# Patient Record
Sex: Female | Born: 1950 | Race: White | Hispanic: No | Marital: Married | Smoking: Never smoker
Health system: Southern US, Community
[De-identification: ages and names within clinical notes are randomized; demographics above are authoritative.]

## PROBLEM LIST (undated history)

## (undated) DIAGNOSIS — I10 Essential (primary) hypertension: Secondary | ICD-10-CM

## (undated) DIAGNOSIS — N189 Chronic kidney disease, unspecified: Secondary | ICD-10-CM

## (undated) DIAGNOSIS — I639 Cerebral infarction, unspecified: Secondary | ICD-10-CM

## (undated) HISTORY — PX: HAMMER TOE SURGERY: SHX385

## (undated) HISTORY — PX: KNEE ARTHROSCOPY: SUR90

## (undated) HISTORY — PX: TONSILLECTOMY: SUR1361

## (undated) HISTORY — PX: ABDOMINAL HYSTERECTOMY: SHX81

## (undated) HISTORY — PX: CHOLECYSTECTOMY: SHX55

---

## 2004-12-09 ENCOUNTER — Emergency Department: Payer: Self-pay | Admitting: Emergency Medicine

## 2005-06-28 ENCOUNTER — Ambulatory Visit: Payer: Self-pay | Admitting: Internal Medicine

## 2006-05-05 ENCOUNTER — Ambulatory Visit: Payer: Self-pay | Admitting: Internal Medicine

## 2006-07-03 ENCOUNTER — Ambulatory Visit: Payer: Self-pay | Admitting: Internal Medicine

## 2007-11-24 ENCOUNTER — Ambulatory Visit: Payer: Self-pay | Admitting: Internal Medicine

## 2009-03-04 ENCOUNTER — Inpatient Hospital Stay: Payer: Self-pay | Admitting: Internal Medicine

## 2009-03-16 ENCOUNTER — Ambulatory Visit: Payer: Self-pay | Admitting: Internal Medicine

## 2009-03-28 ENCOUNTER — Ambulatory Visit: Payer: Self-pay | Admitting: Urology

## 2009-07-05 ENCOUNTER — Ambulatory Visit: Payer: Self-pay | Admitting: Internal Medicine

## 2009-08-01 ENCOUNTER — Ambulatory Visit: Payer: Self-pay | Admitting: Internal Medicine

## 2009-11-14 ENCOUNTER — Ambulatory Visit: Payer: Self-pay | Admitting: Ophthalmology

## 2009-11-28 ENCOUNTER — Ambulatory Visit: Payer: Self-pay | Admitting: Ophthalmology

## 2010-04-11 ENCOUNTER — Ambulatory Visit: Payer: Self-pay | Admitting: Internal Medicine

## 2010-04-16 ENCOUNTER — Ambulatory Visit: Payer: Self-pay | Admitting: Internal Medicine

## 2010-04-19 ENCOUNTER — Ambulatory Visit: Payer: Self-pay | Admitting: Internal Medicine

## 2010-04-26 ENCOUNTER — Ambulatory Visit: Payer: Self-pay | Admitting: Internal Medicine

## 2010-05-03 ENCOUNTER — Ambulatory Visit: Payer: Self-pay | Admitting: Internal Medicine

## 2010-05-10 ENCOUNTER — Ambulatory Visit: Payer: Self-pay | Admitting: Internal Medicine

## 2010-05-17 ENCOUNTER — Ambulatory Visit: Payer: Self-pay | Admitting: Internal Medicine

## 2010-05-24 ENCOUNTER — Ambulatory Visit: Payer: Self-pay | Admitting: Internal Medicine

## 2010-05-31 ENCOUNTER — Ambulatory Visit: Payer: Self-pay | Admitting: Internal Medicine

## 2010-06-07 ENCOUNTER — Ambulatory Visit: Payer: Self-pay | Admitting: Internal Medicine

## 2010-06-13 ENCOUNTER — Ambulatory Visit: Payer: Self-pay | Admitting: Internal Medicine

## 2010-06-29 DIAGNOSIS — I83009 Varicose veins of unspecified lower extremity with ulcer of unspecified site: Secondary | ICD-10-CM | POA: Insufficient documentation

## 2010-09-06 ENCOUNTER — Ambulatory Visit: Payer: Self-pay | Admitting: Internal Medicine

## 2011-04-28 ENCOUNTER — Emergency Department: Payer: Self-pay | Admitting: *Deleted

## 2011-09-01 ENCOUNTER — Emergency Department: Payer: Self-pay | Admitting: Emergency Medicine

## 2011-09-07 ENCOUNTER — Emergency Department: Payer: Self-pay | Admitting: Internal Medicine

## 2011-10-06 ENCOUNTER — Ambulatory Visit: Payer: Self-pay | Admitting: Specialist

## 2011-11-07 HISTORY — PX: LUMBAR DISC SURGERY: SHX700

## 2011-11-25 ENCOUNTER — Encounter (HOSPITAL_COMMUNITY): Payer: Self-pay | Admitting: Pharmacy Technician

## 2011-11-26 ENCOUNTER — Encounter (HOSPITAL_COMMUNITY): Payer: Self-pay | Admitting: *Deleted

## 2011-11-27 ENCOUNTER — Other Ambulatory Visit: Payer: Self-pay

## 2011-11-27 ENCOUNTER — Encounter (HOSPITAL_COMMUNITY): Payer: Self-pay | Admitting: Anesthesiology

## 2011-11-27 ENCOUNTER — Encounter (HOSPITAL_COMMUNITY): Payer: Self-pay | Admitting: *Deleted

## 2011-11-27 ENCOUNTER — Encounter (HOSPITAL_COMMUNITY)
Admission: RE | Disposition: A | Discharge status: Home / Self Care | Payer: Self-pay | Source: Ambulatory Visit | Attending: Neurological Surgery

## 2011-11-27 ENCOUNTER — Ambulatory Visit (HOSPITAL_COMMUNITY): Payer: BC Managed Care – PPO | Admitting: Anesthesiology

## 2011-11-27 ENCOUNTER — Ambulatory Visit (HOSPITAL_COMMUNITY): Payer: BC Managed Care – PPO

## 2011-11-27 ENCOUNTER — Ambulatory Visit (HOSPITAL_COMMUNITY)
Admission: RE | Admit: 2011-11-27 | Discharge: 2011-11-27 | Disposition: A | Discharge status: Home / Self Care | Payer: BC Managed Care – PPO | Source: Ambulatory Visit | Attending: Neurological Surgery | Admitting: Neurological Surgery

## 2011-11-27 DIAGNOSIS — Z01818 Encounter for other preprocedural examination: Secondary | ICD-10-CM | POA: Insufficient documentation

## 2011-11-27 DIAGNOSIS — Z794 Long term (current) use of insulin: Secondary | ICD-10-CM | POA: Insufficient documentation

## 2011-11-27 DIAGNOSIS — E109 Type 1 diabetes mellitus without complications: Secondary | ICD-10-CM | POA: Insufficient documentation

## 2011-11-27 DIAGNOSIS — Z01812 Encounter for preprocedural laboratory examination: Secondary | ICD-10-CM | POA: Insufficient documentation

## 2011-11-27 DIAGNOSIS — I10 Essential (primary) hypertension: Secondary | ICD-10-CM | POA: Insufficient documentation

## 2011-11-27 DIAGNOSIS — Y838 Other surgical procedures as the cause of abnormal reaction of the patient, or of later complication, without mention of misadventure at the time of the procedure: Secondary | ICD-10-CM | POA: Insufficient documentation

## 2011-11-27 DIAGNOSIS — T81329A Deep disruption or dehiscence of operation wound, unspecified, initial encounter: Secondary | ICD-10-CM | POA: Insufficient documentation

## 2011-11-27 DIAGNOSIS — T8132XA Disruption of internal operation (surgical) wound, not elsewhere classified, initial encounter: Secondary | ICD-10-CM | POA: Insufficient documentation

## 2011-11-27 DIAGNOSIS — T8130XA Disruption of wound, unspecified, initial encounter: Secondary | ICD-10-CM

## 2011-11-27 DIAGNOSIS — Z01810 Encounter for preprocedural cardiovascular examination: Secondary | ICD-10-CM | POA: Insufficient documentation

## 2011-11-27 DIAGNOSIS — K08409 Partial loss of teeth, unspecified cause, unspecified class: Secondary | ICD-10-CM | POA: Insufficient documentation

## 2011-11-27 HISTORY — DX: Essential (primary) hypertension: I10

## 2011-11-27 HISTORY — PX: LUMBAR WOUND DEBRIDEMENT: SHX1988

## 2011-11-27 LAB — DIFFERENTIAL
Basophils Absolute: 0.1 10*3/uL (ref 0.0–0.1)
Basophils Relative: 1 % (ref 0–1)
Eosinophils Absolute: 0.1 10*3/uL (ref 0.0–0.7)
Eosinophils Relative: 1 % (ref 0–5)
Lymphocytes Relative: 27 % (ref 12–46)
Lymphs Abs: 2.4 10*3/uL (ref 0.7–4.0)
Monocytes Absolute: 0.6 10*3/uL (ref 0.1–1.0)
Monocytes Relative: 7 % (ref 3–12)
Neutro Abs: 5.8 10*3/uL (ref 1.7–7.7)
Neutrophils Relative %: 65 % (ref 43–77)

## 2011-11-27 LAB — GLUCOSE, CAPILLARY
Glucose-Capillary: 159 mg/dL — ABNORMAL HIGH (ref 70–99)
Glucose-Capillary: 185 mg/dL — ABNORMAL HIGH (ref 70–99)
Glucose-Capillary: 165 mg/dL — ABNORMAL HIGH (ref 70–99)
Glucose-Capillary: 168 mg/dL — ABNORMAL HIGH (ref 70–99)

## 2011-11-27 LAB — SURGICAL PCR SCREEN
MRSA, PCR: NEGATIVE
Staphylococcus aureus: NEGATIVE

## 2011-11-27 LAB — CBC
HCT: 40 % (ref 36.0–46.0)
Hemoglobin: 13.4 g/dL (ref 12.0–15.0)
MCH: 27.5 pg (ref 26.0–34.0)
MCHC: 33.5 g/dL (ref 30.0–36.0)
MCV: 82 fL (ref 78.0–100.0)
Platelets: 349 10*3/uL (ref 150–400)
RBC: 4.88 MIL/uL (ref 3.87–5.11)
RDW: 12.9 % (ref 11.5–15.5)
WBC: 9 10*3/uL (ref 4.0–10.5)

## 2011-11-27 LAB — BASIC METABOLIC PANEL
BUN: 25 mg/dL — ABNORMAL HIGH (ref 6–23)
CO2: 26 mEq/L (ref 19–32)
Calcium: 9.7 mg/dL (ref 8.4–10.5)
Chloride: 97 mEq/L (ref 96–112)
Creatinine, Ser: 0.81 mg/dL (ref 0.50–1.10)
GFR calc Af Amer: 88 mL/min — ABNORMAL LOW (ref >90)
GFR calc non Af Amer: 76 mL/min — ABNORMAL LOW (ref >90)
Glucose, Bld: 180 mg/dL — ABNORMAL HIGH (ref 70–99)
Potassium: 4.4 mEq/L (ref 3.5–5.1)
Sodium: 136 mEq/L (ref 135–145)

## 2011-11-27 SURGERY — LUMBAR WOUND DEBRIDEMENT
Anesthesia: General | Site: Back | Wound class: Dirty or Infected

## 2011-11-27 MED ORDER — ROCURONIUM BROMIDE 100 MG/10ML IV SOLN
INTRAVENOUS | Status: DC | PRN
  Administered 2011-11-27: 50 mg via INTRAVENOUS

## 2011-11-27 MED ORDER — HEMOSTATIC AGENTS (NO CHARGE) OPTIME
TOPICAL | Status: DC | PRN
  Administered 2011-11-27: 1 via TOPICAL

## 2011-11-27 MED ORDER — ONDANSETRON HCL 4 MG/2ML IJ SOLN
INTRAMUSCULAR | Status: DC | PRN
  Administered 2011-11-27: 4 mg via INTRAVENOUS

## 2011-11-27 MED ORDER — FENTANYL CITRATE 0.05 MG/ML IJ SOLN
INTRAMUSCULAR | Status: DC | PRN
  Administered 2011-11-27 (×2): 50 ug via INTRAVENOUS

## 2011-11-27 MED ORDER — INSULIN ASPART PROT & ASPART (70-30 MIX) 100 UNIT/ML ~~LOC~~ SUSP
50.0000 [IU] | Freq: Two times a day (BID) | SUBCUTANEOUS | Status: DC
  Filled 2011-11-27: qty 3

## 2011-11-27 MED ORDER — DEXTROSE 5 % IV SOLN
1.0000 g | INTRAVENOUS | Status: DC
  Filled 2011-11-27: qty 10

## 2011-11-27 MED ORDER — NEOSTIGMINE METHYLSULFATE 1 MG/ML IJ SOLN
INTRAMUSCULAR | Status: DC | PRN
  Administered 2011-11-27: 5 mg via INTRAVENOUS

## 2011-11-27 MED ORDER — HYDROCODONE-ACETAMINOPHEN 5-325 MG PO TABS
1.0000 | ORAL_TABLET | Freq: Four times a day (QID) | ORAL | Status: DC | PRN
  Administered 2011-11-27: 2 via ORAL
  Filled 2011-11-27: qty 2

## 2011-11-27 MED ORDER — MENTHOL 3 MG MT LOZG: 1.0000 | LOZENGE | OROMUCOSAL | Status: DC | PRN

## 2011-11-27 MED ORDER — DOXYCYCLINE HYCLATE 100 MG PO TABS
100.0000 mg | ORAL_TABLET | Freq: Two times a day (BID) | ORAL | Status: DC
  Filled 2011-11-27 (×2): qty 1

## 2011-11-27 MED ORDER — METOPROLOL SUCCINATE ER 50 MG PO TB24
50.0000 mg | ORAL_TABLET | Freq: Every day | ORAL | Status: DC
  Filled 2011-11-27: qty 1

## 2011-11-27 MED ORDER — ONDANSETRON HCL 4 MG/2ML IJ SOLN: 4.0000 mg | Freq: Once | INTRAMUSCULAR | Status: DC | PRN

## 2011-11-27 MED ORDER — GLYCOPYRROLATE 0.2 MG/ML IJ SOLN
INTRAMUSCULAR | Status: DC | PRN
  Administered 2011-11-27: .6 mg via INTRAVENOUS

## 2011-11-27 MED ORDER — VANCOMYCIN HCL 1000 MG IV SOLR
1000.0000 mg | INTRAVENOUS | Status: DC | PRN
  Administered 2011-11-27: 1 g via INTRAVENOUS

## 2011-11-27 MED ORDER — MORPHINE SULFATE 4 MG/ML IJ SOLN: 1.0000 mg | INTRAMUSCULAR | Status: DC | PRN

## 2011-11-27 MED ORDER — MORPHINE SULFATE 2 MG/ML IJ SOLN: 0.0500 mg/kg | INTRAMUSCULAR | Status: DC | PRN

## 2011-11-27 MED ORDER — LACTATED RINGERS IV SOLN
INTRAVENOUS | Status: DC | PRN
  Administered 2011-11-27: 10:00:00 via INTRAVENOUS

## 2011-11-27 MED ORDER — POTASSIUM CHLORIDE IN NACL 20-0.9 MEQ/L-% IV SOLN
INTRAVENOUS | Status: DC
  Filled 2011-11-27: qty 1000

## 2011-11-27 MED ORDER — PHENOL 1.4 % MT LIQD: 1.0000 | OROMUCOSAL | Status: DC | PRN

## 2011-11-27 MED ORDER — MELOXICAM 15 MG PO TABS
15.0000 mg | ORAL_TABLET | Freq: Every day | ORAL | Status: DC
  Filled 2011-11-27: qty 1

## 2011-11-27 MED ORDER — THROMBIN 5000 UNITS EX KIT
PACK | CUTANEOUS | Status: DC | PRN
  Administered 2011-11-27: 5000 [IU] via TOPICAL

## 2011-11-27 MED ORDER — SODIUM CHLORIDE 0.9 % IJ SOLN: 3.0000 mL | INTRAMUSCULAR | Status: DC | PRN

## 2011-11-27 MED ORDER — DEXTROSE 5 % IV SOLN
1.0000 g | INTRAVENOUS | Status: DC | PRN
  Administered 2011-11-27: 1 g via INTRAVENOUS

## 2011-11-27 MED ORDER — ACETAMINOPHEN 325 MG PO TABS: 650.0000 mg | ORAL_TABLET | ORAL | Status: DC | PRN

## 2011-11-27 MED ORDER — CEFAZOLIN SODIUM 1-5 GM-% IV SOLN
1.0000 g | Freq: Three times a day (TID) | INTRAVENOUS | Status: DC
  Administered 2011-11-27: 1 g via INTRAVENOUS
  Filled 2011-11-27 (×2): qty 50

## 2011-11-27 MED ORDER — PROPOFOL 10 MG/ML IV EMUL
INTRAVENOUS | Status: DC | PRN
  Administered 2011-11-27: 200 mg via INTRAVENOUS

## 2011-11-27 MED ORDER — ONDANSETRON HCL 4 MG/2ML IJ SOLN: 4.0000 mg | INTRAMUSCULAR | Status: DC | PRN

## 2011-11-27 MED ORDER — MEPERIDINE HCL 25 MG/ML IJ SOLN: 6.2500 mg | INTRAMUSCULAR | Status: DC | PRN

## 2011-11-27 MED ORDER — MUPIROCIN 2 % EX OINT
TOPICAL_OINTMENT | CUTANEOUS | Status: AC
  Filled 2011-11-27: qty 22

## 2011-11-27 MED ORDER — HYDROMORPHONE HCL PF 1 MG/ML IJ SOLN
0.2500 mg | INTRAMUSCULAR | Status: DC | PRN
  Administered 2011-11-27 (×4): 0.5 mg via INTRAVENOUS

## 2011-11-27 MED ORDER — DOCUSATE SODIUM 100 MG PO CAPS: 100.0000 mg | ORAL_CAPSULE | Freq: Every day | ORAL | Status: DC

## 2011-11-27 MED ORDER — DOCUSATE SODIUM 100 MG PO CAPS: 100.0000 mg | ORAL_CAPSULE | Freq: Two times a day (BID) | ORAL | Status: DC

## 2011-11-27 MED ORDER — ACETAMINOPHEN 650 MG RE SUPP: 650.0000 mg | RECTAL | Status: DC | PRN

## 2011-11-27 MED ORDER — SODIUM CHLORIDE 0.9 % IR SOLN
Status: DC | PRN
  Administered 2011-11-27: 09:00:00

## 2011-11-27 MED ORDER — SODIUM CHLORIDE 0.9 % IJ SOLN
3.0000 mL | Freq: Two times a day (BID) | INTRAMUSCULAR | Status: DC
  Administered 2011-11-27: 3 mL via INTRAVENOUS

## 2011-11-27 MED ORDER — BUPIVACAINE HCL (PF) 0.25 % IJ SOLN
INTRAMUSCULAR | Status: DC | PRN
  Administered 2011-11-27: 30 mL

## 2011-11-27 SURGICAL SUPPLY — 40 items
BAG DECANTER FOR FLEXI CONT (MISCELLANEOUS) ×2 IMPLANT
BENZOIN TINCTURE PRP APPL 2/3 (GAUZE/BANDAGES/DRESSINGS) ×2 IMPLANT
CANISTER SUCTION 2500CC (MISCELLANEOUS) ×2 IMPLANT
CLOSURE STERI STRIP 1/2 X4 (GAUZE/BANDAGES/DRESSINGS) ×2 IMPLANT
CLOTH BEACON ORANGE TIMEOUT ST (SAFETY) ×2 IMPLANT
DRAPE LAPAROTOMY 100X72X124 (DRAPES) ×2 IMPLANT
DRAPE POUCH INSTRU U-SHP 10X18 (DRAPES) ×2 IMPLANT
DRESSING TELFA 8X3 (GAUZE/BANDAGES/DRESSINGS) ×2 IMPLANT
DRSG OPSITE 4X5.5 SM (GAUZE/BANDAGES/DRESSINGS) ×2 IMPLANT
DURAPREP 26ML APPLICATOR (WOUND CARE) IMPLANT
DURAPREP 6ML APPLICATOR 50/CS (WOUND CARE) IMPLANT
ELECT REM PT RETURN 9FT ADLT (ELECTROSURGICAL) ×2
ELECTRODE REM PT RTRN 9FT ADLT (ELECTROSURGICAL) ×1 IMPLANT
GAUZE SPONGE 4X4 16PLY XRAY LF (GAUZE/BANDAGES/DRESSINGS) IMPLANT
GLOVE BIO SURGEON STRL SZ8 (GLOVE) ×2 IMPLANT
GLOVE ECLIPSE 7.5 STRL STRAW (GLOVE) ×8 IMPLANT
GLOVE INDICATOR 8.0 STRL GRN (GLOVE) ×4 IMPLANT
GOWN BRE IMP SLV AUR LG STRL (GOWN DISPOSABLE) IMPLANT
GOWN BRE IMP SLV AUR XL STRL (GOWN DISPOSABLE) ×2 IMPLANT
GOWN STRL REIN 2XL LVL4 (GOWN DISPOSABLE) ×4 IMPLANT
KIT BASIN OR (CUSTOM PROCEDURE TRAY) ×2 IMPLANT
KIT ROOM TURNOVER OR (KITS) ×2 IMPLANT
NEEDLE HYPO 18GX1.5 BLUNT FILL (NEEDLE) IMPLANT
NEEDLE HYPO 25X1 1.5 SAFETY (NEEDLE) ×2 IMPLANT
NEEDLE SPNL 20GX3.5 QUINCKE YW (NEEDLE) IMPLANT
NS IRRIG 1000ML POUR BTL (IV SOLUTION) ×2 IMPLANT
PACK LAMINECTOMY NEURO (CUSTOM PROCEDURE TRAY) ×2 IMPLANT
PAD ARMBOARD 7.5X6 YLW CONV (MISCELLANEOUS) ×6 IMPLANT
STRIP CLOSURE SKIN 1/2X4 (GAUZE/BANDAGES/DRESSINGS) ×2 IMPLANT
SUT VIC AB 0 CT1 18XCR BRD8 (SUTURE) ×1 IMPLANT
SUT VIC AB 0 CT1 8-18 (SUTURE) ×1
SUT VIC AB 2-0 CP2 18 (SUTURE) ×2 IMPLANT
SUT VIC AB 3-0 SH 8-18 (SUTURE) ×4 IMPLANT
SWAB CULTURE LIQ STUART DBL (MISCELLANEOUS) ×2 IMPLANT
SYR 20ML ECCENTRIC (SYRINGE) ×2 IMPLANT
SYR 3ML LL SCALE MARK (SYRINGE) IMPLANT
TOWEL OR 17X24 6PK STRL BLUE (TOWEL DISPOSABLE) ×2 IMPLANT
TOWEL OR 17X26 10 PK STRL BLUE (TOWEL DISPOSABLE) ×2 IMPLANT
TUBE ANAEROBIC SPECIMEN COL (MISCELLANEOUS) ×2 IMPLANT
WATER STERILE IRR 1000ML POUR (IV SOLUTION) ×2 IMPLANT

## 2011-11-27 NOTE — Progress Notes (Signed)
pts teeth placed back in mouth  

## 2011-11-27 NOTE — H&P (Signed)
Subjective: Patient is a 60 y.o. female admitted for I and D lumbar wound. Onset of symptoms was 2 weeks ago, gradually worsening since that time.  The pain is rated mild, and is located at the across the lower back. The pain is described as aching and occurs intermittently. The symptoms have been progressive. Symptoms are exacerbated by nothing in particular.   Past Medical History  Diagnosis Date  . Hypertension   . Diabetes mellitus     Past Surgical History  Procedure Date  . Lumbar disc surgery 11/07/11  . Abdominal hysterectomy   . Hammer toe surgery   . Cholecystectomy   . Tonsillectomy   . Knee arthroscopy     Prior to Admission medications   Medication Sig Start Date End Date Taking? Authorizing Provider  docusate sodium (COLACE) 100 MG capsule Take 100 mg by mouth daily.     Yes Historical Provider, MD  doxycycline (VIBRA-TABS) 100 MG tablet Take 100 mg by mouth 2 (two) times daily.     Yes Historical Provider, MD  HYDROcodone-acetaminophen (VICODIN) 5-500 MG per tablet Take 1-2 tablets by mouth every 6 (six) hours as needed. For pain    Yes Historical Provider, MD  insulin NPH-insulin regular (NOVOLIN 70/30) (70-30) 100 UNIT/ML injection Inject 50 Units into the skin 2 (two) times daily with a meal.     Yes Historical Provider, MD  meloxicam (MOBIC) 15 MG tablet Take 15 mg by mouth daily.     Yes Historical Provider, MD  metoprolol (TOPROL-XL) 50 MG 24 hr tablet Take 50 mg by mouth daily.     Yes Historical Provider, MD   No Known Allergies  History  Substance Use Topics  . Smoking status: Never Smoker   . Smokeless tobacco: Not on file  . Alcohol Use: No    History reviewed. No pertinent family history.   Review of Systems  Positive ROS: neg  All other systems have been reviewed and were otherwise negative with the exception of those mentioned in the HPI and as above.  Objective: Vital signs in last 24 hours: Temp:  [98 F (36.7 C)-98.1 F (36.7 C)] 98 F  (36.7 C) (11/28 0840) Pulse Rate:  [72-84] 72  (11/28 0840) Resp:  [20] 20  (11/28 0840) BP: (114-149)/(80-85) 114/80 mmHg (11/28 0840) SpO2:  [98 %-100 %] 98 % (11/28 0840) Weight:  [122.471 kg (270 lb)] 270 lb (122.471 kg) (11/28 0840)  General Appearance: Alert, cooperative, no distress, appears stated age Head: Normocephalic, without obvious abnormality, atraumatic Eyes: PERRL, conjunctiva/corneas clear, EOM's intact, fundi benign, both eyes      Ears: Normal TM's and external ear canals, both ears Throat: Lips, mucosa, and tongue normal; teeth and gums normal Neck: Supple, symmetrical, trachea midline, no adenopathy; thyroid: No enlargement/tenderness/nodules; no carotid bruit or JVD Back: Symmetric, no curvature, ROM normal, no CVA tenderness Lungs: Clear to auscultation bilaterally, respirations unlabored Heart: Regular rate and rhythm, S1 and S2 normal, no murmur, rub or gallop Abdomen: Soft, non-tender, bowel sounds active all four quadrants, no masses, no organomegaly Extremities: Extremities normal, atraumatic, no cyanosis or edema Pulses: 2+ and symmetric all extremities Skin: Skin color, texture, turgor normal, no rashes or lesions  NEUROLOGIC:   Mental status: Alert and oriented x4,  no aphasia, good attention span, fund of knowledge, and memory Motor Exam - grossly normal Sensory Exam - grossly normal Reflexes: normal Coordination - grossly normal Gait - grossly normal Balance - grossly normal Cranial Nerves: I: smell Not tested  II: visual acuity  OS: nl    OD: nl  II: visual fields Full to confrontation  II: pupils Equal, round, reactive to light  III,VII: ptosis None  III,IV,VI: extraocular muscles  Full ROM  V: mastication Normal  V: facial light touch sensation  Normal  V,VII: corneal reflex  Present  VII: facial muscle function - upper  Normal  VII: facial muscle function - lower Normal  VIII: hearing Not tested  IX: soft palate elevation  Normal    IX,X: gag reflex Present  XI: trapezius strength  5/5  XI: sternocleidomastoid strength 5/5  XI: neck flexion strength  5/5  XII: tongue strength  Normal    Data Review Lab Results  Component Value Date   WBC 9.0 11/27/2011   HGB 13.4 11/27/2011   HCT 40.0 11/27/2011   MCV 82.0 11/27/2011   PLT 349 11/27/2011   No results found for this basename: NA, K, CL, CO2, BUN, creatinine, glucose   No results found for this basename: INR, PROTIME    Assessment/Plan: Patient admitted for I and D lumbar wound. Patient has failed conservative therapy. Doxycycline foe 10 days.  I explained the condition and procedure to the patient and answered any questions.  Patient wishes to proceed with procedure as planned. Understands risks/ benefits and typical outcomes of procedure.   Farron Lafond S 11/27/2011 9:05 AM

## 2011-11-27 NOTE — Anesthesia Postprocedure Evaluation (Signed)
  Anesthesia Post-op Note  Patient: Brooke Hopkins  Procedure(s) Performed:  LUMBAR WOUND DEBRIDEMENT  Patient Location: PACU  Anesthesia Type: General  Level of Consciousness: awake, alert  and oriented  Airway and Oxygen Therapy: Patient Spontanous Breathing and Patient connected to nasal cannula oxygen  Post-op Pain: moderate  Post-op Assessment: Post-op Vital signs reviewed, Patient's Cardiovascular Status Stable, Respiratory Function Stable, Patent Airway, No signs of Nausea or vomiting and Pain level controlled  Post-op Vital Signs: Reviewed and stable  Complications: No apparent anesthesia complications

## 2011-11-27 NOTE — Anesthesia Preprocedure Evaluation (Addendum)
Anesthesia Evaluation  Patient identified by MRN, date of birth, ID band Patient awake    Reviewed: Allergy & Precautions, H&P , NPO status , Patient's Chart, lab work & pertinent test results, reviewed documented beta blocker date and time   Airway Mallampati: II TM Distance: >3 FB Neck ROM: Full    Dental  (+) Poor Dentition, Edentulous Upper and Dental Advisory Given   Pulmonary neg pulmonary ROS,    Pulmonary exam normal       Cardiovascular hypertension, Pt. on home beta blockers     Neuro/Psych Negative Neurological ROS  Negative Psych ROS   GI/Hepatic Neg liver ROS,   Endo/Other  Diabetes mellitus-, Well Controlled, Type 1, Insulin DependentMorbid obesity  Renal/GU negative Renal ROS     Musculoskeletal   Abdominal   Peds negative pediatric ROS (+)  Hematology   Anesthesia Other Findings   Reproductive/Obstetrics                          Anesthesia Physical Anesthesia Plan  ASA: III  Anesthesia Plan: General   Post-op Pain Management:    Induction: Intravenous  Airway Management Planned: Oral ETT  Additional Equipment:   Intra-op Plan:   Post-operative Plan: Extubation in OR  Informed Consent: I have reviewed the patients History and Physical, chart, labs and discussed the procedure including the risks, benefits and alternatives for the proposed anesthesia with the patient or authorized representative who has indicated his/her understanding and acceptance.   Dental advisory given  Plan Discussed with: CRNA, Surgeon and Anesthesiologist  Anesthesia Plan Comments:        Anesthesia Quick Evaluation

## 2011-11-27 NOTE — Discharge Summary (Signed)
Physician Discharge Summary  [name] MRN: [id number] DOB/AGE: [birth year] 60 y.o.  Admit date: 11/27/2011 Discharge date: 11/27/2011  Admission Diagnoses: Wound dehiscence    Discharge Diagnoses: Same   Discharged Condition: good  Hospital Course: The patient was admitted on 11/27/2011 and taken to the operating room where the patient underwent irrigation and debridement of lumbar wound. The patient tolerated the procedure well and was taken to the recovery room and then to the floor in stable condition. The hospital course was routine. There were no complications. The wound remained clean dry and intact. The patient remained afebrile with stable vital signs, and tolerated a regular diet. The patient continued to increase activities, and pain was well controlled with oral pain medications.   Consults: none  Significant Diagnostic Studies:  Results for orders placed during the hospital encounter of 11/27/11  GLUCOSE, CAPILLARY      Component Value Range   Glucose-Capillary 159 (*) 70 - 99 (mg/dL)  BASIC METABOLIC PANEL      Component Value Range   Sodium 136  135 - 145 (mEq/L)   Potassium 4.4  3.5 - 5.1 (mEq/L)   Chloride 97  96 - 112 (mEq/L)   CO2 26  19 - 32 (mEq/L)   Glucose, Bld 180 (*) 70 - 99 (mg/dL)   BUN 25 (*) 6 - 23 (mg/dL)   Creatinine, Ser 0.81  0.50 - 1.10 (mg/dL)   Calcium 9.7  8.4 - 10.5 (mg/dL)   GFR calc non Af Amer 76 (*) >90 (mL/min)   GFR calc Af Amer 88 (*) >90 (mL/min)  CBC      Component Value Range   WBC 9.0  4.0 - 10.5 (K/uL)   RBC 4.88  3.87 - 5.11 (MIL/uL)   Hemoglobin 13.4  12.0 - 15.0 (g/dL)   HCT 40.0  36.0 - 46.0 (%)   MCV 82.0  78.0 - 100.0 (fL)   MCH 27.5  26.0 - 34.0 (pg)   MCHC 33.5  30.0 - 36.0 (g/dL)   RDW 12.9  11.5 - 15.5 (%)   Platelets 349  150 - 400 (K/uL)  DIFFERENTIAL      Component Value Range   Neutrophils Relative 65  43 - 77 (%)   Neutro Abs 5.8  1.7 - 7.7 (K/uL)   Lymphocytes Relative 27  12 - 46  (%)   Lymphs Abs 2.4  0.7 - 4.0 (K/uL)   Monocytes Relative 7  3 - 12 (%)   Monocytes Absolute 0.6  0.1 - 1.0 (K/uL)   Eosinophils Relative 1  0 - 5 (%)   Eosinophils Absolute 0.1  0.0 - 0.7 (K/uL)   Basophils Relative 1  0 - 1 (%)   Basophils Absolute 0.1  0.0 - 0.1 (K/uL)  GLUCOSE, CAPILLARY      Component Value Range   Glucose-Capillary 185 (*) 70 - 99 (mg/dL)    Chest 2 View  11/27/2011  *RADIOLOGY REPORT*  Clinical Data: Incision and drainage lumbar wound, history hypertension, diabetes  CHEST - 2 VIEW  Comparison: None  Findings: Normal heart size, mediastinal contours, and pulmonary vascularity. Atherosclerotic calcification aortic arch. Lungs clear. No pulmonary infiltrate, pleural effusion or pneumothorax. No acute osseous findings.  IMPRESSION: No acute abnormalities.  Original Report Authenticated By: Burnetta Sabin, M.D.    Antibiotics:  Anti-infectives     Start     Dose/Rate Route Frequency Ordered Stop   11/27/11 0945   cefTRIAXone (ROCEPHIN) 1 g in dextrose 5 %  50 mL IVPB        1 g 100 mL/hr over 30 Minutes Intravenous To Neuro PACU 11/27/11 0939 11/28/11 0945   11/27/11 0836   50,000 units bacitracin in 0.9% normal saline 250 mL irrigation  Status:  Discontinued          As needed 11/27/11 0837 11/27/11 1037          Discharge Exam: Blood pressure 114/80, pulse 72, temperature 97.4 F (36.3 C), temperature source Oral, resp. rate 20, height 5\' 7"  (1.702 m), weight 122.471 kg (270 lb), SpO2 98.00%. Neurologic: Grossly normal  Discharge Medications:   Current Discharge Medication List    CONTINUE these medications which have NOT CHANGED   Details  docusate sodium (COLACE) 100 MG capsule Take 100 mg by mouth daily.      doxycycline (VIBRA-TABS) 100 MG tablet Take 100 mg by mouth 2 (two) times daily.      HYDROcodone-acetaminophen (VICODIN) 5-500 MG per tablet Take 1-2 tablets by mouth every 6 (six) hours as needed. For pain     insulin NPH-insulin  regular (NOVOLIN 70/30) (70-30) 100 UNIT/ML injection Inject 50 Units into the skin 2 (two) times daily with a meal.      meloxicam (MOBIC) 15 MG tablet Take 15 mg by mouth daily.      metoprolol (TOPROL-XL) 50 MG 24 hr tablet Take 50 mg by mouth daily.          Disposition: Home   Final Dx: Irrigation and debridement of lumbar wound  Discharge Orders    Future Orders Please Complete By Expires   Diet - low sodium heart healthy      Increase activity slowly      Remove dressing in 48 hours      Call MD for:  temperature >100.4      Call MD for:  persistant nausea and vomiting      Call MD for:  severe uncontrolled pain      Call MD for:  redness, tenderness, or signs of infection (pain, swelling, redness, odor or green/yellow discharge around incision site)         Follow-up Information    Follow up with Asiel Chrostowski S in 2 weeks.   Contact information:   [address] [place] [phone]           Signed: Eustace Moore 11/27/2011, 10:50 AM

## 2011-11-27 NOTE — Op Note (Signed)
11/27/2011  10:39 AM  PATIENT:  Brooke Hopkins  60 y.o. female  PRE-OPERATIVE DIAGNOSIS:  Lumbar wound dehiscence  POST-OPERATIVE DIAGNOSIS:  Same  PROCEDURE:  Irrigation and debridement of lumbar wound with primary closure  SURGEON:  Sherley Bounds, MD  ASSISTANTS: None  ANESTHESIA:   General  EBL: Minimal ml  Total I/O In: 850 [I.V.:850] Out: -   BLOOD ADMINISTERED:none  DRAINS: None   SPECIMEN:  No Specimen  INDICATION FOR PROCEDURE: Patient is a 60 year old female who underwent a lumbar laminectomy about 3 weeks ago and presents with a wound dehiscence. I placed her on doxycycline for over a week her wound continued to drain. I recommended irrigation debridement and primary closure of her wound. Patient understood the risks, benefits, and alternatives and potential outcomes and wished to proceed.  PROCEDURE DETAILS: The patient was taken to the operating room and after induction of adequate generalized endotracheal anesthesia, the patient was rolled into the prone position on the Wilson frame and all pressure points were padded. The lumbar region was cleaned and then prepped with Betadine paint and scrub and draped in the usual sterile fashion. 10 cc of local anesthesia was injected and then her old incision was incised and ellipsed out. I carried the dissection down to the lumbo sacral fascia. The fascia was opened. I found no grossly infected tissue. There was no obvious exudate.  I irrigated with saline solution containing bacitracin. I debrided the tissues. Achieved hemostasis with bipolar cautery and or unipolar cautery. I closed the subcutaneous tissues with 2-0 Vicryl and the subcuticular tissues with 3-0 Vicryl. The skin was then closed with benzoin and Steri-Strips. The drapes were removed, a sterile dressing was applied. The patient was awakened from general anesthesia and transferred to the recovery room in stable condition. At the end of the procedure all sponge,  needle and instrument counts were correct.  PLAN OF CARE: Discharge to home after PACU  PATIENT DISPOSITION:  PACU - hemodynamically stable.   Delay start of Pharmacological VTE agent (>24hrs) due to surgical blood loss or risk of bleeding:  yes

## 2011-11-27 NOTE — Transfer of Care (Signed)
Immediate Anesthesia Transfer of Care Note  Patient: Brooke Hopkins  Procedure(s) Performed:  LUMBAR WOUND DEBRIDEMENT  Patient Location: PACU  Anesthesia Type: General  Level of Consciousness: awake, alert  and oriented  Airway & Oxygen Therapy: Patient Spontanous Breathing and Patient connected to nasal cannula oxygen  Post-op Assessment: Report given to PACU RN, Post -op Vital signs reviewed and stable, Patient moving all extremities and Patient moving all extremities X 4  Post vital signs: Reviewed and stable  Complications: No apparent anesthesia complications

## 2011-11-28 MED FILL — Sodium Chloride Irrigation Soln 0.9%: Qty: 500 | Status: AC

## 2011-11-29 ENCOUNTER — Encounter (HOSPITAL_COMMUNITY): Payer: Self-pay | Admitting: Neurological Surgery

## 2011-11-30 LAB — WOUND CULTURE: Gram Stain: NONE SEEN

## 2011-12-02 LAB — ANAEROBIC CULTURE: Gram Stain: NONE SEEN

## 2011-12-10 ENCOUNTER — Encounter (HOSPITAL_BASED_OUTPATIENT_CLINIC_OR_DEPARTMENT_OTHER): Payer: BC Managed Care – PPO | Attending: General Surgery

## 2011-12-10 DIAGNOSIS — I10 Essential (primary) hypertension: Secondary | ICD-10-CM | POA: Insufficient documentation

## 2011-12-10 DIAGNOSIS — Z794 Long term (current) use of insulin: Secondary | ICD-10-CM | POA: Insufficient documentation

## 2011-12-10 DIAGNOSIS — E119 Type 2 diabetes mellitus without complications: Secondary | ICD-10-CM | POA: Insufficient documentation

## 2011-12-10 DIAGNOSIS — Z79899 Other long term (current) drug therapy: Secondary | ICD-10-CM | POA: Insufficient documentation

## 2011-12-10 DIAGNOSIS — I872 Venous insufficiency (chronic) (peripheral): Secondary | ICD-10-CM | POA: Insufficient documentation

## 2011-12-10 DIAGNOSIS — G589 Mononeuropathy, unspecified: Secondary | ICD-10-CM | POA: Insufficient documentation

## 2011-12-10 DIAGNOSIS — Y849 Medical procedure, unspecified as the cause of abnormal reaction of the patient, or of later complication, without mention of misadventure at the time of the procedure: Secondary | ICD-10-CM | POA: Insufficient documentation

## 2011-12-10 DIAGNOSIS — T8189XA Other complications of procedures, not elsewhere classified, initial encounter: Secondary | ICD-10-CM | POA: Insufficient documentation

## 2011-12-10 NOTE — H&P (Signed)
NAMERAVAE, SHOWERS NO.:  [id number]  MEDICAL RECORD NO.:  [id number]  LOCATION:  FOOT                         FACILITY:  [facility]  PHYSICIAN:  Elesa Hacker, M.D.        DATE OF BIRTH:  1951-06-02  DATE OF ADMISSION:  12/10/2011 DATE OF DISCHARGE:                             HISTORY & PHYSICAL   CHIEF COMPLAINT:  Wound in back.  HISTORY OF PRESENT ILLNESS:  This patient underwent back surgery on November 07, 2011, and re-exploration of the wound on November 27, 2011. The wound has failed to heal completely.  There is no pain or swelling or particular tenderness.  No foul odor.  She has been bathing and no particular treatment to the wound.  PAST MEDICAL HISTORY:  She has had type 2 diabetes for more than 10 years, hypertension, some neuropathy, and some venous insufficiency.  SOCIAL HISTORY:  Cigarettes, none for 20 plus years.  Alcohol, none.  MEDICATIONS:  Metoprolol, doxycycline, meloxicam, hydrocodone, and Humulin insulin.  ALLERGIES:  None.  PAST SURGICAL HISTORY:  Back surgery as above, cholecystectomy, hysterectomy, right knee arthroscopy, and operation on the right second toe.  REVIEW OF SYSTEMS:  Otherwise, essentially negative.  PHYSICAL EXAMINATION:  VITAL SIGNS:  Temperature 98, pulse 78, respirations 18, blood pressure 185/103, glucose is 170. GENERAL APPEARANCE:  Well developed, well nourished, in no distress. EYES, EARS, NOSE, THROAT, CHEST, AND HEART:  Essentially negative. ABDOMEN:  Not examined. EXTREMITIES:  Not examined. BACK:  Reveals a 4-cm long wound which is healed over much of its length and the bottom 2 cm of the wound.  There are 2 small openings.  There is a small cavity underneath.  The skin bridge was easily divided, making 1 small cavity, approximately 0.3-cm deep with a width of approximately half a cm.  IMPRESSION:  Postoperative wound, almost healed.  PLAN OF TREATMENT:  Pack with silver alginate 3 times a week  after showering.  I will see the patient next week.  I expect that the wound healing should proceed.     Elesa Hacker, M.D.     RA/MEDQ  D:  12/10/2011  T:  12/10/2011  Job:  [id number]  cc:   Eustace Moore, MD Andrey Farmer, MD

## 2011-12-11 ENCOUNTER — Encounter (HOSPITAL_BASED_OUTPATIENT_CLINIC_OR_DEPARTMENT_OTHER): Payer: BC Managed Care – PPO

## 2012-01-02 ENCOUNTER — Encounter (HOSPITAL_BASED_OUTPATIENT_CLINIC_OR_DEPARTMENT_OTHER): Payer: 59 | Attending: Internal Medicine

## 2012-01-02 DIAGNOSIS — Z794 Long term (current) use of insulin: Secondary | ICD-10-CM | POA: Insufficient documentation

## 2012-01-02 DIAGNOSIS — Z79899 Other long term (current) drug therapy: Secondary | ICD-10-CM | POA: Insufficient documentation

## 2012-01-02 DIAGNOSIS — E119 Type 2 diabetes mellitus without complications: Secondary | ICD-10-CM | POA: Insufficient documentation

## 2012-01-02 DIAGNOSIS — Y838 Other surgical procedures as the cause of abnormal reaction of the patient, or of later complication, without mention of misadventure at the time of the procedure: Secondary | ICD-10-CM | POA: Insufficient documentation

## 2012-01-02 DIAGNOSIS — T8189XA Other complications of procedures, not elsewhere classified, initial encounter: Secondary | ICD-10-CM | POA: Insufficient documentation

## 2012-01-02 DIAGNOSIS — G589 Mononeuropathy, unspecified: Secondary | ICD-10-CM | POA: Insufficient documentation

## 2012-01-02 DIAGNOSIS — I872 Venous insufficiency (chronic) (peripheral): Secondary | ICD-10-CM | POA: Insufficient documentation

## 2012-01-02 DIAGNOSIS — I10 Essential (primary) hypertension: Secondary | ICD-10-CM | POA: Insufficient documentation

## 2012-01-31 ENCOUNTER — Encounter (HOSPITAL_BASED_OUTPATIENT_CLINIC_OR_DEPARTMENT_OTHER): Payer: 59 | Attending: General Surgery

## 2012-01-31 DIAGNOSIS — Y838 Other surgical procedures as the cause of abnormal reaction of the patient, or of later complication, without mention of misadventure at the time of the procedure: Secondary | ICD-10-CM | POA: Insufficient documentation

## 2012-01-31 DIAGNOSIS — Z794 Long term (current) use of insulin: Secondary | ICD-10-CM | POA: Insufficient documentation

## 2012-01-31 DIAGNOSIS — I10 Essential (primary) hypertension: Secondary | ICD-10-CM | POA: Insufficient documentation

## 2012-01-31 DIAGNOSIS — T8189XA Other complications of procedures, not elsewhere classified, initial encounter: Secondary | ICD-10-CM | POA: Insufficient documentation

## 2012-01-31 DIAGNOSIS — G589 Mononeuropathy, unspecified: Secondary | ICD-10-CM | POA: Insufficient documentation

## 2012-01-31 DIAGNOSIS — Z79899 Other long term (current) drug therapy: Secondary | ICD-10-CM | POA: Insufficient documentation

## 2012-01-31 DIAGNOSIS — E119 Type 2 diabetes mellitus without complications: Secondary | ICD-10-CM | POA: Insufficient documentation

## 2012-02-06 ENCOUNTER — Encounter (HOSPITAL_BASED_OUTPATIENT_CLINIC_OR_DEPARTMENT_OTHER): Payer: BC Managed Care – PPO

## 2012-02-19 NOTE — Progress Notes (Signed)
[facility]  NAME:  Brooke Hopkins, Brooke Hopkins                  ACCOUNT NO.:  MEDICAL RECORD NO.:  [id number]      DATE OF BIRTH:  [birth year]  PHYSICIAN:  Elesa Hacker, M.D.         VISIT DATE:  02/18/2012                                  OFFICE VISIT   To Whom It May Concern:  [name] underwent back surgery early November 2012.  The wound had to be reexplored and debrided late November 2012.  She was treated by her surgeon until the middle of December when she was sent to [place].  At that point, the wound was a complete failure of healing, and we thought that the [facility] was the only modality which would allow for healing.  She had the [facility] on from December 11 through February 12 and has had quite a good response to [facility] treatment.     Elesa Hacker, M.D.     RA/MEDQ  D:  02/18/2012  T:  02/18/2012  Job:  [id number]

## 2012-03-03 ENCOUNTER — Encounter (HOSPITAL_BASED_OUTPATIENT_CLINIC_OR_DEPARTMENT_OTHER): Payer: 59 | Attending: General Surgery

## 2012-03-03 DIAGNOSIS — Y838 Other surgical procedures as the cause of abnormal reaction of the patient, or of later complication, without mention of misadventure at the time of the procedure: Secondary | ICD-10-CM | POA: Insufficient documentation

## 2012-03-03 DIAGNOSIS — T8189XA Other complications of procedures, not elsewhere classified, initial encounter: Secondary | ICD-10-CM | POA: Insufficient documentation

## 2012-03-11 ENCOUNTER — Ambulatory Visit: Payer: Self-pay | Admitting: Internal Medicine

## 2012-03-31 ENCOUNTER — Encounter (HOSPITAL_BASED_OUTPATIENT_CLINIC_OR_DEPARTMENT_OTHER): Payer: 59 | Attending: General Surgery

## 2012-03-31 DIAGNOSIS — T8189XA Other complications of procedures, not elsewhere classified, initial encounter: Secondary | ICD-10-CM | POA: Insufficient documentation

## 2012-03-31 DIAGNOSIS — Y838 Other surgical procedures as the cause of abnormal reaction of the patient, or of later complication, without mention of misadventure at the time of the procedure: Secondary | ICD-10-CM | POA: Insufficient documentation

## 2012-04-07 NOTE — Progress Notes (Signed)
[facility]  NAME:  Brooke Hopkins, Brooke Hopkins             ACCOUNT NO.:  [id number]  MEDICAL RECORD NO.:  [id number]      DATE OF BIRTH:  05-01-1951  PHYSICIAN:  Elesa Hacker, M.D.              VISIT DATE:                                  OFFICE VISIT   ADMISSION DIAGNOSIS:  This patient underwent back surgery on November 07, 2011, re-exploration of wound on November 27, 2011, although the wound has failed to heal completely over the course of that time.  COURSE IN CLINIC:  The patient was treated with some Santyl then [facility] was placed.  When the wound was almost healed, it was stalled out for awhile and we finished treatment with Oasis applications.  At the time of discharge, the patient is well healed.  RECOMMENDATIONS:  See [place] p.r.n.     Elesa Hacker, M.D.     RA/MEDQ  D:  04/07/2012  T:  04/07/2012  Job:  [id number]

## 2013-05-21 ENCOUNTER — Ambulatory Visit: Payer: Self-pay

## 2014-07-26 ENCOUNTER — Ambulatory Visit: Payer: Self-pay | Admitting: Ophthalmology

## 2014-08-02 ENCOUNTER — Ambulatory Visit: Payer: Self-pay | Admitting: Ophthalmology

## 2014-08-02 DIAGNOSIS — H2510 Age-related nuclear cataract, unspecified eye: Secondary | ICD-10-CM | POA: Diagnosis not present

## 2014-08-05 DIAGNOSIS — G629 Polyneuropathy, unspecified: Secondary | ICD-10-CM | POA: Insufficient documentation

## 2014-09-01 ENCOUNTER — Ambulatory Visit: Payer: Self-pay

## 2014-09-01 DIAGNOSIS — Z1231 Encounter for screening mammogram for malignant neoplasm of breast: Secondary | ICD-10-CM | POA: Diagnosis not present

## 2014-09-01 DIAGNOSIS — R928 Other abnormal and inconclusive findings on diagnostic imaging of breast: Secondary | ICD-10-CM | POA: Diagnosis not present

## 2014-09-06 ENCOUNTER — Ambulatory Visit: Payer: Self-pay

## 2014-09-06 DIAGNOSIS — R928 Other abnormal and inconclusive findings on diagnostic imaging of breast: Secondary | ICD-10-CM | POA: Diagnosis not present

## 2014-09-08 ENCOUNTER — Ambulatory Visit: Payer: Self-pay

## 2014-09-08 DIAGNOSIS — D249 Benign neoplasm of unspecified breast: Secondary | ICD-10-CM | POA: Diagnosis not present

## 2014-09-08 DIAGNOSIS — R928 Other abnormal and inconclusive findings on diagnostic imaging of breast: Secondary | ICD-10-CM | POA: Diagnosis not present

## 2014-09-12 DIAGNOSIS — E785 Hyperlipidemia, unspecified: Secondary | ICD-10-CM | POA: Insufficient documentation

## 2014-09-12 DIAGNOSIS — N3941 Urge incontinence: Secondary | ICD-10-CM | POA: Diagnosis not present

## 2014-09-12 DIAGNOSIS — I10 Essential (primary) hypertension: Secondary | ICD-10-CM | POA: Diagnosis not present

## 2014-09-12 DIAGNOSIS — E119 Type 2 diabetes mellitus without complications: Secondary | ICD-10-CM | POA: Diagnosis not present

## 2014-09-12 DIAGNOSIS — E669 Obesity, unspecified: Secondary | ICD-10-CM | POA: Diagnosis not present

## 2014-09-12 LAB — PATHOLOGY REPORT

## 2014-09-28 ENCOUNTER — Ambulatory Visit: Payer: Self-pay

## 2014-09-28 DIAGNOSIS — E119 Type 2 diabetes mellitus without complications: Secondary | ICD-10-CM | POA: Diagnosis not present

## 2014-09-28 DIAGNOSIS — Z7189 Other specified counseling: Secondary | ICD-10-CM | POA: Diagnosis not present

## 2014-09-29 ENCOUNTER — Ambulatory Visit: Payer: Self-pay

## 2014-09-29 DIAGNOSIS — Z7189 Other specified counseling: Secondary | ICD-10-CM | POA: Diagnosis not present

## 2014-09-29 DIAGNOSIS — E119 Type 2 diabetes mellitus without complications: Secondary | ICD-10-CM | POA: Diagnosis not present

## 2014-10-30 ENCOUNTER — Ambulatory Visit: Payer: Self-pay

## 2015-01-30 DIAGNOSIS — I639 Cerebral infarction, unspecified: Secondary | ICD-10-CM | POA: Insufficient documentation

## 2015-01-30 DIAGNOSIS — I6381 Other cerebral infarction due to occlusion or stenosis of small artery: Secondary | ICD-10-CM | POA: Insufficient documentation

## 2015-02-10 DIAGNOSIS — E1165 Type 2 diabetes mellitus with hyperglycemia: Secondary | ICD-10-CM | POA: Diagnosis not present

## 2015-02-10 DIAGNOSIS — E1129 Type 2 diabetes mellitus with other diabetic kidney complication: Secondary | ICD-10-CM | POA: Diagnosis not present

## 2015-02-10 DIAGNOSIS — Z794 Long term (current) use of insulin: Secondary | ICD-10-CM | POA: Diagnosis not present

## 2015-02-10 DIAGNOSIS — R809 Proteinuria, unspecified: Secondary | ICD-10-CM | POA: Diagnosis not present

## 2015-02-18 ENCOUNTER — Inpatient Hospital Stay: Payer: Self-pay | Admitting: Internal Medicine

## 2015-02-18 DIAGNOSIS — Z90710 Acquired absence of both cervix and uterus: Secondary | ICD-10-CM | POA: Diagnosis not present

## 2015-02-18 DIAGNOSIS — I10 Essential (primary) hypertension: Secondary | ICD-10-CM | POA: Diagnosis not present

## 2015-02-18 DIAGNOSIS — N393 Stress incontinence (female) (male): Secondary | ICD-10-CM | POA: Diagnosis present

## 2015-02-18 DIAGNOSIS — E1121 Type 2 diabetes mellitus with diabetic nephropathy: Secondary | ICD-10-CM | POA: Diagnosis present

## 2015-02-18 DIAGNOSIS — R600 Localized edema: Secondary | ICD-10-CM | POA: Diagnosis not present

## 2015-02-18 DIAGNOSIS — Z8249 Family history of ischemic heart disease and other diseases of the circulatory system: Secondary | ICD-10-CM | POA: Diagnosis not present

## 2015-02-18 DIAGNOSIS — Z794 Long term (current) use of insulin: Secondary | ICD-10-CM | POA: Diagnosis not present

## 2015-02-18 DIAGNOSIS — Z7982 Long term (current) use of aspirin: Secondary | ICD-10-CM | POA: Diagnosis not present

## 2015-02-18 DIAGNOSIS — I878 Other specified disorders of veins: Secondary | ICD-10-CM | POA: Diagnosis present

## 2015-02-18 DIAGNOSIS — M1990 Unspecified osteoarthritis, unspecified site: Secondary | ICD-10-CM | POA: Diagnosis present

## 2015-02-18 DIAGNOSIS — L03116 Cellulitis of left lower limb: Secondary | ICD-10-CM | POA: Diagnosis present

## 2015-02-18 DIAGNOSIS — Z833 Family history of diabetes mellitus: Secondary | ICD-10-CM | POA: Diagnosis not present

## 2015-02-18 DIAGNOSIS — I639 Cerebral infarction, unspecified: Secondary | ICD-10-CM | POA: Diagnosis not present

## 2015-02-18 DIAGNOSIS — Z87891 Personal history of nicotine dependence: Secondary | ICD-10-CM | POA: Diagnosis not present

## 2015-02-18 DIAGNOSIS — I638 Other cerebral infarction: Secondary | ICD-10-CM | POA: Diagnosis not present

## 2015-02-18 DIAGNOSIS — E785 Hyperlipidemia, unspecified: Secondary | ICD-10-CM | POA: Diagnosis present

## 2015-02-18 DIAGNOSIS — I6522 Occlusion and stenosis of left carotid artery: Secondary | ICD-10-CM | POA: Diagnosis not present

## 2015-02-18 DIAGNOSIS — E1140 Type 2 diabetes mellitus with diabetic neuropathy, unspecified: Secondary | ICD-10-CM | POA: Diagnosis present

## 2015-02-18 DIAGNOSIS — Z825 Family history of asthma and other chronic lower respiratory diseases: Secondary | ICD-10-CM | POA: Diagnosis not present

## 2015-02-18 DIAGNOSIS — Z9049 Acquired absence of other specified parts of digestive tract: Secondary | ICD-10-CM | POA: Diagnosis present

## 2015-02-18 DIAGNOSIS — Z841 Family history of disorders of kidney and ureter: Secondary | ICD-10-CM | POA: Diagnosis not present

## 2015-02-18 DIAGNOSIS — Z79899 Other long term (current) drug therapy: Secondary | ICD-10-CM | POA: Diagnosis not present

## 2015-02-18 DIAGNOSIS — E118 Type 2 diabetes mellitus with unspecified complications: Secondary | ICD-10-CM | POA: Diagnosis not present

## 2015-03-03 DIAGNOSIS — E780 Pure hypercholesterolemia: Secondary | ICD-10-CM | POA: Diagnosis not present

## 2015-03-03 DIAGNOSIS — I10 Essential (primary) hypertension: Secondary | ICD-10-CM | POA: Diagnosis not present

## 2015-03-03 DIAGNOSIS — I639 Cerebral infarction, unspecified: Secondary | ICD-10-CM | POA: Diagnosis not present

## 2015-03-03 DIAGNOSIS — E1159 Type 2 diabetes mellitus with other circulatory complications: Secondary | ICD-10-CM | POA: Diagnosis not present

## 2015-04-19 DIAGNOSIS — I639 Cerebral infarction, unspecified: Secondary | ICD-10-CM | POA: Diagnosis not present

## 2015-04-19 DIAGNOSIS — E119 Type 2 diabetes mellitus without complications: Secondary | ICD-10-CM | POA: Diagnosis not present

## 2015-04-19 DIAGNOSIS — E785 Hyperlipidemia, unspecified: Secondary | ICD-10-CM | POA: Diagnosis not present

## 2015-04-19 DIAGNOSIS — I10 Essential (primary) hypertension: Secondary | ICD-10-CM | POA: Diagnosis not present

## 2015-04-20 DIAGNOSIS — B351 Tinea unguium: Secondary | ICD-10-CM | POA: Diagnosis not present

## 2015-04-20 DIAGNOSIS — E1129 Type 2 diabetes mellitus with other diabetic kidney complication: Secondary | ICD-10-CM | POA: Diagnosis not present

## 2015-04-20 DIAGNOSIS — E1165 Type 2 diabetes mellitus with hyperglycemia: Secondary | ICD-10-CM | POA: Diagnosis not present

## 2015-04-20 DIAGNOSIS — E1140 Type 2 diabetes mellitus with diabetic neuropathy, unspecified: Secondary | ICD-10-CM | POA: Diagnosis not present

## 2015-04-20 DIAGNOSIS — E1142 Type 2 diabetes mellitus with diabetic polyneuropathy: Secondary | ICD-10-CM | POA: Diagnosis not present

## 2015-04-20 DIAGNOSIS — L97522 Non-pressure chronic ulcer of other part of left foot with fat layer exposed: Secondary | ICD-10-CM | POA: Diagnosis not present

## 2015-04-20 DIAGNOSIS — Z794 Long term (current) use of insulin: Secondary | ICD-10-CM | POA: Diagnosis not present

## 2015-04-22 NOTE — Op Note (Signed)
PATIENT NAME:  Brooke Hopkins, Brooke Hopkins MR#:  [id number] DATE OF BIRTH:  [birth year]  DATE OF PROCEDURE:  08/02/2014  PREOPERATIVE DIAGNOSIS: Visually significant cataract of the right eye.   POSTOPERATIVE DIAGNOSIS: Visually significant cataract of the right eye.   OPERATIVE PROCEDURE: Cataract extraction by phacoemulsification with implant of intraocular lens to right eye.   SURGEON: Birder Robson, MD.   ANESTHESIA:  1. Managed anesthesia care.  2. Topical tetracaine drops followed by 2% Xylocaine jelly applied in the preoperative holding area.   COMPLICATIONS: None.   TECHNIQUE:  Stop and chop.  DESCRIPTION OF PROCEDURE: The patient was examined and consented in the preoperative holding area where the aforementioned topical anesthesia was applied to the right eye and then brought back to the operating room where the right eye was prepped and draped in the usual sterile ophthalmic fashion and a lid speculum was placed. A paracentesis was created with the side port blade and the anterior chamber was filled with viscoelastic. A near clear corneal incision was performed with the steel keratome. A continuous curvilinear capsulorrhexis was performed with a cystotome followed by the capsulorrhexis forceps. Hydrodissection and hydrodelineation were carried out with BSS on a blunt cannula. The lens was removed in a stop and chop technique and the remaining cortical material was removed with the irrigation-aspiration handpiece. The capsular bag was inflated with viscoelastic and the Alcon SN60WF 20.5-diopter lens, serial number [id number] was placed in the capsular bag without complication. The remaining viscoelastic was removed from the eye with the irrigation-aspiration handpiece. The wounds were hydrated. The anterior chamber was flushed with Miostat and the eye was inflated to physiologic pressure. 0.1 mL of cefuroxime concentration 10 mg/mL was placed in the anterior chamber. The wounds were found to be  water tight. The eye was dressed with Vigamox. The patient was given protective glasses to wear throughout the day and a shield with which to sleep tonight. The patient was also given drops with which to begin a drop regimen today and will follow-up with me in 1 day.    ____________________________ Livingston Diones. Liller Yohn, MD wlp:jh D: 08/02/2014 22:47:45 ET T: 08/03/2014 02:55:11 ET JOB#: [id number]  cc: Carnita Golob L. Maddisyn Hegwood, MD, <Dictator> Livingston Diones Ashya Nicolaisen MD ELECTRONICALLY SIGNED 08/03/2014 16:34

## 2015-04-30 NOTE — Discharge Summary (Signed)
PATIENT NAME:  Brooke Hopkins, Brooke Hopkins MR#:  [id number] DATE OF BIRTH:  27-May-1951  DATE OF ADMISSION:  02/18/2015 DATE OF DISCHARGE:  02/20/2015  PRIMARY CARE PHYSICIAN: Cheral Marker. Ola Spurr, MD  PRESENTING COMPLAINT: Left-sided numbness and weakness.   DISCHARGE DIAGNOSES:  1.  Acute stroke syndrome with left lower extremity weakness with MRI positive for right thalamic infarct. 2.  Accelerated hypertension. 3.  Morbid obesity. 4.  Chronic venous stasis with peripheral edema and left lower extremity cellulitis. 5.  Type 2 diabetes. 6.  Diabetic nephropathy.  DISCHARGE DIAGNOSES: 1.  Acute right thalamic infarct.  2.  Left lower extremity cellulitis with chronic peripheral venous stasis changes bilaterally.  3.  Morbid obesity.  4.  Diabetic neuropathy.  5.  Type 2 diabetes.  6.  Hypertension.   CONDITION ON DISCHARGE:  Fair.  CODE STATUS:  Full code.  MEDICATIONS: 1.  Lisinopril 40 mg p.o. daily.  2.  Metoprolol ER 50 mg daily.  3.  Aspirin 81 mg daily.  4.  Plavix 75 mg daily.  5.  Humulin 70/30, 65 units b.i.d.  6.  Metformin 500 mg 1 tablet daily.  7.  Lasix 20 mg daily.  8.  Atorvastatin 20 mg daily.  9.  Amoxicillin 875 mg b.i.d.  10.  Protonix 40 mg daily.  11.  Hydralazine 25 mg 4 times a day.   Physical therapy at rehabilitation.   DIET:  A 2-gram sodium, ADA 1800 calorie diet.   FOLLOWUP:  With primary care physician once discharged from rehabilitation.  CONSULTATIONS: Physical therapy.   LABORATORY DATA: White count is 10.0, H and H is 11 and 33.3. Creatinine is 1.1, sodium is 142.   IMAGING:   1.  Ultrasound carotid Doppler indicates moderate heterogeneous and calcified plaque, with no hemodynamically significant stenosis noted by duplex criteria. 2.  Echocardiogram Doppler showed EF of 65%-70%. Mild aortic valve stenosis. Normal global left ventricular systolic function.  3.  MRI of the brain shows acute infarct right thalamus and proximal mid brain mild  chronic microvascular ischemic changes.   BRIEF SUMMARY OF HOSPITAL COURSE: Brooke Hopkins is a 64 year old obese Caucasian female with past medical history of diabetes, hypertension, comes in with left-sided weakness and numbness. She was admitted with:  1.  Acute stroke syndrome, was found to have right thalamic infarct. She was already on aspirin, Plavix was added. The patient's symptoms improved slowly.  Physical therapy saw the patient and recommended rehabilitation. She will be going to [facility] today.  2.  Accelerated hypertension. Continued home medications. Hydralazine was added.  3.  Morbid obesity. The patient was counselled on weight reduction.   4.  Diabetic nephropathy. Creatinine is stable.  5.  Type 2 diabetes, on insulin 70/30 and metformin.  6.  Venous stasis with peripheral edema with left lower extremity cellulitis, added Augmentin. She received IV Lasix b.i.d., diuresed well. She is back on her p.o. Lasix 20 mg at discharge.   Hospital stay otherwise remained stable. She remained a full code.   TIME SPENT: 40 minutes.    ____________________________ Hart Rochester Posey Pronto, MD sap:LT D: 02/20/2015 15:14:05 ET T: 02/20/2015 21:42:07 ET JOB#: [id number]  cc: Smrithi Pigford A. Posey Pronto, MD, <Dictator> Cheral Marker. Ola Spurr, MD  Ilda Basset MD ELECTRONICALLY SIGNED 02/28/2015 17:34

## 2015-04-30 NOTE — H&P (Signed)
PATIENT NAME:  Brooke Hopkins, SCRIPTURE MR#:  [id number] DATE OF BIRTH:  August 21, 1951  DATE OF ADMISSION:  02/18/2015  REFERRING PHYSICIAN: Latina Craver, MD  FAMILY PHYSICIAN: Cheral Marker. Ola Spurr, MD   REASON FOR ADMISSION: Acute stroke syndrome.   HISTORY OF PRESENT ILLNESS: The patient is a 64 year old female with a long-standing history of morbid obesity, hypertension, diabetes, and peripheral edema. Presents to the Emergency Room with acute onset left-sided numbness associated with left lower extremity weakness, unable to get out of her recliner because of her left lower extremity weakness. In the Emergency Room, the patient was noted to be malignantly hypertensive. She was noted to have some left lower extremity weakness on exam. Her initial head CT was unremarkable. She is now admitted for further evaluation. She continues to have lower extremity weakness and left-sided paresthesias.   PAST MEDICAL HISTORY: 1.  Morbid obesity.  2.  Benign hypertension.  3.  Type 2 diabetes.  4.  Diabetic nephropathy.  5.  Diabetic neuropathy.  6.  Venous stasis with peripheral edema.  7.  History of venous stasis ulceration.  8.  Stress urinary incontinence.  9.  Hyperlipidemia.  10.  Osteoarthritis.  11.  Status post hysterectomy.  12.  Status post cholecystectomy.   MEDICATIONS: 1.  Aspirin 81 mg p.o. daily.  2.  Gabapentin 300 mg p.o. t.i.d.  3.  Lasix 20 mg p.o. daily.  4.  Humulin 70/30 at 65 units subcutaneous b.i.d.  5.  Lisinopril 40 mg p.o. daily.  6.  Toprol-XL 50 mg p.o. daily.  7.  Lipitor 20 mg p.o. at bedtime.   ALLERGIES: No known drug allergies.   SOCIAL HISTORY: The patient is married. Quit smoking 30 years ago. Denies alcohol abuse.   FAMILY HISTORY: Positive for coronary artery disease, hypertension, COPD, kidney failure, and diabetes.  REVIEW OF SYSTEMS: CONSTITUTIONAL: No fever or change in weight.  EYES: No blurred or double vision. No glaucoma.  ENT: No tinnitus or  hearing loss. No nasal discharge or bleeding. No difficulty swallowing.  RESPIRATORY: No cough or wheezing. Denies hemoptysis. No painful respiration.  CARDIOVASCULAR: No chest pain or orthopnea. No palpitations.  GASTROINTESTINAL: No nausea, vomiting, or diarrhea. No abdominal pain. No change in bowel habits.  GENITOURINARY: No dysuria or hematuria. Does have incontinence.  ENDOCRINE: No polyuria or polydipsia. No heat or cold intolerance.  HEMATOLOGIC: The patient denies anemia, easy bruising, or bleeding.  LYMPHATIC: No swollen glands.  MUSCULOSKELETAL: The patient has pain in her back, but denies pain in her neck, shoulders, or hips. Does have occasional knee pain. No gout.  NEUROLOGIC: No migraines. Denies seizures. No previous stroke.  PSYCHIATRIC: The patient denies anxiety, insomnia, or depression.   PHYSICAL EXAMINATION: GENERAL: The patient is obese, in no acute distress.  VITAL SIGNS: Currently remarkable for an initial blood pressure of 150/120, now with a blood pressure of 162/62 with a heart rate of 75, respiratory rate of 16, temperature of 97.7, sat of 95% on room air.  HEENT: Normocephalic, atraumatic. Pupils equally round and reactive to light and accommodation. Extraocular movements are intact. Sclerae are nonicteric. Conjunctivae are clear.  OROPHARYNX: Clear.  NECK: Supple without JVD. No adenopathy or thyromegaly is noted.  LUNGS: Clear to auscultation and percussion without wheezes, rales, or rhonchi. No dullness. Respiratory effort is normal.  CARDIAC: Regular rate and rhythm. Normal S1, S2. No significant rubs, murmurs, or gallops. PMI is nondisplaced. Chest wall is nontender.  ABDOMEN: Soft, nontender, with normoactive bowel sounds. No  organomegaly or masses were appreciated. No hernias or bruits were noted.  EXTREMITIES: Revealed 2+ edema with stasis changes. Distal pulses were 1+ bilaterally. No clubbing or cyanosis.  NEUROLOGIC: Revealed cranial nerves II through  XII grossly intact. Deep tendon reflexes were symmetric. Sensory exam revealed a stocking glove neuropathy. Motor exam revealed left lower extremity weakness at 4/5 strength.  PSYCHIATRIC: Revealed a patient who was alert and oriented to person, place, and time. She was cooperative and used good judgment.   LABORATORY DATA: Troponin was less than 0.02. Glucose 173 with a BUN of 18, creatinine of 1.32 with a sodium of 139 and a potassium of 4.8 with a GFR of 43. Her white count was 9.3 with a hemoglobin of 13.0. Her pro time was 12.6 with an INR of 0.9. Urinalysis revealed mild proteinuria with 3+ bacteria.  Chest x-ray revealed no active disease. Her head CT revealed a chronic left basal ganglia lacunar infarct, but no acute abnormalities were noted. Her EKG revealed sinus rhythm with no acute ischemic changes.   ASSESSMENT: 1.  Acute stroke syndrome with left lower extremity weakness.  2.  Accelerated hypertension.  3.  Morbid obesity.  4.  Diabetic nephropathy.  5.  Diabetic neuropathy.  6.  Type 2 diabetes with insulin requirement.  7.  Venous stasis with peripheral edema.   PLAN: The patient will be admitted to telemetry with aspirin and Plavix. We will monitor her sugars and add sliding scale insulin as needed. Vital signs and neurochecks q.4 hours. We will obtain a brain MRI as well as carotid Dopplers and an echocardiogram. Will consult physical therapy and care management. Follow up routine labs in the morning. Further treatment and evaluation will depend upon the patient's progress.  TOTAL TIME SPENT ON THIS PATIENT: 50 minutes.    ____________________________ Leonie Douglas Doy Hutching, MD jds:sw D: 02/18/2015 11:23:53 ET T: 02/18/2015 11:36:19 ET JOB#: [id number]  cc: Leonie Douglas. Doy Hutching, MD, <Dictator> Cheral Marker. Ola Spurr, MD Arnice Vanepps Lennice Sites MD ELECTRONICALLY SIGNED 02/18/2015 18:33

## 2015-05-03 DIAGNOSIS — I10 Essential (primary) hypertension: Secondary | ICD-10-CM | POA: Diagnosis not present

## 2015-05-03 DIAGNOSIS — I639 Cerebral infarction, unspecified: Secondary | ICD-10-CM | POA: Diagnosis not present

## 2015-05-03 DIAGNOSIS — R600 Localized edema: Secondary | ICD-10-CM | POA: Diagnosis not present

## 2015-05-03 DIAGNOSIS — E785 Hyperlipidemia, unspecified: Secondary | ICD-10-CM | POA: Diagnosis not present

## 2015-05-18 DIAGNOSIS — L97522 Non-pressure chronic ulcer of other part of left foot with fat layer exposed: Secondary | ICD-10-CM | POA: Diagnosis not present

## 2015-05-18 DIAGNOSIS — E1140 Type 2 diabetes mellitus with diabetic neuropathy, unspecified: Secondary | ICD-10-CM | POA: Diagnosis not present

## 2015-05-18 DIAGNOSIS — B351 Tinea unguium: Secondary | ICD-10-CM | POA: Diagnosis not present

## 2015-05-22 DIAGNOSIS — E119 Type 2 diabetes mellitus without complications: Secondary | ICD-10-CM | POA: Diagnosis not present

## 2015-05-22 DIAGNOSIS — I10 Essential (primary) hypertension: Secondary | ICD-10-CM | POA: Diagnosis not present

## 2015-05-22 DIAGNOSIS — E785 Hyperlipidemia, unspecified: Secondary | ICD-10-CM | POA: Diagnosis not present

## 2015-05-22 DIAGNOSIS — E669 Obesity, unspecified: Secondary | ICD-10-CM | POA: Diagnosis not present

## 2015-06-12 DIAGNOSIS — I10 Essential (primary) hypertension: Secondary | ICD-10-CM | POA: Diagnosis not present

## 2015-06-12 DIAGNOSIS — R600 Localized edema: Secondary | ICD-10-CM | POA: Diagnosis not present

## 2015-07-06 DIAGNOSIS — R609 Edema, unspecified: Secondary | ICD-10-CM | POA: Diagnosis not present

## 2015-07-06 DIAGNOSIS — E1129 Type 2 diabetes mellitus with other diabetic kidney complication: Secondary | ICD-10-CM | POA: Diagnosis not present

## 2015-07-06 DIAGNOSIS — E1142 Type 2 diabetes mellitus with diabetic polyneuropathy: Secondary | ICD-10-CM | POA: Diagnosis not present

## 2015-07-06 DIAGNOSIS — Z794 Long term (current) use of insulin: Secondary | ICD-10-CM | POA: Diagnosis not present

## 2015-07-06 DIAGNOSIS — E119 Type 2 diabetes mellitus without complications: Secondary | ICD-10-CM | POA: Insufficient documentation

## 2015-09-06 DIAGNOSIS — I10 Essential (primary) hypertension: Secondary | ICD-10-CM | POA: Diagnosis not present

## 2015-09-06 DIAGNOSIS — R600 Localized edema: Secondary | ICD-10-CM | POA: Diagnosis not present

## 2015-09-13 DIAGNOSIS — R600 Localized edema: Secondary | ICD-10-CM | POA: Diagnosis not present

## 2015-09-13 DIAGNOSIS — E785 Hyperlipidemia, unspecified: Secondary | ICD-10-CM | POA: Diagnosis not present

## 2015-09-13 DIAGNOSIS — I83009 Varicose veins of unspecified lower extremity with ulcer of unspecified site: Secondary | ICD-10-CM | POA: Diagnosis not present

## 2015-09-13 DIAGNOSIS — I10 Essential (primary) hypertension: Secondary | ICD-10-CM | POA: Diagnosis not present

## 2015-09-13 DIAGNOSIS — L97909 Non-pressure chronic ulcer of unspecified part of unspecified lower leg with unspecified severity: Secondary | ICD-10-CM | POA: Diagnosis not present

## 2015-09-13 DIAGNOSIS — E1142 Type 2 diabetes mellitus with diabetic polyneuropathy: Secondary | ICD-10-CM | POA: Diagnosis not present

## 2015-09-19 DIAGNOSIS — R600 Localized edema: Secondary | ICD-10-CM | POA: Diagnosis not present

## 2015-09-25 DIAGNOSIS — I83023 Varicose veins of left lower extremity with ulcer of ankle: Secondary | ICD-10-CM | POA: Insufficient documentation

## 2015-09-25 DIAGNOSIS — I83009 Varicose veins of unspecified lower extremity with ulcer of unspecified site: Secondary | ICD-10-CM | POA: Diagnosis not present

## 2015-09-25 DIAGNOSIS — R600 Localized edema: Secondary | ICD-10-CM | POA: Diagnosis not present

## 2015-10-03 DIAGNOSIS — I83023 Varicose veins of left lower extremity with ulcer of ankle: Secondary | ICD-10-CM | POA: Diagnosis not present

## 2015-10-03 DIAGNOSIS — R600 Localized edema: Secondary | ICD-10-CM | POA: Diagnosis not present

## 2015-10-03 DIAGNOSIS — I83029 Varicose veins of left lower extremity with ulcer of unspecified site: Secondary | ICD-10-CM | POA: Diagnosis not present

## 2015-10-09 DIAGNOSIS — E1129 Type 2 diabetes mellitus with other diabetic kidney complication: Secondary | ICD-10-CM | POA: Diagnosis not present

## 2015-10-09 DIAGNOSIS — I83023 Varicose veins of left lower extremity with ulcer of ankle: Secondary | ICD-10-CM | POA: Diagnosis not present

## 2015-10-09 DIAGNOSIS — R600 Localized edema: Secondary | ICD-10-CM | POA: Diagnosis not present

## 2015-10-09 DIAGNOSIS — R809 Proteinuria, unspecified: Secondary | ICD-10-CM | POA: Diagnosis not present

## 2015-10-10 DIAGNOSIS — E1129 Type 2 diabetes mellitus with other diabetic kidney complication: Secondary | ICD-10-CM | POA: Diagnosis not present

## 2015-10-10 DIAGNOSIS — R809 Proteinuria, unspecified: Secondary | ICD-10-CM | POA: Diagnosis not present

## 2015-10-13 ENCOUNTER — Inpatient Hospital Stay
Admission: EM | Admit: 2015-10-13 | Discharge: 2015-10-17 | DRG: 300 | Disposition: A | Discharge status: Skilled Nursing Facility | Payer: 59 | Attending: Internal Medicine | Admitting: Internal Medicine

## 2015-10-13 ENCOUNTER — Emergency Department: Payer: 59

## 2015-10-13 ENCOUNTER — Encounter: Payer: Self-pay | Admitting: *Deleted

## 2015-10-13 DIAGNOSIS — Z9889 Other specified postprocedural states: Secondary | ICD-10-CM | POA: Diagnosis not present

## 2015-10-13 DIAGNOSIS — N189 Chronic kidney disease, unspecified: Secondary | ICD-10-CM | POA: Diagnosis present

## 2015-10-13 DIAGNOSIS — Z79899 Other long term (current) drug therapy: Secondary | ICD-10-CM

## 2015-10-13 DIAGNOSIS — I69354 Hemiplegia and hemiparesis following cerebral infarction affecting left non-dominant side: Secondary | ICD-10-CM

## 2015-10-13 DIAGNOSIS — Z90710 Acquired absence of both cervix and uterus: Secondary | ICD-10-CM

## 2015-10-13 DIAGNOSIS — L304 Erythema intertrigo: Secondary | ICD-10-CM | POA: Diagnosis present

## 2015-10-13 DIAGNOSIS — Z825 Family history of asthma and other chronic lower respiratory diseases: Secondary | ICD-10-CM | POA: Diagnosis not present

## 2015-10-13 DIAGNOSIS — L03116 Cellulitis of left lower limb: Secondary | ICD-10-CM | POA: Diagnosis present

## 2015-10-13 DIAGNOSIS — I878 Other specified disorders of veins: Secondary | ICD-10-CM | POA: Diagnosis present

## 2015-10-13 DIAGNOSIS — I129 Hypertensive chronic kidney disease with stage 1 through stage 4 chronic kidney disease, or unspecified chronic kidney disease: Secondary | ICD-10-CM | POA: Diagnosis present

## 2015-10-13 DIAGNOSIS — Z794 Long term (current) use of insulin: Secondary | ICD-10-CM

## 2015-10-13 DIAGNOSIS — L97909 Non-pressure chronic ulcer of unspecified part of unspecified lower leg with unspecified severity: Secondary | ICD-10-CM | POA: Diagnosis present

## 2015-10-13 DIAGNOSIS — Z8249 Family history of ischemic heart disease and other diseases of the circulatory system: Secondary | ICD-10-CM | POA: Diagnosis not present

## 2015-10-13 DIAGNOSIS — Z833 Family history of diabetes mellitus: Secondary | ICD-10-CM | POA: Diagnosis not present

## 2015-10-13 DIAGNOSIS — E1122 Type 2 diabetes mellitus with diabetic chronic kidney disease: Secondary | ICD-10-CM | POA: Diagnosis present

## 2015-10-13 DIAGNOSIS — Z9049 Acquired absence of other specified parts of digestive tract: Secondary | ICD-10-CM | POA: Diagnosis not present

## 2015-10-13 DIAGNOSIS — I10 Essential (primary) hypertension: Secondary | ICD-10-CM

## 2015-10-13 DIAGNOSIS — I83009 Varicose veins of unspecified lower extremity with ulcer of unspecified site: Secondary | ICD-10-CM | POA: Diagnosis not present

## 2015-10-13 DIAGNOSIS — T814XXA Infection following a procedure, initial encounter: Secondary | ICD-10-CM

## 2015-10-13 DIAGNOSIS — L8990 Pressure ulcer of unspecified site, unspecified stage: Secondary | ICD-10-CM | POA: Insufficient documentation

## 2015-10-13 DIAGNOSIS — Z7982 Long term (current) use of aspirin: Secondary | ICD-10-CM | POA: Diagnosis not present

## 2015-10-13 DIAGNOSIS — L0390 Cellulitis, unspecified: Secondary | ICD-10-CM | POA: Diagnosis present

## 2015-10-13 HISTORY — DX: Chronic kidney disease, unspecified: N18.9

## 2015-10-13 HISTORY — DX: Cerebral infarction, unspecified: I63.9

## 2015-10-13 LAB — CBC WITH DIFFERENTIAL/PLATELET
Basophils Absolute: 0.1 10*3/uL (ref 0–0.1)
Basophils Relative: 1 %
Eosinophils Absolute: 0.2 10*3/uL (ref 0–0.7)
Eosinophils Relative: 3 %
HCT: 32.1 % — ABNORMAL LOW (ref 35.0–47.0)
Hemoglobin: 10.6 g/dL — ABNORMAL LOW (ref 12.0–16.0)
Lymphocytes Relative: 22 %
Lymphs Abs: 1.8 10*3/uL (ref 1.0–3.6)
MCH: 26.8 pg (ref 26.0–34.0)
MCHC: 32.9 g/dL (ref 32.0–36.0)
MCV: 81.5 fL (ref 80.0–100.0)
Monocytes Absolute: 0.5 10*3/uL (ref 0.2–0.9)
Monocytes Relative: 6 %
Neutro Abs: 5.5 10*3/uL (ref 1.4–6.5)
Neutrophils Relative %: 68 %
Platelets: 326 10*3/uL (ref 150–440)
RBC: 3.94 MIL/uL (ref 3.80–5.20)
RDW: 15.5 % — ABNORMAL HIGH (ref 11.5–14.5)
WBC: 8.2 10*3/uL (ref 3.6–11.0)

## 2015-10-13 LAB — GLUCOSE, CAPILLARY: Glucose-Capillary: 142 mg/dL — ABNORMAL HIGH (ref 65–99)

## 2015-10-13 LAB — COMPREHENSIVE METABOLIC PANEL
ALT: 14 U/L (ref 14–54)
AST: 16 U/L (ref 15–41)
Albumin: 3.5 g/dL (ref 3.5–5.0)
Alkaline Phosphatase: 73 U/L (ref 38–126)
Anion gap: 8 (ref 5–15)
BUN: 21 mg/dL — ABNORMAL HIGH (ref 6–20)
CO2: 29 mmol/L (ref 22–32)
Calcium: 8.9 mg/dL (ref 8.9–10.3)
Chloride: 103 mmol/L (ref 101–111)
Creatinine, Ser: 1.03 mg/dL — ABNORMAL HIGH (ref 0.44–1.00)
GFR calc Af Amer: >60 mL/min (ref >60)
GFR calc non Af Amer: 55 mL/min — ABNORMAL LOW (ref >60)
Glucose, Bld: 196 mg/dL — ABNORMAL HIGH (ref 65–99)
Potassium: 4.7 mmol/L (ref 3.5–5.1)
Sodium: 140 mmol/L (ref 135–145)
Total Bilirubin: 0.4 mg/dL (ref 0.3–1.2)
Total Protein: 7.5 g/dL (ref 6.5–8.1)

## 2015-10-13 LAB — LACTIC ACID, PLASMA
Lactic Acid, Venous: 1.7 mmol/L (ref 0.5–2.0)
Lactic Acid, Venous: 1.4 mmol/L (ref 0.5–2.0)

## 2015-10-13 MED ORDER — ACETAMINOPHEN 325 MG PO TABS: 650.0000 mg | ORAL_TABLET | Freq: Four times a day (QID) | ORAL | Status: DC | PRN

## 2015-10-13 MED ORDER — METOPROLOL SUCCINATE ER 100 MG PO TB24
100.0000 mg | ORAL_TABLET | Freq: Every day | ORAL | Status: DC
  Administered 2015-10-13 – 2015-10-17 (×5): 100 mg via ORAL
  Filled 2015-10-13 (×5): qty 1

## 2015-10-13 MED ORDER — HEPARIN SODIUM (PORCINE) 5000 UNIT/ML IJ SOLN
5000.0000 [IU] | Freq: Three times a day (TID) | INTRAMUSCULAR | Status: DC
  Administered 2015-10-13 – 2015-10-16 (×8): 5000 [IU] via SUBCUTANEOUS
  Filled 2015-10-13 (×7): qty 1

## 2015-10-13 MED ORDER — ASPIRIN EC 81 MG PO TBEC
81.0000 mg | DELAYED_RELEASE_TABLET | Freq: Every day | ORAL | Status: DC
  Administered 2015-10-13 – 2015-10-17 (×5): 81 mg via ORAL
  Filled 2015-10-13 (×5): qty 1

## 2015-10-13 MED ORDER — HYDRALAZINE HCL 20 MG/ML IJ SOLN
10.0000 mg | Freq: Four times a day (QID) | INTRAMUSCULAR | Status: DC | PRN
  Administered 2015-10-15 – 2015-10-16 (×2): 10 mg via INTRAVENOUS
  Filled 2015-10-13 (×2): qty 1

## 2015-10-13 MED ORDER — POTASSIUM CHLORIDE CRYS ER 20 MEQ PO TBCR
20.0000 meq | EXTENDED_RELEASE_TABLET | Freq: Every day | ORAL | Status: DC
  Administered 2015-10-13 – 2015-10-17 (×5): 20 meq via ORAL
  Filled 2015-10-13 (×5): qty 1

## 2015-10-13 MED ORDER — PANTOPRAZOLE SODIUM 40 MG PO TBEC
40.0000 mg | DELAYED_RELEASE_TABLET | Freq: Every day | ORAL | Status: DC
  Administered 2015-10-13 – 2015-10-17 (×5): 40 mg via ORAL
  Filled 2015-10-13 (×5): qty 1

## 2015-10-13 MED ORDER — ONDANSETRON HCL 4 MG PO TABS
4.0000 mg | ORAL_TABLET | Freq: Four times a day (QID) | ORAL | Status: DC | PRN
  Administered 2015-10-16: 4 mg via ORAL
  Filled 2015-10-13: qty 1

## 2015-10-13 MED ORDER — VANCOMYCIN HCL 10 G IV SOLR
1250.0000 mg | Freq: Two times a day (BID) | INTRAVENOUS | Status: DC
  Administered 2015-10-14 – 2015-10-15 (×3): 1250 mg via INTRAVENOUS
  Filled 2015-10-13 (×4): qty 1250

## 2015-10-13 MED ORDER — OXYCODONE HCL 5 MG PO TABS
5.0000 mg | ORAL_TABLET | ORAL | Status: DC | PRN
  Administered 2015-10-14 – 2015-10-17 (×10): 5 mg via ORAL
  Filled 2015-10-13 (×10): qty 1

## 2015-10-13 MED ORDER — PIPERACILLIN-TAZOBACTAM 4.5 G IVPB
4.5000 g | Freq: Three times a day (TID) | INTRAVENOUS | Status: DC
  Administered 2015-10-14 – 2015-10-15 (×6): 4.5 g via INTRAVENOUS
  Filled 2015-10-13 (×8): qty 100

## 2015-10-13 MED ORDER — SODIUM CHLORIDE 0.9 % IV BOLUS (SEPSIS)
1000.0000 mL | INTRAVENOUS | Status: AC
  Administered 2015-10-13: 1000 mL via INTRAVENOUS

## 2015-10-13 MED ORDER — VANCOMYCIN HCL 10 G IV SOLR
2000.0000 mg | Freq: Once | INTRAVENOUS | Status: DC
  Filled 2015-10-13: qty 2000

## 2015-10-13 MED ORDER — CLONIDINE HCL 0.1 MG/24HR TD PTWK: 0.1000 mg | MEDICATED_PATCH | TRANSDERMAL | Status: DC

## 2015-10-13 MED ORDER — ACETAMINOPHEN 650 MG RE SUPP: 650.0000 mg | Freq: Four times a day (QID) | RECTAL | Status: DC | PRN

## 2015-10-13 MED ORDER — MORPHINE SULFATE (PF) 4 MG/ML IV SOLN
4.0000 mg | Freq: Once | INTRAVENOUS | Status: AC
  Administered 2015-10-13: 4 mg via INTRAVENOUS
  Filled 2015-10-13: qty 1

## 2015-10-13 MED ORDER — FUROSEMIDE 40 MG PO TABS
40.0000 mg | ORAL_TABLET | Freq: Every day | ORAL | Status: DC
  Administered 2015-10-13 – 2015-10-17 (×5): 40 mg via ORAL
  Filled 2015-10-13 (×5): qty 1

## 2015-10-13 MED ORDER — FUROSEMIDE 10 MG/ML IJ SOLN
40.0000 mg | Freq: Once | INTRAMUSCULAR | Status: AC
  Administered 2015-10-13: 40 mg via INTRAVENOUS
  Filled 2015-10-13: qty 4

## 2015-10-13 MED ORDER — MORPHINE SULFATE (PF) 2 MG/ML IV SOLN
1.0000 mg | INTRAVENOUS | Status: DC | PRN
  Administered 2015-10-13: 1 mg via INTRAVENOUS
  Filled 2015-10-13: qty 1

## 2015-10-13 MED ORDER — LISINOPRIL 20 MG PO TABS
40.0000 mg | ORAL_TABLET | Freq: Every day | ORAL | Status: DC
  Administered 2015-10-13 – 2015-10-17 (×5): 40 mg via ORAL
  Filled 2015-10-13 (×5): qty 2

## 2015-10-13 MED ORDER — INSULIN ASPART PROT & ASPART (70-30 MIX) 100 UNIT/ML ~~LOC~~ SUSP
50.0000 [IU] | Freq: Two times a day (BID) | SUBCUTANEOUS | Status: DC
  Administered 2015-10-14 – 2015-10-15 (×2): 50 [IU] via SUBCUTANEOUS
  Filled 2015-10-13: qty 10
  Filled 2015-10-13: qty 50
  Filled 2015-10-13: qty 10
  Filled 2015-10-13: qty 50

## 2015-10-13 MED ORDER — ONDANSETRON HCL 4 MG/2ML IJ SOLN
4.0000 mg | Freq: Four times a day (QID) | INTRAMUSCULAR | Status: DC | PRN
  Administered 2015-10-16: 4 mg via INTRAVENOUS
  Filled 2015-10-13: qty 2

## 2015-10-13 MED ORDER — NYSTATIN 100000 UNIT/GM EX CREA
TOPICAL_CREAM | Freq: Two times a day (BID) | CUTANEOUS | Status: DC
  Administered 2015-10-13 – 2015-10-17 (×8): via TOPICAL
  Filled 2015-10-13: qty 15

## 2015-10-13 MED ORDER — ONDANSETRON HCL 4 MG/2ML IJ SOLN
4.0000 mg | Freq: Once | INTRAMUSCULAR | Status: AC
  Administered 2015-10-13: 4 mg via INTRAVENOUS
  Filled 2015-10-13: qty 2

## 2015-10-13 MED ORDER — PIPERACILLIN-TAZOBACTAM 4.5 G IVPB
4.5000 g | Freq: Once | INTRAVENOUS | Status: AC
  Administered 2015-10-13: 4.5 g via INTRAVENOUS
  Filled 2015-10-13: qty 100

## 2015-10-13 MED ORDER — CLOPIDOGREL BISULFATE 75 MG PO TABS
75.0000 mg | ORAL_TABLET | Freq: Every day | ORAL | Status: DC
  Administered 2015-10-13 – 2015-10-17 (×5): 75 mg via ORAL
  Filled 2015-10-13 (×5): qty 1

## 2015-10-13 MED ORDER — ATORVASTATIN CALCIUM 20 MG PO TABS
20.0000 mg | ORAL_TABLET | Freq: Every day | ORAL | Status: DC
  Administered 2015-10-13 – 2015-10-15 (×3): 20 mg via ORAL
  Filled 2015-10-13 (×3): qty 1

## 2015-10-13 NOTE — ED Notes (Signed)
Open wound on left calf irrigated with sterile water and H2O2. Wound patted dry, assessed for presence of remaining maggots (none noted) and dressed with sterile gauze in wet-to-dry fashion per admitting provider.

## 2015-10-13 NOTE — Progress Notes (Signed)
ANTIBIOTIC CONSULT NOTE - INITIAL  Pharmacy Consult for vancomycin/Zosyn Indication: wound infection  No Known Allergies  Patient Measurements:   Adjusted Body Weight: 91.6 kg  Vital Signs: Temp: 97.9 F (36.6 C) (10/14 1920) Temp Source: Oral (10/14 1920) BP: 172/58 mmHg (10/14 2133) Pulse Rate: 75 (10/14 2145) Intake/Output from previous day:   Intake/Output from this shift:    Labs:  Recent Labs  10/13/15 2000  WBC 8.2  HGB 10.6*  PLT 326  CREATININE 1.03*   CrCl cannot be calculated (Unknown ideal weight.). No results for input(s): VANCOTROUGH, VANCOPEAK, VANCORANDOM, GENTTROUGH, GENTPEAK, GENTRANDOM, TOBRATROUGH, TOBRAPEAK, TOBRARND, AMIKACINPEAK, AMIKACINTROU, AMIKACIN in the last 72 hours.   Microbiology: Recent Results (from the past 720 hour(s))  Wound culture     Status: None (Preliminary result)   Collection Time: 10/13/15  8:19 PM  Result Value Ref Range Status   Specimen Description LEG LEFT LEG  Final   Special Requests Normal  Final   Gram Stain PENDING  Incomplete   Culture PENDING  Incomplete   Report Status PENDING  Incomplete    Medical History: Past Medical History  Diagnosis Date  . Hypertension   . Diabetes mellitus   . Stroke ([place])   . Chronic kidney disease     Medications:  Infusions:   Assessment:   Vd 64.1 L, Ke 0.069 hr-1, T1/2 10.1 hr, predicted trough 16 mcg/mL. BMI 47.   Goal of Therapy:  Vancomycin trough level 15-20 mcg/ml  Plan:  Expected duration 7 days with resolution of temperature and/or normalization of WBC. Zosyn 4.5 gm IV Q8H EI and vancomycin 1.25 gm IV Q12H, received 2 gm IV x 1 in ED, no additional stack. Will continue to follow and adjust dose as needed to maintain trough 15 to 20 mcg/mL.    Laural Benes, Pharm.D. Clinical Pharmacist 10/13/2015,10:16 PM

## 2015-10-13 NOTE — H&P (Signed)
[place] [facility] [place] NAME: Brooke Hopkins    MR#:  [id number]  DATE OF BIRTH:  1951-04-03  DATE OF ADMISSION:  10/13/2015  PRIMARY CARE PHYSICIAN: Adrian Prows, MD   REQUESTING/REFERRING PHYSICIAN: Dr Karma Greaser  CHIEF COMPLAINT:   Chief Complaint  Patient presents with  . Wound Check    HISTORY OF PRESENT ILLNESS:  Brooke Hopkins  is a 64 y.o. female with a known history of chronic venous stasis ulcers since 2011, diabetes, hypertension, stroke with residual left sided numbness and weakness, morbid obesity presents today from home because home health nurse discovered purulent drainage and maggots in the left lower extremity wound. She states that she had been doing wet-to-dry dressings on the left leg wounds until Monday when an Unna boot was placed. The Unna boot was supposed to be changed on Wednesday but the home health nurse did not have appropriate supplies so that was not changed until today. She has been on Augmentin with some improvement in wound size. On emergency room evaluation there were indeed maggots in the wound with purulent drainage and surrounding erythema. She is being admitted for treatment of cellulitis and wound infection.  PAST MEDICAL HISTORY:   Past Medical History  Diagnosis Date  . Hypertension   . Diabetes mellitus   . Stroke ([place])   . Chronic kidney disease     PAST SURGICAL HISTORY:   Past Surgical History  Procedure Laterality Date  . Lumbar disc surgery  11/07/11  . Abdominal hysterectomy    . Hammer toe surgery    . Cholecystectomy    . Tonsillectomy    . Knee arthroscopy    . Lumbar wound debridement  11/27/2011    Procedure: LUMBAR WOUND DEBRIDEMENT;  Surgeon: Eustace Moore;  Location: [place] [facility];  Service: Neurosurgery;  Laterality: N/A;    SOCIAL HISTORY:   Social History  Substance Use Topics  . Smoking status: Never Smoker   . Smokeless tobacco: Not on file  . Alcohol  Use: No    FAMILY HISTORY:   Family History  Problem Relation Age of Onset  . CAD Mother   . Hypertension Mother   . COPD Father   . Diabetes Sister   . Hypertension Sister     DRUG ALLERGIES:  No Known Allergies  REVIEW OF SYSTEMS:   Review of Systems  Constitutional: Positive for fever, chills and malaise/fatigue. Negative for weight loss.  HENT: Negative for congestion and hearing loss.   Eyes: Negative for blurred vision and pain.  Respiratory: Negative for cough, hemoptysis, sputum production, shortness of breath and stridor.   Cardiovascular: Positive for leg swelling. Negative for chest pain, palpitations and orthopnea.  Gastrointestinal: Negative for nausea, vomiting, abdominal pain, diarrhea, constipation and blood in stool.  Genitourinary: Negative for dysuria and frequency.  Musculoskeletal: Negative for myalgias, back pain, joint pain and neck pain.  Skin: Negative for rash.  Neurological: Positive for weakness. Negative for focal weakness, loss of consciousness and headaches.  Endo/Heme/Allergies: Does not bruise/bleed easily.  Psychiatric/Behavioral: Negative for depression and hallucinations. The patient is not nervous/anxious.     MEDICATIONS AT HOME:   Prior to Admission medications   Medication Sig Start Date End Date Taking? Authorizing Provider  amoxicillin-clavulanate (AUGMENTIN) 875-125 MG tablet Take 1 tablet by mouth 2 (two) times daily. 10/04/15 10/14/15 Yes Historical Provider, MD  aspirin EC 81 MG tablet Take 81 mg by mouth daily.   Yes Historical Provider, MD  atorvastatin (LIPITOR) 20 MG tablet Take 20 mg by mouth at bedtime.    Yes Historical Provider, MD  cloNIDine (CATAPRES - DOSED IN MG/24 HR) 0.1 mg/24hr patch Place 0.1 mg onto the skin once a week.   Yes Historical Provider, MD  clopidogrel (PLAVIX) 75 MG tablet Take 75 mg by mouth daily.   Yes Historical Provider, MD  furosemide (LASIX) 20 MG tablet Take 40 mg by mouth daily.    Yes  Historical Provider, MD  insulin NPH-regular Human (NOVOLIN 70/30) (70-30) 100 UNIT/ML injection Inject 65 Units into the skin 2 (two) times daily.   Yes Historical Provider, MD  lisinopril (PRINIVIL,ZESTRIL) 40 MG tablet Take 40 mg by mouth daily.   Yes Historical Provider, MD  metFORMIN (GLUCOPHAGE-XR) 500 MG 24 hr tablet Take 500 mg by mouth daily with supper.   Yes Historical Provider, MD  metoprolol succinate (TOPROL-XL) 100 MG 24 hr tablet Take 100 mg by mouth daily.   Yes Historical Provider, MD  pantoprazole (PROTONIX) 40 MG tablet Take 40 mg by mouth daily.   Yes Historical Provider, MD  potassium chloride SA (K-DUR,KLOR-CON) 20 MEQ tablet Take 20 mEq by mouth daily.   Yes Historical Provider, MD  promethazine (PHENERGAN) 25 MG tablet Take 25 mg by mouth every 8 (eight) hours as needed for nausea or vomiting.   Yes Historical Provider, MD  triamcinolone ointment (KENALOG) 0.5 % Apply 1 application topically 2 (two) times daily.   Yes Historical Provider, MD      VITAL SIGNS:  Blood pressure 172/58, pulse 74, temperature 97.9 F (36.6 C), temperature source Oral, resp. rate 16, SpO2 96 %.  PHYSICAL EXAMINATION:  GENERAL:  64 y.o.-year-old patient lying in the bed with no acute distress. Obese EYES: Pupils equal, round, reactive to light and accommodation. No scleral icterus. Extraocular muscles intact.  HEENT: Head atraumatic, normocephalic. Oropharynx and nasopharynx clear. Mucous members are moist NECK:  Supple, no jugular venous distention. No thyroid enlargement, no tenderness.  LUNGS: Normal breath sounds bilaterally, no wheezing, rales,rhonchi or crepitation. No use of accessory muscles of respiration.  CARDIOVASCULAR: S1, S2 normal. No murmurs, rubs, or gallops. Distant ABDOMEN: Soft, nontender, nondistended. Bowel sounds present. No organomegaly or mass. No guarding no rebound EXTREMITIES: 2+ pitting edema bilaterally, no cyanosis, or clubbing.  NEUROLOGIC: Cranial nerves II  through XII are intact. Muscle strength 5/5 in all extremities. Decreased sensation over left side. Gait not checked.  PSYCHIATRIC: The patient is alert and oriented x 3.  SKIN: Edema and erythema over both lower extremities, there is a blister over the right forefoot with clear drainage, there are 3 full skin thickness wound over the left lower extremity one on the anterior portion which has thick yellow purulent drainage is 1 cm x 3 cm, one on the medial portion is 3 cm x 3 cm and contains multiple active magnets and purulent drainage, one on the posterior portion contains granulation tissue no drainage. She also has intertrigo under both breasts and irritation in the skin folds of her abdomen.  LABORATORY PANEL:   CBC  Recent Labs Lab 10/13/15 2000  WBC 8.2  HGB 10.6*  HCT 32.1*  PLT 326   ------------------------------------------------------------------------------------------------------------------  Chemistries   Recent Labs Lab 10/13/15 2000  NA 140  K 4.7  CL 103  CO2 29  GLUCOSE 196*  BUN 21*  CREATININE 1.03*  CALCIUM 8.9  AST 16  ALT 14  ALKPHOS 73  BILITOT 0.4   ------------------------------------------------------------------------------------------------------------------  Cardiac Enzymes No  results for input(s): TROPONINI in the last 168 hours. ------------------------------------------------------------------------------------------------------------------  RADIOLOGY:  Dg Tibia/fibula Left  10/13/2015  CLINICAL DATA:  Wound infection.  Evaluate for osteomyelitis EXAM: LEFT TIBIA AND FIBULA - 2 VIEW COMPARISON:  None. FINDINGS: No acute fracture or dislocation. No aggressive her lytic or sclerotic osseous lesion. No significant joint effusion. No periosteal reaction or bone destruction. Soft tissue wound along the posteromedial distal left lower leg. Generalized soft tissue edema. There is peripheral vascular atherosclerotic disease. IMPRESSION: No  radiographic evidence of osteomyelitis of the left tibia or fibula. Soft tissue wound along the posterior medial left lower leg. Electronically Signed   By: Kathreen Devoid   On: 10/13/2015 20:17    EKG:   Orders placed or performed in visit on 02/18/15  . EKG 12-Lead    IMPRESSION AND PLAN:   #1 cellulitis and wound infection left lower extremity: Due to chronic venous stasis. Wound cultures have been obtained. Wound care consultation requested. Blood cultures obtained. Wound is being cleaned and magnets removed. She has been started on vancomycin and Zosyn in the emergency room which I will continue. Legs need to be elevated to decrease swelling. Low-sodium diet  #2 diabetes mellitus type 2: Check hemoglobin A1c and continue 70/30. Diabetic diet  #3 hypertension: Blood pressure is currently controlled. Continue lisinopril, furosemide, clonidine, metoprolol  #4 history of stroke: She has residual numbness on the left side. Continue aspirin Plavix statin  #5 intertrigo: Apply moisture whicking fabric, nystatin  #6: Chronic venous stasis: Elevate legs. Continue Lasix. Low sodium diet. Will give one dose IV lasix at this time to help with fluid burden. Renal function is stable.  CODE STATUS: full  TOTAL TIME TAKING CARE OF THIS PATIENT: 45 minutes.  Greater than 50% of time spent in coordination of care and counseling. Care plan discussed with the patient and her friend at the bedside as well as with emergency room physician.  Myrtis Ser M.D on 10/13/2015 at 9:42 PM  Between 7am to 6pm - Pager - [phone]  After 6pm go to [email or web address] - password [email or web address]  [facility]  Office  [phone]  CC: Primary care physician; Adrian Prows, MD

## 2015-10-13 NOTE — ED Notes (Signed)
Pt reports home health nurse came out to house to change bandage on legs and noticed maggots to left leg. Rewrapped today. Continues to have pain and swelling.

## 2015-10-13 NOTE — ED Provider Notes (Signed)
[facility] Emergency Department Provider Note  ____________________________________________  Time seen: Approximately 7:19 PM  I have reviewed the triage vital signs and the nursing notes.   HISTORY  Chief Complaint Wound Check    HPI Brooke Hopkins is a 64 y.o. female with extensive chronic medical issues including morbid obesity, chronic venous stasis, insulin-dependent diabetes, and who has been taking Augmentin and doxycycline for wounds in her lower extremities.  She was sent to the emergency department today by her home health nurse who came out to change her bandages and noted that there is purulent drainage and maggots in her left leg wound.  She was recently admitted to the hospital for a stroke and was noted to have cellulitis at that time.  She reportedly has been on Augmentin and doxycycline since then.  She states that the pain in her legs has been getting worse and that sometimes she awakens at night screaming in pain.  Her primary care doctor is Dr. Ola Spurr.  He prescribed the antibiotics and some pain medication which is helping only somewhat.  Home health reportedly was going to come out 2 days ago, but they did not have the supplies and were unable to come out until today.  Therefore the bandages on her lower extremities have been in place for longer than intended.  She reports feeling "bad" in general but denies fever/chills, chest pain, shortness of breath.  She occasionally has nausea but denies any vomiting.  Her wounds are described as severe and gradual in onset.   Past Medical History  Diagnosis Date  . Hypertension   . Diabetes mellitus   . Stroke [facility])     There are no active problems to display for this patient.   Past Surgical History  Procedure Laterality Date  . Lumbar disc surgery  11/07/11  . Abdominal hysterectomy    . Hammer toe surgery    . Cholecystectomy    . Tonsillectomy    . Knee arthroscopy    . Lumbar  wound debridement  11/27/2011    Procedure: LUMBAR WOUND DEBRIDEMENT;  Surgeon: Eustace Moore;  Location: [place] [facility];  Service: Neurosurgery;  Laterality: N/A;    Current Outpatient Rx  Name  Route  Sig  Dispense  Refill  . amoxicillin-clavulanate (AUGMENTIN) 875-125 MG tablet   Oral   Take 1 tablet by mouth 2 (two) times daily.         Marland Kitchen aspirin EC 81 MG tablet   Oral   Take 81 mg by mouth daily.         Marland Kitchen atorvastatin (LIPITOR) 20 MG tablet   Oral   Take 20 mg by mouth at bedtime.          . cloNIDine (CATAPRES - DOSED IN MG/24 HR) 0.1 mg/24hr patch   Transdermal   Place 0.1 mg onto the skin once a week.         . clopidogrel (PLAVIX) 75 MG tablet   Oral   Take 75 mg by mouth daily.         . furosemide (LASIX) 20 MG tablet   Oral   Take 40 mg by mouth daily.          . insulin NPH-regular Human (NOVOLIN 70/30) (70-30) 100 UNIT/ML injection   Subcutaneous   Inject 65 Units into the skin 2 (two) times daily.         Marland Kitchen lisinopril (PRINIVIL,ZESTRIL) 40 MG tablet   Oral  Take 40 mg by mouth daily.         . metFORMIN (GLUCOPHAGE-XR) 500 MG 24 hr tablet   Oral   Take 500 mg by mouth daily with supper.         . metoprolol succinate (TOPROL-XL) 100 MG 24 hr tablet   Oral   Take 100 mg by mouth daily.         . pantoprazole (PROTONIX) 40 MG tablet   Oral   Take 40 mg by mouth daily.         . potassium chloride SA (K-DUR,KLOR-CON) 20 MEQ tablet   Oral   Take 20 mEq by mouth daily.         . promethazine (PHENERGAN) 25 MG tablet   Oral   Take 25 mg by mouth every 8 (eight) hours as needed for nausea or vomiting.         . triamcinolone ointment (KENALOG) 0.5 %   Topical   Apply 1 application topically 2 (two) times daily.           Allergies Review of patient's allergies indicates no known allergies.  No family history on file.  Social History Social History  Substance Use Topics  . Smoking status: Never Smoker   .  Smokeless tobacco: None  . Alcohol Use: No    Review of Systems Constitutional: No fever/chills.  General malaise and generalized weakness. Eyes: No visual changes. ENT: No sore throat. Cardiovascular: Denies chest pain. Respiratory: Occasional shortness of breath. Gastrointestinal: No abdominal pain.  No nausea, no vomiting.  No diarrhea.  No constipation. Genitourinary: Negative for dysuria. Musculoskeletal: Chronic wounds in bilateral lower extremities both anterior and posterior that have purulent material.  Reportedly has maggots in the left lower extremity wound.  Increasing bilateral lower extremity pain, worse on the left. Skin: Negative for rash. Neurological: Negative for headaches, focal weakness or numbness.  10-point ROS otherwise negative.  ____________________________________________   PHYSICAL EXAM:  ED Triage Vitals  Enc Vitals Group     BP 10/13/15 1920 217/103 mmHg     Pulse Rate 10/13/15 1920 71     Resp 10/13/15 1920 16     Temp 10/13/15 1920 97.9 F (36.6 C)     Temp Source 10/13/15 1920 Oral     SpO2 10/13/15 1920 100 %     Weight --      Height --      Head Cir --      Peak Flow --      Pain Score 10/13/15 1913 6     Pain Loc --      Pain Edu? --      Excl. in [place]? --     Constitutional: Alert and oriented. Well appearing and in no acute distress. Eyes: Conjunctivae are normal. PERRL. EOMI. Head: Atraumatic. Nose: No congestion/rhinnorhea. Mouth/Throat: Mucous membranes are moist.  Oropharynx non-erythematous. Neck: No stridor.   Cardiovascular: Normal rate, regular rhythm. Grossly normal heart sounds.  Good peripheral circulation. Respiratory: Normal respiratory effort.  No retractions. Lungs CTAB. Gastrointestinal: Morbid obesity.  Soft and nontender. No distention. No abdominal bruits. No CVA tenderness. Musculoskeletal: Chronic lower extremity edema bilaterally.  Increased redness of both LEs (according to patient) with multiple open  wounds, some with purulent material.  Deepest wound is on distal LLE, posterimedial aspect, which is essentially a hole with purulent material and numerous live maggots living in the wound.  Some mild surrounding cellulitis.  Foot is warm with 1+ pitting  edema.  I took a photo of the maggot-infested wound with the [facility] app and put it directly in the patient's chart. See the Media tab under Chart Review to see the photo. Neurologic:  Normal speech and language. No gross focal neurologic deficits are appreciated.  Skin:  Skin is warm, dry and intact. No rash noted. Psychiatric: Mood and affect are normal. Speech and behavior are normal.  ____________________________________________   LABS (all labs ordered are listed, but only abnormal results are displayed)  Labs Reviewed  COMPREHENSIVE METABOLIC PANEL - Abnormal; Notable for the following:    Glucose, Bld 196 (*)    BUN 21 (*)    Creatinine, Ser 1.03 (*)    GFR calc non Af Amer 55 (*)    All other components within normal limits  CBC WITH DIFFERENTIAL/PLATELET - Abnormal; Notable for the following:    Hemoglobin 10.6 (*)    HCT 32.1 (*)    RDW 15.5 (*)    All other components within normal limits  WOUND CULTURE  ANAEROBIC CULTURE  CULTURE, BLOOD (ROUTINE X 2)  CULTURE, BLOOD (ROUTINE X 2)  LACTIC ACID, PLASMA  LACTIC ACID, PLASMA   ____________________________________________  EKG  Not indicated ____________________________________________  RADIOLOGY   Dg Tibia/fibula Left  10/13/2015  CLINICAL DATA:  Wound infection.  Evaluate for osteomyelitis EXAM: LEFT TIBIA AND FIBULA - 2 VIEW COMPARISON:  None. FINDINGS: No acute fracture or dislocation. No aggressive her lytic or sclerotic osseous lesion. No significant joint effusion. No periosteal reaction or bone destruction. Soft tissue wound along the posteromedial distal left lower leg. Generalized soft tissue edema. There is peripheral vascular atherosclerotic disease.  IMPRESSION: No radiographic evidence of osteomyelitis of the left tibia or fibula. Soft tissue wound along the posterior medial left lower leg. Electronically Signed   By: Kathreen Devoid   On: 10/13/2015 20:17    ____________________________________________   PROCEDURES  Procedure(s) performed: None  Critical Care performed: No ____________________________________________   INITIAL IMPRESSION / ASSESSMENT AND PLAN / ED COURSE  Pertinent labs & imaging results that were available during my care of the patient were reviewed by me and considered in my medical decision making (see chart for details).  Wound cultures, blood cultures, standard labs, empiric broad spectrum antibiotics (Zosyn 4.5 g IV to cover for possible pseudomonas, and Vanc 2g IV for weight-based dosing for probable MRSA).    Worsening wounds in spite of outpatient antibiotics (Augmentin and possibly doxycycline as well).  No evidence of osteo on radiographs.  Will admit for further management.  NS 1L IV for lactate elevation, but no evidence of severe sepsis.    Uncontrolled hypertension, but asymptomatic.  Holding treatment - deferring in favor of hospitalist preference.  ____________________________________________  FINAL CLINICAL IMPRESSION(S) / ED DIAGNOSES  Final diagnoses:  Wound with infection determined by examination, initial encounter  Uncontrolled hypertension      NEW MEDICATIONS STARTED DURING THIS VISIT:  New Prescriptions   No medications on file     Hinda Kehr, MD 10/13/15 [phone]

## 2015-10-14 LAB — BASIC METABOLIC PANEL
Anion gap: 6 (ref 5–15)
BUN: 21 mg/dL — ABNORMAL HIGH (ref 6–20)
CO2: 27 mmol/L (ref 22–32)
Calcium: 8.3 mg/dL — ABNORMAL LOW (ref 8.9–10.3)
Chloride: 107 mmol/L (ref 101–111)
Creatinine, Ser: 1 mg/dL (ref 0.44–1.00)
GFR calc Af Amer: >60 mL/min (ref >60)
GFR calc non Af Amer: 57 mL/min — ABNORMAL LOW (ref >60)
Glucose, Bld: 146 mg/dL — ABNORMAL HIGH (ref 65–99)
Potassium: 4.5 mmol/L (ref 3.5–5.1)
Sodium: 140 mmol/L (ref 135–145)

## 2015-10-14 LAB — GLUCOSE, CAPILLARY
Glucose-Capillary: 145 mg/dL — ABNORMAL HIGH (ref 65–99)
Glucose-Capillary: 138 mg/dL — ABNORMAL HIGH (ref 65–99)
Glucose-Capillary: 110 mg/dL — ABNORMAL HIGH (ref 65–99)

## 2015-10-14 LAB — CBC
HCT: 30.1 % — ABNORMAL LOW (ref 35.0–47.0)
Hemoglobin: 10.2 g/dL — ABNORMAL LOW (ref 12.0–16.0)
MCH: 27.5 pg (ref 26.0–34.0)
MCHC: 33.8 g/dL (ref 32.0–36.0)
MCV: 81.5 fL (ref 80.0–100.0)
Platelets: 333 10*3/uL (ref 150–440)
RBC: 3.69 MIL/uL — ABNORMAL LOW (ref 3.80–5.20)
RDW: 15.1 % — ABNORMAL HIGH (ref 11.5–14.5)
WBC: 8.8 10*3/uL (ref 3.6–11.0)

## 2015-10-14 LAB — HEMOGLOBIN A1C: Hgb A1c MFr Bld: 6.3 % — ABNORMAL HIGH (ref 4.0–6.0)

## 2015-10-14 MED ORDER — MORPHINE SULFATE (PF) 4 MG/ML IV SOLN: 4.0000 mg | INTRAVENOUS | Status: DC | PRN

## 2015-10-14 MED ORDER — MORPHINE SULFATE (PF) 2 MG/ML IV SOLN
2.0000 mg | INTRAVENOUS | Status: DC | PRN
  Administered 2015-10-16: 2 mg via INTRAVENOUS
  Filled 2015-10-14: qty 1

## 2015-10-14 MED ORDER — DOCUSATE SODIUM 100 MG PO CAPS
100.0000 mg | ORAL_CAPSULE | Freq: Two times a day (BID) | ORAL | Status: DC
  Administered 2015-10-14 – 2015-10-17 (×7): 100 mg via ORAL
  Filled 2015-10-14 (×8): qty 1

## 2015-10-14 MED ORDER — INFLUENZA VAC SPLIT QUAD 0.5 ML IM SUSY
0.5000 mL | PREFILLED_SYRINGE | INTRAMUSCULAR | Status: AC
  Administered 2015-10-17: 0.5 mL via INTRAMUSCULAR
  Filled 2015-10-14 (×2): qty 0.5

## 2015-10-14 MED ORDER — INSULIN ASPART PROT & ASPART (70-30 MIX) 100 UNIT/ML ~~LOC~~ SUSP
10.0000 [IU] | Freq: Once | SUBCUTANEOUS | Status: AC
  Administered 2015-10-14: 10 [IU] via SUBCUTANEOUS

## 2015-10-14 NOTE — Progress Notes (Signed)
Patient arrived to unit 2300. Patient alert and oriented. Breast, groin, below abdomen moisture associated rash noted.Left lower leg above calf 3 open wounds noted. Wounds dressed and foam applied. Blood pressure elevated scheduled medication giving. Pain controlled with PRN medication.

## 2015-10-14 NOTE — Evaluation (Signed)
Physical Therapy Evaluation Patient Details Name: Brooke Hopkins MRN: [id number] DOB: [birth year] Today's Date: 10/14/2015   History of Present Illness  Pt had a CVA 8 months ago and has been limited since, she had been able to do some in home walking, etc but recently has been more and more limited and now has b/l LE cellulitis   Clinical Impression  Pt is quite limited with what she is able to functionally do today, and is not only pain limited but reports that she has only really been able to do stand-pivot transfers to/from her w/c at home for months.  She shows good effort with PT exam, but with subacute CVA L sided weakness and the pain of her b/l LE cellulitis she is limited.  Husband had surgery recently and won't be able to help a lot.  Discussed how safety may be an issue at home if her LEs continue to be weak and painful.  They both understand and feel strongly about going home on d/c.      Follow Up Recommendations Home health PT;SNF (wants to go home per today's exam this may not be safe?)    Equipment Recommendations       Recommendations for Other Services       Precautions / Restrictions Precautions Precautions: Fall Restrictions Weight Bearing Restrictions: No      Mobility  Bed Mobility Overal bed mobility: Needs Assistance Bed Mobility: Supine to Sit;Sit to Supine     Supine to sit: Min assist;Mod assist Sit to supine: Mod assist;Max assist   General bed mobility comments: Pt with heavy use of the rails to get to EOB and sitting, needs heavy assist with b/l LEs to get back into bed  Transfers Overall transfer level: Needs assistance Equipment used: Rolling walker (2 wheeled) Transfers: Sit to/from Stand Sit to Stand: Mod assist;Max assist         General transfer comment: Pt initially attempts to get up with only min assist from PT, unable to get to standing.  On second attempt PT assists more and is able to get to standing and actaully maintain  balance with only CGA for some of the time.  Ambulation/Gait             General Gait Details: Pt was unable to actually walk, but was able to shuffle one small step to the side and one small step to the back with mod/max assist  Stairs            Wheelchair Mobility    Modified Rankin (Stroke Patients Only)       Balance                                             Pertinent Vitals/Pain Pain Assessment: 0-10 Pain Score: 7  Pain Location: b/l LEs but hypersensitive with any palpation    Home Living Family/patient expects to be discharged to:: Private residence Living Arrangements: Spouse/significant other   Type of Home: House Home Access: Ramped entrance              Prior Function Level of Independence: Independent with assistive device(s)         Comments: Pt spends most of her time in her w/c, recently she has been doing only sit/stand transfers from/to bed/BSC with no real ambulation     Hand Dominance  Extremity/Trunk Assessment   Upper Extremity Assessment:  (L side grossly 3+/5, R WFL)           Lower Extremity Assessment: Generalized weakness (quite limited secondary to pain, L weaker than R)         Communication   Communication: No difficulties  Cognition Arousal/Alertness: Awake/alert Behavior During Therapy: WFL for tasks assessed/performed;Anxious Overall Cognitive Status: Within Functional Limits for tasks assessed                      General Comments      Exercises        Assessment/Plan    PT Assessment Patient needs continued PT services  PT Diagnosis Difficulty walking;Generalized weakness;Acute pain   PT Problem List Decreased strength;Decreased range of motion;Decreased activity tolerance;Decreased balance;Decreased mobility;Decreased knowledge of use of DME;Decreased safety awareness;Obesity;Pain  PT Treatment Interventions DME instruction;Gait training;Functional  mobility training;Therapeutic activities;Therapeutic exercise;Balance training;Neuromuscular re-education   PT Goals (Current goals can be found in the Care Plan section) Acute Rehab PT Goals Patient Stated Goal: "I really would rather go home" PT Goal Formulation: With patient/family Time For Goal Achievement: 10/28/15 Potential to Achieve Goals: Fair    Frequency Min 2X/week   Barriers to discharge        Co-evaluation               End of Session Equipment Utilized During Treatment: Gait belt Activity Tolerance: Patient limited by pain;Patient limited by fatigue Patient left: with bed alarm set           Time: 1414-1447 PT Time Calculation (min) (ACUTE ONLY): 33 min   Charges:   PT Evaluation $Initial PT Evaluation Tier I: 1 Procedure     PT G Codes:       Wayne Both, PT, DPT [phone]  Kreg Shropshire 10/14/2015, 4:49 PM

## 2015-10-14 NOTE — Progress Notes (Signed)
Called Dr. Margaretmary Eddy regarding patient glucose level of 110.  Patient is due to get Novolog 70/30 50 units.  Advised to hold this dose and give a one time dose of 10 units Novolog 70/30.  Advised to add note to order Novolog 70/30/ (50 units) that states if glucose <150 call MD for dosing.

## 2015-10-14 NOTE — Progress Notes (Signed)
Patient total assist with movement in bed.  Patient heavy urination due to lasix administration.  Pain being managed with po pain medication.  New orders for IV pain medication added this shift but has not been needed.  Patient very emotional and appears to be slightly depressed that her husband just had surgery and she is not able to take care of him, however, patient only uses wheelchair at home and appears that husband cares for her.

## 2015-10-14 NOTE — Progress Notes (Signed)
[facility] [place] NAME: Brooke Hopkins    MR#:  [id number]  DATE OF BIRTH:  1951-07-07  SUBJECTIVE:  CHIEF COMPLAINT:  Patient is reporting chronic lower extremity swelling and weeping. Denies any chest pain or shortness of breath. Worried about wounds on the left lower extremity  REVIEW OF SYSTEMS:  CONSTITUTIONAL: No fever, fatigue or weakness.  EYES: No blurred or double vision.  EARS, NOSE, AND THROAT: No tinnitus or ear pain.  RESPIRATORY: No cough, shortness of breath, wheezing or hemoptysis.  CARDIOVASCULAR: No chest pain, orthopnea, edema.  GASTROINTESTINAL: No nausea, vomiting, diarrhea or abdominal pain.  GENITOURINARY: No dysuria, hematuria.  ENDOCRINE: No polyuria, nocturia,  HEMATOLOGY: No anemia, easy bruising or bleeding SKIN: No rash or lesion. Chronic bilateral lower extremity edema with venous stasis with a recent left lower extremity wound with purulent discharge MUSCULOSKELETAL: No joint pain or arthritis.   NEUROLOGIC: No tingling, numbness, weakness.  PSYCHIATRY: No anxiety or depression.   DRUG ALLERGIES:  No Known Allergies  VITALS:  Blood pressure 122/32, pulse 65, temperature 98.2 F (36.8 C), temperature source Oral, resp. rate 18, height 5\' 6"  (1.676 m), weight 146.512 kg (323 lb), SpO2 92 %.  PHYSICAL EXAMINATION:  GENERAL:  64 y.o.-year-old patient lying in the bed with no acute distress.  EYES: Pupils equal, round, reactive to light and accommodation. No scleral icterus. Extraocular muscles intact.  HEENT: Head atraumatic, normocephalic. Oropharynx and nasopharynx clear.  NECK:  Supple, no jugular venous distention. No thyroid enlargement, no tenderness.  LUNGS: Normal breath sounds bilaterally, no wheezing, rales,rhonchi or crepitation. No use of accessory muscles of respiration.  CARDIOVASCULAR: S1, S2 normal. No murmurs, rubs, or gallops.  ABDOMEN: Soft, nontender, nondistended. Bowel sounds  present. No organomegaly or mass.  EXTREMITIES: No pedal edema, cyanosis, or clubbing. BL lower extremity is edematous, erythematous with chronic venous stasis, on the anterior portion of LLE  has thick yellow purulent drainage is 1 cm x 3 cm, on the medial portion is 3 cm x 3 cm ,purulent drainage, one on the posterior portion contains granulation tissue no drainage.  NEUROLOGIC: Cranial nerves II through XII are intact. Muscle strength 5/5 in all extremities. Sensation intact. Gait not checked.  PSYCHIATRIC: The patient is alert and oriented x 3.  SKIN: No obvious rash, lesion, or ulcer.    LABORATORY PANEL:   CBC  Recent Labs Lab 10/13/15 2237  WBC 8.8  HGB 10.2*  HCT 30.1*  PLT 333   ------------------------------------------------------------------------------------------------------------------  Chemistries   Recent Labs Lab 10/13/15 2000 10/13/15 2237  NA 140 140  K 4.7 4.5  CL 103 107  CO2 29 27  GLUCOSE 196* 146*  BUN 21* 21*  CREATININE 1.03* 1.00  CALCIUM 8.9 8.3*  AST 16  --   ALT 14  --   ALKPHOS 73  --   BILITOT 0.4  --    ------------------------------------------------------------------------------------------------------------------  Cardiac Enzymes No results for input(s): TROPONINI in the last 168 hours. ------------------------------------------------------------------------------------------------------------------  RADIOLOGY:  Dg Tibia/fibula Left  10/13/2015  CLINICAL DATA:  Wound infection.  Evaluate for osteomyelitis EXAM: LEFT TIBIA AND FIBULA - 2 VIEW COMPARISON:  None. FINDINGS: No acute fracture or dislocation. No aggressive her lytic or sclerotic osseous lesion. No significant joint effusion. No periosteal reaction or bone destruction. Soft tissue wound along the posteromedial distal left lower leg. Generalized soft tissue edema. There is peripheral vascular atherosclerotic disease. IMPRESSION: No radiographic evidence of osteomyelitis of  the left  tibia or fibula. Soft tissue wound along the posterior medial left lower leg. Electronically Signed   By: Kathreen Devoid   On: 10/13/2015 20:17    EKG:   Orders placed or performed in visit on 02/18/15  . EKG 12-Lead    ASSESSMENT AND PLAN:   #1 cellulitis and wound infection left lower extremity 2/2  chronic venous stasis.  F/u Wound cultures and blood cultures  Wound care consultation requested.  Continue vancomycin and Zosyn  Legs need to be elevated to decrease swelling. Surgical consult for posSIBLE  debridement  #2 diabetes mellitus type 2: Check hemoglobin A1c and continue 70/30. Diabetic diet  #3 hypertension: Blood pressure is stable. Continue lisinopril, furosemide, clonidine, metoprolol  #4 history of stroke: She has residual numbness on the left side. Continue aspirin Plavix statin  #5 intertrigo: Apply moisture whicking fabric, nystatin  #6: Chronic venous stasis: Elevate legs. Continue Lasix. Low sodium diet.  CODE STATUS: full     All the records are reviewed and case discussed with Care Management/Social Workerr. Management plans discussed with the patient, family and they are in agreement.  CODE STATUS: full  TOTAL TIME TAKING CARE OF THIS PATIENT: 35  minutes.   POSSIBLE D/C IN 2-3 DAYS, DEPENDING ON CLINICAL CONDITION.   Nicholes Mango M.D on 10/14/2015 at 2:54 PM  Between 7am to 6pm - Pager - [phone] After 6pm go to [email or web address] - password [email or web address]  [facility]  Office  [phone]  CC: Primary care physician; Adrian Prows, MD

## 2015-10-15 LAB — GLUCOSE, CAPILLARY
Glucose-Capillary: 152 mg/dL — ABNORMAL HIGH (ref 65–99)
Glucose-Capillary: 147 mg/dL — ABNORMAL HIGH (ref 65–99)
Glucose-Capillary: 117 mg/dL — ABNORMAL HIGH (ref 65–99)
Glucose-Capillary: 135 mg/dL — ABNORMAL HIGH (ref 65–99)

## 2015-10-15 LAB — CBC
HCT: 28 % — ABNORMAL LOW (ref 35.0–47.0)
Hemoglobin: 9.5 g/dL — ABNORMAL LOW (ref 12.0–16.0)
MCH: 27.5 pg (ref 26.0–34.0)
MCHC: 33.8 g/dL (ref 32.0–36.0)
MCV: 81.3 fL (ref 80.0–100.0)
Platelets: 265 10*3/uL (ref 150–440)
RBC: 3.45 MIL/uL — ABNORMAL LOW (ref 3.80–5.20)
RDW: 15.2 % — ABNORMAL HIGH (ref 11.5–14.5)
WBC: 8.3 10*3/uL (ref 3.6–11.0)

## 2015-10-15 LAB — CREATININE, SERUM
Creatinine, Ser: 1.15 mg/dL — ABNORMAL HIGH (ref 0.44–1.00)
GFR calc Af Amer: 56 mL/min — ABNORMAL LOW (ref >60)
GFR calc non Af Amer: 48 mL/min — ABNORMAL LOW (ref >60)

## 2015-10-15 LAB — VANCOMYCIN, TROUGH: Vancomycin Tr: 25 ug/mL (ref 10–20)

## 2015-10-15 MED ORDER — INSULIN ASPART PROT & ASPART (70-30 MIX) 100 UNIT/ML ~~LOC~~ SUSP
10.0000 [IU] | Freq: Two times a day (BID) | SUBCUTANEOUS | Status: DC
  Administered 2015-10-16 (×2): 10 [IU] via SUBCUTANEOUS
  Filled 2015-10-15: qty 10
  Filled 2015-10-15: qty 1

## 2015-10-15 MED ORDER — DEXTROSE 5 % IV SOLN
2.0000 g | INTRAVENOUS | Status: DC
  Administered 2015-10-16 – 2015-10-17 (×2): 2 g via INTRAVENOUS
  Filled 2015-10-15 (×4): qty 2

## 2015-10-15 MED ORDER — INSULIN ASPART PROT & ASPART (70-30 MIX) 100 UNIT/ML ~~LOC~~ SUSP
10.0000 [IU] | Freq: Once | SUBCUTANEOUS | Status: AC
  Administered 2015-10-15: 10 [IU] via SUBCUTANEOUS

## 2015-10-15 MED ORDER — METOPROLOL TARTRATE 1 MG/ML IV SOLN: 5.0000 mg | INTRAVENOUS | Status: DC | PRN

## 2015-10-15 MED ORDER — CLONIDINE HCL 0.2 MG/24HR TD PTWK
0.2000 mg | MEDICATED_PATCH | TRANSDERMAL | Status: DC
  Administered 2015-10-16: 0.2 mg via TRANSDERMAL
  Filled 2015-10-15 (×2): qty 1

## 2015-10-15 MED ORDER — HYDRALAZINE HCL 25 MG PO TABS
25.0000 mg | ORAL_TABLET | Freq: Once | ORAL | Status: AC
Start: 2015-10-15 — End: 2015-10-15
  Administered 2015-10-15: 25 mg via ORAL
  Filled 2015-10-15: qty 1

## 2015-10-15 MED ORDER — DEXTROSE 5 % IV SOLN: 1.0000 g | INTRAVENOUS | Status: DC

## 2015-10-15 MED ORDER — VANCOMYCIN HCL IN DEXTROSE 1-5 GM/200ML-% IV SOLN
1000.0000 mg | Freq: Two times a day (BID) | INTRAVENOUS | Status: DC
  Filled 2015-10-15: qty 200

## 2015-10-15 NOTE — Care Management Note (Addendum)
Case Management Note  Patient Details  Name: GRIFFIN TAKATA MRN: [id number] Date of Birth: 1951/01/26  Subjective/Objective:     64yo Ms Yacine Gorzynski was admitted 10/13/15 from home per left leg cellulitis and wound infection. Dx Chronic venous stasis ulcers. She is followed by [facility]. Ms Bazer reports that an [facility] RN came to her home on Wednesday 10/11/15 but did not change the dressing on her leg and therefore did not examine her leg wounds. On Friday 10/13/15 Ms Sinon presented to the [facility] and was told that there were maggots in the wounds on her legs. Cheryl at [facility] was updated and will see Ms Dondra Prader on Monday in her hospital room. Both Mr and Mrs Wintle report that the wheelchair provided to her by [employer] "is falling apart." They reported this to [facility] and were told that they had only had the chair for a year and another one could not be ordered. Mr Starling states that a plastic part is broken on the chair preventing the wheels from rolling freely. PCP=Dr Ola Spurr. Pharmacy=[facility] in [place]. Ms Selinsky resides at home with her husband. Assistive equipment includes a rolling walker, a bedside commode, a shower chair and a broken wheelchair. No home oxygen. Husband provides transportation to appointments. Case management will follow for discharge planning and may need to contact [place] to repair Ms [facility] wheelchair.               Action/Plan:   Expected Discharge Date:                  Expected Discharge Plan:     In-House Referral:     Discharge planning Services     Post Acute Care Choice:    Choice offered to:     DME Arranged:    DME Agency:     HH Arranged:    [place] Agency:     Status of Service:     Medicare Important Message Given:    Date Medicare IM Given:    Medicare IM give by:    Date Additional Medicare IM Given:    Additional Medicare Important Message give by:     If discussed at [place] of Stay Meetings,  dates discussed:    Additional Comments:  Faithe Ariola A, RN 10/15/2015, 12:59 PM

## 2015-10-15 NOTE — Progress Notes (Addendum)
Paged Dr. Margaretmary Eddy again to request how to proceed.   Paged again.  Pending response.

## 2015-10-15 NOTE — Progress Notes (Signed)
ANTIBIOTIC CONSULT NOTE - INITIAL  Pharmacy Consult for vancomycin/Zosyn Indication: wound infection  No Known Allergies  Patient Measurements: Height: 5\' 6"  (167.6 cm) Weight: (!) 323 lb (146.512 kg) IBW/kg (Calculated) : 59.3 Adjusted Body Weight: 91.6 kg  Vital Signs: Temp: 98.5 F (36.9 C) (10/16 0805) Temp Source: Oral (10/16 0805) BP: 158/42 mmHg (10/16 1002) Pulse Rate: 70 (10/16 1002) Intake/Output from previous day: 10/15 0701 - 10/16 0700 In: 760 [P.O.:510; IV Piggyback:250] Out: 100 [Urine:100] Intake/Output from this shift: Total I/O In: 240 [P.O.:240] Out: -   Labs:  Recent Labs  10/13/15 2000 10/13/15 2237 10/15/15 0349  WBC 8.2 8.8 8.3  HGB 10.6* 10.2* 9.5*  PLT 326 333 265  CREATININE 1.03* 1.00 1.15*   Estimated Creatinine Clearance: 71.6 mL/min (by C-G formula based on Cr of 1.15).  Recent Labs  10/15/15 1125  [place] 25*     Microbiology: Recent Results (from the past 720 hour(s))  Blood culture (routine x 2)     Status: None (Preliminary result)   Collection Time: 10/13/15  8:00 PM  Result Value Ref Range Status   Specimen Description BLOOD RIGHT FATTY CASTS  Final   Special Requests BOTTLES DRAWN AEROBIC AND ANAEROBIC 4CC  Final   Culture NO GROWTH < 24 HOURS  Final   Report Status PENDING  Incomplete  Blood culture (routine x 2)     Status: None (Preliminary result)   Collection Time: 10/13/15  8:02 PM  Result Value Ref Range Status   Specimen Description BLOOD LEFT ARM  Final   Special Requests BOTTLES DRAWN AEROBIC AND ANAEROBIC [place]  Final   Culture NO GROWTH < 24 HOURS  Final   Report Status PENDING  Incomplete  Wound culture     Status: None (Preliminary result)   Collection Time: 10/13/15  8:19 PM  Result Value Ref Range Status   Specimen Description LEG LEFT LEG  Final   Special Requests Normal  Final   Gram Stain   Final    FEW WBC SEEN MODERATE GRAM NEGATIVE RODS MODERATE GRAM POSITIVE COCCI FEW GRAM POSITIVE  RODS    Culture   Final    PROTEUS MIRABILIS TRYING TO ISOLATE OTHER POTENTIAL PATHOGENS    Report Status PENDING  Incomplete   Organism ID, Bacteria PROTEUS MIRABILIS  Final      Susceptibility   Proteus mirabilis - MIC*    AMPICILLIN <=2 SENSITIVE Sensitive     CEFTAZIDIME <=1 SENSITIVE Sensitive     CEFAZOLIN 8 SENSITIVE Sensitive     CEFTRIAXONE <=1 SENSITIVE Sensitive     CIPROFLOXACIN <=0.25 SENSITIVE Sensitive     GENTAMICIN <=1 SENSITIVE Sensitive     IMIPENEM 8 INTERMEDIATE Intermediate     TRIMETH/SULFA <=20 SENSITIVE Sensitive     PIP/TAZO Value in next row Sensitive      SENSITIVE<=4    * PROTEUS MIRABILIS  Anaerobic culture     Status: None (Preliminary result)   Collection Time: 10/13/15  8:19 PM  Result Value Ref Range Status   Specimen Description LEG LEFT  Final   Special Requests Normal  Final   Culture HOLDING FOR POSSIBLE ANAEROBE  Final   Report Status PENDING  Incomplete    Medical History: Past Medical History  Diagnosis Date  . Hypertension   . Diabetes mellitus   . Stroke ([place])   . Chronic kidney disease     Medications:  Infusions:   Assessment:   Vd 64.1 L, Ke 0.069 hr-1, T1/2 10.1  hr, predicted trough 16 mcg/mL. BMI 47.   Goal of Therapy:  Vancomycin trough level 15-20 mcg/ml  Plan:  Expected duration 7 days with resolution of temperature and/or normalization of WBC. Zosyn 4.5 gm IV Q8H EI and vancomycin 1.25 gm IV Q12H, received 2 gm IV x 1 in ED, no additional stack. Will continue to follow and adjust dose as needed to maintain trough 15 to 20 mcg/mL.   1016 1125 VT = 25. New ke = 0.052. Noon dose started prior to trough resulting, called nurse to discontinue (Pt received about half of the dose). Will recheck trough tonight ~12 hours after dose.  Recommend to restart vancomycin at 1000mg  Q12H once trough is within therapeutic range.   Krista Blue, Pharm.D. Clinical Pharmacist 10/15/2015,12:55 PM

## 2015-10-15 NOTE — Progress Notes (Signed)
Patient resting well this shift. Iv resited . Iv abx .

## 2015-10-15 NOTE — Progress Notes (Signed)
Paged Dr. Margaretmary Eddy to question if should proceed with PRN hydralazine with current DBP.

## 2015-10-15 NOTE — Progress Notes (Signed)
[place] at [place] NAME: Brooke Hopkins    MR#:  [id number]  DATE OF BIRTH:  01/31/1951  SUBJECTIVE:  CHIEF COMPLAINT:  Patient is reporting chronic lower extremity swelling and weeping. Feeling weak  REVIEW OF SYSTEMS:  CONSTITUTIONAL: No fever, fatigue , reports weakness.  EYES: No blurred or double vision.  EARS, NOSE, AND THROAT: No tinnitus or ear pain.  RESPIRATORY: No cough, shortness of breath, wheezing or hemoptysis.  CARDIOVASCULAR: No chest pain, orthopnea, edema.  GASTROINTESTINAL: No nausea, vomiting, diarrhea or abdominal pain.  GENITOURINARY: No dysuria, hematuria.  ENDOCRINE: No polyuria, nocturia,  HEMATOLOGY: No anemia, easy bruising or bleeding SKIN: No rash or lesion. Chronic bilateral lower extremity edema with venous stasis with a recent left lower extremity wound with purulent discharge MUSCULOSKELETAL: No joint pain or arthritis.   NEUROLOGIC: No tingling, numbness, weakness.  PSYCHIATRY: No anxiety or depression.   DRUG ALLERGIES:  No Known Allergies  VITALS:  Blood pressure 163/54, pulse 67, temperature 98.3 F (36.8 C), temperature source Oral, resp. rate 18, height 5\' 6"  (1.676 m), weight 146.512 kg (323 lb), SpO2 96 %.  PHYSICAL EXAMINATION:  GENERAL:  64 y.o.-year-old patient lying in the bed with no acute distress.  EYES: Pupils equal, round, reactive to light and accommodation. No scleral icterus. Extraocular muscles intact.  HEENT: Head atraumatic, normocephalic. Oropharynx and nasopharynx clear.  NECK:  Supple, no jugular venous distention. No thyroid enlargement, no tenderness.  LUNGS: Normal breath sounds bilaterally, no wheezing, rales,rhonchi or crepitation. No use of accessory muscles of respiration.  CARDIOVASCULAR: S1, S2 normal. No murmurs, rubs, or gallops.  ABDOMEN: Soft, nontender, nondistended. Bowel sounds present. No organomegaly or mass.  EXTREMITIES: No pedal edema, cyanosis,  or clubbing. BL lower extremity is edematous, erythematous with chronic venous stasis, on the anterior portion of LLE  has thick yellow purulent drainage is 1 cm x 3 cm, on the medial portion is 3 cm x 3 cm ,purulent drainage, one on the posterior portion contains granulation tissue no drainage.  NEUROLOGIC: Cranial nerves II through XII are intact. Muscle strength 5/5 in all extremities. Sensation intact. Gait not checked.  PSYCHIATRIC: The patient is alert and oriented x 3.  SKIN: No obvious rash, lesion, or ulcer.    LABORATORY PANEL:   CBC  Recent Labs Lab 10/15/15 0349  WBC 8.3  HGB 9.5*  HCT 28.0*  PLT 265   ------------------------------------------------------------------------------------------------------------------  Chemistries   Recent Labs Lab 10/13/15 2000 10/13/15 2237 10/15/15 0349  NA 140 140  --   K 4.7 4.5  --   CL 103 107  --   CO2 29 27  --   GLUCOSE 196* 146*  --   BUN 21* 21*  --   CREATININE 1.03* 1.00 1.15*  CALCIUM 8.9 8.3*  --   AST 16  --   --   ALT 14  --   --   ALKPHOS 73  --   --   BILITOT 0.4  --   --    ------------------------------------------------------------------------------------------------------------------  Cardiac Enzymes No results for input(s): TROPONINI in the last 168 hours. ------------------------------------------------------------------------------------------------------------------  RADIOLOGY:  No results found.  EKG:   Orders placed or performed in visit on 02/18/15  . EKG 12-Lead    ASSESSMENT AND PLAN:   #1 cellulitis and wound infection left lower extremity 2/2  chronic venous stasis. Wound cx with proteus sensitive to rocephin   F/u  blood cultures  Wound care consultation  requested.  Continue vancomycin and d/c Zosyn, add rocephin   Legs need to be elevated to decrease swelling. Surgical consult for posSIBLE  Debridement still pending  #2 diabetes mellitus type 2: Check hemoglobin A1c and  continue 70/30 at low dose 2/2 hypoglycemia. Diabetic diet  #3 hypertension: Blood pressure is stable. Continue lisinopril, furosemide, clonidine, metoprolol  #4 history of stroke: She has residual numbness on the left side. Continue aspirin Plavix statin  #5 intertrigo: Apply moisture whicking fabric, nystatin  #6: Chronic venous stasis: Elevate legs. Continue Lasix. Low sodium diet.  CODE STATUS: full     All the records are reviewed and case discussed with Care Management/Social Workerr. Management plans discussed with the patient, family and they are in agreement.  CODE STATUS: full  TOTAL TIME TAKING CARE OF THIS PATIENT: 35  minutes.   POSSIBLE D/C IN 2-3 DAYS, DEPENDING ON CLINICAL CONDITION.   Nicholes Mango M.D on 10/15/2015 at 10:48 PM  Between 7am to 6pm - Pager - [phone] After 6pm go to [email or web address] - password [email or web address] [facility]  [place] [facility]  Office  [phone]  CC: Primary care physician; Adrian Prows, MD

## 2015-10-15 NOTE — Progress Notes (Signed)
Called pharmacy to question if could proceed with vanc dosing and was advised to proceed.

## 2015-10-15 NOTE — Progress Notes (Signed)
Patient was eating and asked if they could have the IV paused during eating since the pump was going off so much.  Delayed pump for 30 minutes.  During this time noticed that pharmacy discontinued this dose that was paused due to trough.  Called to question since I was previously advised to proceed.  Instructed to stop dosing.

## 2015-10-15 NOTE — Progress Notes (Signed)
Patient cooperative with care.  Shows signs of overall improvement with pain control and appetite. Wound consult pending and standard wound care completed until orders have been received.

## 2015-10-16 LAB — GLUCOSE, CAPILLARY
Glucose-Capillary: 158 mg/dL — ABNORMAL HIGH (ref 65–99)
Glucose-Capillary: 214 mg/dL — ABNORMAL HIGH (ref 65–99)
Glucose-Capillary: 202 mg/dL — ABNORMAL HIGH (ref 65–99)
Glucose-Capillary: 155 mg/dL — ABNORMAL HIGH (ref 65–99)

## 2015-10-16 LAB — CBC
HCT: 29.2 % — ABNORMAL LOW (ref 35.0–47.0)
Hemoglobin: 9.4 g/dL — ABNORMAL LOW (ref 12.0–16.0)
MCH: 26.4 pg (ref 26.0–34.0)
MCHC: 32.4 g/dL (ref 32.0–36.0)
MCV: 81.4 fL (ref 80.0–100.0)
Platelets: 278 10*3/uL (ref 150–440)
RBC: 3.58 MIL/uL — ABNORMAL LOW (ref 3.80–5.20)
RDW: 15.3 % — ABNORMAL HIGH (ref 11.5–14.5)
WBC: 9.2 10*3/uL (ref 3.6–11.0)

## 2015-10-16 LAB — WOUND CULTURE: Special Requests: NORMAL

## 2015-10-16 LAB — BASIC METABOLIC PANEL
Anion gap: 6 (ref 5–15)
BUN: 15 mg/dL (ref 6–20)
CO2: 30 mmol/L (ref 22–32)
Calcium: 8.4 mg/dL — ABNORMAL LOW (ref 8.9–10.3)
Chloride: 104 mmol/L (ref 101–111)
Creatinine, Ser: 1.17 mg/dL — ABNORMAL HIGH (ref 0.44–1.00)
GFR calc Af Amer: 55 mL/min — ABNORMAL LOW (ref >60)
GFR calc non Af Amer: 47 mL/min — ABNORMAL LOW (ref >60)
Glucose, Bld: 115 mg/dL — ABNORMAL HIGH (ref 65–99)
Potassium: 3.8 mmol/L (ref 3.5–5.1)
Sodium: 140 mmol/L (ref 135–145)

## 2015-10-16 LAB — VANCOMYCIN, TROUGH: Vancomycin Tr: 16 ug/mL (ref 10–20)

## 2015-10-16 MED ORDER — COLLAGENASE 250 UNIT/GM EX OINT
TOPICAL_OINTMENT | Freq: Every day | CUTANEOUS | Status: DC
  Administered 2015-10-16 – 2015-10-17 (×2): via TOPICAL
  Filled 2015-10-16 (×2): qty 30

## 2015-10-16 MED ORDER — INSULIN ASPART PROT & ASPART (70-30 MIX) 100 UNIT/ML ~~LOC~~ SUSP
15.0000 [IU] | Freq: Two times a day (BID) | SUBCUTANEOUS | Status: DC
  Administered 2015-10-17: 15 [IU] via SUBCUTANEOUS
  Filled 2015-10-16: qty 15

## 2015-10-16 MED ORDER — ENOXAPARIN SODIUM 40 MG/0.4ML ~~LOC~~ SOLN
40.0000 mg | Freq: Two times a day (BID) | SUBCUTANEOUS | Status: DC
  Administered 2015-10-16 – 2015-10-17 (×2): 40 mg via SUBCUTANEOUS
  Filled 2015-10-16 (×2): qty 0.4

## 2015-10-16 MED ORDER — ATORVASTATIN CALCIUM 20 MG PO TABS
80.0000 mg | ORAL_TABLET | Freq: Every day | ORAL | Status: DC
  Administered 2015-10-16: 80 mg via ORAL
  Filled 2015-10-16: qty 4

## 2015-10-16 MED ORDER — HYDRALAZINE HCL 10 MG PO TABS
10.0000 mg | ORAL_TABLET | Freq: Three times a day (TID) | ORAL | Status: DC
  Administered 2015-10-16 – 2015-10-17 (×2): 10 mg via ORAL
  Filled 2015-10-16 (×6): qty 1

## 2015-10-16 MED ORDER — VANCOMYCIN HCL IN DEXTROSE 750-5 MG/150ML-% IV SOLN
750.0000 mg | INTRAVENOUS | Status: DC
  Administered 2015-10-16: 750 mg via INTRAVENOUS
  Filled 2015-10-16 (×2): qty 150

## 2015-10-16 MED ORDER — LACTULOSE 10 GM/15ML PO SOLN
20.0000 g | Freq: Two times a day (BID) | ORAL | Status: DC | PRN
  Administered 2015-10-16: 20 g via ORAL
  Filled 2015-10-16: qty 30

## 2015-10-16 NOTE — Consult Note (Addendum)
WOC wound consult note Reason for Consult: Chronic LLE venous stasis ulcers, recent decline with maggots found in the wounds.  Cellulitis Wound type:Chronic venous stasis ulcers Pressure Ulcer POA: N/A Measurement:2 wounds LLE, medial aspect 2 cm x 2 cm x 0.3 cm and 2 cm x 1.5 cm x 0.2 cm both are 100% stringy slough to wound bed with musty odor.  RLE anterior shin 4 cm x 2.2 cm x 0.1 cm 100% pink wound bed RLE posterior shin 3 cm x 2 cm x 0.3 cm with 100% stringy yellow slough with musty odor.  Intertriginous dermatitis beneath breasts and abdominal skin folds.  Measure and cut length of InterDry Ag+ to fit in skin folds that have skin breakdown Tuck InterDry  Ag+ fabric into skin folds in a single layer, allow for 2 inches of overhang from skin edges to allow for wicking to occur May remove to bathe; dry area thoroughly and then tuck into affected areas again Do not apply any creams or ointments when using InterDry Ag+ DO NOT THROW AWAY FOR 5 DAYS unless soiled with stool DO NOT [facility] product, this will inactivate the silver in the material  New sheet of Interdry Ag+ should be applied after 5 days of use if patient continues to have skin breakdown   Drainage (amount, consistency, odor) Moderate purulent drainage with foul odor.  No maggots noted in wound bed today.  Periwound:Erythema and edema to bilateral lower extremities.  Was receiving HH services, compression applied in MD office on Monday, maggots noted on visit Friday.  Dressing procedure/placement/frequency:Cleanse bilateral lower legs with soap and water and pat gently dry.  Apply Santyl ointment to wound bed, 1/8 inch thick (opaque).  Cover with NS moist gauze.  Secure with 4x4 gauze and kerlix/tape.  Change daily.  To be discharged home with [facility] and patient understands that there will need to be a teachable caregiver for daily wound care (Santyl).  Santyl to be sent home with patient.  Will not follow at this time.  Please re-consult  if needed.  Domenic Moras RN BSN [place] Pager [phone]

## 2015-10-16 NOTE — Progress Notes (Signed)
[place] at [place] NAME: Zuriyah Atz    MR#:  [id number]  DATE OF BIRTH:  02/21/1951  SUBJECTIVE:  CHIEF COMPLAINT:  Patient didn't sleep well last night ,concerned about her elevated BP   Feeling weak  REVIEW OF SYSTEMS:  CONSTITUTIONAL: No fever, fatigue , reports weakness.  EYES: No blurred or double vision.  EARS, NOSE, AND THROAT: No tinnitus or ear pain.  RESPIRATORY: No cough, shortness of breath, wheezing or hemoptysis.  CARDIOVASCULAR: No chest pain, orthopnea, edema.  GASTROINTESTINAL: No nausea, vomiting, diarrhea or abdominal pain.  GENITOURINARY: No dysuria, hematuria.  ENDOCRINE: No polyuria, nocturia,  HEMATOLOGY: No anemia, easy bruising or bleeding SKIN: No rash or lesion. Chronic bilateral lower extremity edema with venous stasis with a recent left lower extremity wound with purulent discharge MUSCULOSKELETAL: No joint pain or arthritis.   NEUROLOGIC: No tingling, numbness, weakness.  PSYCHIATRY: No anxiety or depression.   DRUG ALLERGIES:  No Known Allergies  VITALS:  Blood pressure 159/50, pulse 66, temperature 98.8 F (37.1 C), temperature source Oral, resp. rate 20, height 5\' 6"  (1.676 m), weight 146.512 kg (323 lb), SpO2 92 %.  PHYSICAL EXAMINATION:  GENERAL:  64 y.o.-year-old patient lying in the bed with no acute distress.  EYES: Pupils equal, round, reactive to light and accommodation. No scleral icterus. Extraocular muscles intact.  HEENT: Head atraumatic, normocephalic. Oropharynx and nasopharynx clear.  NECK:  Supple, no jugular venous distention. No thyroid enlargement, no tenderness.  LUNGS: Normal breath sounds bilaterally, no wheezing, rales,rhonchi or crepitation. No use of accessory muscles of respiration.  CARDIOVASCULAR: S1, S2 normal. No murmurs, rubs, or gallops.  ABDOMEN: Soft, nontender, nondistended. Bowel sounds present. No organomegaly or mass.  EXTREMITIES: No pedal edema,  cyanosis, or clubbing. BL lower extremity is edematous, erythematous with chronic venous stasis, on the anterior portion of LLE  has thick yellow purulent drainage is 1 cm x 3 cm, on the medial portion is 3 cm x 3 cm ,purulent drainage, one on the posterior portion contains granulation tissue no drainage.  NEUROLOGIC: Cranial nerves II through XII are intact. Muscle strength 5/5 in all extremities. Sensation intact. Gait not checked.  PSYCHIATRIC: The patient is alert and oriented x 3.  SKIN: No obvious rash, lesion, or ulcer.    LABORATORY PANEL:   CBC  Recent Labs Lab 10/16/15 0040  WBC 9.2  HGB 9.4*  HCT 29.2*  PLT 278   ------------------------------------------------------------------------------------------------------------------  Chemistries   Recent Labs Lab 10/13/15 2000  10/16/15 0040  NA 140  < > 140  K 4.7  < > 3.8  CL 103  < > 104  CO2 29  < > 30  GLUCOSE 196*  < > 115*  BUN 21*  < > 15  CREATININE 1.03*  < > 1.17*  CALCIUM 8.9  < > 8.4*  AST 16  --   --   ALT 14  --   --   ALKPHOS 73  --   --   BILITOT 0.4  --   --   < > = values in this interval not displayed. ------------------------------------------------------------------------------------------------------------------  Cardiac Enzymes No results for input(s): TROPONINI in the last 168 hours. ------------------------------------------------------------------------------------------------------------------  RADIOLOGY:  No results found.  EKG:   Orders placed or performed in visit on 02/18/15  . EKG 12-Lead    ASSESSMENT AND PLAN:   #1 cellulitis and wound infection left lower extremity 2/2  chronic venous stasis. Wound cx with proteus  and Morganella sensitive to rocephin   F/u  blood cultures  Appreciate wound care recommendations Discontinue vancomycin and  Zosyn;continue rocephin   Legs need to be elevated to decrease swelling. Vascular Surgical consult for posSIBLE  Debridement still  pending  #2 diabetes mellitus type 2 :  hemoglobin A1c 6.3    continue 70/30 at low dose 2/2 hypoglycemia. Diabetic diet  #3 hypertension: Blood pressure is elevated. Hydralazine 10 po tid is added Clonidine is increased from 0.1 to 0.2 mg patch , started yesterday  Continue lisinopril, furosemide, clonidine, metoprolol  #4 history of stroke: She has residual numbness on the left side. Continue aspirin Plavix statin  #5 intertrigo: Apply moisture whicking fabric, nystatin  #6: Chronic venous stasis: Elevate legs. Continue Lasix. Low sodium diet.  CODE STATUS: full     All the records are reviewed and case discussed with Care Management/Social Workerr. Management plans discussed with the patient, family and they are in agreement.  CODE STATUS: full  TOTAL TIME TAKING CARE OF THIS PATIENT: 35  minutes.   POSSIBLE D/C IN 2-3 DAYS, DEPENDING ON CLINICAL CONDITION.   Nicholes Mango M.D on 10/16/2015 at 9:08 PM  Between 7am to 6pm - Pager - [phone] After 6pm go to [email or web address] - password [email or web address] [facility]  [facility]  Office  [phone]  CC: Primary care physician; Adrian Prows, MD

## 2015-10-16 NOTE — Care Management (Addendum)
I met with patient and her husband. I delivered a list of home health care agencies to them for review. They are unsure if they want to stay with [facility] care. [facility] has been notified of concerns [facility] care for patient wound care management. I have spoke with [employer] office [phone] about concerns. They advised that I contact Crystal Education officer, [employer] of [employer] in [place]. "Dressing changes were done Friday at Dr. Blane Ohara office; Home health RN called patient on Monday to arrange dressing treatment; patient told [facility] that she didn't have any supply's to change dressings". I spoke with Crystal- she is aware and investigating. HHRN Cassie did see patient on Friday- sent patient to hospital. Crystal will follow up with this RNCM regarding this case.

## 2015-10-16 NOTE — Progress Notes (Signed)
Inpatient Diabetes Program Recommendations  AACE/ADA: New Consensus Statement on Inpatient Glycemic Control (2015)  Target Ranges:  Prepandial:   less than 140 mg/dL      Peak postprandial:   less than 180 mg/dL (1-2 hours)      Critically ill patients:  140 - 180 mg/dL   Review of Glycemic Control:  Results for Brooke, Hopkins (MRN [id number]) as of 10/16/2015 09:16  Ref. Range 10/15/2015 08:06 10/15/2015 11:09 10/15/2015 16:52 10/15/2015 20:56 10/16/2015 07:45  Glucose-Capillary Latest Ref Range: 65-99 mg/dL 152 (H) 147 (H) 117 (H) 135 (H) 158 (H)  Results for Brooke, Hopkins (MRN [id number]) as of 10/16/2015 09:16  Ref. Range 10/13/2015 20:00  Hemoglobin A1C Latest Ref Range: 4.0-6.0 % 6.3 (H)   Diabetes history: Type 2 diabetes Outpatient Diabetes medications: Novolin 70/30 65 units bid Current orders for Inpatient glycemic control:  Novolog 70/30 10 units bid  Inpatient Diabetes Program Recommendations:    Blood sugars are well controlled this morning.  Note that patient did receive 50 units of 70/30 yesterday morning.  She only received 10 units of 70/30 this morning.  Consider increasing AM dose of 70/30 to 40 units.  Thanks, Adah Perl, RN, BC-ADM Inpatient Diabetes Coordinator Pager [phone]  (8a-5p)

## 2015-10-16 NOTE — Care Management Important Message (Signed)
Important Message  Patient Details  Name: Brooke Hopkins MRN: [id number] Date of Birth: June 05, 1951   Medicare Important Message Given:  Yes-second notification given    Marshell Garfinkel, RN 10/16/2015, 9:42 AM

## 2015-10-16 NOTE — Progress Notes (Signed)
Patient sleeping well this shift. Incontinent of urine  . Iv abx . States will go home first part of this week . Husband will assit with care.

## 2015-10-16 NOTE — Clinical Social Work Note (Signed)
Clinical Social Work Assessment  Patient Details  Name: Brooke Hopkins MRN: [id number] Date of Birth: 1951/04/02  Date of referral:  10/09/15               Reason for consult:  Facility Placement (SNF [place] )                Permission sought to share information with:  Case Manager Permission granted to share information::  Yes, Verbal Permission Granted  Name::      Marshell Garfinkel   Agency::   [name]  Relationship::   RN Case Primary school teacher Information:     Housing/Transportation Living arrangements for the past 2 months:  [place] of Information:  Patient Patient Interpreter Needed:  None Criminal Activity/Legal Involvement Pertinent to Current Situation/Hospitalization:  No - Comment as needed Significant Relationships:  Adult Children, Spouse, Other Family Members (Grandchildren ) Lives with:    Do you feel safe going back to the place where you live?  Yes Need for family participation in patient care:  Yes (Comment)  Care giving concerns: Patient lives with her [name] in [place].    Facilities manager / plan: Holiday representative ([place]) received verbal consult in progression rounds from RN that patient came into [facility] with maggots in her left lower extremity. PT is recommending SNF VS. Home Health. CSW met with patient alone at bedside to discuss concerns and D/C plan. Patient was alert and oriented and sitting up in the bed. CSW introduced self and explained role of CSW department. Patient reported that she lives in [place] with her [name]. Per patient her son Bernita Raisin lives next door with his twin boys who are 66 years old. Patient reported that she has 2 children from a previous marriage and her husband has 1 child from a previous marriage. CSW discussed with patient how the maggots were found in her wounds. Patient reported that she went to Dr. Einar Crow office on Monday and got a dressing change and an [facility] home  health RN came out Wednesday and unwrapped the dressing and found the maggots. Patient reported that she become sick to her stomach and came to [facility]. Patient reported that she told the [facility] nurse that she needed a UNA boot and dressings however the nurse did not have the correct supplies when she came out to patient's house. CSW explained that PT is recommending SNF VS. Home Health. Patient politely declined SNF and reported that she is going home. Patient reported that she had a stroke in February 2016 and went to SNF for rehab. Per patient she has all the equipment she needs at home including a wheel chair and bed side commode. Plan is for patient to return home. RN Case Manager aware of above. Please reconsult if future social work needs arise. CSW signing off.   Employment status:  Disabled (Comment on whether or not currently receiving Disability), Retired Nurse, adult, [employer] PT Recommendations:  [place], Home with [place] ([place] ) Information / Referral to community resources:  Other (Comment Required) (Home Health )  Patient/Family's Response to care: Patient refused SNF and is returning home.   Patient/Family's Understanding of and Emotional Response to Diagnosis, Current Treatment, and Prognosis: Patient was pleasant and thanked CSW for visit.   Emotional Assessment Appearance:  Appears stated age Attitude/Demeanor/Rapport:    Affect (typically observed):  Accepting, Adaptable, Pleasant Orientation:  Oriented to Self,  Oriented to Place, Oriented to  Time, Oriented to Situation Alcohol / Substance use:  Not Applicable Psych involvement (Current and /or in the community):  No (Comment)  Discharge Needs  Concerns to be addressed:  Denies Needs/Concerns at this time Readmission within the last 30 days:  No Current discharge risk:  None Barriers to Discharge:  Continued Medical Work up   Loralyn Freshwater,  LCSW 10/16/2015, 10:41 AM

## 2015-10-16 NOTE — Plan of Care (Signed)
Problem: Phase III Progression Outcomes Goal: Wound care performed by pt/family Outcome: Progressing Nursing

## 2015-10-17 DIAGNOSIS — L8990 Pressure ulcer of unspecified site, unspecified stage: Secondary | ICD-10-CM | POA: Insufficient documentation

## 2015-10-17 LAB — ANAEROBIC CULTURE: Special Requests: NORMAL

## 2015-10-17 LAB — GLUCOSE, CAPILLARY
Glucose-Capillary: 175 mg/dL — ABNORMAL HIGH (ref 65–99)
Glucose-Capillary: 215 mg/dL — ABNORMAL HIGH (ref 65–99)

## 2015-10-17 MED ORDER — ATORVASTATIN CALCIUM 80 MG PO TABS
80.0000 mg | ORAL_TABLET | Freq: Every day | ORAL | Status: DC
Start: 2015-10-17 — End: 2020-12-07

## 2015-10-17 MED ORDER — INSULIN ASPART PROT & ASPART (70-30 MIX) 100 UNIT/ML ~~LOC~~ SUSP: 25.0000 [IU] | Freq: Two times a day (BID) | SUBCUTANEOUS | Status: DC

## 2015-10-17 MED ORDER — INSULIN ASPART 100 UNIT/ML ~~LOC~~ SOLN
0.0000 [IU] | Freq: Three times a day (TID) | SUBCUTANEOUS | Status: DC
  Administered 2015-10-17: 3 [IU] via SUBCUTANEOUS
  Filled 2015-10-17: qty 3

## 2015-10-17 MED ORDER — INSULIN ASPART PROT & ASPART (70-30 MIX) 100 UNIT/ML ~~LOC~~ SUSP: 20.0000 [IU] | Freq: Two times a day (BID) | SUBCUTANEOUS | Status: DC

## 2015-10-17 MED ORDER — HYDRALAZINE HCL 25 MG PO TABS
25.0000 mg | ORAL_TABLET | Freq: Three times a day (TID) | ORAL | Status: DC
Start: 2015-10-17 — End: 2017-06-20

## 2015-10-17 MED ORDER — AMOXICILLIN-POT CLAVULANATE 875-125 MG PO TABS: 1.0000 | ORAL_TABLET | Freq: Two times a day (BID) | ORAL | Status: DC

## 2015-10-17 MED ORDER — NYSTATIN 100000 UNIT/GM EX CREA: TOPICAL_CREAM | Freq: Two times a day (BID) | CUTANEOUS | Status: DC

## 2015-10-17 MED ORDER — AMOXICILLIN-POT CLAVULANATE 875-125 MG PO TABS: 1.0000 | ORAL_TABLET | Freq: Two times a day (BID) | ORAL | Status: AC

## 2015-10-17 MED ORDER — CLONIDINE HCL 0.2 MG/24HR TD PTWK: 0.2000 mg | MEDICATED_PATCH | TRANSDERMAL | Status: DC

## 2015-10-17 MED ORDER — OXYCODONE HCL 5 MG PO TABS: 5.0000 mg | ORAL_TABLET | ORAL | Status: DC | PRN

## 2015-10-17 MED ORDER — INSULIN ASPART 100 UNIT/ML ~~LOC~~ SOLN: 0.0000 [IU] | Freq: Every day | SUBCUTANEOUS | Status: DC

## 2015-10-17 MED ORDER — COLLAGENASE 250 UNIT/GM EX OINT: TOPICAL_OINTMENT | Freq: Every day | CUTANEOUS | Status: DC

## 2015-10-17 MED ORDER — HYDRALAZINE HCL 25 MG PO TABS
25.0000 mg | ORAL_TABLET | Freq: Three times a day (TID) | ORAL | Status: DC
  Administered 2015-10-17: 25 mg via ORAL
  Filled 2015-10-17: qty 1

## 2015-10-17 NOTE — Progress Notes (Signed)
Discharge via wheelchair with staff nurse.

## 2015-10-17 NOTE — Discharge Instructions (Signed)
Activity as tolerated, as recommended by physical therapy Continue home health with PT and RN Diet low-salt, diabetic diet Follow-up with primary care physician Dr. Ola Spurr in a week Follow-up with vascular surgeon Dr. Debera Lat in a week Outpatient follow-up with diabetic clinic-lifestyle in a week Keep legs elevated Continue wound care as recommended by wound care nurse

## 2015-10-17 NOTE — Discharge Summary (Signed)
Brooke Hopkins at [place] NAME: Brooke Hopkins    MR#:  [id number]  DATE OF BIRTH:  11/18/1951  DATE OF ADMISSION:  10/13/2015 ADMITTING PHYSICIAN: Aldean Jewett, MD  DATE OF DISCHARGE: 10/17/2015  PRIMARY CARE PHYSICIAN: Adrian Prows, MD    ADMISSION DIAGNOSIS:  Uncontrolled hypertension [I10] Wound with infection determined by examination, initial encounter [T81.4XXA]  DISCHARGE DIAGNOSIS:   Left lower extending to cellulitis with venous stasis ulcers Hypertension-uncontrolled Diabetes mellitus  SECONDARY DIAGNOSIS:   Past Medical History  Diagnosis Date  . Hypertension   . Diabetes mellitus   . Stroke ([place])   . Chronic kidney disease     HOSPITAL COURSE:   #1 cellulitis and wound infection left lower extremity 2/2 chronic venous stasis. Wound cx with proteus and Morganella sensitive to rocephin  F/u blood cultures negative for 4 days 2 Appreciate wound care recommendations Discontinue vancomycin and Zosyn;continued rocephin during the hospital course. Discharging home with by mouth Augmentin for 10 more days and to follow-up with primary care physician Dr. Ola Spurr in a week Legs need to be elevated to decrease swelling. Vascular Surgical consult for posSIBLE Debridement , not seen the patient. Ive Recommended outpatient follow-up with vascular surgery  #2 diabetes mellitus type 2 : hemoglobin A1c 6.3   continue 70/30 at low dose 25 units subcutaneously twice a day as patient with poor by mouth intake and low blood sugars . Diabetic diet  #3 hypertension: Blood pressure is elevated. Hydralazine 25 po tid is added Clonidine is increased from 0.1 to 0.2 mg patch , started 10/16/2015 Continue lisinopril, furosemide, clonidine, metoprolol  #4 history of stroke: She has residual numbness on the left side. Continue aspirin Plavix statin  #5 intertrigo: Apply moisture whicking fabric,  nystatin  #6: Chronic venous stasis: Elevate legs. Continue Lasix. Low sodium diet. Wound care and follow-up with vascular surgery as an outpatient  DISCHARGE CONDITIONS:   fair  CONSULTS OBTAINED:  Treatment Team:  Katha Cabal, MD   PROCEDURES none  DRUG ALLERGIES:  No Known Allergies  DISCHARGE MEDICATIONS:   Current Discharge Medication List    START taking these medications   Details  collagenase (SANTYL) ointment Apply topically daily. Qty: 30 g, Refills: 0    hydrALAZINE (APRESOLINE) 25 MG tablet Take 1 tablet (25 mg total) by mouth every 8 (eight) hours. Qty: 90 tablet, Refills: 0    insulin aspart protamine- aspart (NOVOLOG MIX 70/30) (70-30) 100 UNIT/ML injection Inject 0.25 mLs (25 Units total) into the skin 2 (two) times daily with a meal. Qty: 10 mL, Refills: 11    nystatin cream (MYCOSTATIN) Apply topically 2 (two) times daily. Qty: 30 g, Refills: 0    oxyCODONE (OXY IR/ROXICODONE) 5 MG immediate release tablet Take 1 tablet (5 mg total) by mouth every 4 (four) hours as needed for moderate pain. Qty: 30 tablet, Refills: 0      CONTINUE these medications which have CHANGED   Details  amoxicillin-clavulanate (AUGMENTIN) 875-125 MG tablet Take 1 tablet by mouth 2 (two) times daily. Qty: 20 tablet, Refills: 0    atorvastatin (LIPITOR) 80 MG tablet Take 1 tablet (80 mg total) by mouth daily at 6 PM. Qty: 30 tablet, Refills: 0    cloNIDine (CATAPRES - DOSED IN MG/24 HR) 0.2 mg/24hr patch Place 1 patch (0.2 mg total) onto the skin once a week. Qty: 4 patch, Refills: 0      CONTINUE these medications which have NOT CHANGED  Details  aspirin EC 81 MG tablet Take 81 mg by mouth daily.    clopidogrel (PLAVIX) 75 MG tablet Take 75 mg by mouth daily.    furosemide (LASIX) 20 MG tablet Take 40 mg by mouth daily.     lisinopril (PRINIVIL,ZESTRIL) 40 MG tablet Take 40 mg by mouth daily.    metFORMIN (GLUCOPHAGE-XR) 500 MG 24 hr tablet Take 500 mg by  mouth daily with supper.    metoprolol succinate (TOPROL-XL) 100 MG 24 hr tablet Take 100 mg by mouth daily.    pantoprazole (PROTONIX) 40 MG tablet Take 40 mg by mouth daily.    potassium chloride SA (K-DUR,KLOR-CON) 20 MEQ tablet Take 20 mEq by mouth daily.    promethazine (PHENERGAN) 25 MG tablet Take 25 mg by mouth every 8 (eight) hours as needed for nausea or vomiting.    triamcinolone ointment (KENALOG) 0.5 % Apply 1 application topically 2 (two) times daily.      STOP taking these medications     cloNIDine (CATAPRES - DOSED IN MG/24 HR) 0.1 mg/24hr patch      insulin NPH-regular Human (NOVOLIN 70/30) (70-30) 100 UNIT/ML injection          DISCHARGE INSTRUCTIONS:    Activity as tolerated, as recommended by physical therapy Continue home health with PT and RN Diet low-salt, diabetic diet Follow-up with primary care physician Dr. Ola Spurr in a week Follow-up with vascular surgeon Dr. Debera Lat in a week Outpatient follow-up with diabetic clinic-lifestyle in a week Keep legs elevated Continue wound care as recommended by wound care nurse  DIET:  Diabetic diet  DISCHARGE CONDITION:  Fair  ACTIVITY:  Activity as tolerated as recommended by physical therapy  OXYGEN:  Home Oxygen: No.   Oxygen Delivery: room air  DISCHARGE LOCATION:  home with home health  If you experience worsening of your admission symptoms, develop shortness of breath, life threatening emergency, suicidal or homicidal thoughts you must seek medical attention immediately by calling 911 or calling your MD immediately  if symptoms less severe.  You Must read complete instructions/literature along with all the possible adverse reactions/side effects for all the Medicines you take and that have been prescribed to you. Take any new Medicines after you have completely understood and accpet all the possible adverse reactions/side effects.   Please note  You were cared for by a hospitalist during  your hospital stay. If you have any questions about your discharge medications or the care you received while you were in the hospital after you are discharged, you can call the unit and asked to speak with the hospitalist on call if the hospitalist that took care of you is not available. Once you are discharged, your primary care physician will handle any further medical issues. Please note that NO REFILLS for any discharge medications will be authorized once you are discharged, as it is imperative that you return to your primary care physician (or establish a relationship with a primary care physician if you do not have one) for your aftercare needs so that they can reassess your need for medications and monitor your lab values.     Today  Chief Complaint  Patient presents with  . Wound Check   Patient is feeling fine. Denies any headache or shortness of breath. Initially thought of going to rehabilitation but changed her mind and decided to go home with home health  ROS:  CONSTITUTIONAL: Denies fevers, chills. Denies any fatigue, weakness.  EYES: Denies blurry vision, double vision,  eye pain. EARS, NOSE, THROAT: Denies tinnitus, ear pain, hearing loss. RESPIRATORY: Denies cough, wheeze, shortness of breath.  CARDIOVASCULAR: Denies chest pain, palpitations, edema.  GASTROINTESTINAL: Denies nausea, vomiting, diarrhea, abdominal pain. Denies bright red blood per rectum. GENITOURINARY: Denies dysuria, hematuria. ENDOCRINE: Denies nocturia or thyroid problems. HEMATOLOGIC AND LYMPHATIC: Denies easy bruising or bleeding. SKIN: Denies rash or lesion. Chronic bilateral lower extremity edema with venous stasis with a recent left lower extremity wound with  discharge MUSCULOSKELETAL: Denies pain in neck, back, shoulder, knees, hips or arthritic symptoms.  NEUROLOGIC: Denies paralysis, paresthesias.  PSYCHIATRIC: Denies anxiety or depressive symptoms.   VITAL SIGNS:  Blood pressure 157/47, pulse  69, temperature 98.5 F (36.9 C), temperature source Oral, resp. rate 19, height 5\' 6"  (1.676 m), weight 146.512 kg (323 lb), SpO2 92 %.  I/O:    Intake/Output Summary (Last 24 hours) at 10/17/15 1322 Last data filed at 10/17/15 0934  Gross per 24 hour  Intake    580 ml  Output    300 ml  Net    280 ml    PHYSICAL EXAMINATION:  GENERAL:  64 y.o.-year-old patient lying in the bed with no acute distress.  EYES: Pupils equal, round, reactive to light and accommodation. No scleral icterus. Extraocular muscles intact.  HEENT: Head atraumatic, normocephalic. Oropharynx and nasopharynx clear.  NECK:  Supple, no jugular venous distention. No thyroid enlargement, no tenderness.  LUNGS: Normal breath sounds bilaterally, no wheezing, rales,rhonchi or crepitation. No use of accessory muscles of respiration.  CARDIOVASCULAR: S1, S2 normal. No murmurs, rubs, or gallops.  ABDOMEN: Soft, non-tender, non-distended. Bowel sounds present. No organomegaly or mass.  EXTREMITIES: No pedal edema, cyanosis, or clubbing.  NEUROLOGIC: Cranial nerves II through XII are intact. Muscle strength 5/5 in all extremities. Sensation intact. Gait not checked.  PSYCHIATRIC: The patient is alert and oriented x 3.  SKIN: No obvious rash, lesion, or ulcer. BL lower extremity is edematous, erythematous with chronic venous stasis, on the anterior portion of LLE  is 1 cm x 3 cm, on the medial portion is 3 cm x 3 cm , one on the posterior portion contains granulation tissue no drainage, covered with clean dressing and wrapped  DATA REVIEW:   CBC  Recent Labs Lab 10/16/15 0040  WBC 9.2  HGB 9.4*  HCT 29.2*  PLT 278    Chemistries   Recent Labs Lab 10/13/15 2000  10/16/15 0040  NA 140  < > 140  K 4.7  < > 3.8  CL 103  < > 104  CO2 29  < > 30  GLUCOSE 196*  < > 115*  BUN 21*  < > 15  CREATININE 1.03*  < > 1.17*  CALCIUM 8.9  < > 8.4*  AST 16  --   --   ALT 14  --   --   ALKPHOS 73  --   --   BILITOT 0.4   --   --   < > = values in this interval not displayed.  Cardiac Enzymes No results for input(s): TROPONINI in the last 168 hours.  Microbiology Results  Results for orders placed or performed during the hospital encounter of 10/13/15  Blood culture (routine x 2)     Status: None (Preliminary result)   Collection Time: 10/13/15  8:00 PM  Result Value Ref Range Status   Specimen Description BLOOD RIGHT FATTY CASTS  Final   Special Requests BOTTLES DRAWN AEROBIC AND ANAEROBIC 4CC  Final   Culture  NO GROWTH 4 DAYS  Final   Report Status PENDING  Incomplete  Blood culture (routine x 2)     Status: None (Preliminary result)   Collection Time: 10/13/15  8:02 PM  Result Value Ref Range Status   Specimen Description BLOOD LEFT ARM  Final   Special Requests BOTTLES DRAWN AEROBIC AND ANAEROBIC [place]  Final   Culture NO GROWTH 4 DAYS  Final   Report Status PENDING  Incomplete  Wound culture     Status: None   Collection Time: 10/13/15  8:19 PM  Result Value Ref Range Status   Specimen Description LEG LEFT LEG  Final   Special Requests Normal  Final   Gram Stain   Final    FEW WBC SEEN MODERATE GRAM NEGATIVE RODS MODERATE GRAM POSITIVE COCCI FEW GRAM POSITIVE RODS    Culture   Final    PROTEUS MIRABILIS MODERATE GROWTH MORGANELLA MORGANII    Report Status 10/16/2015 FINAL  Final   Organism ID, Bacteria PROTEUS MIRABILIS  Final   Organism ID, Bacteria MORGANELLA MORGANII  Final      Susceptibility   Morganella morganii - MIC*    AMPICILLIN RESISTANT Resistant     NITROFURANTOIN RESISTANT Resistant     CIPROFLOXACIN SENSITIVE Sensitive     TRIMETH/SULFA SENSITIVE Sensitive     CEFTRIAXONE SENSITIVE Sensitive     GENTAMICIN SENSITIVE Sensitive     * MODERATE GROWTH MORGANELLA MORGANII   Proteus mirabilis - MIC*    AMPICILLIN <=2 SENSITIVE Sensitive     CEFTAZIDIME <=1 SENSITIVE Sensitive     CEFAZOLIN 8 SENSITIVE Sensitive     CEFTRIAXONE <=1 SENSITIVE Sensitive     CIPROFLOXACIN  <=0.25 SENSITIVE Sensitive     GENTAMICIN <=1 SENSITIVE Sensitive     IMIPENEM 8 INTERMEDIATE Intermediate     TRIMETH/SULFA <=20 SENSITIVE Sensitive     PIP/TAZO Value in next row Sensitive      SENSITIVE<=4    * PROTEUS MIRABILIS  Anaerobic culture     Status: None   Collection Time: 10/13/15  8:19 PM  Result Value Ref Range Status   Specimen Description LEG LEFT  Final   Special Requests Normal  Final   Culture NO ANAEROBES ISOLATED  Final   Report Status 10/17/2015 FINAL  Final    RADIOLOGY:  Dg Tibia/fibula Left  10/13/2015  CLINICAL DATA:  Wound infection.  Evaluate for osteomyelitis EXAM: LEFT TIBIA AND FIBULA - 2 VIEW COMPARISON:  None. FINDINGS: No acute fracture or dislocation. No aggressive her lytic or sclerotic osseous lesion. No significant joint effusion. No periosteal reaction or bone destruction. Soft tissue wound along the posteromedial distal left lower leg. Generalized soft tissue edema. There is peripheral vascular atherosclerotic disease. IMPRESSION: No radiographic evidence of osteomyelitis of the left tibia or fibula. Soft tissue wound along the posterior medial left lower leg. Electronically Signed   By: Kathreen Devoid   On: 10/13/2015 20:17    EKG:   Orders placed or performed in visit on 02/18/15  . EKG 12-Lead      Management plans discussed with the patient, family and they are in agreement.  CODE STATUS:     Code Status Orders        Start     Ordered   10/13/15 2211  Full code   Continuous     10/13/15 2210      TOTAL TIME TAKING CARE OF THIS PATIENT: 45  minutes.    @[facility] @  on 10/17/2015 at  1:22 PM  Between 7am to 6pm - Pager - [phone]  After 6pm go to [email or web address] - password [email or web address] [facility]  [place] [facility]  Office  [phone]  CC: Primary care physician; Adrian Prows, MD

## 2015-10-17 NOTE — Progress Notes (Signed)
Physical Therapy Treatment Patient Details Name: Brooke Hopkins MRN: [id number] DOB: 09-Jul-1951 Today's Date: 10/17/2015    History of Present Illness Pt admitted to the hospital with bilateral LE cellulitis. Pt has recently been mostly wheelchair bound apart from transfers ever since she suffered a CVA 8 months ago    PT Comments    Pt able to progress mobility by getting up and transferring to recliner. She performed two transfers and was willing to progress therex. She believes that she will be able to perform transfers by herself, however she required significant assist for safe transfer with therapy today. There is also question about her husband's ability to assist her with transfers. Due to her decreased ability to transfer and decreased functional strength, she will continue to benefit from skilled PT in order to return her to PLOF and transferring safely. Pt very pleasant to work with.   Follow Up Recommendations  SNF (If refusing rehab, will need HHPT and mobility supervision)     Equipment Recommendations   (BRW)    Recommendations for Other Services       Precautions / Restrictions Precautions Precautions: Fall Restrictions Weight Bearing Restrictions: No    Mobility  Bed Mobility Overal bed mobility: Needs Assistance Bed Mobility: Supine to Sit     Supine to sit: Min assist     General bed mobility comments: Pt requires assist for LE management but she is able to use her hands to get trunk to upright and to EOB  Transfers Overall transfer level: Needs assistance Equipment used:  (BRW) Transfers: Sit to/from [facility] Sit to Stand: Mod assist;+2 physical assistance         General transfer comment: Pt transfers with assist for lifting trunk and getting fully into standing. Pt is flexed at the hips in standing and needs cues (both verbal and tactile) to push hips anterior and look up so that she is fully upright. Pt needs cues for hand  placement during transfers. Pt needs cues on pivoting to sit into recliner  Ambulation/Gait             General Gait Details: Pt only able to march in with increased time. Ambulation not appropriate this date   Stairs            Wheelchair Mobility    Modified Rankin (Stroke Patients Only)       Balance                                    Cognition Arousal/Alertness: Awake/alert Behavior During Therapy: WFL for tasks assessed/performed;Anxious Overall Cognitive Status: Within Functional Limits for tasks assessed                      Exercises Other Exercises Other Exercises: Pt performed bilateral LE therex x 12 reps at min A for facilitation of movement. Exercises performed: ankle pumps, quad sets, glute sets, SLR, hip abd, pillow squeeze and LAQ    General Comments        Pertinent Vitals/Pain Pain Assessment: 0-10 Pain Score: 3  Pain Intervention(s): Limited activity within patient's tolerance;Monitored during session    Home Living                      Prior Function            PT Goals (current goals can now be found in  the care plan section) Acute Rehab PT Goals Patient Stated Goal: to get to recliner PT Goal Formulation: With patient/family Time For Goal Achievement: 10/28/15 Potential to Achieve Goals: Fair Progress towards PT goals: Progressing toward goals    Frequency  Min 2X/week    PT Plan Current plan remains appropriate    Co-evaluation             End of Session Equipment Utilized During Treatment: Gait belt Activity Tolerance: Patient tolerated treatment well       Time: 1002-1030 PT Time Calculation (min) (ACUTE ONLY): 28 min  Charges:                       G CodesJanyth Contes 11-10-2015, 1:26 PM  Janyth Contes, SPT. [phone]

## 2015-10-17 NOTE — Care Management (Addendum)
Patient would like to stay with [facility] home health- I have notified [name] with [facility] to make sure they will take patient back. She also states that her wheelchair is a 20" wheelchair and it was supposed to a 22" due to her size (I confirmed with [facility] [phone] measurement). She states that the inside plastic is broken due to her size. She also states that the wheelchair was supposed to have elevated legs. Rx needed for "elevated leg rests- both legs". [facility] will go out and look at wheelchair and "switch it out". RNCM to fax Rx to [place].

## 2015-10-17 NOTE — Progress Notes (Signed)
Clinical Education officer, museum (CSW) met with patient to discuss rehab again. Patient refused SNF and reported that she is going home and her husband can pick her up. RN Case Manager and MD are aware of above.   Blima Rich, [place] [phone]

## 2015-10-17 NOTE — Progress Notes (Signed)
Order from MD to discharge patient to home today with home health, discharge instructions given per MD, home and new medications reviewed with patient, rx.slips given, patient voiced understanding of discharge instructions given.  Spouse to take patient home.

## 2015-10-17 NOTE — Care Management (Signed)
Patient discharging home today. I have faxed orders to Brooke Hopkins with [facility] home [facility]. I have also notified [name] of patient discharge today. No further RNCM needs. Case closed.

## 2015-10-17 NOTE — Progress Notes (Signed)
Order to teach caretaker to do the daily wound care ble, spouse at the bedside. Dressing changed with spouse per wound care order. Dressing supplies given for home. Spouse voiced understanding of dressing change instructions.

## 2015-10-18 LAB — CULTURE, BLOOD (ROUTINE X 2)
Culture: NO GROWTH
Culture: NO GROWTH

## 2015-10-25 DIAGNOSIS — I10 Essential (primary) hypertension: Secondary | ICD-10-CM | POA: Diagnosis not present

## 2015-10-25 DIAGNOSIS — E1142 Type 2 diabetes mellitus with diabetic polyneuropathy: Secondary | ICD-10-CM | POA: Diagnosis not present

## 2015-10-25 DIAGNOSIS — Z794 Long term (current) use of insulin: Secondary | ICD-10-CM | POA: Diagnosis not present

## 2015-10-25 DIAGNOSIS — R600 Localized edema: Secondary | ICD-10-CM | POA: Diagnosis not present

## 2015-10-25 DIAGNOSIS — E1129 Type 2 diabetes mellitus with other diabetic kidney complication: Secondary | ICD-10-CM | POA: Diagnosis not present

## 2015-10-25 DIAGNOSIS — R809 Proteinuria, unspecified: Secondary | ICD-10-CM | POA: Diagnosis not present

## 2015-10-25 DIAGNOSIS — I83029 Varicose veins of left lower extremity with ulcer of unspecified site: Secondary | ICD-10-CM | POA: Diagnosis not present

## 2015-10-30 DIAGNOSIS — I10 Essential (primary) hypertension: Secondary | ICD-10-CM | POA: Diagnosis not present

## 2015-10-30 DIAGNOSIS — I872 Venous insufficiency (chronic) (peripheral): Secondary | ICD-10-CM | POA: Diagnosis not present

## 2015-10-30 DIAGNOSIS — R600 Localized edema: Secondary | ICD-10-CM | POA: Diagnosis not present

## 2015-10-30 DIAGNOSIS — E119 Type 2 diabetes mellitus without complications: Secondary | ICD-10-CM | POA: Diagnosis not present

## 2015-12-27 DIAGNOSIS — E1142 Type 2 diabetes mellitus with diabetic polyneuropathy: Secondary | ICD-10-CM | POA: Diagnosis not present

## 2015-12-27 DIAGNOSIS — I83023 Varicose veins of left lower extremity with ulcer of ankle: Secondary | ICD-10-CM | POA: Diagnosis not present

## 2016-01-03 DIAGNOSIS — R600 Localized edema: Secondary | ICD-10-CM | POA: Diagnosis not present

## 2016-01-03 DIAGNOSIS — I10 Essential (primary) hypertension: Secondary | ICD-10-CM | POA: Diagnosis not present

## 2016-01-03 DIAGNOSIS — I83023 Varicose veins of left lower extremity with ulcer of ankle: Secondary | ICD-10-CM | POA: Diagnosis not present

## 2016-01-22 DIAGNOSIS — Z794 Long term (current) use of insulin: Secondary | ICD-10-CM | POA: Diagnosis not present

## 2016-01-22 DIAGNOSIS — R600 Localized edema: Secondary | ICD-10-CM | POA: Diagnosis not present

## 2016-01-22 DIAGNOSIS — L97909 Non-pressure chronic ulcer of unspecified part of unspecified lower leg with unspecified severity: Secondary | ICD-10-CM | POA: Diagnosis not present

## 2016-01-22 DIAGNOSIS — E1129 Type 2 diabetes mellitus with other diabetic kidney complication: Secondary | ICD-10-CM | POA: Diagnosis not present

## 2016-01-22 DIAGNOSIS — I83009 Varicose veins of unspecified lower extremity with ulcer of unspecified site: Secondary | ICD-10-CM | POA: Diagnosis not present

## 2016-01-22 DIAGNOSIS — I10 Essential (primary) hypertension: Secondary | ICD-10-CM | POA: Diagnosis not present

## 2016-01-22 DIAGNOSIS — R809 Proteinuria, unspecified: Secondary | ICD-10-CM | POA: Diagnosis not present

## 2016-01-31 DIAGNOSIS — E119 Type 2 diabetes mellitus without complications: Secondary | ICD-10-CM | POA: Diagnosis not present

## 2016-01-31 DIAGNOSIS — I872 Venous insufficiency (chronic) (peripheral): Secondary | ICD-10-CM | POA: Diagnosis not present

## 2016-01-31 DIAGNOSIS — I10 Essential (primary) hypertension: Secondary | ICD-10-CM | POA: Diagnosis not present

## 2016-01-31 DIAGNOSIS — L97211 Non-pressure chronic ulcer of right calf limited to breakdown of skin: Secondary | ICD-10-CM | POA: Diagnosis not present

## 2016-01-31 DIAGNOSIS — Z794 Long term (current) use of insulin: Secondary | ICD-10-CM | POA: Diagnosis not present

## 2016-01-31 DIAGNOSIS — L97221 Non-pressure chronic ulcer of left calf limited to breakdown of skin: Secondary | ICD-10-CM | POA: Diagnosis not present

## 2016-01-31 DIAGNOSIS — L97821 Non-pressure chronic ulcer of other part of left lower leg limited to breakdown of skin: Secondary | ICD-10-CM | POA: Diagnosis not present

## 2016-02-13 DIAGNOSIS — I83023 Varicose veins of left lower extremity with ulcer of ankle: Secondary | ICD-10-CM | POA: Diagnosis not present

## 2016-02-13 DIAGNOSIS — G6289 Other specified polyneuropathies: Secondary | ICD-10-CM | POA: Diagnosis not present

## 2016-02-13 DIAGNOSIS — R600 Localized edema: Secondary | ICD-10-CM | POA: Diagnosis not present

## 2016-02-13 DIAGNOSIS — E1129 Type 2 diabetes mellitus with other diabetic kidney complication: Secondary | ICD-10-CM | POA: Diagnosis not present

## 2016-02-13 DIAGNOSIS — R809 Proteinuria, unspecified: Secondary | ICD-10-CM | POA: Diagnosis not present

## 2016-02-13 DIAGNOSIS — Z794 Long term (current) use of insulin: Secondary | ICD-10-CM | POA: Diagnosis not present

## 2016-02-26 DIAGNOSIS — E1142 Type 2 diabetes mellitus with diabetic polyneuropathy: Secondary | ICD-10-CM | POA: Diagnosis not present

## 2016-02-26 DIAGNOSIS — E1129 Type 2 diabetes mellitus with other diabetic kidney complication: Secondary | ICD-10-CM | POA: Diagnosis not present

## 2016-02-26 DIAGNOSIS — I739 Peripheral vascular disease, unspecified: Secondary | ICD-10-CM | POA: Diagnosis not present

## 2016-02-26 DIAGNOSIS — Z794 Long term (current) use of insulin: Secondary | ICD-10-CM | POA: Diagnosis not present

## 2016-02-26 DIAGNOSIS — R809 Proteinuria, unspecified: Secondary | ICD-10-CM | POA: Diagnosis not present

## 2016-02-26 DIAGNOSIS — I10 Essential (primary) hypertension: Secondary | ICD-10-CM | POA: Diagnosis not present

## 2016-02-26 DIAGNOSIS — E113313 Type 2 diabetes mellitus with moderate nonproliferative diabetic retinopathy with macular edema, bilateral: Secondary | ICD-10-CM | POA: Diagnosis not present

## 2016-03-06 DIAGNOSIS — E1142 Type 2 diabetes mellitus with diabetic polyneuropathy: Secondary | ICD-10-CM | POA: Diagnosis not present

## 2016-03-06 DIAGNOSIS — I83009 Varicose veins of unspecified lower extremity with ulcer of unspecified site: Secondary | ICD-10-CM | POA: Diagnosis not present

## 2016-03-06 DIAGNOSIS — R600 Localized edema: Secondary | ICD-10-CM | POA: Diagnosis not present

## 2016-03-06 DIAGNOSIS — L97909 Non-pressure chronic ulcer of unspecified part of unspecified lower leg with unspecified severity: Secondary | ICD-10-CM | POA: Diagnosis not present

## 2016-04-03 DIAGNOSIS — I890 Lymphedema, not elsewhere classified: Secondary | ICD-10-CM | POA: Diagnosis not present

## 2016-04-03 DIAGNOSIS — I83023 Varicose veins of left lower extremity with ulcer of ankle: Secondary | ICD-10-CM | POA: Diagnosis not present

## 2016-04-04 DIAGNOSIS — M79609 Pain in unspecified limb: Secondary | ICD-10-CM | POA: Diagnosis not present

## 2016-04-04 DIAGNOSIS — M7989 Other specified soft tissue disorders: Secondary | ICD-10-CM | POA: Diagnosis not present

## 2016-04-04 DIAGNOSIS — L97209 Non-pressure chronic ulcer of unspecified calf with unspecified severity: Secondary | ICD-10-CM | POA: Diagnosis not present

## 2016-04-04 DIAGNOSIS — I83009 Varicose veins of unspecified lower extremity with ulcer of unspecified site: Secondary | ICD-10-CM | POA: Diagnosis not present

## 2016-04-04 DIAGNOSIS — I890 Lymphedema, not elsewhere classified: Secondary | ICD-10-CM | POA: Diagnosis not present

## 2016-04-26 DIAGNOSIS — R809 Proteinuria, unspecified: Secondary | ICD-10-CM | POA: Diagnosis not present

## 2016-04-26 DIAGNOSIS — L97909 Non-pressure chronic ulcer of unspecified part of unspecified lower leg with unspecified severity: Secondary | ICD-10-CM | POA: Diagnosis not present

## 2016-04-26 DIAGNOSIS — I890 Lymphedema, not elsewhere classified: Secondary | ICD-10-CM | POA: Diagnosis not present

## 2016-04-26 DIAGNOSIS — E1129 Type 2 diabetes mellitus with other diabetic kidney complication: Secondary | ICD-10-CM | POA: Diagnosis not present

## 2016-04-26 DIAGNOSIS — Z794 Long term (current) use of insulin: Secondary | ICD-10-CM | POA: Diagnosis not present

## 2016-04-26 DIAGNOSIS — I83009 Varicose veins of unspecified lower extremity with ulcer of unspecified site: Secondary | ICD-10-CM | POA: Diagnosis not present

## 2016-05-22 DIAGNOSIS — L02419 Cutaneous abscess of limb, unspecified: Secondary | ICD-10-CM | POA: Insufficient documentation

## 2016-05-22 DIAGNOSIS — I83009 Varicose veins of unspecified lower extremity with ulcer of unspecified site: Secondary | ICD-10-CM | POA: Diagnosis not present

## 2016-05-22 DIAGNOSIS — L97909 Non-pressure chronic ulcer of unspecified part of unspecified lower leg with unspecified severity: Secondary | ICD-10-CM | POA: Diagnosis not present

## 2016-05-22 DIAGNOSIS — Z794 Long term (current) use of insulin: Secondary | ICD-10-CM | POA: Diagnosis not present

## 2016-05-22 DIAGNOSIS — I890 Lymphedema, not elsewhere classified: Secondary | ICD-10-CM | POA: Diagnosis not present

## 2016-05-22 DIAGNOSIS — E1129 Type 2 diabetes mellitus with other diabetic kidney complication: Secondary | ICD-10-CM | POA: Diagnosis not present

## 2016-05-22 DIAGNOSIS — R809 Proteinuria, unspecified: Secondary | ICD-10-CM | POA: Diagnosis not present

## 2016-05-22 DIAGNOSIS — R600 Localized edema: Secondary | ICD-10-CM | POA: Diagnosis not present

## 2016-05-22 DIAGNOSIS — L03119 Cellulitis of unspecified part of limb: Secondary | ICD-10-CM | POA: Insufficient documentation

## 2016-06-17 DIAGNOSIS — L02419 Cutaneous abscess of limb, unspecified: Secondary | ICD-10-CM | POA: Diagnosis not present

## 2016-06-17 DIAGNOSIS — L03119 Cellulitis of unspecified part of limb: Secondary | ICD-10-CM | POA: Diagnosis not present

## 2016-06-17 DIAGNOSIS — Z794 Long term (current) use of insulin: Secondary | ICD-10-CM | POA: Diagnosis not present

## 2016-06-17 DIAGNOSIS — Z6841 Body Mass Index (BMI) 40.0 and over, adult: Secondary | ICD-10-CM | POA: Diagnosis not present

## 2016-06-17 DIAGNOSIS — I83009 Varicose veins of unspecified lower extremity with ulcer of unspecified site: Secondary | ICD-10-CM | POA: Diagnosis not present

## 2016-06-17 DIAGNOSIS — E1129 Type 2 diabetes mellitus with other diabetic kidney complication: Secondary | ICD-10-CM | POA: Diagnosis not present

## 2016-06-17 DIAGNOSIS — R809 Proteinuria, unspecified: Secondary | ICD-10-CM | POA: Diagnosis not present

## 2016-06-17 DIAGNOSIS — I890 Lymphedema, not elsewhere classified: Secondary | ICD-10-CM | POA: Diagnosis not present

## 2016-06-17 DIAGNOSIS — L97909 Non-pressure chronic ulcer of unspecified part of unspecified lower leg with unspecified severity: Secondary | ICD-10-CM | POA: Diagnosis not present

## 2016-06-21 ENCOUNTER — Encounter: Payer: 59 | Attending: Surgery | Admitting: Surgery

## 2016-06-21 DIAGNOSIS — E11622 Type 2 diabetes mellitus with other skin ulcer: Secondary | ICD-10-CM | POA: Insufficient documentation

## 2016-06-21 DIAGNOSIS — I890 Lymphedema, not elsewhere classified: Secondary | ICD-10-CM | POA: Insufficient documentation

## 2016-06-21 DIAGNOSIS — Z87891 Personal history of nicotine dependence: Secondary | ICD-10-CM | POA: Insufficient documentation

## 2016-06-21 DIAGNOSIS — L97811 Non-pressure chronic ulcer of other part of right lower leg limited to breakdown of skin: Secondary | ICD-10-CM | POA: Diagnosis not present

## 2016-06-21 DIAGNOSIS — L97212 Non-pressure chronic ulcer of right calf with fat layer exposed: Secondary | ICD-10-CM | POA: Diagnosis not present

## 2016-06-21 DIAGNOSIS — L97821 Non-pressure chronic ulcer of other part of left lower leg limited to breakdown of skin: Secondary | ICD-10-CM | POA: Diagnosis not present

## 2016-06-21 DIAGNOSIS — E1140 Type 2 diabetes mellitus with diabetic neuropathy, unspecified: Secondary | ICD-10-CM | POA: Insufficient documentation

## 2016-06-21 DIAGNOSIS — Z6841 Body Mass Index (BMI) 40.0 and over, adult: Secondary | ICD-10-CM | POA: Insufficient documentation

## 2016-06-21 DIAGNOSIS — L97222 Non-pressure chronic ulcer of left calf with fat layer exposed: Secondary | ICD-10-CM | POA: Insufficient documentation

## 2016-06-21 NOTE — Progress Notes (Signed)
Brooke Hopkins, Brooke Hopkins ([id number]) Visit Report for 06/21/2016 Abuse/Suicide Risk Screen Details Patient Name: Brooke Hopkins, Brooke Hopkins. Date of Service: 06/21/2016 8:00 AM Medical Record Number: [id number] Patient Account Number: [id number] Date of Birth/Sex: 1951/08/29 (64 y.o. Female) Treating RN: Macarthur Critchley Primary Care Physician: FITZGERALD, DAVID Other Clinician: Referring Physician: FITZGERALD, DAVID Treating Physician/Extender: Frann Rider in Treatment: 0 Abuse/Suicide Risk Screen Items Answer ABUSE/SUICIDE RISK SCREEN: Has anyone close to you tried to hurt or harm you recentlyo No Do you feel uncomfortable with anyone in your familyo No Has anyone forced you do things that you didnot want to doo No Do you have any thoughts of harming yourselfo No Patient displays signs or symptoms of abuse and/or neglect. No Electronic Signature(s) Signed: 06/21/2016 4:06:39 PM By: Rebecca Eaton RN, Sendra Entered By: Rebecca Eaton RN, Sendra on 06/21/2016 08:36:58 Brooke Hopkins ([id number]) -------------------------------------------------------------------------------- Activities of Daily Living Details Patient Name: Brooke Hopkins, Brooke Hopkins. Date of Service: 06/21/2016 8:00 AM Medical Record Number: [id number] Patient Account Number: [id number] Date of Birth/Sex: 01/11/1951 (64 y.o. Female) Treating RN: Macarthur Critchley Primary Care Physician: FITZGERALD, DAVID Other Clinician: Referring Physician: FITZGERALD, DAVID Treating Physician/Extender: Frann Rider in Treatment: 0 Activities of Daily Living Items Answer Activities of Daily Living (Please select one for each item) Drive Automobile Not Able Take Medications Completely Able Use Telephone Completely Able Care for Appearance Completely Able Use Toilet Completely Able Bath / Shower Completely Able Dress Self Completely Able Feed Self Completely Able Walk Need Assistance Get In / Out Bed Completely [place] for Self Need Assistance Electronic Signature(s) Signed: 06/21/2016 4:06:39 PM By: Rebecca Eaton, RN, Sendra Entered By: Rebecca Eaton RN, Sendra on 06/21/2016 08:38:29 Brooke Hopkins ([id number]) -------------------------------------------------------------------------------- Education Assessment Details Patient Name: Brooke Hopkins, Brooke Hopkins. Date of Service: 06/21/2016 8:00 AM Medical Record Number: [id number] Patient Account Number: [id number] Date of Birth/Sex: 1951-10-12 (64 y.o. Female) Treating RN: Macarthur Critchley Primary Care Physician: FITZGERALD, DAVID Other Clinician: Referring Physician: FITZGERALD, DAVID Treating Physician/Extender: Frann Rider in Treatment: 0 Primary Learner Assessed: Patient Learning Preferences/Education Level/Primary Language Learning Preference: Explanation Preferred Language: English Cognitive Barrier Assessment/Beliefs Language Barrier: No Translator Needed: No Memory Deficit: No Emotional Barrier: No Cultural/Religious Beliefs Affecting Medical No Care: Physical Barrier Assessment Impaired Vision: Yes Glasses, reading Impaired Hearing: No Decreased Hand dexterity: No Knowledge/Comprehension Assessment Knowledge Level: High Comprehension Level: High Ability to understand written High instructions: Ability to understand verbal High instructions: Motivation Assessment Anxiety Level: Calm Cooperation: Cooperative Education Importance: Acknowledges Need Interest in Health Problems: Asks Questions Perception: Coherent Willingness to Engage in Self- High Management Activities: Readiness to Engage in Self- High Management Activities: Electronic Signature(s) Signed: 06/21/2016 4:06:39 PM By: Rebecca Eaton, RN, Marikay Alar, Brooke M. ([id number]) Entered By: Rebecca Eaton RN, Sendra on 06/21/2016 08:40:06 Brooke Hopkins, Brooke Hopkins  ([id number]) -------------------------------------------------------------------------------- Fall Risk Assessment Details Patient Name: Brooke Hopkins. Date of Service: 06/21/2016 8:00 AM Medical Record Number: [id number] Patient Account Number: [id number] Date of Birth/Sex: Dec 03, 1951 (64 y.o. Female) Treating RN: Macarthur Critchley Primary Care Physician: FITZGERALD, DAVID Other Clinician: Referring Physician: FITZGERALD, DAVID Treating Physician/Extender: Frann Rider in Treatment: 0 Fall Risk Assessment Items Have you had 2 or more falls in the last 12 monthso 0 No Have you had any fall that resulted in injury in the last 12 monthso 0 No FALL RISK ASSESSMENT: History of falling - immediate or within 3 months 0 No Secondary diagnosis 15 Yes Ambulatory aid None/bed rest/wheelchair/nurse 0 Yes Crutches/cane/walker 0 No  Furniture 0 No IV Access/Saline Lock 0 No Gait/Training Normal/bed rest/immobile 0 Yes Weak 10 Yes Impaired 0 No Mental Status Oriented to own ability 0 Yes Electronic Signature(s) Signed: 06/21/2016 4:06:39 PM By: Rebecca Eaton, RN, Sendra Entered By: Rebecca Eaton RN, Sendra on 06/21/2016 08:40:23 Brooke Hopkins ([id number]) -------------------------------------------------------------------------------- Foot Assessment Details Patient Name: Brooke Hopkins, Brooke Hopkins. Date of Service: 06/21/2016 8:00 AM Medical Record Number: [id number] Patient Account Number: [id number] Date of Birth/Sex: 22-May-1951 (64 y.o. Female) Treating RN: Macarthur Critchley Primary Care Physician: FITZGERALD, DAVID Other Clinician: Referring Physician: FITZGERALD, DAVID Treating Physician/Extender: Frann Rider in Treatment: 0 Foot Assessment Items Site Locations + = Sensation present, - = Sensation absent, C = Callus, U = Ulcer R = Redness, W = Warmth, M = Maceration, PU = Pre-ulcerative lesion F = Fissure, S = Swelling, D = Dryness Assessment Right: Left: Other Deformity: No  No Prior Foot Ulcer: No No Prior Amputation: No No Charcot Joint: No No Ambulatory Status: Non-ambulatory Assistance Device: Wheelchair Gait: Engineer, maintenance) Signed: 06/21/2016 4:06:39 PM By: Rebecca Eaton, RN, Sendra Entered By: Rebecca Eaton RN, Sendra on 06/21/2016 09:02:10 Brooke Hopkins ([id number]) -------------------------------------------------------------------------------- Nutrition Risk Assessment Details Patient Name: Brooke Hopkins, Brooke Hopkins. Date of Service: 06/21/2016 8:00 AM Medical Record Number: [id number] Patient Account Number: [id number] Date of Birth/Sex: Sep 15, 1951 (64 y.o. Female) Treating RN: Macarthur Critchley Primary Care Physician: FITZGERALD, DAVID Other Clinician: Referring Physician: FITZGERALD, DAVID Treating Physician/Extender: Frann Rider in Treatment: 0 Height (in): 67 Weight (lbs): 300 Body Mass Index (BMI): 47 Nutrition Risk Assessment Items NUTRITION RISK SCREEN: I have an illness or condition that made me change the kind and/or 0 No amount of food I eat I eat fewer than two meals per day 0 No I eat few fruits and vegetables, or milk products 0 No I have three or more drinks of beer, liquor or wine almost every day 0 No I have tooth or mouth problems that make it hard for me to eat 0 No I don't always have enough money to buy the food I need 0 No I eat alone most of the time 0 No I take three or more different prescribed or over-the-counter drugs a 1 Yes day Without wanting to, I have lost or gained 10 pounds in the last six 0 No months I am not always physically able to shop, cook and/or feed myself 0 No Nutrition Protocols Good Risk Protocol 0 No interventions needed Moderate Risk Protocol Electronic Signature(s) Signed: 06/21/2016 4:06:39 PM By: Rebecca Eaton RN, Sendra Entered By: Rebecca Eaton RN, Sendra on 06/21/2016 08:40:44

## 2016-06-21 NOTE — Progress Notes (Addendum)
Brooke Hopkins ([id number]) Visit Report for 06/21/2016 Chief Complaint Document Details Patient Name: Brooke Hopkins, Brooke Hopkins. Date of Service: 06/21/2016 8:00 AM Medical Record Number: [id number] Patient Account Number: [id number] Date of Birth/Sex: Jun 17, 1951 (64 y.o. Female) Treating RN: Macarthur Critchley Primary Care Physician: FITZGERALD, DAVID Other Clinician: Referring Physician: FITZGERALD, DAVID Treating Physician/Extender: Frann Rider in Treatment: 0 Information Obtained from: Patient Chief Complaint Patients presents for treatment of an open diabetic ulcer and significant lymphedema which she's had for about 12 years Electronic Signature(s) Signed: 06/21/2016 9:26:47 AM By: Christin Fudge MD, FACS Entered By: Christin Fudge on 06/21/2016 09:26:47 Brooke Hopkins ([id number]) -------------------------------------------------------------------------------- Debridement Details Patient Name: Brooke Hopkins, Brooke Hopkins. Date of Service: 06/21/2016 8:00 AM Medical Record Number: [id number] Patient Account Number: [id number] Date of Birth/Sex: 1951-08-11 (64 y.o. Female) Treating RN: Macarthur Critchley Primary Care Physician: FITZGERALD, DAVID Other Clinician: Referring Physician: FITZGERALD, DAVID Treating Physician/Extender: Frann Rider in Treatment: 0 Debridement Performed for Wound #1 Left,Circumferential Lower Leg Assessment: Performed By: Physician Christin Fudge, MD Debridement: Debridement Pre-procedure Yes Verification/Time Out Taken: Start Time: 09:15 Pain Control: Lidocaine 4% Topical Solution Level: Skin/Subcutaneous Tissue Total Area Debrided (L x 4 (cm) x 5 (cm) = 20 (cm) W): Tissue and other Fibrin/Slough, Subcutaneous material debrided: Instrument: Curette Bleeding: Minimum End Time: 09:16 Procedural Pain: 4 Post Procedural Pain: 4 Response to Treatment: Procedure was tolerated well Post Debridement Measurements of Total Wound Length: (cm)  8 Width: (cm) 16 Depth: (cm) 0.2 Volume: (cm) 20.106 Post Procedure Diagnosis Same as Pre-procedure Electronic Signature(s) Signed: 06/21/2016 9:25:49 AM By: Christin Fudge MD, FACS Signed: 06/21/2016 4:06:39 PM By: Rebecca Eaton RN, Sendra Entered By: Christin Fudge on 06/21/2016 09:25:49 Brooke Hopkins ([id number]) -------------------------------------------------------------------------------- Debridement Details Patient Name: Brooke Hopkins, Brooke Hopkins. Date of Service: 06/21/2016 8:00 AM Medical Record Number: [id number] Patient Account Number: [id number] Date of Birth/Sex: Nov 15, 1951 (64 y.o. Female) Treating RN: Macarthur Critchley Primary Care Physician: FITZGERALD, DAVID Other Clinician: Referring Physician: FITZGERALD, DAVID Treating Physician/Extender: Frann Rider in Treatment: 0 Debridement Performed for Wound #2 Right,Circumferential Lower Leg Assessment: Performed By: Physician Christin Fudge, MD Debridement: Debridement Pre-procedure Yes Verification/Time Out Taken: Start Time: 09:15 Pain Control: Lidocaine 4% Topical Solution Level: Skin/Subcutaneous Tissue Total Area Debrided (L x 3 (cm) x 2 (cm) = 6 (cm) W): Tissue and other Viable, Non-Viable, Fibrin/Slough, Subcutaneous material debrided: Instrument: Curette Bleeding: Minimum End Time: 09:16 Procedural Pain: 4 Post Procedural Pain: 4 Response to Treatment: Procedure was tolerated well Post Debridement Measurements of Total Wound Length: (cm) 16 Width: (cm) 18 Depth: (cm) 0.2 Volume: (cm) 45.239 Post Procedure Diagnosis Same as Pre-procedure Electronic Signature(s) Signed: 06/21/2016 9:26:26 AM By: Christin Fudge MD, FACS Signed: 06/21/2016 4:06:39 PM By: Rebecca Eaton RN, Sendra Entered By: Christin Fudge on 06/21/2016 09:26:26 Brooke Hopkins ([id number]) -------------------------------------------------------------------------------- HPI Details Patient Name: Brooke Hopkins, Brooke Hopkins. Date of Service:  06/21/2016 8:00 AM Medical Record Number: [id number] Patient Account Number: [id number] Date of Birth/Sex: 20-Apr-1951 (64 y.o. Female) Treating RN: Macarthur Critchley Primary Care Physician: FITZGERALD, DAVID Other Clinician: Referring Physician: FITZGERALD, DAVID Treating Physician/Extender: Frann Rider in Treatment: 0 History of Present Illness Location: massive swelling of both lower extremities and ulceration both lower extremities Quality: Patient reports experiencing a dull pain to affected area(s). Severity: Patient states wound are getting worse. Duration: Patient has had the wound for >12 years prior to seeking treatment at the wound center Timing: Pain in wound is constant (hurts all the time) Context: The wound appeared gradually over time Modifying Factors: Other treatment(s) tried  include:she has a lymphedema pump but uses it seldom and she's had several course of antibiotics Associated Signs and Symptoms: Patient reports having difficulty standing for long periods. HPI Description: 65 year old patient seen by Dr. Ola Spurr of infectious disease who has been following up for left lower extremity cellulitis and ulcer with recurrent bilateral lower extremity problems for several months. Recently she had a large right lower extremity bullae which opened out and has been ulcerated. She has seen the vascular group and has been getting Unna's wraps and has a lymphedema pump used in the past. Her prior cultures were positive for Pseudomonas, Proteus and was treated with amoxicillin. He has also been treated with 2 weeks course of ciprofloxacin and amoxicillin.. Increase of Lasix dose helped with the edema and echo showed no systolic CHF but may be diastolic problems. Past medical history significant for diabetes mellitus type 2, venous stasis ulcer, obesity, diabetic peripheral neuropathy, status post back surgery, cholecystectomy, hysterectomy, arthroscopy of the knee. He is  a former smoker and quit smoking in 1984. The patient has seen Dr. Delana Meyer who did not recommend any arterial or venous duplex studies and has been using Unna's wraps and also recommended a lymphedema pump. she has been very noncompliant with using these. Electronic Signature(s) Signed: 06/21/2016 9:28:50 AM By: Christin Fudge MD, FACS Previous Signature: 06/21/2016 9:01:43 AM Version By: Christin Fudge MD, FACS Previous Signature: 06/21/2016 8:51:23 AM Version By: Christin Fudge MD, FACS Previous Signature: 06/21/2016 8:34:35 AM Version By: Christin Fudge MD, FACS Entered By: Christin Fudge on 06/21/2016 09:28:50 Brooke Hopkins ([id number]) -------------------------------------------------------------------------------- Physical Exam Details Patient Name: Brooke Hopkins, Brooke Hopkins. Date of Service: 06/21/2016 8:00 AM Medical Record Number: [id number] Patient Account Number: [id number] Date of Birth/Sex: 1951/05/29 (64 y.o. Female) Treating RN: Macarthur Critchley Primary Care Physician: FITZGERALD, DAVID Other Clinician: Referring Physician: FITZGERALD, DAVID Treating Physician/Extender: Christin Fudge Weeks in Treatment: 0 Constitutional . Pulse regular. Respirations normal and unlabored. Afebrile. . Eyes Nonicteric. Reactive to light. Ears, Nose, Mouth, and Throat Lips, teeth, and gums WNL.Marland Kitchen Moist mucosa without lesions. Neck supple and nontender. No palpable supraclavicular or cervical adenopathy. Normal sized without goiter. Respiratory WNL. No retractions.. Cardiovascular PT pulses are not palpable and ABI could not be measured. she has massive stage II lymphedema both lower extremities. Gastrointestinal (GI) Abdomen without masses or tenderness.. No liver or spleen enlargement or tenderness.. Lymphatic No adneopathy. No adenopathy. No adenopathy. Musculoskeletal Adexa without tenderness or enlargement.. Digits and nails w/o clubbing, cyanosis, infection, petechiae, ischemia, or  inflammatory conditions.. Integumentary (Hair, Skin) No suspicious lesions. No crepitus or fluctuance. No peri-wound warmth or erythema. No masses.Marland Kitchen Psychiatric Judgement and insight Intact.. No evidence of depression, anxiety, or agitation.. Notes the patient has massive stage II lymphedema which is fibrotic and has dermal thickening and was a posterior and of both lower extremities she has got large ulcerations which have significant amount of slough and with a #3 curetted I sharply removed as much as I could under local anesthesia with lidocaine gel. Minimal bleeding controlled with pressure Electronic Signature(s) Signed: 06/21/2016 9:30:09 AM By: Christin Fudge MD, FACS Entered By: Christin Fudge on 06/21/2016 09:30:08 Brooke Hopkins ([id number]) -------------------------------------------------------------------------------- Physician Orders Details Patient Name: Brooke Hopkins, Brooke Hopkins. Date of Service: 06/21/2016 8:00 AM Medical Record Number: [id number] Patient Account Number: [id number] Date of Birth/Sex: 14-Aug-1951 (64 y.o. Female) Treating RN: Macarthur Critchley Primary Care Physician: FITZGERALD, DAVID Other Clinician: Referring Physician: FITZGERALD, DAVID Treating Physician/Extender: Frann Rider in Treatment: 0 Verbal / Phone Orders: Yes Clinician:  Macarthur Critchley Read Back and Verified: Yes Diagnosis Coding Wound Cleansing Wound #1 Left,Circumferential Lower Leg o May shower with protection. Wound #2 Right,Circumferential Lower Leg o May shower with protection. Primary Wound Dressing Wound #1 Left,Circumferential Lower Leg o Aquacel Ag Wound #2 Right,Circumferential Lower Leg o Aquacel Ag Secondary Dressing Wound #1 Left,Circumferential Lower Leg o ABD pad o Dry Gauze Wound #2 Right,Circumferential Lower Leg o ABD pad o Dry Gauze Dressing Change Frequency Wound #1 Left,Circumferential Lower Leg o Change Dressing Monday, Wednesday,  Friday Wound #2 Right,Circumferential Lower Leg o Change Dressing Monday, Wednesday, Friday Follow-up Appointments Wound #1 Left,Circumferential Lower Leg o Return Appointment in 1 week. Wound #2 Right,Circumferential Lower Leg o Return Appointment in 1 week. Brooke Hopkins, [id number] ([id number]) Edema Control Wound #1 Left,Circumferential Lower Leg o 2 Layer Lite Compression System - Bilateral - profore light o Elevate legs to the level of the heart and pump ankles as often as possible o Other: - use lymphedema pumps twice daily Wound #2 Right,Circumferential Lower Leg o 2 Layer Lite Compression System - Bilateral - profore light o Elevate legs to the level of the heart and pump ankles as often as possible o Other: - use lymphedema pumps twice daily Additional Orders / Instructions Wound #1 Left,Circumferential Lower Leg o Other: - get diabetes under control Wound #2 Right,Circumferential Lower Leg o Other: - get diabetes under control Home Health Wound #1 Left,Circumferential Lower Leg o [place] Nurse may visit PRN to address patientos wound care needs. o FACE TO FACE ENCOUNTER: MEDICARE and MEDICAID PATIENTS: I certify that this patient is under my care and that I had a face-to-face encounter that meets the physician face-to-face encounter requirements with this patient on this date. The encounter with the patient was in whole or in part for the following MEDICAL CONDITION: (primary reason for [place]) MEDICAL NECESSITY: I certify, that based on my findings, NURSING services are a medically necessary home health service. HOME BOUND STATUS: I certify that my clinical findings support that this patient is homebound (i.e., Due to illness or injury, pt requires aid of supportive devices such as crutches, cane, wheelchairs, walkers, the use of special transportation or the assistance of another person to leave their  place of residence. There is a normal inability to leave the home and doing so requires considerable and taxing effort. Other absences are for medical reasons / religious services and are infrequent or of short duration when for other reasons). o Please direct any NON-WOUND related issues/requests for orders to patient's Primary Care Physician Wound #2 Right,Circumferential Lower Leg o [place] Nurse may visit PRN to address patientos wound care needs. o FACE TO FACE ENCOUNTER: MEDICARE and MEDICAID PATIENTS: I certify that this patient is under my care and that I had a face-to-face encounter that meets the physician face-to-face encounter requirements with this patient on this date. The encounter with the patient was in whole or in part for the following MEDICAL CONDITION: (primary reason for [place]) MEDICAL NECESSITY: I certify, that based on my findings, NURSING services are a medically necessary home health service. HOME BOUND STATUS: I certify that my clinical findings support that this patient is homebound (i.e., Due to illness or injury, pt requires aid of RAINY, RUIZ. ([id number]) supportive devices such as crutches, cane, wheelchairs, walkers, the use of special transportation or the assistance of another person to leave their place of residence. There is a normal  inability to leave the home and doing so requires considerable and taxing effort. Other absences are for medical reasons / religious services and are infrequent or of short duration when for other reasons). o Please direct any NON-WOUND related issues/requests for orders to patient's Primary Care Physician Notes get vacsular report from [facility] Electronic Signature(s) Signed: 06/21/2016 4:06:39 PM By: Ardean Larsen Signed: 06/21/2016 4:34:27 PM By: Christin Fudge MD, FACS Entered By: Rebecca Eaton RN, Sendra on 06/21/2016 09:21:31 Brooke Hopkins, Brooke Hopkins  ([id number]) -------------------------------------------------------------------------------- Problem List Details Patient Name: Brooke Hopkins, Brooke Hopkins. Date of Service: 06/21/2016 8:00 AM Medical Record Number: [id number] Patient Account Number: [id number] Date of Birth/Sex: 1951-11-19 (64 y.o. 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 Date of Service: 06/21/2016 8:00 AM Medical Record Number: [id number] Patient Account Number: [id number] Date of Birth/Sex: 1951-05-30 (64 y.o. Female) Treating RN: Macarthur Critchley Primary Care Physician: FITZGERALD, DAVID Other Clinician: Referring Physician: FITZGERALD, DAVID Treating Physician/Extender: Frann Rider in Treatment: 0 Subjective Chief Complaint Information obtained from Patient Patients presents for treatment of an open diabetic ulcer and significant lymphedema which she's had for about 12 years History of Present Illness (HPI) The following HPI elements were documented for the patient's wound: Location:  massive swelling of both lower extremities and ulceration both lower extremities Quality: Patient reports experiencing a dull pain to affected area(s). Severity: Patient states wound are getting worse. Duration: Patient has had the wound for >12 years prior to seeking treatment at the wound center Timing: Pain in wound is constant (hurts all the time) Context: The wound appeared gradually over time Modifying Factors: Other treatment(s) tried include:she has a lymphedema pump but uses it seldom and she's had several course of antibiotics Associated Signs and Symptoms: Patient reports having difficulty standing for long periods. 65 year old patient seen by Dr. Ola Spurr of infectious disease who has been following up for left lower extremity cellulitis and ulcer with recurrent bilateral lower extremity problems for several months. Recently she had a large right lower extremity bullae which opened out and has been ulcerated. She has seen the vascular group and has been getting Unna's wraps and has a lymphedema pump used in the past. Her prior cultures were positive for Pseudomonas, Proteus and was treated with amoxicillin. He has also been treated with 2 weeks course of ciprofloxacin and amoxicillin.. Increase of Lasix dose helped with the edema and echo showed no systolic CHF but may be diastolic problems. Past medical history significant for diabetes mellitus type 2, venous stasis ulcer, obesity, diabetic peripheral neuropathy, status post back surgery, cholecystectomy, hysterectomy, arthroscopy of the knee. He is a former smoker and quit smoking in 1984. The patient has seen Dr. Delana Meyer who did not recommend any arterial or venous duplex studies and has been using Unna's wraps and also recommended a lymphedema pump. she has been very noncompliant with  using these. Patient History Allergies no know allergies WHITTANY, AKRIDGE ([id number]) Family History Cancer, Diabetes - Siblings,,  Hypertension, Kidney Disease, Stroke, No family history of Heart Disease, Hereditary Spherocytosis, Lung Disease, Seizures, Thyroid Problems, Tuberculosis. Social History Former smoker - quit 25 yrs ago, Marital Status - Married, Alcohol Use - Never, Drug Use - No History, Caffeine Use - Daily. Review of Systems (ROS) Constitutional Symptoms (General Health) The patient has no complaints or symptoms. Eyes The patient has no complaints or symptoms. Ear/Nose/Mouth/Throat The patient has no complaints or symptoms. Hematologic/Lymphatic The patient has no complaints or symptoms. Respiratory The patient has no complaints or symptoms. Cardiovascular Complains or has symptoms of LE edema. Denies complaints or symptoms of Chest pain. Gastrointestinal The patient has no complaints or symptoms. Endocrine The patient has no complaints or symptoms. Genitourinary The patient has no complaints or symptoms. Immunological The patient has no complaints or symptoms. Integumentary (Skin) Complains or has symptoms of Wounds. Musculoskeletal The patient has no complaints or symptoms. Neurologic The patient has no complaints or symptoms. Oncologic The patient has no complaints or symptoms. Psychiatric The patient has no complaints or symptoms. Objective Constitutional NAIRA, TEJADA. ([id number]) Pulse regular. Respirations normal and unlabored. Afebrile. Vitals Time Taken: 8:39 AM, Height: 67 in, Source: Stated, Weight: 300 lbs, Source: Stated, BMI: 47, Temperature: 98.0 F, Pulse: 79 bpm, Blood Pressure: 198/75 mmHg. Eyes Nonicteric. Reactive to light. Ears, Nose, Mouth, and Throat Lips, teeth, and gums WNL.Marland Kitchen Moist mucosa without lesions. Neck supple and nontender. No palpable supraclavicular or cervical adenopathy. Normal sized without goiter. Respiratory WNL. No retractions.. Cardiovascular PT pulses are not palpable and ABI could not be measured. she has massive stage II  lymphedema both lower extremities. Gastrointestinal (GI) Abdomen without masses or tenderness.. No liver or spleen enlargement or tenderness.. Lymphatic No adneopathy. No adenopathy. No adenopathy. Musculoskeletal Adexa without tenderness or enlargement.. Digits and nails w/o clubbing, cyanosis, infection, petechiae, ischemia, or inflammatory conditions.Marland Kitchen Psychiatric Judgement and insight Intact.. No evidence of depression, anxiety, or agitation.. General Notes: the patient has massive stage II lymphedema which is fibrotic and has dermal thickening and was a posterior and of both lower extremities she has got large ulcerations which have significant amount of slough and with a #3 curetted I sharply removed as much as I could under local anesthesia with lidocaine gel. Minimal bleeding controlled with pressure Integumentary (Hair, Skin) No suspicious lesions. No crepitus or fluctuance. No peri-wound warmth or erythema. No masses.. Wound #1 status is Open. Original cause of wound was Gradually Appeared. The wound is located on the Left,Circumferential Lower Leg. The wound measures 8cm length x 16cm width x 0.2cm depth; 100.531cm^2 area and 20.106cm^3 volume. The wound is limited to skin breakdown. There is no tunneling or undermining noted. There is a large amount of serous drainage noted. The wound margin is flat and intact. There is medium (34-66%) granulation within the wound bed. There is a small (1-33%) amount of necrotic tissue within the wound bed including Adherent Slough. The periwound skin appearance did not exhibit: Callus, Crepitus, Excoriation, Fluctuance, Friable, Induration, Localized Edema, Rash, Scarring, Mondesir, Breiona M. ([id number]) Dry/Scaly, Maceration, Moist, Atrophie Blanche, Cyanosis, Ecchymosis, Hemosiderin Staining, Mottled, Pallor, Rubor, Erythema. Wound #2 status is Open. Original cause of wound was Gradually Appeared. The wound is located on  the Right,Circumferential Lower Leg. The wound measures 16cm length x 18cm width x 0.2cm depth; 226.195cm^2 area and 45.239cm^3 volume. The wound is limited to skin breakdown. There is no tunneling or  undermining noted. There is a large amount of serous drainage noted. The wound margin is flat and intact. There is large (67-100%) pink granulation within the wound bed. There is a small (1-33%) amount of necrotic tissue within the wound bed including Adherent Slough. The periwound skin appearance exhibited: Maceration, Moist. The periwound skin appearance did not exhibit: Callus, Crepitus, Excoriation, Fluctuance, Friable, Induration, Localized Edema, Rash, Scarring, Dry/Scaly, Atrophie Blanche, Cyanosis, Ecchymosis, Hemosiderin Staining, Mottled, Pallor, Rubor, Erythema. The periwound has tenderness on palpation. 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 She has been seen by Dr. Ola Spurr from infectious disease and has been treated with various antibiotics over the years and a pending culture report is awaited. He has also had a vascular consultation with Dr. Delana Meyer who has recommended lymph- pumps and Unna's boots. after thorough review I recommended: 1. Silver alginate to the wounds and Profore lite to be applied to both lower extremities and change 3 times a week 2. Elevation and exercise 3. Good control of her diabetes mellitus. 4. Regular visits to the wound center. 5. using a lymphedema pumps for an hour each twice a day Brooke Hopkins, [place].  ([id number]) I have spent some time discussing with the patient and her husband the importance of compliance and the fact that she has all the equipment but has not been using it appropriately. They both understand and say they will be compliant. Procedures Wound #1 Wound #1 is a Lymphedema located on the Left,Circumferential Lower Leg . There was a Skin/Subcutaneous Tissue Debridement [id number]) debridement with total area of 20 sq cm performed by Christin Fudge, MD. with the following instrument(s): Curette including Fibrin/Slough and Subcutaneous after achieving pain control using Lidocaine 4% Topical Solution. A time out was conducted prior to the start of the procedure. A Minimum amount of bleeding was controlled with N/A. The procedure was tolerated well with a pain level of 4 throughout and a pain level of 4 following the procedure. Post Debridement Measurements: 8cm length x 16cm width x 0.2cm depth; 20.106cm^3 volume. Post procedure Diagnosis Wound #1: Same as Pre-Procedure Wound #2 Wound #2 is a Lymphedema located on the Right,Circumferential Lower Leg . There was a Skin/Subcutaneous Tissue Debridement [id number]) debridement with total area of 6 sq cm performed by Christin Fudge, MD. with the following instrument(s): Curette to remove Viable and Non-Viable tissue/material including Fibrin/Slough and Subcutaneous after achieving pain control using Lidocaine 4% Topical Solution. A time out was conducted prior to the start of the procedure. A Minimum amount of bleeding was controlled with N/A. The procedure was tolerated well with a pain level of 4 throughout and a pain level of 4 following the procedure. Post Debridement Measurements: 16cm length x 18cm width x 0.2cm depth; 45.239cm^3 volume. Post procedure Diagnosis Wound #2: Same as Pre-Procedure Plan Wound Cleansing: Wound #1 Left,Circumferential Lower Leg: May shower with protection. Wound #2 Right,Circumferential Lower  Leg: May shower with protection. Primary Wound Dressing: Wound #1 Left,Circumferential Lower Leg: Aquacel Ag Wound #2 Right,Circumferential Lower Leg: Aquacel Ag Secondary Dressing: Wound #1 Left,Circumferential Lower Leg: ABD pad Dry Gauze NASSIM, KLEPPER. ([id number]) Wound #  2 Right,Circumferential Lower Leg: ABD pad Dry Gauze Dressing Change Frequency: Wound #1 Left,Circumferential Lower Leg: Change Dressing Monday, Wednesday, Friday Wound #2 Right,Circumferential Lower Leg: Change Dressing Monday, Wednesday, Friday Follow-up Appointments: Wound #1 Left,Circumferential Lower Leg: Return Appointment in 1 week. Wound #2 Right,Circumferential Lower Leg: Return Appointment in 1 week. Edema Control: Wound #1 Left,Circumferential Lower Leg: 2 Layer Lite Compression System - Bilateral - profore light Elevate legs to the level of the heart and pump ankles as often as possible Other: - use lymphedema pumps twice daily Wound #2 Right,Circumferential Lower Leg: 2 Layer Lite Compression System - Bilateral - profore light Elevate legs to the level of the heart and pump ankles as often as possible Other: - use lymphedema pumps twice daily Additional Orders / Instructions: Wound #1 Left,Circumferential Lower Leg: Other: - get diabetes under control Wound #2 Right,Circumferential Lower Leg: Other: - get diabetes under control Home Health: Wound #1 Left,Circumferential Lower Leg: [place] Nurse may visit PRN to address patient s wound care needs. FACE TO FACE ENCOUNTER: MEDICARE and MEDICAID PATIENTS: I certify that this patient is under my care and that I had a face-to-face encounter that meets the physician face-to-face encounter requirements with this patient on this date. The encounter with the patient was in whole or in part for the following MEDICAL CONDITION: (primary reason for [place]) MEDICAL NECESSITY: I certify, that based on my  findings, NURSING services are a medically necessary home health service. HOME BOUND STATUS: I certify that my clinical findings support that this patient is homebound (i.e., Due to illness or injury, pt requires aid of supportive devices such as crutches, cane, wheelchairs, walkers, the use of special transportation or the assistance of another person to leave their place of residence. There is a normal inability to leave the home and doing so requires considerable and taxing effort. Other absences are for medical reasons / religious services and are infrequent or of short duration when for other reasons). Please direct any NON-WOUND related issues/requests for orders to patient's Primary Care Physician Wound #2 Right,Circumferential Lower Leg: [place] Nurse may visit PRN to address patient s wound care needs. FACE TO FACE ENCOUNTER: MEDICARE and MEDICAID PATIENTS: I certify that this patient is under my care and that I had a face-to-face encounter that meets the physician face-to-face encounter requirements with this patient on this date. The encounter with the patient was in whole or in part for the following MEDICAL CONDITION: (primary reason for [place]) MEDICAL NECESSITY: I certify, that based on my findings, NURSING services are a medically necessary home health service. HOME BOUND STATUS: I certify that my clinical findings support that this patient is homebound (i.e., Due to SUCCESS, DELLES. ([id number]) illness or injury, pt requires aid of supportive devices such as crutches, cane, wheelchairs, walkers, the use of special transportation or the assistance of another person to leave their place of residence. There is a normal inability to leave the home and doing so requires considerable and taxing effort. Other absences are for medical reasons / religious services and are infrequent or of short duration when for other reasons). Please direct  any NON-WOUND related issues/requests for orders to patient's Primary Care Physician General Notes: get vacsular report from [facility] 65 year old diabetic patient with noncompliance both for control of glucose and using lymphedema pumps has had these problems for over 12 years. She has been seen by Dr. Ola Spurr from infectious disease and has  been treated with various antibiotics over the years and a pending culture report is awaited. He has also had a vascular consultation with Dr. Delana Meyer who has recommended lymph- pumps and Unna's boots. after thorough review I recommended: 1. Silver alginate to the wounds and Profore lite to be applied to both lower extremities and change 3 times a week 2. Elevation and exercise 3. Good control of her diabetes mellitus. 4. Regular visits to the wound center. 5. using a lymphedema pumps for an hour each twice a day I have spent some time discussing with the patient and her husband the importance of compliance and the fact that she has all the equipment but has not been using it appropriately. They both understand and say they will be compliant. Electronic Signature(s) Signed: 06/21/2016 4:36:12 PM By: Christin Fudge MD, FACS Previous Signature: 06/21/2016 9:43:24 AM Version By: Christin Fudge MD, FACS Previous Signature: 06/21/2016 9:33:34 AM Version By: Christin Fudge MD, FACS Entered By: Christin Fudge on 06/21/2016 16:36:12 IRHAA, MAGNIN ([id number]) -------------------------------------------------------------------------------- ROS/PFSH Details Patient Name: MACARENA, BOUSE. Date of Service: 06/21/2016 8:00 AM Medical Record Number: [id number] Patient Account Number: [id number] Date of Birth/Sex: 1951/03/14 (64 y.o. Female) Treating RN: Macarthur Critchley Primary Care Physician: FITZGERALD, DAVID Other Clinician: Referring Physician: FITZGERALD, DAVID Treating Physician/Extender: Frann Rider in Treatment: 0 Wound  History Cardiovascular Complaints and Symptoms: Positive for: LE edema Negative for: Chest pain Integumentary (Skin) Complaints and Symptoms: Positive for: Wounds Constitutional Symptoms (General Health) Complaints and Symptoms: No Complaints or Symptoms Eyes Complaints and Symptoms: No Complaints or Symptoms Ear/Nose/Mouth/Throat Complaints and Symptoms: No Complaints or Symptoms Hematologic/Lymphatic Complaints and Symptoms: No Complaints or Symptoms Respiratory Complaints and Symptoms: No Complaints or Symptoms Gastrointestinal Complaints and Symptoms: No Complaints or Symptoms Endocrine TYTIANNA, GRINER ([id number]) Complaints and Symptoms: No Complaints or Symptoms Genitourinary Complaints and Symptoms: No Complaints or Symptoms Immunological Complaints and Symptoms: No Complaints or Symptoms Musculoskeletal Complaints and Symptoms: No Complaints or Symptoms Neurologic Complaints and Symptoms: No Complaints or Symptoms Oncologic Complaints and Symptoms: No Complaints or Symptoms Psychiatric Complaints and Symptoms: No Complaints or Symptoms Family and Social History Cancer: Yes; Diabetes: Yes - Siblings,; Heart Disease: No; Hereditary Spherocytosis: No; Hypertension: Yes; Kidney Disease: Yes; Lung Disease: No; Seizures: No; Stroke: Yes; Thyroid Problems: No; Tuberculosis: No; Former smoker - quit 25 yrs ago; Marital Status - Married; Alcohol Use: Never; Drug Use: No History; Caffeine Use: Daily; Financial Concerns: No; Food, Clothing or Shelter Needs: No; Support System Lacking: No; Transportation Concerns: No; Advanced Directives: No; Patient does not want information on Advanced Directives; Do not resuscitate: No; Living Will: No; Medical Power of Attorney: No Physician Affirmation I have reviewed and agree with the above information. Electronic Signature(s) Signed: 06/21/2016 9:43:05 AM By: Christin Fudge MD, FACS Signed: 06/21/2016 4:06:39 PM By:  Rebecca Eaton RN, [place] Entered By: Christin Fudge on 06/21/2016 09:43:05 SIMONE, HINDI ([id number]) -------------------------------------------------------------------------------- SuperBill Details Patient Name: KIMBERLEIGH, PLOSKI. Date of Service: 06/21/2016 Medical Record Number: [id number] Patient Account Number: [id number] Date of Birth/Sex: Aug 02, 1951 (66 y.o. 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 E66.01 Morbid (severe) obesity due to excess calories Facility Procedures CPT4 Code:  [id number] Description: 99213 - WOUND CARE VISIT-LEV 3 EST PT Modifier: Quantity: 1 CPT4 Code: [id number] Description: [id number] - DEB SUBQ TISSUE 20 SQ CM/< ICD-10 Description Diagnosis E11.622 Type 2 diabetes mellitus with other skin ulcer I89.0 Lymphedema, not elsewhere classified L97.222 Non-pressure chronic ulcer of left calf with fat l 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 Modifier: ayer exposed layer exposed Quantity: 1 CPT4 Code: [id number] Description: [id number] - DEB SUBQ TISS EA ADDL 20CM ICD-10 Description Diagnosis E11.622 Type 2 diabetes mellitus with other skin ulcer I89.0 Lymphedema, not elsewhere classified L97.222 Non-pressure chronic ulcer of left calf with fat l 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 Modifier: ayer exposed layer exposed Quantity: 1 Physician Procedures CPT4 Code: [id number] [place], [place] Description: 99204 - WC PHYS LEVEL 4 - NEW PT ICD-10 Description Diagnosis E11.622 Type 2 diabetes mellitus  with other skin ulcer NDA M. ([id number]) Modifier: 25 Quantity: 1 Electronic Signature(s) Signed: 06/21/2016 4:06:39 PM By: Ardean Larsen Signed: 06/21/2016 4:34:27 PM By: Christin Fudge MD, FACS Previous Signature: 06/21/2016 9:34:01 AM Version By: Christin Fudge MD, FACS Entered By: Rebecca Eaton RN, Roslynn Amble on 06/21/2016 10:28:56

## 2016-06-21 NOTE — Progress Notes (Signed)
KALLIA, SORTO ([id number]) Visit Report for 06/21/2016 Allergy List Details Patient Name: Brooke Hopkins, Brooke Hopkins. Date of Service: 06/21/2016 8:00 AM Medical Record Number: [id number] Patient Account Number: [id number] Date of Birth/Sex: May 11, 1951 (64 y.o. Female) Treating RN: Macarthur Critchley Primary Care Physician: FITZGERALD, DAVID Other Clinician: Referring Physician: FITZGERALD, DAVID Treating Physician/Extender: Frann Rider in Treatment: 0 Allergies Active Allergies no know allergies Allergy Notes Electronic Signature(s) Signed: 06/21/2016 4:06:39 PM By: Rebecca Eaton RN, Sendra Entered By: Rebecca Eaton RN, Sendra on 06/21/2016 10:25:47 Brooke Hopkins ([id number]) -------------------------------------------------------------------------------- Arrival Information Details Patient Name: Brooke Hopkins, Brooke Hopkins. Date of Service: 06/21/2016 8:00 AM Medical Record Number: [id number] Patient Account Number: [id number] Date of Birth/Sex: Jul 17, 1951 (64 y.o. Female) Treating RN: Macarthur Critchley Primary Care Physician: FITZGERALD, DAVID Other Clinician: Referring Physician: FITZGERALD, DAVID Treating Physician/Extender: Frann Rider in Treatment: 0 Visit Information Patient Arrived: Wheel Chair Arrival Time: 08:35 Transfer Assistance: None Patient Identification Verified: Yes Secondary Verification Process Yes Completed: Electronic Signature(s) Signed: 06/21/2016 4:06:39 PM By: Rebecca Eaton RN, Sendra Entered By: Rebecca Eaton RN, Sendra on 06/21/2016 08:35:44 Brooke Hopkins ([id number]) -------------------------------------------------------------------------------- Clinic Level of Care Assessment Details Patient Name: Brooke Hopkins, Brooke Hopkins. Date of Service: 06/21/2016 8:00 AM Medical Record Number: [id number] Patient Account Number: [id number] Date of Birth/Sex: January 02, 1951 (64 y.o. Female) Treating RN: Macarthur Critchley Primary Care Physician: FITZGERALD, DAVID Other  Clinician: Referring Physician: FITZGERALD, DAVID Treating Physician/Extender: Frann Rider in Treatment: 0 Clinic Level of Care Assessment Items TOOL 1 Quantity Score X - Use when EandM and Procedure is performed on INITIAL visit 1 0 ASSESSMENTS - Nursing Assessment / Reassessment X - General Physical Exam (combine w/ comprehensive assessment (listed just 1 20 below) when performed on new pt. evals) X - Comprehensive Assessment (HX, ROS, Risk Assessments, Wounds Hx, etc.) 1 25 ASSESSMENTS - Wound and Skin Assessment / Reassessment []  - Dermatologic / Skin Assessment (not related to wound area) 0 ASSESSMENTS - Ostomy and/or Continence Assessment and Care []  - Incontinence Assessment and Management 0 []  - Ostomy Care Assessment and Management (repouching, etc.) 0 PROCESS - Coordination of Care X - Simple Patient / Family Education for ongoing care 1 15 []  - Complex (extensive) Patient / Family Education for ongoing care 0 X - Staff obtains Programmer, systems, Records, Test Results / Process Orders 1 10 X - Staff telephones HHA, Nursing Homes / Clarify orders / etc 1 10 []  - Routine Transfer to another Facility (non-emergent condition) 0 []  - Routine Hospital Admission (non-emergent condition) 0 []  - New Admissions / Biomedical engineer / Ordering NPWT, Apligraf, etc. 0 []  - Emergency Hospital Admission (emergent condition) 0 PROCESS - Special Needs []  - Pediatric / Minor Patient Management 0 []  - Isolation Patient Management 0 Brooke Hopkins, Brooke Hopkins. ([id number]) []  - Hearing / Language / Visual special needs 0 []  - Assessment of Community assistance (transportation, D/C planning, etc.) 0 []  - Additional assistance / Altered mentation 0 []  - Support Surface(s) Assessment (bed, cushion, seat, etc.) 0 INTERVENTIONS - Miscellaneous []  - External ear exam 0 []  - Patient Transfer (multiple staff / Civil Service fast streamer / Similar devices) 0 []  - Simple Staple / Suture removal (25 or less) 0 []  -  Complex Staple / Suture removal (26 or more) 0 []  - Hypo/Hyperglycemic Management (do not check if billed separately) 0 []  - Ankle / Brachial Index (ABI) - do not check if billed separately 0 Has the patient been seen at the hospital within the last three years: Yes Total Score: 80 Level Of Care: New/Established -  Level 3 Electronic Signature(s) Signed: 06/21/2016 4:06:39 PM By: Rebecca Eaton RN, Sendra Entered By: Rebecca Eaton RN, Sendra on 06/21/2016 10:28:47 Brooke Hopkins ([id number]) -------------------------------------------------------------------------------- Encounter Discharge Information Details Patient Name: Brooke Hopkins, Brooke Hopkins. Date of Service: 06/21/2016 8:00 AM Medical Record Number: [id number] Patient Account Number: [id number] Date of Birth/Sex: 1951/05/02 (64 y.o. Female) Treating RN: Macarthur Critchley Primary Care Physician: FITZGERALD, DAVID Other Clinician: Referring Physician: FITZGERALD, DAVID Treating Physician/Extender: Frann Rider in Treatment: 0 Encounter Discharge Information Items Facility Notification Discharge Pain Level: 4 Facility Type: Home Health Discharge Condition: Stable Orders Sent: Yes Ambulatory Status: Wheelchair Discharge Destination: Home Transportation: Private Auto Accompanied By: husband Schedule Follow-up Appointment: Yes Medication Reconciliation completed and provided to Patient/Care Yes Travelle Mcclimans: Provided on Clinical Summary of Care: 06/21/2016 Form Type Recipient Paper Patient LB Electronic Signature(s) Signed: 06/21/2016 4:06:39 PM By: Rebecca Eaton RN, Sendra Previous Signature: 06/21/2016 9:37:10 AM Version By: Ruthine Dose Entered By: Rebecca Eaton RN, Sendra on 06/21/2016 10:26:57 Brooke Hopkins ([id number]) -------------------------------------------------------------------------------- Lower Extremity Assessment Details Patient Name: Brooke Hopkins, Brooke Hopkins. Date of Service: 06/21/2016 8:00 AM Medical Record Number:  [id number] Patient Account Number: [id number] Date of Birth/Sex: 1951-10-25 (64 y.o. Female) Treating RN: Macarthur Critchley Primary Care Physician: FITZGERALD, DAVID Other Clinician: Referring Physician: FITZGERALD, DAVID Treating Physician/Extender: Frann Rider in Treatment: 0 Edema Assessment Assessed: [Left: No] [Right: No] Edema: [Left: Yes] [Right: Yes] Calf Left: Right: Point of Measurement: 32 cm From Medial Instep 50.5 cm 57.5 cm Ankle Left: Right: Point of Measurement: 11 cm From Medial Instep 33 cm 31.5 cm Electronic Signature(s) Signed: 06/21/2016 4:06:39 PM By: Rebecca Eaton, RN, Sendra Entered By: Rebecca Eaton RN, Sendra on 06/21/2016 08:55:29 Brooke Hopkins ([id number]) -------------------------------------------------------------------------------- Multi Wound Chart Details Patient Name: Brooke Hopkins. Date of Service: 06/21/2016 8:00 AM Medical Record Number: [id number] Patient Account Number: [id number] Date of Birth/Sex: 03/23/1951 (64 y.o. Female) Treating RN: Macarthur Critchley Primary Care Physician: FITZGERALD, DAVID Other Clinician: Referring Physician: FITZGERALD, DAVID Treating Physician/Extender: Frann Rider in Treatment: 0 Vital Signs Height(in): 67 Pulse(bpm): 79 Weight(lbs): 300 Blood Pressure 198/75 (mmHg): Body Mass Index(BMI): 47 Temperature(F): 98.0 Respiratory Rate (breaths/min): Photos: [1:No Photos] [2:No Photos] [N/A:N/A] Wound Location: [1:Left Lower Leg - Circumfernential] [2:Right Lower Leg - Circumfernential] [N/A:N/A] Wounding Event: [1:Gradually Appeared] [2:Gradually Appeared] [N/A:N/A] Primary Etiology: [1:Lymphedema] [2:Lymphedema] [N/A:N/A] Date Acquired: [1:12/31/2015] [2:12/31/2015] [N/A:N/A] Weeks of Treatment: [1:0] [2:0] [N/A:N/A] Wound Status: [1:Open] [2:Open] [N/A:N/A] Clustered Wound: [1:No] [2:Yes] [N/A:N/A] Measurements L x W x D 8x16x0.2 [2:16x18x0.2] [N/A:N/A] (cm) Area (cm) : [1:100.531]  [id number] [N/A:N/A] Volume (cm) : [1:20.106] [2:45.239] [N/A:N/A] % Reduction in Area: [1:0.00%] [2:N/A] [N/A:N/A] % Reduction in Volume: 0.00% [2:N/A] [N/A:N/A] Classification: [1:Partial Thickness] [2:Partial Thickness] [N/A:N/A] Exudate Amount: [1:Large] [2:Large] [N/A:N/A] Exudate Type: [1:Serous] [2:Serous] [N/A:N/A] Exudate Color: [1:amber] [2:amber] [N/A:N/A] Wound Margin: [1:Flat and Intact] [2:Flat and Intact] [N/A:N/A] Granulation Amount: [1:Medium (34-66%)] [2:Large (67-100%)] [N/A:N/A] Granulation Quality: [1:N/A] [2:Pink] [N/A:N/A] Necrotic Amount: [1:Small (1-33%)] [2:Small (1-33%)] [N/A:N/A] Exposed Structures: [1:Fascia: No Fat: No Tendon: No Muscle: No Joint: No Bone: No] [2:Fascia: No Fat: No Tendon: No Muscle: No Joint: No Bone: No] [N/A:N/A] Limited to Skin Limited to Skin Breakdown Breakdown Epithelialization: None None N/A Periwound Skin Texture: Edema: No Edema: No N/A Excoriation: No Excoriation: No Induration: No Induration: No Callus: No Callus: No Crepitus: No Crepitus: No Fluctuance: No Fluctuance: No Friable: No Friable: No Rash: No Rash: No Scarring: No Scarring: No Periwound Skin Maceration: No Maceration: Yes N/A Moisture: Moist: No Moist: Yes Dry/Scaly: No Dry/Scaly: No Periwound Skin Color: Atrophie Blanche: No  Atrophie Blanche: No N/A Cyanosis: No Cyanosis: No Ecchymosis: No Ecchymosis: No Erythema: No Erythema: No Hemosiderin Staining: No Hemosiderin Staining: No Mottled: No Mottled: No Pallor: No Pallor: No Rubor: No Rubor: No Tenderness on No Yes N/A Palpation: Wound Preparation: Ulcer Cleansing: Other: Ulcer Cleansing: Other: N/A soap and water soap and water Topical Anesthetic Topical Anesthetic Applied: Other: lidocaine Applied: Other: lidocaine 4% 4% Treatment Notes Electronic Signature(s) Signed: 06/21/2016 4:06:39 PM By: Rebecca Eaton, RN, Sendra Entered By: Rebecca Eaton RN, Sendra on 06/21/2016  09:11:46 Brooke Hopkins ([id number]) -------------------------------------------------------------------------------- Multi-Disciplinary Care Plan Details Patient Name: Brooke Hopkins, Brooke Hopkins. Date of Service: 06/21/2016 8:00 AM Medical Record Number: [id number] Patient Account Number: [id number] Date of Birth/Sex: [birth year] (64 y.o. Female) Treating RN: Macarthur Critchley Primary Care Physician: FITZGERALD, DAVID Other Clinician: Referring Physician: FITZGERALD, DAVID Treating Physician/Extender: Frann Rider in Treatment: 0 Active Inactive Orientation to the Wound Care Program Nursing Diagnoses: Knowledge deficit related to the wound healing center program Goals: Patient/caregiver will verbalize understanding of the [place] Date Initiated: 06/21/2016 Goal Status: Active Interventions: Provide education on orientation to the wound center Notes: Wound/Skin Impairment Nursing Diagnoses: Impaired tissue integrity Goals: Patient/caregiver will verbalize understanding of skin care regimen Date Initiated: 06/21/2016 Goal Status: Active Ulcer/skin breakdown will have a volume reduction of 30% by week 4 Date Initiated: 06/21/2016 Goal Status: Active Ulcer/skin breakdown will have a volume reduction of 50% by week 8 Date Initiated: 06/21/2016 Goal Status: Active Ulcer/skin breakdown will have a volume reduction of 80% by week 12 Date Initiated: 06/21/2016 Goal Status: Active Ulcer/skin breakdown will heal within 14 weeks Date Initiated: 06/21/2016 Goal Status: Active JAMIRRAH, LORAH ([id number]) Interventions: Assess patient/caregiver ability to obtain necessary supplies Assess patient/caregiver ability to perform ulcer/skin care regimen upon admission and as needed Assess ulceration(s) every visit Provide education on ulcer and skin care Notes: Electronic Signature(s) Signed: 06/21/2016 4:06:39 PM By: Rebecca Eaton, RN, Sendra Entered By: Rebecca Eaton RN,  Sendra on 06/21/2016 09:11:34 Brooke Hopkins ([id number]) -------------------------------------------------------------------------------- Pain Assessment Details Patient Name: Brooke Hopkins, Brooke Hopkins. Date of Service: 06/21/2016 8:00 AM Medical Record Number: [id number] Patient Account Number: [id number] Date of Birth/Sex: Apr 28, 1951 (64 y.o. Female) Treating RN: Macarthur Critchley Primary Care Physician: FITZGERALD, DAVID Other Clinician: Referring Physician: FITZGERALD, DAVID Treating Physician/Extender: Frann Rider in Treatment: 0 Active Problems Location of Pain Severity and Description of Pain Patient Has Paino Yes Site Locations Pain Location: Generalized Pain, Pain in Ulcers Rate the pain. Current Pain Level: 4 Pain Management and Medication Current Pain Management: Electronic Signature(s) Signed: 06/21/2016 4:06:39 PM By: Rebecca Eaton RN, Sendra Entered By: Rebecca Eaton RN, Sendra on 06/21/2016 08:36:37 Brooke Hopkins ([id number]) -------------------------------------------------------------------------------- Patient/Caregiver Education Details Patient Name: Brooke Hopkins, Brooke Hopkins. Date of Service: 06/21/2016 8:00 AM Medical Record Number: [id number] Patient Account Number: [id number] Date of Birth/Gender: 09-23-1951 (64 y.o. Female) Treating RN: Macarthur Critchley Primary Care Physician: FITZGERALD, DAVID Other Clinician: Referring Physician: FITZGERALD, DAVID Treating Physician/Extender: Frann Rider in Treatment: 0 Education Assessment Education Provided To: Patient and Caregiver Education Topics Provided Venous: Handouts: Other: lymphedema pump Methods: Explain/Verbal Responses: State content correctly Welcome To [facility]: Handouts: Welcome To [facility] Methods: Explain/Verbal Responses: State content correctly Wound/Skin Impairment: Handouts: Caring for Your Ulcer Methods: Explain/Verbal Responses: State content  correctly Electronic Signature(s) Signed: 06/21/2016 4:06:39 PM By: Rebecca Eaton, RN, Sendra Entered By: Rebecca Eaton RN, Sendra on 06/21/2016 10:27:31 Brooke Hopkins ([id number]) -------------------------------------------------------------------------------- Wound Assessment Details Patient Name: Brooke Hopkins, Brooke Hopkins. Date of Service: 06/21/2016 8:00 AM Medical Record  Number: [id number] Patient Account Number: [id number] Date of Birth/Sex: 18-Dec-1951 (64 y.o. Female) Treating RN: Macarthur Critchley Primary Care Physician: FITZGERALD, DAVID Other Clinician: Referring Physician: FITZGERALD, DAVID Treating Physician/Extender: Frann Rider in Treatment: 0 Wound Status Wound Number: 1 Primary Etiology: Lymphedema Wound Location: Left Lower Leg - Wound Status: Open Circumfernential Wounding Event: Gradually Appeared Date Acquired: 12/31/2015 Weeks Of Treatment: 0 Clustered Wound: No Photos Wound Measurements Length: (cm) 8 Width: (cm) 16 Depth: (cm) 0.2 Area: (cm) 100.531 Volume: (cm) 20.106 % Reduction in Area: 0% % Reduction in Volume: 0% Epithelialization: None Tunneling: No Undermining: No Wound Description Classification: Partial Thickness Wound Margin: Flat and Intact Exudate Amount: Large Exudate Type: Serous Exudate Color: amber Foul Odor After Cleansing: No Wound Bed Granulation Amount: Medium (34-66%) Exposed Structure Necrotic Amount: Small (1-33%) Fascia Exposed: No Necrotic Quality: Adherent Slough Fat Layer Exposed: No Tendon Exposed: No Brooke Hopkins, VANDENBOSSCHE ([id number]) Muscle Exposed: No Joint Exposed: No Bone Exposed: No Limited to Skin Breakdown Periwound Skin Texture Texture Color No Abnormalities Noted: No No Abnormalities Noted: No Callus: No Atrophie Blanche: No Crepitus: No Cyanosis: No Excoriation: No Ecchymosis: No Fluctuance: No Erythema: No Friable: No Hemosiderin Staining: No Induration: No Mottled: No Localized Edema:  No Pallor: No Rash: No Rubor: No Scarring: No Moisture No Abnormalities Noted: No Dry / Scaly: No Maceration: No Moist: No Wound Preparation Ulcer Cleansing: Other: soap and water, Topical Anesthetic Applied: Other: lidocaine 4%, Treatment Notes Wound #1 (Left, Circumferential Lower Leg) 1. Cleansed with: Cleanse wound with antibacterial soap and water 4. Dressing Applied: Aquacel Ag 5. Secondary Dressing Applied ABD Pad Dry Gauze 7. Secured with Other (specify in notes) Notes profore lite Electronic Signature(s) Signed: 06/21/2016 4:06:39 PM By: Rebecca Eaton, RN, Sendra Entered By: Rebecca Eaton RN, Sendra on 06/21/2016 15:58:32 Brooke Hopkins ([id number]) -------------------------------------------------------------------------------- Wound Assessment Details Patient Name: Brooke Hopkins, Brooke Hopkins. Date of Service: 06/21/2016 8:00 AM Medical Record Number: [id number] Patient Account Number: [id number] Date of Birth/Sex: 1951/02/10 (64 y.o. Female) Treating RN: Macarthur Critchley Primary Care Physician: FITZGERALD, DAVID Other Clinician: Referring Physician: FITZGERALD, DAVID Treating Physician/Extender: Frann Rider in Treatment: 0 Wound Status Wound Number: 2 Primary Etiology: Lymphedema Wound Location: Right Lower Leg - Wound Status: Open Circumfernential Wounding Event: Gradually Appeared Date Acquired: 12/31/2015 Weeks Of Treatment: 0 Clustered Wound: Yes Photos Wound Measurements Length: (cm) 16 Width: (cm) 18 Depth: (cm) 0.2 Area: (cm) 226.195 Volume: (cm) 45.239 % Reduction in Area: 0% % Reduction in Volume: 0% Epithelialization: None Tunneling: No Undermining: No Wound Description Classification: Partial Thickness Wound Margin: Flat and Intact Exudate Amount: Large Exudate Type: Serous Exudate Color: amber Foul Odor After Cleansing: No Wound Bed Granulation Amount: Large (67-100%) Exposed Structure Granulation Quality: Pink Fascia  Exposed: No Necrotic Amount: Small (1-33%) Fat Layer Exposed: No Necrotic Quality: Adherent Slough Tendon Exposed: No HILARIE, HOILAND ([id number]) Muscle Exposed: No Joint Exposed: No Bone Exposed: No Limited to Skin Breakdown Periwound Skin Texture Texture Color No Abnormalities Noted: No No Abnormalities Noted: No Callus: No Atrophie Blanche: No Crepitus: No Cyanosis: No Excoriation: No Ecchymosis: No Fluctuance: No Erythema: No Friable: No Hemosiderin Staining: No Induration: No Mottled: No Localized Edema: No Pallor: No Rash: No Rubor: No Scarring: No Temperature / Pain Moisture Tenderness on Palpation: Yes No Abnormalities Noted: No Dry / Scaly: No Maceration: Yes Moist: Yes Wound Preparation Ulcer Cleansing: Other: soap and water, Topical Anesthetic Applied: Other: lidocaine 4%, Treatment Notes Wound #2 (Right, Circumferential Lower Leg) 1. Cleansed with: Cleanse wound with antibacterial soap  and water 4. Dressing Applied: Aquacel Ag 5. Secondary Dressing Applied ABD Pad Dry Gauze 7. Secured with Other (specify in notes) Notes profore lite Electronic Signature(s) Signed: 06/21/2016 4:06:39 PM By: Rebecca Eaton, RN, Sendra Entered By: Rebecca Eaton RN, Sendra on 06/21/2016 15:59:09 Brooke Hopkins ([id number]) -------------------------------------------------------------------------------- Vitals Details Patient Name: KIMBERLEE, SICKING. Date of Service: 06/21/2016 8:00 AM Medical Record Number: [id number] Patient Account Number: [id number] Date of Birth/Sex: 09-07-1951 (64 y.o. Female) Treating RN: Macarthur Critchley Primary Care Physician: FITZGERALD, DAVID Other Clinician: Referring Physician: FITZGERALD, DAVID Treating Physician/Extender: Frann Rider in Treatment: 0 Vital Signs Time Taken: 08:39 Temperature (F): 98.0 Height (in): 67 Pulse (bpm): 79 Source: Stated Blood Pressure (mmHg): 198/75 Weight (lbs): 300 Reference Range: 80  - 120 mg / dl Source: Stated Body Mass Index (BMI): 47 Electronic Signature(s) Signed: 06/21/2016 4:06:39 PM By: Rebecca Eaton RN, Sendra Entered By: Rebecca Eaton RN, Sendra on 06/21/2016 08:39:31

## 2016-06-25 DIAGNOSIS — E1165 Type 2 diabetes mellitus with hyperglycemia: Secondary | ICD-10-CM | POA: Diagnosis not present

## 2016-06-25 DIAGNOSIS — Z794 Long term (current) use of insulin: Secondary | ICD-10-CM | POA: Diagnosis not present

## 2016-06-25 DIAGNOSIS — E1129 Type 2 diabetes mellitus with other diabetic kidney complication: Secondary | ICD-10-CM | POA: Diagnosis not present

## 2016-06-25 DIAGNOSIS — E113313 Type 2 diabetes mellitus with moderate nonproliferative diabetic retinopathy with macular edema, bilateral: Secondary | ICD-10-CM | POA: Diagnosis not present

## 2016-06-25 DIAGNOSIS — L97909 Non-pressure chronic ulcer of unspecified part of unspecified lower leg with unspecified severity: Secondary | ICD-10-CM | POA: Diagnosis not present

## 2016-06-25 DIAGNOSIS — I890 Lymphedema, not elsewhere classified: Secondary | ICD-10-CM | POA: Diagnosis not present

## 2016-06-25 DIAGNOSIS — E1142 Type 2 diabetes mellitus with diabetic polyneuropathy: Secondary | ICD-10-CM | POA: Diagnosis not present

## 2016-06-28 ENCOUNTER — Encounter: Payer: 59 | Admitting: Surgery

## 2016-06-28 DIAGNOSIS — I890 Lymphedema, not elsewhere classified: Secondary | ICD-10-CM | POA: Diagnosis not present

## 2016-06-28 DIAGNOSIS — L97212 Non-pressure chronic ulcer of right calf with fat layer exposed: Secondary | ICD-10-CM | POA: Diagnosis not present

## 2016-06-28 DIAGNOSIS — L97222 Non-pressure chronic ulcer of left calf with fat layer exposed: Secondary | ICD-10-CM | POA: Diagnosis not present

## 2016-06-28 DIAGNOSIS — Z87891 Personal history of nicotine dependence: Secondary | ICD-10-CM | POA: Diagnosis not present

## 2016-06-28 DIAGNOSIS — B372 Candidiasis of skin and nail: Secondary | ICD-10-CM | POA: Diagnosis not present

## 2016-06-28 DIAGNOSIS — L97821 Non-pressure chronic ulcer of other part of left lower leg limited to breakdown of skin: Secondary | ICD-10-CM | POA: Diagnosis not present

## 2016-06-28 DIAGNOSIS — L97811 Non-pressure chronic ulcer of other part of right lower leg limited to breakdown of skin: Secondary | ICD-10-CM | POA: Diagnosis not present

## 2016-06-28 DIAGNOSIS — E11622 Type 2 diabetes mellitus with other skin ulcer: Secondary | ICD-10-CM | POA: Diagnosis not present

## 2016-06-29 NOTE — Progress Notes (Addendum)
JASENIA, PEWITT ([id number]) Visit Report for 06/28/2016 Chief Complaint Document Details Patient Name: Brooke Hopkins, Brooke Hopkins 06/28/2016 10:00 Date of Service: AM Medical Record [id number] Number: Patient Account Number: [id number] [birth year] (65 y.o. Treating RN: Macarthur Critchley Date of Birth/Sex: Female) Other Clinician: Primary Care Physician: FITZGERALD, DAVID Treating Christin Fudge Referring Physician: FITZGERALD, DAVID Physician/Extender: Suella Grove in Treatment: 1 Information Obtained from: Patient Chief Complaint Patients presents for treatment of an open diabetic ulcer and significant lymphedema which she's had for about 12 years Electronic Signature(s) Signed: 06/28/2016 10:59:33 AM By: Christin Fudge MD, FACS Entered By: Christin Fudge on 06/28/2016 10:59:32 [name] ([id number]) -------------------------------------------------------------------------------- Debridement Details Patient Name: Brooke Hopkins, Brooke Hopkins. 06/28/2016 10:00 Date of Service: AM Medical Record [id number] Number: Patient Account Number: [id number] Jul 16, 1951 (65 y.o. Treating RN: Macarthur Critchley Date of Birth/Sex: Female) Other Clinician: Primary Care Physician: FITZGERALD, DAVID Treating Christin Fudge Referring Physician: FITZGERALD, DAVID Physician/Extender: Suella Grove in Treatment: 1 Debridement Performed for Wound #1 Left,Circumferential Lower Leg Assessment: Performed By: Physician Christin Fudge, MD Debridement: Debridement Pre-procedure Yes Verification/Time Out Taken: Start Time: 10:50 Pain Control: Lidocaine 4% Topical Solution Level: Skin/Subcutaneous Tissue Total Area Debrided (L x 7 (cm) x 5 (cm) = 35 (cm) W): Tissue and other Viable, Non-Viable, Fibrin/Slough, Skin, Subcutaneous material debrided: Instrument: Curette Bleeding: Minimum Hemostasis Achieved: Pressure End Time: 10:55 Procedural Pain: 0 Post Procedural Pain: 0 Response to Treatment: Procedure was tolerated  well Post Debridement Measurements of Total Wound Length: (cm) 8 Width: (cm) 13 Depth: (cm) 0.3 Volume: (cm) 24.504 Post Procedure Diagnosis Same as Pre-procedure Electronic Signature(s) Signed: 06/28/2016 10:58:03 AM By: Christin Fudge MD, FACS Signed: 06/28/2016 1:43:21 PM By: Rebecca Eaton RN, Sendra Entered By: Christin Fudge on 06/28/2016 10:58:03 [name] ([id number]) -------------------------------------------------------------------------------- HPI Details Patient Name: Brooke Hopkins, Brooke Hopkins 06/28/2016 10:00 Date of Service: AM Medical Record [id number] Number: Patient Account Number: [id number] [birth year] (64 y.o. Treating RN: Macarthur Critchley Date of Birth/Sex: Female) Other Clinician: Primary Care Physician: FITZGERALD, DAVID Treating Christin Fudge Referring Physician: FITZGERALD, DAVID Physician/Extender: Weeks in Treatment: 1 History of Present Illness Location: massive swelling of both lower extremities and ulceration both lower extremities Quality: Patient reports experiencing a dull pain to affected area(s). Severity: Patient states wound are getting worse. Duration: Patient has had the wound for >12 years prior to seeking treatment at the wound center Timing: Pain in wound is constant (hurts all the time) Context: The wound appeared gradually over time Modifying Factors: Other treatment(s) tried include:she has a lymphedema pump but uses it seldom and she's had several course of antibiotics Associated Signs and Symptoms: Patient reports having difficulty standing for long periods. HPI Description: 65 year old patient seen by Dr. Ola Spurr of infectious disease who has been following up for left lower extremity cellulitis and ulcer with recurrent bilateral lower extremity problems for several months. Recently she had a large right lower extremity bullae which opened out and has been ulcerated. She has seen the vascular group and has been getting Unna's wraps  and has a lymphedema pump used in the past. Her prior cultures were positive for Pseudomonas, Proteus and was treated with amoxicillin. He has also been treated with 2 weeks course of ciprofloxacin and amoxicillin.. Increase of Lasix dose helped with the edema and echo showed no systolic CHF but Brooke Hopkins be diastolic problems. Past medical history significant for diabetes mellitus type 2, venous stasis ulcer, obesity, diabetic peripheral neuropathy, status post back surgery, cholecystectomy, hysterectomy, arthroscopy of the knee. He is a former smoker and quit smoking in 1984.  The patient has seen Dr. Delana Meyer who did not recommend any arterial or venous duplex studies and has been using Unna's wraps and also recommended a lymphedema pump. she has been very noncompliant with using these. 06/28/2016 -- the patient is still on antibiotics as prescribed by Dr. Ola Spurr and he is asked her to take it for 3 weeks. The patient also says she has a lot of redness and pain in the folds of her thigh and lower extremity and this is very painful. Electronic Signature(s) Signed: 06/28/2016 11:00:40 AM By: Christin Fudge MD, FACS Entered By: Christin Fudge on 06/28/2016 11:00:40 Brooke Hopkins, Brooke Hopkins ([id number]) -------------------------------------------------------------------------------- Physical Exam Details Patient Name: Brooke Hopkins, Brooke Hopkins 06/28/2016 10:00 Date of Service: AM Medical Record [id number] Number: Patient Account Number: [id number] [birth year] (64 y.o. Treating RN: Macarthur Critchley Date of Birth/Sex: Female) Other Clinician: Primary Care Physician: FITZGERALD, DAVID Treating Christin Fudge Referring Physician: FITZGERALD, DAVID Physician/Extender: Weeks in Treatment: 1 Constitutional . Pulse regular. Respirations normal and unlabored. Afebrile. . Eyes Nonicteric. Reactive to light. Ears, Nose, Mouth, and Throat Lips, teeth, and gums WNL.Marland Kitchen Moist mucosa without lesions. Neck supple and  nontender. No palpable supraclavicular or cervical adenopathy. Normal sized without goiter. Respiratory WNL. No retractions.. Breath sounds WNL, No rubs, rales, rhonchi, or wheeze.. Cardiovascular Heart rhythm and rate regular, no murmur or gallop.. Pedal Pulses WNL. No clubbing, cyanosis or edema. Chest Breasts symmetical and no nipple discharge.. Breast tissue WNL, no masses, lumps, or tenderness.. Lymphatic No adneopathy. No adenopathy. No adenopathy. Musculoskeletal Adexa without tenderness or enlargement.. Digits and nails w/o clubbing, cyanosis, infection, petechiae, ischemia, or inflammatory conditions.. Integumentary (Hair, Skin) No suspicious lesions. No crepitus or fluctuance. No peri-wound warmth or erythema. No masses.Marland Kitchen Psychiatric Judgement and insight Intact.. No evidence of depression, anxiety, or agitation.. Notes the patient continues to have stage II lymphedema which continues to weep significantly. The ulceration on the left lower extremity continues to have a lot of debris and I have sharply removed as much as possible with a #3 curet and bleeding controlled with pressure. He has a significant fungal infection in the region of her popliteal fossa and thigh. Electronic Signature(s) Signed: 06/28/2016 11:01:28 AM By: Christin Fudge MD, FACS Entered By: Christin Fudge on 06/28/2016 11:01:27 Brooke Hopkins, Brooke Hopkins ([id number], PUGLISE ([id number]) -------------------------------------------------------------------------------- Physician Orders Details Patient Name: SRINIDHI, SETSER 06/28/2016 10:00 Date of Service: AM Medical Record [id number] Number: Patient Account Number: [id number] [birth year] (65 y.o. 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 Wound #2 Right,Circumferential Lower Leg o Brooke Hopkins shower with protection. Primary Wound Dressing Wound #1 Left,Circumferential Lower Leg o Aquacel Ag Wound #2 Right,Circumferential Lower Leg o Aquacel Ag Secondary Dressing Wound #1 Left,Circumferential Lower Leg o ABD pad o Dry Gauze Wound #2 Right,Circumferential Lower Leg o ABD pad o Dry Gauze Dressing Change Frequency Brooke Hopkins, Brooke Hopkins ([id number]) Wound #1 Left,Circumferential Lower Leg o Change Dressing Monday, Wednesday, Friday Wound #2  Right,Circumferential Lower Leg o Change Dressing Monday, Wednesday, Friday Follow-up Appointments Wound #1 Left,Circumferential Lower Leg o Return Appointment in 1 week. Wound #2 Right,Circumferential Lower Leg o Return Appointment in 1 week. Edema Control Wound #1 Left,Circumferential Lower Leg o Other: - profore lite, continue using lymphedema pumps twice daily Wound #2 Right,Circumferential Lower Leg o Other: - profore lite, continue using lymphedema pumps twice daily Home Health Wound #1 Left,Circumferential Lower Leg o [place] Nurse Brooke Hopkins visit PRN to address patientos wound care needs. o FACE TO FACE ENCOUNTER: MEDICARE and MEDICAID PATIENTS: I certify that this patient is under my care and that I had a face-to-face encounter that meets the physician face-to-face encounter requirements with  this patient on this date. The encounter with the patient was in whole or in part for the following MEDICAL CONDITION: (primary reason for [place]) MEDICAL NECESSITY: I certify, that based on my findings, NURSING services are a medically necessary home health service. HOME BOUND STATUS: I certify that my clinical findings support that this patient is homebound (i.e., Due to illness or injury, pt requires aid of supportive devices such as crutches, cane, wheelchairs, walkers, the use of special transportation or the assistance of another person to leave their place of residence. There is a normal inability to leave the home and doing so requires considerable and taxing effort. Other absences are for medical reasons / religious services and are infrequent or of short duration when for other reasons). o Please direct any NON-WOUND related issues/requests for orders to patient's Primary Care Physician Wound #2 Right,Circumferential Lower Leg o [place] Nurse Brooke Hopkins visit PRN to address patientos wound care needs. o FACE TO FACE ENCOUNTER: MEDICARE and MEDICAID PATIENTS: I certify that this patient is under my care and that I had a face-to-face encounter that meets the physician face-to-face encounter requirements with this patient on this date. The encounter with the patient was in whole or in part for the following MEDICAL CONDITION: (primary reason for [place]) MEDICAL NECESSITY: I certify, that based on my findings, NURSING services are a medically necessary home health service. HOME BOUND STATUS: I certify that my clinical findings Brooke Hopkins, Brooke Hopkins ([id number]) support that this patient is homebound (i.e., Due to illness or injury, pt requires aid of supportive devices such as crutches, cane, wheelchairs, walkers, the use of special transportation or the assistance of another person to leave their place of residence. There is a normal  inability to leave the home and doing so requires considerable and taxing effort. Other absences are for medical reasons / religious services and are infrequent or of short duration when for other reasons). o Please direct any NON-WOUND related issues/requests for orders to patient's Primary Care Physician Patient Medications Allergies: no know allergies Notifications Medication Indication Start End Lotrisone 06/28/2016 DOSE topical 1 %-0.05 % cream - cream topical apply daily as directed Electronic Signature(s) Signed: 06/28/2016 1:43:21 PM By: Ardean Larsen Signed: 06/28/2016 4:47:37 PM By: Christin Fudge MD, FACS Previous Signature: 06/28/2016 10:59:11 AM Version By: Christin Fudge MD, FACS Entered By: Rebecca Eaton RN, Sendra on 06/28/2016 12:14:00 [name] ([id number]) -------------------------------------------------------------------------------- Problem List Details Patient Name: Brooke Hopkins, Brooke Hopkins 06/28/2016 10:00 Date of Service: AM Medical Record [id number] Number: Patient Account Number: [id number] 07-27-1951 (65 y.o. Treating RN: Macarthur Critchley Date of Birth/Sex: Female) Other Clinician: Primary Care Physician: FITZGERALD, DAVID Treating Christin Fudge Referring Physician: FITZGERALD, DAVID Physician/Extender: Suella Grove in Treatment: 1 Active Problems  ICD-10 Encounter Code Description Active Date Diagnosis E11.622 Type 2 diabetes mellitus with other skin ulcer 06/21/2016 Yes I89.0 Lymphedema, not elsewhere classified 06/21/2016 Yes L97.222 Non-pressure chronic ulcer of left calf with fat layer 06/21/2016 Yes exposed L97.212 Non-pressure chronic ulcer of right calf with fat layer 06/21/2016 Yes exposed E66.01 Morbid (severe) obesity due to excess calories 06/21/2016 Yes B37.2 Candidiasis of skin and nail 06/28/2016 Yes Inactive Problems Resolved Problems Electronic Signature(s) Signed: 06/28/2016 10:56:39 AM By: Christin Fudge MD, FACS Entered By: Christin Fudge on 06/28/2016 10:56:38 [name] ([id number]) -------------------------------------------------------------------------------- Progress Note Details Patient Name: MAYDA, HOUTMAN 06/28/2016 10:00 Date of Service: AM Medical Record [id number] Number: Patient Account Number: [id number] 1951/01/06 (64 y.o. Treating RN: Macarthur Critchley Date of Birth/Sex: Female) Other Clinician: Primary Care Physician: FITZGERALD, DAVID Treating Christin Fudge Referring Physician: FITZGERALD, DAVID Physician/Extender: Weeks in Treatment: 1 Subjective Chief Complaint Information obtained from Patient Patients presents for treatment of an open diabetic ulcer and significant lymphedema which she's had for about 12 years History of Present Illness (HPI) The following HPI elements were documented for the patient's wound: Location: massive swelling of both lower extremities and ulceration both lower extremities Quality: Patient reports experiencing a dull pain to affected area(s). Severity: Patient states wound are getting worse. Duration: Patient has had the wound for >12 years prior to seeking treatment at the wound center Timing: Pain in wound is constant (hurts all the time) Context: The wound appeared gradually over time Modifying Factors: Other treatment(s) tried include:she has a lymphedema pump but uses it seldom and she's had several course of antibiotics Associated Signs and Symptoms: Patient reports having difficulty standing for long periods. 65 year old patient seen by Dr. Ola Spurr of infectious disease who has been following up for left lower extremity cellulitis and ulcer with recurrent bilateral lower extremity problems for several months. Recently she had a large right lower extremity bullae which opened out and has been ulcerated. She has seen the vascular group and has been getting Unna's wraps and has a lymphedema pump used in the past. Her prior cultures were  positive for Pseudomonas, Proteus and was treated with amoxicillin. He has also been treated with 2 weeks course of ciprofloxacin and amoxicillin.. Increase of Lasix dose helped with the edema and echo showed no systolic CHF but Brooke Hopkins be diastolic problems. Past medical history significant for diabetes mellitus type 2, venous stasis ulcer, obesity, diabetic peripheral neuropathy, status post back surgery, cholecystectomy, hysterectomy, arthroscopy of the knee. He is a former smoker and quit smoking in 1984. The patient has seen Dr. Delana Meyer who did not recommend any arterial or venous duplex studies and has been using Unna's wraps and also recommended a lymphedema pump. she has been very noncompliant with using these. 06/28/2016 -- the patient is still on antibiotics as prescribed by Dr. Ola Spurr and he is asked her to take it for 3 weeks. The patient also says she has a lot of redness and pain in the folds of her thigh and lower extremity and this is very painful. Brooke Hopkins, [id number] ([id number]) Objective Constitutional Pulse regular. Respirations normal and unlabored. Afebrile. Vitals Time Taken: 10:30 AM, Height: 67 in, Weight: 300 lbs, BMI: 47, Temperature: 98.3 F, Pulse: 83 bpm, Blood Pressure: 225/69 mmHg. General Notes: patient has hx of HTN, will follow up with PCP Eyes Nonicteric. Reactive to light. Ears, Nose, Mouth, and Throat Lips, teeth, and gums WNL.Marland Kitchen Moist mucosa without lesions. Neck supple and nontender. No palpable supraclavicular or cervical adenopathy. Normal sized  without goiter. Respiratory WNL. No retractions.. Breath sounds WNL, No rubs, rales, rhonchi, or wheeze.. Cardiovascular Heart rhythm and rate regular, no murmur or gallop.. Pedal Pulses WNL. No clubbing, cyanosis or edema. Chest Breasts symmetical and no nipple discharge.. Breast tissue WNL, no masses, lumps, or tenderness.. Lymphatic No adneopathy. No adenopathy. No adenopathy. Musculoskeletal Adexa  without tenderness or enlargement.. Digits and nails w/o clubbing, cyanosis, infection, petechiae, ischemia, or inflammatory conditions.Marland Kitchen Psychiatric Judgement and insight Intact.. No evidence of depression, anxiety, or agitation.. General Notes: the patient continues to have stage II lymphedema which continues to weep significantly. The ulceration on the left lower extremity continues to have a lot of debris and I have sharply removed as much as possible with a #3 curet and bleeding controlled with pressure. He has a significant fungal infection in the region of her popliteal fossa and thigh. Brooke Hopkins, [id number] ([id number]) Integumentary (Hair, Skin) No suspicious lesions. No crepitus or fluctuance. No peri-wound warmth or erythema. No masses.. Wound #1 status is Open. Original cause of wound was Gradually Appeared. The wound is located on the Left,Circumferential Lower Leg. The wound measures 8cm length x 13cm width x 0.3cm depth; 81.681cm^2 area and 24.504cm^3 volume. The wound is limited to skin breakdown. There is a large amount of serous drainage noted. The wound margin is flat and intact. There is medium (34-66%) granulation within the wound bed. There is a small (1-33%) amount of necrotic tissue within the wound bed including Adherent Slough. The periwound skin appearance did not exhibit: Callus, Crepitus, Excoriation, Fluctuance, Friable, Induration, Localized Edema, Rash, Scarring, Dry/Scaly, Maceration, Moist, Atrophie Blanche, Cyanosis, Ecchymosis, Hemosiderin Staining, Mottled, Pallor, Rubor, Erythema. Wound #2 status is Open. Original cause of wound was Gradually Appeared. The wound is located on the Right,Circumferential Lower Leg. The wound measures 14cm length x 24cm width x 0.2cm depth; 263.894cm^2 area and 52.779cm^3 volume. 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 Silver alginate to the wounds and Profore lite to be applied to both lower extremities and change 3 times a week. 2. recent culture reports were reviewed by Dr. Ola Spurr and he has asked her to take ciprofloxacin and amoxicillin for 3 weeks 3. I have put her on Lotrisone cream to be applied daily to the areas of fungal infection in the popliteal fossa area 4. Elevation and exercise 5. Good control of her diabetes mellitus. 6. Regular visits to the wound center. 7. using a lymphedema pumps for an hour each twice a day SCOTT, MALLICOAT. ([id number]) I have spent some time discussing with the patient and her husband the importance of compliance and the fact that she has all the equipment but has not been using it appropriately. They both understand and say they will be compliant. Procedures Wound #1 Wound #1 is a Lymphedema located on the Left,Circumferential Lower Leg . There was a Skin/Subcutaneous Tissue Debridement [id number]) debridement with total area of 35 sq cm performed by Christin Fudge, MD. with the following instrument(s): Curette to remove Viable and Non-Viable tissue/material including Fibrin/Slough, Skin, and Subcutaneous after achieving pain control using Lidocaine 4% Topical Solution. A time out was conducted prior to the start of the  procedure. A Minimum amount of bleeding was controlled with Pressure. The procedure was tolerated well with a pain level of 0 throughout and a pain level of 0 following the procedure. Post Debridement Measurements: 8cm length x 13cm width x 0.3cm depth; 24.504cm^3 volume. Post procedure Diagnosis Wound #1: Same as Pre-Procedure Plan Wound Cleansing: Wound #1  Left,Circumferential Lower Leg: Brooke Hopkins shower with protection. Wound #2 Right,Circumferential Lower Leg: Brooke Hopkins shower with protection. Primary Wound Dressing: Wound #1 Left,Circumferential Lower Leg: Aquacel Ag Wound #2 Right,Circumferential Lower Leg: Aquacel Ag Secondary Dressing: Wound #1 Left,Circumferential Lower Leg: ABD pad Dry Gauze Wound #2 Right,Circumferential Lower Leg: ABD pad Dry Gauze Dressing Change Frequency: Wound #1 Left,Circumferential Lower Leg: Change Dressing Monday, Wednesday, Friday Wound #2 Right,Circumferential Lower Leg: Change Dressing Monday, Wednesday, Friday Follow-up Appointments: Wound #1 Left,Circumferential Lower Leg: Return Appointment in 1 week. DEREONNA, [id number] ([id number]) Wound #2 Right,Circumferential Lower Leg: Return Appointment in 1 week. Edema Control: Wound #1 Left,Circumferential Lower Leg: Other: - profore lite, continue using lymphedema pumps twice daily Wound #2 Right,Circumferential Lower Leg: Other: - profore lite, continue using lymphedema pumps twice daily Home Health: Wound #1 Left,Circumferential Lower Leg: [place] Nurse Brooke Hopkins visit PRN to address patient s wound care needs. FACE TO FACE ENCOUNTER: MEDICARE and MEDICAID PATIENTS: I certify that this patient is under my care and that I had a face-to-face encounter that meets the physician face-to-face encounter requirements with this patient on this date. The encounter with the patient was in whole or in part for the following MEDICAL CONDITION: (primary reason for [place]) MEDICAL NECESSITY: I certify, that based on my findings, NURSING services are a medically necessary home health service. HOME BOUND STATUS: I certify that my clinical findings support that this patient is homebound (i.e., Due to illness or injury, pt requires aid of supportive devices such as crutches, cane, wheelchairs, walkers, the use of special transportation  or the assistance of another person to leave their place of residence. There is a normal inability to leave the home and doing so requires considerable and taxing effort. Other absences are for medical reasons / religious services and are infrequent or of short duration when for other reasons). Please direct any NON-WOUND related issues/requests for orders to patient's Primary Care Physician Wound #2 Right,Circumferential Lower Leg: [place] Nurse Brooke Hopkins visit PRN to address patient s wound care needs. FACE TO FACE ENCOUNTER: MEDICARE and MEDICAID PATIENTS: I certify that this patient is under my care and that I had a face-to-face encounter that meets the physician face-to-face encounter requirements with this patient on this date. The encounter with the patient was in whole or in part for the following MEDICAL CONDITION: (primary reason for Reagan) MEDICAL NECESSITY: I certify, that based on my findings, NURSING services are a medically necessary home health service. HOME BOUND STATUS: I certify that my clinical findings support that this patient is homebound (i.e., Due to illness or injury, pt requires aid of supportive devices such as crutches, cane, wheelchairs, walkers, the use of special transportation or the assistance of another person to leave their place of residence. There is a normal inability to leave the home and doing so requires considerable and taxing effort. Other absences are for medical reasons / religious services and are infrequent or of short duration when for other reasons). Please direct any NON-WOUND related issues/requests for orders to patient's Primary Care Physician The following medication(s) was prescribed: Lotrisone topical 1 %-0.05 %  cream cream topical apply daily as directed starting 06/28/2016 After thorough review I recommended: 1. Silver alginate to the wounds and Profore lite to be applied to both lower extremities and  change 3 times a week. TILLY, [id number] ([id number]) 2. recent culture reports were reviewed by Dr. Ola Spurr and he has asked her to take ciprofloxacin and amoxicillin for 3 weeks 3. I have put her on Lotrisone cream to be applied daily to the areas of fungal infection in the popliteal fossa area 4. Elevation and exercise 5. Good control of her diabetes mellitus. 6. Regular visits to the wound center. 7. using a lymphedema pumps for an hour each twice a day I have spent some time discussing with the patient and her husband the importance of compliance and the fact that she has all the equipment but has not been using it appropriately. They both understand and say they will be compliant. Electronic Signature(s) Signed: 06/28/2016 4:49:24 PM By: Christin Fudge MD, FACS Previous Signature: 06/28/2016 4:49:16 PM Version By: Christin Fudge MD, FACS Previous Signature: 06/28/2016 11:03:11 AM Version By: Christin Fudge MD, FACS Entered By: Christin Fudge on 06/28/2016 16:49:24 [name] ([id number]) -------------------------------------------------------------------------------- SuperBill Details Patient Name: TATIONA, SUMMIT. Date of Service: 06/28/2016 Medical Record Number: [id number] Patient Account Number: [id number] Date of Birth/Sex: 1951/12/08 (65 y.o. 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 E66.01 Morbid (severe) obesity due to excess calories B37.2 Candidiasis of skin and nail Facility Procedures CPT4 Code:  [id number] Description: [id number] - DEB SUBQ TISSUE 20 SQ CM/< ICD-10 Description Diagnosis E11.622 Type 2 diabetes mellitus with other skin ulcer I89.0 Lymphedema, not elsewhere classified L97.222 Non-pressure chronic ulcer of left calf with fat l Modifier: ayer exposed Quantity: 1 CPT4 Code: [id number] Description: 11045 - DEB SUBQ TISS EA ADDL 20CM ICD-10 Description Diagnosis E11.622 Type 2 diabetes mellitus with other skin ulcer I89.0 Lymphedema, not elsewhere classified L97.222 Non-pressure chronic ulcer of left calf with fat l Modifier: ayer exposed Quantity: 1 Physician Procedures CPT4 Code: [id number] Demonte, LI Description: 99213 - WC PHYS LEVEL 3 - EST PT ICD-10 Description Diagnosis E11.622 Type 2 diabetes mellitus with other skin ulcer I89.0 Lymphedema, not elsewhere classified B37.2 Candidiasis of skin and nail L97.222 Non-pressure chronic ulcer of left  calf with fat la NDA M. ([id number]) Modifier: yer exposed Quantity: 1 Electronic Signature(s) Signed: 06/28/2016 11:03:48 AM By: Christin Fudge MD, FACS Entered By: Christin Fudge on 06/28/2016 11:03:48

## 2016-06-29 NOTE — Progress Notes (Signed)
ESMEE, FRYBARGER ([id number]) Visit Report for 06/28/2016 Arrival Information Details Patient Name: Brooke Hopkins, Brooke Hopkins 06/28/2016 10:00 Date of Service: AM Medical Record [id number] Number: Patient Account Number: [id number] [birth year] (64 y.o. Treating RN: Macarthur Critchley Date of Birth/Sex: Female) Other Clinician: Primary Care Physician: FITZGERALD, DAVID Treating Christin Fudge Referring Physician: FITZGERALD, DAVID Physician/Extender: Suella Grove in Treatment: 1 Visit Information History Since Last Visit All ordered tests and consults were completed: No Patient Arrived: Wheel Chair Added or deleted any medications: No Arrival Time: 10:23 Any new allergies or adverse reactions: No Transfer Assistance: None Had a fall or experienced change in No activities of daily living that may affect Patient Identification Verified: Yes risk of falls: Secondary Verification Process Yes Signs or symptoms of abuse/neglect since last No Completed: visito Hospitalized since last visit: No Has Dressing in Place as Prescribed: Yes Has Compression in Place as Prescribed: Yes Pain Present Now: No Electronic Signature(s) Signed: 06/28/2016 1:43:21 PM By: Rebecca Eaton, RN, Sendra Entered By: Rebecca Eaton RN, Sendra on 06/28/2016 10:25:20 Brooke Hopkins ([id number]) -------------------------------------------------------------------------------- Encounter Discharge Information Details Patient Name: Brooke Hopkins, Brooke Hopkins. 06/28/2016 10:00 Date of Service: AM Medical Record [id number] Number: Patient Account Number: [id number] Dec 29, 1951 (65 y.o. Treating RN: Macarthur Critchley Date of Birth/Sex: Female) Other Clinician: Primary Care Physician: FITZGERALD, DAVID Treating Christin Fudge Referring Physician: FITZGERALD, DAVID Physician/Extender: Suella Grove in Treatment: 1 Encounter Discharge Information Items Facility Notification Discharge Pain Level: 4 Facility Type: Home Health Discharge Condition:  Stable Orders Sent: Yes Ambulatory Status: Wheelchair Discharge Destination: Home Private Transportation: Auto Schedule Follow-up Appointment: Yes Medication Reconciliation completed Yes and provided to Patient/Care Norleen Xie: Clinical Summary of Care: Electronic Signature(s) Signed: 06/28/2016 1:43:21 PM By: Rebecca Eaton RN, Sendra Entered By: Rebecca Eaton RN, Sendra on 06/28/2016 10:37:10 Brooke Hopkins ([id number]) -------------------------------------------------------------------------------- Lower Extremity Assessment Details Patient Name: Brooke Hopkins, Brooke Hopkins 06/28/2016 10:00 Date of Service: AM Medical Record [id number] Number: Patient Account Number: [id number] [birth year] (64 y.o. Treating RN: Macarthur Critchley Date of Birth/Sex: Female) Other Clinician: Primary Care Physician: FITZGERALD, DAVID Treating Christin Fudge Referring Physician: FITZGERALD, DAVID Physician/Extender: Weeks in Treatment: 1 Edema Assessment Assessed: [Left: No] [Right: No] E[Left: dema] [Right: :] Calf Left: Right: Point of Measurement: 32 cm From Medial Instep 55 cm 60 cm Ankle Left: Right: Point of Measurement: 11 cm From Medial Instep 35 cm 32 cm Electronic Signature(s) Signed: 06/28/2016 1:43:21 PM By: Rebecca Eaton, RN, Sendra Entered By: Rebecca Eaton RN, Sendra on 06/28/2016 10:31:05 Brooke Hopkins ([id number]) -------------------------------------------------------------------------------- Pain Assessment Details Patient Name: Brooke Hopkins, Brooke Hopkins 06/28/2016 10:00 Date of Service: AM Medical Record [id number] Number: Patient Account Number: [id number] [birth year] (65 y.o. Treating RN: Macarthur Critchley Date of Birth/Sex: Female) Other Clinician: Primary Care Physician: FITZGERALD, DAVID Treating Christin Fudge Referring Physician: FITZGERALD, DAVID Physician/Extender: Suella Grove in Treatment: 1 Active Problems Location of Pain Severity and Description of Pain Patient Has Paino No Site  Locations Pain Management and Medication Current Pain Management: Electronic Signature(s) Signed: 06/28/2016 1:43:21 PM By: Rebecca Eaton, RN, Sendra Entered By: Rebecca Eaton RN, Sendra on 06/28/2016 10:25:27 Brooke Hopkins ([id number]) -------------------------------------------------------------------------------- Patient/Caregiver Education Details Patient Name: Brooke Hopkins, Brooke Hopkins 06/28/2016 10:00 Date of Service: AM Medical Record [id number] Number: Patient Account Number: [id number] May 24, 1951 (65 y.o. Treating RN: Macarthur Critchley Date of Birth/Gender: Female) Other Clinician: Primary Care Physician: FITZGERALD, DAVID Treating Christin Fudge Referring Physician: FITZGERALD, DAVID Physician/Extender: Suella Grove in Treatment: 1 Education Assessment Education Provided To: Patient Education Topics Provided Wound/Skin Impairment: Handouts: Caring for Your Ulcer, Skin Care Do's and Dont's Methods: Explain/Verbal Responses: State content correctly Electronic Signature(s) Signed:  06/28/2016 1:43:21 PM By: Rebecca Eaton RN, Romero Liner By: Rebecca Eaton RN, Sendra on 06/28/2016 10:37:21 BRAILEY, LUPOLI ([id number]) -------------------------------------------------------------------------------- Wound Assessment Details Patient Name: Brooke Hopkins, Brooke Hopkins 06/28/2016 10:00 Date of Service: AM Medical Record [id number] Number: Patient Account Number: [id number] January 14, 1951 (64 y.o. Treating RN: Macarthur Critchley Date of Birth/Sex: Female) Other Clinician: Primary Care Physician: FITZGERALD, DAVID Treating Christin Fudge Referring Physician: FITZGERALD, DAVID Physician/Extender: Weeks in Treatment: 1 Wound Status Wound Number: 1 Primary Etiology: Lymphedema Wound Location: Left Lower Leg - Wound Status: Open Circumfernential Wounding Event: Gradually Appeared Date Acquired: 12/31/2015 Weeks Of Treatment: 1 Clustered Wound: No Photos Photo Uploaded By: Rebecca Eaton, RN, Roslynn Amble on 06/28/2016  13:38:55 Wound Measurements Length: (cm) 8 % Reduction in Ar Width: (cm) 13 % Reduction in Vo Depth: (cm) 0.3 Epithelialization Area: (cm) 81.681 Volume: (cm) 24.504 ea: 18.8% lume: -21.9% : None Wound Description Classification: Partial Thickness Foul Odor After Wound Margin: Flat and Intact Exudate Amount: Large Exudate Type: Serous Exudate Color: amber Cleansing: No Wound Bed Granulation Amount: Medium (34-66%) Exposed Structure Necrotic Amount: Small (1-33%) Fascia Exposed: No Brooke Hopkins, Brooke Hopkins ([id number]) Necrotic Quality: Adherent Slough Fat Layer Exposed: No Tendon Exposed: No Muscle Exposed: No Joint Exposed: No Bone Exposed: No Limited to Skin Breakdown Periwound Skin Texture Texture Color No Abnormalities Noted: No No Abnormalities Noted: No Callus: No Atrophie Blanche: No Crepitus: No Cyanosis: No Excoriation: No Ecchymosis: No Fluctuance: No Erythema: No Friable: No Hemosiderin Staining: No Induration: No Mottled: No Localized Edema: No Pallor: No Rash: No Rubor: No Scarring: No Moisture No Abnormalities Noted: No Dry / Scaly: No Maceration: No Moist: No Wound Preparation Ulcer Cleansing: Other: soap and water, Topical Anesthetic Applied: Other: lidocaine 4%, Treatment Notes Wound #1 (Left, Circumferential Lower Leg) 1. Cleansed with: Cleanse wound with antibacterial soap and water 2. Anesthetic Topical Lidocaine 4% cream to wound bed prior to debridement 4. Dressing Applied: Aquacel Ag 5. Secondary Dressing Applied ABD Pad Dry Gauze 7. Secured with Other (specify in notes) Notes profore lite Electronic Signature(s) Brooke Hopkins, Brooke Hopkins ([id number]) Signed: 06/28/2016 1:43:21 PM By: Rebecca Eaton RN, Sendra Entered By: Rebecca Eaton RN, Sendra on 06/28/2016 10:55:27 Brooke Hopkins ([id number]) -------------------------------------------------------------------------------- Wound Assessment Details Patient Name: Brooke Hopkins, Brooke Hopkins 06/28/2016 10:00 Date of Service: AM Medical Record [id number] Number: Patient Account Number: [id number] 1951/09/26 (65 y.o. Treating RN: Macarthur Critchley Date of Birth/Sex: Female) Other Clinician: Primary Care Physician: FITZGERALD, DAVID Treating Christin Fudge Referring Physician: FITZGERALD, DAVID Physician/Extender: Weeks in Treatment: 1 Wound Status Wound Number: 2 Primary Etiology: Lymphedema Wound Location: Right, Circumferential Lower Wound Status: Open Leg Wounding Event: Gradually Appeared Date Acquired: 12/31/2015 Weeks Of Treatment: 1 Clustered Wound: Yes Photos Photo Uploaded By: Rebecca Eaton RN, Roslynn Amble on 06/28/2016 13:38:55 Wound Measurements Length: (cm) 14 Width: (cm) 24 Depth: (cm) 0.2 Area: (cm) 263.894 Volume: (cm) 52.779 % Reduction in Area: -16.7% % Reduction in Volume: -16.7% Wound Description Classification: Partial Thickness Periwound Skin Texture Texture Color No Abnormalities Noted: No No Abnormalities Noted: No Moisture No Abnormalities Noted: No Brooke Hopkins, KETCH. ([id number]) Treatment Notes Wound #2 (Right, Circumferential Lower Leg) 1. Cleansed with: Cleanse wound with antibacterial soap and water 2. Anesthetic Topical Lidocaine 4% cream to wound bed prior to debridement 4. Dressing Applied: Aquacel Ag 5. Secondary Dressing Applied ABD Pad Dry Gauze 7. Secured with Other (specify in notes) Notes profore lite Electronic Signature(s) Signed: 06/28/2016 1:43:21 PM By: Rebecca Eaton, RN, Sendra Entered By: Rebecca Eaton RN, Sendra on 06/28/2016 10:55:15 Brooke Hopkins ([id number]) -------------------------------------------------------------------------------- Vitals Details Patient Name: Brooke Hopkins,  Brooke M. 06/28/2016 10:00 Date of Service: AM Medical Record [id number] Number: Patient Account Number: [id number] [birth year] (64 y.o. Treating RN: Macarthur Critchley Date of Birth/Sex: Female) Other Clinician: Primary Care  Physician: FITZGERALD, DAVID Treating Christin Fudge Referring Physician: FITZGERALD, DAVID Physician/Extender: Weeks in Treatment: 1 Vital Signs Time Taken: 10:30 Temperature (F): 98.3 Height (in): 67 Pulse (bpm): 83 Weight (lbs): 300 Blood Pressure (mmHg): 225/69 Body Mass Index (BMI): 47 Reference Range: 80 - 120 mg / dl Notes patient has hx of HTN, will follow up with PCP Electronic Signature(s) Signed: 06/28/2016 1:43:21 PM By: Rebecca Eaton, RN, Sendra Entered By: Rebecca Eaton RN, Sendra on 06/28/2016 12:10:08

## 2016-07-05 ENCOUNTER — Encounter: Payer: 59 | Attending: Surgery | Admitting: Surgery

## 2016-07-05 DIAGNOSIS — E1140 Type 2 diabetes mellitus with diabetic neuropathy, unspecified: Secondary | ICD-10-CM | POA: Diagnosis not present

## 2016-07-05 DIAGNOSIS — B372 Candidiasis of skin and nail: Secondary | ICD-10-CM | POA: Diagnosis not present

## 2016-07-05 DIAGNOSIS — L97222 Non-pressure chronic ulcer of left calf with fat layer exposed: Secondary | ICD-10-CM | POA: Diagnosis not present

## 2016-07-05 DIAGNOSIS — L97811 Non-pressure chronic ulcer of other part of right lower leg limited to breakdown of skin: Secondary | ICD-10-CM | POA: Diagnosis not present

## 2016-07-05 DIAGNOSIS — L97212 Non-pressure chronic ulcer of right calf with fat layer exposed: Secondary | ICD-10-CM | POA: Diagnosis not present

## 2016-07-05 DIAGNOSIS — Z87891 Personal history of nicotine dependence: Secondary | ICD-10-CM | POA: Insufficient documentation

## 2016-07-05 DIAGNOSIS — L97821 Non-pressure chronic ulcer of other part of left lower leg limited to breakdown of skin: Secondary | ICD-10-CM | POA: Diagnosis not present

## 2016-07-05 DIAGNOSIS — I890 Lymphedema, not elsewhere classified: Secondary | ICD-10-CM | POA: Diagnosis not present

## 2016-07-05 DIAGNOSIS — Z6841 Body Mass Index (BMI) 40.0 and over, adult: Secondary | ICD-10-CM | POA: Insufficient documentation

## 2016-07-05 DIAGNOSIS — E11622 Type 2 diabetes mellitus with other skin ulcer: Secondary | ICD-10-CM | POA: Insufficient documentation

## 2016-07-06 NOTE — Progress Notes (Signed)
Brooke, Hopkins ([id number]) Visit Report for 07/05/2016 Chief Complaint Document Details Patient Name: Brooke Hopkins, Brooke Hopkins. Date of Service: 07/05/2016 12:45 PM Medical Record Number: [id number] Patient Account Number: [id number] Date of Birth/Sex: 1951-04-14 (64 y.o. Female) Treating RN: Leane Call Primary Care Physician: FITZGERALD, DAVID Other Clinician: Referring Physician: FITZGERALD, DAVID Treating Physician/Extender: Frann Rider in Treatment: 2 Information Obtained from: Patient Chief Complaint Patients presents for treatment of an open diabetic ulcer and significant lymphedema which she's had for about 12 years Electronic Signature(s) Signed: 07/05/2016 1:38:06 PM By: Christin Fudge MD, FACS Entered By: Christin Fudge on 07/05/2016 13:38:06 [name] ([id number]) -------------------------------------------------------------------------------- HPI Details Patient Name: Brooke, Hopkins. Date of Service: 07/05/2016 12:45 PM Medical Record Number: [id number] Patient Account Number: [id number] Date of Birth/Sex: 22-Nov-1951 (64 y.o. Female) Treating RN: Leane Call Primary Care Physician: FITZGERALD, DAVID Other Clinician: Referring Physician: FITZGERALD, DAVID Treating Physician/Extender: Frann Rider in Treatment: 2 History of Present Illness Location: massive swelling of both lower extremities and ulceration both lower extremities Quality: Patient reports experiencing a dull pain to affected area(s). Severity: Patient states wound are getting worse. Duration: Patient has had the wound for >12 years prior to seeking treatment at the wound center Timing: Pain in wound is constant (hurts all the time) Context: The wound appeared gradually over time Modifying Factors: Other treatment(s) tried include:she has a lymphedema pump but uses it seldom and she's had several course of antibiotics Associated Signs and Symptoms: Patient reports having  difficulty standing for long periods. HPI Description: 65 year old patient seen by Dr. Ola Spurr of infectious disease who has been following up for left lower extremity cellulitis and ulcer with recurrent bilateral lower extremity problems for several months. Recently she had a large right lower extremity bullae which opened out and has been ulcerated. She has seen the vascular group and has been getting Unna's wraps and has a lymphedema pump used in the past. Her prior cultures were positive for Pseudomonas, Proteus and was treated with amoxicillin. He has also been treated with 2 weeks course of ciprofloxacin and amoxicillin.. Increase of Lasix dose helped with the edema and echo showed no systolic CHF but may be diastolic problems. Past medical history significant for diabetes mellitus type 2, venous stasis ulcer, obesity, diabetic peripheral neuropathy, status post back surgery, cholecystectomy, hysterectomy, arthroscopy of the knee. He is a former smoker and quit smoking in 1984. The patient has seen Dr. Delana Meyer who did not recommend any arterial or venous duplex studies and has been using Unna's wraps and also recommended a lymphedema pump. she has been very noncompliant with using these. 06/28/2016 -- the patient is still on antibiotics as prescribed by Dr. Ola Spurr and he is asked her to take it for 3 weeks. The patient also says she has a lot of redness and pain in the folds of her thigh and lower extremity and this is very painful. Electronic Signature(s) Signed: 07/05/2016 1:38:11 PM By: Christin Fudge MD, FACS Entered By: Christin Fudge on 07/05/2016 13:38:11 [name] ([id number]) -------------------------------------------------------------------------------- Physical Exam Details Patient Name: Brooke, Hopkins. Date of Service: 07/05/2016 12:45 PM Medical Record Number: [id number] Patient Account Number: [id number] Date of Birth/Sex: 11-May-1951 (64 y.o.  Female) Treating RN: Leane Call Primary Care Physician: FITZGERALD, DAVID Other Clinician: Referring Physician: FITZGERALD, DAVID Treating Physician/Extender: Christin Fudge Weeks in Treatment: 2 Constitutional . Pulse regular. Respirations normal and unlabored. Afebrile. . Eyes Nonicteric. Reactive to light. Ears, Nose, Mouth, and Throat Lips, teeth, and gums WNL.Marland Kitchen Moist mucosa  without lesions. Neck supple and nontender. No palpable supraclavicular or cervical adenopathy. Normal sized without goiter. Respiratory WNL. No retractions.. Cardiovascular Pedal Pulses WNL. No clubbing, cyanosis or edema. Lymphatic No adneopathy. No adenopathy. No adenopathy. Musculoskeletal Adexa without tenderness or enlargement.. Digits and nails w/o clubbing, cyanosis, infection, petechiae, ischemia, or inflammatory conditions.. Integumentary (Hair, Skin) No suspicious lesions. No crepitus or fluctuance. No peri-wound warmth or erythema. No masses.Marland Kitchen Psychiatric Judgement and insight Intact.. No evidence of depression, anxiety, or agitation.. Notes she continues to have significant edema but there is no cellulitis. The ulcerations on both lower extremity is continued to have fixed debris have been able to wash most of it out with abrasive technique with gauze dipped in saline. Electronic Signature(s) Signed: 07/05/2016 1:38:57 PM By: Christin Fudge MD, FACS Entered By: Christin Fudge on 07/05/2016 13:38:57 [name] ([id number]) -------------------------------------------------------------------------------- Physician Orders Details Patient Name: Brooke, Hopkins. Date of Service: 07/05/2016 12:45 PM Medical Record Number: [id number] Patient Account Number: [id number] Date of Birth/Sex: 1951-07-25 (64 y.o. Female) Treating RN: Leane Call Primary Care Physician: FITZGERALD, DAVID Other Clinician: Referring Physician: FITZGERALD, DAVID Treating Physician/Extender: Frann Rider in Treatment: 2 Verbal / Phone Orders: No Diagnosis Coding Wound Cleansing Wound #1 Left,Circumferential Lower Leg o May shower with protection. Wound #2 Right,Circumferential Lower Leg o May shower with protection. Primary Wound Dressing Wound #1 Left,Circumferential Lower Leg o Aquacel Ag Wound #2 Right,Circumferential Lower Leg o Aquacel Ag Secondary Dressing Wound #1 Left,Circumferential Lower Leg o ABD pad o Dry Gauze Wound #2 Right,Circumferential Lower Leg o ABD pad o Dry Gauze Dressing Change Frequency Wound #1 Left,Circumferential Lower Leg o Change Dressing Monday, Wednesday, Friday Wound #2 Right,Circumferential Lower Leg o Change Dressing Monday, Wednesday, Friday Follow-up Appointments Wound #1 Left,Circumferential Lower Leg o Return Appointment in 1 week. Wound #2 Right,Circumferential Lower Leg o Return Appointment in 1 week. KATLYND, [facility] ([id number]) Edema Control Wound #1 Left,Circumferential Lower Leg o Other: - profore lite, continue using lymphedema pumps twice daily Wound #2 Right,Circumferential Lower Leg o Other: - profore lite, continue using lymphedema pumps twice daily Home Health Wound #1 Left,Circumferential Lower Leg o [place] Nurse may visit PRN to address patientos wound care needs. o FACE TO FACE ENCOUNTER: MEDICARE and MEDICAID PATIENTS: I certify that this patient is under my care and that I had a face-to-face encounter that meets the physician face-to-face encounter requirements with this patient on this date. The encounter with the patient was in whole or in part for the following MEDICAL CONDITION: (primary reason for [place]) MEDICAL NECESSITY: I certify, that based on my findings, NURSING services are a medically necessary home health service. HOME BOUND STATUS: I certify that my clinical findings support that this patient is homebound  (i.e., Due to illness or injury, pt requires aid of supportive devices such as crutches, cane, wheelchairs, walkers, the use of special transportation or the assistance of another person to leave their place of residence. There is a normal inability to leave the home and doing so requires considerable and taxing effort. Other absences are for medical reasons / religious services and are infrequent or of short duration when for other reasons). o Please direct any NON-WOUND related issues/requests for orders to patient's Primary Care Physician Wound #2 Right,Circumferential Lower Leg o [place] Nurse may visit PRN to address patientos wound care needs. o FACE TO FACE ENCOUNTER: MEDICARE and MEDICAID PATIENTS: I certify that this patient  is under my care and that I had a face-to-face encounter that meets the physician face-to-face encounter requirements with this patient on this date. The encounter with the patient was in whole or in part for the following MEDICAL CONDITION: (primary reason for [place]) MEDICAL NECESSITY: I certify, that based on my findings, NURSING services are a medically necessary home health service. HOME BOUND STATUS: I certify that my clinical findings support that this patient is homebound (i.e., Due to illness or injury, pt requires aid of supportive devices such as crutches, cane, wheelchairs, walkers, the use of special transportation or the assistance of another person to leave their place of residence. There is a normal inability to leave the home and doing so requires considerable and taxing effort. Other absences are for medical reasons / religious services and are infrequent or of short duration when for other reasons). o Please direct any NON-WOUND related issues/requests for orders to patient's Primary Care Physician Electronic Signature(s) Signed: 07/05/2016 3:36:55 PM By: Christin Fudge MD, FACS Signed:  07/05/2016 4:11:07 PM By: Dineen Kid ([id number]) Entered By: Leane Call on 07/05/2016 13:36:24 ZALIYA, PEEPLES ([id number]) -------------------------------------------------------------------------------- Problem List Details Patient Name: TVISHA, DOTZLER. Date of Service: 07/05/2016 12:45 PM Medical Record Number: [id number] Patient Account Number: [id number] Date of Birth/Sex: May 17, 1951 (64 y.o. 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 Date of Service: 07/05/2016 12:45 PM Medical Record Number: [id number] Patient Account Number: [id number] Date of Birth/Sex: 1951/03/02 (64 y.o. Female) Treating RN: Leane Call Primary Care Physician: FITZGERALD, DAVID Other Clinician: Referring Physician: FITZGERALD, DAVID Treating Physician/Extender: Frann Rider in Treatment: 2 Subjective Chief Complaint Information obtained from Patient Patients presents for treatment of an open  diabetic ulcer and significant lymphedema which she's had for about 12 years History of Present Illness (HPI) The following HPI elements were documented for the patient's wound: Location: massive swelling of both lower extremities and ulceration both lower extremities Quality: Patient reports experiencing a dull pain to affected area(s). Severity: Patient states wound are getting worse. Duration: Patient has had the wound for >12 years prior to seeking treatment at the wound center Timing: Pain in wound is constant (hurts all the time) Context: The wound appeared gradually over time Modifying Factors: Other treatment(s) tried include:she has a lymphedema pump but uses it seldom and she's had several course of antibiotics Associated Signs and Symptoms: Patient reports having difficulty standing for long periods. 65 year old patient seen by Dr. Ola Spurr of infectious disease who has been following up for left lower extremity cellulitis and ulcer with recurrent bilateral lower extremity problems for several months. Recently she had a large right lower extremity bullae which opened out and has been ulcerated. She has seen the vascular group and has been getting Unna's wraps and has a  lymphedema pump used in the past. Her prior cultures were positive for Pseudomonas, Proteus and was treated with amoxicillin. He has also been treated with 2 weeks course of ciprofloxacin and amoxicillin.. Increase of Lasix dose helped with the edema and echo showed no systolic CHF but may be diastolic problems. Past medical history significant for diabetes mellitus type 2, venous stasis ulcer, obesity, diabetic peripheral neuropathy, status post back surgery, cholecystectomy, hysterectomy, arthroscopy of the knee. He is a former smoker and quit smoking in 1984. The patient has seen Dr. Delana Meyer who did not recommend any arterial or venous duplex studies and has been using Unna's wraps and also recommended a  lymphedema pump. she has been very noncompliant with using these. 06/28/2016 -- the patient is still on antibiotics as prescribed by Dr. Ola Spurr and he is asked her to take it for 3 weeks. The patient also says she has a lot of redness and pain in the folds of her thigh and lower extremity and this is very painful. SELBY, [id number] ([id number]) Objective Constitutional Pulse regular. Respirations normal and unlabored. Afebrile. Vitals Time Taken: 12:45 PM, Height: 67 in, Source: Stated, Weight: 300 lbs, Source: Stated, BMI: 47, Temperature: 98.4 F, Pulse: 68 bpm, Respiratory Rate: 16 breaths/min, Blood Pressure: 189/53 mmHg. Eyes Nonicteric. Reactive to light. Ears, Nose, Mouth, and Throat Lips, teeth, and gums WNL.Marland Kitchen Moist mucosa without lesions. Neck supple and nontender. No palpable supraclavicular or cervical adenopathy. Normal sized without goiter. Respiratory WNL. No retractions.. Cardiovascular Pedal Pulses WNL. No clubbing, cyanosis or edema. Lymphatic No adneopathy. No adenopathy. No adenopathy. Musculoskeletal Adexa without tenderness or enlargement.. Digits and nails w/o clubbing, cyanosis, infection, petechiae, ischemia, or inflammatory conditions.Marland Kitchen Psychiatric Judgement and insight Intact.. No evidence of depression, anxiety, or agitation.. General Notes: she continues to have significant edema but there is no cellulitis. The ulcerations on both lower extremity is continued to have fixed debris have been able to wash most of it out with abrasive technique with gauze dipped in saline. Integumentary (Hair, Skin) No suspicious lesions. No crepitus or fluctuance. No peri-wound warmth or erythema. No masses.. Wound #1 status is Open. Original cause of wound was Gradually Appeared. The wound is located on the Left,Circumferential Lower Leg. The wound measures 8cm length x 11.5cm width x 0.3cm depth; 72.257cm^2 area and 21.677cm^3 volume. The wound is limited to skin  breakdown. There is no tunneling or undermining noted. There is a large amount of serous drainage noted. The wound margin is flat and intact. There is medium (34-66%) granulation within the wound bed. There is a small (1-33%) amount of MILINDA, HOTTINGER. ([id number]) necrotic tissue within the wound bed including Adherent Slough. The periwound skin appearance exhibited: Localized Edema, Scarring, Maceration, Moist, Hemosiderin Staining. The periwound skin appearance did not exhibit: Callus, Crepitus, Excoriation, Fluctuance, Friable, Induration, Rash, Dry/Scaly, Atrophie Blanche, Cyanosis, Ecchymosis, Mottled, Pallor, Rubor, Erythema. Periwound temperature was noted as No Abnormality. The periwound has tenderness on palpation. Wound #2 status is Open. Original cause of wound was Gradually Appeared. The wound is located on the Right,Circumferential Lower Leg. The wound measures 14cm length x 19cm width x 0.1cm depth; 208.916cm^2 area and 20.892cm^3 volume. The wound is limited to skin breakdown. There is no tunneling or undermining noted. There is a large amount of serous drainage noted. The wound margin is indistinct and nonvisible. There is medium (34-66%) red granulation within the wound bed. There is a medium (34- 66%) amount of necrotic tissue within the wound bed including Adherent Slough. The  periwound skin appearance exhibited: Localized Edema, Scarring, Maceration, Moist, Hemosiderin Staining. The periwound skin appearance did not exhibit: Callus, Crepitus, Excoriation, Fluctuance, Friable, Induration, Rash, Dry/Scaly, Atrophie Blanche, Cyanosis, Ecchymosis, Mottled, Pallor, Rubor, Erythema. Periwound temperature was noted as No Abnormality. The periwound has tenderness on palpation. 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 Wound #2 Right,Circumferential Lower Leg: May shower with protection. Primary Wound Dressing: Wound #1 Left,Circumferential Lower Leg: Aquacel Ag Wound #2 Right,Circumferential Lower Leg: Aquacel Ag Secondary Dressing: TIAHNA, SHIERS ([id number]) Wound #1 Left,Circumferential Lower Leg: ABD pad Dry Gauze Wound #2 Right,Circumferential Lower Leg: ABD pad Dry Gauze Dressing Change Frequency: Wound #1 Left,Circumferential Lower Leg: Change Dressing Monday, Wednesday, Friday Wound #2 Right,Circumferential Lower Leg: Change Dressing Monday, Wednesday, Friday Follow-up Appointments: Wound #1 Left,Circumferential Lower Leg: Return Appointment in 1 week. Wound #2 Right,Circumferential Lower Leg: Return Appointment in 1 week. Edema Control: Wound #1 Left,Circumferential Lower Leg: Other: - profore lite, continue using lymphedema pumps twice daily Wound #2 Right,Circumferential Lower Leg: Other: - profore lite, continue using lymphedema pumps twice daily Home Health: Wound #1 Left,Circumferential Lower Leg: [place] Nurse may visit PRN to address patient s wound care needs. FACE TO FACE ENCOUNTER: MEDICARE and MEDICAID PATIENTS: I certify that this patient is under my care and that I had a face-to-face encounter that meets the physician face-to-face encounter requirements with this patient on this date. The encounter with the patient was in whole or in part for the following MEDICAL CONDITION: (primary reason for [place]) MEDICAL NECESSITY: I certify, that based on my findings, NURSING services are a medically necessary home health service. HOME BOUND STATUS: I certify that my  clinical findings support that this patient is homebound (i.e., Due to illness or injury, pt requires aid of supportive devices such as crutches, cane, wheelchairs, walkers, the use of special transportation or the assistance of another person to leave their place of residence. There is a normal inability to leave the home and doing so requires considerable and taxing effort. Other absences are for medical reasons / religious services and are infrequent or of short duration when for other reasons). Please direct any NON-WOUND related issues/requests for orders to patient's Primary Care Physician Wound #2 Right,Circumferential Lower Leg: [place] Nurse may visit PRN to address patient s wound care needs. FACE TO FACE ENCOUNTER: MEDICARE and MEDICAID PATIENTS: I certify that this patient is under my care and that I had a face-to-face encounter that meets the physician face-to-face encounter requirements with this patient on this date. The encounter with the patient was in whole or in part for the following MEDICAL CONDITION: (primary reason for [place]) MEDICAL NECESSITY: I certify, that based on my findings, NURSING services are a medically necessary home health service. HOME BOUND STATUS: I certify that my clinical findings support that this patient is homebound (i.e., Due to illness or injury, pt requires aid of supportive devices such as crutches, cane, wheelchairs, walkers, the use of special transportation or the assistance of another person to leave their place of residence. There is a normal  inability to leave the home and doing so requires considerable and taxing effort. Other absences are for medical reasons / religious services and are infrequent or of short duration when for other reasons). Please direct any NON-WOUND related issues/requests for orders to patient's Primary Care Physician LYNNAYA, LAPPIN. ([id number]) I recommended: 1. Silver  alginate to the wounds and Profore lite to be applied to both lower extremities and change 3 times a week. 2. recent culture reports were reviewed by Dr. Ola Spurr and he has asked her to take ciprofloxacin and amoxicillin for 3 weeks 3. I have put her on Lotrisone cream to be applied daily to the areas of fungal infection in the popliteal fossa area 4. Elevation and exercise 5. Good control of her diabetes mellitus. 6. Regular visits to the wound center. 7. using a lymphedema pumps for an hour each twice a day I have spent some time discussing with the patient and her husband the importance of compliance and the fact that she has all the equipment but has not been using it appropriately. They both understand and say they will be compliant. Electronic Signature(s) Signed: 07/05/2016 1:40:32 PM By: Christin Fudge MD, FACS Entered By: Christin Fudge on 07/05/2016 13:40:32 [name] ([id number]) -------------------------------------------------------------------------------- SuperBill Details Patient Name: ZHURI, FIXICO. Date of Service: 07/05/2016 Medical Record Number: [id number] Patient Account Number: [id number] Date of Birth/Sex: 01-13-1951 (64 y.o. 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 ICD-10 Description Diagnosis L97.222 Non-pressure chronic ulcer of left calf with fat layer exposed I89.0 Lymphedema, not  elsewhere classified Physician Procedures CPT4 Code: [id number] Description: [id number] - WC PHYS LEVEL 3 - EST PT ICD-10 Description Diagnosis E11.622 Type 2 diabetes mellitus with other skin ulcer I89.0 Lymphedema, not elsewhere classified L97.222 Non-pressure chronic ulcer of left calf with fat l 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 Modifier: ayer exposed layer exposed Quantity: 1 Electronic Signature(s) Signed: 07/05/2016 1:40:58 PM By: Christin Fudge MD, FACS BRAYLEN, DUDAS ([id number]) Entered By: Christin Fudge on 07/05/2016 13:40:57

## 2016-07-09 DIAGNOSIS — I10 Essential (primary) hypertension: Secondary | ICD-10-CM | POA: Diagnosis not present

## 2016-07-09 DIAGNOSIS — L02419 Cutaneous abscess of limb, unspecified: Secondary | ICD-10-CM | POA: Diagnosis not present

## 2016-07-09 DIAGNOSIS — I890 Lymphedema, not elsewhere classified: Secondary | ICD-10-CM | POA: Diagnosis not present

## 2016-07-09 DIAGNOSIS — R809 Proteinuria, unspecified: Secondary | ICD-10-CM | POA: Diagnosis not present

## 2016-07-09 DIAGNOSIS — I83009 Varicose veins of unspecified lower extremity with ulcer of unspecified site: Secondary | ICD-10-CM | POA: Diagnosis not present

## 2016-07-09 DIAGNOSIS — E1129 Type 2 diabetes mellitus with other diabetic kidney complication: Secondary | ICD-10-CM | POA: Diagnosis not present

## 2016-07-09 DIAGNOSIS — L03119 Cellulitis of unspecified part of limb: Secondary | ICD-10-CM | POA: Diagnosis not present

## 2016-07-09 DIAGNOSIS — L97909 Non-pressure chronic ulcer of unspecified part of unspecified lower leg with unspecified severity: Secondary | ICD-10-CM | POA: Diagnosis not present

## 2016-07-09 NOTE — Progress Notes (Signed)
Brooke Hopkins ([id number]) Visit Report for 07/05/2016 Arrival Information Details Patient Name: Brooke Hopkins, Brooke Hopkins. Date of Service: 07/05/2016 12:45 PM Medical Record Number: [id number] Patient Account Number: [id number] Date of Birth/Sex: [birth year] (64 y.o. Female) Treating RN: Macarthur Critchley Primary Care Physician: FITZGERALD, DAVID Other Clinician: Referring Physician: FITZGERALD, DAVID Treating Physician/Extender: Frann Rider in Treatment: 2 Visit Information History Since Last Visit All ordered tests and consults were completed: No Patient Arrived: Wheel Chair Added or deleted any medications: No Arrival Time: 12:47 Any new allergies or adverse reactions: No Accompanied By: son Had a fall or experienced change in No activities of daily living that may affect Transfer Assistance: None risk of falls: Patient Identification Verified: Yes Signs or symptoms of abuse/neglect since last No Secondary Verification Process Yes visito Completed: Hospitalized since last visit: No Patient Requires Transmission-Based No Has Dressing in Place as Prescribed: No Precautions: Has Compression in Place as Prescribed: Yes Patient Has Alerts: No Pain Present Now: No Notes on ASA 81 PO QD compression dressings on but not properly ; has cut off parts of the coban all around and up/down the leg ; at one point, coban being held by a green paperclip Electronic Signature(s) Signed: 07/05/2016 1:18:26 PM By: Leane Call Entered By: Leane Call on 07/05/2016 12:56:43 Brooke Hopkins ([id number]) -------------------------------------------------------------------------------- Clinic Level of Care Assessment Details Patient Name: Brooke Hopkins. Date of Service: 07/05/2016 12:45 PM Medical Record Number: [id number] Patient Account Number: [id number] Date of Birth/Sex: [birth year] (64 y.o. Female) Treating RN: Leane Call Primary Care Physician: FITZGERALD,  DAVID Other Clinician: Referring Physician: FITZGERALD, DAVID Treating Physician/Extender: Frann Rider in Treatment: 2 Clinic Level of Care Assessment Items TOOL 4 Quantity Score []  - Use when only an EandM is performed on FOLLOW-UP visit 0 ASSESSMENTS - Nursing Assessment / Reassessment X - Reassessment of Co-morbidities (includes updates in patient status) 1 10 X - Reassessment of Adherence to Treatment Plan 1 5 ASSESSMENTS - Wound and Skin Assessment / Reassessment []  - Simple Wound Assessment / Reassessment - one wound 0 X - Complex Wound Assessment / Reassessment - multiple wounds 1 5 []  - Dermatologic / Skin Assessment (not related to wound area) 0 ASSESSMENTS - Focused Assessment X - Circumferential Edema Measurements - multi extremities 1 5 X - Nutritional Assessment / Counseling / Intervention 1 10 []  - Lower Extremity Assessment (monofilament, tuning fork, pulses) 0 []  - Peripheral Arterial Disease Assessment (using hand held doppler) 0 ASSESSMENTS - Ostomy and/or Continence Assessment and Care []  - Incontinence Assessment and Management 0 []  - Ostomy Care Assessment and Management (repouching, etc.) 0 PROCESS - Coordination of Care X - Simple Patient / Family Education for ongoing care 1 15 []  - Complex (extensive) Patient / Family Education for ongoing care 0 []  - Staff obtains Programmer, systems, Records, Test Results / Process Orders 0 []  - Staff telephones HHA, Nursing Homes / Clarify orders / etc 0 []  - Routine Transfer to another Facility (non-emergent condition) 0 JAMARRIA, JUBB. ([id number]) []  - Routine Hospital Admission (non-emergent condition) 0 []  - New Admissions / Biomedical engineer / Ordering NPWT, Apligraf, etc. 0 []  - Emergency Hospital Admission (emergent condition) 0 X - Simple Discharge Coordination 1 10 []  - Complex (extensive) Discharge Coordination 0 PROCESS - Special Needs []  - Pediatric / Minor Patient Management 0 []  - Isolation  Patient Management 0 []  - Hearing / Language / Visual special needs 0 []  - Assessment of Community assistance (transportation, D/C planning, etc.) 0 []  - Additional  assistance / Altered mentation 0 []  - Support Surface(s) Assessment (bed, cushion, seat, etc.) 0 INTERVENTIONS - Wound Cleansing / Measurement []  - Simple Wound Cleansing - one wound 0 X - Complex Wound Cleansing - multiple wounds 1 5 []  - Wound Imaging (photographs - any number of wounds) 0 []  - Wound Tracing (instead of photographs) 0 []  - Simple Wound Measurement - one wound 0 X - Complex Wound Measurement - multiple wounds 1 5 INTERVENTIONS - Wound Dressings []  - Small Wound Dressing one or multiple wounds 0 []  - Medium Wound Dressing one or multiple wounds 0 X - Large Wound Dressing one or multiple wounds 2 20 []  - Application of Medications - topical 0 []  - Application of Medications - injection 0 INTERVENTIONS - Miscellaneous []  - External ear exam 0 SIMONETTA, PATIENCE. ([id number]) []  - Specimen Collection (cultures, biopsies, blood, body fluids, etc.) 0 []  - Specimen(s) / Culture(s) sent or taken to Lab for analysis 0 []  - Patient Transfer (multiple staff / Harrel Lemon Lift / Similar devices) 0 []  - Simple Staple / Suture removal (25 or less) 0 []  - Complex Staple / Suture removal (26 or more) 0 []  - Hypo / Hyperglycemic Management (close monitor of Blood Glucose) 0 []  - Ankle / Brachial Index (ABI) - do not check if billed separately 0 []  - Vital Signs 0 Has the patient been seen at the hospital within the last three years: Yes Total Score: 110 Level Of Care: New/Established - Level 3 Electronic Signature(s) Signed: 07/05/2016 4:11:07 PM By: Leane Call Entered By: Leane Call on 07/05/2016 13:37:37 Brooke Hopkins ([id number]) -------------------------------------------------------------------------------- Encounter Discharge Information Details Patient Name: Brooke Hopkins. Date of Service:  07/05/2016 12:45 PM Medical Record Number: [id number] Patient Account Number: [id number] Date of Birth/Sex: October 23, 1951 (64 y.o. Female) Treating RN: Leane Call Primary Care Physician: FITZGERALD, DAVID Other Clinician: Referring Physician: FITZGERALD, DAVID Treating Physician/Extender: Frann Rider in Treatment: 2 Encounter Discharge Information Items Discharge Pain Level: 0 Discharge Condition: Stable Ambulatory Status: Wheelchair Discharge Destination: Home Transportation: Private Auto Accompanied By: son Schedule Follow-up Appointment: No Medication Reconciliation completed No and provided to Patient/Care Alec Mcphee: Patient Clinical Summary of Care: Declined Electronic Signature(s) Signed: 07/05/2016 4:11:07 PM By: Leane Call Previous Signature: 07/05/2016 1:59:37 PM Version By: Ruthine Dose Entered By: Leane Call on 07/05/2016 14:00:22 Brooke Hopkins ([id number]) -------------------------------------------------------------------------------- Lower Extremity Assessment Details Patient Name: JASON, HERBST. Date of Service: 07/05/2016 12:45 PM Medical Record Number: [id number] Patient Account Number: [id number] Date of Birth/Sex: Apr 26, 1951 (64 y.o. Female) Treating RN: Primary Care Physician: FITZGERALD, DAVID Other Clinician: Referring Physician: FITZGERALD, DAVID Treating Physician/Extender: Frann Rider in Treatment: 2 Edema Assessment Assessed: [Left: No] [Right: No] Edema: [Left: Yes] [Right: Yes] Calf Left: Right: Point of Measurement: 32 cm From Medial Instep 55.8 cm 60 cm Ankle Left: Right: Point of Measurement: 11 cm From Medial Instep 34 cm 31.9 cm Vascular Assessment Claudication: Claudication Assessment [Left:None] [Right:None] Pulses: Posterior Tibial Dorsalis Pedis Palpable: [Left:Yes] [Right:Yes] Extremity colors, hair growth, and conditions: Extremity Color: [Left:Hyperpigmented] [Right:Hyperpigmented] Hair  Growth on Extremity: [Left:Yes] [Right:Yes] Temperature of Extremity: [Left:Warm] [Right:Warm] Capillary Refill: [Left:< 3 seconds] [Right:< 3 seconds] Dependent Rubor: [Left:No] [Right:No] Blanched when Elevated: [Left:No] [Right:No] 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 Date of Service: 07/05/2016 12:45 PM Medical Record Number: [id number] Patient Account Number: [id number] Date of Birth/Sex: 05/15/1951 (64 y.o. Female) Treating RN: Leane Call Primary Care Physician: FITZGERALD, DAVID Other Clinician: Referring Physician: FITZGERALD, DAVID Treating Physician/Extender: Frann Rider in Treatment: 2 Vital Signs Height(in): 67 Pulse(bpm): 68 Weight(lbs): 300 Blood Pressure 189/53 (mmHg): Body Mass Index(BMI): 47 Temperature(F): 98.4 Respiratory Rate 16 (breaths/min): Photos: [1:No Photos] [2:No Photos] [N/A:N/A] Wound Location: [1:Left Lower Leg - Circumfernential] [2:Right, Circumferential Lower Leg] [N/A:N/A] Wounding Event: [1:Gradually Appeared] [2:Gradually Appeared] [N/A:N/A] Primary Etiology: [1:Lymphedema] [2:Lymphedema] [N/A:N/A] Date Acquired: [1:12/31/2015] [2:12/31/2015] [N/A:N/A] Weeks of Treatment: [1:2] [2:2] [N/A:N/A] Wound Status: [1:Open] [2:Open] [N/A:N/A] Clustered Wound: [1:No] [2:Yes] [N/A:N/A] Clustered Quantity: [1:N/A] [2:3] [N/A:N/A] Measurements L x W x D 8x11.5x0.3 [2:14x19x0.1] [N/A:N/A] (cm) Area (cm) : [1:72.257] [id number] [N/A:N/A] Volume (cm) : [1:21.677] [2:20.892]  [N/A:N/A] % Reduction in Area: [1:28.10%] [2:7.60%] [N/A:N/A] % Reduction in Volume: -7.80% [2:53.80%] [N/A:N/A] Classification: [1:Partial Thickness] [2:Partial Thickness] [N/A:N/A] Exudate Amount: [1:Large] [2:Large] [N/A:N/A] Exudate Type: [1:Serous] [2:Serous] [N/A:N/A] Exudate Color: [1:amber] [2:amber] [N/A:N/A] Wound Margin: [1:Flat and Intact] [2:Indistinct, nonvisible] [N/A:N/A] Granulation Amount: [1:Medium (34-66%)] [2:Medium (34-66%)] [N/A:N/A] Granulation Quality: [1:N/A] [2:Red] [N/A:N/A] Necrotic Amount: [1:Small (1-33%)] [2:Medium (34-66%)] [N/A:N/A] Exposed Structures: [1:Fascia: No Fat: No Tendon: No Muscle: No Joint: No Bone: No] [2:Fascia: No Fat: No Tendon: No Muscle: No Joint: No Bone: No] [N/A:N/A] Limited to Skin Limited to Skin Breakdown Breakdown Epithelialization: None Small (1-33%) N/A Periwound Skin Texture: Edema: Yes Edema: Yes N/A Scarring: Yes Scarring: Yes Excoriation: No Excoriation: No Induration: No Induration: No Callus: No Callus: No Crepitus: No Crepitus: No Fluctuance: No Fluctuance: No Friable: No Friable: No Rash: No Rash: No Periwound Skin Maceration: Yes Maceration: Yes N/A Moisture: Moist: Yes Moist: Yes Dry/Scaly: No Dry/Scaly: No Periwound Skin Color: Hemosiderin Staining: Yes Hemosiderin Staining: Yes N/A Atrophie Blanche: No Atrophie Blanche: No Cyanosis: No Cyanosis: No Ecchymosis: No Ecchymosis: No Erythema: No Erythema: No Mottled: No Mottled: No Pallor: No Pallor: No Rubor: No Rubor: No Temperature: No Abnormality No Abnormality N/A Tenderness on Yes Yes N/A Palpation: Wound Preparation: Ulcer Cleansing: Other: Ulcer Cleansing: Other: N/A soap and water soap and water Topical Anesthetic Topical Anesthetic Applied: Other: lidocaine Applied: Xylocaine 4% 4% Topical Solution Treatment Notes Electronic Signature(s) Signed: 07/05/2016 4:11:07 PM By: Leane Call Entered By: Leane Call on  07/05/2016 13:34:25 Brooke Hopkins ([id number]) -------------------------------------------------------------------------------- [place] Details Patient Name: CRETA, BARTCH. Date of Service: 07/05/2016 12:45 PM Medical Record Number: [id number] Patient Account Number: [id number] Date of Birth/Sex: 1951/02/27 (64 y.o. Female) Treating RN: Leane Call Primary Care Physician: FITZGERALD, DAVID Other Clinician: Referring Physician: FITZGERALD, DAVID Treating Physician/Extender: Frann Rider in Treatment: 2 Active Inactive Orientation to the Wound Care Program Nursing Diagnoses: Knowledge deficit related to the wound healing center program Goals: Patient/caregiver will verbalize understanding of the [facility] Date Initiated: 06/21/2016 Goal Status: Active Interventions: Provide education on orientation to the wound center Notes: Wound/Skin Impairment Nursing Diagnoses: Impaired tissue integrity Goals: Patient/caregiver will verbalize understanding of skin care regimen Date Initiated: 06/21/2016 Goal Status: Active Ulcer/skin breakdown will have a volume reduction of 30% by week 4 Date Initiated: 06/21/2016 Goal Status: Active Ulcer/skin breakdown will have a volume reduction of 50% by week 8 Date Initiated: 06/21/2016 Goal Status: Active Ulcer/skin breakdown will have a volume reduction of 80% by week 12 Date Initiated: 06/21/2016 Goal Status: Active Ulcer/skin breakdown will heal within 14 weeks Date Initiated: 06/21/2016  Goal Status: Active TORIN, LACKLAND ([id number]) Interventions: Assess patient/caregiver ability to obtain necessary supplies Assess patient/caregiver ability to perform ulcer/skin care regimen upon admission and as needed Assess ulceration(s) every visit Provide education on ulcer and skin care Notes: Electronic Signature(s) Signed: 07/05/2016 4:11:07 PM By: Leane Call Entered By: Leane Call on 07/05/2016 13:34:13 Brooke Hopkins ([id number]) -------------------------------------------------------------------------------- Pain Assessment Details Patient Name: ROSICELA, BAYON. Date of Service: 07/05/2016 12:45 PM Medical Record Number: [id number] Patient Account Number: [id number] Date of Birth/Sex: Feb 15, 1951 (64 y.o. Female) Treating RN: Macarthur Critchley Primary Care Physician: FITZGERALD, DAVID Other Clinician: Referring Physician: FITZGERALD, DAVID Treating Physician/Extender: Frann Rider in Treatment: 2 Active Problems Location of Pain Severity and Description of Pain Patient Has Paino No Site Locations With Dressing Change: No Rate the pain. Current Pain Level: 0 Worst Pain Level: 0 Least Pain Level: 0 Tolerable Pain Level: 5 Pain Management and Medication Current Pain Management: Medication: No Cold Application: No Rest: No Massage: No Activity: No T.E.N.S.: No Heat Application: No Leg drop or elevation: No Is the Current Pain Management Inadequate Adequate: How does your pain impact your activities of daily livingo Sleep: No Bathing: No Appetite: No Relationship With Others: No Bladder Continence: No Emotions: No Bowel Continence: No Work: No Toileting: No Drive: No Dressing: No Hobbies: No Notes pt denies pain this week ; states hurt 2 weeks ago. states some discomfort due to tape on the skin holding the compression dressing up ; tape was actually on the skin and the wrap suspending wrap from skin Electronic Signature(s) SAHEJ, HOLLING ([id number]) Signed: 07/05/2016 1:18:26 PM By: Leane Call Signed: 07/09/2016 4:21:40 PM By: Rebecca Eaton RN, [place] Entered By: Leane Call on 07/05/2016 12:54:14 Brooke Hopkins ([id number]) -------------------------------------------------------------------------------- Patient/Caregiver Education Details Patient Name: KYLEE, ERNZEN. Date of Service: 07/05/2016 12:45  PM Medical Record Number: [id number] Patient Account Number: [id number] Date of Birth/Gender: July 25, 1951 (64 y.o. Female) Treating RN: Leane Call Primary Care Physician: FITZGERALD, DAVID Other Clinician: Referring Physician: FITZGERALD, DAVID Treating Physician/Extender: Frann Rider in Treatment: 2 Education Assessment Education Provided To: Patient and Caregiver son Education Topics Provided Nutrition: Methods: Explain/Verbal Responses: State content correctly Wound/Skin Impairment: Methods: Explain/Verbal Responses: State content correctly Electronic Signature(s) Signed: 07/05/2016 4:11:07 PM By: Leane Call Entered By: Leane Call on 07/05/2016 14:00:48 Brooke Hopkins ([id number]) -------------------------------------------------------------------------------- Wound Assessment Details Patient Name: GARCIA, MEDEMA. Date of Service: 07/05/2016 12:45 PM Medical Record Number: [id number] Patient Account Number: [id number] Date of Birth/Sex: Feb 24, 1951 (64 y.o. Female) Treating RN: Leane Call Primary Care Physician: FITZGERALD, DAVID Other Clinician: Referring Physician: FITZGERALD, DAVID Treating Physician/Extender: Frann Rider in Treatment: 2 Wound Status Wound Number: 1 Primary Etiology: Lymphedema Wound Location: Left Lower Leg - Wound Status: Open Circumfernential Wounding Event: Gradually Appeared Date Acquired: 12/31/2015 Weeks Of Treatment: 2 Clustered Wound: No Photos Photo Uploaded By: Gretta Cool, RN, BSN, Kim on 07/05/2016 16:47:58 Wound Measurements Length: (cm) 8 Width: (cm) 11.5 Depth: (cm) 0.3 Area: (cm) 72.257 Volume: (cm) 21.677 % Reduction in Area: 28.1% % Reduction in Volume: -7.8% Epithelialization: None Tunneling: No Undermining: No Wound Description Classification: Partial Thickness Wound Margin: Flat and Intact Exudate Amount: Large Exudate Type: Serous Exudate Color: amber Foul Odor After  Cleansing: No Wound Bed Granulation Amount: Medium (34-66%) Exposed Structure Necrotic Amount: Small (1-33%) Fascia Exposed: No Necrotic Quality: Adherent Slough Fat Layer Exposed: No DEION, ZUMPANO. ([id number]) Tendon Exposed: No Muscle Exposed: No Joint Exposed: No Bone Exposed: No Limited to Skin Breakdown Periwound Skin Texture Texture  Color No Abnormalities Noted: No No Abnormalities Noted: No Callus: No Atrophie Blanche: No Crepitus: No Cyanosis: No Excoriation: No Ecchymosis: No Fluctuance: No Erythema: No Friable: No Hemosiderin Staining: Yes Induration: No Mottled: No Localized Edema: Yes Pallor: No Rash: No Rubor: No Scarring: Yes Temperature / Pain Moisture Temperature: No Abnormality No Abnormalities Noted: No Tenderness on Palpation: Yes Dry / Scaly: No Maceration: Yes Moist: Yes Wound Preparation Ulcer Cleansing: Other: soap and water, Topical Anesthetic Applied: Other: lidocaine 4%, Treatment Notes Wound #1 (Left, Circumferential Lower Leg) 1. Cleansed with: Clean wound with Normal Saline Other cleanser (specify in notes) 2. Anesthetic Topical Lidocaine 4% cream to wound bed prior to debridement 3. Peri-wound Care: Moisturizing lotion Other peri-wound care (specify in notes) 4. Dressing Applied: Calcium Alginate with Silver 5. Secondary Dressing Applied ABD Pad 7. Secured with 4 Layer Compression System - Bilateral Electronic Signature(s) Signed: 07/05/2016 1:18:26 PM By: Dineen Kid ([id number]) Entered By: Leane Call on 07/05/2016 13:11:52 KARINNA, HANSARD ([id number]) -------------------------------------------------------------------------------- Wound Assessment Details Patient Name: ROMEY, SOBIERAJ. Date of Service: 07/05/2016 12:45 PM Medical Record Number: [id number] Patient Account Number: [id number] Date of Birth/Sex: 08/13/1951 (64 y.o. Female) Treating RN: Leane Call Primary Care  Physician: FITZGERALD, DAVID Other Clinician: Referring Physician: FITZGERALD, DAVID Treating Physician/Extender: Frann Rider in Treatment: 2 Wound Status Wound Number: 2 Primary Etiology: Lymphedema Wound Location: Right, Circumferential Lower Wound Status: Open Leg Wounding Event: Gradually Appeared Date Acquired: 12/31/2015 Weeks Of Treatment: 2 Clustered Wound: Yes Photos Photo Uploaded By: Gretta Cool, RN, BSN, Kim on 07/05/2016 16:48:16 Wound Measurements Length: (cm) 14 Width: (cm) 19 Depth: (cm) 0.1 Clustered Quantity: 3 Area: (cm) 208.916 Volume: (cm) 20.892 % Reduction in Area: 7.6% % Reduction in Volume: 53.8% Epithelialization: Small (1-33%) Tunneling: No Undermining: No Wound Description Classification: Partial Thickness Wound Margin: Indistinct, nonvisible Exudate Amount: Large Exudate Type: Serous Exudate Color: amber Foul Odor After Cleansing: No Wound Bed Granulation Amount: Medium (34-66%) Exposed Structure Granulation Quality: Red Fascia Exposed: No JAELLA, STAMER ([id number]) Necrotic Amount: Medium (34-66%) Fat Layer Exposed: No Necrotic Quality: Adherent Slough Tendon Exposed: No Muscle Exposed: No Joint Exposed: No Bone Exposed: No Limited to Skin Breakdown Periwound Skin Texture Texture Color No Abnormalities Noted: No No Abnormalities Noted: No Callus: No Atrophie Blanche: No Crepitus: No Cyanosis: No Excoriation: No Ecchymosis: No Fluctuance: No Erythema: No Friable: No Hemosiderin Staining: Yes Induration: No Mottled: No Localized Edema: Yes Pallor: No Rash: No Rubor: No Scarring: Yes Temperature / Pain Moisture Temperature: No Abnormality No Abnormalities Noted: No Tenderness on Palpation: Yes Dry / Scaly: No Maceration: Yes Moist: Yes Wound Preparation Ulcer Cleansing: Other: soap and water, Topical Anesthetic Applied: Xylocaine 4% Topical Solution Treatment Notes Wound # () Electronic  Signature(s) Signed: 07/05/2016 4:11:07 PM By: Leane Call Previous Signature: 07/05/2016 1:18:26 PM Version By: Leane Call Entered By: Leane Call on 07/05/2016 13:20:58 Brooke Hopkins ([id number]) -------------------------------------------------------------------------------- Vitals Details Patient Name: HAE, JEWETT. Date of Service: 07/05/2016 12:45 PM Medical Record Number: [id number] Patient Account Number: [id number] Date of Birth/Sex: 06-Jan-1951 (64 y.o. Female) Treating RN: Primary Care Physician: FITZGERALD, DAVID Other Clinician: Referring Physician: FITZGERALD, DAVID Treating Physician/Extender: Frann Rider in Treatment: 2 Vital Signs Time Taken: 12:45 Temperature (F): 98.4 Height (in): 67 Pulse (bpm): 68 Source: Stated Respiratory Rate (breaths/min): 16 Weight (lbs): 300 Blood Pressure (mmHg): 189/53 Source: Stated Reference Range: 80 - 120 mg / dl Body Mass Index (BMI): 47 Electronic Signature(s) Signed: 07/05/2016 1:18:26 PM By: Alben Spittle  By: Leane Call on 07/05/2016 12:54:36

## 2016-07-12 ENCOUNTER — Encounter: Payer: Self-pay | Admitting: General Surgery

## 2016-07-12 ENCOUNTER — Encounter (HOSPITAL_BASED_OUTPATIENT_CLINIC_OR_DEPARTMENT_OTHER): Payer: 59 | Admitting: General Surgery

## 2016-07-12 DIAGNOSIS — E11622 Type 2 diabetes mellitus with other skin ulcer: Secondary | ICD-10-CM | POA: Diagnosis not present

## 2016-07-12 DIAGNOSIS — I87013 Postthrombotic syndrome with ulcer of bilateral lower extremity: Secondary | ICD-10-CM | POA: Diagnosis not present

## 2016-07-12 NOTE — Progress Notes (Addendum)
Brooke Hopkins, Brooke Hopkins ([id number]) Visit Report for 07/12/2016 Arrival Information Details Patient Name: Brooke Hopkins, Brooke Hopkins. Date of Service: 07/12/2016 8:00 AM Medical Record Number: [id number] Patient Account Number: [id number] Date of Birth/Sex: [birth year] (64 y.o. Female) Treating RN: Afful, RN, BSN, [facility] Primary Care Physician: FITZGERALD, DAVID Other Clinician: Referring Physician: FITZGERALD, DAVID Treating Physician/Extender: Benjaman Pott in Treatment: 3 Visit Information History Since Last Visit Added or deleted any medications: No Patient Arrived: Wheel Chair Any new allergies or adverse reactions: No Arrival Time: 08:00 Had a fall or experienced change in No activities of daily living that may affect Accompanied By: son risk of falls: Transfer Assistance: None Signs or symptoms of abuse/neglect since last No Patient Identification Verified: Yes visito Secondary Verification Process Yes Hospitalized since last visit: No Completed: Has Dressing in Place as Prescribed: Yes Patient Requires Transmission-Based No Has Compression in Place as Prescribed: Yes Precautions: Pain Present Now: Yes Patient Has Alerts: No Electronic Signature(s) Signed: 07/12/2016 8:45:07 AM By: Regan Lemming BSN, RN Entered By: Regan Lemming on 07/12/2016 08:45:06 Brooke Hopkins ([id number]) -------------------------------------------------------------------------------- Encounter Discharge Information Details Patient Name: Brooke Hopkins. Date of Service: 07/12/2016 8:00 AM Medical Record Number: [id number] Patient Account Number: [id number] Date of Birth/Sex: 05-19-1951 (64 y.o. Female) Treating RN: Afful, RN, BSN, [facility] Primary Care Physician: FITZGERALD, DAVID Other Clinician: Referring Physician: FITZGERALD, DAVID Treating Physician/Extender: Benjaman Pott in Treatment: 3 Encounter Discharge Information Items Discharge Pain Level: 0 Discharge Condition: Stable Ambulatory  Status: Wheelchair Discharge Destination: Home Private Transportation: Auto Accompanied By: son Schedule Follow-up Appointment: No Medication Reconciliation completed and No provided to Patient/Care Matan Steen: Clinical Summary of Care: Electronic Signature(s) Signed: 07/12/2016 8:54:29 AM By: Judene Companion MD Previous Signature: 07/12/2016 8:53:46 AM Version By: Regan Lemming BSN, RN Entered By: Judene Companion on 07/12/2016 08:54:29 Brooke Hopkins ([id number]) -------------------------------------------------------------------------------- Lower Extremity Assessment Details Patient Name: Brooke Hopkins, Brooke Hopkins. Date of Service: 07/12/2016 8:00 AM Medical Record Number: [id number] Patient Account Number: [id number] Date of Birth/Sex: 30-Nov-1951 (64 y.o. Female) Treating RN: Afful, RN, BSN, [facility] Primary Care Physician: FITZGERALD, DAVID Other Clinician: Referring Physician: FITZGERALD, DAVID Treating Physician/Extender: Judene Companion Weeks in Treatment: 3 Edema Assessment Assessed: [Left: No] [Right: No] E[Left: dema] [Right: :] Calf Left: Right: Point of Measurement: 32 cm From Medial Instep 57 cm 60 cm Ankle Left: Right: Point of Measurement: 11 cm From Medial Instep 33 cm 33 cm Vascular Assessment Claudication: Claudication Assessment [Left:None] [Right:None] Pulses: Posterior Tibial Dorsalis Pedis Palpable: [Left:Yes] [Right:Yes] Extremity colors, hair growth, and conditions: Extremity Color: [Left:Mottled] [Right:Mottled] Hair Growth on Extremity: [Left:Yes] [Right:Yes] Temperature of Extremity: [Left:Warm] [Right:Warm] Capillary Refill: [Left:< 3 seconds] [Right:< 3 seconds] Toe Nail Assessment Left: Right: Thick: Yes Yes Discolored: Yes Yes Deformed: No No Improper Length and Hygiene: Yes Yes Electronic Signature(s) Signed: 07/12/2016 8:47:54 AM By: Regan Lemming BSN, RN Entered By: Regan Lemming on 07/12/2016 08:47:54 Brooke Hopkins ([id number]) Brooke Hopkins, Brooke Hopkins ([id number]) -------------------------------------------------------------------------------- Multi Wound Chart Details Patient Name: Brooke Hopkins, Brooke Hopkins. Date of Service: 07/12/2016 8:00 AM Medical Record Number: [id number] Patient Account Number: [id number] Date of Birth/Sex: 08-25-1951 (64 y.o. Female) Treating RN: Afful, RN, BSN, [facility] Primary Care Physician: FITZGERALD, DAVID Other Clinician: Referring Physician: FITZGERALD, DAVID Treating Physician/Extender: Judene Companion Weeks in Treatment: 3 Vital Signs Height(in): 67 Pulse(bpm): 67 Weight(lbs): 300 Blood Pressure 168/49 (mmHg): Body Mass Index(BMI): 47 Temperature(F): 98.1 Respiratory Rate 20 (breaths/min): Photos: [1:No Photos] [2:No Photos] [N/A:N/A] Wound Location: [1:Left Lower Leg - Circumfernential] [2:Right Lower Leg - Circumfernential] [N/A:N/A] Wounding Event: [  1:Gradually Appeared] [2:Gradually Appeared] [N/A:N/A] Primary Etiology: [1:Lymphedema] [2:Lymphedema] [N/A:N/A] Date Acquired: [1:12/31/2015] [2:12/31/2015] [N/A:N/A] Weeks of Treatment: [1:3] [2:3] [N/A:N/A] Wound Status: [1:Open] [2:Open] [N/A:N/A] Clustered Wound: [1:No] [2:Yes] [N/A:N/A] Clustered Quantity: [1:N/A] [2:3] [N/A:N/A] Measurements L x W x D 5x12x0.2 [2:16x21x0.2] [N/A:N/A] (cm) Area (cm) : [1:47.124] [2:263.894] [N/A:N/A] Volume (cm) : [1:9.425] [2:52.779] [N/A:N/A] % Reduction in Area: [1:53.10%] [2:-16.70%] [N/A:N/A] % Reduction in Volume: 53.10% [2:-16.70%] [N/A:N/A] Classification: [1:Partial Thickness] [2:Partial Thickness] [N/A:N/A] Exudate Amount: [1:Large] [2:Large] [N/A:N/A] Exudate Type: [1:Serous] [2:Serous] [N/A:N/A] Exudate Color: [1:amber] [2:amber] [N/A:N/A] Wound Margin: [1:Flat and Intact] [2:Indistinct, nonvisible] [N/A:N/A] Granulation Amount: [1:Medium (34-66%)] [2:Medium (34-66%)] [N/A:N/A] Granulation Quality: [1:Pink, Pale] [2:Red] [N/A:N/A] Necrotic Amount: [1:Small (1-33%)] [2:Medium (34-66%)]  [N/A:N/A] Exposed Structures: [1:Fascia: No Fat: No Tendon: No Muscle: No Joint: No Bone: No] [2:Fascia: No Fat: No Tendon: No Muscle: No Joint: No Bone: No] [N/A:N/A] Limited to Skin Limited to Skin Breakdown Breakdown Epithelialization: None Small (1-33%) N/A Periwound Skin Texture: Edema: Yes Edema: Yes N/A Scarring: Yes Scarring: Yes Excoriation: No Excoriation: No Induration: No Induration: No Callus: No Callus: No Crepitus: No Crepitus: No Fluctuance: No Fluctuance: No Friable: No Friable: No Rash: No Rash: No Periwound Skin Maceration: Yes Maceration: Yes N/A Moisture: Moist: Yes Moist: Yes Dry/Scaly: No Dry/Scaly: No Periwound Skin Color: Hemosiderin Staining: Yes Hemosiderin Staining: Yes N/A Atrophie Blanche: No Atrophie Blanche: No Cyanosis: No Cyanosis: No Ecchymosis: No Ecchymosis: No Erythema: No Erythema: No Mottled: No Mottled: No Pallor: No Pallor: No Rubor: No Rubor: No Temperature: No Abnormality No Abnormality N/A Tenderness on Yes Yes N/A Palpation: Wound Preparation: Ulcer Cleansing: Other: Ulcer Cleansing: Other: N/A soap and water soap and water Topical Anesthetic Topical Anesthetic Applied: Other: lidocaine Applied: Other: lidocaine 4% 4% Treatment Notes Electronic Signature(s) Signed: 07/12/2016 8:49:24 AM By: Regan Lemming BSN, RN Entered By: Regan Lemming on 07/12/2016 08:49:24 Brooke Hopkins ([id number]) -------------------------------------------------------------------------------- [place] Details Patient Name: Brooke Hopkins, Brooke Hopkins. Date of Service: 07/12/2016 8:00 AM Medical Record Number: [id number] Patient Account Number: [id number] Date of Birth/Sex: Oct 22, 1951 (64 y.o. Female) Treating RN: Afful, RN, BSN, [facility] Primary Care Physician: FITZGERALD, DAVID Other Clinician: Referring Physician: FITZGERALD, DAVID Treating Physician/Extender: Benjaman Pott in Treatment: 3 Active  Inactive Orientation to the Wound Care Program Nursing Diagnoses: Knowledge deficit related to the wound healing center program Goals: Patient/caregiver will verbalize understanding of the [facility] Date Initiated: 06/21/2016 Goal Status: Active Interventions: Provide education on orientation to the wound center Notes: Wound/Skin Impairment Nursing Diagnoses: Impaired tissue integrity Goals: Patient/caregiver will verbalize understanding of skin care regimen Date Initiated: 06/21/2016 Goal Status: Active Ulcer/skin breakdown will have a volume reduction of 30% by week 4 Date Initiated: 06/21/2016 Goal Status: Active Ulcer/skin breakdown will have a volume reduction of 50% by week 8 Date Initiated: 06/21/2016 Goal Status: Active Ulcer/skin breakdown will have a volume reduction of 80% by week 12 Date Initiated: 06/21/2016 Goal Status: Active Ulcer/skin breakdown will heal within 14 weeks Date Initiated: 06/21/2016 Goal Status: Active Brooke Hopkins, Brooke Hopkins ([id number]) Interventions: Assess patient/caregiver ability to obtain necessary supplies Assess patient/caregiver ability to perform ulcer/skin care regimen upon admission and as needed Assess ulceration(s) every visit Provide education on ulcer and skin care Notes: Electronic Signature(s) Signed: 07/12/2016 8:49:17 AM By: Regan Lemming BSN, RN Entered By: Regan Lemming on 07/12/2016 08:49:16 Brooke Hopkins ([id number]) -------------------------------------------------------------------------------- Pain Assessment Details Patient Name: Brooke Hopkins, Brooke Hopkins. Date of Service: 07/12/2016 8:00 AM Medical Record Number: [id number] Patient Account Number: [id number] Date of Birth/Sex: 1951/06/13 (64  y.o. Female) Treating RN: Baruch Gouty, RN, BSN, [facility] Primary Care Physician: FITZGERALD, DAVID Other Clinician: Referring Physician: FITZGERALD, DAVID Treating Physician/Extender: Judene Companion Weeks in Treatment: 3 Active  Problems Location of Pain Severity and Description of Pain Patient Has Paino Yes Site Locations Pain Location: Pain in Ulcers With Dressing Change: Yes Duration of the Pain. Constant / Intermittento Constant Rate the pain. Current Pain Level: 4 Character of Pain Describe the Pain: Burning, Tender Pain Management and Medication Current Pain Management: Rest: Yes Leg drop or elevation: Yes How does your pain impact your activities of daily livingo Sleep: Yes Bathing: Yes Appetite: Yes Relationship With Others: Yes Bladder Continence: Yes Emotions: Yes Bowel Continence: Yes Work: Yes Toileting: Yes Drive: Yes Dressing: Yes Hobbies: Yes Electronic Signature(s) Signed: 07/12/2016 8:46:07 AM By: Regan Lemming BSN, RN Entered By: Regan Lemming on 07/12/2016 08:46:07 Brooke Hopkins ([id number]) -------------------------------------------------------------------------------- Patient/Caregiver Education Details Patient Name: Brooke Hopkins, Brooke Hopkins. Date of Service: 07/12/2016 8:00 AM Medical Record Number: [id number] Patient Account Number: [id number] Date of Birth/Gender: [birth year] (64 y.o. Female) Treating RN: Afful, RN, BSN, [facility] Primary Care Physician: FITZGERALD, DAVID Other Clinician: Referring Physician: FITZGERALD, DAVID Treating Physician/Extender: Benjaman Pott in Treatment: 3 Education Assessment Education Provided To: Patient and Caregiver Education Topics Provided Welcome To [facility] [place]: Methods: Explain/Verbal Responses: State content correctly Wound/Skin Impairment: Methods: Explain/Verbal Responses: State content correctly Electronic Signature(s) Signed: 07/12/2016 4:57:40 PM By: Judene Companion MD Entered By: Judene Companion on 07/12/2016 08:54:36 Brooke Hopkins ([id number]) -------------------------------------------------------------------------------- Wound Assessment Details Patient Name: Brooke Hopkins, Brooke Hopkins. Date of Service: 07/12/2016  8:00 AM Medical Record Number: [id number] Patient Account Number: [id number] Date of Birth/Sex: 1951/03/17 (64 y.o. Female) Treating RN: Afful, RN, BSN, [facility] Primary Care Physician: FITZGERALD, DAVID Other Clinician: Referring Physician: FITZGERALD, DAVID Treating Physician/Extender: Judene Companion Weeks in Treatment: 3 Wound Status Wound Number: 1 Primary Etiology: Lymphedema Wound Location: Left Lower Leg - Wound Status: Open Circumfernential Wounding Event: Gradually Appeared Date Acquired: 12/31/2015 Weeks Of Treatment: 3 Clustered Wound: No Photos Photo Uploaded By: Regan Lemming on 07/12/2016 15:39:58 Wound Measurements Length: (cm) 5 Width: (cm) 12 Depth: (cm) 0.2 Area: (cm) 47.124 Volume: (cm) 9.425 % Reduction in Area: 53.1% % Reduction in Volume: 53.1% Epithelialization: None Tunneling: No Undermining: No Wound Description Classification: Partial Thickness Wound Margin: Flat and Intact Exudate Amount: Large Exudate Type: Serous Brooke Hopkins, Brooke Hopkins ([id number]) Foul Odor After Cleansing: No Exudate Color: amber Wound Bed Granulation Amount: Medium (34-66%) Exposed Structure Granulation Quality: Pink, Pale Fascia Exposed: No Necrotic Amount: Small (1-33%) Fat Layer Exposed: No Necrotic Quality: Adherent Slough Tendon Exposed: No Muscle Exposed: No Joint Exposed: No Bone Exposed: No Limited to Skin Breakdown Periwound Skin Texture Texture Color No Abnormalities Noted: No No Abnormalities Noted: No Callus: No Atrophie Blanche: No Crepitus: No Cyanosis: No Excoriation: No Ecchymosis: No Fluctuance: No Erythema: No Friable: No Hemosiderin Staining: Yes Induration: No Mottled: No Localized Edema: Yes Pallor: No Rash: No Rubor: No Scarring: Yes Temperature / Pain Moisture Temperature: No Abnormality No Abnormalities Noted: No Tenderness on Palpation: Yes Dry / Scaly: No Maceration: Yes Moist: Yes Wound Preparation Ulcer Cleansing: Other:  soap and water, Topical Anesthetic Applied: Other: lidocaine 4%, Treatment Notes Wound #1 (Left, Circumferential Lower Leg) 1. Cleansed with: Clean wound with Normal Saline Other cleanser (specify in notes) 2. Anesthetic Topical Lidocaine 4% cream to wound bed prior to debridement 3. Peri-wound Care: Moisturizing lotion Other peri-wound care (specify in notes) 4. Dressing Applied: Calcium Alginate with Silver Haughn, Neela M. (  [id number]) 5. Secondary Dressing Applied ABD Pad 7. Secured with 3 Layer Compression System - Bilateral Electronic Signature(s) Signed: 07/12/2016 3:42:51 PM By: Regan Lemming BSN, RN Entered By: Regan Lemming on 07/12/2016 08:48:42 Brooke Hopkins ([id number]) -------------------------------------------------------------------------------- Wound Assessment Details Patient Name: Brooke Hopkins, HARER. Date of Service: 07/12/2016 8:00 AM Medical Record Number: [id number] Patient Account Number: [id number] Date of Birth/Sex: [birth year] (64 y.o. Female) Treating RN: Afful, RN, BSN, [facility] Primary Care Physician: FITZGERALD, DAVID Other Clinician: Referring Physician: FITZGERALD, DAVID Treating Physician/Extender: Judene Companion Weeks in Treatment: 3 Wound Status Wound Number: 2 Primary Etiology: Lymphedema Wound Location: Right Lower Leg - Wound Status: Open Circumfernential Wounding Event: Gradually Appeared Date Acquired: 12/31/2015 Weeks Of Treatment: 3 Clustered Wound: Yes Photos Photo Uploaded By: Regan Lemming on 07/12/2016 15:39:58 Wound Measurements Length: (cm) 16 Width: (cm) 21 Depth: (cm) 0.2 Clustered Quantity: 3 Area: (cm) 263.894 Volume: (cm) 52.779 % Reduction in Area: -16.7% % Reduction in Volume: -16.7% Epithelialization: Small (1-33%) Tunneling: No Undermining: No Wound Description Classification: Partial Thickness Wound Margin: Indistinct, nonvisible Exudate Amount: Large BURNIS, BRESSETTE ([id number]) Foul Odor After  Cleansing: No Exudate Type: Serous Exudate Color: amber Wound Bed Granulation Amount: Medium (34-66%) Exposed Structure Granulation Quality: Red Fascia Exposed: No Necrotic Amount: Medium (34-66%) Fat Layer Exposed: No Necrotic Quality: Adherent Slough Tendon Exposed: No Muscle Exposed: No Joint Exposed: No Bone Exposed: No Limited to Skin Breakdown Periwound Skin Texture Texture Color No Abnormalities Noted: No No Abnormalities Noted: No Callus: No Atrophie Blanche: No Crepitus: No Cyanosis: No Excoriation: No Ecchymosis: No Fluctuance: No Erythema: No Friable: No Hemosiderin Staining: Yes Induration: No Mottled: No Localized Edema: Yes Pallor: No Rash: No Rubor: No Scarring: Yes Temperature / Pain Moisture Temperature: No Abnormality No Abnormalities Noted: No Tenderness on Palpation: Yes Dry / Scaly: No Maceration: Yes Moist: Yes Wound Preparation Ulcer Cleansing: Other: soap and water, Topical Anesthetic Applied: Other: lidocaine 4%, Treatment Notes Wound #2 (Right, Circumferential Lower Leg) 1. Cleansed with: Clean wound with Normal Saline Other cleanser (specify in notes) 2. Anesthetic Topical Lidocaine 4% cream to wound bed prior to debridement 3. Peri-wound Care: Moisturizing lotion Other peri-wound care (specify in notes) 4. Dressing Applied: CHARLES, SHARER ([id number]) Calcium Alginate with Silver 5. Secondary Dressing Applied ABD Pad 7. Secured with 3 Layer Compression System - Bilateral Electronic Signature(s) Signed: 07/12/2016 3:42:51 PM By: Regan Lemming BSN, RN Entered By: Regan Lemming on 07/12/2016 08:49:11 Brooke Hopkins ([id number]) -------------------------------------------------------------------------------- Vitals Details Patient Name: HATHAWAY, HOTZ. Date of Service: 07/12/2016 8:00 AM Medical Record Number: [id number] Patient Account Number: [id number] Date of Birth/Sex: Apr 13, 1951 (64 y.o. Female) Treating RN:  Afful, RN, BSN, [facility] Primary Care Physician: FITZGERALD, DAVID Other Clinician: Referring Physician: FITZGERALD, DAVID Treating Physician/Extender: Judene Companion Weeks in Treatment: 3 Vital Signs Time Taken: 08:02 Temperature (F): 98.1 Height (in): 67 Pulse (bpm): 67 Weight (lbs): 300 Respiratory Rate (breaths/min): 20 Body Mass Index (BMI): 47 Blood Pressure (mmHg): 168/49 Reference Range: 80 - 120 mg / dl Electronic Signature(s) Signed: 07/12/2016 8:46:46 AM By: Regan Lemming BSN, RN Entered By: Regan Lemming on 07/12/2016 08:46:46

## 2016-07-12 NOTE — Progress Notes (Signed)
Lymphedema bilateral with stasis ulcers.. Debrider curette.  UNNA  Boots applied.  Continue pumps at home

## 2016-07-13 NOTE — Progress Notes (Addendum)
FERREL, TURCO ([id number]) Visit Report for 07/12/2016 Chief Complaint Document Details Patient Name: Brooke Hopkins, Brooke Hopkins. Date of Service: 07/12/2016 8:00 AM Medical Record Patient Account Number: [id number] Number: Afful, RN, BSN, Treating RN: [birth year] [phone] y.o. Velva Harman Date of Birth/Sex: Female) Other Clinician: Primary Care Physician: FITZGERALD, DAVID Treating Jerline Pain, Kadesia Robel Referring Physician: FITZGERALD, DAVID Physician/Extender: Suella Grove in Treatment: 3 Information Obtained from: Patient Chief Complaint Patients presents for treatment of an open diabetic ulcer and significant lymphedema which she's had for about 12 years Electronic Signature(s) Signed: 07/12/2016 8:50:50 AM By: Judene Companion MD Entered By: Judene Companion on 07/12/2016 08:50:50 Brooke Hopkins ([id number]) -------------------------------------------------------------------------------- Debridement Details Patient Name: Brooke Hopkins, Brooke Hopkins. Date of Service: 07/12/2016 8:00 AM Medical Record Patient Account Number: [id number] Number: Afful, RN, BSN, Treating RN: 26-May-1951 [phone] y.o. Velva Harman Date of Birth/Sex: Female) Other Clinician: Primary Care Physician: FITZGERALD, DAVID Treating Jerline Pain, Harli Engelken Referring Physician: FITZGERALD, DAVID Physician/Extender: Suella Grove in Treatment: 3 Debridement Performed for Wound #1 Left,Circumferential Lower Leg Assessment: Performed By: Physician Judene Companion, MD Debridement: Debridement Pre-procedure Yes Verification/Time Out Taken: Start Time: 08:15 Pain Control: Lidocaine 4% Topical Solution Level: Skin/Subcutaneous Tissue Total Area Debrided (L x 5 (cm) x 12 (cm) = 60 (cm) W): Tissue and other Non-Viable, Exudate, Fibrin/Slough, Subcutaneous material debrided: Instrument: Curette Bleeding: Minimum Hemostasis Achieved: Pressure End Time: 08:17 Procedural Pain: 0 Post Procedural Pain: 0 Response to Treatment: Procedure was tolerated  well Post Debridement Measurements of Total Wound Length: (cm) 5 Width: (cm) 12 Depth: (cm) 0.2 Volume: (cm) 9.425 Post Procedure Diagnosis Same as Pre-procedure Electronic Signature(s) Signed: 07/12/2016 8:50:23 AM By: Regan Lemming BSN, RN Signed: 07/12/2016 4:57:40 PM By: Judene Companion MD Entered By: Regan Lemming on 07/12/2016 08:50:22 Brooke Hopkins ([id number]) -------------------------------------------------------------------------------- Debridement Details Patient Name: Brooke Hopkins, Brooke Hopkins. Date of Service: 07/12/2016 8:00 AM Medical Record Patient Account Number: [id number] Number: Afful, RN, BSN, Treating RN: 10-Aug-1951 [phone] y.o. Velva Harman Date of Birth/Sex: Female) Other Clinician: Primary Care Physician: FITZGERALD, DAVID Treating Jerline Pain, Mariesa Grieder Referring Physician: FITZGERALD, DAVID Physician/Extender: Suella Grove in Treatment: 3 Debridement Performed for Wound #2 Right,Circumferential Lower Leg Assessment: Performed By: Physician Judene Companion, MD Debridement: Debridement Pre-procedure Yes Verification/Time Out Taken: Start Time: 08:17 Pain Control: Lidocaine 4% Topical Solution Level: Skin/Subcutaneous Tissue Total Area Debrided (L x 16 (cm) x 21 (cm) = 336 (cm) W): Tissue and other Non-Viable, Exudate, Fibrin/Slough, Subcutaneous material debrided: Instrument: Curette Bleeding: Minimum Hemostasis Achieved: Pressure End Time: 08:20 Procedural Pain: 0 Post Procedural Pain: 0 Response to Treatment: Procedure was tolerated well Post Debridement Measurements of Total Wound Length: (cm) 16 Width: (cm) 21 Depth: (cm) 0.2 Volume: (cm) 52.779 Post Procedure Diagnosis Same as Pre-procedure Electronic Signature(s) Signed: 07/12/2016 8:50:54 AM By: Regan Lemming BSN, RN Signed: 07/12/2016 4:57:40 PM By: Judene Companion MD Entered By: Regan Lemming on 07/12/2016 08:50:54 Brooke Hopkins  ([id number]) -------------------------------------------------------------------------------- HPI Details Patient Name: Brooke Hopkins, Brooke Hopkins. Date of Service: 07/12/2016 8:00 AM Medical Record Patient Account Number: [id number] Number: Afful, RN, BSN, Treating RN: 1951/05/06 [phone] y.o. Velva Harman Date of Birth/Sex: Female) Other Clinician: Primary Care Physician: FITZGERALD, DAVID Treating Jerline Pain, Sherisa Gilvin Referring Physician: FITZGERALD, DAVID Physician/Extender: Weeks in Treatment: 3 History of Present Illness Location: massive swelling of both lower extremities and ulceration both lower extremities Quality: Patient reports experiencing a dull pain to affected area(s). Severity: Patient states wound are getting worse. Duration: Patient has had the wound for >12 years prior to seeking treatment at the wound center Timing: Pain in wound is constant (hurts  all the time) Context: The wound appeared gradually over time Modifying Factors: Other treatment(s) tried include:she has a lymphedema pump but uses it seldom and she's had several course of antibiotics Associated Signs and Symptoms: Patient reports having difficulty standing for long periods. HPI Description: 65 year old patient seen by Dr. Ola Spurr of infectious disease who has been following up for left lower extremity cellulitis and ulcer with recurrent bilateral lower extremity problems for several months. Recently she had a large right lower extremity bullae which opened out and has been ulcerated. She has seen the vascular group and has been getting Unna's wraps and has a lymphedema pump used in the past. Her prior cultures were positive for Pseudomonas, Proteus and was treated with amoxicillin. He has also been treated with 2 weeks course of ciprofloxacin and amoxicillin.. Increase of Lasix dose helped with the edema and echo showed no systolic CHF but may be diastolic problems. Past medical history significant for diabetes  mellitus type 2, venous stasis ulcer, obesity, diabetic peripheral neuropathy, status post back surgery, cholecystectomy, hysterectomy, arthroscopy of the knee. He is a former smoker and quit smoking in 1984. The patient has seen Dr. Delana Meyer who did not recommend any arterial or venous duplex studies and has been using Unna's wraps and also recommended a lymphedema pump. she has been very noncompliant with using these. 06/28/2016 -- the patient is still on antibiotics as prescribed by Dr. Ola Spurr and he is asked her to take it for 3 weeks. The patient also says she has a lot of redness and pain in the folds of her thigh and lower extremity and this is very painful. Electronic Signature(s) Signed: 07/12/2016 8:51:07 AM By: Judene Companion MD Entered By: Judene Companion on 07/12/2016 08:51:07 Brooke Hopkins ([id number]) -------------------------------------------------------------------------------- Physical Exam Details Patient Name: Brooke Hopkins, Brooke Hopkins. Date of Service: 07/12/2016 8:00 AM Medical Record Patient Account Number: [id number] Number: Afful, RN, BSN, Treating RN: 03/18/1951 [phone] y.o. Velva Harman Date of Birth/Sex: Female) Other Clinician: Primary Care Physician: FITZGERALD, DAVID Treating Jerline Pain, Mariaisabel Bodiford Referring Physician: FITZGERALD, DAVID Physician/Extender: Suella Grove in Treatment: 3 Electronic Signature(s) Signed: 07/12/2016 8:51:14 AM By: Judene Companion MD Entered By: Judene Companion on 07/12/2016 08:51:14 Brooke Hopkins ([id number]) -------------------------------------------------------------------------------- Physician Orders Details Patient Name: Brooke Hopkins, Brooke Hopkins. Date of Service: 07/12/2016 8:00 AM Medical Record Patient Account Number: [id number] Number: Afful, RN, BSN, Treating RN: 03-06-1951 [phone] y.o. 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 o May shower with protection. Wound #2 Right,Circumferential Lower Leg o Cleanse wound with mild soap and water o May Shower, gently pat wound dry prior to applying new dressing. o May shower with protection. Anesthetic Wound #1 Left,Circumferential Lower Leg o Topical Lidocaine 4% cream applied to wound bed prior to debridement Wound #2 Right,Circumferential Lower Leg o Topical Lidocaine 4% cream applied to wound bed prior to  debridement Primary Wound Dressing Wound #1 Left,Circumferential Lower Leg o Aquacel Ag Wound #2 Right,Circumferential Lower Leg o Aquacel Ag ROBBIN, MONT[place]NA ([id number]) Secondary Dressing Wound #1 Left,Circumferential Lower Leg o ABD pad o Dry Gauze Wound #2 Right,Circumferential Lower Leg o ABD pad o Dry Gauze Dressing Change Frequency Wound #1 Left,Circumferential Lower Leg o Change Dressing Monday, Wednesday, Friday Wound #2 Right,Circumferential Lower Leg o Change Dressing Monday, Wednesday, Friday Follow-up Appointments Wound #1 Left,Circumferential Lower Leg o Return Appointment in 1 week. Wound #2 Right,Circumferential Lower Leg o Return Appointment in 1 week. Edema Control Wound #1 Left,Circumferential Lower Leg o  3 Layer Compression System - Left Lower Extremity Wound #2 Right,Circumferential Lower Leg o 3 Layer Compression System - Right Lower Extremity Home Health Wound #1 Left,Circumferential Lower Leg o Continue Home Health Visits - [place] Nurse may visit PRN to address patientos wound care needs. o FACE TO FACE ENCOUNTER: MEDICARE and MEDICAID PATIENTS: I certify that this patient is under my care and that I had a face-to-face encounter that meets the physician face-to-face encounter requirements with this patient on this date. The encounter with the patient was in whole or in part for the following MEDICAL CONDITION: (primary reason for [place]) MEDICAL NECESSITY: I certify, that based on my findings, NURSING services are a medically necessary home health service. HOME BOUND STATUS: I certify that my clinical findings support that this patient is homebound (i.e., Due to illness or injury, pt requires aid of supportive devices such as crutches, cane, wheelchairs, walkers, the use of special transportation or the assistance of another person to leave their place of residence. There is a normal inability to leave the home and doing so requires considerable and taxing effort. Other absences are for medical reasons / religious services and are infrequent or of short duration when for other reasons). TRIESTE, WOOTERS ([id number]) o Please direct any NON-WOUND related issues/requests for orders to patient's Primary Care Physician Wound #2 Right,Circumferential Lower Leg o [place] Nurse may visit PRN to address patientos wound care needs. o FACE TO FACE ENCOUNTER: MEDICARE and MEDICAID PATIENTS: I certify that this patient is under my care and that I had a face-to-face encounter that meets the physician face-to-face encounter requirements with this patient on this date. The encounter with the patient was in whole or in part  for the following MEDICAL CONDITION: (primary reason for [place]) MEDICAL NECESSITY: I certify, that based on my findings, NURSING services are a medically necessary home health service. HOME BOUND STATUS: I certify that my clinical findings support that this patient is homebound (i.e., Due to illness or injury, pt requires aid of supportive devices such as crutches, cane, wheelchairs, walkers, the use of special transportation or the assistance of another person to leave their place of residence. There is a normal inability to leave the home and doing so requires considerable and taxing effort. Other absences are for medical reasons / religious services and are infrequent or of short duration when for other reasons). o Please direct any NON-WOUND related issues/requests for orders to patient's Primary Care Physician Electronic Signature(s) Signed: 07/12/2016 8:52:53 AM By: Regan Lemming BSN, RN Signed: 07/12/2016 4:57:40 PM By: Judene Companion MD Entered By: Regan Lemming on 07/12/2016 08:52:52 JOURNEY, LOCH ([id number]) -------------------------------------------------------------------------------- Problem List Details Patient Name: Brooke Hopkins, Brooke Hopkins. Date of Service: 07/12/2016 8:00 AM Medical Record Patient Account Number: [id number] Number: Afful, RN, BSN, Treating RN: 21-May-1951 [phone] y.o.  Velva Harman Date of Birth/Sex: Female) Other Clinician: Primary Care Physician: FITZGERALD, DAVID Treating Jerline Pain, Jennessa Trigo Referring Physician: FITZGERALD, DAVID Physician/Extender: Suella Grove in Treatment: 3 Active Problems ICD-10 Encounter Code Description Active Date Diagnosis E11.622 Type 2 diabetes mellitus with other skin ulcer 06/21/2016 Yes I89.0 Lymphedema, not elsewhere classified 06/21/2016 Yes L97.222 Non-pressure chronic ulcer of left calf with fat layer 06/21/2016 Yes exposed L97.212 Non-pressure chronic ulcer of right calf with fat layer 06/21/2016 Yes exposed E66.01  Morbid (severe) obesity due to excess calories 06/21/2016 Yes B37.2 Candidiasis of skin and nail 06/28/2016 Yes Inactive Problems Resolved Problems Electronic Signature(s) Signed: 07/12/2016 8:50:38 AM By: Judene Companion MD Entered By: Judene Companion on 07/12/2016 08:50:38 Brooke Hopkins ([id number]) -------------------------------------------------------------------------------- Progress Note Details Patient Name: Brooke Hopkins. Date of Service: 07/12/2016 8:00 AM Medical Record Patient Account Number: [id number] Number: Afful, RN, BSN, Treating RN: 30-Oct-1951 [phone] y.o. Velva Harman Date of Birth/Sex: Female) Other Clinician: Primary Care Physician: FITZGERALD, DAVID Treating Jerline Pain, Jillyan Plitt Referring Physician: FITZGERALD, DAVID Physician/Extender: Weeks in Treatment: 3 Subjective Chief Complaint Information obtained from Patient Patients presents for treatment of an open diabetic ulcer and significant lymphedema which she's had for about 12 years History of Present Illness (HPI) The following HPI elements were documented for the patient's wound: Location: massive swelling of both lower extremities and ulceration both lower extremities Quality: Patient reports experiencing a dull pain to affected area(s). Severity: Patient states wound are getting worse. Duration: Patient has had the wound for >12 years prior to seeking treatment at the wound center Timing: Pain in wound is constant (hurts all the time) Context: The wound appeared gradually over time Modifying Factors: Other treatment(s) tried include:she has a lymphedema pump but uses it seldom and she's had several course of antibiotics Associated Signs and Symptoms: Patient reports having difficulty standing for long periods. 65 year old patient seen by Dr. Ola Spurr of infectious disease who has been following up for left lower extremity cellulitis and ulcer with recurrent bilateral lower extremity problems for several  months. Recently she had a large right lower extremity bullae which opened out and has been ulcerated. She has seen the vascular group and has been getting Unna's wraps and has a lymphedema pump used in the past. Her prior cultures were positive for Pseudomonas, Proteus and was treated with amoxicillin. He has also been treated with 2 weeks course of ciprofloxacin and amoxicillin.. Increase of Lasix dose helped with the edema and echo showed no systolic CHF but may be diastolic problems. Past medical history significant for diabetes mellitus type 2, venous stasis ulcer, obesity, diabetic peripheral neuropathy, status post back surgery, cholecystectomy, hysterectomy, arthroscopy of the knee. He is a former smoker and quit smoking in 1984. The patient has seen Dr. Delana Meyer who did not recommend any arterial or venous duplex studies and has been using Unna's wraps and also recommended a lymphedema pump. she has been very noncompliant with using these. 06/28/2016 -- the patient is still on antibiotics as prescribed by Dr. Ola Spurr and he is asked her to take it for 3 weeks. The patient also says she has a lot of redness and pain in the folds of her thigh and lower extremity and this is very painful. Brooke Hopkins, Brooke Hopkins ([id number]) Objective Constitutional Vitals Time Taken: 8:02 AM, Height: 67 in, Weight: 300 lbs, BMI: 47, Temperature: 98.1 F, Pulse: 67 bpm, Respiratory Rate: 20 breaths/min, Blood Pressure: 168/49 mmHg. Integumentary (Hair, Skin) Wound #1 status is Open. Original cause of wound was Gradually Appeared. The wound is located  on the Left,Circumferential Lower Leg. The wound measures 5cm length x 12cm width x 0.2cm depth; 47.124cm^2 area and 9.425cm^3 volume. The wound is limited to skin breakdown. There is no tunneling or undermining noted. There is a large amount of serous drainage noted. The wound margin is flat and intact. There is medium (34-66%) pink, pale granulation within  the wound bed. There is a small (1-33%) amount of necrotic tissue within the wound bed including Adherent Slough. The periwound skin appearance exhibited: Localized Edema, Scarring, Maceration, Moist, Hemosiderin Staining. The periwound skin appearance did not exhibit: Callus, Crepitus, Excoriation, Fluctuance, Friable, Induration, Rash, Dry/Scaly, Atrophie Blanche, Cyanosis, Ecchymosis, Mottled, Pallor, Rubor, Erythema. Periwound temperature was noted as No Abnormality. The periwound has tenderness on palpation. Wound #2 status is Open. Original cause of wound was Gradually Appeared. The wound is located on the Right,Circumferential Lower Leg. The wound measures 16cm length x 21cm width x 0.2cm depth; 263.894cm^2 area and 52.779cm^3 volume. The wound is limited to skin breakdown. There is no tunneling or undermining noted. There is a large amount of serous drainage noted. The wound margin is indistinct and nonvisible. There is medium (34-66%) red granulation within the wound bed. There is a medium (34- 66%) amount of necrotic tissue within the wound bed including Adherent Slough. The periwound skin appearance exhibited: Localized Edema, Scarring, Maceration, Moist, Hemosiderin Staining. The periwound skin appearance did not exhibit: Callus, Crepitus, Excoriation, Fluctuance, Friable, Induration, Rash, Dry/Scaly, Atrophie Blanche, Cyanosis, Ecchymosis, Mottled, Pallor, Rubor, Erythema. Periwound temperature was noted as No Abnormality. The periwound has tenderness on palpation. 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  ([id number]) Procedures Wound #1 Wound #1 is a Lymphedema located on the Left,Circumferential Lower Leg . There was a Skin/Subcutaneous Tissue Debridement [id number]) debridement with total area of 60 sq cm performed by Judene Companion, MD. with the following instrument(s): Curette to remove Non-Viable tissue/material including Exudate, Fibrin/Slough, and Subcutaneous after achieving pain control using Lidocaine 4% Topical Solution. A time out was conducted prior to the start of the procedure. A Minimum amount of bleeding was controlled with Pressure. The procedure was tolerated well with a pain level of 0 throughout and a pain level of 0 following the procedure. Post Debridement Measurements: 5cm length x 12cm width x 0.2cm depth; 9.425cm^3 volume. Post procedure Diagnosis Wound #1: Same as Pre-Procedure Wound #2 Wound #2 is a Lymphedema located on the Right,Circumferential Lower Leg . There was a Skin/Subcutaneous Tissue Debridement [id number]) debridement with total area of 336 sq cm performed by Judene Companion, MD. with the following instrument(s): Curette to remove Non-Viable tissue/material including Exudate, Fibrin/Slough, and Subcutaneous after achieving pain control using Lidocaine 4% Topical Solution. A time out was conducted prior to the start of the procedure. A Minimum amount of bleeding was controlled with Pressure. The procedure was tolerated well with a pain level of 0 throughout and a pain level of 0 following the procedure. Post Debridement Measurements: 16cm length x 21cm width x 0.2cm depth; 52.779cm^3 volume. Post procedure Diagnosis Wound #2: Same as Pre-Procedure Debrided all wound bilateral legs and applied UNNA boots. Continue pumps at home. Alginate on wounds Plan Wound Cleansing: Wound #1 Left,Circumferential Lower Leg: Cleanse  wound with mild soap and water May Shower, gently pat wound dry prior to applying new dressing. May shower with protection. Wound #2  Right,Circumferential Lower Leg: Cleanse wound with mild soap and water May Shower, gently pat wound dry prior to applying new dressing. May shower with protection. Brooke Hopkins, Brooke Hopkins ([id number]) Anesthetic: Wound #1 Left,Circumferential Lower Leg: Topical Lidocaine 4% cream applied to wound bed prior to debridement Wound #2 Right,Circumferential Lower Leg: Topical Lidocaine 4% cream applied to wound bed prior to debridement Primary Wound Dressing: Wound #1 Left,Circumferential Lower Leg: Aquacel Ag Wound #2 Right,Circumferential Lower Leg: Aquacel Ag Secondary Dressing: Wound #1 Left,Circumferential Lower Leg: ABD pad Dry Gauze Wound #2 Right,Circumferential Lower Leg: ABD pad Dry Gauze Dressing Change Frequency: Wound #1 Left,Circumferential Lower Leg: Change Dressing Monday, Wednesday, Friday Wound #2 Right,Circumferential Lower Leg: Change Dressing Monday, Wednesday, Friday Follow-up Appointments: Wound #1 Left,Circumferential Lower Leg: Return Appointment in 1 week. Wound #2 Right,Circumferential Lower Leg: Return Appointment in 1 week. Edema Control: Wound #1 Left,Circumferential Lower Leg: 3 Layer Compression System - Left Lower Extremity Wound #2 Right,Circumferential Lower Leg: 3 Layer Compression System - Right Lower Extremity Home Health: Wound #1 Left,Circumferential Lower Leg: Continue Home Health Visits - [place] Nurse may visit PRN to address patient s wound care needs. FACE TO FACE ENCOUNTER: MEDICARE and MEDICAID PATIENTS: I certify that this patient is under my care and that I had a face-to-face encounter that meets the physician face-to-face encounter requirements with this patient on this date. The encounter with the patient was in whole or in part for the following MEDICAL CONDITION: (primary reason for [place]) MEDICAL NECESSITY: I certify, that based on my findings, NURSING services are a medically necessary home health  service. HOME BOUND STATUS: I certify that my clinical findings support that this patient is homebound (i.e., Due to illness or injury, pt requires aid of supportive devices such as crutches, cane, wheelchairs, walkers, the use of special transportation or the assistance of another person to leave their place of residence. There is a normal inability to leave the home and doing so requires considerable and taxing effort. Other absences are for medical reasons / religious services and are infrequent or of short duration when for other reasons). Please direct any NON-WOUND related issues/requests for orders to patient's Primary Care Physician Wound #2 Right,Circumferential Lower Leg: [place] Nurse may visit PRN to address patient s wound care needs. FACE TO FACE ENCOUNTER: MEDICARE and MEDICAID PATIENTS: I certify that this patient is under Brooke Hopkins, DOHM. ([id number]) my care and that I had a face-to-face encounter that meets the physician face-to-face encounter requirements with this patient on this date. The encounter with the patient was in whole or in part for the following MEDICAL CONDITION: (primary reason for [place]) MEDICAL NECESSITY: I certify, that based on my findings, NURSING services are a medically necessary home health service. HOME BOUND STATUS: I certify that my clinical findings support that this patient is homebound (i.e., Due to illness or injury, pt requires aid of supportive devices such as crutches, cane, wheelchairs, walkers, the use of special transportation or the assistance of another person to leave their place of residence. There is a normal inability to leave the home and doing so requires considerable and taxing effort. Other absences are for medical reasons / religious services and are infrequent or of short duration when for other reasons). Please direct any NON-WOUND related issues/requests for orders to patient's  Primary  Care Physician Electronic Signature(s) Signed: 07/19/2016 11:00:48 AM By: Judene Companion MD Previous Signature: 07/12/2016 8:52:58 AM Version By: Judene Companion MD Entered By: Judene Companion on 07/19/2016 11:00:48 Brooke Hopkins ([id number]) -------------------------------------------------------------------------------- SuperBill Details Patient Name: KERIN, TREMONTI. Date of Service: 07/12/2016 Medical Record Patient Account Number: [id number] Number: Afful, RN, BSN, Treating RN: 1951/02/12 [phone] y.o. 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 E66.01 Morbid (severe) obesity due to excess calories B37.2 Candidiasis of skin and nail Facility Procedures CPT4 Code: [id number] Description: [id number] - DEB SUBQ TISSUE 20 SQ CM/< ICD-10 Description Diagnosis I89.0 Lymphedema, not elsewhere classified Modifier: Quantity: 1 CPT4 Code: [id number] Description: 11045 - DEB SUBQ TISS EA ADDL 20CM ICD-10 Description Diagnosis E66.01 Morbid (severe) obesity due to excess calories Modifier: Quantity: 19 Physician Procedures CPT4 Code: [id number] Description: 99213 - WC PHYS LEVEL 3 - EST PT ICD-10 Description Diagnosis E11.622 Type 2 diabetes mellitus with other skin ulcer Modifier: Quantity: 1 CPT4 Code: [id number] Piontek, LIND Description: 11042 - WC PHYS SUBQ TISS 20 SQ CM ICD-10 Description Diagnosis I89.0 Lymphedema, not elsewhere classified A M. ([id number]) Modifier: Quantity: 1 Electronic Signature(s) Signed: 07/12/2016 8:53:54 AM By:  Judene Companion MD Entered By: Judene Companion on 07/12/2016 08:53:54

## 2016-07-19 ENCOUNTER — Encounter (HOSPITAL_BASED_OUTPATIENT_CLINIC_OR_DEPARTMENT_OTHER): Payer: 59 | Admitting: General Surgery

## 2016-07-19 DIAGNOSIS — I83222 Varicose veins of left lower extremity with both ulcer of calf and inflammation: Secondary | ICD-10-CM

## 2016-07-19 DIAGNOSIS — L97129 Non-pressure chronic ulcer of left thigh with unspecified severity: Secondary | ICD-10-CM

## 2016-07-19 DIAGNOSIS — I83212 Varicose veins of right lower extremity with both ulcer of calf and inflammation: Secondary | ICD-10-CM | POA: Diagnosis not present

## 2016-07-19 DIAGNOSIS — I83221 Varicose veins of left lower extremity with both ulcer of thigh and inflammation: Secondary | ICD-10-CM | POA: Diagnosis not present

## 2016-07-19 DIAGNOSIS — L97219 Non-pressure chronic ulcer of right calf with unspecified severity: Secondary | ICD-10-CM

## 2016-07-19 DIAGNOSIS — L97229 Non-pressure chronic ulcer of left calf with unspecified severity: Principal | ICD-10-CM

## 2016-07-19 DIAGNOSIS — E11622 Type 2 diabetes mellitus with other skin ulcer: Secondary | ICD-10-CM | POA: Diagnosis not present

## 2016-07-19 NOTE — Progress Notes (Signed)
Venous ulcers no change.  Continue compression and lymphedema  pumps

## 2016-07-20 NOTE — Progress Notes (Addendum)
SHABRIA, TILLES ([id number]) Visit Report for 07/19/2016 Arrival Information Details Patient Name: Brooke Hopkins, Brooke Hopkins. Date of Service: 07/19/2016 12:45 PM Medical Record Number: [id number] Patient Account Number: [id number] Date of Birth/Sex: 1951/02/18 (64 y.o. Female) Treating RN: Afful, RN, BSN, [facility] Primary Care Physician: FITZGERALD, DAVID Other Clinician: Referring Physician: FITZGERALD, DAVID Treating Physician/Extender: Benjaman Pott in Treatment: 4 Visit Information History Since Last Visit Added or deleted any medications: No Patient Arrived: Wheel Chair Any new allergies or adverse reactions: No Arrival Time: 12:49 Had a fall or experienced change in No activities of daily living that may affect Accompanied By: son risk of falls: Transfer Assistance: None Signs or symptoms of abuse/neglect since last No Patient Identification Verified: Yes visito Secondary Verification Process Yes Hospitalized since last visit: No Completed: Has Dressing in Place as Prescribed: Yes Patient Requires Transmission-Based No Has Compression in Place as Prescribed: Yes Precautions: Pain Present Now: No Patient Has Alerts: No Electronic Signature(s) Signed: 07/19/2016 2:54:06 PM By: Regan Lemming BSN, RN Entered By: Regan Lemming on 07/19/2016 12:50:13 [name] ([id number]) -------------------------------------------------------------------------------- Encounter Discharge Information Details Patient Name: Brooke Hopkins. Date of Service: 07/19/2016 12:45 PM Medical Record Number: [id number] Patient Account Number: [id number] Date of Birth/Sex: 1951/05/06 (64 y.o. Female) Treating RN: Afful, RN, BSN, [facility] Primary Care Physician: FITZGERALD, DAVID Other Clinician: Referring Physician: FITZGERALD, DAVID Treating Physician/Extender: Benjaman Pott in Treatment: 4 Encounter Discharge Information Items Discharge Pain Level: 0 Discharge Condition: Stable Ambulatory  Status: Wheelchair Discharge Destination: Home Transportation: Private Auto Accompanied By: son Schedule Follow-up Appointment: No Medication Reconciliation completed and provided to Patient/Care No Ophelia Sipe: Provided on Clinical Summary of Care: 07/19/2016 Form Type Recipient Paper Patient LB Electronic Signature(s) Signed: 07/19/2016 2:54:06 PM By: Regan Lemming BSN, RN Previous Signature: 07/19/2016 1:25:49 PM Version By: Ruthine Dose Previous Signature: 07/19/2016 1:18:21 PM Version By: Judene Companion MD Previous Signature: 07/19/2016 11:58:22 AM Version By: Judene Companion MD Entered By: Regan Lemming on 07/19/2016 13:29:12 [name] ([id number]) -------------------------------------------------------------------------------- Lower Extremity Assessment Details Patient Name: Brooke Hopkins. Date of Service: 07/19/2016 12:45 PM Medical Record Number: [id number] Patient Account Number: [id number] Date of Birth/Sex: 1951/12/17 (64 y.o. Female) Treating RN: Afful, RN, BSN, [facility] Primary Care Physician: FITZGERALD, DAVID Other Clinician: Referring Physician: FITZGERALD, DAVID Treating Physician/Extender: Judene Companion Weeks in Treatment: 4 Edema Assessment Assessed: [Left: No] [Right: No] E[Left: dema] [Right: :] Calf Left: Right: Point of Measurement: 32 cm From Medial Instep 57 cm 60 cm Ankle Left: Right: Point of Measurement: 11 cm From Medial Instep 33 cm 33 cm Vascular Assessment Claudication: Claudication Assessment [Left:None] [Right:None] Pulses: Posterior Tibial Dorsalis Pedis Palpable: [Left:Yes] [Right:Yes] Extremity colors, hair growth, and conditions: Extremity Color: [Left:Mottled] [Right:Mottled] Hair Growth on Extremity: [Left:No] [Right:No] Temperature of Extremity: [Left:Warm] [Right:Warm] Capillary Refill: [Left:< 3 seconds] [Right:< 3 seconds] Toe Nail Assessment Left: Right: Thick: Yes Yes Discolored: Yes Yes Deformed: No No Improper Length  and Hygiene: No No Electronic Signature(s) Signed: 07/19/2016 2:54:06 PM By: Regan Lemming BSN, RN Entered By: Regan Lemming on 07/19/2016 12:54:44 [name] ([id number]) [name], [name] ([id number]) -------------------------------------------------------------------------------- Multi Wound Chart Details Patient Name: Brooke Hopkins. Date of Service: 07/19/2016 12:45 PM Medical Record Number: [id number] Patient Account Number: [id number] Date of Birth/Sex: [birth year] (64 y.o. Female) Treating RN: Afful, RN, BSN, [facility] Primary Care Physician: FITZGERALD, DAVID Other Clinician: Referring Physician: FITZGERALD, DAVID Treating Physician/Extender: Benjaman Pott in Treatment: 4 Photos: [1:No Photos] [2:No Photos] [N/A:N/A] Wound Location: [1:Left Lower Leg - Circumfernential] [2:Right Lower  Leg - Circumfernential] [N/A:N/A] Wounding Event: [1:Gradually Appeared] [2:Gradually Appeared] [N/A:N/A] Primary Etiology: [1:Lymphedema] [2:Lymphedema] [N/A:N/A] Date Acquired: [1:12/31/2015] [2:12/31/2015] [N/A:N/A] Weeks of Treatment: [1:4] [2:4] [N/A:N/A] Wound Status: [1:Open] [2:Open] [N/A:N/A] Clustered Wound: [1:No] [2:Yes] [N/A:N/A] Clustered Quantity: [1:N/A] [2:3] [N/A:N/A] Measurements L x W x D 8x10x0.2 [2:20x20x0.2] [N/A:N/A] (cm) Area (cm) : [1:62.832] [2:314.159] [N/A:N/A] Volume (cm) : [1:12.566] [2:62.832] [N/A:N/A] % Reduction in Area: [1:37.50%] [2:-38.90%] [N/A:N/A] % Reduction in Volume: 37.50% [2:-38.90%] [N/A:N/A] Classification: [1:Partial Thickness] [2:Partial Thickness] [N/A:N/A] Exudate Amount: [1:Large] [2:Large] [N/A:N/A] Exudate Type: [1:Serous] [2:Serous] [N/A:N/A] Exudate Color: [1:amber] [2:amber] [N/A:N/A] Wound Margin: [1:Flat and Intact] [2:Indistinct, nonvisible] [N/A:N/A] Granulation Amount: [1:Medium (34-66%)] [2:Medium (34-66%)] [N/A:N/A] Granulation Quality: [1:Pink, Pale] [2:Red] [N/A:N/A] Necrotic Amount: [1:Small (1-33%)] [2:Medium  (34-66%)] [N/A:N/A] Exposed Structures: [1:Fascia: No Fat: No Tendon: No Muscle: No Joint: No Bone: No Limited to Skin Breakdown] [2:Fascia: No Fat: No Tendon: No Muscle: No Joint: No Bone: No Limited to Skin Breakdown] [N/A:N/A] Epithelialization: [1:None] [2:Small (1-33%)] [N/A:N/A] Periwound Skin Texture: Edema: Yes [1:Scarring: Yes Excoriation: No Induration: No Callus: No] [2:Edema: Yes Scarring: Yes Excoriation: No Induration: No Callus: No] [N/A:N/A] Crepitus: No Crepitus: No Fluctuance: No Fluctuance: No Friable: No Friable: No Rash: No Rash: No Periwound Skin Maceration: Yes Maceration: Yes N/A Moisture: Moist: Yes Moist: Yes Dry/Scaly: No Dry/Scaly: No Periwound Skin Color: Hemosiderin Staining: Yes Hemosiderin Staining: Yes N/A Atrophie Blanche: No Atrophie Blanche: No Cyanosis: No Cyanosis: No Ecchymosis: No Ecchymosis: No Erythema: No Erythema: No Mottled: No Mottled: No Pallor: No Pallor: No Rubor: No Rubor: No Temperature: No Abnormality No Abnormality N/A Tenderness on Yes Yes N/A Palpation: Wound Preparation: Ulcer Cleansing: Other: Ulcer Cleansing: Other: N/A soap and water soap and water Topical Anesthetic Topical Anesthetic Applied: Other: lidocaine Applied: Other: lidocaine 4% 4% Treatment Notes Electronic Signature(s) Signed: 07/19/2016 2:54:06 PM By: Regan Lemming BSN, RN Entered By: Regan Lemming on 07/19/2016 13:24:28 [name] ([id number]) -------------------------------------------------------------------------------- [place] Details Patient Name: ESPEN, MIDGLEY. Date of Service: 07/19/2016 12:45 PM Medical Record Number: [id number] Patient Account Number: [id number] Date of Birth/Sex: 02-07-1951 (64 y.o. Female) Treating RN: Afful, RN, BSN, [facility] Primary Care Physician: FITZGERALD, DAVID Other Clinician: Referring Physician: FITZGERALD, DAVID Treating Physician/Extender: Benjaman Pott in Treatment:  4 Active Inactive Orientation to the Wound Care Program Nursing Diagnoses: Knowledge deficit related to the wound healing center program Goals: Patient/caregiver will verbalize understanding of the [facility] Date Initiated: 06/21/2016 Goal Status: Active Interventions: Provide education on orientation to the wound center Notes: Wound/Skin Impairment Nursing Diagnoses: Impaired tissue integrity Goals: Patient/caregiver will verbalize understanding of skin care regimen Date Initiated: 06/21/2016 Goal Status: Active Ulcer/skin breakdown will have a volume reduction of 30% by week 4 Date Initiated: 06/21/2016 Goal Status: Active Ulcer/skin breakdown will have a volume reduction of 50% by week 8 Date Initiated: 06/21/2016 Goal Status: Active Ulcer/skin breakdown will have a volume reduction of 80% by week 12 Date Initiated: 06/21/2016 Goal Status: Active Ulcer/skin breakdown will heal within 14 weeks Date Initiated: 06/21/2016 Goal Status: Active MILEIGH, GALAT ([id number]) Interventions: Assess patient/caregiver ability to obtain necessary supplies Assess patient/caregiver ability to perform ulcer/skin care regimen upon admission and as needed Assess ulceration(s) every visit Provide education on ulcer and skin care Notes: Electronic Signature(s) Signed: 07/19/2016 2:54:06 PM By: Regan Lemming BSN, RN Entered By: Regan Lemming on 07/19/2016 13:09:07 [name] ([id number]) -------------------------------------------------------------------------------- Pain Assessment Details Patient Name: ETOYA, WOLLAN. Date of Service: 07/19/2016 12:45 PM Medical Record Number: [id number] Patient Account Number:  [id number] Date of Birth/Sex: [birth year] (65 y.o. Female) Treating RN: Afful, RN, BSN, [facility] Primary Care Physician: FITZGERALD, DAVID Other Clinician: Referring Physician: FITZGERALD, DAVID Treating Physician/Extender: Benjaman Pott in Treatment:  4 Active Problems Location of Pain Severity and Description of Pain Patient Has Paino No Site Locations With Dressing Change: No Pain Management and Medication Current Pain Management: Electronic Signature(s) Signed: 07/19/2016 2:54:06 PM By: Regan Lemming BSN, RN Entered By: Regan Lemming on 07/19/2016 12:50:20 [name] ([id number]) -------------------------------------------------------------------------------- Patient/Caregiver Education Details Patient Name: ANNTONETTE, KUCHERA. Date of Service: 07/19/2016 12:45 PM Medical Record Number: [id number] Patient Account Number: [id number] Date of Birth/Gender: [birth year] (65 y.o. Female) Treating RN: Afful, RN, BSN, [facility] Primary Care Physician: FITZGERALD, DAVID Other Clinician: Referring Physician: FITZGERALD, DAVID Treating Physician/Extender: Benjaman Pott in Treatment: 4 Education Assessment Education Provided To: Patient Education Topics Provided Electronic Signature(s) Signed: 07/19/2016 3:56:11 PM By: Judene Companion MD Previous Signature: 07/19/2016 11:58:52 AM Version By: Judene Companion MD Entered By: Judene Companion on 07/19/2016 13:18:32 SHAIMA, SAJOUS ([id number]) -------------------------------------------------------------------------------- Wound Assessment Details Patient Name: TORILYNN, SHIPPEE. Date of Service: 07/19/2016 12:45 PM Medical Record Number: [id number] Patient Account Number: [id number] Date of Birth/Sex: 09-24-1951 (64 y.o. Female) Treating RN: Afful, RN, BSN, [facility] Primary Care Physician: FITZGERALD, DAVID Other Clinician: Referring Physician: FITZGERALD, DAVID Treating Physician/Extender: Judene Companion Weeks in Treatment: 4 Wound Status Wound Number: 1 Primary Etiology: Lymphedema Wound Location: Left Lower Leg - Wound Status: Open Circumfernential Wounding Event: Gradually Appeared Date Acquired: 12/31/2015 Weeks Of Treatment: 4 Clustered Wound: No Photos Photo Uploaded By: Regan Lemming on 07/19/2016 14:44:44 Wound Measurements Length: (cm) 8 Width: (cm) 10 Depth: (cm) 0.2 Area: (cm) 62.832 Volume: (cm) 12.566 % Reduction in Area: 37.5% % Reduction in Volume: 37.5% Epithelialization: None Tunneling: No Undermining: No Wound Description Classification: Partial Thickness Wound Margin: Flat and Intact Exudate Amount: Large Exudate Type: Serous Exudate Color: amber Foul Odor After Cleansing: No Wound Bed Granulation Amount: Medium (34-66%) Exposed Structure Granulation Quality: Pink, Pale Fascia Exposed: No Necrotic Amount: Small (1-33%) Fat Layer Exposed: No ESTEFANY, JANTZ ([id number]) Necrotic Quality: Adherent Slough Tendon Exposed: No Muscle Exposed: No Joint Exposed: No Bone Exposed: No Limited to Skin Breakdown Periwound Skin Texture Texture Color No Abnormalities Noted: No No Abnormalities Noted: No Callus: No Atrophie Blanche: No Crepitus: No Cyanosis: No Excoriation: No Ecchymosis: No Fluctuance: No Erythema: No Friable: No Hemosiderin Staining: Yes Induration: No Mottled: No Localized Edema: Yes Pallor: No Rash: No Rubor: No Scarring: Yes Temperature / Pain Moisture Temperature: No Abnormality No Abnormalities Noted: No Tenderness on Palpation: Yes Dry / Scaly: No Maceration: Yes Moist: Yes Wound Preparation Ulcer Cleansing: Other: soap and water, Topical Anesthetic Applied: Other: lidocaine 4%, Treatment Notes Wound #1 (Left, Circumferential Lower Leg) 1. Cleansed with: Clean wound with Normal Saline Other cleanser (specify in notes) 2. Anesthetic Topical Lidocaine 4% cream to wound bed prior to debridement 3. Peri-wound Care: Moisturizing lotion Other peri-wound care (specify in notes) 4. Dressing Applied: Calcium Alginate with Silver 5. Secondary Dressing Applied ABD Pad 7. Secured with 4 Layer Compression System - Bilateral Electronic Signature(s) Signed: 07/19/2016 2:54:06 PM By: Regan Lemming BSN,  RN [address], Mihika M. ([id number]) Entered By: Regan Lemming on 07/19/2016 13:03:48 [name] ([id number]) -------------------------------------------------------------------------------- Wound Assessment Details Patient Name: KATIRIA, ORIOL. Date of Service: 07/19/2016 12:45 PM Medical Record Number: [id number] Patient Account Number: [id number] Date of Birth/Sex: 1951/05/09 (64 y.o. Female) Treating RN: Afful, RN, BSN, [facility] Primary Care Physician: [place], [place]  Other Clinician: Referring Physician: FITZGERALD, DAVID Treating Physician/Extender: Judene Companion Weeks in Treatment: 4 Wound Status Wound Number: 2 Primary Etiology: Lymphedema Wound Location: Right Lower Leg - Wound Status: Open Circumfernential Wounding Event: Gradually Appeared Date Acquired: 12/31/2015 Weeks Of Treatment: 4 Clustered Wound: Yes Photos Photo Uploaded By: Regan Lemming on 07/19/2016 14:45:27 Wound Measurements Length: (cm) 20 Width: (cm) 20 Depth: (cm) 0.2 Clustered Quantity: 3 Area: (cm) 314.159 Volume: (cm) 62.832 % Reduction in Area: -38.9% % Reduction in Volume: -38.9% Epithelialization: Small (1-33%) Tunneling: No Undermining: No Wound Description Classification: Partial Thickness Wound Margin: Indistinct, nonvisible Exudate Amount: Large Exudate Type: Serous Exudate Color: amber Foul Odor After Cleansing: No Wound Bed Granulation Amount: Medium (34-66%) Exposed Structure Granulation Quality: Red Fascia Exposed: No ARRYANA, CAYE ([id number]) Necrotic Amount: Medium (34-66%) Fat Layer Exposed: No Necrotic Quality: Adherent Slough Tendon Exposed: No Muscle Exposed: No Joint Exposed: No Bone Exposed: No Limited to Skin Breakdown Periwound Skin Texture Texture Color No Abnormalities Noted: No No Abnormalities Noted: No Callus: No Atrophie Blanche: No Crepitus: No Cyanosis: No Excoriation: No Ecchymosis: No Fluctuance: No Erythema: No Friable:  No Hemosiderin Staining: Yes Induration: No Mottled: No Localized Edema: Yes Pallor: No Rash: No Rubor: No Scarring: Yes Temperature / Pain Moisture Temperature: No Abnormality No Abnormalities Noted: No Tenderness on Palpation: Yes Dry / Scaly: No Maceration: Yes Moist: Yes Wound Preparation Ulcer Cleansing: Other: soap and water, Topical Anesthetic Applied: Other: lidocaine 4%, Treatment Notes Wound #2 (Right, Circumferential Lower Leg) 1. Cleansed with: Clean wound with Normal Saline Other cleanser (specify in notes) 2. Anesthetic Topical Lidocaine 4% cream to wound bed prior to debridement 3. Peri-wound Care: Moisturizing lotion Other peri-wound care (specify in notes) 4. Dressing Applied: Calcium Alginate with Silver 5. Secondary Dressing Applied ABD Pad 7. Secured with 4 Layer Compression System - Bilateral Electronic Signature(s) KATHEY, SPICER ([id number]) Signed: 07/19/2016 2:54:06 PM By: Regan Lemming BSN, RN Entered By: Regan Lemming on 07/19/2016 13:09:03 [name] ([id number]) -------------------------------------------------------------------------------- Vitals Details Patient Name: ZACHARY, PELISSIER. Date of Service: 07/19/2016 12:45 PM Medical Record Number: [id number] Patient Account Number: [id number] Date of Birth/Sex: Apr 09, 1951 (64 y.o. Female) Treating RN: Afful, RN, BSN, [facility] Primary Care Physician: FITZGERALD, DAVID Other Clinician: Referring Physician: FITZGERALD, DAVID Treating Physician/Extender: Judene Companion Weeks in Treatment: 4 Vital Signs Time Taken: 13:00 Temperature (F): 97.8 Height (in): 67 Pulse (bpm): 69 Weight (lbs): 300 Respiratory Rate (breaths/min): 17 Body Mass Index (BMI): 47 Blood Pressure (mmHg): 160/55 Reference Range: 80 - 120 mg / dl Electronic Signature(s) Signed: 07/19/2016 2:46:34 PM By: Regan Lemming BSN, RN Entered By: Regan Lemming on 07/19/2016 14:46:34

## 2016-07-20 NOTE — Progress Notes (Signed)
Brooke, Hopkins ([id number]) Visit Report for 07/19/2016 Chief Complaint Document Details Patient Name: Brooke Hopkins, Brooke Hopkins 07/19/2016 12:45 Date of Service: PM Medical Record [id number] Number: Patient Account Number: [id number] [birth year] (65 y.o. Treating RN: Brooke Hopkins Date of Birth/Sex: Female) Other Clinician: Primary Care Physician: Brooke Hopkins Treating Brooke Hopkins, Brooke Hopkins Referring Physician: FITZGERALD, Hopkins Physician/Extender: Brooke Hopkins in Treatment: 4 Information Obtained from: Patient Chief Complaint Patients presents for treatment of an open diabetic ulcer and significant lymphedema which she's had for about 12 years Electronic Signature(s) Signed: 07/19/2016 11:51:41 AM By: Judene Companion MD Entered By: Judene Companion on 07/19/2016 11:51:41 Brooke Hopkins ([id number]) -------------------------------------------------------------------------------- Debridement Details Patient Name: Brooke Hopkins, Brooke Hopkins. 07/19/2016 12:45 Date of Service: PM Medical Record [id number] Number: Patient Account Number: [id number] 02-08-1951 (64 y.o. Treating RN: Brooke Hopkins Date of Birth/Sex: Female) Other Clinician: Primary Care Physician: Brooke Hopkins Treating Brooke Hopkins, Kimba [name] Referring Physician: FITZGERALD, Hopkins Physician/Extender: Brooke Hopkins in Treatment: 4 Debridement Performed for Wound #2 Right,Circumferential Lower Leg Assessment: Performed By: Physician Judene Companion, MD Debridement: Debridement Pre-procedure Yes Verification/Time Out Taken: Start Time: 13:10 Hopkins Control: Lidocaine 4% Topical Solution Level: Skin/Subcutaneous Tissue Total Area Debrided (L x 20 (cm) x 20 (cm) = 400 (cm) W): Tissue and other Non-Viable, Fibrin/Slough, Subcutaneous material debrided: Instrument: Curette Bleeding: Minimum Hemostasis Achieved: Pressure End Time: 13:15 Procedural Hopkins: 0 Post Procedural Hopkins: 0 Response to Treatment: Procedure was tolerated  well Post Debridement Measurements of Total Wound Length: (cm) 20 Width: (cm) 20 Depth: (cm) 0.2 Volume: (cm) [id number] Post Procedure Diagnosis Same as Pre-procedure Electronic Signature(s) Signed: 07/19/2016 2:54:06 PM By: Brooke Hopkins BSN, RN Signed: 07/19/2016 3:56:11 PM By: Judene Companion MD Entered By: Brooke Hopkins on 07/19/2016 13:25:20 Brooke Hopkins ([id number]) -------------------------------------------------------------------------------- HPI Details Patient Name: Brooke Hopkins, Brooke Hopkins 07/19/2016 12:45 Date of Service: PM Medical Record [id number] Number: Patient Account Number: [id number] [birth year] (64 y.o. Treating RN: Brooke Hopkins Date of Birth/Sex: Female) Other Clinician: Primary Care Physician: Brooke Hopkins Treating Brooke Hopkins, [name] Referring Physician: FITZGERALD, Hopkins Physician/Extender: Brooke Hopkins in Treatment: 4 History of Present Illness Location: massive swelling of both lower extremities and ulceration both lower extremities Quality: Patient reports experiencing a dull Hopkins to affected area(s). Severity: Patient states wound are getting worse. Duration: Patient has had the wound for >2 years prior to seeking treatment at the wound center Timing: Hopkins in wound is constant (hurts all the time) Context: The wound appeared gradually over time Modifying Factors: Other treatment(s) tried include:she has a lymphedema pump but uses it seldom and she's had several course of antibiotics Associated Signs and Symptoms: Patient reports having difficulty standing for long periods. HPI Description: 65 year old patient seen by Dr. Ola Hopkins of infectious disease who has been following up for left lower extremity cellulitis and ulcer with recurrent bilateral lower extremity problems for several months. Recently she had a large right lower extremity bullae which opened out and has been ulcerated. She has seen the vascular group and has been getting Unna's wraps and  has a lymphedema pump used in the past. Her prior cultures were positive for Pseudomonas, Proteus and was treated with amoxicillin. He has also been treated with 2 Brooke Hopkins course of ciprofloxacin and amoxicillin.. Increase of Lasix dose helped with the edema and echo showed no systolic CHF but may be diastolic problems. Past medical history significant for diabetes mellitus type 2, venous stasis ulcer, obesity, diabetic peripheral neuropathy, status post back surgery, cholecystectomy, hysterectomy, arthroscopy of the knee. He is a former smoker and quit  smoking in 1984. The patient has seen Dr. Delana Hopkins who did not recommend any arterial or venous duplex studies and has been using Unna's wraps and also recommended a lymphedema pump. she has been very noncompliant with using these. 06/28/2016 -- the patient is still on antibiotics as prescribed by Dr. Ola Hopkins and he is asked her to take it for 3 Brooke Hopkins. The patient also says she has a lot of redness and Hopkins in the folds of her thigh and lower extremity and this is very painful. Electronic Signature(s) Signed: 07/19/2016 3:56:11 PM By: Judene Companion MD Previous Signature: 07/19/2016 11:52:12 AM Version By: Judene Companion MD Entered By: Brooke Hopkins on 07/19/2016 13:39:37 Brooke Hopkins ([id number]) -------------------------------------------------------------------------------- Physical Exam Details Patient Name: Brooke Hopkins, Brooke Hopkins 07/19/2016 12:45 Date of Service: PM Medical Record [id number] Number: Patient Account Number: [id number] Sep 20, 1951 (64 y.o. Treating RN: Brooke Hopkins Date of Birth/Sex: Female) Other Clinician: Primary Care Physician: Brooke Hopkins Treating Brooke Hopkins, Wynona Duhamel Referring Physician: FITZGERALD, Hopkins Physician/Extender: Brooke Hopkins in Treatment: 4 Electronic Signature(s) Signed: 07/19/2016 11:52:31 AM By: Judene Companion MD Entered By: Judene Companion on 07/19/2016 11:52:31 Brooke Hopkins, Brooke Hopkins  ([id number]) -------------------------------------------------------------------------------- Physician Orders Details Patient Name: Brooke Hopkins, Brooke Hopkins 07/19/2016 12:45 Date of Service: PM Medical Record [id number] Number: Patient Account Number: [id number] 20-Sep-1951 (64 y.o. 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 o May shower with protection. Wound #2 Right,Circumferential Lower Leg o Cleanse wound with mild soap and water o May Shower, gently pat wound dry prior to applying new dressing. o May shower with protection. Anesthetic Wound #1 Left,Circumferential Lower Leg o Topical Lidocaine 4% cream applied to wound bed prior to debridement Wound #2 Right,Circumferential Lower Leg o Topical Lidocaine 4% cream applied to wound bed prior to debridement Primary Wound Dressing Wound #1 Left,Circumferential Lower Leg o Aquacel Ag Wound #2 Right,Circumferential Lower Leg o Aquacel Ag Brooke Hopkins, Brooke Hopkins ([id number]) Secondary Dressing Wound #1 Left,Circumferential Lower Leg o ABD pad o Dry Gauze Wound #2 Right,Circumferential Lower Leg o ABD pad o  Dry Gauze Dressing Change Frequency Wound #1 Left,Circumferential Lower Leg o Change Dressing Monday, Wednesday, Friday Wound #2 Right,Circumferential Lower Leg o Change Dressing Monday, Wednesday, Friday Follow-up Appointments Wound #1 Left,Circumferential Lower Leg o Return Appointment in 1 week. Wound #2 Right,Circumferential Lower Leg o Return Appointment in 1 week. Edema Control Wound #1 Left,Circumferential Lower Leg o 4-Layer Compression System - Left Lower Extremity Wound #2 Right,Circumferential Lower Leg o 4-Layer Compression System - Right Lower Extremity Home Health Wound #1 Left,Circumferential Lower Leg o Continue Home Health Visits - [place] Nurse may visit PRN to address patientos wound care needs. o FACE TO FACE ENCOUNTER: MEDICARE and MEDICAID PATIENTS: I certify that this patient is under my care and that I had a face-to-face encounter that meets the  physician face-to-face encounter requirements with this patient on this date. The encounter with the patient was in whole or in part for the following MEDICAL CONDITION: (primary reason for [place]) MEDICAL NECESSITY: I certify, that based on my findings, NURSING services are a medically necessary home health service. HOME BOUND STATUS: I certify that my clinical findings support that this patient is homebound (i.e., Due to illness or injury, pt requires aid of supportive devices such as crutches, cane, wheelchairs, walkers, the use of special transportation or the assistance of another person to leave their place of residence. There is a normal inability to leave the home and doing so requires considerable and taxing effort. Other absences are for medical reasons / religious services and are infrequent or of short duration when for other reasons). Brooke Hopkins, Brooke Hopkins ([id number]) o Please direct any NON-WOUND related issues/requests for orders to patient's Primary  Care Physician Wound #2 Right,Circumferential Lower Leg o [place] Nurse may visit PRN to address patientos wound care needs. o FACE TO FACE ENCOUNTER: MEDICARE and MEDICAID PATIENTS: I certify that this patient is under my care and that I had a face-to-face encounter that meets the physician face-to-face encounter requirements with this patient on this date. The encounter with the patient was in whole or in part for the following MEDICAL CONDITION: (primary reason for [place]) MEDICAL NECESSITY: I certify, that based on my findings, NURSING services are a medically necessary home health service. HOME BOUND STATUS: I certify that my clinical findings support that this patient is homebound (i.e., Due to illness or injury, pt requires aid of supportive devices such as crutches, cane, wheelchairs, walkers, the use of special transportation or the assistance of another person to leave their place of residence. There is a normal inability to leave the home and doing so requires considerable and taxing effort. Other absences are for medical reasons / religious services and are infrequent or of short duration when for other reasons). o Please direct any NON-WOUND related issues/requests for orders to patient's Primary Care Physician Electronic Signature(s) Signed: 07/19/2016 2:54:06 PM By: Brooke Hopkins BSN, RN Signed: 07/19/2016 3:56:11 PM By: Judene Companion MD Entered By: Brooke Hopkins on 07/19/2016 13:30:25 Brooke Hopkins, Brooke Hopkins ([id number]) -------------------------------------------------------------------------------- Problem List Details Patient Name: Brooke Hopkins, Brooke Hopkins 07/19/2016 12:45 Date of Service: PM Medical Record [id number] Number: Patient Account Number: [id number] 1951/05/29 (65 y.o. Treating RN: Brooke Hopkins Date of Birth/Sex: Female) Other Clinician: Primary Care Physician: Brooke Hopkins Treating Brooke Hopkins, Casee Knepp Referring  Physician: FITZGERALD, Hopkins Physician/Extender: Brooke Hopkins in Treatment: 4 Active Problems ICD-10 Encounter Code Description Active Date Diagnosis E11.622 Type 2 diabetes mellitus with other skin ulcer 06/21/2016 Yes I89.0 Lymphedema, not elsewhere classified 06/21/2016 Yes L97.222 Non-pressure chronic ulcer of left calf with fat layer 06/21/2016 Yes exposed L97.212 Non-pressure chronic ulcer of right calf with fat layer 06/21/2016 Yes exposed E66.01 Morbid (severe) obesity due to excess calories 06/21/2016 Yes B37.2 Candidiasis of skin and nail 06/28/2016 Yes Inactive Problems Resolved Problems Electronic Signature(s) Signed: 07/19/2016 1:17:14 PM By: Judene Companion MD Previous Signature: 07/19/2016 11:51:03 AM Version By: Judene Companion MD Entered By: Judene Companion on 07/19/2016 13:17:14 Brooke Hopkins ([id number]) -------------------------------------------------------------------------------- Progress Note Details Patient Name: Brooke Hopkins, Brooke Hopkins 07/19/2016 12:45 Date of Service: PM Medical Record [id number] Number: Patient Account Number: [id number] 26-Apr-1951 (64 y.o. Treating RN: Brooke Hopkins Date of Birth/Sex: Female) Other Clinician: Primary Care Physician: Brooke Hopkins Treating Brooke Hopkins, Teejay Meader Referring Physician: FITZGERALD, Hopkins Physician/Extender: Brooke Hopkins  in Treatment: 4 Subjective Chief Complaint Information obtained from Patient Patients presents for treatment of an open diabetic ulcer and significant lymphedema which she's had for about 12 years History of Present Illness (HPI) The following HPI elements were documented for the patient's wound: Location: massive swelling of both lower extremities and ulceration both lower extremities Quality: Patient reports experiencing a dull Hopkins to affected area(s). Severity: Patient states wound are getting worse. Duration: Patient has had the wound for >12 years prior to seeking treatment at the wound  center Timing: Hopkins in wound is constant (hurts all the time) Context: The wound appeared gradually over time Modifying Factors: Other treatment(s) tried include:she has a lymphedema pump but uses it seldom and she's had several course of antibiotics Associated Signs and Symptoms: Patient reports having difficulty standing for long periods. 65 year old patient seen by Dr. Ola Hopkins of infectious disease who has been following up for left lower extremity cellulitis and ulcer with recurrent bilateral lower extremity problems for several months. Recently she had a large right lower extremity bullae which opened out and has been ulcerated. She has seen the vascular group and has been getting Unna's wraps and has a lymphedema pump used in the past. Her prior cultures were positive for Pseudomonas, Proteus and was treated with amoxicillin. He has also been treated with 2 Brooke Hopkins course of ciprofloxacin and amoxicillin.. Increase of Lasix dose helped with the edema and echo showed no systolic CHF but may be diastolic problems. Past medical history significant for diabetes mellitus type 2, venous stasis ulcer, obesity, diabetic peripheral neuropathy, status post back surgery, cholecystectomy, hysterectomy, arthroscopy of the knee. He is a former smoker and quit smoking in 1984. The patient has seen Dr. Delana Hopkins who did not recommend any arterial or venous duplex studies and has been using Unna's wraps and also recommended a lymphedema pump. she has been very noncompliant with using these. 06/28/2016 -- the patient is still on antibiotics as prescribed by Dr. Ola Hopkins and he is asked her to take it for 3 Brooke Hopkins. The patient also says she has a lot of redness and Hopkins in the folds of her thigh and lower extremity and this is very painful. Brooke Hopkins, [id number] ([id number]) Diabetic ulcers with severe lymphedema bil. Rx with UNNA boots Assessment Active Problems ICD-10 E11.622 - Type 2 diabetes mellitus  with other skin ulcer I89.0 - Lymphedema, not elsewhere classified L97.222 - Non-pressure chronic ulcer of left calf with fat layer exposed L97.212 - Non-pressure chronic ulcer of right calf with fat layer exposed E66.01 - Morbid (severe) obesity due to excess calories B37.2 - Candidiasis of skin and nail Electronic Signature(s) Signed: 07/19/2016 11:54:46 AM By: Judene Companion MD Entered By: Judene Companion on 07/19/2016 11:54:46 Brooke Hopkins ([id number]) -------------------------------------------------------------------------------- SuperBill Details Patient Name: Brooke Hopkins. Date of Service: 07/19/2016 Medical Record Patient Account Number: [id number] Number: Afful, RN, BSN, Treating RN: 1951-06-21 [id number].o. 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 E66.01 Morbid (severe) obesity due to excess calories B37.2 Candidiasis of skin and nail Physician Procedures CPT4 Code: [id number] Description: 99213 - WC PHYS LEVEL 3 - EST PT ICD-10 Description Diagnosis E11.622 Type 2  diabetes mellitus with other skin ulcer I89.0 Lymphedema, not elsewhere classified L97.212 Non-pressure chronic ulcer of right calf with fat Modifier: layer exposed Quantity: 1 Electronic Signature(s) Signed: 07/19/2016 1:17:42 PM By: Judene Companion MD Previous Signature: 07/19/2016 11:57:12 AM Version By: Judene Companion MD Entered By: Judene Companion on 07/19/2016 13:17:42

## 2016-07-26 ENCOUNTER — Encounter: Payer: 59 | Admitting: Surgery

## 2016-07-26 DIAGNOSIS — L97811 Non-pressure chronic ulcer of other part of right lower leg limited to breakdown of skin: Secondary | ICD-10-CM | POA: Diagnosis not present

## 2016-07-26 DIAGNOSIS — I890 Lymphedema, not elsewhere classified: Secondary | ICD-10-CM | POA: Diagnosis not present

## 2016-07-26 DIAGNOSIS — L97212 Non-pressure chronic ulcer of right calf with fat layer exposed: Secondary | ICD-10-CM | POA: Diagnosis not present

## 2016-07-26 DIAGNOSIS — L97821 Non-pressure chronic ulcer of other part of left lower leg limited to breakdown of skin: Secondary | ICD-10-CM | POA: Diagnosis not present

## 2016-07-26 DIAGNOSIS — L97222 Non-pressure chronic ulcer of left calf with fat layer exposed: Secondary | ICD-10-CM | POA: Diagnosis not present

## 2016-07-26 DIAGNOSIS — E11622 Type 2 diabetes mellitus with other skin ulcer: Secondary | ICD-10-CM | POA: Diagnosis not present

## 2016-07-27 NOTE — Progress Notes (Signed)
Brooke Hopkins ([id number]) Visit Report for 07/26/2016 Arrival Information Details Patient Name: Brooke Hopkins, Brooke Hopkins. Date of Service: 07/26/2016 10:45 AM Medical Record Number: [id number] Patient Account Number: [id number] Date of Birth/Sex: 1951/02/09 (64 y.o. Female) Treating RN: Brooke Hopkins Primary Care Physician: Brooke Hopkins Other Clinician: Referring Physician: FITZGERALD, Hopkins Treating Physician/Extender: Brooke Hopkins in Treatment: 5 Visit Information History Since Last Visit Added or deleted any medications: No Patient Arrived: Wheel Chair Any new allergies or adverse reactions: No Arrival Time: 11:06 Had a fall or experienced change in No activities of daily living that may affect Accompanied By: son risk of falls: Transfer Assistance: None Signs or symptoms of abuse/neglect since last No Patient Identification Verified: Yes visito Secondary Verification Process Yes Hospitalized since last visit: No Completed: Has Dressing in Place as Prescribed: Yes Patient Requires Transmission-Based No Has Compression in Place as Prescribed: Yes Precautions: Pain Present Now: No Patient Has Alerts: No Electronic Signature(s) Signed: 07/26/2016 4:38:27 PM By: Brooke Cool, RN, Hopkins, Brooke Hopkins Entered By: Brooke Cool, RN, Hopkins, Brooke on 07/26/2016 11:06:52 Brooke Hopkins ([id number]) -------------------------------------------------------------------------------- Encounter Discharge Information Details Patient Name: Brooke Hopkins, Brooke Hopkins. Date of Service: 07/26/2016 10:45 AM Medical Record Number: [id number] Patient Account Number: [id number] Date of Birth/Sex: 21-May-1951 (64 y.o. Female) Treating RN: Brooke Hopkins Primary Care Physician: Brooke Hopkins Other Clinician: Referring Physician: FITZGERALD, Hopkins Treating Physician/Extender: Brooke Hopkins in Treatment: 5 Encounter Discharge Information Items Discharge Pain Level: 3 Discharge Condition: Stable Ambulatory  Status: Wheelchair Discharge Destination: Home Transportation: Private Auto Accompanied By: son Schedule Follow-up Appointment: Yes Medication Reconciliation completed and provided to Patient/Care Yes Brooke Hopkins: Provided on Clinical Summary of Care: 07/26/2016 Form Type Recipient Paper Patient LB Electronic Signature(s) Signed: 07/26/2016 12:14:09 PM By: Brooke Hopkins Entered By: Brooke Hopkins on 07/26/2016 12:14:09 Brooke Hopkins ([id number]) -------------------------------------------------------------------------------- Lower Extremity Assessment Details Patient Name: Brooke Hopkins, Brooke Hopkins. Date of Service: 07/26/2016 10:45 AM Medical Record Number: [id number] Patient Account Number: [id number] Date of Birth/Sex: [birth year] (64 y.o. Female) Treating RN: Brooke Hopkins Primary Care Physician: Brooke Hopkins Other Clinician: Referring Physician: FITZGERALD, Hopkins Treating Physician/Extender: Brooke Hopkins in Treatment: 5 Edema Assessment Assessed: [Left: No] [Right: No] E[Left: dema] [Right: :] Calf Left: Right: Point of Measurement: 32 cm From Medial Instep 50.5 cm 53.5 cm Ankle Left: Right: Point of Measurement: 11 cm From Medial Instep 34 cm 31.2 cm Vascular Assessment Pulses: Posterior Tibial Dorsalis Pedis Palpable: [Left:Yes] [Right:Yes] Extremity colors, hair growth, and conditions: Extremity Color: [Left:Red] [Right:Red] Hair Growth on Extremity: [Left:Yes] [Right:Yes] Temperature of Extremity: [Left:Warm] [Right:Warm] Capillary Refill: [Left:< 3 seconds] [Right:< 3 seconds] Dependent Rubor: [Left:No] [Right:No] Blanched when Elevated: [Left:No] [Right:No] Toe Nail Assessment Left: Right: Thick: Yes Yes Discolored: Yes Yes Deformed: Yes Yes Improper Length and Hygiene: Yes Yes Electronic Signature(s) Signed: 07/26/2016 4:38:27 PM By: Brooke Cool, RN, Hopkins, Brooke Hopkins Entered By: Brooke Cool, RN, Hopkins, Brooke on 07/26/2016 11:15:01 Brooke Hopkins, Brooke Hopkins  ([id number], Brooke Hopkins ([id number]) -------------------------------------------------------------------------------- Multi Wound Chart Details Patient Name: Brooke Hopkins. Date of Service: 07/26/2016 10:45 AM Medical Record Number: [id number] Patient Account Number: [id number] Date of Birth/Sex: June 13, 1951 (64 y.o. Female) Treating RN: Brooke Hopkins Primary Care Physician: Brooke Hopkins Other Clinician: Referring Physician: FITZGERALD, Hopkins Treating Physician/Extender: Brooke Hopkins in Treatment: 5 Vital Signs Height(in): 67 Pulse(bpm): 67 Weight(lbs): 300 Blood Pressure 184/60 (mmHg): Body Mass Index(BMI): 47 Temperature(F): 98.1 Respiratory Rate 20 (breaths/min): Photos: [N/A:N/A] Wound Location: Left Lower Leg - Right Lower Leg - N/A Circumfernential Circumfernential Wounding Event: Gradually Appeared Gradually  Appeared N/A Primary Etiology: Lymphedema Lymphedema N/A Date Acquired: 12/31/2015 12/31/2015 N/A Weeks of Treatment: 5 5 N/A Wound Status: Open Open N/A Clustered Wound: No Yes N/A Brooke Hopkins, Brooke Hopkins. ([id number]) Clustered Quantity: N/A 3 N/A Measurements L x W x D 6x8x0.2 11x18x0.3 N/A (cm) Area (cm) : 37.699 155.509 N/A Volume (cm) : 7.54 46.653 N/A % Reduction in Area: 62.50% 31.30% N/A % Reduction in Volume: 62.50% -3.10% N/A Classification: Partial Thickness Partial Thickness N/A Exudate Amount: Large Large N/A Exudate Type: Serous Serous N/A Exudate Color: amber amber N/A Wound Margin: Flat and Intact Indistinct, nonvisible N/A Granulation Amount: Medium (34-66%) Medium (34-66%) N/A Granulation Quality: Pink, Pale Red N/A Necrotic Amount: Small (1-33%) Medium (34-66%) N/A Exposed Structures: Fascia: No Fascia: No N/A Fat: No Fat: No Tendon: No Tendon: No Muscle: No Muscle: No Joint: No Joint: No Bone: No Bone: No Limited to Skin Limited to Skin Breakdown Breakdown Epithelialization: None Small (1-33%) N/A Periwound  Skin Texture: Edema: Yes Edema: Yes N/A Scarring: Yes Scarring: Yes Excoriation: No Excoriation: No Induration: No Induration: No Callus: No Callus: No Crepitus: No Crepitus: No Fluctuance: No Fluctuance: No Friable: No Friable: No Rash: No Rash: No Periwound Skin Maceration: Yes Maceration: Yes N/A Moisture: Moist: Yes Moist: Yes Dry/Scaly: No Dry/Scaly: No Periwound Skin Color: Hemosiderin Staining: Yes Hemosiderin Staining: Yes N/A Atrophie Blanche: No Atrophie Blanche: No Cyanosis: No Cyanosis: No Ecchymosis: No Ecchymosis: No Erythema: No Erythema: No Mottled: No Mottled: No Pallor: No Pallor: No Rubor: No Rubor: No Temperature: No Abnormality No Abnormality N/A Tenderness on Yes Yes N/A Palpation: Wound Preparation: Ulcer Cleansing: Other: Ulcer Cleansing: Other: N/A soap and water soap and water Brooke Hopkins, Brooke Hopkins ([id number]) Topical Anesthetic Topical Anesthetic Applied: Other: lidocaine Applied: Other: lidocaine 4% 4% Treatment Notes Electronic Signature(s) Signed: 07/26/2016 4:38:27 PM By: Brooke Cool, RN, Hopkins, Brooke Hopkins Entered By: Brooke Cool, RN, Hopkins, Brooke on 07/26/2016 11:28:08 Brooke Hopkins ([id number]) -------------------------------------------------------------------------------- Multi-Disciplinary Care Plan Details Patient Name: Brooke Hopkins, Brooke Hopkins. Date of Service: 07/26/2016 10:45 AM Medical Record Number: [id number] Patient Account Number: [id number] Date of Birth/Sex: 17-Feb-1951 (64 y.o. Female) Treating RN: Brooke Hopkins Primary Care Physician: Brooke Hopkins Other Clinician: Referring Physician: FITZGERALD, Hopkins Treating Physician/Extender: Brooke Hopkins in Treatment: 5 Active Inactive Orientation to the Wound Care Program Nursing Diagnoses: Knowledge deficit related to the wound healing center program Goals: Patient/caregiver will verbalize understanding of the [place] Date Initiated: 06/21/2016 Goal  Status: Active Interventions: Provide education on orientation to the wound center Notes: Wound/Skin Impairment Nursing Diagnoses: Impaired tissue integrity Goals: Patient/caregiver will verbalize understanding of skin care regimen Date Initiated: 06/21/2016 Goal Status: Active Ulcer/skin breakdown will have a volume reduction of 30% by week 4 Date Initiated: 06/21/2016 Goal Status: Active Ulcer/skin breakdown will have a volume reduction of 50% by week 8 Date Initiated: 06/21/2016 Goal Status: Active Ulcer/skin breakdown will have a volume reduction of 80% by week 12 Date Initiated: 06/21/2016 Goal Status: Active Ulcer/skin breakdown will heal within 14 weeks Date Initiated: 06/21/2016 Goal Status: Active Brooke Hopkins, Brooke Hopkins ([id number]) Interventions: Assess patient/caregiver ability to obtain necessary supplies Assess patient/caregiver ability to perform ulcer/skin care regimen upon admission and as needed Assess ulceration(s) every visit Provide education on ulcer and skin care Notes: Electronic Signature(s) Signed: 07/26/2016 4:38:27 PM By: Brooke Cool, RN, Hopkins, Brooke Hopkins Entered By: Brooke Cool, RN, Hopkins, Brooke on 07/26/2016 11:27:51 Brooke Hopkins ([id number]) -------------------------------------------------------------------------------- Pain Assessment Details Patient Name: Brooke Hopkins, Brooke Hopkins. Date of Service: 07/26/2016 10:45 AM Medical Record  Number: [id number] Patient Account Number: [id number] Date of Birth/Sex: October 27, 1951 (64 y.o. Female) Treating RN: Brooke Hopkins Primary Care Physician: Brooke Hopkins Other Clinician: Referring Physician: FITZGERALD, Hopkins Treating Physician/Extender: Brooke Hopkins in Treatment: 5 Active Problems Location of Pain Severity and Description of Pain Patient Has Paino Yes Site Locations Pain Location: Pain in Ulcers With Dressing Change: Yes Duration of the Pain. Constant / Intermittento Constant Rate the pain. Current Pain  Level: 5 Pain Management and Medication Current Pain Management: Goals for Pain Management Topical or injectable lidocaine is offered to patient for acute pain when surgical debridement is performed. If needed, Patient is instructed to use over the counter pain medication for the following 24-48 hours after debridement. Wound care MDs do not prescribed pain medications. Patient has chronic pain or uncontrolled pain. Patient has been instructed to make an appointment with their Primary Care Physician for pain management. Electronic Signature(s) Signed: 07/26/2016 4:38:27 PM By: Brooke Cool, RN, Hopkins, Brooke Hopkins Entered By: Brooke Cool, RN, Hopkins, Brooke on 07/26/2016 11:07:23 Brooke Hopkins ([id number]) -------------------------------------------------------------------------------- Patient/Caregiver Education Details Patient Name: Brooke Hopkins, WALKENHORST. Date of Service: 07/26/2016 10:45 AM Medical Record Number: [id number] Patient Account Number: [id number] Date of Birth/Gender: Mar 26, 1951 (64 y.o. Female) Treating RN: Brooke Hopkins Primary Care Physician: Brooke Hopkins Other Clinician: Referring Physician: FITZGERALD, Hopkins Treating Physician/Extender: Brooke Hopkins in Treatment: 5 Education Assessment Education Provided To: Patient Education Topics Provided Venous: Handouts: Controlling Swelling with Multilayered Compression Wraps Methods: Demonstration, Explain/Verbal Responses: State content correctly Electronic Signature(s) Signed: 07/26/2016 4:38:27 PM By: Brooke Cool, RN, Hopkins, Brooke Hopkins Entered By: Brooke Cool, RN, Hopkins, Brooke on 07/26/2016 11:56:18 Brooke Hopkins ([id number]) -------------------------------------------------------------------------------- Wound Assessment Details Patient Name: CACIA, COVALT. Date of Service: 07/26/2016 10:45 AM Medical Record Number: [id number] Patient Account Number: [id number] Date of Birth/Sex: 04-25-1951 (64 y.o. Female) Treating RN: Brooke Hopkins Primary Care Physician: Brooke Hopkins Other Clinician: Referring Physician: FITZGERALD, Hopkins Treating Physician/Extender: Brooke Hopkins in Treatment: 5 Wound Status Wound Number: 1 Primary Etiology: Lymphedema Wound Location: Left Lower Leg - Wound Status: Open Circumfernential Wounding Event: Gradually Appeared Date Acquired: 12/31/2015 Weeks Of Treatment: 5 Clustered Wound: No Photos Wound Measurements Length: (cm) 6 Width: (cm) 8 Depth: (cm) 0.2 Area: (cm) 37.699 Volume: (cm) 7.54 % Reduction in Area: 62.5% % Reduction in Volume: 62.5% Epithelialization: None Tunneling: No Undermining: No Wound Description Classification: Partial Thickness Wound Margin: Flat and Intact Exudate Amount: Large Exudate Type: Serous Exudate Color: amber Foul Odor After Cleansing: No Wound Bed Granulation Amount: Medium (34-66%) Exposed Structure Granulation Quality: Pink, Pale Fascia Exposed: No Necrotic Amount: Small (1-33%) Fat Layer Exposed: No Necrotic Quality: Adherent Slough Tendon Exposed: No AMELIAROSE, TARANTINO ([id number]) Muscle Exposed: No Joint Exposed: No Bone Exposed: No Limited to Skin Breakdown Periwound Skin Texture Texture Color No Abnormalities Noted: No No Abnormalities Noted: No Callus: No Atrophie Blanche: No Crepitus: No Cyanosis: No Excoriation: No Ecchymosis: No Fluctuance: No Erythema: No Friable: No Hemosiderin Staining: Yes Induration: No Mottled: No Localized Edema: Yes Pallor: No Rash: No Rubor: No Scarring: Yes Temperature / Pain Moisture Temperature: No Abnormality No Abnormalities Noted: No Tenderness on Palpation: Yes Dry / Scaly: No Maceration: Yes Moist: Yes Wound Preparation Ulcer Cleansing: Other: soap and water, Topical Anesthetic Applied: Other: lidocaine 4%, Treatment Notes Wound #1 (Left, Circumferential Lower Leg) 1. Cleansed with: Clean wound with Normal Saline Cleanse wound with antibacterial  soap and water 2. Anesthetic Topical Lidocaine 4% cream to wound bed prior to debridement 3. Peri-wound Care:  Antifungal powder 4. Dressing Applied: Calcium Alginate with Silver 5. Secondary Dressing Applied ABD Pad 7. Secured with 3 Layer Compression System - Bilateral Notes x-sorb Electronic Signature(s) DASHAI, BIESE ([id number]) Signed: 07/26/2016 4:38:27 PM By: Brooke Cool, RN, Hopkins, Brooke Hopkins Entered By: Brooke Cool, RN, Hopkins, Brooke on 07/26/2016 11:23:00 Brooke Hopkins ([id number]) -------------------------------------------------------------------------------- Wound Assessment Details Patient Name: KHLOIE, PHILBROOK. Date of Service: 07/26/2016 10:45 AM Medical Record Number: [id number] Patient Account Number: [id number] Date of Birth/Sex: 1951/07/26 (64 y.o. Female) Treating RN: Brooke Hopkins Primary Care Physician: Brooke Hopkins Other Clinician: Referring Physician: FITZGERALD, Hopkins Treating Physician/Extender: Brooke Hopkins in Treatment: 5 Wound Status Wound Number: 2 Primary Etiology: Lymphedema Wound Location: Right Lower Leg - Wound Status: Open Circumfernential Wounding Event: Gradually Appeared Date Acquired: 12/31/2015 Weeks Of Treatment: 5 Clustered Wound: Yes Photos Wound Measurements Length: (cm) 11 Width: (cm) 18 Depth: (cm) 0.3 Clustered Quantity: 3 Area: (cm) 155.509 Volume: (cm) 46.653 % Reduction in Area: 31.3% % Reduction in Volume: -3.1% Epithelialization: Small (1-33%) Tunneling: No Undermining: No Wound Description Classification: Partial Thickness Wound Margin: Indistinct, nonvisible Exudate Amount: Large Exudate Type: Serous Exudate Color: amber Foul Odor After Cleansing: No Wound Bed Granulation Amount: Medium (34-66%) Exposed Structure Granulation Quality: Red Fascia Exposed: No Necrotic Amount: Medium (34-66%) Fat Layer Exposed: No TAIRRA, BULLIS ([id number]) Necrotic Quality: Adherent Slough Tendon Exposed:  No Muscle Exposed: No Joint Exposed: No Bone Exposed: No Limited to Skin Breakdown Periwound Skin Texture Texture Color No Abnormalities Noted: No No Abnormalities Noted: No Callus: No Atrophie Blanche: No Crepitus: No Cyanosis: No Excoriation: No Ecchymosis: No Fluctuance: No Erythema: No Friable: No Hemosiderin Staining: Yes Induration: No Mottled: No Localized Edema: Yes Pallor: No Rash: No Rubor: No Scarring: Yes Temperature / Pain Moisture Temperature: No Abnormality No Abnormalities Noted: No Tenderness on Palpation: Yes Dry / Scaly: No Maceration: Yes Moist: Yes Wound Preparation Ulcer Cleansing: Other: soap and water, Topical Anesthetic Applied: Other: lidocaine 4%, Treatment Notes Wound #2 (Right, Circumferential Lower Leg) 1. Cleansed with: Clean wound with Normal Saline Cleanse wound with antibacterial soap and water 2. Anesthetic Topical Lidocaine 4% cream to wound bed prior to debridement 3. Peri-wound Care: Antifungal powder 4. Dressing Applied: Calcium Alginate with Silver 5. Secondary Dressing Applied ABD Pad 7. Secured with 3 Layer Compression System - Bilateral Notes x-sorb ZYNASIA, GIECK ([id number]) Electronic Signature(s) Signed: 07/26/2016 4:38:27 PM By: Brooke Cool, RN, Hopkins, Brooke Hopkins Entered By: Brooke Cool, RN, Hopkins, Brooke on 07/26/2016 11:24:31 Brooke Hopkins ([id number]) -------------------------------------------------------------------------------- Vitals Details Patient Name: AHLANA, ROTT. Date of Service: 07/26/2016 10:45 AM Medical Record Number: [id number] Patient Account Number: [id number] Date of Birth/Sex: 09-12-1951 (64 y.o. Female) Treating RN: Brooke Hopkins Primary Care Physician: Brooke Hopkins Other Clinician: Referring Physician: FITZGERALD, Hopkins Treating Physician/Extender: Brooke Hopkins in Treatment: 5 Vital Signs Time Taken: 11:07 Temperature (F): 98.1 Height (in): 67 Pulse (bpm): 67 Weight  (lbs): 300 Respiratory Rate (breaths/min): 20 Body Mass Index (BMI): 47 Blood Pressure (mmHg): 184/60 Reference Range: 80 - 120 mg / dl Electronic Signature(s) Signed: 07/26/2016 4:38:27 PM By: Brooke Cool, RN, Hopkins, Brooke Hopkins Entered By: Brooke Cool, RN, Hopkins, Brooke on 07/26/2016 11:07:44

## 2016-07-27 NOTE — Progress Notes (Signed)
Brooke Hopkins, Brooke Hopkins ([id number]) Visit Report for 07/26/2016 Chief Complaint Document Details Patient Name: Brooke, Hopkins 07/26/2016 10:45 Date of Service: AM Medical Record [id number] Number: Patient Account Number: [id number] [birth year] (65 y.o. Treating RN: [name] Barman Date of Birth/Sex: Female) Other Clinician: Primary Care Physician: FITZGERALD, DAVID Treating Christin Fudge Referring Physician: FITZGERALD, DAVID Physician/Extender: Suella Grove in Treatment: 5 Information Obtained from: Patient Chief Complaint Patients presents for treatment of an open diabetic ulcer and significant lymphedema which she's had for about 12 years Electronic Signature(s) Signed: 07/26/2016 11:54:52 AM By: Christin Fudge MD, FACS Entered By: Christin Fudge on 07/26/2016 11:54:52 [name] ([id number]) -------------------------------------------------------------------------------- Debridement Details Patient Name: Brooke Hopkins, Brooke Hopkins 07/26/2016 10:45 Date of Service: AM Medical Record [id number] Number: Patient Account Number: [id number] [birth year] (65 y.o. Treating RN: [name] Barman Date of Birth/Sex: Female) Other Clinician: Primary Care Physician: FITZGERALD, DAVID Treating Christin Fudge Referring Physician: FITZGERALD, DAVID Physician/Extender: Suella Grove in Treatment: 5 Debridement Performed for Wound #1 Left,Circumferential Lower Leg Assessment: Performed By: Physician Christin Fudge, MD Debridement: Debridement Pre-procedure Yes Verification/Time Out Taken: Start Time: 11:45 Pain Control: Other : lidocaine 4% Level: Skin/Subcutaneous Tissue Total Area Debrided (L x 6 (cm) x 2 (cm) = 12 (cm) W): Tissue and other Viable, Non-Viable, Exudate, Fibrin/Slough, Subcutaneous material debrided: Instrument: Curette Bleeding: Moderate Hemostasis Achieved: Pressure End Time: 11:50 Procedural Pain: 2 Post Procedural Pain: 2 Response to Treatment: Procedure was tolerated well Post  Debridement Measurements of Total Wound Length: (cm) 6 Width: (cm) 2 Depth: (cm) 0.2 Volume: (cm) 1.885 Post Procedure Diagnosis Same as Pre-procedure Electronic Signature(s) Signed: 07/26/2016 11:54:37 AM By: Christin Fudge MD, FACS Signed: 07/26/2016 4:38:27 PM By: Gretta Cool RN, BSN, Kim RN, BSN Entered By: Christin Fudge on 07/26/2016 11:54:36 [name] ([id number]) -------------------------------------------------------------------------------- Debridement Details Patient Name: Brooke Hopkins, Brooke Hopkins 07/26/2016 10:45 Date of Service: AM Medical Record [id number] Number: Patient Account Number: [id number] 1951-03-14 (65 y.o. Treating RN: [name] Barman Date of Birth/Sex: Female) Other Clinician: Primary Care Physician: FITZGERALD, DAVID Treating Christin Fudge Referring Physician: FITZGERALD, DAVID Physician/Extender: Suella Grove in Treatment: 5 Debridement Performed for Wound #2 Right,Circumferential Lower Leg Assessment: Performed By: Physician Christin Fudge, MD Debridement: Debridement Pre-procedure Yes Verification/Time Out Taken: Start Time: 11:45 Pain Control: Other : lidocaine 4% Level: Skin/Subcutaneous Tissue Total Area Debrided (L x 5 (cm) x 5 (cm) = 25 (cm) W): Tissue and other Viable, Non-Viable, Exudate, Fat, Fibrin/Slough, Subcutaneous material debrided: Instrument: Curette Bleeding: Moderate Hemostasis Achieved: Pressure End Time: 11:50 Procedural Pain: 2 Post Procedural Pain: 2 Response to Treatment: Procedure was tolerated well Post Debridement Measurements of Total Wound Length: (cm) 5 Width: (cm) 5 Depth: (cm) 0.3 Volume: (cm) 5.89 Post Procedure Diagnosis Same as Pre-procedure Electronic Signature(s) Signed: 07/26/2016 11:54:44 AM By: Christin Fudge MD, FACS Signed: 07/26/2016 4:38:27 PM By: Gretta Cool RN, BSN, Kim RN, BSN Entered By: Christin Fudge on 07/26/2016 11:54:44 [name]  ([id number]) -------------------------------------------------------------------------------- HPI Details Patient Name: Brooke Hopkins, Brooke Hopkins 07/26/2016 10:45 Date of Service: AM Medical Record [id number] Number: Patient Account Number: [id number] 05/05/1951 (65 y.o. Treating RN: [name] Barman Date of Birth/Sex: Female) Other Clinician: Primary Care Physician: FITZGERALD, DAVID Treating Christin Fudge Referring Physician: FITZGERALD, DAVID Physician/Extender: Weeks in Treatment: 5 History of Present Illness Location: massive swelling of both lower extremities and ulceration both lower extremities Quality: Patient reports experiencing a dull pain to affected area(s). Severity: Patient states wound are getting worse. Duration: Patient has had the wound for >2 years prior to seeking treatment at the wound center Timing: Pain in wound is  constant (hurts all the time) Context: The wound appeared gradually over time Modifying Factors: Other treatment(s) tried include:she has a lymphedema pump but uses it seldom and she's had several course of antibiotics Associated Signs and Symptoms: Patient reports having difficulty standing for long periods. HPI Description: 65 year old patient seen by Dr. Ola Spurr of infectious disease who has been following up for left lower extremity cellulitis and ulcer with recurrent bilateral lower extremity problems for several months. Recently she had a large right lower extremity bullae which opened out and has been ulcerated. She has seen the vascular group and has been getting Unna's wraps and has a lymphedema pump used in the past. Her prior cultures were positive for Pseudomonas, Proteus and was treated with amoxicillin. He has also been treated with 2 weeks course of ciprofloxacin and amoxicillin.. Increase of Lasix dose helped with the edema and echo showed no systolic CHF but may be diastolic problems. Past medical history significant for diabetes mellitus type  2, venous stasis ulcer, obesity, diabetic peripheral neuropathy, status post back surgery, cholecystectomy, hysterectomy, arthroscopy of the knee. He is a former smoker and quit smoking in 1984. The patient has seen Dr. Delana Meyer who did not recommend any arterial or venous duplex studies and has been using Unna's wraps and also recommended a lymphedema pump. she has been very noncompliant with using these. 06/28/2016 -- the patient is still on antibiotics as prescribed by Dr. Ola Spurr and he is asked her to take it for 3 weeks. The patient also says she has a lot of redness and pain in the folds of her thigh and lower extremity and this is very painful. 07/26/2016 -- the patient is off antibiotics and has been getting dressing changes 3 times a week Electronic Signature(s) Signed: 07/26/2016 11:56:08 AM By: Christin Fudge MD, FACS Entered By: Christin Fudge on 07/26/2016 11:56:08 [name] ([id number]) -------------------------------------------------------------------------------- Physical Exam Details Patient Name: Brooke Hopkins, Brooke Hopkins 07/26/2016 10:45 Date of Service: AM Medical Record [id number] Number: Patient Account Number: [id number] [birth year] (65 y.o. Treating RN: [name] Barman Date of Birth/Sex: Female) Other Clinician: Primary Care Physician: FITZGERALD, DAVID Treating Christin Fudge Referring Physician: FITZGERALD, DAVID Physician/Extender: Weeks in Treatment: 5 Constitutional . Pulse regular. Respirations normal and unlabored. Afebrile. . Eyes Nonicteric. Reactive to light. Ears, Nose, Mouth, and Throat Lips, teeth, and gums WNL.Marland Kitchen Moist mucosa without lesions. Neck supple and nontender. No palpable supraclavicular or cervical adenopathy. Normal sized without goiter. Respiratory WNL. No retractions.. Cardiovascular Pedal Pulses WNL. No clubbing, cyanosis or edema. Lymphatic No adneopathy. No adenopathy. No adenopathy. Musculoskeletal Adexa without tenderness  or enlargement.. Digits and nails w/o clubbing, cyanosis, infection, petechiae, ischemia, or inflammatory conditions.. Integumentary (Hair, Skin) No suspicious lesions. No crepitus or fluctuance. No peri-wound warmth or erythema. No masses.Marland Kitchen Psychiatric Judgement and insight Intact.. No evidence of depression, anxiety, or agitation.. Notes the lymphedema stage II continue his bilaterally and she still has a lot of fungal action in the skin creases especially around her knee and her feet. The wounds are fairly deep and have significant amount of subcutaneous debris and these were sharply debrided with a #3 curet and bleeding controlled with pressure Electronic Signature(s) Signed: 07/26/2016 11:57:06 AM By: Christin Fudge MD, FACS Entered By: Christin Fudge on 07/26/2016 11:57:06 [name] ([id number]) -------------------------------------------------------------------------------- Physician Orders Details Patient Name: Brooke Hopkins, Brooke Hopkins 07/26/2016 10:45 Date of Service: AM Medical Record [id number] Number: Patient Account Number: [id number] 1951/07/01 (64 y.o. Treating RN: [name] Barman Date of Birth/Sex: Female) Other Clinician: Primary Care Physician:  FITZGERALD, DAVID Treating Christin Fudge Referring Physician: FITZGERALD, DAVID Physician/Extender: Suella Grove in Treatment: 5 Verbal / Phone Orders: Yes Clinician: [name] Barman Read Back and Verified: Yes Diagnosis Coding Wound Cleansing Wound #1 Left,Circumferential Lower Leg o Cleanse wound with mild soap and water o May Shower, gently pat wound dry prior to applying new dressing. o May shower with protection. Wound #2 Right,Circumferential Lower Leg o Cleanse wound with mild soap and water o May Shower, gently pat wound dry prior to applying new dressing. o May shower with protection. Anesthetic Wound #1 Left,Circumferential Lower Leg o Topical Lidocaine 4% cream applied to wound bed prior to  debridement Wound #2 Right,Circumferential Lower Leg o Topical Lidocaine 4% cream applied to wound bed prior to debridement Skin Barriers/Peri-Wound Care Wound #1 Left,Circumferential Lower Leg o Other: - Lotrisone cream in folds at knees and on reddened areas on legs (Nystatin used in clinic) Wound #2 Right,Circumferential Lower Leg o Other: - Lotrisone cream in folds at knees and on reddened areas on legs (Nystatin used in clinic) Primary Wound Dressing Wound #1 Left,Circumferential Lower Leg o Aquacel Ag Wound #2 Right,Circumferential Lower Leg o Aquacel Ag Secondary Dressing Wound #1 Left,Circumferential Lower Leg MALILLANY, Brooke Hopkins ([id number]) o ABD pad o XtraSorb Wound #2 Right,Circumferential Lower Leg o ABD pad o XtraSorb Dressing Change Frequency Wound #1 Left,Circumferential Lower Leg o Change Dressing Monday, Wednesday, Friday Wound #2 Right,Circumferential Lower Leg o Change Dressing Monday, Wednesday, Friday Follow-up Appointments Wound #1 Left,Circumferential Lower Leg o Return Appointment in 1 week. Wound #2 Right,Circumferential Lower Leg o Return Appointment in 1 week. Edema Control Wound #1 Left,Circumferential Lower Leg o 3 Layer Compression System - Bilateral - May anchor top of wrap with dome paste, but only wrap around leg one time. o Elevate legs to the level of the heart and pump ankles as often as possible Wound #2 Right,Circumferential Lower Leg o 3 Layer Compression System - Bilateral - May anchor top of wrap with dome paste, but only wrap around leg one time. o Elevate legs to the level of the heart and pump ankles as often as possible Home Health Wound #1 Left,Circumferential Lower Leg o [place] Visits - [place] Nurse may visit PRN to address patientos wound care needs. o FACE TO FACE ENCOUNTER: MEDICARE and MEDICAID PATIENTS: I certify that this patient is under my care and  that I had a face-to-face encounter that meets the physician face-to-face encounter requirements with this patient on this date. The encounter with the patient was in whole or in part for the following MEDICAL CONDITION: (primary reason for [place]) MEDICAL NECESSITY: I certify, that based on my findings, NURSING services are a medically necessary home health service. HOME BOUND STATUS: I certify that my clinical findings support that this patient is homebound (i.e., Due to illness or injury, pt requires aid of supportive devices such as crutches, cane, wheelchairs, walkers, the use of special transportation or the assistance of another person to leave their place of residence. There is a normal inability to leave the home and doing so requires considerable and taxing effort. Other absences are for medical reasons / religious services and are infrequent or of short duration when for other reasons). TOMARA, LASH ([id number]) o Please direct any NON-WOUND related issues/requests for orders to patient's Primary Care Physician Wound #2 Right,Circumferential Lower Leg o [place] Nurse may visit PRN to address patientos wound care needs. o FACE TO FACE ENCOUNTER:  MEDICARE and MEDICAID PATIENTS: I certify that this patient is under my care and that I had a face-to-face encounter that meets the physician face-to-face encounter requirements with this patient on this date. The encounter with the patient was in whole or in part for the following MEDICAL CONDITION: (primary reason for [place]) MEDICAL NECESSITY: I certify, that based on my findings, NURSING services are a medically necessary home health service. HOME BOUND STATUS: I certify that my clinical findings support that this patient is homebound (i.e., Due to illness or injury, pt requires aid of supportive devices such as crutches, cane, wheelchairs, walkers, the use of  special transportation or the assistance of another person to leave their place of residence. There is a normal inability to leave the home and doing so requires considerable and taxing effort. Other absences are for medical reasons / religious services and are infrequent or of short duration when for other reasons). o Please direct any NON-WOUND related issues/requests for orders to patient's Primary Care Physician Electronic Signature(s) Signed: 07/26/2016 3:01:37 PM By: Christin Fudge MD, FACS Signed: 07/26/2016 4:38:27 PM By: Gretta Cool RN, BSN, Kim RN, BSN Entered By: Gretta Cool, RN, BSN, Kim on 07/26/2016 11:59:03 OLYVE, STROBLE ([id number]) -------------------------------------------------------------------------------- Problem List Details Patient Name: JALON, ISHERWOOD 07/26/2016 10:45 Date of Service: AM Medical Record [id number] Number: Patient Account Number: [id number] 09-Oct-1951 (65 y.o. 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 07/26/2016 10:45 Date of Service: AM Medical Record [id number] Number: Patient Account Number: [id number] 07-21-1951 (65 y.o. Treating RN: [name] Barman Date of Birth/Sex: Female) Other Clinician: Primary Care Physician: FITZGERALD, DAVID Treating Christin Fudge Referring Physician: FITZGERALD, DAVID Physician/Extender: Weeks in Treatment: 5 Subjective Chief Complaint Information obtained from Patient Patients presents for treatment of an open diabetic ulcer and significant lymphedema which she's had for about 12 years History of Present Illness (HPI) The following HPI elements were documented for the patient's wound: Location: massive swelling of both lower extremities and ulceration both lower extremities Quality: Patient reports experiencing a dull pain to affected area(s). Severity: Patient states wound are getting worse. Duration: Patient has had the wound for >2 years prior to seeking treatment at the wound center Timing: Pain in wound is constant (hurts all the time) Context: The wound appeared gradually over time Modifying Factors: Other treatment(s) tried include:she has a lymphedema pump but uses it seldom and she's had several course of antibiotics Associated Signs and Symptoms: Patient reports having difficulty standing for long periods. 65 year old patient seen by Dr. Ola Spurr of infectious disease who has been following up for left lower extremity cellulitis and ulcer with recurrent bilateral lower extremity problems for several months. Recently she had a large right lower extremity bullae which opened out and has been ulcerated. She has seen the  vascular group and has been getting Unna's wraps and has a lymphedema pump used in the past. Her prior cultures were positive for Pseudomonas, Proteus and was treated with amoxicillin. He has also been treated with 2 weeks course of ciprofloxacin and amoxicillin.. Increase of Lasix dose  helped with the edema and echo showed no systolic CHF but may be diastolic problems. Past medical history significant for diabetes mellitus type 2, venous stasis ulcer, obesity, diabetic peripheral neuropathy, status post back surgery, cholecystectomy, hysterectomy, arthroscopy of the knee. He is a former smoker and quit smoking in 1984. The patient has seen Dr. Delana Meyer who did not recommend any arterial or venous duplex studies and has been using Unna's wraps and also recommended a lymphedema pump. she has been very noncompliant with using these. 06/28/2016 -- the patient is still on antibiotics as prescribed by Dr. Ola Spurr and he is asked her to take it for 3 weeks. The patient also says she has a lot of redness and pain in the folds of her thigh and lower extremity and this is very painful. MARYANNA, DUDIK ([id number]) 07/26/2016 -- the patient is off antibiotics and has been getting dressing changes 3 times a week Objective Constitutional Pulse regular. Respirations normal and unlabored. Afebrile. Vitals Time Taken: 11:07 AM, Height: 67 in, Weight: 300 lbs, BMI: 47, Temperature: 98.1 F, Pulse: 67 bpm, Respiratory Rate: 20 breaths/min, Blood Pressure: 184/60 mmHg. Eyes Nonicteric. Reactive to light. Ears, Nose, Mouth, and Throat Lips, teeth, and gums WNL.Marland Kitchen Moist mucosa without lesions. Neck supple and nontender. No palpable supraclavicular or cervical adenopathy. Normal sized without goiter. Respiratory WNL. No retractions.. Cardiovascular Pedal Pulses WNL. No clubbing, cyanosis or edema. Lymphatic No adneopathy. No adenopathy. No adenopathy. Musculoskeletal Adexa without tenderness or enlargement.. Digits and nails w/o clubbing, cyanosis, infection, petechiae, ischemia, or inflammatory conditions.Marland Kitchen Psychiatric Judgement and insight Intact.. No evidence of depression, anxiety, or agitation.. General Notes: the lymphedema stage II continue his bilaterally and she still  has a lot of fungal action in the skin creases especially around her knee and her feet. The wounds are fairly deep and have significant amount of subcutaneous debris and these were sharply debrided with a #3 curet and bleeding controlled with pressure Integumentary (Hair, Skin) No suspicious lesions. No crepitus or fluctuance. No peri-wound warmth or erythema. No masses.Marland Kitchen ILICIA, [id number] ([id number]) Wound #1 status is Open. Original cause of wound was Gradually Appeared. The wound is located on the Left,Circumferential Lower Leg. The wound measures 6cm length x 8cm width x 0.2cm depth; 37.699cm^2 area and 7.54cm^3 volume. The wound is limited to skin breakdown. There is no tunneling or undermining noted. There is a large amount of serous drainage noted. The wound margin is flat and intact. There is medium (34-66%) pink, pale granulation within the wound bed. There is a small (1-33%) amount of necrotic tissue within the wound bed including Adherent Slough. The periwound skin appearance exhibited: Localized Edema, Scarring, Maceration, Moist, Hemosiderin Staining. The periwound skin appearance did not exhibit: Callus, Crepitus, Excoriation, Fluctuance, Friable, Induration, Rash, Dry/Scaly, Atrophie Blanche, Cyanosis, Ecchymosis, Mottled, Pallor, Rubor, Erythema. Periwound temperature was noted as No Abnormality. The periwound has tenderness on palpation. Wound #2 status is Open. Original cause of wound was Gradually Appeared. The wound is located on the Right,Circumferential Lower Leg. The wound measures 11cm length x 18cm width x 0.3cm depth; 155.509cm^2 area and 46.653cm^3 volume. The wound is limited to skin breakdown. There is no tunneling or undermining noted. There is a large amount  of serous drainage noted. The wound margin is indistinct and nonvisible. There is medium (34-66%) red granulation within the wound bed. There is a medium (34- 66%) amount of necrotic tissue within the wound  bed including Adherent Slough. The periwound skin appearance exhibited: Localized Edema, Scarring, Maceration, Moist, Hemosiderin Staining. The periwound skin appearance did not exhibit: Callus, Crepitus, Excoriation, Fluctuance, Friable, Induration, Rash, Dry/Scaly, Atrophie Blanche, Cyanosis, Ecchymosis, Mottled, Pallor, Rubor, Erythema. Periwound temperature was noted as No Abnormality. The periwound has tenderness on palpation. 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 There was a Skin/Subcutaneous Tissue Debridement [id number]) debridement with total area of 12 sq cm performed by Christin Fudge, MD. with the following instrument(s): Curette to remove Viable and Non-Viable tissue/material including Exudate, Fibrin/Slough, and Subcutaneous after achieving pain control using Other (lidocaine 4%). A time out was conducted prior to the start of the procedure. A Moderate amount of bleeding Brooke Hopkins, [place]. ([id number]) was controlled with Pressure. The procedure was tolerated well with a pain level of 2 throughout and a pain level of 2 following the procedure. Post Debridement Measurements: 6cm length x 2cm width x 0.2cm depth; 1.885cm^3 volume. Post procedure Diagnosis Wound #1: Same as Pre-Procedure Wound #2 Wound #2 is a Lymphedema located on the Right,Circumferential Lower Leg . There was a Skin/Subcutaneous Tissue Debridement [id number]) debridement with total area of 25 sq cm performed by Christin Fudge, MD. with the following instrument(s): Curette to remove  Viable and Non-Viable tissue/material including Exudate, Fat, Fibrin/Slough, and Subcutaneous after achieving pain control using Other (lidocaine 4%). A time out was conducted prior to the start of the procedure. A Moderate amount of bleeding was controlled with Pressure. The procedure was tolerated well with a pain level of 2 throughout and a pain level of 2 following the procedure. Post Debridement Measurements: 5cm length x 5cm width x 0.3cm depth; 5.89cm^3 volume. Post procedure Diagnosis Wound #2: Same as Pre-Procedure Plan Wound Cleansing: Wound #1 Left,Circumferential Lower Leg: Cleanse wound with mild soap and water May Shower, gently pat wound dry prior to applying new dressing. May shower with protection. Wound #2 Right,Circumferential Lower Leg: Cleanse wound with mild soap and water May Shower, gently pat wound dry prior to applying new dressing. May shower with protection. Anesthetic: Wound #1 Left,Circumferential Lower Leg: Topical Lidocaine 4% cream applied to wound bed prior to debridement Wound #2 Right,Circumferential Lower Leg: Topical Lidocaine 4% cream applied to wound bed prior to debridement Skin Barriers/Peri-Wound Care: Wound #1 Left,Circumferential Lower Leg: Other: - Lotrisone cream in folds at knees and on reddened areas on legs (Nystatin used in clinic) Wound #2 Right,Circumferential Lower Leg: Other: - Lotrisone cream in folds at knees and on reddened areas on legs (Nystatin used in clinic) Primary Wound Dressing: Wound #1 Left,Circumferential Lower Leg: Aquacel Ag Wound #2 Right,Circumferential Lower Leg: Aquacel Ag Secondary Dressing: Wound #1 Left,Circumferential Lower Leg: Brooke Hopkins, Brooke Hopkins ([id number]) ABD pad XtraSorb Wound #2 Right,Circumferential Lower Leg: ABD pad XtraSorb Dressing Change Frequency: Wound #1 Left,Circumferential Lower Leg: Change Dressing Monday, Wednesday, Friday Wound #2 Right,Circumferential Lower Leg: Change  Dressing Monday, Wednesday, Friday Follow-up Appointments:  Wound #1 Left,Circumferential Lower Leg: Return Appointment in 1 week. Wound #2 Right,Circumferential Lower Leg: Return Appointment in 1 week. Edema Control: Wound #1 Left,Circumferential Lower Leg: 3 Layer Compression System - Bilateral - May anchor top of wrap with dome paste, but only wrap around leg one time. Elevate legs to the level of the heart and pump ankles as often as possible Wound #2 Right,Circumferential Lower Leg: 3 Layer Compression System - Bilateral - May anchor top of wrap with dome paste, but only wrap around leg one time. Elevate legs to the level of the heart and pump ankles as often as possible Home Health: Wound #1 Left,Circumferential Lower Leg: Continue Home Health Visits - [place] Nurse may visit PRN to address patient s wound care needs. FACE TO FACE ENCOUNTER: MEDICARE and MEDICAID PATIENTS: I certify that this patient is under my care and that I had a face-to-face encounter that meets the physician face-to-face encounter requirements with this patient on this date. The encounter with the patient was in whole or in part for the following MEDICAL CONDITION: (primary reason for [place]) MEDICAL NECESSITY: I certify, that based on my findings, NURSING services are a medically necessary home health service. HOME BOUND STATUS: I certify that my clinical findings support that this patient is homebound (i.e., Due to illness or injury, pt requires aid of supportive devices such as crutches, cane, wheelchairs, walkers, the use of special transportation or the assistance of another person to leave their place of residence. There is a normal inability to leave the home and doing so requires considerable and taxing effort. Other absences are for medical reasons / religious services and are infrequent or of short duration when for other reasons). Please direct any NON-WOUND related  issues/requests for orders to patient's Primary Care Physician Wound #2 Right,Circumferential Lower Leg: [place] Nurse may visit PRN to address patient s wound care needs. FACE TO FACE ENCOUNTER: MEDICARE and MEDICAID PATIENTS: I certify that this patient is under my care and that I had a face-to-face encounter that meets the physician face-to-face encounter requirements with this patient on this date. The encounter with the patient was in whole or in part for the following MEDICAL CONDITION: (primary reason for [place]) MEDICAL NECESSITY: I certify, that based on my findings, NURSING services are a medically necessary home health service. HOME BOUND STATUS: I certify that my clinical findings support that this patient is homebound (i.e., Due to illness or injury, pt requires aid of supportive devices such as crutches, cane, wheelchairs, walkers, the use of special transportation or the assistance of another person to leave their place of residence. There is a normal inability to leave the home and doing so requires considerable and taxing effort. Other absences are Brooke Hopkins, Brooke Hopkins ([id number]) for medical reasons / religious services and are infrequent or of short duration when for other reasons). Please direct any NON-WOUND related issues/requests for orders to patient's Primary Care Physician I recommended: 1. Silver alginate to the wounds and Profore lite to be applied to both lower extremities and change 3 times a week. 2. I have put her on Lotrisone cream to be applied daily to the areas of fungal infection in the popliteal fossa area 3. Elevation and exercise 4. Good control of her diabetes mellitus. 5. Regular visits to the wound center. 6. using a lymphedema pumps for an hour each twice a day I have spent some time discussing with the patient and her husband  the importance of compliance and the fact that she has all the equipment but has  not been using it appropriately. They both understand and say they will be compliant. Electronic Signature(s) Signed: 07/26/2016 3:03:50 PM By: Christin Fudge MD, FACS Previous Signature: 07/26/2016 3:03:35 PM Version By: Christin Fudge MD, FACS Previous Signature: 07/26/2016 11:58:01 AM Version By: Christin Fudge MD, FACS Entered By: Christin Fudge on 07/26/2016 15:03:50 [name] ([id number]) -------------------------------------------------------------------------------- SuperBill Details Patient Name: ANTHONY, SCHAAD. Date of Service: 07/26/2016 Medical Record Number: [id number] Patient Account Number: [id number] Date of Birth/Sex: 11-03-1951 (65 y.o. 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 E66.01 Morbid (severe) obesity due to excess calories B37.2 Candidiasis of skin and nail Facility Procedures CPT4 Code: [id number] Description: [id number] - DEB SUBQ TISSUE 20 SQ CM/< ICD-10 Description Diagnosis E11.622 Type 2 diabetes mellitus with other skin ulcer I89.0 Lymphedema, not elsewhere classified L97.222 Non-pressure chronic ulcer of left calf with fat l 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 Modifier: ayer exposed layer exposed Quantity: 1 CPT4 Code: [id number] Description: 11045 - DEB SUBQ TISS EA ADDL 20CM ICD-10 Description Diagnosis E11.622 Type 2 diabetes mellitus with other skin ulcer I89.0 Lymphedema, not elsewhere classified L97.222 Non-pressure chronic ulcer of left calf with fat l L97.212 Non-pressure  chronic ulcer of  right calf with fat Modifier: ayer exposed layer exposed Quantity: 1 Physician Procedures CPT4 Code: [id number] [place], [place] Description: 11042 - WC PHYS SUBQ TISS 20 SQ CM ICD-10 Description Diagnosis E11.622 Type 2 diabetes mellitus with other skin ulcer I89.0 Lymphedema, not elsewhere classified NDA M. ([id number]) Modifier: Quantity: 1 Electronic Signature(s) Signed: 07/26/2016 11:58:22 AM By: Christin Fudge MD, FACS Entered By: Christin Fudge on 07/26/2016 11:58:22

## 2016-08-02 ENCOUNTER — Encounter: Payer: 59 | Attending: Surgery | Admitting: Surgery

## 2016-08-02 DIAGNOSIS — Z87891 Personal history of nicotine dependence: Secondary | ICD-10-CM | POA: Insufficient documentation

## 2016-08-02 DIAGNOSIS — Z6841 Body Mass Index (BMI) 40.0 and over, adult: Secondary | ICD-10-CM | POA: Diagnosis not present

## 2016-08-02 DIAGNOSIS — L97212 Non-pressure chronic ulcer of right calf with fat layer exposed: Secondary | ICD-10-CM | POA: Insufficient documentation

## 2016-08-02 DIAGNOSIS — I890 Lymphedema, not elsewhere classified: Secondary | ICD-10-CM | POA: Diagnosis not present

## 2016-08-02 DIAGNOSIS — E11622 Type 2 diabetes mellitus with other skin ulcer: Secondary | ICD-10-CM | POA: Diagnosis present

## 2016-08-02 DIAGNOSIS — B372 Candidiasis of skin and nail: Secondary | ICD-10-CM | POA: Diagnosis not present

## 2016-08-02 DIAGNOSIS — E1140 Type 2 diabetes mellitus with diabetic neuropathy, unspecified: Secondary | ICD-10-CM | POA: Insufficient documentation

## 2016-08-02 DIAGNOSIS — L97821 Non-pressure chronic ulcer of other part of left lower leg limited to breakdown of skin: Secondary | ICD-10-CM | POA: Diagnosis not present

## 2016-08-02 DIAGNOSIS — L97222 Non-pressure chronic ulcer of left calf with fat layer exposed: Secondary | ICD-10-CM | POA: Insufficient documentation

## 2016-08-02 DIAGNOSIS — L97811 Non-pressure chronic ulcer of other part of right lower leg limited to breakdown of skin: Secondary | ICD-10-CM | POA: Diagnosis not present

## 2016-08-02 NOTE — Progress Notes (Addendum)
[name], [name] ([id number]) Visit Report for 08/02/2016 Chief Complaint Document Details Patient Name: Brooke Hopkins, Brooke Hopkins. Date of Service: 08/02/2016 8:00 AM Medical Record Number: [id number] Patient Account Number: [id number] Date of Birth/Sex: [birth year] (65 y.o. Female) Treating RN: [name] Barman Primary Care Physician: FITZGERALD, DAVID Other Clinician: Referring Physician: FITZGERALD, DAVID Treating Physician/Extender: Frann Rider in Treatment: 6 Information Obtained from: Patient Chief Complaint Patients presents for treatment of an open diabetic ulcer and significant lymphedema which she's had for about two years Electronic Signature(s) Signed: 08/02/2016 9:01:27 AM By: Christin Fudge MD, FACS Previous Signature: 08/02/2016 8:49:31 AM Version By: Christin Fudge MD, FACS Entered By: Christin Fudge on 08/02/2016 09:01:26 Brooke Hopkins ([id number]) -------------------------------------------------------------------------------- HPI Details Patient Name: Brooke Hopkins, Brooke Hopkins. Date of Service: 08/02/2016 8:00 AM Medical Record Number: [id number] Patient Account Number: [id number] Date of Birth/Sex: [birth year] (65 y.o. Female) Treating RN: [name] Barman Primary Care Physician: FITZGERALD, DAVID Other Clinician: Referring Physician: FITZGERALD, DAVID Treating Physician/Extender: Frann Rider in Treatment: 6 History of Present Illness Location: massive swelling of both lower extremities and ulceration both lower extremities Quality: Patient reports experiencing a dull pain to affected area(s). Severity: Patient states wound are getting worse. Duration: Patient has had the wound for >2 years prior to seeking treatment at the wound center Timing: Pain in wound is constant (hurts all the time) Context: The wound appeared gradually over time Modifying Factors: Other treatment(s) tried include:she has a lymphedema pump but uses it seldom and she's had several course of  antibiotics Associated Signs and Symptoms: Patient reports having difficulty standing for long periods. HPI Description: 65 year old patient seen by Dr. Ola Spurr of infectious disease who has been following up for left lower extremity cellulitis and ulcer with recurrent bilateral lower extremity problems for several months. Recently she had a large right lower extremity bullae which opened out and has been ulcerated. She has seen the vascular group and has been getting Unna's wraps and has a lymphedema pump used in the past. Her prior cultures were positive for Pseudomonas, Proteus and was treated with amoxicillin. He has also been treated with 2 weeks course of ciprofloxacin and amoxicillin.. Increase of Lasix dose helped with the edema and echo showed no systolic CHF but may be diastolic problems. Past medical history significant for diabetes mellitus type 2, venous stasis ulcer, obesity, diabetic peripheral neuropathy, status post back surgery, cholecystectomy, hysterectomy, arthroscopy of the knee. He is a former smoker and quit smoking in 1984. The patient has seen Dr. Delana Meyer who did not recommend any arterial or venous duplex studies and has been using Unna's wraps and also recommended a lymphedema pump. she has been very noncompliant with using these. 06/28/2016 -- the patient is still on antibiotics as prescribed by Dr. Ola Spurr and he is asked her to take it for 3 weeks. The patient also says she has a lot of redness and pain in the folds of her thigh and lower extremity and this is very painful. 07/26/2016 -- the patient is off antibiotics and has been getting dressing changes 3 times a week Electronic Signature(s) Signed: 08/02/2016 9:01:42 AM By: Christin Fudge MD, FACS Previous Signature: 08/02/2016 8:49:38 AM Version By: Christin Fudge MD, FACS Entered By: Christin Fudge on 08/02/2016 09:01:42 Brooke Hopkins  ([id number]) -------------------------------------------------------------------------------- Physical Exam Details Patient Name: Brooke Hopkins, Brooke Hopkins. Date of Service: 08/02/2016 8:00 AM Medical Record Number: [id number] Patient Account Number: [id number] Date of Birth/Sex: 13-Dec-1951 (65 y.o. Female) Treating RN: [name] Barman Primary Care Physician: FITZGERALD, DAVID Other Clinician:  Referring Physician: FITZGERALD, DAVID Treating Physician/Extender: Frann Rider in Treatment: 6 Constitutional . Pulse regular. Respirations normal and unlabored. Afebrile. . Eyes Nonicteric. Reactive to light. Ears, Nose, Mouth, and Throat Lips, teeth, and gums WNL.Marland Kitchen Moist mucosa without lesions. Neck supple and nontender. No palpable supraclavicular or cervical adenopathy. Normal sized without goiter. Respiratory WNL. No retractions.. Breath sounds WNL, No rubs, rales, rhonchi, or wheeze.. Cardiovascular Heart rhythm and rate regular, no murmur or gallop.. Pedal Pulses WNL. No clubbing, cyanosis or edema. Chest Breasts symmetical and no nipple discharge.. Breast tissue WNL, no masses, lumps, or tenderness.. Lymphatic No adneopathy. No adenopathy. No adenopathy. Musculoskeletal Adexa without tenderness or enlargement.. Digits and nails w/o clubbing, cyanosis, infection, petechiae, ischemia, or inflammatory conditions.. Integumentary (Hair, Skin) No suspicious lesions. No crepitus or fluctuance. No peri-wound warmth or erythema. No masses.Marland Kitchen Psychiatric Judgement and insight Intact.. No evidence of depression, anxiety, or agitation.. Notes she had a lot of thick green drainage very like a Pseudomonas infection and her edema has gotten a bit worse. Her fungal infection in the skin creases is much better. No sharp debridement was required today. Electronic Signature(s) Signed: 08/02/2016 8:50:30 AM By: Christin Fudge MD, FACS Entered By: Christin Fudge on 08/02/2016 08:50:30 Brooke Hopkins  ([id number]) -------------------------------------------------------------------------------- Physician Orders Details Patient Name: Brooke Hopkins, Brooke Hopkins. Date of Service: 08/02/2016 8:00 AM Medical Record Number: [id number] Patient Account Number: [id number] Date of Birth/Sex: [birth year] (65 y.o. Female) Treating RN: Montey Hora Primary Care Physician: FITZGERALD, DAVID Other Clinician: Referring Physician: FITZGERALD, DAVID Treating Physician/Extender: Frann Rider in Treatment: 6 Verbal / Phone Orders: Yes Clinician: Montey Hora Read Back and Verified: Yes Diagnosis Coding Wound Cleansing Wound #1 Left,Circumferential Lower Leg o Cleanse wound with mild soap and water o May Shower, gently pat wound dry prior to applying new dressing. o May shower with protection. Wound #2 Right,Circumferential Lower Leg o Cleanse wound with mild soap and water o May Shower, gently pat wound dry prior to applying new dressing. o May shower with protection. Anesthetic Wound #1 Left,Circumferential Lower Leg o Topical Lidocaine 4% cream applied to wound bed prior to debridement Wound #2 Right,Circumferential Lower Leg o Topical Lidocaine 4% cream applied to wound bed prior to debridement Skin Barriers/Peri-Wound Care Wound #1 Left,Circumferential Lower Leg o Other: - Lotrisone cream in folds at knees and on reddened areas on legs (Nystatin used in clinic) Wound #2 Right,Circumferential Lower Leg o Other: - Lotrisone cream in folds at knees and on reddened areas on legs (Nystatin used in clinic) Primary Wound Dressing Wound #1 Left,Circumferential Lower Leg o Aquacel Ag Wound #2 Right,Circumferential Lower Leg o Aquacel Ag Secondary Dressing Wound #1 Left,Circumferential Lower Leg o ABD pad o VEONICA, TIERCE. ([id number]) Wound #2 Right,Circumferential Lower Leg o ABD pad o XtraSorb Dressing Change Frequency Wound #1  Left,Circumferential Lower Leg o Change Dressing Monday, Wednesday, Friday Wound #2 Right,Circumferential Lower Leg o Change Dressing Monday, Wednesday, Friday Follow-up Appointments Wound #1 Left,Circumferential Lower Leg o Return Appointment in 1 week. Wound #2 Right,Circumferential Lower Leg o Return Appointment in 1 week. Edema Control Wound #1 Left,Circumferential Lower Leg o 3 Layer Compression System - Bilateral - May anchor top of wrap with dome paste, but only wrap around leg one time. o Elevate legs to the level of the heart and pump ankles as often as possible Wound #2 Right,Circumferential Lower Leg o 3 Layer Compression System - Bilateral - May anchor top of wrap with dome paste, but only wrap around leg one  time. o Elevate legs to the level of the heart and pump ankles as often as possible Home Health Wound #1 Left,Circumferential Lower Leg o [place] Visits - [place] Nurse may visit PRN to address patientos wound care needs. o FACE TO FACE ENCOUNTER: MEDICARE and MEDICAID PATIENTS: I certify that this patient is under my care and that I had a face-to-face encounter that meets the physician face-to-face encounter requirements with this patient on this date. The encounter with the patient was in whole or in part for the following MEDICAL CONDITION: (primary reason for [place]) MEDICAL NECESSITY: I certify, that based on my findings, NURSING services are a medically necessary home health service. HOME BOUND STATUS: I certify that my clinical findings support that this patient is homebound (i.e., Due to illness or injury, pt requires aid of supportive devices such as crutches, cane, wheelchairs, walkers, the use of special transportation or the assistance of another person to leave their place of residence. There is a normal inability to leave the home and doing so requires considerable and taxing effort.  Other absences are for medical reasons / religious services and are infrequent or of short duration when for other reasons). o Please direct any NON-WOUND related issues/requests for orders to patient's Primary Care Physician ALSHA, CHMELIK ([id number]) Wound #2 Right,Circumferential Lower Leg o [place] Nurse may visit PRN to address patientos wound care needs. o FACE TO FACE ENCOUNTER: MEDICARE and MEDICAID PATIENTS: I certify that this patient is under my care and that I had a face-to-face encounter that meets the physician face-to-face encounter requirements with this patient on this date. The encounter with the patient was in whole or in part for the following MEDICAL CONDITION: (primary reason for [place]) MEDICAL NECESSITY: I certify, that based on my findings, NURSING services are a medically necessary home health service. HOME BOUND STATUS: I certify that my clinical findings support that this patient is homebound (i.e., Due to illness or injury, pt requires aid of supportive devices such as crutches, cane, wheelchairs, walkers, the use of special transportation or the assistance of another person to leave their place of residence. There is a normal inability to leave the home and doing so requires considerable and taxing effort. Other absences are for medical reasons / religious services and are infrequent or of short duration when for other reasons). o Please direct any NON-WOUND related issues/requests for orders to patient's Primary Care Physician Medications-please add to medication list. Wound #2 Right,Circumferential Lower Leg o P.O. Antibiotics - Cipro Patient Medications Allergies: no know allergies Notifications Medication Indication Start End Cipro 08/02/2016 DOSE oral 500 mg tablet - tablet oral bid Electronic Signature(s) Signed: 08/02/2016 4:14:16 PM By: Christin Fudge MD, FACS Signed: 08/02/2016 4:47:48 PM By:  Montey Hora Previous Signature: 08/02/2016 8:49:08 AM Version By: Christin Fudge MD, FACS Entered By: Montey Hora on 08/02/2016 09:12:01 Brooke Hopkins ([id number]) -------------------------------------------------------------------------------- Problem List Details Patient Name: Brooke Hopkins, Brooke Hopkins. Date of Service: 08/02/2016 8:00 AM Medical Record Number: [id number] Patient Account Number: [id number] Date of Birth/Sex: 1951-04-02 (65 y.o. 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 Date of Service: 08/02/2016 8:00 AM Medical Record Number: [id number] Patient Account Number: [id number] Date of Birth/Sex: 1951/09/21 (65 y.o. Female) Treating RN: [name] Barman Primary Care Physician: FITZGERALD, DAVID Other Clinician: Referring Physician: FITZGERALD, DAVID Treating Physician/Extender: Frann Rider in Treatment: 6 Subjective Chief Complaint Information obtained from Patient Patients presents for treatment of an  open diabetic ulcer and significant lymphedema which she's had for about two years History of Present Illness (HPI) The following HPI elements were documented for the patient's wound: Location: massive swelling of both lower extremities and ulceration both lower extremities Quality: Patient reports experiencing a dull pain to affected area(s). Severity: Patient states wound are getting worse. Duration: Patient has had the wound for >2 years prior to seeking treatment at the wound center Timing: Pain in wound is constant (hurts all the time) Context: The wound appeared gradually over time Modifying Factors: Other treatment(s) tried include:she has a lymphedema pump but uses it seldom and she's had several course of antibiotics Associated Signs and Symptoms: Patient reports having difficulty standing for long periods. 65 year old patient seen by Dr. Ola Spurr of infectious disease who has been following up for left lower extremity cellulitis and ulcer with recurrent bilateral lower extremity problems for several months. Recently she had a large right lower extremity bullae which opened out and has been ulcerated. She has seen the vascular group and has been getting Unna's wraps and has a lymphedema pump used in the past. Her prior cultures were positive for Pseudomonas, Proteus and was treated with amoxicillin. He has also been treated with 2 weeks course of ciprofloxacin and amoxicillin.. Increase of Lasix dose helped with the edema and echo showed no systolic CHF but may be diastolic problems. Past medical history significant for diabetes mellitus type 2, venous stasis ulcer, obesity, diabetic peripheral neuropathy, status post back surgery, cholecystectomy, hysterectomy, arthroscopy of the knee. He is a former smoker and quit smoking in 1984. The patient has seen Dr. Delana Meyer who did not recommend any arterial or venous duplex studies and has been using Unna's wraps and also recommended a  lymphedema pump. she has been very noncompliant with using these. 06/28/2016 -- the patient is still on antibiotics as prescribed by Dr. Ola Spurr and he is asked her to take it for 3 weeks. The patient also says she has a lot of redness and pain in the folds of her thigh and lower extremity and this is very painful. 07/26/2016 -- the patient is off antibiotics and has been getting dressing changes 3 times a week Brooke Hopkins, Brooke Hopkins. ([id number]) Objective Constitutional Pulse regular. Respirations normal and unlabored. Afebrile. Vitals Time Taken: 8:04 AM, Height: 67 in, Weight: 300 lbs, BMI: 47, Temperature: 98.1 F, Pulse: 88 bpm, Respiratory Rate: 20 breaths/min, Blood Pressure: 181/57 mmHg. Eyes Nonicteric. Reactive to light. Ears, Nose, Mouth, and Throat Lips, teeth, and gums WNL.Marland Kitchen Moist mucosa without lesions. Neck supple and nontender. No palpable supraclavicular or cervical adenopathy. Normal sized without goiter. Respiratory WNL. No retractions.. Breath sounds WNL, No rubs, rales, rhonchi, or wheeze.. Cardiovascular Heart rhythm and rate regular, no murmur or gallop.. Pedal Pulses WNL. No clubbing,  cyanosis or edema. Chest Breasts symmetical and no nipple discharge.. Breast tissue WNL, no masses, lumps, or tenderness.. Lymphatic No adneopathy. No adenopathy. No adenopathy. Musculoskeletal Adexa without tenderness or enlargement.. Digits and nails w/o clubbing, cyanosis, infection, petechiae, ischemia, or inflammatory conditions.Marland Kitchen Psychiatric Judgement and insight Intact.. No evidence of depression, anxiety, or agitation.. General Notes: she had a lot of thick green drainage very like a Pseudomonas infection and her edema has gotten a bit worse. Her fungal infection in the skin creases is much better. No sharp debridement was required today. Integumentary (Hair, Skin) No suspicious lesions. No crepitus or fluctuance. No peri-wound warmth or erythema. No  masses.Marland Kitchen Brooke Hopkins, Brooke Hopkins ([id number]) Wound #1 status is Open. Original cause of wound was Gradually Appeared. The wound is located on the Left,Circumferential Lower Leg. The wound measures 3cm length x 7cm width x 0.2cm depth; 16.493cm^2 area and 3.299cm^3 volume. The wound is limited to skin breakdown. There is a large amount of serous drainage noted. The wound margin is flat and intact. There is medium (34-66%) pink, pale granulation within the wound bed. There is a small (1-33%) amount of necrotic tissue within the wound bed including Adherent Slough. The periwound skin appearance exhibited: Localized Edema, Scarring, Maceration, Moist, Hemosiderin Staining. The periwound skin appearance did not exhibit: Callus, Crepitus, Excoriation, Fluctuance, Friable, Induration, Rash, Dry/Scaly, Atrophie Blanche, Cyanosis, Ecchymosis, Mottled, Pallor, Rubor, Erythema. Periwound temperature was noted as No Abnormality. The periwound has tenderness on palpation. Wound #2 status is Open. Original cause of wound was Gradually Appeared. The wound is located on the Right,Circumferential Lower Leg. The wound measures 19.7cm length x 15cm width x 0.3cm depth; 232.085cm^2 area and 69.626cm^3 volume. The wound is limited to skin breakdown. There is no tunneling or undermining noted. There is a large amount of serous drainage noted. The wound margin is indistinct and nonvisible. There is medium (34-66%) red granulation within the wound bed. There is a medium (34- 66%) amount of necrotic tissue within the wound bed including Adherent Slough. The periwound skin appearance exhibited: Localized Edema, Scarring, Maceration, Moist, Hemosiderin Staining. The periwound skin appearance did not exhibit: Callus, Crepitus, Excoriation, Fluctuance, Friable, Induration, Rash, Dry/Scaly, Atrophie Blanche, Cyanosis, Ecchymosis, Mottled, Pallor, Rubor, Erythema. Periwound temperature was noted as No Abnormality. The periwound  has tenderness on palpation. General Notes: Heavy Green drainage from wounds on right. 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.212 - Non-pressure chronic ulcer of right calf with fat layer exposed E66.01 - Morbid (severe) obesity due to excess calories B37.2 - Candidiasis of skin and nail Plan Wound Cleansing: Wound #1 Left,Circumferential Lower Leg: Cleanse wound with mild soap and water May Shower, gently pat wound dry prior to applying new dressing. Brooke Hopkins, Brooke Hopkins ([id number]) May shower with protection. Wound #2 Right,Circumferential Lower Leg: Cleanse wound with mild soap and water May Shower, gently pat wound dry prior to applying new dressing. May shower with protection. Anesthetic: Wound #1 Left,Circumferential Lower Leg: Topical Lidocaine 4% cream applied to wound bed prior to debridement Wound #2 Right,Circumferential Lower Leg: Topical Lidocaine 4% cream applied to wound bed prior to debridement Skin Barriers/Peri-Wound Care: Wound #1 Left,Circumferential Lower Leg: Other: - Lotrisone cream in folds at knees and on reddened areas on legs (Nystatin used in clinic) Wound #2 Right,Circumferential Lower Leg: Other: - Lotrisone cream in folds at knees and on reddened areas  on legs (Nystatin used in clinic) Primary Wound Dressing: Wound #1 Left,Circumferential Lower Leg: Aquacel Ag Wound #2 Right,Circumferential Lower Leg: Aquacel Ag Secondary Dressing: Wound #1 Left,Circumferential Lower Leg: ABD pad XtraSorb Wound #2 Right,Circumferential Lower Leg: ABD pad XtraSorb Dressing Change Frequency: Wound #1 Left,Circumferential Lower Leg: Change Dressing Monday, Wednesday, Friday Wound #2 Right,Circumferential Lower Leg: Change Dressing Monday, Wednesday, Friday Follow-up Appointments: Wound  #1 Left,Circumferential Lower Leg: Return Appointment in 1 week. Wound #2 Right,Circumferential Lower Leg: Return Appointment in 1 week. Edema Control: Wound #1 Left,Circumferential Lower Leg: 3 Layer Compression System - Bilateral - May anchor top of wrap with dome paste, but only wrap around leg one time. Elevate legs to the level of the heart and pump ankles as often as possible Wound #2 Right,Circumferential Lower Leg: 3 Layer Compression System - Bilateral - May anchor top of wrap with dome paste, but only wrap around leg one time. Elevate legs to the level of the heart and pump ankles as often as possible Home Health: Wound #1 Left,Circumferential Lower Leg: Continue Home Health Visits - [place] Nurse may visit PRN to address patient s wound care needs. FACE TO FACE ENCOUNTER: MEDICARE and MEDICAID PATIENTS: I certify that this patient is under Brooke Hopkins, Brooke Hopkins. ([id number]) my care and that I had a face-to-face encounter that meets the physician face-to-face encounter requirements with this patient on this date. The encounter with the patient was in whole or in part for the following MEDICAL CONDITION: (primary reason for [place]) MEDICAL NECESSITY: I certify, that based on my findings, NURSING services are a medically necessary home health service. HOME BOUND STATUS: I certify that my clinical findings support that this patient is homebound (i.e., Due to illness or injury, pt requires aid of supportive devices such as crutches, cane, wheelchairs, walkers, the use of special transportation or the assistance of another person to leave their place of residence. There is a normal inability to leave the home and doing so requires considerable and taxing effort. Other absences are for medical reasons / religious services and are infrequent or of short duration when for other reasons). Please direct any NON-WOUND related issues/requests for orders to patient's  Primary Care Physician Wound #2 Right,Circumferential Lower Leg: [place] Nurse may visit PRN to address patient s wound care needs. FACE TO FACE ENCOUNTER: MEDICARE and MEDICAID PATIENTS: I certify that this patient is under my care and that I had a face-to-face encounter that meets the physician face-to-face encounter requirements with this patient on this date. The encounter with the patient was in whole or in part for the following MEDICAL CONDITION: (primary reason for [place]) MEDICAL NECESSITY: I certify, that based on my findings, NURSING services are a medically necessary home health service. HOME BOUND STATUS: I certify that my clinical findings support that this patient is homebound (i.e., Due to illness or injury, pt requires aid of supportive devices such as crutches, cane, wheelchairs, walkers, the use of special transportation or the assistance of another person to leave their place of residence. There is a normal inability to leave the home and doing so requires considerable and taxing effort. Other absences are for medical reasons / religious services and are infrequent or of short duration when for other reasons). Please direct any NON-WOUND related issues/requests for orders to patient's Primary Care Physician Medications-please add to medication list.: Wound #2 Right,Circumferential Lower Leg: P.O. Antibiotics - Cipro The following medication(s) was prescribed: Cipro oral 500  mg tablet tablet oral bid starting 08/02/2016 I recommended: 1. Silver alginate to the wounds and Profore lite to be applied to both lower extremities and change 3 times a week. 2. I have put her empirically on ciprofloxacin 500 mg twice a day for 2 weeks 3. I have put her on Lotrisone cream to be applied daily to the areas of fungal infection in the popliteal fossa area 4. Elevation and exercise 5. Good control of her diabetes mellitus. 6. Regular visits to the  wound center. 7. using a lymphedema pumps for an hour each twice a day I have spent some time discussing with the patient and her husband the importance of compliance and the fact that she has all the equipment but has not been using it appropriately. They both understand and say they will be compliant. Brooke Hopkins, Brooke Hopkins ([id number]) Electronic Signature(s) Signed: 08/02/2016 4:17:32 PM By: Christin Fudge MD, FACS Previous Signature: 08/02/2016 9:02:01 AM Version By: Christin Fudge MD, FACS Previous Signature: 08/02/2016 8:51:03 AM Version By: Christin Fudge MD, FACS Entered By: Christin Fudge on 08/02/2016 16:17:31 Brooke Hopkins ([id number]) -------------------------------------------------------------------------------- SuperBill Details Patient Name: Brooke Hopkins, Brooke Hopkins. Date of Service: 08/02/2016 Medical Record Number: [id number] Patient Account Number: [id number] Date of Birth/Sex: 11/26/1951 (65 y.o. 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

## 2016-08-03 NOTE — Progress Notes (Signed)
Brooke Hopkins, Brooke Hopkins ([id number]) Visit Report for 08/02/2016 Arrival Information Details Patient Name: Brooke Hopkins, Brooke Hopkins. Date of Service: 08/02/2016 8:00 AM Medical Record Number: [id number] Patient Account Number: [id number] Date of Birth/Sex: 1951-05-04 (64 y.o. Female) Treating RN: [name] Barman Primary Care Physician: FITZGERALD, DAVID Other Clinician: Referring Physician: FITZGERALD, DAVID Treating Physician/Extender: Frann Rider in Treatment: 6 Visit Information History Since Last Visit Added or deleted any medications: No Patient Arrived: Wheel Chair Any new allergies or adverse reactions: No Arrival Time: 08:02 Had a fall or experienced change in No activities of daily living that may affect Accompanied By: son risk of falls: Transfer Assistance: Manual Signs or symptoms of abuse/neglect since last No Patient Identification Verified: Yes visito Secondary Verification Process Yes Hospitalized since last visit: No Completed: Has Dressing in Place as Prescribed: Yes Patient Requires Transmission-Based No Pain Present Now: No Precautions: Patient Has Alerts: No Electronic Signature(s) Signed: 08/02/2016 4:16:06 PM By: Gretta Cool, RN, BSN, Kim RN, BSN Entered By: Gretta Cool, RN, BSN, Kim on 08/02/2016 08:02:42 [name] ([id number]) -------------------------------------------------------------------------------- Encounter Discharge Information Details Patient Name: Brooke Hopkins, Brooke Hopkins. Date of Service: 08/02/2016 8:00 AM Medical Record Number: [id number] Patient Account Number: [id number] Date of Birth/Sex: 02/14/1951 (64 y.o. Female) Treating RN: [name] Barman Primary Care Physician: FITZGERALD, DAVID Other Clinician: Referring Physician: FITZGERALD, DAVID Treating Physician/Extender: Frann Rider in Treatment: 6 Encounter Discharge Information Items Discharge Pain Level: 0 Discharge Condition: Stable Ambulatory Status: Wheelchair Discharge Destination:  Home Transportation: Private Auto Accompanied By: son Schedule Follow-up Appointment: Yes Medication Reconciliation completed Yes and provided to Patient/Care Zekiah Caruth: Provided on Clinical Summary of Care: 08/02/2016 Form Type Recipient Paper Patient LB Electronic Signature(s) Signed: 08/02/2016 8:54:21 AM By: Ruthine Dose Entered By: Ruthine Dose on 08/02/2016 08:54:21 [name] ([id number]) -------------------------------------------------------------------------------- Lower Extremity Assessment Details Patient Name: Brooke Hopkins, Brooke Hopkins. Date of Service: 08/02/2016 8:00 AM Medical Record Number: [id number] Patient Account Number: [id number] Date of Birth/Sex: 21-Nov-1951 (64 y.o. Female) Treating RN: [name] Barman Primary Care Physician: FITZGERALD, DAVID Other Clinician: Referring Physician: FITZGERALD, DAVID Treating Physician/Extender: Frann Rider in Treatment: 6 Edema Assessment Assessed: [Left: No] [Right: No] E[Left: dema] [Right: :] Calf Left: Right: Point of Measurement: 32 cm From Medial Instep 52.5 cm 54 cm Ankle Left: Right: Point of Measurement: 11 cm From Medial Instep 32.8 cm 32 cm Vascular Assessment Pulses: Posterior Tibial Dorsalis Pedis Palpable: [Left:Yes] [Right:Yes] Extremity colors, hair growth, and conditions: Extremity Color: [Left:Red] [Right:Red] Hair Growth on Extremity: [Left:Yes] [Right:Yes] Temperature of Extremity: [Left:Warm] [Right:Warm] Capillary Refill: [Left:< 3 seconds] [Right:< 3 seconds] Toe Nail Assessment Left: Right: Thick: Yes Yes Discolored: Yes Yes Deformed: Yes Yes Improper Length and Hygiene: Yes Yes Electronic Signature(s) Signed: 08/02/2016 4:16:06 PM By: Gretta Cool, RN, BSN, Kim RN, BSN Entered By: Gretta Cool, RN, BSN, Kim on 08/02/2016 08:12:52 [name] ([id number]) -------------------------------------------------------------------------------- Multi Wound Chart Details Patient Name: Brooke Hopkins, Brooke Hopkins. Date of Service: 08/02/2016 8:00 AM Medical Record Number: [id number] Patient Account Number: [id number] Date of Birth/Sex: [birth year] (64 y.o. Female) Treating RN: Montey Hora Primary Care Physician: FITZGERALD, DAVID Other Clinician: Referring Physician: FITZGERALD, DAVID Treating Physician/Extender: Frann Rider in Treatment: 6 Vital Signs Height(in): 67 Pulse(bpm): 88 Weight(lbs): 300 Blood Pressure 181/57 (mmHg): Body Mass Index(BMI): 47 Temperature(F): 98.1 Respiratory Rate 20 (breaths/min): Photos: [N/A:N/A] Wound Location: Left Lower Leg - Right Lower Leg - N/A Circumfernential Circumfernential Wounding Event: Gradually Appeared Gradually Appeared N/A Primary Etiology: Lymphedema Lymphedema N/A Date Acquired: 12/31/2015 12/31/2015 N/A Weeks of Treatment: 6 6 N/A Wound Status:  Open Open N/A Clustered Wound: No Yes N/A Clustered Quantity: N/A 3 N/A Measurements L x W x D 3x7x0.2 19.7x15x0.3 N/A (cm) Area (cm) : 16.493 232.085 N/A Volume (cm) : 3.299 69.626 N/A % Reduction in Area: 83.60% -2.60% N/A % Reduction in Volume: 83.60% -53.90% N/A Classification: Partial Thickness Partial Thickness N/A Exudate Amount: Large Large N/A Exudate Type: Serous Serous N/A Exudate Color: amber amber N/A Wound Margin: Flat and Intact Indistinct, nonvisible N/A Granulation Amount: Medium (34-66%) Medium (34-66%) N/A Granulation Quality: Pink, Pale Red N/A CINDIA, FREGOSO. ([id number]) Necrotic Amount: Small (1-33%) Medium (34-66%) N/A Exposed Structures: Fascia: No Fascia: No N/A Fat: No Fat: No Tendon: No Tendon: No Muscle: No Muscle: No Joint: No Joint: No Bone: No Bone: No Limited to Skin Limited to Skin Breakdown Breakdown Epithelialization: None Small (1-33%) N/A Periwound Skin Texture: Edema: Yes Edema: Yes N/A Scarring: Yes Scarring: Yes Excoriation: No Excoriation: No Induration: No Induration: No Callus: No Callus: No Crepitus:  No Crepitus: No Fluctuance: No Fluctuance: No Friable: No Friable: No Rash: No Rash: No Periwound Skin Maceration: Yes Maceration: Yes N/A Moisture: Moist: Yes Moist: Yes Dry/Scaly: No Dry/Scaly: No Periwound Skin Color: Hemosiderin Staining: Yes Hemosiderin Staining: Yes N/A Atrophie Blanche: No Atrophie Blanche: No Cyanosis: No Cyanosis: No Ecchymosis: No Ecchymosis: No Erythema: No Erythema: No Mottled: No Mottled: No Pallor: No Pallor: No Rubor: No Rubor: No Temperature: No Abnormality No Abnormality N/A Tenderness on Yes Yes N/A Palpation: Wound Preparation: Ulcer Cleansing: Other: Ulcer Cleansing: Other: N/A soap and water soap and water Topical Anesthetic Topical Anesthetic Applied: Other: lidocaine Applied: Other: lidocaine 4% 4% Assessment Notes: N/A Heavy Green drainage N/A from wounds on right. Treatment Notes Electronic Signature(s) Signed: 08/02/2016 4:47:48 PM By: Montey Hora Entered By: Montey Hora on 08/02/2016 08:34:10 [name] ([id number]) -------------------------------------------------------------------------------- Multi-Disciplinary Care Plan Details Patient Name: Brooke Hopkins, Brooke Hopkins. Date of Service: 08/02/2016 8:00 AM Medical Record Number: [id number] Patient Account Number: [id number] Date of Birth/Sex: Apr 08, 1951 (64 y.o. Female) Treating RN: Montey Hora Primary Care Physician: FITZGERALD, DAVID Other Clinician: Referring Physician: FITZGERALD, DAVID Treating Physician/Extender: Frann Rider in Treatment: 6 Active Inactive Orientation to the Wound Care Program Nursing Diagnoses: Knowledge deficit related to the wound healing center program Goals: Patient/caregiver will verbalize understanding of the [facility] Date Initiated: 06/21/2016 Goal Status: Active Interventions: Provide education on orientation to the wound center Notes: Wound/Skin Impairment Nursing Diagnoses: Impaired  tissue integrity Goals: Patient/caregiver will verbalize understanding of skin care regimen Date Initiated: 06/21/2016 Goal Status: Active Ulcer/skin breakdown will have a volume reduction of 30% by week 4 Date Initiated: 06/21/2016 Goal Status: Active Ulcer/skin breakdown will have a volume reduction of 50% by week 8 Date Initiated: 06/21/2016 Goal Status: Active Ulcer/skin breakdown will have a volume reduction of 80% by week 12 Date Initiated: 06/21/2016 Goal Status: Active Ulcer/skin breakdown will heal within 14 weeks Date Initiated: 06/21/2016 Goal Status: Active Brooke Hopkins, Brooke Hopkins ([id number]) Interventions: Assess patient/caregiver ability to obtain necessary supplies Assess patient/caregiver ability to perform ulcer/skin care regimen upon admission and as needed Assess ulceration(s) every visit Provide education on ulcer and skin care Notes: Electronic Signature(s) Signed: 08/02/2016 4:47:48 PM By: Montey Hora Entered By: Montey Hora on 08/02/2016 08:34:01 [name] ([id number]) -------------------------------------------------------------------------------- Pain Assessment Details Patient Name: Brooke Hopkins, Brooke Hopkins. Date of Service: 08/02/2016 8:00 AM Medical Record Number: [id number] Patient Account Number: [id number] Date of Birth/Sex: [birth year] (64 y.o. Female) Treating RN: [name] Barman Primary Care Physician: FITZGERALD, DAVID Other Clinician: Referring  Physician: FITZGERALD, DAVID Treating Physician/Extender: Frann Rider in Treatment: 6 Active Problems Location of Pain Severity and Description of Pain Patient Has Paino No Site Locations Pain Management and Medication Current Pain Management: Electronic Signature(s) Signed: 08/02/2016 4:16:06 PM By: Gretta Cool, RN, BSN, Kim RN, BSN Entered By: Gretta Cool, RN, BSN, Kim on 08/02/2016 08:04:09 [name]  ([id number]) -------------------------------------------------------------------------------- Patient/Caregiver Education Details Patient Name: Brooke Hopkins, Brooke Hopkins. Date of Service: 08/02/2016 8:00 AM Medical Record Number: [id number] Patient Account Number: [id number] Date of Birth/Gender: 1951/02/14 (64 y.o. Female) Treating RN: Montey Hora Primary Care Physician: FITZGERALD, DAVID Other Clinician: Referring Physician: FITZGERALD, DAVID Treating Physician/Extender: Frann Rider in Treatment: 6 Education Assessment Education Provided To: Patient Education Topics Provided Basic Hygiene: Handouts: Other: lotrisone to skin folds Methods: Explain/Verbal Responses: State content correctly Electronic Signature(s) Signed: 08/02/2016 4:47:48 PM By: Montey Hora Entered By: Montey Hora on 08/02/2016 08:36:21 [name] ([id number]) -------------------------------------------------------------------------------- Wound Assessment Details Patient Name: Brooke Hopkins, Brooke Hopkins. Date of Service: 08/02/2016 8:00 AM Medical Record Number: [id number] Patient Account Number: [id number] Date of Birth/Sex: 27-Sep-1951 (64 y.o. Female) Treating RN: [name] Barman Primary Care Physician: FITZGERALD, DAVID Other Clinician: Referring Physician: FITZGERALD, DAVID Treating Physician/Extender: Frann Rider in Treatment: 6 Wound Status Wound Number: 1 Primary Etiology: Lymphedema Wound Location: Left Lower Leg - Wound Status: Open Circumfernential Wounding Event: Gradually Appeared Date Acquired: 12/31/2015 Weeks Of Treatment: 6 Clustered Wound: No Photos Wound Measurements Length: (cm) 3 Width: (cm) 7 Depth: (cm) 0.2 Area: (cm) 16.493 Volume: (cm) 3.299 % Reduction in Area: 83.6% % Reduction in Volume: 83.6% Epithelialization: None Wound Description Classification: Partial Thickness Wound Margin: Flat and Intact Exudate Amount: Large Exudate Type: Serous Exudate Color:  amber Foul Odor After Cleansing: No Wound Bed Granulation Amount: Medium (34-66%) Exposed Structure Granulation Quality: Pink, Pale Fascia Exposed: No Necrotic Amount: Small (1-33%) Fat Layer Exposed: No Necrotic Quality: Adherent Slough Tendon Exposed: No Brooke Hopkins, Brooke Hopkins ([id number]) Muscle Exposed: No Joint Exposed: No Bone Exposed: No Limited to Skin Breakdown Periwound Skin Texture Texture Color No Abnormalities Noted: No No Abnormalities Noted: No Callus: No Atrophie Blanche: No Crepitus: No Cyanosis: No Excoriation: No Ecchymosis: No Fluctuance: No Erythema: No Friable: No Hemosiderin Staining: Yes Induration: No Mottled: No Localized Edema: Yes Pallor: No Rash: No Rubor: No Scarring: Yes Temperature / Pain Moisture Temperature: No Abnormality No Abnormalities Noted: No Tenderness on Palpation: Yes Dry / Scaly: No Maceration: Yes Moist: Yes Wound Preparation Ulcer Cleansing: Other: soap and water, Topical Anesthetic Applied: Other: lidocaine 4%, Treatment Notes Wound #1 (Left, Circumferential Lower Leg) 1. Cleansed with: Clean wound with Normal Saline Cleanse wound with antibacterial soap and water 2. Anesthetic Topical Lidocaine 4% cream to wound bed prior to debridement 3. Peri-wound Care: Antifungal powder 4. Dressing Applied: Calcium Alginate with Silver 5. Secondary Dressing Applied ABD Pad 7. Secured with 3 Layer Compression System - Bilateral Notes x-sorb Electronic Signature(s) BRITTAN, PAVON ([id number]) Signed: 08/02/2016 4:16:06 PM By: Gretta Cool, RN, BSN, Kim RN, BSN Entered By: Gretta Cool, RN, BSN, Kim on 08/02/2016 08:19:21 [name] ([id number]) -------------------------------------------------------------------------------- Wound Assessment Details Patient Name: WANELL, [place]. Date of Service: 08/02/2016 8:00 AM Medical Record Number: [id number] Patient Account Number: [id number] Date of Birth/Sex: March 12, 1951 (64  y.o. Female) Treating RN: [name] Barman Primary Care Physician: FITZGERALD, DAVID Other Clinician: Referring Physician: FITZGERALD, DAVID Treating Physician/Extender: Frann Rider in Treatment: 6 Wound Status Wound Number: 2 Primary Etiology: Lymphedema Wound Location: Right Lower Leg - Wound Status: Open Circumfernential Wounding Event: Gradually  Appeared Date Acquired: 12/31/2015 Weeks Of Treatment: 6 Clustered Wound: Yes Photos Wound Measurements Length: (cm) 19.7 Width: (cm) 15 Depth: (cm) 0.3 Clustered Quantity: 3 Area: (cm) 232.085 Volume: (cm) 69.626 % Reduction in Area: -2.6% % Reduction in Volume: -53.9% Epithelialization: Small (1-33%) Tunneling: No Undermining: No Wound Description Classification: Partial Thickness Wound Margin: Indistinct, nonvisible Exudate Amount: Large Exudate Type: Serous Exudate Color: amber Foul Odor After Cleansing: No Wound Bed Granulation Amount: Medium (34-66%) Exposed Structure Granulation Quality: Red Fascia Exposed: No Necrotic Amount: Medium (34-66%) Fat Layer Exposed: No DAVISHA, GALLEGO ([id number]) Necrotic Quality: Adherent Slough Tendon Exposed: No Muscle Exposed: No Joint Exposed: No Bone Exposed: No Limited to Skin Breakdown Periwound Skin Texture Texture Color No Abnormalities Noted: No No Abnormalities Noted: No Callus: No Atrophie Blanche: No Crepitus: No Cyanosis: No Excoriation: No Ecchymosis: No Fluctuance: No Erythema: No Friable: No Hemosiderin Staining: Yes Induration: No Mottled: No Localized Edema: Yes Pallor: No Rash: No Rubor: No Scarring: Yes Temperature / Pain Moisture Temperature: No Abnormality No Abnormalities Noted: No Tenderness on Palpation: Yes Dry / Scaly: No Maceration: Yes Moist: Yes Wound Preparation Ulcer Cleansing: Other: soap and water, Topical Anesthetic Applied: Other: lidocaine 4%, Assessment Notes Heavy Green drainage from wounds on  right. Treatment Notes Wound #2 (Right, Circumferential Lower Leg) 1. Cleansed with: Clean wound with Normal Saline Cleanse wound with antibacterial soap and water 2. Anesthetic Topical Lidocaine 4% cream to wound bed prior to debridement 3. Peri-wound Care: Antifungal powder 4. Dressing Applied: Calcium Alginate with Silver 5. Secondary Dressing Applied ABD Pad 7. Secured with 3 Layer Compression System - Bilateral MINICA, RIGGSBY ([id number]) Notes x-sorb Electronic Signature(s) Signed: 08/02/2016 4:16:06 PM By: Gretta Cool, RN, BSN, Kim RN, BSN Entered By: Gretta Cool, RN, BSN, Kim on 08/02/2016 08:20:18 [name] ([id number]) -------------------------------------------------------------------------------- [place] Details Patient Name: ANNALIZA, WILLIG. Date of Service: 08/02/2016 8:00 AM Medical Record Number: [id number] Patient Account Number: [id number] Date of Birth/Sex: 05-04-1951 (64 y.o. Female) Treating RN: [name] Barman Primary Care Physician: FITZGERALD, DAVID Other Clinician: Referring Physician: FITZGERALD, DAVID Treating Physician/Extender: Frann Rider in Treatment: 6 Vital Signs Time Taken: 08:04 Temperature (F): 98.1 Height (in): 67 Pulse (bpm): 88 Weight (lbs): 300 Respiratory Rate (breaths/min): 20 Body Mass Index (BMI): 47 Blood Pressure (mmHg): 181/57 Reference Range: 80 - 120 mg / dl Electronic Signature(s) Signed: 08/02/2016 4:16:06 PM By: Gretta Cool, RN, BSN, Kim RN, BSN Entered By: Gretta Cool, RN, BSN, Kim on 08/02/2016 [id number]

## 2016-08-09 ENCOUNTER — Encounter: Payer: 59 | Admitting: Surgery

## 2016-08-09 DIAGNOSIS — I890 Lymphedema, not elsewhere classified: Secondary | ICD-10-CM | POA: Diagnosis not present

## 2016-08-09 DIAGNOSIS — L97222 Non-pressure chronic ulcer of left calf with fat layer exposed: Secondary | ICD-10-CM | POA: Diagnosis not present

## 2016-08-09 DIAGNOSIS — E11622 Type 2 diabetes mellitus with other skin ulcer: Secondary | ICD-10-CM | POA: Diagnosis not present

## 2016-08-09 DIAGNOSIS — L97212 Non-pressure chronic ulcer of right calf with fat layer exposed: Secondary | ICD-10-CM | POA: Diagnosis not present

## 2016-08-10 NOTE — Progress Notes (Signed)
Brooke Hopkins ([id number]) Visit Report for 08/09/2016 Arrival Information Details Patient Name: Brooke Hopkins, Brooke Hopkins. Date of Service: 08/09/2016 8:00 AM Medical Record Number: [id number] Patient Account Number: [id number] Date of Birth/Sex: 26-Jan-1951 (64 y.o. Female) Treating RN: Afful, RN, BSN, [facility] Primary Care Physician: FITZGERALD, DAVID Other Clinician: Referring Physician: FITZGERALD, DAVID Treating Physician/Extender: Frann Rider in Treatment: 7 Visit Information History Since Last Visit All ordered tests and consults were completed: No Patient Arrived: Wheel Chair Added or deleted any medications: No Arrival Time: 08:01 Any new allergies or adverse reactions: No Accompanied By: son Had a fall or experienced change in No activities of daily living that may affect Transfer Assistance: None risk of falls: Patient Identification Verified: Yes Signs or symptoms of abuse/neglect since last No Secondary Verification Process Yes visito Completed: Has Dressing in Place as Prescribed: Yes Patient Requires Transmission-Based No Has Compression in Place as Prescribed: Yes Precautions: Pain Present Now: No Patient Has Alerts: No Electronic Signature(s) Signed: 08/09/2016 2:56:07 PM By: Regan Lemming BSN, RN Entered By: Regan Lemming on 08/09/2016 08:02:06 Brooke Hopkins ([id number]) -------------------------------------------------------------------------------- Encounter Discharge Information Details Patient Name: Brooke Hopkins. Date of Service: 08/09/2016 8:00 AM Medical Record Number: [id number] Patient Account Number: [id number] Date of Birth/Sex: 08-08-1951 (64 y.o. Female) Treating RN: Afful, RN, BSN, [facility] Primary Care Physician: FITZGERALD, DAVID Other Clinician: Referring Physician: FITZGERALD, DAVID Treating Physician/Extender: Frann Rider in Treatment: 7 Encounter Discharge Information Items Discharge Pain Level: 0 Discharge Condition:  Stable Ambulatory Status: Wheelchair Discharge Destination: Home Transportation: Private Auto Accompanied By: son Schedule Follow-up Appointment: No Medication Reconciliation completed and provided to Patient/Care No Tyquisha Sharps: Provided on Clinical Summary of Care: 08/09/2016 Form Type Recipient Paper Patient LB Electronic Signature(s) Signed: 08/09/2016 8:50:45 AM By: Regan Lemming BSN, RN Previous Signature: 08/09/2016 8:37:28 AM Version By: Ruthine Dose Entered By: Regan Lemming on 08/09/2016 08:50:44 Brooke Hopkins ([id number]) -------------------------------------------------------------------------------- Lower Extremity Assessment Details Patient Name: Brooke Hopkins. Date of Service: 08/09/2016 8:00 AM Medical Record Number: [id number] Patient Account Number: [id number] Date of Birth/Sex: 06-23-1951 (64 y.o. Female) Treating RN: Afful, RN, BSN, [facility] Primary Care Physician: FITZGERALD, DAVID Other Clinician: Referring Physician: FITZGERALD, DAVID Treating Physician/Extender: Frann Rider in Treatment: 7 Edema Assessment Assessed: [Left: No] [Right: No] Edema: [Left: Yes] [Right: Yes] Calf Left: Right: Point of Measurement: 32 cm From Medial Instep 52.5 cm 54 cm Ankle Left: Right: Point of Measurement: 11 cm From Medial Instep 32.8 cm 32 cm Vascular Assessment Claudication: Claudication Assessment [Left:None] [Right:None] Pulses: Posterior Tibial Dorsalis Pedis Palpable: [Left:Yes] [Right:Yes] Extremity colors, hair growth, and conditions: Extremity Color: [Left:Hyperpigmented] [Right:Hyperpigmented] Hair Growth on Extremity: [Left:No] [Right:No] Temperature of Extremity: [Left:Warm] [Right:Warm] Capillary Refill: [Left:< 3 seconds] [Right:< 3 seconds] Electronic Signature(s) Signed: 08/09/2016 2:56:07 PM By: Regan Lemming BSN, RN Entered By: Regan Lemming on 08/09/2016 08:05:06 Brooke Hopkins  ([id number]) -------------------------------------------------------------------------------- Multi Wound Chart Details Patient Name: Brooke Hopkins. Date of Service: 08/09/2016 8:00 AM Medical Record Number: [id number] Patient Account Number: [id number] Date of Birth/Sex: [birth year] (64 y.o. Female) Treating RN: Montey Hora Primary Care Physician: FITZGERALD, DAVID Other Clinician: Referring Physician: FITZGERALD, DAVID Treating Physician/Extender: Frann Rider in Treatment: 7 Vital Signs Height(in): 67 Pulse(bpm): 67 Weight(lbs): 300 Blood Pressure 206/61 (mmHg): Body Mass Index(BMI): 47 Temperature(F): 98 Respiratory Rate 21 (breaths/min): Photos: [1:No Photos] [2:No Photos] [N/A:N/A] Wound Location: [1:Left Lower Leg - Circumfernential] [2:Right Lower Leg - Circumfernential] [N/A:N/A] Wounding Event: [1:Gradually Appeared] [2:Gradually Appeared] [N/A:N/A] Primary Etiology: [1:Lymphedema] [2:Lymphedema] [N/A:N/A] Date Acquired: [1:12/31/2015] [2:12/31/2015] [  N/A:N/A] Weeks of Treatment: [1:7] [2:7] [N/A:N/A] Wound Status: [1:Open] [2:Open] [N/A:N/A] Clustered Wound: [1:No] [2:Yes] [N/A:N/A] Clustered Quantity: [1:N/A] [2:3] [N/A:N/A] Measurements L x W x D 4x4.5x0.2 [2:20x21x0.3] [N/A:N/A] (cm) Area (cm) : [1:14.137] [2:329.867] [N/A:N/A] Volume (cm) : [1:2.827] [2:98.96] [N/A:N/A] % Reduction in Area: [1:85.90%] [2:-45.80%] [N/A:N/A] % Reduction in Volume: 85.90% [2:-118.70%] [N/A:N/A] Classification: [1:Partial Thickness] [2:Partial Thickness] [N/A:N/A] Exudate Amount: [1:Large] [2:Large] [N/A:N/A] Exudate Type: [1:Serous] [2:Serous] [N/A:N/A] Exudate Color: [1:amber] [2:amber] [N/A:N/A] Wound Margin: [1:Flat and Intact] [2:Indistinct, nonvisible] [N/A:N/A] Granulation Amount: [1:Medium (34-66%)] [2:Medium (34-66%)] [N/A:N/A] Granulation Quality: [1:Pink, Pale] [2:Red] [N/A:N/A] Necrotic Amount: [1:Small (1-33%)] [2:Medium (34-66%)] [N/A:N/A] Exposed  Structures: [1:Fascia: No Fat: No Tendon: No Muscle: No Joint: No Bone: No] [2:Fascia: No Fat: No Tendon: No Muscle: No Joint: No Bone: No] [N/A:N/A] Limited to Skin Limited to Skin Breakdown Breakdown Epithelialization: None Small (1-33%) N/A Periwound Skin Texture: Edema: Yes Edema: Yes N/A Scarring: Yes Scarring: Yes Excoriation: No Excoriation: No Induration: No Induration: No Callus: No Callus: No Crepitus: No Crepitus: No Fluctuance: No Fluctuance: No Friable: No Friable: No Rash: No Rash: No Periwound Skin Maceration: Yes Maceration: Yes N/A Moisture: Moist: Yes Moist: Yes Dry/Scaly: No Dry/Scaly: No Periwound Skin Color: Hemosiderin Staining: Yes Hemosiderin Staining: Yes N/A Atrophie Blanche: No Atrophie Blanche: No Cyanosis: No Cyanosis: No Ecchymosis: No Ecchymosis: No Erythema: No Erythema: No Mottled: No Mottled: No Pallor: No Pallor: No Rubor: No Rubor: No Temperature: No Abnormality No Abnormality N/A Tenderness on Yes Yes N/A Palpation: Wound Preparation: Ulcer Cleansing: Other: Ulcer Cleansing: Other: N/A soap and water soap and water Topical Anesthetic Topical Anesthetic Applied: Other: lidocaine Applied: Other: lidocaine 4% 4% Treatment Notes Electronic Signature(s) Signed: 08/09/2016 3:54:21 PM By: Montey Hora Entered By: Montey Hora on 08/09/2016 08:24:52 Brooke Hopkins ([id number]) -------------------------------------------------------------------------------- Multi-Disciplinary Care Plan Details Patient Name: EVALEIGH, DEESE. Date of Service: 08/09/2016 8:00 AM Medical Record Number: [id number] Patient Account Number: [id number] Date of Birth/Sex: [birth year] (64 y.o. Female) Treating RN: Montey Hora Primary Care Physician: FITZGERALD, DAVID Other Clinician: Referring Physician: FITZGERALD, DAVID Treating Physician/Extender: Frann Rider in Treatment: 7 Active Inactive Orientation to the Wound Care  Program Nursing Diagnoses: Knowledge deficit related to the wound healing center program Goals: Patient/caregiver will verbalize understanding of the [facility] Program Date Initiated: 06/21/2016 Goal Status: Active Interventions: Provide education on orientation to the wound center Notes: Wound/Skin Impairment Nursing Diagnoses: Impaired tissue integrity Goals: Patient/caregiver will verbalize understanding of skin care regimen Date Initiated: 06/21/2016 Goal Status: Active Ulcer/skin breakdown will have a volume reduction of 30% by week 4 Date Initiated: 06/21/2016 Goal Status: Active Ulcer/skin breakdown will have a volume reduction of 50% by week 8 Date Initiated: 06/21/2016 Goal Status: Active Ulcer/skin breakdown will have a volume reduction of 80% by week 12 Date Initiated: 06/21/2016 Goal Status: Active Ulcer/skin breakdown will heal within 14 weeks Date Initiated: 06/21/2016 Goal Status: Active ICOLE, PLACHTA ([id number]) Interventions: Assess patient/caregiver ability to obtain necessary supplies Assess patient/caregiver ability to perform ulcer/skin care regimen upon admission and as needed Assess ulceration(s) every visit Provide education on ulcer and skin care Notes: Electronic Signature(s) Signed: 08/09/2016 3:54:21 PM By: Montey Hora Entered By: Montey Hora on 08/09/2016 08:24:41 Brooke Hopkins ([id number]) -------------------------------------------------------------------------------- Pain Assessment Details Patient Name: RIYLEE, SOULLIERE. Date of Service: 08/09/2016 8:00 AM Medical Record Number: [id number] Patient Account Number: [id number] Date of Birth/Sex: 1951/10/21 (64 y.o. Female) Treating RN: Afful, RN, BSN, [facility] Primary Care Physician: FITZGERALD, DAVID Other Clinician: Referring Physician: Latah, DAVID Treating  Physician/Extender: Frann Rider in Treatment: 7 Active Problems Location of Pain Severity and  Description of Pain Patient Has Paino No Site Locations With Dressing Change: No Pain Management and Medication Current Pain Management: Electronic Signature(s) Signed: 08/09/2016 2:56:07 PM By: Regan Lemming BSN, RN Entered By: Regan Lemming on 08/09/2016 08:02:20 Brooke Hopkins ([id number]) -------------------------------------------------------------------------------- Patient/Caregiver Education Details Patient Name: ILIANY, PLOWDEN. Date of Service: 08/09/2016 8:00 AM Medical Record Number: [id number] Patient Account Number: [id number] Date of Birth/Gender: 04-20-1951 (64 y.o. Female) Treating RN: Afful, RN, BSN, [facility] Primary Care Physician: FITZGERALD, DAVID Other Clinician: Referring Physician: FITZGERALD, DAVID Treating Physician/Extender: Frann Rider in Treatment: 7 Education Assessment Education Provided To: Patient Education Topics Provided Welcome To The [facility]: Methods: Explain/Verbal Responses: State content correctly Wound/Skin Impairment: Methods: Explain/Verbal Responses: State content correctly Electronic Signature(s) Signed: 08/09/2016 2:56:07 PM By: Regan Lemming BSN, RN Entered By: Regan Lemming on 08/09/2016 08:51:05 Brooke Hopkins ([id number]) -------------------------------------------------------------------------------- Wound Assessment Details Patient Name: SHEBRA, DALOISIO. Date of Service: 08/09/2016 8:00 AM Medical Record Number: [id number] Patient Account Number: [id number] Date of Birth/Sex: 12-Apr-1951 (64 y.o. Female) Treating RN: Afful, RN, BSN, [facility] Primary Care Physician: FITZGERALD, DAVID Other Clinician: Referring Physician: FITZGERALD, DAVID Treating Physician/Extender: Frann Rider in Treatment: 7 Wound Status Wound Number: 1 Primary Etiology: Lymphedema Wound Location: Left Lower Leg - Wound Status: Open Circumfernential Wounding Event: Gradually Appeared Date Acquired: 12/31/2015 Weeks Of Treatment:  7 Clustered Wound: No Photos Photo Uploaded By: Regan Lemming on 08/09/2016 14:53:16 Wound Measurements Length: (cm) 4 Width: (cm) 4.5 Depth: (cm) 0.2 Area: (cm) 14.137 Volume: (cm) 2.827 % Reduction in Area: 85.9% % Reduction in Volume: 85.9% Epithelialization: None Tunneling: No Undermining: No Wound Description Classification: Partial Thickness Wound Margin: Flat and Intact Exudate Amount: Large Exudate Type: Serous KLAUDIA, SHIPLETT ([id number]) Foul Odor After Cleansing: No Exudate Color: amber Wound Bed Granulation Amount: Medium (34-66%) Exposed Structure Granulation Quality: Pink, Pale Fascia Exposed: No Necrotic Amount: Small (1-33%) Fat Layer Exposed: No Necrotic Quality: Adherent Slough Tendon Exposed: No Muscle Exposed: No Joint Exposed: No Bone Exposed: No Limited to Skin Breakdown Periwound Skin Texture Texture Color No Abnormalities Noted: No No Abnormalities Noted: No Callus: No Atrophie Blanche: No Crepitus: No Cyanosis: No Excoriation: No Ecchymosis: No Fluctuance: No Erythema: No Friable: No Hemosiderin Staining: Yes Induration: No Mottled: No Localized Edema: Yes Pallor: No Rash: No Rubor: No Scarring: Yes Temperature / Pain Moisture Temperature: No Abnormality No Abnormalities Noted: No Tenderness on Palpation: Yes Dry / Scaly: No Maceration: Yes Moist: Yes Wound Preparation Ulcer Cleansing: Other: soap and water, Topical Anesthetic Applied: Other: lidocaine 4%, Treatment Notes Wound #1 (Left, Circumferential Lower Leg) 1. Cleansed with: Clean wound with Normal Saline Cleanse wound with antibacterial soap and water 2. Anesthetic Topical Lidocaine 4% cream to wound bed prior to debridement 3. Peri-wound Care: Antifungal powder 4. Dressing Applied: Calcium Alginate with Silver 5. Secondary Dressing Applied ZANIYLAH, BURUCA. ([id number]) ABD Pad 7. Secured with 3 Layer Compression System -  Bilateral Notes x-sorb Electronic Signature(s) Signed: 08/09/2016 2:56:07 PM By: Regan Lemming BSN, RN Entered By: Regan Lemming on 08/09/2016 08:19:34 Brooke Hopkins ([id number]) -------------------------------------------------------------------------------- Wound Assessment Details Patient Name: QUANTAYA, SHINDER. Date of Service: 08/09/2016 8:00 AM Medical Record Number: [id number] Patient Account Number: [id number] Date of Birth/Sex: 03/12/1951 (64 y.o. Female) Treating RN: Afful, RN, BSN, [facility] Primary Care Physician: FITZGERALD, DAVID Other Clinician: Referring Physician: FITZGERALD, DAVID Treating Physician/Extender: Frann Rider in Treatment: 7 Wound Status Wound  Number: 2 Primary Etiology: Lymphedema Wound Location: Right Lower Leg - Wound Status: Open Circumfernential Wounding Event: Gradually Appeared Date Acquired: 12/31/2015 Weeks Of Treatment: 7 Clustered Wound: Yes Photos Photo Uploaded By: Regan Lemming on 08/09/2016 14:52:52 Wound Measurements Length: (cm) 20 Width: (cm) 21 Depth: (cm) 0.3 Clustered Quantity: 3 Area: (cm) 329.867 Volume: (cm) 98.96 % Reduction in Area: -45.8% % Reduction in Volume: -118.7% Epithelialization: Small (1-33%) Tunneling: No Wound Description Classification: Partial Thickness Wound Margin: Indistinct, nonvisible Exudate Amount: Large LANEE, MAZZIO ([id number]) Foul Odor After Cleansing: No Exudate Type: Serous Exudate Color: amber Wound Bed Granulation Amount: Medium (34-66%) Exposed Structure Granulation Quality: Red Fascia Exposed: No Necrotic Amount: Medium (34-66%) Fat Layer Exposed: No Necrotic Quality: Adherent Slough Tendon Exposed: No Muscle Exposed: No Joint Exposed: No Bone Exposed: No Limited to Skin Breakdown Periwound Skin Texture Texture Color No Abnormalities Noted: No No Abnormalities Noted: No Callus: No Atrophie Blanche: No Crepitus: No Cyanosis: No Excoriation:  No Ecchymosis: No Fluctuance: No Erythema: No Friable: No Hemosiderin Staining: Yes Induration: No Mottled: No Localized Edema: Yes Pallor: No Rash: No Rubor: No Scarring: Yes Temperature / Pain Moisture Temperature: No Abnormality No Abnormalities Noted: No Tenderness on Palpation: Yes Dry / Scaly: No Maceration: Yes Moist: Yes Wound Preparation Ulcer Cleansing: Other: soap and water, Topical Anesthetic Applied: Other: lidocaine 4%, Treatment Notes Wound #2 (Right, Circumferential Lower Leg) 1. Cleansed with: Clean wound with Normal Saline Cleanse wound with antibacterial soap and water 2. Anesthetic Topical Lidocaine 4% cream to wound bed prior to debridement 3. Peri-wound Care: Antifungal powder 4. Dressing Applied: Calcium Alginate with Silver QUINETTE, CHRISTOFFER. ([id number]) 5. Secondary Dressing Applied ABD Pad 7. Secured with 3 Layer Compression System - Bilateral Notes x-sorb Electronic Signature(s) Signed: 08/09/2016 2:56:07 PM By: Regan Lemming BSN, RN Entered By: Regan Lemming on 08/09/2016 08:24:15 Brooke Hopkins ([id number]) -------------------------------------------------------------------------------- Vitals Details Patient Name: DYLEN, BOVEN. Date of Service: 08/09/2016 8:00 AM Medical Record Number: [id number] Patient Account Number: [id number] Date of Birth/Sex: 1951-12-12 (64 y.o. Female) Treating RN: Afful, RN, BSN, [facility] Primary Care Physician: FITZGERALD, DAVID Other Clinician: Referring Physician: FITZGERALD, DAVID Treating Physician/Extender: Frann Rider in Treatment: 7 Vital Signs Time Taken: 08:02 Temperature (F): 98 Height (in): 67 Pulse (bpm): 67 Weight (lbs): 300 Respiratory Rate (breaths/min): 21 Body Mass Index (BMI): 47 Blood Pressure (mmHg): 206/61 Reference Range: 80 - 120 mg / dl Electronic Signature(s) Signed: 08/09/2016 2:56:07 PM By: Regan Lemming BSN, RN Entered By: Regan Lemming on 08/09/2016  08:08:13

## 2016-08-10 NOTE — Progress Notes (Signed)
Brooke, Hopkins ([id number]) Visit Report for 08/09/2016 Chief Complaint Document Details Patient Name: Brooke Hopkins, Brooke Hopkins. Date of Service: 08/09/2016 8:00 AM Medical Record Patient Account Number: [id number] Number: Afful, RN, BSN, Treating RN: 1951/12/16 [phone] y.o. Brooke Hopkins Date of Birth/Sex: Female) Other Clinician: Primary Care Physician: FITZGERALD, DAVID Treating Christin Fudge Referring Physician: FITZGERALD, DAVID Physician/Extender: Suella Grove in Treatment: 7 Information Obtained from: Patient Chief Complaint Patients presents for treatment of an open diabetic ulcer and significant lymphedema which she's had for about two years Electronic Signature(s) Signed: 08/09/2016 8:47:19 AM By: Christin Fudge MD, FACS Entered By: Christin Fudge on 08/09/2016 08:47:19 [name] ([id number]) -------------------------------------------------------------------------------- HPI Details Patient Name: [name], Hopkins. Date of Service: 08/09/2016 8:00 AM Medical Record Patient Account Number: [id number] Number: Afful, RN, BSN, Treating RN: 1951-11-24 [phone] y.o. Brooke Hopkins Date of Birth/Sex: Female) Other Clinician: Primary Care Physician: FITZGERALD, DAVID Treating Christin Fudge Referring Physician: FITZGERALD, DAVID Physician/Extender: Weeks in Treatment: 7 History of Present Illness Location: massive swelling of both lower extremities and ulceration both lower extremities Quality: Patient reports experiencing a dull pain to affected area(s). Severity: Patient states wound are getting worse. Duration: Patient has had the wound for >2 years prior to seeking treatment at the wound center Timing: Pain in wound is constant (hurts all the time) Context: The wound appeared gradually over time Modifying Factors: Other treatment(s) tried include:she has a lymphedema pump but uses it seldom and she's had several course of antibiotics Associated Signs and Symptoms: Patient  reports having difficulty standing for long periods. HPI Description: 66 year old patient seen by Dr. Ola Spurr of infectious disease who has been following up for left lower extremity cellulitis and ulcer with recurrent bilateral lower extremity problems for several months. Recently she had a large right lower extremity bullae which opened out and has been ulcerated. She has seen the vascular group and has been getting Unna's wraps and has a lymphedema pump used in the past. Her prior cultures were positive for Pseudomonas, Proteus and was treated with amoxicillin. He has also been treated with 2 weeks course of ciprofloxacin and amoxicillin.. Increase of Lasix dose helped with the edema and echo showed no systolic CHF but may be diastolic problems. Past medical history significant for diabetes mellitus type 2, venous stasis ulcer, obesity, diabetic peripheral neuropathy, status post back surgery, cholecystectomy, hysterectomy, arthroscopy of the knee. He is a former smoker and quit smoking in 1984. The patient has seen Dr. Delana Meyer who did not recommend any arterial or venous duplex studies and has been using Unna's wraps and also recommended a lymphedema pump. she has been very noncompliant with using these. 06/28/2016 -- the patient is still on antibiotics as prescribed by Dr. Ola Spurr and he is asked her to take it for 3 weeks. The patient also says she has a lot of redness and pain in the folds of her thigh and lower extremity and this is very painful. 07/26/2016 -- the patient is off antibiotics and has been getting dressing changes 3 times a week. 08/09/2016 -- is been on Cipro and is taking potassium supplements along with her Lasix and is going to be seeing Dr. Ola Spurr for a consult return visit only in November 2017 Electronic Signature(s) Signed: 08/09/2016 8:48:00 AM By: Christin Fudge MD, FACS Entered By: Christin Fudge on 08/09/2016 08:48:00 MARGUERITTE, UTTERBACK  ([id number]) MIKAYELA, MENTZEL ([id number]) -------------------------------------------------------------------------------- Physical Exam Details Patient Name: Brooke, Hopkins. Date of Service: 08/09/2016 8:00 AM Medical Record Patient Account Number: [id number] Number:  Afful, RN, BSN, Treating RN: 1951/10/13 [phone] y.o. Brooke Hopkins Date of Birth/Sex: Female) Other Clinician: Primary Care Physician: FITZGERALD, DAVID Treating Christin Fudge Referring Physician: FITZGERALD, DAVID Physician/Extender: Weeks in Treatment: 7 Constitutional . Pulse regular. Respirations normal and unlabored. Afebrile. . Eyes Nonicteric. Reactive to light. Ears, Nose, Mouth, and Throat Lips, teeth, and gums WNL.Marland Kitchen Moist mucosa without lesions. Neck supple and nontender. No palpable supraclavicular or cervical adenopathy. Normal sized without goiter. Respiratory WNL. No retractions.. Breath sounds WNL, No rubs, rales, rhonchi, or wheeze.. Cardiovascular Heart rhythm and rate regular, no murmur or gallop.. Pedal Pulses WNL. No clubbing, cyanosis or edema. Chest Breasts symmetical and no nipple discharge.. Breast tissue WNL, no masses, lumps, or tenderness.. Lymphatic No adneopathy. No adenopathy. No adenopathy. Musculoskeletal Adexa without tenderness or enlargement.. Digits and nails w/o clubbing, cyanosis, infection, petechiae, ischemia, or inflammatory conditions.. Integumentary (Hair, Skin) No suspicious lesions. No crepitus or fluctuance. No peri-wound warmth or erythema. No masses.Marland Kitchen Psychiatric Judgement and insight Intact.. No evidence of depression, anxiety, or agitation.. Notes the edema is still persistent and a lot of weeping from her right lower extremity. Was still some and is of fungal infection in the skin creases. No sharp debridement is required today. Electronic Signature(s) Signed: 08/09/2016 8:49:38 AM By: Christin Fudge MD, FACS Entered By: Christin Fudge on 08/09/2016  08:49:38 [name] ([id number]) -------------------------------------------------------------------------------- Physician Orders Details Patient Name: PAULETTA, RISCH. Date of Service: 08/09/2016 8:00 AM Medical Record Number: [id number] Patient Account Number: [id number] Date of Birth/Sex: Nov 23, 1951 (64 y.o. Female) Treating RN: Montey Hora Primary Care Physician: FITZGERALD, DAVID Other Clinician: Referring Physician: FITZGERALD, DAVID Treating Physician/Extender: Frann Rider in Treatment: 7 Verbal / Phone Orders: Yes Clinician: Montey Hora Read Back and Verified: No Diagnosis Coding Wound Cleansing Wound #1 Left,Circumferential Lower Leg o Cleanse wound with mild soap and water o May Shower, gently pat wound dry prior to applying new dressing. o May shower with protection. Wound #2 Right,Circumferential Lower Leg o Cleanse wound with mild soap and water o May Shower, gently pat wound dry prior to applying new dressing. o May shower with protection. Anesthetic Wound #1 Left,Circumferential Lower Leg o Topical Lidocaine 4% cream applied to wound bed prior to debridement Wound #2 Right,Circumferential Lower Leg o Topical Lidocaine 4% cream applied to wound bed prior to debridement Skin Barriers/Peri-Wound Care Wound #1 Left,Circumferential Lower Leg o Other: - Lotrisone cream in folds at knees and on reddened areas on legs (Nystatin used in clinic) Wound #2 Right,Circumferential Lower Leg o Other: - Lotrisone cream in folds at knees and on reddened areas on legs (Nystatin used in clinic) Primary Wound Dressing Wound #1 Left,Circumferential Lower Leg o Aquacel Ag Wound #2 Right,Circumferential Lower Leg o Aquacel Ag Secondary Dressing Wound #1 Left,Circumferential Lower Leg o ABD pad o ESSYNCE, OZIER. ([id number]) Wound #2 Right,Circumferential Lower Leg o ABD pad o XtraSorb Dressing Change  Frequency Wound #1 Left,Circumferential Lower Leg o Change Dressing Monday, Wednesday, Friday Wound #2 Right,Circumferential Lower Leg o Change Dressing Monday, Wednesday, Friday Follow-up Appointments Wound #1 Left,Circumferential Lower Leg o Return Appointment in 1 week. Wound #2 Right,Circumferential Lower Leg o Return Appointment in 1 week. Edema Control Wound #1 Left,Circumferential Lower Leg o 3 Layer Compression System - Bilateral - May anchor top of wrap with dome paste, but only wrap around leg one time. o Elevate legs to the level of the heart and pump ankles as often as possible Wound #2 Right,Circumferential Lower Leg o 3 Layer Compression System -  Bilateral - May anchor top of wrap with dome paste, but only wrap around leg one time. o Elevate legs to the level of the heart and pump ankles as often as possible Home Health Wound #1 Left,Circumferential Lower Leg o [place] Visits - [place] Nurse may visit PRN to address patientos wound care needs. o FACE TO FACE ENCOUNTER: MEDICARE and MEDICAID PATIENTS: I certify that this patient is under my care and that I had a face-to-face encounter that meets the physician face-to-face encounter requirements with this patient on this date. The encounter with the patient was in whole or in part for the following MEDICAL CONDITION: (primary reason for [place]) MEDICAL NECESSITY: I certify, that based on my findings, NURSING services are a medically necessary home health service. HOME BOUND STATUS: I certify that my clinical findings support that this patient is homebound (i.e., Due to illness or injury, pt requires aid of supportive devices such as crutches, cane, wheelchairs, walkers, the use of special transportation or the assistance of another person to leave their place of residence. There is a normal inability to leave the home and doing so requires considerable and taxing  effort. Other absences are for medical reasons / religious services and are infrequent or of short duration when for other reasons). o Please direct any NON-WOUND related issues/requests for orders to patient's Primary Care Physician BERNEDA, VOCKE ([id number]) Wound #2 Right,Circumferential Lower Leg o [place] Nurse may visit PRN to address patientos wound care needs. o FACE TO FACE ENCOUNTER: MEDICARE and MEDICAID PATIENTS: I certify that this patient is under my care and that I had a face-to-face encounter that meets the physician face-to-face encounter requirements with this patient on this date. The encounter with the patient was in whole or in part for the following MEDICAL CONDITION: (primary reason for [place]) MEDICAL NECESSITY: I certify, that based on my findings, NURSING services are a medically necessary home health service. HOME BOUND STATUS: I certify that my clinical findings support that this patient is homebound (i.e., Due to illness or injury, pt requires aid of supportive devices such as crutches, cane, wheelchairs, walkers, the use of special transportation or the assistance of another person to leave their place of residence. There is a normal inability to leave the home and doing so requires considerable and taxing effort. Other absences are for medical reasons / religious services and are infrequent or of short duration when for other reasons). o Please direct any NON-WOUND related issues/requests for orders to patient's Primary Care Physician Medications-please add to medication list. Wound #2 Right,Circumferential Lower Leg o P.O. Antibiotics - Cipro Electronic Signature(s) Signed: 08/09/2016 3:26:16 PM By: Christin Fudge MD, FACS Signed: 08/09/2016 3:54:21 PM By: Montey Hora Entered By: Montey Hora on 08/09/2016 08:26:20 [name]  ([id number]) -------------------------------------------------------------------------------- Problem List Details Patient Name: HIMA, LINNEY. Date of Service: 08/09/2016 8:00 AM Medical Record Patient Account Number: [id number] Number: Afful, RN, BSN, Treating RN: [birth year] [phone] y.o. 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 Date of Service: 08/09/2016 8:00 AM Medical Record Patient Account Number: [id number] Number: Afful, RN, BSN, Treating RN: May 19, 1951 [phone] y.o. Brooke Hopkins Date of Birth/Sex: Female) Other Clinician: Primary Care Physician: FITZGERALD, DAVID Treating Christin Fudge Referring Physician: FITZGERALD, DAVID Physician/Extender: Weeks in Treatment: 7 Subjective Chief Complaint Information obtained from Patient Patients presents for treatment of an open diabetic ulcer and significant lymphedema which she's had for about two years History of Present Illness (HPI) The following HPI  elements were documented for the patient's wound: Location: massive swelling of both lower extremities and ulceration both lower extremities Quality: Patient reports experiencing a dull pain to affected area(s). Severity: Patient states wound are getting worse. Duration: Patient has had the wound for >2 years prior to seeking treatment at the wound center Timing: Pain in wound is constant (hurts all the time) Context: The wound appeared gradually over time Modifying Factors: Other treatment(s) tried include:she has a lymphedema pump but uses it seldom and she's had several course of antibiotics Associated Signs and Symptoms: Patient reports having difficulty standing for long periods. 65 year old patient seen by Dr. Ola Spurr of infectious disease who has been following up for left lower extremity cellulitis and ulcer with recurrent bilateral lower extremity problems for several months. Recently she had a large right lower extremity bullae which opened out and has been ulcerated. She has seen the vascular group and has been getting Unna's wraps and has a lymphedema pump used in the past. Her prior cultures were positive for Pseudomonas, Proteus and was treated with amoxicillin. He has also been treated with 2 weeks course of ciprofloxacin and amoxicillin.. Increase of Lasix dose helped with the edema and echo showed no systolic CHF but may be diastolic problems. Past medical history significant for diabetes mellitus type 2, venous stasis ulcer, obesity, diabetic peripheral neuropathy, status post back surgery, cholecystectomy, hysterectomy, arthroscopy of the knee. He is a former smoker and quit smoking in 1984. The patient has seen Dr. Delana Meyer who did not recommend any arterial or venous duplex studies and has been using Unna's wraps and also recommended a lymphedema pump. she has been very noncompliant with using these. 06/28/2016 -- the patient is still on antibiotics as prescribed by Dr.  Ola Spurr and he is asked her to take it for 3 weeks. The patient also says she has a lot of redness and pain in the folds of her thigh and lower extremity and this is very painful. ALEIJAH, RUNNELS ([id number]) 07/26/2016 -- the patient is off antibiotics and has been getting dressing changes 3 times a week. 08/09/2016 -- is been on Cipro and is taking potassium supplements along with her Lasix and is going to be seeing Dr. Ola Spurr for a consult return visit only in November 2017 Objective Constitutional Pulse regular. Respirations normal and unlabored. Afebrile. Vitals Time Taken: 8:02 AM, Height: 67 in, Weight: 300 lbs, BMI: 47, Temperature: 98 F, Pulse: 67 bpm, Respiratory Rate: 21 breaths/min, Blood Pressure: 206/61 mmHg. Eyes Nonicteric. Reactive to light. Ears, Nose, Mouth, and Throat Lips, teeth, and gums WNL.Marland Kitchen Moist mucosa without lesions. Neck supple and nontender. No palpable supraclavicular or cervical adenopathy. Normal sized without goiter. Respiratory WNL. No retractions.. Breath sounds WNL, No rubs,  rales, rhonchi, or wheeze.. Cardiovascular Heart rhythm and rate regular, no murmur or gallop.. Pedal Pulses WNL. No clubbing, cyanosis or edema. Chest Breasts symmetical and no nipple discharge.. Breast tissue WNL, no masses, lumps, or tenderness.. Lymphatic No adneopathy. No adenopathy. No adenopathy. Musculoskeletal Adexa without tenderness or enlargement.. Digits and nails w/o clubbing, cyanosis, infection, petechiae, ischemia, or inflammatory conditions.Marland Kitchen Psychiatric Judgement and insight Intact.. No evidence of depression, anxiety, or agitation.. General Notes: the edema is still persistent and a lot of weeping from her right lower extremity. Was still some and is of fungal infection in the skin creases. No sharp debridement is required today. TANAYIA, [id number] ([id number]) Integumentary (Hair, Skin) No suspicious lesions. No crepitus or fluctuance. No  peri-wound warmth or erythema. No masses.. Wound #1 status is Open. Original cause of wound was Gradually Appeared. The wound is located on the Left,Circumferential Lower Leg. The wound measures 4cm length x 4.5cm width x 0.2cm depth; 14.137cm^2 area and 2.827cm^3 volume. The wound is limited to skin breakdown. There is no tunneling or undermining noted. There is a large amount of serous drainage noted. The wound margin is flat and intact. There is medium (34-66%) pink, pale granulation within the wound bed. There is a small (1-33%) amount of necrotic tissue within the wound bed including Adherent Slough. The periwound skin appearance exhibited: Localized Edema, Scarring, Maceration, Moist, Hemosiderin Staining. The periwound skin appearance did not exhibit: Callus, Crepitus, Excoriation, Fluctuance, Friable, Induration, Rash, Dry/Scaly, Atrophie Blanche, Cyanosis, Ecchymosis, Mottled, Pallor, Rubor, Erythema. Periwound temperature was noted as No Abnormality. The periwound has tenderness on palpation. Wound #2 status is Open. Original cause of wound was Gradually Appeared. The wound is located on the Right,Circumferential Lower Leg. The wound measures 20cm length x 21cm width x 0.3cm depth; 329.867cm^2 area and 98.96cm^3 volume. The wound is limited to skin breakdown. There is no tunneling noted. There is a large amount of serous drainage noted. The wound margin is indistinct and nonvisible. There is medium (34-66%) red granulation within the wound bed. There is a medium (34-66%) amount of necrotic tissue within the wound bed including Adherent Slough. The periwound skin appearance exhibited: Localized Edema, Scarring, Maceration, Moist, Hemosiderin Staining. The periwound skin appearance did not exhibit: Callus, Crepitus, Excoriation, Fluctuance, Friable, Induration, Rash, Dry/Scaly, Atrophie Blanche, Cyanosis, Ecchymosis, Mottled, Pallor, Rubor, Erythema. Periwound temperature was noted as No  Abnormality. The periwound has tenderness on palpation. 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 May shower with protection. Wound #2 Right,Circumferential Lower Leg: Cleanse wound with mild soap and water May Shower, gently pat wound dry prior to applying new dressing. May shower with protection. Anesthetic: Wound #1 Left,Circumferential Lower Leg: Topical Lidocaine 4% cream applied to wound bed prior to debridement Wound #2 Right,Circumferential Lower Leg: Topical Lidocaine 4% cream applied to wound bed prior to debridement Skin Barriers/Peri-Wound Care: Wound #1 Left,Circumferential Lower Leg: Other: - Lotrisone cream in folds at knees and on reddened areas on legs (Nystatin used in clinic) Wound #2 Right,Circumferential Lower Leg: Other: -  Lotrisone cream in folds at knees and on reddened areas on legs (Nystatin used in clinic) Primary Wound Dressing: Wound #1 Left,Circumferential Lower Leg: Aquacel Ag Wound #2 Right,Circumferential Lower Leg: Aquacel Ag Secondary Dressing: Wound #1 Left,Circumferential Lower Leg: ABD pad XtraSorb Wound #2 Right,Circumferential Lower Leg: ABD pad XtraSorb Dressing Change Frequency: Wound #1 Left,Circumferential Lower Leg: Change Dressing Monday, Wednesday, Friday Wound #2 Right,Circumferential Lower Leg: Change Dressing Monday, Wednesday, Friday Follow-up Appointments: Wound #1 Left,Circumferential Lower  Leg: Return Appointment in 1 week. Wound #2 Right,Circumferential Lower Leg: Return Appointment in 1 week. Edema Control: Wound #1 Left,Circumferential Lower Leg: 3 Layer Compression System - Bilateral - May anchor top of wrap with dome paste, but only wrap around leg one time. Elevate legs to the level of the heart and pump ankles as often as possible Wound #2 Right,Circumferential Lower Leg: 3 Layer Compression System - Bilateral - May anchor top of wrap with dome paste, but only wrap around leg one time. Elevate legs to the level of the heart and pump ankles as often as possible Home Health: Wound #1 Left,Circumferential Lower Leg: Continue Home Health Visits - [place] Nurse may visit PRN to address patient s wound care needs. [name], [id number] ([id number]) FACE TO FACE ENCOUNTER: MEDICARE and MEDICAID PATIENTS: I certify that this patient is under my care and that I had a face-to-face encounter that meets the physician face-to-face encounter requirements with this patient on this date. The encounter with the patient was in whole or in part for the following MEDICAL CONDITION: (primary reason for [place]) MEDICAL NECESSITY: I certify, that based on my findings, NURSING services are a medically necessary home health service. HOME BOUND STATUS: I certify that my clinical findings support that this patient is homebound (i.e., Due to illness or injury, pt requires aid of supportive devices such as crutches, cane, wheelchairs, walkers, the use of special transportation or the assistance of another person to leave their place of residence. There is a normal inability to leave the home and doing so requires considerable and taxing effort. Other absences are for medical reasons / religious services and are infrequent or of short duration when for other reasons). Please direct any NON-WOUND related issues/requests for orders to patient's Primary Care Physician Wound #2  Right,Circumferential Lower Leg: [place] Nurse may visit PRN to address patient s wound care needs. FACE TO FACE ENCOUNTER: MEDICARE and MEDICAID PATIENTS: I certify that this patient is under my care and that I had a face-to-face encounter that meets the physician face-to-face encounter requirements with this patient on this date. The encounter with the patient was in whole or in part for the following MEDICAL CONDITION: (primary reason for [place]) MEDICAL NECESSITY: I certify, that based on my findings, NURSING services are a medically necessary home health service. HOME BOUND STATUS: I certify that my clinical findings support that this patient is homebound (i.e., Due to illness or injury, pt requires aid of supportive devices such as crutches, cane, wheelchairs, walkers, the use of special transportation or the assistance of another person to leave their place of residence. There is a normal inability to leave the home and doing so requires considerable and taxing effort. Other absences are for medical reasons / religious services and are infrequent or of short duration when for other reasons). Please direct any NON-WOUND related issues/requests for orders to patient's Primary Care Physician Medications-please add to medication list.: Wound #2 Right,Circumferential Lower Leg: P.O. Antibiotics -  Cipro I recommended: 1. Silver alginate to the wounds and Profore lite to be applied to both lower extremities and change 3 times a week. 2. I have put her empirically on ciprofloxacin 500 mg twice a day for 2 weeks. 3. Now that Dr. Ola Spurr is back I have asked her to book an appointment to see him 4. I have put her on Lotrisone cream to be applied daily to the areas of fungal infection in the popliteal fossa area 5. Elevation and exercise 6. Good control of her diabetes mellitus. 7. Regular visits to the wound center. 8. using a lymphedema pumps for  an hour each twice a day SADHANA, CINTORA ([id number]) Electronic Signature(s) Signed: 08/09/2016 8:50:50 AM By: Christin Fudge MD, FACS Entered By: Christin Fudge on 08/09/2016 08:50:50 ALFIE, SVETLIK ([id number]) -------------------------------------------------------------------------------- SuperBill Details Patient Name: AMERICAS, VANNOTE. Date of Service: 08/09/2016 Medical Record Number: [id number] Patient Account Number: [id number] Date of Birth/Sex: 1951/12/22 (64 y.o. Female) Treating RN: Montey Hora Primary Care Physician: FITZGERALD, DAVID Other Clinician: Referring Physician: FITZGERALD, DAVID Treating Physician/Extender: Frann Rider in Treatment: 7 Diagnosis Coding ICD-10 Codes Code Description E11.622 Type 2 diabetes mellitus with other skin ulcer I89.0 Lymphedema, not elsewhere classified L97.222 Non-pressure chronic ulcer of left calf with fat layer exposed L97.212 Non-pressure chronic ulcer of right calf with fat layer exposed E66.01 Morbid (severe) obesity due to excess calories B37.2 Candidiasis of skin and nail Facility Procedures CPT4: Description Modifier Quantity Code [id number] BILATERAL: Application of multi-layer venous compression 1 system; leg (below knee), including ankle and foot. Physician Procedures CPT4 Code: [id number] Description: [id number] - WC PHYS LEVEL 3 - EST PT ICD-10 Description Diagnosis E11.622 Type 2 diabetes mellitus with other skin ulcer I89.0 Lymphedema, not elsewhere classified L97.222 Non-pressure chronic ulcer of left calf with fat 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 Modifier: layer exposed layer exposed Quantity: 1 Electronic Signature(s) Signed: 08/09/2016 8:51:04 AM By: Christin Fudge MD, FACS Entered By: Christin Fudge on 08/09/2016 08:51:04

## 2016-08-16 ENCOUNTER — Inpatient Hospital Stay
Admission: EM | Admit: 2016-08-16 | Discharge: 2016-08-19 | DRG: 603 | Disposition: A | Discharge status: Home / Self Care | Payer: 59 | Attending: Internal Medicine | Admitting: Internal Medicine

## 2016-08-16 ENCOUNTER — Encounter (HOSPITAL_BASED_OUTPATIENT_CLINIC_OR_DEPARTMENT_OTHER): Payer: 59 | Admitting: General Surgery

## 2016-08-16 ENCOUNTER — Encounter: Payer: Self-pay | Admitting: Emergency Medicine

## 2016-08-16 DIAGNOSIS — L97219 Non-pressure chronic ulcer of right calf with unspecified severity: Principal | ICD-10-CM

## 2016-08-16 DIAGNOSIS — Z90710 Acquired absence of both cervix and uterus: Secondary | ICD-10-CM | POA: Diagnosis not present

## 2016-08-16 DIAGNOSIS — I83212 Varicose veins of right lower extremity with both ulcer of calf and inflammation: Secondary | ICD-10-CM

## 2016-08-16 DIAGNOSIS — Z8673 Personal history of transient ischemic attack (TIA), and cerebral infarction without residual deficits: Secondary | ICD-10-CM

## 2016-08-16 DIAGNOSIS — L03116 Cellulitis of left lower limb: Secondary | ICD-10-CM | POA: Diagnosis not present

## 2016-08-16 DIAGNOSIS — L03115 Cellulitis of right lower limb: Principal | ICD-10-CM | POA: Diagnosis present

## 2016-08-16 DIAGNOSIS — E1122 Type 2 diabetes mellitus with diabetic chronic kidney disease: Secondary | ICD-10-CM | POA: Diagnosis present

## 2016-08-16 DIAGNOSIS — E11622 Type 2 diabetes mellitus with other skin ulcer: Secondary | ICD-10-CM | POA: Diagnosis present

## 2016-08-16 DIAGNOSIS — R600 Localized edema: Secondary | ICD-10-CM | POA: Diagnosis present

## 2016-08-16 DIAGNOSIS — Z1630 Resistance to unspecified antimicrobial drugs: Secondary | ICD-10-CM | POA: Diagnosis present

## 2016-08-16 DIAGNOSIS — T148XXA Other injury of unspecified body region, initial encounter: Secondary | ICD-10-CM

## 2016-08-16 DIAGNOSIS — Z993 Dependence on wheelchair: Secondary | ICD-10-CM | POA: Diagnosis not present

## 2016-08-16 DIAGNOSIS — Z833 Family history of diabetes mellitus: Secondary | ICD-10-CM

## 2016-08-16 DIAGNOSIS — B372 Candidiasis of skin and nail: Secondary | ICD-10-CM | POA: Diagnosis not present

## 2016-08-16 DIAGNOSIS — L97212 Non-pressure chronic ulcer of right calf with fat layer exposed: Secondary | ICD-10-CM | POA: Diagnosis not present

## 2016-08-16 DIAGNOSIS — Z7982 Long term (current) use of aspirin: Secondary | ICD-10-CM | POA: Diagnosis not present

## 2016-08-16 DIAGNOSIS — E1140 Type 2 diabetes mellitus with diabetic neuropathy, unspecified: Secondary | ICD-10-CM | POA: Diagnosis not present

## 2016-08-16 DIAGNOSIS — Z452 Encounter for adjustment and management of vascular access device: Secondary | ICD-10-CM

## 2016-08-16 DIAGNOSIS — N183 Chronic kidney disease, stage 3 (moderate): Secondary | ICD-10-CM | POA: Diagnosis present

## 2016-08-16 DIAGNOSIS — Z825 Family history of asthma and other chronic lower respiratory diseases: Secondary | ICD-10-CM

## 2016-08-16 DIAGNOSIS — I129 Hypertensive chronic kidney disease with stage 1 through stage 4 chronic kidney disease, or unspecified chronic kidney disease: Secondary | ICD-10-CM | POA: Diagnosis present

## 2016-08-16 DIAGNOSIS — Z1624 Resistance to multiple antibiotics: Secondary | ICD-10-CM | POA: Diagnosis not present

## 2016-08-16 DIAGNOSIS — Z794 Long term (current) use of insulin: Secondary | ICD-10-CM

## 2016-08-16 DIAGNOSIS — B965 Pseudomonas (aeruginosa) (mallei) (pseudomallei) as the cause of diseases classified elsewhere: Secondary | ICD-10-CM | POA: Diagnosis present

## 2016-08-16 DIAGNOSIS — E669 Obesity, unspecified: Secondary | ICD-10-CM | POA: Diagnosis present

## 2016-08-16 DIAGNOSIS — Z9889 Other specified postprocedural states: Secondary | ICD-10-CM | POA: Diagnosis not present

## 2016-08-16 DIAGNOSIS — L97329 Non-pressure chronic ulcer of left ankle with unspecified severity: Secondary | ICD-10-CM

## 2016-08-16 DIAGNOSIS — Z87891 Personal history of nicotine dependence: Secondary | ICD-10-CM | POA: Diagnosis not present

## 2016-08-16 DIAGNOSIS — I83223 Varicose veins of left lower extremity with both ulcer of ankle and inflammation: Secondary | ICD-10-CM

## 2016-08-16 DIAGNOSIS — Z8249 Family history of ischemic heart disease and other diseases of the circulatory system: Secondary | ICD-10-CM | POA: Diagnosis not present

## 2016-08-16 DIAGNOSIS — I890 Lymphedema, not elsewhere classified: Secondary | ICD-10-CM | POA: Diagnosis present

## 2016-08-16 DIAGNOSIS — L089 Local infection of the skin and subcutaneous tissue, unspecified: Secondary | ICD-10-CM

## 2016-08-16 DIAGNOSIS — Z6841 Body Mass Index (BMI) 40.0 and over, adult: Secondary | ICD-10-CM | POA: Diagnosis not present

## 2016-08-16 DIAGNOSIS — L97222 Non-pressure chronic ulcer of left calf with fat layer exposed: Secondary | ICD-10-CM | POA: Diagnosis not present

## 2016-08-16 LAB — BASIC METABOLIC PANEL
Anion gap: 6 (ref 5–15)
BUN: 32 mg/dL — ABNORMAL HIGH (ref 6–20)
CO2: 26 mmol/L (ref 22–32)
Calcium: 8.5 mg/dL — ABNORMAL LOW (ref 8.9–10.3)
Chloride: 105 mmol/L (ref 101–111)
Creatinine, Ser: 1.22 mg/dL — ABNORMAL HIGH (ref 0.44–1.00)
GFR calc Af Amer: 52 mL/min — ABNORMAL LOW (ref >60)
GFR calc non Af Amer: 45 mL/min — ABNORMAL LOW (ref >60)
Glucose, Bld: 156 mg/dL — ABNORMAL HIGH (ref 65–99)
Potassium: 3.9 mmol/L (ref 3.5–5.1)
Sodium: 137 mmol/L (ref 135–145)

## 2016-08-16 LAB — CBC WITH DIFFERENTIAL/PLATELET
Basophils Absolute: 0.1 10*3/uL (ref 0–0.1)
Basophils Relative: 1 %
Eosinophils Absolute: 0.2 10*3/uL (ref 0–0.7)
Eosinophils Relative: 2 %
HCT: 29.7 % — ABNORMAL LOW (ref 35.0–47.0)
Hemoglobin: 9.9 g/dL — ABNORMAL LOW (ref 12.0–16.0)
Lymphocytes Relative: 17 %
Lymphs Abs: 2 10*3/uL (ref 1.0–3.6)
MCH: 24.6 pg — ABNORMAL LOW (ref 26.0–34.0)
MCHC: 33.2 g/dL (ref 32.0–36.0)
MCV: 74.2 fL — ABNORMAL LOW (ref 80.0–100.0)
Monocytes Absolute: 0.8 10*3/uL (ref 0.2–0.9)
Monocytes Relative: 7 %
Neutro Abs: 8.2 10*3/uL — ABNORMAL HIGH (ref 1.4–6.5)
Neutrophils Relative %: 73 %
Platelets: 287 10*3/uL (ref 150–440)
RBC: 4 MIL/uL (ref 3.80–5.20)
RDW: 16.1 % — ABNORMAL HIGH (ref 11.5–14.5)
WBC: 11.2 10*3/uL — ABNORMAL HIGH (ref 3.6–11.0)

## 2016-08-16 LAB — LACTIC ACID, PLASMA: Lactic Acid, Venous: 1.3 mmol/L (ref 0.5–1.9)

## 2016-08-16 LAB — GLUCOSE, CAPILLARY: Glucose-Capillary: 138 mg/dL — ABNORMAL HIGH (ref 65–99)

## 2016-08-16 LAB — C-REACTIVE PROTEIN: CRP: 4.8 mg/dL — ABNORMAL HIGH (ref <1.0)

## 2016-08-16 MED ORDER — PANTOPRAZOLE SODIUM 40 MG PO TBEC
40.0000 mg | DELAYED_RELEASE_TABLET | Freq: Every day | ORAL | Status: DC
  Administered 2016-08-17 – 2016-08-19 (×3): 40 mg via ORAL
  Filled 2016-08-16 (×3): qty 1

## 2016-08-16 MED ORDER — CLONIDINE HCL 0.2 MG/24HR TD PTWK: 0.2000 mg | MEDICATED_PATCH | TRANSDERMAL | Status: DC

## 2016-08-16 MED ORDER — INSULIN ASPART 100 UNIT/ML ~~LOC~~ SOLN
0.0000 [IU] | Freq: Every day | SUBCUTANEOUS | Status: DC
  Administered 2016-08-19: 2 [IU] via SUBCUTANEOUS
  Filled 2016-08-16: qty 2

## 2016-08-16 MED ORDER — INSULIN ASPART PROT & ASPART (70-30 MIX) 100 UNIT/ML ~~LOC~~ SUSP
25.0000 [IU] | Freq: Two times a day (BID) | SUBCUTANEOUS | Status: DC
  Administered 2016-08-17 – 2016-08-19 (×5): 25 [IU] via SUBCUTANEOUS
  Filled 2016-08-16 (×5): qty 25

## 2016-08-16 MED ORDER — CEFAZOLIN IN D5W 1 GM/50ML IV SOLN
1.0000 g | Freq: Once | INTRAVENOUS | Status: DC
  Filled 2016-08-16 (×3): qty 50

## 2016-08-16 MED ORDER — CEFAZOLIN IN D5W 1 GM/50ML IV SOLN
INTRAVENOUS | Status: AC
Start: 2016-08-16 — End: 2016-08-17
  Filled 2016-08-16: qty 50

## 2016-08-16 MED ORDER — ATORVASTATIN CALCIUM 20 MG PO TABS
80.0000 mg | ORAL_TABLET | Freq: Every day | ORAL | Status: DC
  Administered 2016-08-17 – 2016-08-18 (×2): 80 mg via ORAL
  Filled 2016-08-16 (×2): qty 4

## 2016-08-16 MED ORDER — VANCOMYCIN HCL IN DEXTROSE 1-5 GM/200ML-% IV SOLN
1000.0000 mg | Freq: Once | INTRAVENOUS | Status: AC
  Administered 2016-08-16: 1000 mg via INTRAVENOUS
  Filled 2016-08-16 (×2): qty 200

## 2016-08-16 MED ORDER — INSULIN ASPART 100 UNIT/ML ~~LOC~~ SOLN
0.0000 [IU] | Freq: Three times a day (TID) | SUBCUTANEOUS | Status: DC
  Administered 2016-08-17 – 2016-08-18 (×6): 2 [IU] via SUBCUTANEOUS
  Administered 2016-08-19: 1 [IU] via SUBCUTANEOUS
  Administered 2016-08-19: 2 [IU] via SUBCUTANEOUS
  Filled 2016-08-16 (×7): qty 2
  Filled 2016-08-16: qty 1

## 2016-08-16 MED ORDER — ALBUTEROL SULFATE (2.5 MG/3ML) 0.083% IN NEBU: 2.5000 mg | INHALATION_SOLUTION | RESPIRATORY_TRACT | Status: DC | PRN

## 2016-08-16 MED ORDER — ACETAMINOPHEN 650 MG RE SUPP: 650.0000 mg | Freq: Four times a day (QID) | RECTAL | Status: DC | PRN

## 2016-08-16 MED ORDER — KETOROLAC TROMETHAMINE 15 MG/ML IJ SOLN
15.0000 mg | Freq: Four times a day (QID) | INTRAMUSCULAR | Status: DC | PRN
  Administered 2016-08-16 – 2016-08-19 (×5): 15 mg via INTRAVENOUS
  Filled 2016-08-16 (×5): qty 1

## 2016-08-16 MED ORDER — ONDANSETRON HCL 4 MG PO TABS: 4.0000 mg | ORAL_TABLET | Freq: Four times a day (QID) | ORAL | Status: DC | PRN

## 2016-08-16 MED ORDER — PROMETHAZINE HCL 25 MG PO TABS: 25.0000 mg | ORAL_TABLET | Freq: Three times a day (TID) | ORAL | Status: DC | PRN

## 2016-08-16 MED ORDER — VANCOMYCIN HCL IN DEXTROSE 750-5 MG/150ML-% IV SOLN
750.0000 mg | Freq: Two times a day (BID) | INTRAVENOUS | Status: DC
  Administered 2016-08-17 – 2016-08-19 (×5): 750 mg via INTRAVENOUS
  Filled 2016-08-16 (×6): qty 150

## 2016-08-16 MED ORDER — CLOPIDOGREL BISULFATE 75 MG PO TABS
75.0000 mg | ORAL_TABLET | Freq: Every day | ORAL | Status: DC
  Administered 2016-08-17 – 2016-08-19 (×3): 75 mg via ORAL
  Filled 2016-08-16 (×3): qty 1

## 2016-08-16 MED ORDER — ASPIRIN EC 81 MG PO TBEC
81.0000 mg | DELAYED_RELEASE_TABLET | Freq: Every day | ORAL | Status: DC
  Administered 2016-08-17 – 2016-08-19 (×3): 81 mg via ORAL
  Filled 2016-08-16 (×3): qty 1

## 2016-08-16 MED ORDER — LISINOPRIL 20 MG PO TABS
40.0000 mg | ORAL_TABLET | Freq: Every day | ORAL | Status: DC
  Administered 2016-08-17 – 2016-08-19 (×3): 40 mg via ORAL
  Filled 2016-08-16 (×3): qty 2

## 2016-08-16 MED ORDER — ENOXAPARIN SODIUM 40 MG/0.4ML ~~LOC~~ SOLN
40.0000 mg | Freq: Two times a day (BID) | SUBCUTANEOUS | Status: DC
  Administered 2016-08-16 – 2016-08-19 (×6): 40 mg via SUBCUTANEOUS
  Filled 2016-08-16 (×6): qty 0.4

## 2016-08-16 MED ORDER — TRAMADOL HCL 50 MG PO TABS
50.0000 mg | ORAL_TABLET | Freq: Four times a day (QID) | ORAL | Status: DC | PRN
  Administered 2016-08-17 (×2): 50 mg via ORAL
  Filled 2016-08-16 (×2): qty 1

## 2016-08-16 MED ORDER — ACETAMINOPHEN 325 MG PO TABS: 650.0000 mg | ORAL_TABLET | Freq: Four times a day (QID) | ORAL | Status: DC | PRN

## 2016-08-16 MED ORDER — PIPERACILLIN-TAZOBACTAM 3.375 G IVPB
3.3750 g | Freq: Three times a day (TID) | INTRAVENOUS | Status: DC
  Administered 2016-08-16 – 2016-08-19 (×7): 3.375 g via INTRAVENOUS
  Filled 2016-08-16 (×10): qty 50

## 2016-08-16 MED ORDER — ONDANSETRON HCL 4 MG/2ML IJ SOLN
4.0000 mg | INTRAMUSCULAR | Status: AC
  Administered 2016-08-16: 4 mg via INTRAVENOUS
  Filled 2016-08-16: qty 2

## 2016-08-16 MED ORDER — METOPROLOL SUCCINATE ER 100 MG PO TB24
100.0000 mg | ORAL_TABLET | Freq: Every day | ORAL | Status: DC
  Administered 2016-08-17 – 2016-08-19 (×3): 100 mg via ORAL
  Filled 2016-08-16 (×3): qty 1

## 2016-08-16 MED ORDER — MORPHINE SULFATE (PF) 4 MG/ML IV SOLN
4.0000 mg | Freq: Once | INTRAVENOUS | Status: AC
  Administered 2016-08-16: 4 mg via INTRAVENOUS
  Filled 2016-08-16: qty 1

## 2016-08-16 MED ORDER — HYDRALAZINE HCL 25 MG PO TABS
25.0000 mg | ORAL_TABLET | Freq: Three times a day (TID) | ORAL | Status: DC
Start: 2016-08-16 — End: 2016-08-20
  Administered 2016-08-16 – 2016-08-19 (×9): 25 mg via ORAL
  Filled 2016-08-16 (×8): qty 1

## 2016-08-16 MED ORDER — ONDANSETRON HCL 4 MG/2ML IJ SOLN: 4.0000 mg | Freq: Four times a day (QID) | INTRAMUSCULAR | Status: DC | PRN

## 2016-08-16 MED ORDER — POTASSIUM CHLORIDE CRYS ER 20 MEQ PO TBCR
20.0000 meq | EXTENDED_RELEASE_TABLET | Freq: Every day | ORAL | Status: DC
  Administered 2016-08-17 – 2016-08-19 (×3): 20 meq via ORAL
  Filled 2016-08-16 (×3): qty 1

## 2016-08-16 NOTE — Progress Notes (Signed)
seeiheal 

## 2016-08-16 NOTE — ED Notes (Signed)
Attempted to call report.  Nurses in shift change and will try again soon.

## 2016-08-16 NOTE — ED Provider Notes (Signed)
[facility] Emergency Department Provider Note  ____________________________________________   First MD Initiated Contact with Patient 08/16/16 1745     (approximate)  I have reviewed the triage vital signs and the nursing notes.   HISTORY  Chief Complaint Wound Check    HPI Brooke Hopkins is a 65 y.o. female with chronic bilateral lower leg wounds and chronic bilateral lower extremity lymphedema who presents for worsening wounds and cellulitis of both lower legs.  She has home health come out twice a week and goes to wound care once a week.  She saw her wound care doctor, Dr. Judene Companion, today, and he commented that she seems to be getting worse in spite of outpatient antibiotics (ciprofloxacin).  He commented that she may need IV antibiotics, and may even need to go home with a PICC line.  He encouraged her to go to the emergency department if her pain gets worse.  Within a few hours of going to the wound care clinic she has leaked through her dressing on the right leg with serous and purulent fluid and her pain is severe, worse with any kind of movement or weightbearing,nothing makes it better, she decided to come to the emergency department.  She denies fever/chills, chest pain, shortness of breath, nausea, vomiting, and abdominal pain.  She recently had an outpatient wound culture performed and reportedly the results are available but not in our computer system.   Past Medical History:  Diagnosis Date  . Chronic kidney disease   . Diabetes mellitus   . Hypertension   . Stroke [facility])     Patient Active Problem List   Diagnosis Date Noted  . 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 Pressure ulcer 10/17/2015  . Cellulitis 10/13/2015    Past Surgical History:  Procedure Laterality Date  . ABDOMINAL HYSTERECTOMY    . CHOLECYSTECTOMY    . HAMMER TOE SURGERY    . KNEE ARTHROSCOPY    . LUMBAR DISC SURGERY   11/07/11  . LUMBAR WOUND DEBRIDEMENT  11/27/2011   Procedure: LUMBAR WOUND DEBRIDEMENT;  Surgeon: Eustace Moore;  Location: [facility];  Service: Neurosurgery;  Laterality: N/A;  . TONSILLECTOMY      Prior to Admission medications   Medication Sig Start Date End Date Taking? Authorizing Provider  aspirin EC 81 MG tablet Take 81 mg by mouth daily.    Historical Provider, MD  atorvastatin (LIPITOR) 80 MG tablet Take 1 tablet (80 mg total) by mouth daily at 6 PM. 10/17/15   Nicholes Mango, MD  cloNIDine (CATAPRES - DOSED IN MG/24 HR) 0.2 mg/24hr patch Place 1 patch (0.2 mg total) onto the skin once a week. 10/17/15   Nicholes Mango, MD  clopidogrel (PLAVIX) 75 MG tablet Take 75 mg by mouth daily.    Historical Provider, MD  collagenase (SANTYL) ointment Apply topically daily. 10/17/15   Nicholes Mango, MD  furosemide (LASIX) 20 MG tablet Take 40 mg by mouth daily.     Historical Provider, MD  hydrALAZINE (APRESOLINE) 25 MG tablet Take 1 tablet (25 mg total) by mouth every 8 (eight) hours. 10/17/15   Nicholes Mango, MD  insulin aspart protamine- aspart (NOVOLOG MIX 70/30) (70-30) 100 UNIT/ML injection Inject 0.25 mLs (25 Units total) into the skin 2 (two) times daily with a meal. 10/17/15   Nicholes Mango, MD  lisinopril (PRINIVIL,ZESTRIL) 40 MG tablet Take 40 mg by mouth daily.    Historical Provider, MD  metFORMIN (GLUCOPHAGE-XR) 500 MG 24 hr tablet Take 500 mg by mouth daily with supper.    Historical Provider, MD  metoprolol succinate (TOPROL-XL) 100 MG 24 hr tablet Take 100 mg by mouth daily.    Historical Provider, MD  nystatin cream (MYCOSTATIN) Apply topically 2 (two) times daily. 10/17/15   Nicholes Mango, MD  oxyCODONE (OXY IR/ROXICODONE) 5 MG immediate release tablet Take 1 tablet (5 mg total) by mouth every 4 (four) hours as needed for moderate pain. 10/17/15   Nicholes Mango, MD  pantoprazole (PROTONIX) 40 MG tablet Take 40 mg by mouth daily.    Historical Provider, MD  potassium chloride SA  (K-DUR,KLOR-CON) 20 MEQ tablet Take 20 mEq by mouth daily.    Historical Provider, MD  promethazine (PHENERGAN) 25 MG tablet Take 25 mg by mouth every 8 (eight) hours as needed for nausea or vomiting.    Historical Provider, MD  triamcinolone ointment (KENALOG) 0.5 % Apply 1 application topically 2 (two) times daily.    Historical Provider, MD    Allergies Review of patient's allergies indicates no known allergies.  Family History  Problem Relation Age of Onset  . CAD Mother   . Hypertension Mother   . COPD Father   . Diabetes Sister   . Hypertension Sister     Social History Social History  Substance Use Topics  . Smoking status: Never Smoker  . Smokeless tobacco: Not on file  . Alcohol use No    Review of Systems Constitutional: No fever/chills Eyes: No visual changes. ENT: No sore throat. Cardiovascular: Denies chest pain. Respiratory: Denies shortness of breath. Gastrointestinal: No abdominal pain.  No nausea, no vomiting.  No diarrhea.  No constipation. Genitourinary: Negative for dysuria. Musculoskeletal: Negative for back pain. Skin: Negative for rash. Neurological: Negative for headaches, focal weakness or numbness.  10-point ROS otherwise negative.  ____________________________________________   PHYSICAL EXAM:  VITAL SIGNS: ED Triage Vitals [08/16/16 1613]  Enc Vitals Group     BP (!) 151/56     Pulse Rate 66     Resp 20     Temp 98.3 F (36.8 C)     Temp Source Oral     SpO2 98 %     Weight 297 lb (134.7 kg)     Height 5\' 7"  (1.702 m)     Head Circumference      Peak Flow      Pain Score 6     Pain Loc      Pain Edu?      Excl. in [place]?     Constitutional: Alert and oriented. Has the appearance of chronic illness but is not in acute distress Eyes: Conjunctivae are normal. PERRL. EOMI. Head: Atraumatic. Nose: No congestion/rhinnorhea. Mouth/Throat: Mucous membranes are moist.  Oropharynx non-erythematous. Neck: No stridor.  No meningeal  signs.   Cardiovascular: Normal rate, regular rhythm. Good peripheral circulation. Grossly normal heart sounds.  Respiratory: Normal respiratory effort.  No retractions. Lungs CTAB. Gastrointestinal: Soft and nontender. No distention.  Musculoskeletal & Skin:  Chronic bilateral lymphedema with what appears to be acute on chronic cellulitis reticulated of the right lower leg.  She has multiple open wounds with purulent material in them, particularly on the back of the right lower leg.  She is draining a copious amount of serous fluid with occasional purulence as well.  She is tender to palpation throughout and has pitting edema throughout.  The erythema/cellulitis is warm to the touch. Neurologic:  Normal speech and language.  No gross focal neurologic deficits are appreciated.  Psychiatric: Mood and affect are normal. Speech and behavior are normal.  ____________________________________________   LABS (all labs ordered are listed, but only abnormal results are displayed)  Labs Reviewed  CBC WITH DIFFERENTIAL/PLATELET - Abnormal; Notable for the following:       Result Value   WBC 11.2 (*)    Hemoglobin 9.9 (*)    HCT 29.7 (*)    MCV 74.2 (*)    MCH 24.6 (*)    RDW 16.1 (*)    Neutro Abs 8.2 (*)    All other components within normal limits  BASIC METABOLIC PANEL - Abnormal; Notable for the following:    Glucose, Bld 156 (*)    BUN 32 (*)    Creatinine, Ser 1.22 (*)    Calcium 8.5 (*)    GFR calc non Af Amer 45 (*)    GFR calc Af Amer 52 (*)    All other components within normal limits  CULTURE, BLOOD (ROUTINE X 2)  CULTURE, BLOOD (ROUTINE X 2)  AEROBIC/ANAEROBIC CULTURE (SURGICAL/DEEP WOUND)  LACTIC ACID, PLASMA  C-REACTIVE PROTEIN   ____________________________________________  EKG  None ____________________________________________  RADIOLOGY   No results found.  ____________________________________________   PROCEDURES  Procedure(s) performed:    Procedures   Critical Care performed: No ____________________________________________   INITIAL IMPRESSION / ASSESSMENT AND PLAN / ED COURSE  Pertinent labs & imaging results that were available during my care of the patient were reviewed by me and considered in my medical decision making (see chart for details).  Labs are normal.  It is difficult to appreciate exactly what is acute and what is chronic, but according to the report she is worsening in spite of outpatient antibiotics.  She needs pain medication, infectious disease evaluation, and wound care recommendations.  She may need PICC line placement.  Additionally she can barely walk with the extent of her wounds and lymphedema and she may benefit even from placement in a rehabilitation facility.  I discussed the case in person with the hospitalist to admit.  I am ordering Ancef and vancomycin to start until more culture information is available.   ____________________________________________  FINAL CLINICAL IMPRESSION(S) / ED DIAGNOSES  Final diagnoses:  Bilateral cellulitis of lower leg  Lymphedema of both lower extremities     MEDICATIONS GIVEN DURING THIS VISIT:  Medications  ceFAZolin (ANCEF) IVPB 1 g/50 mL premix (not administered)  vancomycin (VANCOCIN) IVPB 1000 mg/200 mL premix (not administered)  morphine 4 MG/ML injection 4 mg (not administered)  ondansetron (ZOFRAN) injection 4 mg (not administered)     NEW OUTPATIENT MEDICATIONS STARTED DURING THIS VISIT:  New Prescriptions   No medications on file      Note:  This document was prepared using Dragon voice recognition software and may include unintentional dictation errors.    Hinda Kehr, MD 08/16/16 [phone]

## 2016-08-16 NOTE — ED Notes (Signed)
Pt's home health nurse is Geronimo Boot reachable at [phone].

## 2016-08-16 NOTE — H&P (Signed)
[facility] [place] NAME: Brooke Hopkins    MR#:  [id number]  DATE OF BIRTH:  [birth year]  DATE OF ADMISSION:  08/16/2016  PRIMARY CARE PHYSICIAN: Leonel Ramsay, MD   REQUESTING/REFERRING PHYSICIAN: Hinda Kehr, MD  CHIEF COMPLAINT:   Chief Complaint  Patient presents with  . Wound Check   Worsening leg wound infection for the past the 3 days. HISTORY OF PRESENT ILLNESS:  Brooke Hopkins  is a 65 y.o. female with a known history of Hypertension, diabetes, CVA and the CKD. She has chronic bilateral lower leg wounds and chronic bilateral lower extremity lymphedema, which has been worsening for the past 3 days in spite of outpatient antibiotics on Cipro. She was sent from wound care clinic to the ED for further treatment with IV antibiotics. The patient denies any fever or chills, nausea or vomiting or diarrhea. She was treated with antibiotics in the ED.  PAST MEDICAL HISTORY:   Past Medical History:  Diagnosis Date  . Chronic kidney disease   . Diabetes mellitus   . Hypertension   . Stroke [facility])     PAST SURGICAL HISTORY:   Past Surgical History:  Procedure Laterality Date  . ABDOMINAL HYSTERECTOMY    . CHOLECYSTECTOMY    . HAMMER TOE SURGERY    . KNEE ARTHROSCOPY    . LUMBAR DISC SURGERY  11/07/11  . LUMBAR WOUND DEBRIDEMENT  11/27/2011   Procedure: LUMBAR WOUND DEBRIDEMENT;  Surgeon: Eustace Moore;  Location: [place] [facility];  Service: Neurosurgery;  Laterality: N/A;  . TONSILLECTOMY      SOCIAL HISTORY:   Social History  Substance Use Topics  . Smoking status: Never Smoker  . Smokeless tobacco: Never Used  . Alcohol use No    FAMILY HISTORY:   Family History  Problem Relation Age of Onset  . CAD Mother   . Hypertension Mother   . COPD Father   . Diabetes Sister   . Hypertension Sister     DRUG ALLERGIES:  No Known Allergies  REVIEW OF SYSTEMS:   Review of Systems  Constitutional: Positive for  malaise/fatigue. Negative for chills and fever.  HENT: Negative for congestion.   Eyes: Negative for blurred vision and double vision.  Respiratory: Negative for cough, hemoptysis, shortness of breath, wheezing and stridor.   Cardiovascular: Negative for chest pain, palpitations, orthopnea and leg swelling.  Gastrointestinal: Negative for abdominal pain, diarrhea, nausea and vomiting.  Genitourinary: Negative for dysuria and urgency.  Musculoskeletal: Negative for back pain.  Skin:       Bilateral leg redness, tenderness and swelling.  Neurological: Negative for dizziness, tingling, loss of consciousness and headaches.  Psychiatric/Behavioral: Negative for depression.    MEDICATIONS AT HOME:   Prior to Admission medications   Medication Sig Start Date End Date Taking? Authorizing Provider  aspirin EC 81 MG tablet Take 81 mg by mouth daily.   Yes Historical Provider, MD  atorvastatin (LIPITOR) 80 MG tablet Take 1 tablet (80 mg total) by mouth daily at 6 PM. 10/17/15  Yes Nicholes Mango, MD  ciprofloxacin (CIPRO) 750 MG tablet Take 750 mg by mouth 2 (two) times daily.   Yes Historical Provider, MD  clopidogrel (PLAVIX) 75 MG tablet Take 75 mg by mouth daily.   Yes Historical Provider, MD  furosemide (LASIX) 40 MG tablet Take 40 mg by mouth daily.    Yes Historical Provider, MD  hydrALAZINE (APRESOLINE) 25 MG tablet Take 1 tablet (25  mg total) by mouth every 8 (eight) hours. 10/17/15  Yes Nicholes Mango, MD  insulin aspart protamine- aspart (NOVOLOG MIX 70/30) (70-30) 100 UNIT/ML injection Inject 0.25 mLs (25 Units total) into the skin 2 (two) times daily with a meal. Patient taking differently: Inject 65 Units into the skin 2 (two) times daily with a meal.  10/17/15  Yes Nicholes Mango, MD  insulin NPH-regular Human (NOVOLIN 70/30) (70-30) 100 UNIT/ML injection Inject 65 Units into the skin 2 (two) times daily.   Yes Historical Provider, MD  lisinopril (PRINIVIL,ZESTRIL) 40 MG tablet Take 40 mg by  mouth daily.   Yes Historical Provider, MD  metFORMIN (GLUCOPHAGE-XR) 500 MG 24 hr tablet Take 1,000 mg by mouth daily with supper.    Yes Historical Provider, MD  metoprolol succinate (TOPROL-XL) 100 MG 24 hr tablet Take 100 mg by mouth daily.   Yes Historical Provider, MD  pantoprazole (PROTONIX) 40 MG tablet Take 40 mg by mouth daily.   Yes Historical Provider, MD  potassium chloride SA (K-DUR,KLOR-CON) 20 MEQ tablet Take 20 mEq by mouth daily.   Yes Historical Provider, MD  cloNIDine (CATAPRES - DOSED IN MG/24 HR) 0.2 mg/24hr patch Place 1 patch (0.2 mg total) onto the skin once a week. 10/17/15   Nicholes Mango, MD  collagenase (SANTYL) ointment Apply topically daily. 10/17/15   Nicholes Mango, MD  nystatin cream (MYCOSTATIN) Apply topically 2 (two) times daily. Patient taking differently: Apply topically 2 (two) times daily as needed.  10/17/15   Nicholes Mango, MD  oxyCODONE (OXY IR/ROXICODONE) 5 MG immediate release tablet Take 1 tablet (5 mg total) by mouth every 4 (four) hours as needed for moderate pain. Patient not taking: Reported on 08/16/2016 10/17/15   Nicholes Mango, MD  promethazine (PHENERGAN) 25 MG tablet Take 25 mg by mouth every 8 (eight) hours as needed for nausea or vomiting.    Historical Provider, MD  triamcinolone ointment (KENALOG) 0.5 % Apply 1 application topically 2 (two) times daily.    Historical Provider, MD      VITAL SIGNS:  Blood pressure (!) 159/64, pulse 67, temperature 98.1 F (36.7 C), temperature source Oral, resp. rate 20, height 5\' 7"  (1.702 m), weight 297 lb (134.7 kg), SpO2 99 %.  PHYSICAL EXAMINATION:  Physical Exam  GENERAL:  65 y.o.-year-old patient lying in the bed with no acute distress. Morbidly obese. EYES: Pupils equal, round, reactive to light and accommodation. No scleral icterus. Extraocular muscles intact.  HEENT: Head atraumatic, normocephalic. Oropharynx and nasopharynx clear.  NECK:  Supple, no jugular venous distention. No thyroid  enlargement, no tenderness.  LUNGS: Normal breath sounds bilaterally, no wheezing, rales,rhonchi or crepitation. No use of accessory muscles of respiration.  CARDIOVASCULAR: S1, S2 normal. No murmurs, rubs, or gallops.  ABDOMEN: Soft, nontender, nondistended. Bowel sounds present. No organomegaly or mass.  EXTREMITIES: Bilateral leg edema, increasing edema and tenderness, right side is worse than left side. no cyanosis, or clubbing.  NEUROLOGIC: Cranial nerves II through XII are intact. Muscle strength 5/5 in all extremities. Sensation intact. Gait not checked.  PSYCHIATRIC: The patient is alert and oriented x 3.  SKIN: No obvious rash, lesion, or ulcer.   LABORATORY PANEL:   CBC  Recent Labs Lab 08/16/16 1625  WBC 11.2*  HGB 9.9*  HCT 29.7*  PLT 287   ------------------------------------------------------------------------------------------------------------------  Chemistries   Recent Labs Lab 08/16/16 1625  NA 137  K 3.9  CL 105  CO2 26  GLUCOSE 156*  BUN 32*  CREATININE  1.22*  CALCIUM 8.5*   ------------------------------------------------------------------------------------------------------------------  Cardiac Enzymes No results for input(s): TROPONINI in the last 168 hours. ------------------------------------------------------------------------------------------------------------------  RADIOLOGY:  No results found.    IMPRESSION AND PLAN:   Bilateral leg wound infection and cellulitis Patient will be admitted to medical floor. I will start vancomycin and Zosyn, follow-up wound care consult and ID.  Hypertension. Continue hypertension medication Diabetes. Continue NovoLog Mix 70/30 and start sliding scale. CKD 3, stable.  All the records are reviewed and case discussed with ED provider. Management plans discussed with the patient, her husband and they are in agreement.  CODE STATUS: Full code  TOTAL TIME TAKING CARE OF THIS PATIENT: 52 minutes.      Demetrios Loll M.D on 08/16/2016 at 9:01 PM  Between 7am to 6pm - Pager - [phone]  After 6pm go to [email or web address] - [employer] [facility]  Office  [phone]  CC: Primary care physician; FITZGERALD, DAVID Mamie Nick, MD   Note: This dictation was prepared with Dragon dictation along with smaller phrase technology. Any transcriptional errors that result from this process are unintentional.

## 2016-08-16 NOTE — ED Triage Notes (Addendum)
Pt sees wound clinic for wounds to BLE. Has had pain and wound clinic told pt if continues to have pain in legs to go to ED. Pain has been bad today so pt presents for evaluation. No fevers. Concern for cellulitis. Pt has special dressing in place. Unable to visualize in triage. One has green drainage. Pt has been on abx for 2 months for wounds. Blood sugar 130 this AM

## 2016-08-16 NOTE — Progress Notes (Signed)
Pharmacy Antibiotic Note  NELVIA MEINEKE is a 65 y.o. female admitted on 08/16/2016 with cellulitis.  Pharmacy has been consulted for vancomycin dosing.  Plan: Vancomycin 750 mg IV q12h (stacked dose 6 hours after 1000 mg initial dose) Vancomycin goal trough 10-15 mcg/mL Vancomycin trough ordered 8/20 @ 1230  Kinetics: Based on adjusted body weight of 90 kg Ke: 0.058 Half-life: 11.8 hrs Vd: 63 L Patient at risk for accumulation  Height: 5\' 7"  (170.2 cm) Weight: 297 lb (134.7 kg) IBW/kg (Calculated) : 61.6  Temp (24hrs), Avg:98.3 F (36.8 C), Min:98.3 F (36.8 C), Max:98.3 F (36.8 C)   Recent Labs Lab 08/16/16 1625  WBC 11.2*  CREATININE 1.22*  LATICACIDVEN 1.3    Estimated Creatinine Clearance: 65 mL/min (by C-G formula based on SCr of 1.22 mg/dL).    No Known Allergies   Thank you for allowing pharmacy to be a part of this patient's care.  Darylene Price [facility] 08/16/2016 7:52 PM

## 2016-08-17 ENCOUNTER — Encounter (INDEPENDENT_AMBULATORY_CARE_PROVIDER_SITE_OTHER): Payer: Self-pay

## 2016-08-17 DIAGNOSIS — E7800 Pure hypercholesterolemia, unspecified: Secondary | ICD-10-CM | POA: Insufficient documentation

## 2016-08-17 DIAGNOSIS — I890 Lymphedema, not elsewhere classified: Secondary | ICD-10-CM | POA: Insufficient documentation

## 2016-08-17 LAB — CBC
HCT: 27.5 % — ABNORMAL LOW (ref 35.0–47.0)
Hemoglobin: 9.1 g/dL — ABNORMAL LOW (ref 12.0–16.0)
MCH: 24.5 pg — ABNORMAL LOW (ref 26.0–34.0)
MCHC: 32.9 g/dL (ref 32.0–36.0)
MCV: 74.2 fL — ABNORMAL LOW (ref 80.0–100.0)
Platelets: 241 10*3/uL (ref 150–440)
RBC: 3.7 MIL/uL — ABNORMAL LOW (ref 3.80–5.20)
RDW: 16.2 % — ABNORMAL HIGH (ref 11.5–14.5)
WBC: 9 10*3/uL (ref 3.6–11.0)

## 2016-08-17 LAB — GLUCOSE, CAPILLARY
Glucose-Capillary: 184 mg/dL — ABNORMAL HIGH (ref 65–99)
Glucose-Capillary: 181 mg/dL — ABNORMAL HIGH (ref 65–99)
Glucose-Capillary: 164 mg/dL — ABNORMAL HIGH (ref 65–99)
Glucose-Capillary: 175 mg/dL — ABNORMAL HIGH (ref 65–99)

## 2016-08-17 LAB — BASIC METABOLIC PANEL
Anion gap: 6 (ref 5–15)
BUN: 31 mg/dL — ABNORMAL HIGH (ref 6–20)
CO2: 26 mmol/L (ref 22–32)
Calcium: 8.1 mg/dL — ABNORMAL LOW (ref 8.9–10.3)
Chloride: 106 mmol/L (ref 101–111)
Creatinine, Ser: 1.17 mg/dL — ABNORMAL HIGH (ref 0.44–1.00)
GFR calc Af Amer: 55 mL/min — ABNORMAL LOW (ref >60)
GFR calc non Af Amer: 47 mL/min — ABNORMAL LOW (ref >60)
Glucose, Bld: 175 mg/dL — ABNORMAL HIGH (ref 65–99)
Potassium: 3.9 mmol/L (ref 3.5–5.1)
Sodium: 138 mmol/L (ref 135–145)

## 2016-08-17 MED ORDER — NYSTATIN 100000 UNIT/GM EX POWD
Freq: Two times a day (BID) | CUTANEOUS | Status: DC
  Administered 2016-08-17 – 2016-08-18 (×4): via TOPICAL
  Filled 2016-08-17: qty 15

## 2016-08-17 MED ORDER — FUROSEMIDE 10 MG/ML IJ SOLN
20.0000 mg | Freq: Two times a day (BID) | INTRAMUSCULAR | Status: DC
  Administered 2016-08-17 – 2016-08-19 (×4): 20 mg via INTRAVENOUS
  Filled 2016-08-17 (×4): qty 2

## 2016-08-17 NOTE — Care Management Note (Signed)
Case Management Note  Patient Details  Name: Brooke Hopkins MRN: [id number] Date of Birth: 08-Jul-1951  Subjective/Objective:  [name] reports that she is having problems obtaining wound dressings at home. She is receiving outpatient wound management at [facility] in [place] and is receiving home health RN services by [facility]. [name] at [place] was updated and will follow up with [name] about any home wound supplies needed after this hospital discharge.                    Action/Plan:   Expected Discharge Date:                  Expected Discharge Plan:     In-House Referral:     Discharge planning Services     Post Acute Care Choice:    Choice offered to:     DME Arranged:    DME Agency:     HH Arranged:    HH Agency:     Status of Service:     If discussed at [employer] of Stay Meetings, dates discussed:    Additional Comments:  Jaylah Goodlow A, RN 08/17/2016, 8:34 AM

## 2016-08-17 NOTE — Clinical Social Work Note (Signed)
CSW consulted for needs concerning medical supplies. CSW contacted CM for follow-up. CSW signing off.  Santiago Bumpers, MSW, LCSW-A [phone]

## 2016-08-17 NOTE — Progress Notes (Signed)
[facility] [place] NAME: Fusako Espejo    MR#:  [id number]  DATE OF BIRTH:  10/04/1951  SUBJECTIVE:  Came in with increasing leg swelling  And non healing ulcers with serosanginous Lot of leg pain REVIEW OF SYSTEMS:   Review of Systems  Constitutional: Negative for chills, fever and weight loss.  HENT: Negative for ear discharge, ear pain and nosebleeds.   Eyes: Negative for blurred vision, pain and discharge.  Respiratory: Negative for sputum production, shortness of breath, wheezing and stridor.   Cardiovascular: Positive for leg swelling. Negative for chest pain, palpitations, orthopnea and PND.  Gastrointestinal: Negative for abdominal pain, diarrhea, nausea and vomiting.  Genitourinary: Negative for frequency and urgency.  Musculoskeletal: Positive for back pain and joint pain.  Neurological: Negative for sensory change, speech change, focal weakness and weakness.  Psychiatric/Behavioral: Negative for depression and hallucinations. The patient is not nervous/anxious.    Tolerating Diet:yes Tolerating PT: uses WC  DRUG ALLERGIES:  No Known Allergies  VITALS:  Blood pressure (!) 166/58, pulse 66, temperature 97.7 F (36.5 C), temperature source Oral, resp. rate 16, height 5\' 7"  (1.702 m), weight 297 lb (134.7 kg), SpO2 99 %.  PHYSICAL EXAMINATION:   Physical Exam  GENERAL:  65 y.o.-year-old patient lying in the bed with no acute distress. obese EYES: Pupils equal, round, reactive to light and accommodation. No scleral icterus. Extraocular muscles intact.  HEENT: Head atraumatic, normocephalic. Oropharynx and nasopharynx clear.  NECK:  Supple, no jugular venous distention. No thyroid enlargement, no tenderness.  LUNGS: Normal breath sounds bilaterally, no wheezing, rales, rhonchi. No use of accessory muscles of respiration.  CARDIOVASCULAR: S1, S2 normal. No murmurs, rubs, or gallops.  ABDOMEN: Soft, nontender,  nondistended. Bowel sounds present. No organomegaly or mass.  EXTREMITIES: No cyanosis, clubbing or edema b/l.    NEUROLOGIC: Cranial nerves II through XII are intact. No focal Motor or sensory deficits b/l.   PSYCHIATRIC: patient is alert and oriented x 3.  SKIN: No obvious rash, lesion, or ulcer.   LABORATORY PANEL:  CBC  Recent Labs Lab 08/17/16 0604  WBC 9.0  HGB 9.1*  HCT 27.5*  PLT 241    Chemistries   Recent Labs Lab 08/17/16 0604  NA 138  K 3.9  CL 106  CO2 26  GLUCOSE 175*  BUN 31*  CREATININE 1.17*  CALCIUM 8.1*    ASSESSMENT AND PLAN:  Aremy Shavers  is a 65 y.o. female with a known history of Hypertension, diabetes, CVA and the CKD. She has chronic bilateral lower leg wounds and chronic bilateral lower extremity lymphedema, which has been worsening for the past 3 days in spite of outpatient antibiotics on Cipro. She was sent from wound care clinic to the ED for further treatment with IV antibiotics  *Bilateral leg wound infection and cellulitis, acute on chronic -on vancomycin and Zosyn, follow-up wound care consult  -cont lasix -pt's inactivity is not helping with her lymphedema -Surgical consult if not improvement -severe leg edema---will give IV lasix bid  *Hypertension. Continue clonidine, hydralazine, lisinopril and BB  * Diabetes. Continue NovoLog Mix 70/30 and start sliding scale.  *CKD 3, stable. -monitor creat with pt being on lasix -avoid nephrotoxins  Case discussed with Care Management/Social Worker. Management plans discussed with the patient, family and they are in agreement.  CODE STATUS: full  DVT Prophylaxis:lovenox TOTAL TIME TAKING CARE OF THIS PATIENT: 30 minutes.  >50% time spent on counselling and coordination of  care  POSSIBLE D/C IN 2-3 DAYS, DEPENDING ON CLINICAL CONDITION.  Note: This dictation was prepared with Dragon dictation along with smaller phrase technology. Any transcriptional errors that result from this  process are unintentional.  Cerinity Zynda M.D on 08/17/2016 at 3:42 PM  Between 7am to 6pm - Pager - [phone]  After 6pm go to [email or web address] - password [email or web address] [facility]  [place] [facility]  Office  [phone]  CC: Primary care physician; Leonel Ramsay, MD

## 2016-08-17 NOTE — Progress Notes (Signed)
PT Cancellation Note  Patient Details Name: Brooke Hopkins MRN: [id number] DOB: [birth year]   Cancelled Treatment:    Reason Eval/Treat Not Completed: Pain limiting ability to participate (Pt has edema and pain on RLE posterior calf area and asks to) wait until tomorrow.   Ramond Dial 08/17/2016, 4:06 PM    Mee Hives, PT MS Acute Rehab Dept. Number: [place] [facility] [place]

## 2016-08-18 LAB — VANCOMYCIN, TROUGH: Vancomycin Tr: 34 ug/mL (ref 15–20)

## 2016-08-18 LAB — GLUCOSE, CAPILLARY
Glucose-Capillary: 151 mg/dL — ABNORMAL HIGH (ref 65–99)
Glucose-Capillary: 162 mg/dL — ABNORMAL HIGH (ref 65–99)
Glucose-Capillary: 163 mg/dL — ABNORMAL HIGH (ref 65–99)
Glucose-Capillary: 167 mg/dL — ABNORMAL HIGH (ref 65–99)

## 2016-08-18 NOTE — Progress Notes (Signed)
   08/18/16 1400  PT Visit Information  Last PT Received On 08/18/16  Reason Eval/Treat Not Completed Patient declined, no reason specified (Patient concerned about walking in hall with open wounds)  Patient has significant edema in BLE with open wounds. She is awaiting LE dressing from wound care tomorrow. She was concerned about walking in hallway with open wounds as she did not want to get additional infection. RN is away at lunch. Will wait and contact RN to get recommendations. Will re-attempt PT eval once plan is more clear. Hillis Range, PT, DPT, [phone] 3:17 PM

## 2016-08-18 NOTE — Progress Notes (Signed)
Bro[facility] Hopkins at [place] NAME: Brooke Hopkins    MR#:  [id number]  DATE OF BIRTH:  11-28-1951  SUBJECTIVE:  Came in with increasing leg swelling  And non healing ulcers with serosanginous Able to move her left leg w/o much pain today Redness not increasing REVIEW OF SYSTEMS:   Review of Systems  Constitutional: Negative for chills, fever and weight loss.  HENT: Negative for ear discharge, ear pain and nosebleeds.   Eyes: Negative for blurred vision, pain and discharge.  Respiratory: Negative for sputum production, shortness of breath, wheezing and stridor.   Cardiovascular: Positive for leg swelling. Negative for chest pain, palpitations, orthopnea and PND.  Gastrointestinal: Negative for abdominal pain, diarrhea, nausea and vomiting.  Genitourinary: Negative for frequency and urgency.  Musculoskeletal: Positive for back pain and joint pain.  Neurological: Negative for sensory change, speech change, focal weakness and weakness.  Psychiatric/Behavioral: Negative for depression and hallucinations. The patient is not nervous/anxious.    Tolerating Diet:yes Tolerating PT: uses WC  DRUG ALLERGIES:  No Known Allergies  VITALS:  Blood pressure (!) 154/66, pulse 70, temperature 99.5 F (37.5 C), temperature source Oral, resp. rate 20, height 5\' 7"  (1.702 m), weight 297 lb (134.7 kg), SpO2 96 %.  PHYSICAL EXAMINATION:   Physical Exam  GENERAL:  65 y.o.-year-old patient lying in the bed with no acute distress. obese EYES: Pupils equal, round, reactive to light and accommodation. No scleral icterus. Extraocular muscles intact.  HEENT: Head atraumatic, normocephalic. Oropharynx and nasopharynx clear.  NECK:  Supple, no jugular venous distention. No thyroid enlargement, no tenderness.  LUNGS: Normal breath sounds bilaterally, no wheezing, rales, rhonchi. No use of accessory muscles of respiration.  CARDIOVASCULAR: S1, S2 normal. No murmurs,  rubs, or gallops.  ABDOMEN: Soft, nontender, nondistended. Bowel sounds present. No organomegaly or mass.  EXTREMITIES:severe bilateral lymphedema with nonhealing ulcers x2 on the left tibial shin and clf with cellulitis NEUROLOGIC: Cranial nerves II through XII are intact. No focal Motor or sensory deficits b/l.   PSYCHIATRIC: patient is alert and oriented x 3.  SKIN: No obvious rash, lesion, or ulcer.   LABORATORY PANEL:  CBC  Recent Labs Lab 08/17/16 0604  WBC 9.0  HGB 9.1*  HCT 27.5*  PLT 241    Chemistries   Recent Labs Lab 08/17/16 0604  NA 138  K 3.9  CL 106  CO2 26  GLUCOSE 175*  BUN 31*  CREATININE 1.17*  CALCIUM 8.1*    ASSESSMENT AND PLAN:  Brooke Hopkins  is a 65 y.o. female with a known history of Hypertension, diabetes, CVA and the CKD. She has chronic bilateral lower leg wounds and chronic bilateral lower extremity lymphedema, which has been worsening for the past 3 days in spite of outpatient antibiotics on Cipro. She was sent from wound care clinic to the ED for further treatment with IV antibiotics  *Bilateral leg wound infection and cellulitis, acute on chronic -on vancomycin and Zosyn, follow-up wound care consult  -cont lasix -pt's inactivity is not helping with her lymphedema -severe leg edema---will give IV lasix bid -WC GNR  *Hypertension. Continue clonidine, hydralazine, lisinopril and BB  * Diabetes. Continue NovoLog Mix 70/30 and start sliding scale.  *CKD 3, stable. -monitor creat with pt being on lasix -avoid nephrotoxins  Case discussed with Care Management/Social Worker. Management plans discussed with the patient, family and they are in agreement.  CODE STATUS: full  DVT Prophylaxis:lovenox TOTAL TIME TAKING CARE OF THIS  PATIENT: 30 minutes.  >50% time spent on counselling and coordination of care  POSSIBLE D/C IN 2-3 DAYS, DEPENDING ON CLINICAL CONDITION.  Note: This dictation was prepared with Dragon dictation along  with smaller phrase technology. Any transcriptional errors that result from this process are unintentional.  Tishina Lown M.D on 08/18/2016 at 2:40 PM  Between 7am to 6pm - Pager - [phone]  After 6pm go to [email or web address] - password [email or web address] [facility]  [place] [facility]  Office  [phone]  CC: Primary care physician; Leonel Ramsay, MD

## 2016-08-18 NOTE — Progress Notes (Signed)
   08/18/16 1242  PT Visit Information  Last PT Received On 08/18/16  Reason Eval/Treat Not Completed (Patient refused due to just getting IV placed)  PT went into room to start PT evaluation. Patient reports, "I just got my IV placed. I really don't want to move because every time I move my arm this IV just keeps going off. " Patient requested that PT come back later. Will re-attempt PT eval once patient is available.  Thank you for this referral. Hillis Range, PT, DPT, [phone] 08/18/16 12:43 PM

## 2016-08-19 ENCOUNTER — Inpatient Hospital Stay: Payer: 59

## 2016-08-19 LAB — GLUCOSE, CAPILLARY
Glucose-Capillary: 124 mg/dL — ABNORMAL HIGH (ref 65–99)
Glucose-Capillary: 161 mg/dL — ABNORMAL HIGH (ref 65–99)
Glucose-Capillary: 206 mg/dL — ABNORMAL HIGH (ref 65–99)
Glucose-Capillary: 202 mg/dL — ABNORMAL HIGH (ref 65–99)

## 2016-08-19 LAB — VANCOMYCIN, TROUGH: Vancomycin Tr: 21 ug/mL (ref 15–20)

## 2016-08-19 MED ORDER — TRAMADOL HCL 50 MG PO TABS: 50.0000 mg | ORAL_TABLET | Freq: Four times a day (QID) | ORAL | 0 refills | Status: DC | PRN

## 2016-08-19 MED ORDER — PIPERACILLIN-TAZOBACTAM 3.375 G IVPB
3.3750 g | Freq: Three times a day (TID) | INTRAVENOUS | Status: DC
  Filled 2016-08-19 (×2): qty 50

## 2016-08-19 MED ORDER — AMOXICILLIN-POT CLAVULANATE 875-125 MG PO TABS
1.0000 | ORAL_TABLET | Freq: Two times a day (BID) | ORAL | Status: DC
  Administered 2016-08-19: 1 via ORAL
  Filled 2016-08-19: qty 1

## 2016-08-19 MED ORDER — INSULIN NPH ISOPHANE & REGULAR (70-30) 100 UNIT/ML ~~LOC~~ SUSP: 35.0000 [IU] | Freq: Two times a day (BID) | SUBCUTANEOUS | 11 refills | Status: DC

## 2016-08-19 MED ORDER — VANCOMYCIN HCL IN DEXTROSE 750-5 MG/150ML-% IV SOLN
750.0000 mg | INTRAVENOUS | Status: DC
  Filled 2016-08-19: qty 150

## 2016-08-19 MED ORDER — DEXTROSE 5 % IV SOLN: 1.0000 g | Freq: Three times a day (TID) | INTRAVENOUS | 0 refills | Status: DC

## 2016-08-19 MED ORDER — DEXTROSE 5 % IV SOLN
1.0000 g | Freq: Three times a day (TID) | INTRAVENOUS | Status: DC
  Administered 2016-08-19: 1 g via INTRAVENOUS
  Filled 2016-08-19 (×4): qty 1

## 2016-08-19 NOTE — Progress Notes (Signed)
MAICEY, DESAUTEL ([id number]) Visit Report for 08/16/2016 Arrival Information Details Patient Name: Brooke Hopkins, Brooke Hopkins. Date of Service: 08/16/2016 8:00 AM Medical Record Number: [id number] Patient Account Number: [id number] Date of Birth/Sex: 07-02-1951 (64 y.o. Female) Treating RN: Ahmed Prima Primary Care Physician: FITZGERALD, DAVID Other Clinician: Referring Physician: FITZGERALD, DAVID Treating Physician/Extender: Benjaman Pott in Treatment: 8 Visit Information History Since Last Visit All ordered tests and consults were completed: No Patient Arrived: Wheel Chair Added or deleted any medications: No Arrival Time: 08:08 Any new allergies or adverse reactions: No Accompanied By: family Had a fall or experienced change in No Transfer Assistance: [employer] activities of daily living that may affect Patient Lift risk of falls: Patient Identification Verified: Yes Signs or symptoms of abuse/neglect since last No Secondary Verification Process Yes visito Completed: Hospitalized since last visit: No Patient Requires Transmission- No Pain Present Now: Yes Based Precautions: Patient Has Alerts: No Electronic Signature(s) Signed: 08/16/2016 4:59:06 PM By: Montey Hora Entered By: Montey Hora on 08/16/2016 08:51:01 [name] ([id number]) -------------------------------------------------------------------------------- Encounter Discharge Information Details Patient Name: Brooke Hopkins, Brooke Hopkins. Date of Service: 08/16/2016 8:00 AM Medical Record Number: [id number] Patient Account Number: [id number] Date of Birth/Sex: 01/08/1951 (64 y.o. Female) Treating RN: Ahmed Prima Primary Care Physician: FITZGERALD, DAVID Other Clinician: Referring Physician: FITZGERALD, DAVID Treating Physician/Extender: Benjaman Pott in Treatment: 8 Encounter Discharge Information Items Discharge Pain Level: 0 Discharge Condition: Stable Ambulatory Status:  Wheelchair Discharge Destination: Home Transportation: Private Auto Accompanied By: husband Schedule Follow-up Appointment: Yes Medication Reconciliation completed and provided to Patient/Care Yes Estil Vallee: Provided on Clinical Summary of Care: 08/16/2016 Form Type Recipient Paper Patient LB Electronic Signature(s) Signed: 08/16/2016 12:38:58 PM By: Judene Companion MD Previous Signature: 08/16/2016 9:14:44 AM Version By: Sharon Mt Entered By: Judene Companion on 08/16/2016 12:38:58 [name] ([id number]) -------------------------------------------------------------------------------- Lower Extremity Assessment Details Patient Name: Brooke Hopkins, Brooke Hopkins. Date of Service: 08/16/2016 8:00 AM Medical Record Number: [id number] Patient Account Number: [id number] Date of Birth/Sex: 01/12/1951 (64 y.o. Female) Treating RN: Ahmed Prima Primary Care Physician: FITZGERALD, DAVID Other Clinician: Referring Physician: FITZGERALD, DAVID Treating Physician/Extender: Judene Companion Weeks in Treatment: 8 Edema Assessment Assessed: [Left: No] [Right: No] E[Left: dema] [Right: :] Calf Left: Right: Point of Measurement: 32 cm From Medial Instep 54.4 cm 54.2 cm Ankle Left: Right: Point of Measurement: 11 cm From Medial Instep 34.6 cm 32.6 cm Vascular Assessment Pulses: Posterior Tibial Dorsalis Pedis Palpable: [Left:Yes] [Right:Yes] Extremity colors, hair growth, and conditions: Extremity Color: [Left:Red] [Right:Red] Temperature of Extremity: [Left:Warm] [Right:Hot] Capillary Refill: [Left:< 3 seconds] [Right:< 3 seconds] Toe Nail Assessment Left: Right: Thick: Yes Yes Discolored: Yes Yes Deformed: Yes Yes Improper Length and Hygiene: Yes Yes Electronic Signature(s) Signed: 08/16/2016 4:59:06 PM By: Montey Hora Signed: 08/19/2016 4:58:13 PM By: Alric Quan Entered By: Montey Hora on 08/16/2016 08:52:19 [name]  ([id number]) -------------------------------------------------------------------------------- Multi Wound Chart Details Patient Name: Brooke Hopkins, Brooke Hopkins. Date of Service: 08/16/2016 8:00 AM Medical Record Number: [id number] Patient Account Number: [id number] Date of Birth/Sex: 1951-11-01 (64 y.o. Female) Treating RN: Montey Hora Primary Care Physician: FITZGERALD, DAVID Other Clinician: Referring Physician: FITZGERALD, DAVID Treating Physician/Extender: Judene Companion Weeks in Treatment: 8 Vital Signs Height(in): 67 Pulse(bpm): 73 Weight(lbs): 300 Blood Pressure 167/43 (mmHg): Body Mass Index(BMI): 47 Temperature(F): 98.2 Respiratory Rate 22 (breaths/min): Photos: [1:No Photos] [2:No Photos] [N/A:N/A] Wound Location: [1:Left Lower Leg - Circumfernential] [2:Right Lower Leg - Circumfernential] [N/A:N/A] Wounding Event: [1:Gradually Appeared] [2:Gradually Appeared] [N/A:N/A] Primary Etiology: [1:Lymphedema] [2:Lymphedema] [N/A:N/A] Date Acquired: [1:12/31/2015] [2:12/31/2015] [N/A:N/A] Weeks  of Treatment: [1:8] [2:8] [N/A:N/A] Wound Status: [1:Open] [2:Open] [N/A:N/A] Clustered Wound: [1:No] [2:Yes] [N/A:N/A] Clustered Quantity: [1:N/A] [2:3] [N/A:N/A] Measurements L x W x D 10x14x0.3 [2:20x21x0.3] [N/A:N/A] (cm) Area (cm) : [1:109.956] [2:329.867] [N/A:N/A] Volume (cm) : [1:32.987] [2:98.96] [N/A:N/A] % Reduction in Area: [1:-9.40%] [2:-45.80%] [N/A:N/A] % Reduction in Volume: -64.10% [2:-118.70%] [N/A:N/A] Classification: [1:Partial Thickness] [2:Partial Thickness] [N/A:N/A] Exudate Amount: [1:Large] [2:Large] [N/A:N/A] Exudate Type: [1:Purulent] [2:Purulent] [N/A:N/A] Exudate Color: [1:yellow, brown, green] [2:yellow, brown, green] [N/A:N/A] Foul Odor After [1:Yes] [2:Yes] [N/A:N/A] Cleansing: Odor Anticipated Due to No [2:No] [N/A:N/A] Product Use: Wound Margin: [1:Flat and Intact] [2:Indistinct, nonvisible] [N/A:N/A] Granulation Amount: [1:None Present (0%)] [2:None  Present (0%)] [N/A:N/A] Necrotic Amount: [1:Large (67-100%)] [2:Large (67-100%)] [N/A:N/A] Exposed Structures: [1:Fascia: No Fat: No Tendon: No] [2:Fascia: No Fat: No Tendon: No] [N/A:N/A] Muscle: No Muscle: No Joint: No Joint: No Bone: No Bone: No Limited to Skin Limited to Skin Breakdown Breakdown Epithelialization: None Small (1-33%) N/A Periwound Skin Texture: Edema: Yes Edema: Yes N/A Scarring: Yes Scarring: Yes Excoriation: No Excoriation: No Induration: No Induration: No Callus: No Callus: No Crepitus: No Crepitus: No Fluctuance: No Fluctuance: No Friable: No Friable: No Rash: No Rash: No Periwound Skin Maceration: Yes Maceration: Yes N/A Moisture: Moist: Yes Moist: Yes Dry/Scaly: No Dry/Scaly: No Periwound Skin Color: Hemosiderin Staining: Yes Hemosiderin Staining: Yes N/A Atrophie Blanche: No Atrophie Blanche: No Cyanosis: No Cyanosis: No Ecchymosis: No Ecchymosis: No Erythema: No Erythema: No Mottled: No Mottled: No Pallor: No Pallor: No Rubor: No Rubor: No Temperature: No Abnormality No Abnormality N/A Tenderness on Yes Yes N/A Palpation: Wound Preparation: Ulcer Cleansing: Other: Ulcer Cleansing: Other: N/A soap and water soap and water Topical Anesthetic Topical Anesthetic Applied: Other: lidocaine Applied: Other: lidocaine 4% 4% Treatment Notes Electronic Signature(s) Signed: 08/16/2016 4:59:06 PM By: Montey Hora Entered By: Montey Hora on 08/16/2016 08:52:56 [name] ([id number]) -------------------------------------------------------------------------------- Multi-Disciplinary Care Plan Details Patient Name: Brooke Hopkins, Brooke Hopkins. Date of Service: 08/16/2016 8:00 AM Medical Record Number: [id number] Patient Account Number: [id number] Date of Birth/Sex: Jun 07, 1951 (64 y.o. Female) Treating RN: Montey Hora Primary Care Physician: FITZGERALD, DAVID Other Clinician: Referring Physician: FITZGERALD, DAVID Treating  Physician/Extender: Benjaman Pott in Treatment: 8 Active Inactive Orientation to the Wound Care Program Nursing Diagnoses: Knowledge deficit related to the wound healing center program Goals: Patient/caregiver will verbalize understanding of the [facility] Date Initiated: 06/21/2016 Goal Status: Active Interventions: Provide education on orientation to the wound center Notes: Wound/Skin Impairment Nursing Diagnoses: Impaired tissue integrity Goals: Patient/caregiver will verbalize understanding of skin care regimen Date Initiated: 06/21/2016 Goal Status: Active Ulcer/skin breakdown will have a volume reduction of 30% by week 4 Date Initiated: 06/21/2016 Goal Status: Active Ulcer/skin breakdown will have a volume reduction of 50% by week 8 Date Initiated: 06/21/2016 Goal Status: Active Ulcer/skin breakdown will have a volume reduction of 80% by week 12 Date Initiated: 06/21/2016 Goal Status: Active Ulcer/skin breakdown will heal within 14 weeks Date Initiated: 06/21/2016 Goal Status: Active ALVERN, CRASK ([id number]) Interventions: Assess patient/caregiver ability to obtain necessary supplies Assess patient/caregiver ability to perform ulcer/skin care regimen upon admission and as needed Assess ulceration(s) every visit Provide education on ulcer and skin care Notes: Electronic Signature(s) Signed: 08/16/2016 4:59:06 PM By: Montey Hora Entered By: Montey Hora on 08/16/2016 08:52:46 [name] ([id number]) -------------------------------------------------------------------------------- Pain Assessment Details Patient Name: Brooke Hopkins, Brooke Hopkins. Date of Service: 08/16/2016 8:00 AM Medical Record Number: [id number] Patient Account Number: [id number] Date of Birth/Sex: July 07, 1951 (64 y.o. Female) Treating RN: Carolyne Fiscal, Debi  Primary Care Physician: FITZGERALD, DAVID Other Clinician: Referring Physician: FITZGERALD, DAVID Treating  Physician/Extender: Judene Companion Weeks in Treatment: 8 Active Problems Location of Pain Severity and Description of Pain Patient Has Paino Yes Site Locations Pain Location: Pain in Ulcers With Dressing Change: Yes Duration of the Pain. Constant / Intermittento Constant Rate the pain. Current Pain Level: 5 Worst Pain Level: 8 Least Pain Level: 3 Character of Pain Describe the Pain: Aching, Tender, Throbbing Pain Management and Medication Current Pain Management: Electronic Signature(s) Signed: 08/16/2016 4:59:06 PM By: Montey Hora Signed: 08/19/2016 4:58:13 PM By: Alric Quan Entered By: Montey Hora on 08/16/2016 08:51:50 [name] ([id number]) -------------------------------------------------------------------------------- Patient/Caregiver Education Details Patient Name: Brooke Hopkins, Brooke Hopkins. Date of Service: 08/16/2016 8:00 AM Medical Record Number: [id number] Patient Account Number: [id number] Date of Birth/Gender: [birth year] (64 y.o. Female) Treating RN: Montey Hora Primary Care Physician: FITZGERALD, DAVID Other Clinician: Referring Physician: FITZGERALD, DAVID Treating Physician/Extender: Benjaman Pott in Treatment: 8 Education Assessment Education Provided To: Patient and Caregiver Education Topics Provided Infection: Handouts: Other: reportable s/s and go to the ED if needed Methods: Explain/Verbal Responses: State content correctly Wound/Skin Impairment: Handouts: Other: do not get wraps wet Methods: Demonstration, Explain/Verbal Responses: State content correctly Electronic Signature(s) Signed: 08/16/2016 4:57:01 PM By: Judene Companion MD Entered By: Judene Companion on 08/16/2016 12:39:06 [name] ([id number]) -------------------------------------------------------------------------------- Wound Assessment Details Patient Name: Brooke Hopkins, Brooke Hopkins. Date of Service: 08/16/2016 8:00 AM Medical Record Number:  [id number] Patient Account Number: [id number] Date of Birth/Sex: 05-18-1951 (64 y.o. Female) Treating RN: Ahmed Prima Primary Care Physician: FITZGERALD, DAVID Other Clinician: Referring Physician: FITZGERALD, DAVID Treating Physician/Extender: Frann Rider in Treatment: 8 Wound Status Wound Number: 1 Primary Etiology: Lymphedema Wound Location: Left Lower Leg - Wound Status: Open Circumfernential Wounding Event: Gradually Appeared Date Acquired: 12/31/2015 Weeks Of Treatment: 8 Clustered Wound: No Photos Photo Uploaded By: Alric Quan on 08/16/2016 15:27:13 Wound Measurements Length: (cm) 10 Width: (cm) 14 Depth: (cm) 0.3 Area: (cm) 109.956 Volume: (cm) 32.987 % Reduction in Area: -9.4% % Reduction in Volume: -64.1% Epithelialization: None Tunneling: No Undermining: No Wound Description Classification: Partial Thickness Wound Margin: Flat and Intact Exudate Amount: Large Exudate Type: Purulent Exudate Color: yellow, brown, green Foul Odor After Cleansing: Yes Due to Product Use: No Wound Bed Granulation Amount: None Present (0%) Exposed Structure Necrotic Amount: Large (67-100%) Fascia Exposed: No Necrotic Quality: Adherent Slough Fat Layer Exposed: No Brooke Hopkins, Brooke Hopkins ([id number]) Tendon Exposed: No Muscle Exposed: No Joint Exposed: No Bone Exposed: No Limited to Skin Breakdown Periwound Skin Texture Texture Color No Abnormalities Noted: No No Abnormalities Noted: No Callus: No Atrophie Blanche: No Crepitus: No Cyanosis: No Excoriation: No Ecchymosis: No Fluctuance: No Erythema: No Friable: No Hemosiderin Staining: Yes Induration: No Mottled: No Localized Edema: Yes Pallor: No Rash: No Rubor: No Scarring: Yes Temperature / Pain Moisture Temperature: No Abnormality No Abnormalities Noted: No Tenderness on Palpation: Yes Dry / Scaly: No Maceration: Yes Moist: Yes Wound Preparation Ulcer Cleansing: Other: soap and  water, Topical Anesthetic Applied: Other: lidocaine 4%, Treatment Notes Wound #1 (Left, Circumferential Lower Leg) 1. Cleansed with: Cleanse wound with antibacterial soap and water 2. Anesthetic Topical Lidocaine 4% cream to wound bed prior to debridement 4. Dressing Applied: Aquacel Ag 5. Secondary Dressing Applied ABD Pad Dry Gauze 7. Secured with Tape 3 Layer Compression System - Bilateral Notes xtrasorb, tritec silver wrap Electronic Signature(s) Brooke Hopkins, Brooke Hopkins ([id number]) Signed: 08/19/2016 4:58:13 PM By: Alric Quan Entered By: Alric Quan on 08/16/2016 08:38:07 Brooke Hopkins,  Brooke Hopkins ([id number]) -------------------------------------------------------------------------------- Wound Assessment Details Patient Name: Brooke Hopkins, LUNGREN. Date of Service: 08/16/2016 8:00 AM Medical Record Number: [id number] Patient Account Number: [id number] Date of Birth/Sex: Jun 23, 1951 (64 y.o. Female) Treating RN: Ahmed Prima Primary Care Physician: FITZGERALD, DAVID Other Clinician: Referring Physician: FITZGERALD, DAVID Treating Physician/Extender: Frann Rider in Treatment: 8 Wound Status Wound Number: 2 Primary Etiology: Lymphedema Wound Location: Right Lower Leg - Wound Status: Open Circumfernential Wounding Event: Gradually Appeared Date Acquired: 12/31/2015 Weeks Of Treatment: 8 Clustered Wound: Yes Photos Photo Uploaded By: Alric Quan on 08/16/2016 15:27:14 Wound Measurements Length: (cm) 20 Width: (cm) 21 Depth: (cm) 0.3 Clustered Quantity: 3 Area: (cm) 329.867 Volume: (cm) 98.96 % Reduction in Area: -45.8% % Reduction in Volume: -118.7% Epithelialization: Small (1-33%) Tunneling: No Undermining: No Wound Description Classification: Partial Thickness Wound Margin: Indistinct, nonvisible Exudate Amount: Large Exudate Type: Purulent Exudate Color: yellow, brown, green Foul Odor After Cleansing: Yes Due to Product Use: No Wound  Bed Granulation Amount: None Present (0%) Exposed Structure Necrotic Amount: Large (67-100%) Fascia Exposed: No MYFANWY, SIMKIN ([id number]) Necrotic Quality: Adherent Slough Fat Layer Exposed: No Tendon Exposed: No Muscle Exposed: No Joint Exposed: No Bone Exposed: No Limited to Skin Breakdown Periwound Skin Texture Texture Color No Abnormalities Noted: No No Abnormalities Noted: No Callus: No Atrophie Blanche: No Crepitus: No Cyanosis: No Excoriation: No Ecchymosis: No Fluctuance: No Erythema: No Friable: No Hemosiderin Staining: Yes Induration: No Mottled: No Localized Edema: Yes Pallor: No Rash: No Rubor: No Scarring: Yes Temperature / Pain Moisture Temperature: No Abnormality No Abnormalities Noted: No Tenderness on Palpation: Yes Dry / Scaly: No Maceration: Yes Moist: Yes Wound Preparation Ulcer Cleansing: Other: soap and water, Topical Anesthetic Applied: Other: lidocaine 4%, Treatment Notes Wound #2 (Right, Circumferential Lower Leg) 1. Cleansed with: Cleanse wound with antibacterial soap and water 2. Anesthetic Topical Lidocaine 4% cream to wound bed prior to debridement 4. Dressing Applied: Aquacel Ag 5. Secondary Dressing Applied ABD Pad Dry Gauze 7. Secured with Tape 3 Layer Compression System - Bilateral Notes xtrasorb, tritec silver wrap ALYZAH, TREVINO ([id number]) Electronic Signature(s) Signed: 08/19/2016 4:58:13 PM By: Alric Quan Entered By: Alric Quan on 08/16/2016 08:38:33 [name] ([id number]) -------------------------------------------------------------------------------- Vitals Details Patient Name: EMYLIE, POPPERT. Date of Service: 08/16/2016 8:00 AM Medical Record Number: [id number] Patient Account Number: [id number] Date of Birth/Sex: 06-24-1951 (64 y.o. Female) Treating RN: Ahmed Prima Primary Care Physician: FITZGERALD, DAVID Other Clinician: Referring Physician: FITZGERALD,  DAVID Treating Physician/Extender: Judene Companion Weeks in Treatment: 8 Vital Signs Time Taken: 08:12 Temperature (F): 98.2 Height (in): 67 Pulse (bpm): 73 Weight (lbs): 300 Respiratory Rate (breaths/min): 22 Body Mass Index (BMI): 47 Blood Pressure (mmHg): 167/43 Reference Range: 80 - 120 mg / dl Electronic Signature(s) Signed: 08/16/2016 4:59:06 PM By: Montey Hora Entered By: Montey Hora on 08/16/2016 08:51:55

## 2016-08-19 NOTE — Progress Notes (Signed)
[facility] gave foolow-up call estimated arrival between 8 to 8:30pm. Patient to receive initial antibiotic today and be discharged.

## 2016-08-19 NOTE — Discharge Instructions (Signed)
PICC line care per protocol Dressing changes per [place]

## 2016-08-19 NOTE — Progress Notes (Signed)
Infectious Disease Long Term IV Antibiotic Orders  Diagnosis: LE wound, Pseuodmonas  Culture results Specimen Description LEG RIGHT  Special Requests NONE  Gram Stain RARE WBC PRESENT, PREDOMINANTLY PMN  NO ORGANISMS SEEN  Performed at [facility]  NO ANAEROBES ISOLATED; CULTURE IN PROGRESS FOR 5 DAYS   Report Status PENDING  Organism ID, Bacteria PSEUDOMONAS AERUGINOSA  Resulting Agency [facility]  Susceptibility    Pseudomonas aeruginosa    MIC    CEFEPIME 16 INTERMED... Intermediate    CEFTAZIDIME 4 SENSITIVE  Sensitive    CIPROFLOXACIN >=4 RESISTANT  Resistant    GENTAMICIN 2 SENSITIVE  Sensitive    IMIPENEM 2 SENSITIVE  Sensitive    PIP/TAZO 8 SENSITIVE  Sensitive        Allergies: No Known Allergies  Discharge antibiotics  ceftazidime 1 gm IV q 12 hours  PICC Care per protocol Labs weekly while on IV antibiotics      CBC w diff   Comprehensive met panel   Planned duration of antibiotics 21 days   Stop date Sept 11  Follow up clinic date TBD within 3 weks  FAX weekly labs to [phone]  Leonel Ramsay, MD

## 2016-08-19 NOTE — Progress Notes (Signed)
NYJA, RINDT ([id number]) Visit Report for 08/16/2016 Chief Complaint Document Details Patient Name: Brooke Hopkins, Brooke Hopkins. Date of Service: 08/16/2016 8:00 AM Medical Record Number: [id number] Patient Account Number: [id number] Date of Birth/Sex: 1951-05-09 (65 y.o. Female) Treating RN: Ahmed Prima Primary Care Physician: FITZGERALD, DAVID Other Clinician: Referring Physician: FITZGERALD, DAVID Treating Physician/Extender: Frann Rider in Treatment: 8 Information Obtained from: Patient Chief Complaint Patients presents for treatment of an open diabetic ulcer and significant lymphedema which she's had for about two years Electronic Signature(s) Signed: 08/16/2016 12:35:33 PM By: Judene Companion MD Signed: 08/19/2016 4:08:58 PM By: Christin Fudge MD, FACS Previous Signature: 08/16/2016 8:03:22 AM Version By: Judene Companion MD Entered By: Judene Companion on 08/16/2016 12:35:33 IMAAN, RIPPER ([id number]) -------------------------------------------------------------------------------- HPI Details Patient Name: Brooke Hopkins. Date of Service: 08/16/2016 8:00 AM Medical Record Number: [id number] Patient Account Number: [id number] Date of Birth/Sex: [birth year] (65 y.o. Female) Treating RN: Ahmed Prima Primary Care Physician: FITZGERALD, DAVID Other Clinician: Referring Physician: FITZGERALD, DAVID Treating Physician/Extender: Frann Rider in Treatment: 8 History of Present Illness Location: massive swelling of both lower extremities and ulceration both lower extremities Quality: Patient reports experiencing a dull pain to affected area(s). Severity: Patient states wound are getting worse. Duration: Patient has had the wound for >2 years prior to seeking treatment at the wound center Timing: Pain in wound is constant (hurts all the time) Context: The wound appeared gradually over time Modifying Factors: Other treatment(s) tried include:she has a lymphedema pump  but uses it seldom and she's had several course of antibiotics Associated Signs and Symptoms: Patient reports having difficulty standing for long periods. HPI Description: 65 year old patient seen by Dr. Ola Spurr of infectious disease who has been following up for left lower extremity cellulitis and ulcer with recurrent bilateral lower extremity problems for several months. Recently she had a large right lower extremity bullae which opened out and has been ulcerated. She has seen the vascular group and has been getting Unna's wraps and has a lymphedema pump used in the past. Her prior cultures were positive for Pseudomonas, Proteus and was treated with amoxicillin. He has also been treated with 2 weeks course of ciprofloxacin and amoxicillin.. Increase of Lasix dose helped with the edema and echo showed no systolic CHF but may be diastolic problems. Past medical history significant for diabetes mellitus type 2, venous stasis ulcer, obesity, diabetic peripheral neuropathy, status post back surgery, cholecystectomy, hysterectomy, arthroscopy of the knee. He is a former smoker and quit smoking in 1984. The patient has seen Dr. Delana Meyer who did not recommend any arterial or venous duplex studies and has been using Unna's wraps and also recommended a lymphedema pump. she has been very noncompliant with using these. 06/28/2016 -- the patient is still on antibiotics as prescribed by Dr. Ola Spurr and he is asked her to take it for 3 weeks. The patient also says she has a lot of redness and pain in the folds of her thigh and lower extremity and this is very painful. is still on antibiotics as prescribed by Dr. Ola Spurr and he is asked her to take it for 3 weeks. The patient also says she has a lot of redness and pain in the folds of her thigh and lower extremity and this is very painful. 07/26/2016 -- the patient is off antibiotics and has been getting dressing changes 3 times a week. 08/09/2016 -- is been on Cipro and is taking potassium supplements along with her Lasix and is going to be seeing Dr. Ola Spurr for a consult return visit only in November 2017 Electronic Signature(s) Signed: 08/16/2016 12:35:46 PM By:  Judene Companion MD Signed: 08/19/2016 4:08:58 PM By: Christin Fudge MD, FACS Previous Signature: 08/16/2016 8:04:05 AM Version By: Judene Companion MD Entered By: Judene Companion on 08/16/2016  12:35:46 AYSE, SPROUSE ([name], [name] ([id number]) -------------------------------------------------------------------------------- Physical Exam Details Patient Name: Brooke Hopkins. Date of Service: 08/16/2016 8:00 AM Medical Record Number: [id number] Patient Account Number: [id number] Date of Birth/Sex: May 05, 1951 (65 y.o. Female) Treating RN: Ahmed Prima Primary Care Physician: FITZGERALD, DAVID Other Clinician: Referring Physician: FITZGERALD, DAVID Treating Physician/Extender: Frann Rider in Treatment: 8 Electronic Signature(s) Signed: 08/16/2016 12:36:07 PM By: Judene Companion MD Signed: 08/19/2016 4:08:58 PM By: Christin Fudge MD, FACS Previous Signature: 08/16/2016 8:04:13 AM Version By: Judene Companion MD Entered By: Judene Companion on 08/16/2016 12:36:07 [name] ([id number]) -------------------------------------------------------------------------------- Physician Orders Details Patient Name: Brooke Hopkins. Date of Service: 08/16/2016 8:00 AM Medical Record Number: [id number] Patient Account Number: [id number] Date of Birth/Sex: August 01, 1951 (65 y.o. 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 o May shower with protection. Wound #2 Right,Circumferential Lower Leg o Cleanse wound with mild soap and water o May Shower, gently pat wound dry prior to applying new dressing. o May shower with protection. Anesthetic Wound #1 Left,Circumferential Lower Leg o Topical Lidocaine 4% cream applied to wound bed prior to debridement Wound #2 Right,Circumferential Lower Leg o Topical Lidocaine 4% cream applied to wound bed prior to debridement Skin Barriers/Peri-Wound Care Wound #1 Left,Circumferential Lower Leg o Other: - Lotrisone cream in folds at knees and on reddened areas on legs (Nystatin used in clinic) Wound #2 Right,Circumferential Lower Leg o Other: - Lotrisone cream in folds at knees and on reddened areas on legs (Nystatin used in clinic) Primary Wound Dressing CLEON, MENARD ([id number]) Wound #1 Left,Circumferential Lower Leg o Aquacel Ag - Tritec silver sent with patient and may be used for 1 dressing change Wound #2 Right,Circumferential Lower Leg o Aquacel Ag - Tritec silver sent with patient and may be used for 1 dressing change Secondary Dressing Wound #1 Left,Circumferential Lower Leg o ABD pad o XtraSorb Wound #2 Right,Circumferential Lower Leg o ABD pad o XtraSorb Dressing Change Frequency Wound #1 Left,Circumferential Lower Leg o Change Dressing Monday, Wednesday, Friday Wound #2 Right,Circumferential Lower Leg o Change Dressing Monday, Wednesday, Friday Follow-up Appointments Wound #1 Left,Circumferential Lower Leg o Return Appointment in 1 week. Wound #2 Right,Circumferential Lower Leg o Return Appointment in 1 week. Edema Control Wound #1 Left,Circumferential Lower Leg o 3 Layer Compression  System - Bilateral - May anchor top of wrap with dome paste, but only wrap around leg one time. o Elevate legs to the level of the heart and pump ankles as often as possible Wound #2 Right,Circumferential Lower Leg o 3 Layer Compression System - Bilateral - May anchor top of wrap with dome paste, but only wrap around leg one time. o Elevate legs to the level of the heart and pump ankles as often as possible Home Health Wound #1 Left,Circumferential Lower Leg o [place] Visits - [facility]   o Home Health Nurse may visit PRN to address patientos wound care needs. o FACE TO FACE ENCOUNTER: MEDICARE and MEDICAID PATIENTS: I certify that this patient is under my care and that I had a face-to-face encounter that meets the physician face-to-face encounter requirements with this patient on this date. The encounter with the patient was in [place]. ([id number]) whole or in part for the following MEDICAL CONDITION: (primary reason for [place]) MEDICAL NECESSITY: I certify, that based on my findings, NURSING services are a medically necessary home health service. HOME BOUND STATUS: I certify that my clinical findings support that this patient is homebound (i.e., Due to illness or injury, pt requires aid of supportive devices such as crutches, cane, wheelchairs, walkers, the use of special transportation or the assistance of another person to leave their place of residence. There is a normal inability to leave the home and doing so requires considerable and taxing effort. Other absences are for medical reasons / religious services and are infrequent or of short duration when for other reasons). o Please direct any NON-WOUND related issues/requests for orders to patient's Primary Care Physician Wound #2 Right,Circumferential Lower Leg o [place] Nurse may visit PRN to address patientos wound care needs. o FACE TO FACE  ENCOUNTER: MEDICARE and MEDICAID PATIENTS: I certify that this patient is under my care and that I had a face-to-face encounter that meets the physician face-to-face encounter requirements with this patient on this date. The encounter with the patient was in whole or in part for the following MEDICAL CONDITION: (primary reason for [place]) MEDICAL NECESSITY: I certify, that based on my findings, NURSING services are a medically necessary home health service. HOME BOUND STATUS: I certify that my clinical findings support that this patient is homebound (i.e., Due to illness or injury, pt requires aid of supportive devices such as crutches, cane, wheelchairs, walkers, the use of special transportation or the assistance of another person to leave their place of residence. There is a normal inability to leave the home and doing so requires considerable and taxing effort. Other absences are for medical reasons / religious services and are infrequent or of short duration when for other reasons). o Please direct any NON-WOUND related issues/requests for orders to patient's Primary Care Physician Medications-please add to medication list. Wound #2 Right,Circumferential Lower Leg o P.O. Antibiotics - Cipro 500 mg BID for 15 days Electronic Signature(s) Signed: 08/16/2016 4:57:01 PM By: Judene Companion MD Signed: 08/19/2016 4:58:13 PM By: Alric Quan Entered By: Alric Quan on 08/16/2016 11:34:33 [name] ([id number]) -------------------------------------------------------------------------------- Problem List Details Patient Name: SHRADDHA, HORRY. Date of Service: 08/16/2016 8:00 AM Medical Record Number: [id number] Patient Account Number: [id number] Date of Birth/Sex: Jun 07, 1951 (65 y.o. Female) Treating RN: Ahmed Prima Primary Care Physician: FITZGERALD, DAVID Other Clinician: Referring Physician: FITZGERALD, DAVID Treating Physician/Extender: Frann Rider in Treatment: 8 Active Problems ICD-10 Encounter Code Description Active Date Diagnosis E11.622 Type 2 diabetes mellitus with other skin ulcer 06/21/2016 Yes I89.0 Lymphedema, not elsewhere classified 06/21/2016 Yes L97.222 Non-pressure chronic ulcer of left calf with fat layer 06/21/2016 Yes exposed L97.212 Non-pressure chronic ulcer of right calf with fat layer 06/21/2016 Yes exposed E66.01 Morbid (severe) obesity due to excess calories 06/21/2016 Yes B37.2 Candidiasis of skin and nail 06/28/2016 Yes Inactive Problems Resolved Problems Electronic Signature(s) Signed: 08/16/2016 8:50:19 AM By: Judene Companion MD Signed: 08/19/2016 4:08:58 PM By: Christin Fudge MD, FACS Previous Signature: 08/16/2016 8:02:16 AM Version  By: Judene Companion MD Entered By: Judene Companion on 08/16/2016 08:50:19 [name] ([id number]) -------------------------------------------------------------------------------- Progress Note Details Patient Name: TIMEA, KOZISEK. Date of Service: 08/16/2016 8:00 AM Medical Record Number: [id number] Patient Account Number: [id number] Date of Birth/Sex: [birth year] (65 y.o. Female) Treating RN: Ahmed Prima Primary Care Physician: FITZGERALD, DAVID Other Clinician: Referring Physician: FITZGERALD, DAVID Treating Physician/Extender: Benjaman Pott in Treatment: 8 Subjective Chief Complaint Information obtained from Patient Patients presents for treatment of an open diabetic ulcer and significant lymphedema which she's had for about two years History of Present Illness (HPI) The following HPI elements were documented for the patient's wound: Location: massive swelling of both lower extremities and ulceration both lower extremities Quality: Patient reports experiencing a dull pain to affected area(s). Severity: Patient states wound are getting worse. Duration: Patient has had the wound for >2 years prior to seeking treatment at the wound  center Timing: Pain in wound is constant (hurts all the time) Context: The wound appeared gradually over time Modifying Factors: Other treatment(s) tried include:she has a lymphedema pump but uses it seldom and she's had several course of antibiotics Associated Signs and Symptoms: Patient reports having difficulty standing for long periods. 65 year old patient seen by Dr. Ola Spurr of infectious disease who has been following up for left lower extremity cellulitis and ulcer with recurrent bilateral lower extremity problems for several months. Recently she had a large right lower extremity bullae which opened out and has been ulcerated. She has seen the vascular group and has been getting Unna's wraps and has a lymphedema pump used in the past. Her prior cultures were positive for Pseudomonas, Proteus and was treated with amoxicillin. He has also been treated with 2 weeks course of ciprofloxacin and amoxicillin.. Increase of Lasix dose helped with the edema and echo showed no systolic CHF but may be diastolic problems. Past medical history significant for diabetes mellitus type 2, venous stasis ulcer, obesity, diabetic peripheral neuropathy, status post back surgery, cholecystectomy, hysterectomy, arthroscopy of the knee. He is a former smoker and quit smoking in 1984. The patient has seen Dr. Delana Meyer who did not recommend any arterial or venous duplex studies and has been using Unna's wraps and also recommended a lymphedema pump. she has been very noncompliant with using these. 06/28/2016 -- the patient is still on antibiotics as prescribed by Dr. Ola Spurr and he is asked her to take it for 3 weeks. The patient also says she has a lot of redness and pain in the folds of her thigh and lower extremity and this is very painful. is still on antibiotics as prescribed by Dr. Ola Spurr and he is asked her to take it for 3 weeks. The patient also says she has a lot of redness and pain in the folds of her thigh and lower extremity and this is very painful. 07/26/2016 -- the patient is off antibiotics and has been getting dressing changes 3 times a week. 08/09/2016 -- is been on Cipro and is taking potassium supplements  along with her Lasix and is going to be JANELLEN, DOAKES ([id number]) seeing Dr. Ola Spurr for a consult return visit only in November 2017 Objective Constitutional Vitals Time Taken: 8:12 AM, Height: 67 in, Weight: 300 lbs, BMI: 47, Temperature: 98.2 F, Pulse: 73 bpm, Respiratory Rate: 22 breaths/min, Blood Pressure: 167/43 mmHg. Integumentary (Hair, Skin) Wound #1 status is Open. Original cause of wound was Gradually Appeared. The wound is located on the Left,Circumferential Lower Leg. The wound measures 10cm length x 14cm width x 0.3cm depth; 109.956cm^2 area and 32.987cm^3 volume. The wound is limited to skin breakdown. There is no tunneling or undermining noted. There is a large amount of purulent drainage noted. The wound margin is flat and intact. There is no granulation within the wound  bed. There is a large (67-100%) amount of necrotic tissue within the wound bed including Adherent Slough. The periwound skin appearance exhibited: Localized Edema, Scarring, Maceration, Moist, Hemosiderin Staining. The periwound skin appearance did not exhibit: Callus, Crepitus, Excoriation, Fluctuance, Friable, Induration, Rash, Dry/Scaly, Atrophie Blanche, Cyanosis, Ecchymosis, Mottled, Pallor, Rubor, Erythema. Periwound temperature was noted as No Abnormality. The periwound has tenderness on palpation. Wound #2 status is Open. Original cause of wound was Gradually Appeared. The wound is located on the Right,Circumferential Lower Leg. The wound measures 20cm length x 21cm width x 0.3cm depth; 329.867cm^2 area and 98.96cm^3 volume. The wound is limited to skin breakdown. There is no tunneling or undermining noted. There is a large amount of purulent drainage noted. The wound margin is indistinct and nonvisible. There is no granulation within the wound bed. There is a large (67-100%) amount of necrotic tissue within the wound bed including Adherent Slough. The periwound skin appearance  exhibited: Localized Edema, Scarring, Maceration, Moist, Hemosiderin Staining. The periwound skin appearance did not exhibit: Callus, Crepitus, Excoriation, Fluctuance, Friable, Induration, Rash, Dry/Scaly, Atrophie Blanche, Cyanosis, Ecchymosis, Mottled, Pallor, Rubor, Erythema. Periwound temperature was noted as No Abnormality. The periwound has tenderness on palpation. 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.212 - Non-pressure chronic ulcer of right calf with fat layer exposed E66.01 - Morbid (severe) obesity due to excess calories ROSALINDA, ROMANSKY. ([id number]) B37.2 - Candidiasis of skin and nail Plan Wound Cleansing: Wound #1 Left,Circumferential Lower Leg: Cleanse wound with mild soap and water May Shower, gently pat wound dry prior to applying new dressing. May shower with protection. Wound #2 Right,Circumferential Lower Leg: Cleanse wound with mild soap and water May Shower, gently pat wound dry prior to applying new dressing. May shower with protection. Anesthetic: Wound #1 Left,Circumferential Lower Leg: Topical Lidocaine 4% cream applied to wound bed prior to debridement Wound #2 Right,Circumferential Lower Leg: Topical Lidocaine 4% cream applied to wound bed prior to debridement Skin Barriers/Peri-Wound Care: Wound #1 Left,Circumferential Lower Leg: Other: - Lotrisone cream in folds at knees and on reddened areas on legs (Nystatin used in clinic) Wound #2 Right,Circumferential Lower Leg: Other: - Lotrisone cream in folds at knees and on reddened areas on legs (Nystatin used in clinic) Primary Wound Dressing: Wound #1 Left,Circumferential Lower Leg: Aquacel Ag - Tritec silver sent with patient and may be used for 1 dressing change Wound #2 Right,Circumferential Lower Leg: Aquacel Ag - Tritec  silver sent with patient and may be used for 1 dressing change Secondary Dressing: Wound #1 Left,Circumferential Lower Leg: ABD pad XtraSorb Wound #2 Right,Circumferential Lower Leg: ABD pad XtraSorb Dressing Change Frequency: Wound #1 Left,Circumferential Lower Leg: Change Dressing Monday, Wednesday, Friday Wound #2 Right,Circumferential Lower Leg: Change Dressing Monday, Wednesday, Friday Follow-up Appointments: Wound #1 Left,Circumferential Lower Leg: Return Appointment in 1 week. Wound #2 Right,Circumferential Lower Leg: Return Appointment in 1 week. MEILI, [id number] ([id number]) Edema Control: Wound #1 Left,Circumferential Lower Leg: 3 Layer Compression System - Bilateral - May anchor top of wrap with dome paste, but only wrap around leg one time. Elevate legs to the level of the heart and pump ankles as often as possible Wound #2 Right,Circumferential Lower Leg: 3 Layer Compression System - Bilateral - May anchor top of wrap with dome paste, but only wrap around leg one time. Elevate legs to the level of the heart  and pump ankles as often as possible Home Health: Wound #1 Left,Circumferential Lower Leg: Continue Home Health Visits - [place] Nurse may visit PRN to address patient s wound care needs. FACE TO FACE ENCOUNTER: MEDICARE and MEDICAID PATIENTS: I certify that this patient is under my care and that I had a face-to-face encounter that meets the physician face-to-face encounter requirements with this patient on this date. The encounter with the patient was in whole or in part for the following MEDICAL CONDITION: (primary reason for [place]) MEDICAL NECESSITY: I certify, that based on my findings, NURSING services are a medically necessary home health service. HOME BOUND STATUS: I certify that my clinical findings support that this patient is homebound (i.e., Due to illness or injury, pt requires aid of supportive devices such as crutches, cane,  wheelchairs, walkers, the use of special transportation or the assistance of another person to leave their place of residence. There is a normal inability to leave the home and doing so requires considerable and taxing effort. Other absences are for medical reasons / religious services and are infrequent or of short duration when for other reasons). Please direct any NON-WOUND related issues/requests for orders to patient's Primary Care Physician Wound #2 Right,Circumferential Lower Leg: [place] Nurse may visit PRN to address patient s wound care needs. FACE TO FACE ENCOUNTER: MEDICARE and MEDICAID PATIENTS: I certify that this patient is under my care and that I had a face-to-face encounter that meets the physician face-to-face encounter requirements with this patient on this date. The encounter with the patient was in whole or in part for the following MEDICAL CONDITION: (primary reason for [place]) MEDICAL NECESSITY: I certify, that based on my findings, NURSING services are a medically necessary home health service. HOME BOUND STATUS: I certify that my clinical findings support that this patient is homebound (i.e., Due to illness or injury, pt requires aid of supportive devices such as crutches, cane, wheelchairs, walkers, the use of special transportation or the assistance of another person to leave their place of residence. There is a normal inability to leave the home and doing so requires considerable and taxing effort. Other absences are for medical reasons / religious services and are infrequent or of short duration when for other reasons). Please direct any NON-WOUND related issues/requests for orders to patient's Primary Care Physician Medications-please add to medication list.: Wound #2 Right,Circumferential Lower Leg: P.O. Antibiotics - Cipro 500 mg BID for 15 days Follow-Up Appointments: A follow-up appointment should be  scheduled. Medication Reconciliation completed and provided to Patient/Care Provider. A Patient Clinical Summary of Care was provided to [place] ([id number]) Obese patient with diabetic ulcer and venous ulcers bilateral legs. Treat with aquacel and 3 layer wrap Electronic Signature(s) Signed: 08/16/2016 12:38:06 PM By: Judene Companion MD Entered By: Judene Companion on 08/16/2016 12:38:06 [name] ([id number]) -------------------------------------------------------------------------------- SuperBill Details Patient Name: IZETTA, BARRANCO. Date of Service: 08/16/2016 Medical Record Number: [id number] Patient Account Number: [id number] Date of Birth/Sex: 10-22-1951 (65 y.o. Female) Treating RN: Montey Hora Primary Care Physician: FITZGERALD, DAVID Other Clinician: Referring Physician: FITZGERALD, DAVID Treating Physician/Extender: Judene Companion Weeks in Treatment: 8 Diagnosis Coding ICD-10 Codes Code Description E11.622 Type 2 diabetes mellitus with other skin ulcer I89.0 Lymphedema, not elsewhere classified L97.222 Non-pressure chronic ulcer of left calf with fat layer exposed L97.212 Non-pressure chronic ulcer of right calf with fat layer exposed E66.01 Morbid (severe) obesity due to excess calories B37.2 Candidiasis of  skin and nail Facility Procedures CPT4: Description Modifier Quantity Code [id number] BILATERAL: Application of multi-layer venous compression 1 system; leg (below knee), including ankle and foot. Physician Procedures CPT4 Code: [id number] Description: [phone] - WC PHYS LEVEL 2 - EST PT ICD-10 Description Diagnosis E11.622 Type 2 diabetes mellitus with other skin ulcer Modifier: Quantity: 1 Electronic Signature(s) Signed: 08/16/2016 12:38:29 PM By: Judene Companion MD Previous Signature: 08/16/2016 9:04:36 AM Version By: Montey Hora Entered By: Judene Companion on 08/16/2016 12:38:29

## 2016-08-19 NOTE — Consult Note (Addendum)
[facility] Infectious Disease     Reason for Consult: LE celluliits, Pseudomonas R to cipro   Referring Physician:  Nicholes Mango Date of Admission:  08/16/2016   Principal Problem:   Wound infection [facility])   HPI: Brooke Hopkins is a 65 y.o. female well known to me with chronic lymphedema and chronic LE wounds admitted with worsening infection with cultures showing resistant pseudomonas (done 8/18)/. Had been off and on cipro with little benefit. On admit wbc 11. No fevers, startd on zosyn and vanco and some improvement.    Past Medical History:  Diagnosis Date  . Chronic kidney disease   . Diabetes mellitus   . Hypertension   . Stroke [facility])    Past Surgical History:  Procedure Laterality Date  . ABDOMINAL HYSTERECTOMY    . CHOLECYSTECTOMY    . HAMMER TOE SURGERY    . KNEE ARTHROSCOPY    . LUMBAR DISC SURGERY  11/07/11  . LUMBAR WOUND DEBRIDEMENT  11/27/2011   Procedure: LUMBAR WOUND DEBRIDEMENT;  Surgeon: Eustace Moore;  Location: [place] [facility];  Service: Neurosurgery;  Laterality: N/A;  . TONSILLECTOMY     Social History  Substance Use Topics  . Smoking status: Never Smoker  . Smokeless tobacco: Never Used  . Alcohol use No   Family History  Problem Relation Age of Onset  . CAD Mother   . Hypertension Mother   . COPD Father   . Diabetes Sister   . Hypertension Sister     Allergies: No Known Allergies  Current antibiotics: Antibiotics Given (last 72 hours)    Date/Time Action Medication Dose Rate   08/16/16 2248 Given   piperacillin-tazobactam (ZOSYN) IVPB 3.375 g 3.375 g 12.5 mL/hr   08/17/16 0103 Given   vancomycin (VANCOCIN) IVPB 750 mg/150 ml premix 750 mg 150 mL/hr   08/17/16 1236 Given   vancomycin (VANCOCIN) IVPB 750 mg/150 ml premix 750 mg 150 mL/hr   08/17/16 1330 Given   piperacillin-tazobactam (ZOSYN) IVPB 3.375 g 3.375 g 12.5 mL/hr   08/17/16 2122 Given   piperacillin-tazobactam (ZOSYN) IVPB 3.375 g 3.375 g 12.5 mL/hr   08/18/16 0113 Given    vancomycin (VANCOCIN) IVPB 750 mg/150 ml premix 750 mg 150 mL/hr   08/18/16 0653 Given   piperacillin-tazobactam (ZOSYN) IVPB 3.375 g 3.375 g 12.5 mL/hr   08/18/16 1216 Given   vancomycin (VANCOCIN) IVPB 750 mg/150 ml premix 750 mg 150 mL/hr   08/18/16 1455 Given   piperacillin-tazobactam (ZOSYN) IVPB 3.375 g 3.375 g 12.5 mL/hr   08/18/16 2147 Given   piperacillin-tazobactam (ZOSYN) IVPB 3.375 g 3.375 g 12.5 mL/hr   08/19/16 0112 Given   vancomycin (VANCOCIN) IVPB 750 mg/150 ml premix 750 mg 150 mL/hr   08/19/16 0553 Given   piperacillin-tazobactam (ZOSYN) IVPB 3.375 g 3.375 g 12.5 mL/hr   08/19/16 1238 Given   amoxicillin-clavulanate (AUGMENTIN) 875-125 MG per tablet 1 tablet 1 tablet       MEDICATIONS: . amoxicillin-clavulanate  1 tablet Oral Q12H  . aspirin EC  81 mg Oral Daily  . atorvastatin  80 mg Oral q1800  . [START ON 08/20/2016] cloNIDine  0.2 mg Transdermal Weekly  . clopidogrel  75 mg Oral Daily  . enoxaparin (LOVENOX) injection  40 mg Subcutaneous BID  . furosemide  20 mg Intravenous Q12H  . hydrALAZINE  25 mg Oral Q8H  . insulin aspart  0-5 Units Subcutaneous QHS  . insulin aspart  0-9 Units Subcutaneous TID WC  . insulin aspart protamine-  aspart  25 Units Subcutaneous BID WC  . lisinopril  40 mg Oral Daily  . metoprolol succinate  100 mg Oral Daily  . nystatin   Topical BID  . pantoprazole  40 mg Oral QAC breakfast  . potassium chloride SA  20 mEq Oral Daily    Review of Systems - 11 systems reviewed and negative per HPI   OBJECTIVE: Temp:  [98 F (36.7 C)-98.1 F (36.7 C)] 98 F (36.7 C) (08/21 0543) Pulse Rate:  [57-62] 61 (08/21 0546) Resp:  [20] 20 (08/21 0543) BP: (158-184)/(48-64) 178/64 (08/21 0913) SpO2:  [96 %-98 %] 96 % (08/21 0546) Physical Exam  Constitutional:  oriented to person, place, and time. obese HENT: [place]/AT, PERRLA, no scleral icterus Mouth/Throat: Oropharynx is clear and moist. No oropharyngeal exudate.  Cardiovascular:  Normal rate, regular rhythm and normal heart sounds.  Pulmonary/Chest: Effort normal and breath sounds normal. No respiratory distress.  has no wheezes.  Neck = supple, no nuchal rigidity Abdominal: Soft. Bowel sounds are normal.  exhibits no distension. There is no tenderness.  Lymphadenopathy: no cervical adenopathy. No axillary adenopathy Neurological: alert and oriented to person, place, and time.  Skin: bil LE wrapped Psychiatric: a normal mood and affect.  behavior is normal.    LABS: Results for orders placed or performed during the hospital encounter of 08/16/16 (from the past 48 hour(s))  Glucose, capillary     Status: Abnormal   Collection Time: 08/17/16  4:49 PM  Result Value Ref Range   Glucose-Capillary 164 (H) 65 - 99 mg/dL  Glucose, capillary     Status: Abnormal   Collection Time: 08/17/16  9:12 PM  Result Value Ref Range   Glucose-Capillary 175 (H) 65 - 99 mg/dL   Comment 1 Notify RN   Glucose, capillary     Status: Abnormal   Collection Time: 08/18/16  7:58 AM  Result Value Ref Range   Glucose-Capillary 151 (H) 65 - 99 mg/dL  Glucose, capillary     Status: Abnormal   Collection Time: 08/18/16 11:22 AM  Result Value Ref Range   Glucose-Capillary 162 (H) 65 - 99 mg/dL  Vancomycin, trough     Status: Abnormal   Collection Time: 08/18/16 12:42 PM  Result Value Ref Range   Vancomycin Tr 34 (HH) 15 - 20 ug/mL    Comment: CRITICAL RESULT CALLED TO, READ BACK BY AND VERIFIED WITH HOLLY GILLIAM ON 08/18/16 AT 1302 [facility] CORRECTED ON 08/20 AT 2306: PREVIOUSLY REPORTED AS 34 CRITICAL RESULT CALLED TO, READ BACK BY AND VERIFIED WITH HOLLY GILLIAM ON 820/17 AT 1302 [place]   Glucose, capillary     Status: Abnormal   Collection Time: 08/18/16  4:38 PM  Result Value Ref Range   Glucose-Capillary 163 (H) 65 - 99 mg/dL  Glucose, capillary     Status: Abnormal   Collection Time: 08/18/16  9:26 PM  Result Value Ref Range   Glucose-Capillary 167 (H) 65 - 99 mg/dL  Vancomycin,  trough     Status: Abnormal   Collection Time: 08/19/16 12:51 AM  Result Value Ref Range   Vancomycin Tr 21 (HH) 15 - 20 ug/mL    Comment: CRITICAL RESULT CALLED TO, READ BACK BY AND VERIFIED WITH JASON ROBBINS AT 0126 08/19/16.PMH  Glucose, capillary     Status: Abnormal   Collection Time: 08/19/16  7:48 AM  Result Value Ref Range   Glucose-Capillary 124 (H) 65 - 99 mg/dL  Glucose, capillary     Status: Abnormal  Collection Time: 08/19/16 11:58 AM  Result Value Ref Range   Glucose-Capillary 161 (H) 65 - 99 mg/dL   Comment 1 Notify RN    Comment 2 Document in Chart    No components found for: ESR, C REACTIVE PROTEIN MICRO: Recent Results (from the past 720 hour(s))  Blood culture (routine x 2)     Status: None (Preliminary result)   Collection Time: 08/16/16  6:26 PM  Result Value Ref Range Status   Specimen Description BLOOD RIGHT ANTECUBITAL  Final   Special Requests BOTTLES DRAWN AEROBIC AND ANAEROBIC  12CC  Final   Culture NO GROWTH 3 DAYS  Final   Report Status PENDING  Incomplete  Blood culture (routine x 2)     Status: None (Preliminary result)   Collection Time: 08/16/16  6:26 PM  Result Value Ref Range Status   Specimen Description BLOOD LEFT ANTECUBITAL  Final   Special Requests BOTTLES DRAWN AEROBIC AND ANAEROBIC  6CC  Final   Culture NO GROWTH 3 DAYS  Final   Report Status PENDING  Incomplete  Aerobic/Anaerobic Culture (surgical/deep wound)     Status: None (Preliminary result)   Collection Time: 08/16/16  6:26 PM  Result Value Ref Range Status   Specimen Description LEG RIGHT  Final   Special Requests NONE  Final   Gram Stain   Final    RARE WBC PRESENT, PREDOMINANTLY PMN NO ORGANISMS SEEN Performed at [facility]    Culture   Final    MODERATE PSEUDOMONAS AERUGINOSA NO ANAEROBES ISOLATED; CULTURE IN PROGRESS FOR 5 DAYS    Report Status PENDING  Incomplete   Organism ID, Bacteria PSEUDOMONAS AERUGINOSA  Final      Susceptibility   Pseudomonas  aeruginosa - MIC*    CEFTAZIDIME 4 SENSITIVE Sensitive     CIPROFLOXACIN >=4 RESISTANT Resistant     GENTAMICIN 2 SENSITIVE Sensitive     IMIPENEM 2 SENSITIVE Sensitive     PIP/TAZO 8 SENSITIVE Sensitive     CEFEPIME 16 INTERMEDIATE Intermediate     * MODERATE PSEUDOMONAS AERUGINOSA    IMAGING: No results found.  Assessment:   Brooke Hopkins is a 65 y.o. female with LE wounds with pseudomonas resistant to cipro on culture done 8/18.  Per her report much better over last 3 days with zosyn  Recommendations Place picc Ceftazidime 1 gm IV q 12 hours for 21 days Dc vanco and zosyn Fu wound care and with me   Thank you very much for allowing me to participate in the care of this patient. Please call with questions.   Cheral Marker. Ola Spurr, MD

## 2016-08-19 NOTE — Progress Notes (Signed)
Pharmacy Antibiotic Note  Brooke Hopkins is a 65 y.o. female admitted on 08/16/2016 with wound infection.  Pharmacy has been consulted for ceftazidime dosing.  Plan: Ceftazidime 1 g IV q8h.   Height: 5\' 7"  (170.2 cm) Weight: 297 lb (134.7 kg) IBW/kg (Calculated) : 61.6  Temp (24hrs), Avg:98.1 F (36.7 C), Min:98 F (36.7 C), Max:98.2 F (36.8 C)   Recent Labs Lab 08/16/16 1625 08/17/16 0604 08/18/16 1242 08/19/16 0051  WBC 11.2* 9.0  --   --   CREATININE 1.22* 1.17*  --   --   LATICACIDVEN 1.3  --   --   --   VANCOTROUGH  --   --  34* 21*    Estimated Creatinine Clearance: 67.8 mL/min (by C-G formula based on SCr of 1.17 mg/dL).    No Known Allergies  Thank you for allowing pharmacy to be a part of this patient's care.  Rocky Morel 08/19/2016 3:37 PM

## 2016-08-19 NOTE — Care Management (Signed)
Informed by attending that patient's cultures are resistant to Cipro.  Will be assessed by ID.  There is a chance that patient will require IV or IM medication.  Of note- home health pharmacies do not provide medications for IM injections.  This would have to be arranged with a local drug store.  patient does not have a PICC.  Will know more after ID asseses

## 2016-08-19 NOTE — Discharge Summary (Signed)
[place] [facility] [place] NAME: Brooke Hopkins    MR#:  [id number]  DATE OF BIRTH:  08-Apr-1951  DATE OF ADMISSION:  08/16/2016 ADMITTING PHYSICIAN: Demetrios Loll, MD  DATE OF DISCHARGE: 08/19/16  PRIMARY CARE PHYSICIAN: Leonel Ramsay, MD    ADMISSION DIAGNOSIS:  Bilateral cellulitis of lower leg [L03.116, L03.115] Lymphedema of both lower extremities [I89.0]  DISCHARGE DIAGNOSIS:  Right LE cellulitis with non healing ulcers-Pseudomonas + DM-2 Chronic Lymphedema HTN Morbid obeisty  SECONDARY DIAGNOSIS:   Past Medical History:  Diagnosis Date  . Chronic kidney disease   . Diabetes mellitus   . Hypertension   . Stroke [facility])     HOSPITAL COURSE:   LindaBlackwoodis a 65 y.o.femalewith a known history of Hypertension, diabetes, CVA and the CKD. She has chronic bilateral lower leg wounds and chronic bilateral lower extremity lymphedema, which has been worsening for the past 3 days in spite of outpatient antibiotics on Cipro. She was sent from wound care clinic to the ED for further treatment with IV antibiotics  *Bilateral leg wound infection and cellulitis, acute on chronic -on vancomycin and Zosyn -follow-up  Dressing changes perwound care consult  -cont lasix at home. recieved IV lasix and showed improvement in LE edema -pt's inactivity is not helping with her lymphedema -WC  Resistant Pseudomonas -Dr fitzgerald to see pt-recommends  PICC line and Ceftazidime 1 g bid till 09/09/16  *Hypertension. Continue clonidine, hydralazine, lisinopril and BB  * Diabetes. Continue NovoLog Mix 70/30 and start sliding scale.  *CKD 3, stable. -monitor creat with pt being on lasix -avoid nephrotoxins  Will d/chome later today after PICC line CONSULTS OBTAINED:  Treatment Team:  Leonel Ramsay, MD  DRUG ALLERGIES:  No Known Allergies  DISCHARGE MEDICATIONS:   Current Discharge Medication List    START taking these  medications   Details  cefTAZidime 1 g in dextrose 5 % 50 mL Inject 1 g into the vein every 8 (eight) hours. Qty: 42 ampule, Refills: 0    traMADol (ULTRAM) 50 MG tablet Take 1 tablet (50 mg total) by mouth every 6 (six) hours as needed for moderate pain. Qty: 30 tablet, Refills: 0      CONTINUE these medications which have CHANGED   Details  insulin NPH-regular Human (NOVOLIN 70/30) (70-30) 100 UNIT/ML injection Inject 35 Units into the skin 2 (two) times daily. Qty: 10 mL, Refills: 11      CONTINUE these medications which have NOT CHANGED   Details  aspirin EC 81 MG tablet Take 81 mg by mouth daily.    atorvastatin (LIPITOR) 80 MG tablet Take 1 tablet (80 mg total) by mouth daily at 6 PM. Qty: 30 tablet, Refills: 0    clopidogrel (PLAVIX) 75 MG tablet Take 75 mg by mouth daily.    furosemide (LASIX) 40 MG tablet Take 40 mg by mouth daily.     hydrALAZINE (APRESOLINE) 25 MG tablet Take 1 tablet (25 mg total) by mouth every 8 (eight) hours. Qty: 90 tablet, Refills: 0    lisinopril (PRINIVIL,ZESTRIL) 40 MG tablet Take 40 mg by mouth daily.    metFORMIN (GLUCOPHAGE-XR) 500 MG 24 hr tablet Take 1,000 mg by mouth daily with supper.     metoprolol succinate (TOPROL-XL) 100 MG 24 hr tablet Take 100 mg by mouth daily.    pantoprazole (PROTONIX) 40 MG tablet Take 40 mg by mouth daily.    potassium chloride SA (K-DUR,KLOR-CON) 20 MEQ tablet Take 20  mEq by mouth daily.    cloNIDine (CATAPRES - DOSED IN MG/24 HR) 0.2 mg/24hr patch Place 1 patch (0.2 mg total) onto the skin once a week. Qty: 4 patch, Refills: 0    collagenase (SANTYL) ointment Apply topically daily. Qty: 30 g, Refills: 0    nystatin cream (MYCOSTATIN) Apply topically 2 (two) times daily. Qty: 30 g, Refills: 0    oxyCODONE (OXY IR/ROXICODONE) 5 MG immediate release tablet Take 1 tablet (5 mg total) by mouth every 4 (four) hours as needed for moderate pain. Qty: 30 tablet, Refills: 0    promethazine (PHENERGAN)  25 MG tablet Take 25 mg by mouth every 8 (eight) hours as needed for nausea or vomiting.    triamcinolone ointment (KENALOG) 0.5 % Apply 1 application topically 2 (two) times daily.      STOP taking these medications     ciprofloxacin (CIPRO) 750 MG tablet      insulin aspart protamine- aspart (NOVOLOG MIX 70/30) (70-30) 100 UNIT/ML injection         If you experience worsening of your admission symptoms, develop shortness of breath, life threatening emergency, suicidal or homicidal thoughts you must seek medical attention immediately by calling 911 or calling your MD immediately  if symptoms less severe.  You Must read complete instructions/literature along with all the possible adverse reactions/side effects for all the Medicines you take and that have been prescribed to you. Take any new Medicines after you have completely understood and accept all the possible adverse reactions/side effects.   Please note  You were cared for by a hospitalist during your hospital stay. If you have any questions about your discharge medications or the care you received while you were in the hospital after you are discharged, you can call the unit and asked to speak with the hospitalist on call if the hospitalist that took care of you is not available. Once you are discharged, your primary care physician will handle any further medical issues. Please note that NO REFILLS for any discharge medications will be authorized once you are discharged, as it is imperative that you return to your primary care physician (or establish a relationship with a primary care physician if you do not have one) for your aftercare needs so that they can reassess your need for medications and monitor your lab values. Today   SUBJECTIVE   Overall improving  VITAL SIGNS:  Blood pressure (!) 184/51, pulse 66, temperature 98.2 F (36.8 C), temperature source Oral, resp. rate 20, height 5\' 7"  (1.702 m), weight 297 lb (134.7 kg),  SpO2 98 %.  I/O:   Intake/Output Summary (Last 24 hours) at 08/19/16 1452 Last data filed at 08/19/16 1300  Gross per 24 hour  Intake              395 ml  Output                0 ml  Net              395 ml    PHYSICAL EXAMINATION:  GENERAL:  65 y.o.-year-old patient lying in the bed with no acute distress.  EYES: Pupils equal, round, reactive to light and accommodation. No scleral icterus. Extraocular muscles intact.  HEENT: Head atraumatic, normocephalic. Oropharynx and nasopharynx clear.  NECK:  Supple, no jugular venous distention. No thyroid enlargement, no tenderness.  LUNGS: Normal breath sounds bilaterally, no wheezing, rales,rhonchi or crepitation. No use of accessory muscles of respiration.  CARDIOVASCULAR: S1, S2  normal. No murmurs, rubs, or gallops.  ABDOMEN: Soft, non-tender, non-distended. Bowel sounds present. No organomegaly or mass.  EXTREMITIES: +++pedal edema,no  cyanosis, or clubbing.  Lymphedema+ and left LE cellulitis and non healing ulcer NEUROLOGIC: Cranial nerves II through XII are intact. Muscle strength 5/5 in all extremities. Sensation intact. Gait not checked.  PSYCHIATRIC: The patient is alert and oriented x 3.  SKIN: No obvious rash, lesion, or ulcer.   DATA REVIEW:   CBC   Recent Labs Lab 08/17/16 0604  WBC 9.0  HGB 9.1*  HCT 27.5*  PLT 241    Chemistries   Recent Labs Lab 08/17/16 0604  NA 138  K 3.9  CL 106  CO2 26  GLUCOSE 175*  BUN 31*  CREATININE 1.17*  CALCIUM 8.1*    Microbiology Results   Recent Results (from the past 240 hour(s))  Blood culture (routine x 2)     Status: None (Preliminary result)   Collection Time: 08/16/16  6:26 PM  Result Value Ref Range Status   Specimen Description BLOOD RIGHT ANTECUBITAL  Final   Special Requests BOTTLES DRAWN AEROBIC AND ANAEROBIC  12CC  Final   Culture NO GROWTH 3 DAYS  Final   Report Status PENDING  Incomplete  Blood culture (routine x 2)     Status: None (Preliminary  result)   Collection Time: 08/16/16  6:26 PM  Result Value Ref Range Status   Specimen Description BLOOD LEFT ANTECUBITAL  Final   Special Requests BOTTLES DRAWN AEROBIC AND ANAEROBIC  6CC  Final   Culture NO GROWTH 3 DAYS  Final   Report Status PENDING  Incomplete  Aerobic/Anaerobic Culture (surgical/deep wound)     Status: None (Preliminary result)   Collection Time: 08/16/16  6:26 PM  Result Value Ref Range Status   Specimen Description LEG RIGHT  Final   Special Requests NONE  Final   Gram Stain   Final    RARE WBC PRESENT, PREDOMINANTLY PMN NO ORGANISMS SEEN Performed at [facility]    Culture   Final    MODERATE PSEUDOMONAS AERUGINOSA NO ANAEROBES ISOLATED; CULTURE IN PROGRESS FOR 5 DAYS    Report Status PENDING  Incomplete   Organism ID, Bacteria PSEUDOMONAS AERUGINOSA  Final      Susceptibility   Pseudomonas aeruginosa - MIC*    CEFTAZIDIME 4 SENSITIVE Sensitive     CIPROFLOXACIN >=4 RESISTANT Resistant     GENTAMICIN 2 SENSITIVE Sensitive     IMIPENEM 2 SENSITIVE Sensitive     PIP/TAZO 8 SENSITIVE Sensitive     CEFEPIME 16 INTERMEDIATE Intermediate     * MODERATE PSEUDOMONAS AERUGINOSA    RADIOLOGY:  No results found.   Management plans discussed with the patient, family and they are in agreement.  CODE STATUS:     Code Status Orders        Start     Ordered   08/16/16 2030  Full code  Continuous     08/16/16 2029    Code Status History    Date Active Date Inactive Code Status Order ID Comments User Context   10/13/2015 10:10 PM 10/17/2015  6:31 PM Full Code [id number]  Aldean Jewett, MD Inpatient      TOTAL TIME TAKING CARE OF THIS PATIENT: 40 minutes.    Marlyce Mcdougald M.D on 08/19/2016 at 2:52 PM  Between 7am to 6pm - Pager - [phone] After 6pm go to [email or web address] - [facility]  Office  [phone]  CC: Primary care physician; Leonel Ramsay, MD

## 2016-08-19 NOTE — Evaluation (Signed)
Physical Therapy Evaluation Patient Details Name: Brooke Hopkins MRN: [id number] DOB: [birth year] Today's Date: 08/19/2016   History of Present Illness  Patient is a 65 y/o female that presents with bilateral LE wound infections and cellulitis. 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   Clinical Impression  Patient is a 65 y/o female that has largely been w/c bound for the past year or so after a CVA. She reports residual L sided numbness and weakness. She has primarily been using a stirrup at home to bring her LEs off the edge of the bed and transfers from bed to w/c and BSC. She appears to have regressed somewhat from her baseline requiring min A to complete transfer from bed to w/c and w/c to recliner chair. However, husband and patient report she will have 24 hour supervision and assistance. Patient and husband report she is roughly at her baseline mobility level today, though likely weaker due to inactivity. Would recommend 24 hour assistance and HHPT to increase her ability to complete transfers.     Follow Up Recommendations Home health PT;Supervision/Assistance - 24 hour    Equipment Recommendations       Recommendations for Other Services       Precautions / Restrictions Precautions Precautions: Fall Restrictions Weight Bearing Restrictions: No      Mobility  Bed Mobility Overal bed mobility: Needs Assistance Bed Mobility: Supine to Sit     Supine to sit: Min assist;Mod assist     General bed mobility comments: Patient requires assistance to bring LEs to the EOB, able to push trunk upright via UEs.   Transfers Overall transfer level: Needs assistance Equipment used: 1 person hand held assist Transfers: Stand Pivot Transfers   Stand pivot transfers: +2 physical assistance;Min assist       General transfer comment: Patient rocks forward and is able to stand with min A x2, she is able to pivot via using her UEs as contact  point on either bed or RW as she performs at home. Per patient and husband this transfer is roughly at her baseline today.   Ambulation/Gait             General Gait Details: She takes pivoting steps, not true gait, UEs in contact with chair to maintain balance.   Stairs            Wheelchair Mobility    Modified Rankin (Stroke Patients Only)       Balance Overall balance assessment: Needs assistance Sitting-balance support: Feet supported;No upper extremity supported Sitting balance-Leahy Scale: Good     Standing balance support: Bilateral upper extremity supported Standing balance-Leahy Scale: Poor                               Pertinent Vitals/Pain Pain Assessment:  (Reports she still has pain in her LEs though this has largely reduced from several days ago.)    [place] expects to be discharged to:: Private residence Living Arrangements: Spouse/significant other Available Help at Discharge: Family;Available 24 hours/day Type of Home: House Home Access: Ramped entrance       Home Equipment:  (Lift chair from recliner, stirrups for transferring LEs out of bed, RW, w/c)      Prior Function Level of Independence: Independent with assistive device(s)         Comments: Patient does not really ambulate, she completes sit to stand and  pivot transfer from bed to w/c to recliner and BSC at home.      Hand Dominance        Extremity/Trunk Assessment   Upper Extremity Assessment: Generalized weakness           Lower Extremity Assessment: Generalized weakness         Communication   Communication: No difficulties  Cognition Arousal/Alertness: Awake/alert Behavior During Therapy: WFL for tasks assessed/performed Overall Cognitive Status: Within Functional Limits for tasks assessed                      General Comments General comments (skin integrity, edema, etc.): LE wrappings in place on distal LEs.      Exercises Other Exercises Other Exercises: Transferred patient to w/c, transferred patient to and from w/c to commode with min-mod A and use of hand rails with cuing for safety provided.       Assessment/Plan    PT Assessment Patient needs continued PT services  PT Diagnosis Difficulty walking;Generalized weakness   PT Problem List Decreased strength;Decreased mobility;Decreased skin integrity;Pain;Decreased balance;Decreased knowledge of use of DME;Decreased activity tolerance  PT Treatment Interventions DME instruction;Gait training;Balance training;Therapeutic exercise;Therapeutic activities;Wheelchair mobility training;Functional mobility training;Patient/family education   PT Goals (Current goals can be found in the Care Plan section) Acute Rehab PT Goals Patient Stated Goal: To return home  PT Goal Formulation: With patient/family Time For Goal Achievement: 09/02/16 Potential to Achieve Goals: Fair    Frequency Min 2X/week   Barriers to discharge        Co-evaluation               End of Session Equipment Utilized During Treatment: Gait belt Activity Tolerance: Patient tolerated treatment well Patient left: in chair;with chair alarm set;with call bell/phone within reach;with family/visitor present;with nursing/sitter in room Nurse Communication: Mobility status         Time: 1623-1700 PT Time Calculation (min) (ACUTE ONLY): 37 min   Charges:   PT Evaluation $PT Eval Moderate Complexity: 1 Procedure PT Treatments $Therapeutic Activity: 8-22 mins (Assisted patient to commode)   PT G Codes:       Kerman Passey, PT, DPT    08/19/2016, 5:03 PM

## 2016-08-19 NOTE — Progress Notes (Signed)
[facility] called for PICC line placement.  Noel Gerold., RN 08/19/16  [id number]

## 2016-08-19 NOTE — Progress Notes (Signed)
Pharmacy Antibiotic Note  Brooke Hopkins is a 65 y.o. female admitted on 08/16/2016 with cellulitis.  Pharmacy has been consulted for vancomycin dosing.  Plan: Vancomycin 750 mg IV q12h (stacked dose 6 hours after 1000 mg initial dose) Vancomycin goal trough 10-15 mcg/mL Vancomycin trough ordered 8/20 @ 1230  Kinetics: Based on adjusted body weight of 90 kg Ke: 0.058 Half-life: 11.8 hrs Vd: 63 L Patient at risk for accumulation  8/20:  Vanc trough @ 13:00 = 34 mcg/mL 8/21:  Vanc trough @ 0100 = 21 mcg/mL   Will adjust dose to Vancomycin 750 mg IV Q18H to start on 8/21 @ 19:00. Will draw next trough before 3rd new dose on 8/23 @ 0630.   Height: 5\' 7"  (170.2 cm) Weight: 297 lb (134.7 kg) IBW/kg (Calculated) : 61.6  Temp (24hrs), Avg:98.8 F (37.1 C), Min:98.1 F (36.7 C), Max:99.5 F (37.5 C)   Recent Labs Lab 08/16/16 1625 08/17/16 0604 08/18/16 1242 08/19/16 0051  WBC 11.2* 9.0  --   --   CREATININE 1.22* 1.17*  --   --   LATICACIDVEN 1.3  --   --   --   VANCOTROUGH  --   --  34* 21*    Estimated Creatinine Clearance: 67.8 mL/min (by C-G formula based on SCr of 1.17 mg/dL).    No Known Allergies   Thank you for allowing pharmacy to be a part of this patient's care.  Dontrail Blackwell D 08/19/2016 1:37 AM

## 2016-08-19 NOTE — Progress Notes (Signed)
IV abx given through PICC line. D/C instructions, prescriptions, medications, and follow up appointments were given to pt and husband at bedside.

## 2016-08-19 NOTE — Consult Note (Signed)
[facility] Nurse wound consult note Reason for Consult: Cellulitis, open ulcers to BLE Wound type: Cellulitis complicated by lymphedema to BLE.  Pt is wheelchair bound. Pressure Ulcer NA Measurement: 10 x 10  Open ulcers to right and left  posterior calf  Wound bed: 50% pink, smooth, tissue with  50% yellow slough Drainage (amount, consistency, odor) copious serosanguinous drainage, thin, no odor Periwound: macerated, wrinkled skin that is ruddy colored, small fluid filled blisters scattered around ankle Dressing procedure/placement/frequency: Pt normally goes to wound care clinic for profore 4 layer wrap on Fridays.  She has a home care nurse that does the wrap on Monday and Wednesday.   Today washed legs with soap and water.  Applied (2) alginate to back of legs with (2) ABD.  Wrapped with kerlix then secured with coban 50% stretch.  Recommend pt returns to Profore dressing with wound care/ home care.  Pt will need to elevate legs when sitting and while laying.  Pt has lymphedema compression boots at home that she is to use twice a day.  Encouraged pt to use compression boots as the compression dressings have limited effect on non-ambulating patients.  Discussed POC with patient, physician, and bedside nurse.  Anticipate discharge today. Thanks Dorna Bloom BSN, RN, [facility]

## 2016-08-19 NOTE — Care Management (Signed)
ID has assessed and ordered IV fortaz q 12h.  Patient is in agreement. Anticipate discharge today if able to have PICC inserted.  Patient will receive first dose prior to discharge.  [facility] informed and will see patient between 8-9A 8/22 to begin instruction on administration of IV meds. [facility] will provide the medication.  Depending on time that patient is able to discharge today, the medication will either be delivered to patient's hospital room or to her home.  Faxed referral information to [facility].

## 2016-08-20 NOTE — Care Management (Signed)
ID information faxed to Dr Blane Ohara office

## 2016-08-21 LAB — CULTURE, BLOOD (ROUTINE X 2)
Culture: NO GROWTH
Culture: NO GROWTH

## 2016-08-21 LAB — AEROBIC/ANAEROBIC CULTURE W GRAM STAIN (SURGICAL/DEEP WOUND)

## 2016-08-21 LAB — AEROBIC/ANAEROBIC CULTURE (SURGICAL/DEEP WOUND)

## 2016-08-23 ENCOUNTER — Encounter: Payer: 59 | Admitting: Surgery

## 2016-08-23 DIAGNOSIS — E11622 Type 2 diabetes mellitus with other skin ulcer: Secondary | ICD-10-CM | POA: Diagnosis not present

## 2016-08-23 DIAGNOSIS — Z6841 Body Mass Index (BMI) 40.0 and over, adult: Secondary | ICD-10-CM | POA: Diagnosis not present

## 2016-08-23 DIAGNOSIS — Z87891 Personal history of nicotine dependence: Secondary | ICD-10-CM | POA: Diagnosis not present

## 2016-08-23 DIAGNOSIS — L97212 Non-pressure chronic ulcer of right calf with fat layer exposed: Secondary | ICD-10-CM | POA: Diagnosis not present

## 2016-08-23 DIAGNOSIS — B372 Candidiasis of skin and nail: Secondary | ICD-10-CM | POA: Diagnosis not present

## 2016-08-23 DIAGNOSIS — L97811 Non-pressure chronic ulcer of other part of right lower leg limited to breakdown of skin: Secondary | ICD-10-CM | POA: Diagnosis not present

## 2016-08-23 DIAGNOSIS — L97821 Non-pressure chronic ulcer of other part of left lower leg limited to breakdown of skin: Secondary | ICD-10-CM | POA: Diagnosis not present

## 2016-08-23 DIAGNOSIS — L97222 Non-pressure chronic ulcer of left calf with fat layer exposed: Secondary | ICD-10-CM | POA: Diagnosis not present

## 2016-08-23 DIAGNOSIS — I890 Lymphedema, not elsewhere classified: Secondary | ICD-10-CM | POA: Diagnosis not present

## 2016-08-23 DIAGNOSIS — E1140 Type 2 diabetes mellitus with diabetic neuropathy, unspecified: Secondary | ICD-10-CM | POA: Diagnosis not present

## 2016-08-24 NOTE — Progress Notes (Signed)
Brooke Hopkins, Brooke Hopkins ([id number]) Visit Report for 08/23/2016 Arrival Information Details Patient Name: Brooke Hopkins, Brooke Hopkins. Date of Service: 08/23/2016 8:00 AM Medical Record Number: [id number] Patient Account Number: [id number] Date of Birth/Sex: 03/15/1951 (64 y.o. Female) Treating RN: Montey Hora Primary Care Physician: FITZGERALD, DAVID Other Clinician: Referring Physician: FITZGERALD, DAVID Treating Physician/Extender: Frann Rider in Treatment: 9 Visit Information History Since Last Visit Added or deleted any medications: No Patient Arrived: Wheel Chair Any new allergies or adverse reactions: No Arrival Time: 08:07 Had a fall or experienced change in No activities of daily living that may affect Accompanied By: son risk of falls: Transfer Assistance: None Signs or symptoms of abuse/neglect since last No Patient Identification Verified: Yes visito Secondary Verification Process Yes Hospitalized since last visit: No Completed: Pain Present Now: No Patient Requires Transmission-Based No Precautions: Patient Has Alerts: No Electronic Signature(s) Signed: 08/23/2016 4:42:59 PM By: Montey Hora Entered By: Montey Hora on 08/23/2016 08:10:07 [name] ([id number]) -------------------------------------------------------------------------------- Encounter Discharge Information Details Patient Name: Brooke Hopkins, Brooke Hopkins. Date of Service: 08/23/2016 8:00 AM Medical Record Number: [id number] Patient Account Number: [id number] Date of Birth/Sex: 1951-11-18 (64 y.o. Female) Treating RN: Montey Hora Primary Care Physician: FITZGERALD, DAVID Other Clinician: Referring Physician: FITZGERALD, DAVID Treating Physician/Extender: Frann Rider in Treatment: 9 Encounter Discharge Information Items Discharge Pain Level: 0 Discharge Condition: Stable Ambulatory Status: Wheelchair Discharge Destination: Home Transportation: Private Auto Accompanied By:  son Schedule Follow-up Appointment: Yes Medication Reconciliation completed and provided to Patient/Care No Seddrick Flax: Provided on Clinical Summary of Care: 08/23/2016 Form Type Recipient Paper Patient LB Electronic Signature(s) Signed: 08/23/2016 4:42:59 PM By: Montey Hora Previous Signature: 08/23/2016 9:11:43 AM Version By: Ruthine Dose Entered By: Montey Hora on 08/23/2016 09:41:15 [name] ([id number]) -------------------------------------------------------------------------------- Lower Extremity Assessment Details Patient Name: Brooke Hopkins, Brooke Hopkins. Date of Service: 08/23/2016 8:00 AM Medical Record Number: [id number] Patient Account Number: [id number] Date of Birth/Sex: [birth year] (64 y.o. Female) Treating RN: Montey Hora Primary Care Physician: FITZGERALD, DAVID Other Clinician: Referring Physician: FITZGERALD, DAVID Treating Physician/Extender: Frann Rider in Treatment: 9 Edema Assessment Assessed: [Left: No] [Right: No] Edema: [Left: Yes] [Right: Yes] Calf Left: Right: Point of Measurement: 32 cm From Medial Instep cm cm Ankle Left: Right: Point of Measurement: 11 cm From Medial Instep cm cm Vascular Assessment Pulses: Posterior Tibial Dorsalis Pedis Palpable: [Left:Yes] [Right:Yes] Extremity colors, hair growth, and conditions: Extremity Color: [Left:Red] [Right:Red] Hair Growth on Extremity: [Left:No] [Right:No] Temperature of Extremity: [Left:Warm] [Right:Warm] Capillary Refill: [Left:< 3 seconds] [Right:< 3 seconds] Electronic Signature(s) Signed: 08/23/2016 4:42:59 PM By: Montey Hora Entered By: Montey Hora on 08/23/2016 08:42:52 Aber, [name] ([id number]) -------------------------------------------------------------------------------- Multi Wound Chart Details Patient Name: Brooke Hopkins, Brooke Hopkins. Date of Service: 08/23/2016 8:00 AM Medical Record Number: [id number] Patient Account Number: [id number] Date of Birth/Sex:  1951/12/11 (64 y.o. Female) Treating RN: Montey Hora Primary Care Physician: FITZGERALD, DAVID Other Clinician: Referring Physician: FITZGERALD, DAVID Treating Physician/Extender: Frann Rider in Treatment: 9 Vital Signs Height(in): 67 Pulse(bpm): 68 Weight(lbs): 300 Blood Pressure 193/53 (mmHg): Body Mass Index(BMI): 47 Temperature(F): 98.1 Respiratory Rate 20 (breaths/min): Photos: [1:No Photos] [2:No Photos] [N/A:N/A] Wound Location: [1:Left Lower Leg - Circumfernential] [2:Right Lower Leg - Circumfernential] [N/A:N/A] Wounding Event: [1:Gradually Appeared] [2:Gradually Appeared] [N/A:N/A] Primary Etiology: [1:Lymphedema] [2:Lymphedema] [N/A:N/A] Date Acquired: [1:12/31/2015] [2:12/31/2015] [N/A:N/A] Weeks of Treatment: [1:9] [2:9] [N/A:N/A] Wound Status: [1:Open] [2:Open] [N/A:N/A] Clustered Wound: [1:No] [2:Yes] [N/A:N/A] Clustered Quantity: [1:N/A] [2:3] [N/A:N/A] Measurements L x W x D 6x12x0.2 [2:5x9.5x0.2] [N/A:N/A] (cm) Area (cm) : [1:56.549] [2:37.306] [N/A:N/A]  Volume (cm) : [1:11.31] [2:7.461] [N/A:N/A] % Reduction in Area: [1:43.70%] [2:83.50%] [N/A:N/A] % Reduction in Volume: 43.70% [2:83.50%] [N/A:N/A] Classification: [1:Partial Thickness] [2:Partial Thickness] [N/A:N/A] Exudate Amount: [1:Large] [2:Large] [N/A:N/A] Exudate Type: [1:Purulent] [2:Purulent] [N/A:N/A] Exudate Color: [1:yellow, brown, green] [2:yellow, brown, green] [N/A:N/A] Foul Odor After [1:Yes] [2:Yes] [N/A:N/A] Cleansing: Odor Anticipated Due to No [2:No] [N/A:N/A] Product Use: Wound Margin: [1:Flat and Intact] [2:Indistinct, nonvisible] [N/A:N/A] Granulation Amount: [1:None Present (0%)] [2:None Present (0%)] [N/A:N/A] Necrotic Amount: [1:Large (67-100%)] [2:Large (67-100%)] [N/A:N/A] Exposed Structures: [1:Fascia: No Fat: No Tendon: No] [2:Fascia: No Fat: No Tendon: No] [N/A:N/A] Muscle: No Muscle: No Joint: No Joint: No Bone: No Bone: No Limited to Skin Limited to  Skin Breakdown Breakdown Epithelialization: None Small (1-33%) N/A Periwound Skin Texture: Edema: Yes Edema: Yes N/A Scarring: Yes Scarring: Yes Excoriation: No Excoriation: No Induration: No Induration: No Callus: No Callus: No Crepitus: No Crepitus: No Fluctuance: No Fluctuance: No Friable: No Friable: No Rash: No Rash: No Periwound Skin Maceration: Yes Maceration: Yes N/A Moisture: Moist: Yes Moist: Yes Dry/Scaly: No Dry/Scaly: No Periwound Skin Color: Hemosiderin Staining: Yes Hemosiderin Staining: Yes N/A Atrophie Blanche: No Atrophie Blanche: No Cyanosis: No Cyanosis: No Ecchymosis: No Ecchymosis: No Erythema: No Erythema: No Mottled: No Mottled: No Pallor: No Pallor: No Rubor: No Rubor: No Temperature: No Abnormality No Abnormality N/A Tenderness on Yes Yes N/A Palpation: Wound Preparation: Ulcer Cleansing: Other: Ulcer Cleansing: Other: N/A soap and water soap and water Topical Anesthetic Topical Anesthetic Applied: Other: lidocaine Applied: Other: lidocaine 4% 4% Treatment Notes Electronic Signature(s) Signed: 08/23/2016 4:42:59 PM By: Montey Hora Entered By: Montey Hora on 08/23/2016 08:43:33 [name] ([id number]) -------------------------------------------------------------------------------- Multi-Disciplinary Care Plan Details Patient Name: Brooke Hopkins, Brooke Hopkins. Date of Service: 08/23/2016 8:00 AM Medical Record Number: [id number] Patient Account Number: [id number] Date of Birth/Sex: 1951/01/05 (64 y.o. Female) Treating RN: Montey Hora Primary Care Physician: FITZGERALD, DAVID Other Clinician: Referring Physician: FITZGERALD, DAVID Treating Physician/Extender: Frann Rider in Treatment: 9 Active Inactive Orientation to the Wound Care Program Nursing Diagnoses: Knowledge deficit related to the wound healing center program Goals: Patient/caregiver will verbalize understanding of the [facility]  Program Date Initiated: 06/21/2016 Goal Status: Active Interventions: Provide education on orientation to the wound center Notes: Wound/Skin Impairment Nursing Diagnoses: Impaired tissue integrity Goals: Patient/caregiver will verbalize understanding of skin care regimen Date Initiated: 06/21/2016 Goal Status: Active Ulcer/skin breakdown will have a volume reduction of 30% by week 4 Date Initiated: 06/21/2016 Goal Status: Active Ulcer/skin breakdown will have a volume reduction of 50% by week 8 Date Initiated: 06/21/2016 Goal Status: Active Ulcer/skin breakdown will have a volume reduction of 80% by week 12 Date Initiated: 06/21/2016 Goal Status: Active Ulcer/skin breakdown will heal within 14 weeks Date Initiated: 06/21/2016 Goal Status: Active Brooke Hopkins, Brooke Hopkins ([id number]) Interventions: Assess patient/caregiver ability to obtain necessary supplies Assess patient/caregiver ability to perform ulcer/skin care regimen upon admission and as needed Assess ulceration(s) every visit Provide education on ulcer and skin care Notes: Electronic Signature(s) Signed: 08/23/2016 4:42:59 PM By: Montey Hora Entered By: Montey Hora on 08/23/2016 08:43:22 Scheeler, [name] ([id number]) -------------------------------------------------------------------------------- Pain Assessment Details Patient Name: Brooke Hopkins, Brooke Hopkins. Date of Service: 08/23/2016 8:00 AM Medical Record Number: [id number] Patient Account Number: [id number] Date of Birth/Sex: 30-Jan-1951 (64 y.o. Female) Treating RN: Montey Hora Primary Care Physician: FITZGERALD, DAVID Other Clinician: Referring Physician: FITZGERALD, DAVID Treating Physician/Extender: Frann Rider in Treatment: 9 Active Problems Location of Pain Severity and Description of Pain Patient Has Paino No Site Locations Pain  Management and Medication Current Pain Management: Electronic Signature(s) Signed: 08/23/2016 4:42:59 PM By: Montey Hora Entered By: Montey Hora on 08/23/2016 08:10:18 [name] ([id number]) -------------------------------------------------------------------------------- Patient/Caregiver Education Details Patient Name: Brooke Hopkins, Brooke Hopkins. Date of Service: 08/23/2016 8:00 AM Medical Record Number: [id number] Patient Account Number: [id number] Date of Birth/Gender: May 26, 1951 (64 y.o. Female) Treating RN: Montey Hora Primary Care Physician: FITZGERALD, DAVID Other Clinician: Referring Physician: FITZGERALD, DAVID Treating Physician/Extender: Frann Rider in Treatment: 9 Education Assessment Education Provided To: Patient Education Topics Provided Venous: Handouts: Other: leg elevation Methods: Explain/Verbal Responses: State content correctly Electronic Signature(s) Signed: 08/23/2016 4:42:59 PM By: Montey Hora Entered By: Montey Hora on 08/23/2016 08:44:02 [name] ([id number]) -------------------------------------------------------------------------------- Wound Assessment Details Patient Name: Brooke Hopkins, Brooke Hopkins. Date of Service: 08/23/2016 8:00 AM Medical Record Number: [id number] Patient Account Number: [id number] Date of Birth/Sex: Sep 14, 1951 (64 y.o. Female) Treating RN: Montey Hora Primary Care Physician: FITZGERALD, DAVID Other Clinician: Referring Physician: FITZGERALD, DAVID Treating Physician/Extender: Frann Rider in Treatment: 9 Wound Status Wound Number: 1 Primary Etiology: Lymphedema Wound Location: Left Lower Leg - Wound Status: Open Circumfernential Wounding Event: Gradually Appeared Date Acquired: 12/31/2015 Weeks Of Treatment: 9 Clustered Wound: No Photos Wound Measurements Length: (cm) 6 Width: (cm) 12 Depth: (cm) 0.2 Area: (cm) 56.549 Volume: (cm) 11.31 % Reduction in Area: 43.7% % Reduction in Volume: 43.7% Epithelialization: None Tunneling: No Undermining: No Wound Description Classification: Partial  Thickness Wound Margin: Flat and Intact Exudate Amount: Large Exudate Type: Purulent Exudate Color: yellow, brown, green Foul Odor After Cleansing: Yes Due to Product Use: No Wound Bed Granulation Amount: None Present (0%) Exposed Structure Necrotic Amount: Large (67-100%) Fascia Exposed: No Necrotic Quality: Adherent Slough Fat Layer Exposed: No Tendon Exposed: No Brooke Hopkins, Brooke Hopkins ([id number]) Muscle Exposed: No Joint Exposed: No Bone Exposed: No Limited to Skin Breakdown Periwound Skin Texture Texture Color No Abnormalities Noted: No No Abnormalities Noted: No Callus: No Atrophie Blanche: No Crepitus: No Cyanosis: No Excoriation: No Ecchymosis: No Fluctuance: No Erythema: No Friable: No Hemosiderin Staining: Yes Induration: No Mottled: No Localized Edema: Yes Pallor: No Rash: No Rubor: No Scarring: Yes Temperature / Pain Moisture Temperature: No Abnormality No Abnormalities Noted: No Tenderness on Palpation: Yes Dry / Scaly: No Maceration: Yes Moist: Yes Wound Preparation Ulcer Cleansing: Other: soap and water, Topical Anesthetic Applied: Other: lidocaine 4%, Treatment Notes Wound #1 (Left, Circumferential Lower Leg) 1. Cleansed with: Clean wound with Normal Saline 2. Anesthetic Topical Lidocaine 4% cream to wound bed prior to debridement 4. Dressing Applied: Aquacel Ag 5. Secondary Dressing Applied ABD Pad 7. Secured with 3 Layer Compression System - Bilateral Electronic Signature(s) Signed: 08/23/2016 4:42:59 PM By: Montey Hora Entered By: Montey Hora on 08/23/2016 12:36:27 [name] ([id number]) -------------------------------------------------------------------------------- Wound Assessment Details Patient Name: Brooke Hopkins, BREYER. Date of Service: 08/23/2016 8:00 AM Medical Record Number: [id number] Patient Account Number: [id number] Date of Birth/Sex: [birth year] (64 y.o. Female) Treating RN: Montey Hora Primary Care  Physician: FITZGERALD, DAVID Other Clinician: Referring Physician: FITZGERALD, DAVID Treating Physician/Extender: Frann Rider in Treatment: 9 Wound Status Wound Number: 2 Primary Etiology: Lymphedema Wound Location: Right Lower Leg - Wound Status: Open Circumfernential Wounding Event: Gradually Appeared Date Acquired: 12/31/2015 Weeks Of Treatment: 9 Clustered Wound: Yes Photos Wound Measurements Length: (cm) 5 Width: (cm) 9.5 Depth: (cm) 0.2 Clustered Quantity: 3 Area: (cm) 37.306 Volume: (cm) 7.461 % Reduction in Area: 83.5% % Reduction in Volume: 83.5% Epithelialization: Small (1-33%) Tunneling: No Undermining: No Wound Description Classification: Partial Thickness Wound  Margin: Indistinct, nonvisible Exudate Amount: Large Exudate Type: Purulent Exudate Color: yellow, brown, green Foul Odor After Cleansing: Yes Due to Product Use: No Wound Bed Granulation Amount: None Present (0%) Exposed Structure Necrotic Amount: Large (67-100%) Fascia Exposed: No Necrotic Quality: Adherent Slough Fat Layer Exposed: No NICKOLAS, SHOEMATE ([id number]) Tendon Exposed: No Muscle Exposed: No Joint Exposed: No Bone Exposed: No Limited to Skin Breakdown Periwound Skin Texture Texture Color No Abnormalities Noted: No No Abnormalities Noted: No Callus: No Atrophie Blanche: No Crepitus: No Cyanosis: No Excoriation: No Ecchymosis: No Fluctuance: No Erythema: No Friable: No Hemosiderin Staining: Yes Induration: No Mottled: No Localized Edema: Yes Pallor: No Rash: No Rubor: No Scarring: Yes Temperature / Pain Moisture Temperature: No Abnormality No Abnormalities Noted: No Tenderness on Palpation: Yes Dry / Scaly: No Maceration: Yes Moist: Yes Wound Preparation Ulcer Cleansing: Other: soap and water, Topical Anesthetic Applied: Other: lidocaine 4%, Treatment Notes Wound #2 (Right, Circumferential Lower Leg) 1. Cleansed with: Clean wound with Normal  Saline 2. Anesthetic Topical Lidocaine 4% cream to wound bed prior to debridement 4. Dressing Applied: Aquacel Ag 5. Secondary Dressing Applied ABD Pad 7. Secured with 3 Layer Compression System - Bilateral Electronic Signature(s) Signed: 08/23/2016 4:42:59 PM By: Montey Hora Entered By: Montey Hora on 08/23/2016 12:37:25 [name] ([id number]) -------------------------------------------------------------------------------- [place] Details Patient Name: DAWNNA, BORROEL. Date of Service: 08/23/2016 8:00 AM Medical Record Number: [id number] Patient Account Number: [id number] Date of Birth/Sex: September 07, 1951 (64 y.o. Female) Treating RN: Montey Hora Primary Care Physician: FITZGERALD, DAVID Other Clinician: Referring Physician: FITZGERALD, DAVID Treating Physician/Extender: Frann Rider in Treatment: 9 Vital Signs Time Taken: 08:10 Temperature (F): 98.1 Height (in): 67 Pulse (bpm): 68 Weight (lbs): 300 Respiratory Rate (breaths/min): 20 Body Mass Index (BMI): 47 Blood Pressure (mmHg): 193/53 Reference Range: 80 - 120 mg / dl Electronic Signature(s) Signed: 08/23/2016 4:42:59 PM By: Montey Hora Entered By: Montey Hora on 08/23/2016 08:12:40

## 2016-08-24 NOTE — Progress Notes (Signed)
Brooke Hopkins ([id number]) Visit Report for 08/23/2016 Chief Complaint Document Details Patient Name: Brooke Hopkins, Brooke Hopkins. Date of Service: 08/23/2016 8:00 AM Medical Record Number: [id number] Patient Account Number: [id number] Date of Birth/Sex: [birth year] (65 y.o. Female) Treating RN: Montey Hora Primary Care Physician: FITZGERALD, DAVID Other Clinician: Referring Physician: FITZGERALD, DAVID Treating Physician/Extender: Frann Rider in Treatment: 9 Information Obtained from: Patient Chief Complaint Patients presents for treatment of an open diabetic ulcer and significant lymphedema which she's had for about two years Electronic Signature(s) Signed: 08/23/2016 9:10:35 AM By: Christin Fudge MD, FACS Entered By: Christin Fudge on 08/23/2016 09:10:35 Brooke Hopkins ([id number]) -------------------------------------------------------------------------------- HPI Details Patient Name: Brooke Hopkins. Date of Service: 08/23/2016 8:00 AM Medical Record Number: [id number] Patient Account Number: [id number] Date of Birth/Sex: 1951/05/16 (65 y.o. Female) Treating RN: Montey Hora Primary Care Physician: FITZGERALD, DAVID Other Clinician: Referring Physician: FITZGERALD, DAVID Treating Physician/Extender: Frann Rider in Treatment: 9 History of Present Illness Location: massive swelling of both lower extremities and ulceration both lower extremities Quality: Patient reports experiencing a dull pain to affected area(s). Severity: Patient states wound are getting worse. Duration: Patient has had the wound for >2 years prior to seeking treatment at the wound center Timing: Pain in wound is constant (hurts all the time) Context: The wound appeared gradually over time Modifying Factors: Other treatment(s) tried include:she has a lymphedema pump but uses it seldom and she's had several course of antibiotics Associated Signs and Symptoms: Patient reports having  difficulty standing for long periods. HPI Description: 65 year old patient seen by Dr. Ola Spurr of infectious disease who has been following up for left lower extremity cellulitis and ulcer with recurrent bilateral lower extremity problems for several months. Recently she had a large right lower extremity bullae which opened out and has been ulcerated. She has seen the vascular group and has been getting Unna's wraps and has a lymphedema pump used in the past. Her prior cultures were positive for Pseudomonas, Proteus and was treated with amoxicillin. He has also been treated with 2 weeks course of ciprofloxacin and amoxicillin.. Increase of Lasix dose helped with the edema and echo showed no systolic CHF but may be diastolic problems. Past medical history significant for diabetes mellitus type 2, venous stasis ulcer, obesity, diabetic peripheral neuropathy, status post back surgery, cholecystectomy, hysterectomy, arthroscopy of the knee. He is a former smoker and quit smoking in 1984. The patient has seen Dr. Delana Meyer who did not recommend any arterial or venous duplex studies and has been using Unna's wraps and also recommended a lymphedema pump. she has been very noncompliant with using these. 06/28/2016 -- the patient is still on antibiotics as prescribed by Dr. Ola Spurr and he is asked her to take it for 3 weeks. The patient also says she has a lot of redness and pain in the folds of her thigh and lower extremity and this is very painful. 07/26/2016 -- the patient is off antibiotics and has been getting dressing changes 3 times a week. 08/09/2016 -- is been on Cipro and is taking potassium supplements along with her Lasix and is going to be seeing Dr. Ola Spurr for a consult return visit only in November 2017. 08/23/2016 -- she was admitted to the hospital last week and I have reviewed these reports in detail. She was seen by Dr. Ola Spurr who noted a Pseudomonas infection which was  resistant to ciprofloxacin and discharged her on Ceftazidime as a dean 1 g IV every 12 hourly anyone days. The antibiotic was  to be stopped on September 11 and he would see her back in the clinic. Electronic Signature(s) Brooke Hopkins, Brooke Hopkins ([id number]) Signed: 08/23/2016 9:13:54 AM By: Christin Fudge MD, FACS Entered By: Christin Fudge on 08/23/2016 09:13:54 Brooke Hopkins, Brooke Hopkins ([id number]) -------------------------------------------------------------------------------- Physical Exam Details Patient Name: Brooke Hopkins. Date of Service: 08/23/2016 8:00 AM Medical Record Number: [id number] Patient Account Number: [id number] Date of Birth/Sex: July 16, 1951 (65 y.o. Female) Treating RN: Montey Hora Primary Care Physician: FITZGERALD, DAVID Other Clinician: Referring Physician: FITZGERALD, DAVID Treating Physician/Extender: Frann Rider in Treatment: 9 Constitutional . Pulse regular. Respirations normal and unlabored. Afebrile. . Eyes Nonicteric. Reactive to light. Ears, Nose, Mouth, and Throat Lips, teeth, and gums WNL.Marland Kitchen Moist mucosa without lesions. Neck supple and nontender. No palpable supraclavicular or cervical adenopathy. Normal sized without goiter. Respiratory WNL. No retractions.. Cardiovascular Pedal Pulses WNL. No clubbing, cyanosis or edema. Lymphatic No adneopathy. No adenopathy. No adenopathy. Musculoskeletal Adexa without tenderness or enlargement.. Digits and nails w/o clubbing, cyanosis, infection, petechiae, ischemia, or inflammatory conditions.. Integumentary (Hair, Skin) No suspicious lesions. No crepitus or fluctuance. No peri-wound warmth or erythema. No masses.Marland Kitchen Psychiatric Judgement and insight Intact.. No evidence of depression, anxiety, or agitation.. Notes there is no evidence of significant cellulitis or lymphedema out of the ordinary. Her wounds have some superficial debris which have been able to wash off with moist saline gauze and the base  of the wounds look healthy. Electronic Signature(s) Signed: 08/23/2016 9:14:46 AM By: Christin Fudge MD, FACS Entered By: Christin Fudge on 08/23/2016 09:14:46 Brooke Hopkins ([id number]) -------------------------------------------------------------------------------- Physician Orders Details Patient Name: BAELEY, ORLIKOWSKI. Date of Service: 08/23/2016 8:00 AM Medical Record Number: [id number] Patient Account Number: [id number] Date of Birth/Sex: 1951-07-01 (65 y.o. Female) Treating RN: Montey Hora Primary Care Physician: FITZGERALD, DAVID Other Clinician: Referring Physician: FITZGERALD, DAVID Treating Physician/Extender: Frann Rider in Treatment: 9 Verbal / Phone Orders: Yes Clinician: Montey Hora Read Back and Verified: Yes Diagnosis Coding Wound Cleansing Wound #1 Left,Circumferential Lower Leg o Cleanse wound with mild soap and water o May Shower, gently pat wound dry prior to applying new dressing. o May shower with protection. Wound #2 Right,Circumferential Lower Leg o Cleanse wound with mild soap and water o May Shower, gently pat wound dry prior to applying new dressing. o May shower with protection. Anesthetic Wound #1 Left,Circumferential Lower Leg o Topical Lidocaine 4% cream applied to wound bed prior to debridement Wound #2 Right,Circumferential Lower Leg o Topical Lidocaine 4% cream applied to wound bed prior to debridement Skin Barriers/Peri-Wound Care Wound #1 Left,Circumferential Lower Leg o Other: - Lotrisone cream in folds at knees and on reddened areas on legs (Nystatin used in clinic) Wound #2 Right,Circumferential Lower Leg o Other: - Lotrisone cream in folds at knees and on reddened areas on legs (Nystatin used in clinic) Primary Wound Dressing Wound #1 Left,Circumferential Lower Leg o Aquacel Ag Wound #2 Right,Circumferential Lower Leg o Aquacel Ag Secondary Dressing Wound #1 Left,Circumferential Lower  Leg o ABD pad o XtraSorb - if needed MODESTINE, Brooke Hopkins ([id number]) Wound #2 Right,Circumferential Lower Leg o ABD pad o XtraSorb - if needed Dressing Change Frequency Wound #1 Left,Circumferential Lower Leg o Change Dressing Monday, Wednesday, Friday Wound #2 Right,Circumferential Lower Leg o Change Dressing Monday, Wednesday, Friday Follow-up Appointments Wound #1 Left,Circumferential Lower Leg o Return Appointment in 1 week. Wound #2 Right,Circumferential Lower Leg o Return Appointment in 1 week. Edema Control Wound #1 Left,Circumferential Lower Leg o 3 Layer Compression System - Bilateral - May anchor  top of wrap with dome paste, but only wrap around leg one time. o Elevate legs to the level of the heart and pump ankles as often as possible Wound #2 Right,Circumferential Lower Leg o 3 Layer Compression System - Bilateral - May anchor top of wrap with dome paste, but only wrap around leg one time. o Elevate legs to the level of the heart and pump ankles as often as possible Home Health Wound #1 Left,Circumferential Lower Leg o [place] Visits - [place] Nurse may visit PRN to address patientos wound care needs. o FACE TO FACE ENCOUNTER: MEDICARE and MEDICAID PATIENTS: I certify that this patient is under my care and that I had a face-to-face encounter that meets the physician face-to-face encounter requirements with this patient on this date. The encounter with the patient was in whole or in part for the following MEDICAL CONDITION: (primary reason for [place]) MEDICAL NECESSITY: I certify, that based on my findings, NURSING services are a medically necessary home health service. HOME BOUND STATUS: I certify that my clinical findings support that this patient is homebound (i.e., Due to illness or injury, pt requires aid of supportive devices such as crutches, cane, wheelchairs, walkers, the use of  special transportation or the assistance of another person to leave their place of residence. There is a normal inability to leave the home and doing so requires considerable and taxing effort. Other absences are for medical reasons / religious services and are infrequent or of short duration when for other reasons). o Please direct any NON-WOUND related issues/requests for orders to patient's Primary Care Physician Brooke Hopkins, Brooke Hopkins ([id number]) Wound #2 Right,Circumferential Lower Leg o [place] Nurse may visit PRN to address patientos wound care needs. o FACE TO FACE ENCOUNTER: MEDICARE and MEDICAID PATIENTS: I certify that this patient is under my care and that I had a face-to-face encounter that meets the physician face-to-face encounter requirements with this patient on this date. The encounter with the patient was in whole or in part for the following MEDICAL CONDITION: (primary reason for [place]) MEDICAL NECESSITY: I certify, that based on my findings, NURSING services are a medically necessary home health service. HOME BOUND STATUS: I certify that my clinical findings support that this patient is homebound (i.e., Due to illness or injury, pt requires aid of supportive devices such as crutches, cane, wheelchairs, walkers, the use of special transportation or the assistance of another person to leave their place of residence. There is a normal inability to leave the home and doing so requires considerable and taxing effort. Other absences are for medical reasons / religious services and are infrequent or of short duration when for other reasons). o Please direct any NON-WOUND related issues/requests for orders to patient's Primary Care Physician Electronic Signature(s) Signed: 08/23/2016 4:35:01 PM By: Christin Fudge MD, FACS Signed: 08/23/2016 4:42:59 PM By: Montey Hora Entered By: Montey Hora on 08/23/2016  08:48:12 Brooke Hopkins ([id number]) -------------------------------------------------------------------------------- Problem List Details Patient Name: Brooke Hopkins, Brooke Hopkins. Date of Service: 08/23/2016 8:00 AM Medical Record Number: [id number] Patient Account Number: [id number] Date of Birth/Sex: 1951/12/08 (65 y.o. 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 Date of Service: 08/23/2016 8:00 AM Medical Record Number: [id number] Patient Account Number: [id number] Date of Birth/Sex: Feb 03, 1951 (65 y.o. Female) Treating RN: Montey Hora Primary Care Physician: FITZGERALD, DAVID Other Clinician: Referring Physician: FITZGERALD, DAVID Treating Physician/Extender: Frann Rider in Treatment: 9 Subjective Chief Complaint Information obtained from Patient Patients presents for treatment of an open diabetic ulcer and significant lymphedema which she's had for about two years History of Present Illness (HPI) The  following HPI elements were documented for the patient's wound: Location: massive swelling of both lower extremities and ulceration both lower extremities Quality: Patient reports experiencing a dull pain to affected area(s). Severity: Patient states wound are getting worse. Duration: Patient has had the wound for >2 years prior to seeking treatment at the wound center Timing: Pain in wound is constant (hurts all the time) Context: The wound appeared gradually over time Modifying Factors: Other treatment(s) tried include:she has a lymphedema pump but uses it seldom and she's had several course of antibiotics Associated Signs and Symptoms: Patient reports having difficulty standing for long periods. 65 year old patient seen by Dr. Ola Spurr of infectious disease who has been following up for left lower extremity cellulitis and ulcer with recurrent bilateral lower extremity problems for several months. Recently she had a large right lower extremity bullae which opened out and has been ulcerated. She has seen the vascular group and has been getting Unna's wraps and has a lymphedema pump used in the past. Her prior cultures were positive for Pseudomonas, Proteus and was treated with amoxicillin. He has also been treated with 2 weeks course of ciprofloxacin and amoxicillin.. Increase of Lasix dose helped with the edema and echo showed no systolic CHF but may be diastolic problems. Past medical history significant for diabetes mellitus type 2, venous stasis ulcer, obesity, diabetic peripheral neuropathy, status post back surgery, cholecystectomy, hysterectomy, arthroscopy of the knee. He is a former smoker and quit smoking in 1984. The patient has seen Dr. Delana Meyer who did not recommend any arterial or venous duplex studies and has been using Unna's wraps and also recommended a lymphedema pump. she has been very noncompliant with using these. 06/28/2016 -- the patient is still on antibiotics as  prescribed by Dr. Ola Spurr and he is asked her to take it for 3 weeks. The patient also says she has a lot of redness and pain in the folds of her thigh and lower extremity and this is very painful. 07/26/2016 -- the patient is off antibiotics and has been getting dressing changes 3 times a week. 08/09/2016 -- is been on Cipro and is taking potassium supplements along with her Lasix and is going to be Brooke Hopkins, Brooke Hopkins. ([id number]) seeing Dr. Ola Spurr for a consult return visit only in November 2017. 08/23/2016 -- she was admitted to the hospital last week and I have reviewed these reports in detail. She was seen by Dr. Ola Spurr who noted a Pseudomonas infection which was resistant to ciprofloxacin and discharged her on Ceftazidime as a dean 1 g IV every 12 hourly anyone days. The antibiotic was to be stopped on  September 11 and he would see her back in the clinic. Objective Constitutional Pulse regular. Respirations normal and unlabored. Afebrile. Vitals Time Taken: 8:10 AM, Height: 67 in, Weight: 300 lbs, BMI: 47, Temperature: 98.1 F, Pulse: 68 bpm, Respiratory Rate: 20 breaths/min, Blood Pressure: 193/53 mmHg. Eyes Nonicteric. Reactive to light. Ears, Nose, Mouth, and Throat Lips, teeth, and gums WNL.Marland Kitchen Moist mucosa without lesions. Neck supple and nontender. No palpable supraclavicular or cervical adenopathy. Normal sized without goiter. Respiratory WNL. No retractions.. Cardiovascular Pedal Pulses WNL. No clubbing, cyanosis or edema. Lymphatic No adneopathy. No adenopathy. No adenopathy. Musculoskeletal Adexa without tenderness or enlargement.. Digits and nails w/o clubbing, cyanosis, infection, petechiae, ischemia, or inflammatory conditions.Marland Kitchen Psychiatric Judgement and insight Intact.. No evidence of depression, anxiety, or agitation.. General Notes: there is no evidence of significant cellulitis or lymphedema out of the ordinary. Her wounds have some superficial  debris which have been able to wash off with moist saline gauze and the base of the wounds look healthy. Brooke Hopkins, [id number] ([id number]) Integumentary (Hair, Skin) No suspicious lesions. No crepitus or fluctuance. No peri-wound warmth or erythema. No masses.. Wound #1 status is Open. Original cause of wound was Gradually Appeared. The wound is located on the Left,Circumferential Lower Leg. The wound measures 6cm length x 12cm width x 0.2cm depth; 56.549cm^2 area and 11.31cm^3 volume. The wound is limited to skin breakdown. There is no tunneling or undermining noted. There is a large amount of purulent drainage noted. The wound margin is flat and intact. There is no granulation within the wound bed. There is a large (67-100%) amount of necrotic tissue within the wound bed including Adherent Slough. The periwound skin appearance exhibited: Localized Edema, Scarring, Maceration, Moist, Hemosiderin Staining. The periwound skin appearance did not exhibit: Callus, Crepitus, Excoriation, Fluctuance, Friable, Induration, Rash, Dry/Scaly, Atrophie Blanche, Cyanosis, Ecchymosis, Mottled, Pallor, Rubor, Erythema. Periwound temperature was noted as No Abnormality. The periwound has tenderness on palpation. Wound #2 status is Open. Original cause of wound was Gradually Appeared. The wound is located on the Right,Circumferential Lower Leg. The wound measures 5cm length x 9.5cm width x 0.2cm depth; 37.306cm^2 area and 7.461cm^3 volume. The wound is limited to skin breakdown. There is no tunneling or undermining noted. There is a large amount of purulent drainage noted. The wound margin is indistinct and nonvisible. There is no granulation within the wound bed. There is a large (67-100%) amount of necrotic tissue within the wound bed including Adherent Slough. The periwound skin appearance exhibited: Localized Edema, Scarring, Maceration, Moist, Hemosiderin Staining. The periwound skin appearance did not  exhibit: Callus, Crepitus, Excoriation, Fluctuance, Friable, Induration, Rash, Dry/Scaly, Atrophie Blanche, Cyanosis, Ecchymosis, Mottled, Pallor, Rubor, Erythema. Periwound temperature was noted as No Abnormality. The periwound has tenderness on palpation. 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 May shower with protection. Wound #2 Right,Circumferential Lower Leg: Cleanse wound with mild soap and water May Shower, gently pat wound dry prior to applying new dressing. May shower with protection. Anesthetic: Wound #1 Left,Circumferential Lower Leg: Topical Lidocaine  4% cream applied to wound bed prior to debridement Wound #2 Right,Circumferential Lower Leg: Topical Lidocaine 4% cream applied to wound bed prior to debridement Skin Barriers/Peri-Wound Care: Wound #1 Left,Circumferential Lower Leg: Other: - Lotrisone cream in folds at knees and on reddened areas on legs (Nystatin used in clinic) Wound #2 Right,Circumferential Lower Leg: Other: - Lotrisone cream in folds at knees and on reddened areas on legs (Nystatin used in clinic) Primary Wound Dressing: Wound #1 Left,Circumferential Lower Leg: Aquacel Ag Wound #2 Right,Circumferential Lower Leg: Aquacel Ag Secondary Dressing: Wound #1 Left,Circumferential Lower Leg: ABD pad XtraSorb - if needed Wound #2 Right,Circumferential Lower Leg: ABD pad XtraSorb - if needed Dressing Change Frequency: Wound  #1 Left,Circumferential Lower Leg: Change Dressing Monday, Wednesday, Friday Wound #2 Right,Circumferential Lower Leg: Change Dressing Monday, Wednesday, Friday Follow-up Appointments: Wound #1 Left,Circumferential Lower Leg: Return Appointment in 1 week. Wound #2 Right,Circumferential Lower Leg: Return Appointment in 1 week. Edema Control: Wound #1 Left,Circumferential Lower Leg: 3 Layer Compression System - Bilateral - May anchor top of wrap with dome paste, but only wrap around leg one time. Elevate legs to the level of the heart and pump ankles as often as possible Wound #2 Right,Circumferential Lower Leg: 3 Layer Compression System - Bilateral - May anchor top of wrap with dome paste, but only wrap around leg one time. Elevate legs to the level of the heart and pump ankles as often as possible Home Health: Wound #1 Left,Circumferential Lower Leg: [place] [facility] - SABAH, FUSCO ([id number]) [place] Nurse may visit PRN to address patient s wound care needs. FACE TO FACE ENCOUNTER: MEDICARE and MEDICAID PATIENTS: I certify that this patient is under my care and that I had a face-to-face encounter that meets the physician face-to-face encounter requirements with this patient on this date. The encounter with the patient was in whole or in part for the following MEDICAL CONDITION: (primary reason for [place]) MEDICAL NECESSITY: I certify, that based on my findings, NURSING services are a medically necessary home health service. HOME BOUND STATUS: I certify that my clinical findings support that this patient is homebound (i.e., Due to illness or injury, pt requires aid of supportive devices such as crutches, cane, wheelchairs, walkers, the use of special transportation or the assistance of another person to leave their place of residence. There is a normal inability to leave the home and doing so requires considerable and taxing effort. Other  absences are for medical reasons / religious services and are infrequent or of short duration when for other reasons). Please direct any NON-WOUND related issues/requests for orders to patient's Primary Care Physician Wound #2 Right,Circumferential Lower Leg: [place] Nurse may visit PRN to address patient s wound care needs. FACE TO FACE ENCOUNTER: MEDICARE and MEDICAID PATIENTS: I certify that this patient is under my care and that I had a face-to-face encounter that meets the physician face-to-face encounter requirements with this patient on this date. The encounter with the patient was in whole or in part for the following MEDICAL CONDITION: (primary reason for [place]) MEDICAL NECESSITY: I certify, that based on my findings, NURSING services are a medically necessary home health service. HOME BOUND STATUS: I certify that my clinical findings support that this patient is homebound (i.e., Due to illness or injury, pt requires aid of supportive devices such as crutches, cane, wheelchairs, walkers, the use of special transportation or the assistance of another person to leave their place  of residence. There is a normal inability to leave the home and doing so requires considerable and taxing effort. Other absences are for medical reasons / religious services and are infrequent or of short duration when for other reasons). Please direct any NON-WOUND related issues/requests for orders to patient's Primary Care Physician we will continue with silver alginate and a 3 layer Profore wrap and she will continue with the IV antibiotics as per Dr. Ola Spurr. Elevation and exercises have been stressed and she is getting her dressing changes done 3 times a week. Electronic Signature(s) Signed: 08/23/2016 4:36:30 PM By: Christin Fudge MD, FACS Previous Signature: 08/23/2016 9:15:28 AM Version By: Christin Fudge MD, FACS Entered By: Christin Fudge on 08/23/2016  16:36:30 Brooke Hopkins ([id number]) -------------------------------------------------------------------------------- SuperBill Details Patient Name: CHANETA, SURIANO. Date of Service: 08/23/2016 Medical Record Number: [id number] Patient Account Number: [id number] Date of Birth/Sex: 24-Jul-1951 (65 y.o. 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

## 2016-08-28 DIAGNOSIS — I83023 Varicose veins of left lower extremity with ulcer of ankle: Secondary | ICD-10-CM | POA: Diagnosis not present

## 2016-08-28 DIAGNOSIS — L03119 Cellulitis of unspecified part of limb: Secondary | ICD-10-CM | POA: Diagnosis not present

## 2016-08-28 DIAGNOSIS — E1129 Type 2 diabetes mellitus with other diabetic kidney complication: Secondary | ICD-10-CM | POA: Diagnosis not present

## 2016-08-28 DIAGNOSIS — R809 Proteinuria, unspecified: Secondary | ICD-10-CM | POA: Diagnosis not present

## 2016-08-28 DIAGNOSIS — A498 Other bacterial infections of unspecified site: Secondary | ICD-10-CM | POA: Insufficient documentation

## 2016-08-28 DIAGNOSIS — L02419 Cutaneous abscess of limb, unspecified: Secondary | ICD-10-CM | POA: Diagnosis not present

## 2016-08-28 DIAGNOSIS — Z6841 Body Mass Index (BMI) 40.0 and over, adult: Secondary | ICD-10-CM | POA: Diagnosis not present

## 2016-08-28 DIAGNOSIS — B965 Pseudomonas (aeruginosa) (mallei) (pseudomallei) as the cause of diseases classified elsewhere: Secondary | ICD-10-CM | POA: Diagnosis not present

## 2016-08-30 ENCOUNTER — Encounter: Payer: 59 | Attending: Surgery | Admitting: Surgery

## 2016-08-30 DIAGNOSIS — L97821 Non-pressure chronic ulcer of other part of left lower leg limited to breakdown of skin: Secondary | ICD-10-CM | POA: Diagnosis not present

## 2016-08-30 DIAGNOSIS — L97811 Non-pressure chronic ulcer of other part of right lower leg limited to breakdown of skin: Secondary | ICD-10-CM | POA: Diagnosis not present

## 2016-08-30 DIAGNOSIS — B372 Candidiasis of skin and nail: Secondary | ICD-10-CM | POA: Diagnosis not present

## 2016-08-30 DIAGNOSIS — E1140 Type 2 diabetes mellitus with diabetic neuropathy, unspecified: Secondary | ICD-10-CM | POA: Insufficient documentation

## 2016-08-30 DIAGNOSIS — E11622 Type 2 diabetes mellitus with other skin ulcer: Secondary | ICD-10-CM | POA: Insufficient documentation

## 2016-08-30 DIAGNOSIS — I890 Lymphedema, not elsewhere classified: Secondary | ICD-10-CM | POA: Insufficient documentation

## 2016-08-30 DIAGNOSIS — L97222 Non-pressure chronic ulcer of left calf with fat layer exposed: Secondary | ICD-10-CM | POA: Insufficient documentation

## 2016-08-30 DIAGNOSIS — Z6841 Body Mass Index (BMI) 40.0 and over, adult: Secondary | ICD-10-CM | POA: Insufficient documentation

## 2016-08-30 DIAGNOSIS — L97212 Non-pressure chronic ulcer of right calf with fat layer exposed: Secondary | ICD-10-CM | POA: Insufficient documentation

## 2016-08-30 DIAGNOSIS — Z87891 Personal history of nicotine dependence: Secondary | ICD-10-CM | POA: Diagnosis not present

## 2016-08-30 NOTE — Progress Notes (Signed)
AALIYAN, RUCKS ([id number]) Visit Report for 08/30/2016 Chief Complaint Document Details Patient Name: Brooke Hopkins, Brooke Hopkins. Date of Service: 08/30/2016 9:00 AM Medical Record Number: [id number] Patient Account Number: [id number] Date of Birth/Sex: 06-14-1951 (65 y.o. Female) Treating RN: Ahmed Prima Primary Care Physician: FITZGERALD, DAVID Other Clinician: Referring Physician: FITZGERALD, DAVID Treating Physician/Extender: Frann Rider in Treatment: 10 Information Obtained from: Patient Chief Complaint Patients presents for treatment of an open diabetic ulcer and significant lymphedema which she's had for about two years Electronic Signature(s) Signed: 08/30/2016 9:41:30 AM By: Christin Fudge MD, FACS Entered By: Christin Fudge on 08/30/2016 09:41:30 Brooke Hopkins ([id number]) -------------------------------------------------------------------------------- HPI Details Patient Name: Brooke Hopkins, Brooke Hopkins. Date of Service: 08/30/2016 9:00 AM Medical Record Number: [id number] Patient Account Number: [id number] Date of Birth/Sex: [birth year] (65 y.o. Female) Treating RN: Ahmed Prima Primary Care Physician: FITZGERALD, DAVID Other Clinician: Referring Physician: FITZGERALD, DAVID Treating Physician/Extender: Frann Rider in Treatment: 10 History of Present Illness Location: massive swelling of both lower extremities and ulceration both lower extremities Quality: Patient reports experiencing a dull pain to affected area(s). Severity: Patient states wound are getting worse. Duration: Patient has had the wound for >2 years prior to seeking treatment at the wound center Timing: Pain in wound is constant (hurts all the time) Context: The wound appeared gradually over time Modifying Factors: Other treatment(s) tried include:she has a lymphedema pump but uses it seldom and she's had several course of antibiotics Associated Signs and Symptoms: Patient reports having  difficulty standing for long periods. HPI Description: 65 year old patient seen by Dr. Ola Spurr of infectious disease who has been following up for left lower extremity cellulitis and ulcer with recurrent bilateral lower extremity problems for several months. Recently she had a large right lower extremity bullae which opened out and has been ulcerated. She has seen the vascular group and has been getting Unna's wraps and has a lymphedema pump used in the past. Her prior cultures were positive for Pseudomonas, Proteus and was treated with amoxicillin. He has also been treated with 2 weeks course of ciprofloxacin and amoxicillin.. Increase of Lasix dose helped with the edema and echo showed no systolic CHF but may be diastolic problems. Past medical history significant for diabetes mellitus type 2, venous stasis ulcer, obesity, diabetic peripheral neuropathy, status post back surgery, cholecystectomy, hysterectomy, arthroscopy of the knee. He is a former smoker and quit smoking in 1984. The patient has seen Dr. Delana Meyer who did not recommend any arterial or venous duplex studies and has been using Unna's wraps and also recommended a lymphedema pump. she has been very noncompliant with using these. 06/28/2016 -- the patient is still on antibiotics as prescribed by Dr. Ola Spurr and he is asked her to take it for 3 weeks. The patient also says she has a lot of redness and pain in the folds of her thigh and lower extremity and this is very painful. 07/26/2016 -- the patient is off antibiotics and has been getting dressing changes 3 times a week. 08/09/2016 -- is been on Cipro and is taking potassium supplements along with her Lasix and is going to be seeing Dr. Ola Spurr for a consult return visit only in November 2017. 08/23/2016 -- she was admitted to the hospital last week and I have reviewed these reports in detail. She was seen by Dr. Ola Spurr who noted a Pseudomonas infection which was  resistant to ciprofloxacin and discharged her on Ceftazidime 1 g IV every 12 hourly anyone days. The antibiotic was to be stopped  on September 11 and he would see her back in the clinic. 08/30/2016 -- had a communication from Dr. Ola Spurr that he would extend her antibiotics by a week if she continued to look like cellulitis was persisting. Brooke Hopkins, Brooke Hopkins ([id number]) Electronic Signature(s) Signed: 08/30/2016 9:41:40 AM By: Christin Fudge MD, FACS Previous Signature: 08/30/2016 9:21:01 AM Version By: Christin Fudge MD, FACS Entered By: Christin Fudge on 08/30/2016 09:41:40 Brooke Hopkins ([id number]) -------------------------------------------------------------------------------- Physical Exam Details Patient Name: Brooke Hopkins, Brooke Hopkins. Date of Service: 08/30/2016 9:00 AM Medical Record Number: [id number] Patient Account Number: [id number] Date of Birth/Sex: [birth year] (65 y.o. Female) Treating RN: Ahmed Prima Primary Care Physician: FITZGERALD, DAVID Other Clinician: Referring Physician: FITZGERALD, DAVID Treating Physician/Extender: Frann Rider in Treatment: 10 Constitutional . Pulse regular. Respirations normal and unlabored. Afebrile. . Eyes Nonicteric. Reactive to light. Ears, Nose, Mouth, and Throat Lips, teeth, and gums WNL.Marland Kitchen Moist mucosa without lesions. Neck supple and nontender. No palpable supraclavicular or cervical adenopathy. Normal sized without goiter. Respiratory WNL. No retractions.. Breath sounds WNL, No rubs, rales, rhonchi, or wheeze.. Cardiovascular Heart rhythm and rate regular, no murmur or gallop.. Pedal Pulses WNL. No clubbing, cyanosis or edema. Chest Breasts symmetical and no nipple discharge.. Breast tissue WNL, no masses, lumps, or tenderness.. Lymphatic No adneopathy. No adenopathy. No adenopathy. Musculoskeletal Adexa without tenderness or enlargement.. Digits and nails w/o clubbing, cyanosis, infection, petechiae, ischemia, or  inflammatory conditions.. Integumentary (Hair, Skin) No suspicious lesions. No crepitus or fluctuance. No peri-wound warmth or erythema. No masses.Marland Kitchen Psychiatric Judgement and insight Intact.. No evidence of depression, anxiety, or agitation.. Notes wounds on the posterior aspect of both legs have significant amount of subcutaneous debris and we will use Santyl ointment for these. Rest of the wounds washed out well with moist saline gauze and we can use silver alginate on them. Electronic Signature(s) Signed: 08/30/2016 9:42:17 AM By: Christin Fudge MD, FACS Entered By: Christin Fudge on 08/30/2016 09:42:17 Brooke Hopkins ([id number]) -------------------------------------------------------------------------------- Physician Orders Details Patient Name: Brooke Hopkins, Brooke Hopkins. Date of Service: 08/30/2016 9:00 AM Medical Record Number: [id number] Patient Account Number: [id number] Date of Birth/Sex: [birth year] (65 y.o. Female) Treating RN: Montey Hora Primary Care Physician: FITZGERALD, DAVID Other Clinician: Referring Physician: FITZGERALD, DAVID Treating Physician/Extender: Frann Rider in Treatment: 10 Verbal / Phone Orders: Yes Clinician: Montey Hora Read Back and Verified: Yes Diagnosis Coding Wound Cleansing Wound #1 Left,Circumferential Lower Leg o Cleanse wound with mild soap and water o May Shower, gently pat wound dry prior to applying new dressing. o May shower with protection. Wound #2 Right,Circumferential Lower Leg o Cleanse wound with mild soap and water o May Shower, gently pat wound dry prior to applying new dressing. o May shower with protection. Anesthetic Wound #1 Left,Circumferential Lower Leg o Topical Lidocaine 4% cream applied to wound bed prior to debridement Wound #2 Right,Circumferential Lower Leg o Topical Lidocaine 4% cream applied to wound bed prior to debridement Skin Barriers/Peri-Wound Care Wound #1 Left,Circumferential  Lower Leg o Other: - Lotrisone cream in folds at knees and on reddened areas on legs (Nystatin used in clinic) Wound #2 Right,Circumferential Lower Leg o Other: - Lotrisone cream in folds at knees and on reddened areas on legs (Nystatin used in clinic) Primary Wound Dressing Wound #1 Left,Circumferential Lower Leg o Santyl Ointment - santyl on the posterior wound only o Aquacel Ag - aquacel ag on all other open areas Wound #2 Right,Circumferential Lower Leg o Santyl Ointment - santyl on the posterior wound only o Aquacel Ag -  aquacel ag on all other open areas Secondary Dressing Wound #1 Left,Circumferential Lower Leg ALDA, GAUTREAUX. ([id number]) o ABD pad o XtraSorb - if needed Wound #2 Right,Circumferential Lower Leg o ABD pad o XtraSorb - if needed Dressing Change Frequency Wound #1 Left,Circumferential Lower Leg o Other: - Tuesday and Friday - twice weekly - Fridays at [place] Wound #2 Right,Circumferential Lower Leg o Other: - Tuesday and Friday - twice weekly - Fridays at [place] Follow-up Appointments Wound #1 Left,Circumferential Lower Leg o Return Appointment in 1 week. Wound #2 Right,Circumferential Lower Leg o Return Appointment in 1 week. Edema Control Wound #1 Left,Circumferential Lower Leg o 3 Layer Compression System - Bilateral - May anchor top of wrap with dome paste, but only wrap around leg one time. o Elevate legs to the level of the heart and pump ankles as often as possible Wound #2 Right,Circumferential Lower Leg o 3 Layer Compression System - Bilateral - May anchor top of wrap with dome paste, but only wrap around leg one time. o Elevate legs to the level of the heart and pump ankles as often as possible Home Health Wound #1 Left,Circumferential Lower Leg o [place] Visits - [place] Nurse may visit PRN to address patientos wound care needs. o FACE TO  FACE ENCOUNTER: MEDICARE and MEDICAID PATIENTS: I certify that this patient is under my care and that I had a face-to-face encounter that meets the physician face-to-face encounter requirements with this patient on this date. The encounter with the patient was in whole or in part for the following MEDICAL CONDITION: (primary reason for [place]) MEDICAL NECESSITY: I certify, that based on my findings, NURSING services are a medically necessary home health service. HOME BOUND STATUS: I certify that my clinical findings support that this patient is homebound (i.e., Due to illness or injury, pt requires aid of supportive devices such as crutches, cane, wheelchairs, walkers, the use of special transportation or the assistance of another person to leave their place of residence. There is a normal inability to leave the home and doing so requires considerable and taxing effort. Other absences are for medical reasons / religious services and are infrequent or of short duration when for other reasons). STEFFIE, KLEINPETER ([id number]) o Please direct any NON-WOUND related issues/requests for orders to patient's Primary Care Physician Wound #2 Right,Circumferential Lower Leg o [place] Nurse may visit PRN to address patientos wound care needs. o FACE TO FACE ENCOUNTER: MEDICARE and MEDICAID PATIENTS: I certify that this patient is under my care and that I had a face-to-face encounter that meets the physician face-to-face encounter requirements with this patient on this date. The encounter with the patient was in whole or in part for the following MEDICAL CONDITION: (primary reason for [place]) MEDICAL NECESSITY: I certify, that based on my findings, NURSING services are a medically necessary home health service. HOME BOUND STATUS: I certify that my clinical findings support that this patient is homebound (i.e., Due to illness or injury, pt  requires aid of supportive devices such as crutches, cane, wheelchairs, walkers, the use of special transportation or the assistance of another person to leave their place of residence. There is a normal inability to leave the home and doing so requires considerable and taxing effort. Other absences are for medical reasons / religious services and are infrequent or of short duration when for other reasons). o Please direct any  NON-WOUND related issues/requests for orders to patient's Primary Care Physician Patient Medications Allergies: no know allergies Notifications Medication Indication Start End Santyl 08/30/2016 DOSE topical 250 unit/gram ointment - ointment topical as directed Electronic Signature(s) Signed: 08/30/2016 9:43:31 AM By: Christin Fudge MD, FACS Entered By: Christin Fudge on 08/30/2016 09:43:31 Brooke Hopkins ([id number]) -------------------------------------------------------------------------------- Problem List Details Patient Name: Brooke Hopkins, Brooke Hopkins. Date of Service: 08/30/2016 9:00 AM Medical Record Number: [id number] Patient Account Number: [id number] Date of Birth/Sex: 11-26-1951 (65 y.o. 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 Date of Service: 08/30/2016 9:00 AM Medical Record Number: [id number] Patient Account Number: [id number] Date of Birth/Sex: 24-Oct-1951 (65 y.o. Female) Treating RN: Ahmed Prima Primary Care Physician: FITZGERALD, DAVID Other Clinician: Referring Physician: FITZGERALD, DAVID Treating Physician/Extender: Frann Rider in Treatment: 10 Subjective Chief Complaint Information obtained from Patient Patients presents for treatment of an open diabetic ulcer and significant lymphedema which she's had for about two years History of Present Illness (HPI) The following HPI elements were documented for the patient's wound: Location: massive swelling of both lower extremities and ulceration both lower extremities Quality: Patient reports experiencing a dull pain to affected area(s). Severity: Patient states wound are getting worse. Duration: Patient has had the wound for >2 years prior to seeking treatment at the wound center Timing: Pain in wound is constant (hurts all the time) Context: The wound appeared gradually over time Modifying Factors: Other treatment(s) tried include:she has a lymphedema pump but uses it seldom and she's had several course of antibiotics Associated Signs and Symptoms: Patient reports having difficulty standing for long periods. 65 year old patient seen by Dr. Ola Spurr of infectious disease who has been following up for left lower extremity cellulitis and ulcer with recurrent bilateral lower extremity problems for several months. Recently she had a large right lower extremity bullae which opened out and has been ulcerated. She has seen the vascular group and has been getting Unna's wraps and has a lymphedema pump used in  the past. Her prior cultures were positive for Pseudomonas, Proteus and was treated with amoxicillin. He has also been treated with 2 weeks course of ciprofloxacin and amoxicillin.. Increase of Lasix dose helped with the edema and echo showed no systolic CHF but may be diastolic problems. Past medical history significant for diabetes mellitus type 2, venous stasis ulcer, obesity, diabetic peripheral neuropathy, status post back surgery, cholecystectomy, hysterectomy, arthroscopy of the knee. He is a former smoker and quit smoking in 1984. The patient has seen Dr. Delana Meyer who did not recommend any arterial or venous duplex studies and has been using Unna's wraps and also recommended a lymphedema pump. she has been very noncompliant with using these. 06/28/2016 -- the patient is still on antibiotics as prescribed by Dr. Ola Spurr and he is asked her to  take it for 3 weeks. The patient also says she has a lot of redness and pain in the folds of her thigh and lower extremity and this is very painful. 07/26/2016 -- the patient is off antibiotics and has been getting dressing changes 3 times a week. 08/09/2016 -- is been on Cipro and is taking potassium supplements along with her Lasix and is going to be Brooke Hopkins, Brooke Hopkins. ([id number]) seeing Dr. Ola Spurr for a consult return visit only in November 2017. 08/23/2016 -- she was admitted to the hospital last week and I have reviewed these reports in detail. She was seen by Dr. Ola Spurr who noted a Pseudomonas infection which was resistant to ciprofloxacin and discharged her on Ceftazidime 1 g IV every 12 hourly anyone days. The antibiotic was to be stopped on September 11 and he would see her back in the clinic. 08/30/2016 -- had a communication from Dr. Ola Spurr that he would extend her antibiotics by a week if she continued to look like cellulitis was persisting. Objective Constitutional Pulse regular. Respirations normal and unlabored.  Afebrile. Vitals Time Taken: 9:07 AM, Height: 67 in, Weight: 300 lbs, BMI: 47, Temperature: 98.2 F, Pulse: 70 bpm, Respiratory Rate: 20 breaths/min, Blood Pressure: 190/64 mmHg. Eyes Nonicteric. Reactive to light. Ears, Nose, Mouth, and Throat Lips, teeth, and gums WNL.Marland Kitchen Moist mucosa without lesions. Neck supple and nontender. No palpable supraclavicular or cervical adenopathy. Normal sized without goiter. Respiratory WNL. No retractions.. Breath sounds WNL, No rubs, rales, rhonchi, or wheeze.. Cardiovascular Heart rhythm and rate regular, no murmur or gallop.. Pedal Pulses WNL. No clubbing, cyanosis or edema. Chest Breasts symmetical and no nipple discharge.. Breast tissue WNL, no masses, lumps, or tenderness.. Lymphatic No adneopathy. No adenopathy. No adenopathy. Musculoskeletal Adexa without tenderness or enlargement.. Digits and nails w/o clubbing, cyanosis, infection, petechiae, ischemia, or inflammatory conditions.Marland Kitchen Psychiatric Brooke Hopkins, Brooke Hopkins ([id number]) Judgement and insight Intact.. No evidence of depression, anxiety, or agitation.. General Notes: wounds on the posterior aspect of both legs have significant amount of subcutaneous debris and we will use Santyl ointment for these. Rest of the wounds washed out well with moist saline gauze and we can use silver alginate on them. Integumentary (Hair, Skin) No suspicious lesions. No crepitus or fluctuance. No peri-wound warmth or erythema. No masses.. Wound #1 status is Open. Original cause of wound was Gradually Appeared. The wound is located on the Left,Circumferential Lower Leg. The wound measures 6cm length x 12cm width x 0.2cm depth; 56.549cm^2 area and 11.31cm^3 volume. The wound is limited to skin breakdown. There is no tunneling or undermining noted. There is a large amount of purulent drainage noted. The wound margin is flat and intact. There is no granulation within the wound bed. There is a large (67-100%) amount  of necrotic tissue within the wound bed including Adherent Slough. The periwound skin appearance exhibited: Localized Edema, Scarring, Maceration, Moist, Hemosiderin Staining. The periwound skin appearance did not exhibit: Callus, Crepitus, Excoriation, Fluctuance, Friable, Induration, Rash, Dry/Scaly, Atrophie Blanche, Cyanosis, Ecchymosis, Mottled, Pallor, Rubor, Erythema. Periwound temperature was noted as No Abnormality. The periwound has tenderness on palpation. Wound #2 status is Open. Original cause of wound was Gradually Appeared. The wound is located on the Right,Circumferential Lower Leg. The wound measures 5cm length x 9.5cm width x 0.2cm depth; 37.306cm^2 area and 7.461cm^3 volume. The wound is limited to skin breakdown. There is no tunneling or undermining noted. There is a large amount of purulent drainage noted. The wound margin is indistinct and nonvisible. There  is no granulation within the wound bed. There is a large (67-100%) amount of necrotic tissue within the wound bed including Adherent Slough. The periwound skin appearance exhibited: Localized Edema, Scarring, Maceration, Moist, Hemosiderin Staining. The periwound skin appearance did not exhibit: Callus, Crepitus, Excoriation, Fluctuance, Friable, Induration, Rash, Dry/Scaly, Atrophie Blanche, Cyanosis, Ecchymosis, Mottled, Pallor, Rubor, Erythema. Periwound temperature was noted as No Abnormality. The periwound has tenderness on palpation. 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 May shower with protection. Wound #2 Right,Circumferential Lower Leg: Cleanse wound with mild soap and water May Shower, gently pat wound dry prior to applying new dressing. May shower with protection. Anesthetic: Wound #1 Left,Circumferential Lower Leg: Topical Lidocaine 4% cream applied to wound bed prior to debridement Wound #2 Right,Circumferential Lower Leg: Topical Lidocaine 4% cream applied to wound bed prior to debridement Skin Barriers/Peri-Wound Care: Wound #1 Left,Circumferential Lower Leg: Other: - Lotrisone cream in folds at knees and on reddened areas on legs (Nystatin used in clinic) Wound #2 Right,Circumferential Lower Leg: Other: - Lotrisone cream in folds at knees and on reddened areas on legs (Nystatin used in clinic) Primary Wound Dressing: Wound #1 Left,Circumferential Lower Leg: Santyl Ointment - santyl on the posterior wound only Aquacel Ag - aquacel ag on all other open areas Wound #2 Right,Circumferential Lower Leg: Santyl Ointment - santyl on the posterior wound only Aquacel Ag - aquacel ag on all other open areas Secondary Dressing: Wound #1 Left,Circumferential Lower Leg: ABD pad XtraSorb - if needed Wound #2 Right,Circumferential Lower Leg: ABD pad XtraSorb - if needed Dressing Change Frequency: Wound #1 Left,Circumferential Lower Leg: Other: - Tuesday and Friday - twice weekly - Fridays at [place] Wound #2 Right,Circumferential Lower Leg: Other: - Tuesday and Friday - twice weekly - Fridays at [place] Follow-up Appointments: Wound #1 Left,Circumferential Lower Leg: Return Appointment in 1 week. Wound #2 Right,Circumferential Lower Leg: Return Appointment in 1 week. Edema Control: Wound #1 Left,Circumferential Lower Leg: Brooke Hopkins, Brooke Hopkins ([id number]) 3 Layer Compression System - Bilateral - May anchor top of wrap with  dome paste, but only wrap around leg one time. Elevate legs to the level of the heart and pump ankles as often as possible Wound #2 Right,Circumferential Lower Leg: 3 Layer Compression System - Bilateral - May anchor top of wrap with dome paste, but only wrap around leg one time. Elevate legs to the level of the heart and pump ankles as often as possible Home Health: Wound #1 Left,Circumferential Lower Leg: Continue Home Health Visits - [place] Nurse may visit PRN to address patient s wound care needs. FACE TO FACE ENCOUNTER: MEDICARE and MEDICAID PATIENTS: I certify that this patient is under my care and that I had a face-to-face encounter that meets the physician face-to-face encounter requirements with this patient on this date. The encounter with the patient was in whole or in part for the following MEDICAL CONDITION: (primary reason for [place]) MEDICAL NECESSITY: I certify, that based on my findings, NURSING  services are a medically necessary home health service. HOME BOUND STATUS: I certify that my clinical findings support that this patient is homebound (i.e., Due to illness or injury, pt requires aid of supportive devices such as crutches, cane, wheelchairs, walkers, the use of special transportation or the assistance of another person to leave their place of residence. There is a normal inability to leave the home and doing so requires considerable and taxing effort. Other absences are for medical reasons / religious services and are infrequent or of short duration when for other reasons). Please direct any NON-WOUND related issues/requests for orders to patient's Primary Care Physician Wound #2 Right,Circumferential Lower Leg: [facility] Nurse may visit PRN to address patient s wound care needs. FACE TO FACE ENCOUNTER: MEDICARE and MEDICAID PATIENTS: I certify that this patient is under my care and that I had a face-to-face  encounter that meets the physician face-to-face encounter requirements with this patient on this date. The encounter with the patient was in whole or in part for the following MEDICAL CONDITION: (primary reason for [place]) MEDICAL NECESSITY: I certify, that based on my findings, NURSING services are a medically necessary home health service. HOME BOUND STATUS: I certify that my clinical findings support that this patient is homebound (i.e., Due to illness or injury, pt requires aid of supportive devices such as crutches, cane, wheelchairs, walkers, the use of special transportation or the assistance of another person to leave their place of residence. There is a normal inability to leave the home and doing so requires considerable and taxing effort. Other absences are for medical reasons / religious services and are infrequent or of short duration when for other reasons). Please direct any NON-WOUND related issues/requests for orders to patient's Primary Care Physician The following medication(s) was prescribed: Santyl topical 250 unit/gram ointment ointment topical as directed starting 08/30/2016 the patient is looking better overall and her drainage has come down significantly and we can reduce her dressing changes to twice a week. We will continue to use silver alginate on most of the wounds. The wounds on the posterior aspect of both legs have significant amount of subcutaneous debris and we will use Santyl ointment for these. Rest of the wounds washed out well with moist saline gauze and we can use silver alginate on them. have a Profore lite applied bilaterally. We will see her back next week. Brooke Hopkins, Brooke Hopkins ([id number]) Electronic Signature(s) Signed: 08/30/2016 9:44:44 AM By: Christin Fudge MD, FACS Entered By: Christin Fudge on 08/30/2016 09:44:44 ZYONNA, STANKOVIC ([id number]) -------------------------------------------------------------------------------- SuperBill  Details Patient Name: ANGI, RAMEL. Date of Service: 08/30/2016 Medical Record Number: [id number] Patient Account Number: [id number] Date of Birth/Sex: Aug 22, 1951 (65 y.o. 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

## 2016-09-03 DIAGNOSIS — E1142 Type 2 diabetes mellitus with diabetic polyneuropathy: Secondary | ICD-10-CM | POA: Diagnosis not present

## 2016-09-03 DIAGNOSIS — I890 Lymphedema, not elsewhere classified: Secondary | ICD-10-CM | POA: Diagnosis not present

## 2016-09-03 DIAGNOSIS — E1129 Type 2 diabetes mellitus with other diabetic kidney complication: Secondary | ICD-10-CM | POA: Diagnosis not present

## 2016-09-03 DIAGNOSIS — E11649 Type 2 diabetes mellitus with hypoglycemia without coma: Secondary | ICD-10-CM | POA: Diagnosis not present

## 2016-09-03 DIAGNOSIS — I10 Essential (primary) hypertension: Secondary | ICD-10-CM | POA: Diagnosis not present

## 2016-09-03 DIAGNOSIS — L97909 Non-pressure chronic ulcer of unspecified part of unspecified lower leg with unspecified severity: Secondary | ICD-10-CM | POA: Diagnosis not present

## 2016-09-03 DIAGNOSIS — Z794 Long term (current) use of insulin: Secondary | ICD-10-CM | POA: Diagnosis not present

## 2016-09-03 NOTE — Progress Notes (Signed)
LORITA, SURI ([id number]) Visit Report for 08/30/2016 Arrival Information Details Patient Name: MESA, MEHRHOFF. Date of Service: 08/30/2016 9:00 AM Medical Record Number: [id number] Patient Account Number: [id number] Date of Birth/Sex: 1951/01/19 (65 y.o. Female) Treating RN: Ahmed Prima Primary Care Physician: FITZGERALD, DAVID Other Clinician: Referring Physician: FITZGERALD, DAVID Treating Physician/Extender: Frann Rider in Treatment: 10 Visit Information History Since Last Visit All ordered tests and consults were completed: No Patient Arrived: Wheel Chair Added or deleted any medications: No Arrival Time: 09:05 Any new allergies or adverse reactions: No Accompanied By: son Had a fall or experienced change in No activities of daily living that may affect Transfer Assistance: None risk of falls: Patient Identification Verified: Yes Signs or symptoms of abuse/neglect since last No Secondary Verification Process Yes visito Completed: Hospitalized since last visit: No Patient Requires Transmission-Based No Pain Present Now: No Precautions: Patient Has Alerts: No Electronic Signature(s) Signed: 09/03/2016 4:41:03 PM By: Alric Quan Entered By: Alric Quan on 08/30/2016 09:07:21 [name] ([id number]) -------------------------------------------------------------------------------- Encounter Discharge Information Details Patient Name: RAGNA, VOLKOV. Date of Service: 08/30/2016 9:00 AM Medical Record Number: [id number] Patient Account Number: [id number] Date of Birth/Sex: 1951-11-02 (65 y.o. Female) Treating RN: Ahmed Prima Primary Care Physician: FITZGERALD, DAVID Other Clinician: Referring Physician: FITZGERALD, DAVID Treating Physician/Extender: Frann Rider in Treatment: 10 Encounter Discharge Information Items Discharge Pain Level: 0 Discharge Condition: Stable Ambulatory Status: Wheelchair Discharge Destination:  Home Transportation: Private Auto Accompanied By: son Schedule Follow-up Appointment: Yes Medication Reconciliation completed Yes and provided to Patient/Care Mikey Maffett: Provided on Clinical Summary of Care: 08/30/2016 Form Type Recipient Paper Patient LB Electronic Signature(s) Signed: 08/30/2016 9:57:46 AM By: Ruthine Dose Entered By: Ruthine Dose on 08/30/2016 09:57:45 [name] ([id number]) -------------------------------------------------------------------------------- Lower Extremity Assessment Details Patient Name: LACHAKA, MASSER. Date of Service: 08/30/2016 9:00 AM Medical Record Number: [id number] Patient Account Number: [id number] Date of Birth/Sex: 1951-06-15 (65 y.o. Female) Treating RN: Ahmed Prima Primary Care Physician: FITZGERALD, DAVID Other Clinician: Referring Physician: FITZGERALD, DAVID Treating Physician/Extender: Frann Rider in Treatment: 10 Edema Assessment Assessed: [Left: No] [Right: No] E[Left: dema] [Right: :] Calf Left: Right: Point of Measurement: 32 cm From Medial Instep 54.8 cm 54.4 cm Ankle Left: Right: Point of Measurement: 11 cm From Medial Instep 34.8 cm 32.8 cm Vascular Assessment Pulses: Posterior Tibial Dorsalis Pedis Palpable: [Left:Yes] [Right:Yes] Extremity colors, hair growth, and conditions: Extremity Color: [Left:Red] [Right:Red] Temperature of Extremity: [Left:Warm] [Right:Warm] Toe Nail Assessment Left: Right: Thick: Yes Yes Discolored: Yes Yes Deformed: Yes Yes Improper Length and Hygiene: Yes Yes Electronic Signature(s) Signed: 09/03/2016 4:41:03 PM By: Alric Quan Entered By: Alric Quan on 08/30/2016 09:25:08 [name] ([id number]) -------------------------------------------------------------------------------- Multi Wound Chart Details Patient Name: VIRGA, KOELSCH. Date of Service: 08/30/2016 9:00 AM Medical Record Number: [id number] Patient Account Number:  [id number] Date of Birth/Sex: 1951/11/20 (65 y.o. Female) Treating RN: Montey Hora Primary Care Physician: FITZGERALD, DAVID Other Clinician: Referring Physician: FITZGERALD, DAVID Treating Physician/Extender: Frann Rider in Treatment: 10 Vital Signs Height(in): 67 Pulse(bpm): 70 Weight(lbs): 300 Blood Pressure 190/64 (mmHg): Body Mass Index(BMI): 47 Temperature(F): 98.2 Respiratory Rate 20 (breaths/min): Photos: [1:No Photos] [2:No Photos] [N/A:N/A] Wound Location: [1:Left Lower Leg - Circumfernential] [2:Right Lower Leg - Circumfernential] [N/A:N/A] Wounding Event: [1:Gradually Appeared] [2:Gradually Appeared] [N/A:N/A] Primary Etiology: [1:Lymphedema] [2:Lymphedema] [N/A:N/A] Date Acquired: [1:12/31/2015] [2:12/31/2015] [N/A:N/A] Weeks of Treatment: [1:10] [2:10] [N/A:N/A] Wound Status: [1:Open] [2:Open] [N/A:N/A] Clustered Wound: [1:No] [2:Yes] [N/A:N/A] Clustered Quantity: [1:N/A] [2:3] [N/A:N/A] Measurements L x W x D 6x12x0.2 [2:5x9.5x0.2] [  N/A:N/A] (cm) Area (cm) : [1:56.549] [2:37.306] [N/A:N/A] Volume (cm) : [1:11.31] [2:7.461] [N/A:N/A] % Reduction in Area: [1:43.70%] [2:83.50%] [N/A:N/A] % Reduction in Volume: 43.70% [2:83.50%] [N/A:N/A] Classification: [1:Partial Thickness] [2:Partial Thickness] [N/A:N/A] Exudate Amount: [1:Large] [2:Large] [N/A:N/A] Exudate Type: [1:Purulent] [2:Purulent] [N/A:N/A] Exudate Color: [1:yellow, brown, green] [2:yellow, brown, green] [N/A:N/A] Foul Odor After [1:Yes] [2:Yes] [N/A:N/A] Cleansing: Odor Anticipated Due to No [2:No] [N/A:N/A] Product Use: Wound Margin: [1:Flat and Intact] [2:Indistinct, nonvisible] [N/A:N/A] Granulation Amount: [1:None Present (0%)] [2:None Present (0%)] [N/A:N/A] Necrotic Amount: [1:Large (67-100%)] [2:Large (67-100%)] [N/A:N/A] Exposed Structures: [1:Fascia: No Fat: No Tendon: No] [2:Fascia: No Fat: No Tendon: No] [N/A:N/A] Muscle: No Muscle: No Joint: No Joint: No Bone: No Bone:  No Limited to Skin Limited to Skin Breakdown Breakdown Epithelialization: None Small (1-33%) N/A Periwound Skin Texture: Edema: Yes Edema: Yes N/A Scarring: Yes Scarring: Yes Excoriation: No Excoriation: No Induration: No Induration: No Callus: No Callus: No Crepitus: No Crepitus: No Fluctuance: No Fluctuance: No Friable: No Friable: No Rash: No Rash: No Periwound Skin Maceration: Yes Maceration: Yes N/A Moisture: Moist: Yes Moist: Yes Dry/Scaly: No Dry/Scaly: No Periwound Skin Color: Hemosiderin Staining: Yes Hemosiderin Staining: Yes N/A Atrophie Blanche: No Atrophie Blanche: No Cyanosis: No Cyanosis: No Ecchymosis: No Ecchymosis: No Erythema: No Erythema: No Mottled: No Mottled: No Pallor: No Pallor: No Rubor: No Rubor: No Temperature: No Abnormality No Abnormality N/A Tenderness on Yes Yes N/A Palpation: Wound Preparation: Ulcer Cleansing: Other: Ulcer Cleansing: Other: N/A soap and water soap and water Topical Anesthetic Topical Anesthetic Applied: Other: lidocaine Applied: Other: lidocaine 4% 4% Treatment Notes Electronic Signature(s) Signed: 08/30/2016 4:27:47 PM By: Montey Hora Entered By: Montey Hora on 08/30/2016 09:33:25 [name] ([id number]) -------------------------------------------------------------------------------- Multi-Disciplinary Care Plan Details Patient Name: LAVENDA, VILLALOVOS. Date of Service: 08/30/2016 9:00 AM Medical Record Number: [id number] Patient Account Number: [id number] Date of Birth/Sex: [birth year] (65 y.o. Female) Treating RN: Montey Hora Primary Care Physician: FITZGERALD, DAVID Other Clinician: Referring Physician: FITZGERALD, DAVID Treating Physician/Extender: Frann Rider in Treatment: 10 Active Inactive Orientation to the Wound Care Program Nursing Diagnoses: Knowledge deficit related to the wound healing center program Goals: Patient/caregiver will verbalize understanding of the  [address] Date Initiated: 06/21/2016 Goal Status: Active Interventions: Provide education on orientation to the wound center Notes: Wound/Skin Impairment Nursing Diagnoses: Impaired tissue integrity Goals: Patient/caregiver will verbalize understanding of skin care regimen Date Initiated: 06/21/2016 Goal Status: Active Ulcer/skin breakdown will have a volume reduction of 30% by week 4 Date Initiated: 06/21/2016 Goal Status: Active Ulcer/skin breakdown will have a volume reduction of 50% by week 8 Date Initiated: 06/21/2016 Goal Status: Active Ulcer/skin breakdown will have a volume reduction of 80% by week 12 Date Initiated: 06/21/2016 Goal Status: Active Ulcer/skin breakdown will heal within 14 weeks Date Initiated: 06/21/2016 Goal Status: Active DAVANEE, COVERSTONE ([id number]) Interventions: Assess patient/caregiver ability to obtain necessary supplies Assess patient/caregiver ability to perform ulcer/skin care regimen upon admission and as needed Assess ulceration(s) every visit Provide education on ulcer and skin care Notes: Electronic Signature(s) Signed: 08/30/2016 4:27:47 PM By: Montey Hora Entered By: Montey Hora on 08/30/2016 09:33:12 [name] ([id number]) -------------------------------------------------------------------------------- Pain Assessment Details Patient Name: MASHALA, SARKA. Date of Service: 08/30/2016 9:00 AM Medical Record Number: [id number] Patient Account Number: [id number] Date of Birth/Sex: [birth year] (65 y.o. Female) Treating RN: Ahmed Prima Primary Care Physician: FITZGERALD, DAVID Other Clinician: Referring Physician: FITZGERALD, DAVID Treating Physician/Extender: Frann Rider in Treatment: 10 Active Problems Location of Pain Severity and Description of  Pain Patient Has Paino No Site Locations With Dressing Change: No Pain Management and Medication Current Pain Management: Electronic  Signature(s) Signed: 09/03/2016 4:41:03 PM By: Alric Quan Entered By: Alric Quan on 08/30/2016 09:07:30 [name] ([id number]) -------------------------------------------------------------------------------- Patient/Caregiver Education Details Patient Name: AUBRIEL, TOZIER. Date of Service: 08/30/2016 9:00 AM Medical Record Number: [id number] Patient Account Number: [id number] Date of Birth/Gender: 12/26/1951 (65 y.o. Female) Treating RN: Ahmed Prima Primary Care Physician: FITZGERALD, DAVID Other Clinician: Referring Physician: FITZGERALD, DAVID Treating Physician/Extender: Frann Rider in Treatment: 10 Education Assessment Education Provided To: Patient Education Topics Provided Wound/Skin Impairment: Handouts: Other: change dressing as ordered and do not get dressings wet Methods: Demonstration, Explain/Verbal Responses: State content correctly Electronic Signature(s) Signed: 09/03/2016 4:41:03 PM By: Alric Quan Entered By: Alric Quan on 08/30/2016 09:27:34 [name] ([id number]) -------------------------------------------------------------------------------- Wound Assessment Details Patient Name: BREKKEN, FANG. Date of Service: 08/30/2016 9:00 AM Medical Record Number: [id number] Patient Account Number: [id number] Date of Birth/Sex: 12-19-1951 (65 y.o. Female) Treating RN: Ahmed Prima Primary Care Physician: FITZGERALD, DAVID Other Clinician: Referring Physician: FITZGERALD, DAVID Treating Physician/Extender: Frann Rider in Treatment: 10 Wound Status Wound Number: 1 Primary Etiology: Lymphedema Wound Location: Left Lower Leg - Wound Status: Open Circumfernential Wounding Event: Gradually Appeared Date Acquired: 12/31/2015 Weeks Of Treatment: 10 Clustered Wound: No Photos Photo Uploaded By: Alric Quan on 08/30/2016 11:38:21 Wound Measurements Length: (cm) 6 Width: (cm) 12 Depth: (cm) 0.2 Area:  (cm) 56.549 Volume: (cm) 11.31 % Reduction in Area: 43.7% % Reduction in Volume: 43.7% Epithelialization: None Tunneling: No Undermining: No Wound Description Classification: Partial Thickness Wound Margin: Flat and Intact Exudate Amount: Large Exudate Type: Purulent Exudate Color: yellow, brown, green Foul Odor After Cleansing: Yes Due to Product Use: No Wound Bed Granulation Amount: None Present (0%) Exposed Structure Necrotic Amount: Large (67-100%) Fascia Exposed: No Necrotic Quality: Adherent Slough Fat Layer Exposed: No LAKIETA, MAKEY ([id number]) Tendon Exposed: No Muscle Exposed: No Joint Exposed: No Bone Exposed: No Limited to Skin Breakdown Periwound Skin Texture Texture Color No Abnormalities Noted: No No Abnormalities Noted: No Callus: No Atrophie Blanche: No Crepitus: No Cyanosis: No Excoriation: No Ecchymosis: No Fluctuance: No Erythema: No Friable: No Hemosiderin Staining: Yes Induration: No Mottled: No Localized Edema: Yes Pallor: No Rash: No Rubor: No Scarring: Yes Temperature / Pain Moisture Temperature: No Abnormality No Abnormalities Noted: No Tenderness on Palpation: Yes Dry / Scaly: No Maceration: Yes Moist: Yes Wound Preparation Ulcer Cleansing: Other: soap and water, Topical Anesthetic Applied: Other: lidocaine 4%, Treatment Notes Wound #1 (Left, Circumferential Lower Leg) 1. Cleansed with: Cleanse wound with antibacterial soap and water 2. Anesthetic Topical Lidocaine 4% cream to wound bed prior to debridement 4. Dressing Applied: Aquacel Ag Santyl Ointment 5. Secondary Dressing Applied ABD Pad Dry Gauze 7. Secured with Tape 3 Layer Compression System - Bilateral Notes unna to anchor ENESSA, CHARITY ([id number]) Electronic Signature(s) Signed: 09/03/2016 4:41:03 PM By: Alric Quan Entered By: Alric Quan on 08/30/2016 09:25:35 [name]  ([id number]) -------------------------------------------------------------------------------- Wound Assessment Details Patient Name: DUSTINE, LAPORTA. Date of Service: 08/30/2016 9:00 AM Medical Record Number: [id number] Patient Account Number: [id number] Date of Birth/Sex: [birth year] (65 y.o. Female) Treating RN: Ahmed Prima Primary Care Physician: FITZGERALD, DAVID Other Clinician: Referring Physician: FITZGERALD, DAVID Treating Physician/Extender: Frann Rider in Treatment: 10 Wound Status Wound Number: 2 Primary Etiology: Lymphedema Wound Location: Right Lower Leg - Wound Status: Open Circumfernential Wounding Event: Gradually Appeared Date Acquired: 12/31/2015 Weeks Of Treatment: 10 Clustered Wound:  Yes Photos Photo Uploaded By: Alric Quan on 08/30/2016 11:38:46 Wound Measurements Length: (cm) 5 Width: (cm) 9.5 Depth: (cm) 0.2 Clustered Quantity: 3 Area: (cm) 37.306 Volume: (cm) 7.461 % Reduction in Area: 83.5% % Reduction in Volume: 83.5% Epithelialization: Small (1-33%) Tunneling: No Undermining: No Wound Description Classification: Partial Thickness Wound Margin: Indistinct, nonvisible Exudate Amount: Large Exudate Type: Purulent Exudate Color: yellow, brown, green Foul Odor After Cleansing: Yes Due to Product Use: No Wound Bed Granulation Amount: None Present (0%) Exposed Structure Necrotic Amount: Large (67-100%) Fascia Exposed: No LUCIANNE, KOTLOWSKI ([id number]) Necrotic Quality: Adherent Slough Fat Layer Exposed: No Tendon Exposed: No Muscle Exposed: No Joint Exposed: No Bone Exposed: No Limited to Skin Breakdown Periwound Skin Texture Texture Color No Abnormalities Noted: No No Abnormalities Noted: No Callus: No Atrophie Blanche: No Crepitus: No Cyanosis: No Excoriation: No Ecchymosis: No Fluctuance: No Erythema: No Friable: No Hemosiderin Staining: Yes Induration: No Mottled: No Localized Edema: Yes Pallor:  No Rash: No Rubor: No Scarring: Yes Temperature / Pain Moisture Temperature: No Abnormality No Abnormalities Noted: No Tenderness on Palpation: Yes Dry / Scaly: No Maceration: Yes Moist: Yes Wound Preparation Ulcer Cleansing: Other: soap and water, Topical Anesthetic Applied: Other: lidocaine 4%, Treatment Notes Wound #2 (Right, Circumferential Lower Leg) 1. Cleansed with: Cleanse wound with antibacterial soap and water 2. Anesthetic Topical Lidocaine 4% cream to wound bed prior to debridement 4. Dressing Applied: Aquacel Ag Santyl Ointment 5. Secondary Dressing Applied ABD Pad Dry Gauze 7. Secured with Tape 3 Layer Compression System - Bilateral Notes unna to anchor REIANNA, BERGSTRESSER ([id number]) Electronic Signature(s) Signed: 09/03/2016 4:41:03 PM By: Alric Quan Entered By: Alric Quan on 08/30/2016 09:25:44 [name] ([id number]) -------------------------------------------------------------------------------- [place] Details Patient Name: ROCELYN, TURSO. Date of Service: 08/30/2016 9:00 AM Medical Record Number: [id number] Patient Account Number: [id number] Date of Birth/Sex: Aug 30, 1951 (65 y.o. Female) Treating RN: Ahmed Prima Primary Care Physician: FITZGERALD, DAVID Other Clinician: Referring Physician: FITZGERALD, DAVID Treating Physician/Extender: Frann Rider in Treatment: 10 Vital Signs Time Taken: 09:07 Temperature (F): 98.2 Height (in): 67 Pulse (bpm): 70 Weight (lbs): 300 Respiratory Rate (breaths/min): 20 Body Mass Index (BMI): 47 Blood Pressure (mmHg): 190/64 Reference Range: 80 - 120 mg / dl Electronic Signature(s) Signed: 09/03/2016 4:41:03 PM By: Alric Quan Entered By: Alric Quan on 08/30/2016 09:10:14

## 2016-09-06 ENCOUNTER — Encounter: Payer: 59 | Admitting: Surgery

## 2016-09-06 DIAGNOSIS — I890 Lymphedema, not elsewhere classified: Secondary | ICD-10-CM | POA: Diagnosis not present

## 2016-09-06 DIAGNOSIS — E11622 Type 2 diabetes mellitus with other skin ulcer: Secondary | ICD-10-CM | POA: Diagnosis not present

## 2016-09-06 DIAGNOSIS — L97811 Non-pressure chronic ulcer of other part of right lower leg limited to breakdown of skin: Secondary | ICD-10-CM | POA: Diagnosis not present

## 2016-09-06 DIAGNOSIS — L97821 Non-pressure chronic ulcer of other part of left lower leg limited to breakdown of skin: Secondary | ICD-10-CM | POA: Diagnosis not present

## 2016-09-07 NOTE — Progress Notes (Signed)
Brooke Hopkins, Brooke Hopkins ([id number]) Visit Report for 09/06/2016 Arrival Information Details Patient Name: Brooke Hopkins, Brooke Hopkins. Date of Service: 09/06/2016 9:30 AM Medical Record Number: [id number] Patient Account Number: [id number] Date of Birth/Sex: May 17, 1951 (64 y.o. Female) Treating RN: Ahmed Prima Primary Care Physician: FITZGERALD, DAVID Other Clinician: Referring Physician: FITZGERALD, DAVID Treating Physician/Extender: Frann Rider in Treatment: 11 Visit Information History Since Last Visit All ordered tests and consults were completed: No Patient Arrived: Wheel Chair Added or deleted any medications: No Arrival Time: 10:06 Any new allergies or adverse reactions: No Accompanied By: SON Had a fall or experienced change in No Transfer Assistance: [employer] activities of daily living that may affect Patient Lift risk of falls: Patient Identification Verified: Yes Signs or symptoms of abuse/neglect since last No Secondary Verification Process Yes visito Completed: Hospitalized since last visit: No Patient Requires Transmission- No Pain Present Now: No Based Precautions: Patient Has Alerts: No Electronic Signature(s) Signed: 09/06/2016 4:15:56 PM By: Alric Quan Entered By: Alric Quan on 09/06/2016 10:07:08 Brooke Hopkins ([id number]) -------------------------------------------------------------------------------- Encounter Discharge Information Details Patient Name: Brooke Hopkins, Brooke Hopkins. Date of Service: 09/06/2016 9:30 AM Medical Record Number: [id number] Patient Account Number: [id number] Date of Birth/Sex: 10/13/1951 (64 y.o. Female) Treating RN: Ahmed Prima Primary Care Physician: FITZGERALD, DAVID Other Clinician: Referring Physician: FITZGERALD, DAVID Treating Physician/Extender: Frann Rider in Treatment: 58 Encounter Discharge Information Items Discharge Pain Level: 0 Discharge Condition: Stable Ambulatory Status:  Wheelchair Discharge Destination: Home Transportation: Private Auto Accompanied By: son Schedule Follow-up Appointment: Yes Medication Reconciliation completed Yes and provided to Patient/Care Choice [place]: Provided on Clinical Summary of Care: 09/06/2016 Form Type Recipient Paper Patient LB Electronic Signature(s) Signed: 09/06/2016 10:51:25 AM By: Ruthine Dose Entered By: Ruthine Dose on 09/06/2016 10:51:25 Brooke Hopkins ([id number]) -------------------------------------------------------------------------------- Multi Wound Chart Details Patient Name: Brooke Hopkins. Date of Service: 09/06/2016 9:30 AM Medical Record Number: [id number] Patient Account Number: [id number] Date of Birth/Sex: 1951/12/24 (64 y.o. Female) Treating RN: Ahmed Prima Primary Care Physician: FITZGERALD, DAVID Other Clinician: Referring Physician: FITZGERALD, DAVID Treating Physician/Extender: Frann Rider in Treatment: 11 Vital Signs Height(in): 67 Pulse(bpm): 68 Weight(lbs): 300 Blood Pressure 188/64 (mmHg): Body Mass Index(BMI): 47 Temperature(F): 97.9 Respiratory Rate 20 (breaths/min): Photos: [1:No Photos] [2:No Photos] [N/A:N/A] Wound Location: [1:Left Lower Leg - Circumfernential] [2:Right Lower Leg - Circumfernential] [N/A:N/A] Wounding Event: [1:Gradually Appeared] [2:Gradually Appeared] [N/A:N/A] Primary Etiology: [1:Lymphedema] [2:Lymphedema] [N/A:N/A] Date Acquired: [1:12/31/2015] [2:12/31/2015] [N/A:N/A] Weeks of Treatment: [1:11] [2:11] [N/A:N/A] Wound Status: [1:Open] [2:Open] [N/A:N/A] Clustered Wound: [1:No] [2:Yes] [N/A:N/A] Clustered Quantity: [1:N/A] [2:3] [N/A:N/A] Measurements L x W x D 6x12x0.2 [2:5x9.5x0.2] [N/A:N/A] (cm) Area (cm) : [1:56.549] [2:37.306] [N/A:N/A] Volume (cm) : [1:11.31] [2:7.461] [N/A:N/A] % Reduction in Area: [1:43.70%] [2:83.50%] [N/A:N/A] % Reduction in Volume: 43.70% [2:83.50%] [N/A:N/A] Classification: [1:Partial Thickness]  [2:Partial Thickness] [N/A:N/A] Exudate Amount: [1:Large] [2:Large] [N/A:N/A] Exudate Type: [1:Purulent] [2:Serosanguineous] [N/A:N/A] Exudate Color: [1:yellow, brown, green] [2:red, brown] [N/A:N/A] Foul Odor After [1:Yes] [2:Yes] [N/A:N/A] Cleansing: Odor Anticipated Due to No [2:No] [N/A:N/A] Product Use: Wound Margin: [1:Flat and Intact] [2:Indistinct, nonvisible] [N/A:N/A] Granulation Amount: [1:None Present (0%)] [2:None Present (0%)] [N/A:N/A] Necrotic Amount: [1:Large (67-100%)] [2:Large (67-100%)] [N/A:N/A] Exposed Structures: [1:Fascia: No Fat: No Tendon: No] [2:Fascia: No Fat: No Tendon: No] [N/A:N/A] Muscle: No Muscle: No Joint: No Joint: No Bone: No Bone: No Limited to Skin Limited to Skin Breakdown Breakdown Epithelialization: None Small (1-33%) N/A Periwound Skin Texture: Edema: Yes Edema: Yes N/A Scarring: Yes Scarring: Yes Excoriation: No Excoriation: No Induration: No Induration: No Callus: No Callus: No Crepitus:  No Crepitus: No Fluctuance: No Fluctuance: No Friable: No Friable: No Rash: No Rash: No Periwound Skin Maceration: Yes Maceration: Yes N/A Moisture: Moist: Yes Moist: Yes Dry/Scaly: No Dry/Scaly: No Periwound Skin Color: Hemosiderin Staining: Yes Hemosiderin Staining: Yes N/A Atrophie Blanche: No Atrophie Blanche: No Cyanosis: No Cyanosis: No Ecchymosis: No Ecchymosis: No Erythema: No Erythema: No Mottled: No Mottled: No Pallor: No Pallor: No Rubor: No Rubor: No Temperature: No Abnormality No Abnormality N/A Tenderness on Yes Yes N/A Palpation: Wound Preparation: Ulcer Cleansing: Other: Ulcer Cleansing: Other: N/A soap and water soap and water Topical Anesthetic Topical Anesthetic Applied: Other: lidocaine Applied: Other: lidocaine 4% 4% Treatment Notes Electronic Signature(s) Signed: 09/06/2016 4:15:56 PM By: Alric Quan Entered By: Alric Quan on 09/06/2016 10:28:27 Brooke Hopkins  ([id number]) -------------------------------------------------------------------------------- [place] Details Patient Name: Brooke Hopkins, Brooke Hopkins. Date of Service: 09/06/2016 9:30 AM Medical Record Number: [id number] Patient Account Number: [id number] Date of Birth/Sex: 12/12/1951 (64 y.o. Female) Treating RN: Ahmed Prima Primary Care Physician: FITZGERALD, DAVID Other Clinician: Referring Physician: FITZGERALD, DAVID Treating Physician/Extender: Frann Rider in Treatment: 11 Active Inactive Orientation to the Wound Care Program Nursing Diagnoses: Knowledge deficit related to the wound healing center program Goals: Patient/caregiver will verbalize understanding of the [facility] Date Initiated: 06/21/2016 Goal Status: Active Interventions: Provide education on orientation to the wound center Notes: Wound/Skin Impairment Nursing Diagnoses: Impaired tissue integrity Goals: Patient/caregiver will verbalize understanding of skin care regimen Date Initiated: 06/21/2016 Goal Status: Active Ulcer/skin breakdown will have a volume reduction of 30% by week 4 Date Initiated: 06/21/2016 Goal Status: Active Ulcer/skin breakdown will have a volume reduction of 50% by week 8 Date Initiated: 06/21/2016 Goal Status: Active Ulcer/skin breakdown will have a volume reduction of 80% by week 12 Date Initiated: 06/21/2016 Goal Status: Active Ulcer/skin breakdown will heal within 14 weeks Date Initiated: 06/21/2016 Goal Status: Active Brooke Hopkins, Brooke Hopkins ([id number]) Interventions: Assess patient/caregiver ability to obtain necessary supplies Assess patient/caregiver ability to perform ulcer/skin care regimen upon admission and as needed Assess ulceration(s) every visit Provide education on ulcer and skin care Notes: Electronic Signature(s) Signed: 09/06/2016 4:15:56 PM By: Alric Quan Entered By: Alric Quan on 09/06/2016 10:27:47 Brooke Hopkins ([id number]) -------------------------------------------------------------------------------- Pain Assessment Details Patient Name: Brooke Hopkins, Brooke Hopkins. Date of Service: 09/06/2016 9:30 AM Medical Record Number: [id number] Patient Account Number: [id number] Date of Birth/Sex: January 12, 1951 (64 y.o. Female) Treating RN: Ahmed Prima Primary Care Physician: FITZGERALD, DAVID Other Clinician: Referring Physician: FITZGERALD, DAVID Treating Physician/Extender: Frann Rider in Treatment: 11 Active Problems Location of Pain Severity and Description of Pain Patient Has Paino No Site Locations With Dressing Change: No Pain Management and Medication Current Pain Management: Electronic Signature(s) Signed: 09/06/2016 4:15:56 PM By: Alric Quan Entered By: Alric Quan on 09/06/2016 10:07:14 Brooke Hopkins ([id number]) -------------------------------------------------------------------------------- Patient/Caregiver Education Details Patient Name: VERDENE, CRESON. Date of Service: 09/06/2016 9:30 AM Medical Record Number: [id number] Patient Account Number: [id number] Date of Birth/Gender: December 19, 1951 (64 y.o. Female) Treating RN: Ahmed Prima Primary Care Physician: FITZGERALD, DAVID Other Clinician: Referring Physician: FITZGERALD, DAVID Treating Physician/Extender: Frann Rider in Treatment: 11 Education Assessment Education Provided To: Patient Education Topics Provided Wound/Skin Impairment: Handouts: Other: do not get wraps wet Methods: Demonstration, Explain/Verbal Responses: State content correctly Electronic Signature(s) Signed: 09/06/2016 4:15:56 PM By: Alric Quan Entered By: Alric Quan on 09/06/2016 10:50:45 Brooke Hopkins ([id number]) -------------------------------------------------------------------------------- Wound Assessment Details Patient Name: Brooke Hopkins, Brooke Hopkins. Date of Service: 09/06/2016 9:30 AM Medical Record  Number: [id number] Patient Account  Number: [id number] Date of Birth/Sex: 10-19-1951 (65 y.o. Female) Treating RN: Ahmed Prima Primary Care Physician: FITZGERALD, DAVID Other Clinician: Referring Physician: FITZGERALD, DAVID Treating Physician/Extender: Frann Rider in Treatment: 11 Wound Status Wound Number: 1 Primary Etiology: Lymphedema Wound Location: Left Lower Leg - Wound Status: Open Circumfernential Wounding Event: Gradually Appeared Date Acquired: 12/31/2015 Weeks Of Treatment: 11 Clustered Wound: No Photos Photo Uploaded By: Alric Quan on 09/06/2016 16:05:07 Wound Measurements Length: (cm) 6 Width: (cm) 12 Depth: (cm) 0.2 Area: (cm) 56.549 Volume: (cm) 11.31 % Reduction in Area: 43.7% % Reduction in Volume: 43.7% Epithelialization: None Tunneling: No Undermining: No Wound Description Classification: Partial Thickness Wound Margin: Flat and Intact Exudate Amount: Large Exudate Type: Purulent Exudate Color: yellow, brown, green Foul Odor After Cleansing: Yes Due to Product Use: No Wound Bed Granulation Amount: None Present (0%) Exposed Structure Necrotic Amount: Large (67-100%) Fascia Exposed: No Necrotic Quality: Adherent Slough Fat Layer Exposed: No Brooke Hopkins, Brooke Hopkins ([id number]) Tendon Exposed: No Muscle Exposed: No Joint Exposed: No Bone Exposed: No Limited to Skin Breakdown Periwound Skin Texture Texture Color No Abnormalities Noted: No No Abnormalities Noted: No Callus: No Atrophie Blanche: No Crepitus: No Cyanosis: No Excoriation: No Ecchymosis: No Fluctuance: No Erythema: No Friable: No Hemosiderin Staining: Yes Induration: No Mottled: No Localized Edema: Yes Pallor: No Rash: No Rubor: No Scarring: Yes Temperature / Pain Moisture Temperature: No Abnormality No Abnormalities Noted: No Tenderness on Palpation: Yes Dry / Scaly: No Maceration: Yes Moist: Yes Wound Preparation Ulcer Cleansing: Other: soap and  water, Topical Anesthetic Applied: Other: lidocaine 4%, Treatment Notes Wound #1 (Left, Circumferential Lower Leg) 1. Cleansed with: Cleanse wound with antibacterial soap and water 2. Anesthetic Topical Lidocaine 4% cream to wound bed prior to debridement 4. Dressing Applied: Aquacel Ag Santyl Ointment 5. Secondary Dressing Applied ABD Pad 7. Secured with Tape 3 Layer Compression System - Bilateral Notes unna to [employer]) Brooke Hopkins, Brooke Hopkins ([id number]) Signed: 09/06/2016 4:15:56 PM By: Alric Quan Entered By: Alric Quan on 09/06/2016 10:21:39 Brooke Hopkins ([id number]) -------------------------------------------------------------------------------- Wound Assessment Details Patient Name: Brooke Hopkins, Brooke Hopkins. Date of Service: 09/06/2016 9:30 AM Medical Record Number: [id number] Patient Account Number: [id number] Date of Birth/Sex: 1951/10/15 (64 y.o. Female) Treating RN: Ahmed Prima Primary Care Physician: FITZGERALD, DAVID Other Clinician: Referring Physician: FITZGERALD, DAVID Treating Physician/Extender: Frann Rider in Treatment: 11 Wound Status Wound Number: 2 Primary Etiology: Lymphedema Wound Location: Right Lower Leg - Wound Status: Open Circumfernential Wounding Event: Gradually Appeared Date Acquired: 12/31/2015 Weeks Of Treatment: 11 Clustered Wound: Yes Photos Photo Uploaded By: Alric Quan on 09/06/2016 16:05:08 Wound Measurements Length: (cm) 5 Width: (cm) 9.5 Depth: (cm) 0.2 Clustered Quantity: 3 Area: (cm) 37.306 Volume: (cm) 7.461 % Reduction in Area: 83.5% % Reduction in Volume: 83.5% Epithelialization: Small (1-33%) Tunneling: No Undermining: No Wound Description Classification: Partial Thickness Wound Margin: Indistinct, nonvisible Exudate Amount: Large Exudate Type: Serosanguineous Exudate Color: red, brown Foul Odor After Cleansing: Yes Due to Product Use: No Wound Bed Granulation  Amount: None Present (0%) Exposed Structure Necrotic Amount: Large (67-100%) Fascia Exposed: No Brooke Hopkins, OELKERS ([id number]) Necrotic Quality: Adherent Slough Fat Layer Exposed: No Tendon Exposed: No Muscle Exposed: No Joint Exposed: No Bone Exposed: No Limited to Skin Breakdown Periwound Skin Texture Texture Color No Abnormalities Noted: No No Abnormalities Noted: No Callus: No Atrophie Blanche: No Crepitus: No Cyanosis: No Excoriation: No Ecchymosis: No Fluctuance: No Erythema: No Friable: No Hemosiderin Staining: Yes Induration: No Mottled: No Localized Edema: Yes Pallor: No Rash:  No Rubor: No Scarring: Yes Temperature / Pain Moisture Temperature: No Abnormality No Abnormalities Noted: No Tenderness on Palpation: Yes Dry / Scaly: No Maceration: Yes Moist: Yes Wound Preparation Ulcer Cleansing: Other: soap and water, Topical Anesthetic Applied: Other: lidocaine 4%, Treatment Notes Wound #2 (Right, Circumferential Lower Leg) 1. Cleansed with: Cleanse wound with antibacterial soap and water 2. Anesthetic Topical Lidocaine 4% cream to wound bed prior to debridement 4. Dressing Applied: Aquacel Ag Santyl Ointment 5. Secondary Dressing Applied ABD Pad 7. Secured with Tape 3 Layer Compression System - Bilateral Notes unna to anchor LIAH, MORR ([id number]) Electronic Signature(s) Signed: 09/06/2016 4:15:56 PM By: Alric Quan Entered By: Alric Quan on 09/06/2016 10:22:00 Brooke Hopkins ([id number]) -------------------------------------------------------------------------------- Vitals Details Patient Name: ZAYA, KESSENICH. Date of Service: 09/06/2016 9:30 AM Medical Record Number: [id number] Patient Account Number: [id number] Date of Birth/Sex: 03/03/1951 (64 y.o. Female) Treating RN: Ahmed Prima Primary Care Physician: FITZGERALD, DAVID Other Clinician: Referring Physician: FITZGERALD, DAVID Treating Physician/Extender:  Frann Rider in Treatment: 11 Vital Signs Time Taken: 10:09 Temperature (F): 97.9 Height (in): 67 Pulse (bpm): 68 Weight (lbs): 300 Respiratory Rate (breaths/min): 20 Body Mass Index (BMI): 47 Blood Pressure (mmHg): 188/64 Reference Range: 80 - 120 mg / dl Electronic Signature(s) Signed: 09/06/2016 4:15:56 PM By: Alric Quan Entered By: Alric Quan on 09/06/2016 10:13:22

## 2016-09-07 NOTE — Progress Notes (Signed)
VIRTIE, BUNGERT ([id number]) Visit Report for 09/06/2016 Chief Complaint Document Details Patient Name: SOLANGE, EMRY. Date of Service: 09/06/2016 9:30 AM Medical Record Number: [id number] Patient Account Number: [id number] Date of Birth/Sex: 11/02/1951 (65 y.o. Female) Treating RN: Ahmed Prima Primary Care Physician: FITZGERALD, DAVID Other Clinician: Referring Physician: FITZGERALD, DAVID Treating Physician/Extender: Frann Rider in Treatment: 11 Information Obtained from: Patient Chief Complaint Patients presents for treatment of an open diabetic ulcer and significant lymphedema which she's had for about two years Electronic Signature(s) Signed: 09/06/2016 10:33:15 AM By: Christin Fudge MD, FACS Entered By: Christin Fudge on 09/06/2016 10:33:15 [name] ([id number]) -------------------------------------------------------------------------------- Debridement Details Patient Name: SHAQUASIA, CAPONIGRO. Date of Service: 09/06/2016 9:30 AM Medical Record Number: [id number] Patient Account Number: [id number] Date of Birth/Sex: 06/10/1951 (65 y.o. Female) Treating RN: Ahmed Prima Primary Care Physician: FITZGERALD, DAVID Other Clinician: Referring Physician: FITZGERALD, DAVID Treating Physician/Extender: Frann Rider in Treatment: 11 Debridement Performed for Wound #1 Left,Circumferential Lower Leg Assessment: Performed By: Physician Christin Fudge, MD Debridement: Debridement Pre-procedure Yes - 10:29 Verification/Time Out Taken: Start Time: 10:31 Pain Control: Lidocaine 4% Topical Solution Level: Skin/Subcutaneous Tissue Total Area Debrided (L x 4 (cm) x 4 (cm) = 16 (cm) W): Tissue and other Viable, Non-Viable, Exudate, Fibrin/Slough, Subcutaneous material debrided: Instrument: Curette Bleeding: Minimum Hemostasis Achieved: Pressure End Time: 10:32 Procedural Pain: 0 Post Procedural Pain: 0 Response to Treatment: Procedure was tolerated  well Post Debridement Measurements of Total Wound Length: (cm) 6 Width: (cm) 12 Depth: (cm) 0.2 Volume: (cm) 11.31 Character of Wound/Ulcer Post Stable Debridement: Severity of Tissue Post Debridement: Fat layer exposed Post Procedure Diagnosis Same as Pre-procedure Electronic Signature(s) Signed: 09/06/2016 10:32:57 AM By: Christin Fudge MD, FACS Signed: 09/06/2016 4:15:56 PM By: Alric Quan Entered By: Christin Fudge on 09/06/2016 10:32:57 [name] ([id number]) [name], [name] ([id number]) -------------------------------------------------------------------------------- Debridement Details Patient Name: YALITZA, TEED. Date of Service: 09/06/2016 9:30 AM Medical Record Number: [id number] Patient Account Number: [id number] Date of Birth/Sex: July 02, 1951 (65 y.o. Female) Treating RN: Ahmed Prima Primary Care Physician: FITZGERALD, DAVID Other Clinician: Referring Physician: FITZGERALD, DAVID Treating Physician/Extender: Frann Rider in Treatment: 11 Debridement Performed for Wound #2 Right,Circumferential Lower Leg Assessment: Performed By: Physician Christin Fudge, MD Debridement: Debridement Pre-procedure Yes - 10:29 Verification/Time Out Taken: Start Time: 10:29 Pain Control: Lidocaine 4% Topical Solution Level: Skin/Subcutaneous Tissue Total Area Debrided (L x 4 (cm) x 3 (cm) = 12 (cm) W): Tissue and other Viable, Non-Viable, Exudate, Fibrin/Slough, Subcutaneous material debrided: Instrument: Curette Bleeding: Minimum Hemostasis Achieved: Pressure End Time: 10:30 Procedural Pain: 0 Post Procedural Pain: 0 Response to Treatment: Procedure was tolerated well Post Debridement Measurements of Total Wound Length: (cm) 5 Width: (cm) 9.5 Depth: (cm) 0.2 Volume: (cm) 7.461 Character of Wound/Ulcer Post Stable Debridement: Severity of Tissue Post Debridement: Fat layer exposed Post Procedure Diagnosis Same as Pre-procedure Electronic  Signature(s) Signed: 09/06/2016 10:33:08 AM By: Christin Fudge MD, FACS Signed: 09/06/2016 4:15:56 PM By: Alric Quan Entered By: Christin Fudge on 09/06/2016 10:33:07 [name] ([id number]) [name], [name] ([id number]) -------------------------------------------------------------------------------- HPI Details Patient Name: AMINTA, SAKURAI. Date of Service: 09/06/2016 9:30 AM Medical Record Number: [id number] Patient Account Number: [id number] Date of Birth/Sex: 03/29/1951 (65 y.o. Female) Treating RN: Ahmed Prima Primary Care Physician: FITZGERALD, DAVID Other Clinician: Referring Physician: FITZGERALD, DAVID Treating Physician/Extender: Frann Rider in Treatment: 11 History of Present Illness Location: massive swelling of both lower extremities and ulceration both lower extremities Quality: Patient reports experiencing a dull pain to  affected area(s). Severity: Patient states wound are getting worse. Duration: Patient has had the wound for >2 years prior to seeking treatment at the wound center Timing: Pain in wound is constant (hurts all the time) Context: The wound appeared gradually over time Modifying Factors: Other treatment(s) tried include:she has a lymphedema pump but uses it seldom and she's had several course of antibiotics Associated Signs and Symptoms: Patient reports having difficulty standing for long periods. HPI Description: 65 year old patient seen by Dr. Ola Spurr of infectious disease who has been following up for left lower extremity cellulitis and ulcer with recurrent bilateral lower extremity problems for several months. Recently she had a large right lower extremity bullae which opened out and has been ulcerated. She has seen the vascular group and has been getting Unna's wraps and has a lymphedema pump used in the past. Her prior cultures were positive for Pseudomonas, Proteus and was treated with amoxicillin. He has also been treated  with 2 weeks course of ciprofloxacin and amoxicillin.. Increase of Lasix dose helped with the edema and echo showed no systolic CHF but may be diastolic problems. Past medical history significant for diabetes mellitus type 2, venous stasis ulcer, obesity, diabetic peripheral neuropathy, status post back surgery, cholecystectomy, hysterectomy, arthroscopy of the knee. He is a former smoker and quit smoking in 1984. The patient has seen Dr. Delana Meyer who did not recommend any arterial or venous duplex studies and has been using Unna's wraps and also recommended a lymphedema pump. she has been very noncompliant with using these. 06/28/2016 -- the patient is still on antibiotics as prescribed by Dr. Ola Spurr and he is asked her to take it for 3 weeks. The patient also says she has a lot of redness and pain in the folds of her thigh and lower extremity and this is very painful. 07/26/2016 -- the patient is off antibiotics and has been getting dressing changes 3 times a week. 08/09/2016 -- is been on Cipro and is taking potassium supplements along with her Lasix and is going to be seeing Dr. Ola Spurr for a consult return visit only in November 2017. 08/23/2016 -- she was admitted to the hospital last week and I have reviewed these reports in detail. She was seen by Dr. Ola Spurr who noted a Pseudomonas infection which was resistant to ciprofloxacin and discharged her on Ceftazidime 1 g IV every 12 hourly anyone days. The antibiotic was to be stopped on September 11 and he would see her back in the clinic. 08/30/2016 -- had a communication from Dr. Ola Spurr that he would extend her antibiotics by a week if she continued to look like cellulitis was persisting. ELICA, ALMAS ([id number]) Electronic Signature(s) Signed: 09/06/2016 10:33:27 AM By: Christin Fudge MD, FACS Entered By: Christin Fudge on 09/06/2016 10:33:26 CALEAH, TORTORELLI  ([id number]) -------------------------------------------------------------------------------- Physical Exam Details Patient Name: MARISSAH, VANDEMARK. Date of Service: 09/06/2016 9:30 AM Medical Record Number: [id number] Patient Account Number: [id number] Date of Birth/Sex: 1951-04-26 (65 y.o. Female) Treating RN: Ahmed Prima Primary Care Physician: FITZGERALD, DAVID Other Clinician: Referring Physician: FITZGERALD, DAVID Treating Physician/Extender: Frann Rider in Treatment: 11 Constitutional . Pulse regular. Respirations normal and unlabored. Afebrile. . Eyes Nonicteric. Reactive to light. Ears, Nose, Mouth, and Throat Lips, teeth, and gums WNL.Marland Kitchen Moist mucosa without lesions. Neck supple and nontender. No palpable supraclavicular or cervical adenopathy. Normal sized without goiter. Respiratory WNL. No retractions.. Breath sounds WNL, No rubs, rales, rhonchi, or wheeze.. Cardiovascular Heart rhythm and rate regular, no murmur or gallop.. Pedal  Pulses WNL. No clubbing, cyanosis or edema. Lymphatic No adneopathy. No adenopathy. No adenopathy. Musculoskeletal Adexa without tenderness or enlargement.. Digits and nails w/o clubbing, cyanosis, infection, petechiae, ischemia, or inflammatory conditions.. Integumentary (Hair, Skin) No suspicious lesions. No crepitus or fluctuance. No peri-wound warmth or erythema. No masses.Marland Kitchen Psychiatric Judgement and insight Intact.. No evidence of depression, anxiety, or agitation.. Notes the posterior aspects of both legs continue to have subcutaneous debris and are sharply removed this with a #3 curet and bleeding controlled with pressure. We will use Santyl on these wounds. The rest of the wounds will be covered with silver alginate Electronic Signature(s) Signed: 09/06/2016 10:34:27 AM By: Christin Fudge MD, FACS Entered By: Christin Fudge on 09/06/2016 10:34:25 [name]  ([id number]) -------------------------------------------------------------------------------- Physician Orders Details Patient Name: RASHEDA, LEDGER. Date of Service: 09/06/2016 9:30 AM Medical Record Number: [id number] Patient Account Number: [id number] Date of Birth/Sex: 1951/11/05 (65 y.o. Female) Treating RN: Ahmed Prima Primary Care Physician: FITZGERALD, DAVID Other Clinician: Referring Physician: FITZGERALD, DAVID Treating Physician/Extender: Frann Rider in Treatment: 61 Verbal / Phone Orders: Yes Clinician: Carolyne Fiscal, Debi Read Back and Verified: Yes Diagnosis Coding Wound Cleansing Wound #1 Left,Circumferential Lower Leg o Cleanse wound with mild soap and water o May Shower, gently pat wound dry prior to applying new dressing. o May shower with protection. Wound #2 Right,Circumferential Lower Leg o Cleanse wound with mild soap and water o May Shower, gently pat wound dry prior to applying new dressing. o May shower with protection. Anesthetic Wound #1 Left,Circumferential Lower Leg o Topical Lidocaine 4% cream applied to wound bed prior to debridement Wound #2 Right,Circumferential Lower Leg o Topical Lidocaine 4% cream applied to wound bed prior to debridement Skin Barriers/Peri-Wound Care Wound #1 Left,Circumferential Lower Leg o Other: - Lotrisone cream in folds at knees and on reddened areas on legs (Nystatin used in clinic) Wound #2 Right,Circumferential Lower Leg o Other: - Lotrisone cream in folds at knees and on reddened areas on legs (Nystatin used in clinic) Primary Wound Dressing Wound #1 Left,Circumferential Lower Leg o Santyl Ointment - santyl on the posterior wound only o Aquacel Ag - aquacel ag on all other open areas Wound #2 Right,Circumferential Lower Leg o Santyl Ointment - santyl on the posterior wound only o Aquacel Ag - aquacel ag on all other open areas Secondary Dressing Wound #1 Left,Circumferential  Lower Leg ARIELL, GUNNELS ([id number]) o ABD pad o XtraSorb - if needed Wound #2 Right,Circumferential Lower Leg o ABD pad o XtraSorb - if needed Dressing Change Frequency Wound #1 Left,Circumferential Lower Leg o Other: - Tuesday and Friday - twice weekly - Fridays at [place] Wound #2 Right,Circumferential Lower Leg o Other: - Tuesday and Friday - twice weekly - Fridays at [place] Follow-up Appointments Wound #1 Left,Circumferential Lower Leg o Return Appointment in 1 week. Wound #2 Right,Circumferential Lower Leg o Return Appointment in 1 week. Edema Control Wound #1 Left,Circumferential Lower Leg o 3 Layer Compression System - Bilateral - May anchor top of wrap with dome paste, but only wrap around leg one time. o Elevate legs to the level of the heart and pump ankles as often as possible Wound #2 Right,Circumferential Lower Leg o 3 Layer Compression System - Bilateral - May anchor top of wrap with dome paste, but only wrap around leg one time. o Elevate legs to the level of the heart and pump ankles as often as possible Home Health Wound #1 Left,Circumferential Lower Leg o Continue  Home Health Visits - [place] Nurse may visit PRN to address patientos wound care needs. o FACE TO FACE ENCOUNTER: MEDICARE and MEDICAID PATIENTS: I certify that this patient is under my care and that I had a face-to-face encounter that meets the physician face-to-face encounter requirements with this patient on this date. The encounter with the patient was in whole or in part for the following MEDICAL CONDITION: (primary reason for [place]) MEDICAL NECESSITY: I certify, that based on my findings, NURSING services are a medically necessary home health service. HOME BOUND STATUS: I certify that my clinical findings support that this patient is homebound (i.e., Due to illness or injury, pt requires aid of supportive  devices such as crutches, cane, wheelchairs, walkers, the use of special transportation or the assistance of another person to leave their place of residence. There is a normal inability to leave the home and doing so requires considerable and taxing effort. Other absences are for medical reasons / religious services and are infrequent or of short duration when for other reasons). FRAYA, UEDA ([id number]) o Please direct any NON-WOUND related issues/requests for orders to patient's Primary Care Physician Wound #2 Right,Circumferential Lower Leg o [place] Nurse may visit PRN to address patientos wound care needs. o FACE TO FACE ENCOUNTER: MEDICARE and MEDICAID PATIENTS: I certify that this patient is under my care and that I had a face-to-face encounter that meets the physician face-to-face encounter requirements with this patient on this date. The encounter with the patient was in whole or in part for the following MEDICAL CONDITION: (primary reason for [place]) MEDICAL NECESSITY: I certify, that based on my findings, NURSING services are a medically necessary home health service. HOME BOUND STATUS: I certify that my clinical findings support that this patient is homebound (i.e., Due to illness or injury, pt requires aid of supportive devices such as crutches, cane, wheelchairs, walkers, the use of special transportation or the assistance of another person to leave their place of residence. There is a normal inability to leave the home and doing so requires considerable and taxing effort. Other absences are for medical reasons / religious services and are infrequent or of short duration when for other reasons). o Please direct any NON-WOUND related issues/requests for orders to patient's Primary Care Physician Electronic Signature(s) Signed: 09/06/2016 1:43:20 PM By: Christin Fudge MD, FACS Signed: 09/06/2016 4:15:56 PM By: Alric Quan Entered By: Alric Quan on 09/06/2016 10:32:07 [name] ([id number]) -------------------------------------------------------------------------------- Problem List Details Patient Name: ZAYLIA, RIOLO. Date of Service: 09/06/2016 9:30 AM Medical Record Number: [id number] Patient Account Number: [id number] Date of Birth/Sex: [birth year] (65 y.o. 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 Date of Service: 09/06/2016 9:30 AM Medical Record Number:  [id number] Patient Account Number: [id number] Date of Birth/Sex: June 20, 1951 (65 y.o. Female) Treating RN: Ahmed Prima Primary Care Physician: FITZGERALD, DAVID Other Clinician: Referring Physician: FITZGERALD, DAVID Treating Physician/Extender: Frann Rider in Treatment: 11 Subjective Chief Complaint Information obtained from Patient Patients presents for treatment of an open diabetic ulcer and significant lymphedema which she's had for about  two years History of Present Illness (HPI) The following HPI elements were documented for the patient's wound: Location: massive swelling of both lower extremities and ulceration both lower extremities Quality: Patient reports experiencing a dull pain to affected area(s). Severity: Patient states wound are getting worse. Duration: Patient has had the wound for >2 years prior to seeking treatment at the wound center Timing: Pain in wound is constant (hurts all the time) Context: The wound appeared gradually over time Modifying Factors: Other treatment(s) tried include:she has a lymphedema pump but uses it seldom and she's had several course of antibiotics Associated Signs and Symptoms: Patient reports having difficulty standing for long periods. 65 year old patient seen by Dr. Ola Spurr of infectious disease who has been following up for left lower extremity cellulitis and ulcer with recurrent bilateral lower extremity problems for several months. Recently she had a large right lower extremity bullae which opened out and has been ulcerated. She has seen the vascular group and has been getting Unna's wraps and has a lymphedema pump used in the past. Her prior cultures were positive for Pseudomonas, Proteus and was treated with amoxicillin. He has also been treated with 2 weeks course of ciprofloxacin and amoxicillin.. Increase of Lasix dose helped with the edema and echo showed no systolic CHF but may be diastolic problems. Past medical history significant for diabetes mellitus type 2, venous stasis ulcer, obesity, diabetic peripheral neuropathy, status post back surgery, cholecystectomy, hysterectomy, arthroscopy of the knee. He is a former smoker and quit smoking in 1984. The patient has seen Dr. Delana Meyer who did not recommend any arterial or venous duplex studies and has been using Unna's wraps and also recommended a lymphedema pump. she has been very noncompliant with using  these. 06/28/2016 -- the patient is still on antibiotics as prescribed by Dr. Ola Spurr and he is asked her to take it for 3 weeks. The patient also says she has a lot of redness and pain in the folds of her thigh and lower extremity and this is very painful. 07/26/2016 -- the patient is off antibiotics and has been getting dressing changes 3 times a week. 08/09/2016 -- is been on Cipro and is taking potassium supplements along with her Lasix and is going to be JOANETTE, SILVERIA. ([id number]) seeing Dr. Ola Spurr for a consult return visit only in November 2017. 08/23/2016 -- she was admitted to the hospital last week and I have reviewed these reports in detail. She was seen by Dr. Ola Spurr who noted a Pseudomonas infection which was resistant to ciprofloxacin and discharged her on Ceftazidime 1 g IV every 12 hourly anyone days. The antibiotic was to be stopped on September 11 and he would see her back in the clinic. 08/30/2016 -- had a communication from Dr. Ola Spurr that he would extend her antibiotics by a week if she continued to look like cellulitis was persisting. Objective Constitutional Pulse regular. Respirations normal and unlabored. Afebrile. Vitals Time Taken: 10:09 AM, Height: 67 in, Weight: 300 lbs, BMI: 47, Temperature: 97.9 F, Pulse: 68 bpm, Respiratory Rate: 20 breaths/min, Blood Pressure: 188/64 mmHg. Eyes Nonicteric. Reactive to light. Ears, Nose, Mouth, and Throat Lips, teeth,  and gums WNL.Marland Kitchen Moist mucosa without lesions. Neck supple and nontender. No palpable supraclavicular or cervical adenopathy. Normal sized without goiter. Respiratory WNL. No retractions.. Breath sounds WNL, No rubs, rales, rhonchi, or wheeze.. Cardiovascular Heart rhythm and rate regular, no murmur or gallop.. Pedal Pulses WNL. No clubbing, cyanosis or edema. Lymphatic No adneopathy. No adenopathy. No adenopathy. Musculoskeletal Adexa without tenderness or enlargement.. Digits and  nails w/o clubbing, cyanosis, infection, petechiae, ischemia, or inflammatory conditions.Marland Kitchen Psychiatric Judgement and insight Intact.. No evidence of depression, anxiety, or agitation.Marland Kitchen CARMAN, AUXIER. ([id number]) General Notes: the posterior aspects of both legs continue to have subcutaneous debris and are sharply removed this with a #3 curet and bleeding controlled with pressure. We will use Santyl on these wounds. The rest of the wounds will be covered with silver alginate Integumentary (Hair, Skin) No suspicious lesions. No crepitus or fluctuance. No peri-wound warmth or erythema. No masses.. Wound #1 status is Open. Original cause of wound was Gradually Appeared. The wound is located on the Left,Circumferential Lower Leg. The wound measures 6cm length x 12cm width x 0.2cm depth; 56.549cm^2 area and 11.31cm^3 volume. The wound is limited to skin breakdown. There is no tunneling or undermining noted. There is a large amount of purulent drainage noted. The wound margin is flat and intact. There is no granulation within the wound bed. There is a large (67-100%) amount of necrotic tissue within the wound bed including Adherent Slough. The periwound skin appearance exhibited: Localized Edema, Scarring, Maceration, Moist, Hemosiderin Staining. The periwound skin appearance did not exhibit: Callus, Crepitus, Excoriation, Fluctuance, Friable, Induration, Rash, Dry/Scaly, Atrophie Blanche, Cyanosis, Ecchymosis, Mottled, Pallor, Rubor, Erythema. Periwound temperature was noted as No Abnormality. The periwound has tenderness on palpation. Wound #2 status is Open. Original cause of wound was Gradually Appeared. The wound is located on the Right,Circumferential Lower Leg. The wound measures 5cm length x 9.5cm width x 0.2cm depth; 37.306cm^2 area and 7.461cm^3 volume. The wound is limited to skin breakdown. There is no tunneling or undermining noted. There is a large amount of serosanguineous  drainage noted. The wound margin is indistinct and nonvisible. There is no granulation within the wound bed. There is a large (67-100%) amount of necrotic tissue within the wound bed including Adherent Slough. The periwound skin appearance exhibited: Localized Edema, Scarring, Maceration, Moist, Hemosiderin Staining. The periwound skin appearance did not exhibit: Callus, Crepitus, Excoriation, Fluctuance, Friable, Induration, Rash, Dry/Scaly, Atrophie Blanche, Cyanosis, Ecchymosis, Mottled, Pallor, Rubor, Erythema. Periwound temperature was noted as No Abnormality. The periwound has tenderness on palpation. 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 ([id number]) The patient is looking better overall and her drainage has come down significantly and we can reduce her dressing changes to twice a week. We will continue to use silver alginate on most of the wounds. The wounds on the posterior aspect of both legs have significant amount of subcutaneous debris and we will use Santyl ointment for these. Rest of the wounds washed out well with moist saline gauze and we can use silver alginate on them. have a Profore lite applied bilaterally. We will see her back next week. Procedures Wound #1 Wound #1 is a Lymphedema located on  the Left,Circumferential Lower Leg . There was a Skin/Subcutaneous Tissue Debridement ([id number]) debridement with total area of 16 sq cm performed by Christin Fudge, MD. with the following instrument(s): Curette to remove Viable and Non-Viable tissue/material including Exudate, Fibrin/Slough, and Subcutaneous after achieving  pain control using Lidocaine 4% Topical Solution. A time out was conducted at 10:29, prior to the start of the procedure. A Minimum amount of bleeding was controlled with Pressure. The procedure was tolerated well with a pain level of 0 throughout and a pain level of 0 following the procedure. Post Debridement Measurements: 6cm length x 12cm width x 0.2cm depth; 11.31cm^3 volume. Character of Wound/Ulcer Post Debridement is stable. Severity of Tissue Post Debridement is: Fat layer exposed. Post procedure Diagnosis Wound #1: Same as Pre-Procedure Wound #2 Wound #2 is a Lymphedema located on the Right,Circumferential Lower Leg . There was a Skin/Subcutaneous Tissue Debridement ([id number]) debridement with total area of 12 sq cm performed by Christin Fudge, MD. with the following instrument(s): Curette to remove Viable and Non-Viable tissue/material including Exudate, Fibrin/Slough, and Subcutaneous after achieving pain control using Lidocaine 4% Topical Solution. A time out was conducted at 10:29, prior to the start of the procedure. A Minimum amount of bleeding was controlled with Pressure. The procedure was tolerated well with a pain level of 0 throughout and a pain level of 0 following the procedure. Post Debridement Measurements: 5cm length x 9.5cm width x 0.2cm depth; 7.461cm^3 volume. Character of Wound/Ulcer Post Debridement is stable. Severity of Tissue Post Debridement is: Fat layer exposed. Post procedure Diagnosis Wound #2: Same as Pre-Procedure Plan Wound Cleansing: Wound #1 Left,Circumferential Lower Leg: Cleanse wound with mild soap and water May Shower, gently pat wound dry prior to applying new dressing. May shower with protection. Wound #2 Right,Circumferential Lower Leg: Cleanse wound with mild soap and water [name], [name] ([id number]) May Shower, gently pat wound dry prior to applying new dressing. May shower with protection. Anesthetic: Wound #1  Left,Circumferential Lower Leg: Topical Lidocaine 4% cream applied to wound bed prior to debridement Wound #2 Right,Circumferential Lower Leg: Topical Lidocaine 4% cream applied to wound bed prior to debridement Skin Barriers/Peri-Wound Care: Wound #1 Left,Circumferential Lower Leg: Other: - Lotrisone cream in folds at knees and on reddened areas on legs (Nystatin used in clinic) Wound #2 Right,Circumferential Lower Leg: Other: - Lotrisone cream in folds at knees and on reddened areas on legs (Nystatin used in clinic) Primary Wound Dressing: Wound #1 Left,Circumferential Lower Leg: Santyl Ointment - santyl on the posterior wound only Aquacel Ag - aquacel ag on all other open areas Wound #2 Right,Circumferential Lower Leg: Santyl Ointment - santyl on the posterior wound only Aquacel Ag - aquacel ag on all other open areas Secondary Dressing: Wound #1 Left,Circumferential Lower Leg: ABD pad XtraSorb - if needed Wound #2 Right,Circumferential Lower Leg: ABD pad XtraSorb - if needed Dressing Change Frequency: Wound #1 Left,Circumferential Lower Leg: Other: - Tuesday and Friday - twice weekly - Fridays at [place] Wound #2 Right,Circumferential Lower Leg: Other: - Tuesday and Friday - twice weekly - Fridays at [place] Follow-up Appointments: Wound #1 Left,Circumferential Lower Leg: Return Appointment in 1 week. Wound #2 Right,Circumferential Lower Leg: Return Appointment in 1 week. Edema Control: Wound #1 Left,Circumferential Lower Leg: 3 Layer Compression System - Bilateral - May anchor top of wrap with dome paste, but only wrap around leg one time. Elevate legs to the level of the heart and pump ankles as often as possible  Wound #2 Right,Circumferential Lower Leg: 3 Layer Compression System - Bilateral - May anchor top of wrap with dome paste, but only wrap around leg one time. Elevate legs to the level of the heart and pump ankles as often as  possible Home Health: Wound #1 Left,Circumferential Lower Leg: Continue Home Health Visits - [place] Nurse may visit PRN to address patient s wound care needs. FACE TO FACE ENCOUNTER: MEDICARE and MEDICAID PATIENTS: I certify that this patient is under my care and that I had a face-to-face encounter that meets the physician face-to-face encounter MAXINE, FREDMAN ([id number]) requirements with this patient on this date. The encounter with the patient was in whole or in part for the following MEDICAL CONDITION: (primary reason for [place]) MEDICAL NECESSITY: I certify, that based on my findings, NURSING services are a medically necessary home health service. HOME BOUND STATUS: I certify that my clinical findings support that this patient is homebound (i.e., Due to illness or injury, pt requires aid of supportive devices such as crutches, cane, wheelchairs, walkers, the use of special transportation or the assistance of another person to leave their place of residence. There is a normal inability to leave the home and doing so requires considerable and taxing effort. Other absences are for medical reasons / religious services and are infrequent or of short duration when for other reasons). Please direct any NON-WOUND related issues/requests for orders to patient's Primary Care Physician Wound #2 Right,Circumferential Lower Leg: [place] Nurse may visit PRN to address patient s wound care needs. FACE TO FACE ENCOUNTER: MEDICARE and MEDICAID PATIENTS: I certify that this patient is under my care and that I had a face-to-face encounter that meets the physician face-to-face encounter requirements with this patient on this date. The encounter with the patient was in whole or in part for the following MEDICAL CONDITION: (primary reason for [place]) MEDICAL NECESSITY: I certify, that based on my findings, NURSING services are a medically  necessary home health service. HOME BOUND STATUS: I certify that my clinical findings support that this patient is homebound (i.e., Due to illness or injury, pt requires aid of supportive devices such as crutches, cane, wheelchairs, walkers, the use of special transportation or the assistance of another person to leave their place of residence. There is a normal inability to leave the home and doing so requires considerable and taxing effort. Other absences are for medical reasons / religious services and are infrequent or of short duration when for other reasons). Please direct any NON-WOUND related issues/requests for orders to patient's Primary Care Physician The patient is looking better overall and her drainage has come down significantly and we can reduce her dressing changes to twice a week. We will continue to use silver alginate on most of the wounds. The wounds on the posterior aspect of both legs have significant amount of subcutaneous debris and we will use Santyl ointment for these. Rest of the wounds washed out well with moist saline gauze and we can use silver alginate on them. have a Profore lite applied bilaterally. We will see her back next week. Electronic Signature(s) Signed: 09/06/2016 10:35:03 AM By: Christin Fudge MD, FACS Entered By: Christin Fudge on 09/06/2016 10:35:02 [name] ([id number]) -------------------------------------------------------------------------------- SuperBill Details Patient Name: TINLEIGH, WHITMIRE. Date of Service: 09/06/2016 Medical Record Number: [id number] Patient Account Number: [id number] Date of Birth/Sex: 02-Sep-1951 (65 y.o. 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 E66.01 Morbid (severe) obesity due to excess calories B37.2 Candidiasis of skin and nail Facility Procedures CPT4 Code: [id number] Description: 75051 - DEB SUBQ TISSUE 20 SQ CM/< ICD-10 Description Diagnosis E11.622 Type 2 diabetes mellitus with other skin ulcer I89.0 Lymphedema, not elsewhere classified L97.222 Non-pressure chronic ulcer of left calf with fat l 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 Modifier: ayer exposed layer exposed Quantity: 1 CPT4 Code: [id number] Description: 89842 - DEB SUBQ TISS EA ADDL 20CM ICD-10 Description Diagnosis E11.622 Type 2 diabetes mellitus with other skin ulcer I89.0 Lymphedema, not elsewhere classified L97.222 Non-pressure chronic ulcer of left calf with fat l 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 Modifier: ayer exposed layer exposed Quantity: 1 Physician Procedures CPT4 Code: [id number] Slaubaugh, LI Description: 11042 - WC PHYS SUBQ TISS 20 SQ CM ICD-10 Description Diagnosis E11.622 Type 2 diabetes mellitus with other skin ulcer I89.0 Lymphedema, not elsewhere classified NDA M. ([id number]) Modifier: Quantity: 1 Electronic Signature(s) Signed: 09/06/2016 10:35:22 AM By: Christin Fudge MD, FACS Entered By: Christin Fudge on 09/06/2016 10:35:22

## 2016-09-11 ENCOUNTER — Encounter (INDEPENDENT_AMBULATORY_CARE_PROVIDER_SITE_OTHER): Payer: Self-pay | Admitting: Vascular Surgery

## 2016-09-13 ENCOUNTER — Encounter: Payer: 59 | Admitting: Surgery

## 2016-09-13 DIAGNOSIS — E11622 Type 2 diabetes mellitus with other skin ulcer: Secondary | ICD-10-CM | POA: Diagnosis not present

## 2016-09-13 DIAGNOSIS — L97811 Non-pressure chronic ulcer of other part of right lower leg limited to breakdown of skin: Secondary | ICD-10-CM | POA: Diagnosis not present

## 2016-09-13 DIAGNOSIS — L97821 Non-pressure chronic ulcer of other part of left lower leg limited to breakdown of skin: Secondary | ICD-10-CM | POA: Diagnosis not present

## 2016-09-13 DIAGNOSIS — I890 Lymphedema, not elsewhere classified: Secondary | ICD-10-CM | POA: Diagnosis not present

## 2016-09-14 NOTE — Progress Notes (Signed)
MAALLE, STARRETT ([id number]) Visit Report for 09/13/2016 Chief Complaint Document Details Patient Name: Brooke Hopkins, Brooke Hopkins. Date of Service: 09/13/2016 9:30 AM Medical Record Number: [id number] Patient Account Number: [id number] Date of Birth/Sex: 09-13-1951 (65 y.o. Female) Treating RN: Ahmed Prima Primary Care Physician: FITZGERALD, DAVID Other Clinician: Referring Physician: FITZGERALD, DAVID Treating Physician/Extender: Frann Rider in Treatment: 12 Information Obtained from: Patient Chief Complaint Patients presents for treatment of an open diabetic ulcer and significant lymphedema which she's had for about two years Electronic Signature(s) Signed: 09/13/2016 10:17:17 AM By: Christin Fudge MD, FACS Entered By: Christin Fudge on 09/13/2016 10:17:17 [name] ([id number]) -------------------------------------------------------------------------------- HPI Details Patient Name: Brooke Hopkins. Date of Service: 09/13/2016 9:30 AM Medical Record Number: [id number] Patient Account Number: [id number] Date of Birth/Sex: Feb 07, 1951 (65 y.o. Female) Treating RN: Ahmed Prima Primary Care Physician: FITZGERALD, DAVID Other Clinician: Referring Physician: FITZGERALD, DAVID Treating Physician/Extender: Frann Rider in Treatment: 12 History of Present Illness Location: massive swelling of both lower extremities and ulceration both lower extremities Quality: Patient reports experiencing a dull pain to affected area(s). Severity: Patient states wound are getting worse. Duration: Patient has had the wound for >2 years prior to seeking treatment at the wound center Timing: Pain in wound is constant (hurts all the time) Context: The wound appeared gradually over time Modifying Factors: Other treatment(s) tried include:she has a lymphedema pump but uses it seldom and she's had several course of antibiotics Associated Signs and Symptoms: Patient reports having  difficulty standing for long periods. HPI Description: 65 year old patient seen by Dr. Ola Spurr of infectious disease who has been following up for left lower extremity cellulitis and ulcer with recurrent bilateral lower extremity problems for several months. Recently she had a large right lower extremity bullae which opened out and has been ulcerated. She has seen the vascular group and has been getting Unna's wraps and has a lymphedema pump used in the past. Her prior cultures were positive for Pseudomonas, Proteus and was treated with amoxicillin. He has also been treated with 2 weeks course of ciprofloxacin and amoxicillin.. Increase of Lasix dose helped with the edema and echo showed no systolic CHF but may be diastolic problems. Past medical history significant for diabetes mellitus type 2, venous stasis ulcer, obesity, diabetic peripheral neuropathy, status post back surgery, cholecystectomy, hysterectomy, arthroscopy of the knee. He is a former smoker and quit smoking in 1984. The patient has seen Dr. Delana Meyer who did not recommend any arterial or venous duplex studies and has been using Unna's wraps and also recommended a lymphedema pump. she has been very noncompliant with using these. 06/28/2016 -- the patient is still on antibiotics as prescribed by Dr. Ola Spurr and he is asked her to take it for 3 weeks. The patient also says she has a lot of redness and pain in the folds of her thigh and lower extremity and this is very painful. 07/26/2016 -- the patient is off antibiotics and has been getting dressing changes 3 times a week. 08/09/2016 -- is been on Cipro and is taking potassium supplements along with her Lasix and is going to be seeing Dr. Ola Spurr for a consult return visit only in November 2017. 08/23/2016 -- she was admitted to the hospital last week and I have reviewed these reports in detail. She was seen by Dr. Ola Spurr who noted a Pseudomonas infection which was  resistant to ciprofloxacin and discharged her on Ceftazidime 1 g IV every 12 hourly anyone days. The antibiotic was to be stopped  on September 11 and he would see her back in the clinic. 08/30/2016 -- had a communication from Dr. Ola Spurr that he would extend her antibiotics by a week if she SHINA, WASS. ([id number]) continued to look like cellulitis was persisting. 09/13/2016 -- the PICC line is out and antibiotics have stopped. Electronic Signature(s) Signed: 09/13/2016 10:18:07 AM By: Christin Fudge MD, FACS Entered By: Christin Fudge on 09/13/2016 10:18:06 [name] ([id number]) -------------------------------------------------------------------------------- Physical Exam Details Patient Name: Brooke Hopkins. Date of Service: 09/13/2016 9:30 AM Medical Record Number: [id number] Patient Account Number: [id number] Date of Birth/Sex: [birth year] (65 y.o. Female) Treating RN: Ahmed Prima Primary Care Physician: FITZGERALD, DAVID Other Clinician: Referring Physician: FITZGERALD, DAVID Treating Physician/Extender: Frann Rider in Treatment: 12 Constitutional . Pulse regular. Respirations normal and unlabored. Afebrile. . Eyes Nonicteric. Reactive to light. Ears, Nose, Mouth, and Throat Lips, teeth, and gums WNL.Marland Kitchen Moist mucosa without lesions. Neck supple and nontender. No palpable supraclavicular or cervical adenopathy. Normal sized without goiter. Respiratory WNL. No retractions.. Breath sounds WNL, No rubs, rales, rhonchi, or wheeze.. Cardiovascular Heart rhythm and rate regular, no murmur or gallop.. Pedal Pulses WNL. No clubbing, cyanosis or edema. Lymphatic No adneopathy. No adenopathy. No adenopathy. Musculoskeletal Adexa without tenderness or enlargement.. Digits and nails w/o clubbing, cyanosis, infection, petechiae, ischemia, or inflammatory conditions.. Integumentary (Hair, Skin) No suspicious lesions. No crepitus or fluctuance. No peri-wound  warmth or erythema. No masses.Marland Kitchen Psychiatric Judgement and insight Intact.. No evidence of depression, anxiety, or agitation.. Notes the lymphedema is increase possibly because of inadequate compression and on reviewing the wounds we will continue with Santyl ointment on the posterior wounds and silver alginate on the rest. No sharp debridement was done today. Electronic Signature(s) Signed: 09/13/2016 10:18:45 AM By: Christin Fudge MD, FACS Entered By: Christin Fudge on 09/13/2016 10:18:44 [name] ([id number]) -------------------------------------------------------------------------------- Physician Orders Details Patient Name: ZINIA, INNOCENT. Date of Service: 09/13/2016 9:30 AM Medical Record Number: [id number] Patient Account Number: [id number] Date of Birth/Sex: [birth year] (64 y.o. Female) Treating RN: Montey Hora Primary Care Physician: FITZGERALD, DAVID Other Clinician: Referring Physician: FITZGERALD, DAVID Treating Physician/Extender: Frann Rider in Treatment: 12 Verbal / Phone Orders: Yes Clinician: Montey Hora Read Back and Verified: Yes Diagnosis Coding Wound Cleansing Wound #1 Left,Circumferential Lower Leg o Cleanse wound with mild soap and water o May Shower, gently pat wound dry prior to applying new dressing. o May shower with protection. Wound #2 Right,Circumferential Lower Leg o Cleanse wound with mild soap and water o May Shower, gently pat wound dry prior to applying new dressing. o May shower with protection. Anesthetic Wound #1 Left,Circumferential Lower Leg o Topical Lidocaine 4% cream applied to wound bed prior to debridement Wound #2 Right,Circumferential Lower Leg o Topical Lidocaine 4% cream applied to wound bed prior to debridement Skin Barriers/Peri-Wound Care Wound #1 Left,Circumferential Lower Leg o Other: - Lotrisone cream in folds at knees and on reddened areas on legs (Nystatin used in clinic) Wound  #2 Right,Circumferential Lower Leg o Other: - Lotrisone cream in folds at knees and on reddened areas on legs (Nystatin used in clinic) Primary Wound Dressing Wound #1 Left,Circumferential Lower Leg o Santyl Ointment - santyl on the posterior wounds only o Aquacel Ag - aquacel ag on all other open areas Wound #2 Right,Circumferential Lower Leg o Santyl Ointment - santyl on the posterior wounds only o Aquacel Ag - aquacel ag on all other open areas Secondary Dressing Wound #1 Left,Circumferential Lower Leg THRESSA, SHIFFER. ([id number]) o  ABD pad o XtraSorb - if needed Wound #2 Right,Circumferential Lower Leg o ABD pad o XtraSorb - if needed Dressing Change Frequency Wound #1 Left,Circumferential Lower Leg o Other: - Tuesday and Friday - twice weekly - Fridays at [place] Wound #2 Right,Circumferential Lower Leg o Other: - Tuesday and Friday - twice weekly - Fridays at [place] Follow-up Appointments Wound #1 Left,Circumferential Lower Leg o Return Appointment in 1 week. Wound #2 Right,Circumferential Lower Leg o Return Appointment in 1 week. Edema Control Wound #1 Left,Circumferential Lower Leg o 3 Layer Compression System - Bilateral - May anchor top of wrap with dome paste, but only wrap around leg one time. o Elevate legs to the level of the heart and pump ankles as often as possible Wound #2 Right,Circumferential Lower Leg o 3 Layer Compression System - Bilateral - May anchor top of wrap with dome paste, but only wrap around leg one time. o Elevate legs to the level of the heart and pump ankles as often as possible Home Health Wound #1 Left,Circumferential Lower Leg o [place] Visits - [place] Nurse may visit PRN to address patientos wound care needs. o FACE TO FACE ENCOUNTER: MEDICARE and MEDICAID PATIENTS: I certify that this patient is under my care and that I had a  face-to-face encounter that meets the physician face-to-face encounter requirements with this patient on this date. The encounter with the patient was in whole or in part for the following MEDICAL CONDITION: (primary reason for [place]) MEDICAL NECESSITY: I certify, that based on my findings, NURSING services are a medically necessary home health service. HOME BOUND STATUS: I certify that my clinical findings support that this patient is homebound (i.e., Due to illness or injury, pt requires aid of supportive devices such as crutches, cane, wheelchairs, walkers, the use of special transportation or the assistance of another person to leave their place of residence. There is a normal inability to leave the home and doing so requires considerable and taxing effort. Other absences are for medical reasons / religious services and are infrequent or of short duration when for other reasons). ZIONNA, HOMEWOOD ([id number]) o Please direct any NON-WOUND related issues/requests for orders to patient's Primary Care Physician Wound #2 Right,Circumferential Lower Leg o [place] Visits - [place] Nurse may visit PRN to address patientos wound care needs. o FACE TO FACE ENCOUNTER: MEDICARE and MEDICAID PATIENTS: I certify that this patient is under my care and that I had a face-to-face encounter that meets the physician face-to-face encounter requirements with this patient on this date. The encounter with the patient was in whole or in part for the following MEDICAL CONDITION: (primary reason for [place]) MEDICAL NECESSITY: I certify, that based on my findings, NURSING services are a medically necessary home health service. HOME BOUND STATUS: I certify that my clinical findings support that this patient is homebound (i.e., Due to illness or injury, pt requires aid of supportive devices such as crutches, cane, wheelchairs, walkers, the use of  special transportation or the assistance of another person to leave their place of residence. There is a normal inability to leave the home and doing so requires considerable and taxing effort. Other absences are for medical reasons / religious services and are infrequent or of short duration when for other reasons). o Please direct any NON-WOUND related issues/requests for orders to patient's Primary Care Physician Electronic Signature(s) Signed: 09/13/2016 12:32:55 PM By:  Christin Fudge MD, FACS Signed: 09/13/2016 5:02:11 PM By: Montey Hora Entered By: Montey Hora on 09/13/2016 10:15:56 [name] ([id number]) -------------------------------------------------------------------------------- Problem List Details Patient Name: LTANYA, BAYLEY. Date of Service: 09/13/2016 9:30 AM Medical Record Number: [id number] Patient Account Number: [id number] Date of Birth/Sex: 03-Oct-1951 (64 y.o. 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 Date of Service: 09/13/2016 9:30 AM Medical Record Number: [id number] Patient Account Number: [id number] Date of Birth/Sex: 1951/09/20 (64 y.o. Female) Treating RN: Ahmed Prima Primary Care Physician: FITZGERALD, DAVID Other Clinician: Referring Physician: FITZGERALD, DAVID Treating Physician/Extender: Frann Rider in Treatment: 12 Subjective Chief Complaint Information obtained from Patient Patients presents for treatment of an open diabetic ulcer and significant lymphedema which she's had for about two years History of Present Illness (HPI) The following HPI elements were documented for the patient's wound: Location: massive swelling of both lower extremities and ulceration both lower extremities Quality: Patient reports experiencing a dull pain to affected area(s). Severity: Patient states wound are getting worse. Duration: Patient has had the wound for >2 years prior to seeking treatment at the wound center Timing: Pain in wound is constant (hurts all the time) Context: The wound appeared gradually over time Modifying Factors: Other treatment(s) tried include:she has a lymphedema pump but uses it seldom and she's had several course of antibiotics Associated Signs and Symptoms: Patient reports having difficulty standing for long periods. 65 year old patient seen by Dr. Ola Spurr of infectious disease who has been following up for left lower extremity cellulitis and ulcer with recurrent bilateral lower extremity problems for several months. Recently she had a large right lower extremity bullae which opened out and has been ulcerated. She has seen the vascular group and has been getting Unna's wraps and has a lymphedema pump used in the past. Her prior cultures were positive for Pseudomonas, Proteus and was treated with amoxicillin. He has also been treated with 2 weeks course of ciprofloxacin and amoxicillin.. Increase of Lasix dose  helped with the edema and echo showed no systolic CHF but may be diastolic problems. Past medical history significant for diabetes mellitus type 2, venous stasis ulcer, obesity, diabetic peripheral neuropathy, status post back surgery, cholecystectomy, hysterectomy, arthroscopy of the knee. He is a former smoker and quit smoking in 1984. The patient has seen Dr. Delana Meyer who did not recommend any arterial or venous duplex studies and has been using Unna's wraps and also recommended a lymphedema pump. she has been very noncompliant with using these. 06/28/2016 -- the patient is still on antibiotics as prescribed by Dr. Ola Spurr and he is asked her to take it for 3 weeks. The patient also says she has a lot of redness and pain in the folds of her thigh and lower extremity and this is very painful. 07/26/2016 --  the patient is off antibiotics and has been getting dressing changes 3 times a week. 08/09/2016 -- is been on Cipro and is taking potassium supplements along with her Lasix and is going to be ANILAH, HUCK. ([id number]) seeing Dr. Ola Spurr for a consult return visit only in November 2017. 08/23/2016 -- she was admitted to the hospital last week and I have reviewed these reports in detail. She was seen by Dr. Ola Spurr who noted a Pseudomonas infection which was resistant to ciprofloxacin and discharged her on Ceftazidime 1 g IV every 12 hourly anyone days. The antibiotic was to be stopped on September 11 and he would see her back in the clinic. 08/30/2016 -- had a communication from Dr. Ola Spurr that he would extend her antibiotics by a week if she continued to look like cellulitis was persisting. 09/13/2016 -- the PICC line is out and antibiotics have stopped. Objective Constitutional Pulse regular. Respirations normal and unlabored. Afebrile. Vitals Time Taken: 9:49 AM, Height: 67 in, Weight: 300 lbs, BMI: 47, Temperature: 97.8 F, Pulse: 63 bpm, Respiratory Rate: 20  breaths/min, Blood Pressure: 186/48 mmHg. General Notes: 166/80 manually Eyes Nonicteric. Reactive to light. Ears, Nose, Mouth, and Throat Lips, teeth, and gums WNL.Marland Kitchen Moist mucosa without lesions. Neck supple and nontender. No palpable supraclavicular or cervical adenopathy. Normal sized without goiter. Respiratory WNL. No retractions.. Breath sounds WNL, No rubs, rales, rhonchi, or wheeze.. Cardiovascular Heart rhythm and rate regular, no murmur or gallop.. Pedal Pulses WNL. No clubbing, cyanosis or edema. Lymphatic No adneopathy. No adenopathy. No adenopathy. Musculoskeletal Adexa without tenderness or enlargement.. Digits and nails w/o clubbing, cyanosis, infection, petechiae, ischemia, or inflammatory conditions.Marland Kitchen Psychiatric Judgement and insight Intact.. No evidence of depression, anxiety, or agitation.Marland Kitchen KALLIE, DEPOLO. ([id number]) General Notes: the lymphedema is increase possibly because of inadequate compression and on reviewing the wounds we will continue with Santyl ointment on the posterior wounds and silver alginate on the rest. No sharp debridement was done today. Integumentary (Hair, Skin) No suspicious lesions. No crepitus or fluctuance. No peri-wound warmth or erythema. No masses.. Wound #1 status is Open. Original cause of wound was Gradually Appeared. The wound is located on the Left,Circumferential Lower Leg. The wound measures 6cm length x 12cm width x 0.2cm depth; 56.549cm^2 area and 11.31cm^3 volume. The wound is limited to skin breakdown. There is no tunneling or undermining noted. There is a large amount of purulent drainage noted. The wound margin is flat and intact. There is no granulation within the wound bed. There is a large (67-100%) amount of necrotic tissue within the wound bed including Adherent Slough. The periwound skin appearance exhibited: Localized Edema, Scarring, Maceration, Moist, Hemosiderin Staining. The periwound skin appearance did not  exhibit: Callus, Crepitus, Excoriation, Fluctuance, Friable, Induration, Rash, Dry/Scaly, Atrophie Blanche, Cyanosis, Ecchymosis, Mottled, Pallor, Rubor, Erythema. Periwound temperature was noted as No Abnormality. The periwound has tenderness on palpation. Wound #2 status is Open. Original cause of wound was Gradually Appeared. The wound is located on the Right,Circumferential Lower Leg. The wound measures 5cm length x 9.5cm width x 0.2cm depth; 37.306cm^2 area and 7.461cm^3 volume. The wound is limited to skin breakdown. There is no tunneling or undermining noted. There is a large amount of serosanguineous drainage noted. The wound margin is indistinct and nonvisible. There is no granulation within the wound bed. There is a large (67-100%) amount of necrotic tissue within the wound bed including Adherent Slough. The periwound skin appearance exhibited: Localized Edema, Scarring, Maceration, Moist, Hemosiderin Staining. The periwound skin  appearance did not exhibit: Callus, Crepitus, Excoriation, Fluctuance, Friable, Induration, Rash, Dry/Scaly, Atrophie Blanche, Cyanosis, Ecchymosis, Mottled, Pallor, Rubor, Erythema. Periwound temperature was noted as No Abnormality. The periwound has tenderness on palpation. 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 May shower with protection. Wound #2 Right,Circumferential Lower Leg: Cleanse wound with mild soap and  water May Shower, gently pat wound dry prior to applying new dressing. May shower with protection. Anesthetic: Wound #1 Left,Circumferential Lower Leg: Topical Lidocaine 4% cream applied to wound bed prior to debridement Wound #2 Right,Circumferential Lower Leg: Topical Lidocaine 4% cream applied to wound bed prior to debridement Skin Barriers/Peri-Wound Care: Wound #1 Left,Circumferential Lower Leg: Other: - Lotrisone cream in folds at knees and on reddened areas on legs (Nystatin used in clinic) Wound #2 Right,Circumferential Lower Leg: Other: - Lotrisone cream in folds at knees and on reddened areas on legs (Nystatin used in clinic) Primary Wound Dressing: Wound #1 Left,Circumferential Lower Leg: Santyl Ointment - santyl on the posterior wounds only Aquacel Ag - aquacel ag on all other open areas Wound #2 Right,Circumferential Lower Leg: Santyl Ointment - santyl on the posterior wounds only Aquacel Ag - aquacel ag on all other open areas Secondary Dressing: Wound #1 Left,Circumferential Lower Leg: ABD pad XtraSorb - if needed Wound #2 Right,Circumferential Lower Leg: ABD pad XtraSorb - if needed Dressing Change Frequency: Wound #1 Left,Circumferential Lower Leg: Other: - Tuesday and Friday - twice weekly - Fridays at [place] Wound #2 Right,Circumferential Lower Leg: Other: - Tuesday and Friday - twice weekly - Fridays at [place] Follow-up Appointments: Wound #1 Left,Circumferential Lower Leg: Return Appointment in 1 week. Wound #2 Right,Circumferential Lower Leg: Return Appointment in 1 week. Edema Control: Wound #1 Left,Circumferential Lower Leg: 3 Layer Compression System - Bilateral - May anchor top of wrap with dome paste, but only wrap around leg one time. Elevate legs to the level of the heart and pump ankles as often as possible ARMIDA, VICKROY. ([id number]) Wound #2 Right,Circumferential Lower Leg: 3 Layer Compression System -  Bilateral - May anchor top of wrap with dome paste, but only wrap around leg one time. Elevate legs to the level of the heart and pump ankles as often as possible Home Health: Wound #1 Left,Circumferential Lower Leg: Continue Home Health Visits - [place] Nurse may visit PRN to address patient s wound care needs. FACE TO FACE ENCOUNTER: MEDICARE and MEDICAID PATIENTS: I certify that this patient is under my care and that I had a face-to-face encounter that meets the physician face-to-face encounter requirements with this patient on this date. The encounter with the patient was in whole or in part for the following MEDICAL CONDITION: (primary reason for [place]) MEDICAL NECESSITY: I certify, that based on my findings, NURSING services are a medically necessary home health service. HOME BOUND STATUS: I certify that my clinical findings support that this patient is homebound (i.e., Due to illness or injury, pt requires aid of supportive devices such as crutches,  cane, wheelchairs, walkers, the use of special transportation or the assistance of another person to leave their place of residence. There is a normal inability to leave the home and doing so requires considerable and taxing effort. Other absences are for medical reasons / religious services and are infrequent or of short duration when for other reasons). Please direct any NON-WOUND related issues/requests for orders to patient's Primary Care Physician Wound #2 Right,Circumferential Lower Leg: [place] Visits - [place] Nurse may visit PRN to address patient s wound care needs. FACE TO FACE ENCOUNTER: MEDICARE and MEDICAID PATIENTS: I certify that this patient is under my care and that I had a face-to-face encounter that meets the physician face-to-face encounter requirements with this patient on this date. The encounter with the patient was in whole or in part for the following MEDICAL  CONDITION: (primary reason for [place]) MEDICAL NECESSITY: I certify, that based on my findings, NURSING services are a medically necessary home health service. HOME BOUND STATUS: I certify that my clinical findings support that this patient is homebound (i.e., Due to illness or injury, pt requires aid of supportive devices such as crutches, cane, wheelchairs, walkers, the use of special transportation or the assistance of another person to leave their place of residence. There is a normal inability to leave the home and doing so requires considerable and taxing effort. Other absences are for medical reasons / religious services and are infrequent or of short duration when for other reasons). Please direct any NON-WOUND related issues/requests for orders to patient's Primary Care Physician We will continue to use silver alginate on most of the wounds. The wounds on the posterior aspect of both legs have significant amount of subcutaneous debris and we will use Santyl ointment for these. Rest of the wounds washed out well with moist saline gauze and we can use silver alginate on them. have a Profore lite applied bilaterally. We will see her back next week. Electronic Signature(s) Signed: 09/13/2016 10:19:30 AM By: Christin Fudge MD, FACS Entered By: Christin Fudge on 09/13/2016 10:19:30 OLIVE, ZMUDA ([id number], FREDLUND ([id number]) -------------------------------------------------------------------------------- SuperBill Details Patient Name: AYDAN, [place]OENIX. Date of Service: 09/13/2016 Medical Record Number: [id number] Patient Account Number: [id number] Date of Birth/Sex: 06-21-1951 (64 y.o. 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

## 2016-09-14 NOTE — Progress Notes (Signed)
Brooke Hopkins, Brooke Hopkins ([id number]) Visit Report for 09/13/2016 Arrival Information Details Patient Name: Brooke Hopkins, Brooke Hopkins. Date of Service: 09/13/2016 9:30 AM Medical Record Number: [id number] Patient Account Number: [id number] Date of Birth/Sex: [birth year] (64 y.o. Female) Treating RN: Ahmed Prima Primary Care Physician: FITZGERALD, DAVID Other Clinician: Referring Physician: FITZGERALD, DAVID Treating Physician/Extender: Frann Rider in Treatment: 12 Visit Information History Since Last Visit All ordered tests and consults were completed: No Patient Arrived: Wheel Chair Added or deleted any medications: No Arrival Time: 09:46 Any new allergies or adverse reactions: No Accompanied By: son Had a fall or experienced change in No Transfer Assistance: [employer] activities of daily living that may affect Patient Lift risk of falls: Patient Identification Verified: Yes Signs or symptoms of abuse/neglect since last No Secondary Verification Process Yes visito Completed: Hospitalized since last visit: No Patient Requires Transmission- No Pain Present Now: No Based Precautions: Patient Has Alerts: No Electronic Signature(s) Signed: 09/13/2016 4:43:58 PM By: Alric Quan Entered By: Alric Quan on 09/13/2016 09:47:02 Brooke Hopkins ([id number]) -------------------------------------------------------------------------------- Encounter Discharge Information Details Patient Name: Brooke Hopkins, Brooke Hopkins. Date of Service: 09/13/2016 9:30 AM Medical Record Number: [id number] Patient Account Number: [id number] Date of Birth/Sex: 04-12-1951 (64 y.o. Female) Treating RN: Ahmed Prima Primary Care Physician: FITZGERALD, DAVID Other Clinician: Referring Physician: FITZGERALD, DAVID Treating Physician/Extender: Frann Rider in Treatment: 12 Encounter Discharge Information Items Discharge Pain Level: 0 Discharge Condition: Stable Ambulatory Status:  Wheelchair Discharge Destination: Home Transportation: Private Auto Accompanied By: son Schedule Follow-up Appointment: Yes Medication Reconciliation completed and provided to Patient/Care Yes Sher Shampine: Provided on Clinical Summary of Care: 09/13/2016 Form Type Recipient Paper Patient LB Electronic Signature(s) Signed: 09/13/2016 10:35:35 AM By: Ruthine Dose Entered By: Ruthine Dose on 09/13/2016 10:35:35 Brooke Hopkins ([id number]) -------------------------------------------------------------------------------- Lower Extremity Assessment Details Patient Name: Brooke Hopkins, Brooke Hopkins. Date of Service: 09/13/2016 9:30 AM Medical Record Number: [id number] Patient Account Number: [id number] Date of Birth/Sex: 1951-03-16 (64 y.o. Female) Treating RN: Ahmed Prima Primary Care Physician: FITZGERALD, DAVID Other Clinician: Referring Physician: FITZGERALD, DAVID Treating Physician/Extender: Frann Rider in Treatment: 12 Edema Assessment Assessed: [Left: No] [Right: No] E[Left: dema] [Right: :] Calf Left: Right: Point of Measurement: 32 cm From Medial Instep 55 cm 54.8 cm Ankle Left: Right: Point of Measurement: 11 cm From Medial Instep 35 cm 33 cm Vascular Assessment Pulses: Posterior Tibial Dorsalis Pedis Palpable: [Left:Yes] [Right:Yes] Extremity colors, hair growth, and conditions: Extremity Color: [Left:Red] [Right:Red] Temperature of Extremity: [Left:Warm] [Right:Warm] Capillary Refill: [Left:< 3 seconds] [Right:< 3 seconds] Toe Nail Assessment Left: Right: Thick: Yes Yes Discolored: Yes Yes Deformed: No No Improper Length and Hygiene: Yes Yes Electronic Signature(s) Signed: 09/13/2016 4:43:58 PM By: Alric Quan Entered By: Alric Quan on 09/13/2016 10:02:47 Brooke Hopkins ([id number]) -------------------------------------------------------------------------------- Multi Wound Chart Details Patient Name: Brooke Hopkins. Date of Service:  09/13/2016 9:30 AM Medical Record Number: [id number] Patient Account Number: [id number] Date of Birth/Sex: 06-09-1951 (64 y.o. Female) Treating RN: Montey Hora Primary Care Physician: FITZGERALD, DAVID Other Clinician: Referring Physician: FITZGERALD, DAVID Treating Physician/Extender: Frann Rider in Treatment: 12 Vital Signs Height(in): 67 Pulse(bpm): 63 Weight(lbs): 300 Blood Pressure 186/48 (mmHg): Body Mass Index(BMI): 47 Temperature(F): 97.8 Respiratory Rate 20 (breaths/min): Photos: [1:No Photos] [2:No Photos] [N/A:N/A] Wound Location: [1:Left Lower Leg - Circumfernential] [2:Right Lower Leg - Circumfernential] [N/A:N/A] Wounding Event: [1:Gradually Appeared] [2:Gradually Appeared] [N/A:N/A] Primary Etiology: [1:Lymphedema] [2:Lymphedema] [N/A:N/A] Date Acquired: [1:12/31/2015] [2:12/31/2015] [N/A:N/A] Weeks of Treatment: [1:12] [2:12] [N/A:N/A] Wound Status: [1:Open] [2:Open] [N/A:N/A] Clustered Wound: [1:No] [2:Yes] [N/A:N/A] Clustered Quantity: [  1:N/A] [2:3] [N/A:N/A] Measurements L x W x D 6x12x0.2 [2:5x9.5x0.2] [N/A:N/A] (cm) Area (cm) : [1:56.549] [2:37.306] [N/A:N/A] Volume (cm) : [1:11.31] [2:7.461] [N/A:N/A] % Reduction in Area: [1:43.70%] [2:83.50%] [N/A:N/A] % Reduction in Volume: 43.70% [2:83.50%] [N/A:N/A] Classification: [1:Partial Thickness] [2:Partial Thickness] [N/A:N/A] Exudate Amount: [1:Large] [2:Large] [N/A:N/A] Exudate Type: [1:Purulent] [2:Serosanguineous] [N/A:N/A] Exudate Color: [1:yellow, brown, green] [2:red, brown] [N/A:N/A] Foul Odor After [1:Yes] [2:Yes] [N/A:N/A] Cleansing: Odor Anticipated Due to No [2:No] [N/A:N/A] Product Use: Wound Margin: [1:Flat and Intact] [2:Indistinct, nonvisible] [N/A:N/A] Granulation Amount: [1:None Present (0%)] [2:None Present (0%)] [N/A:N/A] Necrotic Amount: [1:Large (67-100%)] [2:Large (67-100%)] [N/A:N/A] Exposed Structures: [1:Fascia: No Fat: No Tendon: No] [2:Fascia: No Fat: No Tendon: No]  [N/A:N/A] Muscle: No Muscle: No Joint: No Joint: No Bone: No Bone: No Limited to Skin Limited to Skin Breakdown Breakdown Epithelialization: None Small (1-33%) N/A Periwound Skin Texture: Edema: Yes Edema: Yes N/A Scarring: Yes Scarring: Yes Excoriation: No Excoriation: No Induration: No Induration: No Callus: No Callus: No Crepitus: No Crepitus: No Fluctuance: No Fluctuance: No Friable: No Friable: No Rash: No Rash: No Periwound Skin Maceration: Yes Maceration: Yes N/A Moisture: Moist: Yes Moist: Yes Dry/Scaly: No Dry/Scaly: No Periwound Skin Color: Hemosiderin Staining: Yes Hemosiderin Staining: Yes N/A Atrophie Blanche: No Atrophie Blanche: No Cyanosis: No Cyanosis: No Ecchymosis: No Ecchymosis: No Erythema: No Erythema: No Mottled: No Mottled: No Pallor: No Pallor: No Rubor: No Rubor: No Temperature: No Abnormality No Abnormality N/A Tenderness on Yes Yes N/A Palpation: Wound Preparation: Ulcer Cleansing: Other: Ulcer Cleansing: Other: N/A soap and water soap and water Topical Anesthetic Topical Anesthetic Applied: Other: lidocaine Applied: Other: lidocaine 4% 4% Treatment Notes Electronic Signature(s) Signed: 09/13/2016 5:02:11 PM By: Montey Hora Entered By: Montey Hora on 09/13/2016 10:13:57 Brooke Hopkins ([id number]) -------------------------------------------------------------------------------- Multi-Disciplinary Care Plan Details Patient Name: Brooke Hopkins, Brooke Hopkins. Date of Service: 09/13/2016 9:30 AM Medical Record Number: [id number] Patient Account Number: [id number] Date of Birth/Sex: Nov 10, 1951 (64 y.o. Female) Treating RN: Montey Hora Primary Care Physician: FITZGERALD, DAVID Other Clinician: Referring Physician: FITZGERALD, DAVID Treating Physician/Extender: Frann Rider in Treatment: 12 Active Inactive Orientation to the Wound Care Program Nursing Diagnoses: Knowledge deficit related to the wound healing  center program Goals: Patient/caregiver will verbalize understanding of the [facility] Date Initiated: 06/21/2016 Goal Status: Active Interventions: Provide education on orientation to the wound center Notes: Wound/Skin Impairment Nursing Diagnoses: Impaired tissue integrity Goals: Patient/caregiver will verbalize understanding of skin care regimen Date Initiated: 06/21/2016 Goal Status: Active Ulcer/skin breakdown will have a volume reduction of 30% by week 4 Date Initiated: 06/21/2016 Goal Status: Active Ulcer/skin breakdown will have a volume reduction of 50% by week 8 Date Initiated: 06/21/2016 Goal Status: Active Ulcer/skin breakdown will have a volume reduction of 80% by week 12 Date Initiated: 06/21/2016 Goal Status: Active Ulcer/skin breakdown will heal within 14 weeks Date Initiated: 06/21/2016 Goal Status: Active PEITYN, PAYTON ([id number]) Interventions: Assess patient/caregiver ability to obtain necessary supplies Assess patient/caregiver ability to perform ulcer/skin care regimen upon admission and as needed Assess ulceration(s) every visit Provide education on ulcer and skin care Notes: Electronic Signature(s) Signed: 09/13/2016 5:02:11 PM By: Montey Hora Entered By: Montey Hora on 09/13/2016 10:13:41 Brooke Hopkins ([id number]) -------------------------------------------------------------------------------- Pain Assessment Details Patient Name: Brooke Hopkins, Brooke Hopkins. Date of Service: 09/13/2016 9:30 AM Medical Record Number: [id number] Patient Account Number: [id number] Date of Birth/Sex: 01-01-1951 (64 y.o. Female) Treating RN: Ahmed Prima Primary Care Physician: FITZGERALD, DAVID Other Clinician: Referring Physician: FITZGERALD, DAVID Treating Physician/Extender: Frann Rider in Treatment:  12 Active Problems Location of Pain Severity and Description of Pain Patient Has Paino No Site Locations With Dressing Change:  No Pain Management and Medication Current Pain Management: Electronic Signature(s) Signed: 09/13/2016 4:43:58 PM By: Alric Quan Entered By: Alric Quan on 09/13/2016 09:49:06 Brooke Hopkins ([id number]) -------------------------------------------------------------------------------- Patient/Caregiver Education Details Patient Name: Brooke Hopkins, Brooke Hopkins. Date of Service: 09/13/2016 9:30 AM Medical Record Number: [id number] Patient Account Number: [id number] Date of Birth/Gender: [birth year] (64 y.o. Female) Treating RN: Ahmed Prima Primary Care Physician: FITZGERALD, DAVID Other Clinician: Referring Physician: FITZGERALD, DAVID Treating Physician/Extender: Frann Rider in Treatment: 12 Education Assessment Education Provided To: Patient Education Topics Provided Wound/Skin Impairment: Handouts: Other: change dressing as ordered and do not get dressing wet Methods: Demonstration, Explain/Verbal Responses: State content correctly Electronic Signature(s) Signed: 09/13/2016 4:43:58 PM By: Alric Quan Entered By: Alric Quan on 09/13/2016 10:09:31 Brooke Hopkins ([id number]) -------------------------------------------------------------------------------- Wound Assessment Details Patient Name: Brooke Hopkins, Brooke Hopkins. Date of Service: 09/13/2016 9:30 AM Medical Record Number: [id number] Patient Account Number: [id number] Date of Birth/Sex: 03-May-1951 (64 y.o. Female) Treating RN: Ahmed Prima Primary Care Physician: FITZGERALD, DAVID Other Clinician: Referring Physician: FITZGERALD, DAVID Treating Physician/Extender: Frann Rider in Treatment: 12 Wound Status Wound Number: 1 Primary Etiology: Lymphedema Wound Location: Left Lower Leg - Wound Status: Open Circumfernential Wounding Event: Gradually Appeared Date Acquired: 12/31/2015 Weeks Of Treatment: 12 Clustered Wound: No Photos Photo Uploaded By: Alric Quan on 09/13/2016  10:40:13 Wound Measurements Length: (cm) 6 Width: (cm) 12 Depth: (cm) 0.2 Area: (cm) 56.549 Volume: (cm) 11.31 % Reduction in Area: 43.7% % Reduction in Volume: 43.7% Epithelialization: None Tunneling: No Undermining: No Wound Description Classification: Partial Thickness Wound Margin: Flat and Intact Exudate Amount: Large Exudate Type: Purulent Exudate Color: yellow, brown, green Foul Odor After Cleansing: Yes Due to Product Use: No Wound Bed Granulation Amount: None Present (0%) Exposed Structure Necrotic Amount: Large (67-100%) Fascia Exposed: No Necrotic Quality: Adherent Slough Fat Layer Exposed: No JAKARIA, LAVERGNE ([id number]) Tendon Exposed: No Muscle Exposed: No Joint Exposed: No Bone Exposed: No Limited to Skin Breakdown Periwound Skin Texture Texture Color No Abnormalities Noted: No No Abnormalities Noted: No Callus: No Atrophie Blanche: No Crepitus: No Cyanosis: No Excoriation: No Ecchymosis: No Fluctuance: No Erythema: No Friable: No Hemosiderin Staining: Yes Induration: No Mottled: No Localized Edema: Yes Pallor: No Rash: No Rubor: No Scarring: Yes Temperature / Pain Moisture Temperature: No Abnormality No Abnormalities Noted: No Tenderness on Palpation: Yes Dry / Scaly: No Maceration: Yes Moist: Yes Wound Preparation Ulcer Cleansing: Other: soap and water, Topical Anesthetic Applied: Other: lidocaine 4%, Treatment Notes Wound #1 (Left, Circumferential Lower Leg) 1. Cleansed with: Cleanse wound with antibacterial soap and water 2. Anesthetic Topical Lidocaine 4% cream to wound bed prior to debridement 4. Dressing Applied: Aquacel Ag Santyl Ointment 5. Secondary Dressing Applied ABD Pad Dry Gauze 7. Secured with Tape 3 Layer Compression System - Bilateral Notes unna to anchor Brooke Hopkins, Brooke Hopkins ([id number]) Electronic Signature(s) Signed: 09/13/2016 4:43:58 PM By: Alric Quan Entered By: Alric Quan on  09/13/2016 10:03:03 Brooke Hopkins ([id number]) -------------------------------------------------------------------------------- Wound Assessment Details Patient Name: Brooke Hopkins, Brooke Hopkins. Date of Service: 09/13/2016 9:30 AM Medical Record Number: [id number] Patient Account Number: [id number] Date of Birth/Sex: 1951-03-26 (64 y.o. Female) Treating RN: Ahmed Prima Primary Care Physician: FITZGERALD, DAVID Other Clinician: Referring Physician: FITZGERALD, DAVID Treating Physician/Extender: Frann Rider in Treatment: 12 Wound Status Wound Number: 2 Primary Etiology: Lymphedema Wound Location: Right Lower Leg - Wound Status: Open Circumfernential Wounding Event: Gradually  Appeared Date Acquired: 12/31/2015 Weeks Of Treatment: 12 Clustered Wound: Yes Photos Photo Uploaded By: Alric Quan on 09/13/2016 10:40:14 Wound Measurements Length: (cm) 5 Width: (cm) 9.5 Depth: (cm) 0.2 Clustered Quantity: 3 Area: (cm) 37.306 Volume: (cm) 7.461 % Reduction in Area: 83.5% % Reduction in Volume: 83.5% Epithelialization: Small (1-33%) Tunneling: No Undermining: No Wound Description Classification: Partial Thickness Wound Margin: Indistinct, nonvisible Exudate Amount: Large Exudate Type: Serosanguineous Exudate Color: red, brown Foul Odor After Cleansing: Yes Due to Product Use: No Wound Bed Granulation Amount: None Present (0%) Exposed Structure Necrotic Amount: Large (67-100%) Fascia Exposed: No Brooke Hopkins, Brooke Hopkins ([id number]) Necrotic Quality: Adherent Slough Fat Layer Exposed: No Tendon Exposed: No Muscle Exposed: No Joint Exposed: No Bone Exposed: No Limited to Skin Breakdown Periwound Skin Texture Texture Color No Abnormalities Noted: No No Abnormalities Noted: No Callus: No Atrophie Blanche: No Crepitus: No Cyanosis: No Excoriation: No Ecchymosis: No Fluctuance: No Erythema: No Friable: No Hemosiderin Staining: Yes Induration: No Mottled:  No Localized Edema: Yes Pallor: No Rash: No Rubor: No Scarring: Yes Temperature / Pain Moisture Temperature: No Abnormality No Abnormalities Noted: No Tenderness on Palpation: Yes Dry / Scaly: No Maceration: Yes Moist: Yes Wound Preparation Ulcer Cleansing: Other: soap and water, Topical Anesthetic Applied: Other: lidocaine 4%, Treatment Notes Wound #2 (Right, Circumferential Lower Leg) 1. Cleansed with: Cleanse wound with antibacterial soap and water 2. Anesthetic Topical Lidocaine 4% cream to wound bed prior to debridement 4. Dressing Applied: Aquacel Ag Santyl Ointment 5. Secondary Dressing Applied ABD Pad Dry Gauze 7. Secured with Tape 3 Layer Compression System - Bilateral Notes unna to anchor Brooke Hopkins, Brooke Hopkins ([id number]) Electronic Signature(s) Signed: 09/13/2016 4:43:58 PM By: Alric Quan Entered By: Alric Quan on 09/13/2016 10:03:36 Brooke Hopkins ([id number]) -------------------------------------------------------------------------------- Vitals Details Patient Name: NYAJAH, HYSON. Date of Service: 09/13/2016 9:30 AM Medical Record Number: [id number] Patient Account Number: [id number] Date of Birth/Sex: 03-Aug-1951 (64 y.o. Female) Treating RN: Ahmed Prima Primary Care Physician: FITZGERALD, DAVID Other Clinician: Referring Physician: FITZGERALD, DAVID Treating Physician/Extender: Frann Rider in Treatment: 12 Vital Signs Time Taken: 09:49 Temperature (F): 97.8 Height (in): 67 Pulse (bpm): 63 Weight (lbs): 300 Respiratory Rate (breaths/min): 20 Body Mass Index (BMI): 47 Blood Pressure (mmHg): 186/48 Reference Range: 80 - 120 mg / dl Notes 166/80 manually Electronic Signature(s) Signed: 09/13/2016 4:43:58 PM By: Alric Quan Entered By: Alric Quan on 09/13/2016 09:53:59

## 2016-09-20 ENCOUNTER — Encounter: Payer: 59 | Admitting: Surgery

## 2016-09-20 DIAGNOSIS — I890 Lymphedema, not elsewhere classified: Secondary | ICD-10-CM | POA: Diagnosis not present

## 2016-09-20 DIAGNOSIS — L97812 Non-pressure chronic ulcer of other part of right lower leg with fat layer exposed: Secondary | ICD-10-CM | POA: Diagnosis not present

## 2016-09-20 DIAGNOSIS — L97822 Non-pressure chronic ulcer of other part of left lower leg with fat layer exposed: Secondary | ICD-10-CM | POA: Diagnosis not present

## 2016-09-20 DIAGNOSIS — E11622 Type 2 diabetes mellitus with other skin ulcer: Secondary | ICD-10-CM | POA: Diagnosis not present

## 2016-09-21 NOTE — Progress Notes (Signed)
Brooke Hopkins, Brooke Hopkins ([id number]) Visit Report for 09/20/2016 Chief Complaint Document Details Patient Name: Brooke Hopkins, Brooke Hopkins. Date of Service: 09/20/2016 8:45 AM Medical Record Number: [id number] Patient Account Number: [id number] Date of Birth/Sex: 1951-11-08 (65 y.o. Female) Treating RN: Brooke Hopkins Primary Care Physician: Brooke Hopkins Other Clinician: Referring Physician: FITZGERALD, Hopkins Treating Physician/Extender: Brooke Hopkins in Treatment: 13 Information Obtained from: Patient Chief Complaint Patients presents for treatment of an open diabetic ulcer and significant lymphedema which she's had for about two years Electronic Signature(s) Signed: 09/20/2016 9:29:37 AM By: Brooke Fudge MD, FACS Entered By: Brooke Hopkins on 09/20/2016 09:29:37 Brooke Hopkins ([id number]) -------------------------------------------------------------------------------- Debridement Details Patient Name: Brooke Hopkins, Brooke Hopkins. Date of Service: 09/20/2016 8:45 AM Medical Record Number: [id number] Patient Account Number: [id number] Date of Birth/Sex: 1951-10-08 (65 y.o. Female) Treating RN: Brooke Hopkins Primary Care Physician: Brooke Hopkins Other Clinician: Referring Physician: FITZGERALD, Hopkins Treating Physician/Extender: Brooke Hopkins in Treatment: 13 Debridement Performed for Wound #1 Left,Circumferential Lower Leg Assessment: Performed By: Physician Brooke Fudge, MD Debridement: Debridement Pre-procedure Yes - 09:13 Verification/Time Out Taken: Start Time: 09:13 Pain Control: Lidocaine 4% Topical Solution Level: Skin/Subcutaneous Tissue Total Area Debrided (L x 4 (cm) x 4 (cm) = 16 (cm) W): Tissue and other Viable, Non-Viable, Exudate, Fibrin/Slough, Subcutaneous material debrided: Instrument: Curette Bleeding: Minimum Hemostasis Achieved: Pressure End Time: 09:14 Procedural Pain: 0 Post Procedural Pain: 0 Response to Treatment: Procedure was tolerated  well Post Debridement Measurements of Total Wound Length: (cm) 4 Width: (cm) 4 Depth: (cm) 0.2 Volume: (cm) 2.513 Character of Wound/Ulcer Post Requires Further Debridement Debridement: Severity of Tissue Post Debridement: Fat layer exposed Post Procedure Diagnosis Same as Pre-procedure Electronic Signature(s) Signed: 09/20/2016 9:29:09 AM By: Brooke Fudge MD, FACS Signed: 09/20/2016 5:24:41 PM By: Alric Quan Entered By: Brooke Hopkins on 09/20/2016 09:29:08 Brooke Hopkins ([name], Brooke Hopkins ([id number]) -------------------------------------------------------------------------------- Debridement Details Patient Name: Brooke Hopkins, Brooke Hopkins. Date of Service: 09/20/2016 8:45 AM Medical Record Number: [id number] Patient Account Number: [id number] Date of Birth/Sex: 1951/01/14 (65 y.o. Female) Treating RN: Brooke Hopkins Primary Care Physician: Brooke Hopkins Other Clinician: Referring Physician: FITZGERALD, Hopkins Treating Physician/Extender: Brooke Hopkins in Treatment: 13 Debridement Performed for Wound #2 Right,Circumferential Lower Leg Assessment: Performed By: Physician Brooke Fudge, MD Debridement: Debridement Pre-procedure Yes - 09:13 Verification/Time Out Taken: Start Time: 09:15 Pain Control: Lidocaine 4% Topical Solution Level: Skin/Subcutaneous Tissue Total Area Debrided (L x 3 (cm) x 3 (cm) = 9 (cm) W): Tissue and other Viable, Non-Viable, Exudate, Fibrin/Slough, Subcutaneous material debrided: Instrument: Curette Bleeding: Minimum Hemostasis Achieved: Pressure End Time: 09:16 Procedural Pain: 0 Post Procedural Pain: 0 Response to Treatment: Procedure was tolerated well Post Debridement Measurements of Total Wound Length: (cm) 3 Width: (cm) 3 Depth: (cm) 0.3 Volume: (cm) 2.121 Character of Wound/Ulcer Post Requires Further Debridement Debridement: Severity of Tissue Post Debridement: Fat layer exposed Post Procedure  Diagnosis Same as Pre-procedure Electronic Signature(s) Signed: 09/20/2016 9:29:17 AM By: Brooke Fudge MD, FACS Signed: 09/20/2016 5:24:41 PM By: Alric Quan Entered By: Brooke Hopkins on 09/20/2016 09:29:16 Brooke Hopkins ([id number]) Brooke Hopkins, Brooke Hopkins ([id number]) -------------------------------------------------------------------------------- HPI Details Patient Name: Brooke Hopkins, Brooke Hopkins. Date of Service: 09/20/2016 8:45 AM Medical Record Number: [id number] Patient Account Number: [id number] Date of Birth/Sex: 1951/10/26 (65 y.o. Female) Treating RN: Brooke Hopkins Primary Care Physician: Brooke Hopkins Other Clinician: Referring Physician: FITZGERALD, Hopkins Treating Physician/Extender: Brooke Hopkins in Treatment: 13 History of Present Illness Location: massive swelling of both lower extremities and ulceration both lower extremities Quality: Patient reports experiencing  a dull pain to affected area(s). Severity: Patient states wound are getting worse. Duration: Patient has had the wound for >2 years prior to seeking treatment at the wound center Timing: Pain in wound is constant (hurts all the time) Context: The wound appeared gradually over time Modifying Factors: Other treatment(s) tried include:she has a lymphedema pump but uses it seldom and she's had several course of antibiotics Associated Signs and Symptoms: Patient reports having difficulty standing for long periods. HPI Description: 65 year old patient seen by Dr. Ola Spurr of infectious disease who has been following up for left lower extremity cellulitis and ulcer with recurrent bilateral lower extremity problems for several months. Recently she had a large right lower extremity bullae which opened out and has been ulcerated. She has seen the vascular group and has been getting Unna's wraps and has a lymphedema pump used in the past. Her prior cultures were positive for Pseudomonas, Proteus and was  treated with amoxicillin. He has also been treated with 2 weeks course of ciprofloxacin and amoxicillin.. Increase of Lasix dose helped with the edema and echo showed no systolic CHF but may be diastolic problems. Past medical history significant for diabetes mellitus type 2, venous stasis ulcer, obesity, diabetic peripheral neuropathy, status post back surgery, cholecystectomy, hysterectomy, arthroscopy of the knee. He is a former smoker and quit smoking in 1984. The patient has seen Dr. Delana Meyer who did not recommend any arterial or venous duplex studies and has been using Unna's wraps and also recommended a lymphedema pump. she has been very noncompliant with using these. 06/28/2016 -- the patient is still on antibiotics as prescribed by Dr. Ola Spurr and he is asked her to take it for 3 weeks. The patient also says she has a lot of redness and pain in the folds of her thigh and lower extremity and this is very painful. 07/26/2016 -- the patient is off antibiotics and has been getting dressing changes 3 times a week. 08/09/2016 -- is been on Cipro and is taking potassium supplements along with her Lasix and is going to be seeing Dr. Ola Spurr for a consult return visit only in November 2017. 08/23/2016 -- she was admitted to the hospital last week and I have reviewed these reports in detail. She was seen by Dr. Ola Spurr who noted a Pseudomonas infection which was resistant to ciprofloxacin and discharged her on Ceftazidime 1 g IV every 12 hourly anyone days. The antibiotic was to be stopped on September 11 and he would see her back in the clinic. 08/30/2016 -- had a communication from Dr. Ola Spurr that he would extend her antibiotics by a week if she Brooke Hopkins, Brooke Hopkins. ([id number]) continued to look like cellulitis was persisting. 09/13/2016 -- the PICC line is out and antibiotics have stopped. Electronic Signature(s) Signed: 09/20/2016 9:29:41 AM By: Brooke Fudge MD, FACS Entered By:  Brooke Hopkins on 09/20/2016 09:29:41 Brooke Hopkins ([id number]) -------------------------------------------------------------------------------- Physical Exam Details Patient Name: Brooke Hopkins, Brooke Hopkins. Date of Service: 09/20/2016 8:45 AM Medical Record Number: [id number] Patient Account Number: [id number] Date of Birth/Sex: [birth year] (65 y.o. Female) Treating RN: Brooke Hopkins Primary Care Physician: Brooke Hopkins Other Clinician: Referring Physician: FITZGERALD, Hopkins Treating Physician/Extender: Brooke Hopkins in Treatment: 13 Constitutional . Pulse regular. Respirations normal and unlabored. Afebrile. . Eyes Nonicteric. Reactive to light. Ears, Nose, Mouth, and Throat Lips, teeth, and gums WNL.Marland Kitchen Moist mucosa without lesions. Neck supple and nontender. No palpable supraclavicular or cervical adenopathy. Normal sized without goiter. Respiratory WNL. No retractions.. Cardiovascular Pedal Pulses WNL. No  clubbing, cyanosis or edema. Lymphatic No adneopathy. No adenopathy. No adenopathy. Musculoskeletal Adexa without tenderness or enlargement.. Digits and nails w/o clubbing, cyanosis, infection, petechiae, ischemia, or inflammatory conditions.. Integumentary (Hair, Skin) No suspicious lesions. No crepitus or fluctuance. No peri-wound warmth or erythema. No masses.Marland Kitchen Psychiatric Judgement and insight Intact.. No evidence of depression, anxiety, or agitation.. Notes treatment with a #3 curet was done for both the lower extremity posterior wounds and bleeding controlled with pressure. Lymphedema is a bit better. Electronic Signature(s) Signed: 09/20/2016 9:30:14 AM By: Brooke Fudge MD, FACS Entered By: Brooke Hopkins on 09/20/2016 09:30:14 Brooke Hopkins ([id number]) -------------------------------------------------------------------------------- Physician Orders Details Patient Name: Brooke Hopkins, Brooke Hopkins. Date of Service: 09/20/2016 8:45 AM Medical Record Number:  [id number] Patient Account Number: [id number] Date of Birth/Sex: [birth year] (65 y.o. Female) Treating RN: Brooke Hopkins Primary Care Physician: Brooke Hopkins Other Clinician: Referring Physician: FITZGERALD, Hopkins Treating Physician/Extender: Brooke Hopkins in Treatment: 17 Verbal / Phone Orders: Yes Clinician: Carolyne Fiscal, Debi Read Back and Verified: Yes Diagnosis Coding Wound Cleansing Wound #1 Left,Circumferential Lower Leg o Cleanse wound with mild soap and water o May Shower, gently pat wound dry prior to applying new dressing. o May shower with protection. Wound #2 Right,Circumferential Lower Leg o Cleanse wound with mild soap and water o May Shower, gently pat wound dry prior to applying new dressing. o May shower with protection. Anesthetic Wound #1 Left,Circumferential Lower Leg o Topical Lidocaine 4% cream applied to wound bed prior to debridement Wound #2 Right,Circumferential Lower Leg o Topical Lidocaine 4% cream applied to wound bed prior to debridement Skin Barriers/Peri-Wound Care Wound #1 Left,Circumferential Lower Leg o Other: - Lotrisone cream in folds at knees and on reddened areas on legs (Nystatin used in clinic) Wound #2 Right,Circumferential Lower Leg o Other: - Lotrisone cream in folds at knees and on reddened areas on legs (Nystatin used in clinic) Primary Wound Dressing Wound #1 Left,Circumferential Lower Leg o Santyl Ointment - santyl on the posterior wounds only o Aquacel Ag - aquacel ag on all other open areas Wound #2 Right,Circumferential Lower Leg o Santyl Ointment - santyl on the posterior wounds only o Aquacel Ag - aquacel ag on all other open areas Secondary Dressing Wound #1 Left,Circumferential Lower Leg Brooke Hopkins, Brooke Hopkins ([id number]) o ABD pad o Dry Gauze - over the santyl areas with the ABD pad Wound #2 Right,Circumferential Lower Leg o ABD pad o Dry Gauze - over the santyl areas with the  ABD pad Dressing Change Frequency Wound #1 Left,Circumferential Lower Leg o Other: - Tuesday and Friday - twice weekly - Fridays at [place] Wound #2 Right,Circumferential Lower Leg o Other: - Tuesday and Friday - twice weekly - Fridays at [place] Follow-up Appointments Wound #1 Left,Circumferential Lower Leg o Return Appointment in 1 week. Wound #2 Right,Circumferential Lower Leg o Return Appointment in 1 week. Edema Control Wound #1 Left,Circumferential Lower Leg o 3 Layer Compression System - Bilateral - May anchor top of wrap with dome paste, but only wrap around leg one time. o Elevate legs to the level of the heart and pump ankles as often as possible Wound #2 Right,Circumferential Lower Leg o 3 Layer Compression System - Bilateral - May anchor top of wrap with dome paste, but only wrap around leg one time. o Elevate legs to the level of the heart and pump ankles as often as possible Home Health Wound #1 Left,Circumferential Lower Leg o [place] Visits - [place]  Nurse may visit PRN to address patientos wound care needs. o FACE TO FACE ENCOUNTER: MEDICARE and MEDICAID PATIENTS: I certify that this patient is under my care and that I had a face-to-face encounter that meets the physician face-to-face encounter requirements with this patient on this date. The encounter with the patient was in whole or in part for the following MEDICAL CONDITION: (primary reason for [place]) MEDICAL NECESSITY: I certify, that based on my findings, NURSING services are a medically necessary home health service. HOME BOUND STATUS: I certify that my clinical findings support that this patient is homebound (i.e., Due to illness or injury, pt requires aid of supportive devices such as crutches, cane, wheelchairs, walkers, the use of special transportation or the assistance of another person to leave their place of  residence. There is a normal inability to leave the home and doing so requires considerable and taxing effort. Other absences are for medical reasons / religious services and are infrequent or of short duration when for other reasons). XOCHILT, CONANT ([id number]) o Please direct any NON-WOUND related issues/requests for orders to patient's Primary Care Physician Wound #2 Right,Circumferential Lower Leg o [place] Visits - [place] Nurse may visit PRN to address patientos wound care needs. o FACE TO FACE ENCOUNTER: MEDICARE and MEDICAID PATIENTS: I certify that this patient is under my care and that I had a face-to-face encounter that meets the physician face-to-face encounter requirements with this patient on this date. The encounter with the patient was in whole or in part for the following MEDICAL CONDITION: (primary reason for [place]) MEDICAL NECESSITY: I certify, that based on my findings, NURSING services are a medically necessary home health service. HOME BOUND STATUS: I certify that my clinical findings support that this patient is homebound (i.e., Due to illness or injury, pt requires aid of supportive devices such as crutches, cane, wheelchairs, walkers, the use of special transportation or the assistance of another person to leave their place of residence. There is a normal inability to leave the home and doing so requires considerable and taxing effort. Other absences are for medical reasons / religious services and are infrequent or of short duration when for other reasons). o Please direct any NON-WOUND related issues/requests for orders to patient's Primary Care Physician Electronic Signature(s) Signed: 09/20/2016 3:52:17 PM By: Brooke Fudge MD, FACS Signed: 09/20/2016 5:24:41 PM By: Alric Quan Entered By: Alric Quan on 09/20/2016 09:18:37 Brooke Hopkins  ([id number]) -------------------------------------------------------------------------------- Problem List Details Patient Name: Brooke Hopkins, Brooke Hopkins. Date of Service: 09/20/2016 8:45 AM Medical Record Number: [id number] Patient Account Number: [id number] Date of Birth/Sex: 10-07-1951 (65 y.o. 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 Date of Service: 09/20/2016 8:45 AM Medical Record Number: [id number] Patient Account Number: [id number] Date  of Birth/Sex: December 26, 1951 (65 y.o. Female) Treating RN: Brooke Hopkins Primary Care Physician: Brooke Hopkins Other Clinician: Referring Physician: FITZGERALD, Hopkins Treating Physician/Extender: Brooke Hopkins in Treatment: 13 Subjective Chief Complaint Information obtained from Patient Patients presents for treatment of an open diabetic ulcer and significant lymphedema which she's had for about two years History of Present Illness (HPI) The following HPI elements were  documented for the patient's wound: Location: massive swelling of both lower extremities and ulceration both lower extremities Quality: Patient reports experiencing a dull pain to affected area(s). Severity: Patient states wound are getting worse. Duration: Patient has had the wound for >2 years prior to seeking treatment at the wound center Timing: Pain in wound is constant (hurts all the time) Context: The wound appeared gradually over time Modifying Factors: Other treatment(s) tried include:she has a lymphedema pump but uses it seldom and she's had several course of antibiotics Associated Signs and Symptoms: Patient reports having difficulty standing for long periods. 65 year old patient seen by Dr. Ola Spurr of infectious disease who has been following up for left lower extremity cellulitis and ulcer with recurrent bilateral lower extremity problems for several months. Recently she had a large right lower extremity bullae which opened out and has been ulcerated. She has seen the vascular group and has been getting Unna's wraps and has a lymphedema pump used in the past. Her prior cultures were positive for Pseudomonas, Proteus and was treated with amoxicillin. He has also been treated with 2 weeks course of ciprofloxacin and amoxicillin.. Increase of Lasix dose helped with the edema and echo showed no systolic CHF but may be diastolic problems. Past medical history significant for diabetes mellitus type 2, venous stasis ulcer, obesity, diabetic peripheral neuropathy, status post back surgery, cholecystectomy, hysterectomy, arthroscopy of the knee. He is a former smoker and quit smoking in 1984. The patient has seen Dr. Delana Meyer who did not recommend any arterial or venous duplex studies and has been using Unna's wraps and also recommended a lymphedema pump. she has been very noncompliant with using these. 06/28/2016 -- the patient is still on antibiotics as prescribed by Dr. Ola Spurr and  he is asked her to take it for 3 weeks. The patient also says she has a lot of redness and pain in the folds of her thigh and lower extremity and this is very painful. 07/26/2016 -- the patient is off antibiotics and has been getting dressing changes 3 times a week. 08/09/2016 -- is been on Cipro and is taking potassium supplements along with her Lasix and is going to be Brooke Hopkins, Brooke Hopkins. ([id number]) seeing Dr. Ola Spurr for a consult return visit only in November 2017. 08/23/2016 -- she was admitted to the hospital last week and I have reviewed these reports in detail. She was seen by Dr. Ola Spurr who noted a Pseudomonas infection which was resistant to ciprofloxacin and discharged her on Ceftazidime 1 g IV every 12 hourly anyone days. The antibiotic was to be stopped on September 11 and he would see her back in the clinic. 08/30/2016 -- had a communication from Dr. Ola Spurr that he would extend her antibiotics by a week if she continued to look like cellulitis was persisting. 09/13/2016 -- the PICC line is out and antibiotics have stopped. Objective Constitutional Pulse regular. Respirations normal and unlabored. Afebrile. Vitals Time Taken: 8:48 AM, Height: 67 in, Weight: 300 lbs, BMI: 47, Temperature: 97.8 F, Pulse: 73 bpm, Respiratory Rate: 20 breaths/min, Blood Pressure: 158/70 mmHg. Eyes Nonicteric. Reactive to light. Ears, Nose,  Mouth, and Throat Lips, teeth, and gums WNL.Marland Kitchen Moist mucosa without lesions. Neck supple and nontender. No palpable supraclavicular or cervical adenopathy. Normal sized without goiter. Respiratory WNL. No retractions.. Cardiovascular Pedal Pulses WNL. No clubbing, cyanosis or edema. Lymphatic No adneopathy. No adenopathy. No adenopathy. Musculoskeletal Adexa without tenderness or enlargement.. Digits and nails w/o clubbing, cyanosis, infection, petechiae, ischemia, or inflammatory conditions.Marland Kitchen Psychiatric Judgement and insight Intact.. No  evidence of depression, anxiety, or agitation.Marland Kitchen Brooke Hopkins, BURROWES. ([id number]) General Notes: treatment with a #3 curet was done for both the lower extremity posterior wounds and bleeding controlled with pressure. Lymphedema is a bit better. Integumentary (Hair, Skin) No suspicious lesions. No crepitus or fluctuance. No peri-wound warmth or erythema. No masses.. Wound #1 status is Open. Original cause of wound was Gradually Appeared. The wound is located on the Left,Circumferential Lower Leg. The wound measures 6cm length x 12cm width x 0.2cm depth; 56.549cm^2 area and 11.31cm^3 volume. The wound is limited to skin breakdown. There is no tunneling or undermining noted. There is a large amount of serous drainage noted. The wound margin is flat and intact. There is no granulation within the wound bed. There is a large (67-100%) amount of necrotic tissue within the wound bed including Adherent Slough. The periwound skin appearance exhibited: Localized Edema, Scarring, Maceration, Moist, Hemosiderin Staining. The periwound skin appearance did not exhibit: Callus, Crepitus, Excoriation, Fluctuance, Friable, Induration, Rash, Dry/Scaly, Atrophie Blanche, Cyanosis, Ecchymosis, Mottled, Pallor, Rubor, Erythema. Periwound temperature was noted as No Abnormality. The periwound has tenderness on palpation. Wound #2 status is Open. Original cause of wound was Gradually Appeared. The wound is located on the Right,Circumferential Lower Leg. The wound measures 5cm length x 9.5cm width x 0.3cm depth; 37.306cm^2 area and 11.192cm^3 volume. The wound is limited to skin breakdown. There is no tunneling or undermining noted. There is a large amount of serous drainage noted. The wound margin is indistinct and nonvisible. There is no granulation within the wound bed. There is a large (67-100%) amount of necrotic tissue within the wound bed including Adherent Slough. The periwound skin appearance  exhibited: Localized Edema, Scarring, Maceration, Moist, Hemosiderin Staining. The periwound skin appearance did not exhibit: Callus, Crepitus, Excoriation, Fluctuance, Friable, Induration, Rash, Dry/Scaly, Atrophie Blanche, Cyanosis, Ecchymosis, Mottled, Pallor, Rubor, Erythema. Periwound temperature was noted as No Abnormality. The periwound has tenderness on palpation. 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 ([id number]) Wound #1 Wound #1 is a Lymphedema located on the Left,Circumferential Lower Leg . There was a Skin/Subcutaneous Tissue Debridement ([id number]) debridement with total area of 16 sq cm performed by Brooke Fudge, MD. with the following instrument(s): Curette to remove Viable and Non-Viable tissue/material including Exudate, Fibrin/Slough, and Subcutaneous after achieving pain control using Lidocaine 4% Topical Solution. A time out was conducted at 09:13, prior to the start of the procedure. A Minimum amount of bleeding was controlled with Pressure. The procedure was tolerated well with a pain level of 0 throughout and a pain level of 0 following the procedure. Post Debridement Measurements: 4cm length x 4cm width x 0.2cm depth; 2.513cm^3 volume. Character of Wound/Ulcer Post Debridement requires further debridement. Severity of Tissue Post Debridement is: Fat layer  exposed. Post procedure Diagnosis Wound #1: Same as Pre-Procedure Wound #2 Wound #2 is a Lymphedema located on the Right,Circumferential Lower Leg . There was a Skin/Subcutaneous Tissue Debridement ([id number]) debridement with total area of 9 sq  cm performed by Brooke Fudge, MD. with the following instrument(s): Curette to remove Viable and Non-Viable tissue/material including Exudate, Fibrin/Slough, and Subcutaneous after achieving pain control using Lidocaine 4% Topical Solution. A time out was conducted at 09:13, prior to the start of the procedure. A Minimum amount of bleeding was controlled with Pressure. The procedure was tolerated well with a pain level of 0 throughout and a pain level of 0 following the procedure. Post Debridement Measurements: 3cm length x 3cm width x 0.3cm depth; 2.121cm^3 volume. Character of Wound/Ulcer Post Debridement requires further debridement. Severity of Tissue Post Debridement is: Fat layer exposed. Post procedure Diagnosis Wound #2: Same as Pre-Procedure Plan Wound Cleansing: Wound #1 Left,Circumferential Lower Leg: Cleanse wound with mild soap and water May Shower, gently pat wound dry prior to applying new dressing. May shower with protection. Wound #2 Right,Circumferential Lower Leg: Cleanse wound with mild soap and water May Shower, gently pat wound dry prior to applying new dressing. May shower with protection. Anesthetic: Wound #1 Left,Circumferential Lower Leg: Topical Lidocaine 4% cream applied to wound bed prior to debridement Wound #2 Right,Circumferential Lower Leg: Topical Lidocaine 4% cream applied to wound bed prior to debridement Skin Barriers/Peri-Wound Care: JAMELLA, GRAYER ([id number]) Wound #1 Left,Circumferential Lower Leg: Other: - Lotrisone cream in folds at knees and on reddened areas on legs (Nystatin used in clinic) Wound #2 Right,Circumferential Lower Leg: Other: - Lotrisone cream in folds at knees and on reddened areas on legs (Nystatin used in clinic) Primary Wound Dressing: Wound #1 Left,Circumferential Lower Leg: Santyl Ointment - santyl on the posterior wounds only Aquacel Ag - aquacel ag on all other open areas Wound #2 Right,Circumferential Lower  Leg: Santyl Ointment - santyl on the posterior wounds only Aquacel Ag - aquacel ag on all other open areas Secondary Dressing: Wound #1 Left,Circumferential Lower Leg: ABD pad Dry Gauze - over the santyl areas with the ABD pad Wound #2 Right,Circumferential Lower Leg: ABD pad Dry Gauze - over the santyl areas with the ABD pad Dressing Change Frequency: Wound #1 Left,Circumferential Lower Leg: Other: - Tuesday and Friday - twice weekly - Fridays at [place] Wound #2 Right,Circumferential Lower Leg: Other: - Tuesday and Friday - twice weekly - Fridays at [place] Follow-up Appointments: Wound #1 Left,Circumferential Lower Leg: Return Appointment in 1 week. Wound #2 Right,Circumferential Lower Leg: Return Appointment in 1 week. Edema Control: Wound #1 Left,Circumferential Lower Leg: 3 Layer Compression System - Bilateral - May anchor top of wrap with dome paste, but only wrap around leg one time. Elevate legs to the level of the heart and pump ankles as often as possible Wound #2 Right,Circumferential Lower Leg: 3 Layer Compression System - Bilateral - May anchor top of wrap with dome paste, but only wrap around leg one time. Elevate legs to the level of the heart and pump ankles as often as possible Home Health: Wound #1 Left,Circumferential Lower Leg: Continue Home Health Visits - [place] Nurse may visit PRN to address patient s wound care needs. FACE TO FACE ENCOUNTER: MEDICARE and MEDICAID PATIENTS: I certify that this patient is under my care and that I had a face-to-face encounter that meets the physician face-to-face encounter requirements with this patient on this date. The encounter  with the patient was in whole or in part for the following MEDICAL CONDITION: (primary reason for [place]) MEDICAL NECESSITY: I certify, that based on my findings, NURSING services are a medically necessary home health service. HOME BOUND STATUS:  I certify that my clinical findings support that this patient is homebound (i.e., Due to illness or injury, pt requires aid of supportive devices such as crutches, cane, wheelchairs, walkers, the use of special transportation or the assistance of another person to leave their place of residence. There is a normal inability to leave the home and doing so requires considerable and taxing effort. Other absences are for medical reasons / religious services and are infrequent or of short duration when for other reasons). ANNYE, FORREY ([id number]) Please direct any NON-WOUND related issues/requests for orders to patient's Primary Care Physician Wound #2 Right,Circumferential Lower Leg: [place] Visits - [place] Nurse may visit PRN to address patient s wound care needs. FACE TO FACE ENCOUNTER: MEDICARE and MEDICAID PATIENTS: I certify that this patient is under my care and that I had a face-to-face encounter that meets the physician face-to-face encounter requirements with this patient on this date. The encounter with the patient was in whole or in part for the following MEDICAL CONDITION: (primary reason for [place]) MEDICAL NECESSITY: I certify, that based on my findings, NURSING services are a medically necessary home health service. HOME BOUND STATUS: I certify that my clinical findings support that this patient is homebound (i.e., Due to illness or injury, pt requires aid of supportive devices such as crutches, cane, wheelchairs, walkers, the use of special transportation or the assistance of another person to leave their place of residence. There is a normal inability to leave the home and doing so requires considerable and taxing effort. Other absences are for medical reasons / religious services and are infrequent or of short duration when for other reasons). Please direct any NON-WOUND related issues/requests for orders to patient's Primary Care  Physician We will continue to use silver alginate on most of the wounds. The wounds on the posterior aspect of both legs have significant amount of subcutaneous debris and we will use Santyl ointment for these. Rest of the wounds washed out well with moist saline gauze and we can use silver alginate on them. have a Profore lite applied bilaterally. We will see her back next week. Electronic Signature(s) Signed: 09/20/2016 9:30:54 AM By: Brooke Fudge MD, FACS Entered By: Brooke Hopkins on 09/20/2016 09:30:54 Brooke Hopkins ([id number]) -------------------------------------------------------------------------------- SuperBill Details Patient Name: VARINA, HULON. Date of Service: 09/20/2016 Medical Record Number: [id number] Patient Account Number: [id number] Date of Birth/Sex: [birth year] (65 y.o. 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 E66.01 Morbid (severe) obesity due to excess calories B37.2 Candidiasis of skin and nail Facility Procedures CPT4 Code: [id number] Description: 22979 - DEB SUBQ TISSUE 20 SQ CM/< ICD-10 Description Diagnosis E11.622 Type 2 diabetes mellitus with other skin ulcer I89.0 Lymphedema, not elsewhere classified L97.222 Non-pressure chronic ulcer of left calf with fat l 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 Modifier: ayer exposed layer exposed Quantity: 1 CPT4 Code: 89211941 Description:  11045 - DEB SUBQ TISS EA ADDL 20CM ICD-10 Description Diagnosis E11.622 Type 2 diabetes mellitus with other  skin ulcer I89.0 Lymphedema, not elsewhere classified L97.222 Non-pressure chronic ulcer of left calf with fat l 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 Modifier: ayer exposed layer exposed Quantity: 1 Physician Procedures CPT4 Code: [id number] [place], LI Description: 11042 - WC PHYS SUBQ TISS 20 SQ CM ICD-10 Description Diagnosis E11.622 Type 2 diabetes mellitus with other skin ulcer I89.0 Lymphedema, not elsewhere classified NDA M. ([id number]) Modifier: Quantity: 1 Electronic Signature(s) Signed: 09/20/2016 9:31:11 AM By: Brooke Fudge MD, FACS Entered By: Brooke Hopkins on 09/20/2016 09:31:11

## 2016-09-21 NOTE — Progress Notes (Signed)
LAQUASHA, GROOME ([id number]) Visit Report for 09/20/2016 Arrival Information Details Patient Name: Brooke Hopkins, Brooke Hopkins. Date of Service: 09/20/2016 8:45 AM Medical Record Number: [id number] Patient Account Number: [id number] Date of Birth/Sex: 08-18-1951 (65 y.o. Female) Treating RN: Ahmed Prima Primary Care Physician: FITZGERALD, DAVID Other Clinician: Referring Physician: FITZGERALD, DAVID Treating Physician/Extender: Frann Rider in Treatment: 13 Visit Information History Since Last Visit All ordered tests and consults were completed: No Patient Arrived: Wheel Chair Added or deleted any medications: No Arrival Time: 08:47 Any new allergies or adverse reactions: No Accompanied By: son Had a fall or experienced change in No Transfer Assistance: [employer] activities of daily living that may affect Patient Lift risk of falls: Patient Identification Verified: Yes Signs or symptoms of abuse/neglect since last No Secondary Verification Process Yes visito Completed: Hospitalized since last visit: No Patient Requires Transmission- No Pain Present Now: No Based Precautions: Patient Has Alerts: No Electronic Signature(s) Signed: 09/20/2016 5:24:41 PM By: Alric Quan Entered By: Alric Quan on 09/20/2016 08:48:22 Brooke Hopkins ([id number]) -------------------------------------------------------------------------------- Encounter Discharge Information Details Patient Name: Brooke Hopkins, Brooke Hopkins. Date of Service: 09/20/2016 8:45 AM Medical Record Number: [id number] Patient Account Number: [id number] Date of Birth/Sex: 30-Nov-1951 (65 y.o. Female) Treating RN: Ahmed Prima Primary Care Physician: FITZGERALD, DAVID Other Clinician: Referring Physician: FITZGERALD, DAVID Treating Physician/Extender: Frann Rider in Treatment: 60 Encounter Discharge Information Items Discharge Pain Level: 0 Discharge Condition: Stable Ambulatory Status:  Wheelchair Discharge Destination: Home Transportation: Private Auto Accompanied By: son Schedule Follow-up Appointment: Yes Medication Reconciliation completed and provided to Patient/Care Yes Lyndon Chapel: Provided on Clinical Summary of Care: 09/20/2016 Form Type Recipient Paper Patient LB Electronic Signature(s) Signed: 09/20/2016 9:44:10 AM By: Ruthine Dose Entered By: Ruthine Dose on 09/20/2016 09:44:09 Brooke Hopkins ([id number]) -------------------------------------------------------------------------------- Lower Extremity Assessment Details Patient Name: Brooke Hopkins, Brooke Hopkins. Date of Service: 09/20/2016 8:45 AM Medical Record Number: [id number] Patient Account Number: [id number] Date of Birth/Sex: [birth year] (65 y.o. Female) Treating RN: Ahmed Prima Primary Care Physician: FITZGERALD, DAVID Other Clinician: Referring Physician: FITZGERALD, DAVID Treating Physician/Extender: Frann Rider in Treatment: 13 Edema Assessment Assessed: [Left: No] [Right: No] E[Left: dema] [Right: :] Calf Left: Right: Point of Measurement: 32 cm From Medial Instep 54.6 cm 53.5 cm Ankle Left: Right: Point of Measurement: 11 cm From Medial Instep 33 cm 31.5 cm Vascular Assessment Pulses: Posterior Tibial Dorsalis Pedis Palpable: [Left:Yes] [Right:Yes] Extremity colors, hair growth, and conditions: Extremity Color: [Left:Red] [Right:Red] Temperature of Extremity: [Left:Warm] [Right:Warm] Capillary Refill: [Left:< 3 seconds] [Right:< 3 seconds] Toe Nail Assessment Left: Right: Thick: Yes Yes Discolored: Yes Yes Deformed: No No Improper Length and Hygiene: Yes Yes Electronic Signature(s) Signed: 09/20/2016 5:24:41 PM By: Alric Quan Entered By: Alric Quan on 09/20/2016 08:59:08 Brooke Hopkins ([id number]) -------------------------------------------------------------------------------- Multi Wound Chart Details Patient Name: Brooke Hopkins. Date of  Service: 09/20/2016 8:45 AM Medical Record Number: [id number] Patient Account Number: [id number] Date of Birth/Sex: 1951/03/21 (65 y.o. Female) Treating RN: Ahmed Prima Primary Care Physician: FITZGERALD, DAVID Other Clinician: Referring Physician: FITZGERALD, DAVID Treating Physician/Extender: Frann Rider in Treatment: 13 Vital Signs Height(in): 67 Pulse(bpm): 73 Weight(lbs): 300 Blood Pressure 158/70 (mmHg): Body Mass Index(BMI): 47 Temperature(F): 97.8 Respiratory Rate 20 (breaths/min): Photos: [1:No Photos] [2:No Photos] [N/A:N/A] Wound Location: [1:Left Lower Leg - Circumfernential] [2:Right Lower Leg - Circumfernential] [N/A:N/A] Wounding Event: [1:Gradually Appeared] [2:Gradually Appeared] [N/A:N/A] Primary Etiology: [1:Lymphedema] [2:Lymphedema] [N/A:N/A] Date Acquired: [1:12/31/2015] [2:12/31/2015] [N/A:N/A] Weeks of Treatment: [1:13] [2:13] [N/A:N/A] Wound Status: [1:Open] [2:Open] [N/A:N/A] Clustered Wound: [1:No] [2:Yes] [N/A:N/A] Clustered Quantity: [  1:N/A] [2:3] [N/A:N/A] Measurements L x W x D 6x12x0.2 [2:5x9.5x0.2] [N/A:N/A] (cm) Area (cm) : [1:56.549] [2:37.306] [N/A:N/A] Volume (cm) : [1:11.31] [2:7.461] [N/A:N/A] % Reduction in Area: [1:43.70%] [2:83.50%] [N/A:N/A] % Reduction in Volume: 43.70% [2:83.50%] [N/A:N/A] Classification: [1:Partial Thickness] [2:Partial Thickness] [N/A:N/A] Exudate Amount: [1:Large] [2:Large] [N/A:N/A] Exudate Type: [1:Serous] [2:Serous] [N/A:N/A] Exudate Color: [1:amber] [2:amber] [N/A:N/A] Foul Odor After [1:Yes] [2:Yes] [N/A:N/A] Cleansing: Odor Anticipated Due to No [2:No] [N/A:N/A] Product Use: Wound Margin: [1:Flat and Intact] [2:Indistinct, nonvisible] [N/A:N/A] Granulation Amount: [1:None Present (0%)] [2:None Present (0%)] [N/A:N/A] Necrotic Amount: [1:Large (67-100%)] [2:Large (67-100%)] [N/A:N/A] Exposed Structures: [1:Fascia: No Fat: No Tendon: No] [2:Fascia: No Fat: No Tendon: No] [N/A:N/A] Muscle:  No Muscle: No Joint: No Joint: No Bone: No Bone: No Limited to Skin Limited to Skin Breakdown Breakdown Epithelialization: None Small (1-33%) N/A Periwound Skin Texture: Edema: Yes Edema: Yes N/A Scarring: Yes Scarring: Yes Excoriation: No Excoriation: No Induration: No Induration: No Callus: No Callus: No Crepitus: No Crepitus: No Fluctuance: No Fluctuance: No Friable: No Friable: No Rash: No Rash: No Periwound Skin Maceration: Yes Maceration: Yes N/A Moisture: Moist: Yes Moist: Yes Dry/Scaly: No Dry/Scaly: No Periwound Skin Color: Hemosiderin Staining: Yes Hemosiderin Staining: Yes N/A Atrophie Blanche: No Atrophie Blanche: No Cyanosis: No Cyanosis: No Ecchymosis: No Ecchymosis: No Erythema: No Erythema: No Mottled: No Mottled: No Pallor: No Pallor: No Rubor: No Rubor: No Temperature: No Abnormality No Abnormality N/A Tenderness on Yes Yes N/A Palpation: Wound Preparation: Ulcer Cleansing: Other: Ulcer Cleansing: Other: N/A soap and water soap and water Topical Anesthetic Topical Anesthetic Applied: Other: lidocaine Applied: Other: lidocaine 4% 4% Treatment Notes Electronic Signature(s) Signed: 09/20/2016 5:24:41 PM By: Alric Quan Entered By: Alric Quan on 09/20/2016 09:07:39 Brooke Hopkins ([id number]) -------------------------------------------------------------------------------- Grace Details Patient Name: Brooke Hopkins, Brooke Hopkins. Date of Service: 09/20/2016 8:45 AM Medical Record Number: [id number] Patient Account Number: [id number] Date of Birth/Sex: Sep 03, 1951 (65 y.o. Female) Treating RN: Ahmed Prima Primary Care Physician: FITZGERALD, DAVID Other Clinician: Referring Physician: FITZGERALD, DAVID Treating Physician/Extender: Frann Rider in Treatment: 65 Active Inactive Orientation to the Wound Care Program Nursing Diagnoses: Knowledge deficit related to the wound healing center  program Goals: Patient/caregiver will verbalize understanding of the [facility] Program Date Initiated: 06/21/2016 Goal Status: Active Interventions: Provide education on orientation to the wound center Notes: Wound/Skin Impairment Nursing Diagnoses: Impaired tissue integrity Goals: Patient/caregiver will verbalize understanding of skin care regimen Date Initiated: 06/21/2016 Goal Status: Active Ulcer/skin breakdown will have a volume reduction of 30% by week 4 Date Initiated: 06/21/2016 Goal Status: Active Ulcer/skin breakdown will have a volume reduction of 50% by week 8 Date Initiated: 06/21/2016 Goal Status: Active Ulcer/skin breakdown will have a volume reduction of 80% by week 12 Date Initiated: 06/21/2016 Goal Status: Active Ulcer/skin breakdown will heal within 14 weeks Date Initiated: 06/21/2016 Goal Status: Active NYRAH, DEMOS ([id number]) Interventions: Assess patient/caregiver ability to obtain necessary supplies Assess patient/caregiver ability to perform ulcer/skin care regimen upon admission and as needed Assess ulceration(s) every visit Provide education on ulcer and skin care Notes: Electronic Signature(s) Signed: 09/20/2016 5:24:41 PM By: Alric Quan Entered By: Alric Quan on 09/20/2016 09:07:34 Brooke Hopkins ([id number]) -------------------------------------------------------------------------------- Pain Assessment Details Patient Name: Brooke Hopkins, Brooke Hopkins. Date of Service: 09/20/2016 8:45 AM Medical Record Number: [id number] Patient Account Number: [id number] Date of Birth/Sex: 07/03/1951 (65 y.o. Female) Treating RN: Ahmed Prima Primary Care Physician: FITZGERALD, DAVID Other Clinician: Referring Physician: FITZGERALD, DAVID Treating Physician/Extender: Frann Rider in Treatment: 13 Active Problems  Location of Pain Severity and Description of Pain Patient Has Paino No Site Locations With Dressing Change:  No Pain Management and Medication Current Pain Management: Electronic Signature(s) Signed: 09/20/2016 5:24:41 PM By: Alric Quan Entered By: Alric Quan on 09/20/2016 08:48:28 Brooke Hopkins ([id number]) -------------------------------------------------------------------------------- Patient/Caregiver Education Details Patient Name: Brooke Hopkins, Brooke Hopkins. Date of Service: 09/20/2016 8:45 AM Medical Record Number: [id number] Patient Account Number: [id number] Date of Birth/Gender: 1951/07/23 (65 y.o. Female) Treating RN: Ahmed Prima Primary Care Physician: FITZGERALD, DAVID Other Clinician: Referring Physician: FITZGERALD, DAVID Treating Physician/Extender: Frann Rider in Treatment: 13 Education Assessment Education Provided To: Patient Education Topics Provided Wound/Skin Impairment: Handouts: Other: change dressing as ordered and do not get dressing wet Methods: Demonstration, Explain/Verbal Responses: State content correctly Electronic Signature(s) Signed: 09/20/2016 5:24:41 PM By: Alric Quan Entered By: Alric Quan on 09/20/2016 09:09:48 Brooke Hopkins ([id number]) -------------------------------------------------------------------------------- Wound Assessment Details Patient Name: Brooke Hopkins, Brooke Hopkins. Date of Service: 09/20/2016 8:45 AM Medical Record Number: [id number] Patient Account Number: [id number] Date of Birth/Sex: 1951-11-25 (65 y.o. Female) Treating RN: Ahmed Prima Primary Care Physician: FITZGERALD, DAVID Other Clinician: Referring Physician: FITZGERALD, DAVID Treating Physician/Extender: Frann Rider in Treatment: 13 Wound Status Wound Number: 1 Primary Etiology: Lymphedema Wound Location: Left Lower Leg - Wound Status: Open Circumfernential Wounding Event: Gradually Appeared Date Acquired: 12/31/2015 Weeks Of Treatment: 13 Clustered Wound: No Photos Photo Uploaded By: Alric Quan on 09/20/2016  11:56:29 Wound Measurements Length: (cm) 6 Width: (cm) 12 Depth: (cm) 0.2 Area: (cm) 56.549 Volume: (cm) 11.31 % Reduction in Area: 43.7% % Reduction in Volume: 43.7% Epithelialization: None Tunneling: No Undermining: No Wound Description Classification: Partial Thickness Wound Margin: Flat and Intact Exudate Amount: Large Exudate Type: Serous Exudate Color: amber Foul Odor After Cleansing: Yes Due to Product Use: No Wound Bed Granulation Amount: None Present (0%) Exposed Structure Necrotic Amount: Large (67-100%) Fascia Exposed: No Necrotic Quality: Adherent Slough Fat Layer Exposed: No Brooke Hopkins, Brooke Hopkins ([id number]) Tendon Exposed: No Muscle Exposed: No Joint Exposed: No Bone Exposed: No Limited to Skin Breakdown Periwound Skin Texture Texture Color No Abnormalities Noted: No No Abnormalities Noted: No Callus: No Atrophie Blanche: No Crepitus: No Cyanosis: No Excoriation: No Ecchymosis: No Fluctuance: No Erythema: No Friable: No Hemosiderin Staining: Yes Induration: No Mottled: No Localized Edema: Yes Pallor: No Rash: No Rubor: No Scarring: Yes Temperature / Pain Moisture Temperature: No Abnormality No Abnormalities Noted: No Tenderness on Palpation: Yes Dry / Scaly: No Maceration: Yes Moist: Yes Wound Preparation Ulcer Cleansing: Other: soap and water, Topical Anesthetic Applied: Other: lidocaine 4%, Treatment Notes Wound #1 (Left, Circumferential Lower Leg) 1. Cleansed with: Cleanse wound with antibacterial soap and water 2. Anesthetic Topical Lidocaine 4% cream to wound bed prior to debridement 4. Dressing Applied: Aquacel Ag Santyl Ointment 5. Secondary Dressing Applied ABD Pad Dry Gauze 7. Secured with Tape 3 Layer Compression System - Bilateral Electronic Signature(s) Signed: 09/20/2016 5:24:41 PM By: Tedra Coupe ([id number]) Entered By: Alric Quan on 09/20/2016 09:05:31 Brooke Hopkins, Brooke Hopkins  ([id number]) -------------------------------------------------------------------------------- Wound Assessment Details Patient Name: Brooke Hopkins, Brooke Hopkins. Date of Service: 09/20/2016 8:45 AM Medical Record Number: [id number] Patient Account Number: [id number] Date of Birth/Sex: 10-Oct-1951 (65 y.o. Female) Treating RN: Ahmed Prima Primary Care Physician: FITZGERALD, DAVID Other Clinician: Referring Physician: FITZGERALD, DAVID Treating Physician/Extender: Frann Rider in Treatment: 13 Wound Status Wound Number: 2 Primary Etiology: Lymphedema Wound Location: Right Lower Leg - Wound Status: Open Circumfernential Wounding Event: Gradually Appeared Date Acquired: 12/31/2015 Weeks Of Treatment: 13 Clustered  Wound: Yes Photos Photo Uploaded By: Alric Quan on 09/20/2016 11:56:29 Wound Measurements Length: (cm) 5 Width: (cm) 9.5 Depth: (cm) 0.3 Clustered Quantity: 3 Area: (cm) 37.306 Volume: (cm) 11.192 % Reduction in Area: 83.5% % Reduction in Volume: 75.3% Epithelialization: Small (1-33%) Tunneling: No Undermining: No Wound Description Classification: Partial Thickness Wound Margin: Indistinct, nonvisible Exudate Amount: Large Exudate Type: Serous Exudate Color: amber Foul Odor After Cleansing: Yes Due to Product Use: No Wound Bed Granulation Amount: None Present (0%) Exposed Structure Necrotic Amount: Large (67-100%) Fascia Exposed: No Brooke Hopkins, Brooke Hopkins ([id number]) Necrotic Quality: Adherent Slough Fat Layer Exposed: No Tendon Exposed: No Muscle Exposed: No Joint Exposed: No Bone Exposed: No Limited to Skin Breakdown Periwound Skin Texture Texture Color No Abnormalities Noted: No No Abnormalities Noted: No Callus: No Atrophie Blanche: No Crepitus: No Cyanosis: No Excoriation: No Ecchymosis: No Fluctuance: No Erythema: No Friable: No Hemosiderin Staining: Yes Induration: No Mottled: No Localized Edema: Yes Pallor: No Rash: No Rubor:  No Scarring: Yes Temperature / Pain Moisture Temperature: No Abnormality No Abnormalities Noted: No Tenderness on Palpation: Yes Dry / Scaly: No Maceration: Yes Moist: Yes Wound Preparation Ulcer Cleansing: Other: soap and water, Topical Anesthetic Applied: Other: lidocaine 4%, Treatment Notes Wound #2 (Right, Circumferential Lower Leg) 1. Cleansed with: Cleanse wound with antibacterial soap and water 2. Anesthetic Topical Lidocaine 4% cream to wound bed prior to debridement 4. Dressing Applied: Aquacel Ag Santyl Ointment 5. Secondary Dressing Applied ABD Pad Dry Gauze 7. Secured with Tape 3 Layer Compression System - Bilateral Electronic Signature(s) Signed: 09/20/2016 5:24:41 PM By: Tedra Coupe ([id number]) Entered By: Alric Quan on 09/20/2016 09:11:19 Brooke Hopkins ([id number]) -------------------------------------------------------------------------------- [place] Details Patient Name: Brooke Hopkins, Brooke Hopkins. Date of Service: 09/20/2016 8:45 AM Medical Record Number: [id number] Patient Account Number: [id number] Date of Birth/Sex: [birth year] (65 y.o. Female) Treating RN: Ahmed Prima Primary Care Physician: FITZGERALD, DAVID Other Clinician: Referring Physician: FITZGERALD, DAVID Treating Physician/Extender: Frann Rider in Treatment: 13 Vital Signs Time Taken: 08:48 Temperature (F): 97.8 Height (in): 67 Pulse (bpm): 73 Weight (lbs): 300 Respiratory Rate (breaths/min): 20 Body Mass Index (BMI): 47 Blood Pressure (mmHg): 158/70 Reference Range: 80 - 120 mg / dl Electronic Signature(s) Signed: 09/20/2016 5:24:41 PM By: Alric Quan Entered By: Alric Quan on 09/20/2016 08:51:32

## 2016-09-27 ENCOUNTER — Encounter: Payer: 59 | Admitting: Surgery

## 2016-09-27 DIAGNOSIS — L97822 Non-pressure chronic ulcer of other part of left lower leg with fat layer exposed: Secondary | ICD-10-CM | POA: Diagnosis not present

## 2016-09-27 DIAGNOSIS — E11622 Type 2 diabetes mellitus with other skin ulcer: Secondary | ICD-10-CM | POA: Diagnosis not present

## 2016-09-27 DIAGNOSIS — L97812 Non-pressure chronic ulcer of other part of right lower leg with fat layer exposed: Secondary | ICD-10-CM | POA: Diagnosis not present

## 2016-09-27 DIAGNOSIS — I890 Lymphedema, not elsewhere classified: Secondary | ICD-10-CM | POA: Diagnosis not present

## 2016-09-28 NOTE — Progress Notes (Signed)
NAKEDA, LEBRON ([id number]) Visit Report for 09/27/2016 Arrival Information Details Patient Name: Brooke Hopkins, Brooke Hopkins. Date of Service: 09/27/2016 8:45 AM Medical Record Number: [id number] Patient Account Number: [id number] Date of Birth/Sex: 1951-09-20 (65 y.o. Female) Treating RN: Ahmed Prima Primary Care Physician: FITZGERALD, DAVID Other Clinician: Referring Physician: FITZGERALD, DAVID Treating Physician/Extender: Frann Rider in Treatment: 14 Visit Information History Since Last Visit All ordered tests and consults were completed: No Patient Arrived: Wheel Chair Added or deleted any medications: No Arrival Time: 08:55 Any new allergies or adverse reactions: No Accompanied By: husband Had a fall or experienced change in No Transfer Assistance: [employer] activities of daily living that may affect Patient Lift risk of falls: Patient Identification Verified: Yes Signs or symptoms of abuse/neglect since last No Secondary Verification Process Yes visito Completed: Hospitalized since last visit: No Patient Requires Transmission- No Pain Present Now: No Based Precautions: Patient Has Alerts: No Electronic Signature(s) Signed: 09/27/2016 4:03:29 PM By: Alric Quan Entered By: Alric Quan on 09/27/2016 08:55:19 Brooke Hopkins ([id number]) -------------------------------------------------------------------------------- Encounter Discharge Information Details Patient Name: Brooke Hopkins, Brooke Hopkins. Date of Service: 09/27/2016 8:45 AM Medical Record Number: [id number] Patient Account Number: [id number] Date of Birth/Sex: Mar 23, 1951 (65 y.o. Female) Treating RN: Ahmed Prima Primary Care Physician: FITZGERALD, DAVID Other Clinician: Referring Physician: FITZGERALD, DAVID Treating Physician/Extender: Frann Rider in Treatment: 39 Encounter Discharge Information Items Discharge Pain Level: 0 Discharge Condition: Stable Ambulatory Status:  Wheelchair Discharge Destination: Home Transportation: Private Auto Accompanied By: husband Schedule Follow-up Appointment: Yes Medication Reconciliation completed and provided to Patient/Care Yes Brent Noto: Provided on Clinical Summary of Care: 09/27/2016 Form Type Recipient Paper Patient LB Electronic Signature(s) Signed: 09/27/2016 9:55:07 AM By: Ruthine Dose Entered By: Ruthine Dose on 09/27/2016 09:55:06 Brooke Hopkins ([id number]) -------------------------------------------------------------------------------- Lower Extremity Assessment Details Patient Name: Brooke Hopkins, Brooke Hopkins. Date of Service: 09/27/2016 8:45 AM Medical Record Number: [id number] Patient Account Number: [id number] Date of Birth/Sex: [birth year] (65 y.o. Female) Treating RN: Ahmed Prima Primary Care Physician: FITZGERALD, DAVID Other Clinician: Referring Physician: FITZGERALD, DAVID Treating Physician/Extender: Frann Rider in Treatment: 14 Edema Assessment Assessed: [Left: No] [Right: No] E[Left: dema] [Right: :] Calf Left: Right: Point of Measurement: 32 cm From Medial Instep 53.4 cm 54 cm Ankle Left: Right: Point of Measurement: 11 cm From Medial Instep 33.5 cm 32 cm Vascular Assessment Pulses: Posterior Tibial Dorsalis Pedis Palpable: [Left:Yes] [Right:Yes] Extremity colors, hair growth, and conditions: Extremity Color: [Left:Red] [Right:Red] Temperature of Extremity: [Left:Warm] [Right:Warm] Capillary Refill: [Left:< 3 seconds] [Right:< 3 seconds] Toe Nail Assessment Left: Right: Thick: Yes Yes Discolored: Yes Yes Deformed: No No Improper Length and Hygiene: Yes Yes Electronic Signature(s) Signed: 09/27/2016 4:03:29 PM By: Alric Quan Entered By: Alric Quan on 09/27/2016 09:03:39 Brooke Hopkins ([id number]) -------------------------------------------------------------------------------- Multi Wound Chart Details Patient Name: Brooke Hopkins. Date of  Service: 09/27/2016 8:45 AM Medical Record Number: [id number] Patient Account Number: [id number] Date of Birth/Sex: 1951/11/14 (65 y.o. Female) Treating RN: Montey Hora Primary Care Physician: FITZGERALD, DAVID Other Clinician: Referring Physician: FITZGERALD, DAVID Treating Physician/Extender: Frann Rider in Treatment: 14 Vital Signs Height(in): 67 Pulse(bpm): 68 Weight(lbs): 300 Blood Pressure 157/64 (mmHg): Body Mass Index(BMI): 47 Temperature(F): 98.2 Respiratory Rate 20 (breaths/min): Photos: [1:No Photos] [2:No Photos] [N/A:N/A] Wound Location: [1:Left Lower Leg - Circumfernential] [2:Right Lower Leg - Circumfernential] [N/A:N/A] Wounding Event: [1:Gradually Appeared] [2:Gradually Appeared] [N/A:N/A] Primary Etiology: [1:Lymphedema] [2:Lymphedema] [N/A:N/A] Date Acquired: [1:12/31/2015] [2:12/31/2015] [N/A:N/A] Weeks of Treatment: [1:14] [2:14] [N/A:N/A] Wound Status: [1:Open] [2:Open] [N/A:N/A] Clustered Wound: [1:No] [2:Yes] [N/A:N/A] Clustered Quantity: [  1:N/A] [2:3] [N/A:N/A] Measurements L x W x D 6x12x0.2 [2:5x9.5x0.3] [N/A:N/A] (cm) Area (cm) : [1:56.549] [2:37.306] [N/A:N/A] Volume (cm) : [1:11.31] [2:11.192] [N/A:N/A] % Reduction in Area: [1:43.70%] [2:83.50%] [N/A:N/A] % Reduction in Volume: 43.70% [2:75.30%] [N/A:N/A] Classification: [1:Partial Thickness] [2:Partial Thickness] [N/A:N/A] Exudate Amount: [1:Large] [2:Large] [N/A:N/A] Exudate Type: [1:Serosanguineous] [2:Serosanguineous] [N/A:N/A] Exudate Color: [1:red, brown] [2:red, brown] [N/A:N/A] Foul Odor After [1:Yes] [2:Yes] [N/A:N/A] Cleansing: Odor Anticipated Due to No [2:No] [N/A:N/A] Product Use: Wound Margin: [1:Flat and Intact] [2:Indistinct, nonvisible] [N/A:N/A] Granulation Amount: [1:None Present (0%)] [2:None Present (0%)] [N/A:N/A] Necrotic Amount: [1:Large (67-100%)] [2:Large (67-100%)] [N/A:N/A] Exposed Structures: [1:Fascia: No Fat: No Tendon: No] [2:Fascia: No Fat: No  Tendon: No] [N/A:N/A] Muscle: No Muscle: No Joint: No Joint: No Bone: No Bone: No Limited to Skin Limited to Skin Breakdown Breakdown Epithelialization: None Small (1-33%) N/A Periwound Skin Texture: Edema: Yes Edema: Yes N/A Scarring: Yes Scarring: Yes Excoriation: No Excoriation: No Induration: No Induration: No Callus: No Callus: No Crepitus: No Crepitus: No Fluctuance: No Fluctuance: No Friable: No Friable: No Rash: No Rash: No Periwound Skin Maceration: Yes Maceration: Yes N/A Moisture: Moist: Yes Moist: Yes Dry/Scaly: No Dry/Scaly: No Periwound Skin Color: Hemosiderin Staining: Yes Hemosiderin Staining: Yes N/A Atrophie Blanche: No Atrophie Blanche: No Cyanosis: No Cyanosis: No Ecchymosis: No Ecchymosis: No Erythema: No Erythema: No Mottled: No Mottled: No Pallor: No Pallor: No Rubor: No Rubor: No Temperature: No Abnormality No Abnormality N/A Tenderness on Yes Yes N/A Palpation: Wound Preparation: Ulcer Cleansing: Other: Ulcer Cleansing: Other: N/A soap and water soap and water Topical Anesthetic Topical Anesthetic Applied: Other: lidocaine Applied: Other: lidocaine 4% 4% Treatment Notes Electronic Signature(s) Signed: 09/27/2016 4:28:24 PM By: Montey Hora Entered By: Montey Hora on 09/27/2016 09:29:26 Brooke Hopkins ([id number]) -------------------------------------------------------------------------------- Multi-Disciplinary Care Plan Details Patient Name: Brooke Hopkins, Brooke Hopkins. Date of Service: 09/27/2016 8:45 AM Medical Record Number: [id number] Patient Account Number: [id number] Date of Birth/Sex: 26-Jul-1951 (65 y.o. Female) Treating RN: Montey Hora Primary Care Physician: FITZGERALD, DAVID Other Clinician: Referring Physician: FITZGERALD, DAVID Treating Physician/Extender: Frann Rider in Treatment: 14 Active Inactive Orientation to the Wound Care Program Nursing Diagnoses: Knowledge deficit related to the wound  healing center program Goals: Patient/caregiver will verbalize understanding of the [facility] Date Initiated: 06/21/2016 Goal Status: Active Interventions: Provide education on orientation to the wound center Notes: Wound/Skin Impairment Nursing Diagnoses: Impaired tissue integrity Goals: Patient/caregiver will verbalize understanding of skin care regimen Date Initiated: 06/21/2016 Goal Status: Active Ulcer/skin breakdown will have a volume reduction of 30% by week 4 Date Initiated: 06/21/2016 Goal Status: Active Ulcer/skin breakdown will have a volume reduction of 50% by week 8 Date Initiated: 06/21/2016 Goal Status: Active Ulcer/skin breakdown will have a volume reduction of 80% by week 12 Date Initiated: 06/21/2016 Goal Status: Active Ulcer/skin breakdown will heal within 14 weeks Date Initiated: 06/21/2016 Goal Status: Active Brooke Hopkins, Brooke Hopkins ([id number]) Interventions: Assess patient/caregiver ability to obtain necessary supplies Assess patient/caregiver ability to perform ulcer/skin care regimen upon admission and as needed Assess ulceration(s) every visit Provide education on ulcer and skin care Notes: Electronic Signature(s) Signed: 09/27/2016 4:28:24 PM By: Montey Hora Entered By: Montey Hora on 09/27/2016 09:29:17 Brooke Hopkins ([id number]) -------------------------------------------------------------------------------- Pain Assessment Details Patient Name: Brooke Hopkins, Brooke Hopkins. Date of Service: 09/27/2016 8:45 AM Medical Record Number: [id number] Patient Account Number: [id number] Date of Birth/Sex: 24-Jan-1951 (65 y.o. Female) Treating RN: Ahmed Prima Primary Care Physician: FITZGERALD, DAVID Other Clinician: Referring Physician: FITZGERALD, DAVID Treating Physician/Extender: Frann Rider in Treatment: 48  Active Problems Location of Pain Severity and Description of Pain Patient Has Paino No Site Locations With Dressing  Change: No Pain Management and Medication Current Pain Management: Electronic Signature(s) Signed: 09/27/2016 4:03:29 PM By: Alric Quan Entered By: Alric Quan on 09/27/2016 08:55:26 Brooke Hopkins ([id number]) -------------------------------------------------------------------------------- Patient/Caregiver Education Details Patient Name: Brooke Hopkins, Brooke Hopkins. Date of Service: 09/27/2016 8:45 AM Medical Record Number: [id number] Patient Account Number: [id number] Date of Birth/Gender: Mar 10, 1951 (65 y.o. Female) Treating RN: Ahmed Prima Primary Care Physician: FITZGERALD, DAVID Other Clinician: Referring Physician: FITZGERALD, DAVID Treating Physician/Extender: Frann Rider in Treatment: 14 Education Assessment Education Provided To: Patient Education Topics Provided Wound/Skin Impairment: Handouts: Other: change dressing as ordered and do not get dressing wet Methods: Demonstration, Explain/Verbal Responses: State content correctly Electronic Signature(s) Signed: 09/27/2016 4:03:29 PM By: Alric Quan Entered By: Alric Quan on 09/27/2016 09:25:23 Brooke Hopkins ([id number]) -------------------------------------------------------------------------------- Wound Assessment Details Patient Name: Brooke Hopkins, Brooke Hopkins. Date of Service: 09/27/2016 8:45 AM Medical Record Number: [id number] Patient Account Number: [id number] Date of Birth/Sex: 10-Aug-1951 (65 y.o. Female) Treating RN: Ahmed Prima Primary Care Physician: FITZGERALD, DAVID Other Clinician: Referring Physician: FITZGERALD, DAVID Treating Physician/Extender: Frann Rider in Treatment: 14 Wound Status Wound Number: 1 Primary Etiology: Lymphedema Wound Location: Left Lower Leg - Wound Status: Open Circumfernential Wounding Event: Gradually Appeared Date Acquired: 12/31/2015 Weeks Of Treatment: 14 Clustered Wound: No Photos Photo Uploaded By: Alric Quan on 09/27/2016  10:27:22 Wound Measurements Length: (cm) 6 Width: (cm) 12 Depth: (cm) 0.2 Area: (cm) 56.549 Volume: (cm) 11.31 % Reduction in Area: 43.7% % Reduction in Volume: 43.7% Epithelialization: None Tunneling: No Undermining: No Wound Description Classification: Partial Thickness Wound Margin: Flat and Intact Exudate Amount: Large Exudate Type: Serosanguineous Exudate Color: red, brown Foul Odor After Cleansing: Yes Due to Product Use: No Wound Bed Granulation Amount: None Present (0%) Exposed Structure Necrotic Amount: Large (67-100%) Fascia Exposed: No Necrotic Quality: Adherent Slough Fat Layer Exposed: No Brooke Hopkins, Brooke Hopkins ([id number]) Tendon Exposed: No Muscle Exposed: No Joint Exposed: No Bone Exposed: No Limited to Skin Breakdown Periwound Skin Texture Texture Color No Abnormalities Noted: No No Abnormalities Noted: No Callus: No Atrophie Blanche: No Crepitus: No Cyanosis: No Excoriation: No Ecchymosis: No Fluctuance: No Erythema: No Friable: No Hemosiderin Staining: Yes Induration: No Mottled: No Localized Edema: Yes Pallor: No Rash: No Rubor: No Scarring: Yes Temperature / Pain Moisture Temperature: No Abnormality No Abnormalities Noted: No Tenderness on Palpation: Yes Dry / Scaly: No Maceration: Yes Moist: Yes Wound Preparation Ulcer Cleansing: Other: soap and water, Topical Anesthetic Applied: Other: lidocaine 4%, Treatment Notes Wound #1 (Left, Circumferential Lower Leg) 1. Cleansed with: Cleanse wound with antibacterial soap and water 2. Anesthetic Topical Lidocaine 4% cream to wound bed prior to debridement 4. Dressing Applied: Aquacel Ag Santyl Ointment 5. Secondary Dressing Applied ABD Pad Dry Gauze 7. Secured with Tape 3 Layer Compression System - Bilateral Notes unna to anchor Brooke Hopkins, Brooke Hopkins ([id number]) Electronic Signature(s) Signed: 09/27/2016 4:03:29 PM By: Alric Quan Entered By: Alric Quan on  09/27/2016 09:08:40 Brooke Hopkins ([id number]) -------------------------------------------------------------------------------- Wound Assessment Details Patient Name: Brooke Hopkins, Brooke Hopkins. Date of Service: 09/27/2016 8:45 AM Medical Record Number: [id number] Patient Account Number: [id number] Date of Birth/Sex: 24-Jul-1951 (65 y.o. Female) Treating RN: Ahmed Prima Primary Care Physician: FITZGERALD, DAVID Other Clinician: Referring Physician: FITZGERALD, DAVID Treating Physician/Extender: Frann Rider in Treatment: 14 Wound Status Wound Number: 2 Primary Etiology: Lymphedema Wound Location: Right Lower Leg - Wound Status: Open Circumfernential Wounding Event: Gradually Appeared Date  Acquired: 12/31/2015 Weeks Of Treatment: 14 Clustered Wound: Yes Photos Photo Uploaded By: Alric Quan on 09/27/2016 10:27:23 Wound Measurements Length: (cm) 5 Width: (cm) 9.5 Depth: (cm) 0.3 Clustered Quantity: 3 Area: (cm) 37.306 Volume: (cm) 11.192 % Reduction in Area: 83.5% % Reduction in Volume: 75.3% Epithelialization: Small (1-33%) Tunneling: No Undermining: No Wound Description Classification: Partial Thickness Wound Margin: Indistinct, nonvisible Exudate Amount: Large Exudate Type: Serosanguineous Exudate Color: red, brown Foul Odor After Cleansing: Yes Due to Product Use: No Wound Bed Granulation Amount: None Present (0%) Exposed Structure Necrotic Amount: Large (67-100%) Fascia Exposed: No MYLIAH, MEDEL ([id number]) Necrotic Quality: Adherent Slough Fat Layer Exposed: No Tendon Exposed: No Muscle Exposed: No Joint Exposed: No Bone Exposed: No Limited to Skin Breakdown Periwound Skin Texture Texture Color No Abnormalities Noted: No No Abnormalities Noted: No Callus: No Atrophie Blanche: No Crepitus: No Cyanosis: No Excoriation: No Ecchymosis: No Fluctuance: No Erythema: No Friable: No Hemosiderin Staining: Yes Induration: No Mottled:  No Localized Edema: Yes Pallor: No Rash: No Rubor: No Scarring: Yes Temperature / Pain Moisture Temperature: No Abnormality No Abnormalities Noted: No Tenderness on Palpation: Yes Dry / Scaly: No Maceration: Yes Moist: Yes Wound Preparation Ulcer Cleansing: Other: soap and water, Topical Anesthetic Applied: Other: lidocaine 4%, Treatment Notes Wound #2 (Right, Circumferential Lower Leg) 1. Cleansed with: Cleanse wound with antibacterial soap and water 2. Anesthetic Topical Lidocaine 4% cream to wound bed prior to debridement 4. Dressing Applied: Aquacel Ag Santyl Ointment 5. Secondary Dressing Applied ABD Pad Dry Gauze 7. Secured with Tape 3 Layer Compression System - Bilateral Notes unna to anchor ERIKA, SLABY ([id number]) Electronic Signature(s) Signed: 09/27/2016 4:03:29 PM By: Alric Quan Entered By: Alric Quan on 09/27/2016 09:08:57 Brooke Hopkins ([id number]) -------------------------------------------------------------------------------- [place] Details Patient Name: KATELAN, HIRT. Date of Service: 09/27/2016 8:45 AM Medical Record Number: [id number] Patient Account Number: [id number] Date of Birth/Sex: November 24, 1951 (65 y.o. Female) Treating RN: Ahmed Prima Primary Care Physician: FITZGERALD, DAVID Other Clinician: Referring Physician: FITZGERALD, DAVID Treating Physician/Extender: Frann Rider in Treatment: 14 Vital Signs Time Taken: 08:55 Temperature (F): 98.2 Height (in): 67 Pulse (bpm): 68 Weight (lbs): 300 Respiratory Rate (breaths/min): 20 Body Mass Index (BMI): 47 Blood Pressure (mmHg): 157/64 Reference Range: 80 - 120 mg / dl Electronic Signature(s) Signed: 09/27/2016 4:03:29 PM By: Alric Quan Entered By: Alric Quan on 09/27/2016 08:55:43

## 2016-09-28 NOTE — Progress Notes (Signed)
Brooke, Hopkins ([id number]) Visit Report for 09/27/2016 Chief Complaint Document Details Patient Name: Brooke Hopkins, Brooke Hopkins. Date of Service: 09/27/2016 8:45 AM Medical Record Number: [id number] Patient Account Number: [id number] Date of Birth/Sex: September 21, 1951 (65 y.o. Female) Treating RN: Ahmed Prima Primary Care Physician: FITZGERALD, DAVID Other Clinician: Referring Physician: FITZGERALD, DAVID Treating Physician/Extender: Frann Rider in Treatment: 14 Information Obtained from: Patient Chief Complaint Patients presents for treatment of an open diabetic ulcer and significant lymphedema which she's had for about two years Electronic Signature(s) Signed: 09/27/2016 9:38:52 AM By: Christin Fudge MD, FACS Entered By: Christin Fudge on 09/27/2016 09:38:52 [name] ([id number]) -------------------------------------------------------------------------------- Debridement Details Patient Name: Brooke, Hopkins. Date of Service: 09/27/2016 8:45 AM Medical Record Number: [id number] Patient Account Number: [id number] Date of Birth/Sex: August 30, 1951 (65 y.o. Female) Treating RN: Ahmed Prima Primary Care Physician: FITZGERALD, DAVID Other Clinician: Referring Physician: FITZGERALD, DAVID Treating Physician/Extender: Frann Rider in Treatment: 14 Debridement Performed for Wound #1 Left,Circumferential Lower Leg Assessment: Performed By: Physician Christin Fudge, MD Debridement: Debridement Pre-procedure Yes - 09:28 Verification/Time Out Taken: Start Time: 09:28 Pain Control: Lidocaine 4% Topical Solution Level: Skin/Subcutaneous Tissue Total Area Debrided (L x 3 (cm) x 4 (cm) = 12 (cm) W): Tissue and other Viable, Non-Viable, Fibrin/Slough, Subcutaneous material debrided: Instrument: Curette Bleeding: Minimum Hemostasis Achieved: Pressure End Time: 09:30 Procedural Pain: 0 Post Procedural Pain: 0 Response to Treatment: Procedure was tolerated  well Post Debridement Measurements of Total Wound Length: (cm) 6 Width: (cm) 12 Depth: (cm) 0.2 Volume: (cm) 11.31 Character of Wound/Ulcer Post Improved Debridement: Severity of Tissue Post Debridement: Fat layer exposed Post Procedure Diagnosis Same as Pre-procedure Electronic Signature(s) Signed: 09/27/2016 9:38:36 AM By: Christin Fudge MD, FACS Signed: 09/27/2016 4:03:29 PM By: Alric Quan Entered By: Christin Fudge on 09/27/2016 09:38:36 [name] ([id number]) [name], [name] ([id number]) -------------------------------------------------------------------------------- Debridement Details Patient Name: Brooke, Hopkins. Date of Service: 09/27/2016 8:45 AM Medical Record Number: [id number] Patient Account Number: [id number] Date of Birth/Sex: [birth year] (65 y.o. Female) Treating RN: Ahmed Prima Primary Care Physician: FITZGERALD, DAVID Other Clinician: Referring Physician: FITZGERALD, DAVID Treating Physician/Extender: Frann Rider in Treatment: 14 Debridement Performed for Wound #2 Right,Circumferential Lower Leg Assessment: Performed By: Physician Christin Fudge, MD Debridement: Debridement Pre-procedure Yes - 09:30 Verification/Time Out Taken: Start Time: 09:30 Pain Control: Lidocaine 4% Topical Solution Level: Skin/Subcutaneous Tissue Total Area Debrided (L x 1.5 (cm) x 1.5 (cm) = 2.25 (cm) W): Tissue and other Viable, Non-Viable, Fibrin/Slough, Subcutaneous material debrided: Instrument: Curette Bleeding: Minimum Hemostasis Achieved: Pressure End Time: 09:32 Procedural Pain: 0 Post Procedural Pain: 0 Response to Treatment: Procedure was tolerated well Post Debridement Measurements of Total Wound Length: (cm) 5 Width: (cm) 9.5 Depth: (cm) 0.3 Volume: (cm) 11.192 Character of Wound/Ulcer Post Improved Debridement: Severity of Tissue Post Debridement: Fat layer exposed Post Procedure Diagnosis Same as  Pre-procedure Electronic Signature(s) Signed: 09/27/2016 9:38:44 AM By: Christin Fudge MD, FACS Signed: 09/27/2016 4:03:29 PM By: Alric Quan Entered By: Christin Fudge on 09/27/2016 09:38:44 [name] ([id number]) [name], [name] ([id number]) -------------------------------------------------------------------------------- HPI Details Patient Name: Brooke, Hopkins. Date of Service: 09/27/2016 8:45 AM Medical Record Number: [id number] Patient Account Number: [id number] Date of Birth/Sex: 1951-09-09 (65 y.o. Female) Treating RN: Ahmed Prima Primary Care Physician: FITZGERALD, DAVID Other Clinician: Referring Physician: FITZGERALD, DAVID Treating Physician/Extender: Frann Rider in Treatment: 14 History of Present Illness Location: massive swelling of both lower extremities and ulceration both lower extremities Quality: Patient reports experiencing a dull pain to affected area(s).  Severity: Patient states wound are getting worse. Duration: Patient has had the wound for >2 years prior to seeking treatment at the wound center Timing: Pain in wound is constant (hurts all the time) Context: The wound appeared gradually over time Modifying Factors: Other treatment(s) tried include:she has a lymphedema pump but uses it seldom and she's had several course of antibiotics Associated Signs and Symptoms: Patient reports having difficulty standing for long periods. HPI Description: 65 year old patient seen by Dr. Ola Spurr of infectious disease who has been following up for left lower extremity cellulitis and ulcer with recurrent bilateral lower extremity problems for several months. Recently she had a large right lower extremity bullae which opened out and has been ulcerated. She has seen the vascular group and has been getting Unna's wraps and has a lymphedema pump used in the past. Her prior cultures were positive for Pseudomonas, Proteus and was treated with  amoxicillin. He has also been treated with 2 weeks course of ciprofloxacin and amoxicillin.. Increase of Lasix dose helped with the edema and echo showed no systolic CHF but may be diastolic problems. Past medical history significant for diabetes mellitus type 2, venous stasis ulcer, obesity, diabetic peripheral neuropathy, status post back surgery, cholecystectomy, hysterectomy, arthroscopy of the knee. He is a former smoker and quit smoking in 1984. The patient has seen Dr. Delana Meyer who did not recommend any arterial or venous duplex studies and has been using Unna's wraps and also recommended a lymphedema pump. she has been very noncompliant with using these. 06/28/2016 -- the patient is still on antibiotics as prescribed by Dr. Ola Spurr and he is asked her to take it for 3 weeks. The patient also says she has a lot of redness and pain in the folds of her thigh and lower extremity and this is very painful. 07/26/2016 -- the patient is off antibiotics and has been getting dressing changes 3 times a week. 08/09/2016 -- is been on Cipro and is taking potassium supplements along with her Lasix and is going to be seeing Dr. Ola Spurr for a consult return visit only in November 2017. 08/23/2016 -- she was admitted to the hospital last week and I have reviewed these reports in detail. She was seen by Dr. Ola Spurr who noted a Pseudomonas infection which was resistant to ciprofloxacin and discharged her on Ceftazidime 1 g IV every 12 hourly anyone days. The antibiotic was to be stopped on September 11 and he would see her back in the clinic. 08/30/2016 -- had a communication from Dr. Ola Spurr that he would extend her antibiotics by a week if she SEVANNAH, MADIA. ([id number]) continued to look like cellulitis was persisting. 09/13/2016 -- the PICC line is out and antibiotics have stopped. Electronic Signature(s) Signed: 09/27/2016 9:39:01 AM By: Christin Fudge MD, FACS Entered By: Christin Fudge on 09/27/2016 09:39:00 [name] ([id number]) -------------------------------------------------------------------------------- Physical Exam Details Patient Name: VIDA, NICOL. Date of Service: 09/27/2016 8:45 AM Medical Record Number: [id number] Patient Account Number: [id number] Date of Birth/Sex: [birth year] (65 y.o. Female) Treating RN: Ahmed Prima Primary Care Physician: FITZGERALD, DAVID Other Clinician: Referring Physician: FITZGERALD, DAVID Treating Physician/Extender: Frann Rider in Treatment: 14 Constitutional . Pulse regular. Respirations normal and unlabored. Afebrile. . Eyes Nonicteric. Reactive to light. Ears, Nose, Mouth, and Throat Lips, teeth, and gums WNL.Marland Kitchen Moist mucosa without lesions. Neck supple and nontender. No palpable supraclavicular or cervical adenopathy. Normal sized without goiter. Respiratory WNL. No retractions.. Cardiovascular Pedal Pulses WNL. No clubbing, cyanosis or edema. Lymphatic No  adneopathy. No adenopathy. No adenopathy. Musculoskeletal Adexa without tenderness or enlargement.. Digits and nails w/o clubbing, cyanosis, infection, petechiae, ischemia, or inflammatory conditions.. Integumentary (Hair, Skin) No suspicious lesions. No crepitus or fluctuance. No peri-wound warmth or erythema. No masses.Marland Kitchen Psychiatric Judgement and insight Intact.. No evidence of depression, anxiety, or agitation.. Notes the wounds in the posterior part of her lower extremity continue to have significant amount of subcutaneous debris and these were sharply removed with a #3 curet and was not much bleeding. Left lower extremity edema has increased a bit this week Electronic Signature(s) Signed: 09/27/2016 9:39:50 AM By: Christin Fudge MD, FACS Entered By: Christin Fudge on 09/27/2016 09:39:49 [name] ([id number]) -------------------------------------------------------------------------------- Physician Orders  Details Patient Name: CHUNDRA, SAUERWEIN. Date of Service: 09/27/2016 8:45 AM Medical Record Number: [id number] Patient Account Number: [id number] Date of Birth/Sex: [birth year] (64 y.o. Female) Treating RN: Montey Hora Primary Care Physician: FITZGERALD, DAVID Other Clinician: Referring Physician: FITZGERALD, DAVID Treating Physician/Extender: Frann Rider in Treatment: 64 Verbal / Phone Orders: Yes Clinician: Montey Hora Read Back and Verified: Yes Diagnosis Coding Wound Cleansing Wound #1 Left,Circumferential Lower Leg o Cleanse wound with mild soap and water o May Shower, gently pat wound dry prior to applying new dressing. o May shower with protection. Wound #2 Right,Circumferential Lower Leg o Cleanse wound with mild soap and water o May Shower, gently pat wound dry prior to applying new dressing. o May shower with protection. Anesthetic Wound #1 Left,Circumferential Lower Leg o Topical Lidocaine 4% cream applied to wound bed prior to debridement Wound #2 Right,Circumferential Lower Leg o Topical Lidocaine 4% cream applied to wound bed prior to debridement Skin Barriers/Peri-Wound Care Wound #1 Left,Circumferential Lower Leg o Other: - Lotrisone cream in folds at knees and on reddened areas on legs (Nystatin used in clinic) Wound #2 Right,Circumferential Lower Leg o Other: - Lotrisone cream in folds at knees and on reddened areas on legs (Nystatin used in clinic) Primary Wound Dressing Wound #1 Left,Circumferential Lower Leg o Santyl Ointment - santyl on the posterior wounds only o Aquacel Ag - aquacel ag on all other open areas Wound #2 Right,Circumferential Lower Leg o Santyl Ointment - santyl on the posterior wounds only o Aquacel Ag - aquacel ag on all other open areas Secondary Dressing Wound #1 Left,Circumferential Lower Leg STEPHANYE, FINNICUM ([id number]) o ABD pad o Dry Gauze - over the santyl areas with the ABD  pad Wound #2 Right,Circumferential Lower Leg o ABD pad o Dry Gauze - over the santyl areas with the ABD pad Dressing Change Frequency Wound #1 Left,Circumferential Lower Leg o Other: - Tuesday and Friday - twice weekly - Fridays at [place] Wound #2 Right,Circumferential Lower Leg o Other: - Tuesday and Friday - twice weekly - Fridays at [place] Follow-up Appointments Wound #1 Left,Circumferential Lower Leg o Return Appointment in 1 week. Wound #2 Right,Circumferential Lower Leg o Return Appointment in 1 week. Edema Control Wound #1 Left,Circumferential Lower Leg o 3 Layer Compression System - Bilateral - May anchor top of wrap with dome paste, but only wrap around leg one time. o Elevate legs to the level of the heart and pump ankles as often as possible Wound #2 Right,Circumferential Lower Leg o 3 Layer Compression System - Bilateral - May anchor top of wrap with dome paste, but only wrap around leg one time. o Elevate legs to the level of the heart and pump ankles as often as possible Home Health Wound #1 Left,Circumferential  Lower Leg o [place] Visits - [place] Nurse may visit PRN to address patientos wound care needs. o FACE TO FACE ENCOUNTER: MEDICARE and MEDICAID PATIENTS: I certify that this patient is under my care and that I had a face-to-face encounter that meets the physician face-to-face encounter requirements with this patient on this date. The encounter with the patient was in whole or in part for the following MEDICAL CONDITION: (primary reason for [place]) MEDICAL NECESSITY: I certify, that based on my findings, NURSING services are a medically necessary home health service. HOME BOUND STATUS: I certify that my clinical findings support that this patient is homebound (i.e., Due to illness or injury, pt requires aid of supportive devices such as crutches, cane, wheelchairs,  walkers, the use of special transportation or the assistance of another person to leave their place of residence. There is a normal inability to leave the home and doing so requires considerable and taxing effort. Other absences are for medical reasons / religious services and are infrequent or of short duration when for other reasons). BRITHANY, WHITWORTH ([id number]) o Please direct any NON-WOUND related issues/requests for orders to patient's Primary Care Physician Wound #2 Right,Circumferential Lower Leg o [place] Visits - [place] Nurse may visit PRN to address patientos wound care needs. o FACE TO FACE ENCOUNTER: MEDICARE and MEDICAID PATIENTS: I certify that this patient is under my care and that I had a face-to-face encounter that meets the physician face-to-face encounter requirements with this patient on this date. The encounter with the patient was in whole or in part for the following MEDICAL CONDITION: (primary reason for [place]) MEDICAL NECESSITY: I certify, that based on my findings, NURSING services are a medically necessary home health service. HOME BOUND STATUS: I certify that my clinical findings support that this patient is homebound (i.e., Due to illness or injury, pt requires aid of supportive devices such as crutches, cane, wheelchairs, walkers, the use of special transportation or the assistance of another person to leave their place of residence. There is a normal inability to leave the home and doing so requires considerable and taxing effort. Other absences are for medical reasons / religious services and are infrequent or of short duration when for other reasons). o Please direct any NON-WOUND related issues/requests for orders to patient's Primary Care Physician Electronic Signature(s) Signed: 09/27/2016 3:47:29 PM By: Christin Fudge MD, FACS Signed: 09/27/2016 4:28:24 PM By: Montey Hora Entered By: Montey Hora  on 09/27/2016 09:35:39 [name] ([id number]) -------------------------------------------------------------------------------- Problem List Details Patient Name: AMEERA, TIGUE. Date of Service: 09/27/2016 8:45 AM Medical Record Number: [id number] Patient Account Number: [id number] Date of Birth/Sex: [birth year] (65 y.o. 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 Date of Service:  09/27/2016 8:45 AM Medical Record Number: [id number] Patient Account Number: [id number] Date of Birth/Sex: 03/20/1951 (65 y.o. Female) Treating RN: Ahmed Prima Primary Care Physician: FITZGERALD, DAVID Other Clinician: Referring Physician: FITZGERALD, DAVID Treating Physician/Extender: Frann Rider in Treatment: 14 Subjective Chief Complaint Information obtained from Patient Patients presents for treatment of an open diabetic ulcer and significant lymphedema which she's had for about two years History of Present  Illness (HPI) The following HPI elements were documented for the patient's wound: Location: massive swelling of both lower extremities and ulceration both lower extremities Quality: Patient reports experiencing a dull pain to affected area(s). Severity: Patient states wound are getting worse. Duration: Patient has had the wound for >2 years prior to seeking treatment at the wound center Timing: Pain in wound is constant (hurts all the time) Context: The wound appeared gradually over time Modifying Factors: Other treatment(s) tried include:she has a lymphedema pump but uses it seldom and she's had several course of antibiotics Associated Signs and Symptoms: Patient reports having difficulty standing for long periods. 65 year old patient seen by Dr. Ola Spurr of infectious disease who has been following up for left lower extremity cellulitis and ulcer with recurrent bilateral lower extremity problems for several months. Recently she had a large right lower extremity bullae which opened out and has been ulcerated. She has seen the vascular group and has been getting Unna's wraps and has a lymphedema pump used in the past. Her prior cultures were positive for Pseudomonas, Proteus and was treated with amoxicillin. He has also been treated with 2 weeks course of ciprofloxacin and amoxicillin.. Increase of Lasix dose helped with the edema and echo showed no systolic CHF but may be diastolic problems. Past medical history significant for diabetes mellitus type 2, venous stasis ulcer, obesity, diabetic peripheral neuropathy, status post back surgery, cholecystectomy, hysterectomy, arthroscopy of the knee. He is a former smoker and quit smoking in 1984. The patient has seen Dr. Delana Meyer who did not recommend any arterial or venous duplex studies and has been using Unna's wraps and also recommended a lymphedema pump. she has been very noncompliant with using these. 06/28/2016 -- the patient is still on  antibiotics as prescribed by Dr. Ola Spurr and he is asked her to take it for 3 weeks. The patient also says she has a lot of redness and pain in the folds of her thigh and lower extremity and this is very painful. 07/26/2016 -- the patient is off antibiotics and has been getting dressing changes 3 times a week. 08/09/2016 -- is been on Cipro and is taking potassium supplements along with her Lasix and is going to be ANALIYA, PORCO. ([id number]) seeing Dr. Ola Spurr for a consult return visit only in November 2017. 08/23/2016 -- she was admitted to the hospital last week and I have reviewed these reports in detail. She was seen by Dr. Ola Spurr who noted a Pseudomonas infection which was resistant to ciprofloxacin and discharged her on Ceftazidime 1 g IV every 12 hourly anyone days. The antibiotic was to be stopped on September 11 and he would see her back in the clinic. 08/30/2016 -- had a communication from Dr. Ola Spurr that he would extend her antibiotics by a week if she continued to look like cellulitis was persisting. 09/13/2016 -- the PICC line is out and antibiotics have stopped. Objective Constitutional Pulse regular. Respirations normal and unlabored. Afebrile. Vitals Time Taken: 8:55 AM, Height: 67 in, Weight: 300 lbs, BMI: 47, Temperature: 98.2 F, Pulse: 68 bpm, Respiratory Rate: 20  breaths/min, Blood Pressure: 157/64 mmHg. Eyes Nonicteric. Reactive to light. Ears, Nose, Mouth, and Throat Lips, teeth, and gums WNL.Marland Kitchen Moist mucosa without lesions. Neck supple and nontender. No palpable supraclavicular or cervical adenopathy. Normal sized without goiter. Respiratory WNL. No retractions.. Cardiovascular Pedal Pulses WNL. No clubbing, cyanosis or edema. Lymphatic No adneopathy. No adenopathy. No adenopathy. Musculoskeletal Adexa without tenderness or enlargement.. Digits and nails w/o clubbing, cyanosis, infection, petechiae, ischemia, or inflammatory  conditions.Marland Kitchen Psychiatric Judgement and insight Intact.. No evidence of depression, anxiety, or agitation.Marland Kitchen CIENA, SAMPLEY. ([id number]) General Notes: the wounds in the posterior part of her lower extremity continue to have significant amount of subcutaneous debris and these were sharply removed with a #3 curet and was not much bleeding. Left lower extremity edema has increased a bit this week Integumentary (Hair, Skin) No suspicious lesions. No crepitus or fluctuance. No peri-wound warmth or erythema. No masses.. Wound #1 status is Open. Original cause of wound was Gradually Appeared. The wound is located on the Left,Circumferential Lower Leg. The wound measures 6cm length x 12cm width x 0.2cm depth; 56.549cm^2 area and 11.31cm^3 volume. The wound is limited to skin breakdown. There is no tunneling or undermining noted. There is a large amount of serosanguineous drainage noted. The wound margin is flat and intact. There is no granulation within the wound bed. There is a large (67-100%) amount of necrotic tissue within the wound bed including Adherent Slough. The periwound skin appearance exhibited: Localized Edema, Scarring, Maceration, Moist, Hemosiderin Staining. The periwound skin appearance did not exhibit: Callus, Crepitus, Excoriation, Fluctuance, Friable, Induration, Rash, Dry/Scaly, Atrophie Blanche, Cyanosis, Ecchymosis, Mottled, Pallor, Rubor, Erythema. Periwound temperature was noted as No Abnormality. The periwound has tenderness on palpation. Wound #2 status is Open. Original cause of wound was Gradually Appeared. The wound is located on the Right,Circumferential Lower Leg. The wound measures 5cm length x 9.5cm width x 0.3cm depth; 37.306cm^2 area and 11.192cm^3 volume. The wound is limited to skin breakdown. There is no tunneling or undermining noted. There is a large amount of serosanguineous drainage noted. The wound margin is indistinct and nonvisible. There is no  granulation within the wound bed. There is a large (67-100%) amount of necrotic tissue within the wound bed including Adherent Slough. The periwound skin appearance exhibited: Localized Edema, Scarring, Maceration, Moist, Hemosiderin Staining. The periwound skin appearance did not exhibit: Callus, Crepitus, Excoriation, Fluctuance, Friable, Induration, Rash, Dry/Scaly, Atrophie Blanche, Cyanosis, Ecchymosis, Mottled, Pallor, Rubor, Erythema. Periwound temperature was noted as No Abnormality. The periwound has tenderness on palpation. 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 ([id number]) Procedures Wound #1 Wound #1 is a Lymphedema located on the Left,Circumferential Lower Leg . There was a Skin/Subcutaneous Tissue Debridement ([id number]) debridement with total area of 12 sq cm performed by Christin Fudge, MD. with the following instrument(s): Curette to remove Viable and Non-Viable tissue/material including Fibrin/Slough and Subcutaneous after achieving pain control using Lidocaine 4% Topical Solution. A time out was conducted at 09:28, prior to the start of the procedure. A Minimum amount of bleeding was controlled with Pressure. The procedure was tolerated well with a pain level of 0 throughout and a pain level of 0 following the procedure.  Post Debridement Measurements: 6cm length x 12cm width x 0.2cm depth; 11.31cm^3 volume. Character of Wound/Ulcer Post Debridement is improved. Severity of Tissue Post Debridement is: Fat layer exposed. Post procedure Diagnosis Wound #1: Same as Pre-Procedure Wound #2 Wound #2 is a Lymphedema located  on the Right,Circumferential Lower Leg . There was a Skin/Subcutaneous Tissue Debridement ([id number]) debridement with total area of 2.25 sq cm performed by Christin Fudge, MD. with the following instrument(s): Curette to remove Viable and Non-Viable tissue/material including Fibrin/Slough and Subcutaneous after achieving pain control using Lidocaine 4% Topical Solution. A time out was conducted at 09:30, prior to the start of the procedure. A Minimum amount of bleeding was controlled with Pressure. The procedure was tolerated well with a pain level of 0 throughout and a pain level of 0 following the procedure. Post Debridement Measurements: 5cm length x 9.5cm width x 0.3cm depth; 11.192cm^3 volume. Character of Wound/Ulcer Post Debridement is improved. Severity of Tissue Post Debridement is: Fat layer exposed. Post procedure Diagnosis Wound #2: Same as Pre-Procedure Plan Wound Cleansing: Wound #1 Left,Circumferential Lower Leg: Cleanse wound with mild soap and water May Shower, gently pat wound dry prior to applying new dressing. May shower with protection. Wound #2 Right,Circumferential Lower Leg: Cleanse wound with mild soap and water May Shower, gently pat wound dry prior to applying new dressing. May shower with protection. Anesthetic: Wound #1 Left,Circumferential Lower Leg: Topical Lidocaine 4% cream applied to wound bed prior to debridement Wound #2 Right,Circumferential Lower Leg: SEREENA, MARANDO ([id number]) Topical Lidocaine 4% cream applied to wound bed prior to debridement Skin Barriers/Peri-Wound Care: Wound #1 Left,Circumferential Lower Leg: Other: - Lotrisone cream in folds at knees and on reddened areas on legs (Nystatin used in clinic) Wound #2 Right,Circumferential Lower Leg: Other: - Lotrisone cream in folds at knees and on reddened areas on legs (Nystatin used in clinic) Primary Wound Dressing: Wound #1 Left,Circumferential Lower Leg: Santyl Ointment -  santyl on the posterior wounds only Aquacel Ag - aquacel ag on all other open areas Wound #2 Right,Circumferential Lower Leg: Santyl Ointment - santyl on the posterior wounds only Aquacel Ag - aquacel ag on all other open areas Secondary Dressing: Wound #1 Left,Circumferential Lower Leg: ABD pad Dry Gauze - over the santyl areas with the ABD pad Wound #2 Right,Circumferential Lower Leg: ABD pad Dry Gauze - over the santyl areas with the ABD pad Dressing Change Frequency: Wound #1 Left,Circumferential Lower Leg: Other: - Tuesday and Friday - twice weekly - Fridays at [place] Wound #2 Right,Circumferential Lower Leg: Other: - Tuesday and Friday - twice weekly - Fridays at [place] Follow-up Appointments: Wound #1 Left,Circumferential Lower Leg: Return Appointment in 1 week. Wound #2 Right,Circumferential Lower Leg: Return Appointment in 1 week. Edema Control: Wound #1 Left,Circumferential Lower Leg: 3 Layer Compression System - Bilateral - May anchor top of wrap with dome paste, but only wrap around leg one time. Elevate legs to the level of the heart and pump ankles as often as possible Wound #2 Right,Circumferential Lower Leg: 3 Layer Compression System - Bilateral - May anchor top of wrap with dome paste, but only wrap around leg one time. Elevate legs to the level of the heart and pump ankles as often as possible Home Health: Wound #1 Left,Circumferential Lower Leg: Continue Home Health Visits - [place] Nurse may visit PRN to address patient s wound care needs. FACE TO FACE ENCOUNTER: MEDICARE and MEDICAID PATIENTS: I certify that this patient  is under my care and that I had a face-to-face encounter that meets the physician face-to-face encounter requirements with this patient on this date. The encounter with the patient was in whole or in part for the following MEDICAL CONDITION: (primary reason for [place]) MEDICAL NECESSITY:  I certify, that based on my findings, NURSING services are a medically necessary home health service. HOME BOUND STATUS: I certify that my clinical findings support that this patient is homebound (i.e., Due to illness or injury, pt requires aid of supportive devices such as crutches, cane, wheelchairs, walkers, the use of special transportation or the assistance of another person to leave their place of residence. There is a MARKELLE, ASARO. ([id number]) normal inability to leave the home and doing so requires considerable and taxing effort. Other absences are for medical reasons / religious services and are infrequent or of short duration when for other reasons). Please direct any NON-WOUND related issues/requests for orders to patient's Primary Care Physician Wound #2 Right,Circumferential Lower Leg: [place] Visits - [place] Nurse may visit PRN to address patient s wound care needs. FACE TO FACE ENCOUNTER: MEDICARE and MEDICAID PATIENTS: I certify that this patient is under my care and that I had a face-to-face encounter that meets the physician face-to-face encounter requirements with this patient on this date. The encounter with the patient was in whole or in part for the following MEDICAL CONDITION: (primary reason for [place]) MEDICAL NECESSITY: I certify, that based on my findings, NURSING services are a medically necessary home health service. HOME BOUND STATUS: I certify that my clinical findings support that this patient is homebound (i.e., Due to illness or injury, pt requires aid of supportive devices such as crutches, cane, wheelchairs, walkers, the use of special transportation or the assistance of another person to leave their place of residence. There is a normal inability to leave the home and doing so requires considerable and taxing effort. Other absences are for medical reasons / religious services and are infrequent or of short duration  when for other reasons). Please direct any NON-WOUND related issues/requests for orders to patient's Primary Care Physician We will continue to use silver alginate on most of the wounds. The wounds on the posterior aspect of both legs have significant amount of subcutaneous debris and we will use Santyl ointment for these. Rest of the wounds washed out well with moist saline gauze and we can use silver alginate on them. She will have a Profore lite applied bilaterally. We will see her back next week. Electronic Signature(s) Signed: 09/27/2016 9:41:10 AM By: Christin Fudge MD, FACS Entered By: Christin Fudge on 09/27/2016 09:41:10 [name] ([id number]) -------------------------------------------------------------------------------- SuperBill Details Patient Name: NAYRA, COURY. Date of Service: 09/27/2016 Medical Record Number: [id number] Patient Account Number: [id number] Date of Birth/Sex: February 01, 1951 (65 y.o. 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 E66.01 Morbid (severe) obesity due to excess calories B37.2 Candidiasis of skin and nail Facility Procedures CPT4 Code: [id number] Description: 02409 - DEB SUBQ TISSUE 20 SQ CM/< ICD-10 Description Diagnosis E11.622 Type 2 diabetes mellitus with other skin ulcer I89.0 Lymphedema, not elsewhere classified L97.222 Non-pressure chronic  ulcer of left calf with fat l 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

## 2016-10-04 ENCOUNTER — Encounter: Payer: 59 | Attending: Surgery | Admitting: Nurse Practitioner

## 2016-10-04 DIAGNOSIS — L97222 Non-pressure chronic ulcer of left calf with fat layer exposed: Secondary | ICD-10-CM | POA: Insufficient documentation

## 2016-10-04 DIAGNOSIS — L97212 Non-pressure chronic ulcer of right calf with fat layer exposed: Secondary | ICD-10-CM | POA: Diagnosis not present

## 2016-10-04 DIAGNOSIS — E11622 Type 2 diabetes mellitus with other skin ulcer: Secondary | ICD-10-CM | POA: Diagnosis not present

## 2016-10-04 DIAGNOSIS — B372 Candidiasis of skin and nail: Secondary | ICD-10-CM | POA: Diagnosis not present

## 2016-10-04 DIAGNOSIS — Z87891 Personal history of nicotine dependence: Secondary | ICD-10-CM | POA: Diagnosis not present

## 2016-10-04 DIAGNOSIS — I890 Lymphedema, not elsewhere classified: Secondary | ICD-10-CM | POA: Diagnosis not present

## 2016-10-04 DIAGNOSIS — E1140 Type 2 diabetes mellitus with diabetic neuropathy, unspecified: Secondary | ICD-10-CM | POA: Diagnosis not present

## 2016-10-04 DIAGNOSIS — Z6841 Body Mass Index (BMI) 40.0 and over, adult: Secondary | ICD-10-CM | POA: Insufficient documentation

## 2016-10-05 NOTE — Progress Notes (Addendum)
Brooke Hopkins ([id number]) Visit Report for 10/04/2016 Chief Complaint Document Details Patient Name: Brooke Hopkins, Brooke Hopkins. Date of Service: 10/04/2016 8:45 AM Medical Record Patient Account Number: [id number] Number: Afful, RN, BSN, Treating RN: 10/24/1951 [phone] y.o. Velva Harman Date of Birth/Sex: Female) Other Clinician: Primary Care Physician: FITZGERALD, DAVID Treating Londell Moh Referring Physician: FITZGERALD, DAVID Physician/Extender: Suella Grove in Treatment: 15 Information Obtained from: Patient Chief Complaint Patients presents for treatment of an open diabetic ulcer and significant lymphedema which she's had for about two years Electronic Signature(s) Signed: 10/04/2016 4:21:25 PM By: Londell Moh FNP Entered By: Londell Moh on 10/04/2016 09:20:55 [name] ([id number]) -------------------------------------------------------------------------------- HPI Details Patient Name: Brooke Hopkins. Date of Service: 10/04/2016 8:45 AM Medical Record Patient Account Number: [id number] Number: Afful, RN, BSN, Treating RN: 1951-09-09 [phone] y.o. Velva Harman Date of Birth/Sex: Female) Other Clinician: Primary Care Physician: FITZGERALD, DAVID Treating Londell Moh Referring Physician: FITZGERALD, DAVID Physician/Extender: Weeks in Treatment: 15 History of Present Illness Location: massive swelling of both lower extremities and ulceration both lower extremities Quality: Patient reports experiencing a dull pain to affected area(s). Severity: Patient states wound are getting worse. Duration: Patient has had the wound for >2 years prior to seeking treatment at the wound center Timing: Pain in wound is constant (hurts all the time) Context: The wound appeared gradually over time Modifying Factors: Other treatment(s) tried include:she has a lymphedema pump but uses it seldom and she's had several course of antibiotics Associated Signs and Symptoms:  Patient reports having difficulty standing for long periods. HPI Description: 65 year old patient seen by Dr. Ola Spurr of infectious disease who has been following up for left lower extremity cellulitis and ulcer with recurrent bilateral lower extremity problems for several months. Recently she had a large right lower extremity bullae which opened out and has been ulcerated. She has seen the vascular group and has been getting Unna's wraps and has a lymphedema pump used in the past. Her prior cultures were positive for Pseudomonas, Proteus and was treated with amoxicillin. He has also been treated with 2 weeks course of ciprofloxacin and amoxicillin.. Increase of Lasix dose helped with the edema and echo showed no systolic CHF but may be diastolic problems. Past medical history significant for diabetes mellitus type 2, venous stasis ulcer, obesity, diabetic peripheral neuropathy, status post back surgery, cholecystectomy, hysterectomy, arthroscopy of the knee. He is a former smoker and quit smoking in 1984. The patient has seen Dr. Delana Meyer who did not recommend any arterial or venous duplex studies and has been using Unna's wraps and also recommended a lymphedema pump. she has been very noncompliant with using these. 06/28/2016 -- the patient is still on antibiotics as prescribed by Dr. Ola Spurr and he is asked her to take it for 3 weeks. The patient also says she has a lot of redness and pain in the folds of her thigh and lower extremity and this is very painful. 07/26/2016 -- the patient is off antibiotics and has been getting dressing changes 3 times a week. 08/09/2016 -- is been on Cipro and is taking potassium supplements along with her Lasix and is going to be seeing Dr. Ola Spurr for a consult return visit only in November 2017. 08/23/2016 -- she was admitted to the hospital last week and I have reviewed these reports in detail. She was seen by Dr. Ola Spurr who noted a Pseudomonas  infection which was resistant to ciprofloxacin and discharged her on Ceftazidime 1 g IV every 12 hourly anyone days. The antibiotic was  to be stopped on September 11 and he would see her back in the clinic. CANDELARIA, PIES ([id number]) 08/30/2016 -- had a communication from Dr. Ola Spurr that he would extend her antibiotics by a week if she continued to look like cellulitis was persisting. 09/13/2016 -- the PICC line is out and antibiotics have stopped. 106/17: returns today for f/u. reports utilizes compression pump twice daily. she is compliant with her compression therapy. she reports a new blister on the right proximal posterior calf. no systemic s/s of infection. Electronic Signature(s) Signed: 10/04/2016 4:21:25 PM By: Londell Moh FNP Entered By: Londell Moh on 10/04/2016 09:22:18 [name] ([id number]) -------------------------------------------------------------------------------- Physical Exam Details Patient Name: Brooke Hopkins. Date of Service: 10/04/2016 8:45 AM Medical Record Patient Account Number: [id number] Number: Afful, RN, BSN, Treating RN: [birth year] [phone] y.o. Velva Harman Date of Birth/Sex: Female) Other Clinician: Primary Care Physician: FITZGERALD, DAVID Treating Londell Moh Referring Physician: FITZGERALD, DAVID Physician/Extender: Weeks in Treatment: 15 Constitutional morbidly obese. NAD.. Ears, Nose, Mouth, and Throat Patient can hear normal speaking tones without difficulty.. Cardiovascular 3 + non pitting Edema present in both extremities. there is tissue fibrosis. assessment findings c/w Grade 3 lymphedema.Marland Kitchen Psychiatric Judgement and insight intact.. Alert and oriented times 3.. No evidence of depression, anxiety, or agitation. Calm, cooperative, and communicative. Appropriate interactions and affect.. Electronic Signature(s) Signed: 10/04/2016 4:21:25 PM By: Londell Moh FNP Entered By: Londell Moh on  10/04/2016 09:23:33 [name] ([id number]) -------------------------------------------------------------------------------- Physician Orders Details Patient Name: Brooke Hopkins. Date of Service: 10/04/2016 8:45 AM Medical Record Patient Account Number: [id number] Number: Afful, RN, BSN, Treating RN: 1951-12-30 [phone] y.o. Velva Harman Date of Birth/Sex: Female) Other Clinician: Primary Care Physician: FITZGERALD, DAVID Treating Londell Moh Referring Physician: FITZGERALD, DAVID Physician/Extender: Suella Grove in Treatment: 15 Verbal / Phone Orders: Yes Clinician: Afful, RN, BSN, Rita Read Back and Verified: Yes Diagnosis Coding Wound Cleansing Wound #1 Left,Circumferential Lower Leg o Cleanse wound with mild soap and water o May Shower, gently pat wound dry prior to applying new dressing. o May shower with protection. Wound #2 Right,Circumferential Lower Leg o Cleanse wound with mild soap and water o May Shower, gently pat wound dry prior to applying new dressing. o May shower with protection. Anesthetic Wound #1 Left,Circumferential Lower Leg o Topical Lidocaine 4% cream applied to wound bed prior to debridement Wound #2 Right,Circumferential Lower Leg o Topical Lidocaine 4% cream applied to wound bed prior to debridement Skin Barriers/Peri-Wound Care Wound #1 Left,Circumferential Lower Leg o Other: - Lotrisone cream in folds at knees and on reddened areas on legs (Nystatin used in clinic) Wound #2 Right,Circumferential Lower Leg o Other: - Lotrisone cream in folds at knees and on reddened areas on legs (Nystatin used in clinic) Primary Wound Dressing Wound #1 Left,Circumferential Lower Leg o Santyl Ointment - santyl on the posterior wounds only o Aquacel Ag - aquacel ag on all other open areas Wound #2 Right,Circumferential Lower Leg o Santyl Ointment - santyl on the posterior wounds only o Aquacel Ag - aquacel ag on all other open  areas HILDRETH, ORSAK ([id number]) Secondary Dressing Wound #1 Left,Circumferential Lower Leg o ABD pad o Dry Gauze - over the santyl areas with the ABD pad Wound #2 Right,Circumferential Lower Leg o ABD pad o Dry Gauze - over the santyl areas with the ABD pad Dressing Change Frequency Wound #1 Left,Circumferential Lower Leg o Other: - Tuesday and Friday - twice weekly - Fridays at [place] #2 Right,Circumferential Lower  Leg o Other: - Tuesday and Friday - twice weekly - Fridays at [place] Follow-up Appointments Wound #1 Left,Circumferential Lower Leg o Return Appointment in 1 week. Wound #2 Right,Circumferential Lower Leg o Return Appointment in 1 week. Edema Control Wound #1 Left,Circumferential Lower Leg o 3 Layer Compression System - Bilateral - May anchor top of wrap with dome paste, but only wrap around leg one time. o Elevate legs to the level of the heart and pump ankles as often as possible Wound #2 Right,Circumferential Lower Leg o 3 Layer Compression System - Bilateral - May anchor top of wrap with dome paste, but only wrap around leg one time. o Elevate legs to the level of the heart and pump ankles as often as possible Home Health Wound #1 Left,Circumferential Lower Leg o [place] Visits - [place] Nurse may visit PRN to address patientos wound care needs. o FACE TO FACE ENCOUNTER: MEDICARE and MEDICAID PATIENTS: I certify that this patient is under my care and that I had a face-to-face encounter that meets the physician face-to-face encounter requirements with this patient on this date. The encounter with the patient was in whole or in part for the following MEDICAL CONDITION: (primary reason for [place]) MEDICAL NECESSITY: I certify, that based on my findings, NURSING services are a medically necessary home health service. HOME BOUND STATUS: I certify that my  clinical findings support that this patient is homebound (i.e., Due to illness or injury, pt requires aid of supportive devices such as crutches, cane, wheelchairs, walkers, the use of special transportation or the assistance of another person to leave their place of residence. There is a normal inability to leave the home and doing so requires considerable and taxing effort. KENADY, DOXTATER ([id number]) absences are for medical reasons / religious services and are infrequent or of short duration when for other reasons). o Please direct any NON-WOUND related issues/requests for orders to patient's Primary Care Physician Wound #2 Right,Circumferential Lower Leg o [place] Visits - [place] Nurse may visit PRN to address patientos wound care needs. o FACE TO FACE ENCOUNTER: MEDICARE and MEDICAID PATIENTS: I certify that this patient is under my care and that I had a face-to-face encounter that meets the physician face-to-face encounter requirements with this patient on this date. The encounter with the patient was in whole or in part for the following MEDICAL CONDITION: (primary reason for [place]) MEDICAL NECESSITY: I certify, that based on my findings, NURSING services are a medically necessary home health service. HOME BOUND STATUS: I certify that my clinical findings support that this patient is homebound (i.e., Due to illness or injury, pt requires aid of supportive devices such as crutches, cane, wheelchairs, walkers, the use of special transportation or the assistance of another person to leave their place of residence. There is a normal inability to leave the home and doing so requires considerable and taxing effort. Other absences are for medical reasons / religious services and are infrequent or of short duration when for other reasons). o Please direct any NON-WOUND related issues/requests for orders to patient's Primary  Care Physician Notes Apply betadine to the blister Electronic Signature(s) Signed: 10/04/2016 3:48:46 PM By: Regan Lemming BSN, RN Signed: 10/04/2016 4:21:25 PM By: Londell Moh FNP Entered By: Regan Lemming on 10/04/2016 09:14:47 [name] ([id number]) -------------------------------------------------------------------------------- Problem List Details Patient Name: KYARAH, ENAMORADO. Date of Service: 10/04/2016 8:45 AM Medical Record Patient Account  Number: [id number] Number: Afful, RN, BSN, Treating RN: 13-Jun-[phone] y.o. 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 Date of Service: 10/04/2016 8:45 AM Medical Record Patient Account Number: [id number] Number: Afful, RN, BSN, Treating RN: 12-Dec-1951 [phone] y.o. Velva Harman Date of Birth/Sex: Female) Other Clinician: Primary Care Physician: FITZGERALD, DAVID Treating Londell Moh Referring Physician: FITZGERALD,  DAVID Physician/Extender: Weeks in Treatment: 15 Subjective Chief Complaint Information obtained from Patient Patients presents for treatment of an open diabetic ulcer and significant lymphedema which she's had for about two years History of Present Illness (HPI) The following HPI elements were documented for the patient's wound: Location: massive swelling of both lower extremities and ulceration both lower extremities Quality: Patient reports experiencing a dull pain to affected area(s). Severity: Patient states wound are getting worse. Duration: Patient has had the wound for >2 years prior to seeking treatment at the wound center Timing: Pain in wound is constant (hurts all the time) Context: The wound appeared gradually over time Modifying Factors: Other treatment(s) tried include:she has a lymphedema pump but uses it seldom and she's had several course of antibiotics Associated Signs and Symptoms: Patient reports having difficulty standing for long periods. 65 year old patient seen by Dr. Ola Spurr of infectious disease who has been following up for left lower extremity cellulitis and ulcer with recurrent bilateral lower extremity problems for several months. Recently she had a large right lower extremity bullae which opened out and has been ulcerated. She has seen the vascular group and has been getting Unna's wraps and has a lymphedema pump used in the past. Her prior cultures were positive for Pseudomonas, Proteus and was treated with amoxicillin. He has also been treated with 2 weeks course of ciprofloxacin and amoxicillin.. Increase of Lasix dose helped with the edema and echo showed no systolic CHF but may be diastolic problems. Past medical history significant for diabetes mellitus type 2, venous stasis ulcer, obesity, diabetic peripheral neuropathy, status post back surgery, cholecystectomy, hysterectomy, arthroscopy of the knee. He is a former smoker and quit smoking in  1984. The patient has seen Dr. Delana Meyer who did not recommend any arterial or venous duplex studies and has been using Unna's wraps and also recommended a lymphedema pump. she has been very noncompliant with using these. 06/28/2016 -- the patient is still on antibiotics as prescribed by Dr. Ola Spurr and he is asked her to take it for 3 weeks. The patient also says she has a lot of redness and pain in the folds of her thigh and lower extremity and this is very painful. PRAIRIE, STENBERG ([id number]) 07/26/2016 -- the patient is off antibiotics and has been getting dressing changes 3 times a week. 08/09/2016 -- is been on Cipro and is taking potassium supplements along with her Lasix and is going to  be seeing Dr. Ola Spurr for a consult return visit only in November 2017. 08/23/2016 -- she was admitted to the hospital last week and I have reviewed these reports in detail. She was seen by Dr. Ola Spurr who noted a Pseudomonas infection which was resistant to ciprofloxacin and discharged her on Ceftazidime 1 g IV every 12 hourly anyone days. The antibiotic was to be stopped on September 11 and he would see her back in the clinic. 08/30/2016 -- had a communication from Dr. Ola Spurr that he would extend her antibiotics by a week if she continued to look like cellulitis was persisting. 09/13/2016 -- the PICC line is out and antibiotics have stopped. 106/17: returns today for f/u. reports utilizes compression pump twice daily. she is compliant with her compression therapy. she reports a new blister on the right proximal posterior calf. no systemic s/s of infection. Objective Constitutional morbidly obese. NAD.Marland Kitchen Vitals Time Taken: 8:45 AM, Height: 67 in, Weight: 300 lbs, BMI: 47, Temperature: 98.1 F, Pulse: 66 bpm, Respiratory Rate: 19 breaths/min, Blood Pressure: 181/67 mmHg. Ears, Nose, Mouth, and Throat Patient can hear normal speaking tones without difficulty.. Cardiovascular 3 + non  pitting Edema present in both extremities. there is tissue fibrosis. assessment findings c/w Grade 3 lymphedema.Marland Kitchen Psychiatric Judgement and insight intact.. Alert and oriented times 3.. No evidence of depression, anxiety, or agitation. Calm, cooperative, and communicative. Appropriate interactions and affect.. Integumentary (Hair, Skin) Wound #1 status is Open. Original cause of wound was Gradually Appeared. The wound is located on the Left,Circumferential Lower Leg. The wound measures 6cm length x 11cm width x 0.2cm depth; 51.836cm^2 area and 10.367cm^3 volume. The wound is limited to skin breakdown. There is no tunneling or undermining noted. There is a large amount of serosanguineous drainage noted. The wound margin is flat and intact. There is no granulation within the wound bed. There is a large (67-100%) amount of necrotic tissue within the wound bed including Adherent Slough. The periwound skin appearance exhibited: KENLY, HENCKEL. ([id number]) Localized Edema, Scarring, Maceration, Moist, Hemosiderin Staining. The periwound skin appearance did not exhibit: Callus, Crepitus, Excoriation, Fluctuance, Friable, Induration, Rash, Dry/Scaly, Atrophie Blanche, Cyanosis, Ecchymosis, Mottled, Pallor, Rubor, Erythema. Periwound temperature was noted as No Abnormality. The periwound has tenderness on palpation. Wound #2 status is Open. Original cause of wound was Gradually Appeared. The wound is located on the Right,Circumferential Lower Leg. The wound measures 5cm length x 9.5cm width x 0.3cm depth; 37.306cm^2 area and 11.192cm^3 volume. The wound is limited to skin breakdown. There is no tunneling or undermining noted. There is a large amount of serosanguineous drainage noted. The wound margin is indistinct and nonvisible. There is no granulation within the wound bed. There is a large (67-100%) amount of necrotic tissue within the wound bed including Adherent Slough. The periwound skin  appearance exhibited: Localized Edema, Scarring, Maceration, Moist, Hemosiderin Staining. The periwound skin appearance did not exhibit: Callus, Crepitus, Excoriation, Fluctuance, Friable, Induration, Rash, Dry/Scaly, Atrophie Blanche, Cyanosis, Ecchymosis, Mottled, Pallor, Rubor, Erythema. Periwound temperature was noted as No Abnormality. The periwound has tenderness on palpation. 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.212 - Non-pressure chronic ulcer of right calf with fat layer exposed E66.01 - Morbid (severe) obesity due to excess calories B37.2 - Candidiasis of skin and nail Diagnoses ICD-10 E11.622: Type 2 diabetes mellitus with other skin ulcer I89.0: Lymphedema, not  elsewhere classified L97.222: Non-pressure chronic ulcer of left calf with fat layer exposed L97.212: Non-pressure chronic ulcer of right calf with fat layer exposed E66.01: Morbid (severe) obesity due to excess calories B37.2: Candidiasis of skin and nail Plan Wound Cleansing: Wound #1 Left,Circumferential Lower Leg: Cleanse wound with mild soap and water TRAVIS, PURK ([id number]) May Shower, gently pat wound dry prior to applying new dressing. May shower with protection. Wound #2 Right,Circumferential Lower Leg: Cleanse wound with mild soap and water May Shower, gently pat wound dry prior to applying new dressing. May shower with protection. Anesthetic: Wound #1 Left,Circumferential Lower Leg: Topical Lidocaine 4% cream applied to wound bed prior to debridement Wound #2 Right,Circumferential Lower Leg: Topical Lidocaine 4% cream applied to wound bed prior to debridement Skin Barriers/Peri-Wound Care: Wound #1 Left,Circumferential Lower Leg: Other: - Lotrisone cream in folds at knees and on reddened areas on legs (Nystatin used  in clinic) Wound #2 Right,Circumferential Lower Leg: Other: - Lotrisone cream in folds at knees and on reddened areas on legs (Nystatin used in clinic) Primary Wound Dressing: Wound #1 Left,Circumferential Lower Leg: Santyl Ointment - santyl on the posterior wounds only Aquacel Ag - aquacel ag on all other open areas Wound #2 Right,Circumferential Lower Leg: Santyl Ointment - santyl on the posterior wounds only Aquacel Ag - aquacel ag on all other open areas Secondary Dressing: Wound #1 Left,Circumferential Lower Leg: ABD pad Dry Gauze - over the santyl areas with the ABD pad Wound #2 Right,Circumferential Lower Leg: ABD pad Dry Gauze - over the santyl areas with the ABD pad Dressing Change Frequency: Wound #1 Left,Circumferential Lower Leg: Other: - Tuesday and Friday - twice weekly - Fridays at [place] Wound #2 Right,Circumferential Lower Leg: Other: - Tuesday and Friday - twice weekly - Fridays at [place] Follow-up Appointments: Wound #1 Left,Circumferential Lower Leg: Return Appointment in 1 week. Wound #2 Right,Circumferential Lower Leg: Return Appointment in 1 week. Edema Control: Wound #1 Left,Circumferential Lower Leg: 3 Layer Compression System - Bilateral - May anchor top of wrap with dome paste, but only wrap around leg one time. Elevate legs to the level of the heart and pump ankles as often as possible Wound #2 Right,Circumferential Lower Leg: 3 Layer Compression System - Bilateral - May anchor top of wrap with dome paste, but only wrap around leg one time. Elevate legs to the level of the heart and pump ankles as often as possible Home Health: Wound #1 Left,Circumferential Lower Leg: CHANIA, KOCHANSKI ([id number]) [place] Visits - [place] Nurse may visit PRN to address patient s wound care needs. FACE TO FACE ENCOUNTER: MEDICARE and MEDICAID PATIENTS: I certify that this patient is under my care and that  I had a face-to-face encounter that meets the physician face-to-face encounter requirements with this patient on this date. The encounter with the patient was in whole or in part for the following MEDICAL CONDITION: (primary reason for [place]) MEDICAL NECESSITY: I certify, that based on my findings, NURSING services are a medically necessary home health service. HOME BOUND STATUS: I certify that my clinical findings support that this patient is homebound (i.e., Due to illness or injury, pt requires aid of supportive devices such as crutches, cane, wheelchairs, walkers, the use of special transportation or the assistance of another person to leave their place of residence. There is a normal inability to leave the home and doing so requires considerable and taxing effort. Other absences are for  medical reasons / religious services and are infrequent or of short duration when for other reasons). Please direct any NON-WOUND related issues/requests for orders to patient's Primary Care Physician Wound #2 Right,Circumferential Lower Leg: [place] Visits - [place] Nurse may visit PRN to address patient s wound care needs. FACE TO FACE ENCOUNTER: MEDICARE and MEDICAID PATIENTS: I certify that this patient is under my care and that I had a face-to-face encounter that meets the physician face-to-face encounter requirements with this patient on this date. The encounter with the patient was in whole or in part for the following MEDICAL CONDITION: (primary reason for [place]) MEDICAL NECESSITY: I certify, that based on my findings, NURSING services are a medically necessary home health service. HOME BOUND STATUS: I certify that my clinical findings support that this patient is homebound (i.e., Due to illness or injury, pt requires aid of supportive devices such as crutches, cane, wheelchairs, walkers, the use of special transportation or the assistance of another  person to leave their place of residence. There is a normal inability to leave the home and doing so requires considerable and taxing effort. Other absences are for medical reasons / religious services and are infrequent or of short duration when for other reasons). Please direct any NON-WOUND related issues/requests for orders to patient's Primary Care Physician General Notes: Apply betadine to the blister Follow-Up Appointments: A follow-up appointment should be scheduled. 1. discussed clinical findings and implications with pt ans son. all questions were answered. 2. betadine to right proximal calf blister prior to application of compression wraps. 3. advised continued adherence to leg elevation, compression pumps. Electronic Signature(s) Signed: 10/23/2016 3:39:52 PM By: Londell Moh FNP Previous Signature: 10/04/2016 4:21:25 PM Version By: Londell Moh FNP Entered By: Londell Moh on 10/23/2016 15:39:51 DAHLIA, NIFONG ([id number], LUCKENBAUGH ([id number]) -------------------------------------------------------------------------------- SuperBill Details Patient Name: LYNISE, PORR. Date of Service: 10/04/2016 Medical Record Patient Account Number: [id number] Number: Afful, RN, BSN, Treating RN: [phone] y.o. 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

## 2016-10-05 NOTE — Progress Notes (Signed)
Brooke Hopkins ([id number]) Visit Report for 10/04/2016 Arrival Information Details Patient Name: Brooke Hopkins, Brooke Hopkins. Date of Service: 10/04/2016 8:45 AM Medical Record Number: [id number] Patient Account Number: [id number] Date of Birth/Sex: Mar 27, 1951 (65 y.o. Female) Treating RN: Afful, RN, BSN, [facility] Primary Care Physician: FITZGERALD, DAVID Other Clinician: Referring Physician: FITZGERALD, DAVID Treating Physician/Extender: Loistine Chance in Treatment: 15 Visit Information History Since Last Visit All ordered tests and consults were completed: No Patient Arrived: Wheel Chair Added or deleted any medications: No Arrival Time: 08:44 Any new allergies or adverse reactions: No Accompanied By: son Had a fall or experienced change in No activities of daily living that may affect Transfer Assistance: None risk of falls: Patient Identification Verified: Yes Signs or symptoms of abuse/neglect since last No Secondary Verification Process Yes visito Completed: Hospitalized since last visit: No Patient Requires Transmission-Based No Has Dressing in Place as Prescribed: Yes Precautions: Has Compression in Place as Prescribed: Yes Patient Has Alerts: No Pain Present Now: No Electronic Signature(s) Signed: 10/04/2016 3:48:46 PM By: Regan Lemming BSN, RN Entered By: Regan Lemming on 10/04/2016 08:44:55 [name] ([id number]) -------------------------------------------------------------------------------- Encounter Discharge Information Details Patient Name: Brooke Hopkins. Date of Service: 10/04/2016 8:45 AM Medical Record Number: [id number] Patient Account Number: [id number] Date of Birth/Sex: 07-Jan-1951 (65 y.o. Female) Treating RN: Montey Hora Primary Care Physician: FITZGERALD, DAVID Other Clinician: Referring Physician: FITZGERALD, DAVID Treating Physician/Extender: Loistine Chance in Treatment: 15 Encounter Discharge Information Items Discharge Pain  Level: 0 Discharge Condition: Stable Ambulatory Status: Wheelchair Discharge Destination: Home Transportation: Private Auto Accompanied By: son Schedule Follow-up Appointment: Yes Medication Reconciliation completed No and provided to Patient/Care Brighten Orndoff: Patient Clinical Summary of Care: Declined Electronic Signature(s) Signed: 10/04/2016 10:11:38 AM By: Lorine Bears RCP, RRT, CHT Entered By: Lorine Bears on 10/04/2016 10:11:38 [name] ([id number]) -------------------------------------------------------------------------------- Lower Extremity Assessment Details Patient Name: Brooke Hopkins. Date of Service: 10/04/2016 8:45 AM Medical Record Number: [id number] Patient Account Number: [id number] Date of Birth/Sex: 1951/04/21 (65 y.o. Female) Treating RN: Afful, RN, BSN, [facility] Primary Care Physician: FITZGERALD, DAVID Other Clinician: Referring Physician: FITZGERALD, DAVID Treating Physician/Extender: Loistine Chance in Treatment: 15 Edema Assessment Assessed: [Left: No] [Right: No] E[Left: dema] [Right: :] Calf Left: Right: Point of Measurement: 32 cm From Medial Instep 54 cm 54 cm Ankle Left: Right: Point of Measurement: 11 cm From Medial Instep 33 cm 32.2 cm Vascular Assessment Claudication: Claudication Assessment [Left:None] [Right:None] Pulses: Popliteal Palpable: [Left:Yes] [Right:Yes] Posterior Tibial Dorsalis Pedis Palpable: [Left:Yes] [Right:Yes] Extremity colors, hair growth, and conditions: Extremity Color: [Left:Mottled] [Right:Mottled] Hair Growth on Extremity: [Left:No] [Right:No] Temperature of Extremity: [Left:Warm] [Right:Warm] Capillary Refill: [Left:< 3 seconds] [Right:< 3 seconds] Toe Nail Assessment Left: Right: Thick: Yes Yes Discolored: Yes Yes Deformed: No No Improper Length and Hygiene: No No Electronic Signature(s) MICAIAH, REMILLARD ([id number]) Signed: 10/04/2016 3:48:46 PM By:  Regan Lemming BSN, RN Entered By: Regan Lemming on 10/04/2016 08:46:08 [name] ([id number]) -------------------------------------------------------------------------------- Multi Wound Chart Details Patient Name: Brooke Hopkins. Date of Service: 10/04/2016 8:45 AM Medical Record Number: [id number] Patient Account Number: [id number] Date of Birth/Sex: Dec 29, 1951 (65 y.o. Female) Treating RN: Afful, RN, BSN, [facility] Primary Care Physician: FITZGERALD, DAVID Other Clinician: Referring Physician: FITZGERALD, DAVID Treating Physician/Extender: Loistine Chance in Treatment: 15 Vital Signs Height(in): 67 Pulse(bpm): 66 Weight(lbs): 300 Blood Pressure 181/67 (mmHg): Body Mass Index(BMI): 47 Temperature(F): 98.1 Respiratory Rate 19 (breaths/min): Photos: [1:No Photos] [2:No Photos] [N/A:N/A] Wound Location: [1:Left Lower Leg - Circumfernential] [2:Right Lower Leg -  Circumfernential] [N/A:N/A] Wounding Event: [1:Gradually Appeared] [2:Gradually Appeared] [N/A:N/A] Primary Etiology: [1:Lymphedema] [2:Lymphedema] [N/A:N/A] Date Acquired: [1:12/31/2015] [2:12/31/2015] [N/A:N/A] Weeks of Treatment: [1:15] [2:15] [N/A:N/A] Wound Status: [1:Open] [2:Open] [N/A:N/A] Clustered Wound: [1:No] [2:Yes] [N/A:N/A] Clustered Quantity: [1:N/A] [2:3] [N/A:N/A] Measurements L x W x D 6x11x0.2 [2:5x9.5x0.3] [N/A:N/A] (cm) Area (cm) : [1:51.836] [2:37.306] [N/A:N/A] Volume (cm) : [1:10.367] [2:11.192] [N/A:N/A] % Reduction in Area: [1:48.40%] [2:83.50%] [N/A:N/A] % Reduction in Volume: 48.40% [2:75.30%] [N/A:N/A] Classification: [1:Partial Thickness] [2:Partial Thickness] [N/A:N/A] Exudate Amount: [1:Large] [2:Large] [N/A:N/A] Exudate Type: [1:Serosanguineous] [2:Serosanguineous] [N/A:N/A] Exudate Color: [1:red, brown] [2:red, brown] [N/A:N/A] Foul Odor After [1:Yes] [2:Yes] [N/A:N/A] Cleansing: Odor Anticipated Due to No [2:No] [N/A:N/A] Product Use: Wound Margin: [1:Flat and  Intact] [2:Indistinct, nonvisible] [N/A:N/A] Granulation Amount: [1:None Present (0%)] [2:None Present (0%)] [N/A:N/A] Necrotic Amount: [1:Large (67-100%)] [2:Large (67-100%)] [N/A:N/A] Exposed Structures: [1:Fascia: No Fat: No Tendon: No] [2:Fascia: No Fat: No Tendon: No] [N/A:N/A] Muscle: No Muscle: No Joint: No Joint: No Bone: No Bone: No Limited to Skin Limited to Skin Breakdown Breakdown Epithelialization: None Small (1-33%) N/A Periwound Skin Texture: Edema: Yes Edema: Yes N/A Scarring: Yes Scarring: Yes Excoriation: No Excoriation: No Induration: No Induration: No Callus: No Callus: No Crepitus: No Crepitus: No Fluctuance: No Fluctuance: No Friable: No Friable: No Rash: No Rash: No Periwound Skin Maceration: Yes Maceration: Yes N/A Moisture: Moist: Yes Moist: Yes Dry/Scaly: No Dry/Scaly: No Periwound Skin Color: Hemosiderin Staining: Yes Hemosiderin Staining: Yes N/A Atrophie Blanche: No Atrophie Blanche: No Cyanosis: No Cyanosis: No Ecchymosis: No Ecchymosis: No Erythema: No Erythema: No Mottled: No Mottled: No Pallor: No Pallor: No Rubor: No Rubor: No Temperature: No Abnormality No Abnormality N/A Tenderness on Yes Yes N/A Palpation: Wound Preparation: Ulcer Cleansing: Other: Ulcer Cleansing: Other: N/A soap and water soap and water Topical Anesthetic Topical Anesthetic Applied: Other: lidocaine Applied: Other: lidocaine 4% 4% Treatment Notes Electronic Signature(s) Signed: 10/04/2016 3:48:46 PM By: Regan Lemming BSN, RN Entered By: Regan Lemming on 10/04/2016 08:55:34 [name] ([id number]) -------------------------------------------------------------------------------- [place] Details Patient Name: ASHLEYNICOLE, MCCLEES. Date of Service: 10/04/2016 8:45 AM Medical Record Number: [id number] Patient Account Number: [id number] Date of Birth/Sex: [birth year] (65 y.o. Female) Treating RN: Afful, RN, BSN, [facility] Primary  Care Physician: FITZGERALD, DAVID Other Clinician: Referring Physician: [facility], DAVID Treating Physician/Extender: Loistine Chance in Treatment: 15 Active Inactive Orientation to the Wound Care Program Nursing Diagnoses: Knowledge deficit related to the wound healing center program Goals: Patient/caregiver will verbalize understanding of the [place] Program Date Initiated: 06/21/2016 Goal Status: Active Interventions: Provide education on orientation to the wound center Notes: Wound/Skin Impairment Nursing Diagnoses: Impaired tissue integrity Goals: Patient/caregiver will verbalize understanding of skin care regimen Date Initiated: 06/21/2016 Goal Status: Active Ulcer/skin breakdown will have a volume reduction of 30% by week 4 Date Initiated: 06/21/2016 Goal Status: Active Ulcer/skin breakdown will have a volume reduction of 50% by week 8 Date Initiated: 06/21/2016 Goal Status: Active Ulcer/skin breakdown will have a volume reduction of 80% by week 12 Date Initiated: 06/21/2016 Goal Status: Active Ulcer/skin breakdown will heal within 14 weeks Date Initiated: 06/21/2016 Goal Status: Active LAIKLYN, PILKENTON ([id number]) Interventions: Assess patient/caregiver ability to obtain necessary supplies Assess patient/caregiver ability to perform ulcer/skin care regimen upon admission and as needed Assess ulceration(s) every visit Provide education on ulcer and skin care Notes: Electronic Signature(s) Signed: 10/04/2016 3:48:46 PM By: Regan Lemming BSN, RN Entered By: Regan Lemming on 10/04/2016 08:55:27 [name] ([id number]) -------------------------------------------------------------------------------- Pain Assessment Details Patient Name: KHAMILA, BASSINGER. Date  of Service: 10/04/2016 8:45 AM Medical Record Number: [id number] Patient Account Number: [id number] Date of Birth/Sex: 1951/11/28 (65 y.o. Female) Treating RN: Afful, RN, BSN,  [facility] Primary Care Physician: FITZGERALD, DAVID Other Clinician: Referring Physician: FITZGERALD, DAVID Treating Physician/Extender: Loistine Chance in Treatment: 15 Active Problems Location of Pain Severity and Description of Pain Patient Has Paino No Site Locations With Dressing Change: No Pain Management and Medication Current Pain Management: Electronic Signature(s) Signed: 10/04/2016 3:48:46 PM By: Regan Lemming BSN, RN Entered By: Regan Lemming on 10/04/2016 08:45:07 [name] ([id number]) -------------------------------------------------------------------------------- Patient/Caregiver Education Details Patient Name: ARIZBETH, CAWTHORN. Date of Service: 10/04/2016 8:45 AM Medical Record Number: [id number] Patient Account Number: [id number] Date of Birth/Gender: Jul 20, 1951 (65 y.o. Female) Treating RN: Montey Hora Primary Care Physician: FITZGERALD, DAVID Other Clinician: Referring Physician: [facility], DAVID Treating Physician/Extender: Loistine Chance in Treatment: 15 Education Assessment Education Provided To: Patient Education Topics Provided Venous: Handouts: Other: leg elevation for edema Methods: Explain/Verbal Responses: State content correctly Electronic Signature(s) Signed: 10/04/2016 5:08:51 PM By: Montey Hora Entered By: Montey Hora on 10/04/2016 09:38:40 [name] ([id number]) -------------------------------------------------------------------------------- Wound Assessment Details Patient Name: MELYNA, HURON. Date of Service: 10/04/2016 8:45 AM Medical Record Number: [id number] Patient Account Number: [id number] Date of Birth/Sex: 05-04-1951 (65 y.o. Female) Treating RN: Afful, RN, BSN, [facility] Primary Care Physician: FITZGERALD, DAVID Other Clinician: Referring Physician: FITZGERALD, DAVID Treating Physician/Extender: Loistine Chance in Treatment: 15 Wound Status Wound Number: 1 Primary Etiology:  Lymphedema Wound Location: Left Lower Leg - Wound Status: Open Circumfernential Wounding Event: Gradually Appeared Date Acquired: 12/31/2015 Weeks Of Treatment: 15 Clustered Wound: No Photos Photo Uploaded By: Regan Lemming on 10/04/2016 13:22:40 Wound Measurements Length: (cm) 6 Width: (cm) 11 Depth: (cm) 0.2 Area: (cm) 51.836 Volume: (cm) 10.367 % Reduction in Area: 48.4% % Reduction in Volume: 48.4% Epithelialization: None Tunneling: No Undermining: No Wound Description Classification: Partial Thickness Wound Margin: Flat and Intact Exudate Amount: Large Exudate Type: Serosanguineous KATARZYNA, WOLVEN ([id number]) Foul Odor After Cleansing: Yes Due to Product Use: No Exudate Color: red, brown Wound Bed Granulation Amount: None Present (0%) Exposed Structure Necrotic Amount: Large (67-100%) Fascia Exposed: No Necrotic Quality: Adherent Slough Fat Layer Exposed: No Tendon Exposed: No Muscle Exposed: No Joint Exposed: No Bone Exposed: No Limited to Skin Breakdown Periwound Skin Texture Texture Color No Abnormalities Noted: No No Abnormalities Noted: No Callus: No Atrophie Blanche: No Crepitus: No Cyanosis: No Excoriation: No Ecchymosis: No Fluctuance: No Erythema: No Friable: No Hemosiderin Staining: Yes Induration: No Mottled: No Localized Edema: Yes Pallor: No Rash: No Rubor: No Scarring: Yes Temperature / Pain Moisture Temperature: No Abnormality No Abnormalities Noted: No Tenderness on Palpation: Yes Dry / Scaly: No Maceration: Yes Moist: Yes Wound Preparation Ulcer Cleansing: Other: soap and water, Topical Anesthetic Applied: Other: lidocaine 4%, Treatment Notes Wound #1 (Left, Circumferential Lower Leg) 1. Cleansed with: Cleanse wound with antibacterial soap and water 2. Anesthetic Topical Lidocaine 4% cream to wound bed prior to debridement 4. Dressing Applied: Aquacel Ag Santyl Ointment 5. Secondary Dressing Applied ABD  Pad Dry Gauze TAKERIA, MARQUINA. ([id number]) 7. Secured with Tape 3 Layer Compression System - Bilateral Notes unna to anchor Electronic Signature(s) Signed: 10/04/2016 3:48:46 PM By: Regan Lemming BSN, RN Entered By: Regan Lemming on 10/04/2016 08:54:26 [name] ([id number]) -------------------------------------------------------------------------------- Wound Assessment Details Patient Name: ARAEYA, LAMB. Date of Service: 10/04/2016 8:45 AM Medical Record Number: [id number] Patient Account Number: [id number] Date of Birth/Sex: 1951/04/16 (65 y.o. Female) Treating RN: Afful,  RN, BSN, [facility] Primary Care Physician: FITZGERALD, DAVID Other Clinician: Referring Physician: FITZGERALD, DAVID Treating Physician/Extender: Loistine Chance in Treatment: 15 Wound Status Wound Number: 2 Primary Etiology: Lymphedema Wound Location: Right Lower Leg - Wound Status: Open Circumfernential Wounding Event: Gradually Appeared Date Acquired: 12/31/2015 Weeks Of Treatment: 15 Clustered Wound: Yes Photos Photo Uploaded By: Regan Lemming on 10/04/2016 13:23:11 Wound Measurements Length: (cm) 5 Width: (cm) 9.5 Depth: (cm) 0.3 Clustered Quantity: 3 Area: (cm) 37.306 Volume: (cm) 11.192 % Reduction in Area: 83.5% % Reduction in Volume: 75.3% Epithelialization: Small (1-33%) Tunneling: No Undermining: No Wound Description Classification: Partial Thickness Wound Margin: Indistinct, nonvisible Exudate Amount: Large RHESA, FORSBERG ([id number]) Foul Odor After Cleansing: Yes Due to Product Use: No Exudate Type: Serosanguineous Exudate Color: red, brown Wound Bed Granulation Amount: None Present (0%) Exposed Structure Necrotic Amount: Large (67-100%) Fascia Exposed: No Necrotic Quality: Adherent Slough Fat Layer Exposed: No Tendon Exposed: No Muscle Exposed: No Joint Exposed: No Bone Exposed: No Limited to Skin Breakdown Periwound Skin Texture Texture Color No  Abnormalities Noted: No No Abnormalities Noted: No Callus: No Atrophie Blanche: No Crepitus: No Cyanosis: No Excoriation: No Ecchymosis: No Fluctuance: No Erythema: No Friable: No Hemosiderin Staining: Yes Induration: No Mottled: No Localized Edema: Yes Pallor: No Rash: No Rubor: No Scarring: Yes Temperature / Pain Moisture Temperature: No Abnormality No Abnormalities Noted: No Tenderness on Palpation: Yes Dry / Scaly: No Maceration: Yes Moist: Yes Wound Preparation Ulcer Cleansing: Other: soap and water, Topical Anesthetic Applied: Other: lidocaine 4%, Treatment Notes Wound #2 (Right, Circumferential Lower Leg) 1. Cleansed with: Cleanse wound with antibacterial soap and water 2. Anesthetic Topical Lidocaine 4% cream to wound bed prior to debridement 4. Dressing Applied: Aquacel Ag Santyl Ointment 5. Secondary Dressing Applied ABD Pad JELITZA, MANNINEN. ([id number]) Dry Gauze 7. Secured with Tape 3 Layer Compression System - Bilateral Notes unna to anchor Electronic Signature(s) Signed: 10/04/2016 3:48:46 PM By: Regan Lemming BSN, RN Entered By: Regan Lemming on 10/04/2016 08:54:34 [name] ([id number]) -------------------------------------------------------------------------------- Vitals Details Patient Name: [name]. Date of Service: 10/04/2016 8:45 AM Medical Record Number: [id number] Patient Account Number: [id number] Date of Birth/Sex: 1951-10-05 (65 y.o. Female) Treating RN: Afful, RN, BSN, [facility] Primary Care Physician: FITZGERALD, DAVID Other Clinician: Referring Physician: FITZGERALD, DAVID Treating Physician/Extender: Loistine Chance in Treatment: 15 Vital Signs Time Taken: 08:45 Temperature (F): 98.1 Height (in): 67 Pulse (bpm): 66 Weight (lbs): 300 Respiratory Rate (breaths/min): 19 Body Mass Index (BMI): 47 Blood Pressure (mmHg): 181/67 Reference Range: 80 - 120 mg / dl Electronic Signature(s) Signed:  10/04/2016 3:48:46 PM By: Regan Lemming BSN, RN Entered By: Regan Lemming on 10/04/2016 08:51:38

## 2016-10-07 ENCOUNTER — Ambulatory Visit (INDEPENDENT_AMBULATORY_CARE_PROVIDER_SITE_OTHER): Payer: 59 | Admitting: Vascular Surgery

## 2016-10-11 ENCOUNTER — Encounter: Payer: 59 | Admitting: Nurse Practitioner

## 2016-10-11 DIAGNOSIS — L97212 Non-pressure chronic ulcer of right calf with fat layer exposed: Secondary | ICD-10-CM | POA: Diagnosis not present

## 2016-10-11 DIAGNOSIS — L97222 Non-pressure chronic ulcer of left calf with fat layer exposed: Secondary | ICD-10-CM | POA: Diagnosis not present

## 2016-10-11 DIAGNOSIS — E11622 Type 2 diabetes mellitus with other skin ulcer: Secondary | ICD-10-CM | POA: Diagnosis not present

## 2016-10-11 DIAGNOSIS — I890 Lymphedema, not elsewhere classified: Secondary | ICD-10-CM | POA: Diagnosis not present

## 2016-10-12 NOTE — Progress Notes (Signed)
Brooke Hopkins, Brooke Hopkins ([id number]) Visit Report for 10/11/2016 Chief Complaint Document Details Patient Name: Brooke Hopkins, Brooke Hopkins 10/11/2016 8:45 Date of Service: AM Medical Record [id number] Number: Patient Account Number: [id number] [birth year] (65 y.o. Treating RN: Brooke Hopkins Date of Birth/Sex: Female) Other Clinician: Primary Care Physician: Brooke Hopkins Treating Brooke Hopkins Referring Physician: FITZGERALD, Hopkins Physician/Extender: Weeks in Treatment: 16 Information Obtained from: Patient Chief Complaint Patients presents for treatment of an open diabetic ulcer and significant lymphedema which she's had for about two years Electronic Signature(s) Signed: 10/11/2016 5:31:30 PM By: Brooke Moh FNP Entered By: Brooke Hopkins on 10/11/2016 16:50:52 Brooke Hopkins ([id number]) -------------------------------------------------------------------------------- HPI Details Patient Name: Brooke Hopkins, Brooke Hopkins 10/11/2016 8:45 Date of Service: AM Medical Record [id number] Number: Patient Account Number: [id number] [birth year] (65 y.o. Treating RN: Brooke Hopkins Date of Birth/Sex: Female) Other Clinician: Primary Care Physician: Brooke Hopkins Treating Brooke Hopkins Referring Physician: FITZGERALD, Hopkins Physician/Extender: Weeks in Treatment: 16 History of Present Illness Location: massive swelling of both lower extremities and ulceration both lower extremities Quality: Patient reports experiencing a dull pain to affected area(s). Severity: Patient states wound are getting worse. Duration: Patient has had the wound for >2 years prior to seeking treatment at the wound center Timing: Pain in wound is constant (hurts all the time) Context: The wound appeared gradually over time Modifying Factors: Other treatment(s) tried include:she has a lymphedema pump but uses it seldom and she's had several course of antibiotics Associated Signs and Symptoms: Patient  reports having difficulty standing for long periods. HPI Description: 65 year old patient seen by Dr. Ola Hopkins of infectious disease who has been following up for left lower extremity cellulitis and ulcer with recurrent bilateral lower extremity problems for several months. Recently she had a large right lower extremity bullae which opened out and has been ulcerated. She has seen the vascular group and has been getting Unna's wraps and has a lymphedema pump used in the past. Her prior cultures were positive for Pseudomonas, Proteus and was treated with amoxicillin. He has also been treated with 2 weeks course of ciprofloxacin and amoxicillin.. Increase of Lasix dose helped with the edema and echo showed no systolic CHF but may be diastolic problems. Past medical history significant for diabetes mellitus type 2, venous stasis ulcer, obesity, diabetic peripheral neuropathy, status post back surgery, cholecystectomy, hysterectomy, arthroscopy of the knee. He is a former smoker and quit smoking in 1984. The patient has seen Dr. Delana Hopkins who did not recommend any arterial or venous duplex studies and has been using Unna's wraps and also recommended a lymphedema pump. she has been very noncompliant with using these. 06/28/2016 -- the patient is still on antibiotics as prescribed by Dr. Ola Hopkins and he is asked her to take it for 3 weeks. The patient also says she has a lot of redness and pain in the folds of her thigh and lower extremity and this is very painful. 07/26/2016 -- the patient is off antibiotics and has been getting dressing changes 3 times a week. 08/09/2016 -- is been on Cipro and is taking potassium supplements along with her Lasix and is going to be seeing Dr. Ola Hopkins for a consult return visit only in November 2017. 08/23/2016 -- she was admitted to the hospital last week and I have reviewed these reports in detail. She was seen by Dr. Ola Hopkins who noted a Pseudomonas  infection which was resistant to ciprofloxacin and discharged her on Ceftazidime 1 g IV every 12 hourly anyone days. The antibiotic was to be stopped on  September 11 and he would see her back in the clinic. Brooke Hopkins, Brooke Hopkins ([id number]) 08/30/2016 -- had a communication from Dr. Ola Hopkins that he would extend her antibiotics by a week if she continued to look like cellulitis was persisting. 09/13/2016 -- the PICC line is out and antibiotics have stopped. 10/04/16: returns today for f/u. reports utilizes compression pump twice daily. she is compliant with her compression therapy. she reports a new blister on the right proximal posterior calf. no systemic s/s of infection. 10/11/16: returns today for f/u. she reports adherence to her compression pumps, but there is no improvement regarding her BLE edema. there is weeping of fluid. denies fever, chills, body aches or malaise. Electronic Signature(s) Signed: 10/11/2016 5:31:30 PM By: Brooke Moh FNP Entered By: Brooke Hopkins on 10/11/2016 16:52:26 Brooke Hopkins ([id number]) -------------------------------------------------------------------------------- Physical Exam Details Patient Name: Brooke Hopkins, Brooke Hopkins 10/11/2016 8:45 Date of Service: AM Medical Record [id number] Number: Patient Account Number: [id number] 12-Jan-1951 (65 y.o. Treating RN: Brooke Hopkins Date of Birth/Sex: Female) Other Clinician: Primary Care Physician: Brooke Hopkins Treating Brooke Hopkins Referring Physician: FITZGERALD, Hopkins Physician/Extender: Weeks in Treatment: 33 Constitutional morbidly obese chronically ill appearing elderly female in NAD>. Ears, Nose, Mouth, and Throat Patient can hear normal speaking tones without difficulty.Marland Kitchen Respiratory Respiratory effort is easy and symmetric bilaterally. Rate is normal at rest and on room air.. Cardiovascular Edema present in both extremities, 3-4+ non pitting BLE. there is tissue  fibrosis.Marland Kitchen Psychiatric Alert and oriented times 3.. No evidence of depression, anxiety, or agitation. Calm, cooperative, and communicative. Appropriate interactions and affect.. Electronic Signature(s) Signed: 10/11/2016 5:31:30 PM By: Brooke Moh FNP Entered By: Brooke Hopkins on 10/11/2016 16:53:25 Brooke Hopkins ([id number]) -------------------------------------------------------------------------------- Physician Orders Details Patient Name: Brooke Hopkins, Brooke Hopkins 10/11/2016 8:45 Date of Service: AM Medical Record [id number] Number: Patient Account Number: [id number] 09-25-1951 (65 y.o. Treating RN: Brooke Hopkins Date of Birth/Sex: Female) Other Clinician: Primary Care Physician: Brooke Hopkins Treating Brooke Hopkins Referring Physician: FITZGERALD, Hopkins Physician/Extender: Weeks in Treatment: 16 Verbal / Phone Orders: No Diagnosis Coding Wound Cleansing Wound #1 Left,Circumferential Lower Leg o Cleanse wound with mild soap and water o May Shower, gently pat wound dry prior to applying new dressing. o May shower with protection. Wound #2 Right,Circumferential Lower Leg o Cleanse wound with mild soap and water o May Shower, gently pat wound dry prior to applying new dressing. o May shower with protection. Anesthetic Wound #1 Left,Circumferential Lower Leg o Topical Lidocaine 4% cream applied to wound bed prior to debridement Wound #2 Right,Circumferential Lower Leg o Topical Lidocaine 4% cream applied to wound bed prior to debridement Skin Barriers/Peri-Wound Care Wound #1 Left,Circumferential Lower Leg o Other: - Lotrisone cream in folds at knees and on reddened areas on legs (Nystatin used in clinic) Wound #2 Right,Circumferential Lower Leg o Other: - Lotrisone cream in folds at knees and on reddened areas on legs (Nystatin used in clinic) Primary Wound Dressing Wound #1 Left,Circumferential Lower Leg o Santyl Ointment - santyl on  the posterior wounds only o Aquacel Ag - aquacel ag on all other open areas Wound #2 Right,Circumferential Lower Leg o Santyl Ointment - santyl on the posterior wounds only o Aquacel Ag - aquacel ag on all other open areas Brooke Hopkins, Brooke Hopkins. ([id number]) Secondary Dressing Wound #1 Left,Circumferential Lower Leg o ABD pad o Dry Gauze - over the santyl areas with the ABD pad Wound #2 Right,Circumferential Lower Leg o ABD pad o Dry Gauze - over the santyl areas with the ABD pad  Dressing Change Frequency Wound #1 Left,Circumferential Lower Leg o Other: - Tuesday and Friday - twice weekly - Fridays at [place] Wound #2 Right,Circumferential Lower Leg o Other: - Tuesday and Friday - twice weekly - Fridays at [place] Follow-up Appointments Wound #1 Left,Circumferential Lower Leg o Return Appointment in 1 week. Wound #2 Right,Circumferential Lower Leg o Return Appointment in 1 week. Edema Control Wound #1 Left,Circumferential Lower Leg o 3 Layer Compression System - Bilateral - May anchor top of wrap with dome paste, but only wrap around leg one time. o Elevate legs to the level of the heart and pump ankles as often as possible Wound #2 Right,Circumferential Lower Leg o 3 Layer Compression System - Bilateral - May anchor top of wrap with dome paste, but only wrap around leg one time. o Elevate legs to the level of the heart and pump ankles as often as possible Home Health Wound #1 Left,Circumferential Lower Leg o [place] Visits - [place] Nurse may visit PRN to address patientos wound care needs. o FACE TO FACE ENCOUNTER: MEDICARE and MEDICAID PATIENTS: I certify that this patient is under my care and that I had a face-to-face encounter that meets the physician face-to-face encounter requirements with this patient on this date. The encounter with the patient was in whole or in part for the  following MEDICAL CONDITION: (primary reason for [place]) MEDICAL NECESSITY: I certify, that based on my findings, NURSING services are a medically necessary home health service. HOME BOUND STATUS: I certify that my clinical findings support that this patient is homebound (i.e., Due to illness or injury, pt requires aid of supportive devices such as crutches, cane, wheelchairs, walkers, the use of special transportation or the assistance of another person to leave their place of residence. There is a normal inability to leave the home and doing so requires considerable and taxing effort. MAZZIE, BRODRICK ([id number]) absences are for medical reasons / religious services and are infrequent or of short duration when for other reasons). o Please direct any NON-WOUND related issues/requests for orders to patient's Primary Care Physician Wound #2 Right,Circumferential Lower Leg o [place] Visits - [place] Nurse may visit PRN to address patientos wound care needs. o FACE TO FACE ENCOUNTER: MEDICARE and MEDICAID PATIENTS: I certify that this patient is under my care and that I had a face-to-face encounter that meets the physician face-to-face encounter requirements with this patient on this date. The encounter with the patient was in whole or in part for the following MEDICAL CONDITION: (primary reason for [place]) MEDICAL NECESSITY: I certify, that based on my findings, NURSING services are a medically necessary home health service. HOME BOUND STATUS: I certify that my clinical findings support that this patient is homebound (i.e., Due to illness or injury, pt requires aid of supportive devices such as crutches, cane, wheelchairs, walkers, the use of special transportation or the assistance of another person to leave their place of residence. There is a normal inability to leave the home and doing so requires considerable and taxing effort.  Other absences are for medical reasons / religious services and are infrequent or of short duration when for other reasons). o Please direct any NON-WOUND related issues/requests for orders to patient's Primary Care Physician Electronic Signature(s) Signed: 10/11/2016 5:31:30 PM By: Brooke Moh FNP Signed: 10/11/2016 5:52:12 PM By: Brooke Hopkins Entered By: Brooke Hopkins on 10/11/2016 10:07:06 Erb, Destinie M. (  [id number]) -------------------------------------------------------------------------------- Problem List Details Patient Name: DESHAWNA, MCNEECE 10/11/2016 8:45 Date of Service: AM Medical Record [id number] Number: Patient Account Number: [id number] [birth year] (65 y.o. 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 10/11/2016 8:45 Date of Service: AM Medical Record [id number] Number: Patient Account Number: [id number] Mar 04, 1951 (65 y.o. Treating RN: Brooke Hopkins Date of  Birth/Sex: Female) Other Clinician: Primary Care Physician: Brooke Hopkins Treating Brooke Hopkins Referring Physician: FITZGERALD, Hopkins Physician/Extender: Weeks in Treatment: 16 Subjective Chief Complaint Information obtained from Patient Patients presents for treatment of an open diabetic ulcer and significant lymphedema which she's had for about two years History of Present Illness (HPI) The following HPI elements were documented for the patient's wound: Location: massive swelling of both lower extremities and ulceration both lower extremities Quality: Patient reports experiencing a dull pain to affected area(s). Severity: Patient states wound are getting worse. Duration: Patient has had the wound for >2 years prior to seeking treatment at the wound center Timing: Pain in wound is constant (hurts all the time) Context: The wound appeared gradually over time Modifying Factors: Other treatment(s) tried include:she has a lymphedema pump but uses it seldom and she's had several course of antibiotics Associated Signs and Symptoms: Patient reports having difficulty standing for long periods. 65 year old patient seen by Dr. Ola Hopkins of infectious disease who has been following up for left lower extremity cellulitis and ulcer with recurrent bilateral lower extremity problems for several months. Recently she had a large right lower extremity bullae which opened out and has been ulcerated. She has seen the vascular group and has been getting Unna's wraps and has a lymphedema pump used in the past. Her prior cultures were positive for Pseudomonas, Proteus and was treated with amoxicillin. He has also been treated with 2 weeks course of ciprofloxacin and amoxicillin.. Increase of Lasix dose helped with the edema and echo showed no systolic CHF but may be diastolic problems. Past medical history significant for diabetes mellitus type 2, venous stasis ulcer, obesity, diabetic  peripheral neuropathy, status post back surgery, cholecystectomy, hysterectomy, arthroscopy of the knee. He is a former smoker and quit smoking in 1984. The patient has seen Dr. Delana Hopkins who did not recommend any arterial or venous duplex studies and has been using Unna's wraps and also recommended a lymphedema pump. she has been very noncompliant with using these. 06/28/2016 -- the patient is still on antibiotics as prescribed by Dr. Ola Hopkins and he is asked her to take it for 3 weeks. The patient also says she has a lot of redness and pain in the folds of her thigh and lower extremity and this is very painful. Brooke Hopkins, Brooke Hopkins ([id number]) 07/26/2016 -- the patient is off antibiotics and has been getting dressing changes 3 times a week. 08/09/2016 -- is  been on Cipro and is taking potassium supplements along with her Lasix and is going to be seeing Dr. Ola Hopkins for a consult return visit only in November 2017. 08/23/2016 -- she was admitted to the hospital last week and I have reviewed these reports in detail. She was seen by Dr. Ola Hopkins who noted a Pseudomonas infection which was resistant to ciprofloxacin and discharged her on Ceftazidime 1 g IV every 12 hourly anyone days. The antibiotic was to be stopped on September 11 and he would see her back in the clinic. 08/30/2016 -- had a communication from Dr. Ola Hopkins that he would extend her antibiotics by a week if she continued to look like cellulitis was persisting. 09/13/2016 -- the PICC line is out and antibiotics have stopped. 10/04/16: returns today for f/u. reports utilizes compression pump twice daily. she is compliant with her compression therapy. she reports a new blister on the right proximal posterior calf. no systemic s/s of infection. 10/11/16: returns today for f/u. she reports adherence to her compression pumps, but there is no improvement regarding her BLE edema. there is weeping of fluid. denies fever, chills, body  aches or malaise. Objective Constitutional morbidly obese chronically ill appearing elderly female in NAD>. Vitals Time Taken: 9:19 AM, Height: 67 in, Weight: 300 lbs, BMI: 47, Temperature: 97.7 F, Pulse: 69 bpm, Respiratory Rate: 20 breaths/min, Blood Pressure: 219/68 mmHg. Ears, Nose, Mouth, and Throat Patient can hear normal speaking tones without difficulty.Marland Kitchen Respiratory Respiratory effort is easy and symmetric bilaterally. Rate is normal at rest and on room air.. Cardiovascular Edema present in both extremities, 3-4+ non pitting BLE. there is tissue fibrosis.Marland Kitchen Psychiatric Alert and oriented times 3.. No evidence of depression, anxiety, or agitation. Calm, cooperative, and communicative. Appropriate interactions and affect.. Integumentary (Hair, Skin) Wound #1 status is Open. Original cause of wound was Gradually Appeared. The wound is located on the [place]. ([id number]) Left,Circumferential Lower Leg. The wound measures 2.3cm length x 2cm width x 0.2cm depth; 3.613cm^2 area and 0.723cm^3 volume. The wound is limited to skin breakdown. There is no tunneling noted. There is a large amount of serosanguineous drainage noted. The wound margin is flat and intact. There is small (1-33%) red granulation within the wound bed. There is a large (67-100%) amount of necrotic tissue within the wound bed including Adherent Slough. The periwound skin appearance exhibited: Localized Edema, Scarring, Maceration, Moist, Hemosiderin Staining. The periwound skin appearance did not exhibit: Callus, Crepitus, Excoriation, Fluctuance, Friable, Induration, Rash, Dry/Scaly, Atrophie Blanche, Cyanosis, Ecchymosis, Mottled, Pallor, Rubor, Erythema. Periwound temperature was noted as No Abnormality. The periwound has tenderness on palpation. Wound #2 status is Open. Original cause of wound was Gradually Appeared. The wound is located on the Right,Circumferential Lower Leg. The wound measures 2.1cm  length x 7.5cm width x 0.6cm depth; 12.37cm^2 area and 7.422cm^3 volume. The wound is limited to skin breakdown. There is no tunneling or undermining noted. There is a large amount of serosanguineous drainage noted. The wound margin is indistinct and nonvisible. There is small (1-33%) red granulation within the wound bed. There is a large (67- 100%) amount of necrotic tissue within the wound bed including Adherent Slough. The periwound skin appearance exhibited: Localized Edema, Scarring, Maceration, Moist, Hemosiderin Staining. The periwound skin appearance did not exhibit: Callus, Crepitus, Excoriation, Fluctuance, Friable, Induration, Rash, Dry/Scaly, Atrophie Blanche, Cyanosis, Ecchymosis, Mottled, Pallor, Rubor, Erythema. Periwound temperature was noted as No Abnormality. The periwound has tenderness on palpation. 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 ([id number]) Procedures Wound #1 Wound #1 is a Lymphedema located on the Left,Circumferential Lower Leg . There was a Three Layer Compression Therapy Procedure by Brooke Civil, RN. Post procedure Diagnosis Wound #1: Same as Pre-Procedure Wound #2 Wound #2 is a Lymphedema located on the Right,Circumferential Lower Leg . There was a  Three Layer Compression Therapy Procedure by Brooke Civil, RN. Post procedure Diagnosis Wound #2: Same as Pre-Procedure Plan Wound Cleansing: Wound #1 Left,Circumferential Lower Leg: Cleanse wound with mild soap and water May Shower, gently pat wound dry prior to applying new dressing. May shower with protection. Wound #2 Right,Circumferential Lower Leg: Cleanse wound with mild soap and water May Shower, gently pat wound dry prior to applying new dressing. May shower with protection. Anesthetic: Wound #1 Left,Circumferential Lower Leg: Topical Lidocaine 4% cream applied to wound bed prior to debridement Wound #2 Right,Circumferential Lower Leg: Topical Lidocaine 4% cream applied to wound bed prior to debridement Skin Barriers/Peri-Wound Care: Wound #1 Left,Circumferential Lower Leg: Other: - Lotrisone cream in folds at knees and on reddened areas on legs (Nystatin used in clinic) Wound #2 Right,Circumferential Lower Leg: Other: - Lotrisone cream in folds at knees and on reddened areas on legs (Nystatin used in clinic) Primary Wound Dressing: Wound #1 Left,Circumferential Lower Leg: Santyl Ointment - santyl on the posterior wounds only Aquacel Ag - aquacel ag on all other open areas Wound #2 Right,Circumferential Lower Leg: Santyl Ointment - santyl on the posterior wounds only Aquacel Ag - aquacel ag on all other open areas Secondary Dressing: Wound #1 Left,Circumferential Lower Leg: ABD pad EMORIE, Brooke Hopkins ([id number]) Dry Gauze - over the santyl areas with the ABD pad Wound #2 Right,Circumferential Lower Leg: ABD pad Dry Gauze - over the santyl areas with the ABD pad Dressing Change Frequency: Wound #1 Left,Circumferential Lower Leg: Other: - Tuesday and Friday - twice weekly - Fridays at [place] Wound #2 Right,Circumferential Lower Leg: Other: - Tuesday and Friday - twice weekly - Fridays at [place] Follow-up Appointments: Wound #1  Left,Circumferential Lower Leg: Return Appointment in 1 week. Wound #2 Right,Circumferential Lower Leg: Return Appointment in 1 week. Edema Control: Wound #1 Left,Circumferential Lower Leg: 3 Layer Compression System - Bilateral - May anchor top of wrap with dome paste, but only wrap around leg one time. Elevate legs to the level of the heart and pump ankles as often as possible Wound #2 Right,Circumferential Lower Leg: 3 Layer Compression System - Bilateral - May anchor top of wrap with dome paste, but only wrap around leg one time. Elevate legs to the level of the heart and pump ankles as often as possible Home Health: Wound #1 Left,Circumferential Lower Leg: Continue Home Health Visits - [place] Nurse may visit PRN to address patient s wound care needs. FACE TO FACE ENCOUNTER: MEDICARE and MEDICAID PATIENTS: I certify that this patient  is under my care and that I had a face-to-face encounter that meets the physician face-to-face encounter requirements with this patient on this date. The encounter with the patient was in whole or in part for the following MEDICAL CONDITION: (primary reason for [place]) MEDICAL NECESSITY: I certify, that based on my findings, NURSING services are a medically necessary home health service. HOME BOUND STATUS: I certify that my clinical findings support that this patient is homebound (i.e., Due to illness or injury, pt requires aid of supportive devices such as crutches, cane, wheelchairs, walkers, the use of special transportation or the assistance of another person to leave their place of residence. There is a normal inability to leave the home and doing so requires considerable and taxing effort. Other absences are for medical reasons / religious services and are infrequent or of short duration when for other reasons). Please direct any NON-WOUND related issues/requests for orders to patient's Primary Care Physician Wound #2  Right,Circumferential Lower Leg: [place] Visits - [place] Nurse may visit PRN to address patient s wound care needs. FACE TO FACE ENCOUNTER: MEDICARE and MEDICAID PATIENTS: I certify that this patient is under my care and that I had a face-to-face encounter that meets the physician face-to-face encounter requirements with this patient on this date. The encounter with the patient was in whole or in part for the following MEDICAL CONDITION: (primary reason for [place]) MEDICAL NECESSITY: I certify, that based on my findings, NURSING services are a medically necessary home health service. HOME BOUND STATUS: I certify that my clinical findings support that this patient is homebound (i.e., Due to illness or injury, pt requires aid of supportive devices such as crutches, cane, wheelchairs, walkers, the use of special transportation or the assistance of another person to leave their place of residence. There is a normal inability to leave the home and doing so requires considerable and taxing effort. Other absences are for medical reasons / religious services and are infrequent or of short duration when for other reasons). Brooke Hopkins, Brooke Hopkins ([id number]) Please direct any NON-WOUND related issues/requests for orders to patient's Primary Care Physician Follow-Up Appointments: A follow-up appointment should be scheduled. 1. discussed clinical findings and implications with pt and son. all questions were answered. Electronic Signature(s) Signed: 10/11/2016 5:31:30 PM By: Brooke Moh FNP Entered By: Brooke Hopkins on 10/11/2016 16:54:05 Brooke Hopkins ([id number]) -------------------------------------------------------------------------------- SuperBill Details Patient Name: Brooke Hopkins, Brooke Hopkins. Date of Service: 10/11/2016 Medical Record Number: [id number] Patient Account Number: [id number] Date of Birth/Sex: [birth year] (65 y.o. 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

## 2016-10-12 NOTE — Progress Notes (Signed)
Brooke Hopkins ([id number]) Visit Report for 10/11/2016 Clinic Level of Care Assessment Details Patient Name: Brooke Hopkins, Brooke Hopkins 10/11/2016 8:45 Date of Service: AM Medical Record [id number] Number: Patient Account Number: [id number] 1951/06/15 (65 y.o. Treating RN: Carole Civil Date of Birth/Sex: Female) Other Clinician: Primary Care Physician: FITZGERALD, DAVID Treating Londell Moh Referring Physician: FITZGERALD, DAVID Physician/Extender: Weeks in Treatment: 16 Clinic Level of Care Assessment Items TOOL 4 Quantity Score X - Use when only an EandM is performed on FOLLOW-UP visit 1 0 ASSESSMENTS - Nursing Assessment / Reassessment X - Reassessment of Co-morbidities (includes updates in patient status) 1 10 X - Reassessment of Adherence to Treatment Plan 1 5 ASSESSMENTS - Wound and Skin Assessment / Reassessment []  - Simple Wound Assessment / Reassessment - one wound 0 []  - Complex Wound Assessment / Reassessment - multiple wounds 0 []  - Dermatologic / Skin Assessment (not related to wound area) 0 ASSESSMENTS - Focused Assessment X - Circumferential Edema Measurements - multi extremities 1 5 []  - Nutritional Assessment / Counseling / Intervention 0 X - Lower Extremity Assessment (monofilament, tuning fork, pulses) 1 5 []  - Peripheral Arterial Disease Assessment (using hand held doppler) 0 ASSESSMENTS - Ostomy and/or Continence Assessment and Care []  - Incontinence Assessment and Management 0 []  - Ostomy Care Assessment and Management (repouching, etc.) 0 PROCESS - Coordination of Care X - Simple Patient / Family Education for ongoing care 1 15 []  - Complex (extensive) Patient / Family Education for ongoing care 0 Brooke Hopkins, Brooke Hopkins ([id number]) []  - Staff obtains Consents, Records, Test Results / Process Orders 0 X - Staff telephones HHA, Nursing Homes / Clarify orders / etc 1 10 []  - Routine Transfer to another Facility (non-emergent condition) 0 []  - Routine  Hospital Admission (non-emergent condition) 0 []  - New Admissions / Biomedical engineer / Ordering NPWT, Apligraf, etc. 0 []  - Emergency Hospital Admission (emergent condition) 0 X - Simple Discharge Coordination 1 10 []  - Complex (extensive) Discharge Coordination 0 PROCESS - Special Needs []  - Pediatric / Minor Patient Management 0 []  - Isolation Patient Management 0 []  - Hearing / Language / Visual special needs 0 []  - Assessment of Community assistance (transportation, D/C planning, etc.) 0 []  - Additional assistance / Altered mentation 0 []  - Support Surface(s) Assessment (bed, cushion, seat, etc.) 0 INTERVENTIONS - Wound Cleansing / Measurement []  - Simple Wound Cleansing - one wound 0 X - Complex Wound Cleansing - multiple wounds 2 5 X - Wound Imaging (photographs - any number of wounds) 1 5 []  - Wound Tracing (instead of photographs) 0 []  - Simple Wound Measurement - one wound 0 X - Complex Wound Measurement - multiple wounds 2 5 INTERVENTIONS - Wound Dressings X - Small Wound Dressing one or multiple wounds 2 10 []  - Medium Wound Dressing one or multiple wounds 0 []  - Large Wound Dressing one or multiple wounds 0 []  - Application of Medications - topical 0 Brooke Hopkins, Brooke Hopkins. ([id number]) []  - Application of Medications - injection 0 INTERVENTIONS - Miscellaneous []  - External ear exam 0 []  - Specimen Collection (cultures, biopsies, blood, body fluids, etc.) 0 []  - Specimen(s) / Culture(s) sent or taken to Lab for analysis 0 []  - Patient Transfer (multiple staff / Harrel Lemon Lift / Similar devices) 0 []  - Simple Staple / Suture removal (25 or less) 0 []  - Complex Staple / Suture removal (26 or more) 0 []  - Hypo / Hyperglycemic Management (close monitor of Blood Glucose) 0 []  - Ankle /  Brachial Index (ABI) - do not check if billed separately 0 X - Vital Signs 1 5 Has the patient been seen at the hospital within the last three years: Yes Total Score: 110 Level Of Care:  New/Established - Level 3 Electronic Signature(s) Signed: 10/11/2016 5:52:12 PM By: Carole Civil Entered By: Carole Civil on 10/11/2016 10:15:36 Brooke Hopkins ([id number]) -------------------------------------------------------------------------------- Compression Therapy Details Patient Name: Brooke Hopkins, Brooke Hopkins 10/11/2016 8:45 Date of Service: AM Medical Record [id number] Number: Patient Account Number: [id number] Sep 03, 1951 (65 y.o. Treating RN: Carole Civil Date of Birth/Sex: Female) Other Clinician: Primary Care Physician: FITZGERALD, DAVID Treating Londell Moh Referring Physician: FITZGERALD, DAVID Physician/Extender: Suella Grove in Treatment: 16 Compression Therapy Performed for Wound Wound #1 Left,Circumferential Lower Leg Assessment: Performed By: Clinician Carole Civil, RN Compression Type: Three Layer Post Procedure Diagnosis Same as Pre-procedure Electronic Signature(s) Signed: 10/11/2016 5:52:12 PM By: Carole Civil Entered By: Carole Civil on 10/11/2016 10:12:31 Brooke Hopkins ([id number]) -------------------------------------------------------------------------------- Compression Therapy Details Patient Name: Brooke Hopkins 10/11/2016 8:45 Date of Service: AM Medical Record [id number] Number: Patient Account Number: [id number] October 21, 1951 (65 y.o. Treating RN: Carole Civil Date of Birth/Sex: Female) Other Clinician: Primary Care Physician: FITZGERALD, DAVID Treating Londell Moh Referring Physician: FITZGERALD, DAVID Physician/Extender: Suella Grove in Treatment: 16 Compression Therapy Performed for Wound Wound #2 Right,Circumferential Lower Leg Assessment: Performed By: Clinician Carole Civil, RN Compression Type: Three Layer Post Procedure Diagnosis Same as Pre-procedure Electronic Signature(s) Signed: 10/11/2016 5:52:12 PM By: Carole Civil Entered By: Carole Civil on 10/11/2016 10:14:02 Brooke Hopkins  ([id number]) -------------------------------------------------------------------------------- Encounter Discharge Information Details Patient Name: Brooke Hopkins, Brooke Hopkins 10/11/2016 8:45 Date of Service: AM Medical Record [id number] Number: Patient Account Number: [id number] 04-17-1951 (65 y.o. Treating RN: Carole Civil Date of Birth/Sex: Female) Other Clinician: Primary Care Physician: FITZGERALD, DAVID Treating Londell Moh Referring Physician: FITZGERALD, DAVID Physician/Extender: Weeks in Treatment: 16 Encounter Discharge Information Items Discharge Pain Level: 0 Discharge Condition: Stable Ambulatory Status: Wheelchair Discharge Destination: Home Transportation: Private Auto Accompanied By: son Schedule Follow-up Appointment: Yes Medication Reconciliation completed No and provided to Patient/Care Delcie Ruppert: Patient Clinical Summary of Care: Declined Electronic Signature(s) Signed: 10/11/2016 5:52:12 PM By: Carole Civil Previous Signature: 10/11/2016 10:54:06 AM Version By: Ruthine Dose Entered By: Carole Civil on 10/11/2016 10:58:34 Brooke Hopkins ([id number]) -------------------------------------------------------------------------------- Lower Extremity Assessment Details Patient Name: Brooke Hopkins, Brooke Hopkins 10/11/2016 8:45 Date of Service: AM Medical Record [id number] Number: Patient Account Number: [id number] 26-Feb-1951 (65 y.o. Treating RN: Carole Civil Date of Birth/Sex: Female) Other Clinician: Primary Care Physician: FITZGERALD, DAVID Treating Londell Moh Referring Physician: FITZGERALD, DAVID Physician/Extender: Suella [place] in Treatment: 16 Edema Assessment Assessed: [Left: No] [Right: No] Edema: [Left: Yes] [Right: Yes] Calf Left: Right: Point of Measurement: 32 cm From Medial Instep 58.5 cm 58 cm Ankle Left: Right: Point of Measurement: 11 cm From Medial Instep 34 cm 342.7 cm Vascular Assessment Pulses: Posterior Tibial Palpable:  [Left:No] [Right:No] Dorsalis Pedis Palpable: [Left:No] [Right:No] Extremity colors, hair growth, and conditions: Extremity Color: [Left:Hyperpigmented] [Right:Hyperpigmented] Hair Growth on Extremity: [Left:Yes] [Right:Yes] Temperature of Extremity: [Left:Warm] [Right:Warm] Capillary Refill: [Left:< 3 seconds] [Right:< 3 seconds] Toe Nail Assessment Left: Right: Thick: Yes Yes Discolored: Yes Yes Deformed: Yes Yes Improper Length and Hygiene: No No Electronic Signature(s) Signed: 10/11/2016 5:52:12 PM By: Jackquline Denmark ([id number]) Entered By: Carole Civil on 10/11/2016 09:41:59 Brooke Hopkins ([id number]) -------------------------------------------------------------------------------- [place] Details Patient Name: Brooke Hopkins, Brooke Hopkins 10/11/2016 8:45 Date of Service: AM Medical Record [id number] Number: Patient Account Number: [id number] 08-22-1951 (65 y.o. Treating RN:  Carole Civil Date of Birth/Sex: Female) Other Clinician: Primary Care Physician: FITZGERALD, DAVID Treating Londell Moh Referring Physician: FITZGERALD, DAVID Physician/Extender: Suella [place] in Treatment: 16 Active Inactive Orientation to the Wound Care Program Nursing Diagnoses: Knowledge deficit related to the wound healing center program Goals: Patient/caregiver will verbalize understanding of the [place] Date Initiated: 06/21/2016 Goal Status: Active Interventions: Provide education on orientation to the wound center Notes: Wound/Skin Impairment Nursing Diagnoses: Impaired tissue integrity Goals: Patient/caregiver will verbalize understanding of skin care regimen Date Initiated: 06/21/2016 Goal Status: Active Ulcer/skin breakdown will have a volume reduction of 30% by week 4 Date Initiated: 06/21/2016 Goal Status: Active Ulcer/skin breakdown will have a volume reduction of 50% by week 8 Date Initiated: 06/21/2016 Goal Status:  Active Ulcer/skin breakdown will have a volume reduction of 80% by week 12 Date Initiated: 06/21/2016 Goal Status: Active Ulcer/skin breakdown will heal within 14 weeks Date Initiated: 06/21/2016 EVANGELIA, WHITAKER ([id number]) Goal Status: Active Interventions: Assess patient/caregiver ability to obtain necessary supplies Assess patient/caregiver ability to perform ulcer/skin care regimen upon admission and as needed Assess ulceration(s) every visit Provide education on ulcer and skin care Notes: Electronic Signature(s) Signed: 10/11/2016 5:52:12 PM By: Carole Civil Entered By: Carole Civil on 10/11/2016 10:11:32 Brooke Hopkins ([id number]) -------------------------------------------------------------------------------- Pain Assessment Details Patient Name: Brooke Hopkins, Brooke Hopkins 10/11/2016 8:45 Date of Service: AM Medical Record [id number] Number: Patient Account Number: [id number] 22-Apr-1951 (65 y.o. Treating RN: Carole Civil Date of Birth/Sex: Female) Other Clinician: Primary Care Physician: FITZGERALD, DAVID Treating Londell Moh Referring Physician: FITZGERALD, DAVID Physician/Extender: Weeks in Treatment: 16 Active Problems Location of Pain Severity and Description of Pain Patient Has Paino Yes Site Locations Pain Location: Pain in Ulcers With Dressing Change: Yes Rate the pain. Current Pain Level: 6 Character of Pain Describe the Pain: Aching, Burning Pain Management and Medication Current Pain Management: Medication: No How does your pain impact your activities of daily livingo Sleep: No Appetite: No Electronic Signature(s) Signed: 10/11/2016 5:52:12 PM By: Carole Civil Entered By: Carole Civil on 10/11/2016 09:18:52 Brooke Hopkins ([id number]) -------------------------------------------------------------------------------- Wound Assessment Details Patient Name: Brooke Hopkins, Brooke Hopkins 10/11/2016 8:45 Date of Service: AM Medical  Record [id number] Number: Patient Account Number: [id number] 12-16-1951 (65 y.o. Treating RN: Carole Civil Date of Birth/Sex: Female) Other Clinician: Primary Care Physician: FITZGERALD, DAVID Treating Londell Moh Referring Physician: FITZGERALD, DAVID Physician/Extender: Weeks in Treatment: 16 Wound Status Wound Number: 1 Primary Etiology: Lymphedema Wound Location: Left Lower Leg - Wound Status: Open Circumfernential Wounding Event: Gradually Appeared Date Acquired: 12/31/2015 Weeks Of Treatment: 16 Clustered Wound: No Wound Measurements Length: (cm) 2.3 Width: (cm) 2 Depth: (cm) 0.2 Area: (cm) 3.613 Volume: (cm) 0.723 % Reduction in Area: 96.4% % Reduction in Volume: 96.4% Epithelialization: Large (67-100%) Tunneling: No Wound Description Classification: Partial Thickness Wound Margin: Flat and Intact Exudate Amount: Large Exudate Type: Serosanguineous Exudate Color: red, brown Foul Odor After Cleansing: Yes Due to Product Use: No Wound Bed Granulation Amount: Small (1-33%) Exposed Structure Granulation Quality: Red Fascia Exposed: No Necrotic Amount: Large (67-100%) Fat Layer Exposed: No Necrotic Quality: Adherent Slough Tendon Exposed: No Muscle Exposed: No Joint Exposed: No Bone Exposed: No Limited to Skin Breakdown Periwound Skin Texture Texture Color No Abnormalities Noted: No No Abnormalities Noted: No Callus: No Atrophie Blanche: No COBIE, LEIDNER ([id number]) Crepitus: No Cyanosis: No Excoriation: No Ecchymosis: No Fluctuance: No Erythema: No Friable: No Hemosiderin Staining: Yes Induration: No Mottled: No Localized Edema: Yes Pallor: No Rash: No Rubor: No Scarring: Yes Temperature /  Pain Moisture Temperature: No Abnormality No Abnormalities Noted: No Tenderness on Palpation: Yes Dry / Scaly: No Maceration: Yes Moist: Yes Wound Preparation Ulcer Cleansing: Other: soap and water, Topical Anesthetic Applied: Other:  lidocaine 4%, Treatment Notes Wound #1 (Left, Circumferential Lower Leg) 1. Cleansed with: Cleanse wound with antibacterial soap and water 2. Anesthetic Topical Lidocaine 4% cream to wound bed prior to debridement 4. Dressing Applied: Aquacel Ag Santyl Ointment 5. Secondary Dressing Applied ABD Pad Dry Gauze 7. Secured with Tape 3 Layer Compression System - Bilateral Notes unna to anchor Electronic Signature(s) Signed: 10/11/2016 5:52:12 PM By: Carole Civil Entered By: Carole Civil on 10/11/2016 09:46:29 Brooke Hopkins, Brooke Hopkins ([id number]) -------------------------------------------------------------------------------- Wound Assessment Details Patient Name: Brooke Hopkins, Brooke Hopkins 10/11/2016 8:45 Date of Service: AM Medical Record [id number] Number: Patient Account Number: [id number] 07/04/1951 (65 y.o. Treating RN: Carole Civil Date of Birth/Sex: Female) Other Clinician: Primary Care Physician: FITZGERALD, DAVID Treating Londell Moh Referring Physician: FITZGERALD, DAVID Physician/Extender: Weeks in Treatment: 16 Wound Status Wound Number: 2 Primary Etiology: Lymphedema Wound Location: Right Lower Leg - Wound Status: Open Circumfernential Wounding Event: Gradually Appeared Date Acquired: 12/31/2015 Weeks Of Treatment: 16 Clustered Wound: Yes Wound Measurements Length: (cm) 2.1 Width: (cm) 7.5 Depth: (cm) 0.6 Clustered Quantity: 3 Area: (cm) 12.37 Volume: (cm) 7.422 % Reduction in Area: 94.5% % Reduction in Volume: 83.6% Epithelialization: Large (67-100%) Tunneling: No Undermining: No Wound Description Classification: Partial Thickness Wound Margin: Indistinct, nonvisible Exudate Amount: Large Exudate Type: Serosanguineous Exudate Color: red, brown Foul Odor After Cleansing: No Wound Bed Granulation Amount: Small (1-33%) Exposed Structure Granulation Quality: Red Fascia Exposed: No Necrotic Amount: Large (67-100%) Fat Layer Exposed: No Necrotic  Quality: Adherent Slough Tendon Exposed: No Muscle Exposed: No Joint Exposed: No Bone Exposed: No Limited to Skin Breakdown Periwound Skin Texture Texture Color No Abnormalities Noted: No No Abnormalities Noted: No SHARETTA, RICCHIO ([id number]) Callus: No Atrophie Blanche: No Crepitus: No Cyanosis: No Excoriation: No Ecchymosis: No Fluctuance: No Erythema: No Friable: No Hemosiderin Staining: Yes Induration: No Mottled: No Localized Edema: Yes Pallor: No Rash: No Rubor: No Scarring: Yes Temperature / Pain Moisture Temperature: No Abnormality No Abnormalities Noted: No Tenderness on Palpation: Yes Dry / Scaly: No Maceration: Yes Moist: Yes Wound Preparation Ulcer Cleansing: Other: soap and water, Topical Anesthetic Applied: Other: lidocaine 4%, Treatment Notes Wound #2 (Right, Circumferential Lower Leg) 1. Cleansed with: Cleanse wound with antibacterial soap and water 2. Anesthetic Topical Lidocaine 4% cream to wound bed prior to debridement 4. Dressing Applied: Aquacel Ag Santyl Ointment 5. Secondary Dressing Applied ABD Pad Dry Gauze 7. Secured with Tape 3 Layer Compression System - Bilateral Notes unna to anchor Electronic Signature(s) Signed: 10/11/2016 5:52:12 PM By: Carole Civil Entered By: Carole Civil on 10/11/2016 09:49:29 REANN, DOBIAS ([id number]) -------------------------------------------------------------------------------- Vitals Details Patient Name: Brooke Hopkins, Brooke Hopkins 10/11/2016 8:45 Date of Service: AM Medical Record [id number] Number: Patient Account Number: [id number] [birth year] (65 y.o. Treating RN: Carole Civil Date of Birth/Sex: Female) Other Clinician: Primary Care Physician: FITZGERALD, DAVID Treating Londell Moh Referring Physician: FITZGERALD, DAVID Physician/Extender: Weeks in Treatment: 16 Vital Signs Time Taken: 09:19 Temperature (F): 97.7 Height (in): 67 Pulse (bpm): 69 Weight (lbs):  300 Respiratory Rate (breaths/min): 20 Body Mass Index (BMI): 47 Blood Pressure (mmHg): 219/68 Reference Range: 80 - 120 mg / dl Electronic Signature(s) Signed: 10/11/2016 5:52:12 PM By: Carole Civil Entered By: Carole Civil on 10/11/2016 09:19:45

## 2016-10-18 ENCOUNTER — Encounter: Payer: 59 | Admitting: Surgery

## 2016-10-18 DIAGNOSIS — L97212 Non-pressure chronic ulcer of right calf with fat layer exposed: Secondary | ICD-10-CM | POA: Diagnosis not present

## 2016-10-18 DIAGNOSIS — E11622 Type 2 diabetes mellitus with other skin ulcer: Secondary | ICD-10-CM | POA: Diagnosis not present

## 2016-10-18 DIAGNOSIS — L97222 Non-pressure chronic ulcer of left calf with fat layer exposed: Secondary | ICD-10-CM | POA: Diagnosis not present

## 2016-10-18 DIAGNOSIS — I890 Lymphedema, not elsewhere classified: Secondary | ICD-10-CM | POA: Diagnosis not present

## 2016-10-19 NOTE — Progress Notes (Signed)
Brooke, Hopkins ([id number]) Visit Report for 10/18/2016 Chief Complaint Document Details Patient Name: Brooke Hopkins, Brooke Hopkins 10/18/2016 8:45 Date of Service: AM Medical Record [id number] Number: Patient Account Number: [id number] 02-14-1951 (65 y.o. Treating RN: Ahmed Prima Date of Birth/Sex: Female) Other Clinician: Primary Care Physician: FITZGERALD, DAVID Treating Christin Fudge Referring Physician: FITZGERALD, DAVID Physician/Extender: Suella Grove in Treatment: 17 Information Obtained from: Patient Chief Complaint Patients presents for treatment of an open diabetic ulcer and significant lymphedema which she's had for about two years Electronic Signature(s) Signed: 10/18/2016 9:51:47 AM By: Christin Fudge MD, FACS Entered By: Christin Fudge on 10/18/2016 09:51:47 [name] ([id number]) -------------------------------------------------------------------------------- HPI Details Patient Name: Brooke, Hopkins 10/18/2016 8:45 Date of Service: AM Medical Record [id number] Number: Patient Account Number: [id number] 06-13-1951 (65 y.o. Treating RN: Ahmed Prima Date of Birth/Sex: Female) Other Clinician: Primary Care Physician: FITZGERALD, DAVID Treating Christin Fudge Referring Physician: FITZGERALD, DAVID Physician/Extender: Weeks in Treatment: 17 History of Present Illness Location: massive swelling of both lower extremities and ulceration both lower extremities Quality: Patient reports experiencing a dull pain to affected area(s). Severity: Patient states wound are getting worse. Duration: Patient has had the wound for >2 years prior to seeking treatment at the wound center Timing: Pain in wound is constant (hurts all the time) Context: The wound appeared gradually over time Modifying Factors: Other treatment(s) tried include:she has a lymphedema pump but uses it seldom and she's had several course of antibiotics Associated Signs and Symptoms: Patient reports  having difficulty standing for long periods. HPI Description: 65 year old patient seen by Dr. Ola Spurr of infectious disease who has been following up for left lower extremity cellulitis and ulcer with recurrent bilateral lower extremity problems for several months. Recently she had a large right lower extremity bullae which opened out and has been ulcerated. She has seen the vascular group and has been getting Unna's wraps and has a lymphedema pump used in the past. Her prior cultures were positive for Pseudomonas, Proteus and was treated with amoxicillin. He has also been treated with 2 weeks course of ciprofloxacin and amoxicillin.. Increase of Lasix dose helped with the edema and echo showed no systolic CHF but may be diastolic problems. Past medical history significant for diabetes mellitus type 2, venous stasis ulcer, obesity, diabetic peripheral neuropathy, status post back surgery, cholecystectomy, hysterectomy, arthroscopy of the knee. He is a former smoker and quit smoking in 1984. The patient has seen Dr. Delana Meyer who did not recommend any arterial or venous duplex studies and has been using Unna's wraps and also recommended a lymphedema pump. she has been very noncompliant with using these. 06/28/2016 -- the patient is still on antibiotics as prescribed by Dr. Ola Spurr and he is asked her to take it for 3 weeks. The patient also says she has a lot of redness and pain in the folds of her thigh and lower extremity and this is very painful. 07/26/2016 -- the patient is off antibiotics and has been getting dressing changes 3 times a week. 08/09/2016 -- is been on Cipro and is taking potassium supplements along with her Lasix and is going to be seeing Dr. Ola Spurr for a consult return visit only in November 2017. 08/23/2016 -- she was admitted to the hospital last week and I have reviewed these reports in detail. She was seen by Dr. Ola Spurr who noted a Pseudomonas infection which  was resistant to ciprofloxacin and discharged her on Ceftazidime 1 g IV every 12 hourly anyone days. The antibiotic was to be stopped  on September 11 and he would see her back in the clinic. Brooke, Hopkins ([id number]) 08/30/2016 -- had a communication from Dr. Ola Spurr that he would extend her antibiotics by a week if she continued to look like cellulitis was persisting. 09/13/2016 -- the PICC line is out and antibiotics have stopped. 10/04/16: returns today for f/u. reports utilizes compression pump twice daily. she is compliant with her compression therapy. she reports a new blister on the right proximal posterior calf. no systemic s/s of infection. 10/11/16: returns today for f/u. she reports adherence to her compression pumps, but there is no improvement regarding her BLE edema. there is weeping of fluid. denies fever, chills, body aches or malaise. 10/18/2016 -- she has a significant cellulitis of her right lower extremity which was not there last week. I believe at this stage she will need the expertise of Dr. Ola Spurr to decide on antibiotic coverage. Electronic Signature(s) Signed: 10/18/2016 9:56:11 AM By: Christin Fudge MD, FACS Entered By: Christin Fudge on 10/18/2016 09:56:10 PATRIZIA, PAULE ([id number]) -------------------------------------------------------------------------------- Physical Exam Details Patient Name: Brooke, Hopkins 10/18/2016 8:45 Date of Service: AM Medical Record [id number] Number: Patient Account Number: [id number] 1951-06-10 (65 y.o. Treating RN: Ahmed Prima Date of Birth/Sex: Female) Other Clinician: Primary Care Physician: FITZGERALD, DAVID Treating Christin Fudge Referring Physician: FITZGERALD, DAVID Physician/Extender: Weeks in Treatment: 17 Constitutional . Pulse regular. Respirations normal and unlabored. Afebrile. . Eyes Nonicteric. Reactive to light. Ears, Nose, Mouth, and Throat Lips, teeth, and gums WNL.Marland Kitchen Moist mucosa  without lesions. Neck supple and nontender. No palpable supraclavicular or cervical adenopathy. Normal sized without goiter. Respiratory WNL. No retractions.. Breath sounds WNL, No rubs, rales, rhonchi, or wheeze.. Cardiovascular Heart rhythm and rate regular, no murmur or gallop.. Pedal Pulses WNL. No clubbing, cyanosis or edema. Lymphatic No adneopathy. No adenopathy. No adenopathy. Musculoskeletal Adexa without tenderness or enlargement.. Digits and nails w/o clubbing, cyanosis, infection, petechiae, ischemia, or inflammatory conditions.. Integumentary (Hair, Skin) No suspicious lesions. No crepitus or fluctuance. No peri-wound warmth or erythema. No masses.Marland Kitchen Psychiatric Judgement and insight Intact.. No evidence of depression, anxiety, or agitation.. Notes the lymphedema persist and there is a significant amount of it right more than left. I also think that is a significant superficial cellulitis of the right leg and there is a larger excoriation posteriorly. Electronic Signature(s) Signed: 10/18/2016 9:56:41 AM By: Christin Fudge MD, FACS Entered By: Christin Fudge on 10/18/2016 09:56:41 JOUD, PETTINATO ([id number]) -------------------------------------------------------------------------------- Physician Orders Details Patient Name: CIERRA, ROTHGEB 10/18/2016 8:45 Date of Service: AM Medical Record [id number] Number: Patient Account Number: [id number] 05/06/1951 (65 y.o. Treating RN: Montey Hora Date of Birth/Sex: Female) Other Clinician: Primary Care Physician: FITZGERALD, DAVID Treating Christin Fudge Referring Physician: FITZGERALD, DAVID Physician/Extender: Suella Grove in Treatment: 17 Verbal / Phone Orders: Yes Clinician: Montey Hora Read Back and Verified: Yes Diagnosis Coding Wound Cleansing Wound #1 Left,Circumferential Lower Leg o Cleanse wound with mild soap and water o May Shower, gently pat wound dry prior to applying new dressing. o May shower  with protection. Wound #2 Right,Circumferential Lower Leg o Cleanse wound with mild soap and water o May Shower, gently pat wound dry prior to applying new dressing. o May shower with protection. Anesthetic Wound #1 Left,Circumferential Lower Leg o Topical Lidocaine 4% cream applied to wound bed prior to debridement Wound #2 Right,Circumferential Lower Leg o Topical Lidocaine 4% cream applied to wound bed prior to debridement Skin Barriers/Peri-Wound Care Wound #1 Left,Circumferential Lower Leg o Other: - Lotrisone cream in folds at  knees and on reddened areas on legs (Nystatin used in clinic) Wound #2 Right,Circumferential Lower Leg o Other: - Lotrisone cream in folds at knees and on reddened areas on legs (Nystatin used in clinic) Primary Wound Dressing Wound #1 Left,Circumferential Lower Leg o Aquacel Ag - aquacel ag on all other open areas Wound #2 Right,Circumferential Lower Leg o Aquacel Ag - aquacel ag on all other open areas Secondary Dressing Wound #1 Left,Circumferential Lower Leg ICELA, GLYMPH ([id number]) o ABD pad o XtraSorb Wound #2 Right,Circumferential Lower Leg o ABD pad o XtraSorb Dressing Change Frequency Wound #1 Left,Circumferential Lower Leg o Change Dressing Monday, Wednesday, Friday - HHRN to change wraps Monday and Wednesay Wound #2 Right,Circumferential Lower Leg o Change Dressing Monday, Wednesday, Friday - HHRN to change wraps Monday and Wednesay Follow-up Appointments Wound #1 Left,Circumferential Lower Leg o Return Appointment in 1 week. Wound #2 Right,Circumferential Lower Leg o Return Appointment in 1 week. Edema Control Wound #1 Left,Circumferential Lower Leg o 3 Layer Compression System - Bilateral - May anchor top of wrap with dome paste, but only wrap around leg one time. o Elevate legs to the level of the heart and pump ankles as often as possible Wound #2 Right,Circumferential Lower Leg o 3  Layer Compression System - Bilateral - May anchor top of wrap with dome paste, but only wrap around leg one time. o Elevate legs to the level of the heart and pump ankles as often as possible Home Health Wound #1 Left,Circumferential Lower Leg o [place] Visits - [place] Nurse may visit PRN to address patientos wound care needs. o FACE TO FACE ENCOUNTER: MEDICARE and MEDICAID PATIENTS: I certify that this patient is under my care and that I had a face-to-face encounter that meets the physician face-to-face encounter requirements with this patient on this date. The encounter with the patient was in whole or in part for the following MEDICAL CONDITION: (primary reason for [place]) MEDICAL NECESSITY: I certify, that based on my findings, NURSING services are a medically necessary home health service. HOME BOUND STATUS: I certify that my clinical findings support that this patient is homebound (i.e., Due to illness or injury, pt requires aid of supportive devices such as crutches, cane, wheelchairs, walkers, the use of special transportation or the assistance of another person to leave their place of residence. There is a normal inability to leave the home and doing so requires considerable and taxing effort. Other absences are for medical reasons / religious services and are infrequent or of short duration when for other reasons). LARISHA, VENCILL ([id number]) o Please direct any NON-WOUND related issues/requests for orders to patient's Primary Care Physician Wound #2 Right,Circumferential Lower Leg o [place] Visits - [place] Nurse may visit PRN to address patientos wound care needs. o FACE TO FACE ENCOUNTER: MEDICARE and MEDICAID PATIENTS: I certify that this patient is under my care and that I had a face-to-face encounter that meets the physician face-to-face encounter requirements with this patient on this  date. The encounter with the patient was in whole or in part for the following MEDICAL CONDITION: (primary reason for [place]) MEDICAL NECESSITY: I certify, that based on my findings, NURSING services are a medically necessary home health service. HOME BOUND STATUS: I certify that my clinical findings support that this patient is homebound (i.e., Due to illness or injury, pt requires aid of supportive devices such as crutches, cane, wheelchairs, walkers, the use of  special transportation or the assistance of another person to leave their place of residence. There is a normal inability to leave the home and doing so requires considerable and taxing effort. Other absences are for medical reasons / religious services and are infrequent or of short duration when for other reasons). o Please direct any NON-WOUND related issues/requests for orders to patient's Primary Care Physician Electronic Signature(s) Signed: 10/18/2016 4:40:13 PM By: Christin Fudge MD, FACS Signed: 10/18/2016 4:59:11 PM By: Montey Hora Entered By: Montey Hora on 10/18/2016 09:42:37 [name] ([id number]) -------------------------------------------------------------------------------- Problem List Details Patient Name: KORALYNN, GREENSPAN 10/18/2016 8:45 Date of Service: AM Medical Record [id number] Number: Patient Account Number: [id number] Sep 10, 1951 (65 y.o. 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 10/18/2016 8:45 Date of Service: AM Medical Record [id number] Number: Patient Account Number: [id number] [birth year] (65 y.o. Treating RN: Ahmed Prima Date of Birth/Sex: Female) Other Clinician: Primary Care Physician: FITZGERALD, DAVID Treating Christin Fudge Referring Physician: FITZGERALD, DAVID Physician/Extender: Weeks in Treatment: 17 Subjective Chief Complaint Information obtained from Patient Patients presents for treatment of an open diabetic ulcer and significant lymphedema which she's had for about two years History of Present Illness (HPI) The following HPI elements were documented for the patient's wound: Location: massive swelling of both lower extremities and ulceration both lower extremities Quality: Patient reports experiencing a dull pain to affected area(s). Severity: Patient states wound are getting worse. Duration: Patient has had the wound for >2 years prior to seeking treatment at the wound center Timing: Pain in wound is constant (hurts all the time) Context: The wound appeared gradually over time Modifying Factors: Other treatment(s) tried include:she has a lymphedema pump but uses it seldom and she's had several course of antibiotics Associated Signs and Symptoms: Patient reports having difficulty standing for long periods. 65 year old patient seen by Dr. Ola Spurr of infectious disease who has been following up for left lower extremity cellulitis  and ulcer with recurrent bilateral lower extremity problems for several months. Recently she had a large right lower extremity bullae which opened out and has been ulcerated. She has seen the vascular group and has been getting Unna's wraps and has a lymphedema pump used in the past. Her prior cultures were positive for Pseudomonas, Proteus and was treated with amoxicillin. He has also been treated with 2 weeks course of ciprofloxacin and amoxicillin.. Increase of Lasix dose helped with the edema and echo showed no systolic CHF but may be diastolic problems. Past medical history significant for diabetes mellitus type 2, venous stasis ulcer, obesity, diabetic peripheral neuropathy, status post back surgery, cholecystectomy, hysterectomy, arthroscopy of the knee. He is a former smoker and quit smoking in 1984. The patient has seen Dr. Delana Meyer who did not recommend any arterial or venous duplex studies  and has been using Unna's wraps and also recommended a lymphedema pump. she has been very noncompliant with using these. 06/28/2016 -- the patient is still on antibiotics as prescribed by Dr. Ola Spurr and he is asked her to take it for 3 weeks. The patient also says she has a lot of redness and pain in the folds of her thigh and lower extremity and this is very painful. ARIELIS, LEONHART ([id number]) 07/26/2016 -- the patient is off antibiotics and has been getting dressing changes 3 times a week. 08/09/2016 -- is been on Cipro and is taking potassium supplements along with her Lasix and is going to be seeing Dr. Ola Spurr for a consult return visit only in November 2017. 08/23/2016 -- she was admitted to the hospital last week and I have reviewed these reports in detail. She was seen by Dr. Ola Spurr who noted a Pseudomonas infection which was resistant to ciprofloxacin and discharged her on Ceftazidime 1 g IV every 12 hourly anyone days. The antibiotic was to be stopped on September 11 and he  would see her back in the clinic. 08/30/2016 -- had a communication from Dr. Ola Spurr that he would extend her antibiotics by a week if she continued to look like cellulitis was persisting. 09/13/2016 -- the PICC line is out and antibiotics have stopped. 10/04/16: returns today for f/u. reports utilizes compression pump twice daily. she is compliant with her compression therapy. she reports a new blister on the right proximal posterior calf. no systemic s/s of infection. 10/11/16: returns today for f/u. she reports adherence to her compression pumps, but there is no improvement regarding her BLE edema. there is weeping of fluid. denies fever, chills, body aches or malaise. 10/18/2016 -- she has a significant cellulitis of her right lower extremity which was not there last week. I believe at this stage she will need the expertise of Dr. Ola Spurr to decide on antibiotic coverage. Objective Constitutional Pulse regular. Respirations normal and unlabored. Afebrile. Vitals Time Taken: 9:01 AM, Height: 67 in, Weight: 300 lbs, BMI: 47, Temperature: 98.0 F, Pulse: 68 bpm, Respiratory Rate: 20 breaths/min, Blood Pressure: 186/72 mmHg. General Notes: Made Dr. Con Memos aware of BP. Eyes Nonicteric. Reactive to light. Ears, Nose, Mouth, and Throat Lips, teeth, and gums WNL.Marland Kitchen Moist mucosa without lesions. Neck supple and nontender. No palpable supraclavicular or cervical adenopathy. Normal sized without goiter. Respiratory WNL. No retractions.. Breath sounds WNL, No rubs, rales, rhonchi, or wheeze.. Cardiovascular CHARNA, NEEB. ([id number]) Heart rhythm and rate regular, no murmur or gallop.. Pedal Pulses WNL. No clubbing, cyanosis or edema. Lymphatic No adneopathy. No adenopathy. No adenopathy. Musculoskeletal Adexa without tenderness or enlargement.. Digits and nails w/o clubbing, cyanosis, infection, petechiae, ischemia, or inflammatory conditions.Marland Kitchen Psychiatric Judgement and insight  Intact.. No evidence of depression, anxiety, or agitation.. General Notes: the lymphedema persist and there is a significant amount of it right more than left. I also think that is a significant superficial cellulitis of the right leg and there is a larger excoriation posteriorly. Integumentary (Hair, Skin) No suspicious lesions. No crepitus or fluctuance. No peri-wound warmth or erythema. No masses.. Wound #1 status is Open. Original cause of wound was Gradually Appeared. The wound is located on the Left,Circumferential Lower Leg. The wound measures 4.7cm length x 5cm width x 0.2cm depth; 18.457cm^2 area and 3.691cm^3 volume. The wound is limited to skin breakdown. There is no tunneling or undermining noted. There is a large amount of serosanguineous drainage noted. The wound margin is flat and intact.  There is large (67-100%) red granulation within the wound bed. There is a small (1-33%) amount of necrotic tissue within the wound bed including Adherent Slough. The periwound skin appearance exhibited: Localized Edema, Scarring, Maceration, Moist, Hemosiderin Staining. The periwound skin appearance did not exhibit: Callus, Crepitus, Excoriation, Fluctuance, Friable, Induration, Rash, Dry/Scaly, Atrophie Blanche, Cyanosis, Ecchymosis, Mottled, Pallor, Rubor, Erythema. Periwound temperature was noted as No Abnormality. The periwound has tenderness on palpation. Wound #2 status is Open. Original cause of wound was Gradually Appeared. The wound is located on the Right,Circumferential Lower Leg. The wound measures 25cm length x 28cm width x 0.2cm depth; 549.779cm^2 area and 109.956cm^3 volume. The wound is limited to skin breakdown. There is no tunneling or undermining noted. There is a large amount of serosanguineous drainage noted. The wound margin is indistinct and nonvisible. There is small (1-33%) red granulation within the wound bed. There is a large (67-100%) amount of necrotic tissue within  the wound bed including Eschar and Adherent Slough. The periwound skin appearance exhibited: Localized Edema, Scarring, Maceration, Moist, Hemosiderin Staining, Erythema. The periwound skin appearance did not exhibit: Callus, Crepitus, Excoriation, Fluctuance, Friable, Induration, Rash, Dry/Scaly, Atrophie Blanche, Cyanosis, Ecchymosis, Mottled, Pallor, Rubor. The surrounding wound skin color is noted with erythema which is circumferential. Periwound temperature was noted as No Abnormality. The periwound has tenderness on palpation. 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 May shower with protection. Wound #2 Right,Circumferential Lower Leg: Cleanse wound with mild soap and water May Shower, gently pat wound dry prior to applying new dressing. May shower with protection. Anesthetic: Wound #1 Left,Circumferential Lower Leg: Topical Lidocaine 4% cream applied to wound bed prior to debridement Wound #2 Right,Circumferential Lower Leg: Topical Lidocaine 4% cream applied to wound bed prior to debridement Skin Barriers/Peri-Wound Care: Wound #1 Left,Circumferential Lower Leg: Other: - Lotrisone cream in folds at knees and on reddened areas on legs (Nystatin used in clinic) Wound #2 Right,Circumferential Lower Leg: Other: - Lotrisone cream in folds at knees and on reddened areas on legs (Nystatin used in clinic) Primary Wound Dressing: Wound #1  Left,Circumferential Lower Leg: Aquacel Ag - aquacel ag on all other open areas Wound #2 Right,Circumferential Lower Leg: Aquacel Ag - aquacel ag on all other open areas Secondary Dressing: Wound #1 Left,Circumferential Lower Leg: ABD pad XtraSorb Wound #2 Right,Circumferential Lower Leg: ABD pad XtraSorb Dressing Change Frequency: Wound #1 Left,Circumferential Lower Leg: Change Dressing Monday, Wednesday, Friday - HHRN to change wraps Monday and Wednesay Wound #2 Right,Circumferential Lower Leg: Change Dressing Monday, Wednesday, Friday - HHRN to change wraps Monday and Wednesay MIAKODA, MCMILLION. ([id number]) Follow-up Appointments: Wound #1 Left,Circumferential Lower Leg: Return Appointment in 1 week. Wound #2 Right,Circumferential Lower Leg: Return Appointment in 1 week. Edema Control: Wound #1 Left,Circumferential Lower Leg: 3 Layer Compression System - Bilateral - May anchor top of wrap with dome paste, but only wrap around leg one time. Elevate legs to the level of the heart and pump ankles as often as possible Wound #2 Right,Circumferential Lower Leg: 3  Layer Compression System - Bilateral - May anchor top of wrap with dome paste, but only wrap around leg one time. Elevate legs to the level of the heart and pump ankles as often as possible Home Health: Wound #1 Left,Circumferential Lower Leg: Continue Home Health Visits - [place] Nurse may visit PRN to address patient s wound care needs. FACE TO FACE ENCOUNTER: MEDICARE and MEDICAID PATIENTS: I certify that this patient is under my care and that I had a face-to-face encounter that meets the physician face-to-face encounter requirements with this patient on this date. The encounter with the patient was in whole or in part for the following MEDICAL CONDITION: (primary reason for [place]) MEDICAL NECESSITY: I certify, that based on my findings, NURSING services are a medically necessary home health  service. HOME BOUND STATUS: I certify that my clinical findings support that this patient is homebound (i.e., Due to illness or injury, pt requires aid of supportive devices such as crutches, cane, wheelchairs, walkers, the use of special transportation or the assistance of another person to leave their place of residence. There is a normal inability to leave the home and doing so requires considerable and taxing effort. Other absences are for medical reasons / religious services and are infrequent or of short duration when for other reasons). Please direct any NON-WOUND related issues/requests for orders to patient's Primary Care Physician Wound #2 Right,Circumferential Lower Leg: [place] Visits - [place] Nurse may visit PRN to address patient s wound care needs. FACE TO FACE ENCOUNTER: MEDICARE and MEDICAID PATIENTS: I certify that this patient is under my care and that I had a face-to-face encounter that meets the physician face-to-face encounter requirements with this patient on this date. The encounter with the patient was in whole or in part for the following MEDICAL CONDITION: (primary reason for [place]) MEDICAL NECESSITY: I certify, that based on my findings, NURSING services are a medically necessary home health service. HOME BOUND STATUS: I certify that my clinical findings support that this patient is homebound (i.e., Due to illness or injury, pt requires aid of supportive devices such as crutches, cane, wheelchairs, walkers, the use of special transportation or the assistance of another person to leave their place of residence. There is a normal inability to leave the home and doing so requires considerable and taxing effort. Other absences are for medical reasons / religious services and are infrequent or of short duration when for other reasons). Please direct any NON-WOUND related issues/requests for orders to patient's Primary Care  Physician RYLYNNE, SCHICKER. ([id number]) The right leg has deteriorated significantly and at this stage I have sent a message to Dr. Ola Spurr to expect a call from the patient regarding treatment. I have recommended silver alginate, draw text and a 3 layer Profore wrap and given her advice to proceed to the ER if her situation worsens over the weekend. Electronic Signature(s) Signed: 10/18/2016 9:57:42 AM By: Christin Fudge MD, FACS Entered By: Christin Fudge on 10/18/2016 09:57:42 [name] ([id number]) -------------------------------------------------------------------------------- SuperBill Details Patient Name: GRISSEL, TYRELL. Date of Service: 10/18/2016 Medical Record Number: [id number] Patient Account Number: [id number] Date of Birth/Sex: [birth year] (65 y.o. 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

## 2016-10-19 NOTE — Progress Notes (Signed)
Brooke Hopkins, Brooke Hopkins ([id number]) Visit Report for 10/18/2016 Arrival Information Details Patient Name: Brooke Hopkins, Brooke Hopkins. Date of Service: 10/18/2016 8:45 AM Medical Record Number: [id number] Patient Account Number: [id number] Date of Birth/Sex: Mar 14, 1951 (65 y.o. Female) Treating RN: Ahmed Prima Primary Care Physician: FITZGERALD, DAVID Other Clinician: Referring Physician: FITZGERALD, DAVID Treating Physician/Extender: Frann Rider in Treatment: 78 Visit Information History Since Last Visit All ordered tests and consults were completed: No Patient Arrived: Wheel Chair Added or deleted any medications: No Arrival Time: 08:59 Any new allergies or adverse reactions: No Accompanied By: son Had a fall or experienced change in No Transfer Assistance: [employer] activities of daily living that may affect Patient Lift risk of falls: Patient Identification Verified: Yes Signs or symptoms of abuse/neglect since last No Secondary Verification Process Yes visito Completed: Hospitalized since last visit: No Patient Requires Transmission- No Pain Present Now: Yes Based Precautions: Patient Has Alerts: No Electronic Signature(s) Signed: 10/18/2016 4:59:27 PM By: Alric Quan Entered By: Alric Quan on 10/18/2016 09:00:47 [name] ([id number]) -------------------------------------------------------------------------------- Encounter Discharge Information Details Patient Name: Brooke Hopkins, Brooke Hopkins. Date of Service: 10/18/2016 8:45 AM Medical Record Number: [id number] Patient Account Number: [id number] Date of Birth/Sex: 27-Jul-1951 (65 y.o. Female) Treating RN: Ahmed Prima Primary Care Physician: FITZGERALD, DAVID Other Clinician: Referring Physician: FITZGERALD, DAVID Treating Physician/Extender: Frann Rider in Treatment: 30 Encounter Discharge Information Items Discharge Pain Level: 0 Discharge Condition: Stable Ambulatory Status:  Wheelchair Discharge Destination: Home Transportation: Private Auto Accompanied By: son Schedule Follow-up Appointment: Yes Medication Reconciliation completed and provided to Patient/Care Yes Krystena Reitter: Provided on Clinical Summary of Care: 10/18/2016 Form Type Recipient Paper Patient LB Electronic Signature(s) Signed: 10/18/2016 10:02:57 AM By: Ruthine Dose Entered By: Ruthine Dose on 10/18/2016 10:02:57 [name] ([id number]) -------------------------------------------------------------------------------- Lower Extremity Assessment Details Patient Name: Brooke Hopkins, Brooke Hopkins. Date of Service: 10/18/2016 8:45 AM Medical Record Number: [id number] Patient Account Number: [id number] Date of Birth/Sex: July 30, 1951 (65 y.o. Female) Treating RN: Ahmed Prima Primary Care Physician: FITZGERALD, DAVID Other Clinician: Referring Physician: FITZGERALD, DAVID Treating Physician/Extender: Frann Rider in Treatment: 17 Edema Assessment Assessed: [Left: No] [Right: No] Edema: [Left: Yes] [Right: Yes] Calf Left: Right: Point of Measurement: 32 cm From Medial Instep 57.5 cm 48.6 cm Ankle Left: Right: Point of Measurement: 11 cm From Medial Instep 34.2 cm 32.3 cm Vascular Assessment Pulses: Posterior Tibial Dorsalis Pedis Palpable: [Left:Yes] [Right:Yes] Extremity colors, hair growth, and conditions: Extremity Color: [Left:Red] [Right:Red] Temperature of Extremity: [Left:Warm] [Right:Warm] Capillary Refill: [Left:< 3 seconds] [Right:< 3 seconds] Toe Nail Assessment Left: Right: Thick: Yes Yes Discolored: Yes Yes Deformed: No No Improper Length and Hygiene: Yes Yes Electronic Signature(s) Signed: 10/18/2016 4:59:27 PM By: Alric Quan Entered By: Alric Quan on 10/18/2016 09:22:18 [name] ([id number]) -------------------------------------------------------------------------------- Multi Wound Chart Details Patient Name: Brooke Hopkins, Brooke Hopkins. Date of Service: 10/18/2016 8:45 AM Medical Record Number: [id number] Patient Account Number: [id number] Date of Birth/Sex: November 13, 1951 (65 y.o. Female) Treating RN: Montey Hora Primary Care Physician: FITZGERALD, DAVID Other Clinician: Referring Physician: FITZGERALD, DAVID Treating Physician/Extender: Frann Rider in Treatment: 17 Vital Signs Height(in): 67 Pulse(bpm): 68 Weight(lbs): 300 Blood Pressure 186/72 (mmHg): Body Mass Index(BMI): 47 Temperature(F): 98.0 Respiratory Rate 20 (breaths/min): Photos: [1:No Photos] [2:No Photos] [N/A:N/A] Wound Location: [1:Left Lower Leg - Circumfernential] [2:Right Lower Leg - Circumfernential] [N/A:N/A] Wounding Event: [1:Gradually Appeared] [2:Gradually Appeared] [N/A:N/A] Primary Etiology: [1:Lymphedema] [2:Lymphedema] [N/A:N/A] Date Acquired: [1:12/31/2015] [2:12/31/2015] [N/A:N/A] Weeks of Treatment: [1:17] [2:17] [N/A:N/A] Wound Status: [1:Open] [2:Open] [N/A:N/A] Clustered Wound: [1:No] [2:Yes] [N/A:N/A] Clustered  Quantity: [1:N/A] [2:3] [N/A:N/A] Measurements L x W x D 4.7x5x0.2 [2:25x28x0.2] [N/A:N/A] (cm) Area (cm) : [1:18.457] [2:549.779] [N/A:N/A] Volume (cm) : [1:3.691] [2:109.956] [N/A:N/A] % Reduction in Area: [1:81.60%] [2:-143.10%] [N/A:N/A] % Reduction in Volume: 81.60% [2:-143.10%] [N/A:N/A] Classification: [1:Partial Thickness] [2:Partial Thickness] [N/A:N/A] Exudate Amount: [1:Large] [2:Large] [N/A:N/A] Exudate Type: [1:Serosanguineous] [2:Serosanguineous] [N/A:N/A] Exudate Color: [1:red, brown] [2:red, brown] [N/A:N/A] Foul Odor After [1:Yes] [2:No] [N/A:N/A] Cleansing: Odor Anticipated Due to No [2:N/A] [N/A:N/A] Product Use: Wound Margin: [1:Flat and Intact] [2:Indistinct, nonvisible] [N/A:N/A] Granulation Amount: [1:Large (67-100%)] [2:Small (1-33%)] [N/A:N/A] Granulation Quality: [1:Red] [2:Red] [N/A:N/A] Necrotic Amount: [1:Small (1-33%)] [2:Large (67-100%)] [N/A:N/A] Necrotic Tissue:  [1:Adherent Slough] [2:Eschar, Adherent Slough] [N/A:N/A] Exposed Structures: Fascia: No Fascia: No N/A Fat: No Fat: No Tendon: No Tendon: No Muscle: No Muscle: No Joint: No Joint: No Bone: No Bone: No Limited to Skin Limited to Skin Breakdown Breakdown Epithelialization: Large (67-100%) None N/A Periwound Skin Texture: Edema: Yes Edema: Yes N/A Scarring: Yes Scarring: Yes Excoriation: No Excoriation: No Induration: No Induration: No Callus: No Callus: No Crepitus: No Crepitus: No Fluctuance: No Fluctuance: No Friable: No Friable: No Rash: No Rash: No Periwound Skin Maceration: Yes Maceration: Yes N/A Moisture: Moist: Yes Moist: Yes Dry/Scaly: No Dry/Scaly: No Periwound Skin Color: Hemosiderin Staining: Yes Erythema: Yes N/A Atrophie Blanche: No Hemosiderin Staining: Yes Cyanosis: No Atrophie Blanche: No Ecchymosis: No Cyanosis: No Erythema: No Ecchymosis: No Mottled: No Mottled: No Pallor: No Pallor: No Rubor: No Rubor: No Erythema Location: N/A Circumferential N/A Temperature: No Abnormality No Abnormality N/A Tenderness on Yes Yes N/A Palpation: Wound Preparation: Ulcer Cleansing: Other: Ulcer Cleansing: Other: N/A soap and water soap and water Topical Anesthetic Topical Anesthetic Applied: Other: lidocaine Applied: Other: lidocaine 4% 4% Treatment Notes Electronic Signature(s) Signed: 10/18/2016 4:59:11 PM By: Montey Hora Entered By: Montey Hora on 10/18/2016 09:39:16 [name] ([id number]) -------------------------------------------------------------------------------- Multi-Disciplinary Care Plan Details Patient Name: Brooke Hopkins, Brooke Hopkins. Date of Service: 10/18/2016 8:45 AM Medical Record Number: [id number] Patient Account Number: [id number] Date of Birth/Sex: 14-Feb-1951 (65 y.o. Female) Treating RN: Montey Hora Primary Care Physician: FITZGERALD, DAVID Other Clinician: Referring Physician: FITZGERALD,  DAVID Treating Physician/Extender: Frann Rider in Treatment: 101 Active Inactive Orientation to the Wound Care Program Nursing Diagnoses: Knowledge deficit related to the wound healing center program Goals: Patient/caregiver will verbalize understanding of the [place] Program Date Initiated: 06/21/2016 Goal Status: Active Interventions: Provide education on orientation to the wound center Notes: Wound/Skin Impairment Nursing Diagnoses: Impaired tissue integrity Goals: Patient/caregiver will verbalize understanding of skin care regimen Date Initiated: 06/21/2016 Goal Status: Active Ulcer/skin breakdown will have a volume reduction of 30% by week 4 Date Initiated: 06/21/2016 Goal Status: Active Ulcer/skin breakdown will have a volume reduction of 50% by week 8 Date Initiated: 06/21/2016 Goal Status: Active Ulcer/skin breakdown will have a volume reduction of 80% by week 12 Date Initiated: 06/21/2016 Goal Status: Active Ulcer/skin breakdown will heal within 14 weeks Date Initiated: 06/21/2016 Goal Status: Active Brooke Hopkins, Brooke Hopkins ([id number]) Interventions: Assess patient/caregiver ability to obtain necessary supplies Assess patient/caregiver ability to perform ulcer/skin care regimen upon admission and as needed Assess ulceration(s) every visit Provide education on ulcer and skin care Notes: Electronic Signature(s) Signed: 10/18/2016 4:59:11 PM By: Montey Hora Entered By: Montey Hora on 10/18/2016 09:39:07 [name] ([id number]) -------------------------------------------------------------------------------- Pain Assessment Details Patient Name: Brooke Hopkins, Brooke Hopkins. Date of Service: 10/18/2016 8:45 AM Medical Record Number: [id number] Patient Account Number: [id number] Date of Birth/Sex: [birth year] (65 y.o. Female) Treating RN: Ahmed Prima Primary Care  Physician: FITZGERALD, DAVID Other Clinician: Referring Physician: FITZGERALD,  DAVID Treating Physician/Extender: Frann Rider in Treatment: 17 Active Problems Location of Pain Severity and Description of Pain Patient Has Paino Yes Site Locations Pain Location: Pain in Ulcers With Dressing Change: Yes Rate the pain. Current Pain Level: 3 Character of Pain Describe the Pain: Aching, Burning, Throbbing Pain Management and Medication Current Pain Management: Electronic Signature(s) Signed: 10/18/2016 4:59:27 PM By: Alric Quan Entered By: Alric Quan on 10/18/2016 09:01:13 [name] ([id number]) -------------------------------------------------------------------------------- Patient/Caregiver Education Details Patient Name: Brooke Hopkins, Brooke Hopkins. Date of Service: 10/18/2016 8:45 AM Medical Record Number: [id number] Patient Account Number: [id number] Date of Birth/Gender: 1951/07/16 (65 y.o. Female) Treating RN: Ahmed Prima Primary Care Physician: FITZGERALD, DAVID Other Clinician: Referring Physician: FITZGERALD, DAVID Treating Physician/Extender: Frann Rider in Treatment: 17 Education Assessment Education Provided To: Patient Education Topics Provided Wound/Skin Impairment: Handouts: Other: change dressing as ordered and do not get dressing wet Methods: Demonstration, Explain/Verbal Responses: State content correctly Electronic Signature(s) Signed: 10/18/2016 4:59:27 PM By: Alric Quan Entered By: Alric Quan on 10/18/2016 09:27:39 [name] ([id number]) -------------------------------------------------------------------------------- Wound Assessment Details Patient Name: Brooke Hopkins, Brooke Hopkins. Date of Service: 10/18/2016 8:45 AM Medical Record Number: [id number] Patient Account Number: [id number] Date of Birth/Sex: [birth year] (65 y.o. Female) Treating RN: Ahmed Prima Primary Care Physician: FITZGERALD, DAVID Other Clinician: Referring Physician: FITZGERALD, DAVID Treating  Physician/Extender: Frann Rider in Treatment: 17 Wound Status Wound Number: 1 Primary Etiology: Lymphedema Wound Location: Left Lower Leg - Wound Status: Open Circumfernential Wounding Event: Gradually Appeared Date Acquired: 12/31/2015 Weeks Of Treatment: 17 Clustered Wound: No Photos Photo Uploaded By: Alric Quan on 10/18/2016 12:06:27 Wound Measurements Length: (cm) 4.7 Width: (cm) 5 Depth: (cm) 0.2 Area: (cm) 18.457 Volume: (cm) 3.691 % Reduction in Area: 81.6% % Reduction in Volume: 81.6% Epithelialization: Large (67-100%) Tunneling: No Undermining: No Wound Description Classification: Partial Thickness Wound Margin: Flat and Intact Exudate Amount: Large Exudate Type: Serosanguineous Exudate Color: red, brown Foul Odor After Cleansing: Yes Due to Product Use: No Wound Bed Granulation Amount: Large (67-100%) Exposed Structure Granulation Quality: Red Fascia Exposed: No Necrotic Amount: Small (1-33%) Fat Layer Exposed: No Brooke Hopkins, Brooke Hopkins ([id number]) Necrotic Quality: Adherent Slough Tendon Exposed: No Muscle Exposed: No Joint Exposed: No Bone Exposed: No Limited to Skin Breakdown Periwound Skin Texture Texture Color No Abnormalities Noted: No No Abnormalities Noted: No Callus: No Atrophie Blanche: No Crepitus: No Cyanosis: No Excoriation: No Ecchymosis: No Fluctuance: No Erythema: No Friable: No Hemosiderin Staining: Yes Induration: No Mottled: No Localized Edema: Yes Pallor: No Rash: No Rubor: No Scarring: Yes Temperature / Pain Moisture Temperature: No Abnormality No Abnormalities Noted: No Tenderness on Palpation: Yes Dry / Scaly: No Maceration: Yes Moist: Yes Wound Preparation Ulcer Cleansing: Other: soap and water, Topical Anesthetic Applied: Other: lidocaine 4%, Treatment Notes Wound #1 (Left, Circumferential Lower Leg) 1. Cleansed with: Clean wound with Normal Saline Cleanse wound with antibacterial soap  and water 2. Anesthetic Topical Lidocaine 4% cream to wound bed prior to debridement 5. Secondary Dressing Applied ABD Pad 7. Secured with 3 Layer Compression System - Bilateral Notes xtrasorb, unna to anchor, silver Biomedical engineer) Signed: 10/18/2016 4:59:27 PM By: Alric Quan Entered By: Alric Quan on 10/18/2016 09:25:27 Brooke Hopkins, Brooke Hopkins ([id number]) MCKINLEY, ADELSTEIN ([id number]) -------------------------------------------------------------------------------- Wound Assessment Details Patient Name: RAVAN, SCHLEMMER. Date of Service: 10/18/2016 8:45 AM Medical Record Number: [id number] Patient Account Number: [id number] Date of Birth/Sex: [birth year] (65 y.o. Female) Treating RN: Ahmed Prima Primary Care Physician:  FITZGERALD, DAVID Other Clinician: Referring Physician: FITZGERALD, DAVID Treating Physician/Extender: Frann Rider in Treatment: 17 Wound Status Wound Number: 2 Primary Etiology: Lymphedema Wound Location: Right Lower Leg - Wound Status: Open Circumfernential Wounding Event: Gradually Appeared Date Acquired: 12/31/2015 Weeks Of Treatment: 17 Clustered Wound: Yes Photos Photo Uploaded By: Alric Quan on 10/18/2016 12:06:28 Wound Measurements Length: (cm) 25 Width: (cm) 28 Depth: (cm) 0.2 Clustered Quantity: 3 Area: (cm) 549.779 Volume: (cm) 109.956 % Reduction in Area: -143.1% % Reduction in Volume: -143.1% Epithelialization: None Tunneling: No Undermining: No Wound Description Classification: Partial Thickness Wound Margin: Indistinct, nonvisible Exudate Amount: Large Exudate Type: Serosanguineous Exudate Color: red, brown Foul Odor After Cleansing: No Wound Bed Granulation Amount: Small (1-33%) Exposed Structure Granulation Quality: Red Fascia Exposed: No MERELYN, KLUMP ([id number]) Necrotic Amount: Large (67-100%) Fat Layer Exposed: No Necrotic Quality: Eschar, Adherent Slough Tendon  Exposed: No Muscle Exposed: No Joint Exposed: No Bone Exposed: No Limited to Skin Breakdown Periwound Skin Texture Texture Color No Abnormalities Noted: No No Abnormalities Noted: No Callus: No Atrophie Blanche: No Crepitus: No Cyanosis: No Excoriation: No Ecchymosis: No Fluctuance: No Erythema: Yes Friable: No Erythema Location: Circumferential Induration: No Hemosiderin Staining: Yes Localized Edema: Yes Mottled: No Rash: No Pallor: No Scarring: Yes Rubor: No Moisture Temperature / Pain No Abnormalities Noted: No Temperature: No Abnormality Dry / Scaly: No Tenderness on Palpation: Yes Maceration: Yes Moist: Yes Wound Preparation Ulcer Cleansing: Other: soap and water, Topical Anesthetic Applied: Other: lidocaine 4%, Treatment Notes Wound #2 (Right, Circumferential Lower Leg) 1. Cleansed with: Clean wound with Normal Saline Cleanse wound with antibacterial soap and water 2. Anesthetic Topical Lidocaine 4% cream to wound bed prior to debridement 5. Secondary Dressing Applied ABD Pad 7. Secured with 3 Layer Compression System - Bilateral Notes xtrasorb, unna to anchor, silver Biomedical engineer) Signed: 10/18/2016 4:59:27 PM By: Alric Quan Entered By: Alric Quan on 10/18/2016 09:23:32 ALYX, MCGUIRK ([id number]) EMONNI, DEPASQUALE ([id number]) -------------------------------------------------------------------------------- Vitals Details Patient Name: MEKESHA, SOLOMON. Date of Service: 10/18/2016 8:45 AM Medical Record Number: [id number] Patient Account Number: [id number] Date of Birth/Sex: [birth year] (65 y.o. Female) Treating RN: Ahmed Prima Primary Care Physician: FITZGERALD, DAVID Other Clinician: Referring Physician: FITZGERALD, DAVID Treating Physician/Extender: Frann Rider in Treatment: 17 Vital Signs Time Taken: 09:01 Temperature (F): 98.0 Height (in): 67 Pulse (bpm): 68 Weight (lbs):  300 Respiratory Rate (breaths/min): 20 Body Mass Index (BMI): 47 Blood Pressure (mmHg): 186/72 Reference Range: 80 - 120 mg / dl Notes Made Dr. Con Memos aware of BP. Electronic Signature(s) Signed: 10/18/2016 4:59:27 PM By: Alric Quan Entered By: Alric Quan on 10/18/2016 09:20:15

## 2016-10-25 ENCOUNTER — Encounter: Payer: 59 | Admitting: Surgery

## 2016-10-25 DIAGNOSIS — L97222 Non-pressure chronic ulcer of left calf with fat layer exposed: Secondary | ICD-10-CM | POA: Diagnosis not present

## 2016-10-25 DIAGNOSIS — E11622 Type 2 diabetes mellitus with other skin ulcer: Secondary | ICD-10-CM | POA: Diagnosis not present

## 2016-10-25 DIAGNOSIS — I890 Lymphedema, not elsewhere classified: Secondary | ICD-10-CM | POA: Diagnosis not present

## 2016-10-25 DIAGNOSIS — L97212 Non-pressure chronic ulcer of right calf with fat layer exposed: Secondary | ICD-10-CM | POA: Diagnosis not present

## 2016-10-26 NOTE — Progress Notes (Signed)
LOVELL, ROE ([id number]) Visit Report for 10/25/2016 Chief Complaint Document Details Patient Name: Brooke Hopkins, Brooke Hopkins 10/25/2016 8:45 Date of Service: AM Medical Record [id number] Number: Patient Account Number: [id number] 03-04-1951 (65 y.o. Treating RN: Ahmed Prima Date of Birth/Sex: Female) Other Clinician: Primary Care Physician: FITZGERALD, DAVID Treating Christin Fudge Referring Physician: FITZGERALD, DAVID Physician/Extender: Weeks in Treatment: 18 Information Obtained from: Patient Chief Complaint Patients presents for treatment of an open diabetic ulcer and significant lymphedema which she's had for about two years Electronic Signature(s) Signed: 10/25/2016 9:37:23 AM By: Christin Fudge MD, FACS Entered By: Christin Fudge on 10/25/2016 09:37:22 [name] ([id number]) -------------------------------------------------------------------------------- Debridement Details Patient Name: Brooke, Hopkins 10/25/2016 8:45 Date of Service: AM Medical Record [id number] Number: Patient Account Number: [id number] Feb 08, 1951 (65 y.o. Treating RN: Ahmed Prima Date of Birth/Sex: Female) Other Clinician: Primary Care Physician: FITZGERALD, DAVID Treating Christin Fudge Referring Physician: FITZGERALD, DAVID Physician/Extender: Suella Grove in Treatment: 18 Debridement Performed for Wound #1 Left,Circumferential Lower Leg Assessment: Performed By: Physician Christin Fudge, MD Debridement: Debridement Pre-procedure Yes - 09:26 Verification/Time Out Taken: Start Time: 09:29 Pain Control: Lidocaine 4% Topical Solution Level: Skin/Subcutaneous Tissue Total Area Debrided (L x 3 (cm) x 3 (cm) = 9 (cm) W): Tissue and other Viable, Non-Viable, Exudate, Fibrin/Slough, Subcutaneous material debrided: Instrument: Curette Bleeding: Minimum Hemostasis Achieved: Pressure End Time: 09:31 Procedural Pain: 0 Post Procedural Pain: 0 Response to Treatment: Procedure was  tolerated well Post Debridement Measurements of Total Wound Length: (cm) 3 Width: (cm) 3 Depth: (cm) 0.2 Volume: (cm) 1.414 Character of Wound/Ulcer Post Requires Further Debridement Debridement: Severity of Tissue Post Debridement: Fat layer exposed Post Procedure Diagnosis Same as Pre-procedure Electronic Signature(s) Signed: 10/25/2016 9:37:04 AM By: Christin Fudge MD, FACS Signed: 10/25/2016 5:06:12 PM By: Tedra Coupe ([id number]) Entered By: Christin Fudge on 10/25/2016 09:37:04 [name] ([id number]) -------------------------------------------------------------------------------- Debridement Details Patient Name: Brooke, Hopkins 10/25/2016 8:45 Date of Service: AM Medical Record [id number] Number: Patient Account Number: [id number] 1951-12-14 (65 y.o. Treating RN: Ahmed Prima Date of Birth/Sex: Female) Other Clinician: Primary Care Physician: FITZGERALD, DAVID Treating Christin Fudge Referring Physician: FITZGERALD, DAVID Physician/Extender: Suella [place] in Treatment: 18 Debridement Performed for Wound #2 Right,Circumferential Lower Leg Assessment: Performed By: Physician Christin Fudge, MD Debridement: Debridement Pre-procedure Yes - 09:26 Verification/Time Out Taken: Start Time: 09:27 Pain Control: Lidocaine 4% Topical Solution Level: Skin/Subcutaneous Tissue Total Area Debrided (L x 5 (cm) x 5 (cm) = 25 (cm) W): Tissue and other Viable, Non-Viable, Exudate, Fibrin/Slough, Subcutaneous material debrided: Instrument: Curette Bleeding: Minimum Hemostasis Achieved: Pressure End Time: 09:29 Procedural Pain: 0 Post Procedural Pain: 0 Response to Treatment: Procedure was tolerated well Post Debridement Measurements of Total Wound Length: (cm) 5 Width: (cm) 5 Depth: (cm) 0.2 Volume: (cm) 3.927 Character of Wound/Ulcer Post Requires Further Debridement Debridement: Severity of Tissue Post Debridement: Fat layer  exposed Post Procedure Diagnosis Same as Pre-procedure Electronic Signature(s) Signed: 10/25/2016 9:37:14 AM By: Christin Fudge MD, FACS Signed: 10/25/2016 5:06:12 PM By: Tedra Coupe ([id number]) Entered By: Christin Fudge on 10/25/2016 09:37:13 LEOMIA, BLAKE ([id number]) -------------------------------------------------------------------------------- HPI Details Patient Name: Brooke, Hopkins 10/25/2016 8:45 Date of Service: AM Medical Record [id number] Number: Patient Account Number: [id number] 1951-09-28 (65 y.o. Treating RN: Ahmed Prima Date of Birth/Sex: Female) Other Clinician: Primary Care Physician: FITZGERALD, DAVID Treating Christin Fudge Referring Physician: FITZGERALD, DAVID Physician/Extender: Weeks in Treatment: 18 History of Present Illness Location: massive swelling of both lower extremities and ulceration both lower extremities Quality: Patient reports experiencing  a dull pain to affected area(s). Severity: Patient states wound are getting worse. Duration: Patient has had the wound for >2 years prior to seeking treatment at the wound center Timing: Pain in wound is constant (hurts all the time) Context: The wound appeared gradually over time Modifying Factors: Other treatment(s) tried include:she has a lymphedema pump but uses it seldom and she's had several course of antibiotics Associated Signs and Symptoms: Patient reports having difficulty standing for long periods. HPI Description: 65 year old patient seen by Dr. Ola Spurr of infectious disease who has been following up for left lower extremity cellulitis and ulcer with recurrent bilateral lower extremity problems for several months. Recently she had a large right lower extremity bullae which opened out and has been ulcerated. She has seen the vascular group and has been getting Unna's wraps and has a lymphedema pump used in the past. Her prior cultures were positive for  Pseudomonas, Proteus and was treated with amoxicillin. He has also been treated with 2 weeks course of ciprofloxacin and amoxicillin.. Increase of Lasix dose helped with the edema and echo showed no systolic CHF but may be diastolic problems. Past medical history significant for diabetes mellitus type 2, venous stasis ulcer, obesity, diabetic peripheral neuropathy, status post back surgery, cholecystectomy, hysterectomy, arthroscopy of the knee. He is a former smoker and quit smoking in 1984. The patient has seen Dr. Delana Meyer who did not recommend any arterial or venous duplex studies and has been using Unna's wraps and also recommended a lymphedema pump. she has been very noncompliant with using these. 06/28/2016 -- the patient is still on antibiotics as prescribed by Dr. Ola Spurr and he is asked her to take it for 3 weeks. The patient also says she has a lot of redness and pain in the folds of her thigh and lower extremity and this is very painful. 07/26/2016 -- the patient is off antibiotics and has been getting dressing changes 3 times a week. 08/09/2016 -- is been on Cipro and is taking potassium supplements along with her Lasix and is going to be seeing Dr. Ola Spurr for a consult return visit only in November 2017. 08/23/2016 -- she was admitted to the hospital last week and I have reviewed these reports in detail. She was seen by Dr. Ola Spurr who noted a Pseudomonas infection which was resistant to ciprofloxacin and discharged her on Ceftazidime 1 g IV every 12 hourly anyone days. The antibiotic was to be stopped on September 11 and he would see her back in the clinic. MIKYA, DON ([id number]) 08/30/2016 -- had a communication from Dr. Ola Spurr that he would extend her antibiotics by a week if she continued to look like cellulitis was persisting. 09/13/2016 -- the PICC line is out and antibiotics have stopped. 10/04/16: returns today for f/u. reports utilizes compression  pump twice daily. she is compliant with her compression therapy. she reports a new blister on the right proximal posterior calf. no systemic s/s of infection. 10/11/16: returns today for f/u. she reports adherence to her compression pumps, but there is no improvement regarding her BLE edema. there is weeping of fluid. denies fever, chills, body aches or malaise. 10/18/2016 -- she has a significant cellulitis of her right lower extremity which was not there last week. I believe at this stage she will need the expertise of Dr. Ola Spurr to decide on antibiotic coverage. 10/25/2016 -- the patient has had cipro and ampicillin been started by Dr. Ola Spurr and he is awaiting further cultures. Overall she's been feeling  better. Electronic Signature(s) Signed: 10/25/2016 9:38:25 AM By: Christin Fudge MD, FACS Entered By: Christin Fudge on 10/25/2016 09:38:24 [name] ([id number]) -------------------------------------------------------------------------------- Physical Exam Details Patient Name: ZEINA, AKKERMAN 10/25/2016 8:45 Date of Service: AM Medical Record [id number] Number: Patient Account Number: [id number] 10-06-1951 (65 y.o. Treating RN: Ahmed Prima Date of Birth/Sex: Female) Other Clinician: Primary Care Physician: FITZGERALD, DAVID Treating Christin Fudge Referring Physician: FITZGERALD, DAVID Physician/Extender: Weeks in Treatment: 18 Constitutional . Pulse regular. Respirations normal and unlabored. Afebrile. . Eyes Nonicteric. Reactive to light. Ears, Nose, Mouth, and Throat Lips, teeth, and gums WNL.Marland Kitchen Moist mucosa without lesions. Neck supple and nontender. No palpable supraclavicular or cervical adenopathy. Normal sized without goiter. Respiratory WNL. No retractions.. Breath sounds WNL, No rubs, rales, rhonchi, or wheeze.. Cardiovascular Heart rhythm and rate regular, no murmur or gallop.. Pedal Pulses WNL. No clubbing, cyanosis or  edema. Lymphatic No adneopathy. No adenopathy. No adenopathy. Musculoskeletal Adexa without tenderness or enlargement.. Digits and nails w/o clubbing, cyanosis, infection, petechiae, ischemia, or inflammatory conditions.. Integumentary (Hair, Skin) No suspicious lesions. No crepitus or fluctuance. No peri-wound warmth or erythema. No masses.Marland Kitchen Psychiatric Judgement and insight Intact.. No evidence of depression, anxiety, or agitation.. Notes him for edema persists but the cellulitis is much better than last week and both posterior ulcerations on right and left lower extremity need sharp debridement with a #3 curet and bleeding is controlled with pressure. Electronic Signature(s) Signed: 10/25/2016 9:39:05 AM By: Christin Fudge MD, FACS Entered By: Christin Fudge on 10/25/2016 09:39:03 [name] ([id number]) -------------------------------------------------------------------------------- Physician Orders Details Patient Name: CHIZUKO, TRINE 10/25/2016 8:45 Date of Service: AM Medical Record [id number] Number: Patient Account Number: [id number] 1951/04/21 (65 y.o. Treating RN: Ahmed Prima Date of Birth/Sex: Female) Other Clinician: Primary Care Physician: FITZGERALD, DAVID Treating Christin Fudge Referring Physician: FITZGERALD, DAVID Physician/Extender: Suella Grove in Treatment: 43 Verbal / Phone Orders: Yes Clinician: Carolyne Fiscal, Debi Read Back and Verified: Yes Diagnosis Coding Wound Cleansing Wound #1 Left,Circumferential Lower Leg o Cleanse wound with mild soap and water o May Shower, gently pat wound dry prior to applying new dressing. o May shower with protection. Wound #2 Right,Circumferential Lower Leg o Cleanse wound with mild soap and water o May Shower, gently pat wound dry prior to applying new dressing. o May shower with protection. Anesthetic Wound #1 Left,Circumferential Lower Leg o Topical Lidocaine 4% cream applied to wound bed prior  to debridement Wound #2 Right,Circumferential Lower Leg o Topical Lidocaine 4% cream applied to wound bed prior to debridement Skin Barriers/Peri-Wound Care Wound #1 Left,Circumferential Lower Leg o Other: - Lotrisone cream in folds at knees and on reddened areas on legs (Nystatin used in clinic) Wound #2 Right,Circumferential Lower Leg o Other: - Lotrisone cream in folds at knees and on reddened areas on legs (Nystatin used in clinic) Primary Wound Dressing Wound #1 Left,Circumferential Lower Leg o Aquacel Ag - aquacel ag on all other open areas Wound #2 Right,Circumferential Lower Leg o Aquacel Ag - aquacel ag on all other open areas Secondary Dressing Wound #1 Left,Circumferential Lower Leg NICOLAS, SISLER ([id number]) o ABD pad o XtraSorb Wound #2 Right,Circumferential Lower Leg o ABD pad o XtraSorb Dressing Change Frequency Wound #1 Left,Circumferential Lower Leg o Change Dressing Monday, Wednesday, Friday - HHRN to change wraps Monday and Wednesay Wound #2 Right,Circumferential Lower Leg o Change Dressing Monday, Wednesday, Friday - HHRN to change wraps Monday and Wednesay Follow-up Appointments Wound #1 Left,Circumferential Lower Leg o Return Appointment in 1 week. Wound #2 Right,Circumferential Lower  Leg o Return Appointment in 1 week. Edema Control Wound #1 Left,Circumferential Lower Leg o 3 Layer Compression System - Bilateral - May anchor top of wrap with dome paste, but only wrap around leg one time. o Elevate legs to the level of the heart and pump ankles as often as possible Wound #2 Right,Circumferential Lower Leg o 3 Layer Compression System - Bilateral - May anchor top of wrap with dome paste, but only wrap around leg one time. o Elevate legs to the level of the heart and pump ankles as often as possible Home Health Wound #1 Left,Circumferential Lower Leg o [facility] Visits - [place] Nurse  may visit PRN to address patientos wound care needs. o FACE TO FACE ENCOUNTER: MEDICARE and MEDICAID PATIENTS: I certify that this patient is under my care and that I had a face-to-face encounter that meets the physician face-to-face encounter requirements with this patient on this date. The encounter with the patient was in whole or in part for the following MEDICAL CONDITION: (primary reason for [place]) MEDICAL NECESSITY: I certify, that based on my findings, NURSING services are a medically necessary home health service. HOME BOUND STATUS: I certify that my clinical findings support that this patient is homebound (i.e., Due to illness or injury, pt requires aid of supportive devices such as crutches, cane, wheelchairs, walkers, the use of special transportation or the assistance of another person to leave their place of residence. There is a normal inability to leave the home and doing so requires considerable and taxing effort. Other absences are for medical reasons / religious services and are infrequent or of short duration when for other reasons). DESHANTI, ADCOX ([id number]) o Please direct any NON-WOUND related issues/requests for orders to patient's Primary Care Physician Wound #2 Right,Circumferential Lower Leg o [place] Visits - [place] Nurse may visit PRN to address patientos wound care needs. o FACE TO FACE ENCOUNTER: MEDICARE and MEDICAID PATIENTS: I certify that this patient is under my care and that I had a face-to-face encounter that meets the physician face-to-face encounter requirements with this patient on this date. The encounter with the patient was in whole or in part for the following MEDICAL CONDITION: (primary reason for [place]) MEDICAL NECESSITY: I certify, that based on my findings, NURSING services are a medically necessary home health service. HOME BOUND STATUS: I certify that my clinical  findings support that this patient is homebound (i.e., Due to illness or injury, pt requires aid of supportive devices such as crutches, cane, wheelchairs, walkers, the use of special transportation or the assistance of another person to leave their place of residence. There is a normal inability to leave the home and doing so requires considerable and taxing effort. Other absences are for medical reasons / religious services and are infrequent or of short duration when for other reasons). o Please direct any NON-WOUND related issues/requests for orders to patient's Primary Care Physician Electronic Signature(s) Signed: 10/25/2016 4:11:36 PM By: Christin Fudge MD, FACS Signed: 10/25/2016 5:06:12 PM By: Alric Quan Entered By: Alric Quan on 10/25/2016 09:32:27 [name] ([id number]) -------------------------------------------------------------------------------- Problem List Details Patient Name: BREIONNA, PUNT 10/25/2016 8:45 Date of Service: AM Medical Record [id number] Number: Patient Account Number: [id number] 23-Dec-1951 (65 y.o. 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 10/25/2016 8:45 Date of Service: AM Medical Record [id number] Number: Patient Account Number: [id number] 10/02/1951 (65 y.o. Treating RN: Ahmed Prima Date of Birth/Sex: Female) Other Clinician: Primary Care Physician: FITZGERALD, DAVID Treating Christin Fudge Referring Physician: FITZGERALD, DAVID Physician/Extender: Weeks in Treatment: 18 Subjective Chief Complaint Information obtained from Patient Patients presents for treatment of an open diabetic ulcer and significant lymphedema which she's had for about two years History of Present Illness (HPI) The following HPI elements were documented for the patient's wound: Location: massive swelling of both lower extremities and ulceration both lower extremities Quality: Patient reports experiencing a dull pain to affected area(s). Severity: Patient states wound are getting worse. Duration: Patient has had the wound for >2 years prior to seeking treatment at the wound center Timing: Pain in wound is constant (hurts all the time) Context: The wound appeared gradually over time Modifying Factors: Other treatment(s) tried include:she has a lymphedema pump but uses it seldom and she's had several course of antibiotics Associated Signs and Symptoms: Patient reports having difficulty standing for long periods. 65 year old patient seen by Dr. Ola Spurr of infectious disease who has been following up for left lower extremity cellulitis and ulcer with recurrent bilateral lower extremity problems for several months. Recently she had a large right lower extremity bullae which opened out and has been ulcerated. She has seen the vascular group and has been getting Unna's wraps and has a lymphedema pump used in the past. Her prior cultures were positive for Pseudomonas,  Proteus and was treated with amoxicillin. He has also been treated with 2 weeks course of ciprofloxacin and amoxicillin.. Increase of Lasix dose helped with the edema and echo showed no systolic CHF but may be diastolic problems. Past medical history significant for diabetes mellitus type 2, venous stasis ulcer, obesity, diabetic peripheral neuropathy, status post back surgery, cholecystectomy, hysterectomy, arthroscopy of the knee. He is a former smoker and quit smoking in 1984. The patient has seen Dr. Delana Meyer who did not recommend any arterial or venous duplex studies and has been using Unna's wraps and also recommended a lymphedema pump. she has been very noncompliant with using these. 06/28/2016 -- the patient is still on antibiotics as prescribed by Dr. Ola Spurr and he is asked her to take it for 3 weeks. The patient also says she has a lot of redness and pain in the folds of her thigh and lower extremity and this is very painful. RONDA, RAJKUMAR ([id number]) 07/26/2016 -- the patient is off antibiotics and has been getting dressing changes 3 times a week. 08/09/2016 -- is been on Cipro and is taking potassium supplements along with her Lasix and is going to be seeing Dr. Ola Spurr for a consult return visit only in November 2017. 08/23/2016 -- she was admitted to the hospital last week and I have reviewed these reports in detail. She was seen by Dr. Ola Spurr who noted a Pseudomonas infection which was resistant to ciprofloxacin and discharged her on Ceftazidime  1 g IV every 12 hourly anyone days. The antibiotic was to be stopped on September 11 and he would see her back in the clinic. 08/30/2016 -- had a communication from Dr. Ola Spurr that he would extend her antibiotics by a week if she continued to look like cellulitis was persisting. 09/13/2016 -- the PICC line is out and antibiotics have stopped. 10/04/16: returns today for f/u. reports utilizes compression pump twice  daily. she is compliant with her compression therapy. she reports a new blister on the right proximal posterior calf. no systemic s/s of infection. 10/11/16: returns today for f/u. she reports adherence to her compression pumps, but there is no improvement regarding her BLE edema. there is weeping of fluid. denies fever, chills, body aches or malaise. 10/18/2016 -- she has a significant cellulitis of her right lower extremity which was not there last week. I believe at this stage she will need the expertise of Dr. Ola Spurr to decide on antibiotic coverage. 10/25/2016 -- the patient has had cipro and ampicillin been started by Dr. Ola Spurr and he is awaiting further cultures. Overall she's been feeling better. Objective Constitutional Pulse regular. Respirations normal and unlabored. Afebrile. Vitals Time Taken: 9:09 AM, Height: 67 in, Weight: 300 lbs, BMI: 47, Temperature: 98.1 F, Pulse: 68 bpm, Respiratory Rate: 20 breaths/min, Blood Pressure: 166/55 mmHg. General Notes: Made Dr. Con Memos aware of pt's BP. Eyes Nonicteric. Reactive to light. Ears, Nose, Mouth, and Throat Lips, teeth, and gums WNL.Marland Kitchen Moist mucosa without lesions. Neck supple and nontender. No palpable supraclavicular or cervical adenopathy. Normal sized without goiter. Respiratory CHARLE, MCLAURIN ([id number]) WNL. No retractions.. Breath sounds WNL, No rubs, rales, rhonchi, or wheeze.. Cardiovascular Heart rhythm and rate regular, no murmur or gallop.. Pedal Pulses WNL. No clubbing, cyanosis or edema. Lymphatic No adneopathy. No adenopathy. No adenopathy. Musculoskeletal Adexa without tenderness or enlargement.. Digits and nails w/o clubbing, cyanosis, infection, petechiae, ischemia, or inflammatory conditions.Marland Kitchen Psychiatric Judgement and insight Intact.. No evidence of depression, anxiety, or agitation.. General Notes: him for edema persists but the cellulitis is much better than last week and both  posterior ulcerations on right and left lower extremity need sharp debridement with a #3 curet and bleeding is controlled with pressure. Integumentary (Hair, Skin) No suspicious lesions. No crepitus or fluctuance. No peri-wound warmth or erythema. No masses.. Wound #1 status is Open. Original cause of wound was Gradually Appeared. The wound is located on the Left,Circumferential Lower Leg. The wound measures 4.7cm length x 5cm width x 0.2cm depth; 18.457cm^2 area and 3.691cm^3 volume. The wound is limited to skin breakdown. There is no undermining noted. There is a large amount of serosanguineous drainage noted. The wound margin is flat and intact. There is medium (34-66%) red granulation within the wound bed. There is a medium (34-66%) amount of necrotic tissue within the wound bed including Adherent Slough. The periwound skin appearance exhibited: Localized Edema, Scarring, Maceration, Moist, Hemosiderin Staining. The periwound skin appearance did not exhibit: Callus, Crepitus, Excoriation, Fluctuance, Friable, Induration, Rash, Dry/Scaly, Atrophie Blanche, Cyanosis, Ecchymosis, Mottled, Pallor, Rubor, Erythema. Periwound temperature was noted as No Abnormality. The periwound has tenderness on palpation. Wound #2 status is Open. Original cause of wound was Gradually Appeared. The wound is located on the Right,Circumferential Lower Leg. The wound measures 25cm length x 28cm width x 0.2cm depth; 549.779cm^2 area and 109.956cm^3 volume. The wound is limited to skin breakdown. There is no tunneling or undermining noted. There is a large amount of serosanguineous drainage noted. The wound margin  is indistinct and nonvisible. There is medium (34-66%) red granulation within the wound bed. There is a medium (34-66%) amount of necrotic tissue within the wound bed including Eschar and Adherent Slough. The periwound skin appearance exhibited: Localized Edema, Scarring, Maceration, Moist, Hemosiderin  Staining, Erythema. The periwound skin appearance did not exhibit: Callus, Crepitus, Excoriation, Fluctuance, Friable, Induration, Rash, Dry/Scaly, Atrophie Blanche, Cyanosis, Ecchymosis, Mottled, Pallor, Rubor. The surrounding wound skin color is noted with erythema which is circumferential. Periwound temperature was noted as No Abnormality. The periwound has tenderness on palpation. 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 There was a Skin/Subcutaneous Tissue Debridement ([id number]) debridement with total area of 9 sq cm performed by Christin Fudge, MD. with the following instrument(s): Curette to remove Viable and Non-Viable tissue/material including Exudate, Fibrin/Slough, and Subcutaneous after achieving pain control using Lidocaine 4% Topical Solution. A time out was conducted at 09:26, prior to the start of the procedure. A Minimum amount of bleeding was controlled with Pressure. The procedure was tolerated well with a pain level of 0 throughout and a pain level of 0 following the procedure. Post Debridement Measurements: 3cm length x 3cm width x 0.2cm depth; 1.414cm^3 volume. Character of Wound/Ulcer Post Debridement requires further debridement. Severity of Tissue Post Debridement is: Fat layer exposed. Post procedure Diagnosis Wound #1: Same as Pre-Procedure Wound #2 Wound #2 is a Lymphedema located on the Right,Circumferential Lower Leg . There was a Skin/Subcutaneous Tissue Debridement ([id number]) debridement with total area of 25 sq cm performed by Christin Fudge, MD. with the following instrument(s): Curette to remove Viable and Non-Viable tissue/material including Exudate, Fibrin/Slough, and Subcutaneous after achieving pain control using Lidocaine 4% Topical Solution. A time out was conducted at 09:26, prior to the start of the procedure. A Minimum amount of bleeding was controlled with Pressure. The procedure was tolerated well with a pain level of 0 throughout and a pain level of 0 following the procedure. Post Debridement Measurements: 5cm length x 5cm width x 0.2cm depth; 3.927cm^3 volume. Character of Wound/Ulcer Post Debridement requires further debridement. Severity of Tissue Post Debridement is: Fat layer exposed. Post procedure Diagnosis Wound #2: Same as Pre-Procedure AZALIAH, CARRERO ([id number]) Plan Wound Cleansing: Wound #1 Left,Circumferential Lower Leg: Cleanse wound with mild soap and water May Shower, gently pat wound dry prior to applying new dressing. May shower with protection. Wound #2 Right,Circumferential Lower Leg: Cleanse wound with mild soap and water May Shower, gently pat wound dry prior to applying new dressing. May shower with protection. Anesthetic: Wound #1 Left,Circumferential Lower Leg: Topical Lidocaine 4% cream applied to wound bed prior to debridement Wound #2 Right,Circumferential Lower Leg: Topical Lidocaine 4% cream applied to wound bed prior to debridement Skin Barriers/Peri-Wound Care: Wound #1 Left,Circumferential Lower Leg: Other: - Lotrisone cream in folds at knees and on reddened areas on legs (Nystatin used in clinic) Wound #2 Right,Circumferential Lower Leg: Other: - Lotrisone cream in folds at knees and on reddened areas on legs (Nystatin used in clinic) Primary Wound Dressing: Wound #1 Left,Circumferential Lower Leg: Aquacel Ag - aquacel ag on all other open areas Wound #2 Right,Circumferential Lower Leg:  Aquacel Ag - aquacel ag on all other open areas Secondary Dressing: Wound  #1 Left,Circumferential Lower Leg: ABD pad XtraSorb Wound #2 Right,Circumferential Lower Leg: ABD pad XtraSorb Dressing Change Frequency: Wound #1 Left,Circumferential Lower Leg: Change Dressing Monday, Wednesday, Friday - HHRN to change wraps Monday and Wednesay Wound #2 Right,Circumferential Lower Leg: Change Dressing Monday, Wednesday, Friday - HHRN to change wraps Monday and Wednesay Follow-up Appointments: Wound #1 Left,Circumferential Lower Leg: Return Appointment in 1 week. Wound #2 Right,Circumferential Lower Leg: Return Appointment in 1 week. Edema Control: Wound #1 Left,Circumferential Lower Leg: 3 Layer Compression System - Bilateral - May anchor top of wrap with dome paste, but only wrap around leg one time. Elevate legs to the level of the heart and pump ankles as often as possible Wound #2 Right,Circumferential Lower Leg: 3 Layer Compression System - Bilateral - May anchor top of wrap with dome paste, but only wrap around LARANDA, BURKEMPER. ([id number]) leg one time. Elevate legs to the level of the heart and pump ankles as often as possible Home Health: Wound #1 Left,Circumferential Lower Leg: Continue Home Health Visits - [place] Nurse may visit PRN to address patient s wound care needs. FACE TO FACE ENCOUNTER: MEDICARE and MEDICAID PATIENTS: I certify that this patient is under my care and that I had a face-to-face encounter that meets the physician face-to-face encounter requirements with this patient on this date. The encounter with the patient was in whole or in part for the following MEDICAL CONDITION: (primary reason for [place]) MEDICAL NECESSITY: I certify, that based on my findings, NURSING services are a medically necessary home health service. HOME BOUND STATUS: I certify that my clinical findings support that this patient is homebound (i.e., Due to illness or injury, pt requires aid of supportive devices such as crutches, cane,  wheelchairs, walkers, the use of special transportation or the assistance of another person to leave their place of residence. There is a normal inability to leave the home and doing so requires considerable and taxing effort. Other absences are for medical reasons / religious services and are infrequent or of short duration when for other reasons). Please direct any NON-WOUND related issues/requests for orders to patient's Primary Care Physician Wound #2 Right,Circumferential Lower Leg: [place] Visits - [place] Nurse may visit PRN to address patient s wound care needs. FACE TO FACE ENCOUNTER: MEDICARE and MEDICAID PATIENTS: I certify that this patient is under my care and that I had a face-to-face encounter that meets the physician face-to-face encounter requirements with this patient on this date. The encounter with the patient was in whole or in part for the following MEDICAL CONDITION: (primary reason for [place]) MEDICAL NECESSITY: I certify, that based on my findings, NURSING services are a medically necessary home health service. HOME BOUND STATUS: I certify that my clinical findings support that this patient is homebound (i.e., Due to illness or injury, pt requires aid of supportive devices such as crutches, cane, wheelchairs, walkers, the use of special transportation or the assistance of another person to leave their place of residence. There is a normal inability to leave the home and doing so requires considerable and taxing effort. Other absences are for medical reasons / religious services and are infrequent or of short duration when for other reasons). Please direct any NON-WOUND related issues/requests for orders to patient's Primary Care Physician I have recommended silver alginate, draw text and a 3 layer Profore wrap and given her advice  to continue with the antibiotics as prescribed by Dr. Ola Spurr.She is to proceed to the ER if her  situation worsens over the weekend. Electronic Signature(s) Signed: 10/25/2016 9:39:54 AM By: Christin Fudge MD, FACS Entered By: Christin Fudge on 10/25/2016 09:39:54 [name] ([id number]) -------------------------------------------------------------------------------- SuperBill Details Patient Name: KHARI, LETT. Date of Service: 10/25/2016 Medical Record Number: [id number] Patient Account Number: [id number] Date of Birth/Sex: February 07, 1951 (65 y.o. 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 E66.01 Morbid (severe) obesity due to excess calories Facility Procedures CPT4 Code: [id number] Description: 82423 - DEB SUBQ TISSUE 20 SQ CM/< ICD-10 Description Diagnosis E11.622 Type 2 diabetes mellitus with other skin ulcer I89.0 Lymphedema, not elsewhere classified L97.222 Non-pressure chronic ulcer of left calf with fat l 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 Modifier: ayer exposed layer exposed Quantity: 1 CPT4 Code: [id number] Description: 11045 - DEB SUBQ TISS EA ADDL 20CM ICD-10 Description Diagnosis E11.622 Type 2 diabetes mellitus with other skin ulcer I89.0 Lymphedema, not elsewhere classified L97.222 Non-pressure chronic ulcer of left calf with fat l 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 Modifier: ayer exposed layer exposed Quantity: 1 Physician Procedures CPT4 Code: [id number] [place], [place] Description: 11042 - WC PHYS SUBQ TISS 20 SQ CM ICD-10 Description Diagnosis E11.622 Type 2 diabetes  mellitus with other skin ulcer I89.0 Lymphedema, not elsewhere classified L97.222 Non-pressure chronic ulcer of left calf with fat la NDA M. ([id number]) Modifier: yer exposed Quantity: 1 Electronic Signature(s) Signed: 10/25/2016 9:40:13 AM By: Christin Fudge MD, FACS Entered By: Christin Fudge on 10/25/2016 09:40:12

## 2016-10-26 NOTE — Progress Notes (Signed)
SYNTHIA, [facility]IRBANK ([id number]) Visit Report for 10/25/2016 Arrival Information Details Patient Name: Brooke Hopkins, Brooke Hopkins. Date of Service: 10/25/2016 8:45 AM Medical Record Number: [id number] Patient Account Number: [id number] Date of Birth/Sex: 1951-11-23 (65 y.o. Female) Treating RN: Ahmed Prima Primary Care Physician: FITZGERALD, DAVID Other Clinician: Referring Physician: FITZGERALD, DAVID Treating Physician/Extender: Frann Rider in Treatment: 18 Visit Information History Since Last Visit All ordered tests and consults were completed: No Patient Arrived: Wheel Chair Added or deleted any medications: No Arrival Time: 09:07 Any new allergies or adverse reactions: No Accompanied By: son Had a fall or experienced change in No Transfer Assistance: [employer] activities of daily living that may affect Patient Lift risk of falls: Patient Identification Verified: Yes Signs or symptoms of abuse/neglect since last No Secondary Verification Process Yes visito Completed: Hospitalized since last visit: No Patient Requires Transmission- No Pain Present Now: Yes Based Precautions: Patient Has Alerts: No Electronic Signature(s) Signed: 10/25/2016 5:06:12 PM By: Alric Quan Entered By: Alric Quan on 10/25/2016 09:09:11 [name] ([id number]) -------------------------------------------------------------------------------- Encounter Discharge Information Details Patient Name: [name], [name]. Date of Service: 10/25/2016 8:45 AM Medical Record Number: [id number] Patient Account Number: [id number] Date of Birth/Sex: 16-Oct-1951 (65 y.o. Female) Treating RN: Ahmed Prima Primary Care Physician: FITZGERALD, DAVID Other Clinician: Referring Physician: FITZGERALD, DAVID Treating Physician/Extender: Frann Rider in Treatment: 72 Encounter Discharge Information Items Discharge Pain Level: 0 Discharge Condition: Stable Ambulatory Status:  Wheelchair Discharge Destination: Home Transportation: Private Auto Accompanied By: son Schedule Follow-up Appointment: Yes Medication Reconciliation completed and provided to Patient/Care Yes Elinore Shults: Provided on Clinical Summary of Care: 10/25/2016 Form Type Recipient Paper Patient LB Electronic Signature(s) Signed: 10/25/2016 10:00:20 AM By: Ruthine Dose Entered By: Ruthine Dose on 10/25/2016 10:00:19 [name] ([id number]) -------------------------------------------------------------------------------- Lower Extremity Assessment Details Patient Name: [name], [name]. Date of Service: 10/25/2016 8:45 AM Medical Record Number: [id number] Patient Account Number: [id number] Date of Birth/Sex: [birth year] (65 y.o. Female) Treating RN: Ahmed Prima Primary Care Physician: FITZGERALD, DAVID Other Clinician: Referring Physician: FITZGERALD, DAVID Treating Physician/Extender: Frann Rider in Treatment: 18 Edema Assessment Assessed: [Left: No] [Right: No] E[Left: dema] [Right: :] Calf Left: Right: Point of Measurement: 32 cm From Medial Instep 51.3 cm 49.6 cm Ankle Left: Right: Point of Measurement: 11 cm From Medial Instep 33.5 cm 32.8 cm Vascular Assessment Pulses: Posterior Tibial Dorsalis Pedis Palpable: [Left:Yes] [Right:Yes] Extremity colors, hair growth, and conditions: Extremity Color: [Left:Red] [Right:Red] Temperature of Extremity: [Left:Warm] [Right:Warm] Capillary Refill: [Left:< 3 seconds] [Right:< 3 seconds] Toe Nail Assessment Left: Right: Thick: Yes Yes Discolored: Yes Yes Deformed: No No Improper Length and Hygiene: Yes Yes Electronic Signature(s) Signed: 10/25/2016 5:06:12 PM By: Alric Quan Entered By: Alric Quan on 10/25/2016 09:17:14 [name] ([id number]) -------------------------------------------------------------------------------- Multi Wound Chart Details Patient Name: [name]. Date  of Service: 10/25/2016 8:45 AM Medical Record Number: [id number] Patient Account Number: [id number] Date of Birth/Sex: 1951/05/27 (65 y.o. Female) Treating RN: Ahmed Prima Primary Care Physician: FITZGERALD, DAVID Other Clinician: Referring Physician: FITZGERALD, DAVID Treating Physician/Extender: Frann Rider in Treatment: 18 Vital Signs Height(in): 67 Pulse(bpm): 68 Weight(lbs): 300 Blood Pressure 166/55 (mmHg): Body Mass Index(BMI): 47 Temperature(F): 98.1 Respiratory Rate 20 (breaths/min): Photos: [1:No Photos] [2:No Photos] [N/A:N/A] Wound Location: [1:Left Lower Leg - Circumfernential] [2:Right Lower Leg - Circumfernential] [N/A:N/A] Wounding Event: [1:Gradually Appeared] [2:Gradually Appeared] [N/A:N/A] Primary Etiology: [1:Lymphedema] [2:Lymphedema] [N/A:N/A] Date Acquired: [1:12/31/2015] [2:12/31/2015] [N/A:N/A] Weeks of Treatment: [1:18] [2:18] [N/A:N/A] Wound Status: [1:Open] [2:Open] [N/A:N/A] Clustered Wound: [1:No] [2:Yes] [N/A:N/A] Clustered Quantity: [  1:N/A] [2:3] [N/A:N/A] Measurements L x W x D 4.7x5x0.2 [2:25x28x0.2] [N/A:N/A] (cm) Area (cm) : [1:18.457] [2:549.779] [N/A:N/A] Volume (cm) : [1:3.691] [2:109.956] [N/A:N/A] % Reduction in Area: [1:81.60%] [2:-143.10%] [N/A:N/A] % Reduction in Volume: 81.60% [2:-143.10%] [N/A:N/A] Classification: [1:Partial Thickness] [2:Partial Thickness] [N/A:N/A] Exudate Amount: [1:Large] [2:Large] [N/A:N/A] Exudate Type: [1:Serosanguineous] [2:Serosanguineous] [N/A:N/A] Exudate Color: [1:red, brown] [2:red, brown] [N/A:N/A] Foul Odor After [1:Yes] [2:No] [N/A:N/A] Cleansing: Odor Anticipated Due to No [2:N/A] [N/A:N/A] Product Use: Wound Margin: [1:Flat and Intact] [2:Indistinct, nonvisible] [N/A:N/A] Granulation Amount: [1:Medium (34-66%)] [2:Medium (34-66%)] [N/A:N/A] Granulation Quality: [1:Red] [2:Red] [N/A:N/A] Necrotic Amount: [1:Medium (34-66%)] [2:Medium (34-66%)] [N/A:N/A] Necrotic Tissue:  [1:Adherent Slough] [2:Eschar, Adherent Slough] [N/A:N/A] Exposed Structures: Fascia: No Fascia: No N/A Fat: No Fat: No Tendon: No Tendon: No Muscle: No Muscle: No Joint: No Joint: No Bone: No Bone: No Limited to Skin Limited to Skin Breakdown Breakdown Epithelialization: Large (67-100%) None N/A Periwound Skin Texture: Edema: Yes Edema: Yes N/A Scarring: Yes Scarring: Yes Excoriation: No Excoriation: No Induration: No Induration: No Callus: No Callus: No Crepitus: No Crepitus: No Fluctuance: No Fluctuance: No Friable: No Friable: No Rash: No Rash: No Periwound Skin Maceration: Yes Maceration: Yes N/A Moisture: Moist: Yes Moist: Yes Dry/Scaly: No Dry/Scaly: No Periwound Skin Color: Hemosiderin Staining: Yes Erythema: Yes N/A Atrophie Blanche: No Hemosiderin Staining: Yes Cyanosis: No Atrophie Blanche: No Ecchymosis: No Cyanosis: No Erythema: No Ecchymosis: No Mottled: No Mottled: No Pallor: No Pallor: No Rubor: No Rubor: No Erythema Location: N/A Circumferential N/A Temperature: No Abnormality No Abnormality N/A Tenderness on Yes Yes N/A Palpation: Wound Preparation: Ulcer Cleansing: Other: Ulcer Cleansing: Other: N/A soap and water soap and water Topical Anesthetic Topical Anesthetic Applied: Other: lidocaine Applied: Other: lidocaine 4% 4% Treatment Notes Electronic Signature(s) Signed: 10/25/2016 5:06:12 PM By: Alric Quan Entered By: Alric Quan on 10/25/2016 09:19:41 [name] ([id number]) -------------------------------------------------------------------------------- Multi-Disciplinary Care Plan Details Patient Name: DALYA, MASELLI. Date of Service: 10/25/2016 8:45 AM Medical Record Number: [id number] Patient Account Number: [id number] Date of Birth/Sex: 09/24/1951 (65 y.o. Female) Treating RN: Ahmed Prima Primary Care Physician: FITZGERALD, DAVID Other Clinician: Referring Physician: FITZGERALD,  DAVID Treating Physician/Extender: Frann Rider in Treatment: 46 Active Inactive Orientation to the Wound Care Program Nursing Diagnoses: Knowledge deficit related to the wound healing center program Goals: Patient/caregiver will verbalize understanding of the [facility] Date Initiated: 06/21/2016 Goal Status: Active Interventions: Provide education on orientation to the wound center Notes: Wound/Skin Impairment Nursing Diagnoses: Impaired tissue integrity Goals: Patient/caregiver will verbalize understanding of skin care regimen Date Initiated: 06/21/2016 Goal Status: Active Ulcer/skin breakdown will have a volume reduction of 30% by week 4 Date Initiated: 06/21/2016 Goal Status: Active Ulcer/skin breakdown will have a volume reduction of 50% by week 8 Date Initiated: 06/21/2016 Goal Status: Active Ulcer/skin breakdown will have a volume reduction of 80% by week 12 Date Initiated: 06/21/2016 Goal Status: Active Ulcer/skin breakdown will heal within 14 weeks Date Initiated: 06/21/2016 Goal Status: Active LACHELL, ROCHETTE ([id number]) Interventions: Assess patient/caregiver ability to obtain necessary supplies Assess patient/caregiver ability to perform ulcer/skin care regimen upon admission and as needed Assess ulceration(s) every visit Provide education on ulcer and skin care Notes: Electronic Signature(s) Signed: 10/25/2016 5:06:12 PM By: Alric Quan Entered By: Alric Quan on 10/25/2016 09:19:31 [name] ([id number]) -------------------------------------------------------------------------------- Pain Assessment Details Patient Name: LATEESHA, BEZOLD. Date of Service: 10/25/2016 8:45 AM Medical Record Number: [id number] Patient Account Number: [id number] Date of Birth/Sex: 10/25/1951 (65 y.o. Female) Treating RN: Ahmed Prima Primary Care Physician:  FITZGERALD, DAVID Other Clinician: Referring Physician: FITZGERALD,  DAVID Treating Physician/Extender: Frann Rider in Treatment: 52 Active Problems Location of Pain Severity and Description of Pain Patient Has Paino Yes Site Locations Pain Location: Pain in Ulcers With Dressing Change: Yes Rate the pain. Current Pain Level: 2 Character of Pain Describe the Pain: Throbbing Pain Management and Medication Current Pain Management: Electronic Signature(s) Signed: 10/25/2016 5:06:12 PM By: Alric Quan Entered By: Alric Quan on 10/25/2016 09:09:14 [name] ([id number]) -------------------------------------------------------------------------------- Patient/Caregiver Education Details Patient Name: KARLEY, PHO. Date of Service: 10/25/2016 8:45 AM Medical Record Number: [id number] Patient Account Number: [id number] Date of Birth/Gender: 21-Nov-1951 (65 y.o. Female) Treating RN: Ahmed Prima Primary Care Physician: FITZGERALD, DAVID Other Clinician: Referring Physician: FITZGERALD, DAVID Treating Physician/Extender: Frann Rider in Treatment: 20 Education Assessment Education Provided To: Patient Education Topics Provided Wound/Skin Impairment: Handouts: Other: change dressing as ordered and do not get dressing wet Methods: Demonstration, Explain/Verbal Responses: State content correctly Electronic Signature(s) Signed: 10/25/2016 5:06:12 PM By: Alric Quan Entered By: Alric Quan on 10/25/2016 09:24:49 [name] ([id number]) -------------------------------------------------------------------------------- Wound Assessment Details Patient Name: MAXWELL, MARTORANO. Date of Service: 10/25/2016 8:45 AM Medical Record Number: [id number] Patient Account Number: [id number] Date of Birth/Sex: 04/12/1951 (65 y.o. Female) Treating RN: Ahmed Prima Primary Care Physician: FITZGERALD, DAVID Other Clinician: Referring Physician: FITZGERALD, DAVID Treating Physician/Extender: Frann Rider in Treatment: 18 Wound Status Wound Number: 1 Primary Etiology: Lymphedema Wound Location: Left Lower Leg - Wound Status: Open Circumfernential Wounding Event: Gradually Appeared Date Acquired: 12/31/2015 Weeks Of Treatment: 18 Clustered Wound: No Photos Photo Uploaded By: Alric Quan on 10/25/2016 12:41:35 Wound Measurements Length: (cm) 4.7 Width: (cm) 5 Depth: (cm) 0.2 Area: (cm) 18.457 Volume: (cm) 3.691 % Reduction in Area: 81.6% % Reduction in Volume: 81.6% Epithelialization: Large (67-100%) Undermining: No Wound Description Classification: Partial Thickness Wound Margin: Flat and Intact Exudate Amount: Large Exudate Type: Serosanguineous Exudate Color: red, brown Foul Odor After Cleansing: Yes Due to Product Use: No Wound Bed Granulation Amount: Medium (34-66%) Exposed Structure Granulation Quality: Red Fascia Exposed: No Necrotic Amount: Medium (34-66%) Fat Layer Exposed: No TYLIAH, SCHLERETH ([id number]) Necrotic Quality: Adherent Slough Tendon Exposed: No Muscle Exposed: No Joint Exposed: No Bone Exposed: No Limited to Skin Breakdown Periwound Skin Texture Texture Color No Abnormalities Noted: No No Abnormalities Noted: No Callus: No Atrophie Blanche: No Crepitus: No Cyanosis: No Excoriation: No Ecchymosis: No Fluctuance: No Erythema: No Friable: No Hemosiderin Staining: Yes Induration: No Mottled: No Localized Edema: Yes Pallor: No Rash: No Rubor: No Scarring: Yes Temperature / Pain Moisture Temperature: No Abnormality No Abnormalities Noted: No Tenderness on Palpation: Yes Dry / Scaly: No Maceration: Yes Moist: Yes Wound Preparation Ulcer Cleansing: Other: soap and water, Topical Anesthetic Applied: Other: lidocaine 4%, Treatment Notes Wound #1 (Left, Circumferential Lower Leg) 1. Cleansed with: Clean wound with Normal Saline Cleanse wound with antibacterial soap and water 2. Anesthetic Topical  Lidocaine 4% cream to wound bed prior to debridement 4. Dressing Applied: Aquacel Ag 5. Secondary Dressing Applied ABD Pad 7. Secured with Tape 3 Layer Compression System - Bilateral Notes xtrasorb, unna to [employer]) NASTASIA, KAGE ([id number]) Signed: 10/25/2016 5:06:12 PM By: Alric Quan Entered By: Alric Quan on 10/25/2016 09:19:02 [name] ([id number]) -------------------------------------------------------------------------------- Wound Assessment Details Patient Name: TYLEE, YUM. Date of Service: 10/25/2016 8:45 AM Medical Record Number: [id number] Patient Account Number: [id number] Date of Birth/Sex: [birth year] (65 y.o. Female) Treating RN: Ahmed Prima Primary Care Physician: FITZGERALD,  DAVID Other Clinician: Referring Physician: FITZGERALD, DAVID Treating Physician/Extender: Frann Rider in Treatment: 18 Wound Status Wound Number: 2 Primary Etiology: Lymphedema Wound Location: Right Lower Leg - Wound Status: Open Circumfernential Wounding Event: Gradually Appeared Date Acquired: 12/31/2015 Weeks Of Treatment: 18 Clustered Wound: Yes Photos Photo Uploaded By: Alric Quan on 10/25/2016 12:41:35 Wound Measurements Length: (cm) 25 Width: (cm) 28 Depth: (cm) 0.2 Clustered Quantity: 3 Area: (cm) 549.779 Volume: (cm) 109.956 % Reduction in Area: -143.1% % Reduction in Volume: -143.1% Epithelialization: None Tunneling: No Undermining: No Wound Description Classification: Partial Thickness Wound Margin: Indistinct, nonvisible Exudate Amount: Large Exudate Type: Serosanguineous Exudate Color: red, brown Foul Odor After Cleansing: No Wound Bed Granulation Amount: Medium (34-66%) Exposed Structure Granulation Quality: Red Fascia Exposed: No OVAL, MORALEZ ([id number]) Necrotic Amount: Medium (34-66%) Fat Layer Exposed: No Necrotic Quality: Eschar, Adherent Slough Tendon Exposed:  No Muscle Exposed: No Joint Exposed: No Bone Exposed: No Limited to Skin Breakdown Periwound Skin Texture Texture Color No Abnormalities Noted: No No Abnormalities Noted: No Callus: No Atrophie Blanche: No Crepitus: No Cyanosis: No Excoriation: No Ecchymosis: No Fluctuance: No Erythema: Yes Friable: No Erythema Location: Circumferential Induration: No Hemosiderin Staining: Yes Localized Edema: Yes Mottled: No Rash: No Pallor: No Scarring: Yes Rubor: No Moisture Temperature / Pain No Abnormalities Noted: No Temperature: No Abnormality Dry / Scaly: No Tenderness on Palpation: Yes Maceration: Yes Moist: Yes Wound Preparation Ulcer Cleansing: Other: soap and water, Topical Anesthetic Applied: Other: lidocaine 4%, Treatment Notes Wound #2 (Right, Circumferential Lower Leg) 1. Cleansed with: Clean wound with Normal Saline Cleanse wound with antibacterial soap and water 2. Anesthetic Topical Lidocaine 4% cream to wound bed prior to debridement 4. Dressing Applied: Aquacel Ag 5. Secondary Dressing Applied ABD Pad 7. Secured with Tape 3 Layer Compression System - Bilateral Notes xtrasorb, unna to anchor DEONNE, ROOKS ([id number]) Electronic Signature(s) Signed: 10/25/2016 5:06:12 PM By: Alric Quan Entered By: Alric Quan on 10/25/2016 09:19:23 [name] ([id number]) -------------------------------------------------------------------------------- Vitals Details Patient Name: ELIZABETHANNE, LUSHER. Date of Service: 10/25/2016 8:45 AM Medical Record Number: [id number] Patient Account Number: [id number] Date of Birth/Sex: 07/18/1951 (65 y.o. Female) Treating RN: Ahmed Prima Primary Care Physician: FITZGERALD, DAVID Other Clinician: Referring Physician: FITZGERALD, DAVID Treating Physician/Extender: Frann Rider in Treatment: 18 Vital Signs Time Taken: 09:09 Temperature (F): 98.1 Height (in): 67 Pulse (bpm): 68 Weight  (lbs): 300 Respiratory Rate (breaths/min): 20 Body Mass Index (BMI): 47 Blood Pressure (mmHg): 166/55 Reference Range: 80 - 120 mg / dl Notes Made Dr. Con Memos aware of pt's BP. Electronic Signature(s) Signed: 10/25/2016 5:06:12 PM By: Alric Quan Entered By: Alric Quan on 10/25/2016 09:26:43

## 2016-11-01 ENCOUNTER — Encounter: Payer: 59 | Attending: Surgery | Admitting: Surgery

## 2016-11-01 DIAGNOSIS — E11622 Type 2 diabetes mellitus with other skin ulcer: Secondary | ICD-10-CM | POA: Insufficient documentation

## 2016-11-01 DIAGNOSIS — E1140 Type 2 diabetes mellitus with diabetic neuropathy, unspecified: Secondary | ICD-10-CM | POA: Insufficient documentation

## 2016-11-01 DIAGNOSIS — L97212 Non-pressure chronic ulcer of right calf with fat layer exposed: Secondary | ICD-10-CM | POA: Diagnosis not present

## 2016-11-01 DIAGNOSIS — Z87891 Personal history of nicotine dependence: Secondary | ICD-10-CM | POA: Diagnosis not present

## 2016-11-01 DIAGNOSIS — L97222 Non-pressure chronic ulcer of left calf with fat layer exposed: Secondary | ICD-10-CM | POA: Insufficient documentation

## 2016-11-01 DIAGNOSIS — I890 Lymphedema, not elsewhere classified: Secondary | ICD-10-CM | POA: Insufficient documentation

## 2016-11-01 DIAGNOSIS — S91012S Laceration without foreign body, left ankle, sequela: Secondary | ICD-10-CM | POA: Diagnosis not present

## 2016-11-02 NOTE — Progress Notes (Signed)
JODEE, WAGENAAR ([id number]) Visit Report for 11/01/2016 Chief Complaint Document Details Patient Name: Brooke Hopkins, Brooke Hopkins. Date of Service: 11/01/2016 8:45 AM Medical Record Number: [id number] Patient Account Number: [id number] Date of Birth/Sex: 11-14-1951 (65 y.o. Female) Treating RN: Ahmed Prima Primary Care Physician: FITZGERALD, DAVID Other Clinician: Referring Physician: FITZGERALD, DAVID Treating Physician/Extender: Frann Rider in Treatment: 21 Information Obtained from: Patient Chief Complaint Patients presents for treatment of an open diabetic ulcer and significant lymphedema which she's had for about two years Electronic Signature(s) Signed: 11/01/2016 9:45:24 AM By: Christin Fudge MD, FACS Entered By: Christin Fudge on 11/01/2016 09:45:24 [name] ([id number]) -------------------------------------------------------------------------------- Debridement Details Patient Name: Brooke Hopkins. Date of Service: 11/01/2016 8:45 AM Medical Record Number: [id number] Patient Account Number: [id number] Date of Birth/Sex: January 16, 1951 (65 y.o. Female) Treating RN: Ahmed Prima Primary Care Physician: FITZGERALD, DAVID Other Clinician: Referring Physician: FITZGERALD, DAVID Treating Physician/Extender: Frann Rider in Treatment: 19 Debridement Performed for Wound #1 Left,Circumferential Lower Leg Assessment: Performed By: Physician Christin Fudge, MD Debridement: Debridement Pre-procedure Yes - 09:24 Verification/Time Out Taken: Start Time: 09:27 Pain Control: Lidocaine 4% Topical Solution Level: Skin/Subcutaneous Tissue Total Area Debrided (L x 4.5 (cm) x 1.5 (cm) = 6.75 (cm) W): Tissue and other Viable, Non-Viable, Exudate, Fibrin/Slough, Subcutaneous material debrided: Instrument: Curette Bleeding: Minimum Hemostasis Achieved: Pressure End Time: 09:29 Procedural Pain: 0 Post Procedural Pain: 0 Response to Treatment: Procedure was  tolerated well Post Debridement Measurements of Total Wound Length: (cm) 4.5 Width: (cm) 1.5 Depth: (cm) 0.2 Volume: (cm) 1.06 Character of Wound/Ulcer Post Requires Further Debridement Debridement: Severity of Tissue Post Debridement: Fat layer exposed Post Procedure Diagnosis Same as Pre-procedure Electronic Signature(s) Signed: 11/01/2016 9:45:10 AM By: Christin Fudge MD, FACS Signed: 11/01/2016 5:03:25 PM By: Alric Quan Entered By: Christin Fudge on 11/01/2016 09:45:10 [name] ([name], [name] ([id number]) -------------------------------------------------------------------------------- Debridement Details Patient Name: Brooke Hopkins. Date of Service: 11/01/2016 8:45 AM Medical Record Number: [id number] Patient Account Number: [id number] Date of Birth/Sex: [birth year] (65 y.o. Female) Treating RN: Ahmed Prima Primary Care Physician: FITZGERALD, DAVID Other Clinician: Referring Physician: FITZGERALD, DAVID Treating Physician/Extender: Frann Rider in Treatment: 19 Debridement Performed for Wound #2 Right,Circumferential Lower Leg Assessment: Performed By: Physician Christin Fudge, MD Debridement: Debridement Pre-procedure Yes - 09:24 Verification/Time Out Taken: Start Time: 09:25 Pain Control: Lidocaine 4% Topical Solution Level: Skin/Subcutaneous Tissue Total Area Debrided (L x 2 (cm) x 3 (cm) = 6 (cm) W): Tissue and other Viable, Non-Viable, Exudate, Fibrin/Slough, Subcutaneous material debrided: Instrument: Curette Bleeding: Minimum Hemostasis Achieved: Pressure End Time: 09:26 Procedural Pain: 0 Post Procedural Pain: 0 Response to Treatment: Procedure was tolerated well Post Debridement Measurements of Total Wound Length: (cm) 2 Width: (cm) 3 Depth: (cm) 0.2 Volume: (cm) 0.942 Character of Wound/Ulcer Post Requires Further Debridement Debridement: Severity of Tissue Post Debridement: Fat layer exposed Post  Procedure Diagnosis Same as Pre-procedure Electronic Signature(s) Signed: 11/01/2016 9:45:18 AM By: Christin Fudge MD, FACS Signed: 11/01/2016 5:03:25 PM By: Alric Quan Entered By: Christin Fudge on 11/01/2016 09:45:17 [name] ([id number]) [name], [name] ([id number]) -------------------------------------------------------------------------------- HPI Details Patient Name: Brooke Hopkins. Date of Service: 11/01/2016 8:45 AM Medical Record Number: [id number] Patient Account Number: [id number] Date of Birth/Sex: May 16, 1951 (65 y.o. Female) Treating RN: Ahmed Prima Primary Care Physician: FITZGERALD, DAVID Other Clinician: Referring Physician: FITZGERALD, DAVID Treating Physician/Extender: Frann Rider in Treatment: 19 History of Present Illness Location: massive swelling of both lower extremities and ulceration both lower extremities Quality: Patient reports experiencing  a dull pain to affected area(s). Severity: Patient states wound are getting worse. Duration: Patient has had the wound for >2 years prior to seeking treatment at the wound center Timing: Pain in wound is constant (hurts all the time) Context: The wound appeared gradually over time Modifying Factors: Other treatment(s) tried include:she has a lymphedema pump but uses it seldom and she's had several course of antibiotics Associated Signs and Symptoms: Patient reports having difficulty standing for long periods. HPI Description: 65 year old patient seen by Dr. Ola Spurr of infectious disease who has been following up for left lower extremity cellulitis and ulcer with recurrent bilateral lower extremity problems for several months. Recently she had a large right lower extremity bullae which opened out and has been ulcerated. She has seen the vascular group and has been getting Unna's wraps and has a lymphedema pump used in the past. Her prior cultures were positive for Pseudomonas, Proteus and  was treated with amoxicillin. He has also been treated with 2 weeks course of ciprofloxacin and amoxicillin.. Increase of Lasix dose helped with the edema and echo showed no systolic CHF but may be diastolic problems. Past medical history significant for diabetes mellitus type 2, venous stasis ulcer, obesity, diabetic peripheral neuropathy, status post back surgery, cholecystectomy, hysterectomy, arthroscopy of the knee. He is a former smoker and quit smoking in 1984. The patient has seen Dr. Delana Meyer who did not recommend any arterial or venous duplex studies and has been using Unna's wraps and also recommended a lymphedema pump. she has been very noncompliant with using these. 06/28/2016 -- the patient is still on antibiotics as prescribed by Dr. Ola Spurr and he is asked her to take it for 3 weeks. The patient also says she has a lot of redness and pain in the folds of her thigh and lower extremity and this is very painful. 07/26/2016 -- the patient is off antibiotics and has been getting dressing changes 3 times a week. 08/09/2016 -- is been on Cipro and is taking potassium supplements along with her Lasix and is going to be seeing Dr. Ola Spurr for a consult return visit only in November 2017. 08/23/2016 -- she was admitted to the hospital last week and I have reviewed these reports in detail. She was seen by Dr. Ola Spurr who noted a Pseudomonas infection which was resistant to ciprofloxacin and discharged her on Ceftazidime 1 g IV every 12 hourly anyone days. The antibiotic was to be stopped on September 11 and he would see her back in the clinic. 08/30/2016 -- had a communication from Dr. Ola Spurr that he would extend her antibiotics by a week if she continued to look like cellulitis was persisting. TABATHIA, KNOCHE ([id number]) 09/13/2016 -- the PICC line is out and antibiotics have stopped. 10/04/16: returns today for f/u. reports utilizes compression pump twice daily. she is  compliant with her compression therapy. she reports a new blister on the right proximal posterior calf. no systemic s/s of infection. 10/11/16: returns today for f/u. she reports adherence to her compression pumps, but there is no improvement regarding her BLE edema. there is weeping of fluid. denies fever, chills, body aches or malaise. 10/18/2016 -- she has a significant cellulitis of her right lower extremity which was not there last week. I believe at this stage she will need the expertise of Dr. Ola Spurr to decide on antibiotic coverage. 10/25/2016 -- the patient has had cipro and ampicillin been started by Dr. Ola Spurr and he is awaiting further cultures. Overall she's been feeling  better. Electronic Signature(s) Signed: 11/01/2016 9:45:32 AM By: Christin Fudge MD, FACS Entered By: Christin Fudge on 11/01/2016 09:45:31 [name] ([id number]) -------------------------------------------------------------------------------- Physical Exam Details Patient Name: JHANA, GIARRATANO. Date of Service: 11/01/2016 8:45 AM Medical Record Number: [id number] Patient Account Number: [id number] Date of Birth/Sex: 01/30/1951 (65 y.o. Female) Treating RN: Ahmed Prima Primary Care Physician: FITZGERALD, DAVID Other Clinician: Referring Physician: FITZGERALD, DAVID Treating Physician/Extender: Frann Rider in Treatment: 19 Constitutional . Pulse regular. Respirations normal and unlabored. Afebrile. . Eyes Nonicteric. Reactive to light. Ears, Nose, Mouth, and Throat Lips, teeth, and gums WNL.Marland Kitchen Moist mucosa without lesions. Neck supple and nontender. No palpable supraclavicular or cervical adenopathy. Normal sized without goiter. Respiratory WNL. No retractions.. Breath sounds WNL, No rubs, rales, rhonchi, or wheeze.. Cardiovascular Heart rhythm and rate regular, no murmur or gallop.. Pedal Pulses WNL. No clubbing, cyanosis or edema. Lymphatic No adneopathy. No adenopathy. No  adenopathy. Musculoskeletal Adexa without tenderness or enlargement.. Digits and nails w/o clubbing, cyanosis, infection, petechiae, ischemia, or inflammatory conditions.. Integumentary (Hair, Skin) No suspicious lesions. No crepitus or fluctuance. No peri-wound warmth or erythema. No masses.Marland Kitchen Psychiatric Judgement and insight Intact.. No evidence of depression, anxiety, or agitation.. Notes RIMA persist, no evidence of cellulitis and the ulcers on the posterior calves need sharp debridement with a #3 curet and minimal bleeding controlled with pressure. Electronic Signature(s) Signed: 11/01/2016 9:46:00 AM By: Christin Fudge MD, FACS Entered By: Christin Fudge on 11/01/2016 09:45:59 [name] ([id number]) -------------------------------------------------------------------------------- Physician Orders Details Patient Name: SUN, KIHN. Date of Service: 11/01/2016 8:45 AM Medical Record Number: [id number] Patient Account Number: [id number] Date of Birth/Sex: 09/11/1951 (65 y.o. Female) Treating RN: Ahmed Prima Primary Care Physician: FITZGERALD, DAVID Other Clinician: Referring Physician: FITZGERALD, DAVID Treating Physician/Extender: Frann Rider in Treatment: 30 Verbal / Phone Orders: Yes Clinician: Carolyne Fiscal, Debi Read Back and Verified: Yes Diagnosis Coding Wound Cleansing Wound #1 Left,Circumferential Lower Leg o Cleanse wound with mild soap and water o May Shower, gently pat wound dry prior to applying new dressing. o May shower with protection. Wound #2 Right,Circumferential Lower Leg o Cleanse wound with mild soap and water o May Shower, gently pat wound dry prior to applying new dressing. o May shower with protection. Anesthetic Wound #1 Left,Circumferential Lower Leg o Topical Lidocaine 4% cream applied to wound bed prior to debridement Wound #2 Right,Circumferential Lower Leg o Topical Lidocaine 4% cream applied to wound bed  prior to debridement Skin Barriers/Peri-Wound Care Wound #1 Left,Circumferential Lower Leg o Other: - Lotrisone cream in folds at knees and on reddened areas on legs (Nystatin used in clinic) Wound #2 Right,Circumferential Lower Leg o Other: - Lotrisone cream in folds at knees and on reddened areas on legs (Nystatin used in clinic) Primary Wound Dressing Wound #1 Left,Circumferential Lower Leg o Santyl Ointment Wound #2 Right,Circumferential Lower Leg o Santyl Ointment Secondary Dressing Wound #1 Left,Circumferential Lower Leg o ABD pad o VERMELL, MADRID. ([id number]) Wound #2 Right,Circumferential Lower Leg o ABD pad o XtraSorb Dressing Change Frequency Wound #1 Left,Circumferential Lower Leg o Change Dressing Monday, Wednesday, Friday - HHRN to change wraps Monday and Wednesay Wound #2 Right,Circumferential Lower Leg o Change Dressing Monday, Wednesday, Friday - HHRN to change wraps Monday and Wednesay Follow-up Appointments Wound #1 Left,Circumferential Lower Leg o Return Appointment in 1 week. Wound #2 Right,Circumferential Lower Leg o Return Appointment in 1 week. Edema Control Wound #1 Left,Circumferential Lower Leg o 3 Layer Compression System - Bilateral - May anchor top of  wrap with dome paste, but only wrap around leg one time. o Elevate legs to the level of the heart and pump ankles as often as possible Wound #2 Right,Circumferential Lower Leg o 3 Layer Compression System - Bilateral - May anchor top of wrap with dome paste, but only wrap around leg one time. o Elevate legs to the level of the heart and pump ankles as often as possible Home Health Wound #1 Left,Circumferential Lower Leg o [place] [facility] - [place] Nurse may visit PRN to address patientos wound care needs. o FACE TO FACE ENCOUNTER: MEDICARE and MEDICAID PATIENTS: I certify that this patient is under my care and that I had  a face-to-face encounter that meets the physician face-to-face encounter requirements with this patient on this date. The encounter with the patient was in whole or in part for the following MEDICAL CONDITION: (primary reason for [place]) MEDICAL NECESSITY: I certify, that based on my findings, NURSING services are a medically necessary home health service. HOME BOUND STATUS: I certify that my clinical findings support that this patient is homebound (i.e., Due to illness or injury, pt requires aid of supportive devices such as crutches, cane, wheelchairs, walkers, the use of special transportation or the assistance of another person to leave their place of residence. There is a normal inability to leave the home and doing so requires considerable and taxing effort. Other absences are for medical reasons / religious services and are infrequent or of short duration when for other reasons). o Please direct any NON-WOUND related issues/requests for orders to patient's Primary Care Physician LACHRISTA, HESLIN ([id number]) Wound #2 Right,Circumferential Lower Leg o [place] [facility] - [place] Nurse may visit PRN to address patientos wound care needs. o FACE TO FACE ENCOUNTER: MEDICARE and MEDICAID PATIENTS: I certify that this patient is under my care and that I had a face-to-face encounter that meets the physician face-to-face encounter requirements with this patient on this date. The encounter with the patient was in whole or in part for the following MEDICAL CONDITION: (primary reason for [place]) MEDICAL NECESSITY: I certify, that based on my findings, NURSING services are a medically necessary home health service. HOME BOUND STATUS: I certify that my clinical findings support that this patient is homebound (i.e., Due to illness or injury, pt requires aid of supportive devices such as crutches, cane, wheelchairs, walkers, the use of  special transportation or the assistance of another person to leave their place of residence. There is a normal inability to leave the home and doing so requires considerable and taxing effort. Other absences are for medical reasons / religious services and are infrequent or of short duration when for other reasons). o Please direct any NON-WOUND related issues/requests for orders to patient's Primary Care Physician Electronic Signature(s) Signed: 11/01/2016 4:03:12 PM By: Christin Fudge MD, FACS Signed: 11/01/2016 5:03:25 PM By: Alric Quan Entered By: Alric Quan on 11/01/2016 09:28:02 [name] ([id number]) -------------------------------------------------------------------------------- Problem List Details Patient Name: SHAGUN, WORDELL. Date of Service: 11/01/2016 8:45 AM Medical Record Number: [id number] Patient Account Number: [id number] Date of Birth/Sex: [birth year] (65 y.o. Female) Treating RN: Ahmed Prima Primary Care Physician: FITZGERALD, DAVID Other Clinician: Referring Physician: FITZGERALD, DAVID Treating Physician/Extender: Frann Rider in Treatment: 70 Active Problems ICD-10 Encounter Code Description Active Date Diagnosis E11.622 Type 2 diabetes mellitus with other skin ulcer 10/25/2016 Yes I89.0 Lymphedema, not elsewhere classified 10/25/2016 Yes L97.222 Non-pressure chronic ulcer of left  calf with fat layer 10/25/2016 Yes exposed L97.212 Non-pressure chronic ulcer of right calf with fat layer 10/25/2016 Yes exposed E66.01 Morbid (severe) obesity due to excess calories 10/25/2016 Yes Inactive Problems Resolved Problems Electronic Signature(s) Signed: 11/01/2016 9:45:02 AM By: Christin Fudge MD, FACS Entered By: Christin Fudge on 11/01/2016 09:45:02 [name] ([id number]) -------------------------------------------------------------------------------- Progress Note Details Patient Name: STEVE, GREGG. Date of  Service: 11/01/2016 8:45 AM Medical Record Number: [id number] Patient Account Number: [id number] Date of Birth/Sex: March 08, 1951 (65 y.o. Female) Treating RN: Ahmed Prima Primary Care Physician: FITZGERALD, DAVID Other Clinician: Referring Physician: FITZGERALD, DAVID Treating Physician/Extender: Frann Rider in Treatment: 62 Subjective Chief Complaint Information obtained from Patient Patients presents for treatment of an open diabetic ulcer and significant lymphedema which she's had for about two years History of Present Illness (HPI) The following HPI elements were documented for the patient's wound: Location: massive swelling of both lower extremities and ulceration both lower extremities Quality: Patient reports experiencing a dull pain to affected area(s). Severity: Patient states wound are getting worse. Duration: Patient has had the wound for >2 years prior to seeking treatment at the wound center Timing: Pain in wound is constant (hurts all the time) Context: The wound appeared gradually over time Modifying Factors: Other treatment(s) tried include:she has a lymphedema pump but uses it seldom and she's had several course of antibiotics Associated Signs and Symptoms: Patient reports having difficulty standing for long periods. 65 year old patient seen by Dr. Ola Spurr of infectious disease who has been following up for left lower extremity cellulitis and ulcer with recurrent bilateral lower extremity problems for several months. Recently she had a large right lower extremity bullae which opened out and has been ulcerated. She has seen the vascular group and has been getting Unna's wraps and has a lymphedema pump used in the past. Her prior cultures were positive for Pseudomonas, Proteus and was treated with amoxicillin. He has also been treated with 2 weeks course of ciprofloxacin and amoxicillin.. Increase of Lasix dose helped with the edema and echo showed no systolic  CHF but may be diastolic problems. Past medical history significant for diabetes mellitus type 2, venous stasis ulcer, obesity, diabetic peripheral neuropathy, status post back surgery, cholecystectomy, hysterectomy, arthroscopy of the knee. He is a former smoker and quit smoking in 1984. The patient has seen Dr. Delana Meyer who did not recommend any arterial or venous duplex studies and has been using Unna's wraps and also recommended a lymphedema pump. she has been very noncompliant with using these. 06/28/2016 -- the patient is still on antibiotics as prescribed by Dr. Ola Spurr and he is asked her to take it for 3 weeks. The patient also says she has a lot of redness and pain in the folds of her thigh and lower extremity and this is very painful. 07/26/2016 -- the patient is off antibiotics and has been getting dressing changes 3 times a week. 08/09/2016 -- is been on Cipro and is taking potassium supplements along with her Lasix and is going to be BRIANNE, MAINA. ([id number]) seeing Dr. Ola Spurr for a consult return visit only in November 2017. 08/23/2016 -- she was admitted to the hospital last week and I have reviewed these reports in detail. She was seen by Dr. Ola Spurr who noted a Pseudomonas infection which was resistant to ciprofloxacin and discharged her on Ceftazidime 1 g IV every 12 hourly anyone days. The antibiotic was to be stopped on September 11 and he would see her back in the clinic.  08/30/2016 -- had a communication from Dr. Ola Spurr that he would extend her antibiotics by a week if she continued to look like cellulitis was persisting. 09/13/2016 -- the PICC line is out and antibiotics have stopped. 10/04/16: returns today for f/u. reports utilizes compression pump twice daily. she is compliant with her compression therapy. she reports a new blister on the right proximal posterior calf. no systemic s/s of infection. 10/11/16: returns today for f/u. she reports  adherence to her compression pumps, but there is no improvement regarding her BLE edema. there is weeping of fluid. denies fever, chills, body aches or malaise. 10/18/2016 -- she has a significant cellulitis of her right lower extremity which was not there last week. I believe at this stage she will need the expertise of Dr. Ola Spurr to decide on antibiotic coverage. 10/25/2016 -- the patient has had cipro and ampicillin been started by Dr. Ola Spurr and he is awaiting further cultures. Overall she's been feeling better. Objective Constitutional Pulse regular. Respirations normal and unlabored. Afebrile. Vitals Time Taken: 9:07 AM, Height: 67 in, Weight: 300 lbs, BMI: 47, Temperature: 97.9 F, Pulse: 81 bpm, Respiratory Rate: 20 breaths/min, Blood Pressure: 176/72 mmHg. Eyes Nonicteric. Reactive to light. Ears, Nose, Mouth, and Throat Lips, teeth, and gums WNL.Marland Kitchen Moist mucosa without lesions. Neck supple and nontender. No palpable supraclavicular or cervical adenopathy. Normal sized without goiter. Respiratory WNL. No retractions.. Breath sounds WNL, No rubs, rales, rhonchi, or wheeze.. Cardiovascular PRIYANKA, CAUSEY. ([id number]) Heart rhythm and rate regular, no murmur or gallop.. Pedal Pulses WNL. No clubbing, cyanosis or edema. Lymphatic No adneopathy. No adenopathy. No adenopathy. Musculoskeletal Adexa without tenderness or enlargement.. Digits and nails w/o clubbing, cyanosis, infection, petechiae, ischemia, or inflammatory conditions.Marland Kitchen Psychiatric Judgement and insight Intact.. No evidence of depression, anxiety, or agitation.. General Notes: RIMA persist, no evidence of cellulitis and the ulcers on the posterior calves need sharp debridement with a #3 curet and minimal bleeding controlled with pressure. Integumentary (Hair, Skin) No suspicious lesions. No crepitus or fluctuance. No peri-wound warmth or erythema. No masses.. Wound #1 status is Open. Original cause of  wound was Gradually Appeared. The wound is located on the Left,Circumferential Lower Leg. The wound measures 4.5cm length x 1.5cm width x 0.2cm depth; 5.301cm^2 area and 1.06cm^3 volume. The wound is limited to skin breakdown. There is no tunneling or undermining noted. There is a large amount of purulent drainage noted. The wound margin is flat and intact. There is medium (34-66%) red granulation within the wound bed. There is a medium (34-66%) amount of necrotic tissue within the wound bed including Adherent Slough. The periwound skin appearance exhibited: Localized Edema, Scarring, Maceration, Moist, Hemosiderin Staining. The periwound skin appearance did not exhibit: Callus, Crepitus, Excoriation, Fluctuance, Friable, Induration, Rash, Dry/Scaly, Atrophie Blanche, Cyanosis, Ecchymosis, Mottled, Pallor, Rubor, Erythema. Periwound temperature was noted as No Abnormality. The periwound has tenderness on palpation. Wound #2 status is Open. Original cause of wound was Gradually Appeared. The wound is located on the Right,Circumferential Lower Leg. The wound measures 2cm length x 11.5cm width x 0.2cm depth; 18.064cm^2 area and 3.613cm^3 volume. The wound is limited to skin breakdown. There is no tunneling or undermining noted. There is a large amount of purulent drainage noted. The wound margin is indistinct and nonvisible. There is medium (34-66%) red granulation within the wound bed. There is a medium (34-66%) amount of necrotic tissue within the wound bed including Eschar and Adherent Slough. The periwound skin appearance exhibited: Localized Edema, Scarring, Maceration, Moist, Hemosiderin  Staining, Erythema. The periwound skin appearance did not exhibit: Callus, Crepitus, Excoriation, Fluctuance, Friable, Induration, Rash, Dry/Scaly, Atrophie Blanche, Cyanosis, Ecchymosis, Mottled, Pallor, Rubor. The surrounding wound skin color is noted with erythema which is circumferential. Periwound  temperature was noted as No Abnormality. The periwound has tenderness on palpation. 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 There was a Skin/Subcutaneous Tissue Debridement ([id number]) debridement with total area of 6.75 sq cm performed by Christin Fudge, MD. with the following instrument(s): Curette to remove Viable and Non-Viable tissue/material including Exudate, Fibrin/Slough, and Subcutaneous after achieving pain control using Lidocaine 4% Topical Solution. A time out was conducted at 09:24, prior to the start of the procedure. A Minimum amount of bleeding was controlled with Pressure. The procedure was tolerated well with a pain level of 0 throughout and a pain level of 0 following the procedure. Post Debridement Measurements: 4.5cm length x 1.5cm width x 0.2cm depth; 1.06cm^3 volume. Character of Wound/Ulcer Post Debridement requires further debridement. Severity of Tissue Post Debridement is: Fat layer exposed. Post procedure Diagnosis Wound #1: Same as Pre-Procedure Wound #2 Wound #2 is a Lymphedema located on the Right,Circumferential Lower Leg . There was a Skin/Subcutaneous Tissue Debridement ([id number]) debridement with total area of 6 sq cm performed by Christin Fudge, MD. with the following instrument(s): Curette to remove Viable and Non-Viable tissue/material including Exudate, Fibrin/Slough, and Subcutaneous after achieving pain control using Lidocaine 4% Topical Solution. A time out was conducted at 09:24, prior to the start of the procedure.  A Minimum amount of bleeding was controlled with Pressure. The procedure was tolerated well with a pain level of 0 throughout and a pain level of 0 following the procedure. Post Debridement Measurements: 2cm length x 3cm width x 0.2cm depth; 0.942cm^3 volume. Character of Wound/Ulcer Post Debridement requires further debridement. Severity of Tissue Post Debridement is: Fat layer exposed. Post procedure Diagnosis Wound #2: Same as Pre-Procedure Plan Wound Cleansing: Wound #1 Left,Circumferential Lower Leg: Cleanse wound with mild soap and water RIGBY, SWAMY ([id number]) May Shower, gently pat wound dry prior to applying new dressing. May shower with protection. Wound #2 Right,Circumferential Lower Leg: Cleanse wound with mild soap and water May Shower, gently pat wound dry prior to applying new dressing. May shower with protection. Anesthetic: Wound #1 Left,Circumferential Lower Leg: Topical Lidocaine 4% cream applied to wound bed prior to debridement Wound #2 Right,Circumferential Lower Leg: Topical Lidocaine 4% cream applied to wound bed prior to debridement Skin Barriers/Peri-Wound Care: Wound #1 Left,Circumferential Lower Leg: Other: - Lotrisone cream in folds at knees and on reddened areas on legs (Nystatin used in clinic) Wound #2 Right,Circumferential Lower Leg: Other: - Lotrisone cream in folds at knees and on reddened areas on legs (Nystatin used in clinic) Primary Wound Dressing: Wound #1 Left,Circumferential Lower Leg: Santyl Ointment Wound #2 Right,Circumferential Lower Leg: Santyl Ointment Secondary Dressing: Wound #1 Left,Circumferential Lower Leg: ABD pad XtraSorb Wound #2 Right,Circumferential Lower Leg: ABD pad XtraSorb Dressing Change Frequency: Wound #1 Left,Circumferential Lower Leg: Change Dressing Monday, Wednesday, Friday - HHRN to change wraps Monday and Wednesay Wound #2 Right,Circumferential Lower Leg: Change Dressing Monday, Wednesday,  Friday - HHRN to change wraps Monday and Wednesay Follow-up Appointments: Wound #1 Left,Circumferential Lower Leg: Return Appointment in 1 week. Wound #2 Right,Circumferential Lower Leg: Return Appointment in 1 week. Edema Control: Wound #1 Left,Circumferential Lower Leg: 3 Layer Compression System - Bilateral - May anchor top of wrap with dome paste, but only wrap around leg one time. Elevate legs to the level of the heart and pump ankles as often as possible Wound #2 Right,Circumferential Lower Leg: 3 Layer Compression System - Bilateral - May anchor top of wrap with dome paste, but only wrap around leg one time. Elevate legs to the level of the heart and pump ankles as often as possible Home Health: Wound #1 Left,Circumferential Lower Leg: Continue Home Health Visits - [place] Nurse may visit PRN to address patient s wound care needs. TORIANNE, LAFLAM. ([id number]) FACE TO FACE ENCOUNTER: MEDICARE and MEDICAID PATIENTS: I certify that this patient is under my care and that I had a face-to-face encounter that meets the physician face-to-face encounter requirements with this patient on this date. The encounter with the patient was in whole or in part for the following MEDICAL CONDITION: (primary reason for [place]) MEDICAL NECESSITY: I certify, that based on my findings, NURSING services are a medically necessary home health service. HOME BOUND STATUS: I certify that my clinical findings support that this patient is homebound (i.e., Due to illness or injury, pt requires aid of supportive devices such as crutches, cane, wheelchairs, walkers, the use of special transportation or the assistance of another person to leave their place of residence. There is a normal inability to leave the home and doing so requires considerable and taxing effort. Other absences are for medical reasons / religious services and are infrequent or of short duration when for other  reasons). Please direct any NON-WOUND related issues/requests for orders to patient's Primary Care Physician Wound #2 Right,Circumferential Lower Leg: [place] Visits - [place] Nurse may visit PRN to address patient s wound care needs. FACE TO FACE ENCOUNTER: MEDICARE and MEDICAID PATIENTS: I certify that this patient is under my care and that I had a face-to-face encounter that meets the physician face-to-face encounter requirements with this patient on this date. The encounter with the patient was in whole or in part for the following MEDICAL CONDITION: (primary reason for [place]) MEDICAL NECESSITY: I certify, that based on my findings, NURSING services are a medically necessary home health service. HOME BOUND STATUS: I certify that my clinical findings support that this patient is homebound (i.e., Due to illness or injury, pt requires aid of supportive devices such as crutches, cane, wheelchairs, walkers, the use of special transportation or the assistance of another person to leave their place of residence. There is a normal inability to leave the home and doing so requires considerable and taxing effort. Other absences are for medical reasons / religious services and are infrequent or of short duration when for other reasons). Please direct any NON-WOUND related issues/requests for orders to patient's Primary Care Physician I have recommended Santyl, draw text and a 3 layer Profore wrap and given her advice to continue with the antibiotics as prescribed by Dr. Ola Spurr. Electronic Signature(s) Signed: 11/01/2016 9:46:48 AM By: Christin Fudge MD, FACS Entered By: Christin Fudge on 11/01/2016 09:46:48 [name] ([id number]) -------------------------------------------------------------------------------- SuperBill Details Patient Name: VELERIA, BARNHARDT. Date of Service: 11/01/2016 Medical Record Number: [id number] Patient Account Number:  [id number] Date of Birth/Sex: 04/29/1951 (65 y.o. 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 E66.01 Morbid (severe) obesity due to excess calories Facility Procedures CPT4 Code: [id number] Description: 24097 - DEB SUBQ TISSUE 20 SQ CM/< ICD-10 Description Diagnosis E11.622 Type 2 diabetes mellitus with other skin ulcer I89.0 Lymphedema, not elsewhere classified L97.212 Non-pressure chronic ulcer of right calf with fat L97.222 Non-pressure  chronic ulcer of left calf with fat l Modifier: layer exposed ayer exposed Quantity: 1 Physician Procedures CPT4 Code: [id number] Description: 42683 - WC PHYS SUBQ TISS 20 SQ CM ICD-10 Description Diagnosis E11.622 Type 2 diabetes mellitus with other skin ulcer I89.0 Lymphedema, not elsewhere classified L97.212 Non-pressure chronic ulcer of right calf with fat L97.222 Non-pressure  chronic ulcer of left calf with fat l Modifier: layer exposed ayer exposed Quantity: 1 Electronic Signature(s) Signed: 11/01/2016 9:47:07 AM By: Christin Fudge MD, FACS Entered By: Christin Fudge on 11/01/2016 09:47:07

## 2016-11-02 NOTE — Progress Notes (Signed)
Brooke Hopkins ([id number]) Visit Report for 11/01/2016 Arrival Information Details Patient Name: Brooke Hopkins, Brooke Hopkins. Date of Service: 11/01/2016 8:45 AM Medical Record Number: [id number] Patient Account Number: [id number] Date of Birth/Sex: Aug 03, 1951 (65 y.o. Female) Treating RN: Ahmed Prima Primary Care Physician: FITZGERALD, DAVID Other Clinician: Referring Physician: FITZGERALD, DAVID Treating Physician/Extender: Frann Rider in Treatment: 22 Visit Information History Since Last Visit All ordered tests and consults were completed: No Patient Arrived: Wheel Chair Added or deleted any medications: No Arrival Time: 09:02 Any new allergies or adverse reactions: No Accompanied By: son Had a fall or experienced change in No Transfer Assistance: [employer] activities of daily living that may affect Patient Lift risk of falls: Patient Identification Verified: Yes Signs or symptoms of abuse/neglect since last No Secondary Verification Process Yes visito Completed: Hospitalized since last visit: No Patient Requires Transmission- No Pain Present Now: No Based Precautions: Patient Has Alerts: No Electronic Signature(s) Signed: 11/01/2016 5:03:25 PM By: Alric Quan Entered By: Alric Quan on 11/01/2016 09:07:18 Brooke Hopkins ([id number]) -------------------------------------------------------------------------------- Encounter Discharge Information Details Patient Name: Brooke Hopkins. Date of Service: 11/01/2016 8:45 AM Medical Record Number: [id number] Patient Account Number: [id number] Date of Birth/Sex: 12/07/1951 (65 y.o. Female) Treating RN: Ahmed Prima Primary Care Physician: FITZGERALD, DAVID Other Clinician: Referring Physician: FITZGERALD, DAVID Treating Physician/Extender: Frann Rider in Treatment: 67 Encounter Discharge Information Items Discharge Pain Level: 0 Discharge Condition: Stable Ambulatory Status:  Wheelchair Discharge Destination: Home Transportation: Private Auto Accompanied By: son Schedule Follow-up Appointment: Yes Medication Reconciliation completed and provided to Patient/Care Yes Saylah Ketner: Provided on Clinical Summary of Care: 11/01/2016 Form Type Recipient Paper Patient LB Electronic Signature(s) Signed: 11/01/2016 9:54:23 AM By: Ruthine Dose Entered By: Ruthine Dose on 11/01/2016 09:54:23 Brooke Hopkins ([id number]) -------------------------------------------------------------------------------- Lower Extremity Assessment Details Patient Name: Hopkins, [name]. Date of Service: 11/01/2016 8:45 AM Medical Record Number: [id number] Patient Account Number: [id number] Date of Birth/Sex: [birth year] (65 y.o. Female) Treating RN: Ahmed Prima Primary Care Physician: FITZGERALD, DAVID Other Clinician: Referring Physician: FITZGERALD, DAVID Treating Physician/Extender: Frann Rider in Treatment: 19 Edema Assessment Assessed: [Left: No] [Right: No] E[Left: dema] [Right: :] Calf Left: Right: Point of Measurement: 32 cm From Medial Instep 53.8 cm 50.5 cm Ankle Left: Right: Point of Measurement: 11 cm From Medial Instep 33.6 cm 31.6 cm Vascular Assessment Pulses: Posterior Tibial Dorsalis Pedis Palpable: [Left:Yes] [Right:Yes] Extremity colors, hair growth, and conditions: Extremity Color: [Left:Red] [Right:Red] Temperature of Extremity: [Left:Warm] [Right:Warm] Capillary Refill: [Left:< 3 seconds] [Right:< 3 seconds] Toe Nail Assessment Left: Right: Thick: Yes Yes Discolored: Yes Yes Deformed: No No Improper Length and Hygiene: Yes Yes Electronic Signature(s) Signed: 11/01/2016 5:03:25 PM By: Alric Quan Entered By: Alric Quan on 11/01/2016 09:12:50 Brooke Hopkins ([id number]) -------------------------------------------------------------------------------- Multi Wound Chart Details Patient Name: Brooke Hopkins. Date of  Service: 11/01/2016 8:45 AM Medical Record Number: [id number] Patient Account Number: [id number] Date of Birth/Sex: [birth year] (65 y.o. Female) Treating RN: Ahmed Prima Primary Care Physician: FITZGERALD, DAVID Other Clinician: Referring Physician: FITZGERALD, DAVID Treating Physician/Extender: Frann Rider in Treatment: 19 Vital Signs Height(in): 67 Pulse(bpm): 81 Weight(lbs): 300 Blood Pressure 176/72 (mmHg): Body Mass Index(BMI): 47 Temperature(F): 97.9 Respiratory Rate 20 (breaths/min): Photos: [1:No Photos] [2:No Photos] [N/A:N/A] Wound Location: [1:Left Lower Leg - Circumfernential] [2:Right Lower Leg - Circumfernential] [N/A:N/A] Wounding Event: [1:Gradually Appeared] [2:Gradually Appeared] [N/A:N/A] Primary Etiology: [1:Lymphedema] [2:Lymphedema] [N/A:N/A] Date Acquired: [1:12/31/2015] [2:12/31/2015] [N/A:N/A] Weeks of Treatment: [1:19] [2:19] [N/A:N/A] Wound Status: [1:Open] [2:Open] [N/A:N/A] Clustered Wound: [1:No] [2:Yes] [N/A:N/A] Clustered Quantity: [  1:N/A] [2:3] [N/A:N/A] Measurements L x W x D 4.5x1.5x0.2 [2:2x11.5x0.2] [N/A:N/A] (cm) Area (cm) : [1:5.301] [2:18.064] [N/A:N/A] Volume (cm) : [1:1.06] [5:9.563] [N/A:N/A] % Reduction in Area: [1:94.70%] [2:92.00%] [N/A:N/A] % Reduction in Volume: 94.70% [2:92.00%] [N/A:N/A] Classification: [1:Partial Thickness] [2:Partial Thickness] [N/A:N/A] Exudate Amount: [1:Large] [2:Large] [N/A:N/A] Exudate Type: [1:Purulent] [2:Purulent] [N/A:N/A] Exudate Color: [1:yellow, brown, green] [2:yellow, brown, green] [N/A:N/A] Foul Odor After [1:Yes] [2:Yes] [N/A:N/A] Cleansing: Odor Anticipated Due to No [2:No] [N/A:N/A] Product Use: Wound Margin: [1:Flat and Intact] [2:Indistinct, nonvisible] [N/A:N/A] Granulation Amount: [1:Medium (34-66%)] [2:Medium (34-66%)] [N/A:N/A] Granulation Quality: [1:Red] [2:Red] [N/A:N/A] Necrotic Amount: [1:Medium (34-66%)] [2:Medium (34-66%)] [N/A:N/A] Necrotic Tissue: [1:Adherent  Slough] [2:Eschar, Adherent Slough] [N/A:N/A] Exposed Structures: Fascia: No Fascia: No N/A Fat: No Fat: No Tendon: No Tendon: No Muscle: No Muscle: No Joint: No Joint: No Bone: No Bone: No Limited to Skin Limited to Skin Breakdown Breakdown Epithelialization: Large (67-100%) None N/A Periwound Skin Texture: Edema: Yes Edema: Yes N/A Scarring: Yes Scarring: Yes Excoriation: No Excoriation: No Induration: No Induration: No Callus: No Callus: No Crepitus: No Crepitus: No Fluctuance: No Fluctuance: No Friable: No Friable: No Rash: No Rash: No Periwound Skin Maceration: Yes Maceration: Yes N/A Moisture: Moist: Yes Moist: Yes Dry/Scaly: No Dry/Scaly: No Periwound Skin Color: Hemosiderin Staining: Yes Erythema: Yes N/A Atrophie Blanche: No Hemosiderin Staining: Yes Cyanosis: No Atrophie Blanche: No Ecchymosis: No Cyanosis: No Erythema: No Ecchymosis: No Mottled: No Mottled: No Pallor: No Pallor: No Rubor: No Rubor: No Erythema Location: N/A Circumferential N/A Temperature: No Abnormality No Abnormality N/A Tenderness on Yes Yes N/A Palpation: Wound Preparation: Ulcer Cleansing: Other: Ulcer Cleansing: Other: N/A soap and water soap and water Topical Anesthetic Topical Anesthetic Applied: Other: lidocaine Applied: Other: lidocaine 4% 4% Treatment Notes Electronic Signature(s) Signed: 11/01/2016 5:03:25 PM By: Alric Quan Entered By: Alric Quan on 11/01/2016 09:22:50 Brooke Hopkins ([id number]) -------------------------------------------------------------------------------- Multi-Disciplinary Care Plan Details Patient Name: ZELTA, ENFIELD. Date of Service: 11/01/2016 8:45 AM Medical Record Number: [id number] Patient Account Number: [id number] Date of Birth/Sex: 04/12/1951 (65 y.o. Female) Treating RN: Ahmed Prima Primary Care Physician: FITZGERALD, DAVID Other Clinician: Referring Physician: FITZGERALD, DAVID Treating  Physician/Extender: Frann Rider in Treatment: 43 Active Inactive Orientation to the Wound Care Program Nursing Diagnoses: Knowledge deficit related to the wound healing center program Goals: Patient/caregiver will verbalize understanding of the [place] [facility] Date Initiated: 06/21/2016 Goal Status: Active Interventions: Provide education on orientation to the wound center Notes: Wound/Skin Impairment Nursing Diagnoses: Impaired tissue integrity Goals: Patient/caregiver will verbalize understanding of skin care regimen Date Initiated: 06/21/2016 Goal Status: Active Ulcer/skin breakdown will have a volume reduction of 30% by week 4 Date Initiated: 06/21/2016 Goal Status: Active Ulcer/skin breakdown will have a volume reduction of 50% by week 8 Date Initiated: 06/21/2016 Goal Status: Active Ulcer/skin breakdown will have a volume reduction of 80% by week 12 Date Initiated: 06/21/2016 Goal Status: Active Ulcer/skin breakdown will heal within 14 weeks Date Initiated: 06/21/2016 Goal Status: Active AJLA, MCGEACHY ([id number]) Interventions: Assess patient/caregiver ability to obtain necessary supplies Assess patient/caregiver ability to perform ulcer/skin care regimen upon admission and as needed Assess ulceration(s) every visit Provide education on ulcer and skin care Notes: Electronic Signature(s) Signed: 11/01/2016 5:03:25 PM By: Alric Quan Entered By: Alric Quan on 11/01/2016 09:22:43 Brooke Hopkins ([id number]) -------------------------------------------------------------------------------- Pain Assessment Details Patient Name: MADDI, COLLAR. Date of Service: 11/01/2016 8:45 AM Medical Record Number: [id number] Patient Account Number: [id number] Date of Birth/Sex: [birth year] (65 y.o. Female) Treating RN: Carolyne Fiscal, Debi Primary  Care Physician: FITZGERALD, DAVID Other Clinician: Referring Physician: FITZGERALD, DAVID Treating  Physician/Extender: Frann Rider in Treatment: 6 Active Problems Location of Pain Severity and Description of Pain Patient Has Paino No Site Locations With Dressing Change: No Pain Management and Medication Current Pain Management: Electronic Signature(s) Signed: 11/01/2016 5:03:25 PM By: Alric Quan Entered By: Alric Quan on 11/01/2016 09:07:26 Brooke Hopkins ([id number]) -------------------------------------------------------------------------------- Patient/Caregiver Education Details Patient Name: REYANA, LEISEY. Date of Service: 11/01/2016 8:45 AM Medical Record Number: [id number] Patient Account Number: [id number] Date of Birth/Gender: March 22, 1951 (65 y.o. Female) Treating RN: Ahmed Prima Primary Care Physician: FITZGERALD, DAVID Other Clinician: Referring Physician: FITZGERALD, DAVID Treating Physician/Extender: Frann Rider in Treatment: 48 Education Assessment Education Provided To: Patient Education Topics Provided Wound/Skin Impairment: Handouts: Other: change dressing as ordered Methods: Demonstration, Explain/Verbal Responses: State content correctly Electronic Signature(s) Signed: 11/01/2016 5:03:25 PM By: Alric Quan Entered By: Alric Quan on 11/01/2016 09:29:21 Brooke Hopkins ([id number]) -------------------------------------------------------------------------------- Wound Assessment Details Patient Name: DALYLA, CHUI. Date of Service: 11/01/2016 8:45 AM Medical Record Number: [id number] Patient Account Number: [id number] Date of Birth/Sex: 08-03-1951 (65 y.o. Female) Treating RN: Ahmed Prima Primary Care Physician: FITZGERALD, DAVID Other Clinician: Referring Physician: FITZGERALD, DAVID Treating Physician/Extender: Frann Rider in Treatment: 19 Wound Status Wound Number: 1 Primary Etiology: Lymphedema Wound Location: Left Lower Leg - Wound Status: Open Circumfernential Wounding  Event: Gradually Appeared Date Acquired: 12/31/2015 Weeks Of Treatment: 19 Clustered Wound: No Photos Photo Uploaded By: Alric Quan on 11/01/2016 10:22:41 Wound Measurements Length: (cm) 4.5 Width: (cm) 1.5 Depth: (cm) 0.2 Area: (cm) 5.301 Volume: (cm) 1.06 % Reduction in Area: 94.7% % Reduction in Volume: 94.7% Epithelialization: Large (67-100%) Tunneling: No Undermining: No Wound Description Classification: Partial Thickness Wound Margin: Flat and Intact Exudate Amount: Large Exudate Type: Purulent Exudate Color: yellow, brown, green Foul Odor After Cleansing: Yes Due to Product Use: No Wound Bed Granulation Amount: Medium (34-66%) Exposed Structure Granulation Quality: Red Fascia Exposed: No Necrotic Amount: Medium (34-66%) Fat Layer Exposed: No ROSY, ESTABROOK ([id number]) Necrotic Quality: Adherent Slough Tendon Exposed: No Muscle Exposed: No Joint Exposed: No Bone Exposed: No Limited to Skin Breakdown Periwound Skin Texture Texture Color No Abnormalities Noted: No No Abnormalities Noted: No Callus: No Atrophie Blanche: No Crepitus: No Cyanosis: No Excoriation: No Ecchymosis: No Fluctuance: No Erythema: No Friable: No Hemosiderin Staining: Yes Induration: No Mottled: No Localized Edema: Yes Pallor: No Rash: No Rubor: No Scarring: Yes Temperature / Pain Moisture Temperature: No Abnormality No Abnormalities Noted: No Tenderness on Palpation: Yes Dry / Scaly: No Maceration: Yes Moist: Yes Wound Preparation Ulcer Cleansing: Other: soap and water, Topical Anesthetic Applied: Other: lidocaine 4%, Treatment Notes Wound #1 (Left, Circumferential Lower Leg) 1. Cleansed with: Clean wound with Normal Saline Cleanse wound with antibacterial soap and water 2. Anesthetic Topical Lidocaine 4% cream to wound bed prior to debridement 4. Dressing Applied: Santyl Ointment 5. Secondary Dressing Applied ABD Pad 7. Secured with Tape 3 Layer  Compression System - Bilateral Notes xtrasorb Electronic Signature(s) RYLEI, MASELLA ([id number]) Signed: 11/01/2016 5:03:25 PM By: Alric Quan Entered By: Alric Quan on 11/01/2016 09:13:41 Brooke Hopkins ([id number]) -------------------------------------------------------------------------------- Wound Assessment Details Patient Name: BRET, STAMOUR. Date of Service: 11/01/2016 8:45 AM Medical Record Number: [id number] Patient Account Number: [id number] Date of Birth/Sex: Nov 21, 1951 (65 y.o. Female) Treating RN: Ahmed Prima Primary Care Physician: FITZGERALD, DAVID Other Clinician: Referring Physician: FITZGERALD, DAVID Treating Physician/Extender: Frann Rider in Treatment: 19 Wound Status Wound Number: 2 Primary Etiology: Lymphedema  Wound Location: Right Lower Leg - Wound Status: Open Circumfernential Wounding Event: Gradually Appeared Date Acquired: 12/31/2015 Weeks Of Treatment: 19 Clustered Wound: Yes Photos Photo Uploaded By: Alric Quan on 11/01/2016 10:22:42 Wound Measurements Length: (cm) 2 Width: (cm) 11.5 Depth: (cm) 0.2 Clustered Quantity: 3 Area: (cm) 18.064 Volume: (cm) 3.613 % Reduction in Area: 92% % Reduction in Volume: 92% Epithelialization: None Tunneling: No Undermining: No Wound Description Classification: Partial Thickness Wound Margin: Indistinct, nonvisible Exudate Amount: Large Exudate Type: Purulent Exudate Color: yellow, brown, green Foul Odor After Cleansing: Yes Due to Product Use: No Wound Bed Granulation Amount: Medium (34-66%) Exposed Structure Granulation Quality: Red Fascia Exposed: No DELANCEY, MORAES ([id number]) Necrotic Amount: Medium (34-66%) Fat Layer Exposed: No Necrotic Quality: Eschar, Adherent Slough Tendon Exposed: No Muscle Exposed: No Joint Exposed: No Bone Exposed: No Limited to Skin Breakdown Periwound Skin Texture Texture Color No Abnormalities Noted: No No  Abnormalities Noted: No Callus: No Atrophie Blanche: No Crepitus: No Cyanosis: No Excoriation: No Ecchymosis: No Fluctuance: No Erythema: Yes Friable: No Erythema Location: Circumferential Induration: No Hemosiderin Staining: Yes Localized Edema: Yes Mottled: No Rash: No Pallor: No Scarring: Yes Rubor: No Moisture Temperature / Pain No Abnormalities Noted: No Temperature: No Abnormality Dry / Scaly: No Tenderness on Palpation: Yes Maceration: Yes Moist: Yes Wound Preparation Ulcer Cleansing: Other: soap and water, Topical Anesthetic Applied: Other: lidocaine 4%, Treatment Notes Wound #2 (Right, Circumferential Lower Leg) 1. Cleansed with: Clean wound with Normal Saline Cleanse wound with antibacterial soap and water 2. Anesthetic Topical Lidocaine 4% cream to wound bed prior to debridement 4. Dressing Applied: Santyl Ointment 5. Secondary Dressing Applied ABD Pad 7. Secured with Tape 3 Layer Compression System - Bilateral Notes xtrasorb TRACE, CEDERBERG ([id number]) Electronic Signature(s) Signed: 11/01/2016 5:03:25 PM By: Alric Quan Entered By: Alric Quan on 11/01/2016 09:14:41 Brooke Hopkins ([id number]) -------------------------------------------------------------------------------- [place] Details Patient Name: MACHELL, WIRTHLIN. Date of Service: 11/01/2016 8:45 AM Medical Record Number: [id number] Patient Account Number: [id number] Date of Birth/Sex: 07/03/1951 (65 y.o. Female) Treating RN: Ahmed Prima Primary Care Physician: FITZGERALD, DAVID Other Clinician: Referring Physician: FITZGERALD, DAVID Treating Physician/Extender: Frann Rider in Treatment: 19 Vital Signs Time Taken: 09:07 Temperature (F): 97.9 Height (in): 67 Pulse (bpm): 81 Weight (lbs): 300 Respiratory Rate (breaths/min): 20 Body Mass Index (BMI): 47 Blood Pressure (mmHg): 176/72 Reference Range: 80 - 120 mg / dl Electronic Signature(s) Signed:  11/01/2016 5:03:25 PM By: Alric Quan Entered By: Alric Quan on 11/01/2016 09:07:51

## 2016-11-08 ENCOUNTER — Encounter: Payer: 59 | Admitting: Surgery

## 2016-11-08 DIAGNOSIS — L97811 Non-pressure chronic ulcer of other part of right lower leg limited to breakdown of skin: Secondary | ICD-10-CM | POA: Diagnosis not present

## 2016-11-08 DIAGNOSIS — I890 Lymphedema, not elsewhere classified: Secondary | ICD-10-CM | POA: Diagnosis not present

## 2016-11-08 DIAGNOSIS — E11622 Type 2 diabetes mellitus with other skin ulcer: Secondary | ICD-10-CM | POA: Diagnosis not present

## 2016-11-08 DIAGNOSIS — L97821 Non-pressure chronic ulcer of other part of left lower leg limited to breakdown of skin: Secondary | ICD-10-CM | POA: Diagnosis not present

## 2016-11-09 NOTE — Progress Notes (Signed)
KAHLEAH, CRASS ([id number]) Visit Report for 11/08/2016 Arrival Information Details Patient Name: Brooke Hopkins, Brooke Hopkins. Date of Service: 11/08/2016 8:45 AM Medical Record Number: [id number] Patient Account Number: [id number] Date of Birth/Sex: 11-07-1951 (65 y.o. Female) Treating RN: [name] Barman Primary Care Physician: FITZGERALD, DAVID Other Clinician: Referring Physician: FITZGERALD, DAVID Treating Physician/Extender: Frann Rider in Treatment: 44 Visit Information History Since Last Visit Added or deleted any medications: No Patient Arrived: Wheel Chair Any new allergies or adverse reactions: No Arrival Time: 08:44 Had a fall or experienced change in No activities of daily living that may affect Accompanied By: son risk of falls: Transfer Assistance: None Signs or symptoms of abuse/neglect since last No Patient Identification Verified: Yes visito Secondary Verification Process Yes Hospitalized since last visit: No Completed: Has Dressing in Place as Prescribed: Yes Patient Requires Transmission-Based No Has Compression in Place as Prescribed: Yes Precautions: Pain Present Now: No Patient Has Alerts: No Electronic Signature(s) Signed: 11/08/2016 4:48:58 PM By: Gretta Cool, RN, BSN, Kim RN, BSN Entered By: Gretta Cool, RN, BSN, Kim on 11/08/2016 08:45:04 [name] ([id number]) -------------------------------------------------------------------------------- Compression Therapy Details Patient Name: Brooke Hopkins, Brooke Hopkins. Date of Service: 11/08/2016 8:45 AM Medical Record Number: [id number] Patient Account Number: [id number] Date of Birth/Sex: 1951/10/08 (65 y.o. Female) Treating RN: [name] Barman Primary Care Physician: FITZGERALD, DAVID Other Clinician: Referring Physician: FITZGERALD, DAVID Treating Physician/Extender: Frann Rider in Treatment: 20 Compression Therapy Performed for Wound Wound #1 Left,Circumferential Lower Leg Assessment: Performed By:  Clinician [name] Barman, RN Compression Type: Three Layer Post Procedure Diagnosis Same as Pre-procedure Electronic Signature(s) Signed: 11/08/2016 4:48:58 PM By: Gretta Cool, RN, BSN, Kim RN, BSN Entered By: Gretta Cool, RN, BSN, Kim on 11/08/2016 09:11:29 [name] ([id number]) -------------------------------------------------------------------------------- Compression Therapy Details Patient Name: Brooke Hopkins, Brooke Hopkins. Date of Service: 11/08/2016 8:45 AM Medical Record Number: [id number] Patient Account Number: [id number] Date of Birth/Sex: 03/06/1951 (65 y.o. Female) Treating RN: [name] Barman Primary Care Physician: FITZGERALD, DAVID Other Clinician: Referring Physician: FITZGERALD, DAVID Treating Physician/Extender: Frann Rider in Treatment: 20 Compression Therapy Performed for Wound Wound #2 Right,Circumferential Lower Leg Assessment: Performed By: Clinician [name] Barman, RN Compression Type: Three Layer Post Procedure Diagnosis Same as Pre-procedure Electronic Signature(s) Signed: 11/08/2016 4:48:58 PM By: Gretta Cool, RN, BSN, Kim RN, BSN Entered By: Gretta Cool, RN, BSN, Kim on 11/08/2016 09:11:30 [name] ([id number]) -------------------------------------------------------------------------------- Encounter Discharge Information Details Patient Name: Brooke Hopkins, Brooke Hopkins. Date of Service: 11/08/2016 8:45 AM Medical Record Number: [id number] Patient Account Number: [id number] Date of Birth/Sex: 04-Sep-1951 (65 y.o. Female) Treating RN: [name] Barman Primary Care Physician: FITZGERALD, DAVID Other Clinician: Referring Physician: FITZGERALD, DAVID Treating Physician/Extender: Frann Rider in Treatment: 20 Encounter Discharge Information Items Discharge Pain Level: 0 Discharge Condition: Stable Ambulatory Status: Wheelchair Discharge Destination: Home Transportation: Private Auto Accompanied By: son Schedule Follow-up Appointment: Yes Medication Reconciliation  completed and provided to Patient/Care Yes [name]: Provided on Clinical Summary of Care: 11/08/2016 Form Type Recipient Paper Patient LB Electronic Signature(s) Signed: 11/08/2016 4:48:58 PM By: Gretta Cool, RN, BSN, Kim RN, BSN Previous Signature: 11/08/2016 9:29:09 AM Version By: Ruthine Dose Entered By: Gretta Cool RN, BSN, Kim on 11/08/2016 09:31:57 [name] ([id number]) -------------------------------------------------------------------------------- Lower Extremity Assessment Details Patient Name: Brooke Hopkins, Brooke Hopkins. Date of Service: 11/08/2016 8:45 AM Medical Record Number: [id number] Patient Account Number: [id number] Date of Birth/Sex: 1951/01/14 (65 y.o. Female) Treating RN: [name] Barman Primary Care Physician: FITZGERALD, DAVID Other Clinician: Referring Physician: FITZGERALD, DAVID Treating Physician/Extender: Frann Rider in Treatment: 20 Edema Assessment Assessed: [Left: Yes] [Right: Yes] Edema: [  Left: Yes] [Right: Yes] Calf Left: Right: Point of Measurement: 32 cm From Medial Instep 55.5 cm 50.5 cm Ankle Left: Right: Point of Measurement: 11 cm From Medial Instep 33.7 cm 31 cm Vascular Assessment Pulses: Posterior Tibial Dorsalis Pedis Palpable: [Left:Yes] [Right:Yes] Extremity colors, hair growth, and conditions: Extremity Color: [Left:Red] [Right:Red] Hair Growth on Extremity: [Left:Yes] [Right:Yes] Temperature of Extremity: [Left:Cool] [Right:Cool] Capillary Refill: [Left:< 3 seconds] [Right:< 3 seconds] Dependent Rubor: [Left:No] [Right:No] Blanched when Elevated: [Left:No] [Right:No] Lipodermatosclerosis: [Left:No] [Right:No] Toe Nail Assessment Left: Right: Thick: Yes Yes Discolored: Yes Yes Deformed: Yes Yes Improper Length and Hygiene: Yes Yes Electronic Signature(s) Signed: 11/08/2016 4:48:58 PM By: Gretta Cool, RN, BSN, Kim RN, BSN [address], [name] ([id number]) Entered By: Gretta Cool, RN, BSN, Kim on 11/08/2016 08:57:33 [name]  ([id number]) -------------------------------------------------------------------------------- Multi Wound Chart Details Patient Name: Brooke Hopkins, Brooke Hopkins. Date of Service: 11/08/2016 8:45 AM Medical Record Number: [id number] Patient Account Number: [id number] Date of Birth/Sex: 1951-06-27 (65 y.o. Female) Treating RN: [name] Barman Primary Care Physician: FITZGERALD, DAVID Other Clinician: Referring Physician: FITZGERALD, DAVID Treating Physician/Extender: Frann Rider in Treatment: 20 Vital Signs Height(in): 67 Pulse(bpm): 83 Weight(lbs): 300 Blood Pressure 168/88 (mmHg): Body Mass Index(BMI): 47 Temperature(F): 97.8 Respiratory Rate 18 (breaths/min): Photos: [N/A:N/A] Wound Location: Left Lower Leg - Right Lower Leg - N/A Circumfernential Circumfernential Wounding Event: Gradually Appeared Gradually Appeared N/A Primary Etiology: Lymphedema Lymphedema N/A Date Acquired: 12/31/2015 12/31/2015 N/A Weeks of Treatment: 20 20 N/A Wound Status: Open Open N/A Clustered Wound: No Yes N/A Clustered Quantity: N/A 3 N/A Measurements L x W x D 8x12x1 17x12x0.2 N/A (cm) Area (cm) : 75.398 160.221 N/A Volume (cm) : 75.398 32.044 N/A % Reduction in Area: 25.00% 29.20% N/A % Reduction in Volume: -275.00% 29.20% N/A Classification: Partial Thickness Partial Thickness N/A Exudate Amount: Large Large N/A Exudate Type: Serous Purulent N/A Exudate Color: amber yellow, brown, green N/A Foul Odor After Yes Yes N/A Cleansing: Odor Anticipated Due to No No N/A Product Use: Wound Margin: Flat and Intact Indistinct, nonvisible N/A Brooke Hopkins, Brooke Hopkins. ([id number]) Granulation Amount: Medium (34-66%) Medium (34-66%) N/A Granulation Quality: Red Red N/A Necrotic Amount: Medium (34-66%) Medium (34-66%) N/A Exposed Structures: Fascia: No Fascia: No N/A Fat: No Fat: No Tendon: No Tendon: No Muscle: No Muscle: No Joint: No Joint: No Bone: No Bone: No Limited to Skin Limited to  Skin Breakdown Breakdown Epithelialization: Large (67-100%) Small (1-33%) N/A Periwound Skin Texture: Edema: Yes Edema: Yes N/A Excoriation: Yes Excoriation: Yes Induration: No Induration: No Callus: No Callus: No Crepitus: No Crepitus: No Fluctuance: No Fluctuance: No Friable: No Friable: No Rash: No Rash: No Scarring: No Scarring: No Periwound Skin Maceration: Yes Maceration: Yes N/A Moisture: Moist: Yes Moist: Yes Dry/Scaly: No Dry/Scaly: No Periwound Skin Color: Rubor: Yes Erythema: Yes N/A Atrophie Blanche: No Hemosiderin Staining: Yes Cyanosis: No Rubor: Yes Ecchymosis: No Atrophie Blanche: No Erythema: No Cyanosis: No Hemosiderin Staining: No Ecchymosis: No Mottled: No Mottled: No Pallor: No Pallor: No Erythema Location: N/A Circumferential N/A Temperature: No Abnormality No Abnormality N/A Tenderness on Yes Yes N/A Palpation: Wound Preparation: Ulcer Cleansing: Other: Ulcer Cleansing: Other: N/A soap and water soap and water Topical Anesthetic Topical Anesthetic Applied: Other: lidocaine Applied: Other: lidocaine 4% 4% Treatment Notes Electronic Signature(s) Signed: 11/08/2016 4:48:58 PM By: Gretta Cool, RN, BSN, Kim RN, BSN Entered By: Gretta Cool, RN, BSN, Kim on 11/08/2016 09:08:26 Brooke Hopkins, JACOBS ([id number], KARAM ([id number]) -------------------------------------------------------------------------------- Multi-Disciplinary Care Plan Details Patient Name: Brooke Hopkins, Brooke Hopkins. Date of Service: 11/08/2016 8:45  AM Medical Record Number: [id number] Patient Account Number: [id number] Date of Birth/Sex: 1951/11/04 (65 y.o. Female) Treating RN: [name] Barman Primary Care Physician: FITZGERALD, DAVID Other Clinician: Referring Physician: FITZGERALD, DAVID Treating Physician/Extender: Frann Rider in Treatment: 20 Active Inactive Orientation to the Wound Care Program Nursing Diagnoses: Knowledge deficit related to the wound healing  center program Goals: Patient/caregiver will verbalize understanding of the [place] Program Date Initiated: 06/21/2016 Goal Status: Active Interventions: Provide education on orientation to the wound center Notes: Wound/Skin Impairment Nursing Diagnoses: Impaired tissue integrity Goals: Patient/caregiver will verbalize understanding of skin care regimen Date Initiated: 06/21/2016 Goal Status: Active Ulcer/skin breakdown will have a volume reduction of 30% by week 4 Date Initiated: 06/21/2016 Goal Status: Active Ulcer/skin breakdown will have a volume reduction of 50% by week 8 Date Initiated: 06/21/2016 Goal Status: Active Ulcer/skin breakdown will have a volume reduction of 80% by week 12 Date Initiated: 06/21/2016 Goal Status: Active Ulcer/skin breakdown will heal within 14 weeks Date Initiated: 06/21/2016 Goal Status: Active Brooke Hopkins, Brooke Hopkins ([id number]) Interventions: Assess patient/caregiver ability to obtain necessary supplies Assess patient/caregiver ability to perform ulcer/skin care regimen upon admission and as needed Assess ulceration(s) every visit Provide education on ulcer and skin care Notes: Electronic Signature(s) Signed: 11/08/2016 4:48:58 PM By: Gretta Cool, RN, BSN, Kim RN, BSN Entered By: Gretta Cool, RN, BSN, Kim on 11/08/2016 09:06:55 [name] ([id number]) -------------------------------------------------------------------------------- Pain Assessment Details Patient Name: Brooke Hopkins, Brooke Hopkins. Date of Service: 11/08/2016 8:45 AM Medical Record Number: [id number] Patient Account Number: [id number] Date of Birth/Sex: [birth year] (65 y.o. Female) Treating RN: [name] Barman Primary Care Physician: FITZGERALD, DAVID Other Clinician: Referring Physician: FITZGERALD, DAVID Treating Physician/Extender: Frann Rider in Treatment: 20 Active Problems Location of Pain Severity and Description of Pain Patient Has Paino No Site Locations With  Dressing Change: No Pain Management and Medication Current Pain Management: Electronic Signature(s) Signed: 11/08/2016 4:48:58 PM By: Gretta Cool, RN, BSN, Kim RN, BSN Entered By: Gretta Cool, RN, BSN, Kim on 11/08/2016 08:45:12 [name] ([id number]) -------------------------------------------------------------------------------- Patient/Caregiver Education Details Patient Name: Brooke Hopkins, Brooke Hopkins. Date of Service: 11/08/2016 8:45 AM Medical Record Number: [id number] Patient Account Number: [id number] Date of Birth/Gender: [birth year] (65 y.o. Female) Treating RN: [name] Barman Primary Care Physician: FITZGERALD, DAVID Other Clinician: Referring Physician: FITZGERALD, DAVID Treating Physician/Extender: Frann Rider in Treatment: 20 Education Assessment Education Provided To: Patient Education Topics Provided Wound/Skin Impairment: Handouts: Caring for Your Ulcer Methods: Explain/Verbal Responses: State content correctly Electronic Signature(s) Signed: 11/08/2016 4:48:58 PM By: Gretta Cool, RN, BSN, Kim RN, BSN Entered By: Gretta Cool, RN, BSN, Kim on 11/08/2016 09:32:10 [name] -------------------------------------------------------------------------------- Wound Assessment Details Patient Name: Brooke Hopkins, Brooke Hopkins. Date of Service: 11/08/2016 8:45 AM Medical Record Number: [id number] Patient Account Number: [id number] Date of Birth/Sex: Mar 26, 1951 (65 y.o. Female) Treating RN: [name] Barman Primary Care Physician: FITZGERALD, DAVID Other Clinician: Referring Physician: FITZGERALD, DAVID Treating Physician/Extender: Frann Rider in Treatment: 20 Wound Status Wound Number: 1 Primary Etiology: Lymphedema Wound Location: Left Lower Leg - Wound Status: Open Circumfernential Wounding Event: Gradually Appeared Date Acquired: 12/31/2015 Weeks Of Treatment: 20 Clustered Wound: No Photos Wound Measurements Length: (cm) 8 Width: (cm) 12 Depth: (cm) 1 Area:  (cm) 75.398 Volume: (cm) 75.398 % Reduction in Area: 25% % Reduction in Volume: -275% Epithelialization: Large (67-100%) Tunneling: No Undermining: No Wound Description Classification: Partial Thickness Wound Margin: Flat and Intact Exudate Amount: Large Exudate Type: Serous Exudate Color: amber Foul Odor After Cleansing: Yes Due to Product Use: No Wound Bed Granulation Amount:  Medium (34-66%) Exposed Structure Granulation Quality: Red Fascia Exposed: No Necrotic Amount: Medium (34-66%) Fat Layer Exposed: No Necrotic Quality: Adherent Slough Tendon Exposed: No Muscle Exposed: No Joint Exposed: No Bone Exposed: No ARIYANA, FAW. ([id number]) Limited to Skin Breakdown Periwound Skin Texture Texture Color No Abnormalities Noted: No No Abnormalities Noted: No Callus: No Atrophie Blanche: No Crepitus: No Cyanosis: No Excoriation: Yes Ecchymosis: No Fluctuance: No Erythema: No Friable: No Hemosiderin Staining: No Induration: No Mottled: No Localized Edema: Yes Pallor: No Rash: No Rubor: Yes Scarring: No Temperature / Pain Moisture Temperature: No Abnormality No Abnormalities Noted: No Tenderness on Palpation: Yes Dry / Scaly: No Maceration: Yes Moist: Yes Wound Preparation Ulcer Cleansing: Other: soap and water, Topical Anesthetic Applied: Other: lidocaine 4%, Treatment Notes Wound #1 (Left, Circumferential Lower Leg) 1. Cleansed with: Clean wound with Normal Saline Cleanse wound with antibacterial soap and water 2. Anesthetic Topical Lidocaine 4% cream to wound bed prior to debridement 4. Dressing Applied: Aquacel Ag 5. Secondary Dressing Applied ABD Pad 7. Secured with Tape 3 Layer Compression System - Bilateral Notes Manufacturing systems engineer) Signed: 11/08/2016 4:48:58 PM By: Gretta Cool, RN, BSN, Kim RN, BSN Entered By: Gretta Cool, RN, BSN, Kim on 11/08/2016 09:01:44 [name]  ([id number]) -------------------------------------------------------------------------------- Wound Assessment Details Patient Name: DEVLYNN, KNOFF. Date of Service: 11/08/2016 8:45 AM Medical Record Number: [id number] Patient Account Number: [id number] Date of Birth/Sex: 04-04-1951 (65 y.o. Female) Treating RN: [name] Barman Primary Care Physician: FITZGERALD, DAVID Other Clinician: Referring Physician: FITZGERALD, DAVID Treating Physician/Extender: Frann Rider in Treatment: 20 Wound Status Wound Number: 2 Primary Etiology: Lymphedema Wound Location: Right Lower Leg - Wound Status: Open Circumfernential Wounding Event: Gradually Appeared Date Acquired: 12/31/2015 Weeks Of Treatment: 20 Clustered Wound: Yes Photos Wound Measurements Length: (cm) 17 Width: (cm) 12 Depth: (cm) 0.2 Clustered Quantity: 3 Area: (cm) 160.221 Volume: (cm) 32.044 % Reduction in Area: 29.2% % Reduction in Volume: 29.2% Epithelialization: Small (1-33%) Tunneling: No Undermining: No Wound Description Classification: Partial Thickness Wound Margin: Indistinct, nonvisible Exudate Amount: Large Exudate Type: Purulent Exudate Color: yellow, brown, green Foul Odor After Cleansing: Yes Due to Product Use: No Wound Bed Granulation Amount: Medium (34-66%) Exposed Structure Granulation Quality: Red Fascia Exposed: No Necrotic Amount: Medium (34-66%) Fat Layer Exposed: No Necrotic Quality: Adherent Slough Tendon Exposed: No Muscle Exposed: No Joint Exposed: No JAMAIRA, SHERK ([id number]) Bone Exposed: No Limited to Skin Breakdown Periwound Skin Texture Texture Color No Abnormalities Noted: No No Abnormalities Noted: No Callus: No Atrophie Blanche: No Crepitus: No Cyanosis: No Excoriation: Yes Ecchymosis: No Fluctuance: No Erythema: Yes Friable: No Erythema Location: Circumferential Induration: No Hemosiderin Staining: Yes Localized Edema: Yes Mottled: No Rash:  No Pallor: No Scarring: No Rubor: Yes Moisture Temperature / Pain No Abnormalities Noted: No Temperature: No Abnormality Dry / Scaly: No Tenderness on Palpation: Yes Maceration: Yes Moist: Yes Wound Preparation Ulcer Cleansing: Other: soap and water, Topical Anesthetic Applied: Other: lidocaine 4%, Treatment Notes Wound #2 (Right, Circumferential Lower Leg) 1. Cleansed with: Clean wound with Normal Saline Cleanse wound with antibacterial soap and water 2. Anesthetic Topical Lidocaine 4% cream to wound bed prior to debridement 4. Dressing Applied: Aquacel Ag 5. Secondary Dressing Applied ABD Pad 7. Secured with Tape 3 Layer Compression System - Bilateral Notes Manufacturing systems engineer) Signed: 11/08/2016 4:48:58 PM By: Gretta Cool, RN, BSN, Kim RN, BSN Entered By: Gretta Cool, RN, BSN, Kim on 11/08/2016 09:02:30 SHANAUTICA, FORKER ([id number], DELELLIS ([id number]) -------------------------------------------------------------------------------- Vitals Details Patient Name: BELA, BONAPARTE.  Date of Service: 11/08/2016 8:45 AM Medical Record Number: [id number] Patient Account Number: [id number] Date of Birth/Sex: 30-Nov-1951 (65 y.o. Female) Treating RN: [name] Barman Primary Care Physician: FITZGERALD, DAVID Other Clinician: Referring Physician: FITZGERALD, DAVID Treating Physician/Extender: Frann Rider in Treatment: 20 Vital Signs Time Taken: 08:45 Temperature (F): 97.8 Height (in): 67 Pulse (bpm): 83 Weight (lbs): 300 Respiratory Rate (breaths/min): 18 Body Mass Index (BMI): 47 Blood Pressure (mmHg): 168/88 Reference Range: 80 - 120 mg / dl Notes Manuel pressure Electronic Signature(s) Signed: 11/08/2016 4:48:58 PM By: Gretta Cool, RN, BSN, Kim RN, BSN Entered By: Gretta Cool, RN, BSN, Kim on 11/08/2016 08:47:51

## 2016-11-11 ENCOUNTER — Ambulatory Visit
Admission: RE | Admit: 2016-11-11 | Discharge: 2016-11-11 | Disposition: A | Discharge status: Home / Self Care | Payer: 59 | Source: Ambulatory Visit | Attending: Infectious Diseases | Admitting: Infectious Diseases

## 2016-11-11 DIAGNOSIS — Z95828 Presence of other vascular implants and grafts: Secondary | ICD-10-CM | POA: Diagnosis not present

## 2016-11-11 DIAGNOSIS — I700 Atherosclerosis of aorta: Secondary | ICD-10-CM | POA: Insufficient documentation

## 2016-11-11 LAB — GLUCOSE, CAPILLARY: Glucose-Capillary: 140 mg/dL — ABNORMAL HIGH (ref 65–99)

## 2016-11-11 MED ORDER — SODIUM CHLORIDE FLUSH 0.9 % IV SOLN
INTRAVENOUS | Status: AC
  Administered 2016-11-11: 10 mL via INTRAVENOUS
  Filled 2016-11-11: qty 20

## 2016-11-11 MED ORDER — SODIUM CHLORIDE 0.9% FLUSH
10.0000 mL | Freq: Once | INTRAVENOUS | Status: AC
  Administered 2016-11-11: 10 mL via INTRAVENOUS

## 2016-11-11 MED ORDER — DEXTROSE 5 % IV SOLN
1.0000 g | Freq: Two times a day (BID) | INTRAVENOUS | Status: DC
  Administered 2016-11-11: 1 g via INTRAVENOUS
  Filled 2016-11-11 (×3): qty 1

## 2016-11-11 MED ORDER — LIDOCAINE HCL (PF) 1 % IJ SOLN
INTRAMUSCULAR | Status: AC
  Administered 2016-11-11: 3 mL via INTRADERMAL
  Filled 2016-11-11: qty 6

## 2016-11-11 MED ORDER — SODIUM CHLORIDE 0.9 % IV SOLN
INTRAVENOUS | Status: DC
  Administered 2016-11-11: 13:00:00 via INTRAVENOUS

## 2016-11-11 MED ORDER — LIDOCAINE HCL (PF) 1 % IJ SOLN
5.0000 mL | Freq: Once | INTRAMUSCULAR | Status: AC
  Administered 2016-11-11: 3 mL via INTRADERMAL

## 2016-11-11 NOTE — OR Nursing (Signed)
Patient presents with bilateral legs wraps.

## 2016-11-11 NOTE — OR Nursing (Signed)
Patient reports taking Cipro and Amoxicillin but is unsure of dosage

## 2016-11-11 NOTE — OR Nursing (Signed)
Pt assisted to wheelchair and left unit with husband condition stable- pt on her way to Dr Lauro Franklin office to get dressing changed

## 2016-11-15 ENCOUNTER — Encounter: Payer: 59 | Admitting: Surgery

## 2016-11-15 DIAGNOSIS — I890 Lymphedema, not elsewhere classified: Secondary | ICD-10-CM | POA: Diagnosis not present

## 2016-11-15 DIAGNOSIS — L97222 Non-pressure chronic ulcer of left calf with fat layer exposed: Secondary | ICD-10-CM | POA: Diagnosis not present

## 2016-11-15 DIAGNOSIS — E11622 Type 2 diabetes mellitus with other skin ulcer: Secondary | ICD-10-CM | POA: Diagnosis not present

## 2016-11-15 DIAGNOSIS — L97212 Non-pressure chronic ulcer of right calf with fat layer exposed: Secondary | ICD-10-CM | POA: Diagnosis not present

## 2016-11-16 NOTE — Progress Notes (Addendum)
[name], [name] ([id number]) Visit Report for 11/15/2016 Chief Complaint Document Details Patient Name: Brooke Hopkins, Brooke Hopkins 11/15/2016 8:45 Date of Service: AM Medical Record [id number] Number: Patient Account Number: [id number] [birth year] (65 y.o. Treating RN: [name] Barman Date of Birth/Sex: Female) Other Clinician: Primary Care Physician: FITZGERALD, DAVID Treating Christin Fudge Referring Physician: FITZGERALD, DAVID Physician/Extender: Suella Grove in Treatment: 21 Information Obtained from: Patient Chief Complaint Patients presents for treatment of an open diabetic ulcer and significant lymphedema which she's had for about two years Electronic Signature(s) Signed: 11/15/2016 9:38:27 AM By: Christin Fudge MD, FACS Entered By: Christin Fudge on 11/15/2016 09:38:27 [name] ([id number]) -------------------------------------------------------------------------------- Debridement Details Patient Name: Brooke Hopkins. 11/15/2016 8:45 Date of Service: AM Medical Record [id number] Number: Patient Account Number: [id number] 09-09-1951 (65 y.o. Treating RN: [name] Barman Date of Birth/Sex: Female) Other Clinician: Primary Care Physician: FITZGERALD, DAVID Treating Christin Fudge Referring Physician: FITZGERALD, DAVID Physician/Extender: Suella Grove in Treatment: 21 Debridement Performed for Wound #1 Left,Circumferential Lower Leg Assessment: Performed By: Physician Christin Fudge, MD Debridement: Debridement Pre-procedure Yes - 09:27 Verification/Time Out Taken: Start Time: 09:28 Pain Control: Lidocaine 4% Topical Solution Level: Skin/Subcutaneous Tissue Total Area Debrided (L x 4.5 (cm) x 1 (cm) = 4.5 (cm) W): Tissue and other Viable, Non-Viable, Exudate, Fibrin/Slough, Subcutaneous material debrided: Instrument: Curette Bleeding: Minimum Hemostasis Achieved: Pressure End Time: 09:30 Procedural Pain: 0 Post Procedural Pain: 0 Response to Treatment: Procedure was  tolerated well Post Debridement Measurements of Total Wound Length: (cm) 4.5 Width: (cm) 1 Depth: (cm) 0.1 Volume: (cm) 0.353 Character of Wound/Ulcer Post Requires Further Debridement Debridement: Severity of Tissue Post Debridement: Fat layer exposed Post Procedure Diagnosis Same as Pre-procedure Electronic Signature(s) Signed: 11/15/2016 9:38:14 AM By: Christin Fudge MD, FACS Signed: 11/15/2016 5:15:40 PM By: Gretta Cool RN, BSN, Kim RN, BSN Sosa, Chanelle M. ([id number]) Entered By: Christin Fudge on 11/15/2016 09:38:14 LIZETTE, PAZOS ([id number]) -------------------------------------------------------------------------------- Debridement Details Patient Name: Brooke Hopkins. 11/15/2016 8:45 Date of Service: AM Medical Record [id number] Number: Patient Account Number: [id number] 1951-10-14 (65 y.o. Treating RN: [name] Barman Date of Birth/Sex: Female) Other Clinician: Primary Care Physician: FITZGERALD, DAVID Treating Christin Fudge Referring Physician: FITZGERALD, DAVID Physician/Extender: Suella Grove in Treatment: 21 Debridement Performed for Wound #2 Right,Circumferential Lower Leg Assessment: Performed By: Physician Christin Fudge, MD Debridement: Debridement Pre-procedure Yes - 09:27 Verification/Time Out Taken: Start Time: 09:31 Pain Control: Lidocaine 4% Topical Solution Level: Skin/Subcutaneous Tissue Total Area Debrided (L x 1.8 (cm) x 2 (cm) = 3.6 (cm) W): Tissue and other Viable, Non-Viable, Exudate, Fibrin/Slough, Subcutaneous material debrided: Instrument: Curette Bleeding: Minimum Hemostasis Achieved: Pressure End Time: 09:33 Procedural Pain: 0 Post Procedural Pain: 0 Response to Treatment: Procedure was tolerated well Post Debridement Measurements of Total Wound Length: (cm) 1.8 Width: (cm) 2 Depth: (cm) 0.2 Volume: (cm) 0.565 Character of Wound/Ulcer Post Requires Further Debridement Debridement: Severity of Tissue Post Debridement: Fat  layer exposed Post Procedure Diagnosis Same as Pre-procedure Electronic Signature(s) Signed: 11/15/2016 9:38:21 AM By: Christin Fudge MD, FACS Signed: 11/15/2016 5:15:40 PM By: Gretta Cool RN, BSN, Kim RN, BSN Lococo, Reianna M. ([id number]) Entered By: Christin Fudge on 11/15/2016 09:38:21 ADAM, DEMARY ([id number]) -------------------------------------------------------------------------------- HPI Details Patient Name: Brooke Hopkins 11/15/2016 8:45 Date of Service: AM Medical Record [id number] Number: Patient Account Number: [id number] [birth year] (65 y.o. Treating RN: [name] Barman Date of Birth/Sex: Female) Other Clinician: Primary Care Physician: FITZGERALD, DAVID Treating Christin Fudge Referring Physician: FITZGERALD, DAVID Physician/Extender: Weeks in Treatment: 21 History of Present Illness Location: massive swelling of both lower extremities and  ulceration both lower extremities Quality: Patient reports experiencing a dull pain to affected area(s). Severity: Patient states wound are getting worse. Duration: Patient has had the wound for >2 years prior to seeking treatment at the wound center Timing: Pain in wound is constant (hurts all the time) Context: The wound appeared gradually over time Modifying Factors: Other treatment(s) tried include:she has a lymphedema pump but uses it seldom and she's had several course of antibiotics Associated Signs and Symptoms: Patient reports having difficulty standing for long periods. HPI Description: 65 year old patient seen by Dr. Ola Spurr of infectious disease who has been following up for left lower extremity cellulitis and ulcer with recurrent bilateral lower extremity problems for several months. Recently she had a large right lower extremity bullae which opened out and has been ulcerated. She has seen the vascular group and has been getting Unna's wraps and has a lymphedema pump used in the past. Her prior cultures were  positive for Pseudomonas, Proteus and was treated with amoxicillin. He has also been treated with 2 weeks course of ciprofloxacin and amoxicillin.. Increase of Lasix dose helped with the edema and echo showed no systolic CHF but may be diastolic problems. Past medical history significant for diabetes mellitus type 2, venous stasis ulcer, obesity, diabetic peripheral neuropathy, status post back surgery, cholecystectomy, hysterectomy, arthroscopy of the knee. He is a former smoker and quit smoking in 1984. The patient has seen Dr. Delana Meyer who did not recommend any arterial or venous duplex studies and has been using Unna's wraps and also recommended a lymphedema pump. she has been very noncompliant with using these. 06/28/2016 -- the patient is still on antibiotics as prescribed by Dr. Ola Spurr and he is asked her to take it for 3 weeks. The patient also says she has a lot of redness and pain in the folds of her thigh and lower extremity and this is very painful. 07/26/2016 -- the patient is off antibiotics and has been getting dressing changes 3 times a week. 08/09/2016 -- is been on Cipro and is taking potassium supplements along with her Lasix and is going to be seeing Dr. Ola Spurr for a consult return visit only in November 2017. 08/23/2016 -- she was admitted to the hospital last week and I have reviewed these reports in detail. She was seen by Dr. Ola Spurr who noted a Pseudomonas infection which was resistant to ciprofloxacin and discharged her on Ceftazidime 1 g IV every 12 hourly anyone days. The antibiotic was to be stopped on September 11 and he would see her back in the clinic. AKEISHA, LAGERQUIST ([id number]) 08/30/2016 -- had a communication from Dr. Ola Spurr that he would extend her antibiotics by a week if she continued to look like cellulitis was persisting. 09/13/2016 -- the PICC line is out and antibiotics have stopped. 10/04/16: returns today for f/u. reports utilizes  compression pump twice daily. she is compliant with her compression therapy. she reports a new blister on the right proximal posterior calf. no systemic s/s of infection. 10/11/16: returns today for f/u. she reports adherence to her compression pumps, but there is no improvement regarding her BLE edema. there is weeping of fluid. denies fever, chills, body aches or malaise. 10/18/2016 -- she has a significant cellulitis of her right lower extremity which was not there last week. I believe at this stage she will need the expertise of Dr. Ola Spurr to decide on antibiotic coverage. 10/25/2016 -- the patient has had cipro and ampicillin been started by Dr. Ola Spurr and he  is awaiting further cultures. Overall she's been feeling better. 11/08/2016 -- Dr. Ola Spurr this week who has continued with ciprofloxacin and amoxicillin and is awaiting further cultures before deciding to place her on a PICC line. 11/15/2016 -- Dr. Blane Ohara note was reviewed and her cultures growing Pseudomonas in both legs which is sensitive to Cipro and the other is resistant to Cipro and she would get a PICC line and start IV antibiotics -- Ceftazidime 1 g q 12 for 3 weeks. Electronic Signature(s) Signed: 11/15/2016 9:38:37 AM By: Christin Fudge MD, FACS Previous Signature: 11/15/2016 9:33:56 AM Version By: Christin Fudge MD, FACS Previous Signature: 11/15/2016 9:26:54 AM Version By: Christin Fudge MD, FACS Entered By: Christin Fudge on 11/15/2016 09:38:37 SHIVALI, QUACKENBUSH ([id number]) -------------------------------------------------------------------------------- Physical Exam Details Patient Name: HAYLYNN, PHA 11/15/2016 8:45 Date of Service: AM Medical Record [id number] Number: Patient Account Number: [id number] 1951-09-11 (65 y.o. Treating RN: [name] Barman Date of Birth/Sex: Female) Other Clinician: Primary Care Physician: FITZGERALD, DAVID Treating Christin Fudge Referring Physician: FITZGERALD,  DAVID Physician/Extender: Weeks in Treatment: 21 Constitutional . Pulse regular. Respirations normal and unlabored. Afebrile. . Eyes Nonicteric. Reactive to light. Ears, Nose, Mouth, and Throat Lips, teeth, and gums WNL.Marland Kitchen Moist mucosa without lesions. Neck supple and nontender. No palpable supraclavicular or cervical adenopathy. Normal sized without goiter. Respiratory WNL. No retractions.. Breath sounds WNL, No rubs, rales, rhonchi, or wheeze.. Cardiovascular Heart rhythm and rate regular, no murmur or gallop.. Pedal Pulses WNL. No clubbing, cyanosis or edema. Chest Breasts symmetical and no nipple discharge.. Breast tissue WNL, no masses, lumps, or tenderness.. Lymphatic No adneopathy. No adenopathy. No adenopathy. Musculoskeletal Adexa without tenderness or enlargement.. Digits and nails w/o clubbing, cyanosis, infection, petechiae, ischemia, or inflammatory conditions.. Integumentary (Hair, Skin) No suspicious lesions. No crepitus or fluctuance. No peri-wound warmth or erythema. No masses.Marland Kitchen Psychiatric Judgement and insight Intact.. No evidence of depression, anxiety, or agitation.. Notes the lymphedema persists and the wounds at the posterior part of her lower extremities continue to have significant amount of slough and sharp debridement is done with a #3 curet and bleeding controlled with pressure. The cellulitis is looking much better once she is on IV antibiotics. Electronic Signature(s) Signed: 11/15/2016 9:39:08 AM By: Christin Fudge MD, FACS Entered By: Christin Fudge on 11/15/2016 09:39:07 [name] ([id number]) -------------------------------------------------------------------------------- Physician Orders Details Patient Name: HANIYA, FERN 11/15/2016 8:45 Date of Service: AM Medical Record [id number] Number: Patient Account Number: [id number] 1951/08/24 (65 y.o. Treating RN: Ahmed Prima Date of Birth/Sex: Female) Other Clinician: Primary  Care Physician: FITZGERALD, DAVID Treating Christin Fudge Referring Physician: FITZGERALD, DAVID Physician/Extender: Suella Grove in Treatment: 21 Verbal / Phone Orders: Yes Clinician: Carolyne Fiscal, Debi Read Back and Verified: Yes Diagnosis Coding Wound Cleansing Wound #1 Left,Circumferential Lower Leg o Clean wound with Normal Saline. - clinic use o Cleanse wound with mild soap and water - HHRN please scrub wounds with mild soap and water o May Shower, gently pat wound dry prior to applying new dressing. o May shower with protection. Wound #2 Right,Circumferential Lower Leg o Clean wound with Normal Saline. - clinic use o Cleanse wound with mild soap and water - HHRN please scrub wounds with mild soap and water o May Shower, gently pat wound dry prior to applying new dressing. o May shower with protection. Anesthetic Wound #1 Left,Circumferential Lower Leg o Topical Lidocaine 4% cream applied to wound bed prior to debridement - clinic use Wound #2 Right,Circumferential Lower Leg o Topical Lidocaine 4% cream applied to wound bed prior to  debridement - clinic use Skin Barriers/Peri-Wound Care Wound #1 Left,Circumferential Lower Leg o Other: - Lotrisone cream in folds at knees and on reddened areas on legs (Nystatin used in clinic) Wound #2 Right,Circumferential Lower Leg o Other: - Lotrisone cream in folds at knees and on reddened areas on legs (Nystatin used in clinic) Primary Wound Dressing Wound #1 Left,Circumferential Lower Leg o Santyl Ointment - use on the posterior leg wounds o Aquacel Ag Wound #2 Right,Circumferential Lower Leg o Santyl Ointment - use on the posterior leg wounds GILMA, BESSETTE. ([id number]) o Aquacel Ag Secondary Dressing Wound #1 Left,Circumferential Lower Leg o ABD pad o XtraSorb Wound #2 Right,Circumferential Lower Leg o ABD pad o XtraSorb Dressing Change Frequency Wound #1 Left,Circumferential Lower Leg o  Change Dressing Monday, Wednesday, Friday - HHRN to change wraps Monday and Wednesday [facility] Please see pt next Thursday or Friday [facility] will be closed for Thanksgiving 11/21/16-11/24-17 Wound #2 Right,Circumferential Lower Leg o Change Dressing Monday, Wednesday, Friday - HHRN to change wraps Monday and Wednesday [facility] Please see pt next Thursday or Friday [facility] will be closed for Thanksgiving 11/21/16-11/24-17 Follow-up Appointments Wound #1 Left,Circumferential Lower Leg o Return Appointment in 2 weeks. Wound #2 Right,Circumferential Lower Leg o Return Appointment in 2 weeks. Edema Control Wound #1 Left,Circumferential Lower Leg o 3 Layer Compression System - Bilateral - May anchor top of wrap with dome paste, but only wrap around leg one time. o Elevate legs to the level of the heart and pump ankles as often as possible Wound #2 Right,Circumferential Lower Leg o 3 Layer Compression System - Bilateral - May anchor top of wrap with dome paste, but only wrap around leg one time. o Elevate legs to the level of the heart and pump ankles as often as possible Additional Orders / Instructions o Increase protein intake. Home Health Wound #1 Left,Circumferential Lower Leg o Continue Home Health Visits - [facility]---[facility] Please see pt next Thursday or Friday [facility] will be closed for Thanksgiving 11/21/16-11/24-17 o Home Health Nurse may visit [facility] to address patientos wound care needs. MADELYNNE, LASKER. ([id number]) o FACE TO FACE ENCOUNTER: MEDICARE and MEDICAID PATIENTS: I certify that this patient is under my care and that I had a face-to-face encounter that meets the physician face-to-face encounter requirements with this patient on this date. The encounter with the patient was in whole or in part for the following MEDICAL CONDITION: (primary reason for [place]) MEDICAL NECESSITY: I certify, that based on my findings, NURSING  services are a medically necessary home health service. HOME BOUND STATUS: I certify that my clinical findings support that this patient is homebound (i.e., Due to illness or injury, pt requires aid of supportive devices such as crutches, cane, wheelchairs, walkers, the use of special transportation or the assistance of another person to leave their place of residence. There is a normal inability to leave the home and doing so requires considerable and taxing effort. Other absences are for medical reasons / religious services and are infrequent or of short duration when for other reasons). o Please direct any NON-WOUND related issues/requests for orders to patient's Primary Care Physician Wound #2 Right,Circumferential Lower Leg o [place] Visits - [facility]---[facility] Please see pt next Thursday or Friday [facility] will be closed for Thanksgiving 11/21/16-11/24-17 o Home Health Nurse may visit [facility] to address patientos wound care needs. o FACE TO FACE ENCOUNTER: MEDICARE and MEDICAID PATIENTS: I certify that this patient is  under my care and that I had a face-to-face encounter that meets the physician face-to-face encounter requirements with this patient on this date. The encounter with the patient was in whole or in part for the following MEDICAL CONDITION: (primary reason for [place]) MEDICAL NECESSITY: I certify, that based on my findings, NURSING services are a medically necessary home health service. HOME BOUND STATUS: I certify that my clinical findings support that this patient is homebound (i.e., Due to illness or injury, pt requires aid of supportive devices such as crutches, cane, wheelchairs, walkers, the use of special transportation or the assistance of another person to leave their place of residence. There is a normal inability to leave the home and doing so requires considerable and taxing effort. Other absences are for medical reasons /  religious services and are infrequent or of short duration when for other reasons). o Please direct any NON-WOUND related issues/requests for orders to patient's Primary Care Physician Electronic Signature(s) Signed: 11/18/2016 4:55:45 PM By: Christin Fudge MD, FACS Signed: 11/18/2016 5:09:25 PM By: Alric Quan Previous Signature: 11/15/2016 4:17:50 PM Version By: Christin Fudge MD, FACS Previous Signature: 11/15/2016 4:21:18 PM Version By: Alric Quan Entered By: Alric Quan on 11/15/2016 16:25:57 [name] ([id number]) -------------------------------------------------------------------------------- Problem List Details Patient Name: ELISAVET, BUEHRER 11/15/2016 8:45 Date of Service: AM Medical Record [id number] Number: Patient Account Number: [id number] 1951-01-25 (65 y.o. 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 Treating RN: [name] Barman Date of Birth/Sex: Female) Other Clinician: Primary Care Physician: FITZGERALD, DAVID Treating Christin Fudge Referring Physician: FITZGERALD, DAVID Physician/Extender: Weeks in Treatment: 21 Subjective Chief Complaint Information obtained from Patient Patients presents for treatment of an open diabetic ulcer and significant lymphedema which she's had for about two years History of Present Illness (HPI) The following HPI elements were documented for the patient's wound: Location: massive swelling of both lower extremities and ulceration both lower extremities Quality: Patient reports experiencing a dull pain to affected area(s). Severity: Patient states wound are getting worse. Duration: Patient has had the wound for >2 years prior to seeking treatment at the wound center Timing: Pain in wound is constant (hurts all the time) Context: The wound appeared gradually over time Modifying Factors: Other treatment(s) tried include:she has a lymphedema pump but uses it seldom and she's had several course of antibiotics Associated Signs and Symptoms: Patient reports having difficulty standing for long periods. 65 year old patient seen by Dr. Ola Spurr of infectious disease who has been following up for left lower extremity cellulitis and ulcer with recurrent bilateral lower extremity problems for several months. Recently she had a large right lower extremity bullae which opened out and has been ulcerated. She has seen the vascular group and has  been getting Unna's wraps and has a lymphedema pump used in the past. Her prior cultures were positive for Pseudomonas, Proteus and was treated with amoxicillin. He has also been treated with 2 weeks course of ciprofloxacin and amoxicillin.. Increase of Lasix dose helped with the edema and echo showed no systolic CHF but may be diastolic problems. Past medical history significant for diabetes mellitus  type 2, venous stasis ulcer, obesity, diabetic peripheral neuropathy, status post back surgery, cholecystectomy, hysterectomy, arthroscopy of the knee. He is a former smoker and quit smoking in 1984. The patient has seen Dr. Delana Meyer who did not recommend any arterial or venous duplex studies and has been using Unna's wraps and also recommended a lymphedema pump. she has been very noncompliant with using these. 06/28/2016 -- the patient is still on antibiotics as prescribed by Dr. Ola Spurr and he is asked her to take it for 3 weeks. The patient also says she has a lot of redness and pain in the folds of her thigh and lower extremity and this is very painful. SUAN, PYEATT ([id number]) 07/26/2016 -- the patient is off antibiotics and has been getting dressing changes 3 times a week. 08/09/2016 -- is been on Cipro and is taking potassium supplements along with her Lasix and is going to be seeing Dr. Ola Spurr for a consult return visit only in November 2017. 08/23/2016 -- she was admitted to the hospital last week and I have reviewed these reports in detail. She was seen by Dr. Ola Spurr who noted a Pseudomonas infection which was resistant to ciprofloxacin and discharged her on Ceftazidime 1 g IV every 12 hourly anyone days. The antibiotic was to be stopped on September 11 and he would see her back in the clinic. 08/30/2016 -- had a communication from Dr. Ola Spurr that he would extend her antibiotics by a week if she continued to look like cellulitis was persisting. 09/13/2016 -- the PICC line is out and antibiotics have stopped. 10/04/16: returns today for f/u. reports utilizes compression pump twice daily. she is compliant with her compression therapy. she reports a new blister on the right proximal posterior calf. no systemic s/s of infection. 10/11/16: returns today for f/u. she reports adherence to her compression pumps, but there is no improvement regarding her BLE edema. there  is weeping of fluid. denies fever, chills, body aches or malaise. 10/18/2016 -- she has a significant cellulitis of her right lower extremity which was not there last week. I believe at this stage she will need the expertise of Dr. Ola Spurr to decide on antibiotic coverage. 10/25/2016 -- the patient has had cipro and ampicillin been started by Dr. Ola Spurr and he is awaiting further cultures. Overall she's been feeling better. 11/08/2016 -- Dr. Ola Spurr this week who has continued with ciprofloxacin and amoxicillin and is awaiting further cultures before deciding to place her on a PICC line. 11/15/2016 -- Dr. Blane Ohara note was reviewed and her cultures growing Pseudomonas in both legs which is sensitive to Cipro and the other is resistant to Cipro and she would get a PICC line and start IV antibiotics -- Ceftazidime 1 g q 12 for 3 weeks. Objective Constitutional Pulse regular. Respirations normal and unlabored. Afebrile. Vitals Time Taken: 8:57 AM, Height: 67 in, Weight: 300 lbs, BMI: 47, Temperature: 97.7 F, Pulse: 68 bpm, Respiratory Rate: 18 breaths/min, Blood Pressure: 158/58 mmHg. Eyes Nonicteric. Reactive to light. Ears, Nose, Mouth, and Throat Lips, teeth, and gums WNL.Marland Kitchen Moist mucosa without lesions. SHARMEKA, PALMISANO. ([id number]) Neck supple  and nontender. No palpable supraclavicular or cervical adenopathy. Normal sized without goiter. Respiratory WNL. No retractions.. Breath sounds WNL, No rubs, rales, rhonchi, or wheeze.. Cardiovascular Heart rhythm and rate regular, no murmur or gallop.. Pedal Pulses WNL. No clubbing, cyanosis or edema. Chest Breasts symmetical and no nipple discharge.. Breast tissue WNL, no masses, lumps, or tenderness.. Lymphatic No adneopathy. No adenopathy. No adenopathy. Musculoskeletal Adexa without tenderness or enlargement.. Digits and nails w/o clubbing, cyanosis, infection, petechiae, ischemia, or inflammatory  conditions.Marland Kitchen Psychiatric Judgement and insight Intact.. No evidence of depression, anxiety, or agitation.. General Notes: the lymphedema persists and the wounds at the posterior part of her lower extremities continue to have significant amount of slough and sharp debridement is done with a #3 curet and bleeding controlled with pressure. The cellulitis is looking much better once she is on IV antibiotics. Integumentary (Hair, Skin) No suspicious lesions. No crepitus or fluctuance. No peri-wound warmth or erythema. No masses.. Wound #1 status is Open. Original cause of wound was Gradually Appeared. The wound is located on the Left,Circumferential Lower Leg. The wound measures 20cm length x 23cm width x 0.1cm depth; 361.283cm^2 area and 36.128cm^3 volume. The wound is limited to skin breakdown. There is no tunneling or undermining noted. There is a large amount of serous drainage noted. The wound margin is flat and intact. There is medium (34-66%) red granulation within the wound bed. There is a medium (34-66%) amount of necrotic tissue within the wound bed including Adherent Slough. The periwound skin appearance exhibited: Excoriation, Localized Edema, Maceration, Moist, Rubor. The periwound skin appearance did not exhibit: Callus, Crepitus, Fluctuance, Friable, Induration, Rash, Scarring, Dry/Scaly, Atrophie Blanche, Cyanosis, Ecchymosis, Hemosiderin Staining, Mottled, Pallor, Erythema. Periwound temperature was noted as No Abnormality. The periwound has tenderness on palpation. Wound #2 status is Open. Original cause of wound was Gradually Appeared. The wound is located on the Right,Circumferential Lower Leg. The wound measures 11cm length x 10cm width x 0.1cm depth; 86.394cm^2 area and 8.639cm^3 volume. The wound is limited to skin breakdown. There is no tunneling or undermining noted. There is a large amount of purulent drainage noted. The wound margin is indistinct and nonvisible. There is  medium (34-66%) red granulation within the wound bed. There is a medium (34-66%) amount of necrotic tissue within the wound bed including Adherent Slough. The periwound skin appearance exhibited: Excoriation, Localized Edema, Maceration, Moist, Hemosiderin Staining, Rubor, Erythema. The periwound skin appearance did not exhibit: Callus, Crepitus, Fluctuance, Friable, Induration, Rash, Scarring, Dry/Scaly, Atrophie Blanche, Cyanosis, Ecchymosis, Mottled, Pallor. The surrounding wound skin color is noted with erythema which is circumferential. Periwound temperature was noted as No Abnormality. ZAHRIYAH, JOO. ([id number]) The periwound has tenderness on palpation. 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 There was a Skin/Subcutaneous Tissue Debridement ([id number]) debridement with total area of 4.5 sq cm performed by Christin Fudge, MD. with the following instrument(s): Curette to remove Viable and Non-Viable tissue/material including Exudate, Fibrin/Slough, and Subcutaneous after achieving pain control using Lidocaine 4% Topical Solution. A time out was conducted at 09:27, prior to the start of the procedure. A Minimum amount of bleeding was controlled with Pressure. The procedure was  tolerated well with a pain level of 0 throughout and a pain level of 0 following the procedure. Post Debridement Measurements: 4.5cm length x 1cm width x 0.1cm depth; 0.353cm^3 volume. Character of Wound/Ulcer Post Debridement requires further debridement. Severity of Tissue Post Debridement is: Fat layer exposed. Post  procedure Diagnosis Wound #1: Same as Pre-Procedure Wound #2 Wound #2 is a Lymphedema located on the Right,Circumferential Lower Leg . There was a Skin/Subcutaneous Tissue Debridement ([id number]) debridement with total area of 3.6 sq cm performed by Christin Fudge, MD. with the following instrument(s): Curette to remove Viable and Non-Viable tissue/material including Exudate, Fibrin/Slough, and Subcutaneous after achieving pain control using Lidocaine 4% Topical Solution. A time out was conducted at 09:27, prior to the start of the procedure. A Minimum amount of bleeding was controlled with Pressure. The procedure was tolerated well with a pain level of 0 throughout and a pain level of 0 following the procedure. Post Debridement Measurements: 1.8cm length x 2cm width x 0.2cm depth; 0.565cm^3 volume. Character of Wound/Ulcer Post Debridement requires further debridement. Severity of Tissue Post Debridement is: Fat layer exposed. Post procedure Diagnosis Wound #2: Same as Pre-Procedure TEKIA, [place] ([id number]) Plan Wound Cleansing: Wound #1 Left,Circumferential Lower Leg: Clean wound with Normal Saline. - clinic use Cleanse wound with mild soap and water - HHRN please scrub wounds with mild soap and water May Shower, gently pat wound dry prior to applying new dressing. May shower with protection. Wound #2 Right,Circumferential Lower Leg: Clean wound with Normal Saline. - clinic use Cleanse wound with mild soap and water - HHRN please scrub wounds with mild soap and water May Shower, gently pat wound dry prior to applying new dressing. May shower with protection. Anesthetic: Wound #1 Left,Circumferential Lower Leg: Topical Lidocaine 4% cream applied to wound bed prior to debridement - clinic use Wound #2 Right,Circumferential Lower Leg: Topical Lidocaine 4% cream applied to wound bed prior to debridement - clinic use Skin Barriers/Peri-Wound Care: Wound #1 Left,Circumferential  Lower Leg: Other: - Lotrisone cream in folds at knees and on reddened areas on legs (Nystatin used in clinic) Wound #2 Right,Circumferential Lower Leg: Other: - Lotrisone cream in folds at knees and on reddened areas on legs (Nystatin used in clinic) Primary Wound Dressing: Wound #1 Left,Circumferential Lower Leg: Santyl Ointment - use on the posterior leg wounds Aquacel Ag Wound #2 Right,Circumferential Lower Leg: Santyl Ointment - use on the posterior leg wounds Aquacel Ag Secondary Dressing: Wound #1 Left,Circumferential Lower Leg: ABD pad XtraSorb Wound #2 Right,Circumferential Lower Leg: ABD pad XtraSorb Dressing Change Frequency: Wound #1 Left,Circumferential Lower Leg: Change Dressing Monday, Wednesday, Friday - HHRN to change wraps Monday and Wednesday [facility] Please see pt next Thursday or Friday [facility] will be closed for Thanksgiving 11/21/16-11/24- 17 Wound #2 Right,Circumferential Lower Leg: Change Dressing Monday, Wednesday, Friday - HHRN to change wraps Monday and Wednesday [facility] PHYLICIA, MCGAUGH ([id number]) Please see pt next Thursday or Friday [facility] will be closed for Thanksgiving 11/21/16-11/24- 17 Follow-up Appointments: Wound #1 Left,Circumferential Lower Leg: Return Appointment in 2 weeks. Wound #2 Right,Circumferential Lower Leg: Return Appointment in 2 weeks. Edema Control: Wound #1 Left,Circumferential Lower Leg: 3 Layer Compression System - Bilateral - May anchor top of wrap with dome paste, but only wrap around leg one time. Elevate legs to the level of the heart and pump ankles as often as possible Wound #2 Right,Circumferential Lower Leg: 3 Layer Compression System - Bilateral - May anchor top of wrap with  dome paste, but only wrap around leg one time. Elevate legs to the level of the heart and pump ankles as often as possible Additional Orders / Instructions: Increase protein intake. Home Health: Wound #1  Left,Circumferential Lower Leg: Continue Home Health Visits - [facility]---HHRN Please see pt next Thursday or Friday [facility] will be closed for Thanksgiving 11/21/16-11/24-17 Home Health Nurse may visit PRN to address patient s wound care needs. FACE TO FACE ENCOUNTER: MEDICARE and MEDICAID PATIENTS: I certify that this patient is under my care and that I had a face-to-face encounter that meets the physician face-to-face encounter requirements with this patient on this date. The encounter with the patient was in whole or in part for the following MEDICAL CONDITION: (primary reason for [place]) MEDICAL NECESSITY: I certify, that based on my findings, NURSING services are a medically necessary home health service. HOME BOUND STATUS: I certify that my clinical findings support that this patient is homebound (i.e., Due to illness or injury, pt requires aid of supportive devices such as crutches, cane, wheelchairs, walkers, the use of special transportation or the assistance of another person to leave their place of residence. There is a normal inability to leave the home and doing so requires considerable and taxing effort. Other absences are for medical reasons / religious services and are infrequent or of short duration when for other reasons). Please direct any NON-WOUND related issues/requests for orders to patient's Primary Care Physician Wound #2 Right,Circumferential Lower Leg: [place] Visits - [facility]---HHRN Please see pt next Thursday or Friday [facility] will be closed for Thanksgiving 11/21/16-11/24-17 Home Health Nurse may visit PRN to address patient s wound care needs. FACE TO FACE ENCOUNTER: MEDICARE and MEDICAID PATIENTS: I certify that this patient is under my care and that I had a face-to-face encounter that meets the physician face-to-face encounter requirements with this patient on this date. The encounter with the patient was in whole or in  part for the following MEDICAL CONDITION: (primary reason for [place]) MEDICAL NECESSITY: I certify, that based on my findings, NURSING services are a medically necessary home health service. HOME BOUND STATUS: I certify that my clinical findings support that this patient is homebound (i.e., Due to illness or injury, pt requires aid of supportive devices such as crutches, cane, wheelchairs, walkers, the use of special transportation or the assistance of another person to leave their place of residence. There is a normal inability to leave the home and doing so requires considerable and taxing effort. Other absences are for medical reasons / religious services and are infrequent or of short duration when for other reasons). Please direct any NON-WOUND related issues/requests for orders to patient's Primary Care Physician AIMA, MCWHIRT. ([id number]) I have recommended silver alginate and drawtex and a 3 layer Profore. She will continue with elevation and exercise and her lymphedema pumps and antibiotics as per Dr. Ola Spurr. Been changing her dressings twice a week. because of the holiday we'll see her back in 2 weeks Electronic Signature(s) Signed: 11/15/2016 5:17:07 PM By: Gretta Cool RN, BSN, Kim RN, BSN Signed: 11/18/2016 4:55:45 PM By: Christin Fudge MD, FACS Previous Signature: 11/15/2016 9:40:05 AM Version By: Christin Fudge MD, FACS Entered By: Gretta Cool RN, BSN, Kim on 11/15/2016 17:17:07 LAKETIA, VICKNAIR ([id number]) -------------------------------------------------------------------------------- [facility] Details Patient Name: JEANY, SEVILLE. Date of Service: 11/15/2016 Medical Record Number: [id number] Patient Account Number: [id number] Date of Birth/Sex: Oct 25, 1951 (65 y.o. 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 E66.01 Morbid (severe) obesity due to excess calories Facility Procedures CPT4 Code: [id number] Description: 11941 - DEB SUBQ TISSUE 20 SQ CM/< ICD-10 Description Diagnosis E11.622 Type 2 diabetes mellitus with other skin ulcer I89.0 Lymphedema, not elsewhere classified L97.222 Non-pressure chronic ulcer of left calf with fat l 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 Modifier: ayer exposed layer exposed Quantity: 1 Physician Procedures CPT4 Code: [id number] Description: 99213 - WC PHYS LEVEL 3 - EST PT ICD-10 Description Diagnosis E11.622 Type 2 diabetes mellitus with other skin ulcer I89.0 Lymphedema, not elsewhere classified L97.222 Non-pressure chronic ulcer of left calf with fat l 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 Modifier: ayer exposed layer exposed Quantity: 1 CPT4 Code: [id number] [place], LI Description: 11042 - WC PHYS SUBQ TISS 20 SQ CM ICD-10 Description Diagnosis E11.622 Type 2 diabetes mellitus with other skin ulcer I89.0 Lymphedema, not elsewhere classified L97.222 Non-pressure chronic ulcer of left calf with fat l NDA M. ([id number]) Modifier: ayer exposed Quantity: 1 Electronic Signature(s) Signed: 11/15/2016 9:40:24 AM By: Christin Fudge MD, FACS Entered By: Christin Fudge on 11/15/2016 09:40:24

## 2016-11-16 NOTE — Progress Notes (Signed)
Brooke Hopkins ([id number]) Visit Report for 11/15/2016 Arrival Information Details Patient Name: Brooke Hopkins, Brooke Hopkins. Date of Service: 11/15/2016 8:45 AM Medical Record Number: [id number] Patient Account Number: [id number] Date of Birth/Sex: 1951-01-12 (65 y.o. Female) Treating RN: Ahmed Prima Primary Care Physician: FITZGERALD, DAVID Other Clinician: Referring Physician: FITZGERALD, DAVID Treating Physician/Extender: Frann Rider in Treatment: 21 Visit Information History Since Last Visit All ordered tests and consults were completed: No Patient Arrived: Wheel Chair Added or deleted any medications: No Arrival Time: 08:56 Any new allergies or adverse reactions: No Accompanied By: son Had a fall or experienced change in No Transfer Assistance: [employer] activities of daily living that may affect Patient Lift risk of falls: Patient Identification Verified: Yes Signs or symptoms of abuse/neglect since last No Secondary Verification Process Yes visito Completed: Hospitalized since last visit: No Patient Requires Transmission- No Pain Present Now: No Based Precautions: Patient Has Alerts: No Electronic Signature(s) Signed: 11/15/2016 4:21:18 PM By: Alric Quan Entered By: Alric Quan on 11/15/2016 08:57:06 Brooke Hopkins ([id number]) -------------------------------------------------------------------------------- Encounter Discharge Information Details Patient Name: Brooke Hopkins. Date of Service: 11/15/2016 8:45 AM Medical Record Number: [id number] Patient Account Number: [id number] Date of Birth/Sex: 1951-06-24 (65 y.o. Female) Treating RN: Ahmed Prima Primary Care Physician: FITZGERALD, DAVID Other Clinician: Referring Physician: FITZGERALD, DAVID Treating Physician/Extender: Frann Rider in Treatment: 50 Encounter Discharge Information Items Discharge Pain Level: 0 Discharge Condition: Stable Ambulatory Status:  Wheelchair Discharge Destination: Home Transportation: Private Auto Accompanied By: son Schedule Follow-up Appointment: Yes Medication Reconciliation completed and provided to Patient/Care Yes Reginna Sermeno: Provided on Clinical Summary of Care: 11/15/2016 Form Type Recipient Paper Patient LB Electronic Signature(s) Signed: 11/15/2016 10:01:55 AM By: Ruthine Dose Entered By: Ruthine Dose on 11/15/2016 10:01:55 Brooke Hopkins ([id number]) -------------------------------------------------------------------------------- Lower Extremity Assessment Details Patient Name: Brooke Hopkins. Date of Service: 11/15/2016 8:45 AM Medical Record Number: [id number] Patient Account Number: [id number] Date of Birth/Sex: [birth year] (65 y.o. Female) Treating RN: Ahmed Prima Primary Care Physician: FITZGERALD, DAVID Other Clinician: Referring Physician: FITZGERALD, DAVID Treating Physician/Extender: Frann Rider in Treatment: 21 Edema Assessment Assessed: [Left: No] [Right: No] E[Left: dema] [Right: :] Calf Left: Right: Point of Measurement: 32 cm From Medial Instep 55.3 cm 50 cm Ankle Left: Right: Point of Measurement: 11 cm From Medial Instep 37.2 cm 31 cm Vascular Assessment Pulses: Posterior Tibial Dorsalis Pedis Palpable: [Left:Yes] [Right:Yes] Extremity colors, hair growth, and conditions: Extremity Color: [Left:Red] [Right:Red] Temperature of Extremity: [Left:Cool] [Right:Cool] Toe Nail Assessment Left: Right: Thick: Yes Yes Discolored: Yes Yes Deformed: Yes Yes Improper Length and Hygiene: Yes Yes Electronic Signature(s) Signed: 11/15/2016 4:21:18 PM By: Alric Quan Entered By: Alric Quan on 11/15/2016 09:08:00 Brooke Hopkins ([id number]) -------------------------------------------------------------------------------- Multi Wound Chart Details Patient Name: Brooke Hopkins. Date of Service: 11/15/2016 8:45 AM Medical Record Number:  [id number] Patient Account Number: [id number] Date of Birth/Sex: 1951/01/10 (65 y.o. Female) Treating RN: Ahmed Prima Primary Care Physician: FITZGERALD, DAVID Other Clinician: Referring Physician: FITZGERALD, DAVID Treating Physician/Extender: Frann Rider in Treatment: 21 Vital Signs Height(in): 67 Pulse(bpm): 68 Weight(lbs): 300 Blood Pressure 158/58 (mmHg): Body Mass Index(BMI): 47 Temperature(F): 97.7 Respiratory Rate 18 (breaths/min): Photos: [1:No Photos] [2:No Photos] [N/A:N/A] Wound Location: [1:Left Lower Leg - Circumfernential] [2:Right Lower Leg - Circumfernential] [N/A:N/A] Wounding Event: [1:Gradually Appeared] [2:Gradually Appeared] [N/A:N/A] Primary Etiology: [1:Lymphedema] [2:Lymphedema] [N/A:N/A] Date Acquired: [1:12/31/2015] [2:12/31/2015] [N/A:N/A] Weeks of Treatment: [1:21] [2:21] [N/A:N/A] Wound Status: [1:Open] [2:Open] [N/A:N/A] Clustered Wound: [1:No] [2:Yes] [N/A:N/A] Clustered Quantity: [1:N/A] [2:3] [N/A:N/A] Measurements L x W x  D 20x23x0.1 [2:11x10x0.1] [N/A:N/A] (cm) Area (cm) : [1:361.283] [2:86.394] [N/A:N/A] Volume (cm) : [1:36.128] [2:8.639] [N/A:N/A] % Reduction in Area: [1:-259.40%] [2:61.80%] [N/A:N/A] % Reduction in Volume: -79.70% [2:80.90%] [N/A:N/A] Classification: [1:Partial Thickness] [2:Partial Thickness] [N/A:N/A] Exudate Amount: [1:Large] [2:Large] [N/A:N/A] Exudate Type: [1:Serous] [2:Purulent] [N/A:N/A] Exudate Color: [1:amber] [2:yellow, brown, green] [N/A:N/A] Foul Odor After [1:Yes] [2:Yes] [N/A:N/A] Cleansing: Odor Anticipated Due to No [2:No] [N/A:N/A] Product Use: Wound Margin: [1:Flat and Intact] [2:Indistinct, nonvisible] [N/A:N/A] Granulation Amount: [1:Medium (34-66%)] [2:Medium (34-66%)] [N/A:N/A] Granulation Quality: [1:Red] [2:Red] [N/A:N/A] Necrotic Amount: [1:Medium (34-66%)] [2:Medium (34-66%)] [N/A:N/A] Exposed Structures: [N/A:N/A] Fascia: No Fascia: No Fat: No Fat: No Tendon: No Tendon:  No Muscle: No Muscle: No Joint: No Joint: No Bone: No Bone: No Limited to Skin Limited to Skin Breakdown Breakdown Epithelialization: Large (67-100%) Small (1-33%) N/A Periwound Skin Texture: Edema: Yes Edema: Yes N/A Excoriation: Yes Excoriation: Yes Induration: No Induration: No Callus: No Callus: No Crepitus: No Crepitus: No Fluctuance: No Fluctuance: No Friable: No Friable: No Rash: No Rash: No Scarring: No Scarring: No Periwound Skin Maceration: Yes Maceration: Yes N/A Moisture: Moist: Yes Moist: Yes Dry/Scaly: No Dry/Scaly: No Periwound Skin Color: Rubor: Yes Erythema: Yes N/A Atrophie Blanche: No Hemosiderin Staining: Yes Cyanosis: No Rubor: Yes Ecchymosis: No Atrophie Blanche: No Erythema: No Cyanosis: No Hemosiderin Staining: No Ecchymosis: No Mottled: No Mottled: No Pallor: No Pallor: No Erythema Location: N/A Circumferential N/A Temperature: No Abnormality No Abnormality N/A Tenderness on Yes Yes N/A Palpation: Wound Preparation: Ulcer Cleansing: Other: Ulcer Cleansing: Other: N/A soap and water soap and water Topical Anesthetic Topical Anesthetic Applied: Other: lidocaine Applied: Other: lidocaine 4% 4% Treatment Notes Electronic Signature(s) Signed: 11/15/2016 4:21:18 PM By: Alric Quan Entered By: Alric Quan on 11/15/2016 09:17:25 Brooke Hopkins ([id number]) -------------------------------------------------------------------------------- [place] Details Patient Name: KIMBERLI, WINNE. Date of Service: 11/15/2016 8:45 AM Medical Record Number: [id number] Patient Account Number: [id number] Date of Birth/Sex: 06/27/1951 (65 y.o. Female) Treating RN: Ahmed Prima Primary Care Physician: FITZGERALD, DAVID Other Clinician: Referring Physician: FITZGERALD, DAVID Treating Physician/Extender: Frann Rider in Treatment: 21 Active Inactive Orientation to the Wound Care Program Nursing  Diagnoses: Knowledge deficit related to the wound healing center program Goals: Patient/caregiver will verbalize understanding of the [place] Program Date Initiated: 06/21/2016 Goal Status: Active Interventions: Provide education on orientation to the wound center Notes: Wound/Skin Impairment Nursing Diagnoses: Impaired tissue integrity Goals: Patient/caregiver will verbalize understanding of skin care regimen Date Initiated: 06/21/2016 Goal Status: Active Ulcer/skin breakdown will have a volume reduction of 30% by week 4 Date Initiated: 06/21/2016 Goal Status: Active Ulcer/skin breakdown will have a volume reduction of 50% by week 8 Date Initiated: 06/21/2016 Goal Status: Active Ulcer/skin breakdown will have a volume reduction of 80% by week 12 Date Initiated: 06/21/2016 Goal Status: Active Ulcer/skin breakdown will heal within 14 weeks Date Initiated: 06/21/2016 Goal Status: Active MAKENZIE, WEISNER ([id number]) Interventions: Assess patient/caregiver ability to obtain necessary supplies Assess patient/caregiver ability to perform ulcer/skin care regimen upon admission and as needed Assess ulceration(s) every visit Provide education on ulcer and skin care Notes: Electronic Signature(s) Signed: 11/15/2016 4:21:18 PM By: Alric Quan Entered By: Alric Quan on 11/15/2016 09:17:07 Brooke Hopkins ([id number]) -------------------------------------------------------------------------------- Pain Assessment Details Patient Name: JANIECE, SCOVILL. Date of Service: 11/15/2016 8:45 AM Medical Record Number: [id number] Patient Account Number: [id number] Date of Birth/Sex: [birth year] (65 y.o. Female) Treating RN: Ahmed Prima Primary Care Physician: FITZGERALD, DAVID Other Clinician: Referring Physician: FITZGERALD, DAVID Treating Physician/Extender: Frann Rider in Treatment:  21 Active Problems Location of Pain Severity and Description of  Pain Patient Has Paino No Site Locations With Dressing Change: No Pain Management and Medication Current Pain Management: Electronic Signature(s) Signed: 11/15/2016 4:21:18 PM By: Alric Quan Entered By: Alric Quan on 11/15/2016 08:57:11 Brooke Hopkins ([id number]) -------------------------------------------------------------------------------- Patient/Caregiver Education Details Patient Name: TIMMIE, CALIX. Date of Service: 11/15/2016 8:45 AM Medical Record Number: [id number] Patient Account Number: [id number] Date of Birth/Gender: 04/12/1951 (65 y.o. Female) Treating RN: Ahmed Prima Primary Care Physician: FITZGERALD, DAVID Other Clinician: Referring Physician: FITZGERALD, DAVID Treating Physician/Extender: Frann Rider in Treatment: 21 Education Assessment Education Provided To: Patient Education Topics Provided Wound/Skin Impairment: Handouts: Other: do not get wraps wet, change dressing as ordered Methods: Demonstration, Explain/Verbal Responses: State content correctly Electronic Signature(s) Signed: 11/15/2016 4:21:18 PM By: Alric Quan Entered By: Alric Quan on 11/15/2016 09:37:02 Brooke Hopkins ([id number]) -------------------------------------------------------------------------------- Wound Assessment Details Patient Name: SHAKIRRA, BUEHLER. Date of Service: 11/15/2016 8:45 AM Medical Record Number: [id number] Patient Account Number: [id number] Date of Birth/Sex: Nov 18, 1951 (65 y.o. Female) Treating RN: Ahmed Prima Primary Care Physician: FITZGERALD, DAVID Other Clinician: Referring Physician: FITZGERALD, DAVID Treating Physician/Extender: Frann Rider in Treatment: 21 Wound Status Wound Number: 1 Primary Etiology: Lymphedema Wound Location: Left Lower Leg - Wound Status: Open Circumfernential Wounding Event: Gradually Appeared Date Acquired: 12/31/2015 Weeks Of Treatment: 21 Clustered Wound:  No Photos Photo Uploaded By: Alric Quan on 11/15/2016 10:40:52 Wound Measurements Length: (cm) 20 Width: (cm) 23 Depth: (cm) 0.1 Area: (cm) 361.283 Volume: (cm) 36.128 % Reduction in Area: -259.4% % Reduction in Volume: -79.7% Epithelialization: Large (67-100%) Tunneling: No Undermining: No Wound Description Classification: Partial Thickness Wound Margin: Flat and Intact Exudate Amount: Large Exudate Type: Serous Exudate Color: amber Foul Odor After Cleansing: Yes Due to Product Use: No Wound Bed Granulation Amount: Medium (34-66%) Exposed Structure Granulation Quality: Red Fascia Exposed: No Necrotic Amount: Medium (34-66%) Fat Layer Exposed: No AISLYNN, CIFELLI ([id number]) Necrotic Quality: Adherent Slough Tendon Exposed: No Muscle Exposed: No Joint Exposed: No Bone Exposed: No Limited to Skin Breakdown Periwound Skin Texture Texture Color No Abnormalities Noted: No No Abnormalities Noted: No Callus: No Atrophie Blanche: No Crepitus: No Cyanosis: No Excoriation: Yes Ecchymosis: No Fluctuance: No Erythema: No Friable: No Hemosiderin Staining: No Induration: No Mottled: No Localized Edema: Yes Pallor: No Rash: No Rubor: Yes Scarring: No Temperature / Pain Moisture Temperature: No Abnormality No Abnormalities Noted: No Tenderness on Palpation: Yes Dry / Scaly: No Maceration: Yes Moist: Yes Wound Preparation Ulcer Cleansing: Other: soap and water, Topical Anesthetic Applied: Other: lidocaine 4%, Treatment Notes Wound #1 (Left, Circumferential Lower Leg) 1. Cleansed with: Clean wound with Normal Saline Cleanse wound with antibacterial soap and water 2. Anesthetic Topical Lidocaine 4% cream to wound bed prior to debridement 4. Dressing Applied: Aquacel Ag Santyl Ointment 5. Secondary Dressing Applied ABD Pad 7. Secured with Tape 3 Layer Compression System - Bilateral Notes xtrasorb, unna to anchor DONESHIA, HILL  ([id number]) Electronic Signature(s) Signed: 11/15/2016 4:21:18 PM By: Alric Quan Entered By: Alric Quan on 11/15/2016 09:15:11 Brooke Hopkins ([id number]) -------------------------------------------------------------------------------- Wound Assessment Details Patient Name: PANSIE, GUGGISBERG. Date of Service: 11/15/2016 8:45 AM Medical Record Number: [id number] Patient Account Number: [id number] Date of Birth/Sex: May 01, 1951 (65 y.o. Female) Treating RN: Ahmed Prima Primary Care Physician: FITZGERALD, DAVID Other Clinician: Referring Physician: FITZGERALD, DAVID Treating Physician/Extender: Frann Rider in Treatment: 21 Wound Status Wound Number: 2 Primary Etiology: Lymphedema Wound Location: Right Lower Leg - Wound Status: Open  Circumfernential Wounding Event: Gradually Appeared Date Acquired: 12/31/2015 Weeks Of Treatment: 21 Clustered Wound: Yes Photos Photo Uploaded By: Alric Quan on 11/15/2016 10:40:52 Wound Measurements Length: (cm) 11 Width: (cm) 10 Depth: (cm) 0.1 Clustered Quantity: 3 Area: (cm) 86.394 Volume: (cm) 8.639 % Reduction in Area: 61.8% % Reduction in Volume: 80.9% Epithelialization: Small (1-33%) Tunneling: No Undermining: No Wound Description Classification: Partial Thickness Wound Margin: Indistinct, nonvisible Exudate Amount: Large Exudate Type: Purulent Exudate Color: yellow, brown, green Foul Odor After Cleansing: Yes Due to Product Use: No Wound Bed Granulation Amount: Medium (34-66%) Exposed Structure Granulation Quality: Red Fascia Exposed: No JEREMIAH, TARPLEY ([id number]) Necrotic Amount: Medium (34-66%) Fat Layer Exposed: No Necrotic Quality: Adherent Slough Tendon Exposed: No Muscle Exposed: No Joint Exposed: No Bone Exposed: No Limited to Skin Breakdown Periwound Skin Texture Texture Color No Abnormalities Noted: No No Abnormalities Noted: No Callus: No Atrophie Blanche:  No Crepitus: No Cyanosis: No Excoriation: Yes Ecchymosis: No Fluctuance: No Erythema: Yes Friable: No Erythema Location: Circumferential Induration: No Hemosiderin Staining: Yes Localized Edema: Yes Mottled: No Rash: No Pallor: No Scarring: No Rubor: Yes Moisture Temperature / Pain No Abnormalities Noted: No Temperature: No Abnormality Dry / Scaly: No Tenderness on Palpation: Yes Maceration: Yes Moist: Yes Wound Preparation Ulcer Cleansing: Other: soap and water, Topical Anesthetic Applied: Other: lidocaine 4%, Treatment Notes Wound #2 (Right, Circumferential Lower Leg) 1. Cleansed with: Clean wound with Normal Saline Cleanse wound with antibacterial soap and water 2. Anesthetic Topical Lidocaine 4% cream to wound bed prior to debridement 4. Dressing Applied: Aquacel Ag Santyl Ointment 5. Secondary Dressing Applied ABD Pad 7. Secured with Tape 3 Layer Compression System - Bilateral Notes xtrasorb, unna to anchor KHRISTIAN, SEALS ([id number]) Electronic Signature(s) Signed: 11/15/2016 4:21:18 PM By: Alric Quan Entered By: Alric Quan on 11/15/2016 09:15:21 Brooke Hopkins ([id number]) -------------------------------------------------------------------------------- Vitals Details Patient Name: SHELLE, GALDAMEZ. Date of Service: 11/15/2016 8:45 AM Medical Record Number: [id number] Patient Account Number: [id number] Date of Birth/Sex: 08-26-1951 (65 y.o. Female) Treating RN: Ahmed Prima Primary Care Physician: FITZGERALD, DAVID Other Clinician: Referring Physician: FITZGERALD, DAVID Treating Physician/Extender: Frann Rider in Treatment: 21 Vital Signs Time Taken: 08:57 Temperature (F): 97.7 Height (in): 67 Pulse (bpm): 68 Weight (lbs): 300 Respiratory Rate (breaths/min): 18 Body Mass Index (BMI): 47 Blood Pressure (mmHg): 158/58 Reference Range: 80 - 120 mg / dl Electronic Signature(s) Signed: 11/15/2016 4:21:18 PM  By: Alric Quan Entered By: Alric Quan on 11/15/2016 09:00:41

## 2016-11-22 ENCOUNTER — Ambulatory Visit: Payer: 59 | Admitting: Surgery

## 2016-11-28 DIAGNOSIS — E1165 Type 2 diabetes mellitus with hyperglycemia: Secondary | ICD-10-CM | POA: Diagnosis not present

## 2016-11-28 DIAGNOSIS — I890 Lymphedema, not elsewhere classified: Secondary | ICD-10-CM | POA: Diagnosis not present

## 2016-11-28 DIAGNOSIS — E113313 Type 2 diabetes mellitus with moderate nonproliferative diabetic retinopathy with macular edema, bilateral: Secondary | ICD-10-CM | POA: Diagnosis not present

## 2016-11-28 DIAGNOSIS — E1142 Type 2 diabetes mellitus with diabetic polyneuropathy: Secondary | ICD-10-CM | POA: Diagnosis not present

## 2016-11-28 DIAGNOSIS — L97909 Non-pressure chronic ulcer of unspecified part of unspecified lower leg with unspecified severity: Secondary | ICD-10-CM | POA: Diagnosis not present

## 2016-11-28 DIAGNOSIS — I10 Essential (primary) hypertension: Secondary | ICD-10-CM | POA: Diagnosis not present

## 2016-11-28 DIAGNOSIS — Z794 Long term (current) use of insulin: Secondary | ICD-10-CM | POA: Diagnosis not present

## 2016-11-28 DIAGNOSIS — E1129 Type 2 diabetes mellitus with other diabetic kidney complication: Secondary | ICD-10-CM | POA: Diagnosis not present

## 2016-11-29 ENCOUNTER — Encounter: Payer: 59 | Attending: Nurse Practitioner | Admitting: Nurse Practitioner

## 2016-11-29 DIAGNOSIS — L97212 Non-pressure chronic ulcer of right calf with fat layer exposed: Secondary | ICD-10-CM | POA: Diagnosis not present

## 2016-11-29 DIAGNOSIS — I10 Essential (primary) hypertension: Secondary | ICD-10-CM | POA: Insufficient documentation

## 2016-11-29 DIAGNOSIS — E11622 Type 2 diabetes mellitus with other skin ulcer: Secondary | ICD-10-CM | POA: Diagnosis not present

## 2016-11-29 DIAGNOSIS — Z87891 Personal history of nicotine dependence: Secondary | ICD-10-CM | POA: Diagnosis not present

## 2016-11-29 DIAGNOSIS — E1140 Type 2 diabetes mellitus with diabetic neuropathy, unspecified: Secondary | ICD-10-CM | POA: Insufficient documentation

## 2016-11-29 DIAGNOSIS — Z6841 Body Mass Index (BMI) 40.0 and over, adult: Secondary | ICD-10-CM | POA: Insufficient documentation

## 2016-11-29 DIAGNOSIS — L97222 Non-pressure chronic ulcer of left calf with fat layer exposed: Secondary | ICD-10-CM | POA: Insufficient documentation

## 2016-11-29 DIAGNOSIS — I890 Lymphedema, not elsewhere classified: Secondary | ICD-10-CM | POA: Diagnosis not present

## 2016-12-01 NOTE — Progress Notes (Signed)
[name], [name] ([id number]) Visit Report for 11/29/2016 Arrival Information Details Patient Name: Brooke Hopkins, Brooke Hopkins. Date of Service: 11/29/2016 8:45 AM Medical Record Number: [id number] Patient Account Number: [id number] Date of Birth/Sex: 1951/01/25 (65 y.o. Female) Treating RN: Ahmed Prima Primary Care Physician: FITZGERALD, DAVID Other Clinician: Referring Physician: FITZGERALD, DAVID Treating Physician/Extender: Cathie Olden in Treatment: 23 Visit Information History Since Last Visit All ordered tests and consults were completed: No Patient Arrived: Wheel Chair Added or deleted any medications: No Arrival Time: 08:49 Any new allergies or adverse reactions: No Accompanied By: husband Had Brooke Hopkins fall or experienced change in No Transfer Assistance: EasyP[employer] activities of daily living that may affect Patient Lift risk of falls: Patient Identification Verified: Yes Signs or symptoms of abuse/neglect since last No Secondary Verification Process Yes visito Completed: Hospitalized since last visit: No Patient Requires Transmission- No Pain Present Now: No Based Precautions: Patient Has Alerts: No Electronic Signature(s) Signed: 11/29/2016 4:53:54 PM By: Alric Quan Entered By: Alric Quan on 11/29/2016 08:49:20 Brooke Hopkins ([id number]) -------------------------------------------------------------------------------- Encounter Discharge Information Details Patient Name: Brooke Hopkins. Date of Service: 11/29/2016 8:45 AM Medical Record Number: [id number] Patient Account Number: [id number] Date of Birth/Sex: 22-Aug-1951 (65 y.o. Female) Treating RN: Ahmed Prima Primary Care Physician: FITZGERALD, DAVID Other Clinician: Referring Physician: FITZGERALD, DAVID Treating Physician/Extender: Cathie Olden in Treatment: 23 Encounter Discharge Information Items Discharge Pain Level: 0 Discharge Condition: Stable Ambulatory Status:  Wheelchair Discharge Destination: Home Transportation: Private Auto Accompanied By: husband Schedule Follow-up Appointment: Yes Medication Reconciliation completed and provided to Patient/Care Yes Kapena Hamme: Provided on Clinical Summary of Care: 11/29/2016 Form Type Recipient Paper Patient LB Electronic Signature(s) Signed: 11/29/2016 9:52:21 AM By: Sharon Mt Entered By: Sharon Mt on 11/29/2016 09:52:21 Brooke Hopkins ([id number]) -------------------------------------------------------------------------------- Lower Extremity Assessment Details Patient Name: Brooke Hopkins. Date of Service: 11/29/2016 8:45 AM Medical Record Number: [id number] Patient Account Number: [id number] Date of Birth/Sex: 06/02/1951 (64 y.o. Female) Treating RN: Ahmed Prima Primary Care Physician: FITZGERALD, DAVID Other Clinician: Referring Physician: FITZGERALD, DAVID Treating Physician/Extender: Lawanda Cousins Weeks in Treatment: 23 Edema Assessment Assessed: [Left: No] [Right: No] E[Left: dema] [Right: :] Calf Left: Right: Point of Measurement: 32 cm From Medial Instep 55 cm 50 cm Ankle Left: Right: Point of Measurement: 11 cm From Medial Instep 33.6 cm 31.2 cm Vascular Assessment Pulses: Posterior Tibial Dorsalis Pedis Palpable: [Left:Yes] [Right:Yes] Extremity colors, hair growth, and conditions: Extremity Color: [Left:Red] [Right:Red] Temperature of Extremity: [Left:Cool] [Right:Cool] Capillary Refill: [Left:< 3 seconds] [Right:< 3 seconds] Toe Nail Assessment Left: Right: Thick: Yes Yes Discolored: Yes Yes Deformed: Yes Yes Improper Length and Hygiene: Yes Yes Electronic Signature(s) Signed: 11/29/2016 4:53:54 PM By: Alric Quan Entered By: Alric Quan on 11/29/2016 09:01:09 Brooke Hopkins ([id number]) -------------------------------------------------------------------------------- Multi Wound Chart Details Patient Name: Brooke Hopkins. Date of  Service: 11/29/2016 8:45 AM Medical Record Number: [id number] Patient Account Number: [id number] Date of Birth/Sex: 1951-04-10 (65 y.o. Female) Treating RN: Ahmed Prima Primary Care Physician: FITZGERALD, DAVID Other Clinician: Referring Physician: FITZGERALD, DAVID Treating Physician/Extender: Lawanda Cousins Weeks in Treatment: 23 Vital Signs Height(in): 67 Pulse(bpm): 75 Weight(lbs): 300 Blood Pressure 181/54 (mmHg): Body Mass Index(BMI): 47 Temperature(F): 97.8 Respiratory Rate 18 (breaths/min): Photos: [1:No Photos] [2:No Photos] [N/Brooke Hopkins:N/Brooke Hopkins] Wound Location: [1:Left Lower Leg - Circumfernential] [2:Right Lower Leg - Circumfernential] [N/Brooke Hopkins:N/Brooke Hopkins] Wounding Event: [1:Gradually Appeared] [2:Gradually Appeared] [N/Brooke Hopkins:N/Brooke Hopkins] Primary Etiology: [1:Lymphedema] [2:Lymphedema] [N/Brooke Hopkins:N/Brooke Hopkins] Date Acquired: [1:12/31/2015] [2:12/31/2015] [N/Brooke Hopkins:N/Brooke Hopkins] Weeks of Treatment: [1:23] [2:23] [N/Brooke Hopkins:N/Brooke Hopkins] Wound Status: [1:Open] [2:Open] [N/Brooke Hopkins:N/Brooke Hopkins] Clustered Wound: [1:No] [2:Yes] [N/Brooke Hopkins:N/Brooke Hopkins] Clustered Quantity: [  1:N/Brooke Hopkins] [2:3] [N/Brooke Hopkins:N/Brooke Hopkins] Measurements L x W x D 8.5x13x0.1 [2:9.5x16x0.2] [N/Brooke Hopkins:N/Brooke Hopkins] (cm) Area (cm) : [1:86.786] [2:119.381] [N/Brooke Hopkins:N/Brooke Hopkins] Volume (cm) : [1:8.679] [2:23.876] [N/Brooke Hopkins:N/Brooke Hopkins] % Reduction in Area: [1:13.70%] [2:47.20%] [N/Brooke Hopkins:N/Brooke Hopkins] % Reduction in Volume: 56.80% [2:47.20%] [N/Brooke Hopkins:N/Brooke Hopkins] Classification: [1:Partial Thickness] [2:Partial Thickness] [N/Brooke Hopkins:N/Brooke Hopkins] Exudate Amount: [1:Large] [2:Large] [N/Brooke Hopkins:N/Brooke Hopkins] Exudate Type: [1:Serous] [2:Serous] [N/Brooke Hopkins:N/Brooke Hopkins] Exudate Color: [1:amber] [2:amber] [N/Brooke Hopkins:N/Brooke Hopkins] Foul Odor After [1:Yes] [2:Yes] [N/Brooke Hopkins:N/Brooke Hopkins] Cleansing: Odor Anticipated Due to No [2:No] [N/Brooke Hopkins:N/Brooke Hopkins] Product Use: Wound Margin: [1:Flat and Intact] [2:Indistinct, nonvisible] [N/Brooke Hopkins:N/Brooke Hopkins] Granulation Amount: [1:Medium (34-66%)] [2:Medium (34-66%)] [N/Brooke Hopkins:N/Brooke Hopkins] Granulation Quality: [1:Red] [2:Red] [N/Brooke Hopkins:N/Brooke Hopkins] Necrotic Amount: [1:Medium (34-66%)] [2:Medium (34-66%)] [N/Brooke Hopkins:N/Brooke Hopkins] Exposed Structures: [N/Brooke Hopkins:N/Brooke Hopkins] Fascia: No Fascia: No Fat:  No Fat: No Tendon: No Tendon: No Muscle: No Muscle: No Joint: No Joint: No Bone: No Bone: No Limited to Skin Limited to Skin Breakdown Breakdown Epithelialization: Large (67-100%) Small (1-33%) N/Brooke Hopkins Periwound Skin Texture: Edema: Yes Edema: Yes N/Brooke Hopkins Excoriation: Yes Excoriation: Yes Induration: No Induration: No Callus: No Callus: No Crepitus: No Crepitus: No Fluctuance: No Fluctuance: No Friable: No Friable: No Rash: No Rash: No Scarring: No Scarring: No Periwound Skin Maceration: Yes Maceration: Yes N/Brooke Hopkins Moisture: Moist: Yes Moist: Yes Dry/Scaly: No Dry/Scaly: No Periwound Skin Color: Rubor: Yes Erythema: Yes N/Brooke Hopkins Atrophie Blanche: No Hemosiderin Staining: Yes Cyanosis: No Rubor: Yes Ecchymosis: No Atrophie Blanche: No Erythema: No Cyanosis: No Hemosiderin Staining: No Ecchymosis: No Mottled: No Mottled: No Pallor: No Pallor: No Erythema Location: N/Brooke Hopkins Circumferential N/Brooke Hopkins Temperature: No Abnormality No Abnormality N/Brooke Hopkins Tenderness on Yes Yes N/Brooke Hopkins Palpation: Wound Preparation: Ulcer Cleansing: Other: Ulcer Cleansing: Other: N/Brooke Hopkins soap and water soap and water Topical Anesthetic Topical Anesthetic Applied: Other: lidocaine Applied: Other: lidocaine 4% 4% Treatment Notes Electronic Signature(s) Signed: 11/29/2016 4:53:54 PM By: Alric Quan Entered By: Alric Quan on 11/29/2016 09:14:13 Brooke Hopkins ([id number]) -------------------------------------------------------------------------------- Multi-Disciplinary Care Plan Details Patient Name: Brooke Hopkins, Brooke Hopkins. Date of Service: 11/29/2016 8:45 AM Medical Record Number: [id number] Patient Account Number: [id number] Date of Birth/Sex: 03-18-1951 (65 y.o. Female) Treating RN: Ahmed Prima Primary Care Physician: FITZGERALD, DAVID Other Clinician: Referring Physician: FITZGERALD, DAVID Treating Physician/Extender: Cathie Olden in Treatment: 33 Active Inactive Orientation to the  Wound Care Program Nursing Diagnoses: Knowledge deficit related to the wound healing center program Goals: Patient/caregiver will verbalize understanding of the [place] Program Date Initiated: 06/21/2016 Goal Status: Active Interventions: Provide education on orientation to the wound center Notes: Wound/Skin Impairment Nursing Diagnoses: Impaired tissue integrity Goals: Patient/caregiver will verbalize understanding of skin care regimen Date Initiated: 06/21/2016 Goal Status: Active Ulcer/skin breakdown will have Brooke Hopkins volume reduction of 30% by week 4 Date Initiated: 06/21/2016 Goal Status: Active Ulcer/skin breakdown will have Brooke Hopkins volume reduction of 50% by week 8 Date Initiated: 06/21/2016 Goal Status: Active Ulcer/skin breakdown will have Brooke Hopkins volume reduction of 80% by week 12 Date Initiated: 06/21/2016 Goal Status: Active Ulcer/skin breakdown will heal within 14 weeks Date Initiated: 06/21/2016 Goal Status: Active Brooke Hopkins, Brooke Hopkins ([id number]) Interventions: Assess patient/caregiver ability to obtain necessary supplies Assess patient/caregiver ability to perform ulcer/skin care regimen upon admission and as needed Assess ulceration(s) every visit Provide education on ulcer and skin care Notes: Electronic Signature(s) Signed: 11/29/2016 4:53:54 PM By: Alric Quan Entered By: Alric Quan on 11/29/2016 09:14:08 Brooke Hopkins ([id number]) -------------------------------------------------------------------------------- Pain Assessment Details Patient Name: Brooke Hopkins, Brooke Hopkins. Date of Service: 11/29/2016 8:45 AM Medical Record Number: [id number] Patient Account Number: [id number] Date of Birth/Sex: [birth year] (65 y.o. Female) Treating RN: Ahmed Prima Primary Care Physician: FITZGERALD, DAVID Other Clinician: Referring Physician: [place], DAVID Treating  Physician/Extender: Lawanda Cousins Weeks in Treatment: 23 Active Problems Location of Pain  Severity and Description of Pain Patient Has Paino No Site Locations With Dressing Change: No Pain Management and Medication Current Pain Management: Electronic Signature(s) Signed: 11/29/2016 4:53:54 PM By: Alric Quan Entered By: Alric Quan on 11/29/2016 08:49:25 Brooke Hopkins ([id number]) -------------------------------------------------------------------------------- Patient/Caregiver Education Details Patient Name: Brooke Hopkins, Brooke Hopkins. Date of Service: 11/29/2016 8:45 AM Medical Record Number: [id number] Patient Account Number: [id number] Date of Birth/Gender: 1951-06-30 (65 y.o. Female) Treating RN: Ahmed Prima Primary Care Physician: FITZGERALD, DAVID Other Clinician: Referring Physician: FITZGERALD, DAVID Treating Physician/Extender: Cathie Olden in Treatment: 24 Education Assessment Education Provided To: Patient Education Topics Provided Wound/Skin Impairment: Handouts: Other: change dressing as ordered, do not get wrap wet Methods: Demonstration, Explain/Verbal Responses: State content correctly Electronic Signature(s) Signed: 11/29/2016 4:53:54 PM By: Alric Quan Entered By: Alric Quan on 11/29/2016 09:22:21 Brooke Hopkins ([id number]) -------------------------------------------------------------------------------- Wound Assessment Details Patient Name: Brooke Hopkins, Brooke Hopkins. Date of Service: 11/29/2016 8:45 AM Medical Record Number: [id number] Patient Account Number: [id number] Date of Birth/Sex: Sep 08, 1951 (65 y.o. Female) Treating RN: Ahmed Prima Primary Care Physician: FITZGERALD, DAVID Other Clinician: Referring Physician: FITZGERALD, DAVID Treating Physician/Extender: Lawanda Cousins Weeks in Treatment: 23 Wound Status Wound Number: 1 Primary Etiology: Lymphedema Wound Location: Left Lower Leg - Wound Status: Open Circumfernential Wounding Event: Gradually Appeared Date Acquired: 12/31/2015 Weeks Of Treatment:  23 Clustered Wound: No Photos Photo Uploaded By: Alric Quan on 11/29/2016 09:16:14 Wound Measurements Length: (cm) 8.5 Width: (cm) 13 Depth: (cm) 0.1 Area: (cm) 86.786 Volume: (cm) 8.679 % Reduction in Area: 13.7% % Reduction in Volume: 56.8% Epithelialization: Large (67-100%) Tunneling: No Undermining: No Wound Description Classification: Partial Thickness Wound Margin: Flat and Intact Exudate Amount: Large Exudate Type: Serous Exudate Color: amber Foul Odor After Cleansing: Yes Due to Product Use: No Wound Bed Granulation Amount: Medium (34-66%) Exposed Structure Granulation Quality: Red Fascia Exposed: No Necrotic Amount: Medium (34-66%) Fat Layer Exposed: No Brooke Hopkins, Brooke Hopkins ([id number]) Necrotic Quality: Adherent Slough Tendon Exposed: No Muscle Exposed: No Joint Exposed: No Bone Exposed: No Limited to Skin Breakdown Periwound Skin Texture Texture Color No Abnormalities Noted: No No Abnormalities Noted: No Callus: No Atrophie Blanche: No Crepitus: No Cyanosis: No Excoriation: Yes Ecchymosis: No Fluctuance: No Erythema: No Friable: No Hemosiderin Staining: No Induration: No Mottled: No Localized Edema: Yes Pallor: No Rash: No Rubor: Yes Scarring: No Temperature / Pain Moisture Temperature: No Abnormality No Abnormalities Noted: No Tenderness on Palpation: Yes Dry / Scaly: No Maceration: Yes Moist: Yes Wound Preparation Ulcer Cleansing: Other: soap and water, Topical Anesthetic Applied: Other: lidocaine 4%, Treatment Notes Wound #1 (Left, Circumferential Lower Leg) 1. Cleansed with: Clean wound with Normal Saline Cleanse wound with antibacterial soap and water 2. Anesthetic Topical Lidocaine 4% cream to wound bed prior to debridement 4. Dressing Applied: Aquacel Ag Santyl Ointment 5. Secondary Dressing Applied ABD Pad 7. Secured with Tape 3 Layer Compression System - Bilateral Notes xtrasorb RAMSEY, MIDGETT  ([id number]) Electronic Signature(s) Signed: 11/29/2016 4:53:54 PM By: Alric Quan Entered By: Alric Quan on 11/29/2016 09:10:53 Brooke Hopkins ([id number]) -------------------------------------------------------------------------------- Wound Assessment Details Patient Name: Brooke Hopkins, Brooke Hopkins. Date of Service: 11/29/2016 8:45 AM Medical Record Number: [id number] Patient Account Number: [id number] Date of Birth/Sex: [birth year] (65 y.o. Female) Treating RN: Ahmed Prima Primary Care Physician: FITZGERALD, DAVID Other Clinician: Referring Physician: FITZGERALD, DAVID Treating Physician/Extender: Lawanda Cousins Weeks in Treatment: 23 Wound Status Wound Number: 2 Primary Etiology: Lymphedema Wound Location: Right Lower Leg -  Wound Status: Open Circumfernential Wounding Event: Gradually Appeared Date Acquired: 12/31/2015 Weeks Of Treatment: 23 Clustered Wound: Yes Photos Photo Uploaded By: Alric Quan on 11/29/2016 09:16:15 Wound Measurements Length: (cm) 9.5 Width: (cm) 16 Depth: (cm) 0.2 Clustered Quantity: 3 Area: (cm) 119.381 Volume: (cm) 23.876 % Reduction in Area: 47.2% % Reduction in Volume: 47.2% Epithelialization: Small (1-33%) Tunneling: No Undermining: No Wound Description Classification: Partial Thickness Wound Margin: Indistinct, nonvisible Exudate Amount: Large Exudate Type: Serous Exudate Color: amber Foul Odor After Cleansing: Yes Due to Product Use: No Wound Bed Granulation Amount: Medium (34-66%) Exposed Structure Granulation Quality: Red Fascia Exposed: No Brooke Hopkins, Brooke Hopkins ([id number]) Necrotic Amount: Medium (34-66%) Fat Layer Exposed: No Necrotic Quality: Adherent Slough Tendon Exposed: No Muscle Exposed: No Joint Exposed: No Bone Exposed: No Limited to Skin Breakdown Periwound Skin Texture Texture Color No Abnormalities Noted: No No Abnormalities Noted: No Callus: No Atrophie Blanche: No Crepitus: No Cyanosis:  No Excoriation: Yes Ecchymosis: No Fluctuance: No Erythema: Yes Friable: No Erythema Location: Circumferential Induration: No Hemosiderin Staining: Yes Localized Edema: Yes Mottled: No Rash: No Pallor: No Scarring: No Rubor: Yes Moisture Temperature / Pain No Abnormalities Noted: No Temperature: No Abnormality Dry / Scaly: No Tenderness on Palpation: Yes Maceration: Yes Moist: Yes Wound Preparation Ulcer Cleansing: Other: soap and water, Topical Anesthetic Applied: Other: lidocaine 4%, Treatment Notes Wound #2 (Right, Circumferential Lower Leg) 1. Cleansed with: Clean wound with Normal Saline Cleanse wound with antibacterial soap and water 2. Anesthetic Topical Lidocaine 4% cream to wound bed prior to debridement 4. Dressing Applied: Aquacel Ag Santyl Ointment 5. Secondary Dressing Applied ABD Pad 7. Secured with Tape 3 Layer Compression System - Bilateral Notes xtrasorb Brooke Hopkins, Brooke Hopkins ([id number]) Electronic Signature(s) Signed: 11/29/2016 4:53:54 PM By: Alric Quan Entered By: Alric Quan on 11/29/2016 09:11:52 Brooke Hopkins ([id number]) -------------------------------------------------------------------------------- [place] Details Patient Name: AKEIRA, LAHM. Date of Service: 11/29/2016 8:45 AM Medical Record Number: [id number] Patient Account Number: [id number] Date of Birth/Sex: 11-Jan-1951 (65 y.o. Female) Treating RN: Ahmed Prima Primary Care Physician: FITZGERALD, DAVID Other Clinician: Referring Physician: FITZGERALD, DAVID Treating Physician/Extender: Cathie Olden in Treatment: 23 Vital Signs Time Taken: 08:49 Temperature (F): 97.8 Height (in): 67 Pulse (bpm): 75 Weight (lbs): 300 Respiratory Rate (breaths/min): 18 Body Mass Index (BMI): 47 Blood Pressure (mmHg): 181/54 Reference Range: 80 - 120 mg / dl Electronic Signature(s) Signed: 11/29/2016 4:53:54 PM By: Alric Quan Entered By: Alric Quan on 11/29/2016 09:03:47

## 2016-12-01 NOTE — Progress Notes (Signed)
Brooke Hopkins ([id number]) Visit Report for 11/29/2016 Chief Complaint Document Details Patient Name: Brooke Hopkins, Brooke Hopkins. Date of Service: 11/29/2016 8:45 AM Medical Record Number: [id number] Patient Account Number: [id number] Date of Birth/Sex: 1951/02/04 (65 y.o. Female) Treating RN: Ahmed Prima Primary Care Physician: FITZGERALD, DAVID Other Clinician: Referring Physician: FITZGERALD, DAVID Treating Physician/Extender: Cathie Olden in Treatment: 23 Information Obtained from: Patient Chief Complaint Mrs. Alderman returns for follow-up to her bilateral lower extremity ulcers Electronic Signature(s) Signed: 11/29/2016 10:03:24 AM By: Rene Kocher, NP, Desarae Placide Previous Signature: 11/29/2016 10:03:03 AM Version By: Rene Kocher, NP, Olden Klauer Entered By: Rene Kocher, NP, Yon Schiffman on 11/29/2016 10:03:24 [name] ([id number]) -------------------------------------------------------------------------------- Debridement Details Patient Name: Brooke Hopkins. Date of Service: 11/29/2016 8:45 AM Medical Record Number: [id number] Patient Account Number: [id number] Date of Birth/Sex: Jan 21, 1951 (65 y.o. Female) Treating RN: Ahmed Prima Primary Care Physician: FITZGERALD, DAVID Other Clinician: Referring Physician: FITZGERALD, DAVID Treating Physician/Extender: Cathie Olden in Treatment: 23 Debridement Performed for Wound #1 Left,Circumferential Lower Leg Assessment: Performed By: Physician Lawanda Cousins, NP Debridement: Open Wound/Selective Debridement Selective Description: Pre-procedure Yes - 09:25 Verification/Time Out Taken: Start Time: 09:26 Pain Control: Lidocaine 4% Topical Solution Total Area Debrided (L x 5 (cm) x 1.5 (cm) = 7.5 (cm) W): Tissue and other Viable, Non-Viable, Fibrin/Slough material debrided: Instrument: Curette Bleeding: Minimum Hemostasis Achieved: Pressure End Time: 09:28 Procedural Pain: 0 Post Procedural Pain: 0 Response to Treatment:  Procedure was tolerated well Post Debridement Measurements of Total Wound Length: (cm) 5 Width: (cm) 1.5 Depth: (cm) 0.2 Volume: (cm) 1.178 Character of Wound/Ulcer Post Requires Further Debridement Debridement: Severity of Tissue Post Debridement: Fat layer exposed Post Procedure Diagnosis Same as Pre-procedure Electronic Signature(s) Signed: 11/29/2016 10:02:30 AM By: Rene Kocher, NP, Hyland Mollenkopf Signed: 11/29/2016 4:53:54 PM By: Alric Quan Entered By: Rene Kocher, NP, Paula Busenbark on 11/29/2016 10:02:30 ERCELL, PERLMAN ([name], [name] ([id number]) -------------------------------------------------------------------------------- Debridement Details Patient Name: Brooke Hopkins. Date of Service: 11/29/2016 8:45 AM Medical Record Number: [id number] Patient Account Number: [id number] Date of Birth/Sex: 1951/09/14 (65 y.o. Female) Treating RN: Ahmed Prima Primary Care Physician: FITZGERALD, DAVID Other Clinician: Referring Physician: FITZGERALD, DAVID Treating Physician/Extender: Cathie Olden in Treatment: 23 Debridement Performed for Wound #2 Right,Circumferential Lower Leg Assessment: Performed By: Physician Lawanda Cousins, NP Debridement: Open Wound/Selective Debridement Selective Description: Pre-procedure Yes - 09:25 Verification/Time Out Taken: Start Time: 09:28 Pain Control: Lidocaine 4% Topical Solution Total Area Debrided (L x 2 (cm) x 2 (cm) = 4 (cm) W): Tissue and other Viable, Non-Viable, Fibrin/Slough material debrided: Instrument: Curette Bleeding: Minimum Hemostasis Achieved: Pressure End Time: 09:30 Procedural Pain: 0 Post Procedural Pain: 0 Response to Treatment: Procedure was tolerated well Post Debridement Measurements of Total Wound Length: (cm) 2 Width: (cm) 2 Depth: (cm) 0.2 Volume: (cm) 0.628 Character of Wound/Ulcer Post Requires Further Debridement Debridement: Severity of Tissue Post Debridement: Fat layer  exposed Post Procedure Diagnosis Same as Pre-procedure Electronic Signature(s) Signed: 11/29/2016 10:02:45 AM By: Rene Kocher, NP, Gracieann Stannard Signed: 11/29/2016 4:53:54 PM By: Alric Quan Entered By: Rene Kocher, NP, Julianah Marciel on 11/29/2016 10:02:44 KALLIOPI, COUPLAND ([name], [name] ([id number]) -------------------------------------------------------------------------------- HPI Details Patient Name: ZHANA, Hopkins. Date of Service: 11/29/2016 8:45 AM Medical Record Number: [id number] Patient Account Number: [id number] Date of Birth/Sex: 10/17/1951 (65 y.o. Female) Treating RN: Ahmed Prima Primary Care Physician: FITZGERALD, DAVID Other Clinician: Referring Physician: FITZGERALD, DAVID Treating Physician/Extender: Cathie Olden in Treatment: 23 History of Present Illness Location: massive swelling of both lower extremities and ulceration both lower extremities Quality: Patient reports experiencing  a dull pain to affected area(s). Severity: Patient states wound are getting worse. Duration: Patient has had the wound for >2 years prior to seeking treatment at the wound center Timing: Pain in wound is constant (hurts all the time) Context: The wound appeared gradually over time Modifying Factors: Other treatment(s) tried include:she has a lymphedema pump but uses it seldom and she's had several course of antibiotics Associated Signs and Symptoms: Patient reports having difficulty standing for long periods. HPI Description: 65 year old patient seen by Dr. Ola Spurr of infectious disease who has been following up for left lower extremity cellulitis and ulcer with recurrent bilateral lower extremity problems for several months. Recently she had a large right lower extremity bullae which opened out and has been ulcerated. She has seen the vascular group and has been getting Unna's wraps and has a lymphedema pump used in the past. Her prior cultures were positive for  Pseudomonas, Proteus and was treated with amoxicillin. He has also been treated with 2 weeks course of ciprofloxacin and amoxicillin.. Increase of Lasix dose helped with the edema and echo showed no systolic CHF but may be diastolic problems. Past medical history significant for diabetes mellitus type 2, venous stasis ulcer, obesity, diabetic peripheral neuropathy, status post back surgery, cholecystectomy, hysterectomy, arthroscopy of the knee. He is a former smoker and quit smoking in 1984. The patient has seen Dr. Delana Meyer who did not recommend any arterial or venous duplex studies and has been using Unna's wraps and also recommended a lymphedema pump. she has been very noncompliant with using these. 06/28/2016 -- the patient is still on antibiotics as prescribed by Dr. Ola Spurr and he is asked her to take it for 3 weeks. The patient also says she has a lot of redness and pain in the folds of her thigh and lower extremity and this is very painful. 07/26/2016 -- the patient is off antibiotics and has been getting dressing changes 3 times a week. 08/09/2016 -- is been on Cipro and is taking potassium supplements along with her Lasix and is going to be seeing Dr. Ola Spurr for a consult return visit only in November 2017. 08/23/2016 -- she was admitted to the hospital last week and I have reviewed these reports in detail. She was seen by Dr. Ola Spurr who noted a Pseudomonas infection which was resistant to ciprofloxacin and discharged her on Ceftazidime 1 g IV every 12 hourly anyone days. The antibiotic was to be stopped on September 11 and he would see her back in the clinic. 08/30/2016 -- had a communication from Dr. Ola Spurr that he would extend her antibiotics by a week if she continued to look like cellulitis was persisting. CHARRISE, LARDNER ([id number]) 09/13/2016 -- the PICC line is out and antibiotics have stopped. 10/04/16: returns today for f/u. reports utilizes compression  pump twice daily. she is compliant with her compression therapy. she reports a new blister on the right proximal posterior calf. no systemic s/s of infection. 10/11/16: returns today for f/u. she reports adherence to her compression pumps, but there is no improvement regarding her BLE edema. there is weeping of fluid. denies fever, chills, body aches or malaise. 10/18/2016 -- she has a significant cellulitis of her right lower extremity which was not there last week. I believe at this stage she will need the expertise of Dr. Ola Spurr to decide on antibiotic coverage. 10/25/2016 -- the patient has had cipro and ampicillin been started by Dr. Ola Spurr and he is awaiting further cultures. Overall she's been feeling  better. 11/08/2016 -- Dr. Ola Spurr this week who has continued with ciprofloxacin and amoxicillin and is awaiting further cultures before deciding to place her on a PICC line. 11/15/2016 -- Dr. Blane Ohara note was reviewed and her cultures growing Pseudomonas in both legs which is sensitive to Cipro and the other is resistant to Cipro and she would get a PICC line and start IV antibiotics -- Ceftazidime 1 g q 12 for 3 weeks. 11-29-16 Mrs. Route presents today in follow-up of her bilateral lower exterminators ulcers. She remains on IV antibiotic therapy via a PICC line per Dr. Ola Spurr; she has a follow-up appointment with him on Monday, 12/02/2016. She anticipates that the about therapy will be discontinued at that appointment. She denies any complications of nausea vomiting and/or diarrhea accompanied by this antibiotic therapy. She admits to using her lymphedema pumps twice daily with the exception of yesterday. She denies any concerns or complications with the current treatment plan. Electronic Signature(s) Signed: 11/29/2016 10:05:04 AM By: Rene Kocher, NP, Jana Hakim By: Rene Kocher, NP, Gorman Safi on 11/29/2016 10:05:04 [name]  ([id number]) -------------------------------------------------------------------------------- Physical Exam Details Patient Name: REMMY, CRASS. Date of Service: 11/29/2016 8:45 AM Medical Record Number: [id number] Patient Account Number: [id number] Date of Birth/Sex: [birth year] (65 y.o. Female) Treating RN: Ahmed Prima Primary Care Physician: FITZGERALD, DAVID Other Clinician: Referring Physician: FITZGERALD, DAVID Treating Physician/Extender: Lawanda Cousins Weeks in Treatment: 23 Constitutional hypertensive. afebrile. morbidly obese. well nourished; well developed; appears stated age;Marland Kitchen Respiratory non-labored respiratory effort. Cardiovascular non-palpable DP and PT. 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 Gastrointestinal (GI) denies any nausea vomiting or diarrhea. Integumentary (Hair, Skin) RLE-posterior ulcer is covered with slough and fibrotic tissue with 2 granular buds, no periwound erythema, no malodor, smaller ulcerations scattered to the right lower extremity; LLE-posterior ulcer covered with slough and fibrotic tissue, no granulation tissue exposed, no periwound erythema, no malodor. Psychiatric appears to make sound judgement and have accurate insight regarding healthcare. oriented to time, place, person and situation. calm, pleasant, conversive. Electronic Signature(s) Signed: 11/29/2016 10:10:01 AM By: Rene Kocher, NP, Jana Hakim By: Rene Kocher, NP, Jahzaria Vary on 11/29/2016 10:10:01 EMILLIA, WEATHERLY ([id number]) -------------------------------------------------------------------------------- Physician Orders Details Patient Name: ANETT, RANKER. Date of Service: 11/29/2016 8:45 AM Medical Record Number: [id number] Patient Account Number: [id number] Date of Birth/Sex: [birth year] (65 y.o. Female) Treating RN: Ahmed Prima Primary Care Physician: FITZGERALD, DAVID Other Clinician: Referring Physician: FITZGERALD, DAVID Treating  Physician/Extender: Cathie Olden in Treatment: 66 Verbal / Phone Orders: Yes Clinician: Carolyne Fiscal, Debi Read Back and Verified: Yes Diagnosis Coding Wound Cleansing Wound #1 Left,Circumferential Lower Leg o Clean wound with Normal Saline. - clinic use o Cleanse wound with mild soap and water - HHRN please scrub wounds with mild soap and water o May Shower, gently pat wound dry prior to applying new dressing. o May shower with protection. Wound #2 Right,Circumferential Lower Leg o Clean wound with Normal Saline. - clinic use o Cleanse wound with mild soap and water - HHRN please scrub wounds with mild soap and water o May Shower, gently pat wound dry prior to applying new dressing. o May shower with protection. Anesthetic Wound #1 Left,Circumferential Lower Leg o Topical Lidocaine 4% cream applied to wound bed prior to debridement - clinic use Wound #2 Right,Circumferential Lower Leg o Topical Lidocaine 4% cream applied to wound bed prior to debridement - clinic use Skin Barriers/Peri-Wound Care Wound #1 Left,Circumferential Lower Leg o Other: - Lotrisone cream in folds at knees and on reddened areas on legs (Nystatin used in  clinic) Wound #2 Right,Circumferential Lower Leg o Other: - Lotrisone cream in folds at knees and on reddened areas on legs (Nystatin used in clinic) Primary Wound Dressing Wound #1 Left,Circumferential Lower Leg o Santyl Ointment - use on the posterior leg wounds o Aquacel Ag Wound #2 Right,Circumferential Lower Leg o Santyl Ointment - use on the posterior leg wounds o Aquacel Ag RILEA, ARUTYUNYAN ([id number]) Secondary Dressing Wound #1 Left,Circumferential Lower Leg o ABD pad o XtraSorb Wound #2 Right,Circumferential Lower Leg o ABD pad o XtraSorb Dressing Change Frequency Wound #1 Left,Circumferential Lower Leg o Change Dressing Monday, Wednesday, Friday - HHRN to change wraps Monday and  Wednesday [facility] Please see pt next Thursday or Friday [facility] will be closed for Thanksgiving 11/21/16-11/24-17 Wound #2 Right,Circumferential Lower Leg o Change Dressing Monday, Wednesday, Friday - HHRN to change wraps Monday and Wednesday [facility] Please see pt next Thursday or Friday [facility] will be closed for Thanksgiving 11/21/16-11/24-17 Follow-up Appointments Wound #1 Left,Circumferential Lower Leg o Return Appointment in 2 weeks. Wound #2 Right,Circumferential Lower Leg o Return Appointment in 2 weeks. Edema Control Wound #1 Left,Circumferential Lower Leg o 3 Layer Compression System - Bilateral - May anchor top of wrap with dome paste, but only wrap around leg one time. o Elevate legs to the level of the heart and pump ankles as often as possible Wound #2 Right,Circumferential Lower Leg o 3 Layer Compression System - Bilateral - May anchor top of wrap with dome paste, but only wrap around leg one time. o Elevate legs to the level of the heart and pump ankles as often as possible Additional Orders / Instructions o Increase protein intake. Home Health Wound #1 Left,Circumferential Lower Leg o Continue Home Health Visits - [facility]---HHRN Please see pt next Thursday or Friday [facility] will be closed for Thanksgiving 11/21/16-11/24-17 o Home Health Nurse may visit PRN to address patientos wound care needs. o FACE TO FACE ENCOUNTER: MEDICARE and MEDICAID PATIENTS: I certify that this patient is under my care and that I had a face-to-face encounter that meets the physician face-to-face AILEENE, LANUM ([id number]) encounter requirements with this patient on this date. The encounter with the patient was in whole or in part for the following MEDICAL CONDITION: (primary reason for [place]) MEDICAL NECESSITY: I certify, that based on my findings, NURSING services are a medically necessary home health service. HOME BOUND STATUS:  I certify that my clinical findings support that this patient is homebound (i.e., Due to illness or injury, pt requires aid of supportive devices such as crutches, cane, wheelchairs, walkers, the use of special transportation or the assistance of another person to leave their place of residence. There is a normal inability to leave the home and doing so requires considerable and taxing effort. Other absences are for medical reasons / religious services and are infrequent or of short duration when for other reasons). o Please direct any NON-WOUND related issues/requests for orders to patient's Primary Care Physician Wound #2 Right,Circumferential Lower Leg o [place] Visits - [facility]---HHRN Please see pt next Thursday or Friday [facility] will be closed for Thanksgiving 11/21/16-11/24-17 o Home Health Nurse may visit [facility]N to address patientos wound care needs. o FACE TO FACE ENCOUNTER: MEDICARE and MEDICAID PATIENTS: I certify that this patient is under my care and that I had a face-to-face encounter that meets the physician face-to-face encounter requirements with this patient on this date. The encounter with the patient was in  whole or in part for the following MEDICAL CONDITION: (primary reason for Home Healthcare) MEDICAL NECESSITY: I certify, that based on my findings, NURSING services are a medically necessary home health service. HOME BOUND STATUS: I certify that my clinical findings support that this patient is homebound (i.e., Due to illness or injury, pt requires aid of supportive devices such as crutches, cane, wheelchairs, walkers, the use of special transportation or the assistance of another person to leave their place of residence. There is a normal inability to leave the home and doing so requires considerable and taxing effort. Other absences are for medical reasons / religious services and are infrequent or of short duration when for other  reasons). o Please direct any NON-WOUND related issues/requests for orders to patient's Primary Care Physician Electronic Signature(s) Signed: 11/29/2016 4:53:54 PM By: Alric Quan Signed: 11/29/2016 8:21:50 PM By: Rene Kocher, NP, Juanisha Bautch Entered By: Alric Quan on 11/29/2016 09:30:11 MERIDETH, BOSQUE ([id number]) -------------------------------------------------------------------------------- Problem List Details Patient Name: SHAWANA, KNOCH. Date of Service: 11/29/2016 8:45 AM Medical Record Number: [id number] Patient Account Number: [id number] Date of Birth/Sex: 04-28-1951 (65 y.o. Female) Treating RN: Ahmed Prima Primary Care Physician: FITZGERALD, DAVID Other Clinician: Referring Physician: FITZGERALD, DAVID Treating Physician/Extender: Cathie Olden in Treatment: 23 Active Problems ICD-10 Encounter Code Description Active Date Diagnosis E11.622 Type 2 diabetes mellitus with other skin ulcer 10/25/2016 Yes I89.0 Lymphedema, not elsewhere classified 10/25/2016 Yes L97.222 Non-pressure chronic ulcer of left calf with fat layer 10/25/2016 Yes exposed L97.212 Non-pressure chronic ulcer of right calf with fat layer 10/25/2016 Yes exposed E66.01 Morbid (severe) obesity due to excess calories 10/25/2016 Yes Inactive Problems Resolved Problems Electronic Signature(s) Signed: 11/29/2016 10:02:07 AM By: Rene Kocher, NP, Lateka Rady Entered By: Rene Kocher, NP, Aurianna Earlywine on 11/29/2016 10:02:07 [name] ([id number]) -------------------------------------------------------------------------------- Progress Note Details Patient Name: SHADIAMOND, KOSKA. Date of Service: 11/29/2016 8:45 AM Medical Record Number: [id number] Patient Account Number: [id number] Date of Birth/Sex: 06/17/1951 (65 y.o. Female) Treating RN: Ahmed Prima Primary Care Physician: FITZGERALD, DAVID Other Clinician: Referring Physician: FITZGERALD, DAVID Treating Physician/Extender: Cathie Olden  in Treatment: 23 Subjective Chief Complaint Information obtained from Patient Mrs. Pompa returns for follow-up to her bilateral lower extremity ulcers History of Present Illness (HPI) The following HPI elements were documented for the patient's wound: Location: massive swelling of both lower extremities and ulceration both lower extremities Quality: Patient reports experiencing a dull pain to affected area(s). Severity: Patient states wound are getting worse. Duration: Patient has had the wound for >2 years prior to seeking treatment at the wound center Timing: Pain in wound is constant (hurts all the time) Context: The wound appeared gradually over time Modifying Factors: Other treatment(s) tried include:she has a lymphedema pump but uses it seldom and she's had several course of antibiotics Associated Signs and Symptoms: Patient reports having difficulty standing for long periods. 65 year old patient seen by Dr. Ola Spurr of infectious disease who has been following up for left lower extremity cellulitis and ulcer with recurrent bilateral lower extremity problems for several months. Recently she had a large right lower extremity bullae which opened out and has been ulcerated. She has seen the vascular group and has been getting Unna's wraps and has a lymphedema pump used in the past. Her prior cultures were positive for Pseudomonas, Proteus and was treated with amoxicillin. He has also been treated with 2 weeks course of ciprofloxacin and amoxicillin.. Increase of Lasix dose helped with the edema and echo showed no systolic CHF but may be diastolic problems. Past  medical history significant for diabetes mellitus type 2, venous stasis ulcer, obesity, diabetic peripheral neuropathy, status post back surgery, cholecystectomy, hysterectomy, arthroscopy of the knee. He is a former smoker and quit smoking in 1984. The patient has seen Dr. Delana Meyer who did not recommend any arterial or  venous duplex studies and has been using Unna's wraps and also recommended a lymphedema pump. she has been very noncompliant with using these. 06/28/2016 -- the patient is still on antibiotics as prescribed by Dr. Ola Spurr and he is asked her to take it for 3 weeks. The patient also says she has a lot of redness and pain in the folds of her thigh and lower extremity and this is very painful. 07/26/2016 -- the patient is off antibiotics and has been getting dressing changes 3 times a week. 08/09/2016 -- is been on Cipro and is taking potassium supplements along with her Lasix and is going to be seeing Dr. Ola Spurr for a consult return visit only in November 2017. AMESHA, BAILEY ([id number]) 08/23/2016 -- she was admitted to the hospital last week and I have reviewed these reports in detail. She was seen by Dr. Ola Spurr who noted a Pseudomonas infection which was resistant to ciprofloxacin and discharged her on Ceftazidime 1 g IV every 12 hourly anyone days. The antibiotic was to be stopped on September 11 and he would see her back in the clinic. 08/30/2016 -- had a communication from Dr. Ola Spurr that he would extend her antibiotics by a week if she continued to look like cellulitis was persisting. 09/13/2016 -- the PICC line is out and antibiotics have stopped. 10/04/16: returns today for f/u. reports utilizes compression pump twice daily. she is compliant with her compression therapy. she reports a new blister on the right proximal posterior calf. no systemic s/s of infection. 10/11/16: returns today for f/u. she reports adherence to her compression pumps, but there is no improvement regarding her BLE edema. there is weeping of fluid. denies fever, chills, body aches or malaise. 10/18/2016 -- she has a significant cellulitis of her right lower extremity which was not there last week. I believe at this stage she will need the expertise of Dr. Ola Spurr to decide on antibiotic  coverage. 10/25/2016 -- the patient has had cipro and ampicillin been started by Dr. Ola Spurr and he is awaiting further cultures. Overall she's been feeling better. 11/08/2016 -- Dr. Ola Spurr this week who has continued with ciprofloxacin and amoxicillin and is awaiting further cultures before deciding to place her on a PICC line. 11/15/2016 -- Dr. Blane Ohara note was reviewed and her cultures growing Pseudomonas in both legs which is sensitive to Cipro and the other is resistant to Cipro and she would get a PICC line and start IV antibiotics -- Ceftazidime 1 g q 12 for 3 weeks. 11-29-16 Mrs. Fletchall presents today in follow-up of her bilateral lower exterminators ulcers. She remains on IV antibiotic therapy via a PICC line per Dr. Ola Spurr; she has a follow-up appointment with him on Monday, 12/02/2016. She anticipates that the about therapy will be discontinued at that appointment. She denies any complications of nausea vomiting and/or diarrhea accompanied by this antibiotic therapy. She admits to using her lymphedema pumps twice daily with the exception of yesterday. She denies any concerns or complications with the current treatment plan. patient admits to overall significant improvement in discomfort and admits to decrease in drainage Objective Constitutional hypertensive. afebrile. morbidly obese. well nourished; well developed; appears stated age;Marland Kitchen Vitals Time Taken: 8:49 AM, Height:  67 in, Weight: 300 lbs, BMI: 47, Temperature: 97.8 F, Pulse: 75 bpm, Respiratory Rate: 18 breaths/min, Blood Pressure: 181/54 mmHg. LARIYAH, SHETTERLY. ([id number]) Respiratory non-labored respiratory effort. Cardiovascular non-palpable DP and PT. 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 Gastrointestinal (GI) denies any nausea vomiting or diarrhea. Psychiatric appears to make sound judgement and have accurate insight regarding healthcare. oriented to time, place, person  and situation. calm, pleasant, conversive. Integumentary (Hair, Skin) RLE-posterior ulcer is covered with slough and fibrotic tissue with 2 granular buds, no periwound erythema, no malodor, smaller ulcerations scattered to the right lower extremity; LLE-posterior ulcer covered with slough and fibrotic tissue, no granulation tissue exposed, no periwound erythema, no malodor. Wound #1 status is Open. Original cause of wound was Gradually Appeared. The wound is located on the Left,Circumferential Lower Leg. The wound measures 8.5cm length x 13cm width x 0.1cm depth; 86.786cm^2 area and 8.679cm^3 volume. The wound is limited to skin breakdown. There is no tunneling or undermining noted. There is a large amount of serous drainage noted. The wound margin is flat and intact. There is medium (34-66%) red granulation within the wound bed. There is a medium (34-66%) amount of necrotic tissue within the wound bed including Adherent Slough. The periwound skin appearance exhibited: Excoriation, Localized Edema, Maceration, Moist, Rubor. The periwound skin appearance did not exhibit: Callus, Crepitus, Fluctuance, Friable, Induration, Rash, Scarring, Dry/Scaly, Atrophie Blanche, Cyanosis, Ecchymosis, Hemosiderin Staining, Mottled, Pallor, Erythema. Periwound temperature was noted as No Abnormality. The periwound has tenderness on palpation. Wound #2 status is Open. Original cause of wound was Gradually Appeared. The wound is located on the Right,Circumferential Lower Leg. The wound measures 9.5cm length x 16cm width x 0.2cm depth; 119.381cm^2 area and 23.876cm^3 volume. The wound is limited to skin breakdown. There is no tunneling or undermining noted. There is a large amount of serous drainage noted. The wound margin is indistinct and nonvisible. There is medium (34-66%) red granulation within the wound bed. There is a medium (34- 66%) amount of necrotic tissue within the wound bed including Adherent Slough.  The periwound skin appearance exhibited: Excoriation, Localized Edema, Maceration, Moist, Hemosiderin Staining, Rubor, Erythema. The periwound skin appearance did not exhibit: Callus, Crepitus, Fluctuance, Friable, Induration, Rash, Scarring, Dry/Scaly, Atrophie Blanche, Cyanosis, Ecchymosis, Mottled, Pallor. The surrounding wound skin color is noted with erythema which is circumferential. Periwound temperature was noted as No Abnormality. The periwound has tenderness on palpation. 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 There was an Open Wound debridement with total area of 7.5 sq cm performed by Lawanda Cousins, NP. with the following instrument(s): Curette to remove Viable and Non-Viable tissue/material including Fibrin/Slough after achieving pain control using Lidocaine 4% Topical Solution. A time out was conducted at 09:25, prior to the start of the procedure. A Minimum amount of bleeding was controlled with Pressure. The procedure was tolerated well with a pain level of 0 throughout and a pain level of 0 following the procedure. Post Debridement Measurements: 5cm length x 1.5cm width x 0.2cm depth; 1.178cm^3 volume. Character of Wound/Ulcer Post Debridement requires further debridement. Severity of Tissue Post Debridement is: Fat layer exposed.  Post procedure Diagnosis Wound #1: Same as Pre-Procedure Wound #2 Wound #2 is a Lymphedema located on the Right,Circumferential Lower Leg . There was an Open Wound debridement with total area of 4 sq cm performed by Lawanda Cousins, NP. with the following instrument(s): Curette to remove Viable and Non-Viable tissue/material including Fibrin/Slough after achieving pain control using Lidocaine 4% Topical Solution. A time out was conducted at 09:25, prior to the start of the procedure. A Minimum amount of bleeding was controlled with Pressure. The procedure was tolerated well with a pain level of 0 throughout and a pain level of 0 following the procedure. Post Debridement Measurements: 2cm length x 2cm width x 0.2cm depth; 0.628cm^3 volume. Character of Wound/Ulcer Post Debridement requires further debridement. Severity of Tissue Post Debridement is: Fat layer exposed. Post procedure Diagnosis Wound #2: Same as Pre-Procedure Plan Wound Cleansing: Wound #1 Left,Circumferential Lower Leg: ANIYIA, RANE ([id number]) Clean wound with Normal Saline. - clinic use Cleanse wound with mild soap and water - HHRN please scrub wounds with mild soap and water May Shower, gently pat wound dry prior to applying new dressing. May shower with protection. Wound #2 Right,Circumferential Lower Leg: Clean wound with Normal Saline. - clinic use Cleanse wound with mild soap and water - HHRN please scrub wounds with mild soap and water May Shower, gently pat wound dry prior to applying new dressing. May shower with protection. Anesthetic: Wound #1 Left,Circumferential Lower Leg: Topical Lidocaine 4% cream applied to wound bed prior to debridement - clinic use Wound #2 Right,Circumferential Lower Leg: Topical Lidocaine 4% cream applied to wound bed prior to debridement - clinic use Skin Barriers/Peri-Wound Care: Wound #1 Left,Circumferential Lower Leg: Other: - Lotrisone cream in folds at knees and on reddened areas on legs (Nystatin used in clinic) Wound #2 Right,Circumferential Lower Leg: Other: - Lotrisone cream in folds at knees and on reddened areas on legs (Nystatin used in clinic) Primary Wound Dressing: Wound #1  Left,Circumferential Lower Leg: Santyl Ointment - use on the posterior leg wounds Aquacel Ag Wound #2 Right,Circumferential Lower Leg: Santyl Ointment - use on the posterior leg wounds Aquacel Ag Secondary Dressing: Wound #1 Left,Circumferential Lower Leg: ABD pad XtraSorb Wound #2 Right,Circumferential Lower Leg: ABD pad XtraSorb Dressing Change Frequency: Wound #1 Left,Circumferential Lower Leg: Change Dressing Monday, Wednesday, Friday - HHRN to change wraps Monday and Wednesday [facility] Please see pt next Thursday or Friday [facility] will be closed for Thanksgiving 11/21/16-11/24- 17 Wound #2 Right,Circumferential Lower Leg: Change Dressing Monday, Wednesday, Friday - HHRN to change wraps Monday and Wednesday [facility] Please see pt next Thursday or Friday [facility] will be closed for Thanksgiving 11/21/16-11/24- 17 Follow-up Appointments: Wound #1 Left,Circumferential Lower Leg: Return Appointment in 2 weeks. Wound #2 Right,Circumferential Lower Leg: Return Appointment in 2 weeks. Edema Control: Wound #1 Left,Circumferential Lower Leg: 3 Layer Compression System - Bilateral - May anchor top of wrap with dome paste, but only wrap around leg one time. TONIQUA, MELAMED. ([id number]) Elevate legs to the level of the heart and pump ankles as often as possible Wound #2 Right,Circumferential Lower Leg: 3 Layer Compression System - Bilateral - May anchor top of wrap with dome paste, but only wrap around leg one time. Elevate legs to the level of the heart and pump ankles as often as possible Additional Orders / Instructions: Increase protein intake. Home Health: Wound #1 Left,Circumferential Lower Leg: Continue Home Health Visits - [facility]---HHRN Please see pt next Thursday or Friday Wound Care  Clinic will be closed for Thanksgiving 11/21/16-11/24-17 Home Health Nurse may visit PRN to address patient s wound care needs. FACE TO FACE ENCOUNTER: MEDICARE and MEDICAID  PATIENTS: I certify that this patient is under my care and that I had a face-to-face encounter that meets the physician face-to-face encounter requirements with this patient on this date. The encounter with the patient was in whole or in part for the following MEDICAL CONDITION: (primary reason for [place]) MEDICAL NECESSITY: I certify, that based on my findings, NURSING services are a medically necessary home health service. HOME BOUND STATUS: I certify that my clinical findings support that this patient is homebound (i.e., Due to illness or injury, pt requires aid of supportive devices such as crutches, cane, wheelchairs, walkers, the use of special transportation or the assistance of another person to leave their place of residence. There is a normal inability to leave the home and doing so requires considerable and taxing effort. Other absences are for medical reasons / religious services and are infrequent or of short duration when for other reasons). Please direct any NON-WOUND related issues/requests for orders to patient's Primary Care Physician Wound #2 Right,Circumferential Lower Leg: [place] - [facility]---HHRN Please see pt next Thursday or Friday [facility] will be closed for Thanksgiving 11/21/16-11/24-17 Home Health Nurse may visit [facility] to address patient s wound care needs. FACE TO FACE ENCOUNTER: MEDICARE and MEDICAID PATIENTS: I certify that this patient is under my care and that I had a face-to-face encounter that meets the physician face-to-face encounter requirements with this patient on this date. The encounter with the patient was in whole or in part for the following MEDICAL CONDITION: (primary reason for [place]) MEDICAL NECESSITY: I certify, that based on my findings, NURSING services are a medically necessary home health service. HOME BOUND STATUS: I certify that my clinical findings support that this patient is homebound (i.e.,  Due to illness or injury, pt requires aid of supportive devices such as crutches, cane, wheelchairs, walkers, the use of special transportation or the assistance of another person to leave their place of residence. There is a normal inability to leave the home and doing so requires considerable and taxing effort. Other absences are for medical reasons / religious services and are infrequent or of short duration when for other reasons). Please direct any NON-WOUND related issues/requests for orders to patient's Primary Care Physician Follow-Up Appointments: A follow-up appointment should be scheduled. Medication Reconciliation completed and provided to Patient/Care Provider. A Patient Clinical Summary of Care was provided to [facility] ([id number]) 1. Continue with antibiotics therapy per Dr. Blane Ohara recommendation 2. Continue with follow-up scheduled with Dr. Ola Spurr next Monday 3. Continue with Santyl to bilateral posterior ulcers and alginate to remaining ulcerated areas 4. Continue with bilateral lower extreme compression therapy 5. Continue with lymphedema pumps twice daily, 60 minutes per session 6. Follow-up next week Electronic Signature(s) Signed: 11/29/2016 10:11:48 AM By: Rene Kocher, NP, Destaney Sarkis Entered By: Rene Kocher, NP, Margaretha Mahan on 11/29/2016 10:11:48 QUIARA, KILLIAN ([id number]) -------------------------------------------------------------------------------- SuperBill Details Patient Name: MARCIANA, UPLINGER. Date of Service: 11/29/2016 Medical Record Number: [id number] Patient Account Number: [id number] Date of Birth/Sex: 25-Apr-1951 (65 y.o. Female) Treating RN: Ahmed Prima Primary Care Physician: FITZGERALD, DAVID Other Clinician: Referring Physician: FITZGERALD, DAVID Treating Physician/Extender: Cathie Olden in Treatment: 23 Diagnosis Coding ICD-10 Codes Code Description E11.622 Type 2 diabetes mellitus with other skin ulcer I89.0 Lymphedema, not  elsewhere classified L97.222 Non-pressure chronic ulcer of left calf  with fat layer exposed L97.212 Non-pressure chronic ulcer of right calf with fat layer exposed E66.01 Morbid (severe) obesity due to excess calories Facility Procedures CPT4 Code: [id number] Description: [phone] - DEBRIDE WOUND 1ST 20 SQ CM OR < ICD-10 Description Diagnosis E11.622 Type 2 diabetes mellitus with other skin ulcer L97.222 Non-pressure chronic ulcer of left calf with fat la L97.212 Non-pressure chronic ulcer of right calf with fat  l I89.0 Lymphedema, not elsewhere classified Modifier: yer exposed ayer exposed Quantity: 1 Physician Procedures CPT4 Code: [id number] Description: [id number] - WC PHYS DEBR WO ANESTH 20 SQ CM ICD-10 Description Diagnosis E11.622 Type 2 diabetes mellitus with other skin ulcer L97.222 Non-pressure chronic ulcer of left calf with fat la L97.212 Non-pressure chronic ulcer of right calf with fat  l I89.0 Lymphedema, not elsewhere classified Modifier: yer exposed ayer exposed Quantity: 1 Electronic Signature(s) Signed: 11/29/2016 10:12:08 AM By: Rene Kocher, NP, Rayette Mogg Entered By: Rene Kocher, NP, Annel Zunker on 11/29/2016 10:12:08

## 2016-12-02 DIAGNOSIS — E1129 Type 2 diabetes mellitus with other diabetic kidney complication: Secondary | ICD-10-CM | POA: Diagnosis not present

## 2016-12-02 DIAGNOSIS — L02419 Cutaneous abscess of limb, unspecified: Secondary | ICD-10-CM | POA: Diagnosis not present

## 2016-12-02 DIAGNOSIS — Z792 Long term (current) use of antibiotics: Secondary | ICD-10-CM | POA: Diagnosis not present

## 2016-12-02 DIAGNOSIS — Z794 Long term (current) use of insulin: Secondary | ICD-10-CM | POA: Diagnosis not present

## 2016-12-02 DIAGNOSIS — R600 Localized edema: Secondary | ICD-10-CM | POA: Diagnosis not present

## 2016-12-02 DIAGNOSIS — R809 Proteinuria, unspecified: Secondary | ICD-10-CM | POA: Diagnosis not present

## 2016-12-02 DIAGNOSIS — L03119 Cellulitis of unspecified part of limb: Secondary | ICD-10-CM | POA: Diagnosis not present

## 2016-12-02 DIAGNOSIS — B965 Pseudomonas (aeruginosa) (mallei) (pseudomallei) as the cause of diseases classified elsewhere: Secondary | ICD-10-CM | POA: Diagnosis not present

## 2016-12-06 ENCOUNTER — Encounter: Payer: 59 | Admitting: Surgery

## 2016-12-06 DIAGNOSIS — I890 Lymphedema, not elsewhere classified: Secondary | ICD-10-CM | POA: Diagnosis not present

## 2016-12-06 DIAGNOSIS — E11622 Type 2 diabetes mellitus with other skin ulcer: Secondary | ICD-10-CM | POA: Diagnosis not present

## 2016-12-06 DIAGNOSIS — L97221 Non-pressure chronic ulcer of left calf limited to breakdown of skin: Secondary | ICD-10-CM | POA: Diagnosis not present

## 2016-12-06 DIAGNOSIS — L97211 Non-pressure chronic ulcer of right calf limited to breakdown of skin: Secondary | ICD-10-CM | POA: Diagnosis not present

## 2016-12-07 NOTE — Progress Notes (Addendum)
Brooke, Hopkins ([id number]) Visit Report for 12/06/2016 Chief Complaint Document Details Patient Name: Brooke Hopkins, CIELO. Date of Service: 12/06/2016 8:45 AM Medical Record Number: [id number] Patient Account Number: [id number] Date of Birth/Sex: 11-05-1951 (65 y.o. Female) Treating RN: Ahmed Prima Primary Care Physician: FITZGERALD, DAVID Other Clinician: Referring Physician: FITZGERALD, DAVID Treating Physician/Extender: Frann Rider in Treatment: 24 Information Obtained from: Patient Chief Complaint [name] returns for follow-up to her bilateral lower extremity ulcers Electronic Signature(s) Signed: 12/06/2016 9:54:02 AM By: Christin Fudge MD, FACS Entered By: Christin Fudge on 12/06/2016 09:54:01 [name] ([id number]) -------------------------------------------------------------------------------- HPI Details Patient Name: Brooke Hopkins, KAWAMOTO. Date of Service: 12/06/2016 8:45 AM Medical Record Number: [id number] Patient Account Number: [id number] Date of Birth/Sex: 12-17-1951 (65 y.o. Female) Treating RN: Ahmed Prima Primary Care Physician: FITZGERALD, DAVID Other Clinician: Referring Physician: FITZGERALD, DAVID Treating Physician/Extender: Frann Rider in Treatment: 24 History of Present Illness Location: massive swelling of both lower extremities and ulceration both lower extremities Quality: Patient reports experiencing a dull pain to affected area(s). Severity: Patient states wound are getting worse. Duration: Patient has had the wound for >2 years prior to seeking treatment at the wound center Timing: Pain in wound is constant (hurts all the time) Context: The wound appeared gradually over time Modifying Factors: Other treatment(s) tried include:she has a lymphedema pump but uses it seldom and she's had several course of antibiotics Associated Signs and Symptoms: Patient reports having difficulty standing for long periods. HPI  Description: 65 year old patient seen by Dr. Ola Spurr of infectious disease who has been following up for left lower extremity cellulitis and ulcer with recurrent bilateral lower extremity problems for several months. Recently she had a large right lower extremity bullae which opened out and has been ulcerated. She has seen the vascular group and has been getting Unna's wraps and has a lymphedema pump used in the past. Her prior cultures were positive for Pseudomonas, Proteus and was treated with amoxicillin. He has also been treated with 2 weeks course of ciprofloxacin and amoxicillin.. Increase of Lasix dose helped with the edema and echo showed no systolic CHF but may be diastolic problems. Past medical history significant for diabetes mellitus type 2, venous stasis ulcer, obesity, diabetic peripheral neuropathy, status post back surgery, cholecystectomy, hysterectomy, arthroscopy of the knee. He is a former smoker and quit smoking in 1984. The patient has seen Dr. Delana Meyer who did not recommend any arterial or venous duplex studies and has been using Unna's wraps and also recommended a lymphedema pump. she has been very noncompliant with using these. 06/28/2016 -- the patient is still on antibiotics as prescribed by Dr. Ola Spurr and he is asked her to take it for 3 weeks. The patient also says she has a lot of redness and pain in the folds of her thigh and lower extremity and this is very painful. 07/26/2016 -- the patient is off antibiotics and has been getting dressing changes 3 times a week. 08/09/2016 -- is been on Cipro and is taking potassium supplements along with her Lasix and is going to be seeing Dr. Ola Spurr for a consult return visit only in November 2017. 08/23/2016 -- she was admitted to the hospital last week and I have reviewed these reports in detail. She was seen by Dr. Ola Spurr who noted a Pseudomonas infection which was resistant to ciprofloxacin and discharged her  on Ceftazidime 1 g IV every 12 hourly anyone days. The antibiotic was to be stopped on September 11 and he would see her  back in the clinic. 08/30/2016 -- had a communication from Dr. Ola Spurr that he would extend her antibiotics by a week if she continued to look like cellulitis was persisting. GURNEET, MATARESE ([id number]) 09/13/2016 -- the PICC line is out and antibiotics have stopped. 10/04/16: returns today for f/u. reports utilizes compression pump twice daily. she is compliant with her compression therapy. she reports a new blister on the right proximal posterior calf. no systemic s/s of infection. 10/11/16: returns today for f/u. she reports adherence to her compression pumps, but there is no improvement regarding her BLE edema. there is weeping of fluid. denies fever, chills, body aches or malaise. 10/18/2016 -- she has a significant cellulitis of her right lower extremity which was not there last week. I believe at this stage she will need the expertise of Dr. Ola Spurr to decide on antibiotic coverage. 10/25/2016 -- the patient has had cipro and ampicillin been started by Dr. Ola Spurr and he is awaiting further cultures. Overall she's been feeling better. 11/08/2016 -- Dr. Ola Spurr this week who has continued with ciprofloxacin and amoxicillin and is awaiting further cultures before deciding to place her on a PICC line. 11/15/2016 -- Dr. Blane Ohara note was reviewed and her cultures growing Pseudomonas in both legs which is sensitive to Cipro and the other is resistant to Cipro and she would get a PICC line and start IV antibiotics -- Ceftazidime 1 g q 12 for 3 weeks. 11-29-16 Mrs. Brooke Hopkins presents today in follow-up of her bilateral lower exterminators ulcers. She remains on IV antibiotic therapy via a PICC line per Dr. Ola Spurr; she has a follow-up appointment with him on Monday, 12/02/2016. She anticipates that the about therapy will be discontinued at that  appointment. She denies any complications of nausea vomiting and/or diarrhea accompanied by this antibiotic therapy. She admits to using her lymphedema pumps twice daily with the exception of yesterday. She denies any concerns or complications with the current treatment plan. 12/06/2016 -- Dr. Ola Spurr saw her recently on 12/02/2016 and due to the fact she has had significant problems with recurrent ulceration and Pseudomonas infection he recommended 3 more weeks of antibiotics and extended it until December 25. The PICC line will be in place. Last hemoglobin A1c on November 30 was 9% Electronic Signature(s) Signed: 12/06/2016 9:54:10 AM By: Christin Fudge MD, FACS Previous Signature: 12/06/2016 9:01:29 AM Version By: Christin Fudge MD, FACS Previous Signature: 12/06/2016 8:59:41 AM Version By: Christin Fudge MD, FACS Entered By: Christin Fudge on 12/06/2016 09:54:09 [name] ([id number]) -------------------------------------------------------------------------------- Physical Exam Details Patient Name: Brooke Hopkins, DAYRIT. Date of Service: 12/06/2016 8:45 AM Medical Record Number: [id number] Patient Account Number: [id number] Date of Birth/Sex: 1951-11-25 (65 y.o. Female) Treating RN: Ahmed Prima Primary Care Physician: FITZGERALD, DAVID Other Clinician: Referring Physician: FITZGERALD, DAVID Treating Physician/Extender: Frann Rider in Treatment: 24 Constitutional . Pulse regular. Respirations normal and unlabored. Afebrile. . Eyes Nonicteric. Reactive to light. Ears, Nose, Mouth, and Throat Lips, teeth, and gums WNL.Marland Kitchen Moist mucosa without lesions. Neck supple and nontender. No palpable supraclavicular or cervical adenopathy. Normal sized without goiter. Respiratory WNL. No retractions.. Cardiovascular Pedal Pulses WNL. No clubbing, cyanosis or edema. Lymphatic No adneopathy. No adenopathy. No adenopathy. Musculoskeletal Adexa without tenderness or enlargement..  Digits and nails w/o clubbing, cyanosis, infection, petechiae, ischemia, or inflammatory conditions.. Integumentary (Hair, Skin) No suspicious lesions. No crepitus or fluctuance. No peri-wound warmth or erythema. No masses.Marland Kitchen Psychiatric Judgement and insight Intact.. No evidence of depression, anxiety, or agitation.. Notes the patient has  significant amount of lymphedema today because she has not been wearing the right compression wraps. The posterior part of her legs continue to have subcutaneous debris and have washed this out with moist saline gauze and removed all the debris. She still has some fibrotic debris at the base of which will need santyl ointment. Electronic Signature(s) Signed: 12/06/2016 9:55:24 AM By: Christin Fudge MD, FACS Entered By: Christin Fudge on 12/06/2016 09:55:24 [name] ([id number]) -------------------------------------------------------------------------------- Physician Orders Details Patient Name: ARIES, KASA. Date of Service: 12/06/2016 8:45 AM Medical Record Number: [id number] Patient Account Number: [id number] Date of Birth/Sex: August 27, 1951 (65 y.o. Female) Treating RN: Ahmed Prima Primary Care Physician: FITZGERALD, DAVID Other Clinician: Referring Physician: FITZGERALD, DAVID Treating Physician/Extender: Frann Rider in Treatment: 48 Verbal / Phone Orders: Yes Clinician: Carolyne Fiscal, Debi Read Back and Verified: Yes Diagnosis Coding Wound Cleansing Wound #1 Left,Circumferential Lower Leg o Clean wound with Normal Saline. - clinic use o Cleanse wound with mild soap and water - HHRN please scrub wounds with mild soap and water o May Shower, gently pat wound dry prior to applying new dressing. o May shower with protection. Wound #2 Right,Circumferential Lower Leg o Clean wound with Normal Saline. - clinic use o Cleanse wound with mild soap and water - HHRN please scrub wounds with mild soap and water o May  Shower, gently pat wound dry prior to applying new dressing. o May shower with protection. Anesthetic Wound #1 Left,Circumferential Lower Leg o Topical Lidocaine 4% cream applied to wound bed prior to debridement - clinic use Wound #2 Right,Circumferential Lower Leg o Topical Lidocaine 4% cream applied to wound bed prior to debridement - clinic use Skin Barriers/Peri-Wound Care Wound #1 Left,Circumferential Lower Leg o Other: - Lotrisone cream in folds at knees and on reddened areas on legs (Nystatin used in clinic) Wound #2 Right,Circumferential Lower Leg o Other: - Lotrisone cream in folds at knees and on reddened areas on legs (Nystatin used in clinic) Primary Wound Dressing Wound #1 Left,Circumferential Lower Leg o Santyl Ointment - use on the posterior leg wounds o Aquacel Ag Wound #2 Right,Circumferential Lower Leg o Santyl Ointment - use on the posterior leg wounds o Aquacel Ag ELLINOR, TEST ([id number]) Secondary Dressing Wound #1 Left,Circumferential Lower Leg o ABD pad Wound #2 Right,Circumferential Lower Leg o ABD pad Dressing Change Frequency Wound #1 Left,Circumferential Lower Leg o Change Dressing Monday, Wednesday, Friday - HHRN to change wraps Monday and Wednesday Wound #2 Right,Circumferential Lower Leg o Change Dressing Monday, Wednesday, Friday - HHRN to change wraps Monday and Wednesday o Change Dressing Monday, Wednesday, Friday - HHRN to change wraps Monday and Wednesday Follow-up Appointments Wound #1 Left,Circumferential Lower Leg o Return Appointment in 2 weeks. Wound #2 Right,Circumferential Lower Leg o Return Appointment in 2 weeks. Edema Control Wound #1 Left,Circumferential Lower Leg o 3 Layer Compression System - Bilateral - May anchor top of wrap with dome paste, but only wrap around leg one time. o Elevate legs to the level of the heart and pump ankles as often as possible Wound #2 Right,Circumferential  Lower Leg o 3 Layer Compression System - Bilateral - May anchor top of wrap with dome paste, but only wrap around leg one time. o Elevate legs to the level of the heart and pump ankles as often as possible Additional Orders / Instructions o Increase protein intake. Home Health Wound #1 Left,Circumferential Lower Leg o Continue Home Health Visits - [facility]---HHRN Please see pt next Thursday or Friday Wound Care  Clinic will be closed for Thanksgiving 11/21/16-11/24-17 [facility] Nurse may visit PRN to address patientos wound care needs. o FACE TO FACE ENCOUNTER: MEDICARE and MEDICAID PATIENTS: I certify that this patient is under my care and that I had a face-to-face encounter that meets the physician face-to-face encounter requirements with this patient on this date. The encounter with the patient was in whole or in part for the following MEDICAL CONDITION: (primary reason for [place]) MEDICAL NECESSITY: I certify, that based on my findings, NURSING services are a medically necessary home health service. HOME BOUND STATUS: I certify that my clinical findings support that this patient is homebound (i.e., Due to illness or injury, pt requires aid of TAISA, DELORIA. ([id number]) supportive devices such as crutches, cane, wheelchairs, walkers, the use of special transportation or the assistance of another person to leave their place of residence. There is a normal inability to leave the home and doing so requires considerable and taxing effort. Other absences are for medical reasons / religious services and are infrequent or of short duration when for other reasons). o Please direct any NON-WOUND related issues/requests for orders to patient's Primary Care Physician Wound #2 Right,Circumferential Lower Leg o [place] Visits - [facility]---HHRN Please see pt next Thursday or Friday [facility] will be closed for Thanksgiving 11/21/16-11/24-17 o  Home Health Nurse may visit PRN to address patientos wound care needs. o FACE TO FACE ENCOUNTER: MEDICARE and MEDICAID PATIENTS: I certify that this patient is under my care and that I had a face-to-face encounter that meets the physician face-to-face encounter requirements with this patient on this date. The encounter with the patient was in whole or in part for the following MEDICAL CONDITION: (primary reason for [place]) MEDICAL NECESSITY: I certify, that based on my findings, NURSING services are a medically necessary home health service. HOME BOUND STATUS: I certify that my clinical findings support that this patient is homebound (i.e., Due to illness or injury, pt requires aid of supportive devices such as crutches, cane, wheelchairs, walkers, the use of special transportation or the assistance of another person to leave their place of residence. There is a normal inability to leave the home and doing so requires considerable and taxing effort. Other absences are for medical reasons / religious services and are infrequent or of short duration when for other reasons). o Please direct any NON-WOUND related issues/requests for orders to patient's Primary Care Physician Electronic Signature(s) Signed: 12/06/2016 3:38:57 PM By: Montey Hora Signed: 12/06/2016 3:45:07 PM By: Christin Fudge MD, FACS Previous Signature: 12/06/2016 12:21:12 PM Version By: Alric Quan Entered By: Montey Hora on 12/06/2016 15:23:29 [name] ([id number]) -------------------------------------------------------------------------------- Problem List Details Patient Name: ZULEICA, SEITH. Date of Service: 12/06/2016 8:45 AM Medical Record Number: [id number] Patient Account Number: [id number] Date of Birth/Sex: [birth year] (65 y.o. 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 Date of Service: 12/06/2016 8:45 AM Medical Record Number: [id number] Patient Account Number: [id number] Date of Birth/Sex: April 22, 1951 (65 y.o. Female) Treating RN: Ahmed Prima Primary Care Physician: FITZGERALD, DAVID Other Clinician: Referring Physician: FITZGERALD, DAVID Treating Physician/Extender: Frann Rider in Treatment: 24 Subjective Chief Complaint Information obtained from Patient Mrs. Nealis returns for follow-up to her bilateral lower extremity ulcers History of Present Illness (HPI) The following HPI elements were documented for the patient's wound: Location: massive swelling of both lower extremities and ulceration both lower extremities Quality: Patient reports experiencing a dull pain to affected area(s). Severity: Patient states wound are getting worse. Duration: Patient has had the wound for >2 years prior to seeking treatment at the wound center Timing: Pain in wound is constant (hurts all the time) Context: The wound appeared gradually over time Modifying Factors: Other treatment(s) tried include:she has a lymphedema pump  but uses it seldom and she's had several course of antibiotics Associated Signs and Symptoms: Patient reports having difficulty standing for long periods. 65 year old patient seen by Dr. Ola Spurr of infectious disease who has been following up for left lower extremity cellulitis and ulcer with recurrent bilateral lower extremity problems for several months. Recently she had a large right lower extremity bullae which opened out and has been ulcerated. She has seen the vascular group and has been getting Unna's wraps and has a lymphedema pump used in the past. Her prior cultures were positive for Pseudomonas, Proteus and was treated with amoxicillin. He has also been treated with 2 weeks course of ciprofloxacin and amoxicillin.. Increase of Lasix dose helped with the edema and echo showed no systolic CHF but may be diastolic problems. Past medical history significant for diabetes mellitus type 2, venous stasis ulcer, obesity, diabetic peripheral neuropathy, status post back surgery, cholecystectomy, hysterectomy, arthroscopy of the knee. He is a former smoker and quit smoking in 1984. The patient has seen Dr. Delana Meyer who did not recommend any arterial or venous duplex studies and has been using Unna's wraps and also recommended a lymphedema pump. she has been very noncompliant with using these. 06/28/2016 -- the patient is still on antibiotics as prescribed by Dr. Ola Spurr and he is asked her to take it for 3 weeks. The patient also says she has a lot of redness and pain in the folds of her thigh and lower extremity and this is very painful. 07/26/2016 -- the patient is off antibiotics and has been getting dressing changes 3 times a week. 08/09/2016 -- is been on Cipro and is taking potassium supplements along with her Lasix and is going to be seeing Dr. Ola Spurr for a consult return visit only in November 2017. TAMITHA, NORELL ([id number]) 08/23/2016 -- she was admitted to the hospital  last week and I have reviewed these reports in detail. She was seen by Dr. Ola Spurr who noted a Pseudomonas infection which was resistant to ciprofloxacin and discharged her on Ceftazidime 1 g IV every 12 hourly anyone days. The antibiotic was to be stopped on September 11 and he would see her back in the clinic. 08/30/2016 -- had a communication from Dr. Ola Spurr that he would extend her antibiotics by a week if she continued to look like cellulitis was persisting. 09/13/2016 -- the PICC line is out and antibiotics have stopped. 10/04/16: returns today for f/u. reports utilizes compression pump twice daily. she is compliant with her compression therapy. she reports a new blister on  the right proximal posterior calf. no systemic s/s of infection. 10/11/16: returns today for f/u. she reports adherence to her compression pumps, but there is no improvement regarding her BLE edema. there is weeping of fluid. denies fever, chills, body aches or malaise. 10/18/2016 -- she has a significant cellulitis of her right lower extremity which was not there last week. I believe at this stage she will need the expertise of Dr. Ola Spurr to decide on antibiotic coverage. 10/25/2016 -- the patient has had cipro and ampicillin been started by Dr. Ola Spurr and he is awaiting further cultures. Overall she's been feeling better. 11/08/2016 -- Dr. Ola Spurr this week who has continued with ciprofloxacin and amoxicillin and is awaiting further cultures before deciding to place her on a PICC line. 11/15/2016 -- Dr. Blane Ohara note was reviewed and her cultures growing Pseudomonas in both legs which is sensitive to Cipro and the other is resistant to Cipro and she would get a PICC line and start IV antibiotics -- Ceftazidime 1 g q 12 for 3 weeks. 11-29-16 Mrs. Cunanan presents today in follow-up of her bilateral lower exterminators ulcers. She remains on IV antibiotic therapy via a PICC line per Dr.  Ola Spurr; she has a follow-up appointment with him on Monday, 12/02/2016. She anticipates that the about therapy will be discontinued at that appointment. She denies any complications of nausea vomiting and/or diarrhea accompanied by this antibiotic therapy. She admits to using her lymphedema pumps twice daily with the exception of yesterday. She denies any concerns or complications with the current treatment plan. 12/06/2016 -- Dr. Ola Spurr saw her recently on 12/02/2016 and due to the fact she has had significant problems with recurrent ulceration and Pseudomonas infection he recommended 3 more weeks of antibiotics and extended it until December 25. The PICC line will be in place. Last hemoglobin A1c on November 30 was 9% Objective Constitutional Pulse regular. Respirations normal and unlabored. Afebrile. Vitals Time Taken: 8:59 AM, Height: 67 in, Weight: 300 lbs, BMI: 47, Temperature: 98.1 F, Pulse: 72 bpm, Respiratory Rate: 18 breaths/min, Blood Pressure: 180/90 mmHg. JOVITA, PERSING. ([id number]) General Notes: Pt states that she has not had her BP medication this morning. She states that she will take it when she gets home and can eat something. BP taken manually. Eyes Nonicteric. Reactive to light. Ears, Nose, Mouth, and Throat Lips, teeth, and gums WNL.Marland Kitchen Moist mucosa without lesions. Neck supple and nontender. No palpable supraclavicular or cervical adenopathy. Normal sized without goiter. Respiratory WNL. No retractions.. Cardiovascular Pedal Pulses WNL. No clubbing, cyanosis or edema. Lymphatic No adneopathy. No adenopathy. No adenopathy. Musculoskeletal Adexa without tenderness or enlargement.. Digits and nails w/o clubbing, cyanosis, infection, petechiae, ischemia, or inflammatory conditions.Marland Kitchen Psychiatric Judgement and insight Intact.. No evidence of depression, anxiety, or agitation.. General Notes: the patient has significant amount of lymphedema today because  she has not been wearing the right compression wraps. The posterior part of her legs continue to have subcutaneous debris and have washed this out with moist saline gauze and removed all the debris. She still has some fibrotic debris at the base of which will need santyl ointment. Integumentary (Hair, Skin) No suspicious lesions. No crepitus or fluctuance. No peri-wound warmth or erythema. No masses.. Wound #1 status is Open. Original cause of wound was Gradually Appeared. The wound is located on the Left,Circumferential Lower Leg. The wound measures 8cm length x 13cm width x 0.2cm depth; 81.681cm^2 area and 16.336cm^3 volume. The wound is limited to skin breakdown. There is no tunneling  or undermining noted. There is a large amount of serous drainage noted. The wound margin is flat and intact. There is medium (34-66%) red granulation within the wound bed. There is a medium (34-66%) amount of necrotic tissue within the wound bed including Adherent Slough. The periwound skin appearance exhibited: Excoriation, Localized Edema, Maceration, Moist, Rubor. The periwound skin appearance did not exhibit: Callus, Crepitus, Fluctuance, Friable, Induration, Rash, Scarring, Dry/Scaly, Atrophie Blanche, Cyanosis, Ecchymosis, Hemosiderin Staining, Mottled, Pallor, Erythema. Periwound temperature was noted as No Abnormality. The periwound has tenderness on palpation. Wound #2 status is Open. Original cause of wound was Gradually Appeared. The wound is located on the Right,Circumferential Lower Leg. The wound measures 10.5cm length x 1.5cm width x 0.2cm depth; EVALEIGH, MCCAMY. ([id number]) 12.37cm^2 area and 2.474cm^3 volume. The wound is limited to skin breakdown. There is no tunneling or undermining noted. There is a large amount of serous drainage noted. The wound margin is indistinct and nonvisible. There is medium (34-66%) red granulation within the wound bed. There is a medium (34-66%) amount of  necrotic tissue within the wound bed including Adherent Slough. The periwound skin appearance exhibited: Excoriation, Localized Edema, Maceration, Moist, Hemosiderin Staining, Rubor, Erythema. The periwound skin appearance did not exhibit: Callus, Crepitus, Fluctuance, Friable, Induration, Rash, Scarring, Dry/Scaly, Atrophie Blanche, Cyanosis, Ecchymosis, Mottled, Pallor. The surrounding wound skin color is noted with erythema which is circumferential. Periwound temperature was noted as No Abnormality. The periwound has tenderness on palpation. 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 - clinic use Cleanse wound with mild soap and water - HHRN please scrub wounds with mild soap and water May Shower, gently pat wound dry prior to applying new dressing. May shower with protection. Wound #2 Right,Circumferential Lower Leg: Clean wound with Normal Saline. - clinic use Cleanse wound with mild soap and water - HHRN please scrub wounds with mild soap and water May Shower, gently pat wound dry prior to applying new dressing. May shower with protection. Anesthetic: Wound #1 Left,Circumferential Lower Leg: Topical Lidocaine 4% cream applied to wound bed prior to debridement - clinic use Wound #2 Right,Circumferential Lower Leg: Topical Lidocaine 4% cream applied to wound bed prior to debridement - clinic use Skin Barriers/Peri-Wound Care: Wound #1 Left,Circumferential Lower Leg: Other: - Lotrisone cream in folds at knees and on reddened areas on legs (Nystatin used in clinic) MARLA, POULIOT.  ([id number]) Wound #2 Right,Circumferential Lower Leg: Other: - Lotrisone cream in folds at knees and on reddened areas on legs (Nystatin used in clinic) Primary Wound Dressing: Wound #1 Left,Circumferential Lower Leg: Santyl Ointment - use on the posterior leg wounds Aquacel Ag Wound #2 Right,Circumferential Lower Leg: Santyl Ointment - use on the posterior leg wounds Aquacel Ag Secondary Dressing: Wound #1 Left,Circumferential Lower Leg: ABD pad Wound #2 Right,Circumferential Lower Leg: ABD pad Dressing Change Frequency: Wound #1 Left,Circumferential Lower Leg: Change Dressing Monday, Wednesday, Friday - HHRN to change wraps Monday and Wednesday Wound #2 Right,Circumferential Lower Leg: Change Dressing Monday, Wednesday, Friday - HHRN to change wraps Monday and Wednesday Change Dressing Monday, Wednesday, Friday - HHRN to change wraps Monday and Wednesday Follow-up Appointments: Wound #1 Left,Circumferential Lower Leg:  Return Appointment in 2 weeks. Wound #2 Right,Circumferential Lower Leg: Return Appointment in 2 weeks. Edema Control: Wound #1 Left,Circumferential Lower Leg: 3 Layer Compression System - Bilateral - May anchor top of wrap with dome paste, but only wrap around leg one time. Elevate legs to the level of the heart and pump ankles as often as possible Wound #2 Right,Circumferential Lower Leg: 3 Layer Compression System - Bilateral - May anchor top of wrap with dome paste, but only wrap around leg one time. Elevate legs to the level of the heart and pump ankles as often as possible Additional Orders / Instructions: Increase protein intake. Home Health: Wound #1 Left,Circumferential Lower Leg: Continue Home Health Visits - [facility]---HHRN Please see pt next Thursday or Friday [facility] will be closed for Thanksgiving 11/21/16-11/24-17 Home Health Nurse may visit [facility] to address patient s wound care needs. FACE TO FACE ENCOUNTER: MEDICARE and MEDICAID  PATIENTS: I certify that this patient is under my care and that I had a face-to-face encounter that meets the physician face-to-face encounter requirements with this patient on this date. The encounter with the patient was in whole or in part for the following MEDICAL CONDITION: (primary reason for [place]) MEDICAL NECESSITY: I certify, that based on my findings, NURSING services are a medically necessary home health service. HOME BOUND STATUS: I certify that my clinical findings support that this patient is homebound (i.e., Due to illness or injury, pt requires aid of supportive devices such as crutches, cane, wheelchairs, walkers, the use of special transportation or the assistance of another person to leave their place of residence. There is a normal inability to leave the home and doing so requires considerable and taxing effort. Other absences are for medical reasons / religious services and are infrequent or of short duration when for other reasons). MARILYNE, HASELEY ([id number]) Please direct any NON-WOUND related issues/requests for orders to patient's Primary Care Physician Wound #2 Right,Circumferential Lower Leg: [place] Visits - [facility]---HHRN Please see pt next Thursday or Friday [facility] will be closed for Thanksgiving 11/21/16-11/24-17 Home Health Nurse may visit [facility] to address patient s wound care needs. FACE TO FACE ENCOUNTER: MEDICARE and MEDICAID PATIENTS: I certify that this patient is under my care and that I had a face-to-face encounter that meets the physician face-to-face encounter requirements with this patient on this date. The encounter with the patient was in whole or in part for the following MEDICAL CONDITION: (primary reason for [place]) MEDICAL NECESSITY: I certify, that based on my findings, NURSING services are a medically necessary home health service. HOME BOUND STATUS: I certify that my clinical findings support that  this patient is homebound (i.e., Due to illness or injury, pt requires aid of supportive devices such as crutches, cane, wheelchairs, walkers, the use of special transportation or the assistance of another person to leave their place of residence. There is a normal inability to leave the home and doing so requires considerable and taxing effort. Other absences are for medical reasons / religious services and are infrequent or of short duration when for other reasons). Please direct any NON-WOUND related issues/requests for orders to patient's Primary Care Physician I have recommended: 1. silver alginate and drawtex and a 3 layer Profore. 2. She will continue with elevation and exercise and her lymphedema pumps and 3. Santyl to be applied at the posterior part of her wounds 4. antibiotics as per Dr. Ola Spurr. 5. changing her dressings twice a week. Electronic  Signature(s) Signed: 12/06/2016 3:47:59 PM By: Christin Fudge MD, FACS Previous Signature: 12/06/2016 9:57:10 AM Version By: Christin Fudge MD, FACS Previous Signature: 12/06/2016 9:56:34 AM Version By: Christin Fudge MD, FACS Entered By: Christin Fudge on 12/06/2016 15:47:59 [name] ([id number]) -------------------------------------------------------------------------------- SuperBill Details Patient Name: Brooke Hopkins, JILLSON. Date of Service: 12/06/2016 Medical Record Number: [id number] Patient Account Number: [id number] Date of Birth/Sex: 1951-01-03 (65 y.o. 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 Physician Procedures CPT4 Code: [id number] Description: 17494 - WC PHYS LEVEL 3 - EST PT ICD-10 Description Diagnosis E11.622 Type 2 diabetes mellitus with other skin ulcer I89.0 Lymphedema, not elsewhere classified L97.222 Non-pressure chronic ulcer of left calf with fat 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 Modifier: layer exposed layer exposed Quantity: 1 Electronic Signature(s) Signed: 12/06/2016 12:21:12 PM By: Alric Quan Signed: 12/06/2016 3:45:07 PM By: Christin Fudge MD, FACS Previous Signature: 12/06/2016 9:57:25 AM Version By: Christin Fudge MD, FACS Entered By: Alric Quan on 12/06/2016 10:05:58

## 2016-12-07 NOTE — Progress Notes (Signed)
Brooke Hopkins ([id number]) Visit Report for 12/06/2016 Arrival Information Details Patient Name: Brooke Hopkins, Brooke Hopkins. Date of Service: 12/06/2016 8:45 AM Medical Record Number: [id number] Patient Account Number: [id number] Date of Birth/Sex: 08/26/1951 (65 y.o. Female) Treating RN: Ahmed Prima Primary Care Physician: FITZGERALD, DAVID Other Clinician: Referring Physician: FITZGERALD, DAVID Treating Physician/Extender: Frann Rider in Treatment: 24 Visit Information History Since Last Visit All ordered tests and consults were completed: No Patient Arrived: Wheel Chair Added or deleted any medications: No Arrival Time: 08:51 Any new allergies or adverse reactions: No Accompanied By: self Had a fall or experienced change in No Transfer Assistance: [employer] activities of daily living that may affect Patient Lift risk of falls: Patient Identification Verified: Yes Signs or symptoms of abuse/neglect since last No Secondary Verification Process Yes visito Completed: Hospitalized since last visit: No Patient Requires Transmission- No Pain Present Now: No Based Precautions: Patient Has Alerts: No Electronic Signature(s) Signed: 12/06/2016 12:21:12 PM By: Alric Quan Entered By: Alric Quan on 12/06/2016 08:52:01 [name] ([id number]) -------------------------------------------------------------------------------- Encounter Discharge Information Details Patient Name: Brooke, Hopkins. Date of Service: 12/06/2016 8:45 AM Medical Record Number: [id number] Patient Account Number: [id number] Date of Birth/Sex: 17-Feb-1951 (65 y.o. Female) Treating RN: Ahmed Prima Primary Care Physician: FITZGERALD, DAVID Other Clinician: Referring Physician: FITZGERALD, DAVID Treating Physician/Extender: Frann Rider in Treatment: 24 Encounter Discharge Information Items Discharge Pain Level: 0 Discharge Condition: Stable Ambulatory Status:  Wheelchair Discharge Destination: Home Transportation: Private Auto Accompanied By: son Schedule Follow-up Appointment: Yes Medication Reconciliation completed and provided to Patient/Care Yes Carliyah Cotterman: Provided on Clinical Summary of Care: 12/06/2016 Form Type Recipient Paper Patient LB Electronic Signature(s) Signed: 12/06/2016 12:21:12 PM By: Alric Quan Previous Signature: 12/06/2016 9:41:20 AM Version By: Ruthine Dose Entered By: Alric Quan on 12/06/2016 10:06:35 [name] ([id number]) -------------------------------------------------------------------------------- Lower Extremity Assessment Details Patient Name: Brooke, Hopkins. Date of Service: 12/06/2016 8:45 AM Medical Record Number: [id number] Patient Account Number: [id number] Date of Birth/Sex: 04/23/1951 (65 y.o. Female) Treating RN: Ahmed Prima Primary Care Physician: FITZGERALD, DAVID Other Clinician: Referring Physician: FITZGERALD, DAVID Treating Physician/Extender: Frann Rider in Treatment: 24 Edema Assessment Assessed: [Left: No] [Right: No] E[Left: dema] [Right: :] Calf Left: Right: Point of Measurement: 32 cm From Medial Instep 55 cm 50.6 cm Ankle Left: Right: Point of Measurement: 11 cm From Medial Instep 33.6 cm 31.8 cm Vascular Assessment Pulses: Posterior Tibial Dorsalis Pedis Palpable: [Left:Yes] [Right:Yes] Extremity colors, hair growth, and conditions: Extremity Color: [Left:Red] [Right:Red] Temperature of Extremity: [Left:Warm] [Right:Warm] Capillary Refill: [Left:< 3 seconds] [Right:< 3 seconds] Toe Nail Assessment Left: Right: Thick: Yes Yes Discolored: Yes Yes Deformed: Yes Yes Improper Length and Hygiene: Yes Yes Electronic Signature(s) Signed: 12/06/2016 12:21:12 PM By: Alric Quan Entered By: Alric Quan on 12/06/2016 08:58:52 Beer, [name]  ([id number]) -------------------------------------------------------------------------------- Multi Wound Chart Details Patient Name: Brooke Hopkins. Date of Service: 12/06/2016 8:45 AM Medical Record Number: [id number] Patient Account Number: [id number] Date of Birth/Sex: 1951-07-12 (65 y.o. Female) Treating RN: Ahmed Prima Primary Care Physician: FITZGERALD, DAVID Other Clinician: Referring Physician: FITZGERALD, DAVID Treating Physician/Extender: Frann Rider in Treatment: 24 Vital Signs Height(in): 67 Pulse(bpm): Weight(lbs): 300 Blood Pressure (mmHg): Body Mass Index(BMI): 47 Temperature(F): 98.1 Respiratory Rate 18 (breaths/min): Photos: [1:No Photos] [2:No Photos] [N/A:N/A] Wound Location: [1:Left Lower Leg - Circumfernential] [2:Right Lower Leg - Circumfernential] [N/A:N/A] Wounding Event: [1:Gradually Appeared] [2:Gradually Appeared] [N/A:N/A] Primary Etiology: [1:Lymphedema] [2:Lymphedema] [N/A:N/A] Date Acquired: [1:12/31/2015] [2:12/31/2015] [N/A:N/A] Weeks of Treatment: [1:24] [2:24] [N/A:N/A] Wound Status: [1:Open] [2:Open] [N/A:N/A]  Clustered Wound: [1:No] [2:Yes] [N/A:N/A] Clustered Quantity: [1:N/A] [2:3] [N/A:N/A] Measurements L x W x D 8x13x0.2 [2:10.5x1.5x0.2] [N/A:N/A] (cm) Area (cm) : [1:81.681] [2:12.37] [N/A:N/A] Volume (cm) : [1:16.336] [2:2.474] [N/A:N/A] % Reduction in Area: [1:18.80%] [2:94.50%] [N/A:N/A] % Reduction in Volume: 18.80% [2:94.50%] [N/A:N/A] Classification: [1:Partial Thickness] [2:Partial Thickness] [N/A:N/A] Exudate Amount: [1:Large] [2:Large] [N/A:N/A] Exudate Type: [1:Serous] [2:Serous] [N/A:N/A] Exudate Color: [1:amber] [2:amber] [N/A:N/A] Foul Odor After [1:Yes] [2:Yes] [N/A:N/A] Cleansing: Odor Anticipated Due to No [2:No] [N/A:N/A] Product Use: Wound Margin: [1:Flat and Intact] [2:Indistinct, nonvisible] [N/A:N/A] Granulation Amount: [1:Medium (34-66%)] [2:Medium (34-66%)] [N/A:N/A] Granulation Quality:  [1:Red] [2:Red] [N/A:N/A] Necrotic Amount: [1:Medium (34-66%)] [2:Medium (34-66%)] [N/A:N/A] Exposed Structures: [N/A:N/A] Fascia: No Fascia: No Fat: No Fat: No Tendon: No Tendon: No Muscle: No Muscle: No Joint: No Joint: No Bone: No Bone: No Limited to Skin Limited to Skin Breakdown Breakdown Epithelialization: Large (67-100%) Small (1-33%) N/A Periwound Skin Texture: Edema: Yes Edema: Yes N/A Excoriation: Yes Excoriation: Yes Induration: No Induration: No Callus: No Callus: No Crepitus: No Crepitus: No Fluctuance: No Fluctuance: No Friable: No Friable: No Rash: No Rash: No Scarring: No Scarring: No Periwound Skin Maceration: Yes Maceration: Yes N/A Moisture: Moist: Yes Moist: Yes Dry/Scaly: No Dry/Scaly: No Periwound Skin Color: Rubor: Yes Erythema: Yes N/A Atrophie Blanche: No Hemosiderin Staining: Yes Cyanosis: No Rubor: Yes Ecchymosis: No Atrophie Blanche: No Erythema: No Cyanosis: No Hemosiderin Staining: No Ecchymosis: No Mottled: No Mottled: No Pallor: No Pallor: No Erythema Location: N/A Circumferential N/A Temperature: No Abnormality No Abnormality N/A Tenderness on Yes Yes N/A Palpation: Wound Preparation: Ulcer Cleansing: Other: Ulcer Cleansing: Other: N/A soap and water soap and water Topical Anesthetic Topical Anesthetic Applied: Other: lidocaine Applied: Other: lidocaine 4% 4% Treatment Notes Electronic Signature(s) Signed: 12/06/2016 12:21:12 PM By: Alric Quan Entered By: Alric Quan on 12/06/2016 09:14:55 [name] ([id number]) -------------------------------------------------------------------------------- Multi-Disciplinary Care Plan Details Patient Name: TENEIL, SHILLER. Date of Service: 12/06/2016 8:45 AM Medical Record Number: [id number] Patient Account Number: [id number] Date of Birth/Sex: [birth year] (65 y.o. Female) Treating RN: Ahmed Prima Primary Care Physician: FITZGERALD, DAVID Other  Clinician: Referring Physician: FITZGERALD, DAVID Treating Physician/Extender: Frann Rider in Treatment: 51 Active Inactive Orientation to the Wound Care Program Nursing Diagnoses: Knowledge deficit related to the wound healing center program Goals: Patient/caregiver will verbalize understanding of the [facility] Date Initiated: 06/21/2016 Goal Status: Active Interventions: Provide education on orientation to the wound center Notes: Wound/Skin Impairment Nursing Diagnoses: Impaired tissue integrity Goals: Patient/caregiver will verbalize understanding of skin care regimen Date Initiated: 06/21/2016 Goal Status: Active Ulcer/skin breakdown will have a volume reduction of 30% by week 4 Date Initiated: 06/21/2016 Goal Status: Active Ulcer/skin breakdown will have a volume reduction of 50% by week 8 Date Initiated: 06/21/2016 Goal Status: Active Ulcer/skin breakdown will have a volume reduction of 80% by week 12 Date Initiated: 06/21/2016 Goal Status: Active Ulcer/skin breakdown will heal within 14 weeks Date Initiated: 06/21/2016 Goal Status: Active DREYAH, MONTROSE ([id number]) Interventions: Assess patient/caregiver ability to obtain necessary supplies Assess patient/caregiver ability to perform ulcer/skin care regimen upon admission and as needed Assess ulceration(s) every visit Provide education on ulcer and skin care Notes: Electronic Signature(s) Signed: 12/06/2016 12:21:12 PM By: Alric Quan Entered By: Alric Quan on 12/06/2016 09:14:47 [name] ([id number]) -------------------------------------------------------------------------------- Pain Assessment Details Patient Name: SALLYANN, KINNAIRD. Date of Service: 12/06/2016 8:45 AM Medical Record Number: [id number] Patient Account Number: [id number] Date of Birth/Sex: April 14, 1951 (65 y.o. Female) Treating RN: Ahmed Prima Primary Care Physician: [place], [place]  Other  Clinician: Referring Physician: FITZGERALD, DAVID Treating Physician/Extender: Frann Rider in Treatment: 24 Active Problems Location of Pain Severity and Description of Pain Patient Has Paino No Site Locations With Dressing Change: No Pain Management and Medication Current Pain Management: Electronic Signature(s) Signed: 12/06/2016 12:21:12 PM By: Alric Quan Entered By: Alric Quan on 12/06/2016 08:52:07 [name] ([id number]) -------------------------------------------------------------------------------- Patient/Caregiver Education Details Patient Name: MARVELYN, BOUCHILLON. Date of Service: 12/06/2016 8:45 AM Medical Record Number: [id number] Patient Account Number: [id number] Date of Birth/Gender: 1951-07-09 (65 y.o. Female) Treating RN: Ahmed Prima Primary Care Physician: FITZGERALD, DAVID Other Clinician: Referring Physician: FITZGERALD, DAVID Treating Physician/Extender: Frann Rider in Treatment: 24 Education Assessment Education Provided To: Patient Education Topics Provided Wound/Skin Impairment: Handouts: Other: change dressing as ordered, do not get wrap wet Methods: Demonstration, Explain/Verbal Responses: State content correctly Electronic Signature(s) Signed: 12/06/2016 12:21:12 PM By: Alric Quan Entered By: Alric Quan on 12/06/2016 10:06:46 [name] ([id number]) -------------------------------------------------------------------------------- Wound Assessment Details Patient Name: JENENE, KAUFFMANN. Date of Service: 12/06/2016 8:45 AM Medical Record Number: [id number] Patient Account Number: [id number] Date of Birth/Sex: Apr 06, 1951 (65 y.o. Female) Treating RN: Ahmed Prima Primary Care Physician: FITZGERALD, DAVID Other Clinician: Referring Physician: FITZGERALD, DAVID Treating Physician/Extender: Frann Rider in Treatment: 24 Wound Status Wound Number: 1 Primary Etiology:  Lymphedema Wound Location: Left Lower Leg - Wound Status: Open Circumfernential Wounding Event: Gradually Appeared Date Acquired: 12/31/2015 Weeks Of Treatment: 24 Clustered Wound: No Photos Photo Uploaded By: Alric Quan on 12/06/2016 11:50:36 Wound Measurements Length: (cm) 8 Width: (cm) 13 Depth: (cm) 0.2 Area: (cm) 81.681 Volume: (cm) 16.336 % Reduction in Area: 18.8% % Reduction in Volume: 18.8% Epithelialization: Large (67-100%) Tunneling: No Undermining: No Wound Description Classification: Partial Thickness Wound Margin: Flat and Intact Exudate Amount: Large Exudate Type: Serous Exudate Color: amber Foul Odor After Cleansing: Yes Due to Product Use: No Wound Bed Granulation Amount: Medium (34-66%) Exposed Structure Granulation Quality: Red Fascia Exposed: No Necrotic Amount: Medium (34-66%) Fat Layer Exposed: No SERI, KIMMER ([id number]) Necrotic Quality: Adherent Slough Tendon Exposed: No Muscle Exposed: No Joint Exposed: No Bone Exposed: No Limited to Skin Breakdown Periwound Skin Texture Texture Color No Abnormalities Noted: No No Abnormalities Noted: No Callus: No Atrophie Blanche: No Crepitus: No Cyanosis: No Excoriation: Yes Ecchymosis: No Fluctuance: No Erythema: No Friable: No Hemosiderin Staining: No Induration: No Mottled: No Localized Edema: Yes Pallor: No Rash: No Rubor: Yes Scarring: No Temperature / Pain Moisture Temperature: No Abnormality No Abnormalities Noted: No Tenderness on Palpation: Yes Dry / Scaly: No Maceration: Yes Moist: Yes Wound Preparation Ulcer Cleansing: Other: soap and water, Topical Anesthetic Applied: Other: lidocaine 4%, Treatment Notes Wound #1 (Left, Circumferential Lower Leg) 1. Cleansed with: Clean wound with Normal Saline Cleanse wound with antibacterial soap and water 2. Anesthetic Topical Lidocaine 4% cream to wound bed prior to debridement 3. Peri-wound Care: Moisturizing  lotion 4. Dressing Applied: Aquacel Ag Santyl Ointment 5. Secondary Dressing Applied ABD Pad 7. Secured with Tape 3 Layer Compression System - Bilateral Notes GRACEANN, BOILEAU ([id number]) Louretta [place] to anchor Electronic Signature(s) Signed: 12/06/2016 12:21:12 PM By: Alric Quan Entered By: Alric Quan on 12/06/2016 09:09:26 [name] ([id number]) -------------------------------------------------------------------------------- Wound Assessment Details Patient Name: CARLENE, BICKLEY. Date of Service: 12/06/2016 8:45 AM Medical Record Number: [id number] Patient Account Number: [id number] Date of Birth/Sex: Jun 17, 1951 (65 y.o. Female) Treating RN: Ahmed Prima Primary Care Physician: FITZGERALD, DAVID Other Clinician: Referring Physician: FITZGERALD, DAVID Treating Physician/Extender: Frann Rider in Treatment: 63  Wound Status Wound Number: 2 Primary Etiology: Lymphedema Wound Location: Right Lower Leg - Wound Status: Open Circumfernential Wounding Event: Gradually Appeared Date Acquired: 12/31/2015 Weeks Of Treatment: 24 Clustered Wound: Yes Photos Photo Uploaded By: Alric Quan on 12/06/2016 11:50:50 Wound Measurements Length: (cm) 10.5 Width: (cm) 1.5 Depth: (cm) 0.2 Clustered Quantity: 3 Area: (cm) 12.37 Volume: (cm) 2.474 % Reduction in Area: 94.5% % Reduction in Volume: 94.5% Epithelialization: Small (1-33%) Tunneling: No Undermining: No Wound Description Classification: Partial Thickness Wound Margin: Indistinct, nonvisible Exudate Amount: Large Exudate Type: Serous Exudate Color: amber Foul Odor After Cleansing: Yes Due to Product Use: No Wound Bed Granulation Amount: Medium (34-66%) Exposed Structure Granulation Quality: Red Fascia Exposed: No LEONARD, HENDLER ([id number]) Necrotic Amount: Medium (34-66%) Fat Layer Exposed: No Necrotic Quality: Adherent Slough Tendon Exposed: No Muscle Exposed: No Joint  Exposed: No Bone Exposed: No Limited to Skin Breakdown Periwound Skin Texture Texture Color No Abnormalities Noted: No No Abnormalities Noted: No Callus: No Atrophie Blanche: No Crepitus: No Cyanosis: No Excoriation: Yes Ecchymosis: No Fluctuance: No Erythema: Yes Friable: No Erythema Location: Circumferential Induration: No Hemosiderin Staining: Yes Localized Edema: Yes Mottled: No Rash: No Pallor: No Scarring: No Rubor: Yes Moisture Temperature / Pain No Abnormalities Noted: No Temperature: No Abnormality Dry / Scaly: No Tenderness on Palpation: Yes Maceration: Yes Moist: Yes Wound Preparation Ulcer Cleansing: Other: soap and water, Topical Anesthetic Applied: Other: lidocaine 4%, Treatment Notes Wound #2 (Right, Circumferential Lower Leg) 1. Cleansed with: Clean wound with Normal Saline Cleanse wound with antibacterial soap and water 2. Anesthetic Topical Lidocaine 4% cream to wound bed prior to debridement 3. Peri-wound Care: Moisturizing lotion 4. Dressing Applied: Aquacel Ag Santyl Ointment 5. Secondary Dressing Applied ABD Pad 7. Secured with Tape 3 Layer Compression System - Bilateral MERCADES, BAJAJ ([id number]) Notes unna to anchor Electronic Signature(s) Signed: 12/06/2016 12:21:12 PM By: Alric Quan Entered By: Alric Quan on 12/06/2016 09:08:05 [name] ([id number]) -------------------------------------------------------------------------------- Vitals Details Patient Name: JAYCELYNN, KNICKERBOCKER. Date of Service: 12/06/2016 8:45 AM Medical Record Number: [id number] Patient Account Number: [id number] Date of Birth/Sex: 1951/09/11 (65 y.o. Female) Treating RN: Ahmed Prima Primary Care Physician: FITZGERALD, DAVID Other Clinician: Referring Physician: FITZGERALD, DAVID Treating Physician/Extender: Frann Rider in Treatment: 24 Vital Signs Time Taken: 08:59 Temperature (F): 98.1 Height (in): 67 Pulse  (bpm): 72 Weight (lbs): 300 Respiratory Rate (breaths/min): 18 Body Mass Index (BMI): 47 Blood Pressure (mmHg): 180/90 Reference Range: 80 - 120 mg / dl Notes Pt states that she has not had her BP medication this morning. She states that she will take it when she gets home and can eat something. BP taken manually. Electronic Signature(s) Signed: 12/06/2016 12:21:12 PM By: Alric Quan Entered By: Alric Quan on 12/06/2016 09:39:15

## 2016-12-13 ENCOUNTER — Encounter: Payer: 59 | Admitting: Surgery

## 2016-12-13 DIAGNOSIS — E11622 Type 2 diabetes mellitus with other skin ulcer: Secondary | ICD-10-CM | POA: Diagnosis not present

## 2016-12-14 NOTE — Progress Notes (Signed)
KELCEE, BJORN ([id number]) Visit Report for 12/13/2016 Chief Complaint Document Details Patient Name: Brooke Hopkins, Brooke Hopkins 12/13/2016 8:45 Date of Service: AM Medical Record [id number] Number: Patient Account Number: [id number] [birth year] (65 y.o. Treating RN: Ahmed Prima Date of Birth/Sex: Female) Other Clinician: Primary Care Physician: FITZGERALD, DAVID Treating Christin Fudge Referring Physician: FITZGERALD, DAVID Physician/Extender: Suella Grove in Treatment: 25 Information Obtained from: Patient Chief Complaint Brooke Hopkins returns for follow-up to her bilateral lower extremity ulcers Electronic Signature(s) Signed: 12/13/2016 9:25:47 AM By: Christin Fudge MD, FACS Entered By: Christin Fudge on 12/13/2016 09:25:47 Brooke Hopkins ([id number]) -------------------------------------------------------------------------------- Debridement Details Patient Name: Brooke Hopkins, Brooke Hopkins 12/13/2016 8:45 Date of Service: AM Medical Record [id number] Number: Patient Account Number: [id number] February 11, 1951 (65 y.o. Treating RN: Ahmed Prima Date of Birth/Sex: Female) Other Clinician: Primary Care Physician: FITZGERALD, DAVID Treating Christin Fudge Referring Physician: FITZGERALD, DAVID Physician/Extender: Suella Grove in Treatment: 25 Debridement Performed for Wound #1 Left,Posterior Lower Leg Assessment: Performed By: Physician Christin Fudge, MD Debridement: Debridement Pre-procedure Yes - 09:13 Verification/Time Out Taken: Start Time: 09:16 Pain Control: Lidocaine 4% Topical Solution Level: Skin/Subcutaneous Tissue Total Area Debrided (L x 4 (cm) x 7 (cm) = 28 (cm) W): Tissue and other Viable, Non-Viable, Exudate, Fibrin/Slough, Subcutaneous material debrided: Instrument: Curette Bleeding: Minimum Hemostasis Achieved: Pressure End Time: 09:18 Procedural Pain: 0 Post Procedural Pain: 0 Response to Treatment: Procedure was tolerated well Post Debridement Measurements of  Total Wound Length: (cm) 4 Width: (cm) 7 Depth: (cm) 0.2 Volume: (cm) 4.398 Character of Wound/Ulcer Post Requires Further Debridement Debridement: Severity of Tissue Post Debridement: Fat layer exposed Post Procedure Diagnosis Same as Pre-procedure Electronic Signature(s) Signed: 12/13/2016 9:25:31 AM By: Christin Fudge MD, FACS Signed: 12/13/2016 4:21:45 PM By: Tedra Coupe ([id number]) Entered By: Christin Fudge on 12/13/2016 09:25:30 Brooke Hopkins ([id number]) -------------------------------------------------------------------------------- Debridement Details Patient Name: Brooke Hopkins, [name] 12/13/2016 8:45 Date of Service: AM Medical Record [id number] Number: Patient Account Number: [id number] [birth year] (65 y.o. Treating RN: Ahmed Prima Date of Birth/Sex: Female) Other Clinician: Primary Care Physician: FITZGERALD, DAVID Treating Christin Fudge Referring Physician: FITZGERALD, DAVID Physician/Extender: Suella Grove in Treatment: 25 Debridement Performed for Wound #2 Right,Posterior Lower Leg Assessment: Performed By: Physician Christin Fudge, MD Debridement: Debridement Pre-procedure Yes - 09:13 Verification/Time Out Taken: Start Time: 09:14 Pain Control: Lidocaine 4% Topical Solution Level: Skin/Subcutaneous Tissue Total Area Debrided (L x 1 (cm) x 1.5 (cm) = 1.5 (cm) W): Tissue and other Viable, Non-Viable, Exudate, Fibrin/Slough, Subcutaneous material debrided: Instrument: Curette Bleeding: Minimum Hemostasis Achieved: Pressure End Time: 09:16 Procedural Pain: 0 Post Procedural Pain: 0 Response to Treatment: Procedure was tolerated well Post Debridement Measurements of Total Wound Length: (cm) 1 Width: (cm) 1.5 Depth: (cm) 0.2 Volume: (cm) 0.236 Character of Wound/Ulcer Post Requires Further Debridement Debridement: Severity of Tissue Post Debridement: Fat layer exposed Post Procedure Diagnosis Same as  Pre-procedure Electronic Signature(s) Signed: 12/13/2016 9:25:39 AM By: Christin Fudge MD, FACS Signed: 12/13/2016 4:21:45 PM By: Tedra Coupe ([id number]) Entered By: Christin Fudge on 12/13/2016 09:25:38 Brooke Hopkins, Brooke Hopkins ([id number]) -------------------------------------------------------------------------------- HPI Details Patient Name: Brooke Hopkins 12/13/2016 8:45 Date of Service: AM Medical Record [id number] Number: Patient Account Number: [id number] 1951-01-24 (65 y.o. Treating RN: Ahmed Prima Date of Birth/Sex: Female) Other Clinician: Primary Care Physician: FITZGERALD, DAVID Treating Christin Fudge Referring Physician: FITZGERALD, DAVID Physician/Extender: Weeks in Treatment: 25 History of Present Illness Location: massive swelling of both lower extremities and ulceration both lower extremities Quality: Patient reports experiencing a dull pain to affected area(s). Severity: Patient  states wound are getting worse. Duration: Patient has had the wound for >2 years prior to seeking treatment at the wound center Timing: Pain in wound is constant (hurts all the time) Context: The wound appeared gradually over time Modifying Factors: Other treatment(s) tried include:she has a lymphedema pump but uses it seldom and she's had several course of antibiotics Associated Signs and Symptoms: Patient reports having difficulty standing for long periods. HPI Description: 65 year old patient seen by Dr. Ola Spurr of infectious disease who has been following up for left lower extremity cellulitis and ulcer with recurrent bilateral lower extremity problems for several months. Recently she had a large right lower extremity bullae which opened out and has been ulcerated. She has seen the vascular group and has been getting Unna's wraps and has a lymphedema pump used in the past. Her prior cultures were positive for Pseudomonas, Proteus and was treated with  amoxicillin. He has also been treated with 2 weeks course of ciprofloxacin and amoxicillin.. Increase of Lasix dose helped with the edema and echo showed no systolic CHF but may be diastolic problems. Past medical history significant for diabetes mellitus type 2, venous stasis ulcer, obesity, diabetic peripheral neuropathy, status post back surgery, cholecystectomy, hysterectomy, arthroscopy of the knee. He is a former smoker and quit smoking in 1984. The patient has seen Dr. Delana Meyer who did not recommend any arterial or venous duplex studies and has been using Unna's wraps and also recommended a lymphedema pump. she has been very noncompliant with using these. 06/28/2016 -- the patient is still on antibiotics as prescribed by Dr. Ola Spurr and he is asked her to take it for 3 weeks. The patient also says she has a lot of redness and pain in the folds of her thigh and lower extremity and this is very painful. 07/26/2016 -- the patient is off antibiotics and has been getting dressing changes 3 times a week. 08/09/2016 -- is been on Cipro and is taking potassium supplements along with her Lasix and is going to be seeing Dr. Ola Spurr for a consult return visit only in November 2017. 08/23/2016 -- she was admitted to the hospital last week and I have reviewed these reports in detail. She was seen by Dr. Ola Spurr who noted a Pseudomonas infection which was resistant to ciprofloxacin and discharged her on Ceftazidime 1 g IV every 12 hourly anyone days. The antibiotic was to be stopped on September 11 and he would see her back in the clinic. NALANIE, WINIECKI ([id number]) 08/30/2016 -- had a communication from Dr. Ola Spurr that he would extend her antibiotics by a week if she continued to look like cellulitis was persisting. 09/13/2016 -- the PICC line is out and antibiotics have stopped. 10/04/16: returns today for f/u. reports utilizes compression pump twice daily. she is compliant with  her compression therapy. she reports a new blister on the right proximal posterior calf. no systemic s/s of infection. 10/11/16: returns today for f/u. she reports adherence to her compression pumps, but there is no improvement regarding her BLE edema. there is weeping of fluid. denies fever, chills, body aches or malaise. 10/18/2016 -- she has a significant cellulitis of her right lower extremity which was not there last week. I believe at this stage she will need the expertise of Dr. Ola Spurr to decide on antibiotic coverage. 10/25/2016 -- the patient has had cipro and ampicillin been started by Dr. Ola Spurr and he is awaiting further cultures. Overall she's been feeling better. 11/08/2016 -- Dr. Ola Spurr this week who  has continued with ciprofloxacin and amoxicillin and is awaiting further cultures before deciding to place her on a PICC line. 11/15/2016 -- Dr. Blane Ohara note was reviewed and her cultures growing Pseudomonas in both legs which is sensitive to Cipro and the other is resistant to Cipro and she would get a PICC line and start IV antibiotics -- Ceftazidime 1 g q 12 for 3 weeks. 11-29-16 Mrs. Barbato presents today in follow-up of her bilateral lower exterminators ulcers. She remains on IV antibiotic therapy via a PICC line per Dr. Ola Spurr; she has a follow-up appointment with him on Monday, 12/02/2016. She anticipates that the about therapy will be discontinued at that appointment. She denies any complications of nausea vomiting and/or diarrhea accompanied by this antibiotic therapy. She admits to using her lymphedema pumps twice daily with the exception of yesterday. She denies any concerns or complications with the current treatment plan. 12/06/2016 -- Dr. Ola Spurr saw her recently on 12/02/2016 and due to the fact she has had significant problems with recurrent ulceration and Pseudomonas infection he recommended 3 more weeks of antibiotics and extended it until  December 25. The PICC line will be in place. Last hemoglobin A1c on November 30 was 9% Electronic Signature(s) Signed: 12/13/2016 9:26:11 AM By: Christin Fudge MD, FACS Entered By: Christin Fudge on 12/13/2016 09:26:11 Brooke Hopkins, Brooke Hopkins ([id number]) -------------------------------------------------------------------------------- Physical Exam Details Patient Name: NECIA, KAMM 12/13/2016 8:45 Date of Service: AM Medical Record [id number] Number: Patient Account Number: [id number] 15-Oct-1951 (65 y.o. Treating RN: Ahmed Prima Date of Birth/Sex: Female) Other Clinician: Primary Care Physician: FITZGERALD, DAVID Treating Christin Fudge Referring Physician: FITZGERALD, DAVID Physician/Extender: Weeks in Treatment: 25 Constitutional . Pulse regular. Respirations normal and unlabored. Afebrile. . Eyes Nonicteric. Reactive to light. Ears, Nose, Mouth, and Throat Lips, teeth, and gums WNL.Marland Kitchen Moist mucosa without lesions. Neck supple and nontender. No palpable supraclavicular or cervical adenopathy. Normal sized without goiter. Respiratory WNL. No retractions.. Cardiovascular Pedal Pulses WNL. No clubbing, cyanosis or edema. Lymphatic No adneopathy. No adenopathy. No adenopathy. Musculoskeletal Adexa without tenderness or enlargement.. Digits and nails w/o clubbing, cyanosis, infection, petechiae, ischemia, or inflammatory conditions.. Integumentary (Hair, Skin) No suspicious lesions. No crepitus or fluctuance. No peri-wound warmth or erythema. No masses.Marland Kitchen Psychiatric Judgement and insight Intact.. No evidence of depression, anxiety, or agitation.. Notes the patient's lymphedema is looking much better today and she has got good resolution of the weeping of her lower extremities. The ulcerated areas on both posterior calves continue to have a lot of fibrotic debris and I'm sharply remove this with a #3 curet and bleeding controlled with pressure Electronic  Signature(s) Signed: 12/13/2016 9:26:48 AM By: Christin Fudge MD, FACS Entered By: Christin Fudge on 12/13/2016 09:26:48 Brooke Hopkins ([id number]) -------------------------------------------------------------------------------- Physician Orders Details Patient Name: AINO, [name] 12/13/2016 8:45 Date of Service: AM Medical Record [id number] Number: Patient Account Number: [id number] 1951/08/17 (65 y.o. Treating RN: Ahmed Prima Date of Birth/Sex: Female) Other Clinician: Primary Care Physician: FITZGERALD, DAVID Treating Christin Fudge Referring Physician: FITZGERALD, DAVID Physician/Extender: Suella Grove in Treatment: 25 Verbal / Phone Orders: Yes Clinician: Carolyne Fiscal, Debi Read Back and Verified: Yes Diagnosis Coding Wound Cleansing Wound #1 Left,Posterior Lower Leg o Clean wound with Normal Saline. - clinic use o Cleanse wound with mild soap and water - HHRN please scrub wounds with mild soap and water o May Shower, gently pat wound dry prior to applying new dressing. o May shower with protection. Wound #2 Right,Posterior Lower Leg o Clean wound with Normal Saline. - clinic use   o Cleanse wound with mild soap and water - HHRN please scrub wounds with mild soap and water o May Shower, gently pat wound dry prior to applying new dressing. o May shower with protection. Anesthetic Wound #1 Left,Posterior Lower Leg o Topical Lidocaine 4% cream applied to wound bed prior to debridement - clinic use Wound #2 Right,Posterior Lower Leg o Topical Lidocaine 4% cream applied to wound bed prior to debridement - clinic use Skin Barriers/Peri-Wound Care Wound #1 Left,Posterior Lower Leg o Other: - Lotrisone cream in folds at knees and on reddened areas on legs (Nystatin used in clinic) Wound #2 Right,Posterior Lower Leg o Other: - Lotrisone cream in folds at knees and on reddened areas on legs (Nystatin used in clinic) Primary Wound Dressing Wound #1  Left,Posterior Lower Leg o Santyl Ointment - use on the posterior leg wounds Wound #2 Right,Posterior Lower Leg o Santyl Ointment - use on the posterior leg wounds Brooke Hopkins, Brooke Hopkins ([id number]) Secondary Dressing Wound #1 Left,Posterior Lower Leg o ABD pad o Dry Gauze Wound #2 Right,Posterior Lower Leg o ABD pad o Dry Gauze Dressing Change Frequency Wound #1 Left,Posterior Lower Leg o Change Dressing Monday, Wednesday, Friday - HHRN to change wraps Monday and Wednesday Wound #2 Right,Posterior Lower Leg o Change Dressing Monday, Wednesday, Friday - HHRN to change wraps Monday and Wednesday o Change Dressing Monday, Wednesday, Friday - HHRN to change wraps Monday and Wednesday Follow-up Appointments Wound #1 Left,Posterior Lower Leg o Return Appointment in 2 weeks. Wound #2 Right,Posterior Lower Leg o Return Appointment in 2 weeks. Edema Control Wound #1 Left,Posterior Lower Leg o 3 Layer Compression System - Bilateral - May anchor top of wrap with dome paste, but only wrap around leg one time. o Elevate legs to the level of the heart and pump ankles as often as possible o Compression Pump: Use compression pump on left lower extremity for 30 minutes, twice daily. o Compression Pump: Use compression pump on right lower extremity for 30 minutes, twice daily. Wound #2 Right,Posterior Lower Leg o 3 Layer Compression System - Bilateral - May anchor top of wrap with dome paste, but only wrap around leg one time. o Elevate legs to the level of the heart and pump ankles as often as possible o Compression Pump: Use compression pump on left lower extremity for 30 minutes, twice daily. o Compression Pump: Use compression pump on right lower extremity for 30 minutes, twice daily. Additional Orders / Instructions o Increase protein intake. Home Health Wound #1 Left,Posterior Lower Leg Brooke Hopkins, Brooke Hopkins ([id number]) o [place]  Visits - [facility]---*****HHRN Please see pt next Friday 12/20/16 and we will see her the following Friday after Christmas***** o Home Health Nurse may visit PRN to address patientos wound care needs. o FACE TO FACE ENCOUNTER: MEDICARE and MEDICAID PATIENTS: I certify that this patient is under my care and that I had a face-to-face encounter that meets the physician face-to-face encounter requirements with this patient on this date. The encounter with the patient was in whole or in part for the following MEDICAL CONDITION: (primary reason for [place]) MEDICAL NECESSITY: I certify, that based on my findings, NURSING services are a medically necessary home health service. HOME BOUND STATUS: I certify that my clinical findings support that this patient is homebound (i.e., Due to illness or injury, pt requires aid of supportive devices such as crutches, cane, wheelchairs, walkers, the use of special transportation or the assistance of another person to leave their place of  residence. There is a normal inability to leave the home and doing so requires considerable and taxing effort. Other absences are for medical reasons / religious services and are infrequent or of short duration when for other reasons). o Please direct any NON-WOUND related issues/requests for orders to patient's Primary Care Physician Wound #2 [place] Visits - [facility]---*****HHRN Please see pt next Friday 12/20/16 and we will see her the following Friday after Christmas***** o Home Health Nurse may visit PRN to address patientos wound care needs. o FACE TO FACE ENCOUNTER: MEDICARE and MEDICAID PATIENTS: I certify that this patient is under my care and that I had a face-to-face encounter that meets the physician face-to-face encounter requirements with this patient on this date. The encounter with the patient was in whole or in part for the following MEDICAL CONDITION:  (primary reason for [place]) MEDICAL NECESSITY: I certify, that based on my findings, NURSING services are a medically necessary home health service. HOME BOUND STATUS: I certify that my clinical findings support that this patient is homebound (i.e., Due to illness or injury, pt requires aid of supportive devices such as crutches, cane, wheelchairs, walkers, the use of special transportation or the assistance of another person to leave their place of residence. There is a normal inability to leave the home and doing so requires considerable and taxing effort. Other absences are for medical reasons / religious services and are infrequent or of short duration when for other reasons). o Please direct any NON-WOUND related issues/requests for orders to patient's Primary Care Physician Electronic Signature(s) Signed: 12/13/2016 3:52:57 PM By: Christin Fudge MD, FACS Signed: 12/13/2016 4:21:45 PM By: Alric Quan Entered By: Alric Quan on 12/13/2016 09:20:23 Brooke Hopkins ([id number]) -------------------------------------------------------------------------------- Problem List Details Patient Name: Brooke Hopkins, Brooke Hopkins 12/13/2016 8:45 Date of Service: AM Medical Record [id number] Number: Patient Account Number: [id number] 14-Apr-1951 (65 y.o. Treating RN: Ahmed Prima Date of Birth/Sex: Female) Other Clinician: Primary Care Physician: FITZGERALD, DAVID Treating Christin Fudge Referring Physician: FITZGERALD, DAVID Physician/Extender: Suella Grove in Treatment: 25 Active Problems ICD-10 Encounter Code Description Active Date Diagnosis E11.622 Type 2 diabetes mellitus with other skin ulcer 10/25/2016 Yes I89.0 Lymphedema, not elsewhere classified 10/25/2016 Yes L97.222 Non-pressure chronic ulcer of left calf with fat layer 10/25/2016 Yes exposed L97.212 Non-pressure chronic ulcer of right calf with fat layer 10/25/2016 Yes exposed E66.01 Morbid (severe) obesity due  to excess calories 10/25/2016 Yes Inactive Problems Resolved Problems Electronic Signature(s) Signed: 12/13/2016 9:25:16 AM By: Christin Fudge MD, FACS Entered By: Christin Fudge on 12/13/2016 09:25:16 Brooke Hopkins ([id number]) -------------------------------------------------------------------------------- Progress Note Details Patient Name: Brooke Hopkins, MCALLISTER. 12/13/2016 8:45 Date of Service: AM Medical Record [id number] Number: Patient Account Number: [id number] 1951-01-12 (65 y.o. Treating RN: Ahmed Prima Date of Birth/Sex: Female) Other Clinician: Primary Care Physician: FITZGERALD, DAVID Treating Christin Fudge Referring Physician: FITZGERALD, DAVID Physician/Extender: Suella Grove in Treatment: 25 Subjective Chief Complaint Information obtained from Patient Mrs. Kroeker returns for follow-up to her bilateral lower extremity ulcers History of Present Illness (HPI) The following HPI elements were documented for the patient's wound: Location: massive swelling of both lower extremities and ulceration both lower extremities Quality: Patient reports experiencing a dull pain to affected area(s). Severity: Patient states wound are getting worse. Duration: Patient has had the wound for >2 years prior to seeking treatment at the wound center Timing: Pain in wound is constant (hurts all the time) Context: The wound appeared gradually over time Modifying Factors: Other treatment(s) tried include:she has  a lymphedema pump but uses it seldom and she's had several course of antibiotics Associated Signs and Symptoms: Patient reports having difficulty standing for long periods. 65 year old patient seen by Dr. Ola Spurr of infectious disease who has been following up for left lower extremity cellulitis and ulcer with recurrent bilateral lower extremity problems for several months. Recently she had a large right lower extremity bullae which opened out and has been ulcerated. She has seen  the vascular group and has been getting Unna's wraps and has a lymphedema pump used in the past. Her prior cultures were positive for Pseudomonas, Proteus and was treated with amoxicillin. He has also been treated with 2 weeks course of ciprofloxacin and amoxicillin.. Increase of Lasix dose helped with the edema and echo showed no systolic CHF but may be diastolic problems. Past medical history significant for diabetes mellitus type 2, venous stasis ulcer, obesity, diabetic peripheral neuropathy, status post back surgery, cholecystectomy, hysterectomy, arthroscopy of the knee. He is a former smoker and quit smoking in 1984. The patient has seen Dr. Delana Meyer who did not recommend any arterial or venous duplex studies and has been using Unna's wraps and also recommended a lymphedema pump. she has been very noncompliant with using these. 06/28/2016 -- the patient is still on antibiotics as prescribed by Dr. Ola Spurr and he is asked her to take it for 3 weeks. The patient also says she has a lot of redness and pain in the folds of her thigh and lower extremity and this is very painful. 07/26/2016 -- the patient is off antibiotics and has been getting dressing changes 3 times a week. ZELLIE, [name] ([id number]) 08/09/2016 -- is been on Cipro and is taking potassium supplements along with her Lasix and is going to be seeing Dr. Ola Spurr for a consult return visit only in November 2017. 08/23/2016 -- she was admitted to the hospital last week and I have reviewed these reports in detail. She was seen by Dr. Ola Spurr who noted a Pseudomonas infection which was resistant to ciprofloxacin and discharged her on Ceftazidime 1 g IV every 12 hourly anyone days. The antibiotic was to be stopped on September 11 and he would see her back in the clinic. 08/30/2016 -- had a communication from Dr. Ola Spurr that he would extend her antibiotics by a week if she continued to look like cellulitis was  persisting. 09/13/2016 -- the PICC line is out and antibiotics have stopped. 10/04/16: returns today for f/u. reports utilizes compression pump twice daily. she is compliant with her compression therapy. she reports a new blister on the right proximal posterior calf. no systemic s/s of infection. 10/11/16: returns today for f/u. she reports adherence to her compression pumps, but there is no improvement regarding her BLE edema. there is weeping of fluid. denies fever, chills, body aches or malaise. 10/18/2016 -- she has a significant cellulitis of her right lower extremity which was not there last week. I believe at this stage she will need the expertise of Dr. Ola Spurr to decide on antibiotic coverage. 10/25/2016 -- the patient has had cipro and ampicillin been started by Dr. Ola Spurr and he is awaiting further cultures. Overall she's been feeling better. 11/08/2016 -- Dr. Ola Spurr this week who has continued with ciprofloxacin and amoxicillin and is awaiting further cultures before deciding to place her on a PICC line. 11/15/2016 -- Dr. Blane Ohara note was reviewed and her cultures growing Pseudomonas in both legs which is sensitive to Cipro and the other is resistant to Cipro and  she would get a PICC line and start IV antibiotics -- Ceftazidime 1 g q 12 for 3 weeks. 11-29-16 Mrs. Hillenburg presents today in follow-up of her bilateral lower exterminators ulcers. She remains on IV antibiotic therapy via a PICC line per Dr. Ola Spurr; she has a follow-up appointment with him on Monday, 12/02/2016. She anticipates that the about therapy will be discontinued at that appointment. She denies any complications of nausea vomiting and/or diarrhea accompanied by this antibiotic therapy. She admits to using her lymphedema pumps twice daily with the exception of yesterday. She denies any concerns or complications with the current treatment plan. 12/06/2016 -- Dr. Ola Spurr saw her recently on  12/02/2016 and due to the fact she has had significant problems with recurrent ulceration and Pseudomonas infection he recommended 3 more weeks of antibiotics and extended it until December 25. The PICC line will be in place. Last hemoglobin A1c on November 30 was 9% Objective Constitutional Pulse regular. Respirations normal and unlabored. Afebrile. JAYNIE, HITCH. ([id number]) Vitals Time Taken: 8:48 AM, Height: 67 in, Weight: 300 lbs, BMI: 47, Temperature: 98.1 F, Pulse: 82 bpm, Respiratory Rate: 18 breaths/min, Blood Pressure: 178/75 mmHg. General Notes: Pt states that she has not taken her BP medication this morning. Eyes Nonicteric. Reactive to light. Ears, Nose, Mouth, and Throat Lips, teeth, and gums WNL.Marland Kitchen Moist mucosa without lesions. Neck supple and nontender. No palpable supraclavicular or cervical adenopathy. Normal sized without goiter. Respiratory WNL. No retractions.. Cardiovascular Pedal Pulses WNL. No clubbing, cyanosis or edema. Lymphatic No adneopathy. No adenopathy. No adenopathy. Musculoskeletal Adexa without tenderness or enlargement.. Digits and nails w/o clubbing, cyanosis, infection, petechiae, ischemia, or inflammatory conditions.Marland Kitchen Psychiatric Judgement and insight Intact.. No evidence of depression, anxiety, or agitation.. General Notes: the patient's lymphedema is looking much better today and she has got good resolution of the weeping of her lower extremities. The ulcerated areas on both posterior calves continue to have a lot of fibrotic debris and I'm sharply remove this with a #3 curet and bleeding controlled with pressure Integumentary (Hair, Skin) No suspicious lesions. No crepitus or fluctuance. No peri-wound warmth or erythema. No masses.. Wound #1 status is Open. Original cause of wound was Gradually Appeared. The wound is located on the Left,Posterior Lower Leg. The wound measures 4cm length x 7cm width x 0.2cm depth; 21.991cm^2 area and  4.398cm^3 volume. The wound is limited to skin breakdown. There is no tunneling or undermining noted. There is a large amount of serous drainage noted. The wound margin is flat and intact. There is no granulation within the wound bed. There is a large (67-100%) amount of necrotic tissue within the wound bed including Adherent Slough. The periwound skin appearance exhibited: Excoriation, Localized Edema, Maceration, Moist, Rubor. The periwound skin appearance did not exhibit: Callus, Crepitus, Fluctuance, Friable, Induration, Rash, Scarring, Dry/Scaly, Atrophie Blanche, Cyanosis, Ecchymosis, Hemosiderin Staining, Mottled, Pallor, Erythema. Periwound temperature was noted as No Abnormality. The periwound has tenderness on palpation. Wound #2 status is Open. Original cause of wound was Gradually Appeared. The wound is located on the Right,Posterior Lower Leg. The wound measures 1cm length x 1.5cm width x 0.2cm depth; 1.178cm^2 area Brooke Hopkins, ENCALADA. ([id number]) and 0.236cm^3 volume. The wound is limited to skin breakdown. There is a large amount of serous drainage noted. The wound margin is indistinct and nonvisible. There is no granulation within the wound bed. There is a large (67-100%) amount of necrotic tissue within the wound bed including Adherent Slough. The periwound skin appearance exhibited: Excoriation,  Localized Edema, Maceration, Moist, Hemosiderin Staining, Rubor, Erythema. The periwound skin appearance did not exhibit: Callus, Crepitus, Fluctuance, Friable, Induration, Rash, Scarring, Dry/Scaly, Atrophie Blanche, Cyanosis, Ecchymosis, Mottled, Pallor. The surrounding wound skin color is noted with erythema which is circumferential. Periwound temperature was noted as No Abnormality. The periwound has tenderness on palpation. 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 There was a Skin/Subcutaneous Tissue Debridement ([id number]) debridement with total area of 28 sq cm performed by Christin Fudge, MD. with the following instrument(s): Curette to remove Viable and Non-Viable tissue/material including Exudate, Fibrin/Slough, and Subcutaneous after achieving pain control using Lidocaine 4% Topical Solution. A time out was conducted at 09:13, prior to the start of the procedure. A Minimum amount of bleeding was controlled with Pressure. The procedure was tolerated well with a pain level of 0 throughout and a pain level of 0 following the procedure. Post Debridement Measurements: 4cm length x 7cm width x 0.2cm depth; 4.398cm^3 volume. Character of Wound/Ulcer Post Debridement requires further debridement. Severity of Tissue Post Debridement is: Fat layer exposed. Post procedure Diagnosis Wound #1: Same as Pre-Procedure Wound #2 Wound #2 is a Lymphedema located on the Right,Posterior Lower Leg . There was a Skin/Subcutaneous Tissue Debridement ([id number]) debridement with total area of 1.5 sq cm performed by Christin Fudge, MD. with the following instrument(s): Curette to remove Viable and Non-Viable tissue/material including Exudate, Fibrin/Slough, and Subcutaneous after achieving pain control using Lidocaine 4% Topical Solution. A time out was conducted at 09:13, prior to the start of the procedure. A Minimum amount of bleeding was Brooke Hopkins, FREES. ([id number]) controlled with Pressure. The procedure was tolerated well with a pain level of 0 throughout and a pain level of 0 following the procedure. Post Debridement Measurements: 1cm length x 1.5cm width x 0.2cm depth; 0.236cm^3  volume. Character of Wound/Ulcer Post Debridement requires further debridement. Severity of Tissue Post Debridement is: Fat layer exposed. Post procedure Diagnosis Wound #2: Same as Pre-Procedure Plan Wound Cleansing: Wound #1 Left,Posterior Lower Leg: Clean wound with Normal Saline. - clinic use Cleanse wound with mild soap and water - HHRN please scrub wounds with mild soap and water May Shower, gently pat wound dry prior to applying new dressing. May shower with protection. Wound #2 Right,Posterior Lower Leg: Clean wound with Normal Saline. - clinic use Cleanse wound with mild soap and water - HHRN please scrub wounds with mild soap and water May Shower, gently pat wound dry prior to applying new dressing. May shower with protection. Anesthetic: Wound #1 Left,Posterior Lower Leg: Topical Lidocaine 4% cream applied to wound bed prior to debridement - clinic use Wound #2 Right,Posterior Lower Leg: Topical Lidocaine 4% cream applied to wound bed prior to debridement - clinic use Skin Barriers/Peri-Wound Care: Wound #1 Left,Posterior Lower Leg: Other: - Lotrisone cream in folds at knees and on reddened areas on legs (Nystatin used in clinic) Wound #2 Right,Posterior Lower Leg: Other: - Lotrisone cream in folds at knees and on reddened areas on legs (Nystatin used in clinic) Primary Wound Dressing: Wound #1 Left,Posterior Lower Leg: Santyl Ointment - use on the posterior leg wounds Wound #2 Right,Posterior Lower Leg: Santyl  Ointment - use on the posterior leg wounds Secondary Dressing: Wound #1 Left,Posterior Lower Leg: ABD pad Dry Gauze Wound #2 Right,Posterior Lower Leg: ABD pad Dry Gauze Dressing Change Frequency: Wound #1 Left,Posterior Lower Leg: Change Dressing Monday, Wednesday, Friday - HHRN to change wraps Monday and Wednesday Brooke Hopkins, ROLLINSON. ([id number]) Wound #2 Right,Posterior Lower Leg: Change Dressing Monday, Wednesday, Friday - HHRN to change wraps Monday  and Wednesday Change Dressing Monday, Wednesday, Friday - HHRN to change wraps Monday and Wednesday Follow-up Appointments: Wound #1 Left,Posterior Lower Leg: Return Appointment in 2 weeks. Wound #2 Right,Posterior Lower Leg: Return Appointment in 2 weeks. Edema Control: Wound #1 Left,Posterior Lower Leg: 3 Layer Compression System - Bilateral - May anchor top of wrap with dome paste, but only wrap around leg one time. Elevate legs to the level of the heart and pump ankles as often as possible Compression Pump: Use compression pump on left lower extremity for 30 minutes, twice daily. Compression Pump: Use compression pump on right lower extremity for 30 minutes, twice daily. Wound #2 Right,Posterior Lower Leg: 3 Layer Compression System - Bilateral - May anchor top of wrap with dome paste, but only wrap around leg one time. Elevate legs to the level of the heart and pump ankles as often as possible Compression Pump: Use compression pump on left lower extremity for 30 minutes, twice daily. Compression Pump: Use compression pump on right lower extremity for 30 minutes, twice daily. Additional Orders / Instructions: Increase protein intake. Home Health: Wound #1 Left,Posterior Lower Leg: Continue Home Health Visits - [facility]---*****HHRN Please see pt next Friday 12/20/16 and we will see her the following Friday after Christmas***** Home Health Nurse may visit PRN to address patient s wound care needs. FACE TO FACE ENCOUNTER: MEDICARE and MEDICAID PATIENTS: I certify that this patient is under my care and that I had a face-to-face encounter that meets the physician face-to-face encounter requirements with this patient on this date. The encounter with the patient was in whole or in part for the following MEDICAL CONDITION: (primary reason for [place]) MEDICAL NECESSITY: I certify, that based on my findings, NURSING services are a medically necessary home health service. HOME BOUND  STATUS: I certify that my clinical findings support that this patient is homebound (i.e., Due to illness or injury, pt requires aid of supportive devices such as crutches, cane, wheelchairs, walkers, the use of special transportation or the assistance of another person to leave their place of residence. There is a normal inability to leave the home and doing so requires considerable and taxing effort. Other absences are for medical reasons / religious services and are infrequent or of short duration when for other reasons). Please direct any NON-WOUND related issues/requests for orders to patient's Primary Care Physician Wound #2 Right,Posterior Lower Leg: [place] Visits - [facility]---*****HHRN Please see pt next Friday 12/20/16 and we will see her the following Friday after Christmas***** Home Health Nurse may visit PRN to address patient s wound care needs. FACE TO FACE ENCOUNTER: MEDICARE and MEDICAID PATIENTS: I certify that this patient is under my care and that I had a face-to-face encounter that meets the physician face-to-face encounter requirements with this patient on this date. The encounter with the patient was in whole or in part for the following MEDICAL CONDITION: (primary reason for [place]) MEDICAL NECESSITY: I certify, that based on my findings, NURSING services are a medically necessary home health service. HOME BOUND STATUS: I certify that my clinical findings  support that this patient is homebound (i.e., Due to illness or injury, pt requires aid of supportive devices such as crutches, cane, wheelchairs, walkers, the use of special transportation or the assistance of another person to leave their place of residence. There is a Brooke Hopkins, ARAMBULA. ([id number]) normal inability to leave the home and doing so requires considerable and taxing effort. Other absences are for medical reasons / religious services and are infrequent or of short duration when for  other reasons). Please direct any NON-WOUND related issues/requests for orders to patient's Primary Care Physician I have recommended: 1. silver alginate and drawtex and a 3 layer Profore. 2. She will continue with elevation and exercise and her lymphedema pumps and 3. Santyl to be applied at the posterior part of her wounds 4. antibiotics as per Dr. Ola Spurr. 5. changing her dressings twice a week. Electronic Signature(s) Signed: 12/13/2016 9:27:13 AM By: Christin Fudge MD, FACS Entered By: Christin Fudge on 12/13/2016 09:27:13 Brooke Hopkins ([id number]) -------------------------------------------------------------------------------- SuperBill Details Patient Name: BRETTNEY, FICKEN. Date of Service: 12/13/2016 Medical Record Number: [id number] Patient Account Number: [id number] Date of Birth/Sex: 1951-07-19 (65 y.o. 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 E66.01 Morbid (severe) obesity due to excess calories Facility Procedures CPT4 Code: [id number] Description: 61950 - DEB SUBQ TISSUE 20 SQ CM/< ICD-10 Description Diagnosis E11.622 Type 2 diabetes mellitus with other skin ulcer I89.0 Lymphedema, not elsewhere classified L97.222 Non-pressure chronic ulcer of left calf with fat l Modifier: ayer exposed Quantity: 1 CPT4 Code: [id number] Description: 11045 - DEB SUBQ TISS EA ADDL 20CM ICD-10 Description Diagnosis E11.622 Type 2 diabetes mellitus with other skin ulcer I89.0 Lymphedema, not elsewhere classified L97.212 Non-pressure chronic  ulcer of right calf with fat Modifier: layer exposed Quantity: 1 Physician Procedures CPT4 Code: [id number] Description: 11042 - WC PHYS SUBQ TISS 20 SQ CM ICD-10 Description Diagnosis E11.622 Type 2 diabetes mellitus with other skin ulcer I89.0 Lymphedema, not elsewhere classified L97.222 Non-pressure chronic ulcer of left calf with fat la Modifier: yer exposed Quantity: 1 CPT4 Code: [id number] Lilja, LI Description: 11045 - WC PHYS SUBQ TISS EA ADDL 20 CM NDA M. ([id number]) Modifier: Quantity: 1 Electronic Signature(s) Signed: 12/13/2016 9:27:27 AM By: Christin Fudge MD, FACS Entered By: Christin Fudge on 12/13/2016 79:02:40

## 2016-12-14 NOTE — Progress Notes (Signed)
NYEEMA, WANT ([id number]) Visit Report for 12/13/2016 Arrival Information Details Patient Name: Brooke Hopkins, Brooke Hopkins. Date of Service: 12/13/2016 8:45 AM Medical Record Number: [id number] Patient Account Number: [id number] Date of Birth/Sex: 03-03-1951 (65 y.o. Female) Treating RN: Ahmed Prima Primary Care Physician: FITZGERALD, DAVID Other Clinician: Referring Physician: FITZGERALD, DAVID Treating Physician/Extender: Frann Rider in Treatment: 25 Visit Information History Since Last Visit All ordered tests and consults were completed: No Patient Arrived: Wheel Chair Added or deleted any medications: No Arrival Time: 08:45 Any new allergies or adverse reactions: No Accompanied By: son Had a fall or experienced change in No Transfer Assistance: [employer] activities of daily living that may affect Patient Lift risk of falls: Patient Identification Verified: Yes Signs or symptoms of abuse/neglect since last No Secondary Verification Process Yes visito Completed: Hospitalized since last visit: No Patient Requires Transmission- No Has Dressing in Place as Prescribed: Yes Based Precautions: Has Compression in Place as Prescribed: Yes Patient Has Alerts: No Pain Present Now: No Electronic Signature(s) Signed: 12/13/2016 4:21:45 PM By: Alric Quan Entered By: Alric Quan on 12/13/2016 08:47:48 [name] ([id number]) -------------------------------------------------------------------------------- Encounter Discharge Information Details Patient Name: Brooke Hopkins, Brooke Hopkins. Date of Service: 12/13/2016 8:45 AM Medical Record Number: [id number] Patient Account Number: [id number] Date of Birth/Sex: Aug 09, 1951 (65 y.o. Female) Treating RN: Ahmed Prima Primary Care Physician: FITZGERALD, DAVID Other Clinician: Referring Physician: FITZGERALD, DAVID Treating Physician/Extender: Frann Rider in Treatment: 25 Encounter Discharge Information  Items Discharge Pain Level: 0 Discharge Condition: Stable Ambulatory Status: Wheelchair Discharge Destination: Home Transportation: Private Auto Accompanied By: son Schedule Follow-up Appointment: Yes Medication Reconciliation completed and provided to Patient/Care Yes [name]: Provided on Clinical Summary of Care: 12/13/2016 Form Type Recipient Paper Patient LB Electronic Signature(s) Signed: 12/13/2016 9:43:57 AM By: Ruthine Dose Entered By: Ruthine Dose on 12/13/2016 09:43:57 [name] ([id number]) -------------------------------------------------------------------------------- Lower Extremity Assessment Details Patient Name: Brooke Hopkins, Brooke Hopkins. Date of Service: 12/13/2016 8:45 AM Medical Record Number: [id number] Patient Account Number: [id number] Date of Birth/Sex: 05/09/1951 (65 y.o. Female) Treating RN: Ahmed Prima Primary Care Physician: FITZGERALD, DAVID Other Clinician: Referring Physician: FITZGERALD, DAVID Treating Physician/Extender: Frann Rider in Treatment: 25 Edema Assessment Assessed: [Left: No] [Right: No] E[Left: dema] [Right: :] Calf Left: Right: Point of Measurement: 32 cm From Medial Instep 53.5 cm 48.6 cm Ankle Left: Right: Point of Measurement: 11 cm From Medial Instep 30.2 cm 28 cm Vascular Assessment Pulses: Posterior Tibial Dorsalis Pedis Palpable: [Left:Yes] [Right:Yes] Extremity colors, hair growth, and conditions: Extremity Color: [Left:Red] [Right:Red] Temperature of Extremity: [Left:Warm] [Right:Warm] Capillary Refill: [Left:< 3 seconds] [Right:< 3 seconds] Toe Nail Assessment Left: Right: Thick: Yes Yes Discolored: Yes Yes Deformed: No No Improper Length and Hygiene: Yes Yes Electronic Signature(s) Signed: 12/13/2016 4:21:45 PM By: Alric Quan Entered By: Alric Quan on 12/13/2016 08:56:48 [name]  ([id number]) -------------------------------------------------------------------------------- Multi Wound Chart Details Patient Name: Brooke Hopkins, Brooke Hopkins. Date of Service: 12/13/2016 8:45 AM Medical Record Number: [id number] Patient Account Number: [id number] Date of Birth/Sex: [birth year] (65 y.o. Female) Treating RN: Ahmed Prima Primary Care Physician: FITZGERALD, DAVID Other Clinician: Referring Physician: FITZGERALD, DAVID Treating Physician/Extender: Frann Rider in Treatment: 25 Vital Signs Height(in): 67 Pulse(bpm): 82 Weight(lbs): 300 Blood Pressure 178/75 (mmHg): Body Mass Index(BMI): 47 Temperature(F): 98.1 Respiratory Rate 18 (breaths/min): Photos: [1:No Photos] [2:No Photos] [N/A:N/A] Wound Location: [1:Left, Posterior Lower Leg Right Lower Leg -] [2:Posterior] [N/A:N/A] Wounding Event: [1:Gradually Appeared] [2:Gradually Appeared] [N/A:N/A] Primary Etiology: [1:Lymphedema] [2:Lymphedema] [N/A:N/A] Date Acquired: [1:12/31/2015] [2:12/31/2015] [N/A:N/A] Weeks of Treatment: [1:25] [2:25] [  N/A:N/A] Wound Status: [1:Open] [2:Open] [N/A:N/A] Clustered Wound: [1:No] [2:Yes] [N/A:N/A] Clustered Quantity: [1:N/A] [2:3] [N/A:N/A] Measurements L x W x D 4x7x0.2 [2:1x1.5x0.2] [N/A:N/A] (cm) Area (cm) : [1:21.991] [2:1.178] [N/A:N/A] Volume (cm) : [1:4.398] [2:0.236] [N/A:N/A] % Reduction in Area: [1:78.10%] [2:99.50%] [N/A:N/A] % Reduction in Volume: 78.10% [2:99.50%] [N/A:N/A] Classification: [1:Partial Thickness] [2:Partial Thickness] [N/A:N/A] Exudate Amount: [1:Large] [2:Large] [N/A:N/A] Exudate Type: [1:Serous] [2:Serous] [N/A:N/A] Exudate Color: [1:amber] [2:amber] [N/A:N/A] Foul Odor After [1:Yes] [2:Yes] [N/A:N/A] Cleansing: Odor Anticipated Due to No [2:No] [N/A:N/A] Product Use: Wound Margin: [1:Flat and Intact] [2:Indistinct, nonvisible] [N/A:N/A] Granulation Amount: [1:None Present (0%)] [2:None Present (0%)] [N/A:N/A] Necrotic Amount: [1:Large  (67-100%)] [2:Large (67-100%)] [N/A:N/A] Exposed Structures: [1:Fascia: No Fat: No Tendon: No] [2:Fascia: No Fat: No Tendon: No] [N/A:N/A] Muscle: No Muscle: No Joint: No Joint: No Bone: No Bone: No Limited to Skin Limited to Skin Breakdown Breakdown Epithelialization: Medium (34-66%) Small (1-33%) N/A Periwound Skin Texture: Edema: Yes Edema: Yes N/A Excoriation: Yes Excoriation: Yes Induration: No Induration: No Callus: No Callus: No Crepitus: No Crepitus: No Fluctuance: No Fluctuance: No Friable: No Friable: No Rash: No Rash: No Scarring: No Scarring: No Periwound Skin Maceration: Yes Maceration: Yes N/A Moisture: Moist: Yes Moist: Yes Dry/Scaly: No Dry/Scaly: No Periwound Skin Color: Rubor: Yes Erythema: Yes N/A Atrophie Blanche: No Hemosiderin Staining: Yes Cyanosis: No Rubor: Yes Ecchymosis: No Atrophie Blanche: No Erythema: No Cyanosis: No Hemosiderin Staining: No Ecchymosis: No Mottled: No Mottled: No Pallor: No Pallor: No Erythema Location: N/A Circumferential N/A Temperature: No Abnormality No Abnormality N/A Tenderness on Yes Yes N/A Palpation: Wound Preparation: Ulcer Cleansing: Other: Ulcer Cleansing: Other: N/A soap and water soap and water Topical Anesthetic Topical Anesthetic Applied: Other: lidocaine Applied: Other: lidocaine 4% 4% Treatment Notes Electronic Signature(s) Signed: 12/13/2016 4:21:45 PM By: Alric Quan Entered By: Alric Quan on 12/13/2016 09:06:00 [name] ([id number]) -------------------------------------------------------------------------------- Multi-Disciplinary Care Plan Details Patient Name: Brooke Hopkins, Brooke Hopkins. Date of Service: 12/13/2016 8:45 AM Medical Record Number: [id number] Patient Account Number: [id number] Date of Birth/Sex: 1951/09/05 (65 y.o. Female) Treating RN: Ahmed Prima Primary Care Physician: FITZGERALD, DAVID Other Clinician: Referring Physician: FITZGERALD,  DAVID Treating Physician/Extender: Frann Rider in Treatment: 25 Active Inactive Orientation to the Wound Care Program Nursing Diagnoses: Knowledge deficit related to the wound healing center program Goals: Patient/caregiver will verbalize understanding of the [place] Program Date Initiated: 06/21/2016 Goal Status: Active Interventions: Provide education on orientation to the wound center Notes: Wound/Skin Impairment Nursing Diagnoses: Impaired tissue integrity Goals: Patient/caregiver will verbalize understanding of skin care regimen Date Initiated: 06/21/2016 Goal Status: Active Ulcer/skin breakdown will have a volume reduction of 30% by week 4 Date Initiated: 06/21/2016 Goal Status: Active Ulcer/skin breakdown will have a volume reduction of 50% by week 8 Date Initiated: 06/21/2016 Goal Status: Active Ulcer/skin breakdown will have a volume reduction of 80% by week 12 Date Initiated: 06/21/2016 Goal Status: Active Ulcer/skin breakdown will heal within 14 weeks Date Initiated: 06/21/2016 Goal Status: Active DALONDA, SIMONI ([id number]) Interventions: Assess patient/caregiver ability to obtain necessary supplies Assess patient/caregiver ability to perform ulcer/skin care regimen upon admission and as needed Assess ulceration(s) every visit Provide education on ulcer and skin care Notes: Electronic Signature(s) Signed: 12/13/2016 4:21:45 PM By: Alric Quan Entered By: Alric Quan on 12/13/2016 09:04:09 [name] ([id number]) -------------------------------------------------------------------------------- Pain Assessment Details Patient Name: Brooke Hopkins, Brooke Hopkins. Date of Service: 12/13/2016 8:45 AM Medical Record Number: [id number] Patient Account Number: [id number] Date of Birth/Sex: [birth year] (65 y.o. Female) Treating RN: Ahmed Prima Primary Care  Physician: FITZGERALD, DAVID Other Clinician: Referring Physician: FITZGERALD,  DAVID Treating Physician/Extender: Frann Rider in Treatment: 25 Active Problems Location of Pain Severity and Description of Pain Patient Has Paino No Site Locations With Dressing Change: No Pain Management and Medication Current Pain Management: Electronic Signature(s) Signed: 12/13/2016 4:21:45 PM By: Alric Quan Entered By: Alric Quan on 12/13/2016 08:47:53 [name] ([id number]) -------------------------------------------------------------------------------- Patient/Caregiver Education Details Patient Name: Brooke Hopkins, Brooke Hopkins. Date of Service: 12/13/2016 8:45 AM Medical Record Number: [id number] Patient Account Number: [id number] Date of Birth/Gender: [birth year] (65 y.o. Female) Treating RN: Ahmed Prima Primary Care Physician: FITZGERALD, DAVID Other Clinician: Referring Physician: FITZGERALD, DAVID Treating Physician/Extender: Frann Rider in Treatment: 25 Education Assessment Education Provided To: Patient Education Topics Provided Wound/Skin Impairment: Handouts: Other: change dressing as ordered and do not get dressings wet Methods: Demonstration, Explain/Verbal Responses: State content correctly Electronic Signature(s) Signed: 12/13/2016 4:21:45 PM By: Alric Quan Entered By: Alric Quan on 12/13/2016 09:09:24 [name] ([id number]) -------------------------------------------------------------------------------- Wound Assessment Details Patient Name: Brooke Hopkins, Brooke Hopkins. Date of Service: 12/13/2016 8:45 AM Medical Record Number: [id number] Patient Account Number: [id number] Date of Birth/Sex: 05-Aug-1951 (65 y.o. Female) Treating RN: Ahmed Prima Primary Care Physician: FITZGERALD, DAVID Other Clinician: Referring Physician: FITZGERALD, DAVID Treating Physician/Extender: Frann Rider in Treatment: 25 Wound Status Wound Number: 1 Primary Etiology: Lymphedema Wound Location: Left, Posterior  Lower Leg Wound Status: Open Wounding Event: Gradually Appeared Date Acquired: 12/31/2015 Weeks Of Treatment: 25 Clustered Wound: No Photos Photo Uploaded By: Alric Quan on 12/13/2016 09:08:41 Wound Measurements Length: (cm) 4 Width: (cm) 7 Depth: (cm) 0.2 Area: (cm) 21.991 Volume: (cm) 4.398 % Reduction in Area: 78.1% % Reduction in Volume: 78.1% Epithelialization: Medium (34-66%) Tunneling: No Undermining: No Wound Description Classification: Partial Thickness Wound Margin: Flat and Intact Exudate Amount: Large Exudate Type: Serous Exudate Color: amber Foul Odor After Cleansing: Yes Due to Product Use: No Wound Bed Granulation Amount: None Present (0%) Exposed Structure Necrotic Amount: Large (67-100%) Fascia Exposed: No Necrotic Quality: Adherent Slough Fat Layer Exposed: No Tendon Exposed: No Brooke Hopkins, Brooke Hopkins ([id number]) Muscle Exposed: No Joint Exposed: No Bone Exposed: No Limited to Skin Breakdown Periwound Skin Texture Texture Color No Abnormalities Noted: No No Abnormalities Noted: No Callus: No Atrophie Blanche: No Crepitus: No Cyanosis: No Excoriation: Yes Ecchymosis: No Fluctuance: No Erythema: No Friable: No Hemosiderin Staining: No Induration: No Mottled: No Localized Edema: Yes Pallor: No Rash: No Rubor: Yes Scarring: No Temperature / Pain Moisture Temperature: No Abnormality No Abnormalities Noted: No Tenderness on Palpation: Yes Dry / Scaly: No Maceration: Yes Moist: Yes Wound Preparation Ulcer Cleansing: Other: soap and water, Topical Anesthetic Applied: Other: lidocaine 4%, Treatment Notes Wound #1 (Left, Posterior Lower Leg) 1. Cleansed with: Clean wound with Normal Saline Cleanse wound with antibacterial soap and water 2. Anesthetic Topical Lidocaine 4% cream to wound bed prior to debridement 4. Dressing Applied: Santyl Ointment 5. Secondary Dressing Applied ABD Pad Dry Gauze 7. Secured with Tape 3  Layer Compression System - Bilateral Notes unna to anchor Electronic Signature(s) Signed: 12/13/2016 4:21:45 PM By: Tedra Coupe ([id number]) Entered By: Alric Quan on 12/13/2016 09:04:03 [name] ([id number]) -------------------------------------------------------------------------------- Wound Assessment Details Patient Name: Brooke Hopkins, MCMENAMY. Date of Service: 12/13/2016 8:45 AM Medical Record Number: [id number] Patient Account Number: [id number] Date of Birth/Sex: 03-Aug-1951 (65 y.o. Female) Treating RN: Ahmed Prima Primary Care Physician: FITZGERALD, DAVID Other Clinician: Referring Physician: FITZGERALD, DAVID Treating Physician/Extender: Frann Rider in Treatment: 25 Wound Status Wound Number:  2 Primary Etiology: Lymphedema Wound Location: Right Lower Leg - Posterior Wound Status: Open Wounding Event: Gradually Appeared Date Acquired: 12/31/2015 Weeks Of Treatment: 25 Clustered Wound: Yes Photos Photo Uploaded By: Alric Quan on 12/13/2016 09:08:42 Wound Measurements Length: (cm) 1 Width: (cm) 1.5 Depth: (cm) 0.2 Clustered Quantity: 3 Area: (cm) 1.178 Volume: (cm) 0.236 % Reduction in Area: 99.5% % Reduction in Volume: 99.5% Epithelialization: Small (1-33%) Wound Description Classification: Partial Thickness Wound Margin: Indistinct, nonvisible Exudate Amount: Large Exudate Type: Serous Exudate Color: amber Foul Odor After Cleansing: Yes Due to Product Use: No Wound Bed Granulation Amount: None Present (0%) Exposed Structure Necrotic Amount: Large (67-100%) Fascia Exposed: No Necrotic Quality: Adherent Slough Fat Layer Exposed: No KENYATA, NAPIER ([id number]) Tendon Exposed: No Muscle Exposed: No Joint Exposed: No Bone Exposed: No Limited to Skin Breakdown Periwound Skin Texture Texture Color No Abnormalities Noted: No No Abnormalities Noted: No Callus: No Atrophie Blanche:  No Crepitus: No Cyanosis: No Excoriation: Yes Ecchymosis: No Fluctuance: No Erythema: Yes Friable: No Erythema Location: Circumferential Induration: No Hemosiderin Staining: Yes Localized Edema: Yes Mottled: No Rash: No Pallor: No Scarring: No Rubor: Yes Moisture Temperature / Pain No Abnormalities Noted: No Temperature: No Abnormality Dry / Scaly: No Tenderness on Palpation: Yes Maceration: Yes Moist: Yes Wound Preparation Ulcer Cleansing: Other: soap and water, Topical Anesthetic Applied: Other: lidocaine 4%, Treatment Notes Wound #2 (Right, Posterior Lower Leg) 1. Cleansed with: Clean wound with Normal Saline Cleanse wound with antibacterial soap and water 2. Anesthetic Topical Lidocaine 4% cream to wound bed prior to debridement 4. Dressing Applied: Santyl Ointment 5. Secondary Dressing Applied ABD Pad Dry Gauze 7. Secured with Tape 3 Layer Compression System - Bilateral Notes unna to anchor HAYLEE, MCANANY ([id number]) Electronic Signature(s) Signed: 12/13/2016 4:21:45 PM By: Alric Quan Entered By: Alric Quan on 12/13/2016 09:00:14 [name] ([id number]) -------------------------------------------------------------------------------- [place] Details Patient Name: RUSTY, GLODOWSKI. Date of Service: 12/13/2016 8:45 AM Medical Record Number: [id number] Patient Account Number: [id number] Date of Birth/Sex: [birth year] (65 y.o. Female) Treating RN: Ahmed Prima Primary Care Physician: FITZGERALD, DAVID Other Clinician: Referring Physician: FITZGERALD, DAVID Treating Physician/Extender: Frann Rider in Treatment: 25 Vital Signs Time Taken: 08:48 Temperature (F): 98.1 Height (in): 67 Pulse (bpm): 82 Weight (lbs): 300 Respiratory Rate (breaths/min): 18 Body Mass Index (BMI): 47 Blood Pressure (mmHg): 178/75 Reference Range: 80 - 120 mg / dl Notes Pt states that she has not taken her BP medication this  morning. Electronic Signature(s) Signed: 12/13/2016 4:21:45 PM By: Alric Quan Entered By: Alric Quan on 12/13/2016 08:49:40

## 2016-12-27 ENCOUNTER — Encounter: Payer: 59 | Admitting: Surgery

## 2016-12-27 DIAGNOSIS — E11622 Type 2 diabetes mellitus with other skin ulcer: Secondary | ICD-10-CM | POA: Diagnosis not present

## 2016-12-28 NOTE — Progress Notes (Signed)
ALYIAH, ULLOA ([id number]) Visit Report for 12/27/2016 Chief Complaint Document Details Patient Name: Brooke Hopkins, Brooke Hopkins 12/27/2016 8:45 Date of Service: AM Medical Record [id number] Number: Patient Account Number: [id number] 02-Mar-1951 (65 y.o. Treating RN: Ahmed Prima Date of Birth/Sex: Female) Other Clinician: Primary Care Physician: FITZGERALD, DAVID Treating Christin Fudge Referring Physician: FITZGERALD, DAVID Physician/Extender: Suella Grove in Treatment: 27 Information Obtained from: Patient Chief Complaint Mrs. Milian returns for follow-up to her bilateral lower extremity ulcers Electronic Signature(s) Signed: 12/27/2016 9:00:57 AM By: Christin Fudge MD, FACS Entered By: Christin Fudge on 12/27/2016 09:00:57 NYILAH, KIGHT ([id number]) -------------------------------------------------------------------------------- HPI Details Patient Name: Brooke Hopkins, Brooke Hopkins 12/27/2016 8:45 Date of Service: AM Medical Record [id number] Number: Patient Account Number: [id number] 1951-10-17 (65 y.o. Treating RN: Ahmed Prima Date of Birth/Sex: Female) Other Clinician: Primary Care Physician: FITZGERALD, DAVID Treating Christin Fudge Referring Physician: FITZGERALD, DAVID Physician/Extender: Weeks in Treatment: 27 History of Present Illness Location: massive swelling of both lower extremities and ulceration both lower extremities Quality: Patient reports experiencing a dull pain to affected area(s). Severity: Patient states wound are getting worse. Duration: Patient has had the wound for >2 years prior to seeking treatment at the wound center Timing: Pain in wound is constant (hurts all the time) Context: The wound appeared gradually over time Modifying Factors: Other treatment(s) tried include:she has a lymphedema pump but uses it seldom and she's had several course of antibiotics Associated Signs and Symptoms: Patient reports having difficulty standing for long  periods. HPI Description: 65 year old patient seen by Dr. Ola Spurr of infectious disease who has been following up for left lower extremity cellulitis and ulcer with recurrent bilateral lower extremity problems for several months. Recently she had a large right lower extremity bullae which opened out and has been ulcerated. She has seen the vascular group and has been getting Unna's wraps and has a lymphedema pump used in the past. Her prior cultures were positive for Pseudomonas, Proteus and was treated with amoxicillin. He has also been treated with 2 weeks course of ciprofloxacin and amoxicillin.. Increase of Lasix dose helped with the edema and echo showed no systolic CHF but may be diastolic problems. Past medical history significant for diabetes mellitus type 2, venous stasis ulcer, obesity, diabetic peripheral neuropathy, status post back surgery, cholecystectomy, hysterectomy, arthroscopy of the knee. He is a former smoker and quit smoking in 1984. The patient has seen Dr. Delana Meyer who did not recommend any arterial or venous duplex studies and has been using Unna's wraps and also recommended a lymphedema pump. she has been very noncompliant with using these. 06/28/2016 -- the patient is still on antibiotics as prescribed by Dr. Ola Spurr and he is asked her to take it for 3 weeks. The patient also says she has a lot of redness and pain in the folds of her thigh and lower extremity and this is very painful. 07/26/2016 -- the patient is off antibiotics and has been getting dressing changes 3 times a week. 08/09/2016 -- is been on Cipro and is taking potassium supplements along with her Lasix and is going to be seeing Dr. Ola Spurr for a consult return visit only in November 2017. 08/23/2016 -- she was admitted to the hospital last week and I have reviewed these reports in detail. She was seen by Dr. Ola Spurr who noted a Pseudomonas infection which was resistant to ciprofloxacin  and discharged her on Ceftazidime 1 g IV every 12 hourly anyone days. The antibiotic was to be stopped on September 11 and he would see her  back in the clinic. KENLEA, WOODELL ([id number]) 08/30/2016 -- had a communication from Dr. Ola Spurr that he would extend her antibiotics by a week if she continued to look like cellulitis was persisting. 09/13/2016 -- the PICC line is out and antibiotics have stopped. 10/04/16: returns today for f/u. reports utilizes compression pump twice daily. she is compliant with her compression therapy. she reports a new blister on the right proximal posterior calf. no systemic s/s of infection. 10/11/16: returns today for f/u. she reports adherence to her compression pumps, but there is no improvement regarding her BLE edema. there is weeping of fluid. denies fever, chills, body aches or malaise. 10/18/2016 -- she has a significant cellulitis of her right lower extremity which was not there last week. I believe at this stage she will need the expertise of Dr. Ola Spurr to decide on antibiotic coverage. 10/25/2016 -- the patient has had cipro and ampicillin been started by Dr. Ola Spurr and he is awaiting further cultures. Overall she's been feeling better. 11/08/2016 -- Dr. Ola Spurr this week who has continued with ciprofloxacin and amoxicillin and is awaiting further cultures before deciding to place her on a PICC line. 11/15/2016 -- Dr. Blane Ohara note was reviewed and her cultures growing Pseudomonas in both legs which is sensitive to Cipro and the other is resistant to Cipro and she would get a PICC line and start IV antibiotics -- Ceftazidime 1 g q 12 for 3 weeks. 11-29-16 Mrs. Wirick presents today in follow-up of her bilateral lower exterminators ulcers. She remains on IV antibiotic therapy via a PICC line per Dr. Ola Spurr; she has a follow-up appointment with him on Monday, 12/02/2016. She anticipates that the about therapy will be  discontinued at that appointment. She denies any complications of nausea vomiting and/or diarrhea accompanied by this antibiotic therapy. She admits to using her lymphedema pumps twice daily with the exception of yesterday. She denies any concerns or complications with the current treatment plan. 12/06/2016 -- Dr. Ola Spurr saw her recently on 12/02/2016 and due to the fact she has had significant problems with recurrent ulceration and Pseudomonas infection he recommended 3 more weeks of antibiotics and extended it until December 25. The PICC line will be in place. Last hemoglobin A1c on November 30 was 9% 12/27/2016 -- because of the holidays the patient's diet has been higher in salt, her dressings have not been done as required and she is not used to lymphedema pumps appropriately. This has led to her lymphedema increasing markedly. Electronic Signature(s) Signed: 12/27/2016 9:01:31 AM By: Christin Fudge MD, FACS Entered By: Christin Fudge on 12/27/2016 09:01:31 Brooke Hopkins, Brooke Hopkins ([id number]) -------------------------------------------------------------------------------- Physical Exam Details Patient Name: Brooke Hopkins, Brooke Hopkins 12/27/2016 8:45 Date of Service: AM Medical Record [id number] Number: Patient Account Number: [id number] [birth year] (65 y.o. Treating RN: Ahmed Prima Date of Birth/Sex: Female) Other Clinician: Primary Care Physician: FITZGERALD, DAVID Treating Christin Fudge Referring Physician: FITZGERALD, DAVID Physician/Extender: Weeks in Treatment: 27 Constitutional . Pulse regular. Respirations normal and unlabored. Afebrile. . Eyes Nonicteric. Reactive to light. Ears, Nose, Mouth, and Throat Lips, teeth, and gums WNL.Marland Kitchen Moist mucosa without lesions. Neck supple and nontender. No palpable supraclavicular or cervical adenopathy. Normal sized without goiter. Respiratory WNL. No retractions.. Breath sounds WNL, No rubs, rales, rhonchi, or  wheeze.. Cardiovascular Heart rhythm and rate regular, no murmur or gallop.. Pedal Pulses WNL. No clubbing, cyanosis or edema. Lymphatic No adneopathy. No adenopathy. No adenopathy. Musculoskeletal Adexa without tenderness or enlargement.. Digits and nails w/o clubbing, cyanosis, infection, petechiae, ischemia, or  inflammatory conditions.. Integumentary (Hair, Skin) No suspicious lesions. No crepitus or fluctuance. No peri-wound warmth or erythema. No masses.Marland Kitchen Psychiatric Judgement and insight Intact.. No evidence of depression, anxiety, or agitation.. Notes her lymphedema has gone up significantly and the ulcerations on the posterior part of the calf did not have much subcutaneous debris and no sharp debridement was required today. Electronic Signature(s) Signed: 12/27/2016 9:02:00 AM By: Christin Fudge MD, FACS Entered By: Christin Fudge on 12/27/2016 09:01:59 [name] ([id number]) -------------------------------------------------------------------------------- Physician Orders Details Patient Name: MARVINA, DANNER 12/27/2016 8:45 Date of Service: AM Medical Record [id number] Number: Patient Account Number: [id number] [birth year] (65 y.o. Treating RN: Baruch Gouty, RN, BSN, Velva Harman Date of Birth/Sex: Female) Other Clinician: Primary Care Physician: FITZGERALD, DAVID Treating Christin Fudge Referring Physician: FITZGERALD, DAVID Physician/Extender: Weeks in Treatment: 75 Verbal / Phone Orders: Yes Clinician: Afful, RN, BSN, Rita Read Back and Verified: Yes Diagnosis Coding Wound Cleansing Wound #1 Left,Posterior Lower Leg o Clean wound with Normal Saline. - clinic use o Cleanse wound with mild soap and water - HHRN please scrub wounds with mild soap and water o May Shower, gently pat wound dry prior to applying new dressing. o May shower with protection. Wound #2 Right,Posterior Lower Leg o Clean wound with Normal Saline. - clinic use o Cleanse wound with  mild soap and water - HHRN please scrub wounds with mild soap and water o May Shower, gently pat wound dry prior to applying new dressing. o May shower with protection. Anesthetic Wound #1 Left,Posterior Lower Leg o Topical Lidocaine 4% cream applied to wound bed prior to debridement - clinic use Wound #2 Right,Posterior Lower Leg o Topical Lidocaine 4% cream applied to wound bed prior to debridement - clinic use Skin Barriers/Peri-Wound Care Wound #1 Left,Posterior Lower Leg o Other: - Lotrisone cream in folds at knees and on reddened areas on legs (Nystatin used in clinic) Wound #2 Right,Posterior Lower Leg o Other: - Lotrisone cream in folds at knees and on reddened areas on legs (Nystatin used in clinic) Primary Wound Dressing Wound #1 Left,Posterior Lower Leg o Santyl Ointment - use on the posterior leg wounds Wound #2 Right,Posterior Lower Leg o Santyl Ointment - use on the posterior leg wounds DAISIE, HAFT ([id number]) Secondary Dressing Wound #1 Left,Posterior Lower Leg o ABD pad o Dry Gauze Wound #2 Right,Posterior Lower Leg o ABD pad o Dry Gauze Dressing Change Frequency Wound #1 Left,Posterior Lower Leg o Change Dressing Monday, Wednesday, Friday - HHRN to change wraps Monday and Wednesday Wound #2 Right,Posterior Lower Leg o Change Dressing Monday, Wednesday, Friday - HHRN to change wraps Monday and Wednesday o Change Dressing Monday, Wednesday, Friday - HHRN to change wraps Monday and Wednesday Follow-up Appointments Wound #1 Left,Posterior Lower Leg o Return Appointment in 2 weeks. Wound #2 Right,Posterior Lower Leg o Return Appointment in 2 weeks. Edema Control Wound #1 Left,Posterior Lower Leg o 3 Layer Compression System - Bilateral - May anchor top of wrap with dome paste, but only wrap around leg one time. o Elevate legs to the level of the heart and pump ankles as often as possible o Compression Pump: Use  compression pump on left lower extremity for 30 minutes, twice daily. o Compression Pump: Use compression pump on right lower extremity for 30 minutes, twice daily. Wound #2 Right,Posterior Lower Leg o 3 Layer Compression System - Bilateral - May anchor top of wrap with dome paste, but only wrap around leg one time. o Elevate legs to the level of  the heart and pump ankles as often as possible o Compression Pump: Use compression pump on left lower extremity for 30 minutes, twice daily. o Compression Pump: Use compression pump on right lower extremity for 30 minutes, twice daily. Additional Orders / Instructions o Increase protein intake. Home Health Wound #1 Left,Posterior Lower Leg Brooke Hopkins, Brooke Hopkins ([id number]) o [place] Visits - [facility]---*****HHRN Please see pt next Friday 12/20/16 and we will see her the following Friday after Christmas***** o Home Health Nurse may visit PRN to address patientos wound care needs. o FACE TO FACE ENCOUNTER: MEDICARE and MEDICAID PATIENTS: I certify that this patient is under my care and that I had a face-to-face encounter that meets the physician face-to-face encounter requirements with this patient on this date. The encounter with the patient was in whole or in part for the following MEDICAL CONDITION: (primary reason for [place]) MEDICAL NECESSITY: I certify, that based on my findings, NURSING services are a medically necessary home health service. HOME BOUND STATUS: I certify that my clinical findings support that this patient is homebound (i.e., Due to illness or injury, pt requires aid of supportive devices such as crutches, cane, wheelchairs, walkers, the use of special transportation or the assistance of another person to leave their place of residence. There is a normal inability to leave the home and doing so requires considerable and taxing effort. Other absences are for medical reasons / religious  services and are infrequent or of short duration when for other reasons). o Please direct any NON-WOUND related issues/requests for orders to patient's Primary Care Physician Wound #2 [place] Visits - [facility]---*****HHRN Please see pt next Friday 12/20/16 and we will see her the following Friday after Christmas***** o Home Health Nurse may visit PRN to address patientos wound care needs. o FACE TO FACE ENCOUNTER: MEDICARE and MEDICAID PATIENTS: I certify that this patient is under my care and that I had a face-to-face encounter that meets the physician face-to-face encounter requirements with this patient on this date. The encounter with the patient was in whole or in part for the following MEDICAL CONDITION: (primary reason for [place]) MEDICAL NECESSITY: I certify, that based on my findings, NURSING services are a medically necessary home health service. HOME BOUND STATUS: I certify that my clinical findings support that this patient is homebound (i.e., Due to illness or injury, pt requires aid of supportive devices such as crutches, cane, wheelchairs, walkers, the use of special transportation or the assistance of another person to leave their place of residence. There is a normal inability to leave the home and doing so requires considerable and taxing effort. Other absences are for medical reasons / religious services and are infrequent or of short duration when for other reasons). o Please direct any NON-WOUND related issues/requests for orders to patient's Primary Care Physician Electronic Signature(s) Signed: 12/27/2016 4:27:48 PM By: Christin Fudge MD, FACS Signed: 12/27/2016 5:28:47 PM By: Regan Lemming BSN, RN Entered By: Regan Lemming on 12/27/2016 09:12:37 Brooke Hopkins, Brooke Hopkins ([id number]) -------------------------------------------------------------------------------- Problem List Details Patient Name: Brooke Hopkins, Brooke Hopkins  12/27/2016 8:45 Date of Service: AM Medical Record [id number] Number: Patient Account Number: [id number] [birth year] (65 y.o. 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 Treating RN: Ahmed Prima Date of Birth/Sex: Female) Other Clinician: Primary Care Physician: FITZGERALD, DAVID Treating Christin Fudge Referring Physician: FITZGERALD, DAVID Physician/Extender: Suella [place] in Treatment: 27 Subjective Chief Complaint Information obtained from Patient Mrs. Riggenbach returns for follow-up to her bilateral lower extremity ulcers History of Present Illness (HPI) The following HPI elements were documented for the patient's wound: Location: massive swelling of both lower extremities and ulceration both lower extremities Quality: Patient reports experiencing a dull pain to affected area(s). Severity: Patient states  wound are getting worse. Duration: Patient has had the wound for >2 years prior to seeking treatment at the wound center Timing: Pain in wound is constant (hurts all the time) Context: The wound appeared gradually over time Modifying Factors: Other treatment(s) tried include:she has a lymphedema pump but uses it seldom and she's had several course of antibiotics Associated Signs and Symptoms: Patient reports having difficulty standing for long periods. 65 year old patient seen by Dr. Ola Spurr of infectious disease who has been following up for left lower extremity cellulitis and ulcer with recurrent bilateral lower extremity problems for several months. Recently she had a large right lower extremity bullae which opened out and has been ulcerated. She has seen the vascular group and has been getting Unna's wraps and has a lymphedema pump used in the past. Her prior cultures were positive for Pseudomonas, Proteus and was treated with amoxicillin. He has also been treated with 2 weeks course of ciprofloxacin and amoxicillin.. Increase of Lasix dose helped with the edema and echo showed no systolic CHF but may be diastolic problems. Past medical history significant for diabetes mellitus type 2, venous stasis ulcer, obesity, diabetic peripheral neuropathy, status post back surgery, cholecystectomy, hysterectomy, arthroscopy of the knee. He is a former smoker and quit smoking in 1984. The patient has seen Dr. Delana Meyer who did not recommend any arterial or venous duplex studies and has been using Unna's wraps and also recommended a lymphedema pump. she has been very noncompliant with using these. 06/28/2016 -- the patient is still on antibiotics as prescribed by Dr. Ola Spurr and he is asked her to take it for 3 weeks. The patient also says she has a lot of redness and pain in the folds of her thigh and lower extremity and this is very painful. 07/26/2016 -- the patient is off antibiotics and has  been getting dressing changes 3 times a week. RAYSA, BOSAK ([id number]) 08/09/2016 -- is been on Cipro and is taking potassium supplements along with her Lasix and is going to be seeing Dr. Ola Spurr for a consult return visit only in November 2017. 08/23/2016 -- she was admitted to the hospital last week and I have reviewed these reports in detail. She was seen by Dr. Ola Spurr who noted a Pseudomonas infection which was resistant to ciprofloxacin and discharged her on Ceftazidime 1 g IV every 12 hourly anyone days. The antibiotic was to be stopped on September 11 and he would see her back in  the clinic. 08/30/2016 -- had a communication from Dr. Ola Spurr that he would extend her antibiotics by a week if she continued to look like cellulitis was persisting. 09/13/2016 -- the PICC line is out and antibiotics have stopped. 10/04/16: returns today for f/u. reports utilizes compression pump twice daily. she is compliant with her compression therapy. she reports a new blister on the right proximal posterior calf. no systemic s/s of infection. 10/11/16: returns today for f/u. she reports adherence to her compression pumps, but there is no improvement regarding her BLE edema. there is weeping of fluid. denies fever, chills, body aches or malaise. 10/18/2016 -- she has a significant cellulitis of her right lower extremity which was not there last week. I believe at this stage she will need the expertise of Dr. Ola Spurr to decide on antibiotic coverage. 10/25/2016 -- the patient has had cipro and ampicillin been started by Dr. Ola Spurr and he is awaiting further cultures. Overall she's been feeling better. 11/08/2016 -- Dr. Ola Spurr this week who has continued with ciprofloxacin and amoxicillin and is awaiting further cultures before deciding to place her on a PICC line. 11/15/2016 -- Dr. Blane Ohara note was reviewed and her cultures growing Pseudomonas in both legs which is sensitive  to Cipro and the other is resistant to Cipro and she would get a PICC line and start IV antibiotics -- Ceftazidime 1 g q 12 for 3 weeks. 11-29-16 Mrs. Bugaj presents today in follow-up of her bilateral lower exterminators ulcers. She remains on IV antibiotic therapy via a PICC line per Dr. Ola Spurr; she has a follow-up appointment with him on Monday, 12/02/2016. She anticipates that the about therapy will be discontinued at that appointment. She denies any complications of nausea vomiting and/or diarrhea accompanied by this antibiotic therapy. She admits to using her lymphedema pumps twice daily with the exception of yesterday. She denies any concerns or complications with the current treatment plan. 12/06/2016 -- Dr. Ola Spurr saw her recently on 12/02/2016 and due to the fact she has had significant problems with recurrent ulceration and Pseudomonas infection he recommended 3 more weeks of antibiotics and extended it until December 25. The PICC line will be in place. Last hemoglobin A1c on November 30 was 9% 12/27/2016 -- because of the holidays the patient's diet has been higher in salt, her dressings have not been done as required and she is not used to lymphedema pumps appropriately. This has led to her lymphedema increasing markedly. Objective Brooke Hopkins, Brooke Hopkins ([id number]) Constitutional Pulse regular. Respirations normal and unlabored. Afebrile. Vitals Time Taken: 8:37 AM, Height: 67 in, Weight: 300 lbs, BMI: 47, Temperature: 98.3 F, Pulse: 81 bpm, Respiratory Rate: 18 breaths/min, Blood Pressure: 201/69 mmHg. Eyes Nonicteric. Reactive to light. Ears, Nose, Mouth, and Throat Lips, teeth, and gums WNL.Marland Kitchen Moist mucosa without lesions. Neck supple and nontender. No palpable supraclavicular or cervical adenopathy. Normal sized without goiter. Respiratory WNL. No retractions.. Breath sounds WNL, No rubs, rales, rhonchi, or wheeze.. Cardiovascular Heart rhythm and rate  regular, no murmur or gallop.. Pedal Pulses WNL. No clubbing, cyanosis or edema. Lymphatic No adneopathy. No adenopathy. No adenopathy. Musculoskeletal Adexa without tenderness or enlargement.. Digits and nails w/o clubbing, cyanosis, infection, petechiae, ischemia, or inflammatory conditions.Marland Kitchen Psychiatric Judgement and insight Intact.. No evidence of depression, anxiety, or agitation.. General Notes: her lymphedema has gone up significantly and the ulcerations on the posterior part of the calf did not have much subcutaneous debris and no sharp debridement was required today. Integumentary (Hair, Skin) No suspicious lesions.  No crepitus or fluctuance. No peri-wound warmth or erythema. No masses.. Wound #1 status is Open. Original cause of wound was Gradually Appeared. The wound is located on the Left,Posterior Lower Leg. The wound measures 1cm length x 4cm width x 0.2cm depth; 3.142cm^2 area and 0.628cm^3 volume. The wound is limited to skin breakdown. There is no tunneling or undermining noted. There is a large amount of serous drainage noted. The wound margin is flat and intact. There is no granulation within the wound bed. There is a large (67-100%) amount of necrotic tissue within the wound bed including Adherent Slough. The periwound skin appearance exhibited: Excoriation, Localized Edema, Maceration, Moist, Rubor. The periwound skin appearance did not exhibit: Callus, Crepitus, Fluctuance, Friable, Induration, Rash, Scarring, Dry/Scaly, Atrophie Blanche, Cyanosis, Ecchymosis, Hemosiderin Staining, Mottled, Pallor, Erythema. Periwound temperature was noted as No Abnormality. The periwound has tenderness on palpation. TYLISHA, DANIS. ([id number]) Wound #2 status is Open. Original cause of wound was Gradually Appeared. The wound is located on the Right,Posterior Lower Leg. The wound measures 1cm length x 1.3cm width x 0.2cm depth; 1.021cm^2 area and 0.204cm^3 volume. The wound is  limited to skin breakdown. There is no tunneling or undermining noted. There is a large amount of serous drainage noted. The wound margin is indistinct and nonvisible. There is no granulation within the wound bed. There is a large (67-100%) amount of necrotic tissue within the wound bed including Adherent Slough. The periwound skin appearance exhibited: Excoriation, Localized Edema, Maceration, Moist, Hemosiderin Staining, Rubor, Erythema. The periwound skin appearance did not exhibit: Callus, Crepitus, Fluctuance, Friable, Induration, Rash, Scarring, Dry/Scaly, Atrophie Blanche, Cyanosis, Ecchymosis, Mottled, Pallor. The surrounding wound skin color is noted with erythema which is circumferential. Periwound temperature was noted as No Abnormality. The periwound has tenderness on palpation. 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 - clinic use Cleanse wound with mild soap and water - HHRN please scrub wounds with mild soap and water May Shower, gently pat wound dry prior to applying new dressing. May shower with protection. Wound #2 Right,Posterior Lower Leg: Clean wound with Normal Saline. - clinic use Cleanse wound with mild soap and water - HHRN please scrub wounds with mild soap and water May Shower, gently pat wound dry prior to applying new dressing. May shower with protection. Anesthetic: Wound #1 Left,Posterior Lower Leg: Topical Lidocaine 4% cream applied to wound bed prior to debridement - clinic use Wound #2 Right,Posterior Lower Leg: Topical Lidocaine 4% cream applied to wound bed prior to  debridement - clinic use Skin Barriers/Peri-Wound Care: EMANII, BUGBEE ([id number]) Wound #1 Left,Posterior Lower Leg: Other: - Lotrisone cream in folds at knees and on reddened areas on legs (Nystatin used in clinic) Wound #2 Right,Posterior Lower Leg: Other: - Lotrisone cream in folds at knees and on reddened areas on legs (Nystatin used in clinic) Primary Wound Dressing: Wound #1 Left,Posterior Lower Leg: Santyl Ointment - use on the posterior leg wounds Wound #2 Right,Posterior Lower Leg: Santyl Ointment - use on the posterior leg wounds Secondary Dressing: Wound #1 Left,Posterior Lower Leg: ABD pad Dry Gauze Wound #2 Right,Posterior Lower Leg: ABD pad Dry Gauze Dressing Change Frequency: Wound #  1 Left,Posterior Lower Leg: Change Dressing Monday, Wednesday, Friday - HHRN to change wraps Monday and Wednesday Wound #2 Right,Posterior Lower Leg: Change Dressing Monday, Wednesday, Friday - HHRN to change wraps Monday and Wednesday Change Dressing Monday, Wednesday, Friday - HHRN to change wraps Monday and Wednesday Follow-up Appointments: Wound #1 Left,Posterior Lower Leg: Return Appointment in 2 weeks. Wound #2 Right,Posterior Lower Leg: Return Appointment in 2 weeks. Edema Control: Wound #1 Left,Posterior Lower Leg: 3 Layer Compression System - Bilateral - May anchor top of wrap with dome paste, but only wrap around leg one time. Elevate legs to the level of the heart and pump ankles as often as possible Compression Pump: Use compression pump on left lower extremity for 30 minutes, twice daily. Compression Pump: Use compression pump on right lower extremity for 30 minutes, twice daily. Wound #2 Right,Posterior Lower Leg: 3 Layer Compression System - Bilateral - May anchor top of wrap with dome paste, but only wrap around leg one time. Elevate legs to the level of the heart and pump ankles as often as possible Compression Pump: Use compression pump on left lower  extremity for 30 minutes, twice daily. Compression Pump: Use compression pump on right lower extremity for 30 minutes, twice daily. Additional Orders / Instructions: Increase protein intake. Home Health: Wound #1 Left,Posterior Lower Leg: Continue Home Health Visits - [facility]---*****HHRN Please see pt next Friday 12/20/16 and we will see her the following Friday after Christmas***** Home Health Nurse may visit PRN to address patient s wound care needs. FACE TO FACE ENCOUNTER: MEDICARE and MEDICAID PATIENTS: I certify that this patient is under my care and that I had a face-to-face encounter that meets the physician face-to-face encounter requirements with this patient on this date. The encounter with the patient was in whole or in part for the following MEDICAL CONDITION: (primary reason for [place]) MEDICAL NECESSITY: I certify, DARIANA, GARBETT ([id number]) that based on my findings, NURSING services are a medically necessary home health service. HOME BOUND STATUS: I certify that my clinical findings support that this patient is homebound (i.e., Due to illness or injury, pt requires aid of supportive devices such as crutches, cane, wheelchairs, walkers, the use of special transportation or the assistance of another person to leave their place of residence. There is a normal inability to leave the home and doing so requires considerable and taxing effort. Other absences are for medical reasons / religious services and are infrequent or of short duration when for other reasons). Please direct any NON-WOUND related issues/requests for orders to patient's Primary Care Physician Wound #2 Right,Posterior Lower Leg: [place] Visits - [facility]---*****HHRN Please see pt next Friday 12/20/16 and we will see her the following Friday after Christmas***** Home Health Nurse may visit PRN to address patient s wound care needs. FACE TO FACE ENCOUNTER: MEDICARE and MEDICAID PATIENTS:  I certify that this patient is under my care and that I had a face-to-face encounter that meets the physician face-to-face encounter requirements with this patient on this date. The encounter with the patient was in whole or in part for the following MEDICAL CONDITION: (primary reason for [place]) MEDICAL NECESSITY: I certify, that based on my findings, NURSING services are a medically necessary home health service. HOME BOUND STATUS: I certify that my clinical findings support that this patient is homebound (i.e., Due to illness or injury, pt requires aid of supportive devices such as crutches, cane, wheelchairs, walkers, the use of special transportation or the assistance  of another person to leave their place of residence. There is a normal inability to leave the home and doing so requires considerable and taxing effort. Other absences are for medical reasons / religious services and are infrequent or of short duration when for other reasons). Please direct any NON-WOUND related issues/requests for orders to patient's Primary Care Physician I have recommended: 1. silver alginate and drawtex and a 3 layer Profore. 2. She will continue with elevation and exercise and her lymphedema pumps and 3. Santyl to be applied at the posterior part of her wounds 4. changing her dressings twice a week. 5. I have had a detailed discussion with the as lymphedema has increased markedly and I was asked to be compliant with the appropriate dietary restriction, elevation and use of her lymphedema pumps. Electronic Signature(s) Signed: 12/27/2016 4:30:20 PM By: Christin Fudge MD, FACS Previous Signature: 12/27/2016 9:03:43 AM Version By: Christin Fudge MD, FACS Entered By: Christin Fudge on 12/27/2016 16:30:20 Brooke Hopkins, Brooke Hopkins ([id number]) -------------------------------------------------------------------------------- SuperBill Details Patient Name: STORMEY, WILBORN. Date of Service:  12/27/2016 Medical Record Number: [id number] Patient Account Number: [id number] Date of Birth/Sex: October 11, 1951 (65 y.o. 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 Physician Procedures CPT4 Code: [id number] Description: 51898 - WC PHYS LEVEL 3 - EST PT ICD-10 Description Diagnosis E11.622 Type 2 diabetes mellitus with other skin ulcer I89.0 Lymphedema, not elsewhere classified L97.222 Non-pressure chronic ulcer of left calf with fat 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 Modifier: layer exposed layer exposed Quantity: 1 Electronic Signature(s) Signed: 12/27/2016 4:27:48 PM By: Christin Fudge MD, FACS Signed: 12/27/2016 5:28:47 PM By: Regan Lemming BSN, RN Previous Signature: 12/27/2016 9:03:56 AM Version By: Christin Fudge MD, FACS Entered By: Regan Lemming on 12/27/2016 09:13:14

## 2016-12-28 NOTE — Progress Notes (Signed)
ARVIE, VILLARRUEL ([id number]) Visit Report for 12/27/2016 Arrival Information Details Patient Name: Brooke Hopkins, Brooke Hopkins. Date of Service: 12/27/2016 8:45 AM Medical Record Number: [id number] Patient Account Number: [id number] Date of Birth/Sex: 1951/01/25 (65 y.o. Female) Treating RN: Afful, RN, BSN, [facility] Primary Care Physician: FITZGERALD, DAVID Other Clinician: Referring Physician: FITZGERALD, DAVID Treating Physician/Extender: Frann Rider in Treatment: 66 Visit Information History Since Last Visit All ordered tests and consults were completed: No Patient Arrived: Wheel Chair Added or deleted any medications: No Arrival Time: 08:35 Any new allergies or adverse reactions: No Accompanied By: son Had a fall or experienced change in No activities of daily living that may affect Transfer Assistance: None risk of falls: Patient Identification Verified: Yes Signs or symptoms of abuse/neglect since last No Secondary Verification Process Yes visito Completed: Has Dressing in Place as Prescribed: Yes Patient Requires Transmission-Based No Has Compression in Place as Prescribed: Yes Precautions: Pain Present Now: No Patient Has Alerts: No Electronic Signature(s) Signed: 12/27/2016 5:28:47 PM By: Regan Lemming BSN, RN Entered By: Regan Lemming on 12/27/2016 08:35:59 [name] ([id number]) -------------------------------------------------------------------------------- Encounter Discharge Information Details Patient Name: Brooke Hopkins, Brooke Hopkins. Date of Service: 12/27/2016 8:45 AM Medical Record Number: [id number] Patient Account Number: [id number] Date of Birth/Sex: 1951/04/20 (65 y.o. Female) Treating RN: Afful, RN, BSN, [facility] Primary Care Physician: FITZGERALD, DAVID Other Clinician: Referring Physician: FITZGERALD, DAVID Treating Physician/Extender: Frann Rider in Treatment: 65 Encounter Discharge Information Items Discharge Pain Level: 0 Discharge Condition:  Stable Ambulatory Status: Wheelchair Discharge Destination: Home Transportation: Private Auto Accompanied By: son Schedule Follow-up Appointment: No Medication Reconciliation completed and provided to Patient/Care No Takina Busser: Provided on Clinical Summary of Care: 12/27/2016 Form Type Recipient Paper Patient LB Electronic Signature(s) Signed: 12/27/2016 5:28:47 PM By: Regan Lemming BSN, RN Previous Signature: 12/27/2016 9:12:54 AM Version By: Ruthine Dose Entered By: Regan Lemming on 12/27/2016 09:14:29 [name] ([id number]) -------------------------------------------------------------------------------- Lower Extremity Assessment Details Patient Name: Brooke Hopkins, Brooke Hopkins. Date of Service: 12/27/2016 8:45 AM Medical Record Number: [id number] Patient Account Number: [id number] Date of Birth/Sex: April 26, 1951 (65 y.o. Female) Treating RN: Afful, RN, BSN, [facility] Primary Care Physician: FITZGERALD, DAVID Other Clinician: Referring Physician: FITZGERALD, DAVID Treating Physician/Extender: Frann Rider in Treatment: 27 Edema Assessment Assessed: [Left: No] [Right: No] Edema: [Left: Yes] [Right: Yes] Calf Left: Right: Point of Measurement: 32 cm From Medial Instep 60.4 cm 60.5 cm Ankle Left: Right: Point of Measurement: 11 cm From Medial Instep 36.5 cm 33.4 cm Vascular Assessment Claudication: Claudication Assessment [Left:None] [Right:None] Pulses: Dorsalis Pedis Palpable: [Left:No] [Right:No] Posterior Tibial Extremity colors, hair growth, and conditions: Extremity Color: [Left:Hyperpigmented] [Right:Hyperpigmented] Hair Growth on Extremity: [Left:No] [Right:No] Temperature of Extremity: [Left:Warm] [Right:Warm] Capillary Refill: [Left:< 3 seconds] [Right:< 3 seconds] Toe Nail Assessment Left: Right: Thick: Yes Yes Discolored: Yes Yes Deformed: No No Improper Length and Hygiene: No No Electronic Signature(s) Signed: 12/27/2016 5:28:47 PM By: Regan Lemming  BSN, RN Entered By: Regan Lemming on 12/27/2016 08:43:48 [name] ([id number]) [name], [name] ([id number]) -------------------------------------------------------------------------------- Multi Wound Chart Details Patient Name: Brooke Hopkins, Brooke Hopkins. Date of Service: 12/27/2016 8:45 AM Medical Record Number: [id number] Patient Account Number: [id number] Date of Birth/Sex: 09/14/1951 (66 y.o. Female) Treating RN: Afful, RN, BSN, [facility] Primary Care Physician: FITZGERALD, DAVID Other Clinician: Referring Physician: FITZGERALD, DAVID Treating Physician/Extender: Frann Rider in Treatment: 27 Vital Signs Height(in): 67 Pulse(bpm): 81 Weight(lbs): 300 Blood Pressure 201/69 (mmHg): Body Mass Index(BMI): 47 Temperature(F): 98.3 Respiratory Rate 18 (breaths/min): Photos: [1:No Photos] [2:No Photos] [N/A:N/A] Wound Location: [1:Left  Lower Leg - Posterior Right Lower Leg -] [2:Posterior] [N/A:N/A] Wounding Event: [1:Gradually Appeared] [2:Gradually Appeared] [N/A:N/A] Primary Etiology: [1:Lymphedema] [2:Lymphedema] [N/A:N/A] Date Acquired: [1:12/31/2015] [2:12/31/2015] [N/A:N/A] Weeks of Treatment: [1:27] [2:27] [N/A:N/A] Wound Status: [1:Open] [2:Open] [N/A:N/A] Clustered Wound: [1:No] [2:Yes] [N/A:N/A] Clustered Quantity: [1:N/A] [2:3] [N/A:N/A] Measurements L x W x D 1x4x0.2 [2:1x1.3x0.2] [N/A:N/A] (cm) Area (cm) : [1:3.142] [2:1.021] [N/A:N/A] Volume (cm) : [1:0.628] [2:0.204] [N/A:N/A] % Reduction in Area: [1:96.90%] [2:99.50%] [N/A:N/A] % Reduction in Volume: 96.90% [2:99.50%] [N/A:N/A] Classification: [1:Partial Thickness] [2:Partial Thickness] [N/A:N/A] Exudate Amount: [1:Large] [2:Large] [N/A:N/A] Exudate Type: [1:Serous] [2:Serous] [N/A:N/A] Exudate Color: [1:amber] [2:amber] [N/A:N/A] Foul Odor After [1:Yes] [2:Yes] [N/A:N/A] Cleansing: Odor Anticipated Due to No [2:No] [N/A:N/A] Product Use: Wound Margin: [1:Flat and Intact] [2:Indistinct, nonvisible]  [N/A:N/A] Granulation Amount: [1:None Present (0%)] [2:None Present (0%)] [N/A:N/A] Necrotic Amount: [1:Large (67-100%)] [2:Large (67-100%)] [N/A:N/A] Exposed Structures: [1:Fascia: No Fat: No Tendon: No] [2:Fascia: No Fat: No Tendon: No] [N/A:N/A] Muscle: No Muscle: No Joint: No Joint: No Bone: No Bone: No Limited to Skin Limited to Skin Breakdown Breakdown Epithelialization: Medium (34-66%) Small (1-33%) N/A Periwound Skin Texture: Edema: Yes Edema: Yes N/A Excoriation: Yes Excoriation: Yes Induration: No Induration: No Callus: No Callus: No Crepitus: No Crepitus: No Fluctuance: No Fluctuance: No Friable: No Friable: No Rash: No Rash: No Scarring: No Scarring: No Periwound Skin Maceration: Yes Maceration: Yes N/A Moisture: Moist: Yes Moist: Yes Dry/Scaly: No Dry/Scaly: No Periwound Skin Color: Rubor: Yes Erythema: Yes N/A Atrophie Blanche: No Hemosiderin Staining: Yes Cyanosis: No Rubor: Yes Ecchymosis: No Atrophie Blanche: No Erythema: No Cyanosis: No Hemosiderin Staining: No Ecchymosis: No Mottled: No Mottled: No Pallor: No Pallor: No Erythema Location: N/A Circumferential N/A Temperature: No Abnormality No Abnormality N/A Tenderness on Yes Yes N/A Palpation: Wound Preparation: Ulcer Cleansing: Other: Ulcer Cleansing: Other: N/A soap and water soap and water Topical Anesthetic Topical Anesthetic Applied: Other: lidocaine Applied: Other: lidocaine 4% 4% Treatment Notes Electronic Signature(s) Signed: 12/27/2016 9:00:51 AM By: Christin Fudge MD, FACS Entered By: Christin Fudge on 12/27/2016 09:00:51 [name] ([id number]) -------------------------------------------------------------------------------- [place] Details Patient Name: Brooke Hopkins, Brooke Hopkins. Date of Service: 12/27/2016 8:45 AM Medical Record Number: [id number] Patient Account Number: [id number] Date of Birth/Sex: [birth year] (65 y.o. Female) Treating RN:  Afful, RN, BSN, [facility] Primary Care Physician: FITZGERALD, DAVID Other Clinician: Referring Physician: FITZGERALD, DAVID Treating Physician/Extender: Frann Rider in Treatment: 29 Active Inactive Orientation to the Wound Care Program Nursing Diagnoses: Knowledge deficit related to the wound healing center program Goals: Patient/caregiver will verbalize understanding of the [facility] [facility] Date Initiated: 06/21/2016 Goal Status: Active Interventions: Provide education on orientation to the wound center Notes: Wound/Skin Impairment Nursing Diagnoses: Impaired tissue integrity Goals: Patient/caregiver will verbalize understanding of skin care regimen Date Initiated: 06/21/2016 Goal Status: Active Ulcer/skin breakdown will have a volume reduction of 30% by week 4 Date Initiated: 06/21/2016 Goal Status: Active Ulcer/skin breakdown will have a volume reduction of 50% by week 8 Date Initiated: 06/21/2016 Goal Status: Active Ulcer/skin breakdown will have a volume reduction of 80% by week 12 Date Initiated: 06/21/2016 Goal Status: Active Ulcer/skin breakdown will heal within 14 weeks Date Initiated: 06/21/2016 Goal Status: Active CATERINE, MCMEANS ([id number]) Interventions: Assess patient/caregiver ability to obtain necessary supplies Assess patient/caregiver ability to perform ulcer/skin care regimen upon admission and as needed Assess ulceration(s) every visit Provide education on ulcer and skin care Notes: Electronic Signature(s) Signed: 12/27/2016 5:28:47 PM By: Regan Lemming BSN, RN Entered By: Regan Lemming on 12/27/2016 08:44:49 Jasko, Karlie  M. ([id number]) -------------------------------------------------------------------------------- Pain Assessment Details Patient Name: Brooke Hopkins, Brooke Hopkins. Date of Service: 12/27/2016 8:45 AM Medical Record Number: [id number] Patient Account Number: [id number] Date of Birth/Sex: 09/09/1951 (65 y.o. Female) Treating  RN: Afful, RN, BSN, [facility] Primary Care Physician: FITZGERALD, DAVID Other Clinician: Referring Physician: FITZGERALD, DAVID Treating Physician/Extender: Frann Rider in Treatment: 27 Active Problems Location of Pain Severity and Description of Pain Patient Has Paino No Site Locations With Dressing Change: No Pain Management and Medication Current Pain Management: Electronic Signature(s) Signed: 12/27/2016 5:28:47 PM By: Regan Lemming BSN, RN Entered By: Regan Lemming on 12/27/2016 08:36:08 [name] ([id number]) -------------------------------------------------------------------------------- Patient/Caregiver Education Details Patient Name: Brooke Hopkins, Brooke Hopkins. Date of Service: 12/27/2016 8:45 AM Medical Record Number: [id number] Patient Account Number: [id number] Date of Birth/Gender: 14-Dec-1951 (65 y.o. Female) Treating RN: Afful, RN, BSN, [facility] Primary Care Physician: FITZGERALD, DAVID Other Clinician: Referring Physician: FITZGERALD, DAVID Treating Physician/Extender: Frann Rider in Treatment: 52 Education Assessment Education Provided To: Patient and Caregiver Education Topics Provided Welcome To [facility] [place]: Methods: Explain/Verbal Responses: State content correctly Wound/Skin Impairment: Methods: Explain/Verbal Responses: State content correctly Electronic Signature(s) Signed: 12/27/2016 5:28:47 PM By: Regan Lemming BSN, RN Entered By: Regan Lemming on 12/27/2016 09:14:41 [name] ([id number]) -------------------------------------------------------------------------------- Wound Assessment Details Patient Name: Brooke Hopkins, Brooke Hopkins. Date of Service: 12/27/2016 8:45 AM Medical Record Number: [id number] Patient Account Number: [id number] Date of Birth/Sex: [birth year] (65 y.o. Female) Treating RN: Afful, RN, BSN, [facility] Primary Care Physician: FITZGERALD, DAVID Other Clinician: Referring Physician: FITZGERALD, DAVID Treating  Physician/Extender: Frann Rider in Treatment: 27 Wound Status Wound Number: 1 Primary Etiology: Lymphedema Wound Location: Left Lower Leg - Posterior Wound Status: Open Wounding Event: Gradually Appeared Date Acquired: 12/31/2015 Weeks Of Treatment: 27 Clustered Wound: No Photos Wound Measurements Length: (cm) 1 Width: (cm) 4 Depth: (cm) 0.2 Area: (cm) 3.142 Volume: (cm) 0.628 % Reduction in Area: 96.9% % Reduction in Volume: 96.9% Epithelialization: Medium (34-66%) Tunneling: No Undermining: No Wound Description Classification: Partial Thickness Wound Margin: Flat and Intact Exudate Amount: Large Exudate Type: Serous Exudate Color: amber Foul Odor After Cleansing: Yes Due to Product Use: No Wound Bed Granulation Amount: None Present (0%) Exposed Structure Necrotic Amount: Large (67-100%) Fascia Exposed: No Necrotic Quality: Adherent Slough Fat Layer Exposed: No Tendon Exposed: No Muscle Exposed: No GLADYS, DECKARD ([id number]) Joint Exposed: No Bone Exposed: No Limited to Skin Breakdown Periwound Skin Texture Texture Color No Abnormalities Noted: No No Abnormalities Noted: No Callus: No Atrophie Blanche: No Crepitus: No Cyanosis: No Excoriation: Yes Ecchymosis: No Fluctuance: No Erythema: No Friable: No Hemosiderin Staining: No Induration: No Mottled: No Localized Edema: Yes Pallor: No Rash: No Rubor: Yes Scarring: No Temperature / Pain Moisture Temperature: No Abnormality No Abnormalities Noted: No Tenderness on Palpation: Yes Dry / Scaly: No Maceration: Yes Moist: Yes Wound Preparation Ulcer Cleansing: Other: soap and water, Topical Anesthetic Applied: Other: lidocaine 4%, Treatment Notes Wound #1 (Left, Posterior Lower Leg) 1. Cleansed with: Cleanse wound with antibacterial soap and water 4. Dressing Applied: Santyl Ointment 5. Secondary Dressing Applied ABD Pad Dry Gauze 7. Secured with 3 Layer Compression System -  Bilateral Electronic Signature(s) Signed: 12/27/2016 5:28:47 PM By: Regan Lemming BSN, RN Signed: 12/27/2016 6:06:14 PM By: Montey Hora Entered By: Montey Hora on 12/27/2016 10:02:13 [name] ([id number]) -------------------------------------------------------------------------------- Wound Assessment Details Patient Name: Brooke Hopkins, Brooke Hopkins. Date of Service: 12/27/2016 8:45 AM Medical Record Number: [id number] Patient Account Number: [id number] Date of Birth/Sex: Sep 07, 1951 (65 y.o. Female) Treating RN:  Afful, RN, BSN, [employer] Primary Care Physician: FITZGERALD, DAVID Other Clinician: Referring Physician: FITZGERALD, DAVID Treating Physician/Extender: Frann Rider in Treatment: 27 Wound Status Wound Number: 2 Primary Etiology: Lymphedema Wound Location: Right Lower Leg - Posterior Wound Status: Open Wounding Event: Gradually Appeared Date Acquired: 12/31/2015 Weeks Of Treatment: 27 Clustered Wound: Yes Photos Wound Measurements Length: (cm) 1 Width: (cm) 1.3 Depth: (cm) 0.2 Clustered Quantity: 3 Area: (cm) 1.021 Volume: (cm) 0.204 % Reduction in Area: 99.5% % Reduction in Volume: 99.5% Epithelialization: Small (1-33%) Tunneling: No Undermining: No Wound Description Classification: Partial Thickness Wound Margin: Indistinct, nonvisible Exudate Amount: Large Exudate Type: Serous Exudate Color: amber Foul Odor After Cleansing: Yes Due to Product Use: No Wound Bed Granulation Amount: None Present (0%) Exposed Structure Necrotic Amount: Large (67-100%) Fascia Exposed: No Necrotic Quality: Adherent Slough Fat Layer Exposed: No Tendon Exposed: No DANAI, GOTTO ([id number]) Muscle Exposed: No Joint Exposed: No Bone Exposed: No Limited to Skin Breakdown Periwound Skin Texture Texture Color No Abnormalities Noted: No No Abnormalities Noted: No Callus: No Atrophie Blanche: No Crepitus: No Cyanosis: No Excoriation: Yes Ecchymosis:  No Fluctuance: No Erythema: Yes Friable: No Erythema Location: Circumferential Induration: No Hemosiderin Staining: Yes Localized Edema: Yes Mottled: No Rash: No Pallor: No Scarring: No Rubor: Yes Moisture Temperature / Pain No Abnormalities Noted: No Temperature: No Abnormality Dry / Scaly: No Tenderness on Palpation: Yes Maceration: Yes Moist: Yes Wound Preparation Ulcer Cleansing: Other: soap and water, Topical Anesthetic Applied: Other: lidocaine 4%, Treatment Notes Wound #2 (Right, Posterior Lower Leg) 1. Cleansed with: Cleanse wound with antibacterial soap and water 4. Dressing Applied: Santyl Ointment 5. Secondary Dressing Applied ABD Pad Dry Gauze 7. Secured with 3 Layer Compression System - Bilateral Electronic Signature(s) Signed: 12/27/2016 5:28:47 PM By: Regan Lemming BSN, RN Signed: 12/27/2016 6:06:14 PM By: Montey Hora Entered By: Montey Hora on 12/27/2016 10:02:40 [name] ([id number]) -------------------------------------------------------------------------------- [place] Details Patient Name: Brooke Hopkins, Brooke Hopkins. Date of Service: 12/27/2016 8:45 AM Medical Record Number: [id number] Patient Account Number: [id number] Date of Birth/Sex: [birth year] (65 y.o. Female) Treating RN: Afful, RN, BSN, [facility] Primary Care Physician: FITZGERALD, DAVID Other Clinician: Referring Physician: FITZGERALD, DAVID Treating Physician/Extender: Frann Rider in Treatment: 27 Vital Signs Time Taken: 08:37 Temperature (F): 98.3 Height (in): 67 Pulse (bpm): 81 Weight (lbs): 300 Respiratory Rate (breaths/min): 18 Body Mass Index (BMI): 47 Blood Pressure (mmHg): 201/69 Reference Range: 80 - 120 mg / dl Electronic Signature(s) Signed: 12/27/2016 5:28:47 PM By: Regan Lemming BSN, RN Entered By: Regan Lemming on 12/27/2016 08:38:26

## 2017-01-03 ENCOUNTER — Ambulatory Visit: Payer: 59 | Admitting: Surgery

## 2017-01-10 ENCOUNTER — Encounter: Payer: 59 | Attending: Surgery | Admitting: Surgery

## 2017-01-10 DIAGNOSIS — I890 Lymphedema, not elsewhere classified: Secondary | ICD-10-CM | POA: Diagnosis not present

## 2017-01-10 DIAGNOSIS — L97222 Non-pressure chronic ulcer of left calf with fat layer exposed: Secondary | ICD-10-CM | POA: Insufficient documentation

## 2017-01-10 DIAGNOSIS — Z87891 Personal history of nicotine dependence: Secondary | ICD-10-CM | POA: Diagnosis not present

## 2017-01-10 DIAGNOSIS — Z6841 Body Mass Index (BMI) 40.0 and over, adult: Secondary | ICD-10-CM | POA: Diagnosis not present

## 2017-01-10 DIAGNOSIS — I10 Essential (primary) hypertension: Secondary | ICD-10-CM | POA: Insufficient documentation

## 2017-01-10 DIAGNOSIS — E1140 Type 2 diabetes mellitus with diabetic neuropathy, unspecified: Secondary | ICD-10-CM | POA: Diagnosis not present

## 2017-01-10 DIAGNOSIS — E11622 Type 2 diabetes mellitus with other skin ulcer: Secondary | ICD-10-CM | POA: Insufficient documentation

## 2017-01-10 DIAGNOSIS — L97212 Non-pressure chronic ulcer of right calf with fat layer exposed: Secondary | ICD-10-CM | POA: Diagnosis not present

## 2017-01-11 NOTE — Progress Notes (Signed)
DIANEY, SUCHY ([id number]) Visit Report for 01/10/2017 Chief Complaint Document Details Patient Name: Brooke Hopkins, Brooke Hopkins. Date of Service: 01/10/2017 8:45 AM Medical Record Number: [id number] Patient Account Number: [id number] Date of Birth/Sex: 18-Jul-1951 (66 y.o. Female) Treating RN: Ahmed Prima Primary Care Physician: FITZGERALD, DAVID Other Clinician: Referring Physician: FITZGERALD, DAVID Treating Physician/Extender: Frann Rider in Treatment: 29 Information Obtained from: Patient Chief Complaint [name] returns for follow-up to her bilateral lower extremity ulcers Electronic Signature(s) Signed: 01/10/2017 9:12:59 AM By: Christin Fudge MD, FACS Entered By: Christin Fudge on 01/10/2017 09:12:59 [name] ([id number]) -------------------------------------------------------------------------------- Debridement Details Patient Name: Brooke Hopkins, Brooke Hopkins. Date of Service: 01/10/2017 8:45 AM Medical Record Number: [id number] Patient Account Number: [id number] Date of Birth/Sex: Aug 15, 1951 (66 y.o. Female) Treating RN: Ahmed Prima Primary Care Physician: FITZGERALD, DAVID Other Clinician: Referring Physician: FITZGERALD, DAVID Treating Physician/Extender: Frann Rider in Treatment: 29 Debridement Performed for Wound #1 Left,Posterior Lower Leg Assessment: Performed By: Physician Christin Fudge, MD Debridement: Debridement Pre-procedure Yes - 09:06 Verification/Time Out Taken: Start Time: 09:09 Pain Control: Lidocaine 4% Topical Solution Level: Skin/Subcutaneous Tissue Total Area Debrided (L x 1.2 (cm) x 2 (cm) = 2.4 (cm) W): Tissue and other Viable, Non-Viable, Exudate, Fibrin/Slough, Subcutaneous material debrided: Instrument: Curette Bleeding: Minimum Hemostasis Achieved: Pressure End Time: 09:11 Procedural Pain: 0 Post Procedural Pain: 0 Response to Treatment: Procedure was tolerated well Post Debridement Measurements of Total  Wound Length: (cm) 1.2 Width: (cm) 2 Depth: (cm) 0.2 Volume: (cm) 0.377 Character of Wound/Ulcer Post Requires Further Debridement Debridement: Severity of Tissue Post Debridement: Fat layer exposed Post Procedure Diagnosis Same as Pre-procedure Electronic Signature(s) Signed: 01/10/2017 9:12:40 AM By: Christin Fudge MD, FACS Signed: 01/10/2017 4:51:47 PM By: Alric Quan Entered By: Christin Fudge on 01/10/2017 09:12:40 [name] ([name], Brooke Hopkins ([id number]) -------------------------------------------------------------------------------- Debridement Details Patient Name: Brooke Hopkins, Brooke Hopkins. Date of Service: 01/10/2017 8:45 AM Medical Record Number: [id number] Patient Account Number: [id number] Date of Birth/Sex: 05/22/1951 (66 y.o. Female) Treating RN: Ahmed Prima Primary Care Physician: FITZGERALD, DAVID Other Clinician: Referring Physician: FITZGERALD, DAVID Treating Physician/Extender: Frann Rider in Treatment: 29 Debridement Performed for Wound #2 Right,Posterior Lower Leg Assessment: Performed By: Physician Christin Fudge, MD Debridement: Debridement Pre-procedure Yes - 09:06 Verification/Time Out Taken: Start Time: 09:07 Pain Control: Lidocaine 4% Topical Solution Level: Skin/Subcutaneous Tissue Total Area Debrided (L x 4 (cm) x 1 (cm) = 4 (cm) W): Tissue and other Viable, Non-Viable, Exudate, Fibrin/Slough, Subcutaneous material debrided: Instrument: Curette Bleeding: Minimum Hemostasis Achieved: Pressure End Time: 09:09 Procedural Pain: 0 Post Procedural Pain: 0 Response to Treatment: Procedure was tolerated well Post Debridement Measurements of Total Wound Length: (cm) 4 Width: (cm) 1 Depth: (cm) 0.2 Volume: (cm) 0.628 Character of Wound/Ulcer Post Requires Further Debridement Debridement: Severity of Tissue Post Debridement: Fat layer exposed Post Procedure Diagnosis Same as Pre-procedure Electronic  Signature(s) Signed: 01/10/2017 9:12:52 AM By: Christin Fudge MD, FACS Signed: 01/10/2017 4:51:47 PM By: Alric Quan Entered By: Christin Fudge on 01/10/2017 09:12:52 [name] ([name], Brooke Hopkins ([id number]) -------------------------------------------------------------------------------- HPI Details Patient Name: Brooke Hopkins, Brooke Hopkins. Date of Service: 01/10/2017 8:45 AM Medical Record Number: [id number] Patient Account Number: [id number] Date of Birth/Sex: 02-Apr-1951 (66 y.o. Female) Treating RN: Ahmed Prima Primary Care Physician: FITZGERALD, DAVID Other Clinician: Referring Physician: FITZGERALD, DAVID Treating Physician/Extender: Frann Rider in Treatment: 29 History of Present Illness Location: massive swelling of both lower extremities and ulceration both lower extremities Quality: Patient reports experiencing a dull pain to affected area(s). Severity: Patient  states wound are getting worse. Duration: Patient has had the wound for >2 years prior to seeking treatment at the wound center Timing: Pain in wound is constant (hurts all the time) Context: The wound appeared gradually over time Modifying Factors: Other treatment(s) tried include:she has a lymphedema pump but uses it seldom and she's had several course of antibiotics Associated Signs and Symptoms: Patient reports having difficulty standing for long periods. HPI Description: 66 year old patient seen by Dr. Ola Spurr of infectious disease who has been following up for left lower extremity cellulitis and ulcer with recurrent bilateral lower extremity problems for several months. Recently she had a large right lower extremity bullae which opened out and has been ulcerated. She has seen the vascular group and has been getting Unna's wraps and has a lymphedema pump used in the past. Her prior cultures were positive for Pseudomonas, Proteus and was treated with amoxicillin. He has also been  treated with 2 weeks course of ciprofloxacin and amoxicillin.. Increase of Lasix dose helped with the edema and echo showed no systolic CHF but may be diastolic problems. Past medical history significant for diabetes mellitus type 2, venous stasis ulcer, obesity, diabetic peripheral neuropathy, status post back surgery, cholecystectomy, hysterectomy, arthroscopy of the knee. He is a former smoker and quit smoking in 1984. The patient has seen Dr. Delana Meyer who did not recommend any arterial or venous duplex studies and has been using Unna's wraps and also recommended a lymphedema pump. she has been very noncompliant with using these. 06/28/2016 -- the patient is still on antibiotics as prescribed by Dr. Ola Spurr and he is asked her to take it for 3 weeks. The patient also says she has a lot of redness and pain in the folds of her thigh and lower extremity and this is very painful. 07/26/2016 -- the patient is off antibiotics and has been getting dressing changes 3 times a week. 08/09/2016 -- is been on Cipro and is taking potassium supplements along with her Lasix and is going to be seeing Dr. Ola Spurr for a consult return visit only in November 2017. 08/23/2016 -- she was admitted to the hospital last week and I have reviewed these reports in detail. She was seen by Dr. Ola Spurr who noted a Pseudomonas infection which was resistant to ciprofloxacin and discharged her on Ceftazidime 1 g IV every 12 hourly anyone days. The antibiotic was to be stopped on September 11 and he would see her back in the clinic. 08/30/2016 -- had a communication from Dr. Ola Spurr that he would extend her antibiotics by a week if she continued to look like cellulitis was persisting. Brooke Hopkins, Brooke Hopkins ([id number]) 09/13/2016 -- the PICC line is out and antibiotics have stopped. 10/04/16: returns today for f/u. reports utilizes compression pump twice daily. she is compliant with her compression therapy. she  reports a new blister on the right proximal posterior calf. no systemic s/s of infection. 10/11/16: returns today for f/u. she reports adherence to her compression pumps, but there is no improvement regarding her BLE edema. there is weeping of fluid. denies fever, chills, body aches or malaise. 10/18/2016 -- she has a significant cellulitis of her right lower extremity which was not there last week. I believe at this stage she will need the expertise of Dr. Ola Spurr to decide on antibiotic coverage. 10/25/2016 -- the patient has had cipro and ampicillin been started by Dr. Ola Spurr and he is awaiting further cultures. Overall she's been feeling better. 11/08/2016 -- Dr. Ola Spurr this week who  has continued with ciprofloxacin and amoxicillin and is awaiting further cultures before deciding to place her on a PICC line. 11/15/2016 -- Dr. Blane Ohara note was reviewed and her cultures growing Pseudomonas in both legs which is sensitive to Cipro and the other is resistant to Cipro and she would get a PICC line and start IV antibiotics -- Ceftazidime 1 g q 12 for 3 weeks. 11-29-16 Mrs. Kook presents today in follow-up of her bilateral lower exterminators ulcers. She remains on IV antibiotic therapy via a PICC line per Dr. Ola Spurr; she has a follow-up appointment with him on Monday, 12/02/2016. She anticipates that the about therapy will be discontinued at that appointment. She denies any complications of nausea vomiting and/or diarrhea accompanied by this antibiotic therapy. She admits to using her lymphedema pumps twice daily with the exception of yesterday. She denies any concerns or complications with the current treatment plan. 12/06/2016 -- Dr. Ola Spurr saw her recently on 12/02/2016 and due to the fact she has had significant problems with recurrent ulceration and Pseudomonas infection he recommended 3 more weeks of antibiotics and extended it until December 25. The PICC line  will be in place. Last hemoglobin A1c on November 30 was 9% 12/27/2016 -- because of the holidays the patient's diet has been higher in salt, her dressings have not been done as required and she is not used to lymphedema pumps appropriately. This has led to her lymphedema increasing markedly. Electronic Signature(s) Signed: 01/10/2017 9:13:09 AM By: Christin Fudge MD, FACS Entered By: Christin Fudge on 01/10/2017 09:13:09 [name] ([id number]) -------------------------------------------------------------------------------- Physical Exam Details Patient Name: SHANIN, SZYMANOWSKI. Date of Service: 01/10/2017 8:45 AM Medical Record Number: [id number] Patient Account Number: [id number] Date of Birth/Sex: 28-Nov-1951 (66 y.o. Female) Treating RN: Ahmed Prima Primary Care Physician: FITZGERALD, DAVID Other Clinician: Referring Physician: FITZGERALD, DAVID Treating Physician/Extender: Frann Rider in Treatment: 29 Constitutional . Pulse regular. Respirations normal and unlabored. Afebrile. . Eyes Nonicteric. Reactive to light. Ears, Nose, Mouth, and Throat Lips, teeth, and gums WNL.Marland Kitchen Moist mucosa without lesions. Neck supple and nontender. No palpable supraclavicular or cervical adenopathy. Normal sized without goiter. Respiratory WNL. No retractions.. Cardiovascular Pedal Pulses WNL. No clubbing, cyanosis or edema. Lymphatic No adneopathy. No adenopathy. No adenopathy. Musculoskeletal Adexa without tenderness or enlargement.. Digits and nails w/o clubbing, cyanosis, infection, petechiae, ischemia, or inflammatory conditions.. Integumentary (Hair, Skin) No suspicious lesions. No crepitus or fluctuance. No peri-wound warmth or erythema. No masses.Marland Kitchen Psychiatric Judgement and insight Intact.. No evidence of depression, anxiety, or agitation.. Notes suite the lymphedema is better and the weeping is less though she still has significant lymphedema overall. She needed  sharp debridement of her ulcers posteriorly which are covered with subcutaneous debris and have done this with a #3 curet and bleeding controlled with pressure Electronic Signature(s) Signed: 01/10/2017 9:13:50 AM By: Christin Fudge MD, FACS Entered By: Christin Fudge on 01/10/2017 09:13:50 [name] ([id number]) -------------------------------------------------------------------------------- Physician Orders Details Patient Name: Brooke Hopkins, PELLEGRINI. Date of Service: 01/10/2017 8:45 AM Medical Record Number: [id number] Patient Account Number: [id number] Date of Birth/Sex: [birth year] (66 y.o. Female) Treating RN: Ahmed Prima Primary Care Physician: FITZGERALD, DAVID Other Clinician: Referring Physician: FITZGERALD, DAVID Treating Physician/Extender: Frann Rider in Treatment: 28 Verbal / Phone Orders: Yes Clinician: Carolyne Fiscal, Debi Read Back and Verified: Yes Diagnosis Coding Wound Cleansing Wound #1 Left,Posterior Lower Leg o Clean wound with Normal Saline. - clinic use o Cleanse wound with mild soap and water - HHRN please scrub wounds with mild soap  and water o May Shower, gently pat wound dry prior to applying new dressing. o May shower with protection. Wound #2 Right,Posterior Lower Leg o Clean wound with Normal Saline. - clinic use o Cleanse wound with mild soap and water - HHRN please scrub wounds with mild soap and water o May Shower, gently pat wound dry prior to applying new dressing. o May shower with protection. Anesthetic Wound #1 Left,Posterior Lower Leg o Topical Lidocaine 4% cream applied to wound bed prior to debridement - clinic use Wound #2 Right,Posterior Lower Leg o Topical Lidocaine 4% cream applied to wound bed prior to debridement - clinic use Primary Wound Dressing Wound #1 Left,Posterior Lower Leg o Santyl Ointment - use on the posterior leg wounds Wound #2 Right,Posterior Lower Leg o Santyl Ointment - use on the  posterior leg wounds Secondary Dressing Wound #1 Left,Posterior Lower Leg o ABD pad o Dry Gauze Wound #2 Right,Posterior Lower Leg o ABD pad o Dry Gauze Brooke Hopkins, Brooke Hopkins ([id number]) Dressing Change Frequency Wound #1 Left,Posterior Lower Leg o Change Dressing Monday, Wednesday, Friday - HHRN to change wraps Monday and Wednesday Wound #2 Right,Posterior Lower Leg o Change Dressing Monday, Wednesday, Friday - HHRN to change wraps Monday and Wednesday o Change Dressing Monday, Wednesday, Friday - HHRN to change wraps Monday and Wednesday Follow-up Appointments Wound #1 Left,Posterior Lower Leg o Return Appointment in 1 week. Wound #2 Right,Posterior Lower Leg o Return Appointment in 1 week. Edema Control Wound #1 Left,Posterior Lower Leg o 3 Layer Compression System - Bilateral - May anchor top of wrap with dome paste, but only wrap around leg one time. o Elevate legs to the level of the heart and pump ankles as often as possible o Compression Pump: Use compression pump on left lower extremity for 30 minutes, twice daily. o Compression Pump: Use compression pump on right lower extremity for 30 minutes, twice daily. Wound #2 Right,Posterior Lower Leg o 3 Layer Compression System - Bilateral - May anchor top of wrap with dome paste, but only wrap around leg one time. o Elevate legs to the level of the heart and pump ankles as often as possible o Compression Pump: Use compression pump on left lower extremity for 30 minutes, twice daily. o Compression Pump: Use compression pump on right lower extremity for 30 minutes, twice daily. Additional Orders / Instructions o Increase protein intake. Home Health Wound #1 [place] Visits - [place] Nurse may visit PRN to address patientos wound care needs. o FACE TO FACE ENCOUNTER: MEDICARE and MEDICAID PATIENTS: I certify that this patient  is under my care and that I had a face-to-face encounter that meets the physician face-to-face encounter requirements with this patient on this date. The encounter with the patient was in whole or in part for the following MEDICAL CONDITION: (primary reason for [place]) MEDICAL NECESSITY: I certify, that based on my findings, NURSING services are a medically necessary home health service. HOME BOUND STATUS: I certify that my clinical findings support that this patient is homebound (i.e., Due to illness or injury, pt requires aid of supportive devices such as crutches, cane, wheelchairs, walkers, the use of special NIALAH, SARAVIA. ([id number]) transportation or the assistance of another person to leave their place of residence. There is a normal inability to leave the home and doing so requires considerable and taxing effort. Other absences are for medical reasons / religious services and are infrequent or of short duration  when for other reasons). o Please direct any NON-WOUND related issues/requests for orders to patient's Primary Care Physician Wound #2 [place] Visits - [place] Nurse may visit PRN to address patientos wound care needs. o FACE TO FACE ENCOUNTER: MEDICARE and MEDICAID PATIENTS: I certify that this patient is under my care and that I had a face-to-face encounter that meets the physician face-to-face encounter requirements with this patient on this date. The encounter with the patient was in whole or in part for the following MEDICAL CONDITION: (primary reason for [place]) MEDICAL NECESSITY: I certify, that based on my findings, NURSING services are a medically necessary home health service. HOME BOUND STATUS: I certify that my clinical findings support that this patient is homebound (i.e., Due to illness or injury, pt requires aid of supportive devices such as crutches, cane, wheelchairs,  walkers, the use of special transportation or the assistance of another person to leave their place of residence. There is a normal inability to leave the home and doing so requires considerable and taxing effort. Other absences are for medical reasons / religious services and are infrequent or of short duration when for other reasons). o Please direct any NON-WOUND related issues/requests for orders to patient's Primary Care Physician Electronic Signature(s) Signed: 01/10/2017 4:00:20 PM By: Christin Fudge MD, FACS Entered By: Christin Fudge on 01/10/2017 09:14:05 [name] ([id number]) -------------------------------------------------------------------------------- Problem List Details Patient Name: CLEOTILDE, SPADACCINI. Date of Service: 01/10/2017 8:45 AM Medical Record Number: [id number] Patient Account Number: [id number] Date of Birth/Sex: 1951/10/12 (66 y.o. 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 Date of Service: 01/10/2017 8:45 AM Medical  Record Number: [id number] Patient Account Number: [id number] Date of Birth/Sex: [birth year] (66 y.o. Female) Treating RN: Ahmed Prima Primary Care Physician: FITZGERALD, DAVID Other Clinician: Referring Physician: FITZGERALD, DAVID Treating Physician/Extender: Frann Rider in Treatment: 65 Subjective Chief Complaint Information obtained from Patient Mrs. Dicenso returns for follow-up to her bilateral lower extremity ulcers History of Present Illness (HPI) The following HPI elements were documented for the patient's wound: Location: massive swelling of both lower extremities and ulceration both lower extremities Quality: Patient reports experiencing a dull pain to affected area(s). Severity: Patient states wound are getting worse. Duration: Patient has had the wound for >2 years prior to seeking treatment at the wound center Timing: Pain in wound is constant (hurts all the time) Context: The wound appeared gradually over time Modifying Factors: Other treatment(s) tried include:she has a lymphedema pump but uses it seldom and she's had several course of antibiotics Associated Signs and Symptoms: Patient reports having difficulty standing for long periods. 66 year old patient seen by Dr. Ola Spurr of infectious disease who has been following up for left lower extremity cellulitis and ulcer with recurrent bilateral lower extremity problems for several months. Recently  she had a large right lower extremity bullae which opened out and has been ulcerated. She has seen the vascular group and has been getting Unna's wraps and has a lymphedema pump used in the past. Her prior cultures were positive for Pseudomonas, Proteus and was treated with amoxicillin. He has also been treated with 2 weeks course of ciprofloxacin and amoxicillin.. Increase of Lasix dose helped with the edema and echo showed no systolic CHF but may be diastolic problems. Past medical history significant for diabetes  mellitus type 2, venous stasis ulcer, obesity, diabetic peripheral neuropathy, status post back surgery, cholecystectomy, hysterectomy, arthroscopy of the knee. He is a former smoker and quit smoking in 1984. The patient has seen Dr. Delana Meyer who did not recommend any arterial or venous duplex studies and has been using Unna's wraps and also recommended a lymphedema pump. she has been very noncompliant with using these. 06/28/2016 -- the patient is still on antibiotics as prescribed by Dr. Ola Spurr and he is asked her to take it for 3 weeks. The patient also says she has a lot of redness and pain in the folds of her thigh and lower extremity and this is very painful. 07/26/2016 -- the patient is off antibiotics and has been getting dressing changes 3 times a week. 08/09/2016 -- is been on Cipro and is taking potassium supplements along with her Lasix and is going to be seeing Dr. Ola Spurr for a consult return visit only in November 2017. Brooke Hopkins, Brooke Hopkins ([id number]) 08/23/2016 -- she was admitted to the hospital last week and I have reviewed these reports in detail. She was seen by Dr. Ola Spurr who noted a Pseudomonas infection which was resistant to ciprofloxacin and discharged her on Ceftazidime 1 g IV every 12 hourly anyone days. The antibiotic was to be stopped on September 11 and he would see her back in the clinic. 08/30/2016 -- had a communication from Dr. Ola Spurr that he would extend her antibiotics by a week if she continued to look like cellulitis was persisting. 09/13/2016 -- the PICC line is out and antibiotics have stopped. 10/04/16: returns today for f/u. reports utilizes compression pump twice daily. she is compliant with her compression therapy. she reports a new blister on the right proximal posterior calf. no systemic s/s of infection. 10/11/16: returns today for f/u. she reports adherence to her compression pumps, but there is no improvement regarding her BLE  edema. there is weeping of fluid. denies fever, chills, body aches or malaise. 10/18/2016 -- she has a significant cellulitis of her right lower extremity which was not there last week. I believe at this stage she will need the expertise of Dr. Ola Spurr to decide on antibiotic coverage. 10/25/2016 -- the patient has had cipro and ampicillin been started by Dr. Ola Spurr and he is awaiting further cultures. Overall she's been feeling better. 11/08/2016 -- Dr. Ola Spurr this week who has continued with ciprofloxacin and amoxicillin and is awaiting further cultures before deciding to place her on a PICC line. 11/15/2016 -- Dr. Blane Ohara note was reviewed and her cultures growing Pseudomonas in both legs which is sensitive to Cipro and the other is resistant to Cipro and she would get a PICC line and start IV antibiotics -- Ceftazidime 1 g q 12 for 3 weeks. 11-29-16 Mrs. Vitale presents today in follow-up of her bilateral lower exterminators ulcers. She remains on IV antibiotic therapy via a PICC line per Dr. Ola Spurr; she has a follow-up appointment with him on Monday, 12/02/2016. She anticipates  that the about therapy will be discontinued at that appointment. She denies any complications of nausea vomiting and/or diarrhea accompanied by this antibiotic therapy. She admits to using her lymphedema pumps twice daily with the exception of yesterday. She denies any concerns or complications with the current treatment plan. 12/06/2016 -- Dr. Ola Spurr saw her recently on 12/02/2016 and due to the fact she has had significant problems with recurrent ulceration and Pseudomonas infection he recommended 3 more weeks of antibiotics and extended it until December 25. The PICC line will be in place. Last hemoglobin A1c on November 30 was 9% 12/27/2016 -- because of the holidays the patient's diet has been higher in salt, her dressings have not been done as required and she is not used to  lymphedema pumps appropriately. This has led to her lymphedema increasing markedly. Objective Constitutional Pulse regular. Respirations normal and unlabored. Afebrile. Brooke Hopkins, Brooke Hopkins. ([id number]) Vitals Time Taken: 8:41 AM, Height: 67 in, Weight: 300 lbs, BMI: 47, Temperature: 98.2 F, Pulse: 75 bpm, Respiratory Rate: 18 breaths/min, Blood Pressure: 130/100 mmHg. Eyes Nonicteric. Reactive to light. Ears, Nose, Mouth, and Throat Lips, teeth, and gums WNL.Marland Kitchen Moist mucosa without lesions. Neck supple and nontender. No palpable supraclavicular or cervical adenopathy. Normal sized without goiter. Respiratory WNL. No retractions.. Cardiovascular Pedal Pulses WNL. No clubbing, cyanosis or edema. Lymphatic No adneopathy. No adenopathy. No adenopathy. Musculoskeletal Adexa without tenderness or enlargement.. Digits and nails w/o clubbing, cyanosis, infection, petechiae, ischemia, or inflammatory conditions.Marland Kitchen Psychiatric Judgement and insight Intact.. No evidence of depression, anxiety, or agitation.. General Notes: suite the lymphedema is better and the weeping is less though she still has significant lymphedema overall. She needed sharp debridement of her ulcers posteriorly which are covered with subcutaneous debris and have done this with a #3 curet and bleeding controlled with pressure Integumentary (Hair, Skin) No suspicious lesions. No crepitus or fluctuance. No peri-wound warmth or erythema. No masses.. Wound #1 status is Open. Original cause of wound was Gradually Appeared. The wound is located on the Left,Posterior Lower Leg. The wound measures 1.2cm length x 2cm width x 0.2cm depth; 1.885cm^2 area and 0.377cm^3 volume. The wound is limited to skin breakdown. There is no tunneling or undermining noted. There is a large amount of serous drainage noted. The wound margin is flat and intact. There is no granulation within the wound bed. There is a large (67-100%) amount of necrotic  tissue within the wound bed including Adherent Slough. The periwound skin appearance exhibited: Excoriation, Localized Edema, Maceration, Moist, Rubor. The periwound skin appearance did not exhibit: Callus, Crepitus, Fluctuance, Friable, Induration, Rash, Scarring, Dry/Scaly, Atrophie Blanche, Cyanosis, Ecchymosis, Hemosiderin Staining, Mottled, Pallor, Erythema. Periwound temperature was noted as No Abnormality. The periwound has tenderness on palpation. Wound #2 status is Open. Original cause of wound was Gradually Appeared. The wound is located on the Right,Posterior Lower Leg. The wound measures 4cm length x 1cm width x 0.2cm depth; 3.142cm^2 area Brooke Hopkins, Brooke Hopkins. ([id number]) and 0.628cm^3 volume. The wound is limited to skin breakdown. There is no tunneling or undermining noted. There is a large amount of serous drainage noted. The wound margin is indistinct and nonvisible. There is no granulation within the wound bed. There is a large (67-100%) amount of necrotic tissue within the wound bed including Adherent Slough. The periwound skin appearance exhibited: Excoriation, Localized Edema, Maceration, Moist, Hemosiderin Staining, Rubor, Erythema. The periwound skin appearance did not exhibit: Callus, Crepitus, Fluctuance, Friable, Induration, Rash, Scarring, Dry/Scaly, Atrophie Blanche, Cyanosis, Ecchymosis, Mottled, Pallor. The surrounding  wound skin color is noted with erythema which is circumferential. Periwound temperature was noted as No Abnormality. The periwound has tenderness on palpation. 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 There was a Skin/Subcutaneous Tissue Debridement ([id number]) debridement with total area of 2.4 sq cm performed by Christin Fudge, MD. with the following instrument(s): Curette to remove Viable and Non-Viable tissue/material including Exudate, Fibrin/Slough, and Subcutaneous after achieving pain control using Lidocaine 4% Topical Solution. A time out was conducted at 09:06, prior to the start of the procedure. A Minimum amount of bleeding was controlled with Pressure. The procedure was tolerated well with a pain level of 0 throughout and a pain level of 0 following the procedure. Post Debridement Measurements: 1.2cm length x 2cm width x 0.2cm depth; 0.377cm^3 volume. Character of Wound/Ulcer Post Debridement requires further debridement. Severity of Tissue Post Debridement is: Fat layer exposed. Post procedure Diagnosis Wound #1: Same as Pre-Procedure Wound #2 Wound #2 is a Lymphedema located on the Right,Posterior Lower Leg . There was a Skin/Subcutaneous Tissue Debridement ([id number]) debridement with total area of 4 sq cm performed by Christin Fudge, MD. with the following instrument(s): Curette to remove Viable and Non-Viable tissue/material including Exudate, Fibrin/Slough, and Subcutaneous after achieving pain control using Lidocaine 4% Topical Solution. A time SALEHA, KALP ([id number]) out was conducted at 09:06, prior to the start of the procedure. A Minimum amount of bleeding was controlled with Pressure. The procedure was tolerated well with a pain level of 0 throughout and a pain level of 0 following the procedure. Post Debridement Measurements: 4cm length x 1cm width x 0.2cm depth; 0.628cm^3 volume. Character of Wound/Ulcer Post Debridement requires further debridement. Severity of Tissue Post Debridement is: Fat layer exposed. Post procedure Diagnosis Wound #2: Same as Pre-Procedure Plan Wound Cleansing: Wound #1 Left,Posterior Lower  Leg: Clean wound with Normal Saline. - clinic use Cleanse wound with mild soap and water - HHRN please scrub wounds with mild soap and water May Shower, gently pat wound dry prior to applying new dressing. May shower with protection. Wound #2 Right,Posterior Lower Leg: Clean wound with Normal Saline. - clinic use Cleanse wound with mild soap and water - HHRN please scrub wounds with mild soap and water May Shower, gently pat wound dry prior to applying new dressing. May shower with protection. Anesthetic: Wound #1 Left,Posterior Lower Leg: Topical Lidocaine 4% cream applied to wound bed prior to debridement - clinic use Wound #2 Right,Posterior Lower Leg: Topical Lidocaine 4% cream applied to wound bed prior to debridement - clinic use Primary Wound Dressing: Wound #1 Left,Posterior Lower Leg: Santyl Ointment - use on the posterior leg wounds Wound #2 Right,Posterior Lower Leg: Santyl Ointment - use on the posterior leg wounds Secondary Dressing: Wound #1 Left,Posterior Lower Leg: ABD pad Dry Gauze Wound #2 Right,Posterior Lower Leg: ABD pad Dry Gauze Dressing Change Frequency: Wound #1 Left,Posterior Lower Leg: Change Dressing Monday, Wednesday, Friday - HHRN to change wraps Monday and Wednesday Wound #2 Right,Posterior Lower Leg: Change Dressing Monday, Wednesday, Friday - HHRN to change wraps Monday and Wednesday Change Dressing Monday, Wednesday, Friday - HHRN to change wraps Monday and Wednesday  Follow-up Appointments: DOMANIQUE, HUESMAN ([id number]) Wound #1 Left,Posterior Lower Leg: Return Appointment in 1 week. Wound #2 Right,Posterior Lower Leg: Return Appointment in 1 week. Edema Control: Wound #1 Left,Posterior Lower Leg: 3 Layer Compression System - Bilateral - May anchor top of wrap with dome paste, but only wrap around leg one time. Elevate legs to the level of the heart and pump ankles as often as possible Compression Pump: Use compression pump on left lower  extremity for 30 minutes, twice daily. Compression Pump: Use compression pump on right lower extremity for 30 minutes, twice daily. Wound #2 Right,Posterior Lower Leg: 3 Layer Compression System - Bilateral - May anchor top of wrap with dome paste, but only wrap around leg one time. Elevate legs to the level of the heart and pump ankles as often as possible Compression Pump: Use compression pump on left lower extremity for 30 minutes, twice daily. Compression Pump: Use compression pump on right lower extremity for 30 minutes, twice daily. Additional Orders / Instructions: Increase protein intake. Home Health: Wound #1 Left,Posterior Lower Leg: Continue Home Health Visits - [place] Nurse may visit PRN to address patient s wound care needs. FACE TO FACE ENCOUNTER: MEDICARE and MEDICAID PATIENTS: I certify that this patient is under my care and that I had a face-to-face encounter that meets the physician face-to-face encounter requirements with this patient on this date. The encounter with the patient was in whole or in part for the following MEDICAL CONDITION: (primary reason for [place]) MEDICAL NECESSITY: I certify, that based on my findings, NURSING services are a medically necessary home health service. HOME BOUND STATUS: I certify that my clinical findings support that this patient is homebound (i.e., Due to illness or injury, pt requires aid of supportive devices such as crutches, cane, wheelchairs, walkers, the use of special transportation or the assistance of another person to leave their place of residence. There is a normal inability to leave the home and doing so requires considerable and taxing effort. Other absences are for medical reasons / religious services and are infrequent or of short duration when for other reasons). Please direct any NON-WOUND related issues/requests for orders to patient's Primary Care Physician Wound #2 Right,Posterior Lower  Leg: [place] Visits - [place] Nurse may visit PRN to address patient s wound care needs. FACE TO FACE ENCOUNTER: MEDICARE and MEDICAID PATIENTS: I certify that this patient is under my care and that I had a face-to-face encounter that meets the physician face-to-face encounter requirements with this patient on this date. The encounter with the patient was in whole or in part for the following MEDICAL CONDITION: (primary reason for [place]) MEDICAL NECESSITY: I certify, that based on my findings, NURSING services are a medically necessary home health service. HOME BOUND STATUS: I certify that my clinical findings support that this patient is homebound (i.e., Due to illness or injury, pt requires aid of supportive devices such as crutches, cane, wheelchairs, walkers, the use of special transportation or the assistance of another person to leave their place of residence. There is a normal inability to leave the home and doing so requires considerable and taxing effort. Other absences are for medical reasons / religious services and are infrequent or of short duration when for other reasons). Please direct any NON-WOUND related issues/requests for orders to patient's Primary Care Physician KIMMY, TOTTEN. ([id number]) I have recommended: 1. silver alginate and drawtex and a 3 layer Profore. 2. She will continue  with elevation and exercise and her lymphedema pumps and 3. Santyl to be applied at the posterior part of her wounds 4. changing her dressings twice a week. 5. I have had a detailed discussion with the as lymphedema has increased markedly and I was asked to be compliant with the appropriate dietary restriction, elevation and use of her lymphedema pumps. Electronic Signature(s) Signed: 01/10/2017 9:14:26 AM By: Christin Fudge MD, FACS Entered By: Christin Fudge on 01/10/2017 09:14:26 [name]  ([id number]) -------------------------------------------------------------------------------- SuperBill Details Patient Name: WAKISHA, ALBERTS. Date of Service: 01/10/2017 Medical Record Number: [id number] Patient Account Number: [id number] Date of Birth/Sex: 07/16/1951 (66 y.o. 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 E66.01 Morbid (severe) obesity due to excess calories Facility Procedures CPT4 Code: [id number] Description: 98338 - DEB SUBQ TISSUE 20 SQ CM/< ICD-10 Description Diagnosis E11.622 Type 2 diabetes mellitus with other skin ulcer I89.0 Lymphedema, not elsewhere classified L97.222 Non-pressure chronic ulcer of left calf with fat l 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 Modifier: ayer exposed layer exposed Quantity: 1 Physician Procedures CPT4 Code: [id number] Description: 67341 - WC PHYS SUBQ TISS 20 SQ CM ICD-10 Description Diagnosis E11.622 Type 2 diabetes mellitus with other skin ulcer I89.0 Lymphedema, not elsewhere classified L97.222 Non-pressure chronic ulcer of left calf with fat l 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 Modifier: ayer exposed layer exposed Quantity: 1 Electronic Signature(s) Signed: 01/10/2017 9:15:21 AM By: Christin Fudge MD, FACS Entered By: Christin Fudge on 01/10/2017 09:15:21

## 2017-01-11 NOTE — Progress Notes (Signed)
Brooke Hopkins, Brooke Hopkins ([id number]) Visit Report for 01/10/2017 Arrival Information Details Patient Name: Brooke Hopkins, Brooke Hopkins. Date of Service: 01/10/2017 8:45 AM Medical Record Number: [id number] Patient Account Number: [id number] Date of Birth/Sex: 22-Apr-1951 (66 y.o. Female) Treating RN: Ahmed Prima Primary Care Physician: FITZGERALD, DAVID Other Clinician: Referring Physician: FITZGERALD, DAVID Treating Physician/Extender: Frann Rider in Treatment: 30 Visit Information History Since Last Visit All ordered tests and consults were completed: No Patient Arrived: Wheel Chair Added or deleted any medications: No Arrival Time: 08:39 Any new allergies or adverse reactions: No Accompanied By: son Had a fall or experienced change in No Transfer Assistance: [employer] activities of daily living that may affect Patient Lift risk of falls: Patient Identification Verified: Yes Signs or symptoms of abuse/neglect since last No Secondary Verification Process Yes visito Completed: Hospitalized since last visit: No Patient Requires Transmission- No Has Dressing in Place as Prescribed: Yes Based Precautions: Has Compression in Place as Prescribed: Yes Patient Has Alerts: No Pain Present Now: No Electronic Signature(s) Signed: 01/10/2017 4:51:47 PM By: Alric Quan Entered By: Alric Quan on 01/10/2017 08:40:31 [name] ([id number]) -------------------------------------------------------------------------------- Encounter Discharge Information Details Patient Name: Brooke Hopkins, Brooke Hopkins. Date of Service: 01/10/2017 8:45 AM Medical Record Number: [id number] Patient Account Number: [id number] Date of Birth/Sex: 1951/11/19 (66 y.o. Female) Treating RN: Ahmed Prima Primary Care Physician: FITZGERALD, DAVID Other Clinician: Referring Physician: FITZGERALD, DAVID Treating Physician/Extender: Frann Rider in Treatment: 22 Encounter Discharge Information  Items Discharge Pain Level: 0 Discharge Condition: Stable Ambulatory Status: Wheelchair Discharge Destination: Home Transportation: Private Auto Accompanied By: son Schedule Follow-up Appointment: Yes Medication Reconciliation completed and provided to Patient/Care Yes [name]: Provided on Clinical Summary of Care: 01/10/2017 Form Type Recipient Paper Patient LB Electronic Signature(s) Signed: 01/10/2017 9:35:59 AM By: Ruthine Dose Entered By: Ruthine Dose on 01/10/2017 09:35:59 [name] ([id number]) -------------------------------------------------------------------------------- Lower Extremity Assessment Details Patient Name: Brooke Hopkins, Brooke Hopkins. Date of Service: 01/10/2017 8:45 AM Medical Record Number: [id number] Patient Account Number: [id number] Date of Birth/Sex: 1951/03/23 (66 y.o. Female) Treating RN: Ahmed Prima Primary Care Physician: FITZGERALD, DAVID Other Clinician: Referring Physician: FITZGERALD, DAVID Treating Physician/Extender: Frann Rider in Treatment: 29 Edema Assessment Assessed: [Left: No] [Right: No] E[Left: dema] [Right: :] Calf Left: Right: Point of Measurement: 32 cm From Medial Instep 54.2 cm 60.5 cm Ankle Left: Right: Point of Measurement: 11 cm From Medial Instep 34.5 cm 33.4 cm Vascular Assessment Pulses: Dorsalis Pedis Palpable: [Left:Yes] [Right:Yes] Posterior Tibial Extremity colors, hair growth, and conditions: Extremity Color: [Left:Red] [Right:Red] Temperature of Extremity: [Left:Warm] [Right:Warm] Capillary Refill: [Left:< 3 seconds] [Right:< 3 seconds] Toe Nail Assessment Left: Right: Thick: Yes Yes Discolored: Yes Yes Deformed: No No Improper Length and Hygiene: Yes Yes Electronic Signature(s) Signed: 01/10/2017 4:51:47 PM By: Alric Quan Entered By: Alric Quan on 01/10/2017 08:58:45 [name]  ([id number]) -------------------------------------------------------------------------------- Multi Wound Chart Details Patient Name: Brooke Hopkins, Brooke Hopkins. Date of Service: 01/10/2017 8:45 AM Medical Record Number: [id number] Patient Account Number: [id number] Date of Birth/Sex: [birth year] (66 y.o. Female) Treating RN: Ahmed Prima Primary Care Physician: FITZGERALD, DAVID Other Clinician: Referring Physician: FITZGERALD, DAVID Treating Physician/Extender: Frann Rider in Treatment: 29 Vital Signs Height(in): 67 Pulse(bpm): 75 Weight(lbs): 300 Blood Pressure 130/100 (mmHg): Body Mass Index(BMI): 47 Temperature(F): 98.2 Respiratory Rate 18 (breaths/min): Photos: [1:No Photos] [2:No Photos] [N/A:N/A] Wound Location: [1:Left Lower Leg - Posterior Right Lower Leg -] [2:Posterior] [N/A:N/A] Wounding Event: [1:Gradually Appeared] [2:Gradually Appeared] [N/A:N/A] Primary Etiology: [1:Lymphedema] [2:Lymphedema] [N/A:N/A] Date Acquired: [1:12/31/2015] [2:12/31/2015] [N/A:N/A] Weeks of Treatment: [1:29] [  2:29] [N/A:N/A] Wound Status: [1:Open] [2:Open] [N/A:N/A] Clustered Wound: [1:No] [2:Yes] [N/A:N/A] Clustered Quantity: [1:N/A] [2:3] [N/A:N/A] Measurements L x W x D 1.2x2x0.2 [2:4x1x0.2] [N/A:N/A] (cm) Area (cm) : [1:1.885] [2:3.142] [N/A:N/A] Volume (cm) : [1:0.377] [2:0.628] [N/A:N/A] % Reduction in Area: [1:98.10%] [2:98.60%] [N/A:N/A] % Reduction in Volume: 98.10% [2:98.60%] [N/A:N/A] Classification: [1:Partial Thickness] [2:Partial Thickness] [N/A:N/A] Exudate Amount: [1:Large] [2:Large] [N/A:N/A] Exudate Type: [1:Serous] [2:Serous] [N/A:N/A] Exudate Color: [1:amber] [2:amber] [N/A:N/A] Foul Odor After [1:Yes] [2:Yes] [N/A:N/A] Cleansing: Odor Anticipated Due to No [2:No] [N/A:N/A] Product Use: Wound Margin: [1:Flat and Intact] [2:Indistinct, nonvisible] [N/A:N/A] Granulation Amount: [1:None Present (0%)] [2:None Present (0%)] [N/A:N/A] Necrotic Amount: [1:Large  (67-100%)] [2:Large (67-100%)] [N/A:N/A] Exposed Structures: [1:Fascia: No Fat: No Tendon: No] [2:Fascia: No Fat: No Tendon: No] [N/A:N/A] Muscle: No Muscle: No Joint: No Joint: No Bone: No Bone: No Limited to Skin Limited to Skin Breakdown Breakdown Epithelialization: Medium (34-66%) Small (1-33%) N/A Debridement: Debridement (61443- Debridement (15400- N/A 11047) 11047) Pre-procedure 09:06 09:06 N/A Verification/Time Out Taken: Pain Control: Lidocaine 4% Topical Lidocaine 4% Topical N/A Solution Solution Tissue Debrided: Fibrin/Slough, Exudates, Fibrin/Slough, Exudates, N/A Subcutaneous Subcutaneous Level: Skin/Subcutaneous Skin/Subcutaneous N/A Tissue Tissue Debridement Area (sq 2.4 4 N/A cm): Instrument: Curette Curette N/A Bleeding: Minimum Minimum N/A Hemostasis Achieved: Pressure Pressure N/A Procedural Pain: 0 0 N/A Post Procedural Pain: 0 0 N/A Debridement Treatment Procedure was tolerated Procedure was tolerated N/A Response: well well Post Debridement 1.2x2x0.2 4x1x0.2 N/A Measurements L x W x D (cm) Post Debridement 0.377 0.628 N/A Volume: (cm) Periwound Skin Texture: Edema: Yes Edema: Yes N/A Excoriation: Yes Excoriation: Yes Induration: No Induration: No Callus: No Callus: No Crepitus: No Crepitus: No Fluctuance: No Fluctuance: No Friable: No Friable: No Rash: No Rash: No Scarring: No Scarring: No Periwound Skin Maceration: Yes Maceration: Yes N/A Moisture: Moist: Yes Moist: Yes Dry/Scaly: No Dry/Scaly: No Periwound Skin Color: Rubor: Yes Erythema: Yes N/A Atrophie Blanche: No Hemosiderin Staining: Yes Cyanosis: No Rubor: Yes Ecchymosis: No Atrophie Blanche: No Erythema: No Cyanosis: No Hemosiderin Staining: No Ecchymosis: No TELESHA, DEGUZMAN ([id number]) Mottled: No Mottled: No Pallor: No Pallor: No Erythema Location: N/A Circumferential N/A Temperature: No Abnormality No Abnormality N/A Tenderness on Yes Yes  N/A Palpation: Wound Preparation: Ulcer Cleansing: Other: Ulcer Cleansing: Other: N/A soap and water soap and water Topical Anesthetic Topical Anesthetic Applied: Other: lidocaine Applied: Other: lidocaine 4% 4% Procedures Performed: Debridement Debridement N/A Treatment Notes Wound #1 (Left, Posterior Lower Leg) 1. Cleansed with: Clean wound with Normal Saline Cleanse wound with antibacterial soap and water 2. Anesthetic Topical Lidocaine 4% cream to wound bed prior to debridement 4. Dressing Applied: Santyl Ointment 5. Secondary Dressing Applied ABD Pad Dry Gauze 7. Secured with Tape 3 Layer Compression System - Bilateral Notes unna to anchor Wound #2 (Right, Posterior Lower Leg) 1. Cleansed with: Clean wound with Normal Saline Cleanse wound with antibacterial soap and water 2. Anesthetic Topical Lidocaine 4% cream to wound bed prior to debridement 4. Dressing Applied: Santyl Ointment 5. Secondary Dressing Applied ABD Pad Dry Gauze 7. Secured with Tape 3 Layer Compression System - Bilateral Notes KENNEDEE, KITZMILLER ([id number]) Louretta [place] to anchor Electronic Signature(s) Signed: 01/10/2017 9:12:25 AM By: Christin Fudge MD, FACS Entered By: Christin Fudge on 01/10/2017 09:12:25 [name] ([id number]) -------------------------------------------------------------------------------- [place] Details Patient Name: Brooke Hopkins, Brooke Hopkins. Date of Service: 01/10/2017 8:45 AM Medical Record Number: [id number] Patient Account Number: [id number] Date of Birth/Sex: [birth year] (66 y.o. Female) Treating RN: Ahmed Prima Primary Care Physician: FITZGERALD, DAVID Other Clinician: Referring  Physician: FITZGERALD, DAVID Treating Physician/Extender: Frann Rider in Treatment: 81 Active Inactive Orientation to the Wound Care Program Nursing Diagnoses: Knowledge deficit related to the wound healing center program Goals: Patient/caregiver will  verbalize understanding of the [place] Program Date Initiated: 06/21/2016 Goal Status: Active Interventions: Provide education on orientation to the wound center Notes: Wound/Skin Impairment Nursing Diagnoses: Impaired tissue integrity Goals: Patient/caregiver will verbalize understanding of skin care regimen Date Initiated: 06/21/2016 Goal Status: Active Ulcer/skin breakdown will have a volume reduction of 30% by week 4 Date Initiated: 06/21/2016 Goal Status: Active Ulcer/skin breakdown will have a volume reduction of 50% by week 8 Date Initiated: 06/21/2016 Goal Status: Active Ulcer/skin breakdown will have a volume reduction of 80% by week 12 Date Initiated: 06/21/2016 Goal Status: Active Ulcer/skin breakdown will heal within 14 weeks Date Initiated: 06/21/2016 Goal Status: Active Brooke Hopkins, Brooke Hopkins ([id number]) Interventions: Assess patient/caregiver ability to obtain necessary supplies Assess patient/caregiver ability to perform ulcer/skin care regimen upon admission and as needed Assess ulceration(s) every visit Provide education on ulcer and skin care Notes: Electronic Signature(s) Signed: 01/10/2017 4:51:47 PM By: Alric Quan Entered By: Alric Quan on 01/10/2017 08:58:51 [name] ([id number]) -------------------------------------------------------------------------------- Pain Assessment Details Patient Name: Brooke Hopkins, Brooke Hopkins. Date of Service: 01/10/2017 8:45 AM Medical Record Number: [id number] Patient Account Number: [id number] Date of Birth/Sex: 04-13-1951 (66 y.o. Female) Treating RN: Ahmed Prima Primary Care Physician: FITZGERALD, DAVID Other Clinician: Referring Physician: FITZGERALD, DAVID Treating Physician/Extender: Frann Rider in Treatment: 29 Active Problems Location of Pain Severity and Description of Pain Patient Has Paino No Site Locations With Dressing Change: No Pain Management and Medication Current  Pain Management: Electronic Signature(s) Signed: 01/10/2017 4:51:47 PM By: Alric Quan Entered By: Alric Quan on 01/10/2017 08:40:39 [name] ([id number]) -------------------------------------------------------------------------------- Patient/Caregiver Education Details Patient Name: Brooke Hopkins, Brooke Hopkins. Date of Service: 01/10/2017 8:45 AM Medical Record Number: [id number] Patient Account Number: [id number] Date of Birth/Gender: 01/19/1951 (66 y.o. Female) Treating RN: Ahmed Prima Primary Care Physician: FITZGERALD, DAVID Other Clinician: Referring Physician: FITZGERALD, DAVID Treating Physician/Extender: Frann Rider in Treatment: 17 Education Assessment Education Provided To: Patient Education Topics Provided Wound/Skin Impairment: Handouts: Other: change dressing as ordered and do not get dressing wet Methods: Demonstration, Explain/Verbal Responses: State content correctly Electronic Signature(s) Signed: 01/10/2017 4:51:47 PM By: Alric Quan Entered By: Alric Quan on 01/10/2017 09:02:30 [name] ([id number]) -------------------------------------------------------------------------------- Wound Assessment Details Patient Name: Brooke Hopkins, Brooke Hopkins. Date of Service: 01/10/2017 8:45 AM Medical Record Number: [id number] Patient Account Number: [id number] Date of Birth/Sex: [birth year] (66 y.o. Female) Treating RN: Ahmed Prima Primary Care Physician: FITZGERALD, DAVID Other Clinician: Referring Physician: FITZGERALD, DAVID Treating Physician/Extender: Frann Rider in Treatment: 29 Wound Status Wound Number: 1 Primary Etiology: Lymphedema Wound Location: Left Lower Leg - Posterior Wound Status: Open Wounding Event: Gradually Appeared Date Acquired: 12/31/2015 Weeks Of Treatment: 29 Clustered Wound: No Photos Photo Uploaded By: Alric Quan on 01/10/2017 09:44:08 Wound Measurements Length: (cm) 1.2 Width: (cm)  2 Depth: (cm) 0.2 Area: (cm) 1.885 Volume: (cm) 0.377 % Reduction in Area: 98.1% % Reduction in Volume: 98.1% Epithelialization: Medium (34-66%) Tunneling: No Undermining: No Wound Description Classification: Partial Thickness Wound Margin: Flat and Intact Exudate Amount: Large Exudate Type: Serous Exudate Color: amber Foul Odor After Cleansing: Yes Due to Product Use: No Wound Bed Granulation Amount: None Present (0%) Exposed Structure Necrotic Amount: Large (67-100%) Fascia Exposed: No Necrotic Quality: Adherent Slough Fat Layer Exposed: No Tendon Exposed: No Brooke Hopkins, Brooke Hopkins. ([id number]) Muscle Exposed: No  Joint Exposed: No Bone Exposed: No Limited to Skin Breakdown Periwound Skin Texture Texture Color No Abnormalities Noted: No No Abnormalities Noted: No Callus: No Atrophie Blanche: No Crepitus: No Cyanosis: No Excoriation: Yes Ecchymosis: No Fluctuance: No Erythema: No Friable: No Hemosiderin Staining: No Induration: No Mottled: No Localized Edema: Yes Pallor: No Rash: No Rubor: Yes Scarring: No Temperature / Pain Moisture Temperature: No Abnormality No Abnormalities Noted: No Tenderness on Palpation: Yes Dry / Scaly: No Maceration: Yes Moist: Yes Wound Preparation Ulcer Cleansing: Other: soap and water, Topical Anesthetic Applied: Other: lidocaine 4%, Treatment Notes Wound #1 (Left, Posterior Lower Leg) 1. Cleansed with: Clean wound with Normal Saline Cleanse wound with antibacterial soap and water 2. Anesthetic Topical Lidocaine 4% cream to wound bed prior to debridement 4. Dressing Applied: Santyl Ointment 5. Secondary Dressing Applied ABD Pad Dry Gauze 7. Secured with Tape 3 Layer Compression System - Bilateral Notes unna to anchor Electronic Signature(s) Signed: 01/10/2017 4:51:47 PM By: Tedra Coupe ([id number]) Entered By: Alric Quan on 01/10/2017 08:57:24 [name]  ([id number]) -------------------------------------------------------------------------------- Wound Assessment Details Patient Name: Brooke Hopkins, Brooke Hopkins. Date of Service: 01/10/2017 8:45 AM Medical Record Number: [id number] Patient Account Number: [id number] Date of Birth/Sex: [birth year] (66 y.o. Female) Treating RN: Ahmed Prima Primary Care Physician: FITZGERALD, DAVID Other Clinician: Referring Physician: FITZGERALD, DAVID Treating Physician/Extender: Frann Rider in Treatment: 29 Wound Status Wound Number: 2 Primary Etiology: Lymphedema Wound Location: Right Lower Leg - Posterior Wound Status: Open Wounding Event: Gradually Appeared Date Acquired: 12/31/2015 Weeks Of Treatment: 29 Clustered Wound: Yes Photos Photo Uploaded By: Alric Quan on 01/10/2017 09:44:09 Wound Measurements Length: (cm) 4 Width: (cm) 1 Depth: (cm) 0.2 Clustered Quantity: 3 Area: (cm) 3.142 Volume: (cm) 0.628 % Reduction in Area: 98.6% % Reduction in Volume: 98.6% Epithelialization: Small (1-33%) Tunneling: No Undermining: No Wound Description Classification: Partial Thickness Wound Margin: Indistinct, nonvisible Exudate Amount: Large Exudate Type: Serous Exudate Color: amber Foul Odor After Cleansing: Yes Due to Product Use: No Wound Bed Granulation Amount: None Present (0%) Exposed Structure Necrotic Amount: Large (67-100%) Fascia Exposed: No Necrotic Quality: Adherent Slough Fat Layer Exposed: No TESHARA, MOREE ([id number]) Tendon Exposed: No Muscle Exposed: No Joint Exposed: No Bone Exposed: No Limited to Skin Breakdown Periwound Skin Texture Texture Color No Abnormalities Noted: No No Abnormalities Noted: No Callus: No Atrophie Blanche: No Crepitus: No Cyanosis: No Excoriation: Yes Ecchymosis: No Fluctuance: No Erythema: Yes Friable: No Erythema Location: Circumferential Induration: No Hemosiderin Staining: Yes Localized Edema: Yes Mottled:  No Rash: No Pallor: No Scarring: No Rubor: Yes Moisture Temperature / Pain No Abnormalities Noted: No Temperature: No Abnormality Dry / Scaly: No Tenderness on Palpation: Yes Maceration: Yes Moist: Yes Wound Preparation Ulcer Cleansing: Other: soap and water, Topical Anesthetic Applied: Other: lidocaine 4%, Treatment Notes Wound #2 (Right, Posterior Lower Leg) 1. Cleansed with: Clean wound with Normal Saline Cleanse wound with antibacterial soap and water 2. Anesthetic Topical Lidocaine 4% cream to wound bed prior to debridement 4. Dressing Applied: Santyl Ointment 5. Secondary Dressing Applied ABD Pad Dry Gauze 7. Secured with Tape 3 Layer Compression System - Bilateral Notes unna to anchor ANNEMARIE, SEBREE ([id number]) Electronic Signature(s) Signed: 01/10/2017 4:51:47 PM By: Alric Quan Entered By: Alric Quan on 01/10/2017 08:56:28 [name] ([id number]) -------------------------------------------------------------------------------- Vitals Details Patient Name: TASHAY, BOZICH. Date of Service: 01/10/2017 8:45 AM Medical Record Number: [id number] Patient Account Number: [id number] Date of Birth/Sex: [birth year] (66 y.o. Female) Treating RN: Ahmed Prima Primary Care Physician:  FITZGERALD, DAVID Other Clinician: Referring Physician: FITZGERALD, DAVID Treating Physician/Extender: Frann Rider in Treatment: 29 Vital Signs Time Taken: 08:41 Temperature (F): 98.2 Height (in): 67 Pulse (bpm): 75 Weight (lbs): 300 Respiratory Rate (breaths/min): 18 Body Mass Index (BMI): 47 Blood Pressure (mmHg): 130/100 Reference Range: 80 - 120 mg / dl Electronic Signature(s) Signed: 01/10/2017 4:51:47 PM By: Alric Quan Entered By: Alric Quan on 01/10/2017 08:44:16

## 2017-01-17 ENCOUNTER — Ambulatory Visit: Payer: 59 | Admitting: Surgery

## 2017-01-24 ENCOUNTER — Encounter: Payer: 59 | Admitting: Surgery

## 2017-01-24 DIAGNOSIS — L97212 Non-pressure chronic ulcer of right calf with fat layer exposed: Secondary | ICD-10-CM | POA: Diagnosis not present

## 2017-01-24 DIAGNOSIS — I890 Lymphedema, not elsewhere classified: Secondary | ICD-10-CM | POA: Diagnosis not present

## 2017-01-24 DIAGNOSIS — L89610 Pressure ulcer of right heel, unstageable: Secondary | ICD-10-CM | POA: Diagnosis not present

## 2017-01-24 DIAGNOSIS — E11622 Type 2 diabetes mellitus with other skin ulcer: Secondary | ICD-10-CM | POA: Diagnosis not present

## 2017-01-24 DIAGNOSIS — Z87891 Personal history of nicotine dependence: Secondary | ICD-10-CM | POA: Diagnosis not present

## 2017-01-24 DIAGNOSIS — L97222 Non-pressure chronic ulcer of left calf with fat layer exposed: Secondary | ICD-10-CM | POA: Diagnosis not present

## 2017-01-24 NOTE — Progress Notes (Addendum)
HURLEY, BLEVINS ([id number]) Visit Report for 01/24/2017 Chief Complaint Document Details Patient Name: Brooke Hopkins, Brooke Hopkins. Date of Service: 01/24/2017 8:45 AM Medical Record Number: [id number] Patient Account Number: [id number] Date of Birth/Sex: 1951/04/25 (66 y.o. Female) Treating RN: Primary Care Provider: FITZGERALD, DAVID Other Clinician: Referring Provider: FITZGERALD, DAVID Treating Provider/Extender: Frann Rider in Treatment: 31 Information Obtained from: Patient Chief Complaint Brooke Hopkins returns for follow-up to her bilateral lower extremity ulcers Electronic Signature(s) Signed: 01/24/2017 9:06:13 AM By: Christin Fudge MD, FACS Entered By: Christin Fudge on 01/24/2017 09:06:13 Brooke Hopkins ([id number]) -------------------------------------------------------------------------------- Debridement Details Patient Name: Brooke Hopkins, Brooke Hopkins. Date of Service: 01/24/2017 8:45 AM Medical Record Number: [id number] Patient Account Number: [id number] Date of Birth/Sex: 09/14/1951 (66 y.o. Female) Treating RN: Primary Care Provider: FITZGERALD, DAVID Other Clinician: Referring Provider: FITZGERALD, DAVID Treating Provider/Extender: Frann Rider in Treatment: 31 Debridement Performed for Wound #1 Left,Posterior Lower Leg Assessment: Performed By: Physician Christin Fudge, MD Debridement: Debridement Pre-procedure Yes - 08:55 Verification/Time Out Taken: Start Time: 08:56 Pain Control: Lidocaine 4% Topical Solution Level: Skin/Subcutaneous Tissue Total Area Debrided (L x 3.7 (cm) x 1 (cm) = 3.7 (cm) W): Tissue and other Viable, Non-Viable, Exudate, Fibrin/Slough, Subcutaneous material debrided: Instrument: Curette Bleeding: Minimum Hemostasis Achieved: Pressure End Time: 08:58 Procedural Pain: 0 Post Procedural Pain: 0 Response to Treatment: Procedure was tolerated well Post Debridement Measurements of Total Wound Length: (cm) 3.7 Width: (cm)  1 Depth: (cm) 0.2 Volume: (cm) 0.581 Character of Wound/Ulcer Post Requires Further Debridement Debridement: Severity of Tissue Post Debridement: Fat layer exposed Post Procedure Diagnosis Same as Pre-procedure Electronic Signature(s) Signed: 01/24/2017 9:05:59 AM By: Christin Fudge MD, FACS Entered By: Christin Fudge on 01/24/2017 09:05:59 Brooke Hopkins ([id number]) -------------------------------------------------------------------------------- Debridement Details Patient Name: Brooke Hopkins. Date of Service: 01/24/2017 8:45 AM Medical Record Number: [id number] Patient Account Number: [id number] Date of Birth/Sex: 1951/11/26 (66 y.o. Female) Treating RN: Primary Care Provider: FITZGERALD, DAVID Other Clinician: Referring Provider: FITZGERALD, DAVID Treating Provider/Extender: Frann Rider in Treatment: 31 Debridement Performed for Wound #2 Right,Posterior Lower Leg Assessment: Performed By: Physician Christin Fudge, MD Debridement: Debridement Pre-procedure Yes - 08:55 Verification/Time Out Taken: Start Time: 08:58 Pain Control: Lidocaine 4% Topical Solution Level: Skin/Subcutaneous Tissue Total Area Debrided (L x 1 (cm) x 2 (cm) = 2 (cm) W): Tissue and other Viable, Non-Viable, Exudate, Fibrin/Slough, Subcutaneous material debrided: Instrument: Curette Bleeding: Minimum Hemostasis Achieved: Pressure End Time: 09:00 Procedural Pain: 0 Post Procedural Pain: 0 Response to Treatment: Procedure was tolerated well Post Debridement Measurements of Total Wound Length: (cm) 1 Width: (cm) 2 Depth: (cm) 0.4 Volume: (cm) 0.628 Character of Wound/Ulcer Post Requires Further Debridement Debridement: Severity of Tissue Post Debridement: Fat layer exposed Post Procedure Diagnosis Same as Pre-procedure Electronic Signature(s) Signed: 01/24/2017 9:06:05 AM By: Christin Fudge MD, FACS Entered By: Christin Fudge on 01/24/2017 09:06:05 Brooke Hopkins  ([id number]) -------------------------------------------------------------------------------- HPI Details Patient Name: Brooke Hopkins, Brooke Hopkins. Date of Service: 01/24/2017 8:45 AM Medical Record Number: [id number] Patient Account Number: [id number] Date of Birth/Sex: 10-15-1951 (66 y.o. Female) (66 y.o. Female) Treating RN: Primary Care Provider: FITZGERALD, DAVID Other Clinician: Referring Provider: FITZGERALD, DAVID Treating Provider/Extender: Frann Rider in Treatment: 31 History of Present Illness Location: massive swelling of both lower extremities and ulceration both lower extremities Quality: Patient reports experiencing a dull pain to affected area(s). Severity: Patient states wound are getting worse. Duration: Patient has had the wound for >2 years prior to seeking treatment at the wound center Timing: Pain in wound is constant (hurts all  the time) Context: The wound appeared gradually over time Modifying Factors: Other treatment(s) tried include:she has a lymphedema pump but uses it seldom and she's had several course of antibiotics Associated Signs and Symptoms: Patient reports having difficulty standing for long periods. HPI Description: 66 year old patient seen by Dr. Ola Spurr of infectious disease who has been following up for left lower extremity cellulitis and ulcer with recurrent bilateral lower extremity problems for several months. Recently she had a large right lower extremity bullae which opened out and has been ulcerated. She has seen the vascular group and has been getting Unna's wraps and has a lymphedema pump used in the past. Her prior cultures were positive for Pseudomonas, Proteus and was treated with amoxicillin. He has also been treated with 2 weeks course of ciprofloxacin and amoxicillin.. Increase of Lasix dose helped with the edema and echo showed no systolic CHF but may be diastolic problems. Past medical history significant for diabetes mellitus type 2, venous stasis  ulcer, obesity, diabetic peripheral neuropathy, status post back surgery, cholecystectomy, hysterectomy, arthroscopy of the knee. He is a former smoker and quit smoking in 1984. The patient has seen Dr. Delana Meyer who did not recommend any arterial or venous duplex studies and has been using Unna's wraps and also recommended a lymphedema pump. she has been very noncompliant with using these. 06/28/2016 -- the patient is still on antibiotics as prescribed by Dr. Ola Spurr and he is asked her to take it for 3 weeks. The patient also says she has a lot of redness and pain in the folds of her thigh and lower extremity and this is very painful. 07/26/2016 -- the patient is off antibiotics and has been getting dressing changes 3 times a week. 08/09/2016 -- is been on Cipro and is taking potassium supplements along with her Lasix and is going to be seeing Dr. Ola Spurr for a consult return visit only in November 2017. 08/23/2016 -- she was admitted to the hospital last week and I have reviewed these reports in detail. She was seen by Dr. Ola Spurr who noted a Pseudomonas infection which was resistant to ciprofloxacin and discharged her on Ceftazidime 1 g IV every 12 hourly anyone days. The antibiotic was to be stopped on September 11 and he would see her back in the clinic. 08/30/2016 -- had a communication from Dr. Ola Spurr that he would extend her antibiotics by a week if she continued to look like cellulitis was persisting. ARCHER, VISE ([id number]) 09/13/2016 -- the PICC line is out and antibiotics have stopped. 10/04/16: returns today for f/u. reports utilizes compression pump twice daily. she is compliant with her compression therapy. she reports a new blister on the right proximal posterior calf. no systemic s/s of infection. 10/11/16: returns today for f/u. she reports adherence to her compression pumps, but there is no improvement regarding her BLE edema. there is weeping of fluid.  denies fever, chills, body aches or malaise. 10/18/2016 -- she has a significant cellulitis of her right lower extremity which was not there last week. I believe at this stage she will need the expertise of Dr. Ola Spurr to decide on antibiotic coverage. 10/25/2016 -- the patient has had cipro and ampicillin been started by Dr. Ola Spurr and he is awaiting further cultures. Overall she's been feeling better. 11/08/2016 -- Dr. Ola Spurr this week who has continued with ciprofloxacin and amoxicillin and is awaiting further cultures before deciding to place her on a PICC line. 11/15/2016 -- Dr. Blane Ohara note was reviewed and her cultures  growing Pseudomonas in both legs which is sensitive to Cipro and the other is resistant to Cipro and she would get a PICC line and start IV antibiotics -- Ceftazidime 1 g q 12 for 3 weeks. 11-29-16 Mrs. Mcwherter presents today in follow-up of her bilateral lower exterminators ulcers. She remains on IV antibiotic therapy via a PICC line per Dr. Ola Spurr; she has a follow-up appointment with him on Monday, 12/02/2016. She anticipates that the about therapy will be discontinued at that appointment. She denies any complications of nausea vomiting and/or diarrhea accompanied by this antibiotic therapy. She admits to using her lymphedema pumps twice daily with the exception of yesterday. She denies any concerns or complications with the current treatment plan. 12/06/2016 -- Dr. Ola Spurr saw her recently on 12/02/2016 and due to the fact she has had significant problems with recurrent ulceration and Pseudomonas infection he recommended 3 more weeks of antibiotics and extended it until December 25. The PICC line will be in place. Last hemoglobin A1c on November 30 was 9% 12/27/2016 -- because of the holidays the patient's diet has been higher in salt, her dressings have not been done as required and she is not used to lymphedema pumps appropriately. This has  led to her lymphedema increasing markedly. 01/24/2017 -- the patient's hospital bed is malfunctioning and hence her legs have been flat in bed and her lymphedema is increased markedly. During my examination I also noted a unstageable pressure injury to her right heel Electronic Signature(s) Signed: 01/24/2017 9:06:58 AM By: Christin Fudge MD, FACS Entered By: Christin Fudge on 01/24/2017 09:06:58 Brooke Hopkins ([id number]) -------------------------------------------------------------------------------- Physical Exam Details Patient Name: Brooke Hopkins, Brooke Hopkins. Date of Service: 01/24/2017 8:45 AM Medical Record Number: [id number] Patient Account Number: [id number] Date of Birth/Sex: 1951-03-20 (66 y.o. Female) Treating RN: Primary Care Provider: FITZGERALD, DAVID Other Clinician: Referring Provider: FITZGERALD, DAVID Treating Provider/Extender: Frann Rider in Treatment: 31 Constitutional . Pulse regular. Respirations normal and unlabored. Afebrile. . Eyes Nonicteric. Reactive to light. Ears, Nose, Mouth, and Throat Lips, teeth, and gums WNL.Marland Kitchen Moist mucosa without lesions. Neck supple and nontender. No palpable supraclavicular or cervical adenopathy. Normal sized without goiter. Respiratory WNL. No retractions.. Breath sounds WNL, No rubs, rales, rhonchi, or wheeze.. Cardiovascular Heart rhythm and rate regular, no murmur or gallop.. Pedal Pulses WNL. No clubbing, cyanosis or edema. Chest Breasts symmetical and no nipple discharge.. Breast tissue WNL, no masses, lumps, or tenderness.. Lymphatic No adneopathy. No adenopathy. No adenopathy. Musculoskeletal Adexa without tenderness or enlargement.. Digits and nails w/o clubbing, cyanosis, infection, petechiae, ischemia, or inflammatory conditions.. Integumentary (Hair, Skin) No suspicious lesions. No crepitus or fluctuance. No peri-wound warmth or erythema. No masses.Marland Kitchen Psychiatric Judgement and insight Intact.. No evidence  of depression, anxiety, or agitation.. Notes bilateral lymphedema has increased markedly and the ulcerated areas both posterior calf areas required sharp debridement with a #3 curet and bleeding controlled with pressure. She has an unstageable pressure injury to the right heel Electronic Signature(s) Signed: 01/24/2017 9:07:40 AM By: Christin Fudge MD, FACS Entered By: Christin Fudge on 01/24/2017 09:07:40 Brooke Hopkins ([id number]) -------------------------------------------------------------------------------- Physician Orders Details Patient Name: Brooke Hopkins, Brooke Hopkins. Date of Service: 01/24/2017 8:45 AM Medical Record Number: [id number] Patient Account Number: [id number] Date of Birth/Sex: September 30, 1951 (66 y.o. Female) Treating RN: Ahmed Prima Primary Care Provider: FITZGERALD, DAVID Other Clinician: Referring Provider: FITZGERALD, DAVID Treating Provider/Extender: Frann Rider in Treatment: 58 Verbal / Phone Orders: Yes Clinician: Carolyne Fiscal, Debi Read Back and Verified: Yes Diagnosis Coding Wound Cleansing Wound #  1 Left,Posterior Lower Leg o Clean wound with Normal Saline. - clinic use o Cleanse wound with mild soap and water - HHRN please scrub wounds with mild soap and water o May Shower, gently pat wound dry prior to applying new dressing. o May shower with protection. Wound #2 Right,Posterior Lower Leg o Clean wound with Normal Saline. - clinic use o Cleanse wound with mild soap and water - HHRN please scrub wounds with mild soap and water o May Shower, gently pat wound dry prior to applying new dressing. o May shower with protection. Anesthetic Wound #1 Left,Posterior Lower Leg o Topical Lidocaine 4% cream applied to wound bed prior to debridement - clinic use Wound #2 Right,Posterior Lower Leg o Topical Lidocaine 4% cream applied to wound bed prior to debridement - clinic use Primary Wound Dressing Wound #1 Left,Posterior Lower Leg o  Santyl Ointment - use on the posterior leg wounds Wound #2 Right,Posterior Lower Leg o Santyl Ointment - use on the posterior leg wounds Secondary Dressing Wound #1 Left,Posterior Lower Leg o ABD pad o Dry Gauze Wound #2 Right,Posterior Lower Leg o ABD pad o Dry Gauze Brooke Hopkins, Brooke Hopkins ([id number]) Dressing Change Frequency Wound #1 Left,Posterior Lower Leg o Change Dressing Monday, Wednesday, Friday - HHRN to change wraps Monday and Wednesday Wound #2 Right,Posterior Lower Leg o Change Dressing Monday, Wednesday, Friday - HHRN to change wraps Monday and Wednesday o Change Dressing Monday, Wednesday, Friday - HHRN to change wraps Monday and Wednesday Follow-up Appointments Wound #1 Left,Posterior Lower Leg o Return Appointment in 1 week. Wound #2 Right,Posterior Lower Leg o Return Appointment in 1 week. Edema Control Wound #1 Left,Posterior Lower Leg o 3 Layer Compression System - Bilateral - May anchor top of wrap with dome paste, but only wrap around leg one time. o Elevate legs to the level of the heart and pump ankles as often as possible o Compression Pump: Use compression pump on left lower extremity for 30 minutes, twice daily. o Compression Pump: Use compression pump on right lower extremity for 30 minutes, twice daily. Wound #2 Right,Posterior Lower Leg o 3 Layer Compression System - Bilateral - May anchor top of wrap with dome paste, but only wrap around leg one time. o Elevate legs to the level of the heart and pump ankles as often as possible o Compression Pump: Use compression pump on left lower extremity for 30 minutes, twice daily. o Compression Pump: Use compression pump on right lower extremity for 30 minutes, twice daily. Additional Orders / Instructions o Increase protein intake. Wound #2 Right,Posterior Lower Leg o Other: - Pt has a deep tissue wound on her right heel. Please use a gauze and heel cup when changing  the wraps. Please order her some. Home Health Wound #1 [place] Visits - [place] Nurse may visit PRN to address patientos wound care needs. o FACE TO FACE ENCOUNTER: MEDICARE and MEDICAID PATIENTS: I certify that this patient is under my care and that I had a face-to-face encounter that meets the physician face-to-face encounter requirements with this patient on this date. The encounter with the patient was in whole or in part for the following MEDICAL CONDITION: (primary reason for [place]) TAMSIN, NADER ([id number]) MEDICAL NECESSITY: I certify, that based on my findings, NURSING services are a medically necessary home health service. HOME BOUND STATUS: I certify that my clinical findings support that this patient is homebound (i.e., Due to illness or injury,  pt requires aid of supportive devices such as crutches, cane, wheelchairs, walkers, the use of special transportation or the assistance of another person to leave their place of residence. There is a normal inability to leave the home and doing so requires considerable and taxing effort. Other absences are for medical reasons / religious services and are infrequent or of short duration when for other reasons). o Please direct any NON-WOUND related issues/requests for orders to patient's Primary Care Physician Wound #2 [place] Visits - [place] Nurse may visit PRN to address patientos wound care needs. o FACE TO FACE ENCOUNTER: MEDICARE and MEDICAID PATIENTS: I certify that this patient is under my care and that I had a face-to-face encounter that meets the physician face-to-face encounter requirements with this patient on this date. The encounter with the patient was in whole or in part for the following MEDICAL CONDITION: (primary reason for [place]) MEDICAL NECESSITY: I certify, that based  on my findings, NURSING services are a medically necessary home health service. HOME BOUND STATUS: I certify that my clinical findings support that this patient is homebound (i.e., Due to illness or injury, pt requires aid of supportive devices such as crutches, cane, wheelchairs, walkers, the use of special transportation or the assistance of another person to leave their place of residence. There is a normal inability to leave the home and doing so requires considerable and taxing effort. Other absences are for medical reasons / religious services and are infrequent or of short duration when for other reasons). o Please direct any NON-WOUND related issues/requests for orders to patient's Primary Care Physician Electronic Signature(s) Signed: 01/24/2017 4:12:12 PM By: Christin Fudge MD, FACS Signed: 01/24/2017 4:32:35 PM By: Alric Quan Entered By: Alric Quan on 01/24/2017 09:28:54 Brooke Hopkins ([id number]) -------------------------------------------------------------------------------- Problem List Details Patient Name: Brooke Hopkins, Brooke Hopkins. Date of Service: 01/24/2017 8:45 AM Medical Record Number: [id number] Patient Account Number: [id number] Date of Birth/Sex: 09-Aug-1951 (66 y.o. Female) Treating RN: Primary Care Provider: FITZGERALD, DAVID Other Clinician: Referring Provider: FITZGERALD, DAVID Treating Provider/Extender: Frann Rider in Treatment: 31 Active Problems ICD-10 Encounter Code Description Active Date Diagnosis E11.622 Type 2 diabetes mellitus with other skin ulcer 10/25/2016 Yes I89.0 Lymphedema, not elsewhere classified 10/25/2016 Yes L97.222 Non-pressure chronic ulcer of left calf with fat layer 10/25/2016 Yes exposed L97.212 Non-pressure chronic ulcer of right calf with fat layer 10/25/2016 Yes exposed E66.01 Morbid (severe) obesity due to excess calories 10/25/2016 Yes L89.610 Pressure ulcer of right heel, unstageable 01/24/2017  Yes Inactive Problems Resolved Problems Electronic Signature(s) Signed: 01/24/2017 9:05:48 AM By: Christin Fudge MD, FACS Entered By: Christin Fudge on 01/24/2017 09:05:48 Brooke Hopkins ([id number]) -------------------------------------------------------------------------------- Progress Note Details Patient Name: Brooke Hopkins, Brooke Hopkins. Date of Service: 01/24/2017 8:45 AM Medical Record Number: [id number] Patient Account Number: [id number] Date of Birth/Sex: [birth year] (66 y.o. Female) Treating RN: Primary Care Provider: FITZGERALD, DAVID Other Clinician: Referring Provider: FITZGERALD, DAVID Treating Provider/Extender: Frann Rider in Treatment: 31 Subjective Chief Complaint Information obtained from Patient Mrs. Greenspan returns for follow-up to her bilateral lower extremity ulcers History of Present Illness (HPI) The following HPI elements were documented for the patient's wound: Location: massive swelling of both lower extremities and ulceration both lower extremities Quality: Patient reports experiencing a dull pain to affected area(s). Severity: Patient states wound are getting worse. Duration: Patient has had the wound for >2 years prior to seeking treatment at the wound center Timing: Pain in wound is constant (hurts  all the time) Context: The wound appeared gradually over time Modifying Factors: Other treatment(s) tried include:she has a lymphedema pump but uses it seldom and she's had several course of antibiotics Associated Signs and Symptoms: Patient reports having difficulty standing for long periods. 66 year old patient seen by Dr. Ola Spurr of infectious disease who has been following up for left lower extremity cellulitis and ulcer with recurrent bilateral lower extremity problems for several months. Recently she had a large right lower extremity bullae which opened out and has been ulcerated. She has seen the vascular group and has been getting Unna's wraps  and has a lymphedema pump used in the past. Her prior cultures were positive for Pseudomonas, Proteus and was treated with amoxicillin. He has also been treated with 2 weeks course of ciprofloxacin and amoxicillin.. Increase of Lasix dose helped with the edema and echo showed no systolic CHF but may be diastolic problems. Past medical history significant for diabetes mellitus type 2, venous stasis ulcer, obesity, diabetic peripheral neuropathy, status post back surgery, cholecystectomy, hysterectomy, arthroscopy of the knee. He is a former smoker and quit smoking in 1984. The patient has seen Dr. Delana Meyer who did not recommend any arterial or venous duplex studies and has been using Unna's wraps and also recommended a lymphedema pump. she has been very noncompliant with using these. 06/28/2016 -- the patient is still on antibiotics as prescribed by Dr. Ola Spurr and he is asked her to take it for 3 weeks. The patient also says she has a lot of redness and pain in the folds of her thigh and lower extremity and this is very painful. 07/26/2016 -- the patient is off antibiotics and has been getting dressing changes 3 times a week. 08/09/2016 -- is been on Cipro and is taking potassium supplements along with her Lasix and is going to be seeing Dr. Ola Spurr for a consult return visit only in November 2017. Brooke Hopkins, Brooke Hopkins ([id number]) 08/23/2016 -- she was admitted to the hospital last week and I have reviewed these reports in detail. She was seen by Dr. Ola Spurr who noted a Pseudomonas infection which was resistant to ciprofloxacin and discharged her on Ceftazidime 1 g IV every 12 hourly anyone days. The antibiotic was to be stopped on September 11 and he would see her back in the clinic. 08/30/2016 -- had a communication from Dr. Ola Spurr that he would extend her antibiotics by a week if she continued to look like cellulitis was persisting. 09/13/2016 -- the PICC line is out and  antibiotics have stopped. 10/04/16: returns today for f/u. reports utilizes compression pump twice daily. she is compliant with her compression therapy. she reports a new blister on the right proximal posterior calf. no systemic s/s of infection. 10/11/16: returns today for f/u. she reports adherence to her compression pumps, but there is no improvement regarding her BLE edema. there is weeping of fluid. denies fever, chills, body aches or malaise. 10/18/2016 -- she has a significant cellulitis of her right lower extremity which was not there last week. I believe at this stage she will need the expertise of Dr. Ola Spurr to decide on antibiotic coverage. 10/25/2016 -- the patient has had cipro and ampicillin been started by Dr. Ola Spurr and he is awaiting further cultures. Overall she's been feeling better. 11/08/2016 -- Dr. Ola Spurr this week who has continued with ciprofloxacin and amoxicillin and is awaiting further cultures before deciding to place her on a PICC line. 11/15/2016 -- Dr. Blane Ohara note was reviewed and her cultures growing  Pseudomonas in both legs which is sensitive to Cipro and the other is resistant to Cipro and she would get a PICC line and start IV antibiotics -- Ceftazidime 1 g q 12 for 3 weeks. 11-29-16 Mrs. Asa presents today in follow-up of her bilateral lower exterminators ulcers. She remains on IV antibiotic therapy via a PICC line per Dr. Ola Spurr; she has a follow-up appointment with him on Monday, 12/02/2016. She anticipates that the about therapy will be discontinued at that appointment. She denies any complications of nausea vomiting and/or diarrhea accompanied by this antibiotic therapy. She admits to using her lymphedema pumps twice daily with the exception of yesterday. She denies any concerns or complications with the current treatment plan. 12/06/2016 -- Dr. Ola Spurr saw her recently on 12/02/2016 and due to the fact she has had  significant problems with recurrent ulceration and Pseudomonas infection he recommended 3 more weeks of antibiotics and extended it until December 25. The PICC line will be in place. Last hemoglobin A1c on November 30 was 9% 12/27/2016 -- because of the holidays the patient's diet has been higher in salt, her dressings have not been done as required and she is not used to lymphedema pumps appropriately. This has led to her lymphedema increasing markedly. 01/24/2017 -- the patient's hospital bed is malfunctioning and hence her legs have been flat in bed and her lymphedema is increased markedly. During my examination I also noted a unstageable pressure injury to her right heel Objective Brooke Hopkins, Brooke Hopkins. ([id number]) Constitutional Pulse regular. Respirations normal and unlabored. Afebrile. Vitals Time Taken: 8:51 AM, Height: 67 in, Weight: 300 lbs, BMI: 47, Temperature: 98.0 F, Pulse: 85 bpm, Respiratory Rate: 18 breaths/min, Blood Pressure: 132/100 mmHg. Eyes Nonicteric. Reactive to light. Ears, Nose, Mouth, and Throat Lips, teeth, and gums WNL.Marland Kitchen Moist mucosa without lesions. Neck supple and nontender. No palpable supraclavicular or cervical adenopathy. Normal sized without goiter. Respiratory WNL. No retractions.. Breath sounds WNL, No rubs, rales, rhonchi, or wheeze.. Cardiovascular Heart rhythm and rate regular, no murmur or gallop.. Pedal Pulses WNL. No clubbing, cyanosis or edema. Chest Breasts symmetical and no nipple discharge.. Breast tissue WNL, no masses, lumps, or tenderness.. Lymphatic No adneopathy. No adenopathy. No adenopathy. Musculoskeletal Adexa without tenderness or enlargement.. Digits and nails w/o clubbing, cyanosis, infection, petechiae, ischemia, or inflammatory conditions.Marland Kitchen Psychiatric Judgement and insight Intact.. No evidence of depression, anxiety, or agitation.. General Notes: bilateral lymphedema has increased markedly and the ulcerated areas both  posterior calf areas required sharp debridement with a #3 curet and bleeding controlled with pressure. She has an unstageable pressure injury to the right heel Integumentary (Hair, Skin) No suspicious lesions. No crepitus or fluctuance. No peri-wound warmth or erythema. No masses.. Wound #1 status is Open. Original cause of wound was Gradually Appeared. The wound is located on the Left,Posterior Lower Leg. The wound measures 3.7cm length x 1cm width x 0.1cm depth; 2.906cm^2 area and 0.291cm^3 volume. The wound is limited to skin breakdown. There is no tunneling or undermining noted. There is a large amount of serous drainage noted. The wound margin is flat and intact. There is no granulation within the wound bed. There is a large (67-100%) amount of necrotic tissue within the wound bed including Adherent Slough. The periwound skin appearance exhibited: Excoriation, Maceration, Rubor. The periwound skin appearance did not exhibit: Callus, Crepitus, Induration, Rash, Scarring, Dry/Scaly, Brooke Hopkins, Brooke Hopkins. ([id number]) Atrophie Blanche, Cyanosis, Ecchymosis, Hemosiderin Staining, Mottled, Pallor, Erythema. Periwound temperature was noted as No Abnormality. The periwound has tenderness  on palpation. Wound #2 status is Open. Original cause of wound was Gradually Appeared. The wound is located on the Right,Posterior Lower Leg. The wound measures 1cm length x 2cm width x 0.4cm depth; 1.571cm^2 area and 0.628cm^3 volume. The wound is limited to skin breakdown. There is no tunneling or undermining noted. There is a large amount of serous drainage noted. The wound margin is indistinct and nonvisible. There is no granulation within the wound bed. There is a large (67-100%) amount of necrotic tissue within the wound bed including Adherent Slough. The periwound skin appearance exhibited: Excoriation, Maceration, Hemosiderin Staining, Rubor, Erythema. The periwound skin appearance did not exhibit: Callus,  Crepitus, Induration, Rash, Scarring, Dry/Scaly, Atrophie Blanche, Cyanosis, Ecchymosis, Mottled, Pallor. The surrounding wound skin color is noted with erythema which is circumferential. Periwound temperature was noted as No Abnormality. The periwound has tenderness on palpation. Other Condition(s) Patient presents with Suspected Deep Tissue Injury located on the Right heel. 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 There was a Skin/Subcutaneous Tissue Debridement ([id number]) debridement with total area of 3.7 sq cm performed by Christin Fudge, MD. with the following instrument(s): Curette to remove Viable and Non-Viable tissue/material including Exudate, Fibrin/Slough, and Subcutaneous after achieving pain control using Lidocaine 4% Topical Solution. A time out was conducted at 08:55, prior to the start of the procedure. A Minimum amount of bleeding was controlled with Pressure. The procedure was tolerated well with a pain level of 0 throughout and a pain level of 0 following the procedure. Post Debridement Measurements: 3.7cm length x 1cm width x 0.2cm depth; 0.581cm^3 volume. Character of Wound/Ulcer Post Debridement requires further debridement. Severity of Tissue Post Debridement is: Fat layer exposed. BETZABETH, DERRINGER. ([id number]) Post procedure Diagnosis Wound #1: Same as Pre-Procedure Wound #2 Wound #2 is a Lymphedema located on the Right,Posterior Lower Leg . There was a Skin/Subcutaneous Tissue Debridement ([id number]) debridement with total  area of 2 sq cm performed by Christin Fudge, MD. with the following instrument(s): Curette to remove Viable and Non-Viable tissue/material including Exudate, Fibrin/Slough, and Subcutaneous after achieving pain control using Lidocaine 4% Topical Solution. A time out was conducted at 08:55, prior to the start of the procedure. A Minimum amount of bleeding was controlled with Pressure. The procedure was tolerated well with a pain level of 0 throughout and a pain level of 0 following the procedure. Post Debridement Measurements: 1cm length x 2cm width x 0.4cm depth; 0.628cm^3 volume. Character of Wound/Ulcer Post Debridement requires further debridement. Severity of Tissue Post Debridement is: Fat layer exposed. Post procedure Diagnosis Wound #2: Same as Pre-Procedure Plan Wound Cleansing: Wound #1 Left,Posterior Lower Leg: Clean wound with Normal Saline. - clinic use Cleanse wound with mild soap and water - HHRN please scrub wounds with mild soap and water May Shower, gently pat wound dry prior to applying new dressing. May shower with protection. Wound #2 Right,Posterior Lower Leg: Clean wound with Normal Saline. - clinic use Cleanse wound with mild soap and water - HHRN please scrub wounds with mild soap and water May Shower, gently pat wound dry prior to applying new  dressing. May shower with protection. Anesthetic: Wound #1 Left,Posterior Lower Leg: Topical Lidocaine 4% cream applied to wound bed prior to debridement - clinic use Wound #2 Right,Posterior Lower Leg: Topical Lidocaine 4% cream applied to wound bed prior to debridement - clinic use Primary Wound Dressing: Wound #1 Left,Posterior Lower Leg: Santyl Ointment - use on the posterior leg wounds Wound #2 Right,Posterior Lower Leg: Santyl Ointment - use on the posterior leg wounds Secondary Dressing: Wound #1 Left,Posterior Lower Leg: ABD pad Dry Gauze Wound #2 Right,Posterior Lower Leg: ABD pad ISMELDA, WEATHERMAN  ([id number]) Dry Gauze Dressing Change Frequency: Wound #1 Left,Posterior Lower Leg: Change Dressing Monday, Wednesday, Friday - HHRN to change wraps Monday and Wednesday Wound #2 Right,Posterior Lower Leg: Change Dressing Monday, Wednesday, Friday - HHRN to change wraps Monday and Wednesday Change Dressing Monday, Wednesday, Friday - HHRN to change wraps Monday and Wednesday Follow-up Appointments: Wound #1 Left,Posterior Lower Leg: Return Appointment in 1 week. Wound #2 Right,Posterior Lower Leg: Return Appointment in 1 week. Edema Control: Wound #1 Left,Posterior Lower Leg: 3 Layer Compression System - Bilateral - May anchor top of wrap with dome paste, but only wrap around leg one time. Elevate legs to the level of the heart and pump ankles as often as possible Compression Pump: Use compression pump on left lower extremity for 30 minutes, twice daily. Compression Pump: Use compression pump on right lower extremity for 30 minutes, twice daily. Wound #2 Right,Posterior Lower Leg: 3 Layer Compression System - Bilateral - May anchor top of wrap with dome paste, but only wrap around leg one time. Elevate legs to the level of the heart and pump ankles as often as possible Compression Pump: Use compression pump on left lower extremity for 30 minutes, twice daily. Compression Pump: Use compression pump on right lower extremity for 30 minutes, twice daily. Additional Orders / Instructions: Increase protein intake. Wound #2 Right,Posterior Lower Leg: Other: - Pt has a deep tissue wound on her right heel. Please use a gauze and heel cup when changing the wraps. Please order her some. Home Health: Wound #1 Left,Posterior Lower Leg: Continue Home Health Visits - [place] Nurse may visit PRN to address patient s wound care needs. FACE TO FACE ENCOUNTER: MEDICARE and MEDICAID PATIENTS: I certify that this patient is under my care and that I had a face-to-face encounter that meets  the physician face-to-face encounter requirements with this patient on this date. The encounter with the patient was in whole or in part for the following MEDICAL CONDITION: (primary reason for [place]) MEDICAL NECESSITY: I certify, that based on my findings, NURSING services are a medically necessary home health service. HOME BOUND STATUS: I certify that my clinical findings support that this patient is homebound (i.e., Due to illness or injury, pt requires aid of supportive devices such as crutches, cane, wheelchairs, walkers, the use of special transportation or the assistance of another person to leave their place of residence. There is a normal inability to leave the home and doing so requires considerable and taxing effort. Other absences are for medical reasons / religious services and are infrequent or of short duration when for other reasons). Please direct any NON-WOUND related issues/requests for orders to patient's Primary Care Physician Wound #2 Right,Posterior Lower Leg: [place] Visits - [place] Nurse may visit PRN to address patient s wound care needs. FACE TO FACE ENCOUNTER: MEDICARE and MEDICAID PATIENTS: I certify that this patient is under my care and  that I had a face-to-face encounter that meets the physician face-to-face encounter requirements with this patient on this date. The encounter with the patient was in whole or in part for the ELLIOTTE, MARSALIS. ([id number]) following MEDICAL CONDITION: (primary reason for [place]) MEDICAL NECESSITY: I certify, that based on my findings, NURSING services are a medically necessary home health service. HOME BOUND STATUS: I certify that my clinical findings support that this patient is homebound (i.e., Due to illness or injury, pt requires aid of supportive devices such as crutches, cane, wheelchairs, walkers, the use of special transportation or the assistance of another person to leave  their place of residence. There is a normal inability to leave the home and doing so requires considerable and taxing effort. Other absences are for medical reasons / religious services and are infrequent or of short duration when for other reasons). Please direct any NON-WOUND related issues/requests for orders to patient's Primary Care Physician I have recommended: 1. silver alginate and drawtex and a 3 layer Profore. 2. She will continue with elevation and exercise and her lymphedema pumps -- I had a long serious discussion with her regarding the importance of this 3. Santyl to be applied at the posterior part of her wounds 4. changing her dressings twice a week. 5. right heel will be protected with a heel cup and again offloading techniques have been discussed in great detail 6. I have had a detailed discussion with them, as lymphedema has increased markedly and I was asked to be compliant with the appropriate dietary restriction, elevation and use of her lymphedema pumps. Electronic Signature(s) Signed: 01/30/2017 2:21:02 PM By: Gretta Cool RN, BSN, Kim RN, BSN Signed: 01/30/2017 3:11:09 PM By: Christin Fudge MD, FACS Previous Signature: 01/24/2017 4:14:32 PM Version By: Christin Fudge MD, FACS Previous Signature: 01/24/2017 9:09:29 AM Version By: Christin Fudge MD, FACS Entered By: Gretta Cool RN, BSN, Kim on 01/30/2017 14:21:02 ASLAN, MONTAGNA ([id number]) -------------------------------------------------------------------------------- SuperBill Details Patient Name: JANEANE, COZART. Date of Service: 01/24/2017 Medical Record Number: [id number] Patient Account Number: [id number] Date of Birth/Sex: Sep 24, 1951 (66 y.o. 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 E66.01 Morbid (severe) obesity due to excess calories L89.610 Pressure ulcer of right heel, unstageable Facility Procedures CPT4 Code: 66063016 Description: 01093 - DEB SUBQ TISSUE 20 SQ CM/< ICD-10 Description Diagnosis E11.622 Type 2 diabetes mellitus with other skin ulcer I89.0 Lymphedema, not elsewhere classified L97.222 Non-pressure chronic ulcer of left calf with fat l Modifier: ayer exposed Quantity: 1 Physician Procedures CPT4 Code: 2355732 Description: 99213 - WC PHYS LEVEL 3 - EST PT ICD-10 Description Diagnosis L89.610 Pressure ulcer of right heel, unstageable E11.622 Type 2 diabetes mellitus with other skin ulcer I89.0 Lymphedema, not elsewhere classified Modifier: 25 Quantity: 1 CPT4 Code: [id number] [place], LI Description: 11042 - WC PHYS SUBQ TISS 20 SQ CM ICD-10 Description Diagnosis E11.622 Type 2 diabetes mellitus with other skin ulcer I89.0 Lymphedema, not elsewhere classified L97.222 Non-pressure chronic ulcer of left calf with fat l NDA M. ([id number]) Modifier: ayer exposed Quantity: 1 Electronic Signature(s) Signed: 01/24/2017 9:09:51 AM By: Christin Fudge MD, FACS Entered By: Christin Fudge on 01/24/2017 09:09:51

## 2017-01-25 NOTE — Progress Notes (Signed)
SIBYL, MIKULA ([id number]) Visit Report for 01/24/2017 Arrival Information Details Patient Name: TATIJANA, BIERLY. Date of Service: 01/24/2017 8:45 AM Medical Record Number: [id number] Patient Account Number: [id number] Date of Birth/Sex: [birth year] (66 y.o. Female) Treating RN: Ahmed Prima Primary Care Mumin Denomme: FITZGERALD, DAVID Other Clinician: Referring Giorgio Chabot: FITZGERALD, DAVID Treating Copeland Lapier/Extender: Frann Rider in Treatment: 32 Visit Information History Since Last Visit All ordered tests and consults were completed: No Patient Arrived: Wheel Chair Added or deleted any medications: No Arrival Time: 08:35 Any new allergies or adverse reactions: No Accompanied By: son Had a fall or experienced change in No activities of daily living that may affect Transfer Assistance: None risk of falls: Patient Identification Verified: Yes Signs or symptoms of abuse/neglect since last No Secondary Verification Process Yes visito Completed: Hospitalized since last visit: No Patient Requires Transmission-Based No Has Dressing in Place as Prescribed: Yes Precautions: Has Compression in Place as Prescribed: Yes Patient Has Alerts: No Pain Present Now: No Electronic Signature(s) Signed: 01/24/2017 4:32:35 PM By: Alric Quan Entered By: Alric Quan on 01/24/2017 08:39:59 [name] ([id number]) -------------------------------------------------------------------------------- Encounter Discharge Information Details Patient Name: AUBRII, SHARPLESS. Date of Service: 01/24/2017 8:45 AM Medical Record Number: [id number] Patient Account Number: [id number] Date of Birth/Sex: 13-Jul-1951 (66 y.o. Female) Treating RN: Ahmed Prima Primary Care Ishi Danser: FITZGERALD, DAVID Other Clinician: Referring Zuriel Roskos: FITZGERALD, DAVID Treating Freddie Nghiem/Extender: Frann Rider in Treatment: 87 Encounter Discharge Information Items Discharge Pain Level:  0 Discharge Condition: Stable Ambulatory Status: Wheelchair Discharge Destination: Home Transportation: Private Auto Accompanied By: son Schedule Follow-up Appointment: Yes Medication Reconciliation completed and provided to Patient/Care No Mackay Hanauer: Provided on Clinical Summary of Care: 01/24/2017 Form Type Recipient Paper Patient LB Electronic Signature(s) Signed: 01/24/2017 9:24:47 AM By: Ruthine Dose Entered By: Ruthine Dose on 01/24/2017 09:24:47 [name] ([id number]) -------------------------------------------------------------------------------- Lower Extremity Assessment Details Patient Name: LIBNI, FUSARO. Date of Service: 01/24/2017 8:45 AM Medical Record Number: [id number] Patient Account Number: [id number] Date of Birth/Sex: [birth year] (67 y.o. Female) Treating RN: Ahmed Prima Primary Care Radford Pease: FITZGERALD, DAVID Other Clinician: Referring Dola Lunsford: FITZGERALD, DAVID Treating Perley Arthurs/Extender: Frann Rider in Treatment: 31 Edema Assessment Assessed: [Left: No] [Right: No] E[Left: dema] [Right: :] Calf Left: Right: Point of Measurement: 32 cm From Medial Instep 55.2 cm 60.8 cm Ankle Left: Right: Point of Measurement: 11 cm From Medial Instep 35.6 cm 34.2 cm Vascular Assessment Pulses: Dorsalis Pedis Palpable: [Left:Yes] [Right:Yes] Posterior Tibial Extremity colors, hair growth, and conditions: Extremity Color: [Left:Red] [Right:Red] Temperature of Extremity: [Left:Warm] [Right:Warm] Capillary Refill: [Left:< 3 seconds] [Right:< 3 seconds] Toe Nail Assessment Left: Right: Thick: Yes Yes Discolored: Yes Yes Deformed: No No Improper Length and Hygiene: Yes Yes Electronic Signature(s) Signed: 01/24/2017 4:32:35 PM By: Alric Quan Entered By: Alric Quan on 01/24/2017 09:27:33 [name] ([id number]) -------------------------------------------------------------------------------- Multi Wound Chart  Details Patient Name: [name]. Date of Service: 01/24/2017 8:45 AM Medical Record Number: [id number] Patient Account Number: [id number] Date of Birth/Sex: 1951-04-03 (66 y.o. Female) Treating RN: Ahmed Prima Primary Care Glade Strausser: FITZGERALD, DAVID Other Clinician: Referring Raschelle Wisenbaker: FITZGERALD, DAVID Treating Terrence Wishon/Extender: Frann Rider in Treatment: 31 Vital Signs Height(in): 67 Pulse(bpm): 85 Weight(lbs): 300 Blood Pressure 132/100 (mmHg): Body Mass Index(BMI): 47 Temperature(F): 98.0 Respiratory Rate 18 (breaths/min): Photos: [1:No Photos] [2:No Photos] [N/A:N/A] Wound Location: [1:Left Lower Leg - Posterior Right Lower Leg -] [2:Posterior] [N/A:N/A] Wounding Event: [1:Gradually Appeared] [2:Gradually Appeared] [N/A:N/A] Primary Etiology: [1:Lymphedema] [2:Lymphedema] [N/A:N/A] Date Acquired: [1:12/31/2015] [2:12/31/2015] [N/A:N/A] Weeks of Treatment: [1:31] [2:31] [N/A:N/A] Wound  Status: [1:Open] [2:Open] [N/A:N/A] Clustered Wound: [1:No] [2:Yes] [N/A:N/A] Clustered Quantity: [1:N/A] [2:3] [N/A:N/A] Measurements L x W x D 3.7x1x0.1 [2:1x2x0.4] [N/A:N/A] (cm) Area (cm) : [1:2.906] [2:1.571] [N/A:N/A] Volume (cm) : [1:0.291] [2:0.628] [N/A:N/A] % Reduction in Area: [1:97.10%] [2:99.30%] [N/A:N/A] % Reduction in Volume: 98.60% [2:98.60%] [N/A:N/A] Classification: [1:Partial Thickness] [2:Partial Thickness] [N/A:N/A] Exudate Amount: [1:Large] [2:Large] [N/A:N/A] Exudate Type: [1:Serous] [2:Serous] [N/A:N/A] Exudate Color: [1:amber] [2:amber] [N/A:N/A] Foul Odor After [1:Yes] [2:Yes] [N/A:N/A] Cleansing: Odor Anticipated Due to No [2:No] [N/A:N/A] Product Use: Wound Margin: [1:Flat and Intact] [2:Indistinct, nonvisible] [N/A:N/A] Granulation Amount: [1:None Present (0%)] [2:None Present (0%)] [N/A:N/A] Necrotic Amount: [1:Large (67-100%)] [2:Large (67-100%)] [N/A:N/A] Exposed Structures: [1:Fascia: No Fat Layer (Subcutaneous Fat Layer  (Subcutaneous Tissue) Exposed: No] [2:Fascia: No Tissue) Exposed: No] [N/A:N/A] Tendon: No Tendon: No Muscle: No Muscle: No Joint: No Joint: No Bone: No Bone: No Limited to Skin Limited to Skin Breakdown Breakdown Epithelialization: Medium (34-66%) Small (1-33%) N/A Debridement: Debridement (27253- Debridement (66440- N/A 11047) 11047) Pre-procedure 08:55 08:55 N/A Verification/Time Out Taken: Pain Control: Lidocaine 4% Topical Lidocaine 4% Topical N/A Solution Solution Tissue Debrided: Fibrin/Slough, Exudates, Fibrin/Slough, Exudates, N/A Subcutaneous Subcutaneous Level: Skin/Subcutaneous Skin/Subcutaneous N/A Tissue Tissue Debridement Area (sq 3.7 2 N/A cm): Instrument: Curette Curette N/A Bleeding: Minimum Minimum N/A Hemostasis Achieved: Pressure Pressure N/A Procedural Pain: 0 0 N/A Post Procedural Pain: 0 0 N/A Debridement Treatment Procedure was tolerated Procedure was tolerated N/A Response: well well Post Debridement 3.7x1x0.2 1x2x0.4 N/A Measurements L x W x D (cm) Post Debridement 0.581 0.628 N/A Volume: (cm) Periwound Skin Texture: Excoriation: Yes Excoriation: Yes N/A Induration: No Induration: No Callus: No Callus: No Crepitus: No Crepitus: No Rash: No Rash: No Scarring: No Scarring: No Periwound Skin Maceration: Yes Maceration: Yes N/A Moisture: Dry/Scaly: No Dry/Scaly: No Periwound Skin Color: Rubor: Yes Erythema: Yes N/A Atrophie Blanche: No Hemosiderin Staining: Yes Cyanosis: No Rubor: Yes Ecchymosis: No Atrophie Blanche: No Erythema: No Cyanosis: No Hemosiderin Staining: No Ecchymosis: No Mottled: No Mottled: No Pallor: No Pallor: No Erythema Location: N/A Circumferential N/A Temperature: No Abnormality No Abnormality N/A AGAPITA, SAVARINO. ([id number]) Tenderness on Yes Yes N/A Palpation: Wound Preparation: Ulcer Cleansing: Other: Ulcer Cleansing: Other: N/A soap and water soap and water Topical Anesthetic Topical  Anesthetic Applied: Other: lidocaine Applied: Other: lidocaine 4% 4% Procedures Performed: Debridement Debridement N/A Treatment Notes Electronic Signature(s) Signed: 01/24/2017 9:05:52 AM By: Christin Fudge MD, FACS Entered By: Christin Fudge on 01/24/2017 09:05:52 [name] ([id number]) -------------------------------------------------------------------------------- [place] Details Patient Name: AMELIAROSE, SHARK. Date of Service: 01/24/2017 8:45 AM Medical Record Number: [id number] Patient Account Number: [id number] Date of Birth/Sex: May 18, 1951 (66 y.o. Female) Treating RN: Ahmed Prima Primary Care Robina Hamor: FITZGERALD, DAVID Other Clinician: Referring Kylee Nardozzi: FITZGERALD, DAVID Treating Natally Ribera/Extender: Frann Rider in Treatment: 50 Active Inactive ` Orientation to the Wound Care Program Nursing Diagnoses: Knowledge deficit related to the wound healing center program Goals: Patient/caregiver will verbalize understanding of the [place] Date Initiated: 06/21/2016 Target Resolution Date: 03/29/2017 Goal Status: Active Interventions: Provide education on orientation to the wound center Notes: ` Wound/Skin Impairment Nursing Diagnoses: Impaired tissue integrity Goals: Patient/caregiver will verbalize understanding of skin care regimen Date Initiated: 06/21/2016 Target Resolution Date: 03/29/2017 Goal Status: Active Ulcer/skin breakdown will have a volume reduction of 30% by week 4 Date Initiated: 06/21/2016 Target Resolution Date: 03/29/2017 Goal Status: Active Ulcer/skin breakdown will have a volume reduction of 50% by week 8 Date Initiated: 06/21/2016 Target Resolution Date: 03/29/2017 Goal Status: Active Ulcer/skin breakdown  will have a volume reduction of 80% by week 12 Date Initiated: 06/21/2016 Target Resolution Date: 03/29/2017 Goal Status: Active Ulcer/skin breakdown will heal within 14 weeks Date  Initiated: 06/21/2016 Target Resolution Date: 03/29/2017 CANARY, FISTER ([id number]) Goal Status: Active Interventions: Assess patient/caregiver ability to obtain necessary supplies Assess patient/caregiver ability to perform ulcer/skin care regimen upon admission and as needed Assess ulceration(s) every visit Provide education on ulcer and skin care Notes: Electronic Signature(s) Signed: 01/24/2017 4:32:35 PM By: Alric Quan Entered By: Alric Quan on 01/24/2017 08:55:58 [name] ([id number]) -------------------------------------------------------------------------------- Pain Assessment Details Patient Name: TEQUISHA, MAAHS. Date of Service: 01/24/2017 8:45 AM Medical Record Number: [id number] Patient Account Number: [id number] Date of Birth/Sex: 08-11-1951 (66 y.o. Female) Treating RN: Ahmed Prima Primary Care Kyrstyn Greear: FITZGERALD, DAVID Other Clinician: Referring Eathon Valade: FITZGERALD, DAVID Treating Xcaret Morad/Extender: Frann Rider in Treatment: 31 Active Problems Location of Pain Severity and Description of Pain Patient Has Paino No Site Locations With Dressing Change: No Pain Management and Medication Current Pain Management: Electronic Signature(s) Signed: 01/24/2017 4:32:35 PM By: Alric Quan Entered By: Alric Quan on 01/24/2017 08:40:08 [name] ([id number]) -------------------------------------------------------------------------------- Patient/Caregiver Education Details Patient Name: WESTLYN, GLAZA. Date of Service: 01/24/2017 8:45 AM Medical Record Number: [id number] Patient Account Number: [id number] Date of Birth/Gender: [birth year] (66 y.o. Female) Treating RN: Ahmed Prima Primary Care Physician: FITZGERALD, DAVID Other Clinician: Referring Physician: FITZGERALD, DAVID Treating Physician/Extender: Frann Rider in Treatment: 82 Education Assessment Education Provided To: Patient Education  Topics Provided Wound/Skin Impairment: Handouts: Other: change dressings as ordered Methods: Demonstration, Explain/Verbal Responses: State content correctly Electronic Signature(s) Signed: 01/24/2017 4:32:35 PM By: Alric Quan Entered By: Alric Quan on 01/24/2017 08:54:50 [name] ([id number]) -------------------------------------------------------------------------------- Wound Assessment Details Patient Name: KLOEY, CAZAREZ. Date of Service: 01/24/2017 8:45 AM Medical Record Number: [id number] Patient Account Number: [id number] Date of Birth/Sex: May 23, 1951 (66 y.o. Female) Treating RN: Ahmed Prima Primary Care Eudell Mcphee: FITZGERALD, DAVID Other Clinician: Referring Rosmary Dionisio: FITZGERALD, DAVID Treating Chalmers Iddings/Extender: Frann Rider in Treatment: 31 Wound Status Wound Number: 1 Primary Etiology: Lymphedema Wound Location: Left Lower Leg - Posterior Wound Status: Open Wounding Event: Gradually Appeared Date Acquired: 12/31/2015 Weeks Of Treatment: 31 Clustered Wound: No Wound Measurements Length: (cm) 3.7 Width: (cm) 1 Depth: (cm) 0.1 Area: (cm) 2.906 Volume: (cm) 0.291 % Reduction in Area: 97.1% % Reduction in Volume: 98.6% Epithelialization: Medium (34-66%) Tunneling: No Undermining: No Wound Description Classification: Partial Thickness Wound Margin: Flat and Intact Exudate Amount: Large Exudate Type: Serous Exudate Color: amber Foul Odor After Cleansing: Yes Due to Product Use: No Wound Bed Granulation Amount: None Present (0%) Exposed Structure Necrotic Amount: Large (67-100%) Fascia Exposed: No Necrotic Quality: Adherent Slough Fat Layer (Subcutaneous Tissue) Exposed: No Tendon Exposed: No Muscle Exposed: No Joint Exposed: No Bone Exposed: No Limited to Skin Breakdown Periwound Skin Texture Texture Color No Abnormalities Noted: No No Abnormalities Noted: No Callus: No Atrophie Blanche: No Crepitus:  No Cyanosis: No Excoriation: Yes Ecchymosis: No Induration: No Erythema: No JASMAN, PFEIFLE ([id number]) Rash: No Hemosiderin Staining: No Scarring: No Mottled: No Pallor: No Moisture Rubor: Yes No Abnormalities Noted: No Dry / Scaly: No Temperature / Pain Maceration: Yes Temperature: No Abnormality Tenderness on Palpation: Yes Wound Preparation Ulcer Cleansing: Other: soap and water, Topical Anesthetic Applied: Other: lidocaine 4%, Treatment Notes Wound #1 (Left, Posterior Lower Leg) 1. Cleansed with: Clean wound with Normal Saline Cleanse wound with antibacterial soap and water 2. Anesthetic Topical Lidocaine 4% cream to wound bed prior to  debridement 4. Dressing Applied: Santyl Ointment 5. Secondary Dressing Applied ABD Pad Dry Gauze 7. Secured with Tape 3 Layer Compression System - Bilateral Electronic Signature(s) Signed: 01/24/2017 4:32:35 PM By: Alric Quan Entered By: Alric Quan on 01/24/2017 08:49:30 [name] ([id number]) -------------------------------------------------------------------------------- Wound Assessment Details Patient Name: PATRENA, SANTALUCIA. Date of Service: 01/24/2017 8:45 AM Medical Record Number: [id number] Patient Account Number: [id number] Date of Birth/Sex: 1951-06-12 (66 y.o. Female) Treating RN: Ahmed Prima Primary Care Rohini Jaroszewski: FITZGERALD, DAVID Other Clinician: Referring Niquan Charnley: FITZGERALD, DAVID Treating Nikitha Mode/Extender: Frann Rider in Treatment: 31 Wound Status Wound Number: 2 Primary Etiology: Lymphedema Wound Location: Right Lower Leg - Posterior Wound Status: Open Wounding Event: Gradually Appeared Date Acquired: 12/31/2015 Weeks Of Treatment: 31 Clustered Wound: Yes Wound Measurements Length: (cm) 1 Width: (cm) 2 Depth: (cm) 0.4 Clustered Quantity: 3 Area: (cm) 1.571 Volume: (cm) 0.628 % Reduction in Area: 99.3% % Reduction in Volume: 98.6% Epithelialization:  Small (1-33%) Tunneling: No Undermining: No Wound Description Classification: Partial Thickness Wound Margin: Indistinct, nonvisible Exudate Amount: Large Exudate Type: Serous Exudate Color: amber Foul Odor After Cleansing: Yes Due to Product Use: No Wound Bed Granulation Amount: None Present (0%) Exposed Structure Necrotic Amount: Large (67-100%) Fascia Exposed: No Necrotic Quality: Adherent Slough Fat Layer (Subcutaneous Tissue) Exposed: No Tendon Exposed: No Muscle Exposed: No Joint Exposed: No Bone Exposed: No Limited to Skin Breakdown Periwound Skin Texture Texture Color No Abnormalities Noted: No No Abnormalities Noted: No Callus: No Atrophie Blanche: No Crepitus: No Cyanosis: No Excoriation: Yes Ecchymosis: No SYMIAH, NOWOTNY ([id number]) Induration: No Erythema: Yes Rash: No Erythema Location: Circumferential Scarring: No Hemosiderin Staining: Yes Mottled: No Moisture Pallor: No No Abnormalities Noted: No Rubor: Yes Dry / Scaly: No Maceration: Yes Temperature / Pain Temperature: No Abnormality Tenderness on Palpation: Yes Wound Preparation Ulcer Cleansing: Other: soap and water, Topical Anesthetic Applied: Other: lidocaine 4%, Treatment Notes Wound #2 (Right, Posterior Lower Leg) 1. Cleansed with: Clean wound with Normal Saline Cleanse wound with antibacterial soap and water 2. Anesthetic Topical Lidocaine 4% cream to wound bed prior to debridement 4. Dressing Applied: Santyl Ointment 5. Secondary Dressing Applied ABD Pad Dry Gauze 7. Secured with Tape 3 Layer Compression System - Bilateral Electronic Signature(s) Signed: 01/24/2017 4:32:35 PM By: Alric Quan Entered By: Alric Quan on 01/24/2017 08:48:43 [name] ([id number]) -------------------------------------------------------------------------------- [place] Details Patient Name: SHONTELLE, MUSKA. Date of Service: 01/24/2017 8:45 AM Medical Record Number:  [id number] Patient Account Number: [id number] Date of Birth/Sex: 1951-03-05 (66 y.o. Female) Treating RN: Ahmed Prima Primary Care Tirsa Gail: FITZGERALD, DAVID Other Clinician: Referring Rima Blizzard: FITZGERALD, DAVID Treating Rjay Revolorio/Extender: Frann Rider in Treatment: 31 Vital Signs Time Taken: 08:51 Temperature (F): 98.0 Height (in): 67 Pulse (bpm): 85 Weight (lbs): 300 Respiratory Rate (breaths/min): 18 Body Mass Index (BMI): 47 Blood Pressure (mmHg): 132/100 Reference Range: 80 - 120 mg / dl Electronic Signature(s) Signed: 01/24/2017 4:32:35 PM By: Alric Quan Entered By: Alric Quan on 01/24/2017 08:51:42

## 2017-01-31 ENCOUNTER — Ambulatory Visit: Payer: 59 | Admitting: Surgery

## 2017-02-01 DIAGNOSIS — Z794 Long term (current) use of insulin: Secondary | ICD-10-CM | POA: Diagnosis not present

## 2017-02-01 DIAGNOSIS — I872 Venous insufficiency (chronic) (peripheral): Secondary | ICD-10-CM | POA: Diagnosis not present

## 2017-02-01 DIAGNOSIS — E7800 Pure hypercholesterolemia, unspecified: Secondary | ICD-10-CM | POA: Diagnosis not present

## 2017-02-01 DIAGNOSIS — I10 Essential (primary) hypertension: Secondary | ICD-10-CM | POA: Diagnosis not present

## 2017-02-01 DIAGNOSIS — E119 Type 2 diabetes mellitus without complications: Secondary | ICD-10-CM | POA: Diagnosis not present

## 2017-02-07 ENCOUNTER — Encounter: Payer: 59 | Attending: Surgery | Admitting: Surgery

## 2017-02-07 DIAGNOSIS — I890 Lymphedema, not elsewhere classified: Secondary | ICD-10-CM | POA: Insufficient documentation

## 2017-02-07 DIAGNOSIS — Z6841 Body Mass Index (BMI) 40.0 and over, adult: Secondary | ICD-10-CM | POA: Insufficient documentation

## 2017-02-07 DIAGNOSIS — Z87891 Personal history of nicotine dependence: Secondary | ICD-10-CM | POA: Insufficient documentation

## 2017-02-07 DIAGNOSIS — E11622 Type 2 diabetes mellitus with other skin ulcer: Secondary | ICD-10-CM | POA: Diagnosis not present

## 2017-02-07 DIAGNOSIS — L97222 Non-pressure chronic ulcer of left calf with fat layer exposed: Secondary | ICD-10-CM | POA: Insufficient documentation

## 2017-02-07 DIAGNOSIS — L89612 Pressure ulcer of right heel, stage 2: Secondary | ICD-10-CM | POA: Diagnosis not present

## 2017-02-07 DIAGNOSIS — I10 Essential (primary) hypertension: Secondary | ICD-10-CM | POA: Diagnosis not present

## 2017-02-07 DIAGNOSIS — L97212 Non-pressure chronic ulcer of right calf with fat layer exposed: Secondary | ICD-10-CM | POA: Insufficient documentation

## 2017-02-08 NOTE — Progress Notes (Signed)
Brooke Hopkins, Brooke Hopkins ([id number]) Visit Report for 02/07/2017 Arrival Information Details Patient Name: Brooke Hopkins, Brooke Hopkins. Date of Service: 02/07/2017 8:45 AM Medical Record Number: [id number] Patient Account Number: [id number] Date of Birth/Sex: 05/25/1951 (66 y.o. Female) Treating RN: Ahmed Prima Primary Care Amalee Olsen: FITZGERALD, DAVID Other Clinician: Referring Diyan Dave: FITZGERALD, DAVID Treating Alonzo Loving/Extender: Frann Rider in Treatment: 39 Visit Information History Since Last Visit All ordered tests and consults were completed: No Patient Arrived: Wheel Chair Added or deleted any medications: No Arrival Time: 08:43 Any new allergies or adverse reactions: No Accompanied By: son Had a fall or experienced change in No Transfer Assistance: Easy[employer] activities of daily living that may affect Patient Lift risk of falls: Patient Identification Verified: Yes Signs or symptoms of abuse/neglect since last No Secondary Verification Process Yes visito Completed: Hospitalized since last visit: No Patient Requires Transmission- No Has Dressing in Place as Prescribed: Yes Based Precautions: Has Compression in Place as Prescribed: Yes Patient Has Alerts: No Pain Present Now: No Electronic Signature(s) Signed: 02/07/2017 1:35:52 PM By: Alric Quan Entered By: Alric Quan on 02/07/2017 08:48:33 [name] ([id number]) -------------------------------------------------------------------------------- Encounter Discharge Information Details Patient Name: Brooke Hopkins, Brooke Hopkins. Date of Service: 02/07/2017 8:45 AM Medical Record Number: [id number] Patient Account Number: [id number] Date of Birth/Sex: 1951-10-06 (66 y.o. Female) Treating RN: Ahmed Prima Primary Care Shanyah Gattuso: FITZGERALD, DAVID Other Clinician: Referring Jakalyn Kratky: FITZGERALD, DAVID Treating Cormac Wint/Extender: Frann Rider in Treatment: 53 Encounter Discharge Information Items Discharge  Pain Level: 0 Discharge Condition: Stable Ambulatory Status: Wheelchair Discharge Destination: Home Transportation: Private Auto Accompanied By: son Schedule Follow-up Appointment: Yes Medication Reconciliation completed No and provided to Patient/Care Nevea Spiewak: Provided on Clinical Summary of Care: 02/07/2017 Form Type Recipient Paper Patient LB Electronic Signature(s) Signed: 02/07/2017 1:35:52 PM By: Alric Quan Previous Signature: 02/07/2017 9:51:50 AM Version By: Ruthine Dose Entered By: Alric Quan on 02/07/2017 09:56:53 [name] ([id number]) -------------------------------------------------------------------------------- Lower Extremity Assessment Details Patient Name: Brooke Hopkins, Brooke Hopkins. Date of Service: 02/07/2017 8:45 AM Medical Record Number: [id number] Patient Account Number: [id number] Date of Birth/Sex: 06-03-1951 (66 y.o. Female) Treating RN: Ahmed Prima Primary Care Ceola Para: FITZGERALD, DAVID Other Clinician: Referring Alexcia Schools: FITZGERALD, DAVID Treating Ludwig Tugwell/Extender: Frann Rider in Treatment: 33 Edema Assessment Assessed: [Left: No] [Right: No] E[Left: dema] [Right: :] Calf Left: Right: Point of Measurement: 32 cm From Medial Instep 47.6 cm 47 cm Ankle Left: Right: Point of Measurement: 11 cm From Medial Instep 26.2 cm 27.6 cm Vascular Assessment Pulses: Dorsalis Pedis Palpable: [Left:Yes] [Right:Yes] Posterior Tibial Extremity colors, hair growth, and conditions: Extremity Color: [Left:Hyperpigmented] [Right:Hyperpigmented] Temperature of Extremity: [Left:Warm] [Right:Warm] Capillary Refill: [Left:< 3 seconds] [Right:< 3 seconds] Toe Nail Assessment Left: Right: Thick: Yes Yes Discolored: Yes Yes Deformed: Yes Yes Improper Length and Hygiene: No No Notes L- HEEL TO KNEE-41.2 R- HEEL TO KNEE- 41.2 Electronic Signature(s) Signed: 02/07/2017 1:35:52 PM By: Alric Quan Entered By: Alric Quan on  02/07/2017 09:03:42 [name] ([id number]) Brooke Hopkins, Brooke M. ([id number]) -------------------------------------------------------------------------------- Multi Wound Chart Details Patient Name: Brooke Hopkins, Brooke Hopkins. Date of Service: 02/07/2017 8:45 AM Medical Record Number: [id number] Patient Account Number: [id number] Date of Birth/Sex: October 10, 1951 (66 y.o. Female) Treating RN: Ahmed Prima Primary Care Alexsis Kathman: FITZGERALD, DAVID Other Clinician: Referring Tamantha Saline: FITZGERALD, DAVID Treating Saleena Tamas/Extender: Frann Rider in Treatment: 33 Vital Signs Height(in): 67 Pulse(bpm): 75 Weight(lbs): 300 Blood Pressure 149/51 (mmHg): Body Mass Index(BMI): 47 Temperature(F): 98.0 Respiratory Rate 18 (breaths/min): Photos: [1:No Photos] [2:No Photos] [3:No Photos] Wound Location: [1:Left Lower Leg - Posterior Right Lower Leg -] [  2:Posterior] [3:Right Calcaneus] Wounding Event: [1:Gradually Appeared] [2:Gradually Appeared] [3:Pressure Injury] Primary Etiology: [1:Lymphedema] [2:Lymphedema] [3:Pressure Ulcer] Date Acquired: [1:12/31/2015] [2:12/31/2015] [3:01/24/2017] Weeks of Treatment: [1:33] [2:33] [3:0] Wound Status: [1:Open] [2:Open] [3:Open] Clustered Wound: [1:No] [2:Yes] [3:No] Clustered Quantity: [1:N/A] [2:3] [3:N/A] Measurements L x W x D 3.3x0.8x0.1 [2:1x1.7x0.2] [3:2.8x2.5x0.2] (cm) Area (cm) : [1:2.073] [2:1.335] [3:5.498] Volume (cm) : [1:0.207] [2:0.267] [3:1.1] % Reduction in Area: [1:97.90%] [2:99.40%] [3:N/A] % Reduction in Volume: 99.00% [2:99.40%] [3:N/A] Classification: [1:Partial Thickness] [2:Partial Thickness] [3:Category/Stage II] Exudate Amount: [1:Large] [2:Large] [3:Large] Exudate Type: [1:Serous] [2:Serous] [3:Serosanguineous] Exudate Color: [1:amber] [2:amber] [3:red, brown] Foul Odor After [1:Yes] [2:Yes] [3:No] Cleansing: Odor Anticipated Due to No [2:No] [3:N/A] Product Use: Wound Margin: [1:Flat and Intact] [2:Indistinct,  nonvisible] [3:Distinct, outline attached] Granulation Amount: [1:Large (67-100%)] [2:Medium (34-66%)] [3:Medium (34-66%)] Granulation Quality: [1:N/A] [2:N/A] [3:Red, Pink] Necrotic Amount: [1:Small (1-33%)] [2:Medium (34-66%)] [3:Medium (34-66%)] Necrotic Tissue: [1:Adherent Slough] [2:Adherent Slough] [3:Eschar] Exposed Structures: Fascia: No Fascia: No Fascia: No Fat Layer (Subcutaneous Fat Layer (Subcutaneous Fat Layer (Subcutaneous Tissue) Exposed: No Tissue) Exposed: No Tissue) Exposed: No Tendon: No Tendon: No Muscle: No Muscle: No Joint: No Joint: No Bone: No Bone: No Limited to Skin Limited to Skin Breakdown Breakdown Epithelialization: Medium (34-66%) Small (1-33%) None Debridement: N/A Debridement ([id number]- Debridement ([id number] 11047) 11047) Pre-procedure N/A 09:17 09:17 Verification/Time Out Taken: Pain Control: N/A Lidocaine 4% Topical Lidocaine 4% Topical Solution Solution Tissue Debrided: N/A Fibrin/Slough, Exudates, Fibrin/Slough, Exudates, Subcutaneous Subcutaneous Level: N/A Skin/Subcutaneous Skin/Subcutaneous Tissue Tissue Debridement Area (sq N/A 1.7 7 cm): Instrument: N/A Curette Forceps, Scissors Bleeding: N/A Minimum Minimum Hemostasis Achieved: N/A Pressure Pressure Procedural Pain: N/A 0 0 Post Procedural Pain: N/A 0 0 Debridement Treatment N/A Procedure was tolerated Procedure was tolerated Response: well well Post Debridement N/A 1x1.7x0.2 2.8x2.5x0.2 Measurements L x W x D (cm) Post Debridement N/A 0.267 1.1 Volume: (cm) Post Debridement N/A N/A Category/Stage II Stage: Periwound Skin Texture: Excoriation: Yes Excoriation: Yes No Abnormalities Noted Induration: No Induration: No Callus: No Callus: No Crepitus: No Crepitus: No Rash: No Rash: No Scarring: No Scarring: No Periwound Skin Maceration: Yes Maceration: Yes Maceration: Yes Moisture: Dry/Scaly: No Dry/Scaly: No Periwound Skin Color: Rubor: Yes Erythema: Yes No  Abnormalities Noted Atrophie Blanche: No Hemosiderin Staining: Yes Cyanosis: No Rubor: Yes Ecchymosis: No Atrophie Blanche: No Erythema: No Cyanosis: No Brooke Hopkins, Brooke Hopkins ([id number]) Hemosiderin Staining: No Ecchymosis: No Mottled: No Mottled: No Pallor: No Pallor: No Erythema Location: N/A Circumferential N/A Temperature: No Abnormality No Abnormality No Abnormality Tenderness on Yes Yes Yes Palpation: Wound Preparation: Ulcer Cleansing: Other: Ulcer Cleansing: Other: Ulcer Cleansing: soap and water soap and water Rinsed/Irrigated with Saline Topical Anesthetic Topical Anesthetic Applied: Other: lidocaine Applied: Other: lidocaine Topical Anesthetic 4% 4% Applied: Other: LIDOCAINE 4% Procedures Performed: N/A Debridement Debridement Treatment Notes Electronic Signature(s) Signed: 02/07/2017 9:37:46 AM By: Christin Fudge MD, FACS Entered By: Christin Fudge on 02/07/2017 09:37:46 [name] ([id number]) -------------------------------------------------------------------------------- [place] Details Patient Name: QUILLA, FREEZE. Date of Service: 02/07/2017 8:45 AM Medical Record Number: [id number] Patient Account Number: [id number] Date of Birth/Sex: 03-20-1951 (66 y.o. Female) Treating RN: Ahmed Prima Primary Care Leilene Diprima: FITZGERALD, DAVID Other Clinician: Referring Evolette Pendell: FITZGERALD, DAVID Treating Orbie Grupe/Extender: Frann Rider in Treatment: 55 Active Inactive ` Orientation to the Wound Care Program Nursing Diagnoses: Knowledge deficit related to the wound healing center program Goals: Patient/caregiver will verbalize understanding of the [place] Date Initiated: 06/21/2016 Target Resolution Date: 03/29/2017 Goal Status: Active Interventions: Provide education on orientation to  the wound center Notes: ` Wound/Skin Impairment Nursing Diagnoses: Impaired tissue  integrity Goals: Patient/caregiver will verbalize understanding of skin care regimen Date Initiated: 06/21/2016 Target Resolution Date: 03/29/2017 Goal Status: Active Ulcer/skin breakdown will have a volume reduction of 30% by week 4 Date Initiated: 06/21/2016 Target Resolution Date: 03/29/2017 Goal Status: Active Ulcer/skin breakdown will have a volume reduction of 50% by week 8 Date Initiated: 06/21/2016 Target Resolution Date: 03/29/2017 Goal Status: Active Ulcer/skin breakdown will have a volume reduction of 80% by week 12 Date Initiated: 06/21/2016 Target Resolution Date: 03/29/2017 Goal Status: Active Ulcer/skin breakdown will heal within 14 weeks Date Initiated: 06/21/2016 Target Resolution Date: 03/29/2017 Brooke Hopkins, Brooke Hopkins ([id number]) Goal Status: Active Interventions: Assess patient/caregiver ability to obtain necessary supplies Assess patient/caregiver ability to perform ulcer/skin care regimen upon admission and as needed Assess ulceration(s) every visit Provide education on ulcer and skin care Notes: Electronic Signature(s) Signed: 02/07/2017 1:35:52 PM By: Alric Quan Entered By: Alric Quan on 02/07/2017 09:04:02 [name] ([id number]) -------------------------------------------------------------------------------- Pain Assessment Details Patient Name: Brooke Hopkins, Brooke Hopkins. Date of Service: 02/07/2017 8:45 AM Medical Record Number: [id number] Patient Account Number: [id number] Date of Birth/Sex: 06-17-1951 (66 y.o. Female) Treating RN: Ahmed Prima Primary Care Kimiye Strathman: FITZGERALD, DAVID Other Clinician: Referring Thera Basden: FITZGERALD, DAVID Treating Cartez Mogle/Extender: Frann Rider in Treatment: 34 Active Problems Location of Pain Severity and Description of Pain Patient Has Paino No Site Locations With Dressing Change: No Pain Management and Medication Current Pain Management: Electronic Signature(s) Signed: 02/07/2017 1:35:52 PM By:  Alric Quan Entered By: Alric Quan on 02/07/2017 08:48:39 [name] ([id number]) -------------------------------------------------------------------------------- Patient/Caregiver Education Details Patient Name: Brooke Hopkins, Brooke Hopkins. Date of Service: 02/07/2017 8:45 AM Medical Record Number: [id number] Patient Account Number: [id number] Date of Birth/Gender: 02-15-1951 (66 y.o. Female) Treating RN: Ahmed Prima Primary Care Physician: FITZGERALD, DAVID Other Clinician: Referring Physician: FITZGERALD, DAVID Treating Physician/Extender: Frann Rider in Treatment: 43 Education Assessment Education Provided To: Patient Education Topics Provided Wound/Skin Impairment: Handouts: Other: change dressing as ordered and keep wrap dry and clean Methods: Demonstration, Explain/Verbal Responses: State content correctly Electronic Signature(s) Signed: 02/07/2017 1:35:52 PM By: Alric Quan Entered By: Alric Quan on 02/07/2017 09:07:51 [name] ([id number]) -------------------------------------------------------------------------------- Wound Assessment Details Patient Name: Brooke Hopkins, Brooke Hopkins. Date of Service: 02/07/2017 8:45 AM Medical Record Number: [id number] Patient Account Number: [id number] Date of Birth/Sex: 11-Oct-1951 (66 y.o. Female) Treating RN: Ahmed Prima Primary Care Willadeen Colantuono: FITZGERALD, DAVID Other Clinician: Referring Grantley Savage: FITZGERALD, DAVID Treating Lakyia Behe/Extender: Frann Rider in Treatment: 33 Wound Status Wound Number: 1 Primary Etiology: Lymphedema Wound Location: Left Lower Leg - Posterior Wound Status: Open Wounding Event: Gradually Appeared Date Acquired: 12/31/2015 Weeks Of Treatment: 33 Clustered Wound: No Photos Photo Uploaded By: Alric Quan on 02/07/2017 09:57:55 Wound Measurements Length: (cm) 3.3 Width: (cm) 0.8 Depth: (cm) 0.1 Area: (cm) 2.073 Volume: (cm) 0.207 % Reduction in  Area: 97.9% % Reduction in Volume: 99% Epithelialization: Medium (34-66%) Tunneling: No Undermining: No Wound Description Classification: Partial Thickness Wound Margin: Flat and Intact Exudate Amount: Large Exudate Type: Serous Exudate Color: amber Foul Odor After Cleansing: Yes Due to Product Use: No Wound Bed Granulation Amount: Large (67-100%) Exposed Structure Necrotic Amount: Small (1-33%) Fascia Exposed: No Necrotic Quality: Adherent Slough Fat Layer (Subcutaneous Tissue) Exposed: No Tendon Exposed: No Muscle Exposed: No Joint Exposed: No Bone Exposed: No Eddinger, Jory M. ([id number]) Limited to Skin Breakdown Periwound Skin Texture Texture Color No Abnormalities Noted: No No Abnormalities Noted: No Callus: No Atrophie Blanche: No Crepitus: No Cyanosis:  No Excoriation: Yes Ecchymosis: No Induration: No Erythema: No Rash: No Hemosiderin Staining: No Scarring: No Mottled: No Pallor: No Moisture Rubor: Yes No Abnormalities Noted: No Dry / Scaly: No Temperature / Pain Maceration: Yes Temperature: No Abnormality Tenderness on Palpation: Yes Wound Preparation Ulcer Cleansing: Other: soap and water, Topical Anesthetic Applied: Other: lidocaine 4%, Treatment Notes Wound #1 (Left, Posterior Lower Leg) 1. Cleansed with: Clean wound with Normal Saline Cleanse wound with antibacterial soap and water 2. Anesthetic Topical Lidocaine 4% cream to wound bed prior to debridement 4. Dressing Applied: Aquacel Ag 5. Secondary Dressing Applied ABD Pad Dry Gauze 7. Secured with Tape 3 Layer Compression System - Bilateral Notes unna to anchor Electronic Signature(s) Signed: 02/07/2017 1:35:52 PM By: Alric Quan Entered By: Alric Quan on 02/07/2017 09:00:01 [name] ([id number]) -------------------------------------------------------------------------------- Wound Assessment Details Patient Name: Brooke Hopkins, HINDERLITER. Date of Service:  02/07/2017 8:45 AM Medical Record Number: [id number] Patient Account Number: [id number] Date of Birth/Sex: [birth year] (66 y.o. Female) Treating RN: Ahmed Prima Primary Care Eugina Row: FITZGERALD, DAVID Other Clinician: Referring Jaziah Kwasnik: FITZGERALD, DAVID Treating Mylin Gignac/Extender: Frann Rider in Treatment: 33 Wound Status Wound Number: 2 Primary Etiology: Lymphedema Wound Location: Right Lower Leg - Posterior Wound Status: Open Wounding Event: Gradually Appeared Date Acquired: 12/31/2015 Weeks Of Treatment: 33 Clustered Wound: Yes Photos Photo Uploaded By: Alric Quan on 02/07/2017 09:57:56 Wound Measurements Length: (cm) 1 Width: (cm) 1.7 Depth: (cm) 0.2 Clustered Quantity: 3 Area: (cm) 1.335 Volume: (cm) 0.267 % Reduction in Area: 99.4% % Reduction in Volume: 99.4% Epithelialization: Small (1-33%) Tunneling: No Undermining: No Wound Description Classification: Partial Thickness Wound Margin: Indistinct, nonvisible Exudate Amount: Large Exudate Type: Serous Exudate Color: amber Foul Odor After Cleansing: Yes Due to Product Use: No Wound Bed Granulation Amount: Medium (34-66%) Exposed Structure Necrotic Amount: Medium (34-66%) Fascia Exposed: No Necrotic Quality: Adherent Slough Fat Layer (Subcutaneous Tissue) Exposed: No Tendon Exposed: No Muscle Exposed: No Joint Exposed: No JOLEEN, STUCKERT ([id number]) Bone Exposed: No Limited to Skin Breakdown Periwound Skin Texture Texture Color No Abnormalities Noted: No No Abnormalities Noted: No Callus: No Atrophie Blanche: No Crepitus: No Cyanosis: No Excoriation: Yes Ecchymosis: No Induration: No Erythema: Yes Rash: No Erythema Location: Circumferential Scarring: No Hemosiderin Staining: Yes Mottled: No Moisture Pallor: No No Abnormalities Noted: No Rubor: Yes Dry / Scaly: No Maceration: Yes Temperature / Pain Temperature: No Abnormality Tenderness on Palpation: Yes Wound  Preparation Ulcer Cleansing: Other: soap and water, Topical Anesthetic Applied: Other: lidocaine 4%, Treatment Notes Wound #2 (Right, Posterior Lower Leg) 1. Cleansed with: Clean wound with Normal Saline Cleanse wound with antibacterial soap and water 2. Anesthetic Topical Lidocaine 4% cream to wound bed prior to debridement 4. Dressing Applied: Santyl Ointment 5. Secondary Dressing Applied ABD Pad Dry Gauze 7. Secured with Tape 3 Layer Compression System - Bilateral Notes unna to anchor Electronic Signature(s) Signed: 02/07/2017 1:35:52 PM By: Alric Quan Entered By: Alric Quan on 02/07/2017 08:57:33 [name] ([id number]) -------------------------------------------------------------------------------- Wound Assessment Details Patient Name: SHAMICA, MOREE. Date of Service: 02/07/2017 8:45 AM Medical Record Number: [id number] Patient Account Number: [id number] Date of Birth/Sex: Oct 30, 1951 (66 y.o. Female) Treating RN: Ahmed Prima Primary Care Graylen Noboa: FITZGERALD, DAVID Other Clinician: Referring Fermina Mishkin: FITZGERALD, DAVID Treating Strummer Canipe/Extender: Frann Rider in Treatment: 33 Wound Status Wound Number: 3 Primary Etiology: Pressure Ulcer Wound Location: Right Calcaneus Wound Status: Open Wounding Event: Pressure Injury Date Acquired: 01/24/2017 Weeks Of Treatment: 0 Clustered Wound: No Photos Photo Uploaded By: Alric Quan on 02/07/2017 09:58:06  Wound Measurements Length: (cm) 2.8 Width: (cm) 2.5 Depth: (cm) 0.2 Area: (cm) 5.498 Volume: (cm) 1.1 % Reduction in Area: % Reduction in Volume: Epithelialization: None Tunneling: No Undermining: No Wound Description Classification: Category/Stage II Foul Odor Aft Wound Margin: Distinct, outline attached Slough/Fibrin Exudate Amount: Large Exudate Type: Serosanguineous Exudate Color: red, brown er Cleansing: No o No Wound Bed Granulation Amount: Medium (34-66%)  Exposed Structure Granulation Quality: Red, Pink Fascia Exposed: No Necrotic Amount: Medium (34-66%) Fat Layer (Subcutaneous Tissue) Exposed: No Necrotic Quality: Eschar Periwound Skin Texture Texture Color DEZARAE, MCCLARAN ([id number]) No Abnormalities Noted: No No Abnormalities Noted: No Moisture Temperature / Pain No Abnormalities Noted: No Temperature: No Abnormality Maceration: Yes Tenderness on Palpation: Yes Wound Preparation Ulcer Cleansing: Rinsed/Irrigated with Saline Topical Anesthetic Applied: Other: LIDOCAINE 4%, Treatment Notes Wound #3 (Right Calcaneus) 1. Cleansed with: Clean wound with Normal Saline Cleanse wound with antibacterial soap and water 2. Anesthetic Topical Lidocaine 4% cream to wound bed prior to debridement 4. Dressing Applied: Aquacel Ag 5. Secondary Dressing Applied Dry Gauze Notes foam heel cup Electronic Signature(s) Signed: 02/07/2017 1:35:52 PM By: Alric Quan Entered By: Alric Quan on 02/07/2017 09:27:15 [name] ([id number]) -------------------------------------------------------------------------------- Vitals Details Patient Name: RIAH, KEHOE. Date of Service: 02/07/2017 8:45 AM Medical Record Number: [id number] Patient Account Number: [id number] Date of Birth/Sex: [birth year] (67 y.o. Female) Treating RN: Ahmed Prima Primary Care Remi Rester: FITZGERALD, DAVID Other Clinician: Referring Laurens Matheny: FITZGERALD, DAVID Treating Maelie Chriswell/Extender: Frann Rider in Treatment: 71 Vital Signs Time Taken: 08:48 Temperature (F): 98.0 Height (in): 67 Pulse (bpm): 75 Weight (lbs): 300 Respiratory Rate (breaths/min): 18 Body Mass Index (BMI): 47 Blood Pressure (mmHg): 149/51 Reference Range: 80 - 120 mg / dl Electronic Signature(s) Signed: 02/07/2017 1:35:52 PM By: Alric Quan Entered By: Alric Quan on 02/07/2017 08:48:57

## 2017-02-08 NOTE — Progress Notes (Signed)
SHARENE, KRIKORIAN ([id number]) Visit Report for 02/07/2017 Chief Complaint Document Details Patient Name: Brooke Hopkins, Brooke Hopkins. Date of Service: 02/07/2017 8:45 AM Medical Record Number: [id number] Patient Account Number: [id number] Date of Birth/Sex: 1951-08-12 (66 y.o. Female) Treating RN: Ahmed Prima Primary Care Provider: FITZGERALD, DAVID Other Clinician: Referring Provider: FITZGERALD, DAVID Treating Provider/Extender: Frann Rider in Treatment: 56 Information Obtained from: Patient Chief Complaint [name] returns for follow-up to her bilateral lower extremity ulcers Electronic Signature(s) Signed: 02/07/2017 9:38:17 AM By: Christin Fudge MD, FACS Entered By: Christin Fudge on 02/07/2017 09:38:16 Brooke Hopkins ([id number]) -------------------------------------------------------------------------------- Debridement Details Patient Name: Brooke Hopkins, Brooke Hopkins. Date of Service: 02/07/2017 8:45 AM Medical Record Number: [id number] Patient Account Number: [id number] Date of Birth/Sex: September 01, 1951 (66 y.o. Female) Treating RN: Ahmed Prima Primary Care Provider: FITZGERALD, DAVID Other Clinician: Referring Provider: FITZGERALD, DAVID Treating Provider/Extender: Frann Rider in Treatment: 33 Debridement Performed for Wound #2 Right,Posterior Lower Leg Assessment: Performed By: Physician Christin Fudge, MD Debridement: Debridement Pre-procedure Yes - 09:17 Verification/Time Out Taken: Start Time: 09:18 Pain Control: Lidocaine 4% Topical Solution Level: Skin/Subcutaneous Tissue Total Area Debrided (L x 1 (cm) x 1.7 (cm) = 1.7 (cm) W): Tissue and other Viable, Non-Viable, Exudate, Fibrin/Slough, Subcutaneous material debrided: Instrument: Curette Bleeding: Minimum Hemostasis Achieved: Pressure End Time: 09:19 Procedural Pain: 0 Post Procedural Pain: 0 Response to Treatment: Procedure was tolerated well Post Debridement Measurements of Total  Wound Length: (cm) 1 Width: (cm) 1.7 Depth: (cm) 0.2 Volume: (cm) 0.267 Character of Wound/Ulcer Post Requires Further Debridement Debridement: Severity of Tissue Post Debridement: Fat layer exposed Post Procedure Diagnosis Same as Pre-procedure Electronic Signature(s) Signed: 02/07/2017 9:37:59 AM By: Christin Fudge MD, FACS Signed: 02/07/2017 1:35:52 PM By: Alric Quan Entered By: Christin Fudge on 02/07/2017 09:37:59 Brooke Hopkins ([id number]) Brooke Hopkins, Brooke Hopkins ([id number]) -------------------------------------------------------------------------------- Debridement Details Patient Name: Brooke Hopkins, Brooke Hopkins. Date of Service: 02/07/2017 8:45 AM Medical Record Number: [id number] Patient Account Number: [id number] Date of Birth/Sex: 1951/05/28 (66 y.o. Female) Treating RN: Ahmed Prima Primary Care Provider: FITZGERALD, DAVID Other Clinician: Referring Provider: FITZGERALD, DAVID Treating Provider/Extender: Frann Rider in Treatment: 33 Debridement Performed for Wound #3 Right Calcaneus Assessment: Performed By: Physician Christin Fudge, MD Debridement: Debridement Pre-procedure Yes - 09:17 Verification/Time Out Taken: Start Time: 09:20 Pain Control: Lidocaine 4% Topical Solution Level: Skin/Subcutaneous Tissue Total Area Debrided (L x 2.8 (cm) x 2.5 (cm) = 7 (cm) W): Tissue and other Viable, Non-Viable, Exudate, Fibrin/Slough, Subcutaneous material debrided: Instrument: Forceps, Scissors Bleeding: Minimum Hemostasis Achieved: Pressure End Time: 09:23 Procedural Pain: 0 Post Procedural Pain: 0 Response to Treatment: Procedure was tolerated well Post Debridement Measurements of Total Wound Length: (cm) 2.8 Stage: Category/Stage II Width: (cm) 2.5 Depth: (cm) 0.2 Volume: (cm) 1.1 Character of Wound/Ulcer Post Requires Further Debridement: Debridement Severity of Tissue Post Fat layer exposed Debridement: Post Procedure Diagnosis Same as  Pre-procedure Electronic Signature(s) Signed: 02/07/2017 9:38:08 AM By: Christin Fudge MD, FACS Signed: 02/07/2017 1:35:52 PM By: Alric Quan Entered By: Christin Fudge on 02/07/2017 09:38:08 Brooke Hopkins ([id number]) Brooke Hopkins, Brooke Hopkins ([id number]) -------------------------------------------------------------------------------- HPI Details Patient Name: Brooke Hopkins, Brooke Hopkins. Date of Service: 02/07/2017 8:45 AM Medical Record Number: [id number] Patient Account Number: [id number] Date of Birth/Sex: 17-Oct-1951 (66 y.o. Female) Treating RN: Ahmed Prima Primary Care Provider: FITZGERALD, DAVID Other Clinician: Referring Provider: FITZGERALD, DAVID Treating Provider/Extender: Frann Rider in Treatment: 22 History of Present Illness Location: massive swelling of both lower extremities and ulceration both lower extremities Quality: Patient reports experiencing a dull pain to affected  area(s). Severity: Patient states wound are getting worse. Duration: Patient has had the wound for >2 years prior to seeking treatment at the wound center Timing: Pain in wound is constant (hurts all the time) Context: The wound appeared gradually over time Modifying Factors: Other treatment(s) tried include:she has a lymphedema pump but uses it seldom and she's had several course of antibiotics Associated Signs and Symptoms: Patient reports having difficulty standing for long periods. HPI Description: 66 year old patient seen by Dr. Ola Spurr of infectious disease who has been following up for left lower extremity cellulitis and ulcer with recurrent bilateral lower extremity problems for several months. Recently she had a large right lower extremity bullae which opened out and has been ulcerated. She has seen the vascular group and has been getting Unna's wraps and has a lymphedema pump used in the past. Her prior cultures were positive for Pseudomonas, Proteus and was treated with amoxicillin.  He has also been treated with 2 weeks course of ciprofloxacin and amoxicillin.. Increase of Lasix dose helped with the edema and echo showed no systolic CHF but may be diastolic problems. Past medical history significant for diabetes mellitus type 2, venous stasis ulcer, obesity, diabetic peripheral neuropathy, status post back surgery, cholecystectomy, hysterectomy, arthroscopy of the knee. He is a former smoker and quit smoking in 1984. The patient has seen Dr. Delana Meyer who did not recommend any arterial or venous duplex studies and has been using Unna's wraps and also recommended a lymphedema pump. she has been very noncompliant with using these. 06/28/2016 -- the patient is still on antibiotics as prescribed by Dr. Ola Spurr and he is asked her to take it for 3 weeks. The patient also says she has a lot of redness and pain in the folds of her thigh and lower extremity and this is very painful. 07/26/2016 -- the patient is off antibiotics and has been getting dressing changes 3 times a week. 08/09/2016 -- is been on Cipro and is taking potassium supplements along with her Lasix and is going to be seeing Dr. Ola Spurr for a consult return visit only in November 2017. 08/23/2016 -- she was admitted to the hospital last week and I have reviewed these reports in detail. She was seen by Dr. Ola Spurr who noted a Pseudomonas infection which was resistant to ciprofloxacin and discharged her on Ceftazidime 1 g IV every 12 hourly anyone days. The antibiotic was to be stopped on September 11 and he would see her back in the clinic. 08/30/2016 -- had a communication from Dr. Ola Spurr that he would extend her antibiotics by a week if she continued to look like cellulitis was persisting. Brooke Hopkins, Brooke Hopkins ([id number]) 09/13/2016 -- the PICC line is out and antibiotics have stopped. 10/04/16: returns today for f/u. reports utilizes compression pump twice daily. she is compliant with her compression  therapy. she reports a new blister on the right proximal posterior calf. no systemic s/s of infection. 10/11/16: returns today for f/u. she reports adherence to her compression pumps, but there is no improvement regarding her BLE edema. there is weeping of fluid. denies fever, chills, body aches or malaise. 10/18/2016 -- she has a significant cellulitis of her right lower extremity which was not there last week. I believe at this stage she will need the expertise of Dr. Ola Spurr to decide on antibiotic coverage. 10/25/2016 -- the patient has had cipro and ampicillin been started by Dr. Ola Spurr and he is awaiting further cultures. Overall she's been feeling better. 11/08/2016 -- Dr. Ola Spurr  this week who has continued with ciprofloxacin and amoxicillin and is awaiting further cultures before deciding to place her on a PICC line. 11/15/2016 -- Dr. Blane Ohara note was reviewed and her cultures growing Pseudomonas in both legs which is sensitive to Cipro and the other is resistant to Cipro and she would get a PICC line and start IV antibiotics -- Ceftazidime 1 g q 12 for 3 weeks. 11-29-16 Mrs. Prien presents today in follow-up of her bilateral lower exterminators ulcers. She remains on IV antibiotic therapy via a PICC line per Dr. Ola Spurr; she has a follow-up appointment with him on Monday, 12/02/2016. She anticipates that the about therapy will be discontinued at that appointment. She denies any complications of nausea vomiting and/or diarrhea accompanied by this antibiotic therapy. She admits to using her lymphedema pumps twice daily with the exception of yesterday. She denies any concerns or complications with the current treatment plan. 12/06/2016 -- Dr. Ola Spurr saw her recently on 12/02/2016 and due to the fact she has had significant problems with recurrent ulceration and Pseudomonas infection he recommended 3 more weeks of antibiotics and extended it until December 25. The  PICC line will be in place. Last hemoglobin A1c on November 30 was 9% 12/27/2016 -- because of the holidays the patient's diet has been higher in salt, her dressings have not been done as required and she is not used to lymphedema pumps appropriately. This has led to her lymphedema increasing markedly. 01/24/2017 -- the patient's hospital bed is malfunctioning and hence her legs have been flat in bed and her lymphedema is increased markedly. During my examination I also noted a unstageable pressure injury to her right heel 02/07/2017 -- she has returned after 2 weeks as she was done with influenza and has been in bed a lot, with a new bed which has helped her reduce the edema due to proper elevation. Will she has a full-fledged large pressure injury to the right heel. Electronic Signature(s) Signed: 02/07/2017 9:39:10 AM By: Christin Fudge MD, FACS Entered By: Christin Fudge on 02/07/2017 09:39:10 Brooke Hopkins ([id number]) -------------------------------------------------------------------------------- Physical Exam Details Patient Name: LINDIA, GARMS. Date of Service: 02/07/2017 8:45 AM Medical Record Number: [id number] Patient Account Number: [id number] Date of Birth/Sex: 1951-06-23 (66 y.o. Female) Treating RN: Ahmed Prima Primary Care Provider: FITZGERALD, DAVID Other Clinician: Referring Provider: FITZGERALD, DAVID Treating Provider/Extender: Frann Rider in Treatment: 33 Constitutional . Pulse regular. Respirations normal and unlabored. Afebrile. . Eyes Nonicteric. Reactive to light. Ears, Nose, Mouth, and Throat Lips, teeth, and gums WNL.Marland Kitchen Moist mucosa without lesions. Neck supple and nontender. No palpable supraclavicular or cervical adenopathy. Normal sized without goiter. Respiratory WNL. No retractions.. Breath sounds WNL, No rubs, rales, rhonchi, or wheeze.. Cardiovascular Heart rhythm and rate regular, no murmur or gallop.. Pedal Pulses WNL. No  clubbing, cyanosis or edema. Chest Breasts symmetical and no nipple discharge.. Breast tissue WNL, no masses, lumps, or tenderness.. Lymphatic No adneopathy. No adenopathy. No adenopathy. Musculoskeletal Adexa without tenderness or enlargement.. Digits and nails w/o clubbing, cyanosis, infection, petechiae, ischemia, or inflammatory conditions.. Integumentary (Hair, Skin) No suspicious lesions. No crepitus or fluctuance. No peri-wound warmth or erythema. No masses.Marland Kitchen Psychiatric Judgement and insight Intact.. No evidence of depression, anxiety, or agitation.. Notes the wounds have improved markedly due to excellent resolution of her lymphedema and the left lower extremity does not need any debridement and the right lower extremity needs curettage with a #3 curet and bleeding controlled with pressure. The right heel has a necrotic skin  and subcutaneous tissue which have sharply removed with a forcep and scissors and the subcutaneous tissue which is exposed will be treated appropriately Electronic Signature(s) Signed: 02/07/2017 9:40:04 AM By: Christin Fudge MD, FACS Entered By: Christin Fudge on 02/07/2017 09:40:03 Brooke Hopkins ([id number]) -------------------------------------------------------------------------------- Physician Orders Details Patient Name: Brooke Hopkins, AZUCENA. Date of Service: 02/07/2017 8:45 AM Medical Record Number: [id number] Patient Account Number: [id number] Date of Birth/Sex: 01-08-1951 (66 y.o. Female) Treating RN: Ahmed Prima Primary Care Provider: FITZGERALD, DAVID Other Clinician: Referring Provider: FITZGERALD, DAVID Treating Provider/Extender: Frann Rider in Treatment: 54 Verbal / Phone Orders: Yes Clinician: Carolyne Fiscal, Debi Read Back and Verified: Yes Diagnosis Coding Wound Cleansing Wound #1 Left,Posterior Lower Leg o Clean wound with Normal Saline. - clinic use o Cleanse wound with mild soap and water - HHRN please scrub wounds  with mild soap and water o May Shower, gently pat wound dry prior to applying new dressing. o May shower with protection. Wound #2 Right,Posterior Lower Leg o Clean wound with Normal Saline. - clinic use o Cleanse wound with mild soap and water - HHRN please scrub wounds with mild soap and water o May Shower, gently pat wound dry prior to applying new dressing. o May shower with protection. Wound #3 Right Calcaneus o Clean wound with Normal Saline. - clinic use o Cleanse wound with mild soap and water - HHRN please scrub wounds with mild soap and water o May Shower, gently pat wound dry prior to applying new dressing. o May shower with protection. Anesthetic Wound #1 Left,Posterior Lower Leg o Topical Lidocaine 4% cream applied to wound bed prior to debridement - clinic use Wound #2 Right,Posterior Lower Leg o Topical Lidocaine 4% cream applied to wound bed prior to debridement - clinic use Wound #3 Right Calcaneus o Topical Lidocaine 4% cream applied to wound bed prior to debridement - clinic use Primary Wound Dressing Wound #1 Left,Posterior Lower Leg o Aquacel Ag Wound #2 Right,Posterior Lower Leg o Santyl Ointment - use on the posterior leg wounds Wound #3 Right Calcaneus REIGHAN, HIPOLITO ([id number]) o Aquacel Ag Secondary Dressing Wound #1 Left,Posterior Lower Leg o ABD pad o Dry Gauze Wound #2 Right,Posterior Lower Leg o ABD pad o Dry Gauze Wound #3 Right Calcaneus o Dry Gauze o Other - FOAM HEEL CUP Dressing Change Frequency Wound #1 Left,Posterior Lower Leg o Change Dressing Monday, Wednesday, Friday - HHRN to change wraps Monday and Wednesday Wound #2 Right,Posterior Lower Leg o Change Dressing Monday, Wednesday, Friday - HHRN to change wraps Monday and Wednesday o Change Dressing Monday, Wednesday, Friday - HHRN to change wraps Monday and Wednesday Follow-up Appointments Wound #1 Left,Posterior Lower Leg o  Return Appointment in 1 week. Wound #2 Right,Posterior Lower Leg o Return Appointment in 1 week. Edema Control Wound #1 Left,Posterior Lower Leg o 3 Layer Compression System - Bilateral - May anchor top of wrap with dome paste, but only wrap around leg one time. o Elevate legs to the level of the heart and pump ankles as often as possible o Compression Pump: Use compression pump on left lower extremity for 30 minutes, twice daily. o Compression Pump: Use compression pump on right lower extremity for 30 minutes, twice daily. Wound #2 Right,Posterior Lower Leg o 3 Layer Compression System - Bilateral - May anchor top of wrap with dome paste, but only wrap around leg one time. o Elevate legs to the level of the heart and pump ankles as often as possible o Compression Pump: Use  compression pump on left lower extremity for 30 minutes, twice daily. Brooke Hopkins, Brooke Hopkins. ([id number]) o Compression Pump: Use compression pump on right lower extremity for 30 minutes, twice daily. Additional Orders / Instructions o Increase protein intake. Wound #2 Right,Posterior Lower Leg o Increase protein intake. Home Health Wound #1 [facility] [place] Nurse may visit PRN to address patientos wound care needs. o FACE TO FACE ENCOUNTER: MEDICARE and MEDICAID PATIENTS: I certify that this patient is under my care and that I had a face-to-face encounter that meets the physician face-to-face encounter requirements with this patient on this date. The encounter with the patient was in whole or in part for the following MEDICAL CONDITION: (primary reason for [place]) MEDICAL NECESSITY: I certify, that based on my findings, NURSING services are a medically necessary home health service. HOME BOUND STATUS: I certify that my clinical findings support that this patient is homebound (i.e., Due to illness or injury, pt  requires aid of supportive devices such as crutches, cane, wheelchairs, walkers, the use of special transportation or the assistance of another person to leave their place of residence. There is a normal inability to leave the home and doing so requires considerable and taxing effort. Other absences are for medical reasons / religious services and are infrequent or of short duration when for other reasons). o Please direct any NON-WOUND related issues/requests for orders to patient's Primary Care Physician Wound #2 [place] [facility] [place] Nurse may visit PRN to address patientos wound care needs. o FACE TO FACE ENCOUNTER: MEDICARE and MEDICAID PATIENTS: I certify that this patient is under my care and that I had a face-to-face encounter that meets the physician face-to-face encounter requirements with this patient on this date. The encounter with the patient was in whole or in part for the following MEDICAL CONDITION: (primary reason for [place]) MEDICAL NECESSITY: I certify, that based on my findings, NURSING services are a medically necessary home health service. HOME BOUND STATUS: I certify that my clinical findings support that this patient is homebound (i.e., Due to illness or injury, pt requires aid of supportive devices such as crutches, cane, wheelchairs, walkers, the use of special transportation or the assistance of another person to leave their place of residence. There is a normal inability to leave the home and doing so requires considerable and taxing effort. Other absences are for medical reasons / religious services and are infrequent or of short duration when for other reasons). o Please direct any NON-WOUND related issues/requests for orders to patient's Primary Care Physician Patient Medications Allergies: no know allergies Notifications Medication Indication Start End KLOEY, CAZAREZ  ([id number]) Notifications Medication Indication Start End Santyl 02/07/2017 DOSE topical 250 unit/gram ointment - ointment topical as directed Electronic Signature(s) Signed: 02/07/2017 9:41:47 AM By: Christin Fudge MD, FACS Entered By: Christin Fudge on 02/07/2017 09:41:45 Brooke Hopkins ([id number]) -------------------------------------------------------------------------------- Problem List Details Patient Name: TANGANIKA, BARRADAS. Date of Service: 02/07/2017 8:45 AM Medical Record Number: [id number] Patient Account Number: [id number] Date of Birth/Sex: 1951/02/23 (66 y.o. Female) Treating RN: Ahmed Prima Primary Care Provider: FITZGERALD, DAVID Other Clinician: Referring Provider: FITZGERALD, DAVID Treating Provider/Extender: Frann Rider in Treatment: 50 Active Problems ICD-10 Encounter Code Description Active Date Diagnosis E11.622 Type 2 diabetes mellitus with other skin ulcer 10/25/2016 Yes I89.0 Lymphedema, not elsewhere classified 10/25/2016 Yes L97.222 Non-pressure chronic ulcer of left calf with fat  layer 10/25/2016 Yes exposed L97.212 Non-pressure chronic ulcer of right calf with fat layer 10/25/2016 Yes exposed E66.01 Morbid (severe) obesity due to excess calories 10/25/2016 Yes L89.612 Pressure ulcer of right heel, stage 2 02/07/2017 Yes Inactive Problems Resolved Problems Electronic Signature(s) Signed: 02/07/2017 9:37:41 AM By: Christin Fudge MD, FACS Entered By: Christin Fudge on 02/07/2017 09:37:40 Brooke Hopkins ([id number]) -------------------------------------------------------------------------------- Progress Note Details Patient Name: JIAH, BARI. Date of Service: 02/07/2017 8:45 AM Medical Record Number: [id number] Patient Account Number: [id number] Date of Birth/Sex: 1951-08-14 (66 y.o. Female) Treating RN: Ahmed Prima Primary Care Provider: FITZGERALD, DAVID Other Clinician: Referring Provider: FITZGERALD, DAVID Treating  Provider/Extender: Frann Rider in Treatment: 61 Subjective Chief Complaint Information obtained from Patient Mrs. Twersky returns for follow-up to her bilateral lower extremity ulcers History of Present Illness (HPI) The following HPI elements were documented for the patient's wound: Location: massive swelling of both lower extremities and ulceration both lower extremities Quality: Patient reports experiencing a dull pain to affected area(s). Severity: Patient states wound are getting worse. Duration: Patient has had the wound for >2 years prior to seeking treatment at the wound center Timing: Pain in wound is constant (hurts all the time) Context: The wound appeared gradually over time Modifying Factors: Other treatment(s) tried include:she has a lymphedema pump but uses it seldom and she's had several course of antibiotics Associated Signs and Symptoms: Patient reports having difficulty standing for long periods. 66 year old patient seen by Dr. Ola Spurr of infectious disease who has been following up for left lower extremity cellulitis and ulcer with recurrent bilateral lower extremity problems for several months. Recently she had a large right lower extremity bullae which opened out and has been ulcerated. She has seen the vascular group and has been getting Unna's wraps and has a lymphedema pump used in the past. Her prior cultures were positive for Pseudomonas, Proteus and was treated with amoxicillin. He has also been treated with 2 weeks course of ciprofloxacin and amoxicillin.. Increase of Lasix dose helped with the edema and echo showed no systolic CHF but may be diastolic problems. Past medical history significant for diabetes mellitus type 2, venous stasis ulcer, obesity, diabetic peripheral neuropathy, status post back surgery, cholecystectomy, hysterectomy, arthroscopy of the knee. He is a former smoker and quit smoking in 1984. The patient has seen Dr. Delana Meyer who  did not recommend any arterial or venous duplex studies and has been using Unna's wraps and also recommended a lymphedema pump. she has been very noncompliant with using these. 06/28/2016 -- the patient is still on antibiotics as prescribed by Dr. Ola Spurr and he is asked her to take it for 3 weeks. The patient also says she has a lot of redness and pain in the folds of her thigh and lower extremity and this is very painful. 07/26/2016 -- the patient is off antibiotics and has been getting dressing changes 3 times a week. 08/09/2016 -- is been on Cipro and is taking potassium supplements along with her Lasix and is going to be seeing Dr. Ola Spurr for a consult return visit only in November 2017. Brooke Hopkins, Brooke Hopkins ([id number]) 08/23/2016 -- she was admitted to the hospital last week and I have reviewed these reports in detail. She was seen by Dr. Ola Spurr who noted a Pseudomonas infection which was resistant to ciprofloxacin and discharged her on Ceftazidime 1 g IV every 12 hourly anyone days. The antibiotic was to be stopped on September 11 and he would see her back in the clinic. 08/30/2016 --  had a communication from Dr. Ola Spurr that he would extend her antibiotics by a week if she continued to look like cellulitis was persisting. 09/13/2016 -- the PICC line is out and antibiotics have stopped. 10/04/16: returns today for f/u. reports utilizes compression pump twice daily. she is compliant with her compression therapy. she reports a new blister on the right proximal posterior calf. no systemic s/s of infection. 10/11/16: returns today for f/u. she reports adherence to her compression pumps, but there is no improvement regarding her BLE edema. there is weeping of fluid. denies fever, chills, body aches or malaise. 10/18/2016 -- she has a significant cellulitis of her right lower extremity which was not there last week. I believe at this stage she will need the expertise of Dr.  Ola Spurr to decide on antibiotic coverage. 10/25/2016 -- the patient has had cipro and ampicillin been started by Dr. Ola Spurr and he is awaiting further cultures. Overall she's been feeling better. 11/08/2016 -- Dr. Ola Spurr this week who has continued with ciprofloxacin and amoxicillin and is awaiting further cultures before deciding to place her on a PICC line. 11/15/2016 -- Dr. Blane Ohara note was reviewed and her cultures growing Pseudomonas in both legs which is sensitive to Cipro and the other is resistant to Cipro and she would get a PICC line and start IV antibiotics -- Ceftazidime 1 g q 12 for 3 weeks. 11-29-16 Mrs. Vetter presents today in follow-up of her bilateral lower exterminators ulcers. She remains on IV antibiotic therapy via a PICC line per Dr. Ola Spurr; she has a follow-up appointment with him on Monday, 12/02/2016. She anticipates that the about therapy will be discontinued at that appointment. She denies any complications of nausea vomiting and/or diarrhea accompanied by this antibiotic therapy. She admits to using her lymphedema pumps twice daily with the exception of yesterday. She denies any concerns or complications with the current treatment plan. 12/06/2016 -- Dr. Ola Spurr saw her recently on 12/02/2016 and due to the fact she has had significant problems with recurrent ulceration and Pseudomonas infection he recommended 3 more weeks of antibiotics and extended it until December 25. The PICC line will be in place. Last hemoglobin A1c on November 30 was 9% 12/27/2016 -- because of the holidays the patient's diet has been higher in salt, her dressings have not been done as required and she is not used to lymphedema pumps appropriately. This has led to her lymphedema increasing markedly. 01/24/2017 -- the patient's hospital bed is malfunctioning and hence her legs have been flat in bed and her lymphedema is increased markedly. During my examination I also  noted a unstageable pressure injury to her right heel 02/07/2017 -- she has returned after 2 weeks as she was done with influenza and has been in bed a lot, with a new bed which has helped her reduce the edema due to proper elevation. Will she has a full-fledged large pressure injury to the right heel. Brooke Hopkins, Brooke Hopkins. ([id number]) Objective Constitutional Pulse regular. Respirations normal and unlabored. Afebrile. Vitals Time Taken: 8:48 AM, Height: 67 in, Weight: 300 lbs, BMI: 47, Temperature: 98.0 F, Pulse: 75 bpm, Respiratory Rate: 18 breaths/min, Blood Pressure: 149/51 mmHg. Eyes Nonicteric. Reactive to light. Ears, Nose, Mouth, and Throat Lips, teeth, and gums WNL.Marland Kitchen Moist mucosa without lesions. Neck supple and nontender. No palpable supraclavicular or cervical adenopathy. Normal sized without goiter. Respiratory WNL. No retractions.. Breath sounds WNL, No rubs, rales, rhonchi, or wheeze.. Cardiovascular Heart rhythm and rate regular, no murmur or gallop.. Pedal  Pulses WNL. No clubbing, cyanosis or edema. Chest Breasts symmetical and no nipple discharge.. Breast tissue WNL, no masses, lumps, or tenderness.. Lymphatic No adneopathy. No adenopathy. No adenopathy. Musculoskeletal Adexa without tenderness or enlargement.. Digits and nails w/o clubbing, cyanosis, infection, petechiae, ischemia, or inflammatory conditions.Marland Kitchen Psychiatric Judgement and insight Intact.. No evidence of depression, anxiety, or agitation.. General Notes: the wounds have improved markedly due to excellent resolution of her lymphedema and the left lower extremity does not need any debridement and the right lower extremity needs curettage with a #3 curet and bleeding controlled with pressure. The right heel has a necrotic skin and subcutaneous tissue which have sharply removed with a forcep and scissors and the subcutaneous tissue which is exposed will be treated appropriately Integumentary (Hair,  Skin) No suspicious lesions. No crepitus or fluctuance. No peri-wound warmth or erythema. No masses.Marland Kitchen Brooke Hopkins, Brooke Hopkins. ([id number]) Wound #1 status is Open. Original cause of wound was Gradually Appeared. The wound is located on the Left,Posterior Lower Leg. The wound measures 3.3cm length x 0.8cm width x 0.1cm depth; 2.073cm^2 area and 0.207cm^3 volume. The wound is limited to skin breakdown. There is no tunneling or undermining noted. There is a large amount of serous drainage noted. The wound margin is flat and intact. There is large (67-100%) granulation within the wound bed. There is a small (1-33%) amount of necrotic tissue within the wound bed including Adherent Slough. The periwound skin appearance exhibited: Excoriation, Maceration, Rubor. The periwound skin appearance did not exhibit: Callus, Crepitus, Induration, Rash, Scarring, Dry/Scaly, Atrophie Blanche, Cyanosis, Ecchymosis, Hemosiderin Staining, Mottled, Pallor, Erythema. Periwound temperature was noted as No Abnormality. The periwound has tenderness on palpation. Wound #2 status is Open. Original cause of wound was Gradually Appeared. The wound is located on the Right,Posterior Lower Leg. The wound measures 1cm length x 1.7cm width x 0.2cm depth; 1.335cm^2 area and 0.267cm^3 volume. The wound is limited to skin breakdown. There is no tunneling or undermining noted. There is a large amount of serous drainage noted. The wound margin is indistinct and nonvisible. There is medium (34-66%) granulation within the wound bed. There is a medium (34-66%) amount of necrotic tissue within the wound bed including Adherent Slough. The periwound skin appearance exhibited: Excoriation, Maceration, Hemosiderin Staining, Rubor, Erythema. The periwound skin appearance did not exhibit: Callus, Crepitus, Induration, Rash, Scarring, Dry/Scaly, Atrophie Blanche, Cyanosis, Ecchymosis, Mottled, Pallor. The surrounding wound skin color is noted with  erythema which is circumferential. Periwound temperature was noted as No Abnormality. The periwound has tenderness on palpation. Wound #3 status is Open. Original cause of wound was Pressure Injury. The wound is located on the Right Calcaneus. The wound measures 2.8cm length x 2.5cm width x 0.2cm depth; 5.498cm^2 area and 1.1cm^3 volume. There is no tunneling or undermining noted. There is a large amount of serosanguineous drainage noted. The wound margin is distinct with the outline attached to the wound base. There is medium (34-66%) red, pink granulation within the wound bed. There is a medium (34-66%) amount of necrotic tissue within the wound bed including Eschar. The periwound skin appearance exhibited: Maceration. Periwound temperature was noted as No Abnormality. The periwound has tenderness on palpation. 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 ([id number]) Wound #2 Wound #2 is a Lymphedema located on the Right,Posterior Lower Leg . There was a Skin/Subcutaneous Tissue Debridement ([id number]) debridement with total area of 1.7 sq cm performed by Christin Fudge, MD. with the following instrument(s): Curette to remove Viable and Non-Viable tissue/material including Exudate, Fibrin/Slough, and Subcutaneous after achieving pain control using Lidocaine 4% Topical Solution. A time out was conducted at 09:17, prior to the start of the procedure. A Minimum amount of bleeding was controlled with Pressure. The procedure was tolerated well with a pain level of 0 throughout and a pain level of 0 following the procedure.  Post Debridement Measurements: 1cm length x 1.7cm width x 0.2cm depth; 0.267cm^3 volume. Character of Wound/Ulcer Post Debridement requires further debridement. Severity of Tissue Post Debridement is: Fat layer exposed. Post procedure Diagnosis Wound #2: Same as Pre-Procedure Wound #3 Wound #3 is a Pressure Ulcer located on the Right Calcaneus . There was a Skin/Subcutaneous Tissue Debridement ([id number]) debridement with total area of 7 sq cm performed by Christin Fudge, MD. with the following instrument(s): Forceps and Scissors to remove Viable and Non-Viable tissue/material including Exudate, Fibrin/Slough, and Subcutaneous after achieving pain control using Lidocaine 4% Topical Solution. A time out was conducted at 09:17, prior to the start of the procedure. A Minimum amount of bleeding was controlled with Pressure. The procedure was tolerated well with a pain level of 0 throughout and a pain level of 0 following the procedure. Post Debridement Measurements: 2.8cm length x 2.5cm width x 0.2cm depth; 1.1cm^3 volume. Post debridement Stage noted as Category/Stage II. Character of Wound/Ulcer Post Debridement requires further debridement. Severity of Tissue Post Debridement is: Fat layer exposed. Post procedure Diagnosis Wound #3: Same as Pre-Procedure Plan Wound Cleansing: Wound #1 Left,Posterior Lower Leg: Clean wound with Normal Saline. - clinic use Cleanse wound with mild soap and water - HHRN please scrub wounds with mild soap and water May Shower, gently pat wound dry prior to applying new dressing. May shower with protection. Wound #2 Right,Posterior Lower Leg: Clean wound with Normal Saline. - clinic use Cleanse wound with mild soap and water - HHRN please scrub wounds with mild soap and water May Shower, gently pat wound dry prior to applying new dressing. May shower with protection. Wound #3 Right Calcaneus: Clean wound with Normal Saline. - clinic use Cleanse wound with  mild soap and water - HHRN please scrub wounds with mild soap and water May Shower, gently pat wound dry prior to applying new dressing. KENDLE, TURBIN ([id number]) May shower with protection. Anesthetic: Wound #1 Left,Posterior Lower Leg: Topical Lidocaine 4% cream applied to wound bed prior to debridement - clinic use Wound #2 Right,Posterior Lower Leg: Topical Lidocaine 4% cream applied to wound bed prior to debridement - clinic use Wound #3 Right Calcaneus: Topical Lidocaine 4% cream applied to wound bed prior to debridement - clinic use Primary Wound Dressing: Wound #1 Left,Posterior Lower Leg: Aquacel Ag Wound #2 Right,Posterior Lower Leg: Santyl Ointment - use on the posterior leg wounds Wound #3 Right Calcaneus: Aquacel Ag Secondary Dressing: Wound #1 Left,Posterior Lower Leg: ABD pad Dry Gauze Wound #2 Right,Posterior Lower Leg: ABD pad Dry Gauze Wound #3 Right Calcaneus: Dry Gauze Other - FOAM HEEL CUP Dressing Change Frequency: Wound #1 Left,Posterior Lower Leg: Change Dressing Monday, Wednesday, Friday - HHRN to change wraps Monday and Wednesday Wound #2 Right,Posterior Lower Leg: Change Dressing Monday, Wednesday, Friday - HHRN to change wraps Monday  and Wednesday Change Dressing Monday, Wednesday, Friday - HHRN to change wraps Monday and Wednesday Follow-up Appointments: Wound #1 Left,Posterior Lower Leg: Return Appointment in 1 week. Wound #2 Right,Posterior Lower Leg: Return Appointment in 1 week. Edema Control: Wound #1 Left,Posterior Lower Leg: 3 Layer Compression System - Bilateral - May anchor top of wrap with dome paste, but only wrap around leg one time. Elevate legs to the level of the heart and pump ankles as often as possible Compression Pump: Use compression pump on left lower extremity for 30 minutes, twice daily. Compression Pump: Use compression pump on right lower extremity for 30 minutes, twice daily. Wound #2 Right,Posterior Lower  Leg: 3 Layer Compression System - Bilateral - May anchor top of wrap with dome paste, but only wrap around leg one time. Elevate legs to the level of the heart and pump ankles as often as possible Compression Pump: Use compression pump on left lower extremity for 30 minutes, twice daily. Compression Pump: Use compression pump on right lower extremity for 30 minutes, twice daily. Additional Orders / Instructions: Increase protein intake. HARVEST, DEIST. ([id number]) Wound #2 Right,Posterior Lower Leg: Increase protein intake. Home Health: Wound #1 Left,Posterior Lower Leg: Continue Home Health Visits - [place] Nurse may visit PRN to address patient s wound care needs. FACE TO FACE ENCOUNTER: MEDICARE and MEDICAID PATIENTS: I certify that this patient is under my care and that I had a face-to-face encounter that meets the physician face-to-face encounter requirements with this patient on this date. The encounter with the patient was in whole or in part for the following MEDICAL CONDITION: (primary reason for [place]) MEDICAL NECESSITY: I certify, that based on my findings, NURSING services are a medically necessary home health service. HOME BOUND STATUS: I certify that my clinical findings support that this patient is homebound (i.e., Due to illness or injury, pt requires aid of supportive devices such as crutches, cane, wheelchairs, walkers, the use of special transportation or the assistance of another person to leave their place of residence. There is a normal inability to leave the home and doing so requires considerable and taxing effort. Other absences are for medical reasons / religious services and are infrequent or of short duration when for other reasons). Please direct any NON-WOUND related issues/requests for orders to patient's Primary Care Physician Wound #2 Right,Posterior Lower Leg: [place] Visits - [place] Nurse may visit  PRN to address patient s wound care needs. FACE TO FACE ENCOUNTER: MEDICARE and MEDICAID PATIENTS: I certify that this patient is under my care and that I had a face-to-face encounter that meets the physician face-to-face encounter requirements with this patient on this date. The encounter with the patient was in whole or in part for the following MEDICAL CONDITION: (primary reason for [place]) MEDICAL NECESSITY: I certify, that based on my findings, NURSING services are a medically necessary home health service. HOME BOUND STATUS: I certify that my clinical findings support that this patient is homebound (i.e., Due to illness or injury, pt requires aid of supportive devices such as crutches, cane, wheelchairs, walkers, the use of special transportation or the assistance of another person to leave their place of residence. There is a normal inability to leave the home and doing so requires considerable and taxing effort. Other absences are for medical reasons / religious services and are infrequent or of short duration when for other reasons). Please direct any NON-WOUND related issues/requests for orders to patient's Primary  Care Physician The following medication(s) was prescribed: Santyl topical 250 unit/gram ointment ointment topical as directed starting 02/07/2017 I have recommended: 1. silver alginate and drawtex and a 3 layer Profore to both lower extremities.. 2. She will continue with elevation and exercise and her lymphedema pumps -- I had a long serious discussion with her regarding the importance of this 3. Santyl to be applied at the posterior part of her right calf wound. 4. changing her dressings twice a week. 5. right heel will need silver alginate, be protected with a heel cup and again offloading techniques have been discussed in great detail JAYDE, MCALLISTER. ([id number]) 6. I have had a detailed discussion with them, asked to be compliant with the appropriate  dietary restriction, elevation and use of her lymphedema pumps. Electronic Signature(s) Signed: 02/07/2017 9:43:17 AM By: Christin Fudge MD, FACS Entered By: Christin Fudge on 02/07/2017 09:43:17 Brooke Hopkins ([id number]) -------------------------------------------------------------------------------- SuperBill Details Patient Name: KIEREN, RICCI. Date of Service: 02/07/2017 Medical Record Number: [id number] Patient Account Number: [id number] Date of Birth/Sex: December 21, 1951 (66 y.o. 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 E66.01 Morbid (severe) obesity due to excess calories L89.612 Pressure ulcer of right heel, stage 2 Facility Procedures CPT4 Code: 66063016 Description: 01093 - DEB SUBQ TISSUE 20 SQ CM/< ICD-10 Description Diagnosis E11.622 Type 2 diabetes mellitus with other skin ulcer L89.612 Pressure ulcer of right heel, stage 2 L97.212 Non-pressure chronic ulcer of right calf with fat L97.222  Non-pressure chronic ulcer of left calf with fat l Modifier: layer exposed ayer exposed Quantity: 1 Physician Procedures CPT4 Code: 2355732 Description: 20254 - WC PHYS LEVEL 3 - EST PT ICD-10 Description Diagnosis E11.622 Type 2 diabetes mellitus with other skin ulcer I89.0 Lymphedema, not elsewhere classified L97.222 Non-pressure chronic ulcer of left calf with fat l L89.612 Pressure ulcer  of right heel, stage 2 Modifier: 25 ayer exposed Quantity: 1 CPT4 Code: [id number] Diehl, L Description: 11042 - WC PHYS SUBQ  TISS 20 SQ CM ICD-10 Description Diagnosis E11.622 Type 2 diabetes mellitus with other skin ulcer INDA M. ([id number]) Modifier: Quantity: 1 Electronic Signature(s) Signed: 02/07/2017 9:43:44 AM By: Christin Fudge MD, FACS Entered By: Christin Fudge on 02/07/2017 09:43:44

## 2017-02-14 ENCOUNTER — Encounter: Payer: 59 | Admitting: Surgery

## 2017-02-14 DIAGNOSIS — L89612 Pressure ulcer of right heel, stage 2: Secondary | ICD-10-CM | POA: Diagnosis not present

## 2017-02-14 DIAGNOSIS — L97222 Non-pressure chronic ulcer of left calf with fat layer exposed: Secondary | ICD-10-CM | POA: Diagnosis not present

## 2017-02-14 DIAGNOSIS — L97212 Non-pressure chronic ulcer of right calf with fat layer exposed: Secondary | ICD-10-CM | POA: Diagnosis not present

## 2017-02-14 DIAGNOSIS — I890 Lymphedema, not elsewhere classified: Secondary | ICD-10-CM | POA: Diagnosis not present

## 2017-02-14 DIAGNOSIS — E11622 Type 2 diabetes mellitus with other skin ulcer: Secondary | ICD-10-CM | POA: Diagnosis not present

## 2017-02-15 NOTE — Progress Notes (Signed)
Brooke Hopkins ([id number]) Visit Report for 02/14/2017 Arrival Information Details Patient Name: Brooke, Hopkins. Date of Service: 02/14/2017 8:45 AM Medical Record Number: [id number] Patient Account Number: [id number] Date of Birth/Sex: 1951-01-25 (66 y.o. Female) Treating RN: Ahmed Prima Primary Care Jenson Beedle: FITZGERALD, DAVID Other Clinician: Referring Dawnelle Warman: FITZGERALD, DAVID Treating Taysha Majewski/Extender: Frann Rider in Treatment: 43 Visit Information History Since Last Visit All ordered tests and consults were completed: No Patient Arrived: Wheel Chair Added or deleted any medications: No Arrival Time: 08:42 Any new allergies or adverse reactions: No Accompanied By: son Had a fall or experienced change in No Transfer Assistance: EasyP[employer] activities of daily living that may affect Patient Lift risk of falls: Patient Identification Verified: Yes Signs or symptoms of abuse/neglect since last No Secondary Verification Process Yes visito Completed: Hospitalized since last visit: No Patient Requires Transmission- No Has Dressing in Place as Prescribed: Yes Based Precautions: Has Compression in Place as Prescribed: Yes Patient Has Alerts: No Pain Present Now: No Electronic Signature(s) Signed: 02/14/2017 4:03:50 PM By: Alric Quan Entered By: Alric Quan on 02/14/2017 08:43:13 Brooke Hopkins ([id number]) -------------------------------------------------------------------------------- Encounter Discharge Information Details Patient Name: Brooke, Hopkins. Date of Service: 02/14/2017 8:45 AM Medical Record Number: [id number] Patient Account Number: [id number] Date of Birth/Sex: [birth year] (66 y.o. Female) Treating RN: Ahmed Prima Primary Care Ermias Tomeo: FITZGERALD, DAVID Other Clinician: Referring Jeshurun Oaxaca: FITZGERALD, DAVID Treating Kamya Watling/Extender: Frann Rider in Treatment: 44 Encounter Discharge Information  Items Discharge Pain Level: 0 Discharge Condition: Stable Ambulatory Status: Wheelchair Discharge Destination: Home Transportation: Private Auto Accompanied By: son Schedule Follow-up Appointment: Yes Medication Reconciliation completed and provided to Patient/Care No Edna Rede: Provided on Clinical Summary of Care: 02/14/2017 Form Type Recipient Paper Patient LB Electronic Signature(s) Signed: 02/14/2017 9:32:02 AM By: Ruthine Dose Entered By: Ruthine Dose on 02/14/2017 09:32:02 Brooke Hopkins ([id number]) -------------------------------------------------------------------------------- Lower Extremity Assessment Details Patient Name: Brooke Hopkins. Date of Service: 02/14/2017 8:45 AM Medical Record Number: [id number] Patient Account Number: [id number] Date of Birth/Sex: May 05, 1951 (66 y.o. Female) Treating RN: Ahmed Prima Primary Care Deja Kaigler: FITZGERALD, DAVID Other Clinician: Referring Fender Herder: FITZGERALD, DAVID Treating Malikah Lakey/Extender: Frann Rider in Treatment: 34 Edema Assessment Assessed: [Left: No] [Right: No] E[Left: dema] [Right: :] Calf Left: Right: Point of Measurement: 32 cm From Medial Instep 47.6 cm 47 cm Ankle Left: Right: Point of Measurement: 11 cm From Medial Instep 26.2 cm 27.6 cm Vascular Assessment Pulses: Dorsalis Pedis Palpable: [Left:Yes] [Right:Yes] Posterior Tibial Extremity colors, hair growth, and conditions: Extremity Color: [Left:Hyperpigmented] [Right:Hyperpigmented] Temperature of Extremity: [Left:Warm] [Right:Warm] Capillary Refill: [Left:< 3 seconds] [Right:< 3 seconds] Toe Nail Assessment Left: Right: Thick: Yes Yes Discolored: Yes Yes Deformed: No No Improper Length and Hygiene: Yes Yes Electronic Signature(s) Signed: 02/14/2017 4:03:50 PM By: Alric Quan Entered By: Alric Quan on 02/14/2017 09:00:13 Brooke Hopkins  ([id number]) -------------------------------------------------------------------------------- Multi Wound Chart Details Patient Name: Brooke Hopkins. Date of Service: 02/14/2017 8:45 AM Medical Record Number: [id number] Patient Account Number: [id number] Date of Birth/Sex: [birth year] (66 y.o. Female) Treating RN: Ahmed Prima Primary Care Silvina Hackleman: FITZGERALD, DAVID Other Clinician: Referring Annel Zunker: FITZGERALD, DAVID Treating Fernande Treiber/Extender: Frann Rider in Treatment: 34 Vital Signs Height(in): 67 Pulse(bpm): 85 Weight(lbs): 300 Blood Pressure 171/79 (mmHg): Body Mass Index(BMI): 47 Temperature(F): 98.0 Respiratory Rate 18 (breaths/min): Photos: [1:No Photos] [2:No Photos] [3:No Photos] Wound Location: [1:Left Lower Leg - Posterior Right Lower Leg -] [2:Posterior] [3:Right Calcaneus] Wounding Event: [1:Gradually Appeared] [2:Gradually Appeared] [3:Pressure Injury] Primary Etiology: [1:Lymphedema] [2:Lymphedema] [3:Pressure Ulcer] Date Acquired: [1:12/31/2015] [2:12/31/2015] [3:01/24/2017]  Weeks of Treatment: [1:34] [2:34] [3:1] Wound Status: [1:Open] [2:Open] [3:Open] Clustered Wound: [1:No] [2:Yes] [3:No] Clustered Quantity: [1:N/A] [2:3] [3:N/A] Measurements L x W x D 2.5x0.5x0.1 [2:0.7x0.7x0.2] [3:1.2x1.3x0.1] (cm) Area (cm) : [1:0.982] [2:0.385] [3:1.225] Volume (cm) : [1:0.098] [2:0.077] [3:0.123] % Reduction in Area: [1:99.00%] [2:99.80%] [3:77.70%] % Reduction in Volume: 99.50% [2:99.80%] [3:88.80%] Classification: [1:Partial Thickness] [2:Partial Thickness] [3:Category/Stage II] Exudate Amount: [1:Large] [2:Large] [3:Large] Exudate Type: [1:Serous] [2:Serous] [3:Serosanguineous] Exudate Color: [1:amber] [2:amber] [3:red, brown] Foul Odor After [1:Yes] [2:Yes] [3:No] Cleansing: Odor Anticipated Due to No [2:No] [3:N/A] Product Use: Wound Margin: [1:Flat and Intact] [2:Indistinct, nonvisible] [3:Distinct, outline attached] Granulation Amount:  [1:Small (1-33%)] [2:None Present (0%)] [3:Large (67-100%)] Granulation Quality: [1:Red] [2:N/A] [3:Red, Pink] Necrotic Amount: [1:Large (67-100%)] [2:Large (67-100%)] [3:Small (1-33%)] Exposed Structures: Brooke, Hopkins ([id number]) Fascia: No Fascia: No Fascia: No Fat Layer (Subcutaneous Fat Layer (Subcutaneous Fat Layer (Subcutaneous Tissue) Exposed: No Tissue) Exposed: No Tissue) Exposed: No Tendon: No Tendon: No Muscle: No Muscle: No Joint: No Joint: No Bone: No Bone: No Limited to Skin Limited to Skin Breakdown Breakdown Epithelialization: Medium (34-66%) Small (1-33%) None Debridement: Debridement ([id number]- Debridement (50354- Debridement ([id number] [id number]) 11047) 11047) Pre-procedure 09:04 09:04 09:04 Verification/Time Out Taken: Pain Control: Lidocaine 4% Topical Lidocaine 4% Topical Lidocaine 4% Topical Solution Solution Solution Tissue Debrided: Fibrin/Slough, Exudates, Fibrin/Slough, Exudates, Fibrin/Slough, Exudates, Subcutaneous Subcutaneous Subcutaneous Level: Skin/Subcutaneous Skin/Subcutaneous Skin/Subcutaneous Tissue Tissue Tissue Debridement Area (sq 1.25 0.49 1.56 cm): Instrument: Curette Curette Curette Bleeding: Minimum Minimum Minimum Hemostasis Achieved: Pressure Pressure Pressure Procedural Pain: 0 0 0 Post Procedural Pain: 0 0 0 Debridement Treatment Procedure was tolerated Procedure was tolerated Procedure was tolerated Response: well well well Post Debridement 2.5x0.5x0.1 0.7x0.7x0.2 1.2x1.3x0.1 Measurements L x W x D (cm) Post Debridement 0.098 0.077 0.123 Volume: (cm) Post Debridement N/A N/A Category/Stage II Stage: Periwound Skin Texture: Excoriation: Yes Excoriation: Yes No Abnormalities Noted Induration: No Induration: No Callus: No Callus: No Crepitus: No Crepitus: No Rash: No Rash: No Scarring: No Scarring: No Periwound Skin Maceration: Yes Maceration: Yes Maceration: Yes Moisture: Dry/Scaly: No Dry/Scaly:  No Periwound Skin Color: Rubor: Yes Erythema: Yes No Abnormalities Noted Atrophie Blanche: No Hemosiderin Staining: Yes Cyanosis: No Rubor: Yes Ecchymosis: No Atrophie Blanche: No Erythema: No Cyanosis: No KEMIAH, BOOZ ([id number]) Hemosiderin Staining: No Ecchymosis: No Mottled: No Mottled: No Pallor: No Pallor: No Erythema Location: N/A Circumferential N/A Temperature: No Abnormality No Abnormality No Abnormality Tenderness on Yes Yes Yes Palpation: Wound Preparation: Ulcer Cleansing: Other: Ulcer Cleansing: Other: Ulcer Cleansing: soap and water soap and water Rinsed/Irrigated with Saline Topical Anesthetic Topical Anesthetic Applied: Other: lidocaine Applied: Other: lidocaine Topical Anesthetic 4% 4% Applied: Other: LIDOCAINE 4% Procedures Performed: Debridement Debridement Debridement Treatment Notes Wound #1 (Left, Posterior Lower Leg) 1. Cleansed with: Clean wound with Normal Saline Cleanse wound with antibacterial soap and water 2. Anesthetic Topical Lidocaine 4% cream to wound bed prior to debridement 4. Dressing Applied: Santyl Ointment 5. Secondary Dressing Applied ABD Pad Dry Gauze 7. Secured with Tape 3 Layer Compression System - Bilateral Wound #2 (Right, Posterior Lower Leg) 1. Cleansed with: Clean wound with Normal Saline Cleanse wound with antibacterial soap and water 2. Anesthetic Topical Lidocaine 4% cream to wound bed prior to debridement 4. Dressing Applied: Santyl Ointment 5. Secondary Dressing Applied ABD Pad Dry Gauze 7. Secured with Tape 3 Layer Compression System - Bilateral Wound #3 (Right Calcaneus) Brooke, Hopkins. ([id number]) 1. Cleansed with: Clean wound with Normal Saline Cleanse wound with antibacterial soap and water 2. Anesthetic  Topical Lidocaine 4% cream to wound bed prior to debridement 4. Dressing Applied: Aquacel Ag 5. Secondary Dressing Applied Dry Gauze Notes foam heel cup Electronic  Signature(s) Signed: 02/14/2017 9:22:28 AM By: Christin Fudge MD, FACS Entered By: Christin Fudge on 02/14/2017 09:22:28 Brooke Hopkins ([id number]) -------------------------------------------------------------------------------- [place] Details Patient Name: Brooke, Hopkins. Date of Service: 02/14/2017 8:45 AM Medical Record Number: [id number] Patient Account Number: [id number] Date of Birth/Sex: [birth year] (66 y.o. Female) Treating RN: Ahmed Prima Primary Care Emika Tiano: FITZGERALD, DAVID Other Clinician: Referring Ronae Noell: FITZGERALD, DAVID Treating Ilamae Geng/Extender: Frann Rider in Treatment: 24 Active Inactive ` Orientation to the Wound Care Program Nursing Diagnoses: Knowledge deficit related to the wound healing center program Goals: Patient/caregiver will verbalize understanding of the [place] Date Initiated: 06/21/2016 Target Resolution Date: 03/29/2017 Goal Status: Active Interventions: Provide education on orientation to the wound center Notes: ` Wound/Skin Impairment Nursing Diagnoses: Impaired tissue integrity Goals: Patient/caregiver will verbalize understanding of skin care regimen Date Initiated: 06/21/2016 Target Resolution Date: 03/29/2017 Goal Status: Active Ulcer/skin breakdown will have a volume reduction of 30% by week 4 Date Initiated: 06/21/2016 Target Resolution Date: 03/29/2017 Goal Status: Active Ulcer/skin breakdown will have a volume reduction of 50% by week 8 Date Initiated: 06/21/2016 Target Resolution Date: 03/29/2017 Goal Status: Active Ulcer/skin breakdown will have a volume reduction of 80% by week 12 Date Initiated: 06/21/2016 Target Resolution Date: 03/29/2017 Goal Status: Active Ulcer/skin breakdown will heal within 14 weeks Date Initiated: 06/21/2016 Target Resolution Date: 03/29/2017 Brooke, Hopkins ([id number]) Goal Status: Active Interventions: Assess patient/caregiver  ability to obtain necessary supplies Assess patient/caregiver ability to perform ulcer/skin care regimen upon admission and as needed Assess ulceration(s) every visit Provide education on ulcer and skin care Notes: Electronic Signature(s) Signed: 02/14/2017 4:03:50 PM By: Alric Quan Entered By: Alric Quan on 02/14/2017 09:00:18 Brooke Hopkins ([id number]) -------------------------------------------------------------------------------- Pain Assessment Details Patient Name: Brooke Hopkins. Date of Service: 02/14/2017 8:45 AM Medical Record Number: [id number] Patient Account Number: [id number] Date of Birth/Sex: Aug 17, 1951 (66 y.o. Female) Treating RN: Ahmed Prima Primary Care Babbette Dalesandro: FITZGERALD, DAVID Other Clinician: Referring Otoniel Myhand: FITZGERALD, DAVID Treating Latrise Bowland/Extender: Frann Rider in Treatment: 55 Active Problems Location of Pain Severity and Description of Pain Patient Has Paino No Site Locations With Dressing Change: No Pain Management and Medication Current Pain Management: Electronic Signature(s) Signed: 02/14/2017 4:03:50 PM By: Alric Quan Entered By: Alric Quan on 02/14/2017 08:43:18 Brooke Hopkins ([id number]) -------------------------------------------------------------------------------- Patient/Caregiver Education Details Patient Name: YOHANNA, TOW. Date of Service: 02/14/2017 8:45 AM Medical Record Number: [id number] Patient Account Number: [id number] Date of Birth/Gender: 04-11-1951 (66 y.o. Female) Treating RN: Ahmed Prima Primary Care Physician: FITZGERALD, DAVID Other Clinician: Referring Physician: FITZGERALD, DAVID Treating Physician/Extender: Frann Rider in Treatment: 46 Education Assessment Education Provided To: Patient Education Topics Provided Wound/Skin Impairment: Handouts: Other: change dressing as ordered Methods: Demonstration, Explain/Verbal Responses: State content  correctly Electronic Signature(s) Signed: 02/14/2017 4:03:50 PM By: Alric Quan Entered By: Alric Quan on 02/14/2017 09:03:34 Brooke Hopkins ([id number]) -------------------------------------------------------------------------------- Wound Assessment Details Patient Name: Brooke, Hopkins. Date of Service: 02/14/2017 8:45 AM Medical Record Number: [id number] Patient Account Number: [id number] Date of Birth/Sex: [birth year] (66 y.o. Female) Treating RN: Ahmed Prima Primary Care Dima Ferrufino: FITZGERALD, DAVID Other Clinician: Referring Godric Lavell: FITZGERALD, DAVID Treating Kiam Bransfield/Extender: Frann Rider in Treatment: 34 Wound Status Wound Number: 1 Primary Etiology: Lymphedema Wound Location: Left Lower Leg - Posterior Wound Status: Open Wounding Event: Gradually Appeared Date Acquired: 12/31/2015 Weeks Of Treatment:  34 Clustered Wound: No Photos Photo Uploaded By: Alric Quan on 02/14/2017 15:51:47 Wound Measurements Length: (cm) 2.5 Width: (cm) 0.5 Depth: (cm) 0.1 Area: (cm) 0.982 Volume: (cm) 0.098 % Reduction in Area: 99% % Reduction in Volume: 99.5% Epithelialization: Medium (34-66%) Tunneling: No Undermining: No Wound Description Classification: Partial Thickness Wound Margin: Flat and Intact Exudate Amount: Large Exudate Type: Serous Exudate Color: amber Foul Odor After Cleansing: Yes Due to Product Use: No Slough/Fibrino Yes Wound Bed Granulation Amount: Small (1-33%) Exposed Structure Granulation Quality: Red Fascia Exposed: No Necrotic Amount: Large (67-100%) Fat Layer (Subcutaneous Tissue) Exposed: No Necrotic Quality: Adherent Slough Tendon Exposed: No TANIAH, REINECKE ([id number]) Muscle Exposed: No Joint Exposed: No Bone Exposed: No Limited to Skin Breakdown Periwound Skin Texture Texture Color No Abnormalities Noted: No No Abnormalities Noted: No Callus: No Atrophie Blanche: No Crepitus: No Cyanosis:  No Excoriation: Yes Ecchymosis: No Induration: No Erythema: No Rash: No Hemosiderin Staining: No Scarring: No Mottled: No Pallor: No Moisture Rubor: Yes No Abnormalities Noted: No Dry / Scaly: No Temperature / Pain Maceration: Yes Temperature: No Abnormality Tenderness on Palpation: Yes Wound Preparation Ulcer Cleansing: Other: soap and water, Topical Anesthetic Applied: Other: lidocaine 4%, Treatment Notes Wound #1 (Left, Posterior Lower Leg) 1. Cleansed with: Clean wound with Normal Saline Cleanse wound with antibacterial soap and water 2. Anesthetic Topical Lidocaine 4% cream to wound bed prior to debridement 4. Dressing Applied: Santyl Ointment 5. Secondary Dressing Applied ABD Pad Dry Gauze 7. Secured with Tape 3 Layer Compression System - Bilateral Electronic Signature(s) Signed: 02/14/2017 4:03:50 PM By: Alric Quan Entered By: Alric Quan on 02/14/2017 08:56:47 Brooke Hopkins ([id number]) -------------------------------------------------------------------------------- Wound Assessment Details Patient Name: Brooke, Hopkins. Date of Service: 02/14/2017 8:45 AM Medical Record Number: [id number] Patient Account Number: [id number] Date of Birth/Sex: 12-28-1951 (66 y.o. Female) Treating RN: Ahmed Prima Primary Care Yatzary Merriweather: FITZGERALD, DAVID Other Clinician: Referring Providence Stivers: FITZGERALD, DAVID Treating Clancy Mullarkey/Extender: Frann Rider in Treatment: 34 Wound Status Wound Number: 2 Primary Etiology: Lymphedema Wound Location: Right Lower Leg - Posterior Wound Status: Open Wounding Event: Gradually Appeared Date Acquired: 12/31/2015 Weeks Of Treatment: 34 Clustered Wound: Yes Photos Photo Uploaded By: Alric Quan on 02/14/2017 15:52:27 Wound Measurements Length: (cm) 0.7 Width: (cm) 0.7 Depth: (cm) 0.2 Clustered Quantity: 3 Area: (cm) 0.385 Volume: (cm) 0.077 % Reduction in Area: 99.8% % Reduction in Volume:  99.8% Epithelialization: Small (1-33%) Tunneling: No Undermining: No Wound Description Classification: Partial Thickness Wound Margin: Indistinct, nonvisible Exudate Amount: Large Exudate Type: Serous Exudate Color: amber Foul Odor After Cleansing: Yes Due to Product Use: No Slough/Fibrino Yes Wound Bed Granulation Amount: None Present (0%) Exposed Structure Necrotic Amount: Large (67-100%) Fascia Exposed: No Necrotic Quality: Adherent Slough Fat Layer (Subcutaneous Tissue) Exposed: No Brooke, Hopkins ([id number]) Tendon Exposed: No Muscle Exposed: No Joint Exposed: No Bone Exposed: No Limited to Skin Breakdown Periwound Skin Texture Texture Color No Abnormalities Noted: No No Abnormalities Noted: No Callus: No Atrophie Blanche: No Crepitus: No Cyanosis: No Excoriation: Yes Ecchymosis: No Induration: No Erythema: Yes Rash: No Erythema Location: Circumferential Scarring: No Hemosiderin Staining: Yes Mottled: No Moisture Pallor: No No Abnormalities Noted: No Rubor: Yes Dry / Scaly: No Maceration: Yes Temperature / Pain Temperature: No Abnormality Tenderness on Palpation: Yes Wound Preparation Ulcer Cleansing: Other: soap and water, Topical Anesthetic Applied: Other: lidocaine 4%, Treatment Notes Wound #2 (Right, Posterior Lower Leg) 1. Cleansed with: Clean wound with Normal Saline Cleanse wound with antibacterial soap and water 2. Anesthetic Topical Lidocaine 4%  cream to wound bed prior to debridement 4. Dressing Applied: Santyl Ointment 5. Secondary Dressing Applied ABD Pad Dry Gauze 7. Secured with Tape 3 Layer Compression System - Bilateral Electronic Signature(s) Signed: 02/14/2017 4:03:50 PM By: Alric Quan Entered By: Alric Quan on 02/14/2017 08:57:20 Brooke, Hopkins ([id number]) Brooke, Hopkins ([id number]) -------------------------------------------------------------------------------- Wound Assessment  Details Patient Name: Brooke, Hopkins. Date of Service: 02/14/2017 8:45 AM Medical Record Number: [id number] Patient Account Number: [id number] Date of Birth/Sex: 01-Mar-1951 (66 y.o. Female) Treating RN: Ahmed Prima Primary Care Leighanna Kirn: FITZGERALD, DAVID Other Clinician: Referring Naidelin Gugliotta: FITZGERALD, DAVID Treating Jailah Willis/Extender: Frann Rider in Treatment: 34 Wound Status Wound Number: 3 Primary Etiology: Pressure Ulcer Wound Location: Right Calcaneus Wound Status: Open Wounding Event: Pressure Injury Date Acquired: 01/24/2017 Weeks Of Treatment: 1 Clustered Wound: No Photos Photo Uploaded By: Alric Quan on 02/14/2017 15:52:27 Wound Measurements Length: (cm) 1.2 Width: (cm) 1.3 Depth: (cm) 0.1 Area: (cm) 1.225 Volume: (cm) 0.123 % Reduction in Area: 77.7% % Reduction in Volume: 88.8% Epithelialization: None Tunneling: No Undermining: No Wound Description Classification: Category/Stage II Foul Odor Aft Wound Margin: Distinct, outline attached Slough/Fibrin Exudate Amount: Large Exudate Type: Serosanguineous Exudate Color: red, brown er Cleansing: No o No Wound Bed Granulation Amount: Large (67-100%) Exposed Structure Granulation Quality: Red, Pink Fascia Exposed: No Necrotic Amount: Small (1-33%) Fat Layer (Subcutaneous Tissue) Exposed: No Necrotic Quality: [address] Bettyanne M. ([id number]) Periwound Skin Texture Texture Color No Abnormalities Noted: No No Abnormalities Noted: No Moisture Temperature / Pain No Abnormalities Noted: No Temperature: No Abnormality Maceration: Yes Tenderness on Palpation: Yes Wound Preparation Ulcer Cleansing: Rinsed/Irrigated with Saline Topical Anesthetic Applied: Other: LIDOCAINE 4%, Treatment Notes Wound #3 (Right Calcaneus) 1. Cleansed with: Clean wound with Normal Saline Cleanse wound with antibacterial soap and water 2. Anesthetic Topical Lidocaine 4% cream to wound bed  prior to debridement 4. Dressing Applied: Aquacel Ag 5. Secondary Dressing Applied Dry Gauze Notes foam heel cup Electronic Signature(s) Signed: 02/14/2017 4:03:50 PM By: Alric Quan Entered By: Alric Quan on 02/14/2017 08:58:03 Brooke Hopkins ([id number]) -------------------------------------------------------------------------------- [place] Details Patient Name: QUINTELLA, MURA. Date of Service: 02/14/2017 8:45 AM Medical Record Number: [id number] Patient Account Number: [id number] Date of Birth/Sex: 04/28/1951 (66 y.o. Female) Treating RN: Ahmed Prima Primary Care Rusty Villella: FITZGERALD, DAVID Other Clinician: Referring Anzal Bartnick: FITZGERALD, DAVID Treating Shawnee Gambone/Extender: Frann Rider in Treatment: 34 Vital Signs Time Taken: 08:43 Temperature (F): 98.0 Height (in): 67 Pulse (bpm): 85 Weight (lbs): 300 Respiratory Rate (breaths/min): 18 Body Mass Index (BMI): 47 Blood Pressure (mmHg): 171/79 Reference Range: 80 - 120 mg / dl Electronic Signature(s) Signed: 02/14/2017 4:03:50 PM By: Alric Quan Entered By: Alric Quan on 02/14/2017 08:47:29

## 2017-02-15 NOTE — Progress Notes (Signed)
Hopkins, Brooke ([id number]) Visit Report for 02/14/2017 Chief Complaint Document Details Patient Name: Brooke Hopkins, Brooke Hopkins. Date of Service: 02/14/2017 8:45 AM Medical Record Number: [id number] Patient Account Number: [id number] Date of Birth/Sex: 08-02-1951 (66 y.o. Female) Treating RN: Ahmed Prima Primary Care Provider: FITZGERALD, DAVID Other Clinician: Referring Provider: FITZGERALD, DAVID Treating Provider/Extender: Frann Rider in Treatment: 57 Information Obtained from: Patient Chief Complaint Mrs. Randleman returns for follow-up to her bilateral lower extremity ulcers Electronic Signature(s) Signed: 02/14/2017 9:22:58 AM By: Christin Fudge MD, FACS Entered By: Christin Fudge on 02/14/2017 09:22:58 [name] ([id number]) -------------------------------------------------------------------------------- Debridement Details Patient Name: Brooke, Hopkins. Date of Service: 02/14/2017 8:45 AM Medical Record Number: [id number] Patient Account Number: [id number] Date of Birth/Sex: 08-10-1951 (66 y.o. Female) Treating RN: Ahmed Prima Primary Care Provider: FITZGERALD, DAVID Other Clinician: Referring Provider: FITZGERALD, DAVID Treating Provider/Extender: Frann Rider in Treatment: 34 Debridement Performed for Wound #1 Left,Posterior Lower Leg Assessment: Performed By: Physician Christin Fudge, MD Debridement: Debridement Pre-procedure Yes - 09:04 Verification/Time Out Taken: Start Time: 09:05 Pain Control: Lidocaine 4% Topical Solution Level: Skin/Subcutaneous Tissue Total Area Debrided (L x 2.5 (cm) x 0.5 (cm) = 1.25 (cm) W): Tissue and other Viable, Non-Viable, Exudate, Fibrin/Slough, Subcutaneous material debrided: Instrument: Curette Bleeding: Minimum Hemostasis Achieved: Pressure End Time: 09:06 Procedural Pain: 0 Post Procedural Pain: 0 Response to Treatment: Procedure was tolerated well Post Debridement Measurements of Total  Wound Length: (cm) 2.5 Width: (cm) 0.5 Depth: (cm) 0.1 Volume: (cm) 0.098 Character of Wound/Ulcer Post Requires Further Debridement Debridement: Severity of Tissue Post Debridement: Fat layer exposed Post Procedure Diagnosis Same as Pre-procedure Electronic Signature(s) Signed: 02/14/2017 9:22:37 AM By: Christin Fudge MD, FACS Signed: 02/14/2017 4:03:50 PM By: Alric Quan Entered By: Christin Fudge on 02/14/2017 09:22:37 [name] ([name], [name] ([id number]) -------------------------------------------------------------------------------- Debridement Details Patient Name: Hopkins, Brooke. Date of Service: 02/14/2017 8:45 AM Medical Record Number: [id number] Patient Account Number: [id number] Date of Birth/Sex: [birth year] (66 y.o. Female) Treating RN: Ahmed Prima Primary Care Provider: FITZGERALD, DAVID Other Clinician: Referring Provider: FITZGERALD, DAVID Treating Provider/Extender: Frann Rider in Treatment: 34 Debridement Performed for Wound #2 Right,Posterior Lower Leg Assessment: Performed By: Physician Christin Fudge, MD Debridement: Debridement Pre-procedure Yes - 09:04 Verification/Time Out Taken: Start Time: 09:09 Pain Control: Lidocaine 4% Topical Solution Level: Skin/Subcutaneous Tissue Total Area Debrided (L x 0.7 (cm) x 0.7 (cm) = 0.49 (cm) W): Tissue and other Viable, Non-Viable, Exudate, Fibrin/Slough, Subcutaneous material debrided: Instrument: Curette Bleeding: Minimum Hemostasis Achieved: Pressure End Time: 09:10 Procedural Pain: 0 Post Procedural Pain: 0 Response to Treatment: Procedure was tolerated well Post Debridement Measurements of Total Wound Length: (cm) 0.7 Width: (cm) 0.7 Depth: (cm) 0.2 Volume: (cm) 0.077 Character of Wound/Ulcer Post Requires Further Debridement Debridement: Severity of Tissue Post Debridement: Fat layer exposed Post Procedure Diagnosis Same as  Pre-procedure Electronic Signature(s) Signed: 02/14/2017 9:22:43 AM By: Christin Fudge MD, FACS Signed: 02/14/2017 4:03:50 PM By: Alric Quan Entered By: Christin Fudge on 02/14/2017 09:22:43 [name] ([name], [name] ([id number]) -------------------------------------------------------------------------------- Debridement Details Patient Name: Hopkins, Brooke. Date of Service: 02/14/2017 8:45 AM Medical Record Number: [id number] Patient Account Number: [id number] Date of Birth/Sex: May 01, 1951 (66 y.o. Female) Treating RN: Ahmed Prima Primary Care Provider: FITZGERALD, DAVID Other Clinician: Referring Provider: FITZGERALD, DAVID Treating Provider/Extender: Frann Rider in Treatment: 34 Debridement Performed for Wound #3 Right Calcaneus Assessment: Performed By: Physician Christin Fudge, MD Debridement: Debridement Pre-procedure Yes - 09:04 Verification/Time Out Taken: Start Time: 09:07 Pain Control:  Lidocaine 4% Topical Solution Level: Skin/Subcutaneous Tissue Total Area Debrided (L x 1.2 (cm) x 1.3 (cm) = 1.56 (cm) W): Tissue and other Viable, Non-Viable, Exudate, Fibrin/Slough, Subcutaneous material debrided: Instrument: Curette Bleeding: Minimum Hemostasis Achieved: Pressure End Time: 09:08 Procedural Pain: 0 Post Procedural Pain: 0 Response to Treatment: Procedure was tolerated well Post Debridement Measurements of Total Wound Length: (cm) 1.2 Stage: Category/Stage II Width: (cm) 1.3 Depth: (cm) 0.1 Volume: (cm) 0.123 Character of Wound/Ulcer Post Requires Further Debridement: Debridement Severity of Tissue Post Fat layer exposed Debridement: Post Procedure Diagnosis Same as Pre-procedure Electronic Signature(s) Signed: 02/14/2017 9:22:50 AM By: Christin Fudge MD, FACS Signed: 02/14/2017 4:03:50 PM By: Alric Quan Entered By: Christin Fudge on 02/14/2017 09:22:50 [name] ([id number]) [name], [name]  ([id number]) -------------------------------------------------------------------------------- HPI Details Patient Name: Hopkins, Brooke. Date of Service: 02/14/2017 8:45 AM Medical Record Number: [id number] Patient Account Number: [id number] Date of Birth/Sex: 1951/09/12 (66 y.o. Female) Treating RN: Ahmed Prima Primary Care Provider: FITZGERALD, DAVID Other Clinician: Referring Provider: FITZGERALD, DAVID Treating Provider/Extender: Frann Rider in Treatment: 34 History of Present Illness Location: massive swelling of both lower extremities and ulceration both lower extremities Quality: Patient reports experiencing a dull pain to affected area(s). Severity: Patient states wound are getting worse. Duration: Patient has had the wound for >2 years prior to seeking treatment at the wound center Timing: Pain in wound is constant (hurts all the time) Context: The wound appeared gradually over time Modifying Factors: Other treatment(s) tried include:she has a lymphedema pump but uses it seldom and she's had several course of antibiotics Associated Signs and Symptoms: Patient reports having difficulty standing for long periods. HPI Description: 66 year old patient seen by Dr. Ola Spurr of infectious disease who has been following up for left lower extremity cellulitis and ulcer with recurrent bilateral lower extremity problems for several months. Recently she had a large right lower extremity bullae which opened out and has been ulcerated. She has seen the vascular group and has been getting Unna's wraps and has a lymphedema pump used in the past. Her prior cultures were positive for Pseudomonas, Proteus and was treated with amoxicillin. He has also been treated with 2 weeks course of ciprofloxacin and amoxicillin.. Increase of Lasix dose helped with the edema and echo showed no systolic CHF but may be diastolic problems. Past medical history significant for diabetes mellitus type  2, venous stasis ulcer, obesity, diabetic peripheral neuropathy, status post back surgery, cholecystectomy, hysterectomy, arthroscopy of the knee. He is a former smoker and quit smoking in 1984. The patient has seen Dr. Delana Meyer who did not recommend any arterial or venous duplex studies and has been using Unna's wraps and also recommended a lymphedema pump. she has been very noncompliant with using these. 06/28/2016 -- the patient is still on antibiotics as prescribed by Dr. Ola Spurr and he is asked her to take it for 3 weeks. The patient also says she has a lot of redness and pain in the folds of her thigh and lower extremity and this is very painful. 07/26/2016 -- the patient is off antibiotics and has been getting dressing changes 3 times a week. 08/09/2016 -- is been on Cipro and is taking potassium supplements along with her Lasix and is going to be seeing Dr. Ola Spurr for a consult return visit only in November 2017. 08/23/2016 -- she was admitted to the hospital last week and I have reviewed these reports in detail. She was seen by Dr. Ola Spurr who noted a Pseudomonas infection which  was resistant to ciprofloxacin and discharged her on Ceftazidime 1 g IV every 12 hourly anyone days. The antibiotic was to be stopped on September 11 and he would see her back in the clinic. 08/30/2016 -- had a communication from Dr. Ola Spurr that he would extend her antibiotics by a week if she continued to look like cellulitis was persisting. ELODIE, PANAMENO ([id number]) 09/13/2016 -- the PICC line is out and antibiotics have stopped. 10/04/16: returns today for f/u. reports utilizes compression pump twice daily. she is compliant with her compression therapy. she reports a new blister on the right proximal posterior calf. no systemic s/s of infection. 10/11/16: returns today for f/u. she reports adherence to her compression pumps, but there is no improvement regarding her BLE edema. there is  weeping of fluid. denies fever, chills, body aches or malaise. 10/18/2016 -- she has a significant cellulitis of her right lower extremity which was not there last week. I believe at this stage she will need the expertise of Dr. Ola Spurr to decide on antibiotic coverage. 10/25/2016 -- the patient has had cipro and ampicillin been started by Dr. Ola Spurr and he is awaiting further cultures. Overall she's been feeling better. 11/08/2016 -- Dr. Ola Spurr this week who has continued with ciprofloxacin and amoxicillin and is awaiting further cultures before deciding to place her on a PICC line. 11/15/2016 -- Dr. Blane Ohara note was reviewed and her cultures growing Pseudomonas in both legs which is sensitive to Cipro and the other is resistant to Cipro and she would get a PICC line and start IV antibiotics -- Ceftazidime 1 g q 12 for 3 weeks. 11-29-16 Mrs. Rammel presents today in follow-up of her bilateral lower exterminators ulcers. She remains on IV antibiotic therapy via a PICC line per Dr. Ola Spurr; she has a follow-up appointment with him on Monday, 12/02/2016. She anticipates that the about therapy will be discontinued at that appointment. She denies any complications of nausea vomiting and/or diarrhea accompanied by this antibiotic therapy. She admits to using her lymphedema pumps twice daily with the exception of yesterday. She denies any concerns or complications with the current treatment plan. 12/06/2016 -- Dr. Ola Spurr saw her recently on 12/02/2016 and due to the fact she has had significant problems with recurrent ulceration and Pseudomonas infection he recommended 3 more weeks of antibiotics and extended it until December 25. The PICC line will be in place. Last hemoglobin A1c on November 30 was 9% 12/27/2016 -- because of the holidays the patient's diet has been higher in salt, her dressings have not been done as required and she is not used to lymphedema pumps  appropriately. This has led to her lymphedema increasing markedly. 01/24/2017 -- the patient's hospital bed is malfunctioning and hence her legs have been flat in bed and her lymphedema is increased markedly. During my examination I also noted a unstageable pressure injury to her right heel 02/07/2017 -- she has returned after 2 weeks as she was done with influenza and has been in bed a lot, with a new bed which has helped her reduce the edema due to proper elevation. Will she has a full-fledged large pressure injury to the right heel. Electronic Signature(s) Signed: 02/14/2017 9:23:02 AM By: Christin Fudge MD, FACS Entered By: Christin Fudge on 02/14/2017 09:23:01 [name] ([id number]) -------------------------------------------------------------------------------- Physical Exam Details Patient Name: JOZALYN, BAGLIO. Date of Service: 02/14/2017 8:45 AM Medical Record Number: [id number] Patient Account Number: [id number] Date of Birth/Sex: [birth year] (67 y.o. Female) Treating RN: Carolyne Fiscal, Debi  Primary Care Provider: FITZGERALD, DAVID Other Clinician: Referring Provider: FITZGERALD, DAVID Treating Provider/Extender: Frann Rider in Treatment: 34 Constitutional . Pulse regular. Respirations normal and unlabored. Afebrile. . Eyes Nonicteric. Reactive to light. Ears, Nose, Mouth, and Throat Lips, teeth, and gums WNL.Marland Kitchen Moist mucosa without lesions. Neck supple and nontender. No palpable supraclavicular or cervical adenopathy. Normal sized without goiter. Respiratory WNL. No retractions.. Breath sounds WNL, No rubs, rales, rhonchi, or wheeze.. Cardiovascular Heart rhythm and rate regular, no murmur or gallop.. Pedal Pulses WNL. No clubbing, cyanosis or edema. Chest Breasts symmetical and no nipple discharge.. Breast tissue WNL, no masses, lumps, or tenderness.. Lymphatic No adneopathy. No adenopathy. No adenopathy. Musculoskeletal Adexa without tenderness or  enlargement.. Digits and nails w/o clubbing, cyanosis, infection, petechiae, ischemia, or inflammatory conditions.. Integumentary (Hair, Skin) No suspicious lesions. No crepitus or fluctuance. No peri-wound warmth or erythema. No masses.Marland Kitchen Psychiatric Judgement and insight Intact.. No evidence of depression, anxiety, or agitation.. Notes the lymphedema has still stayed down and all 3 wounds were sharply debrided with a #3 curet and bleeding controlled with pressure. Subcutaneous debris was removed and the granulation tissue under this looks healthy Electronic Signature(s) Signed: 02/14/2017 9:23:43 AM By: Christin Fudge MD, FACS Entered By: Christin Fudge on 02/14/2017 09:23:43 [name] ([id number]) -------------------------------------------------------------------------------- Physician Orders Details Patient Name: KEIRSTON, SAEPHANH. Date of Service: 02/14/2017 8:45 AM Medical Record Number: [id number] Patient Account Number: [id number] Date of Birth/Sex: Jan 07, 1951 (66 y.o. Female) Treating RN: Ahmed Prima Primary Care Provider: FITZGERALD, DAVID Other Clinician: Referring Provider: FITZGERALD, DAVID Treating Provider/Extender: Frann Rider in Treatment: 69 Verbal / Phone Orders: Yes Clinician: Carolyne Fiscal, Debi Read Back and Verified: Yes Diagnosis Coding Wound Cleansing Wound #1 Left,Posterior Lower Leg o Clean wound with Normal Saline. - clinic use o Cleanse wound with mild soap and water - HHRN please scrub wounds with mild soap and water o May Shower, gently pat wound dry prior to applying new dressing. o May shower with protection. Wound #2 Right,Posterior Lower Leg o Clean wound with Normal Saline. - clinic use o Cleanse wound with mild soap and water - HHRN please scrub wounds with mild soap and water o May Shower, gently pat wound dry prior to applying new dressing. o May shower with protection. Wound #3 Right Calcaneus o Clean  wound with Normal Saline. - clinic use o Cleanse wound with mild soap and water - HHRN please scrub wounds with mild soap and water o May Shower, gently pat wound dry prior to applying new dressing. o May shower with protection. Anesthetic Wound #1 Left,Posterior Lower Leg o Topical Lidocaine 4% cream applied to wound bed prior to debridement - clinic use Wound #2 Right,Posterior Lower Leg o Topical Lidocaine 4% cream applied to wound bed prior to debridement - clinic use Wound #3 Right Calcaneus o Topical Lidocaine 4% cream applied to wound bed prior to debridement - clinic use Primary Wound Dressing Wound #1 Left,Posterior Lower Leg o Santyl Ointment Wound #2 Right,Posterior Lower Leg o Santyl Ointment - use on the posterior leg wounds Wound #3 Right Calcaneus CALLAHAN, PEDDIE ([id number]) o Aquacel Ag Secondary Dressing Wound #1 Left,Posterior Lower Leg o ABD pad o Dry Gauze Wound #2 Right,Posterior Lower Leg o ABD pad o Dry Gauze Wound #3 Right Calcaneus o Dry Gauze o Other - FOAM HEEL CUP Dressing Change Frequency Wound #1 Left,Posterior Lower Leg o Change Dressing Monday, Wednesday, Friday - HHRN to change wraps Monday and Wednesday Wound #2 Right,Posterior Lower Leg o Change  Dressing Monday, Wednesday, Friday - HHRN to change wraps Monday and Wednesday o Change Dressing Monday, Wednesday, Friday - HHRN to change wraps Monday and Wednesday Follow-up Appointments Wound #1 Left,Posterior Lower Leg o Return Appointment in 1 week. Wound #2 Right,Posterior Lower Leg o Return Appointment in 1 week. Edema Control Wound #1 Left,Posterior Lower Leg o 3 Layer Compression System - Bilateral - May anchor top of wrap with dome paste, but only wrap around leg one time. o Elevate legs to the level of the heart and pump ankles as often as possible o Compression Pump: Use compression pump on left lower extremity for 30 minutes,  twice daily. o Compression Pump: Use compression pump on right lower extremity for 30 minutes, twice daily. Wound #2 Right,Posterior Lower Leg o 3 Layer Compression System - Bilateral - May anchor top of wrap with dome paste, but only wrap around leg one time. o Elevate legs to the level of the heart and pump ankles as often as possible o Compression Pump: Use compression pump on left lower extremity for 30 minutes, twice daily. CONCETTA, GUION. ([id number]) o Compression Pump: Use compression pump on right lower extremity for 30 minutes, twice daily. Additional Orders / Instructions o Increase protein intake. Wound #2 Right,Posterior Lower Leg o Increase protein intake. Home Health Wound #1 [place] Visits - [place] Nurse may visit PRN to address patientos wound care needs. o FACE TO FACE ENCOUNTER: MEDICARE and MEDICAID PATIENTS: I certify that this patient is under my care and that I had a face-to-face encounter that meets the physician face-to-face encounter requirements with this patient on this date. The encounter with the patient was in whole or in part for the following MEDICAL CONDITION: (primary reason for [place]) MEDICAL NECESSITY: I certify, that based on my findings, NURSING services are a medically necessary home health service. HOME BOUND STATUS: I certify that my clinical findings support that this patient is homebound (i.e., Due to illness or injury, pt requires aid of supportive devices such as crutches, cane, wheelchairs, walkers, the use of special transportation or the assistance of another person to leave their place of residence. There is a normal inability to leave the home and doing so requires considerable and taxing effort. Other absences are for medical reasons / religious services and are infrequent or of short duration when for other reasons). o Please direct any  NON-WOUND related issues/requests for orders to patient's Primary Care Physician Wound #2 [place] Visits - [place] Nurse may visit PRN to address patientos wound care needs. o FACE TO FACE ENCOUNTER: MEDICARE and MEDICAID PATIENTS: I certify that this patient is under my care and that I had a face-to-face encounter that meets the physician face-to-face encounter requirements with this patient on this date. The encounter with the patient was in whole or in part for the following MEDICAL CONDITION: (primary reason for [place]) MEDICAL NECESSITY: I certify, that based on my findings, NURSING services are a medically necessary home health service. HOME BOUND STATUS: I certify that my clinical findings support that this patient is homebound (i.e., Due to illness or injury, pt requires aid of supportive devices such as crutches, cane, wheelchairs, walkers, the use of special transportation or the assistance of another person to leave their place of residence. There is a normal inability to leave the home and doing so requires considerable and taxing effort. Other absences are  for medical reasons / religious services and are infrequent or of short duration when for other reasons). o Please direct any NON-WOUND related issues/requests for orders to patient's Primary Care Physician Electronic Signature(s) Signed: 02/14/2017 3:53:40 PM By: Christin Fudge MD, FACS FREDDA, CLARIDA ([id number]) Signed: 02/14/2017 4:03:50 PM By: Alric Quan Entered By: Alric Quan on 02/14/2017 09:45:28 [name] ([id number]) -------------------------------------------------------------------------------- Problem List Details Patient Name: SHEKIRA, DRUMMER. Date of Service: 02/14/2017 8:45 AM Medical Record Number: [id number] Patient Account Number: [id number] Date of Birth/Sex: 1951/12/18 (66 y.o. 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 Date of Service: 02/14/2017 8:45 AM Medical Record Number: [id number] Patient Account Number: [id number] Date of Birth/Sex: 05-Dec-1951 (66 y.o. Female) Treating RN: Ahmed Prima Primary Care Provider: FITZGERALD, DAVID Other Clinician: Referring Provider: FITZGERALD, DAVID Treating Provider/Extender: Frann Rider in Treatment: 10 Subjective Chief Complaint Information obtained from Patient Mrs. Ey returns for follow-up to her bilateral lower extremity ulcers History of Present Illness (HPI) The following HPI elements were documented for the patient's wound: Location: massive swelling of both lower extremities and ulceration both lower extremities Quality: Patient reports experiencing a dull pain to affected area(s). Severity: Patient states wound are getting worse. Duration: Patient has had the wound for >2 years prior to seeking treatment at the wound  center Timing: Pain in wound is constant (hurts all the time) Context: The wound appeared gradually over time Modifying Factors: Other treatment(s) tried include:she has a lymphedema pump but uses it seldom and she's had several course of antibiotics Associated Signs and Symptoms: Patient reports having difficulty standing for long periods. 66 year old patient seen by Dr. Ola Spurr of infectious disease who has been following up for left lower extremity cellulitis and ulcer with recurrent bilateral lower extremity problems for several months. Recently she had a large right lower extremity bullae which opened out and has been ulcerated. She has seen the vascular group and has been getting Unna's wraps and has a lymphedema pump used in the past. Her prior cultures were positive for Pseudomonas, Proteus and was treated with amoxicillin. He has also been treated with 2 weeks course of ciprofloxacin and amoxicillin.. Increase of Lasix dose helped with the edema and echo showed no systolic CHF but may be diastolic problems. Past medical history significant for diabetes mellitus type 2, venous stasis ulcer, obesity, diabetic peripheral neuropathy, status post back surgery, cholecystectomy, hysterectomy, arthroscopy of the knee. He is a former smoker and quit smoking in 1984. The patient has seen Dr. Delana Meyer who did not recommend any arterial or venous duplex studies and has been using Unna's wraps and also recommended a lymphedema pump. she has been very noncompliant with using these. 06/28/2016 -- the patient is still on antibiotics as prescribed by Dr. Ola Spurr and he is  asked her to take it for 3 weeks. The patient also says she has a lot of redness and pain in the folds of her thigh and lower extremity and this is very painful. 07/26/2016 -- the patient is off antibiotics and has been getting dressing changes 3 times a week. 08/09/2016 -- is been on Cipro and is taking potassium supplements  along with her Lasix and is going to be seeing Dr. Ola Spurr for a consult return visit only in November 2017. DANIYAH, FOHL ([id number]) 08/23/2016 -- she was admitted to the hospital last week and I have reviewed these reports in detail. She was seen by Dr. Ola Spurr who noted a Pseudomonas infection which was resistant to ciprofloxacin and discharged her on Ceftazidime 1 g IV every 12 hourly anyone days. The antibiotic was to be stopped on September 11 and he would see her back in the clinic. 08/30/2016 -- had a communication from Dr. Ola Spurr that he would extend her antibiotics by a week if she continued to look like cellulitis was persisting. 09/13/2016 -- the PICC line is out and antibiotics have stopped. 10/04/16: returns today for f/u. reports utilizes compression pump twice daily. she is compliant with her compression therapy. she reports a new blister on the right proximal posterior calf. no systemic s/s of infection. 10/11/16: returns today for f/u. she reports adherence to her compression pumps, but there is no improvement regarding her BLE edema. there is weeping of fluid. denies fever, chills, body aches or malaise. 10/18/2016 -- she has a significant cellulitis of her right lower extremity which was not there last week. I believe at this stage she will need the expertise of Dr. Ola Spurr to decide on antibiotic coverage. 10/25/2016 -- the patient has had cipro and ampicillin been started by Dr. Ola Spurr and he is awaiting further cultures. Overall she's been feeling better. 11/08/2016 -- Dr. Ola Spurr this week who has continued with ciprofloxacin and amoxicillin and is awaiting further cultures before deciding to place her on a PICC line. 11/15/2016 -- Dr. Blane Ohara note was reviewed and her cultures growing Pseudomonas in both legs which is sensitive to Cipro and the other is resistant to Cipro and she would get a PICC line and start IV antibiotics --  Ceftazidime 1 g q 12 for 3 weeks. 11-29-16 Mrs. Marcellus presents today in follow-up of her bilateral lower exterminators ulcers. She remains on IV antibiotic therapy via a PICC line per Dr. Ola Spurr; she has a follow-up appointment with him on Monday, 12/02/2016. She anticipates that the about therapy will be discontinued at that appointment. She denies any complications of nausea vomiting and/or diarrhea accompanied by this antibiotic therapy. She admits to using her lymphedema pumps twice daily with the exception of yesterday. She denies any concerns or complications with the current treatment plan. 12/06/2016 -- Dr. Ola Spurr saw her recently on 12/02/2016 and due to the fact she has had significant problems with recurrent ulceration and Pseudomonas infection he recommended 3 more weeks of antibiotics and extended it until December 25. The PICC line will be in place. Last hemoglobin A1c on November 30 was 9% 12/27/2016 -- because of the holidays the patient's diet has been higher in salt, her dressings have not been done as required and she is not used to lymphedema pumps appropriately. This has led to her lymphedema increasing markedly. 01/24/2017 -- the patient's hospital bed is malfunctioning and hence her legs have been flat in bed and her lymphedema is increased markedly. During my examination I also  noted a unstageable pressure injury to her right heel 02/07/2017 -- she has returned after 2 weeks as she was done with influenza and has been in bed a lot, with a new bed which has helped her reduce the edema due to proper elevation. Will she has a full-fledged large pressure injury to the right heel. LOWELLA, KINDLEY. ([id number]) Objective Constitutional Pulse regular. Respirations normal and unlabored. Afebrile. Vitals Time Taken: 8:43 AM, Height: 67 in, Weight: 300 lbs, BMI: 47, Temperature: 98.0 F, Pulse: 85 bpm, Respiratory Rate: 18 breaths/min, Blood Pressure: 171/79  mmHg. Eyes Nonicteric. Reactive to light. Ears, Nose, Mouth, and Throat Lips, teeth, and gums WNL.Marland Kitchen Moist mucosa without lesions. Neck supple and nontender. No palpable supraclavicular or cervical adenopathy. Normal sized without goiter. Respiratory WNL. No retractions.. Breath sounds WNL, No rubs, rales, rhonchi, or wheeze.. Cardiovascular Heart rhythm and rate regular, no murmur or gallop.. Pedal Pulses WNL. No clubbing, cyanosis or edema. Chest Breasts symmetical and no nipple discharge.. Breast tissue WNL, no masses, lumps, or tenderness.. Lymphatic No adneopathy. No adenopathy. No adenopathy. Musculoskeletal Adexa without tenderness or enlargement.. Digits and nails w/o clubbing, cyanosis, infection, petechiae, ischemia, or inflammatory conditions.Marland Kitchen Psychiatric Judgement and insight Intact.. No evidence of depression, anxiety, or agitation.. General Notes: the lymphedema has still stayed down and all 3 wounds were sharply debrided with a #3 curet and bleeding controlled with pressure. Subcutaneous debris was removed and the granulation tissue under this looks healthy Integumentary (Hair, Skin) No suspicious lesions. No crepitus or fluctuance. No peri-wound warmth or erythema. No masses.. Wound #1 status is Open. Original cause of wound was Gradually Appeared. The wound is located on the Left,Posterior Lower Leg. The wound measures 2.5cm length x 0.5cm width x 0.1cm depth; 0.982cm^2 ROMONA, MURDY. ([id number]) area and 0.098cm^3 volume. The wound is limited to skin breakdown. There is no tunneling or undermining noted. There is a large amount of serous drainage noted. The wound margin is flat and intact. There is small (1-33%) red granulation within the wound bed. There is a large (67-100%) amount of necrotic tissue within the wound bed including Adherent Slough. The periwound skin appearance exhibited: Excoriation, Maceration, Rubor. The periwound skin appearance did not  exhibit: Callus, Crepitus, Induration, Rash, Scarring, Dry/Scaly, Atrophie Blanche, Cyanosis, Ecchymosis, Hemosiderin Staining, Mottled, Pallor, Erythema. Periwound temperature was noted as No Abnormality. The periwound has tenderness on palpation. Wound #2 status is Open. Original cause of wound was Gradually Appeared. The wound is located on the Right,Posterior Lower Leg. The wound measures 0.7cm length x 0.7cm width x 0.2cm depth; 0.385cm^2 area and 0.077cm^3 volume. The wound is limited to skin breakdown. There is no tunneling or undermining noted. There is a large amount of serous drainage noted. The wound margin is indistinct and nonvisible. There is no granulation within the wound bed. There is a large (67-100%) amount of necrotic tissue within the wound bed including Adherent Slough. The periwound skin appearance exhibited: Excoriation, Maceration, Hemosiderin Staining, Rubor, Erythema. The periwound skin appearance did not exhibit: Callus, Crepitus, Induration, Rash, Scarring, Dry/Scaly, Atrophie Blanche, Cyanosis, Ecchymosis, Mottled, Pallor. The surrounding wound skin color is noted with erythema which is circumferential. Periwound temperature was noted as No Abnormality. The periwound has tenderness on palpation. Wound #3 status is Open. Original cause of wound was Pressure Injury. The wound is located on the Right Calcaneus. The wound measures 1.2cm length x 1.3cm width x 0.1cm depth; 1.225cm^2 area and 0.123cm^3 volume. There is no tunneling or undermining noted. There is a  large amount of serosanguineous drainage noted. The wound margin is distinct with the outline attached to the wound base. There is large (67-100%) red, pink granulation within the wound bed. There is a small (1-33%) amount of necrotic tissue within the wound bed including Adherent Slough. The periwound skin appearance exhibited: Maceration. Periwound temperature was noted as No Abnormality. The periwound has  tenderness on palpation. 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 ([id number]) Wound #1 Wound #1 is a Lymphedema located on the Left,Posterior Lower Leg . There was a Skin/Subcutaneous Tissue Debridement ([id number]) debridement with total area of 1.25 sq cm performed by Christin Fudge, MD. with the following instrument(s): Curette to remove Viable and Non-Viable tissue/material including Exudate, Fibrin/Slough, and Subcutaneous after achieving pain control using Lidocaine 4% Topical Solution. A time out was conducted at 09:04, prior to the start of the procedure. A Minimum amount of bleeding was controlled with Pressure. The procedure was tolerated well with a pain level of 0 throughout and a pain level of 0 following the procedure. Post Debridement Measurements: 2.5cm length x 0.5cm width x 0.1cm depth; 0.098cm^3 volume. Character of Wound/Ulcer Post Debridement requires further debridement. Severity of Tissue Post Debridement is: Fat layer exposed. Post procedure Diagnosis Wound #1: Same as Pre-Procedure Wound #2 Wound #2 is a Lymphedema located on the Right,Posterior Lower Leg . There was a Skin/Subcutaneous Tissue Debridement ([id number]) debridement with total area of 0.49 sq cm performed by Christin Fudge, MD. with the following instrument(s): Curette to remove Viable and Non-Viable tissue/material including Exudate, Fibrin/Slough, and Subcutaneous after achieving pain control using Lidocaine 4% Topical Solution. A time out was conducted at 09:04, prior to the start of the procedure. A Minimum amount  of bleeding was controlled with Pressure. The procedure was tolerated well with a pain level of 0 throughout and a pain level of 0 following the procedure. Post Debridement Measurements: 0.7cm length x 0.7cm width x 0.2cm depth; 0.077cm^3 volume. Character of Wound/Ulcer Post Debridement requires further debridement. Severity of Tissue Post Debridement is: Fat layer exposed. Post procedure Diagnosis Wound #2: Same as Pre-Procedure Wound #3 Wound #3 is a Pressure Ulcer located on the Right Calcaneus . There was a Skin/Subcutaneous Tissue Debridement ([id number]) debridement with total area of 1.56 sq cm performed by Christin Fudge, MD. with the following instrument(s): Curette to remove Viable and Non-Viable tissue/material including Exudate, Fibrin/Slough, and Subcutaneous after achieving pain control using Lidocaine 4% Topical Solution. A time out was conducted at 09:04, prior to the start of the procedure. A Minimum amount of bleeding was controlled with Pressure. The procedure was tolerated well with a pain level of 0 throughout and a pain level of 0 following the procedure. Post Debridement Measurements: 1.2cm length x 1.3cm width x 0.1cm depth; 0.123cm^3 volume. Post debridement Stage noted as Category/Stage II. Character of Wound/Ulcer Post Debridement requires further debridement. Severity of Tissue Post Debridement is: Fat layer exposed. Post procedure Diagnosis Wound #3: Same as Pre-Procedure Plan Wound Cleansing: MANILLA, STRIETER. ([id number]) Wound #1 Left,Posterior Lower Leg: Clean wound with Normal Saline. - clinic use Cleanse wound with mild soap and water - HHRN please scrub wounds with mild soap and  water May Shower, gently pat wound dry prior to applying new dressing. May shower with protection. Wound #2 Right,Posterior Lower Leg: Clean wound with Normal Saline. - clinic use Cleanse wound with mild soap and water - HHRN please scrub wounds with mild soap and water May  Shower, gently pat wound dry prior to applying new dressing. May shower with protection. Wound #3 Right Calcaneus: Clean wound with Normal Saline. - clinic use Cleanse wound with mild soap and water - HHRN please scrub wounds with mild soap and water May Shower, gently pat wound dry prior to applying new dressing. May shower with protection. Anesthetic: Wound #1 Left,Posterior Lower Leg: Topical Lidocaine 4% cream applied to wound bed prior to debridement - clinic use Wound #2 Right,Posterior Lower Leg: Topical Lidocaine 4% cream applied to wound bed prior to debridement - clinic use Wound #3 Right Calcaneus: Topical Lidocaine 4% cream applied to wound bed prior to debridement - clinic use Primary Wound Dressing: Wound #1 Left,Posterior Lower Leg: Santyl Ointment Wound #2 Right,Posterior Lower Leg: Santyl Ointment - use on the posterior leg wounds Wound #3 Right Calcaneus: Aquacel Ag Secondary Dressing: Wound #1 Left,Posterior Lower Leg: ABD pad Dry Gauze Wound #2 Right,Posterior Lower Leg: ABD pad Dry Gauze Wound #3 Right Calcaneus: Dry Gauze Other - FOAM HEEL CUP Dressing Change Frequency: Wound #1 Left,Posterior Lower Leg: Change Dressing Monday, Wednesday, Friday - HHRN to change wraps Monday and Wednesday Wound #2 Right,Posterior Lower Leg: Change Dressing Monday, Wednesday, Friday - HHRN to change wraps Monday and Wednesday Change Dressing Monday, Wednesday, Friday - HHRN to change wraps Monday and Wednesday Follow-up Appointments: Wound #1 Left,Posterior Lower Leg: Return Appointment in 1 week. Wound #2 Right,Posterior Lower Leg: Return Appointment in 1 week. Edema Control: GRAYSON, WHITE ([id number]) Wound #1 Left,Posterior Lower Leg: 3 Layer Compression System - Bilateral - May anchor top of wrap with dome paste, but only wrap around leg one time. Elevate legs to the level of the heart and pump ankles as often as possible Compression Pump: Use compression  pump on left lower extremity for 30 minutes, twice daily. Compression Pump: Use compression pump on right lower extremity for 30 minutes, twice daily. Wound #2 Right,Posterior Lower Leg: 3 Layer Compression System - Bilateral - May anchor top of wrap with dome paste, but only wrap around leg one time. Elevate legs to the level of the heart and pump ankles as often as possible Compression Pump: Use compression pump on left lower extremity for 30 minutes, twice daily. Compression Pump: Use compression pump on right lower extremity for 30 minutes, twice daily. Additional Orders / Instructions: Increase protein intake. Wound #2 Right,Posterior Lower Leg: Increase protein intake. Home Health: Wound #1 Left,Posterior Lower Leg: Continue Home Health Visits - [place] Nurse may visit PRN to address patient s wound care needs. FACE TO FACE ENCOUNTER: MEDICARE and MEDICAID PATIENTS: I certify that this patient is under my care and that I had a face-to-face encounter that meets the physician face-to-face encounter requirements with this patient on this date. The encounter with the patient was in whole or in part for the following MEDICAL CONDITION: (primary reason for [place]) MEDICAL NECESSITY: I certify, that based on my findings, NURSING services are a medically necessary home health service. HOME BOUND STATUS: I certify that my clinical findings support that this patient is homebound (i.e., Due to illness or injury, pt requires aid of supportive devices such as crutches, cane, wheelchairs, walkers, the use of special transportation  or the assistance of another person to leave their place of residence. There is a normal inability to leave the home and doing so requires considerable and taxing effort. Other absences are for medical reasons / religious services and are infrequent or of short duration when for other reasons). Please direct any NON-WOUND related issues/requests for  orders to patient's Primary Care Physician Wound #2 Right,Posterior Lower Leg: [place] Visits - [place] Nurse may visit PRN to address patient s wound care needs. FACE TO FACE ENCOUNTER: MEDICARE and MEDICAID PATIENTS: I certify that this patient is under my care and that I had a face-to-face encounter that meets the physician face-to-face encounter requirements with this patient on this date. The encounter with the patient was in whole or in part for the following MEDICAL CONDITION: (primary reason for [place]) MEDICAL NECESSITY: I certify, that based on my findings, NURSING services are a medically necessary home health service. HOME BOUND STATUS: I certify that my clinical findings support that this patient is homebound (i.e., Due to illness or injury, pt requires aid of supportive devices such as crutches, cane, wheelchairs, walkers, the use of special transportation or the assistance of another person to leave their place of residence. There is a normal inability to leave the home and doing so requires considerable and taxing effort. Other absences are for medical reasons / religious services and are infrequent or of short duration when for other reasons). Please direct any NON-WOUND related issues/requests for orders to patient's Primary Care Physician ALMENA, HOKENSON. ([id number]) I have recommended: 1. silver alginate and drawtex and a 3 layer Profore to both lower extremities. 2. She will continue with elevation and exercise and her lymphedema pumps -- I had a long serious discussion with her regarding the importance of this 3. Santyl to be applied at the posterior part of her right and left calf wounds. 4. changing her dressings twice a week. 5. right heel will need silver alginate, be protected with a heel cup and again offloading techniques have been discussed in great detail 6. I have had a detailed discussion with them, asked to be compliant  with the appropriate dietary restriction, elevation and use of her lymphedema pumps. Electronic Signature(s) Signed: 02/14/2017 3:57:31 PM By: Christin Fudge MD, FACS Previous Signature: 02/14/2017 3:56:51 PM Version By: Christin Fudge MD, FACS Previous Signature: 02/14/2017 9:24:58 AM Version By: Christin Fudge MD, FACS Entered By: Christin Fudge on 02/14/2017 15:57:31 [name] ([id number]) -------------------------------------------------------------------------------- SuperBill Details Patient Name: NERA, HAWORTH. Date of Service: 02/14/2017 Medical Record Number: [id number] Patient Account Number: [id number] Date of Birth/Sex: 1951-04-29 (66 y.o. 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 E66.01 Morbid (severe) obesity due to excess calories L89.612 Pressure ulcer of right heel, stage 2 Facility Procedures CPT4 Code: [id number] Description: 88416 - DEB SUBQ TISSUE 20 SQ CM/< ICD-10 Description Diagnosis E11.622 Type 2 diabetes mellitus with other skin ulcer I89.0 Lymphedema, not elsewhere classified L97.222 Non-pressure chronic ulcer of left calf with fat l 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 Modifier: ayer exposed layer exposed Quantity: 1 Physician Procedures CPT4 Code: [id number]  Description: 11042 - WC PHYS SUBQ TISS 20 SQ CM ICD-10 Description Diagnosis E11.622 Type 2 diabetes mellitus with other skin ulcer I89.0 Lymphedema, not elsewhere classified L97.222 Non-pressure chronic ulcer of  left calf with fat l 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 Modifier: ayer exposed layer exposed Quantity: 1 Electronic Signature(s) Signed: 02/14/2017 9:25:10 AM By: Christin Fudge MD, FACS [name], [name] ([id number]) Entered By: Christin Fudge on 02/14/2017 09:25:09

## 2017-02-21 ENCOUNTER — Encounter: Payer: 59 | Admitting: Surgery

## 2017-02-21 DIAGNOSIS — L97222 Non-pressure chronic ulcer of left calf with fat layer exposed: Secondary | ICD-10-CM | POA: Diagnosis not present

## 2017-02-21 DIAGNOSIS — I890 Lymphedema, not elsewhere classified: Secondary | ICD-10-CM | POA: Diagnosis not present

## 2017-02-21 DIAGNOSIS — L89612 Pressure ulcer of right heel, stage 2: Secondary | ICD-10-CM | POA: Diagnosis not present

## 2017-02-21 DIAGNOSIS — E11622 Type 2 diabetes mellitus with other skin ulcer: Secondary | ICD-10-CM | POA: Diagnosis not present

## 2017-02-21 DIAGNOSIS — L97212 Non-pressure chronic ulcer of right calf with fat layer exposed: Secondary | ICD-10-CM | POA: Diagnosis not present

## 2017-02-22 NOTE — Progress Notes (Signed)
CADINCE, HILSCHER ([id number]) Visit Report for 02/21/2017 Chief Complaint Document Details Patient Name: Brooke Hopkins, Brooke Hopkins. Date of Service: 02/21/2017 8:45 AM Medical Record Number: [id number] Patient Account Number: [id number] Date of Birth/Sex: 10-14-1951 (66 y.o. Female) Treating RN: Ahmed Prima Primary Care Provider: FITZGERALD, DAVID Other Clinician: Referring Provider: FITZGERALD, DAVID Treating Provider/Extender: Frann Rider in Treatment: 48 Information Obtained from: Patient Chief Complaint Mrs. Gaertner returns for follow-up to her bilateral lower extremity ulcers Electronic Signature(s) Signed: 02/21/2017 9:20:56 AM By: Christin Fudge MD, FACS Entered By: Christin Fudge on 02/21/2017 09:20:56 [name] ([id number]) -------------------------------------------------------------------------------- Debridement Details Patient Name: Brooke Hopkins, Brooke Hopkins. Date of Service: 02/21/2017 8:45 AM Medical Record Number: [id number] Patient Account Number: [id number] Date of Birth/Sex: [birth year] (66 y.o. Female) Treating RN: Ahmed Prima Primary Care Provider: FITZGERALD, DAVID Other Clinician: Referring Provider: FITZGERALD, DAVID Treating Provider/Extender: Frann Rider in Treatment: 35 Debridement Performed for Wound #1 Left,Posterior Lower Leg Assessment: Performed By: Physician Christin Fudge, MD Debridement: Debridement Pre-procedure Yes - 09:11 Verification/Time Out Taken: Start Time: 09:14 Pain Control: Lidocaine 4% Topical Solution Level: Skin/Subcutaneous Tissue Total Area Debrided (L x 2.4 (cm) x 0.5 (cm) = 1.2 (cm) W): Tissue and other Viable, Non-Viable, Exudate, Fibrin/Slough, Subcutaneous material debrided: Instrument: Curette Bleeding: Minimum Hemostasis Achieved: Pressure End Time: 09:15 Procedural Pain: 0 Post Procedural Pain: 0 Response to Treatment: Procedure was tolerated well Post Debridement Measurements of Total  Wound Length: (cm) 2.4 Width: (cm) 0.5 Depth: (cm) 0.1 Volume: (cm) 0.094 Character of Wound/Ulcer Post Requires Further Debridement Debridement: Severity of Tissue Post Debridement: Fat layer exposed Post Procedure Diagnosis Same as Pre-procedure Electronic Signature(s) Signed: 02/21/2017 9:20:36 AM By: Christin Fudge MD, FACS Signed: 02/21/2017 4:53:33 PM By: Alric Quan Entered By: Christin Fudge on 02/21/2017 09:20:36 [name] ([name], [name] ([id number]) -------------------------------------------------------------------------------- Debridement Details Patient Name: Brooke Hopkins, Brooke Hopkins. Date of Service: 02/21/2017 8:45 AM Medical Record Number: [id number] Patient Account Number: [id number] Date of Birth/Sex: December 19, 1951 (66 y.o. Female) Treating RN: Ahmed Prima Primary Care Provider: FITZGERALD, DAVID Other Clinician: Referring Provider: FITZGERALD, DAVID Treating Provider/Extender: Frann Rider in Treatment: 35 Debridement Performed for Wound #2 Right,Posterior Lower Leg Assessment: Performed By: Physician Christin Fudge, MD Debridement: Debridement Pre-procedure Yes - 09:11 Verification/Time Out Taken: Start Time: 09:15 Pain Control: Lidocaine 4% Topical Solution Level: Skin/Subcutaneous Tissue Total Area Debrided (L x 0.5 (cm) x 0.7 (cm) = 0.35 (cm) W): Tissue and other Viable, Non-Viable, Exudate, Fibrin/Slough, Subcutaneous material debrided: Instrument: Curette Bleeding: Minimum Hemostasis Achieved: Pressure End Time: 09:16 Procedural Pain: 0 Post Procedural Pain: 0 Response to Treatment: Procedure was tolerated well Post Debridement Measurements of Total Wound Length: (cm) 0.5 Width: (cm) 0.7 Depth: (cm) 0.1 Volume: (cm) 0.027 Character of Wound/Ulcer Post Requires Further Debridement Debridement: Severity of Tissue Post Debridement: Fat layer exposed Post Procedure Diagnosis Same as  Pre-procedure Electronic Signature(s) Signed: 02/21/2017 9:20:42 AM By: Christin Fudge MD, FACS Signed: 02/21/2017 4:53:33 PM By: Alric Quan Entered By: Christin Fudge on 02/21/2017 09:20:42 RASHUNDA, PASSON ([name], [name] ([id number]) -------------------------------------------------------------------------------- Debridement Details Patient Name: Brooke Hopkins, Brooke Hopkins. Date of Service: 02/21/2017 8:45 AM Medical Record Number: [id number] Patient Account Number: [id number] Date of Birth/Sex: 05-Dec-1951 (66 y.o. Female) Treating RN: Ahmed Prima Primary Care Provider: FITZGERALD, DAVID Other Clinician: Referring Provider: FITZGERALD, DAVID Treating Provider/Extender: Frann Rider in Treatment: 35 Debridement Performed for Wound #3 Right Calcaneus Assessment: Performed By: Physician Christin Fudge, MD Debridement: Debridement Pre-procedure Yes - 09:11 Verification/Time Out Taken: Start Time: 09:12 Pain Control:  Lidocaine 4% Topical Solution Level: Skin/Subcutaneous Tissue Total Area Debrided (L x 0.8 (cm) x 0.8 (cm) = 0.64 (cm) W): Tissue and other Viable, Non-Viable, Exudate, Fibrin/Slough, Subcutaneous material debrided: Instrument: Curette Bleeding: Minimum Hemostasis Achieved: Pressure End Time: 09:13 Procedural Pain: 0 Post Procedural Pain: 0 Response to Treatment: Procedure was tolerated well Post Debridement Measurements of Total Wound Length: (cm) 0.8 Stage: Category/Stage II Width: (cm) 0.8 Depth: (cm) 0.1 Volume: (cm) 0.05 Character of Wound/Ulcer Post Requires Further Debridement: Debridement Severity of Tissue Post Fat layer exposed Debridement: Post Procedure Diagnosis Same as Pre-procedure Electronic Signature(s) Signed: 02/21/2017 9:20:49 AM By: Christin Fudge MD, FACS Signed: 02/21/2017 4:53:33 PM By: Alric Quan Entered By: Christin Fudge on 02/21/2017 09:20:49 [name] ([id number]) [name], [name]  ([id number]) -------------------------------------------------------------------------------- HPI Details Patient Name: Brooke Hopkins, Brooke Hopkins. Date of Service: 02/21/2017 8:45 AM Medical Record Number: [id number] Patient Account Number: [id number] Date of Birth/Sex: April 26, 1951 (66 y.o. Female) Treating RN: Ahmed Prima Primary Care Provider: FITZGERALD, DAVID Other Clinician: Referring Provider: FITZGERALD, DAVID Treating Provider/Extender: Frann Rider in Treatment: 35 History of Present Illness Location: massive swelling of both lower extremities and ulceration both lower extremities Quality: Patient reports experiencing a dull pain to affected area(s). Severity: Patient states wound are getting worse. Duration: Patient has had the wound for >2 years prior to seeking treatment at the wound center Timing: Pain in wound is constant (hurts all the time) Context: The wound appeared gradually over time Modifying Factors: Other treatment(s) tried include:she has a lymphedema pump but uses it seldom and she's had several course of antibiotics Associated Signs and Symptoms: Patient reports having difficulty standing for long periods. HPI Description: 66 year old patient seen by Dr. Ola Spurr of infectious disease who has been following up for left lower extremity cellulitis and ulcer with recurrent bilateral lower extremity problems for several months. Recently she had a large right lower extremity bullae which opened out and has been ulcerated. She has seen the vascular group and has been getting Unna's wraps and has a lymphedema pump used in the past. Her prior cultures were positive for Pseudomonas, Proteus and was treated with amoxicillin. He has also been treated with 2 weeks course of ciprofloxacin and amoxicillin.. Increase of Lasix dose helped with the edema and echo showed no systolic CHF but may be diastolic problems. Past medical history significant for diabetes mellitus type  2, venous stasis ulcer, obesity, diabetic peripheral neuropathy, status post back surgery, cholecystectomy, hysterectomy, arthroscopy of the knee. He is a former smoker and quit smoking in 1984. The patient has seen Dr. Delana Meyer who did not recommend any arterial or venous duplex studies and has been using Unna's wraps and also recommended a lymphedema pump. she has been very noncompliant with using these. 06/28/2016 -- the patient is still on antibiotics as prescribed by Dr. Ola Spurr and he is asked her to take it for 3 weeks. The patient also says she has a lot of redness and pain in the folds of her thigh and lower extremity and this is very painful. 07/26/2016 -- the patient is off antibiotics and has been getting dressing changes 3 times a week. 08/09/2016 -- is been on Cipro and is taking potassium supplements along with her Lasix and is going to be seeing Dr. Ola Spurr for a consult return visit only in November 2017. 08/23/2016 -- she was admitted to the hospital last week and I have reviewed these reports in detail. She was seen by Dr. Ola Spurr who noted a Pseudomonas infection which  was resistant to ciprofloxacin and discharged her on Ceftazidime 1 g IV every 12 hourly anyone days. The antibiotic was to be stopped on September 11 and he would see her back in the clinic. 08/30/2016 -- had a communication from Dr. Ola Spurr that he would extend her antibiotics by a week if she continued to look like cellulitis was persisting. Brooke Hopkins, Brooke Hopkins ([id number]) 09/13/2016 -- the PICC line is out and antibiotics have stopped. 10/04/16: returns today for f/u. reports utilizes compression pump twice daily. she is compliant with her compression therapy. she reports a new blister on the right proximal posterior calf. no systemic s/s of infection. 10/11/16: returns today for f/u. she reports adherence to her compression pumps, but there is no improvement regarding her BLE edema. there is  weeping of fluid. denies fever, chills, body aches or malaise. 10/18/2016 -- she has a significant cellulitis of her right lower extremity which was not there last week. I believe at this stage she will need the expertise of Dr. Ola Spurr to decide on antibiotic coverage. 10/25/2016 -- the patient has had cipro and ampicillin been started by Dr. Ola Spurr and he is awaiting further cultures. Overall she's been feeling better. 11/08/2016 -- Dr. Ola Spurr this week who has continued with ciprofloxacin and amoxicillin and is awaiting further cultures before deciding to place her on a PICC line. 11/15/2016 -- Dr. Blane Ohara note was reviewed and her cultures growing Pseudomonas in both legs which is sensitive to Cipro and the other is resistant to Cipro and she would get a PICC line and start IV antibiotics -- Ceftazidime 1 g q 12 for 3 weeks. 11-29-16 Mrs. Laningham presents today in follow-up of her bilateral lower exterminators ulcers. She remains on IV antibiotic therapy via a PICC line per Dr. Ola Spurr; she has a follow-up appointment with him on Monday, 12/02/2016. She anticipates that the about therapy will be discontinued at that appointment. She denies any complications of nausea vomiting and/or diarrhea accompanied by this antibiotic therapy. She admits to using her lymphedema pumps twice daily with the exception of yesterday. She denies any concerns or complications with the current treatment plan. 12/06/2016 -- Dr. Ola Spurr saw her recently on 12/02/2016 and due to the fact she has had significant problems with recurrent ulceration and Pseudomonas infection he recommended 3 more weeks of antibiotics and extended it until December 25. The PICC line will be in place. Last hemoglobin A1c on November 30 was 9% 12/27/2016 -- because of the holidays the patient's diet has been higher in salt, her dressings have not been done as required and she is not used to lymphedema pumps  appropriately. This has led to her lymphedema increasing markedly. 01/24/2017 -- the patient's hospital bed is malfunctioning and hence her legs have been flat in bed and her lymphedema is increased markedly. During my examination I also noted a unstageable pressure injury to her right heel 02/07/2017 -- she has returned after 2 weeks as she was done with influenza and has been in bed a lot, with a new bed which has helped her reduce the edema due to proper elevation. Will she has a full-fledged large pressure injury to the right heel. Electronic Signature(s) Signed: 02/21/2017 9:21:03 AM By: Christin Fudge MD, FACS Entered By: Christin Fudge on 02/21/2017 09:21:03 [name] ([id number]) -------------------------------------------------------------------------------- Physical Exam Details Patient Name: Brooke Hopkins, Brooke Hopkins. Date of Service: 02/21/2017 8:45 AM Medical Record Number: [id number] Patient Account Number: [id number] Date of Birth/Sex: 02-05-1951 (66 y.o. Female) Treating RN: Carolyne Fiscal, Debi  Primary Care Provider: FITZGERALD, DAVID Other Clinician: Referring Provider: FITZGERALD, DAVID Treating Provider/Extender: Frann Rider in Treatment: 35 Constitutional . Pulse regular. Respirations normal and unlabored. Afebrile. . Eyes Nonicteric. Reactive to light. Ears, Nose, Mouth, and Throat Lips, teeth, and gums WNL.Marland Kitchen Moist mucosa without lesions. Neck supple and nontender. No palpable supraclavicular or cervical adenopathy. Normal sized without goiter. Respiratory WNL. No retractions.. Breath sounds WNL, No rubs, rales, rhonchi, or wheeze.. Cardiovascular Heart rhythm and rate regular, no murmur or gallop.. Pedal Pulses WNL. No clubbing, cyanosis or edema. Chest Breasts symmetical and no nipple discharge.. Breast tissue WNL, no masses, lumps, or tenderness.. Lymphatic No adneopathy. No adenopathy. No adenopathy. Musculoskeletal Adexa without tenderness or  enlargement.. Digits and nails w/o clubbing, cyanosis, infection, petechiae, ischemia, or inflammatory conditions.. Integumentary (Hair, Skin) No suspicious lesions. No crepitus or fluctuance. No peri-wound warmth or erythema. No masses.Marland Kitchen Psychiatric Judgement and insight Intact.. No evidence of depression, anxiety, or agitation.. Notes her lymphedema has gone down bilaterally and the wounds after sharp debridement with a #3 curet look excellent and we will switch over to [employer]) Signed: 02/21/2017 9:21:30 AM By: Christin Fudge MD, FACS Entered By: Christin Fudge on 02/21/2017 09:21:29 [name] ([id number]) -------------------------------------------------------------------------------- Physician Orders Details Patient Name: Brooke Hopkins, Brooke Hopkins. Date of Service: 02/21/2017 8:45 AM Medical Record Number: [id number] Patient Account Number: [id number] Date of Birth/Sex: April 06, 1951 (66 y.o. Female) Treating RN: Ahmed Prima Primary Care Provider: FITZGERALD, DAVID Other Clinician: Referring Provider: FITZGERALD, DAVID Treating Provider/Extender: Frann Rider in Treatment: 40 Verbal / Phone Orders: Yes Clinician: Carolyne Fiscal, Debi Read Back and Verified: Yes Diagnosis Coding Wound Cleansing Wound #1 Left,Posterior Lower Leg o Clean wound with Normal Saline. - clinic use o Cleanse wound with mild soap and water - HHRN please scrub wounds with mild soap and water o May Shower, gently pat wound dry prior to applying new dressing. o May shower with protection. Wound #2 Right,Posterior Lower Leg o Clean wound with Normal Saline. - clinic use o Cleanse wound with mild soap and water - HHRN please scrub wounds with mild soap and water o May Shower, gently pat wound dry prior to applying new dressing. o May shower with protection. Wound #3 Right Calcaneus o Clean wound with Normal Saline. - clinic use o Cleanse wound with mild soap  and water - HHRN please scrub wounds with mild soap and water o May Shower, gently pat wound dry prior to applying new dressing. o May shower with protection. Anesthetic Wound #1 Left,Posterior Lower Leg o Topical Lidocaine 4% cream applied to wound bed prior to debridement - clinic use Wound #2 Right,Posterior Lower Leg o Topical Lidocaine 4% cream applied to wound bed prior to debridement - clinic use Wound #3 Right Calcaneus o Topical Lidocaine 4% cream applied to wound bed prior to debridement - clinic use Primary Wound Dressing Wound #1 Left,Posterior Lower Leg o Prisma Ag - moisten with saline Wound #2 Right,Posterior Lower Leg o Prisma Ag - moisten with saline Wound #3 Right Calcaneus RAYLAN, TROIANI ([id number]) o Prisma Ag - moisten with saline Secondary Dressing Wound #1 Left,Posterior Lower Leg o ABD pad o Dry Gauze Wound #2 Right,Posterior Lower Leg o ABD pad o Dry Gauze Wound #3 Right Calcaneus o Dry Gauze o Other - FOAM HEEL CUP Dressing Change Frequency Wound #1 Left,Posterior Lower Leg o Change Dressing Monday, Wednesday, Friday - HHRN to change wraps Monday and Wednesday Wound #2 Right,Posterior Lower Leg o Change Dressing Monday, Wednesday,  Friday - HHRN to change wraps Monday and Wednesday o Change Dressing Monday, Wednesday, Friday - HHRN to change wraps Monday and Wednesday Follow-up Appointments Wound #1 Left,Posterior Lower Leg o Return Appointment in 1 week. Wound #2 Right,Posterior Lower Leg o Return Appointment in 1 week. Edema Control Wound #1 Left,Posterior Lower Leg o 3 Layer Compression System - Bilateral - May anchor top of wrap with dome paste, but only wrap around leg one time. o Elevate legs to the level of the heart and pump ankles as often as possible o Compression Pump: Use compression pump on left lower extremity for 30 minutes, twice daily. o Compression Pump: Use compression pump on  right lower extremity for 30 minutes, twice daily. Wound #2 Right,Posterior Lower Leg o 3 Layer Compression System - Bilateral - May anchor top of wrap with dome paste, but only wrap around leg one time. o Elevate legs to the level of the heart and pump ankles as often as possible o Compression Pump: Use compression pump on left lower extremity for 30 minutes, twice daily. NITZA, SCHMID. ([id number]) o Compression Pump: Use compression pump on right lower extremity for 30 minutes, twice daily. Additional Orders / Instructions o Increase protein intake. Wound #2 Right,Posterior Lower Leg o Increase protein intake. Home Health Wound #1 [place] Visits - [place] Nurse may visit PRN to address patientos wound care needs. o FACE TO FACE ENCOUNTER: MEDICARE and MEDICAID PATIENTS: I certify that this patient is under my care and that I had a face-to-face encounter that meets the physician face-to-face encounter requirements with this patient on this date. The encounter with the patient was in whole or in part for the following MEDICAL CONDITION: (primary reason for [place]) MEDICAL NECESSITY: I certify, that based on my findings, NURSING services are a medically necessary home health service. HOME BOUND STATUS: I certify that my clinical findings support that this patient is homebound (i.e., Due to illness or injury, pt requires aid of supportive devices such as crutches, cane, wheelchairs, walkers, the use of special transportation or the assistance of another person to leave their place of residence. There is a normal inability to leave the home and doing so requires considerable and taxing effort. Other absences are for medical reasons / religious services and are infrequent or of short duration when for other reasons). o Please direct any NON-WOUND related issues/requests for orders to patient's Primary  Care Physician Wound #2 [place] Visits - [place] Nurse may visit PRN to address patientos wound care needs. o FACE TO FACE ENCOUNTER: MEDICARE and MEDICAID PATIENTS: I certify that this patient is under my care and that I had a face-to-face encounter that meets the physician face-to-face encounter requirements with this patient on this date. The encounter with the patient was in whole or in part for the following MEDICAL CONDITION: (primary reason for [place]) MEDICAL NECESSITY: I certify, that based on my findings, NURSING services are a medically necessary home health service. HOME BOUND STATUS: I certify that my clinical findings support that this patient is homebound (i.e., Due to illness or injury, pt requires aid of supportive devices such as crutches, cane, wheelchairs, walkers, the use of special transportation or the assistance of another person to leave their place of residence. There is a normal inability to leave the home and doing so requires considerable and taxing effort. Other absences are for medical reasons /  religious services and are infrequent or of short duration when for other reasons). o Please direct any NON-WOUND related issues/requests for orders to patient's Primary Care Physician Electronic Signature(s) Signed: 02/21/2017 4:26:35 PM By: Christin Fudge MD, FACS JAZMINN, POMALES ([id number]) Signed: 02/21/2017 4:53:33 PM By: Alric Quan Entered By: Alric Quan on 02/21/2017 09:17:05 [name] ([id number]) -------------------------------------------------------------------------------- Problem List Details Patient Name: Brooke Hopkins, Brooke Hopkins. Date of Service: 02/21/2017 8:45 AM Medical Record Number: [id number] Patient Account Number: [id number] Date of Birth/Sex: December 20, 1951 (66 y.o. Female) Treating RN: Ahmed Prima Primary Care Provider: FITZGERALD, DAVID Other  Clinician: Referring Provider: FITZGERALD, DAVID Treating Provider/Extender: Frann Rider in Treatment: 23 Active Problems ICD-10 Encounter Code Description Active Date Diagnosis E11.622 Type 2 diabetes mellitus with other skin ulcer 10/25/2016 Yes I89.0 Lymphedema, not elsewhere classified 10/25/2016 Yes L97.222 Non-pressure chronic ulcer of left calf with fat layer 10/25/2016 Yes exposed L97.212 Non-pressure chronic ulcer of right calf with fat layer 10/25/2016 Yes exposed E66.01 Morbid (severe) obesity due to excess calories 10/25/2016 Yes L89.612 Pressure ulcer of right heel, stage 2 02/07/2017 Yes Inactive Problems Resolved Problems Electronic Signature(s) Signed: 02/21/2017 9:20:23 AM By: Christin Fudge MD, FACS Entered By: Christin Fudge on 02/21/2017 09:20:22 [name] ([id number]) -------------------------------------------------------------------------------- Progress Note Details Patient Name: Brooke Hopkins, Brooke Hopkins. Date of Service: 02/21/2017 8:45 AM Medical Record Number: [id number] Patient Account Number: [id number] Date of Birth/Sex: May 21, 1951 (66 y.o. Female) Treating RN: Ahmed Prima Primary Care Provider: FITZGERALD, DAVID Other Clinician: Referring Provider: FITZGERALD, DAVID Treating Provider/Extender: Frann Rider in Treatment: 35 Subjective Chief Complaint Information obtained from Patient Mrs. Stuber returns for follow-up to her bilateral lower extremity ulcers History of Present Illness (HPI) The following HPI elements were documented for the patient's wound: Location: massive swelling of both lower extremities and ulceration both lower extremities Quality: Patient reports experiencing a dull pain to affected area(s). Severity: Patient states wound are getting worse. Duration: Patient has had the wound for >2 years prior to seeking treatment at the wound center Timing: Pain in wound is constant (hurts all the time) Context: The  wound appeared gradually over time Modifying Factors: Other treatment(s) tried include:she has a lymphedema pump but uses it seldom and she's had several course of antibiotics Associated Signs and Symptoms: Patient reports having difficulty standing for long periods. 66 year old patient seen by Dr. Ola Spurr of infectious disease who has been following up for left lower extremity cellulitis and ulcer with recurrent bilateral lower extremity problems for several months. Recently she had a large right lower extremity bullae which opened out and has been ulcerated. She has seen the vascular group and has been getting Unna's wraps and has a lymphedema pump used in the past. Her prior cultures were positive for Pseudomonas, Proteus and was treated with amoxicillin. He has also been treated with 2 weeks course of ciprofloxacin and amoxicillin.. Increase of Lasix dose helped with the edema and echo showed no systolic CHF but may be diastolic problems. Past medical history significant for diabetes mellitus type 2, venous stasis ulcer, obesity, diabetic peripheral neuropathy, status post back surgery, cholecystectomy, hysterectomy, arthroscopy of the knee. He is a former smoker and quit smoking in 1986. The patient has seen Dr. Delana Meyer who did not recommend any arterial or venous duplex studies and has been using Unna's wraps and also recommended a lymphedema pump. she has been very noncompliant with using these. 06/28/2016 -- the patient is still on antibiotics as prescribed by Dr. Ola Spurr and he is asked her to take  it for 3 weeks. The patient also says she has a lot of redness and pain in the folds of her thigh and lower extremity and this is very painful. 07/26/2016 -- the patient is off antibiotics and has been getting dressing changes 3 times a week. 08/09/2016 -- is been on Cipro and is taking potassium supplements along with her Lasix and is going to be seeing Dr. Ola Spurr for a consult  return visit only in November 2017. Brooke Hopkins, Brooke Hopkins ([id number]) 08/23/2016 -- she was admitted to the hospital last week and I have reviewed these reports in detail. She was seen by Dr. Ola Spurr who noted a Pseudomonas infection which was resistant to ciprofloxacin and discharged her on Ceftazidime 1 g IV every 12 hourly anyone days. The antibiotic was to be stopped on September 11 and he would see her back in the clinic. 08/30/2016 -- had a communication from Dr. Ola Spurr that he would extend her antibiotics by a week if she continued to look like cellulitis was persisting. 09/13/2016 -- the PICC line is out and antibiotics have stopped. 10/04/16: returns today for f/u. reports utilizes compression pump twice daily. she is compliant with her compression therapy. she reports a new blister on the right proximal posterior calf. no systemic s/s of infection. 10/11/16: returns today for f/u. she reports adherence to her compression pumps, but there is no improvement regarding her BLE edema. there is weeping of fluid. denies fever, chills, body aches or malaise. 10/18/2016 -- she has a significant cellulitis of her right lower extremity which was not there last week. I believe at this stage she will need the expertise of Dr. Ola Spurr to decide on antibiotic coverage. 10/25/2016 -- the patient has had cipro and ampicillin been started by Dr. Ola Spurr and he is awaiting further cultures. Overall she's been feeling better. 11/08/2016 -- Dr. Ola Spurr this week who has continued with ciprofloxacin and amoxicillin and is awaiting further cultures before deciding to place her on a PICC line. 11/15/2016 -- Dr. Blane Ohara note was reviewed and her cultures growing Pseudomonas in both legs which is sensitive to Cipro and the other is resistant to Cipro and she would get a PICC line and start IV antibiotics -- Ceftazidime 1 g q 12 for 3 weeks. 11-29-16 Mrs. Coulibaly presents today in follow-up  of her bilateral lower exterminators ulcers. She remains on IV antibiotic therapy via a PICC line per Dr. Ola Spurr; she has a follow-up appointment with him on Monday, 12/02/2016. She anticipates that the about therapy will be discontinued at that appointment. She denies any complications of nausea vomiting and/or diarrhea accompanied by this antibiotic therapy. She admits to using her lymphedema pumps twice daily with the exception of yesterday. She denies any concerns or complications with the current treatment plan. 12/06/2016 -- Dr. Ola Spurr saw her recently on 12/02/2016 and due to the fact she has had significant problems with recurrent ulceration and Pseudomonas infection he recommended 3 more weeks of antibiotics and extended it until December 25. The PICC line will be in place. Last hemoglobin A1c on November 30 was 9% 12/27/2016 -- because of the holidays the patient's diet has been higher in salt, her dressings have not been done as required and she is not used to lymphedema pumps appropriately. This has led to her lymphedema increasing markedly. 01/24/2017 -- the patient's hospital bed is malfunctioning and hence her legs have been flat in bed and her lymphedema is increased markedly. During my examination I also noted a unstageable pressure  injury to her right heel 02/07/2017 -- she has returned after 2 weeks as she was done with influenza and has been in bed a lot, with a new bed which has helped her reduce the edema due to proper elevation. Will she has a full-fledged large pressure injury to the right heel. Brooke Hopkins, Brooke Hopkins. ([id number]) Objective Constitutional Pulse regular. Respirations normal and unlabored. Afebrile. Vitals Time Taken: 8:54 AM, Height: 67 in, Weight: 300 lbs, BMI: 47, Temperature: 97.9 F, Pulse: 86 bpm, Respiratory Rate: 18 breaths/min, Blood Pressure: 177/63 mmHg. Eyes Nonicteric. Reactive to light. Ears, Nose, Mouth, and Throat Lips, teeth,  and gums WNL.Marland Kitchen Moist mucosa without lesions. Neck supple and nontender. No palpable supraclavicular or cervical adenopathy. Normal sized without goiter. Respiratory WNL. No retractions.. Breath sounds WNL, No rubs, rales, rhonchi, or wheeze.. Cardiovascular Heart rhythm and rate regular, no murmur or gallop.. Pedal Pulses WNL. No clubbing, cyanosis or edema. Chest Breasts symmetical and no nipple discharge.. Breast tissue WNL, no masses, lumps, or tenderness.. Lymphatic No adneopathy. No adenopathy. No adenopathy. Musculoskeletal Adexa without tenderness or enlargement.. Digits and nails w/o clubbing, cyanosis, infection, petechiae, ischemia, or inflammatory conditions.Marland Kitchen Psychiatric Judgement and insight Intact.. No evidence of depression, anxiety, or agitation.. General Notes: her lymphedema has gone down bilaterally and the wounds after sharp debridement with a #3 curet look excellent and we will switch over to [employer] Integumentary (Hair, Skin) No suspicious lesions. No crepitus or fluctuance. No peri-wound warmth or erythema. No masses.. Wound #1 status is Open. Original cause of wound was Gradually Appeared. The wound is located on the Left,Posterior Lower Leg. The wound measures 2.4cm length x 0.5cm width x 0.1cm depth; 0.942cm^2 area and 0.094cm^3 volume. The wound is limited to skin breakdown. There is no tunneling or undermining ZAIDEE, RION. ([id number]) noted. There is a large amount of serous drainage noted. The wound margin is flat and intact. There is large (67-100%) red granulation within the wound bed. There is a small (1-33%) amount of necrotic tissue within the wound bed including Adherent Slough. The periwound skin appearance exhibited: Excoriation, Maceration, Rubor. The periwound skin appearance did not exhibit: Callus, Crepitus, Induration, Rash, Scarring, Dry/Scaly, Atrophie Blanche, Cyanosis, Ecchymosis, Hemosiderin Staining, Mottled, Pallor, Erythema.  Periwound temperature was noted as No Abnormality. The periwound has tenderness on palpation. Wound #2 status is Open. Original cause of wound was Gradually Appeared. The wound is located on the Right,Posterior Lower Leg. The wound measures 0.5cm length x 0.7cm width x 0.1cm depth; 0.275cm^2 area and 0.027cm^3 volume. The wound is limited to skin breakdown. There is no tunneling or undermining noted. There is a large amount of serous drainage noted. The wound margin is indistinct and nonvisible. There is medium (34-66%) red granulation within the wound bed. There is a medium (34-66%) amount of necrotic tissue within the wound bed including Adherent Slough. The periwound skin appearance exhibited: Excoriation, Maceration, Hemosiderin Staining, Rubor, Erythema. The periwound skin appearance did not exhibit: Callus, Crepitus, Induration, Rash, Scarring, Dry/Scaly, Atrophie Blanche, Cyanosis, Ecchymosis, Mottled, Pallor. The surrounding wound skin color is noted with erythema which is circumferential. Periwound temperature was noted as No Abnormality. The periwound has tenderness on palpation. Wound #3 status is Open. Original cause of wound was Pressure Injury. The wound is located on the Right Calcaneus. The wound measures 0.8cm length x 0.8cm width x 0.1cm depth; 0.503cm^2 area and 0.05cm^3 volume. There is no tunneling or undermining noted. There is a large amount of serosanguineous drainage noted. The wound margin is  distinct with the outline attached to the wound base. There is large (67-100%) red, pink granulation within the wound bed. There is a small (1-33%) amount of necrotic tissue within the wound bed including Adherent Slough. The periwound skin appearance exhibited: Maceration. Periwound temperature was noted as No Abnormality. The periwound has tenderness on palpation. 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 ([id number]) Wound #1 is a Lymphedema located on the Left,Posterior Lower Leg . There was a Skin/Subcutaneous Tissue Debridement ([id number]) debridement with total area of 1.2 sq cm performed by Christin Fudge, MD. with the following instrument(s): Curette to remove Viable and Non-Viable tissue/material including Exudate, Fibrin/Slough, and Subcutaneous after achieving pain control using Lidocaine 4% Topical Solution. A time out was conducted at 09:11, prior to the start of the procedure. A Minimum amount of bleeding was controlled with Pressure. The procedure was tolerated well with a pain level of 0 throughout and a pain level of 0 following the procedure. Post Debridement Measurements: 2.4cm length x 0.5cm width x 0.1cm depth; 0.094cm^3 volume. Character of Wound/Ulcer Post Debridement requires further debridement. Severity of Tissue Post Debridement is: Fat layer exposed. Post procedure Diagnosis Wound #1: Same as Pre-Procedure Wound #2 Wound #2 is a Lymphedema located on the Right,Posterior Lower Leg . There was a Skin/Subcutaneous Tissue Debridement ([id number]) debridement with total area of 0.35 sq cm performed by Christin Fudge, MD. with the following instrument(s): Curette to remove Viable and Non-Viable tissue/material including Exudate, Fibrin/Slough, and Subcutaneous after achieving pain control using Lidocaine 4% Topical Solution. A time out was conducted at 09:11, prior to the start of the procedure. A Minimum amount of bleeding was controlled with Pressure. The procedure was tolerated well with a pain level of 0 throughout and a pain level of 0 following the procedure.  Post Debridement Measurements: 0.5cm length x 0.7cm width x 0.1cm depth; 0.027cm^3 volume. Character of Wound/Ulcer Post Debridement requires further debridement. Severity of Tissue Post Debridement is: Fat layer exposed. Post procedure Diagnosis Wound #2: Same as Pre-Procedure Wound #3 Wound #3 is a Pressure Ulcer located on the Right Calcaneus . There was a Skin/Subcutaneous Tissue Debridement ([id number]) debridement with total area of 0.64 sq cm performed by Christin Fudge, MD. with the following instrument(s): Curette to remove Viable and Non-Viable tissue/material including Exudate, Fibrin/Slough, and Subcutaneous after achieving pain control using Lidocaine 4% Topical Solution. A time out was conducted at 09:11, prior to the start of the procedure. A Minimum amount of bleeding was controlled with Pressure. The procedure was tolerated well with a pain level of 0 throughout and a pain level of 0 following the procedure. Post Debridement Measurements: 0.8cm length x 0.8cm width x 0.1cm depth; 0.05cm^3 volume. Post debridement Stage noted as Category/Stage II. Character of Wound/Ulcer Post Debridement requires further debridement. Severity of Tissue Post Debridement is: Fat layer exposed. Post procedure Diagnosis Wound #3: Same as Pre-Procedure Plan Wound Cleansing: Wound #1 Left,Posterior Lower Leg: Clean wound with Normal Saline. - clinic use LUS, KRIEGEL. ([id number]) Cleanse wound with mild soap and water - HHRN please scrub wounds with mild soap and water May Shower, gently pat wound dry prior to applying  new dressing. May shower with protection. Wound #2 Right,Posterior Lower Leg: Clean wound with Normal Saline. - clinic use Cleanse wound with mild soap and water - HHRN please scrub wounds with mild soap and water May Shower, gently pat wound dry prior to applying new dressing. May shower with protection. Wound #3 Right Calcaneus: Clean wound with Normal Saline. - clinic  use Cleanse wound with mild soap and water - HHRN please scrub wounds with mild soap and water May Shower, gently pat wound dry prior to applying new dressing. May shower with protection. Anesthetic: Wound #1 Left,Posterior Lower Leg: Topical Lidocaine 4% cream applied to wound bed prior to debridement - clinic use Wound #2 Right,Posterior Lower Leg: Topical Lidocaine 4% cream applied to wound bed prior to debridement - clinic use Wound #3 Right Calcaneus: Topical Lidocaine 4% cream applied to wound bed prior to debridement - clinic use Primary Wound Dressing: Wound #1 Left,Posterior Lower Leg: Prisma Ag - moisten with saline Wound #2 Right,Posterior Lower Leg: Prisma Ag - moisten with saline Wound #3 Right Calcaneus: Prisma Ag - moisten with saline Secondary Dressing: Wound #1 Left,Posterior Lower Leg: ABD pad Dry Gauze Wound #2 Right,Posterior Lower Leg: ABD pad Dry Gauze Wound #3 Right Calcaneus: Dry Gauze Other - FOAM HEEL CUP Dressing Change Frequency: Wound #1 Left,Posterior Lower Leg: Change Dressing Monday, Wednesday, Friday - HHRN to change wraps Monday and Wednesday Wound #2 Right,Posterior Lower Leg: Change Dressing Monday, Wednesday, Friday - HHRN to change wraps Monday and Wednesday Change Dressing Monday, Wednesday, Friday - HHRN to change wraps Monday and Wednesday Follow-up Appointments: Wound #1 Left,Posterior Lower Leg: Return Appointment in 1 week. Wound #2 Right,Posterior Lower Leg: Return Appointment in 1 week. Edema Control: Wound #1 Left,Posterior Lower Leg: 3 Layer Compression System - Bilateral - May anchor top of wrap with dome paste, but only wrap around NAHDIA, DOUCET. ([id number]) leg one time. Elevate legs to the level of the heart and pump ankles as often as possible Compression Pump: Use compression pump on left lower extremity for 30 minutes, twice daily. Compression Pump: Use compression pump on right lower extremity for 30 minutes,  twice daily. Wound #2 Right,Posterior Lower Leg: 3 Layer Compression System - Bilateral - May anchor top of wrap with dome paste, but only wrap around leg one time. Elevate legs to the level of the heart and pump ankles as often as possible Compression Pump: Use compression pump on left lower extremity for 30 minutes, twice daily. Compression Pump: Use compression pump on right lower extremity for 30 minutes, twice daily. Additional Orders / Instructions: Increase protein intake. Wound #2 Right,Posterior Lower Leg: Increase protein intake. Home Health: Wound #1 Left,Posterior Lower Leg: Continue Home Health Visits - [place] Nurse may visit PRN to address patient s wound care needs. FACE TO FACE ENCOUNTER: MEDICARE and MEDICAID PATIENTS: I certify that this patient is under my care and that I had a face-to-face encounter that meets the physician face-to-face encounter requirements with this patient on this date. The encounter with the patient was in whole or in part for the following MEDICAL CONDITION: (primary reason for [place]) MEDICAL NECESSITY: I certify, that based on my findings, NURSING services are a medically necessary home health service. HOME BOUND STATUS: I certify that my clinical findings support that this patient is homebound (i.e., Due to illness or injury, pt requires aid of supportive devices such as crutches, cane, wheelchairs, walkers, the use of special transportation or the assistance of another  person to leave their place of residence. There is a normal inability to leave the home and doing so requires considerable and taxing effort. Other absences are for medical reasons / religious services and are infrequent or of short duration when for other reasons). Please direct any NON-WOUND related issues/requests for orders to patient's Primary Care Physician Wound #2 Right,Posterior Lower Leg: [place] Visits - [place] Nurse  may visit PRN to address patient s wound care needs. FACE TO FACE ENCOUNTER: MEDICARE and MEDICAID PATIENTS: I certify that this patient is under my care and that I had a face-to-face encounter that meets the physician face-to-face encounter requirements with this patient on this date. The encounter with the patient was in whole or in part for the following MEDICAL CONDITION: (primary reason for [place]) MEDICAL NECESSITY: I certify, that based on my findings, NURSING services are a medically necessary home health service. HOME BOUND STATUS: I certify that my clinical findings support that this patient is homebound (i.e., Due to illness or injury, pt requires aid of supportive devices such as crutches, cane, wheelchairs, walkers, the use of special transportation or the assistance of another person to leave their place of residence. There is a normal inability to leave the home and doing so requires considerable and taxing effort. Other absences are for medical reasons / religious services and are infrequent or of short duration when for other reasons). Please direct any NON-WOUND related issues/requests for orders to patient's Primary Care Physician I have recommended: RAIZA, KIESEL. ([id number]) 1. silver collagen and drawtex and a 3 layer Profore to both lower extremities. 2. She will continue with elevation and exercise and her lymphedema pumps -- I had a long serious discussion with her regarding the importance of this 3. changing her dressings twice a week. 4. right heel will need silver collagen, be protected with a heel cup and again offloading techniques have been discussed in great detail 5. I have had a detailed discussion with them, asked to be compliant with the appropriate dietary restriction, elevation and use of her lymphedema pumps. I have commended her on being very good with her diet and elevation of the leg Electronic Signature(s) Signed: 02/21/2017 9:23:20 AM  By: Christin Fudge MD, FACS Entered By: Christin Fudge on 02/21/2017 09:23:20 [name] ([id number]) -------------------------------------------------------------------------------- SuperBill Details Patient Name: KALIANNA, VERBEKE. Date of Service: 02/21/2017 Medical Record Number: [id number] Patient Account Number: [id number] Date of Birth/Sex: [birth year] (66 y.o. 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 E66.01 Morbid (severe) obesity due to excess calories L89.612 Pressure ulcer of right heel, stage 2 Facility Procedures CPT4 Code: [id number] Description: 11042 - DEB SUBQ TISSUE 20 SQ CM/< ICD-10 Description Diagnosis E11.622 Type 2 diabetes mellitus with other skin ulcer L97.222 Non-pressure chronic ulcer of left calf with fat l L97.212 Non-pressure chronic ulcer of right calf with fat  L89.612 Pressure ulcer of right heel, stage 2 Modifier: ayer exposed layer exposed Quantity: 1 Physician Procedures CPT4 Code: [id number] Description: 11042 - WC PHYS SUBQ TISS 20 SQ CM ICD-10 Description Diagnosis E11.622 Type 2 diabetes mellitus with other skin ulcer L97.222 Non-pressure  chronic ulcer of left calf with fat l L97.212 Non-pressure chronic ulcer of right calf with fat  L89.612 Pressure ulcer of right heel, stage 2 Modifier: ayer exposed layer exposed Quantity: 1 Electronic Signature(s) Signed: 02/21/2017 9:23:33 AM By: Christin Fudge MD, FACS TENSLEY, WERY ([id number]) Entered By: Christin Fudge on 02/21/2017  09:23:33

## 2017-02-22 NOTE — Progress Notes (Signed)
Brooke Hopkins, Brooke Hopkins ([id number]) Visit Report for 02/21/2017 Arrival Information Details Patient Name: Brooke Hopkins, Brooke Hopkins. Date of Service: 02/21/2017 8:45 AM Medical Record Number: [id number] Patient Account Number: [id number] Date of Birth/Sex: 01/10/1951 (67 y.o. Female) Treating RN: Ahmed Prima Primary Care Jossue Rubenstein: FITZGERALD, DAVID Other Clinician: Referring Kearia Yin: FITZGERALD, DAVID Treating Broughton Eppinger/Extender: Frann Rider in Treatment: 11 Visit Information History Since Last Visit All ordered tests and consults were completed: No Patient Arrived: Wheel Chair Added or deleted any medications: No Arrival Time: 08:52 Any new allergies or adverse reactions: No Accompanied By: son Had a fall or experienced change in No Transfer Assistance: EasyPivot activities of daily living that may affect Patient Lift risk of falls: Patient Identification Verified: Yes Signs or symptoms of abuse/neglect since last No Secondary Verification Process Yes visito Completed: Hospitalized since last visit: No Patient Requires Transmission- No Has Dressing in Place as Prescribed: Yes Based Precautions: Has Compression in Place as Prescribed: Yes Patient Has Alerts: No Pain Present Now: No Electronic Signature(s) Signed: 02/21/2017 4:53:33 PM By: Alric Quan Entered By: Alric Quan on 02/21/2017 08:53:11 Brooke Hopkins ([id number]) -------------------------------------------------------------------------------- Encounter Discharge Information Details Patient Name: Brooke Hopkins. Date of Service: 02/21/2017 8:45 AM Medical Record Number: [id number] Patient Account Number: [id number] Date of Birth/Sex: [birth year] (66 y.o. Female) Treating RN: Ahmed Prima Primary Care Tinika Bucknam: FITZGERALD, DAVID Other Clinician: Referring Aritzel Krusemark: FITZGERALD, DAVID Treating Tobin Witucki/Extender: Frann Rider in Treatment: 95 Encounter Discharge Information  Items Discharge Pain Level: 0 Discharge Condition: Stable Ambulatory Status: Wheelchair Discharge Destination: Home Transportation: Private Auto Accompanied By: son Schedule Follow-up Appointment: Yes Medication Reconciliation completed and provided to Patient/Care No Daneshia Tavano: Provided on Clinical Summary of Care: 02/21/2017 Form Type Recipient Paper Patient LB Electronic Signature(s) Signed: 02/21/2017 9:39:33 AM By: Ruthine Dose Entered By: Ruthine Dose on 02/21/2017 09:39:33 Brooke Hopkins ([id number]) -------------------------------------------------------------------------------- Lower Extremity Assessment Details Patient Name: Brooke Hopkins. Date of Service: 02/21/2017 8:45 AM Medical Record Number: [id number] Patient Account Number: [id number] Date of Birth/Sex: 06-Apr-1951 (66 y.o. Female) Treating RN: Ahmed Prima Primary Care Omnia Dollinger: FITZGERALD, DAVID Other Clinician: Referring Saddie Sandeen: FITZGERALD, DAVID Treating Jakylan Ron/Extender: Frann Rider in Treatment: 35 Edema Assessment Assessed: [Left: No] [Right: No] E[Left: dema] [Right: :] Calf Left: Right: Point of Measurement: 32 cm From Medial Instep cm cm Ankle Left: Right: Point of Measurement: 11 cm From Medial Instep cm cm Electronic Signature(s) Signed: 02/21/2017 4:53:33 PM By: Alric Quan Entered By: Alric Quan on 02/21/2017 09:11:46 Brooke Hopkins ([id number]) -------------------------------------------------------------------------------- Multi Wound Chart Details Patient Name: Brooke Hopkins. Date of Service: 02/21/2017 8:45 AM Medical Record Number: [id number] Patient Account Number: [id number] Date of Birth/Sex: 1951-05-11 (66 y.o. Female) Treating RN: Ahmed Prima Primary Care Kariss Longmire: FITZGERALD, DAVID Other Clinician: Referring Uriel Dowding: FITZGERALD, DAVID Treating Zavier Canela/Extender: Frann Rider in Treatment: 35 Vital Signs Height(in):  67 Pulse(bpm): 86 Weight(lbs): 300 Blood Pressure 177/63 (mmHg): Body Mass Index(BMI): 47 Temperature(F): 97.9 Respiratory Rate 18 (breaths/min): Photos: [1:No Photos] [2:No Photos] [3:No Photos] Wound Location: [1:Left Lower Leg - Posterior Right Lower Leg -] [2:Posterior] [3:Right Calcaneus] Wounding Event: [1:Gradually Appeared] [2:Gradually Appeared] [3:Pressure Injury] Primary Etiology: [1:Lymphedema] [2:Lymphedema] [3:Pressure Ulcer] Date Acquired: [1:12/31/2015] [2:12/31/2015] [3:01/24/2017] Weeks of Treatment: [1:35] [2:35] [3:2] Wound Status: [1:Open] [2:Open] [3:Open] Clustered Wound: [1:No] [2:Yes] [3:No] Clustered Quantity: [1:N/A] [2:3] [3:N/A] Measurements L x W x D 2.4x0.5x0.1 [2:0.5x0.7x0.1] [3:0.8x0.8x0.1] (cm) Area (cm) : [1:0.942] [2:0.275] [3:0.503] Volume (cm) : [1:0.094] [2:0.027] [3:0.05] % Reduction in Area: [1:99.10%] [2:99.90%] [3:90.90%] % Reduction in Volume: 99.50% [2:99.90%] [3:95.50%]  Classification: [1:Partial Thickness] [2:Partial Thickness] [3:Category/Stage II] Exudate Amount: [1:Large] [2:Large] [3:Large] Exudate Type: [1:Serous] [2:Serous] [3:Serosanguineous] Exudate Color: [1:amber] [2:amber] [3:red, brown] Foul Odor After [1:Yes] [2:Yes] [3:No] Cleansing: Odor Anticipated Due to No [2:No] [3:N/A] Product Use: Wound Margin: [1:Flat and Intact] [2:Indistinct, nonvisible] [3:Distinct, outline attached] Granulation Amount: [1:Large (67-100%)] [2:Medium (34-66%)] [3:Large (67-100%)] Granulation Quality: [1:Red] [2:Red] [3:Red, Pink] Necrotic Amount: [1:Small (1-33%)] [2:Medium (34-66%)] [3:Small (1-33%)] Exposed Structures: Brooke Hopkins, Brooke Hopkins ([id number]) Fascia: No Fascia: No Fascia: No Fat Layer (Subcutaneous Fat Layer (Subcutaneous Fat Layer (Subcutaneous Tissue) Exposed: No Tissue) Exposed: No Tissue) Exposed: No Tendon: No Tendon: No Muscle: No Muscle: No Joint: No Joint: No Bone: No Bone: No Limited to Skin Limited to  Skin Breakdown Breakdown Epithelialization: Medium (34-66%) Small (1-33%) None Debridement: Debridement (54627- Debridement (03500- Debridement (11042- 11047) 11047) 11047) Pre-procedure 09:11 09:11 09:11 Verification/Time Out Taken: Pain Control: Lidocaine 4% Topical Lidocaine 4% Topical Lidocaine 4% Topical Solution Solution Solution Tissue Debrided: Fibrin/Slough, Exudates, Fibrin/Slough, Exudates, Fibrin/Slough, Exudates, Subcutaneous Subcutaneous Subcutaneous Level: Skin/Subcutaneous Skin/Subcutaneous Skin/Subcutaneous Tissue Tissue Tissue Debridement Area (sq 1.2 0.35 0.64 cm): Instrument: Curette Curette Curette Bleeding: Minimum Minimum Minimum Hemostasis Achieved: Pressure Pressure Pressure Procedural Pain: 0 0 0 Post Procedural Pain: 0 0 0 Debridement Treatment Procedure was tolerated Procedure was tolerated Procedure was tolerated Response: well well well Post Debridement 2.4x0.5x0.1 0.5x0.7x0.1 0.8x0.8x0.1 Measurements L x W x D (cm) Post Debridement 0.094 0.027 0.05 Volume: (cm) Post Debridement N/A N/A Category/Stage II Stage: Periwound Skin Texture: Excoriation: Yes Excoriation: Yes No Abnormalities Noted Induration: No Induration: No Callus: No Callus: No Crepitus: No Crepitus: No Rash: No Rash: No Scarring: No Scarring: No Periwound Skin Maceration: Yes Maceration: Yes Maceration: Yes Moisture: Dry/Scaly: No Dry/Scaly: No Periwound Skin Color: Rubor: Yes Erythema: Yes No Abnormalities Noted Atrophie Blanche: No Hemosiderin Staining: Yes Cyanosis: No Rubor: Yes Ecchymosis: No Atrophie Blanche: No Erythema: No Cyanosis: No Brooke Hopkins, Brooke Hopkins ([id number]) Hemosiderin Staining: No Ecchymosis: No Mottled: No Mottled: No Pallor: No Pallor: No Erythema Location: N/A Circumferential N/A Temperature: No Abnormality No Abnormality No Abnormality Tenderness on Yes Yes Yes Palpation: Wound Preparation: Ulcer Cleansing: Other: Ulcer Cleansing:  Other: Ulcer Cleansing: soap and water soap and water Rinsed/Irrigated with Saline Topical Anesthetic Topical Anesthetic Applied: Other: lidocaine Applied: Other: lidocaine Topical Anesthetic 4% 4% Applied: Other: LIDOCAINE 4% Procedures Performed: Debridement Debridement Debridement Treatment Notes Wound #1 (Left, Posterior Lower Leg) 1. Cleansed with: Clean wound with Normal Saline Cleanse wound with antibacterial soap and water 2. Anesthetic Topical Lidocaine 4% cream to wound bed prior to debridement 4. Dressing Applied: Prisma Ag 5. Secondary Dressing Applied ABD Pad Dry Gauze 7. Secured with Tape 3 Layer Compression System - Bilateral Wound #2 (Right, Posterior Lower Leg) 1. Cleansed with: Clean wound with Normal Saline Cleanse wound with antibacterial soap and water 2. Anesthetic Topical Lidocaine 4% cream to wound bed prior to debridement 4. Dressing Applied: Prisma Ag 5. Secondary Dressing Applied ABD Pad Dry Gauze 7. Secured with Tape 3 Layer Compression System - Bilateral Wound #3 (Right Calcaneus) Brooke Hopkins, Brooke Hopkins. ([id number]) 1. Cleansed with: Clean wound with Normal Saline Cleanse wound with antibacterial soap and water 2. Anesthetic Topical Lidocaine 4% cream to wound bed prior to debridement 4. Dressing Applied: Prisma Ag 5. Secondary Dressing Applied Dry Gauze 7. Secured with Tape Notes foam heel cup Electronic Signature(s) Signed: 02/21/2017 4:53:33 PM By: Alric Quan Previous Signature: 02/21/2017 9:20:27 AM Version By: Christin Fudge MD, FACS Entered By: Alric Quan on 02/21/2017 09:42:18 Handel, Taje  Jerilynn Mages ([id number]) -------------------------------------------------------------------------------- [place] Details Patient Name: Brooke Hopkins, Brooke Hopkins. Date of Service: 02/21/2017 8:45 AM Medical Record Number: [id number] Patient Account Number: [id number] Date of Birth/Sex: 04/10/1951 (67 y.o. Female) Treating  RN: Ahmed Prima Primary Care Jobe Mutch: FITZGERALD, DAVID Other Clinician: Referring Giavanni Odonovan: FITZGERALD, DAVID Treating Haani Bakula/Extender: Frann Rider in Treatment: 58 Active Inactive ` Orientation to the Wound Care Program Nursing Diagnoses: Knowledge deficit related to the wound healing center program Goals: Patient/caregiver will verbalize understanding of the [place] Date Initiated: 06/21/2016 Target Resolution Date: 03/29/2017 Goal Status: Active Interventions: Provide education on orientation to the wound center Notes: ` Wound/Skin Impairment Nursing Diagnoses: Impaired tissue integrity Goals: Patient/caregiver will verbalize understanding of skin care regimen Date Initiated: 06/21/2016 Target Resolution Date: 03/29/2017 Goal Status: Active Ulcer/skin breakdown will have a volume reduction of 30% by week 4 Date Initiated: 06/21/2016 Target Resolution Date: 03/29/2017 Goal Status: Active Ulcer/skin breakdown will have a volume reduction of 50% by week 8 Date Initiated: 06/21/2016 Target Resolution Date: 03/29/2017 Goal Status: Active Ulcer/skin breakdown will have a volume reduction of 80% by week 12 Date Initiated: 06/21/2016 Target Resolution Date: 03/29/2017 Goal Status: Active Ulcer/skin breakdown will heal within 14 weeks Date Initiated: 06/21/2016 Target Resolution Date: 03/29/2017 Brooke Hopkins, Brooke Hopkins ([id number]) Goal Status: Active Interventions: Assess patient/caregiver ability to obtain necessary supplies Assess patient/caregiver ability to perform ulcer/skin care regimen upon admission and as needed Assess ulceration(s) every visit Provide education on ulcer and skin care Notes: Electronic Signature(s) Signed: 02/21/2017 4:53:33 PM By: Alric Quan Entered By: Alric Quan on 02/21/2017 09:42:04 Brooke Hopkins ([id number]) -------------------------------------------------------------------------------- Pain  Assessment Details Patient Name: Brooke Hopkins, Brooke Hopkins. Date of Service: 02/21/2017 8:45 AM Medical Record Number: [id number] Patient Account Number: [id number] Date of Birth/Sex: Dec 24, 1951 (66 y.o. Female) Treating RN: Ahmed Prima Primary Care Marvelene Stoneberg: FITZGERALD, DAVID Other Clinician: Referring Claressa Hughley: FITZGERALD, DAVID Treating Robina Hamor/Extender: Frann Rider in Treatment: 35 Active Problems Location of Pain Severity and Description of Pain Patient Has Paino No Site Locations With Dressing Change: No Pain Management and Medication Current Pain Management: Electronic Signature(s) Signed: 02/21/2017 4:53:33 PM By: Alric Quan Entered By: Alric Quan on 02/21/2017 08:53:21 Brooke Hopkins ([id number]) -------------------------------------------------------------------------------- Patient/Caregiver Education Details Patient Name: Brooke Hopkins, Brooke Hopkins. Date of Service: 02/21/2017 8:45 AM Medical Record Number: [id number] Patient Account Number: [id number] Date of Birth/Gender: 05/15/1951 (66 y.o. Female) Treating RN: Ahmed Prima Primary Care Physician: FITZGERALD, DAVID Other Clinician: Referring Physician: FITZGERALD, DAVID Treating Physician/Extender: Frann Rider in Treatment: 63 Education Assessment Education Provided To: Patient Education Topics Provided Wound/Skin Impairment: Handouts: Other: change dressing as ordered Methods: Demonstration, Explain/Verbal Responses: State content correctly Electronic Signature(s) Signed: 02/21/2017 4:53:33 PM By: Alric Quan Entered By: Alric Quan on 02/21/2017 09:18:31 Brooke Hopkins ([id number]) -------------------------------------------------------------------------------- Wound Assessment Details Patient Name: Brooke Hopkins, Brooke Hopkins. Date of Service: 02/21/2017 8:45 AM Medical Record Number: [id number] Patient Account Number: [id number] Date of Birth/Sex: Mar 18, 1951 (66 y.o.  Female) Treating RN: Ahmed Prima Primary Care Kalden Wanke: FITZGERALD, DAVID Other Clinician: Referring Deakin Lacek: FITZGERALD, DAVID Treating Adante Courington/Extender: Frann Rider in Treatment: 35 Wound Status Wound Number: 1 Primary Etiology: Lymphedema Wound Location: Left Lower Leg - Posterior Wound Status: Open Wounding Event: Gradually Appeared Date Acquired: 12/31/2015 Weeks Of Treatment: 35 Clustered Wound: No Photos Photo Uploaded By: Alric Quan on 02/21/2017 14:06:30 Wound Measurements Length: (cm) 2.4 Width: (cm) 0.5 Depth: (cm) 0.1 Area: (cm) 0.942 Volume: (cm) 0.094 % Reduction in Area: 99.1% % Reduction in Volume: 99.5% Epithelialization: Medium (34-66%) Tunneling: No  Undermining: No Wound Description Classification: Partial Thickness Wound Margin: Flat and Intact Exudate Amount: Large Exudate Type: Serous Exudate Color: amber Foul Odor After Cleansing: Yes Due to Product Use: No Slough/Fibrino Yes Wound Bed Granulation Amount: Large (67-100%) Exposed Structure Granulation Quality: Red Fascia Exposed: No Necrotic Amount: Small (1-33%) Fat Layer (Subcutaneous Tissue) Exposed: No Necrotic Quality: Adherent Slough Tendon Exposed: No JAMAIYA, TUNNELL ([id number]) Muscle Exposed: No Joint Exposed: No Bone Exposed: No Limited to Skin Breakdown Periwound Skin Texture Texture Color No Abnormalities Noted: No No Abnormalities Noted: No Callus: No Atrophie Blanche: No Crepitus: No Cyanosis: No Excoriation: Yes Ecchymosis: No Induration: No Erythema: No Rash: No Hemosiderin Staining: No Scarring: No Mottled: No Pallor: No Moisture Rubor: Yes No Abnormalities Noted: No Dry / Scaly: No Temperature / Pain Maceration: Yes Temperature: No Abnormality Tenderness on Palpation: Yes Wound Preparation Ulcer Cleansing: Other: soap and water, Topical Anesthetic Applied: Other: lidocaine 4%, Treatment Notes Wound #1 (Left, Posterior  Lower Leg) 1. Cleansed with: Clean wound with Normal Saline Cleanse wound with antibacterial soap and water 2. Anesthetic Topical Lidocaine 4% cream to wound bed prior to debridement 4. Dressing Applied: Prisma Ag 5. Secondary Dressing Applied ABD Pad Dry Gauze 7. Secured with Tape 3 Layer Compression System - Bilateral Electronic Signature(s) Signed: 02/21/2017 4:53:33 PM By: Alric Quan Entered By: Alric Quan on 02/21/2017 09:03:56 Brooke Hopkins ([id number]) -------------------------------------------------------------------------------- Wound Assessment Details Patient Name: Brooke Hopkins, Brooke Hopkins. Date of Service: 02/21/2017 8:45 AM Medical Record Number: [id number] Patient Account Number: [id number] Date of Birth/Sex: 02-Jul-1951 (66 y.o. Female) Treating RN: Ahmed Prima Primary Care Jeriann Sayres: FITZGERALD, DAVID Other Clinician: Referring Emile Ringgenberg: FITZGERALD, DAVID Treating Millena Callins/Extender: Frann Rider in Treatment: 35 Wound Status Wound Number: 2 Primary Etiology: Lymphedema Wound Location: Right Lower Leg - Posterior Wound Status: Open Wounding Event: Gradually Appeared Date Acquired: 12/31/2015 Weeks Of Treatment: 35 Clustered Wound: Yes Photos Photo Uploaded By: Alric Quan on 02/21/2017 14:06:31 Wound Measurements Length: (cm) 0.5 Width: (cm) 0.7 Depth: (cm) 0.1 Clustered Quantity: 3 Area: (cm) 0.275 Volume: (cm) 0.027 % Reduction in Area: 99.9% % Reduction in Volume: 99.9% Epithelialization: Small (1-33%) Tunneling: No Undermining: No Wound Description Classification: Partial Thickness Wound Margin: Indistinct, nonvisible Exudate Amount: Large Exudate Type: Serous Exudate Color: amber Foul Odor After Cleansing: Yes Due to Product Use: No Slough/Fibrino Yes Wound Bed Granulation Amount: Medium (34-66%) Exposed Structure Granulation Quality: Red Fascia Exposed: No Necrotic Amount: Medium (34-66%) Fat Layer  (Subcutaneous Tissue) Exposed: No Brooke Hopkins, TIDMORE ([id number]) Necrotic Quality: Adherent Slough Tendon Exposed: No Muscle Exposed: No Joint Exposed: No Bone Exposed: No Limited to Skin Breakdown Periwound Skin Texture Texture Color No Abnormalities Noted: No No Abnormalities Noted: No Callus: No Atrophie Blanche: No Crepitus: No Cyanosis: No Excoriation: Yes Ecchymosis: No Induration: No Erythema: Yes Rash: No Erythema Location: Circumferential Scarring: No Hemosiderin Staining: Yes Mottled: No Moisture Pallor: No No Abnormalities Noted: No Rubor: Yes Dry / Scaly: No Maceration: Yes Temperature / Pain Temperature: No Abnormality Tenderness on Palpation: Yes Wound Preparation Ulcer Cleansing: Other: soap and water, Topical Anesthetic Applied: Other: lidocaine 4%, Treatment Notes Wound #2 (Right, Posterior Lower Leg) 1. Cleansed with: Clean wound with Normal Saline Cleanse wound with antibacterial soap and water 2. Anesthetic Topical Lidocaine 4% cream to wound bed prior to debridement 4. Dressing Applied: Prisma Ag 5. Secondary Dressing Applied ABD Pad Dry Gauze 7. Secured with Tape 3 Layer Compression System - Bilateral Electronic Signature(s) Signed: 02/21/2017 4:53:33 PM By: Alric Quan Entered By: Alric Quan  on 02/21/2017 09:04:30 SHAKEVIA, SARRIS ([id number]) OLEAN, SANGSTER ([id number]) -------------------------------------------------------------------------------- Wound Assessment Details Patient Name: SHEKITA, BOYDEN. Date of Service: 02/21/2017 8:45 AM Medical Record Number: [id number] Patient Account Number: [id number] Date of Birth/Sex: May 13, 1951 (66 y.o. Female) Treating RN: Ahmed Prima Primary Care Gizelle Whetsel: FITZGERALD, DAVID Other Clinician: Referring Trendon Zaring: FITZGERALD, DAVID Treating Telisha Zawadzki/Extender: Frann Rider in Treatment: 35 Wound Status Wound Number: 3 Primary Etiology: Pressure  Ulcer Wound Location: Right Calcaneus Wound Status: Open Wounding Event: Pressure Injury Date Acquired: 01/24/2017 Weeks Of Treatment: 2 Clustered Wound: No Photos Photo Uploaded By: Alric Quan on 02/21/2017 14:06:46 Wound Measurements Length: (cm) 0.8 Width: (cm) 0.8 Depth: (cm) 0.1 Area: (cm) 0.503 Volume: (cm) 0.05 % Reduction in Area: 90.9% % Reduction in Volume: 95.5% Epithelialization: None Tunneling: No Undermining: No Wound Description Classification: Category/Stage II Foul Odor Aft Wound Margin: Distinct, outline attached Slough/Fibrin Exudate Amount: Large Exudate Type: Serosanguineous Exudate Color: red, brown er Cleansing: No o No Wound Bed Granulation Amount: Large (67-100%) Exposed Structure Granulation Quality: Red, Pink Fascia Exposed: No Necrotic Amount: Small (1-33%) Fat Layer (Subcutaneous Tissue) Exposed: No Necrotic Quality: [address] Fayth M. ([id number]) Periwound Skin Texture Texture Color No Abnormalities Noted: No No Abnormalities Noted: No Moisture Temperature / Pain No Abnormalities Noted: No Temperature: No Abnormality Maceration: Yes Tenderness on Palpation: Yes Wound Preparation Ulcer Cleansing: Rinsed/Irrigated with Saline Topical Anesthetic Applied: Other: LIDOCAINE 4%, Treatment Notes Wound #3 (Right Calcaneus) 1. Cleansed with: Clean wound with Normal Saline Cleanse wound with antibacterial soap and water 2. Anesthetic Topical Lidocaine 4% cream to wound bed prior to debridement 4. Dressing Applied: Prisma Ag 5. Secondary Dressing Applied Dry Gauze 7. Secured with Tape Notes foam heel cup Electronic Signature(s) Signed: 02/21/2017 4:53:33 PM By: Alric Quan Entered By: Alric Quan on 02/21/2017 09:05:20 Brooke Hopkins ([id number]) -------------------------------------------------------------------------------- Vitals Details Patient Name: TIAJAH, OYSTER. Date of Service:  02/21/2017 8:45 AM Medical Record Number: [id number] Patient Account Number: [id number] Date of Birth/Sex: 06/14/1951 (66 y.o. Female) Treating RN: Ahmed Prima Primary Care Lord Lancour: FITZGERALD, DAVID Other Clinician: Referring Etheridge Geil: FITZGERALD, DAVID Treating Zuhair Lariccia/Extender: Frann Rider in Treatment: 35 Vital Signs Time Taken: 08:54 Temperature (F): 97.9 Height (in): 67 Pulse (bpm): 86 Weight (lbs): 300 Respiratory Rate (breaths/min): 18 Body Mass Index (BMI): 47 Blood Pressure (mmHg): 177/63 Reference Range: 80 - 120 mg / dl Electronic Signature(s) Signed: 02/21/2017 4:53:33 PM By: Alric Quan Entered By: Alric Quan on 02/21/2017 09:03:03

## 2017-02-28 ENCOUNTER — Encounter: Payer: 59 | Attending: Nurse Practitioner | Admitting: Surgery

## 2017-02-28 DIAGNOSIS — I10 Essential (primary) hypertension: Secondary | ICD-10-CM | POA: Diagnosis not present

## 2017-02-28 DIAGNOSIS — L97222 Non-pressure chronic ulcer of left calf with fat layer exposed: Secondary | ICD-10-CM | POA: Diagnosis not present

## 2017-02-28 DIAGNOSIS — L89612 Pressure ulcer of right heel, stage 2: Secondary | ICD-10-CM | POA: Insufficient documentation

## 2017-02-28 DIAGNOSIS — E11622 Type 2 diabetes mellitus with other skin ulcer: Secondary | ICD-10-CM | POA: Diagnosis not present

## 2017-02-28 DIAGNOSIS — L97212 Non-pressure chronic ulcer of right calf with fat layer exposed: Secondary | ICD-10-CM | POA: Insufficient documentation

## 2017-02-28 DIAGNOSIS — I890 Lymphedema, not elsewhere classified: Secondary | ICD-10-CM | POA: Insufficient documentation

## 2017-02-28 DIAGNOSIS — Z87891 Personal history of nicotine dependence: Secondary | ICD-10-CM | POA: Diagnosis not present

## 2017-02-28 DIAGNOSIS — E1140 Type 2 diabetes mellitus with diabetic neuropathy, unspecified: Secondary | ICD-10-CM | POA: Insufficient documentation

## 2017-02-28 DIAGNOSIS — Z6841 Body Mass Index (BMI) 40.0 and over, adult: Secondary | ICD-10-CM | POA: Diagnosis not present

## 2017-03-01 NOTE — Progress Notes (Signed)
Brooke Hopkins ([id number]) Visit Report for 02/28/2017 Chief Complaint Document Details Patient Name: Brooke Hopkins, Brooke Hopkins. Date of Service: 02/28/2017 8:45 AM Medical Record Number: [id number] Patient Account Number: [id number] Date of Birth/Sex: May 18, 1951 (66 y.o. Female) Treating RN: Ahmed Prima Primary Care Provider: FITZGERALD, DAVID Other Clinician: Referring Provider: FITZGERALD, DAVID Treating Provider/Extender: Frann Rider in Treatment: 33 Information Obtained from: Patient Chief Complaint Brooke Hopkins returns for follow-up to her bilateral lower extremity ulcers Electronic Signature(s) Signed: 02/28/2017 9:19:49 AM By: Christin Fudge MD, FACS Entered By: Christin Fudge on 02/28/2017 09:19:49 Brooke Hopkins ([id number]) -------------------------------------------------------------------------------- Debridement Details Patient Name: Brooke Hopkins, Brooke Hopkins. Date of Service: 02/28/2017 8:45 AM Medical Record Number: [id number] Patient Account Number: [id number] Date of Birth/Sex: May 24, 1951 (66 y.o. Female) Treating RN: Ahmed Prima Primary Care Provider: FITZGERALD, DAVID Other Clinician: Referring Provider: FITZGERALD, DAVID Treating Provider/Extender: Frann Rider in Treatment: 36 Debridement Performed for Wound #1 Left,Posterior Lower Leg Assessment: Performed By: Physician Christin Fudge, MD Debridement: Debridement Pre-procedure Yes - 09:13 Verification/Time Out Taken: Start Time: 09:14 Pain Control: Lidocaine 4% Topical Solution Level: Skin/Subcutaneous Tissue Total Area Debrided (L x 2.4 (cm) x 0.4 (cm) = 0.96 (cm) W): Tissue and other Viable, Non-Viable, Exudate, Fibrin/Slough, Subcutaneous material debrided: Instrument: Curette Bleeding: Minimum Hemostasis Achieved: Pressure End Time: 09:15 Procedural Pain: 0 Post Procedural Pain: 0 Response to Treatment: Procedure was tolerated well Post Debridement Measurements of Total  Wound Length: (cm) 2.4 Width: (cm) 0.4 Depth: (cm) 0.1 Volume: (cm) 0.075 Character of Wound/Ulcer Post Requires Further Debridement Debridement: Severity of Tissue Post Debridement: Fat layer exposed Post Procedure Diagnosis Same as Pre-procedure Electronic Signature(s) Signed: 02/28/2017 9:19:25 AM By: Christin Fudge MD, FACS Signed: 02/28/2017 4:10:42 PM By: Alric Quan Entered By: Christin Fudge on 02/28/2017 09:19:25 Brooke Hopkins ([id number]) Brooke Hopkins ([id number]) -------------------------------------------------------------------------------- Debridement Details Patient Name: Brooke Hopkins, Brooke Hopkins. Date of Service: 02/28/2017 8:45 AM Medical Record Number: [id number] Patient Account Number: [id number] Date of Birth/Sex: [birth year] (66 y.o. Female) Treating RN: Ahmed Prima Primary Care Provider: FITZGERALD, DAVID Other Clinician: Referring Provider: FITZGERALD, DAVID Treating Provider/Extender: Frann Rider in Treatment: 36 Debridement Performed for Wound #2 Right,Posterior Lower Leg Assessment: Performed By: Physician Christin Fudge, MD Debridement: Debridement Pre-procedure Yes - 09:13 Verification/Time Out Taken: Start Time: 09:17 Pain Control: Lidocaine 4% Topical Solution Level: Skin/Subcutaneous Tissue Total Area Debrided (L x 0.3 (cm) x 0.3 (cm) = 0.09 (cm) W): Tissue and other Viable, Non-Viable, Exudate, Fibrin/Slough, Subcutaneous material debrided: Instrument: Curette Bleeding: Minimum Hemostasis Achieved: Pressure End Time: 09:18 Procedural Pain: 0 Post Procedural Pain: 0 Response to Treatment: Procedure was tolerated well Post Debridement Measurements of Total Wound Length: (cm) 0.3 Width: (cm) 0.3 Depth: (cm) 0.1 Volume: (cm) 0.007 Character of Wound/Ulcer Post Requires Further Debridement Debridement: Severity of Tissue Post Debridement: Fat layer exposed Post Procedure Diagnosis Same as Pre-procedure Electronic  Signature(s) Signed: 02/28/2017 9:19:33 AM By: Christin Fudge MD, FACS Signed: 02/28/2017 4:10:42 PM By: Alric Quan Entered By: Christin Fudge on 02/28/2017 09:19:33 Brooke Hopkins ([id number]) Brooke Hopkins ([id number]) -------------------------------------------------------------------------------- Debridement Details Patient Name: Brooke Hopkins, Brooke Hopkins. Date of Service: 02/28/2017 8:45 AM Medical Record Number: [id number] Patient Account Number: [id number] Date of Birth/Sex: [birth year] (66 y.o. Female) Treating RN: Ahmed Prima Primary Care Provider: FITZGERALD, DAVID Other Clinician: Referring Provider: FITZGERALD, DAVID Treating Provider/Extender: Frann Rider in Treatment: 36 Debridement Performed for Wound #3 Right Calcaneus Assessment: Performed By: Physician Christin Fudge, MD Debridement: Debridement Pre-procedure Yes - 09:13 Verification/Time Out Taken: Start Time: 09:15 Pain Control:  Lidocaine 4% Topical Solution Level: Skin/Subcutaneous Tissue Total Area Debrided (L x 0.5 (cm) x 0.8 (cm) = 0.4 (cm) W): Tissue and other Viable, Non-Viable, Exudate, Fibrin/Slough, Subcutaneous material debrided: Instrument: Curette Bleeding: Minimum Hemostasis Achieved: Pressure End Time: 09:16 Procedural Pain: 0 Post Procedural Pain: 0 Response to Treatment: Procedure was tolerated well Post Debridement Measurements of Total Wound Length: (cm) 0.5 Stage: Category/Stage II Width: (cm) 0.8 Depth: (cm) 0.1 Volume: (cm) 0.031 Character of Wound/Ulcer Post Requires Further Debridement: Debridement Severity of Tissue Post Fat layer exposed Debridement: Post Procedure Diagnosis Same as Pre-procedure Electronic Signature(s) Signed: 02/28/2017 9:19:40 AM By: Christin Fudge MD, FACS Signed: 02/28/2017 4:10:42 PM By: Alric Quan Entered By: Christin Fudge on 02/28/2017 09:19:40 Brooke Hopkins ([id number]) Brooke Hopkins  ([id number]) -------------------------------------------------------------------------------- HPI Details Patient Name: Brooke Hopkins, Brooke Hopkins. Date of Service: 02/28/2017 8:45 AM Medical Record Number: [id number] Patient Account Number: [id number] Date of Birth/Sex: 10/22/1951 (66 y.o. Female) Treating RN: Ahmed Prima Primary Care Provider: FITZGERALD, DAVID Other Clinician: Referring Provider: FITZGERALD, DAVID Treating Provider/Extender: Frann Rider in Treatment: 55 History of Present Illness Location: massive swelling of both lower extremities and ulceration both lower extremities Quality: Patient reports experiencing a dull pain to affected area(s). Severity: Patient states wound are getting worse. Duration: Patient has had the wound for >2 years prior to seeking treatment at the wound center Timing: Pain in wound is constant (hurts all the time) Context: The wound appeared gradually over time Modifying Factors: Other treatment(s) tried include:she has a lymphedema pump but uses it seldom and she's had several course of antibiotics Associated Signs and Symptoms: Patient reports having difficulty standing for long periods. HPI Description: 66 year old patient seen by Dr. Ola Spurr of infectious disease who has been following up for left lower extremity cellulitis and ulcer with recurrent bilateral lower extremity problems for several months. Recently she had a large right lower extremity bullae which opened out and has been ulcerated. She has seen the vascular group and has been getting Unna's wraps and has a lymphedema pump used in the past. Her prior cultures were positive for Pseudomonas, Proteus and was treated with amoxicillin. He has also been treated with 2 weeks course of ciprofloxacin and amoxicillin.. Increase of Lasix dose helped with the edema and echo showed no systolic CHF but may be diastolic problems. Past medical history significant for diabetes mellitus type 2,  venous stasis ulcer, obesity, diabetic peripheral neuropathy, status post back surgery, cholecystectomy, hysterectomy, arthroscopy of the knee. He is a former smoker and quit smoking in 1984. The patient has seen Dr. Delana Meyer who did not recommend any arterial or venous duplex studies and has been using Unna's wraps and also recommended a lymphedema pump. she has been very noncompliant with using these. 06/28/2016 -- the patient is still on antibiotics as prescribed by Dr. Ola Spurr and he is asked her to take it for 3 weeks. The patient also says she has a lot of redness and pain in the folds of her thigh and lower extremity and this is very painful. 07/26/2016 -- the patient is off antibiotics and has been getting dressing changes 3 times a week. 08/09/2016 -- is been on Cipro and is taking potassium supplements along with her Lasix and is going to be seeing Dr. Ola Spurr for a consult return visit only in November 2017. 08/23/2016 -- she was admitted to the hospital last week and I have reviewed these reports in detail. She was seen by Dr. Ola Spurr who noted a Pseudomonas infection which  was resistant to ciprofloxacin and discharged her on Ceftazidime 1 g IV every 12 hourly anyone days. The antibiotic was to be stopped on September 11 and he would see her back in the clinic. 08/30/2016 -- had a communication from Dr. Ola Spurr that he would extend her antibiotics by a week if she continued to look like cellulitis was persisting. ELLA, GOLOMB ([id number]) 09/13/2016 -- the PICC line is out and antibiotics have stopped. 10/04/16: returns today for f/u. reports utilizes compression pump twice daily. she is compliant with her compression therapy. she reports a new blister on the right proximal posterior calf. no systemic s/s of infection. 10/11/16: returns today for f/u. she reports adherence to her compression pumps, but there is no improvement regarding her BLE edema. there is  weeping of fluid. denies fever, chills, body aches or malaise. 10/18/2016 -- she has a significant cellulitis of her right lower extremity which was not there last week. I believe at this stage she will need the expertise of Dr. Ola Spurr to decide on antibiotic coverage. 10/25/2016 -- the patient has had cipro and ampicillin been started by Dr. Ola Spurr and he is awaiting further cultures. Overall she's been feeling better. 11/08/2016 -- Dr. Ola Spurr this week who has continued with ciprofloxacin and amoxicillin and is awaiting further cultures before deciding to place her on a PICC line. 11/15/2016 -- Dr. Blane Ohara note was reviewed and her cultures growing Pseudomonas in both legs which is sensitive to Cipro and the other is resistant to Cipro and she would get a PICC line and start IV antibiotics -- Ceftazidime 1 g q 12 for 3 weeks. 11-29-16 Mrs. Dimalanta presents today in follow-up of her bilateral lower exterminators ulcers. She remains on IV antibiotic therapy via a PICC line per Dr. Ola Spurr; she has a follow-up appointment with him on Monday, 12/02/2016. She anticipates that the about therapy will be discontinued at that appointment. She denies any complications of nausea vomiting and/or diarrhea accompanied by this antibiotic therapy. She admits to using her lymphedema pumps twice daily with the exception of yesterday. She denies any concerns or complications with the current treatment plan. 12/06/2016 -- Dr. Ola Spurr saw her recently on 12/02/2016 and due to the fact she has had significant problems with recurrent ulceration and Pseudomonas infection he recommended 3 more weeks of antibiotics and extended it until December 25. The PICC line will be in place. Last hemoglobin A1c on November 30 was 9% 12/27/2016 -- because of the holidays the patient's diet has been higher in salt, her dressings have not been done as required and she is not used to lymphedema pumps  appropriately. This has led to her lymphedema increasing markedly. 01/24/2017 -- the patient's hospital bed is malfunctioning and hence her legs have been flat in bed and her lymphedema is increased markedly. During my examination I also noted a unstageable pressure injury to her right heel 02/07/2017 -- she has returned after 2 weeks as she was done with influenza and has been in bed a lot, with a new bed which has helped her reduce the edema due to proper elevation. Will she has a full-fledged large pressure injury to the right heel. 02/28/2017 -- hilar dressing was being taken down with scissors she had a sharp injury to the left medial calf area which is fairly superficial. Electronic Signature(s) Signed: 02/28/2017 9:20:41 AM By: Christin Fudge MD, FACS Entered By: Christin Fudge on 02/28/2017 09:20:41 Brooke Hopkins ([id number]) -------------------------------------------------------------------------------- Physical Exam Details Patient Name: Brooke Hopkins, Brooke Hopkins.  Date of Service: 02/28/2017 8:45 AM Medical Record Number: [id number] Patient Account Number: [id number] Date of Birth/Sex: January 09, 1951 (66 y.o. Female) Treating RN: Ahmed Prima Primary Care Provider: FITZGERALD, DAVID Other Clinician: Referring Provider: FITZGERALD, DAVID Treating Provider/Extender: Frann Rider in Treatment: 36 Constitutional . Pulse regular. Respirations normal and unlabored. Afebrile. . Eyes Nonicteric. Reactive to light. Ears, Nose, Mouth, and Throat Lips, teeth, and gums WNL.Marland Kitchen Moist mucosa without lesions. Neck supple and nontender. No palpable supraclavicular or cervical adenopathy. Normal sized without goiter. Respiratory WNL. No retractions.. Breath sounds WNL, No rubs, rales, rhonchi, or wheeze.. Cardiovascular Heart rhythm and rate regular, no murmur or gallop.. Pedal Pulses WNL. No clubbing, cyanosis or edema. Chest Breasts symmetical and no nipple discharge.. Breast tissue WNL,  no masses, lumps, or tenderness.. Lymphatic No adneopathy. No adenopathy. No adenopathy. Musculoskeletal Adexa without tenderness or enlargement.. Digits and nails w/o clubbing, cyanosis, infection, petechiae, ischemia, or inflammatory conditions.. Integumentary (Hair, Skin) No suspicious lesions. No crepitus or fluctuance. No peri-wound warmth or erythema. No masses.Marland Kitchen Psychiatric Judgement and insight Intact.. No evidence of depression, anxiety, or agitation.. Notes there has been excellent resolution of her lymphedema and the wounds have minimal subcutaneous tenderness debris which are sharply removed with a #3 curet. The new wound is fairly superficial and we will address this appropriately Electronic Signature(s) Signed: 02/28/2017 9:21:14 AM By: Christin Fudge MD, FACS Entered By: Christin Fudge on 02/28/2017 09:21:13 Brooke Hopkins ([id number]) -------------------------------------------------------------------------------- Physician Orders Details Patient Name: Brooke Hopkins, Brooke Hopkins. Date of Service: 02/28/2017 8:45 AM Medical Record Number: [id number] Patient Account Number: [id number] Date of Birth/Sex: Nov 11, 1951 (66 y.o. Female) Treating RN: Ahmed Prima Primary Care Provider: FITZGERALD, DAVID Other Clinician: Referring Provider: FITZGERALD, DAVID Treating Provider/Extender: Frann Rider in Treatment: 42 Verbal / Phone Orders: Yes Clinician: Carolyne Fiscal, Debi Read Back and Verified: Yes Diagnosis Coding Wound Cleansing Wound #1 Left,Posterior Lower Leg o Clean wound with Normal Saline. - clinic use o Cleanse wound with mild soap and water - HHRN please scrub wounds with mild soap and water o May Shower, gently pat wound dry prior to applying new dressing. o May shower with protection. Wound #4 Left,Anterior Lower Leg o Clean wound with Normal Saline. - clinic use o Cleanse wound with mild soap and water - HHRN please scrub wounds with mild soap and  water o May Shower, gently pat wound dry prior to applying new dressing. o May shower with protection. Wound #2 Right,Posterior Lower Leg o Clean wound with Normal Saline. - clinic use o Cleanse wound with mild soap and water - HHRN please scrub wounds with mild soap and water o May Shower, gently pat wound dry prior to applying new dressing. o May shower with protection. Wound #3 Right Calcaneus o Clean wound with Normal Saline. - clinic use o Cleanse wound with mild soap and water - HHRN please scrub wounds with mild soap and water o May Shower, gently pat wound dry prior to applying new dressing. o May shower with protection. Anesthetic Wound #1 Left,Posterior Lower Leg o Topical Lidocaine 4% cream applied to wound bed prior to debridement - clinic use Wound #4 Left,Anterior Lower Leg o Topical Lidocaine 4% cream applied to wound bed prior to debridement - clinic use Wound #2 Right,Posterior Lower Leg o Topical Lidocaine 4% cream applied to wound bed prior to debridement - clinic use Wound #3 Right Calcaneus o Topical Lidocaine 4% cream applied to wound bed prior to debridement - clinic use Brooke Hopkins, Brooke Hopkins. ([id number]) Primary Wound Dressing  Wound #1 Left,Posterior Lower Leg o Prisma Ag - moisten with saline Wound #4 Left,Anterior Lower Leg o Prisma Ag - moisten with saline Wound #2 Right,Posterior Lower Leg o Prisma Ag - moisten with saline Wound #3 Right Calcaneus o Prisma Ag - moisten with saline Secondary Dressing Wound #1 Left,Posterior Lower Leg o ABD pad o Dry Gauze Wound #4 Left,Anterior Lower Leg o ABD pad o Dry Gauze Wound #2 Right,Posterior Lower Leg o ABD pad o Dry Gauze Wound #3 Right Calcaneus o Dry Gauze o Other - FOAM HEEL CUP Dressing Change Frequency Wound #1 Left,Posterior Lower Leg o Change Dressing Monday, Wednesday, Friday - HHRN to change wraps Monday and Wednesday Wound #4 Left,Anterior  Lower Leg o Change Dressing Monday, Wednesday, Friday - HHRN to change wraps Monday and Wednesday Wound #2 Right,Posterior Lower Leg o Change Dressing Monday, Wednesday, Friday - HHRN to change wraps Monday and Wednesday o Change Dressing Monday, Wednesday, Friday - HHRN to change wraps Monday and Wednesday Follow-up Appointments Wound #1 Left,Posterior Lower Leg o Return Appointment in 1 week. Wound #4 Left,Anterior Lower Leg o Return Appointment in 1 week. Brooke Hopkins, Brooke Hopkins ([id number]) Wound #2 Right,Posterior Lower Leg o Return Appointment in 1 week. Edema Control Wound #1 Left,Posterior Lower Leg o 3 Layer Compression System - Bilateral - May anchor top of wrap with dome paste, but only wrap around leg one time. o Elevate legs to the level of the heart and pump ankles as often as possible o Compression Pump: Use compression pump on left lower extremity for 30 minutes, twice daily. o Compression Pump: Use compression pump on right lower extremity for 30 minutes, twice daily. Wound #4 Left,Anterior Lower Leg o 3 Layer Compression System - Bilateral - May anchor top of wrap with dome paste, but only wrap around leg one time. o Elevate legs to the level of the heart and pump ankles as often as possible o Compression Pump: Use compression pump on left lower extremity for 30 minutes, twice daily. o Compression Pump: Use compression pump on right lower extremity for 30 minutes, twice daily. Wound #2 Right,Posterior Lower Leg o 3 Layer Compression System - Bilateral - May anchor top of wrap with dome paste, but only wrap around leg one time. o Elevate legs to the level of the heart and pump ankles as often as possible o Compression Pump: Use compression pump on left lower extremity for 30 minutes, twice daily. o Compression Pump: Use compression pump on right lower extremity for 30 minutes, twice daily. Additional Orders / Instructions o  Increase protein intake. Wound #2 Right,Posterior Lower Leg o Increase protein intake. Wound #4 Left,Anterior Lower Leg o Increase protein intake. Home Health Wound #1 [place] Visits - [place] Nurse may visit PRN to address patientos wound care needs. o FACE TO FACE ENCOUNTER: MEDICARE and MEDICAID PATIENTS: I certify that this patient is under my care and that I had a face-to-face encounter that meets the physician face-to-face encounter requirements with this patient on this date. The encounter with the patient was in whole or in part for the following MEDICAL CONDITION: (primary reason for [place]) Brooke Hopkins, Brooke Hopkins ([id number]) MEDICAL NECESSITY: I certify, that based on my findings, NURSING services are a medically necessary home health service. HOME BOUND STATUS: I certify that my clinical findings support that this patient is homebound (i.e., Due to illness or injury, pt requires aid of supportive devices such as crutches, cane, wheelchairs,  walkers, the use of special transportation or the assistance of another person to leave their place of residence. There is a normal inability to leave the home and doing so requires considerable and taxing effort. Other absences are for medical reasons / religious services and are infrequent or of short duration when for other reasons). o Please direct any NON-WOUND related issues/requests for orders to patient's Primary Care Physician Wound #4 [place] Visits - [place] Nurse may visit PRN to address patientos wound care needs. o FACE TO FACE ENCOUNTER: MEDICARE and MEDICAID PATIENTS: I certify that this patient is under my care and that I had a face-to-face encounter that meets the physician face-to-face encounter requirements with this patient on this date. The encounter with the patient was in whole or in  part for the following MEDICAL CONDITION: (primary reason for [place]) MEDICAL NECESSITY: I certify, that based on my findings, NURSING services are a medically necessary home health service. HOME BOUND STATUS: I certify that my clinical findings support that this patient is homebound (i.e., Due to illness or injury, pt requires aid of supportive devices such as crutches, cane, wheelchairs, walkers, the use of special transportation or the assistance of another person to leave their place of residence. There is a normal inability to leave the home and doing so requires considerable and taxing effort. Other absences are for medical reasons / religious services and are infrequent or of short duration when for other reasons). o Please direct any NON-WOUND related issues/requests for orders to patient's Primary Care Physician Wound #2 [place] Visits - [place] Nurse may visit PRN to address patientos wound care needs. o FACE TO FACE ENCOUNTER: MEDICARE and MEDICAID PATIENTS: I certify that this patient is under my care and that I had a face-to-face encounter that meets the physician face-to-face encounter requirements with this patient on this date. The encounter with the patient was in whole or in part for the following MEDICAL CONDITION: (primary reason for [place]) MEDICAL NECESSITY: I certify, that based on my findings, NURSING services are a medically necessary home health service. HOME BOUND STATUS: I certify that my clinical findings support that this patient is homebound (i.e., Due to illness or injury, pt requires aid of supportive devices such as crutches, cane, wheelchairs, walkers, the use of special transportation or the assistance of another person to leave their place of residence. There is a normal inability to leave the home and doing so requires considerable and taxing effort. Other absences are for  medical reasons / religious services and are infrequent or of short duration when for other reasons). o Please direct any NON-WOUND related issues/requests for orders to patient's Primary Care Physician Electronic Signature(s) Signed: 02/28/2017 3:38:42 PM By: Christin Fudge MD, FACS Brooke Hopkins, Brooke Hopkins ([id number]) Signed: 02/28/2017 4:10:42 PM By: Alric Quan Entered By: Alric Quan on 02/28/2017 09:18:07 Brooke Hopkins ([id number]) -------------------------------------------------------------------------------- Problem List Details Patient Name: Brooke Hopkins, TSE. Date of Service: 02/28/2017 8:45 AM Medical Record Number: [id number] Patient Account Number: [id number] Date of Birth/Sex: 1951/07/29 (66 y.o. 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 Date of Service: 02/28/2017 8:45 AM Medical Record Number: [id number] Patient Account Number: [id number] Date of Birth/Sex: [birth year] (66 y.o.  Female) Treating RN: Ahmed Prima Primary Care Provider: FITZGERALD, DAVID Other Clinician: Referring Provider: FITZGERALD, DAVID Treating Provider/Extender: Frann Rider in Treatment: 64 Subjective Chief Complaint Information obtained from Patient Mrs. Stemmler returns for follow-up to her bilateral lower extremity ulcers History of Present Illness (HPI) The following HPI elements were documented for the patient's wound: Location: massive swelling of both lower extremities and ulceration both lower extremities Quality: Patient reports experiencing a dull pain to affected area(s). Severity: Patient states wound are getting worse. Duration: Patient has had the wound for >2 years prior to seeking treatment at the wound center Timing: Pain in wound is constant (hurts all the time) Context: The wound appeared gradually over time Modifying Factors: Other treatment(s) tried include:she has a lymphedema pump but uses it seldom and she's had several course of antibiotics Associated Signs and Symptoms: Patient reports having difficulty standing for long periods. 66 year old patient seen by Dr. Ola Spurr of infectious disease who has been following up for left lower extremity cellulitis and ulcer with recurrent bilateral lower extremity problems for several months. Recently she had a large right lower extremity bullae which opened out and has been ulcerated. She has seen the vascular group and has been getting Unna's wraps and has a lymphedema pump used in the past. Her prior cultures were positive for Pseudomonas, Proteus and was treated with amoxicillin. He has also been treated with 2 weeks course of ciprofloxacin and amoxicillin.. Increase of Lasix dose helped with the edema and echo showed no systolic CHF but may be diastolic problems. Past medical history significant for diabetes mellitus type 2, venous stasis ulcer, obesity, diabetic peripheral neuropathy, status post back  surgery, cholecystectomy, hysterectomy, arthroscopy of the knee. He is a former smoker and quit smoking in 1984. The patient has seen Dr. Delana Meyer who did not recommend any arterial or venous duplex studies and has been using Unna's wraps and also recommended a lymphedema pump. she has been very noncompliant with using these. 06/28/2016 -- the patient is still on antibiotics as prescribed by Dr. Ola Spurr and he is asked her to take it for 3 weeks. The patient also says she has a lot of redness and pain in the folds of her thigh and lower extremity and this is very painful. 07/26/2016 -- the patient is off antibiotics and has been getting dressing changes 3 times a week. 08/09/2016 -- is been on Cipro and is taking potassium supplements along with her Lasix and is going to be seeing Dr. Ola Spurr for a consult return visit only in November 2017. MISHAAL, LANSDALE ([id number]) 08/23/2016 -- she was admitted to the hospital last week and I have reviewed these reports in detail. She was seen by Dr. Ola Spurr who noted a Pseudomonas infection which was resistant to ciprofloxacin and discharged her on Ceftazidime 1 g IV every 12 hourly anyone days. The antibiotic was to be stopped on September 11 and he would see her back in the clinic. 08/30/2016 --  had a communication from Dr. Ola Spurr that he would extend her antibiotics by a week if she continued to look like cellulitis was persisting. 09/13/2016 -- the PICC line is out and antibiotics have stopped. 10/04/16: returns today for f/u. reports utilizes compression pump twice daily. she is compliant with her compression therapy. she reports a new blister on the right proximal posterior calf. no systemic s/s of infection. 10/11/16: returns today for f/u. she reports adherence to her compression pumps, but there is no improvement regarding her BLE edema. there is weeping of fluid. denies fever, chills, body aches or malaise. 10/18/2016 -- she has  a significant cellulitis of her right lower extremity which was not there last week. I believe at this stage she will need the expertise of Dr. Ola Spurr to decide on antibiotic coverage. 10/25/2016 -- the patient has had cipro and ampicillin been started by Dr. Ola Spurr and he is awaiting further cultures. Overall she's been feeling better. 11/08/2016 -- Dr. Ola Spurr this week who has continued with ciprofloxacin and amoxicillin and is awaiting further cultures before deciding to place her on a PICC line. 11/15/2016 -- Dr. Blane Ohara note was reviewed and her cultures growing Pseudomonas in both legs which is sensitive to Cipro and the other is resistant to Cipro and she would get a PICC line and start IV antibiotics -- Ceftazidime 1 g q 12 for 3 weeks. 11-29-16 Mrs. Lish presents today in follow-up of her bilateral lower exterminators ulcers. She remains on IV antibiotic therapy via a PICC line per Dr. Ola Spurr; she has a follow-up appointment with him on Monday, 12/02/2016. She anticipates that the about therapy will be discontinued at that appointment. She denies any complications of nausea vomiting and/or diarrhea accompanied by this antibiotic therapy. She admits to using her lymphedema pumps twice daily with the exception of yesterday. She denies any concerns or complications with the current treatment plan. 12/06/2016 -- Dr. Ola Spurr saw her recently on 12/02/2016 and due to the fact she has had significant problems with recurrent ulceration and Pseudomonas infection he recommended 3 more weeks of antibiotics and extended it until December 25. The PICC line will be in place. Last hemoglobin A1c on November 30 was 9% 12/27/2016 -- because of the holidays the patient's diet has been higher in salt, her dressings have not been done as required and she is not used to lymphedema pumps appropriately. This has led to her lymphedema increasing markedly. 01/24/2017 -- the  patient's hospital bed is malfunctioning and hence her legs have been flat in bed and her lymphedema is increased markedly. During my examination I also noted a unstageable pressure injury to her right heel 02/07/2017 -- she has returned after 2 weeks as she was done with influenza and has been in bed a lot, with a new bed which has helped her reduce the edema due to proper elevation. Will she has a full-fledged large pressure injury to the right heel. 02/28/2017 -- hilar dressing was being taken down with scissors she had a sharp injury to the left medial calf area which is fairly superficial. KYLIANA, STANDEN. ([id number]) Objective Constitutional Pulse regular. Respirations normal and unlabored. Afebrile. Vitals Time Taken: 9:35 AM, Height: 67 in, Weight: 300 lbs, BMI: 47, Temperature: 98.1 F, Pulse: 82 bpm, Respiratory Rate: 18 breaths/min, Blood Pressure: 170/63 mmHg. Eyes Nonicteric. Reactive to light. Ears, Nose, Mouth, and Throat Lips, teeth, and gums WNL.Marland Kitchen Moist mucosa without lesions. Neck supple and nontender. No palpable supraclavicular or cervical adenopathy. Normal sized without  goiter. Respiratory WNL. No retractions.. Breath sounds WNL, No rubs, rales, rhonchi, or wheeze.. Cardiovascular Heart rhythm and rate regular, no murmur or gallop.. Pedal Pulses WNL. No clubbing, cyanosis or edema. Chest Breasts symmetical and no nipple discharge.. Breast tissue WNL, no masses, lumps, or tenderness.. Lymphatic No adneopathy. No adenopathy. No adenopathy. Musculoskeletal Adexa without tenderness or enlargement.. Digits and nails w/o clubbing, cyanosis, infection, petechiae, ischemia, or inflammatory conditions.Marland Kitchen Psychiatric Judgement and insight Intact.. No evidence of depression, anxiety, or agitation.. General Notes: there has been excellent resolution of her lymphedema and the wounds have minimal subcutaneous tenderness debris which are sharply removed with a #3 curet.  The new wound is fairly superficial and we will address this appropriately Integumentary (Hair, Skin) No suspicious lesions. No crepitus or fluctuance. No peri-wound warmth or erythema. No masses.. Wound #1 status is Open. Original cause of wound was Gradually Appeared. The wound is located on the Left,Posterior Lower Leg. The wound measures 2.4cm length x 0.4cm width x 0.1cm depth; 0.754cm^2 GLENDORA, CLOUATRE. ([id number]) area and 0.075cm^3 volume. The wound is limited to skin breakdown. There is no tunneling or undermining noted. There is a large amount of serous drainage noted. The wound margin is flat and intact. There is no granulation within the wound bed. There is a large (67-100%) amount of necrotic tissue within the wound bed including Adherent Slough. The periwound skin appearance exhibited: Excoriation, Maceration, Rubor. The periwound skin appearance did not exhibit: Callus, Crepitus, Induration, Rash, Scarring, Dry/Scaly, Atrophie Blanche, Cyanosis, Ecchymosis, Hemosiderin Staining, Mottled, Pallor, Erythema. Periwound temperature was noted as No Abnormality. The periwound has tenderness on palpation. Wound #2 status is Open. Original cause of wound was Gradually Appeared. The wound is located on the Right,Posterior Lower Leg. The wound measures 0.3cm length x 0.3cm width x 0.1cm depth; 0.071cm^2 area and 0.007cm^3 volume. The wound is limited to skin breakdown. There is no tunneling or undermining noted. There is a large amount of serous drainage noted. The wound margin is indistinct and nonvisible. There is medium (34-66%) red granulation within the wound bed. There is a medium (34-66%) amount of necrotic tissue within the wound bed including Adherent Slough. The periwound skin appearance exhibited: Excoriation, Maceration, Hemosiderin Staining, Rubor, Erythema. The periwound skin appearance did not exhibit: Callus, Crepitus, Induration, Rash, Scarring, Dry/Scaly, Atrophie  Blanche, Cyanosis, Ecchymosis, Mottled, Pallor. The surrounding wound skin color is noted with erythema which is circumferential. Periwound temperature was noted as No Abnormality. The periwound has tenderness on palpation. Wound #3 status is Open. Original cause of wound was Pressure Injury. The wound is located on the Right Calcaneus. The wound measures 0.5cm length x 0.8cm width x 0.1cm depth; 0.314cm^2 area and 0.031cm^3 volume. There is no tunneling or undermining noted. There is a large amount of serosanguineous drainage noted. The wound margin is distinct with the outline attached to the wound base. There is large (67-100%) red, pink granulation within the wound bed. There is a small (1-33%) amount of necrotic tissue within the wound bed including Adherent Slough. The periwound skin appearance exhibited: Maceration. Periwound temperature was noted as No Abnormality. The periwound has tenderness on palpation. Wound #4 status is Open. Original cause of wound was Trauma. The wound is located on the Left,Anterior Lower Leg. The wound measures 1.4cm length x 0.5cm width x 0.1cm depth; 0.55cm^2 area and 0.055cm^3 volume. The wound is limited to skin breakdown. There is no tunneling or undermining noted. There is a large amount of serosanguineous drainage noted. The wound margin  is distinct with the outline attached to the wound base. There is large (67-100%) red, pink granulation within the wound bed. There is no necrotic tissue within the wound bed. Periwound temperature was noted as No Abnormality. The periwound has tenderness on palpation. 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 ([id number]) Procedures Wound #1 Wound #1 is a Lymphedema located on the Left,Posterior Lower Leg . There was a Skin/Subcutaneous Tissue Debridement ([id number]) debridement with total area of 0.96 sq cm performed by Christin Fudge, MD. with the following instrument(s): Curette to remove Viable and Non-Viable tissue/material including Exudate, Fibrin/Slough, and Subcutaneous after achieving pain control using Lidocaine 4% Topical Solution. A time out was conducted at 09:13, prior to the start of the procedure. A Minimum amount of bleeding was controlled with Pressure. The procedure was tolerated well with a pain level of 0 throughout and a pain level of 0 following the procedure. Post Debridement Measurements: 2.4cm length x 0.4cm width x 0.1cm depth; 0.075cm^3 volume. Character of Wound/Ulcer Post Debridement requires further debridement. Severity of Tissue Post Debridement is: Fat layer exposed. Post procedure Diagnosis Wound #1: Same as Pre-Procedure Wound #2 Wound #2 is a Lymphedema located on the Right,Posterior Lower Leg . There was a Skin/Subcutaneous Tissue Debridement ([id number]) debridement with total area of 0.09 sq cm performed by Christin Fudge, MD. with the following instrument(s): Curette to remove Viable and Non-Viable tissue/material including Exudate, Fibrin/Slough, and Subcutaneous after achieving pain control using Lidocaine 4% Topical Solution. A time out was conducted at 09:13, prior to the start of the procedure. A Minimum amount of bleeding was controlled with Pressure. The procedure was tolerated well with a pain level of 0 throughout and a pain level of 0 following the procedure. Post Debridement Measurements: 0.3cm length x 0.3cm width x 0.1cm depth; 0.007cm^3 volume. Character of Wound/Ulcer Post Debridement requires further debridement. Severity of Tissue Post Debridement is: Fat layer exposed. Post  procedure Diagnosis Wound #2: Same as Pre-Procedure Wound #3 Wound #3 is a Pressure Ulcer located on the Right Calcaneus . There was a Skin/Subcutaneous Tissue Debridement ([id number]) debridement with total area of 0.4 sq cm performed by Christin Fudge, MD. with the following instrument(s): Curette to remove Viable and Non-Viable tissue/material including Exudate, Fibrin/Slough, and Subcutaneous after achieving pain control using Lidocaine 4% Topical Solution. A time out was conducted at 09:13, prior to the start of the procedure. A Minimum amount of bleeding was controlled with Pressure. The procedure was tolerated well with a pain level of 0 throughout and a pain level of 0 following the procedure. Post Debridement Measurements: 0.5cm length x 0.8cm width x 0.1cm depth; 0.031cm^3 volume. Post debridement Stage noted as Category/Stage II. Character of Wound/Ulcer Post Debridement requires further debridement. Severity of Tissue Post Debridement is: Fat layer exposed. Post procedure Diagnosis Wound #3: Same as Pre-Procedure HENRIETTE, HESSER ([id number]) Plan Wound Cleansing: Wound #1 Left,Posterior Lower Leg: Clean wound with Normal Saline. - clinic use Cleanse wound with mild soap and water - HHRN please scrub wounds with mild soap and water May Shower, gently pat wound dry prior to applying new dressing. May shower with protection. Wound #4 Left,Anterior Lower Leg: Clean  wound with Normal Saline. - clinic use Cleanse wound with mild soap and water - HHRN please scrub wounds with mild soap and water May Shower, gently pat wound dry prior to applying new dressing. May shower with protection. Wound #2 Right,Posterior Lower Leg: Clean wound with Normal Saline. - clinic use Cleanse wound with mild soap and water - HHRN please scrub wounds with mild soap and water May Shower, gently pat wound dry prior to applying new dressing. May shower with protection. Wound #3 Right  Calcaneus: Clean wound with Normal Saline. - clinic use Cleanse wound with mild soap and water - HHRN please scrub wounds with mild soap and water May Shower, gently pat wound dry prior to applying new dressing. May shower with protection. Anesthetic: Wound #1 Left,Posterior Lower Leg: Topical Lidocaine 4% cream applied to wound bed prior to debridement - clinic use Wound #4 Left,Anterior Lower Leg: Topical Lidocaine 4% cream applied to wound bed prior to debridement - clinic use Wound #2 Right,Posterior Lower Leg: Topical Lidocaine 4% cream applied to wound bed prior to debridement - clinic use Wound #3 Right Calcaneus: Topical Lidocaine 4% cream applied to wound bed prior to debridement - clinic use Primary Wound Dressing: Wound #1 Left,Posterior Lower Leg: Prisma Ag - moisten with saline Wound #4 Left,Anterior Lower Leg: Prisma Ag - moisten with saline Wound #2 Right,Posterior Lower Leg: Prisma Ag - moisten with saline Wound #3 Right Calcaneus: Prisma Ag - moisten with saline Secondary Dressing: Wound #1 Left,Posterior Lower Leg: ABD pad Dry Gauze Wound #4 Left,Anterior Lower Leg: ABD pad JAHNYLA, PARRILLO ([id number]) Dry Gauze Wound #2 Right,Posterior Lower Leg: ABD pad Dry Gauze Wound #3 Right Calcaneus: Dry Gauze Other - FOAM HEEL CUP Dressing Change Frequency: Wound #1 Left,Posterior Lower Leg: Change Dressing Monday, Wednesday, Friday - HHRN to change wraps Monday and Wednesday Wound #4 Left,Anterior Lower Leg: Change Dressing Monday, Wednesday, Friday - HHRN to change wraps Monday and Wednesday Wound #2 Right,Posterior Lower Leg: Change Dressing Monday, Wednesday, Friday - HHRN to change wraps Monday and Wednesday Change Dressing Monday, Wednesday, Friday - HHRN to change wraps Monday and Wednesday Follow-up Appointments: Wound #1 Left,Posterior Lower Leg: Return Appointment in 1 week. Wound #4 Left,Anterior Lower Leg: Return Appointment in 1 week. Wound  #2 Right,Posterior Lower Leg: Return Appointment in 1 week. Edema Control: Wound #1 Left,Posterior Lower Leg: 3 Layer Compression System - Bilateral - May anchor top of wrap with dome paste, but only wrap around leg one time. Elevate legs to the level of the heart and pump ankles as often as possible Compression Pump: Use compression pump on left lower extremity for 30 minutes, twice daily. Compression Pump: Use compression pump on right lower extremity for 30 minutes, twice daily. Wound #4 Left,Anterior Lower Leg: 3 Layer Compression System - Bilateral - May anchor top of wrap with dome paste, but only wrap around leg one time. Elevate legs to the level of the heart and pump ankles as often as possible Compression Pump: Use compression pump on left lower extremity for 30 minutes, twice daily. Compression Pump: Use compression pump on right lower extremity for 30 minutes, twice daily. Wound #2 Right,Posterior Lower Leg: 3 Layer Compression System - Bilateral - May anchor top of wrap with dome paste, but only wrap around leg one time. Elevate legs to the level of the heart and pump ankles as often as possible Compression Pump: Use compression pump on left lower extremity for 30 minutes, twice daily. Compression Pump: Use compression  pump on right lower extremity for 30 minutes, twice daily. Additional Orders / Instructions: Increase protein intake. Wound #2 Right,Posterior Lower Leg: Increase protein intake. Wound #4 Left,Anterior Lower Leg: Increase protein intake. Home Health: Wound #1 Left,Posterior Lower Leg: Continue Home Health Visits - [place] Nurse may visit PRN to address patient s wound care needs. MING, KUNKA. ([id number]) FACE TO FACE ENCOUNTER: MEDICARE and MEDICAID PATIENTS: I certify that this patient is under my care and that I had a face-to-face encounter that meets the physician face-to-face encounter requirements with this patient on this date.  The encounter with the patient was in whole or in part for the following MEDICAL CONDITION: (primary reason for [place]) MEDICAL NECESSITY: I certify, that based on my findings, NURSING services are a medically necessary home health service. HOME BOUND STATUS: I certify that my clinical findings support that this patient is homebound (i.e., Due to illness or injury, pt requires aid of supportive devices such as crutches, cane, wheelchairs, walkers, the use of special transportation or the assistance of another person to leave their place of residence. There is a normal inability to leave the home and doing so requires considerable and taxing effort. Other absences are for medical reasons / religious services and are infrequent or of short duration when for other reasons). Please direct any NON-WOUND related issues/requests for orders to patient's Primary Care Physician Wound #4 Left,Anterior Lower Leg: [place] Visits - [place] Nurse may visit PRN to address patient s wound care needs. FACE TO FACE ENCOUNTER: MEDICARE and MEDICAID PATIENTS: I certify that this patient is under my care and that I had a face-to-face encounter that meets the physician face-to-face encounter requirements with this patient on this date. The encounter with the patient was in whole or in part for the following MEDICAL CONDITION: (primary reason for [place]) MEDICAL NECESSITY: I certify, that based on my findings, NURSING services are a medically necessary home health service. HOME BOUND STATUS: I certify that my clinical findings support that this patient is homebound (i.e., Due to illness or injury, pt requires aid of supportive devices such as crutches, cane, wheelchairs, walkers, the use of special transportation or the assistance of another person to leave their place of residence. There is a normal inability to leave the home and doing so requires considerable and taxing  effort. Other absences are for medical reasons / religious services and are infrequent or of short duration when for other reasons). Please direct any NON-WOUND related issues/requests for orders to patient's Primary Care Physician Wound #2 Right,Posterior Lower Leg: [place] Visits - [place] Nurse may visit PRN to address patient s wound care needs. FACE TO FACE ENCOUNTER: MEDICARE and MEDICAID PATIENTS: I certify that this patient is under my care and that I had a face-to-face encounter that meets the physician face-to-face encounter requirements with this patient on this date. The encounter with the patient was in whole or in part for the following MEDICAL CONDITION: (primary reason for [place]) MEDICAL NECESSITY: I certify, that based on my findings, NURSING services are a medically necessary home health service. HOME BOUND STATUS: I certify that my clinical findings support that this patient is homebound (i.e., Due to illness or injury, pt requires aid of supportive devices such as crutches, cane, wheelchairs, walkers, the use of special transportation or the assistance of another person to leave their place of residence. There is a normal inability to leave the home  and doing so requires considerable and taxing effort. Other absences are for medical reasons / religious services and are infrequent or of short duration when for other reasons). Please direct any NON-WOUND related issues/requests for orders to patient's Primary Care Physician I have recommended: 1. silver collagen and drawtex and a 3 layer Profore to both lower extremities. 2. She will continue with elevation and exercise and her lymphedema pumps -- I had a long serious discussion with her regarding the importance of this POLLY, BARNER. ([id number]) 3. changing her dressings twice a week. 4. right heel will need silver collagen, be protected with a heel cup and again offloading techniques  have been discussed in great detail 5. I have had a detailed discussion with them, asked to be compliant with the appropriate dietary restriction, elevation and use of her lymphedema pumps. I have commended her on being very good with her diet and elevation of the leg Electronic Signature(s) Signed: 02/28/2017 3:42:17 PM By: Christin Fudge MD, FACS Previous Signature: 02/28/2017 9:21:39 AM Version By: Christin Fudge MD, FACS Entered By: Christin Fudge on 02/28/2017 15:42:16 Brooke Hopkins ([id number]) -------------------------------------------------------------------------------- SuperBill Details Patient Name: SHONDRIKA, HOQUE. Date of Service: 02/28/2017 Medical Record Number: [id number] Patient Account Number: [id number] Date of Birth/Sex: July 20, 1951 (66 y.o. 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 E66.01 Morbid (severe) obesity due to excess calories L89.612 Pressure ulcer of right heel, stage 2 Facility Procedures CPT4 Code: [id number] Description: 69485 - DEB SUBQ TISSUE 20 SQ CM/< ICD-10 Description Diagnosis E11.622 Type 2 diabetes mellitus with other skin ulcer I89.0 Lymphedema, not elsewhere classified L97.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-pressure chronic ulcer of left calf with fat l 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

## 2017-03-01 NOTE — Progress Notes (Signed)
ERINE, PHENIX ([id number]) Visit Report for 02/28/2017 Arrival Information Details Patient Name: Brooke Hopkins, Brooke Hopkins. Date of Service: 02/28/2017 8:45 AM Medical Record Number: [id number] Patient Account Number: [id number] Date of Birth/Sex: [birth year] (66 y.o. Female) Treating RN: Ahmed Prima Primary Care Breeze Berringer: FITZGERALD, DAVID Other Clinician: Referring Diannah Rindfleisch: FITZGERALD, DAVID Treating Yasmene Salomone/Extender: Frann Rider in Treatment: 46 Visit Information History Since Last Visit All ordered tests and consults were completed: No Patient Arrived: Wheel Chair Added or deleted any medications: No Arrival Time: 08:49 Any new allergies or adverse reactions: No Accompanied By: son Had a fall or experienced change in No Transfer Assistance: [employer] activities of daily living that may affect Patient Lift risk of falls: Patient Identification Verified: Yes Signs or symptoms of abuse/neglect since last No Secondary Verification Process Yes visito Completed: Hospitalized since last visit: No Patient Requires Transmission- No Has Dressing in Place as Prescribed: Yes Based Precautions: Has Compression in Place as Prescribed: Yes Patient Has Alerts: No Pain Present Now: No Electronic Signature(s) Signed: 02/28/2017 4:10:42 PM By: Alric Quan Entered By: Alric Quan on 02/28/2017 08:49:33 Brooke Hopkins ([id number]) -------------------------------------------------------------------------------- Encounter Discharge Information Details Patient Name: Brooke Hopkins, Brooke Hopkins. Date of Service: 02/28/2017 8:45 AM Medical Record Number: [id number] Patient Account Number: [id number] Date of Birth/Sex: 1951-07-17 (66 y.o. Female) Treating RN: Ahmed Prima Primary Care Cline Draheim: FITZGERALD, DAVID Other Clinician: Referring Daiana Vitiello: FITZGERALD, DAVID Treating Alfred Harrel/Extender: Frann Rider in Treatment: 42 Encounter Discharge Information Items Discharge  Pain Level: 0 Discharge Condition: Stable Ambulatory Status: Wheelchair Discharge Destination: Home Transportation: Private Auto Accompanied By: son Schedule Follow-up Appointment: Yes Medication Reconciliation completed No and provided to Patient/Care Aldahir Litaker: Provided on Clinical Summary of Care: 02/28/2017 Form Type Recipient Paper Patient LB Electronic Signature(s) Signed: 02/28/2017 4:10:42 PM By: Alric Quan Previous Signature: 02/28/2017 9:42:09 AM Version By: Sharon Mt Entered By: Alric Quan on 02/28/2017 09:43:30 Brooke Hopkins ([id number]) -------------------------------------------------------------------------------- Lower Extremity Assessment Details Patient Name: Brooke Hopkins, Brooke Hopkins. Date of Service: 02/28/2017 8:45 AM Medical Record Number: [id number] Patient Account Number: [id number] Date of Birth/Sex: 1951/09/05 (65 y.o. Female) Treating RN: Ahmed Prima Primary Care Tonja Jezewski: FITZGERALD, DAVID Other Clinician: Referring Ninah Moccio: FITZGERALD, DAVID Treating Jacquan Savas/Extender: Frann Rider in Treatment: 36 Edema Assessment Assessed: [Left: No] [Right: No] E[Left: dema] [Right: :] Calf Left: Right: Point of Measurement: 32 cm From Medial Instep cm cm Ankle Left: Right: Point of Measurement: 11 cm From Medial Instep cm cm Vascular Assessment Pulses: Dorsalis Pedis Palpable: [Left:Yes] [Right:Yes] Posterior Tibial Extremity colors, hair growth, and conditions: Extremity Color: [Left:Red] [Right:Red] Temperature of Extremity: [Left:Warm] [Right:Warm] Capillary Refill: [Left:< 3 seconds] [Right:< 3 seconds] Electronic Signature(s) Signed: 02/28/2017 4:10:42 PM By: Alric Quan Entered By: Alric Quan on 02/28/2017 09:09:20 Brooke Hopkins ([id number]) -------------------------------------------------------------------------------- Multi Wound Chart Details Patient Name: Brooke Hopkins. Date of Service: 02/28/2017 8:45  AM Medical Record Number: [id number] Patient Account Number: [id number] Date of Birth/Sex: 07-11-1951 (66 y.o. Female) Treating RN: Ahmed Prima Primary Care Lottie Sigman: FITZGERALD, DAVID Other Clinician: Referring Jeaneen Cala: FITZGERALD, DAVID Treating Chene Kasinger/Extender: Frann Rider in Treatment: 36 Vital Signs Height(in): 67 Pulse(bpm): 82 Weight(lbs): 300 Blood Pressure 170/63 (mmHg): Body Mass Index(BMI): 47 Temperature(F): 98.1 Respiratory Rate 18 (breaths/min): Photos: [1:No Photos] [2:No Photos] [3:No Photos] Wound Location: [1:Left Lower Leg - Posterior Right Lower Leg -] [2:Posterior] [3:Right Calcaneus] Wounding Event: [1:Gradually Appeared] [2:Gradually Appeared] [3:Pressure Injury] Primary Etiology: [1:Lymphedema] [2:Lymphedema] [3:Pressure Ulcer] Date Acquired: [1:12/31/2015] [2:12/31/2015] [3:01/24/2017] Weeks of Treatment: [1:36] [2:36] [3:3] Wound Status: [1:Open] [2:Open] [3:Open] Clustered Wound: [1:No] [2:Yes] [  3:No] Clustered Quantity: [1:N/A] [2:3] [3:N/A] Measurements L x W x D 2.4x0.4x0.1 [2:0.3x0.3x0.1] [3:0.5x0.8x0.1] (cm) Area (cm) : [1:0.754] [2:0.071] [3:0.314] Volume (cm) : [1:0.075] [2:0.007] [3:0.031] % Reduction in Area: [1:99.20%] [2:100.00%] [3:94.30%] % Reduction in Volume: 99.60% [2:100.00%] [3:97.20%] Classification: [1:Partial Thickness] [2:Partial Thickness] [3:Category/Stage II] Exudate Amount: [1:Large] [2:Large] [3:Large] Exudate Type: [1:Serous] [2:Serous] [3:Serosanguineous] Exudate Color: [1:amber] [2:amber] [3:red, brown] Foul Odor After [1:Yes] [2:Yes] [3:No] Cleansing: Odor Anticipated Due to No [2:No] [3:N/A] Product Use: Wound Margin: [1:Flat and Intact] [2:Indistinct, nonvisible] [3:Distinct, outline attached] Granulation Amount: [1:None Present (0%)] [2:Medium (34-66%)] [3:Large (67-100%)] Granulation Quality: [1:N/A] [2:Red] [3:Red, Pink] Necrotic Amount: [1:Large (67-100%)] [2:Medium (34-66%)] [3:Small  (1-33%)] Exposed Structures: Brooke Hopkins, Brooke Hopkins ([id number]) Fascia: No Fascia: No Fascia: No Fat Layer (Subcutaneous Fat Layer (Subcutaneous Fat Layer (Subcutaneous Tissue) Exposed: No Tissue) Exposed: No Tissue) Exposed: No Tendon: No Tendon: No Muscle: No Muscle: No Joint: No Joint: No Bone: No Bone: No Limited to Skin Limited to Skin Breakdown Breakdown Epithelialization: Medium (34-66%) Small (1-33%) None Debridement: Debridement (42706- Debridement (23762- Debridement ([id number] 11047) 11047) 11047) Pre-procedure 09:13 09:13 09:13 Verification/Time Out Taken: Pain Control: Lidocaine 4% Topical Lidocaine 4% Topical Lidocaine 4% Topical Solution Solution Solution Tissue Debrided: Fibrin/Slough, Exudates, Fibrin/Slough, Exudates, Fibrin/Slough, Exudates, Subcutaneous Subcutaneous Subcutaneous Level: Skin/Subcutaneous Skin/Subcutaneous Skin/Subcutaneous Tissue Tissue Tissue Debridement Area (sq 0.96 0.09 0.4 cm): Instrument: Curette Curette Curette Bleeding: Minimum Minimum Minimum Hemostasis Achieved: Pressure Pressure Pressure Procedural Pain: 0 0 0 Post Procedural Pain: 0 0 0 Debridement Treatment Procedure was tolerated Procedure was tolerated Procedure was tolerated Response: well well well Post Debridement 2.4x0.4x0.1 0.3x0.3x0.1 0.5x0.8x0.1 Measurements L x W x D (cm) Post Debridement 0.075 0.007 0.031 Volume: (cm) Post Debridement N/A N/A Category/Stage II Stage: Periwound Skin Texture: Excoriation: Yes Excoriation: Yes No Abnormalities Noted Induration: No Induration: No Callus: No Callus: No Crepitus: No Crepitus: No Rash: No Rash: No Scarring: No Scarring: No Periwound Skin Maceration: Yes Maceration: Yes Maceration: Yes Moisture: Dry/Scaly: No Dry/Scaly: No Periwound Skin Color: Rubor: Yes Erythema: Yes No Abnormalities Noted Atrophie Blanche: No Hemosiderin Staining: Yes Cyanosis: No Rubor: Yes Ecchymosis: No Atrophie Blanche:  No Erythema: No Cyanosis: No Brooke Hopkins, Brooke Hopkins ([id number]) Hemosiderin Staining: No Ecchymosis: No Mottled: No Mottled: No Pallor: No Pallor: No Erythema Location: N/A Circumferential N/A Temperature: No Abnormality No Abnormality No Abnormality Tenderness on Yes Yes Yes Palpation: Wound Preparation: Ulcer Cleansing: Other: Ulcer Cleansing: Other: Ulcer Cleansing: soap and water soap and water Rinsed/Irrigated with Saline Topical Anesthetic Topical Anesthetic Applied: Other: lidocaine Applied: Other: lidocaine Topical Anesthetic 4% 4% Applied: Other: LIDOCAINE 4% Procedures Performed: Debridement Debridement Debridement Wound Number: 4 N/A N/A Photos: No Photos N/A N/A Wound Location: Left Lower Leg - Anterior N/A N/A Wounding Event: Trauma N/A N/A Primary Etiology: Trauma, Other N/A N/A Date Acquired: 02/24/2017 N/A N/A Weeks of Treatment: 0 N/A N/A Wound Status: Open N/A N/A Clustered Wound: No N/A N/A Clustered Quantity: N/A N/A N/A Measurements L x W x D 1.4x0.5x0.1 N/A N/A (cm) Area (cm) : 0.55 N/A N/A Volume (cm) : 0.055 N/A N/A % Reduction in Area: N/A N/A N/A % Reduction in Volume: N/A N/A N/A Classification: Partial Thickness N/A N/A Exudate Amount: Large N/A N/A Exudate Type: Serosanguineous N/A N/A Exudate Color: red, brown N/A N/A Foul Odor After No N/A N/A Cleansing: Odor Anticipated Due to N/A N/A N/A Product Use: Wound Margin: Distinct, outline attached N/A N/A Granulation Amount: Large (67-100%) N/A N/A Granulation Quality: Red, Pink N/A N/A Necrotic Amount: None Present (0%) N/A  N/A Exposed Structures: Fascia: No N/A N/A Fat Layer (Subcutaneous Tissue) Exposed: No Tendon: No Muscle: No Brooke Hopkins, Brooke Hopkins. ([id number]) Joint: No Bone: No Limited to Skin Breakdown Epithelialization: None N/A N/A Debridement: N/A N/A N/A Pain Control: N/A N/A N/A Tissue Debrided: N/A N/A N/A Level: N/A N/A N/A Debridement Area (sq N/A N/A  N/A cm): Instrument: N/A N/A N/A Bleeding: N/A N/A N/A Hemostasis Achieved: N/A N/A N/A Procedural Pain: N/A N/A N/A Post Procedural Pain: N/A N/A N/A Debridement Treatment N/A N/A N/A Response: Post Debridement N/A N/A N/A Measurements L x W x D (cm) Post Debridement N/A N/A N/A Volume: (cm) Post Debridement N/A N/A N/A Stage: Periwound Skin Texture: No Abnormalities Noted N/A N/A Periwound Skin No Abnormalities Noted N/A N/A Moisture: Periwound Skin Color: No Abnormalities Noted N/A N/A Erythema Location: N/A N/A N/A Temperature: No Abnormality N/A N/A Tenderness on Yes N/A N/A Palpation: Wound Preparation: Ulcer Cleansing: N/A N/A Rinsed/Irrigated with Saline, Other: soap and water Topical Anesthetic Applied: Other: lidocaine 4% Procedures Performed: N/A N/A N/A Treatment Notes Wound #1 (Left, Posterior Lower Leg) 1. Cleansed with: Clean wound with Normal Saline Cleanse wound with antibacterial soap and water 2. Anesthetic Brooke Hopkins, Brooke Hopkins ([id number]) Topical Lidocaine 4% cream to wound bed prior to debridement 4. Dressing Applied: Prisma Ag 5. Secondary Dressing Applied ABD Pad Dry Gauze 7. Secured with 3 Layer Compression System - Bilateral Wound #2 (Right, Posterior Lower Leg) 1. Cleansed with: Clean wound with Normal Saline Cleanse wound with antibacterial soap and water 2. Anesthetic Topical Lidocaine 4% cream to wound bed prior to debridement 4. Dressing Applied: Prisma Ag 5. Secondary Dressing Applied ABD Pad Dry Gauze 7. Secured with 3 Layer Compression System - Bilateral Wound #3 (Right Calcaneus) 1. Cleansed with: Clean wound with Normal Saline Cleanse wound with antibacterial soap and water 2. Anesthetic Topical Lidocaine 4% cream to wound bed prior to debridement 4. Dressing Applied: Prisma Ag 5. Secondary Dressing Applied Dry Gauze Notes foam heel cup Wound #4 (Left, Anterior Lower Leg) 1. Cleansed with: Clean wound with  Normal Saline Cleanse wound with antibacterial soap and water 2. Anesthetic Topical Lidocaine 4% cream to wound bed prior to debridement 4. Dressing Applied: Prisma Ag 5. Secondary Dressing Applied ABD Pad Brooke Hopkins, Brooke Hopkins. ([id number]) Dry Gauze 7. Secured with 3 Layer Compression System - Bilateral Electronic Signature(s) Signed: 02/28/2017 4:10:42 PM By: Alric Quan Previous Signature: 02/28/2017 9:19:17 AM Version By: Christin Fudge MD, FACS Entered By: Alric Quan on 02/28/2017 09:42:12 Brooke Hopkins ([id number]) -------------------------------------------------------------------------------- [place] Details Patient Name: Brooke Hopkins, Brooke Hopkins. Date of Service: 02/28/2017 8:45 AM Medical Record Number: [id number] Patient Account Number: [id number] Date of Birth/Sex: 09/19/1951 (66 y.o. Female) Treating RN: Ahmed Prima Primary Care [place] Baldwin: FITZGERALD, DAVID Other Clinician: Referring Ophelia Sipe: FITZGERALD, DAVID Treating Court Gracia/Extender: Frann Rider in Treatment: 70 Active Inactive ` Orientation to the Wound Care Program Nursing Diagnoses: Knowledge deficit related to the wound healing center program Goals: Patient/caregiver will verbalize understanding of the [place] Date Initiated: 06/21/2016 Target Resolution Date: 03/29/2017 Goal Status: Active Interventions: Provide education on orientation to the wound center Notes: ` Wound/Skin Impairment Nursing Diagnoses: Impaired tissue integrity Goals: Patient/caregiver will verbalize understanding of skin care regimen Date Initiated: 06/21/2016 Target Resolution Date: 03/29/2017 Goal Status: Active Ulcer/skin breakdown will have a volume reduction of 30% by week 4 Date Initiated: 06/21/2016 Target Resolution Date: 03/29/2017 Goal Status: Active Ulcer/skin breakdown will have a volume reduction of 50% by week 8 Date Initiated: 06/21/2016  Target Resolution  Date: 03/29/2017 Goal Status: Active Ulcer/skin breakdown will have a volume reduction of 80% by week 12 Date Initiated: 06/21/2016 Target Resolution Date: 03/29/2017 Goal Status: Active Ulcer/skin breakdown will heal within 14 weeks Date Initiated: 06/21/2016 Target Resolution Date: 03/29/2017 Brooke Hopkins, Brooke Hopkins ([id number]) Goal Status: Active Interventions: Assess patient/caregiver ability to obtain necessary supplies Assess patient/caregiver ability to perform ulcer/skin care regimen upon admission and as needed Assess ulceration(s) every visit Provide education on ulcer and skin care Notes: Electronic Signature(s) Signed: 02/28/2017 4:10:42 PM By: Alric Quan Entered By: Alric Quan on 02/28/2017 09:41:49 Brooke Hopkins ([id number]) -------------------------------------------------------------------------------- Pain Assessment Details Patient Name: Brooke Hopkins, Brooke Hopkins. Date of Service: 02/28/2017 8:45 AM Medical Record Number: [id number] Patient Account Number: [id number] Date of Birth/Sex: [birth year] (66 y.o. Female) Treating RN: Ahmed Prima Primary Care Maybelline Kolarik: FITZGERALD, DAVID Other Clinician: Referring Damilola Flamm: FITZGERALD, DAVID Treating Yuriy Cui/Extender: Frann Rider in Treatment: 21 Active Problems Location of Pain Severity and Description of Pain Patient Has Paino No Site Locations With Dressing Change: No Pain Management and Medication Current Pain Management: Electronic Signature(s) Signed: 02/28/2017 4:10:42 PM By: Alric Quan Entered By: Alric Quan on 02/28/2017 08:49:41 Brooke Hopkins ([id number]) -------------------------------------------------------------------------------- Patient/Caregiver Education Details Patient Name: LANDA, MULLINAX. Date of Service: 02/28/2017 8:45 AM Medical Record Number: [id number] Patient Account Number: [id number] Date of Birth/Gender: 1951-01-24 (66 y.o. Female) Treating RN: Ahmed Prima Primary Care Physician: FITZGERALD, DAVID Other Clinician: Referring Physician: FITZGERALD, DAVID Treating Physician/Extender: Frann Rider in Treatment: 32 Education Assessment Education Provided To: Patient Education Topics Provided Wound/Skin Impairment: Handouts: Other: change dressings as directed and do not get wraps wet Methods: Demonstration, Explain/Verbal Responses: State content correctly Electronic Signature(s) Signed: 02/28/2017 4:10:42 PM By: Alric Quan Entered By: Alric Quan on 02/28/2017 09:43:53 Tiu, Mylena M. ([id number]) -------------------------------------------------------------------------------- Wound Assessment Details Patient Name: SARINITY, DICICCO. Date of Service: 02/28/2017 8:45 AM Medical Record Number: [id number] Patient Account Number: [id number] Date of Birth/Sex: 10-10-1951 (66 y.o. Female) Treating RN: Ahmed Prima Primary Care Amberlynn Tempesta: FITZGERALD, DAVID Other Clinician: Referring Michelle Vanhise: FITZGERALD, DAVID Treating Deboraha Goar/Extender: Frann Rider in Treatment: 36 Wound Status Wound Number: 1 Primary Etiology: Lymphedema Wound Location: Left Lower Leg - Posterior Wound Status: Open Wounding Event: Gradually Appeared Date Acquired: 12/31/2015 Weeks Of Treatment: 36 Clustered Wound: No Photos Photo Uploaded By: Alric Quan on 02/28/2017 14:07:07 Wound Measurements Length: (cm) 2.4 Width: (cm) 0.4 Depth: (cm) 0.1 Area: (cm) 0.754 Volume: (cm) 0.075 % Reduction in Area: 99.2% % Reduction in Volume: 99.6% Epithelialization: Medium (34-66%) Tunneling: No Undermining: No Wound Description Classification: Partial Thickness Wound Margin: Flat and Intact Exudate Amount: Large Exudate Type: Serous Exudate Color: amber Foul Odor After Cleansing: Yes Due to Product Use: No Slough/Fibrino Yes Wound Bed Granulation Amount: None Present (0%) Exposed Structure Necrotic Amount: Large  (67-100%) Fascia Exposed: No Necrotic Quality: Adherent Slough Fat Layer (Subcutaneous Tissue) Exposed: No Tendon Exposed: No EILA, RUNYAN ([id number]) Muscle Exposed: No Joint Exposed: No Bone Exposed: No Limited to Skin Breakdown Periwound Skin Texture Texture Color No Abnormalities Noted: No No Abnormalities Noted: No Callus: No Atrophie Blanche: No Crepitus: No Cyanosis: No Excoriation: Yes Ecchymosis: No Induration: No Erythema: No Rash: No Hemosiderin Staining: No Scarring: No Mottled: No Pallor: No Moisture Rubor: Yes No Abnormalities Noted: No Dry / Scaly: No Temperature / Pain Maceration: Yes Temperature: No Abnormality Tenderness on Palpation: Yes Wound Preparation Ulcer Cleansing: Other: soap and water, Topical Anesthetic Applied: Other: lidocaine 4%, Treatment Notes Wound #1 (Left, Posterior Lower Leg)  1. Cleansed with: Clean wound with Normal Saline Cleanse wound with antibacterial soap and water 2. Anesthetic Topical Lidocaine 4% cream to wound bed prior to debridement 4. Dressing Applied: Prisma Ag 5. Secondary Dressing Applied ABD Pad Dry Gauze 7. Secured with 3 Layer Compression System - Bilateral Electronic Signature(s) Signed: 02/28/2017 4:10:42 PM By: Alric Quan Entered By: Alric Quan on 02/28/2017 09:06:15 Brooke Hopkins ([id number]) -------------------------------------------------------------------------------- Wound Assessment Details Patient Name: HARLYNN, KIMBELL. Date of Service: 02/28/2017 8:45 AM Medical Record Number: [id number] Patient Account Number: [id number] Date of Birth/Sex: 1951-10-14 (66 y.o. Female) Treating RN: Ahmed Prima Primary Care Nyeema Want: FITZGERALD, DAVID Other Clinician: Referring Audriella Blakeley: FITZGERALD, DAVID Treating Kwame Ryland/Extender: Frann Rider in Treatment: 36 Wound Status Wound Number: 2 Primary Etiology: Lymphedema Wound Location: Right Lower Leg -  Posterior Wound Status: Open Wounding Event: Gradually Appeared Date Acquired: 12/31/2015 Weeks Of Treatment: 36 Clustered Wound: Yes Photos Photo Uploaded By: Alric Quan on 02/28/2017 14:07:29 Wound Measurements Length: (cm) 0.3 Width: (cm) 0.3 Depth: (cm) 0.1 Clustered Quantity: 3 Area: (cm) 0.071 Volume: (cm) 0.007 % Reduction in Area: 100% % Reduction in Volume: 100% Epithelialization: Small (1-33%) Tunneling: No Undermining: No Wound Description Classification: Partial Thickness Wound Margin: Indistinct, nonvisible Exudate Amount: Large Exudate Type: Serous Exudate Color: amber Foul Odor After Cleansing: Yes Due to Product Use: No Slough/Fibrino Yes Wound Bed Granulation Amount: Medium (34-66%) Exposed Structure Granulation Quality: Red Fascia Exposed: No Necrotic Amount: Medium (34-66%) Fat Layer (Subcutaneous Tissue) Exposed: No RUMOR, SUN ([id number]) Necrotic Quality: Adherent Slough Tendon Exposed: No Muscle Exposed: No Joint Exposed: No Bone Exposed: No Limited to Skin Breakdown Periwound Skin Texture Texture Color No Abnormalities Noted: No No Abnormalities Noted: No Callus: No Atrophie Blanche: No Crepitus: No Cyanosis: No Excoriation: Yes Ecchymosis: No Induration: No Erythema: Yes Rash: No Erythema Location: Circumferential Scarring: No Hemosiderin Staining: Yes Mottled: No Moisture Pallor: No No Abnormalities Noted: No Rubor: Yes Dry / Scaly: No Maceration: Yes Temperature / Pain Temperature: No Abnormality Tenderness on Palpation: Yes Wound Preparation Ulcer Cleansing: Other: soap and water, Topical Anesthetic Applied: Other: lidocaine 4%, Treatment Notes Wound #2 (Right, Posterior Lower Leg) 1. Cleansed with: Clean wound with Normal Saline Cleanse wound with antibacterial soap and water 2. Anesthetic Topical Lidocaine 4% cream to wound bed prior to debridement 4. Dressing Applied: Prisma Ag 5. Secondary  Dressing Applied ABD Pad Dry Gauze 7. Secured with 3 Layer Compression System - Bilateral Electronic Signature(s) Signed: 02/28/2017 4:10:42 PM By: Alric Quan Entered By: Alric Quan on 02/28/2017 09:05:34 Brooke Hopkins ([id number]) -------------------------------------------------------------------------------- Wound Assessment Details Patient Name: RYLIEE, FIGGE. Date of Service: 02/28/2017 8:45 AM Medical Record Number: [id number] Patient Account Number: [id number] Date of Birth/Sex: 1951/03/15 (66 y.o. Female) Treating RN: Ahmed Prima Primary Care Prudy Candy: FITZGERALD, DAVID Other Clinician: Referring Oniel Meleski: FITZGERALD, DAVID Treating Alyce Inscore/Extender: Frann Rider in Treatment: 36 Wound Status Wound Number: 3 Primary Etiology: Pressure Ulcer Wound Location: Right Calcaneus Wound Status: Open Wounding Event: Pressure Injury Date Acquired: 01/24/2017 Weeks Of Treatment: 3 Clustered Wound: No Photos Photo Uploaded By: Alric Quan on 02/28/2017 14:07:43 Wound Measurements Length: (cm) 0.5 Width: (cm) 0.8 Depth: (cm) 0.1 Area: (cm) 0.314 Volume: (cm) 0.031 % Reduction in Area: 94.3% % Reduction in Volume: 97.2% Epithelialization: None Tunneling: No Undermining: No Wound Description Classification: Category/Stage II Foul Odor Aft Wound Margin: Distinct, outline attached Slough/Fibrin Exudate Amount: Large Exudate Type: Serosanguineous Exudate Color: red, brown er Cleansing: No o No Wound Bed Granulation Amount: Large (67-100%) Exposed Structure  Granulation Quality: Red, Pink Fascia Exposed: No Necrotic Amount: Small (1-33%) Fat Layer (Subcutaneous Tissue) Exposed: No Necrotic Quality: [address], Maevyn M. ([id number]) Periwound Skin Texture Texture Color No Abnormalities Noted: No No Abnormalities Noted: No Moisture Temperature / Pain No Abnormalities Noted: No Temperature: No Abnormality Maceration:  Yes Tenderness on Palpation: Yes Wound Preparation Ulcer Cleansing: Rinsed/Irrigated with Saline Topical Anesthetic Applied: Other: LIDOCAINE 4%, Treatment Notes Wound #3 (Right Calcaneus) 1. Cleansed with: Clean wound with Normal Saline Cleanse wound with antibacterial soap and water 2. Anesthetic Topical Lidocaine 4% cream to wound bed prior to debridement 4. Dressing Applied: Prisma Ag 5. Secondary Dressing Applied Dry Gauze Notes foam heel cup Electronic Signature(s) Signed: 02/28/2017 4:10:42 PM By: Alric Quan Entered By: Alric Quan on 02/28/2017 09:05:01 Brooke Hopkins ([id number]) -------------------------------------------------------------------------------- Wound Assessment Details Patient Name: JURNI, CESARO. Date of Service: 02/28/2017 8:45 AM Medical Record Number: [id number] Patient Account Number: [id number] Date of Birth/Sex: August 03, 1951 (66 y.o. Female) Treating RN: Ahmed Prima Primary Care Joory Gough: FITZGERALD, DAVID Other Clinician: Referring Suleyman Ehrman: FITZGERALD, DAVID Treating Bula Cavalieri/Extender: Frann Rider in Treatment: 36 Wound Status Wound Number: 4 Primary Etiology: Trauma, Other Wound Location: Left Lower Leg - Anterior Wound Status: Open Wounding Event: Trauma Date Acquired: 02/24/2017 Weeks Of Treatment: 0 Clustered Wound: No Photos Photo Uploaded By: Alric Quan on 02/28/2017 14:07:55 Wound Measurements Length: (cm) 1.4 Width: (cm) 0.5 Depth: (cm) 0.1 Area: (cm) 0.55 Volume: (cm) 0.055 % Reduction in Area: % Reduction in Volume: Epithelialization: None Tunneling: No Undermining: No Wound Description Classification: Partial Thickness Foul Odor Aft Wound Margin: Distinct, outline attached Slough/Fibrin Exudate Amount: Large Exudate Type: Serosanguineous Exudate Color: red, brown er Cleansing: No o No Wound Bed Granulation Amount: Large (67-100%) Exposed Structure Granulation Quality: Red,  Pink Fascia Exposed: No Necrotic Amount: None Present (0%) Fat Layer (Subcutaneous Tissue) Exposed: No Tendon Exposed: No DALISHA, SHIVELY ([id number]) Muscle Exposed: No Joint Exposed: No Bone Exposed: No Limited to Skin Breakdown Periwound Skin Texture Texture Color No Abnormalities Noted: No No Abnormalities Noted: No Moisture Temperature / Pain No Abnormalities Noted: No Temperature: No Abnormality Tenderness on Palpation: Yes Wound Preparation Ulcer Cleansing: Rinsed/Irrigated with Saline, Other: soap and water, Topical Anesthetic Applied: Other: lidocaine 4%, Treatment Notes Wound #4 (Left, Anterior Lower Leg) 1. Cleansed with: Clean wound with Normal Saline Cleanse wound with antibacterial soap and water 2. Anesthetic Topical Lidocaine 4% cream to wound bed prior to debridement 4. Dressing Applied: Prisma Ag 5. Secondary Dressing Applied ABD Pad Dry Gauze 7. Secured with 3 Layer Compression System - Bilateral Electronic Signature(s) Signed: 02/28/2017 4:10:42 PM By: Alric Quan Entered By: Alric Quan on 02/28/2017 09:04:15 Brooke Hopkins ([id number]) -------------------------------------------------------------------------------- [place] [address] [place] Details Patient Name: LEEANN, BADY. Date of Service: 02/28/2017 8:45 AM Medical Record Number: [id number] Patient Account Number: [id number] Date of Birth/Sex: 1951/01/13 (66 y.o. Female) Treating RN: Ahmed Prima Primary Care Benny Henrie: FITZGERALD, DAVID Other Clinician: Referring Cordella Nyquist: FITZGERALD, DAVID Treating Asiah Befort/Extender: Frann Rider in Treatment: 66 Vital Signs Time Taken: 09:35 Temperature (F): 98.1 Height (in): 67 Pulse (bpm): 82 Weight (lbs): 300 Respiratory Rate (breaths/min): 18 Body Mass Index (BMI): 47 Blood Pressure (mmHg): 170/63 Reference Range: 80 - 120 mg / dl Electronic Signature(s) Signed: 02/28/2017 4:10:42 PM By: Alric Quan Entered By: Alric Quan on 02/28/2017 09:35:54

## 2017-03-07 ENCOUNTER — Encounter: Payer: 59 | Admitting: Surgery

## 2017-03-07 DIAGNOSIS — I890 Lymphedema, not elsewhere classified: Secondary | ICD-10-CM | POA: Diagnosis not present

## 2017-03-07 DIAGNOSIS — E11622 Type 2 diabetes mellitus with other skin ulcer: Secondary | ICD-10-CM | POA: Diagnosis not present

## 2017-03-07 DIAGNOSIS — L97212 Non-pressure chronic ulcer of right calf with fat layer exposed: Secondary | ICD-10-CM | POA: Diagnosis not present

## 2017-03-07 DIAGNOSIS — L89612 Pressure ulcer of right heel, stage 2: Secondary | ICD-10-CM | POA: Diagnosis not present

## 2017-03-07 DIAGNOSIS — L97222 Non-pressure chronic ulcer of left calf with fat layer exposed: Secondary | ICD-10-CM | POA: Diagnosis not present

## 2017-03-07 DIAGNOSIS — Z87891 Personal history of nicotine dependence: Secondary | ICD-10-CM | POA: Diagnosis not present

## 2017-03-08 NOTE — Progress Notes (Signed)
Brooke Hopkins ([id number]) Visit Report for 03/07/2017 Chief Complaint Document Details Patient Name: Brooke Hopkins, Brooke Hopkins. Date of Service: 03/07/2017 8:45 AM Medical Record Number: [id number] Patient Account Number: [id number] Date of Birth/Sex: [birth year] (66 y.o. Female) Treating RN: Ahmed Prima Primary Care Provider: FITZGERALD, DAVID Other Clinician: Referring Provider: FITZGERALD, DAVID Treating Provider/Extender: Frann Rider in Treatment: 67 Information Obtained from: Patient Chief Complaint Mrs. Deltoro returns for follow-up to her bilateral lower extremity ulcers Electronic Signature(s) Signed: 03/07/2017 9:07:01 AM By: Christin Fudge MD, FACS Entered By: Christin Fudge on 03/07/2017 09:07:01 [name] ([id number]) -------------------------------------------------------------------------------- Debridement Details Patient Name: Brooke Hopkins. Date of Service: 03/07/2017 8:45 AM Medical Record Number: [id number] Patient Account Number: [id number] Date of Birth/Sex: 14-May-1951 (66 y.o. Female) Treating RN: Ahmed Prima Primary Care Provider: FITZGERALD, DAVID Other Clinician: Referring Provider: FITZGERALD, DAVID Treating Provider/Extender: Frann Rider in Treatment: 37 Debridement Performed for Wound #3 Right Calcaneus Assessment: Performed By: Physician Christin Fudge, MD Debridement: Debridement Pre-procedure Yes - 09:00 Verification/Time Out Taken: Start Time: 09:01 Pain Control: Lidocaine 4% Topical Solution Level: Skin/Subcutaneous Tissue Total Area Debrided (L x 0.5 (cm) x 0.3 (cm) = 0.15 (cm) W): Tissue and other Viable, Non-Viable, Exudate, Fibrin/Slough, Subcutaneous material debrided: Instrument: Forceps Bleeding: Minimum Hemostasis Achieved: Pressure End Time: 09:03 Procedural Pain: 0 Post Procedural Pain: 0 Response to Treatment: Procedure was tolerated well Post Debridement Measurements of Total Wound Length: (cm)  0.5 Stage: Category/Stage II Width: (cm) 0.3 Depth: (cm) 0.1 Volume: (cm) 0.012 Character of Wound/Ulcer Post Requires Further Debridement: Debridement Severity of Tissue Post Fat layer exposed Debridement: Post Procedure Diagnosis Same as Pre-procedure Electronic Signature(s) Signed: 03/07/2017 9:06:54 AM By: Christin Fudge MD, FACS Signed: 03/07/2017 5:07:14 PM By: Alric Quan Entered By: Christin Fudge on 03/07/2017 09:06:54 [name] ([id number]) [name], [name] ([id number]) -------------------------------------------------------------------------------- HPI Details Patient Name: Brooke Hopkins. Date of Service: 03/07/2017 8:45 AM Medical Record Number: [id number] Patient Account Number: [id number] Date of Birth/Sex: 1951-03-16 (66 y.o. Female) Treating RN: Ahmed Prima Primary Care Provider: FITZGERALD, DAVID Other Clinician: Referring Provider: FITZGERALD, DAVID Treating Provider/Extender: Frann Rider in Treatment: 37 History of Present Illness Location: massive swelling of both lower extremities and ulceration both lower extremities Quality: Patient reports experiencing a dull pain to affected area(s). Severity: Patient states wound are getting worse. Duration: Patient has had the wound for >2 years prior to seeking treatment at the wound center Timing: Pain in wound is constant (hurts all the time) Context: The wound appeared gradually over time Modifying Factors: Other treatment(s) tried include:she has a lymphedema pump but uses it seldom and she's had several course of antibiotics Associated Signs and Symptoms: Patient reports having difficulty standing for long periods. HPI Description: 66 year old patient seen by Dr. Ola Spurr of infectious disease who has been following up for left lower extremity cellulitis and ulcer with recurrent bilateral lower extremity problems for several months. Recently she had a large right lower extremity  bullae which opened out and has been ulcerated. She has seen the vascular group and has been getting Unna's wraps and has a lymphedema pump used in the past. Her prior cultures were positive for Pseudomonas, Proteus and was treated with amoxicillin. He has also been treated with 2 weeks course of ciprofloxacin and amoxicillin.. Increase of Lasix dose helped with the edema and echo showed no systolic CHF but may be diastolic problems. Past medical history significant for diabetes mellitus type 2, venous stasis ulcer, obesity, diabetic peripheral neuropathy, status post back surgery, cholecystectomy,  hysterectomy, arthroscopy of the knee. He is a former smoker and quit smoking in 1984. The patient has seen Dr. Delana Meyer who did not recommend any arterial or venous duplex studies and has been using Unna's wraps and also recommended a lymphedema pump. she has been very noncompliant with using these. 06/28/2016 -- the patient is still on antibiotics as prescribed by Dr. Ola Spurr and he is asked her to take it for 3 weeks. The patient also says she has a lot of redness and pain in the folds of her thigh and lower extremity and this is very painful. 07/26/2016 -- the patient is off antibiotics and has been getting dressing changes 3 times a week. 08/09/2016 -- is been on Cipro and is taking potassium supplements along with her Lasix and is going to be seeing Dr. Ola Spurr for a consult return visit only in November 2017. 08/23/2016 -- she was admitted to the hospital last week and I have reviewed these reports in detail. She was seen by Dr. Ola Spurr who noted a Pseudomonas infection which was resistant to ciprofloxacin and discharged her on Ceftazidime 1 g IV every 12 hourly anyone days. The antibiotic was to be stopped on September 11 and he would see her back in the clinic. 08/30/2016 -- had a communication from Dr. Ola Spurr that he would extend her antibiotics by a week if she continued to  look like cellulitis was persisting. AME, HEAGLE ([id number]) 09/13/2016 -- the PICC line is out and antibiotics have stopped. 10/04/16: returns today for f/u. reports utilizes compression pump twice daily. she is compliant with her compression therapy. she reports a new blister on the right proximal posterior calf. no systemic s/s of infection. 10/11/16: returns today for f/u. she reports adherence to her compression pumps, but there is no improvement regarding her BLE edema. there is weeping of fluid. denies fever, chills, body aches or malaise. 10/18/2016 -- she has a significant cellulitis of her right lower extremity which was not there last week. I believe at this stage she will need the expertise of Dr. Ola Spurr to decide on antibiotic coverage. 10/25/2016 -- the patient has had cipro and ampicillin been started by Dr. Ola Spurr and he is awaiting further cultures. Overall she's been feeling better. 11/08/2016 -- Dr. Ola Spurr this week who has continued with ciprofloxacin and amoxicillin and is awaiting further cultures before deciding to place her on a PICC line. 11/15/2016 -- Dr. Blane Ohara note was reviewed and her cultures growing Pseudomonas in both legs which is sensitive to Cipro and the other is resistant to Cipro and she would get a PICC line and start IV antibiotics -- Ceftazidime 1 g q 12 for 3 weeks. 11-29-16 Mrs. Fidalgo presents today in follow-up of her bilateral lower exterminators ulcers. She remains on IV antibiotic therapy via a PICC line per Dr. Ola Spurr; she has a follow-up appointment with him on Monday, 12/02/2016. She anticipates that the about therapy will be discontinued at that appointment. She denies any complications of nausea vomiting and/or diarrhea accompanied by this antibiotic therapy. She admits to using her lymphedema pumps twice daily with the exception of yesterday. She denies any concerns or complications with the current treatment  plan. 12/06/2016 -- Dr. Ola Spurr saw her recently on 12/02/2016 and due to the fact she has had significant problems with recurrent ulceration and Pseudomonas infection he recommended 3 more weeks of antibiotics and extended it until December 25. The PICC line will be in place. Last hemoglobin A1c on November 30 was 9%  12/27/2016 -- because of the holidays the patient's diet has been higher in salt, her dressings have not been done as required and she is not used to lymphedema pumps appropriately. This has led to her lymphedema increasing markedly. 01/24/2017 -- the patient's hospital bed is malfunctioning and hence her legs have been flat in bed and her lymphedema is increased markedly. During my examination I also noted a unstageable pressure injury to her right heel 02/07/2017 -- she has returned after 2 weeks as she was done with influenza and has been in bed a lot, with a new bed which has helped her reduce the edema due to proper elevation. Will she has a full-fledged large pressure injury to the right heel. 02/28/2017 -- hilar dressing was being taken down with scissors she had a sharp injury to the left medial calf area which is fairly superficial. Electronic Signature(s) Signed: 03/07/2017 9:07:15 AM By: Christin Fudge MD, FACS Entered By: Christin Fudge on 03/07/2017 09:07:15 [name] ([id number]) -------------------------------------------------------------------------------- Physical Exam Details Patient Name: ZEOLA, BRYS. Date of Service: 03/07/2017 8:45 AM Medical Record Number: [id number] Patient Account Number: [id number] Date of Birth/Sex: March 08, 1951 (66 y.o. Female) Treating RN: Ahmed Prima Primary Care Provider: FITZGERALD, DAVID Other Clinician: Referring Provider: FITZGERALD, DAVID Treating Provider/Extender: Christin Fudge Weeks in Treatment: 37 Constitutional . Pulse regular. Respirations normal and unlabored. Afebrile. . Eyes Nonicteric.  Reactive to light. Ears, Nose, Mouth, and Throat Lips, teeth, and gums WNL.Marland Kitchen Moist mucosa without lesions. Neck supple and nontender. No palpable supraclavicular or cervical adenopathy. Normal sized without goiter. Respiratory WNL. No retractions.. Breath sounds WNL, No rubs, rales, rhonchi, or wheeze.. Cardiovascular Heart rhythm and rate regular, no murmur or gallop.. Pedal Pulses WNL. No clubbing, cyanosis or edema. Chest Breasts symmetical and no nipple discharge.. Breast tissue WNL, no masses, lumps, or tenderness.. Gastrointestinal (GI) Abdomen without masses or tenderness.. No liver or spleen enlargement or tenderness.. Genitourinary (GU) No hydrocele, spermatocele, tenderness of the cord, or testicular mass.Marland Kitchen Penis without lesions.Lowella Fairy without lesions. No cystocele, or rectocele. Pelvic support intact, no discharge.Marland Kitchen Urethra without masses, tenderness or scarring.Marland Kitchen Lymphatic No adneopathy. No adenopathy. No adenopathy. Musculoskeletal Adexa without tenderness or enlargement.. Digits and nails w/o clubbing, cyanosis, infection, petechiae, ischemia, or inflammatory conditions.. Integumentary (Hair, Skin) No suspicious lesions. No crepitus or fluctuance. No peri-wound warmth or erythema. No masses.Marland Kitchen Psychiatric Judgement and insight Intact.. No evidence of depression, anxiety, or agitation.. Notes the lymphedema has gone down significantly and all the wounds are looking much cleaner. The right heel was sharply debrided and once all the eschar was removed there is healthy granulation tissue under this Electronic Signature(s) LATIKA, KRONICK ([id number]) Signed: 03/07/2017 9:07:41 AM By: Christin Fudge MD, FACS Entered By: Christin Fudge on 03/07/2017 09:07:41 [name] ([id number]) -------------------------------------------------------------------------------- Physician Orders Details Patient Name: BIANCO, CANGE. Date of Service: 03/07/2017 8:45 AM Medical  Record Number: [id number] Patient Account Number: [id number] Date of Birth/Sex: Mar 22, 1951 (66 y.o. Female) Treating RN: Ahmed Prima Primary Care Provider: FITZGERALD, DAVID Other Clinician: Referring Provider: FITZGERALD, DAVID Treating Provider/Extender: Frann Rider in Treatment: 67 Verbal / Phone Orders: Yes Clinician: Carolyne Fiscal, Debi Read Back and Verified: Yes Diagnosis Coding Wound Cleansing Wound #1 Left,Posterior Lower Leg o Clean wound with Normal Saline. - clinic use o Cleanse wound with mild soap and water - HHRN please scrub wounds with mild soap and water o May Shower, gently pat wound dry prior to applying new dressing. o May shower with protection. Wound #2 Right,Posterior Lower  Leg o Clean wound with Normal Saline. - clinic use o Cleanse wound with mild soap and water - HHRN please scrub wounds with mild soap and water o May Shower, gently pat wound dry prior to applying new dressing. o May shower with protection. Wound #3 Right Calcaneus o Clean wound with Normal Saline. - clinic use o Cleanse wound with mild soap and water - HHRN please scrub wounds with mild soap and water o May Shower, gently pat wound dry prior to applying new dressing. o May shower with protection. Wound #4 Left,Anterior Lower Leg o Clean wound with Normal Saline. - clinic use o Cleanse wound with mild soap and water - HHRN please scrub wounds with mild soap and water o May Shower, gently pat wound dry prior to applying new dressing. o May shower with protection. Wound #5 Right,Anterior Lower Leg o Clean wound with Normal Saline. - clinic use o Cleanse wound with mild soap and water - HHRN please scrub wounds with mild soap and water o May Shower, gently pat wound dry prior to applying new dressing. o May shower with protection. Anesthetic Wound #1 Left,Posterior Lower Leg o Topical Lidocaine 4% cream applied to wound bed prior to  debridement - clinic use Wound #2 Right,Posterior Lower Leg o Topical Lidocaine 4% cream applied to wound bed prior to debridement - clinic use JOHNNAE, IMPASTATO ([id number]) Wound #3 Right Calcaneus o Topical Lidocaine 4% cream applied to wound bed prior to debridement - clinic use Wound #4 Left,Anterior Lower Leg o Topical Lidocaine 4% cream applied to wound bed prior to debridement - clinic use Wound #5 Right,Anterior Lower Leg o Topical Lidocaine 4% cream applied to wound bed prior to debridement - clinic use Skin Barriers/Peri-Wound Care Wound #1 Left,Posterior Lower Leg o Moisturizing lotion - PLEASE PUT LOTION ON THE PTS LEGS (NOT ON WOUNDS) Wound #2 Right,Posterior Lower Leg o Moisturizing lotion - PLEASE PUT LOTION ON THE PTS LEGS (NOT ON WOUNDS) Wound #3 Right Calcaneus o Moisturizing lotion - PLEASE PUT LOTION ON THE PTS LEGS (NOT ON WOUNDS) Wound #4 Left,Anterior Lower Leg o Moisturizing lotion - PLEASE PUT LOTION ON THE PTS LEGS (NOT ON WOUNDS) Wound #5 Right,Anterior Lower Leg o Moisturizing lotion - PLEASE PUT LOTION ON THE PTS LEGS (NOT ON WOUNDS) Primary Wound Dressing Wound #1 Left,Posterior Lower Leg o Prisma Ag - moisten with saline Wound #2 Right,Posterior Lower Leg o Prisma Ag - moisten with saline Wound #3 Right Calcaneus o Prisma Ag - moisten with saline Wound #5 Right,Anterior Lower Leg o Other: - steri-strip (DO NOT REMOVE) Secondary Dressing Wound #1 Left,Posterior Lower Leg o ABD pad o Dry Gauze Wound #2 Right,Posterior Lower Leg o ABD pad o Dry Gauze ETHELWYN, GILBERTSON ([id number]) Wound #3 Right Calcaneus o Dry Gauze o Other - FOAM HEEL CUP Wound #4 Left,Anterior Lower Leg o Foam Wound #5 Right,Anterior Lower Leg o Foam Dressing Change Frequency Wound #1 Left,Posterior Lower Leg o Change Dressing Monday, Wednesday, Friday - HHRN to change wraps Monday and Wednesday Wound #2 Right,Posterior Lower  Leg o Change Dressing Monday, Wednesday, Friday - HHRN to change wraps Monday and Wednesday o Change Dressing Monday, Wednesday, Friday - HHRN to change wraps Monday and Wednesday Wound #4 Left,Anterior Lower Leg o Change Dressing Monday, Wednesday, Friday - HHRN to change wraps Monday and Wednesday Wound #5 Right,Anterior Lower Leg o Change Dressing Monday, Wednesday, Friday - HHRN to change wraps Monday and Wednesday Follow-up Appointments Wound #1 Left,Posterior Lower Leg o  Return Appointment in 1 week. Wound #2 Right,Posterior Lower Leg o Return Appointment in 1 week. Wound #4 Left,Anterior Lower Leg o Return Appointment in 1 week. Wound #5 Right,Anterior Lower Leg o Return Appointment in 1 week. Edema Control Wound #1 Left,Posterior Lower Leg o 3 Layer Compression System - Bilateral - NO UNNA o Elevate legs to the level of the heart and pump ankles as often as possible o Compression Pump: Use compression pump on left lower extremity for 30 minutes, twice daily. o Compression Pump: Use compression pump on right lower extremity for 30 minutes, twice daily. o Support Garment 20-30 mm/Hg pressure to: - HHRN TO ORDER COMPRESSION STOCKINGS- [place] WRAPS BREINDEL, COLLIER. ([id number]) MEASUREMENTS ARE- RIGHT ANKLE- 27CM RIGHT CALF-46CM RIGHT HEEL TO KNEE-47.8CM LEFT ANKLE-27CM LEFT CALF-48CM LEFT HEEL TO KNEE-47.8CM Wound #2 Right,Posterior Lower Leg o 3 Layer Compression System - Bilateral - NO UNNA o Elevate legs to the level of the heart and pump ankles as often as possible o Compression Pump: Use compression pump on left lower extremity for 30 minutes, twice daily. o Compression Pump: Use compression pump on right lower extremity for 30 minutes, twice daily. o Support Garment 20-30 mm/Hg pressure to: - HHRN TO ORDER COMPRESSION STOCKINGS- [place] VELCRO COMPRESSION WRAPS MEASUREMENTS ARE- RIGHT ANKLE-  27CM RIGHT CALF-46CM RIGHT HEEL TO KNEE-47.8CM LEFT ANKLE-27CM LEFT CALF-48CM LEFT HEEL TO KNEE-47.8CM Wound #4 Left,Anterior Lower Leg o 3 Layer Compression System - Bilateral - NO UNNA o Elevate legs to the level of the heart and pump ankles as often as possible o Compression Pump: Use compression pump on left lower extremity for 30 minutes, twice daily. o Compression Pump: Use compression pump on right lower extremity for 30 minutes, twice daily. o Support Garment 20-30 mm/Hg pressure to: - HHRN TO ORDER COMPRESSION STOCKINGS- JUX[place] VELCRO COMPRESSION WRAPS MEASUREMENTS ARE- RIGHT ANKLE- 27CM RIGHT CALF-46CM RIGHT HEEL TO KNEE-47.8CM LEFT ANKLE-27CM LEFT CALF-48CM LEFT HEEL TO KNEE-47.8CM Wound #5 Right,Anterior Lower Leg o 3 Layer Compression System - Bilateral - NO UNNA o Elevate legs to the level of the heart and pump ankles as often as possible o Compression Pump: Use compression pump on left lower extremity for 30 minutes, twice daily. o Compression Pump: Use compression pump on right lower extremity for 30 minutes, twice daily. CHRISTENE, POUNDS. ([id number]) o Support Garment 20-30 mm/Hg pressure to: - HHRN TO ORDER COMPRESSION STOCKINGS- [place]UX[place] VELCRO COMPRESSION WRAPS MEASUREMENTS ARE- RIGHT ANKLE- 27CM RIGHT CALF-46CM RIGHT HEEL TO KNEE-47.8CM LEFT ANKLE-27CM LEFT CALF-48CM LEFT HEEL TO KNEE-47.8CM Additional Orders / Instructions Wound #1 Left,Posterior Lower Leg o Increase protein intake. Wound #2 Right,Posterior Lower Leg o Increase protein intake. Wound #4 Left,Anterior Lower Leg o Increase protein intake. Wound #5 Right,Anterior Lower Leg o Increase protein intake. Wound #3 Right Calcaneus o Increase protein intake. Home Health Wound #1 [place] Visits - [place] Nurse may visit PRN to address patientos wound care needs. o FACE TO FACE ENCOUNTER:  MEDICARE and MEDICAID PATIENTS: I certify that this patient is under my care and that I had a face-to-face encounter that meets the physician face-to-face encounter requirements with this patient on this date. The encounter with the patient was in whole or in part for the following MEDICAL CONDITION: (primary reason for [place]) MEDICAL NECESSITY: I certify, that based on my findings, NURSING services are a medically necessary home health service. HOME BOUND STATUS: I certify that my clinical findings support  that this patient is homebound (i.e., Due to illness or injury, pt requires aid of supportive devices such as crutches, cane, wheelchairs, walkers, the use of special transportation or the assistance of another person to leave their place of residence. There is a normal inability to leave the home and doing so requires considerable and taxing effort. Other absences are for medical reasons / religious services and are infrequent or of short duration when for other reasons). o Please direct any NON-WOUND related issues/requests for orders to patient's Primary Care Physician Wound #2 [place] Visits - [place] Nurse may visit PRN to address patientos wound care needs. SEYMONE, FORLENZA. ([id number]) o FACE TO FACE ENCOUNTER: MEDICARE and MEDICAID PATIENTS: I certify that this patient is under my care and that I had a face-to-face encounter that meets the physician face-to-face encounter requirements with this patient on this date. The encounter with the patient was in whole or in part for the following MEDICAL CONDITION: (primary reason for [place]) MEDICAL NECESSITY: I certify, that based on my findings, NURSING services are a medically necessary home health service. HOME BOUND STATUS: I certify that my clinical findings support that this patient is homebound (i.e., Due to illness or injury, pt requires aid  of supportive devices such as crutches, cane, wheelchairs, walkers, the use of special transportation or the assistance of another person to leave their place of residence. There is a normal inability to leave the home and doing so requires considerable and taxing effort. Other absences are for medical reasons / religious services and are infrequent or of short duration when for other reasons). o Please direct any NON-WOUND related issues/requests for orders to patient's Primary Care Physician Wound #4 [place] Visits - [place] Nurse may visit PRN to address patientos wound care needs. o FACE TO FACE ENCOUNTER: MEDICARE and MEDICAID PATIENTS: I certify that this patient is under my care and that I had a face-to-face encounter that meets the physician face-to-face encounter requirements with this patient on this date. The encounter with the patient was in whole or in part for the following MEDICAL CONDITION: (primary reason for [place]) MEDICAL NECESSITY: I certify, that based on my findings, NURSING services are a medically necessary home health service. HOME BOUND STATUS: I certify that my clinical findings support that this patient is homebound (i.e., Due to illness or injury, pt requires aid of supportive devices such as crutches, cane, wheelchairs, walkers, the use of special transportation or the assistance of another person to leave their place of residence. There is a normal inability to leave the home and doing so requires considerable and taxing effort. Other absences are for medical reasons / religious services and are infrequent or of short duration when for other reasons). o Please direct any NON-WOUND related issues/requests for orders to patient's Primary Care Physician Wound #3 Right Calcaneus o [place] Visits - [place] Nurse may visit PRN to address patientos wound care  needs. o FACE TO FACE ENCOUNTER: MEDICARE and MEDICAID PATIENTS: I certify that this patient is under my care and that I had a face-to-face encounter that meets the physician face-to-face encounter requirements with this patient on this date. The encounter with the patient was in whole or in part for the following MEDICAL CONDITION: (primary reason for [place]) MEDICAL NECESSITY: I certify, that based on my findings, NURSING services are a medically  necessary home health service. HOME BOUND STATUS: I certify that my clinical findings support that this patient is homebound (i.e., Due to illness or injury, pt requires aid of supportive devices such as crutches, cane, wheelchairs, walkers, the use of special transportation or the assistance of another person to leave their place of residence. There is a normal inability to leave the home and doing so requires considerable and taxing effort. Other absences are for medical reasons / religious services and are infrequent or of short duration when for other reasons). o Please direct any NON-WOUND related issues/requests for orders to patient's Primary Care Physician ZOPHIA, MARRONE ([id number]) Wound #5 [place] Visits - [place] Nurse may visit PRN to address patientos wound care needs. o FACE TO FACE ENCOUNTER: MEDICARE and MEDICAID PATIENTS: I certify that this patient is under my care and that I had a face-to-face encounter that meets the physician face-to-face encounter requirements with this patient on this date. The encounter with the patient was in whole or in part for the following MEDICAL CONDITION: (primary reason for [place]) MEDICAL NECESSITY: I certify, that based on my findings, NURSING services are a medically necessary home health service. HOME BOUND STATUS: I certify that my clinical findings support that this patient is homebound (i.e., Due to  illness or injury, pt requires aid of supportive devices such as crutches, cane, wheelchairs, walkers, the use of special transportation or the assistance of another person to leave their place of residence. There is a normal inability to leave the home and doing so requires considerable and taxing effort. Other absences are for medical reasons / religious services and are infrequent or of short duration when for other reasons). o Please direct any NON-WOUND related issues/requests for orders to patient's Primary Care Physician Electronic Signature(s) Signed: 03/07/2017 3:56:49 PM By: Christin Fudge MD, FACS Signed: 03/07/2017 5:07:14 PM By: Alric Quan Entered By: Alric Quan on 03/07/2017 09:37:40 [name] ([id number]) -------------------------------------------------------------------------------- Problem List Details Patient Name: KAROLINA, ZAMOR. Date of Service: 03/07/2017 8:45 AM Medical Record Number: [id number] Patient Account Number: [id number] Date of Birth/Sex: [birth year] (66 y.o. 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 Date of Service: 03/07/2017 8:45 AM Medical Record Number: [id number] Patient Account Number: [id number] Date of Birth/Sex: 1951-06-03 (66 y.o. Female) Treating RN: Ahmed Prima Primary Care Provider: FITZGERALD, DAVID Other Clinician: Referring Provider: FITZGERALD, DAVID Treating Provider/Extender: Frann Rider in Treatment: 37 Subjective Chief Complaint Information obtained from Patient Mrs. Lobb returns for follow-up to her bilateral lower extremity ulcers History of Present Illness (HPI) The following HPI elements were documented for the patient's wound: Location: massive swelling of both lower extremities and ulceration both lower extremities Quality: Patient reports experiencing a dull pain  to affected area(s). Severity: Patient states wound are getting worse. Duration: Patient has had the wound for >2 years prior to seeking treatment at the wound center Timing: Pain in wound is constant (hurts all the time) Context: The wound appeared gradually over time Modifying Factors: Other treatment(s) tried include:she has a lymphedema pump but uses it seldom and she's had several course of antibiotics Associated Signs and Symptoms: Patient reports having difficulty standing for long periods. 66 year old patient seen by Dr. Ola Spurr of infectious disease who has been following up for left lower extremity cellulitis and ulcer with recurrent bilateral lower extremity problems for several months. Recently she had a large right lower extremity bullae which opened out and has been ulcerated. She has seen the vascular group and has been getting Unna's wraps and has a lymphedema pump used in the past. Her prior cultures were positive for Pseudomonas, Proteus and was treated with amoxicillin. He has also been treated with 2 weeks  course of ciprofloxacin and amoxicillin.. Increase of Lasix dose helped with the edema and echo showed no systolic CHF but may be diastolic problems. Past medical history significant for diabetes mellitus type 2, venous stasis ulcer, obesity, diabetic peripheral neuropathy, status post back surgery, cholecystectomy, hysterectomy, arthroscopy of the knee. He is a former smoker and quit smoking in 1984. The patient has seen Dr. Delana Meyer who did not recommend any arterial or venous duplex studies and has been using Unna's wraps and also recommended a lymphedema pump. she has been very noncompliant with using these. 06/28/2016 -- the patient is still on antibiotics as prescribed by Dr. Ola Spurr and he is asked her to take it for 3 weeks. The patient also says she has a lot of redness and pain in the folds of her thigh and lower extremity and this is very painful. 07/26/2016 -- the patient is off antibiotics and has been getting dressing changes 3 times a week. 08/09/2016 -- is been on Cipro and is taking potassium supplements along with her Lasix and is going to be seeing Dr. Ola Spurr for a consult return visit only in November 2017. LEATTA, ALEWINE ([id number]) 08/23/2016 -- she was admitted to the hospital last week and I have reviewed these reports in detail. She was seen by Dr. Ola Spurr who noted a Pseudomonas infection which was resistant to ciprofloxacin and discharged her on Ceftazidime 1 g IV every 12 hourly anyone days. The antibiotic was to be stopped on September 11 and he would see her back in the clinic. 08/30/2016 -- had a communication from Dr. Ola Spurr that he would extend her antibiotics by a week if she continued to look like cellulitis was persisting. 09/13/2016 -- the PICC line is out and antibiotics have stopped. 10/04/16: returns today for f/u. reports utilizes compression pump twice daily. she is compliant with her compression therapy. she reports a new blister on  the right proximal posterior calf. no systemic s/s of infection. 10/11/16: returns today for f/u. she reports adherence to her compression pumps, but there is no improvement regarding her BLE edema. there is weeping of fluid. denies fever, chills, body aches or malaise. 10/18/2016 -- she has a significant cellulitis of her right lower extremity which was not there last week. I believe at this stage she will need the expertise of Dr. Ola Spurr to decide on antibiotic coverage. 10/25/2016 -- the patient has had cipro and ampicillin been started by Dr. Ola Spurr and he is awaiting further cultures. Overall she's been feeling better. 11/08/2016 -- Dr. Ola Spurr  this week who has continued with ciprofloxacin and amoxicillin and is awaiting further cultures before deciding to place her on a PICC line. 11/15/2016 -- Dr. Blane Ohara note was reviewed and her cultures growing Pseudomonas in both legs which is sensitive to Cipro and the other is resistant to Cipro and she would get a PICC line and start IV antibiotics -- Ceftazidime 1 g q 12 for 3 weeks. 11-29-16 Mrs. Spoon presents today in follow-up of her bilateral lower exterminators ulcers. She remains on IV antibiotic therapy via a PICC line per Dr. Ola Spurr; she has a follow-up appointment with him on Monday, 12/02/2016. She anticipates that the about therapy will be discontinued at that appointment. She denies any complications of nausea vomiting and/or diarrhea accompanied by this antibiotic therapy. She admits to using her lymphedema pumps twice daily with the exception of yesterday. She denies any concerns or complications with the current treatment plan. 12/06/2016 -- Dr. Ola Spurr saw her recently on 12/02/2016 and due to the fact she has had significant problems with recurrent ulceration and Pseudomonas infection he recommended 3 more weeks of antibiotics and extended it until December 25. The PICC line will be in place. Last  hemoglobin A1c on November 30 was 9% 12/27/2016 -- because of the holidays the patient's diet has been higher in salt, her dressings have not been done as required and she is not used to lymphedema pumps appropriately. This has led to her lymphedema increasing markedly. 01/24/2017 -- the patient's hospital bed is malfunctioning and hence her legs have been flat in bed and her lymphedema is increased markedly. During my examination I also noted a unstageable pressure injury to her right heel 02/07/2017 -- she has returned after 2 weeks as she was done with influenza and has been in bed a lot, with a new bed which has helped her reduce the edema due to proper elevation. Will she has a full-fledged large pressure injury to the right heel. 02/28/2017 -- hilar dressing was being taken down with scissors she had a sharp injury to the left medial calf area which is fairly superficial. MIKAH, ROTTINGHAUS. ([id number]) Objective Constitutional Pulse regular. Respirations normal and unlabored. Afebrile. Vitals Time Taken: 8:41 AM, Height: 67 in, Weight: 300 lbs, BMI: 47, Temperature: 97.7 F, Pulse: 84 bpm, Respiratory Rate: 18 breaths/min, Blood Pressure: 144/99 mmHg. Eyes Nonicteric. Reactive to light. Ears, Nose, Mouth, and Throat Lips, teeth, and gums WNL.Marland Kitchen Moist mucosa without lesions. Neck supple and nontender. No palpable supraclavicular or cervical adenopathy. Normal sized without goiter. Respiratory WNL. No retractions.. Breath sounds WNL, No rubs, rales, rhonchi, or wheeze.. Cardiovascular Heart rhythm and rate regular, no murmur or gallop.. Pedal Pulses WNL. No clubbing, cyanosis or edema. Chest Breasts symmetical and no nipple discharge.. Breast tissue WNL, no masses, lumps, or tenderness.. Gastrointestinal (GI) Abdomen without masses or tenderness.. No liver or spleen enlargement or tenderness.. Genitourinary (GU) No hydrocele, spermatocele, tenderness of the cord, or testicular  mass.Marland Kitchen Penis without lesions.Lowella Fairy without lesions. No cystocele, or rectocele. Pelvic support intact, no discharge.Marland Kitchen Urethra without masses, tenderness or scarring.Marland Kitchen Lymphatic No adneopathy. No adenopathy. No adenopathy. Musculoskeletal Adexa without tenderness or enlargement.. Digits and nails w/o clubbing, cyanosis, infection, petechiae, ischemia, or inflammatory conditions.Marland Kitchen Psychiatric Judgement and insight Intact.. No evidence of depression, anxiety, or agitation.. General Notes: the lymphedema has gone down significantly and all the wounds are looking much cleaner. KAIDYN, HERNANDES. ([id number]) The right heel was sharply debrided and once all the eschar was removed there is healthy  granulation tissue under this Integumentary (Hair, Skin) No suspicious lesions. No crepitus or fluctuance. No peri-wound warmth or erythema. No masses.. Wound #1 status is Open. Original cause of wound was Gradually Appeared. The wound is located on the Left,Posterior Lower Leg. The wound measures 2.4cm length x 0.4cm width x 0.1cm depth; 0.754cm^2 area and 0.075cm^3 volume. The wound is limited to skin breakdown. There is no tunneling or undermining noted. There is a large amount of serous drainage noted. The wound margin is flat and intact. There is no granulation within the wound bed. There is a large (67-100%) amount of necrotic tissue within the wound bed including Eschar. The periwound skin appearance exhibited: Excoriation, Maceration, Rubor. The periwound skin appearance did not exhibit: Callus, Crepitus, Induration, Rash, Scarring, Dry/Scaly, Atrophie Blanche, Cyanosis, Ecchymosis, Hemosiderin Staining, Mottled, Pallor, Erythema. Periwound temperature was noted as No Abnormality. The periwound has tenderness on palpation. Wound #2 status is Open. Original cause of wound was Gradually Appeared. The wound is located on the Right,Posterior Lower Leg. The wound measures 0.2cm length x 0.2cm  width x 0.1cm depth; 0.031cm^2 area and 0.003cm^3 volume. The wound is limited to skin breakdown. There is no tunneling or undermining noted. There is a large amount of serous drainage noted. The wound margin is indistinct and nonvisible. There is no granulation within the wound bed. There is a large (67-100%) amount of necrotic tissue within the wound bed including Adherent Slough. The periwound skin appearance exhibited: Excoriation, Maceration, Hemosiderin Staining, Rubor, Erythema. The periwound skin appearance did not exhibit: Callus, Crepitus, Induration, Rash, Scarring, Dry/Scaly, Atrophie Blanche, Cyanosis, Ecchymosis, Mottled, Pallor. The surrounding wound skin color is noted with erythema which is circumferential. Periwound temperature was noted as No Abnormality. The periwound has tenderness on palpation. Wound #3 status is Open. Original cause of wound was Pressure Injury. The wound is located on the Right Calcaneus. The wound measures 0.5cm length x 0.3cm width x 0.1cm depth; 0.118cm^2 area and 0.012cm^3 volume. There is no tunneling or undermining noted. There is a large amount of serosanguineous drainage noted. The wound margin is distinct with the outline attached to the wound base. There is large (67-100%) red, pink granulation within the wound bed. There is a small (1-33%) amount of necrotic tissue within the wound bed including Adherent Slough. The periwound skin appearance exhibited: Maceration. Periwound temperature was noted as No Abnormality. The periwound has tenderness on palpation. Wound #4 status is Open. Original cause of wound was Trauma. The wound is located on the Left,Anterior Lower Leg. The wound measures 0.7cm length x 0.4cm width x 0.1cm depth; 0.22cm^2 area and 0.022cm^3 volume. The wound is limited to skin breakdown. There is no tunneling or undermining noted. There is a large amount of serosanguineous drainage noted. The wound margin is distinct with the  outline attached to the wound base. There is medium (34-66%) red, pink granulation within the wound bed. There is a medium (34-66%) amount of necrotic tissue within the wound bed including Eschar. Periwound temperature was noted as No Abnormality. The periwound has tenderness on palpation. Wound #5 status is Open. Original cause of wound was Trauma. The wound is located on the Right,Anterior Lower Leg. The wound measures 0.7cm length x 0.9cm width x 0.1cm depth; 0.495cm^2 area and 0.049cm^3 volume. The wound is limited to skin breakdown. There is no tunneling or undermining noted. There is a medium amount of serosanguineous drainage noted. The wound margin is distinct with the outline attached to the wound base. There is large (67-100%)  red granulation within the wound bed. There is no necrotic tissue within the wound bed. Periwound temperature was noted as No Abnormality. SHERRI, MCARTHY. ([id number]) The periwound has tenderness on palpation. 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 There was a Skin/Subcutaneous Tissue Debridement ([id number]) debridement with total area of 0.15 sq cm performed by Christin Fudge, MD. with the following instrument(s): Forceps to remove Viable and Non-Viable tissue/material including Exudate, Fibrin/Slough, and Subcutaneous after achieving pain control using Lidocaine 4% Topical Solution. A time out was conducted at 09:00, prior to the start of the procedure. A Minimum amount of bleeding was controlled with Pressure. The procedure  was tolerated well with a pain level of 0 throughout and a pain level of 0 following the procedure. Post Debridement Measurements: 0.5cm length x 0.3cm width x 0.1cm depth; 0.012cm^3 volume. Post debridement Stage noted as Category/Stage II. Character of Wound/Ulcer Post Debridement requires further debridement. Severity of Tissue Post Debridement is: Fat layer exposed. Post procedure Diagnosis Wound #3: Same as Pre-Procedure Plan Wound Cleansing: Wound #1 Left,Posterior Lower Leg: Clean wound with Normal Saline. - clinic use Cleanse wound with mild soap and water - HHRN please scrub wounds with mild soap and water May Shower, gently pat wound dry prior to applying new dressing. ASHLEYMARIE, GRANDERSON ([id number]) May shower with protection. Wound #2 Right,Posterior Lower Leg: Clean wound with Normal Saline. - clinic use Cleanse wound with mild soap and water - HHRN please scrub wounds with mild soap and water May Shower, gently pat wound dry prior to applying new dressing. May shower with protection. Wound #3 Right Calcaneus: Clean wound with Normal Saline. - clinic use Cleanse wound with mild soap and water - HHRN please scrub wounds with mild soap and water May Shower, gently pat wound dry prior to applying new dressing. May shower with protection. Wound #4 Left,Anterior Lower Leg: Clean wound with Normal Saline. - clinic use Cleanse wound with mild soap and water - HHRN please scrub wounds with mild soap and water May Shower, gently pat wound dry prior to applying new dressing. May shower with protection. Wound #5 Right,Anterior Lower Leg: Clean wound with Normal Saline. - clinic use Cleanse wound with mild soap and water - HHRN please scrub wounds with mild soap and water May Shower, gently pat wound dry prior to applying new dressing. May shower with protection. Anesthetic: Wound #1 Left,Posterior Lower Leg: Topical Lidocaine 4% cream applied to wound bed prior to debridement -  clinic use Wound #2 Right,Posterior Lower Leg: Topical Lidocaine 4% cream applied to wound bed prior to debridement - clinic use Wound #3 Right Calcaneus: Topical Lidocaine 4% cream applied to wound bed prior to debridement - clinic use Wound #4 Left,Anterior Lower Leg: Topical Lidocaine 4% cream applied to wound bed prior to debridement - clinic use Wound #5 Right,Anterior Lower Leg: Topical Lidocaine 4% cream applied to wound bed prior to debridement - clinic use Skin Barriers/Peri-Wound Care: Wound #1 Left,Posterior Lower Leg: Moisturizing lotion - PLEASE PUT LOTION ON THE PTS LEGS (NOT ON WOUNDS) Wound #2 Right,Posterior Lower Leg: Moisturizing lotion -  PLEASE PUT LOTION ON THE PTS LEGS (NOT ON WOUNDS) Wound #3 Right Calcaneus: Moisturizing lotion - PLEASE PUT LOTION ON THE PTS LEGS (NOT ON WOUNDS) Wound #4 Left,Anterior Lower Leg: Moisturizing lotion - PLEASE PUT LOTION ON THE PTS LEGS (NOT ON WOUNDS) Wound #5 Right,Anterior Lower Leg: Moisturizing lotion - PLEASE PUT LOTION ON THE PTS LEGS (NOT ON WOUNDS) Primary Wound Dressing: Wound #1 Left,Posterior Lower Leg: Prisma Ag - moisten with saline Wound #2 Right,Posterior Lower Leg: Prisma Ag - moisten with saline Wound #3 Right Calcaneus: Prisma Ag - moisten with saline Wound #5 Right,Anterior Lower Leg: ANTHONY, ROLAND ([id number]) Other: - steri-strip (DO NOT REMOVE) Secondary Dressing: Wound #1 Left,Posterior Lower Leg: ABD pad Dry Gauze Wound #2 Right,Posterior Lower Leg: ABD pad Dry Gauze Wound #3 Right Calcaneus: Dry Gauze Other - FOAM HEEL CUP Wound #4 Left,Anterior Lower Leg: Foam Wound #5 Right,Anterior Lower Leg: Foam Dressing Change Frequency: Wound #1 Left,Posterior Lower Leg: Change Dressing Monday, Wednesday, Friday - HHRN to change wraps Monday and Wednesday Wound #2 Right,Posterior Lower Leg: Change Dressing Monday, Wednesday, Friday - HHRN to change wraps Monday and Wednesday Change Dressing  Monday, Wednesday, Friday - HHRN to change wraps Monday and Wednesday Wound #4 Left,Anterior Lower Leg: Change Dressing Monday, Wednesday, Friday - HHRN to change wraps Monday and Wednesday Wound #5 Right,Anterior Lower Leg: Change Dressing Monday, Wednesday, Friday - HHRN to change wraps Monday and Wednesday Follow-up Appointments: Wound #1 Left,Posterior Lower Leg: Return Appointment in 1 week. Wound #2 Right,Posterior Lower Leg: Return Appointment in 1 week. Wound #4 Left,Anterior Lower Leg: Return Appointment in 1 week. Wound #5 Right,Anterior Lower Leg: Return Appointment in 1 week. Edema Control: Wound #1 Left,Posterior Lower Leg: 3 Layer Compression System - Bilateral - NO UNNA Elevate legs to the level of the heart and pump ankles as often as possible Compression Pump: Use compression pump on left lower extremity for 30 minutes, twice daily. Compression Pump: Use compression pump on right lower extremity for 30 minutes, twice daily. Support Garment 20-30 mm/Hg pressure to: - HHRN TO ORDER COMPRESSION STOCKINGS- [employer] COMPRESSION WRAPS MEASUREMENTS ARE- RIGHT ANKLE- 27CM RIGHT CALF-46CM RIGHT HEEL TO KNEE-47.8CM LEFT ANKLE-27CM LEFT CALF-48CM LEFT HEEL TO KNEE-47.8CM Wound #2 Right,Posterior Lower Leg: 3 Layer Compression System - Bilateral - NO UNNA Elevate legs to the level of the heart and pump ankles as often as possible Compression Pump: Use compression pump on left lower extremity for 30 minutes, twice daily. Compression Pump: Use compression pump on right lower extremity for 30 minutes, twice daily. Support Garment 20-30 mm/Hg pressure to: - HHRN TO ORDER COMPRESSION STOCKINGS- [place] WRAPS MEASUREMENTS ARE- RIGHT ANKLE- [id number] ADVIKA, MCLELLAND M. ([id number]) CALF-46CM RIGHT HEEL TO KNEE-47.8CM LEFT ANKLE-27CM LEFT CALF-48CM LEFT HEEL TO KNEE-47.8CM Wound #4 Left,Anterior Lower Leg: 3 Layer Compression System - Bilateral - NO  UNNA Elevate legs to the level of the heart and pump ankles as often as possible Compression Pump: Use compression pump on left lower extremity for 30 minutes, twice daily. Compression Pump: Use compression pump on right lower extremity for 30 minutes, twice daily. Support Garment 20-30 mm/Hg pressure to: - HHRN TO ORDER COMPRESSION STOCKINGS- [employer]UXT[employer] VEL[employer] COMPRESSION WRAPS MEASUREMENTS ARE- RIGHT ANKLE- 27CM RIGHT CALF-46CM RIGHT HEEL TO KNEE-47.8CM LEFT ANKLE-27CM LEFT CALF-48CM LEFT HEEL TO KNEE-47.8CM Wound #5 Right,Anterior Lower Leg: 3 Layer Compression System - Bilateral - NO UNNA Elevate legs to the level of the heart and pump ankles as often as possible Compression  Pump: Use compression pump on left lower extremity for 30 minutes, twice daily. Compression Pump: Use compression pump on right lower extremity for 30 minutes, twice daily. Support Garment 20-30 mm/Hg pressure to: - HHRN TO ORDER COMPRESSION STOCKINGS- JUXT[employer] VELCRO COMPRESSION WRAPS MEASUREMENTS ARE- RIGHT ANKLE- 27CM RIGHT CALF-46CM RIGHT HEEL TO KNEE-47.8CM LEFT ANKLE-27CM LEFT CALF-48CM LEFT HEEL TO KNEE-47.8CM Additional Orders / Instructions: Wound #1 Left,Posterior Lower Leg: Increase protein intake. Wound #2 Right,Posterior Lower Leg: Increase protein intake. Wound #4 Left,Anterior Lower Leg: Increase protein intake. Wound #5 Right,Anterior Lower Leg: Increase protein intake. Wound #3 Right Calcaneus: Increase protein intake. Home Health: Wound #1 Left,Posterior Lower Leg: Continue Home Health Visits - [place] Nurse may visit PR[facility] to address patient s wound care needs. FACE TO FACE ENCOUNTER: MEDICARE and MEDICAID PATIENTS: I certify that this patient is under my care and that I had a face-to-face encounter that meets the physician face-to-face encounter requirements with this patient on this date. The encounter with the patient was in whole or in part for the following MEDICAL  CONDITION: (primary reason for [place]) MEDICAL NECESSITY: I certify, that based on my findings, NURSING services are a medically necessary home health service. HOME BOUND STATUS: I certify that my clinical findings support that this patient is homebound (i.e., Due to illness or injury, pt requires aid of supportive devices such as crutches, cane, wheelchairs, walkers, the use of special transportation or the assistance of another person to leave their place of residence. There is a normal inability to leave the home and doing so requires considerable and taxing effort. Other absences are for medical reasons / religious services and are infrequent or of short duration when for other reasons). Please direct any NON-WOUND related issues/requests for orders to patient's Primary Care Physician Wound #2 Right,Posterior Lower Leg: [place] Visits - [place] Nurse may visit PRN to address patient s wound care needs. FACE TO FACE ENCOUNTER: MEDICARE and MEDICAID PATIENTS: I certify that this patient is under my care and that I had a face-to-face encounter that meets the physician face-to-face encounter FLORESTINE, CARMICAL ([id number]) requirements with this patient on this date. The encounter with the patient was in whole or in part for the following MEDICAL CONDITION: (primary reason for [place]) MEDICAL NECESSITY: I certify, that based on my findings, NURSING services are a medically necessary home health service. HOME BOUND STATUS: I certify that my clinical findings support that this patient is homebound (i.e., Due to illness or injury, pt requires aid of supportive devices such as crutches, cane, wheelchairs, walkers, the use of special transportation or the assistance of another person to leave their place of residence. There is a normal inability to leave the home and doing so requires considerable and taxing effort. Other absences are for medical reasons /  religious services and are infrequent or of short duration when for other reasons). Please direct any NON-WOUND related issues/requests for orders to patient's Primary Care Physician Wound #4 Left,Anterior Lower Leg: [place] Visits - [place] Nurse may visit PRN to address patient s wound care needs. FACE TO FACE ENCOUNTER: MEDICARE and MEDICAID PATIENTS: I certify that this patient is under my care and that I had a face-to-face encounter that meets the physician face-to-face encounter requirements with this patient on this date. The encounter with the patient was in whole or in part for the following MEDICAL CONDITION: (primary reason for [place]) MEDICAL NECESSITY: I certify, that  based on my findings, NURSING services are a medically necessary home health service. HOME BOUND STATUS: I certify that my clinical findings support that this patient is homebound (i.e., Due to illness or injury, pt requires aid of supportive devices such as crutches, cane, wheelchairs, walkers, the use of special transportation or the assistance of another person to leave their place of residence. There is a normal inability to leave the home and doing so requires considerable and taxing effort. Other absences are for medical reasons / religious services and are infrequent or of short duration when for other reasons). Please direct any NON-WOUND related issues/requests for orders to patient's Primary Care Physician Wound #3 Right [place]: [place] Visits - [place] Nurse may visit PRN to address patient s wound care needs. FACE TO FACE ENCOUNTER: MEDICARE and MEDICAID PATIENTS: I certify that this patient is under my care and that I had a face-to-face encounter that meets the physician face-to-face encounter requirements with this patient on this date. The encounter with the patient was in whole or in part for the following MEDICAL CONDITION: (primary  reason for [place]) MEDICAL NECESSITY: I certify, that based on my findings, NURSING services are a medically necessary home health service. HOME BOUND STATUS: I certify that my clinical findings support that this patient is homebound (i.e., Due to illness or injury, pt requires aid of supportive devices such as crutches, cane, wheelchairs, walkers, the use of special transportation or the assistance of another person to leave their place of residence. There is a normal inability to leave the home and doing so requires considerable and taxing effort. Other absences are for medical reasons / religious services and are infrequent or of short duration when for other reasons). Please direct any NON-WOUND related issues/requests for orders to patient's Primary Care Physician Wound #5 Right,Anterior Lower Leg: [place] Visits - [place] Nurse may visit PRN to address patient s wound care needs. FACE TO FACE ENCOUNTER: MEDICARE and MEDICAID PATIENTS: I certify that this patient is under my care and that I had a face-to-face encounter that meets the physician face-to-face encounter requirements with this patient on this date. The encounter with the patient was in whole or in part for the following MEDICAL CONDITION: (primary reason for [place]) MEDICAL NECESSITY: I certify, that based on my findings, NURSING services are a medically necessary home health service. HOME BOUND STATUS: I certify that my clinical findings support that this patient is homebound (i.e., Due to illness or injury, pt requires aid of supportive devices such as crutches, cane, wheelchairs, walkers, the use of special transportation or the assistance of another person to leave their place of residence. There is a normal inability to leave the home and doing so requires considerable and taxing effort. Other absences are for medical reasons / religious services and are infrequent or of short  duration when for other reasons). Please direct any NON-WOUND related issues/requests for orders to patient's Primary Care Physician KRYSTEL, FLETCHALL. ([id number]) I have recommended: 1. silver collagen and drawtex and a 3 layer Profore to both lower extremities. 2. She will continue with elevation and exercise and her lymphedema pumps -- I had a discussion with her regarding the importance of this 3. changing her dressings twice a week. 4. right heel will need silver collagen, be protected with a heel cup and again offloading techniques have been discussed in great detail 5. I have had a detailed discussion with them, asked to  be compliant with the appropriate dietary restriction, elevation and use of her lymphedema pumps. I have commended her on being very good with her diet and elevation of the leg Electronic Signature(s) Signed: 03/07/2017 3:59:40 PM By: Christin Fudge MD, FACS Previous Signature: 03/07/2017 9:08:48 AM Version By: Christin Fudge MD, FACS Entered By: Christin Fudge on 03/07/2017 15:59:40 [name] ([id number]) -------------------------------------------------------------------------------- SuperBill Details Patient Name: JACQULYN, BARRESI. Date of Service: 03/07/2017 Medical Record Number: [id number] Patient Account Number: [id number] Date of Birth/Sex: 07/29/1951 (66 y.o. 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 Pressure ulcer of right  heel, stage 2 Facility Procedures CPT4 Code: [id number] Description: 42353 - DEB SUBQ TISSUE 20 SQ CM/< ICD-10 Description Diagnosis E11.622 Type 2 diabetes mellitus with other skin ulcer I89.0 Lymphedema, not elsewhere classified L97.222 Non-pressure chronic ulcer of left calf with fat l 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

## 2017-03-08 NOTE — Progress Notes (Signed)
Brooke Hopkins, Brooke Hopkins ([id number]) Visit Report for 03/07/2017 Arrival Information Details Patient Name: Brooke Hopkins, Brooke Hopkins. Date of Service: 03/07/2017 8:45 AM Medical Record Number: [id number] Patient Account Number: [id number] Date of Birth/Sex: 18-Mar-1951 (66 y.o. Female) Treating RN: Ahmed Prima Primary Care Ulrick Methot: FITZGERALD, DAVID Other Clinician: Referring Cecillia Menees: FITZGERALD, DAVID Treating Manjot Beumer/Extender: Frann Rider in Treatment: 69 Visit Information History Since Last Visit All ordered tests and consults were completed: No Patient Arrived: Wheel Chair Added or deleted any medications: No Arrival Time: 08:40 Any new allergies or adverse reactions: No Accompanied By: son Had a fall or experienced change in No Transfer Assistance: [employer] activities of daily living that may affect Patient Lift risk of falls: Patient Identification Verified: Yes Signs or symptoms of abuse/neglect since last No Secondary Verification Process Yes visito Completed: Hospitalized since last visit: No Patient Requires Transmission- No Has Dressing in Place as Prescribed: Yes Based Precautions: Has Compression in Place as Prescribed: Yes Patient Has Alerts: No Pain Present Now: No Electronic Signature(s) Signed: 03/07/2017 5:07:14 PM By: Alric Quan Entered By: Alric Quan on 03/07/2017 08:41:07 Brooke Hopkins ([id number]) -------------------------------------------------------------------------------- Encounter Discharge Information Details Patient Name: Brooke Hopkins, Brooke Hopkins. Date of Service: 03/07/2017 8:45 AM Medical Record Number: [id number] Patient Account Number: [id number] Date of Birth/Sex: 11-05-1951 (66 y.o. Female) Treating RN: Ahmed Prima Primary Care Bernarda Erck: FITZGERALD, DAVID Other Clinician: Referring Kaylin Marcon: FITZGERALD, DAVID Treating Alyssa Mancera/Extender: Frann Rider in Treatment: 18 Encounter Discharge Information Items Discharge  Pain Level: 0 Discharge Condition: Stable Ambulatory Status: Wheelchair Discharge Destination: Home Transportation: Private Auto Accompanied By: son Schedule Follow-up Appointment: Yes Medication Reconciliation completed No and provided to Patient/Care Lumir Demetriou: Provided on Clinical Summary of Care: 03/07/2017 Form Type Recipient Paper Patient LB Electronic Signature(s) Signed: 03/07/2017 9:28:46 AM By: Ruthine Dose Entered By: Ruthine Dose on 03/07/2017 09:28:46 Brooke Hopkins ([id number]) -------------------------------------------------------------------------------- Lower Extremity Assessment Details Patient Name: Brooke Hopkins, Brooke Hopkins. Date of Service: 03/07/2017 8:45 AM Medical Record Number: [id number] Patient Account Number: [id number] Date of Birth/Sex: 1951-09-05 (66 y.o. Female) Treating RN: Ahmed Prima Primary Care Tishanna Dunford: FITZGERALD, DAVID Other Clinician: Referring Aaleeyah Bias: FITZGERALD, DAVID Treating Jaylynn Siefert/Extender: Frann Rider in Treatment: 37 Edema Assessment Assessed: [Left: No] [Right: No] E[Left: dema] [Right: :] Calf Left: Right: Point of Measurement: 32 cm From Medial Instep 48 cm 45 cm Ankle Left: Right: Point of Measurement: 11 cm From Medial Instep 27 cm 27 cm Vascular Assessment Pulses: Dorsalis Pedis Palpable: [Left:Yes] [Right:Yes] Posterior Tibial Extremity colors, hair growth, and conditions: Extremity Color: [Left:Hyperpigmented] [Right:Hyperpigmented] Temperature of Extremity: [Left:Warm] [Right:Warm] Capillary Refill: [Left:< 3 seconds] [Right:< 3 seconds] Electronic Signature(s) Signed: 03/07/2017 5:07:14 PM By: Alric Quan Entered By: Alric Quan on 03/07/2017 09:24:31 Brooke Hopkins ([id number]) -------------------------------------------------------------------------------- Multi Wound Chart Details Patient Name: Brooke Hopkins. Date of Service: 03/07/2017 8:45 AM Medical Record Number:  [id number] Patient Account Number: [id number] Date of Birth/Sex: 12-30-1951 (66 y.o. Female) Treating RN: Ahmed Prima Primary Care Willson Lipa: FITZGERALD, DAVID Other Clinician: Referring Kayleeann Huxford: FITZGERALD, DAVID Treating Andrika Peraza/Extender: Frann Rider in Treatment: 37 Vital Signs Height(in): 67 Pulse(bpm): 84 Weight(lbs): 300 Blood Pressure 144/99 (mmHg): Body Mass Index(BMI): 47 Temperature(F): 97.7 Respiratory Rate 18 (breaths/min): Photos: [1:No Photos] [2:No Photos] [3:No Photos] Wound Location: [1:Left Lower Leg - Posterior Right Lower Leg -] [2:Posterior] [3:Right Calcaneus] Wounding Event: [1:Gradually Appeared] [2:Gradually Appeared] [3:Pressure Injury] Primary Etiology: [1:Lymphedema] [2:Lymphedema] [3:Pressure Ulcer] Date Acquired: [1:12/31/2015] [2:12/31/2015] [3:01/24/2017] Weeks of Treatment: [1:37] [2:37] [3:4] Wound Status: [1:Open] [2:Open] [3:Open] Clustered Wound: [1:No] [2:Yes] [3:No] Clustered Quantity: [1:N/A] [2:3] [  3:N/A] Measurements L x W x D 2.4x0.4x0.1 [2:0.2x0.2x0.1] [3:0.5x0.3x0.1] (cm) Area (cm) : [1:0.754] [2:0.031] [3:0.118] Volume (cm) : [1:0.075] [2:0.003] [3:0.012] % Reduction in Area: [1:99.20%] [2:100.00%] [3:97.90%] % Reduction in Volume: 99.60% [2:100.00%] [3:98.90%] Classification: [1:Partial Thickness] [2:Partial Thickness] [3:Category/Stage II] Exudate Amount: [1:Large] [2:Large] [3:Large] Exudate Type: [1:Serous] [2:Serous] [3:Serosanguineous] Exudate Color: [1:amber] [2:amber] [3:red, brown] Foul Odor After [1:Yes] [2:Yes] [3:No] Cleansing: Odor Anticipated Due to No [2:No] [3:N/A] Product Use: Wound Margin: [1:Flat and Intact] [2:Indistinct, nonvisible] [3:Distinct, outline attached] Granulation Amount: [1:None Present (0%)] [2:None Present (0%)] [3:Large (67-100%)] Granulation Quality: [1:N/A] [2:N/A] [3:Red, Pink] Necrotic Amount: [1:Large (67-100%)] [2:Large (67-100%)] [3:Small (1-33%)] Necrotic Tissue:  [1:Eschar] [2:Adherent Slough] [3:Adherent Slough] Exposed Structures: Fascia: No Fascia: No Fascia: No Fat Layer (Subcutaneous Fat Layer (Subcutaneous Fat Layer (Subcutaneous Tissue) Exposed: No Tissue) Exposed: No Tissue) Exposed: No Tendon: No Tendon: No Muscle: No Muscle: No Joint: No Joint: No Bone: No Bone: No Limited to Skin Limited to Skin Breakdown Breakdown Epithelialization: Medium (34-66%) Small (1-33%) None Debridement: N/A N/A Debridement (11042- 11047) Pre-procedure N/A N/A 09:00 Verification/Time Out Taken: Pain Control: N/A N/A Lidocaine 4% Topical Solution Tissue Debrided: N/A N/A Fibrin/Slough, Exudates, Subcutaneous Level: N/A N/A Skin/Subcutaneous Tissue Debridement Area (sq N/A N/A 0.15 cm): Instrument: N/A N/A Forceps Bleeding: N/A N/A Minimum Hemostasis Achieved: N/A N/A Pressure Procedural Pain: N/A N/A 0 Post Procedural Pain: N/A N/A 0 Debridement Treatment N/A N/A Procedure was tolerated Response: well Post Debridement N/A N/A 0.5x0.3x0.1 Measurements L x W x D (cm) Post Debridement N/A N/A 0.012 Volume: (cm) Post Debridement N/A N/A Category/Stage II Stage: Periwound Skin Texture: Excoriation: Yes Excoriation: Yes No Abnormalities Noted Induration: No Induration: No Callus: No Callus: No Crepitus: No Crepitus: No Rash: No Rash: No Scarring: No Scarring: No Periwound Skin Maceration: Yes Maceration: Yes Maceration: Yes Moisture: Dry/Scaly: No Dry/Scaly: No Periwound Skin Color: Rubor: Yes Erythema: Yes No Abnormalities Noted Atrophie Blanche: No Hemosiderin Staining: Yes Cyanosis: No Rubor: Yes Ecchymosis: No Atrophie Blanche: No Erythema: No Cyanosis: No Brooke Hopkins, Brooke Hopkins ([id number]) Hemosiderin Staining: No Ecchymosis: No Mottled: No Mottled: No Pallor: No Pallor: No Erythema Location: N/A Circumferential N/A Temperature: No Abnormality No Abnormality No Abnormality Tenderness on Yes Yes  Yes Palpation: Wound Preparation: Ulcer Cleansing: Other: Ulcer Cleansing: Other: Ulcer Cleansing: soap and water soap and water Rinsed/Irrigated with Saline Topical Anesthetic Topical Anesthetic Applied: Other: lidocaine Applied: Other: lidocaine Topical Anesthetic 4% 4% Applied: Other: LIDOCAINE 4% Procedures Performed: N/A N/A Debridement Wound Number: 4 5 N/A Photos: No Photos No Photos N/A Wound Location: Left Lower Leg - Anterior Right Lower Leg - Anterior N/A Wounding Event: Trauma Trauma N/A Primary Etiology: Trauma, Other Trauma, Other N/A Date Acquired: 02/24/2017 03/07/2017 N/A Weeks of Treatment: 1 0 N/A Wound Status: Open Open N/A Clustered Wound: No No N/A Clustered Quantity: N/A N/A N/A Measurements L x W x D 0.7x0.4x0.1 0.7x0.9x0.1 N/A (cm) Area (cm) : 0.22 0.495 N/A Volume (cm) : 0.022 0.049 N/A % Reduction in Area: 60.00% N/A N/A % Reduction in Volume: 60.00% N/A N/A Classification: Partial Thickness Partial Thickness N/A Exudate Amount: Large Medium N/A Exudate Type: Serosanguineous Serosanguineous N/A Exudate Color: red, brown red, brown N/A Foul Odor After No No N/A Cleansing: Odor Anticipated Due to N/A N/A N/A Product Use: Wound Margin: Distinct, outline attached Distinct, outline attached N/A Granulation Amount: Medium (34-66%) Large (67-100%) N/A Granulation Quality: Red, Pink Red N/A Necrotic Amount: Medium (34-66%) None Present (0%) N/A Necrotic Tissue: Eschar N/A N/A Exposed Structures: Fascia: No Fascia:  No N/A Fat Layer (Subcutaneous Fat Layer (Subcutaneous Tissue) Exposed: No Tissue) Exposed: No Tendon: No Tendon: No Brooke Hopkins, Brooke Hopkins. ([id number]) Muscle: No Muscle: No Joint: No Joint: No Bone: No Bone: No Limited to Skin Limited to Skin Breakdown Breakdown Epithelialization: None None N/A Debridement: N/A N/A N/A Pain Control: N/A N/A N/A Tissue Debrided: N/A N/A N/A Level: N/A N/A N/A Debridement Area (sq N/A N/A  N/A cm): Instrument: N/A N/A N/A Bleeding: N/A N/A N/A Hemostasis Achieved: N/A N/A N/A Procedural Pain: N/A N/A N/A Post Procedural Pain: N/A N/A N/A Debridement Treatment N/A N/A N/A Response: Post Debridement N/A N/A N/A Measurements L x W x D (cm) Post Debridement N/A N/A N/A Volume: (cm) Post Debridement N/A N/A N/A Stage: Periwound Skin Texture: No Abnormalities Noted No Abnormalities Noted N/A Periwound Skin No Abnormalities Noted No Abnormalities Noted N/A Moisture: Periwound Skin Color: No Abnormalities Noted No Abnormalities Noted N/A Erythema Location: N/A N/A N/A Temperature: No Abnormality No Abnormality N/A Tenderness on Yes Yes N/A Palpation: Wound Preparation: Ulcer Cleansing: Ulcer Cleansing: N/A Rinsed/Irrigated with Rinsed/Irrigated with Saline, Other: soap and Saline, Other: soap and water water Topical Anesthetic Topical Anesthetic Applied: Other: lidocaine Applied: Other: lidocaine 4% 4% Procedures Performed: N/A N/A N/A Treatment Notes Electronic Signature(s) Signed: 03/07/2017 9:06:41 AM By: Christin Fudge MD, FACS ALVA, KUENZEL ([id number]) Entered By: Christin Fudge on 03/07/2017 09:06:40 Brooke Hopkins ([id number]) -------------------------------------------------------------------------------- [place] Details Patient Name: Brooke Hopkins, Brooke Hopkins. Date of Service: 03/07/2017 8:45 AM Medical Record Number: [id number] Patient Account Number: [id number] Date of Birth/Sex: 1951-09-06 (66 y.o. Female) Treating RN: Ahmed Prima Primary Care Jaelyne Deeg: FITZGERALD, DAVID Other Clinician: Referring Yates Weisgerber: FITZGERALD, DAVID Treating Maizee Reinhold/Extender: Frann Rider in Treatment: 71 Active Inactive ` Orientation to the Wound Care Program Nursing Diagnoses: Knowledge deficit related to the wound healing center program Goals: Patient/caregiver will verbalize understanding of the [facility] Date  Initiated: 06/21/2016 Target Resolution Date: 03/29/2017 Goal Status: Active Interventions: Provide education on orientation to the wound center Notes: ` Wound/Skin Impairment Nursing Diagnoses: Impaired tissue integrity Goals: Patient/caregiver will verbalize understanding of skin care regimen Date Initiated: 06/21/2016 Target Resolution Date: 03/29/2017 Goal Status: Active Ulcer/skin breakdown will have a volume reduction of 30% by week 4 Date Initiated: 06/21/2016 Target Resolution Date: 03/29/2017 Goal Status: Active Ulcer/skin breakdown will have a volume reduction of 50% by week 8 Date Initiated: 06/21/2016 Target Resolution Date: 03/29/2017 Goal Status: Active Ulcer/skin breakdown will have a volume reduction of 80% by week 12 Date Initiated: 06/21/2016 Target Resolution Date: 03/29/2017 Goal Status: Active Ulcer/skin breakdown will heal within 14 weeks Date Initiated: 06/21/2016 Target Resolution Date: 03/29/2017 Brooke Hopkins, Brooke Hopkins ([id number]) Goal Status: Active Interventions: Assess patient/caregiver ability to obtain necessary supplies Assess patient/caregiver ability to perform ulcer/skin care regimen upon admission and as needed Assess ulceration(s) every visit Provide education on ulcer and skin care Notes: Electronic Signature(s) Signed: 03/07/2017 5:07:14 PM By: Alric Quan Entered By: Alric Quan on 03/07/2017 09:03:15 Brooke Hopkins ([id number]) -------------------------------------------------------------------------------- Pain Assessment Details Patient Name: Brooke Hopkins, Brooke Hopkins. Date of Service: 03/07/2017 8:45 AM Medical Record Number: [id number] Patient Account Number: [id number] Date of Birth/Sex: Jul 02, 1951 (66 y.o. Female) Treating RN: Ahmed Prima Primary Care Sapna Padron: FITZGERALD, DAVID Other Clinician: Referring Izzah Pasqua: FITZGERALD, DAVID Treating Mahin Guardia/Extender: Frann Rider in Treatment: 37 Active Problems Location of  Pain Severity and Description of Pain Patient Has Paino No Site Locations With Dressing Change: No Pain Management and Medication Current Pain Management: Electronic Signature(s) Signed: 03/07/2017 5:07:14 PM  By: Alric Quan Entered By: Alric Quan on 03/07/2017 08:41:12 Brooke Hopkins ([id number]) -------------------------------------------------------------------------------- Patient/Caregiver Education Details Patient Name: NAFEESAH, LAPAGLIA. Date of Service: 03/07/2017 8:45 AM Medical Record Number: [id number] Patient Account Number: [id number] Date of Birth/Gender: 09/04/1951 (66 y.o. Female) Treating RN: Ahmed Prima Primary Care Physician: FITZGERALD, DAVID Other Clinician: Referring Physician: FITZGERALD, DAVID Treating Physician/Extender: Frann Rider in Treatment: 7 Education Assessment Education Provided To: Patient Education Topics Provided Wound/Skin Impairment: Handouts: Other: keep wraps dry Methods: Demonstration, Explain/Verbal Responses: State content correctly Electronic Signature(s) Signed: 03/07/2017 5:07:14 PM By: Alric Quan Entered By: Alric Quan on 03/07/2017 09:07:53 Brooke Hopkins ([id number]) -------------------------------------------------------------------------------- Wound Assessment Details Patient Name: Brooke Hopkins, Brooke Hopkins. Date of Service: 03/07/2017 8:45 AM Medical Record Number: [id number] Patient Account Number: [id number] Date of Birth/Sex: 18-Aug-1951 (66 y.o. Female) Treating RN: Ahmed Prima Primary Care Natalynn Pedone: FITZGERALD, DAVID Other Clinician: Referring Ishika Chesterfield: FITZGERALD, DAVID Treating Timira Bieda/Extender: Frann Rider in Treatment: 37 Wound Status Wound Number: 1 Primary Etiology: Lymphedema Wound Location: Left Lower Leg - Posterior Wound Status: Open Wounding Event: Gradually Appeared Date Acquired: 12/31/2015 Weeks Of Treatment: 37 Clustered Wound: No Photos Photo Uploaded  By: Alric Quan on 03/07/2017 11:40:16 Wound Measurements Length: (cm) 2.4 Width: (cm) 0.4 Depth: (cm) 0.1 Area: (cm) 0.754 Volume: (cm) 0.075 % Reduction in Area: 99.2% % Reduction in Volume: 99.6% Epithelialization: Medium (34-66%) Tunneling: No Undermining: No Wound Description Classification: Partial Thickness Wound Margin: Flat and Intact Exudate Amount: Large Exudate Type: Serous Exudate Color: amber Foul Odor After Cleansing: Yes Due to Product Use: No Slough/Fibrino Yes Wound Bed Granulation Amount: None Present (0%) Exposed Structure Necrotic Amount: Large (67-100%) Fascia Exposed: No Necrotic Quality: Eschar Fat Layer (Subcutaneous Tissue) Exposed: No Tendon Exposed: No MACKAYLA, MULLINS ([id number]) Muscle Exposed: No Joint Exposed: No Bone Exposed: No Limited to Skin Breakdown Periwound Skin Texture Texture Color No Abnormalities Noted: No No Abnormalities Noted: No Callus: No Atrophie Blanche: No Crepitus: No Cyanosis: No Excoriation: Yes Ecchymosis: No Induration: No Erythema: No Rash: No Hemosiderin Staining: No Scarring: No Mottled: No Pallor: No Moisture Rubor: Yes No Abnormalities Noted: No Dry / Scaly: No Temperature / Pain Maceration: Yes Temperature: No Abnormality Tenderness on Palpation: Yes Wound Preparation Ulcer Cleansing: Other: soap and water, Topical Anesthetic Applied: Other: lidocaine 4%, Treatment Notes Wound #1 (Left, Posterior Lower Leg) 1. Cleansed with: Clean wound with Normal Saline Cleanse wound with antibacterial soap and water 2. Anesthetic Topical Lidocaine 4% cream to wound bed prior to debridement 4. Dressing Applied: Prisma Ag 5. Secondary Dressing Applied ABD Pad Dry Gauze 7. Secured with Tape 3 Layer Compression System - Bilateral Electronic Signature(s) Signed: 03/07/2017 5:07:14 PM By: Alric Quan Entered By: Alric Quan on 03/07/2017 08:53:13 Brooke Hopkins  ([id number]) -------------------------------------------------------------------------------- Wound Assessment Details Patient Name: Brooke Hopkins, BUDDE. Date of Service: 03/07/2017 8:45 AM Medical Record Number: [id number] Patient Account Number: [id number] Date of Birth/Sex: [birth year] (66 y.o. Female) Treating RN: Ahmed Prima Primary Care Morad Tal: FITZGERALD, DAVID Other Clinician: Referring Komal Stangelo: FITZGERALD, DAVID Treating Damareon Lanni/Extender: Frann Rider in Treatment: 37 Wound Status Wound Number: 2 Primary Etiology: Lymphedema Wound Location: Right Lower Leg - Posterior Wound Status: Open Wounding Event: Gradually Appeared Date Acquired: 12/31/2015 Weeks Of Treatment: 37 Clustered Wound: Yes Photos Photo Uploaded By: Alric Quan on 03/07/2017 11:40:41 Wound Measurements Length: (cm) 0.2 Width: (cm) 0.2 Depth: (cm) 0.1 Clustered Quantity: 3 Area: (cm) 0.031 Volume: (cm) 0.003 % Reduction in Area: 100% % Reduction in Volume: 100% Epithelialization: Small (1-33%) Tunneling:  No Undermining: No Wound Description Classification: Partial Thickness Wound Margin: Indistinct, nonvisible Exudate Amount: Large Exudate Type: Serous Exudate Color: amber Foul Odor After Cleansing: Yes Due to Product Use: No Slough/Fibrino Yes Wound Bed Granulation Amount: None Present (0%) Exposed Structure Necrotic Amount: Large (67-100%) Fascia Exposed: No Necrotic Quality: Adherent Slough Fat Layer (Subcutaneous Tissue) Exposed: No Brooke Hopkins, BATTIN ([id number]) Tendon Exposed: No Muscle Exposed: No Joint Exposed: No Bone Exposed: No Limited to Skin Breakdown Periwound Skin Texture Texture Color No Abnormalities Noted: No No Abnormalities Noted: No Callus: No Atrophie Blanche: No Crepitus: No Cyanosis: No Excoriation: Yes Ecchymosis: No Induration: No Erythema: Yes Rash: No Erythema Location: Circumferential Scarring: No Hemosiderin Staining:  Yes Mottled: No Moisture Pallor: No No Abnormalities Noted: No Rubor: Yes Dry / Scaly: No Maceration: Yes Temperature / Pain Temperature: No Abnormality Tenderness on Palpation: Yes Wound Preparation Ulcer Cleansing: Other: soap and water, Topical Anesthetic Applied: Other: lidocaine 4%, Treatment Notes Wound #2 (Right, Posterior Lower Leg) 1. Cleansed with: Clean wound with Normal Saline Cleanse wound with antibacterial soap and water 2. Anesthetic Topical Lidocaine 4% cream to wound bed prior to debridement 4. Dressing Applied: Prisma Ag 5. Secondary Dressing Applied ABD Pad Dry Gauze 7. Secured with Tape 3 Layer Compression System - Bilateral Electronic Signature(s) Signed: 03/07/2017 5:07:14 PM By: Alric Quan Entered By: Alric Quan on 03/07/2017 08:54:13 AILY, TZENG ([id number]) SYRIANNA, SCHILLACI ([id number]) -------------------------------------------------------------------------------- Wound Assessment Details Patient Name: SHAREA, GUINTHER. Date of Service: 03/07/2017 8:45 AM Medical Record Number: [id number] Patient Account Number: [id number] Date of Birth/Sex: March 07, 1951 (66 y.o. Female) Treating RN: Ahmed Prima Primary Care Omaree Fuqua: FITZGERALD, DAVID Other Clinician: Referring Greene Diodato: FITZGERALD, DAVID Treating Mckinley Olheiser/Extender: Frann Rider in Treatment: 31 Wound Status Wound Number: 3 Primary Etiology: Pressure Ulcer Wound Location: Right Calcaneus Wound Status: Open Wounding Event: Pressure Injury Date Acquired: 01/24/2017 Weeks Of Treatment: 4 Clustered Wound: No Photos Photo Uploaded By: Alric Quan on 03/07/2017 11:40:42 Wound Measurements Length: (cm) 0.5 Width: (cm) 0.3 Depth: (cm) 0.1 Area: (cm) 0.118 Volume: (cm) 0.012 % Reduction in Area: 97.9% % Reduction in Volume: 98.9% Epithelialization: None Tunneling: No Undermining: No Wound Description Classification: Category/Stage II Foul Odor  Aft Wound Margin: Distinct, outline attached Slough/Fibrin Exudate Amount: Large Exudate Type: Serosanguineous Exudate Color: red, brown er Cleansing: No o No Wound Bed Granulation Amount: Large (67-100%) Exposed Structure Granulation Quality: Red, Pink Fascia Exposed: No Necrotic Amount: Small (1-33%) Fat Layer (Subcutaneous Tissue) Exposed: No Necrotic Quality: [address] Sandara M. ([id number]) Periwound Skin Texture Texture Color No Abnormalities Noted: No No Abnormalities Noted: No Moisture Temperature / Pain No Abnormalities Noted: No Temperature: No Abnormality Maceration: Yes Tenderness on Palpation: Yes Wound Preparation Ulcer Cleansing: Rinsed/Irrigated with Saline Topical Anesthetic Applied: Other: LIDOCAINE 4%, Treatment Notes Wound #3 (Right Calcaneus) 1. Cleansed with: Clean wound with Normal Saline Cleanse wound with antibacterial soap and water 2. Anesthetic Topical Lidocaine 4% cream to wound bed prior to debridement 4. Dressing Applied: Prisma Ag 5. Secondary Dressing Applied ABD Pad Dry Gauze 7. Secured with Tape 3 Layer Compression System - Bilateral Electronic Signature(s) Signed: 03/07/2017 5:07:14 PM By: Alric Quan Entered By: Alric Quan on 03/07/2017 08:54:46 Brooke Hopkins ([id number]) -------------------------------------------------------------------------------- Wound Assessment Details Patient Name: BRANDALYN, HARTING. Date of Service: 03/07/2017 8:45 AM Medical Record Number: [id number] Patient Account Number: [id number] Date of Birth/Sex: 1951/06/27 (66 y.o. Female) Treating RN: Ahmed Prima Primary Care Keiffer Piper: FITZGERALD, DAVID Other Clinician: Referring Cristy Colmenares: FITZGERALD, DAVID Treating Ewelina Naves/Extender: Frann Rider  in Treatment: 37 Wound Status Wound Number: 4 Primary Etiology: Trauma, Other Wound Location: Left Lower Leg - Anterior Wound Status: Open Wounding Event: Trauma Date  Acquired: 02/24/2017 Weeks Of Treatment: 1 Clustered Wound: No Photos Photo Uploaded By: Alric Quan on 03/07/2017 11:41:05 Wound Measurements Length: (cm) 0.7 Width: (cm) 0.4 Depth: (cm) 0.1 Area: (cm) 0.22 Volume: (cm) 0.022 % Reduction in Area: 60% % Reduction in Volume: 60% Epithelialization: None Tunneling: No Undermining: No Wound Description Classification: Partial Thickness Foul Odor Aft Wound Margin: Distinct, outline attached Slough/Fibrin Exudate Amount: Large Exudate Type: Serosanguineous Exudate Color: red, brown er Cleansing: No o No Wound Bed Granulation Amount: Medium (34-66%) Exposed Structure Granulation Quality: Red, Pink Fascia Exposed: No Necrotic Amount: Medium (34-66%) Fat Layer (Subcutaneous Tissue) Exposed: No Necrotic Quality: Eschar Tendon Exposed: No SETAREH, ROM ([id number]) Muscle Exposed: No Joint Exposed: No Bone Exposed: No Limited to Skin Breakdown Periwound Skin Texture Texture Color No Abnormalities Noted: No No Abnormalities Noted: No Moisture Temperature / Pain No Abnormalities Noted: No Temperature: No Abnormality Tenderness on Palpation: Yes Wound Preparation Ulcer Cleansing: Rinsed/Irrigated with Saline, Other: soap and water, Topical Anesthetic Applied: Other: lidocaine 4%, Treatment Notes Wound #4 (Left, Anterior Lower Leg) 1. Cleansed with: Clean wound with Normal Saline 2. Anesthetic Topical Lidocaine 4% cream to wound bed prior to debridement 5. Secondary Dressing Applied Foam 7. Secured with Tape 3 Layer Compression System - Bilateral Electronic Signature(s) Signed: 03/07/2017 5:07:14 PM By: Alric Quan Entered By: Alric Quan on 03/07/2017 08:55:17 Brooke Hopkins ([id number]) -------------------------------------------------------------------------------- Wound Assessment Details Patient Name: SULLIVAN, BLASING. Date of Service: 03/07/2017 8:45 AM Medical Record Number:  [id number] Patient Account Number: [id number] Date of Birth/Sex: [birth year] (66 y.o. Female) Treating RN: Ahmed Prima Primary Care Johnetta Sloniker: FITZGERALD, DAVID Other Clinician: Referring Gardy Montanari: FITZGERALD, DAVID Treating Shea Swalley/Extender: Frann Rider in Treatment: 37 Wound Status Wound Number: 5 Primary Etiology: Trauma, Other Wound Location: Right Lower Leg - Anterior Wound Status: Open Wounding Event: Trauma Date Acquired: 03/07/2017 Weeks Of Treatment: 0 Clustered Wound: No Photos Photo Uploaded By: Alric Quan on 03/07/2017 11:41:05 Wound Measurements Length: (cm) 0.7 Width: (cm) 0.9 Depth: (cm) 0.1 Area: (cm) 0.495 Volume: (cm) 0.049 % Reduction in Area: % Reduction in Volume: Epithelialization: None Tunneling: No Undermining: No Wound Description Classification: Partial Thickness Foul Odor Aft Wound Margin: Distinct, outline attached Slough/Fibrin Exudate Amount: Medium Exudate Type: Serosanguineous Exudate Color: red, brown er Cleansing: No o No Wound Bed Granulation Amount: Large (67-100%) Exposed Structure Granulation Quality: Red Fascia Exposed: No Necrotic Amount: None Present (0%) Fat Layer (Subcutaneous Tissue) Exposed: No Tendon Exposed: No ARIELLAH, FAUST ([id number]) Muscle Exposed: No Joint Exposed: No Bone Exposed: No Limited to Skin Breakdown Periwound Skin Texture Texture Color No Abnormalities Noted: No No Abnormalities Noted: No Moisture Temperature / Pain No Abnormalities Noted: No Temperature: No Abnormality Tenderness on Palpation: Yes Wound Preparation Ulcer Cleansing: Rinsed/Irrigated with Saline, Other: soap and water, Topical Anesthetic Applied: Other: lidocaine 4%, Treatment Notes Wound #5 (Right, Anterior Lower Leg) 1. Cleansed with: Clean wound with Normal Saline 2. Anesthetic Topical Lidocaine 4% cream to wound bed prior to debridement 5. Secondary Dressing Applied Foam 7. Secured  with Tape 3 Layer Compression System - Bilateral Electronic Signature(s) Signed: 03/07/2017 5:07:14 PM By: Alric Quan Entered By: Alric Quan on 03/07/2017 08:52:11 Brooke Hopkins ([id number]) -------------------------------------------------------------------------------- Vitals Details Patient Name: TAYTEN, HEBER. Date of Service: 03/07/2017 8:45 AM Medical Record Number: [id number] Patient Account Number: [id number] Date of Birth/Sex: 1951-05-17 (65 y.o.  Female) Treating RN: Ahmed Prima Primary Care Tahje Borawski: FITZGERALD, DAVID Other Clinician: Referring Arvella Massingale: FITZGERALD, DAVID Treating Seferino Oscar/Extender: Frann Rider in Treatment: 37 Vital Signs Time Taken: 08:41 Temperature (F): 97.7 Height (in): 67 Pulse (bpm): 84 Weight (lbs): 300 Respiratory Rate (breaths/min): 18 Body Mass Index (BMI): 47 Blood Pressure (mmHg): 144/99 Reference Range: 80 - 120 mg / dl Electronic Signature(s) Signed: 03/07/2017 5:07:14 PM By: Alric Quan Entered By: Alric Quan on 03/07/2017 08:48:19

## 2017-03-14 ENCOUNTER — Encounter: Payer: 59 | Admitting: Surgery

## 2017-03-14 DIAGNOSIS — L97521 Non-pressure chronic ulcer of other part of left foot limited to breakdown of skin: Secondary | ICD-10-CM | POA: Diagnosis not present

## 2017-03-14 DIAGNOSIS — L97821 Non-pressure chronic ulcer of other part of left lower leg limited to breakdown of skin: Secondary | ICD-10-CM | POA: Diagnosis not present

## 2017-03-14 DIAGNOSIS — E11622 Type 2 diabetes mellitus with other skin ulcer: Secondary | ICD-10-CM | POA: Diagnosis not present

## 2017-03-14 DIAGNOSIS — L97222 Non-pressure chronic ulcer of left calf with fat layer exposed: Secondary | ICD-10-CM | POA: Diagnosis not present

## 2017-03-14 DIAGNOSIS — L97811 Non-pressure chronic ulcer of other part of right lower leg limited to breakdown of skin: Secondary | ICD-10-CM | POA: Diagnosis not present

## 2017-03-14 DIAGNOSIS — Z87891 Personal history of nicotine dependence: Secondary | ICD-10-CM | POA: Diagnosis not present

## 2017-03-14 DIAGNOSIS — L97211 Non-pressure chronic ulcer of right calf limited to breakdown of skin: Secondary | ICD-10-CM | POA: Diagnosis not present

## 2017-03-14 DIAGNOSIS — L97221 Non-pressure chronic ulcer of left calf limited to breakdown of skin: Secondary | ICD-10-CM | POA: Diagnosis not present

## 2017-03-14 DIAGNOSIS — L89629 Pressure ulcer of left heel, unspecified stage: Secondary | ICD-10-CM | POA: Diagnosis not present

## 2017-03-14 DIAGNOSIS — I890 Lymphedema, not elsewhere classified: Secondary | ICD-10-CM | POA: Diagnosis not present

## 2017-03-14 DIAGNOSIS — L97212 Non-pressure chronic ulcer of right calf with fat layer exposed: Secondary | ICD-10-CM | POA: Diagnosis not present

## 2017-03-15 NOTE — Progress Notes (Addendum)
Brooke Hopkins ([id number]) Visit Report for 03/14/2017 Arrival Information Details Patient Name: Brooke Hopkins, Brooke Hopkins. Date of Service: 03/14/2017 10:30 AM Medical Record Number: [id number] Patient Account Number: [id number] Date of Birth/Sex: 09-Dec-1951 (66 y.o. Female) Treating RN: [name] Barman Primary Care Shirlene Andaya: FITZGERALD, DAVID Other Clinician: Referring Cyncere Sontag: FITZGERALD, DAVID Treating Naya Ilagan/Extender: Frann Rider in Treatment: 31 Visit Information History Since Last Visit Added or deleted any medications: No Patient Arrived: Wheel Chair Any new allergies or adverse reactions: No Arrival Time: 10:40 Had a fall or experienced change in No activities of daily living that may affect Accompanied By: son risk of falls: Transfer Assistance: Manual Signs or symptoms of abuse/neglect since last No Patient Identification Verified: Yes visito Secondary Verification Process Yes Hospitalized since last visit: No Completed: Has Dressing in Place as Prescribed: Yes Patient Requires Transmission-Based No Pain Present Now: No Precautions: Patient Has Alerts: No Electronic Signature(s) Signed: 03/14/2017 4:59:14 PM By: Gretta Cool, RN, BSN, Kim RN, BSN Entered By: Gretta Cool, RN, BSN, Kim on 03/14/2017 10:40:45 [name] ([id number]) -------------------------------------------------------------------------------- Encounter Discharge Information Details Patient Name: Brooke Hopkins. Date of Service: 03/14/2017 10:30 AM Medical Record Number: [id number] Patient Account Number: [id number] Date of Birth/Sex: [birth year] (66 y.o. Female) Treating RN: [name] Barman Primary Care Zahrah Sutherlin: FITZGERALD, DAVID Other Clinician: Referring Xavier Munger: FITZGERALD, DAVID Treating Janaya Broy/Extender: Frann Rider in Treatment: 26 Encounter Discharge Information Items Discharge Pain Level: 0 Discharge Condition: Stable Ambulatory Status: Wheelchair Discharge Destination:  Home Transportation: Private Auto Accompanied By: husband Schedule Follow-up Appointment: Yes Medication Reconciliation completed and provided to Patient/Care Yes Congetta Odriscoll: Provided on Clinical Summary of Care: 03/14/2017 Form Type Recipient Paper Patient LB Electronic Signature(s) Signed: 03/17/2017 5:09:44 PM By: Gretta Cool RN, BSN, Kim RN, BSN Previous Signature: 03/14/2017 11:43:02 AM Version By: Ruthine Dose Entered By: Gretta Cool RN, BSN, Kim on 03/17/2017 12:34:32 [name] ([id number]) -------------------------------------------------------------------------------- Lower Extremity Assessment Details Patient Name: Brooke Hopkins. Date of Service: 03/14/2017 10:30 AM Medical Record Number: [id number] Patient Account Number: [id number] Date of Birth/Sex: 03-09-1951 (66 y.o. Female) Treating RN: [name] Barman Primary Care Reyaansh Merlo: FITZGERALD, DAVID Other Clinician: Referring Ginelle Bays: FITZGERALD, DAVID Treating Meadow Abramo/Extender: Frann Rider in Treatment: 38 Edema Assessment Assessed: [Left: No] [Right: No] E[Left: dema] [Right: :] Calf Left: Right: Point of Measurement: 32 cm From Medial Instep 47.6 cm 48.5 cm Ankle Left: Right: Point of Measurement: 11 cm From Medial Instep 30 cm 28.2 cm Vascular Assessment Pulses: Dorsalis Pedis Palpable: [Left:Yes] [Right:Yes] Posterior Tibial Extremity colors, hair growth, and conditions: Extremity Color: [Left:Red] [Right:Red] Hair Growth on Extremity: [Left:Yes] [Right:Yes] Temperature of Extremity: [Left:Warm] [Right:Warm] Capillary Refill: [Left:< 3 seconds] Toe Nail Assessment Left: Right: Thick: Yes Yes Discolored: No No Deformed: No No Improper Length and Hygiene: No No Electronic Signature(s) Signed: 03/14/2017 4:59:14 PM By: Gretta Cool, RN, BSN, Kim RN, BSN Entered By: Gretta Cool, RN, BSN, Kim on 03/14/2017 10:53:54 [name]  ([id number]) -------------------------------------------------------------------------------- Multi Wound Chart Details Patient Name: Brooke Hopkins. Date of Service: 03/14/2017 10:30 AM Medical Record Number: [id number] Patient Account Number: [id number] Date of Birth/Sex: [birth year] (66 y.o. Female) Treating RN: [name] Barman Primary Care Glendine Swetz: FITZGERALD, DAVID Other Clinician: Referring Cristella Stiver: FITZGERALD, DAVID Treating Jamus Loving/Extender: Frann Rider in Treatment: 38 Vital Signs Height(in): 67 Pulse(bpm): 84 Weight(lbs): 300 Blood Pressure 195/58 (mmHg): Body Mass Index(BMI): 47 Temperature(F): 98.0 Respiratory Rate 18 (breaths/min): Photos: Wound Location: Left Lower Leg - Posterior Right Lower Leg - Right Calcaneus Posterior Wounding Event: Gradually Appeared Gradually Appeared Pressure Injury Primary Etiology: Lymphedema Lymphedema Pressure  Ulcer Date Acquired: 12/31/2015 12/31/2015 01/24/2017 Weeks of Treatment: 38 38 5 Wound Status: Open Open Open Clustered Wound: No Yes No Clustered Quantity: N/A 3 N/A Measurements L x W x D 0.2x0.3x0.1 0.2x0.2x0.1 0.1x0.1x0.1 (cm) Area (cm) : 0.047 0.031 0.008 Volume (cm) : 0.005 0.003 0.001 % Reduction in Area: 100.00% 100.00% 99.90% % Reduction in Volume: 100.00% 100.00% 99.90% Classification: Partial Thickness Partial Thickness Category/Stage II Exudate Amount: Large None Present Large Exudate Type: Serous N/A Serosanguineous Exudate Color: amber N/A red, brown Foul Odor After Yes Yes No Cleansing: Odor Anticipated Due to No No N/A Product Use: Wound Margin: Flat and Intact Indistinct, nonvisible Distinct, outline attached [name], [name]. ([id number]) Granulation Amount: None Present (0%) None Present (0%) None Present (0%) Granulation Quality: N/A N/A N/A Necrotic Amount: Small (1-33%) Small (1-33%) None Present (0%) Necrotic Tissue: Eschar Eschar N/A Exposed Structures: Fascia: No Fascia:  No Fascia: No Fat Layer (Subcutaneous Fat Layer (Subcutaneous Fat Layer (Subcutaneous Tissue) Exposed: No Tissue) Exposed: No Tissue) Exposed: No Tendon: No Tendon: No Muscle: No Muscle: No Joint: No Joint: No Bone: No Bone: No Limited to Skin Limited to Skin Breakdown Breakdown Epithelialization: Large (67-100%) Large (67-100%) Large (67-100%) Periwound Skin Texture: Excoriation: No Excoriation: No No Abnormalities Noted Induration: No Induration: No Callus: No Callus: No Crepitus: No Crepitus: No Rash: No Rash: No Scarring: No Scarring: No Periwound Skin Maceration: No Dry/Scaly: Yes Maceration: Yes Moisture: Dry/Scaly: No Maceration: No Periwound Skin Color: Ecchymosis: Yes Atrophie Blanche: No No Abnormalities Noted Rubor: Yes Cyanosis: No Atrophie Blanche: No Ecchymosis: No Cyanosis: No Erythema: No Erythema: No Hemosiderin Staining: No Hemosiderin Staining: No Mottled: No Mottled: No Pallor: No Pallor: No Rubor: No Temperature: No Abnormality No Abnormality No Abnormality Tenderness on Yes Yes Yes Palpation: Wound Preparation: Ulcer Cleansing: Other: Ulcer Cleansing: Other: Ulcer Cleansing: soap and water soap and water Rinsed/Irrigated with Saline Topical Anesthetic Topical Anesthetic Applied: None Applied: Other: lidocaine Topical Anesthetic 4% Applied: None Wound Number: [id number]  Photos: Wound Location: Left Lower Leg - Anterior Right Lower Leg - Anterior Left Toe Great Wounding Event: Trauma Trauma Gradually Appeared Primary Etiology: Trauma, Other Trauma, Other Pressure Ulcer Date Acquired: 02/24/2017 03/07/2017 02/28/2017 [name] ([id number]) Weeks of Treatment: 2 1 0 Wound Status: Open Open Open Clustered Wound: No No No Clustered Quantity: N/A N/A N/A Measurements L x W x D 0.3x0.4x0.1 0.1x0.1x0.1 1x2x0.1 (cm) Area (cm) : 0.094 0.008 1.571 Volume (cm) : 0.009 0.001 0.157 % Reduction in Area: 82.90% 98.40% 0.00% %  Reduction in Volume: 83.60% 98.00% 0.00% Classification: Partial Thickness Partial Thickness Category/Stage II Exudate Amount: Large Medium Small Exudate Type: Serosanguineous Serosanguineous Serous Exudate Color: red, brown red, brown amber Foul Odor After No No No Cleansing: Odor Anticipated Due to N/A N/A N/A Product Use: Wound Margin: Distinct, outline attached Distinct, outline attached Indistinct, nonvisible Granulation Amount: None Present (0%) Large (67-100%) Small (1-33%) Granulation Quality: N/A Red Red, Friable Necrotic Amount: None Present (0%) None Present (0%) Small (1-33%) Necrotic Tissue: N/A N/A Eschar Exposed Structures: Fascia: No Fascia: No Fascia: No Fat Layer (Subcutaneous Fat Layer (Subcutaneous Fat Layer (Subcutaneous Tissue) Exposed: No Tissue) Exposed: No Tissue) Exposed: No Tendon: No Tendon: No Tendon: No Muscle: No Muscle: No Muscle: No Joint: No Joint: No Joint: No Bone: No Bone: No Bone: No Limited to Skin Limited to Skin Limited to Skin Breakdown Breakdown Breakdown Epithelialization: Large (67-100%) None Medium (34-66%) Periwound Skin Texture: No Abnormalities Noted No Abnormalities Noted No Abnormalities Noted Periwound Skin  Dry/Scaly: Yes No Abnormalities Noted No Abnormalities Noted Moisture: Periwound Skin Color: No Abnormalities Noted No Abnormalities Noted Rubor: Yes Temperature: No Abnormality No Abnormality N/A Tenderness on Yes Yes No Palpation: Wound Preparation: Ulcer Cleansing: Ulcer Cleansing: Ulcer Cleansing: Not Rinsed/Irrigated with Rinsed/Irrigated with Cleansed Saline, Other: soap and Saline, Other: soap and water water Topical Anesthetic Applied: None Topical Anesthetic Topical Anesthetic Applied: Other: lidocaine Applied: Other: lidocaine 4% 4% SHAM, ALVIAR ([id number]) Treatment Notes Electronic Signature(s) Signed: 03/14/2017 11:21:55 AM By: Christin Fudge MD, FACS Entered By: Christin Fudge on  03/14/2017 11:21:55 [name] ([id number]) -------------------------------------------------------------------------------- [place] Details Patient Name: MONE, COMMISSO. Date of Service: 03/14/2017 10:30 AM Medical Record Number: [id number] Patient Account Number: [id number] Date of Birth/Sex: 1951-08-10 (66 y.o. Female) Treating RN: [name] Barman Primary Care Adyan Palau: FITZGERALD, DAVID Other Clinician: Referring Brentlee Delage: FITZGERALD, DAVID Treating Jadie Allington/Extender: Frann Rider in Treatment: 45 Active Inactive ` Orientation to the Wound Care Program Nursing Diagnoses: Knowledge deficit related to the wound healing center program Goals: Patient/caregiver will verbalize understanding of the [facility] Date Initiated: 06/21/2016 Target Resolution Date: 03/29/2017 Goal Status: Active Interventions: Provide education on orientation to the wound center Notes: ` Wound/Skin Impairment Nursing Diagnoses: Impaired tissue integrity Goals: Patient/caregiver will verbalize understanding of skin care regimen Date Initiated: 06/21/2016 Target Resolution Date: 03/29/2017 Goal Status: Active Ulcer/skin breakdown will have a volume reduction of 30% by week 4 Date Initiated: 06/21/2016 Target Resolution Date: 03/29/2017 Goal Status: Active Ulcer/skin breakdown will have a volume reduction of 50% by week 8 Date Initiated: 06/21/2016 Target Resolution Date: 03/29/2017 Goal Status: Active Ulcer/skin breakdown will have a volume reduction of 80% by week 12 Date Initiated: 06/21/2016 Target Resolution Date: 03/29/2017 Goal Status: Active Ulcer/skin breakdown will heal within 14 weeks Date Initiated: 06/21/2016 Target Resolution Date: 03/29/2017 LASEAN, RAHMING ([id number]) Goal Status: Active Interventions: Assess patient/caregiver ability to obtain necessary supplies Assess patient/caregiver ability to perform ulcer/skin care regimen  upon admission and as needed Assess ulceration(s) every visit Provide education on ulcer and skin care Notes: Electronic Signature(s) Signed: 03/17/2017 5:09:44 PM By: Gretta Cool, RN, BSN, Kim RN, BSN Entered By: Gretta Cool, RN, BSN, Kim on 03/17/2017 12:33:08 [name] ([id number]) -------------------------------------------------------------------------------- Pain Assessment Details Patient Name: MACIAH, FEEBACK. Date of Service: 03/14/2017 10:30 AM Medical Record Number: [id number] Patient Account Number: [id number] Date of Birth/Sex: December 01, 1951 (66 y.o. Female) Treating RN: [name] Barman Primary Care Srihitha Tagliaferri: FITZGERALD, DAVID Other Clinician: Referring Loralei Radcliffe: FITZGERALD, DAVID Treating Lytle Malburg/Extender: Frann Rider in Treatment: 28 Active Problems Location of Pain Severity and Description of Pain Patient Has Paino No Site Locations With Dressing Change: No Pain Management and Medication Current Pain Management: Electronic Signature(s) Signed: 03/14/2017 4:59:14 PM By: Gretta Cool, RN, BSN, Kim RN, BSN Entered By: Gretta Cool, RN, BSN, Kim on 03/14/2017 10:40:52 [name] ([id number]) -------------------------------------------------------------------------------- Patient/Caregiver Education Details Patient Name: ZAELYN, NOACK. Date of Service: 03/14/2017 10:30 AM Medical Record Number: [id number] Patient Account Number: [id number] Date of Birth/Gender: 1951/05/20 (66 y.o. Female) Treating RN: [name] Barman Primary Care Physician: FITZGERALD, DAVID Other Clinician: Referring Physician: FITZGERALD, DAVID Treating Physician/Extender: Frann Rider in Treatment: 19 Education Assessment Education Provided To: Patient Education Topics Provided Wound/Skin Impairment: Handouts: Caring for Your Ulcer Methods: Demonstration, Explain/Verbal Responses: State content correctly Electronic Signature(s) Signed: 03/17/2017 5:09:44 PM By: Gretta Cool, RN, BSN, Kim RN,  BSN Entered By: Gretta Cool, RN, BSN, Kim on 03/17/2017 12:34:55 [name] ([id number]) -------------------------------------------------------------------------------- Wound Assessment Details Patient Name: AMAYIAH, GOSNELL. Date of  Service: 03/14/2017 10:30 AM Medical Record Number: [id number] Patient Account Number: [id number] Date of Birth/Sex: January 31, 1951 (66 y.o. Female) Treating RN: [name] Barman Primary Care Tykisha Areola: FITZGERALD, DAVID Other Clinician: Referring Daegen Berrocal: FITZGERALD, DAVID Treating Shamariah Shewmake/Extender: Frann Rider in Treatment: 38 Wound Status Wound Number: 1 Primary Etiology: Lymphedema Wound Location: Left Lower Leg - Posterior Wound Status: Open Wounding Event: Gradually Appeared Date Acquired: 12/31/2015 Weeks Of Treatment: 38 Clustered Wound: No Photos Wound Measurements Length: (cm) 0.2 Width: (cm) 0.3 Depth: (cm) 0.1 Area: (cm) 0.047 Volume: (cm) 0.005 % Reduction in Area: 100% % Reduction in Volume: 100% Epithelialization: Large (67-100%) Tunneling: No Wound Description Classification: Partial Thickness Wound Margin: Flat and Intact Exudate Amount: Large Exudate Type: Serous Exudate Color: amber Foul Odor After Cleansing: Yes Due to Product Use: No Slough/Fibrino Yes Wound Bed Granulation Amount: None Present (0%) Exposed Structure Necrotic Amount: Small (1-33%) Fascia Exposed: No Necrotic Quality: Eschar Fat Layer (Subcutaneous Tissue) Exposed: No Tendon Exposed: No Muscle Exposed: No Joint Exposed: No Bone Exposed: No FLEETA, KUNDE ([id number]) Limited to Skin Breakdown Periwound Skin Texture Texture Color No Abnormalities Noted: No No Abnormalities Noted: No Callus: No Atrophie Blanche: No Crepitus: No Cyanosis: No Excoriation: No Ecchymosis: Yes Induration: No Erythema: No Rash: No Hemosiderin Staining: No Scarring: No Mottled: No Pallor: No Moisture Rubor: Yes No Abnormalities Noted: No Dry /  Scaly: No Temperature / Pain Maceration: No Temperature: No Abnormality Tenderness on Palpation: Yes Wound Preparation Ulcer Cleansing: Other: soap and water, Topical Anesthetic Applied: None Electronic Signature(s) Signed: 03/14/2017 4:59:14 PM By: Gretta Cool, RN, BSN, Kim RN, BSN Entered By: Gretta Cool, RN, BSN, Kim on 03/14/2017 10:59:23 [name] ([id number]) -------------------------------------------------------------------------------- Wound Assessment Details Patient Name: SHAWNEE, GAMBONE. Date of Service: 03/14/2017 10:30 AM Medical Record Number: [id number] Patient Account Number: [id number] Date of Birth/Sex: 03-20-1951 (66 y.o. Female) Treating RN: [name] Barman Primary Care Derrik Mceachern: FITZGERALD, DAVID Other Clinician: Referring Ryelan Kazee: FITZGERALD, DAVID Treating Davan Hark/Extender: Frann Rider in Treatment: 38 Wound Status Wound Number: 2 Primary Etiology: Lymphedema Wound Location: Right Lower Leg - Posterior Wound Status: Open Wounding Event: Gradually Appeared Date Acquired: 12/31/2015 Weeks Of Treatment: 38 Clustered Wound: Yes Photos Wound Measurements Length: (cm) 0.2 Width: (cm) 0.2 Depth: (cm) 0.1 Clustered Quantity: 3 Area: (cm) 0.031 Volume: (cm) 0.003 % Reduction in Area: 100% % Reduction in Volume: 100% Epithelialization: Large (67-100%) Tunneling: No Undermining: No Wound Description Classification: Partial Thickness Wound Margin: Indistinct, nonvisible Exudate Amount: None Present Foul Odor After Cleansing: Yes Due to Product Use: No Slough/Fibrino Yes Wound Bed Granulation Amount: None Present (0%) Exposed Structure Necrotic Amount: Small (1-33%) Fascia Exposed: No Necrotic Quality: Eschar Fat Layer (Subcutaneous Tissue) Exposed: No Tendon Exposed: No Muscle Exposed: No Joint Exposed: No Bone Exposed: No Limited to Skin Breakdown MURIEL, HANNOLD ([id number]) Periwound Skin Texture Texture Color No Abnormalities  Noted: No No Abnormalities Noted: No Callus: No Atrophie Blanche: No Crepitus: No Cyanosis: No Excoriation: No Ecchymosis: No Induration: No Erythema: No Rash: No Hemosiderin Staining: No Scarring: No Mottled: No Pallor: No Moisture Rubor: No No Abnormalities Noted: No Dry / Scaly: Yes Temperature / Pain Maceration: No Temperature: No Abnormality Tenderness on Palpation: Yes Wound Preparation Ulcer Cleansing: Other: soap and water, Topical Anesthetic Applied: Other: lidocaine 4%, Electronic Signature(s) Signed: 03/14/2017 4:59:14 PM By: Gretta Cool, RN, BSN, Kim RN, BSN Entered By: Gretta Cool, RN, BSN, Kim on 03/14/2017 11:00:32 [name] ([id number]) -------------------------------------------------------------------------------- Wound Assessment Details Patient Name: JEANETTE, RAUTH. Date of Service: 03/14/2017 10:30 AM Medical  Record Number: [id number] Patient Account Number: [id number] Date of Birth/Sex: [birth year] (66 y.o. Female) Treating RN: [name] Barman Primary Care Stephene Alegria: FITZGERALD, DAVID Other Clinician: Referring Elyse Prevo: FITZGERALD, DAVID Treating Jessamine Barcia/Extender: Frann Rider in Treatment: 38 Wound Status Wound Number: 3 Primary Etiology: Pressure Ulcer Wound Location: Right Calcaneus Wound Status: Open Wounding Event: Pressure Injury Date Acquired: 01/24/2017 Weeks Of Treatment: 5 Clustered Wound: No Photos Wound Measurements Length: (cm) 0.1 Width: (cm) 0.1 Depth: (cm) 0.1 Area: (cm) 0.008 Volume: (cm) 0.001 % Reduction in Area: 99.9% % Reduction in Volume: 99.9% Epithelialization: Large (67-100%) Tunneling: No Undermining: No Wound Description Classification: Category/Stage II Wound Margin: Distinct, outline attached Exudate Amount: Large Exudate Type: Serosanguineous Exudate Color: red, brown Foul Odor After Cleansing: No Slough/Fibrino No Wound Bed Granulation Amount: None Present (0%) Exposed Structure Necrotic  Amount: None Present (0%) Fascia Exposed: No Fat Layer (Subcutaneous Tissue) Exposed: No Periwound Skin Texture Texture Color No Abnormalities Noted: No No Abnormalities Noted: No Moisture Temperature / Pain Baldus, Kenyada M. ([id number]) No Abnormalities Noted: No Temperature: No Abnormality Maceration: Yes Tenderness on Palpation: Yes Wound Preparation Ulcer Cleansing: Rinsed/Irrigated with Saline Topical Anesthetic Applied: None Electronic Signature(s) Signed: 03/14/2017 4:59:14 PM By: Gretta Cool, RN, BSN, Kim RN, BSN Entered By: Gretta Cool, RN, BSN, Kim on 03/14/2017 11:01:27 [name] ([id number]) -------------------------------------------------------------------------------- Wound Assessment Details Patient Name: BERTINA, GUTHRIDGE. Date of Service: 03/14/2017 10:30 AM Medical Record Number: [id number] Patient Account Number: [id number] Date of Birth/Sex: 11-21-1951 (66 y.o. Female) Treating RN: [name] Barman Primary Care Kendyl Festa: FITZGERALD, DAVID Other Clinician: Referring Carmine Youngberg: FITZGERALD, DAVID Treating Marigold Mom/Extender: Frann Rider in Treatment: 38 Wound Status Wound Number: 4 Primary Etiology: Trauma, Other Wound Location: Left Lower Leg - Anterior Wound Status: Open Wounding Event: Trauma Date Acquired: 02/24/2017 Weeks Of Treatment: 2 Clustered Wound: No Photos Wound Measurements Length: (cm) 0.3 Width: (cm) 0.4 Depth: (cm) 0.1 Area: (cm) 0.094 Volume: (cm) 0.009 % Reduction in Area: 82.9% % Reduction in Volume: 83.6% Epithelialization: Large (67-100%) Tunneling: No Undermining: No Wound Description Classification: Partial Thickness Foul Odor Aft Wound Margin: Distinct, outline attached Slough/Fibrin Exudate Amount: Large Exudate Type: Serosanguineous Exudate Color: red, brown er Cleansing: No o No Wound Bed Granulation Amount: None Present (0%) Exposed Structure Necrotic Amount: None Present (0%) Fascia Exposed: No Fat Layer  (Subcutaneous Tissue) Exposed: No Tendon Exposed: No Muscle Exposed: No Joint Exposed: No Bone Exposed: No BIRDELL, FRASIER ([id number]) Limited to Skin Breakdown Periwound Skin Texture Texture Color No Abnormalities Noted: No No Abnormalities Noted: No Moisture Temperature / Pain No Abnormalities Noted: No Temperature: No Abnormality Dry / Scaly: Yes Tenderness on Palpation: Yes Wound Preparation Ulcer Cleansing: Rinsed/Irrigated with Saline, Other: soap and water, Topical Anesthetic Applied: Other: lidocaine 4%, Electronic Signature(s) Signed: 03/14/2017 4:59:14 PM By: Gretta Cool, RN, BSN, Kim RN, BSN Entered By: Gretta Cool, RN, BSN, Kim on 03/14/2017 11:02:12 ALLAYAH, RAINERI ([id number]) -------------------------------------------------------------------------------- Wound Assessment Details Patient Name: JAYA, LAPKA. Date of Service: 03/14/2017 10:30 AM Medical Record Number: [id number] Patient Account Number: [id number] Date of Birth/Sex: 24-Jun-1951 (66 y.o. Female) Treating RN: [name] Barman Primary Care Sayvon Arterberry: FITZGERALD, DAVID Other Clinician: Referring Ronny Korff: FITZGERALD, DAVID Treating Skylan Lara/Extender: Frann Rider in Treatment: 38 Wound Status Wound Number: 5 Primary Etiology: Trauma, Other Wound Location: Right Lower Leg - Anterior Wound Status: Open Wounding Event: Trauma Date Acquired: 03/07/2017 Weeks Of Treatment: 1 Clustered Wound: No Photos Wound Measurements Length: (cm) 0.1 Width: (cm) 0.1 Depth: (cm) 0.1 Area: (cm) 0.008 Volume: (cm) 0.001 % Reduction in Area: 98.4% %  Reduction in Volume: 98% Epithelialization: None Tunneling: No Undermining: No Wound Description Classification: Partial Thickness Foul Odor Aft Wound Margin: Distinct, outline attached Slough/Fibrin Exudate Amount: Medium Exudate Type: Serosanguineous Exudate Color: red, brown er Cleansing: No o No Wound Bed Granulation Amount: Large (67-100%) Exposed  Structure Granulation Quality: Red Fascia Exposed: No Necrotic Amount: None Present (0%) Fat Layer (Subcutaneous Tissue) Exposed: No Tendon Exposed: No Muscle Exposed: No Joint Exposed: No Bone Exposed: No DOTTI, BUSEY ([id number]) Limited to Skin Breakdown Periwound Skin Texture Texture Color No Abnormalities Noted: No No Abnormalities Noted: No Moisture Temperature / Pain No Abnormalities Noted: No Temperature: No Abnormality Tenderness on Palpation: Yes Wound Preparation Ulcer Cleansing: Rinsed/Irrigated with Saline, Other: soap and water, Topical Anesthetic Applied: Other: lidocaine 4%, Electronic Signature(s) Signed: 03/14/2017 4:59:14 PM By: Gretta Cool, RN, BSN, Kim RN, BSN Entered By: Gretta Cool, RN, BSN, Kim on 03/14/2017 11:03:04 [name] ([id number]) -------------------------------------------------------------------------------- Wound Assessment Details Patient Name: MARKITA, STCHARLES. Date of Service: 03/14/2017 10:30 AM Medical Record Number: [id number] Patient Account Number: [id number] Date of Birth/Sex: Jul 01, 1951 (66 y.o. Female) Treating RN: [name] Barman Primary Care Amirra Herling: FITZGERALD, DAVID Other Clinician: Referring Sebastyan Snodgrass: FITZGERALD, DAVID Treating Rosela Supak/Extender: Frann Rider in Treatment: 38 Wound Status Wound Number: 6 Primary Etiology: Pressure Ulcer Wound Location: Left Toe Great Wound Status: Open Wounding Event: Gradually Appeared Date Acquired: 02/28/2017 Weeks Of Treatment: 0 Clustered Wound: No Photos Wound Measurements Length: (cm) 1 Width: (cm) 2 Depth: (cm) 0.1 Area: (cm) 1.571 Volume: (cm) 0.157 % Reduction in Area: 0% % Reduction in Volume: 0% Epithelialization: Medium (34-66%) Tunneling: No Undermining: No Wound Description Classification: Category/Stage II Wound Margin: Indistinct, nonvisible Exudate Amount: Small Exudate Type: Serous Exudate Color: amber Foul Odor After Cleansing:  No Slough/Fibrino No Wound Bed Granulation Amount: Small (1-33%) Exposed Structure Granulation Quality: Red, Friable Fascia Exposed: No Necrotic Amount: Small (1-33%) Fat Layer (Subcutaneous Tissue) Exposed: No Necrotic Quality: Eschar Tendon Exposed: No Muscle Exposed: No Joint Exposed: No Bone Exposed: No KOLINA, KUBE. ([id number]) Limited to Skin Breakdown Periwound Skin Texture Texture Color No Abnormalities Noted: No No Abnormalities Noted: No Rubor: Yes Moisture No Abnormalities Noted: No Wound Preparation Ulcer Cleansing: Not Cleansed Topical Anesthetic Applied: None Electronic Signature(s) Signed: 03/14/2017 4:59:14 PM By: Gretta Cool, RN, BSN, Kim RN, BSN Entered By: Gretta Cool, RN, BSN, Kim on 03/14/2017 11:05:24 [name] ([id number]) -------------------------------------------------------------------------------- [place] Details Patient Name: NICKAYLA, MCINNIS. Date of Service: 03/14/2017 10:30 AM Medical Record Number: [id number] Patient Account Number: [id number] Date of Birth/Sex: 06-Jan-1951 (66 y.o. Female) Treating RN: [name] Barman Primary Care Shannia Jacuinde: FITZGERALD, DAVID Other Clinician: Referring Jahseh Lucchese: FITZGERALD, DAVID Treating Chondra Boyde/Extender: Frann Rider in Treatment: 19 Vital Signs Time Taken: 10:40 Temperature (F): 98.0 Height (in): 67 Pulse (bpm): 84 Weight (lbs): 300 Respiratory Rate (breaths/min): 18 Body Mass Index (BMI): 47 Blood Pressure (mmHg): 195/58 Reference Range: 80 - 120 mg / dl Electronic Signature(s) Signed: 03/14/2017 4:59:14 PM By: Gretta Cool, RN, BSN, Kim RN, BSN Entered By: Gretta Cool, RN, BSN, Kim on 03/14/2017 10:41:41

## 2017-03-15 NOTE — Progress Notes (Addendum)
Brooke Hopkins ([id number]) Visit Report for 03/14/2017 Chief Complaint Document Details Patient Name: Brooke Hopkins, Brooke Hopkins. Date of Service: 03/14/2017 10:30 AM Medical Record Number: [id number] Patient Account Number: [id number] Date of Birth/Sex: 11-09-1951 (66 y.o. Female) Treating RN: [name] Barman Primary Care Provider: FITZGERALD, DAVID Other Clinician: Referring Provider: FITZGERALD, DAVID Treating Provider/Extender: Frann Rider in Treatment: 64 Information Obtained from: Patient Chief Complaint Brooke Hopkins returns for follow-up to her bilateral lower extremity ulcers Electronic Signature(s) Signed: 03/14/2017 11:22:20 AM By: Christin Fudge MD, FACS Entered By: Christin Fudge on 03/14/2017 11:22:19 Brooke Hopkins ([id number]) -------------------------------------------------------------------------------- HPI Details Patient Name: Brooke Hopkins, Brooke Hopkins. Date of Service: 03/14/2017 10:30 AM Medical Record Number: [id number] Patient Account Number: [id number] Date of Birth/Sex: 1951-02-08 (66 y.o. Female) Treating RN: [name] Barman Primary Care Provider: FITZGERALD, DAVID Other Clinician: Referring Provider: FITZGERALD, DAVID Treating Provider/Extender: Frann Rider in Treatment: 64 History of Present Illness Location: massive swelling of both lower extremities and ulceration both lower extremities Quality: Patient reports experiencing a dull pain to affected area(s). Severity: Patient states wound are getting worse. Duration: Patient has had the wound for >2 years prior to seeking treatment at the wound center Timing: Pain in wound is constant (hurts all the time) Context: The wound appeared gradually over time Modifying Factors: Other treatment(s) tried include:she has a lymphedema pump but uses it seldom and she's had several course of antibiotics Associated Signs and Symptoms: Patient reports having difficulty standing for long periods. HPI Description:  66 year old patient seen by Dr. Ola Spurr of infectious disease who has been following up for left lower extremity cellulitis and ulcer with recurrent bilateral lower extremity problems for several months. Recently she had a large right lower extremity bullae which opened out and has been ulcerated. She has seen the vascular group and has been getting Unna's wraps and has a lymphedema pump used in the past. Her prior cultures were positive for Pseudomonas, Proteus and was treated with amoxicillin. He has also been treated with 2 weeks course of ciprofloxacin and amoxicillin.. Increase of Lasix dose helped with the edema and echo showed no systolic CHF but may be diastolic problems. Past medical history significant for diabetes mellitus type 2, venous stasis ulcer, obesity, diabetic peripheral neuropathy, status post back surgery, cholecystectomy, hysterectomy, arthroscopy of the knee. He is a former smoker and quit smoking in 1984. The patient has seen Dr. Delana Meyer who did not recommend any arterial or venous duplex studies and has been using Unna's wraps and also recommended a lymphedema pump. she has been very noncompliant with using these. 06/28/2016 -- the patient is still on antibiotics as prescribed by Dr. Ola Spurr and he is asked her to take it for 3 weeks. The patient also says she has a lot of redness and pain in the folds of her thigh and lower extremity and this is very painful. 07/26/2016 -- the patient is off antibiotics and has been getting dressing changes 3 times a week. 08/09/2016 -- is been on Cipro and is taking potassium supplements along with her Lasix and is going to be seeing Dr. Ola Spurr for a consult return visit only in November 2017. 08/23/2016 -- she was admitted to the hospital last week and I have reviewed these reports in detail. She was seen by Dr. Ola Spurr who noted a Pseudomonas infection which was resistant to ciprofloxacin and discharged her on  Ceftazidime 1 g IV every 12 hourly anyone days. The antibiotic was to be stopped on September 11 and he would see her  back in the clinic. 08/30/2016 -- had a communication from Dr. Ola Spurr that he would extend her antibiotics by a week if she continued to look like cellulitis was persisting. Brooke Hopkins, Brooke Hopkins ([id number]) 09/13/2016 -- the PICC line is out and antibiotics have stopped. 10/04/16: returns today for f/u. reports utilizes compression pump twice daily. she is compliant with her compression therapy. she reports a new blister on the right proximal posterior calf. no systemic s/s of infection. 10/11/16: returns today for f/u. she reports adherence to her compression pumps, but there is no improvement regarding her BLE edema. there is weeping of fluid. denies fever, chills, body aches or malaise. 10/18/2016 -- she has a significant cellulitis of her right lower extremity which was not there last week. I believe at this stage she will need the expertise of Dr. Ola Spurr to decide on antibiotic coverage. 10/25/2016 -- the patient has had cipro and ampicillin been started by Dr. Ola Spurr and he is awaiting further cultures. Overall she's been feeling better. 11/08/2016 -- Dr. Ola Spurr this week who has continued with ciprofloxacin and amoxicillin and is awaiting further cultures before deciding to place her on a PICC line. 11/15/2016 -- Dr. Blane Ohara note was reviewed and her cultures growing Pseudomonas in both legs which is sensitive to Cipro and the other is resistant to Cipro and she would get a PICC line and start IV antibiotics -- Ceftazidime 1 g q 12 for 3 weeks. 11-29-16 Brooke Hopkins presents today in follow-up of her bilateral lower exterminators ulcers. She remains on IV antibiotic therapy via a PICC line per Dr. Ola Spurr; she has a follow-up appointment with him on Monday, 12/02/2016. She anticipates that the about therapy will be discontinued at that appointment.  She denies any complications of nausea vomiting and/or diarrhea accompanied by this antibiotic therapy. She admits to using her lymphedema pumps twice daily with the exception of yesterday. She denies any concerns or complications with the current treatment plan. 12/06/2016 -- Dr. Ola Spurr saw her recently on 12/02/2016 and due to the fact she has had significant problems with recurrent ulceration and Pseudomonas infection he recommended 3 more weeks of antibiotics and extended it until December 25. The PICC line will be in place. Last hemoglobin A1c on November 30 was 9% 12/27/2016 -- because of the holidays the patient's diet has been higher in salt, her dressings have not been done as required and she is not used to lymphedema pumps appropriately. This has led to her lymphedema increasing markedly. 01/24/2017 -- the patient's hospital bed is malfunctioning and hence her legs have been flat in bed and her lymphedema is increased markedly. During my examination I also noted a unstageable pressure injury to her right heel 02/07/2017 -- she has returned after 2 weeks as she was done with influenza and has been in bed a lot, with a new bed which has helped her reduce the edema due to proper elevation. Will she has a full-fledged large pressure injury to the right heel. 02/28/2017 -- hilar dressing was being taken down with scissors she had a sharp injury to the left medial calf area which is fairly superficial. Electronic Signature(s) Signed: 03/14/2017 11:22:36 AM By: Christin Fudge MD, FACS Entered By: Christin Fudge on 03/14/2017 11:22:35 Brooke Hopkins ([id number]) -------------------------------------------------------------------------------- Physical Exam Details Patient Name: Brooke Hopkins, Brooke Hopkins. Date of Service: 03/14/2017 10:30 AM Medical Record Number: [id number] Patient Account Number: [id number] Date of Birth/Sex: 1951/04/20 (66 y.o. Female) Treating RN: [name] Barman Primary  Care Provider: FITZGERALD, DAVID Other  Clinician: Referring Provider: FITZGERALD, DAVID Treating Provider/Extender: Frann Rider in Treatment: 38 Constitutional . Pulse regular. Respirations normal and unlabored. Afebrile. . Eyes Nonicteric. Reactive to light. Ears, Nose, Mouth, and Throat Lips, teeth, and gums WNL.Marland Kitchen Moist mucosa without lesions. Neck supple and nontender. No palpable supraclavicular or cervical adenopathy. Normal sized without goiter. Respiratory WNL. No retractions.. Cardiovascular Pedal Pulses WNL. No clubbing, cyanosis or edema. Chest Breasts symmetical and no nipple discharge.. Breast tissue WNL, no masses, lumps, or tenderness.. Gastrointestinal (GI) Abdomen without masses or tenderness.. No liver or spleen enlargement or tenderness.. Lymphatic No adneopathy. No adenopathy. No adenopathy. Musculoskeletal Adexa without tenderness or enlargement.. Digits and nails w/o clubbing, cyanosis, infection, petechiae, ischemia, or inflammatory conditions.. Integumentary (Hair, Skin) No suspicious lesions. No crepitus or fluctuance. No peri-wound warmth or erythema. No masses.Marland Kitchen Psychiatric Judgement and insight Intact.. No evidence of depression, anxiety, or agitation.. Notes both lower extremities are looking much better and the wounds are clean. The lymphedema has gone down significantly. The right heel did not need any debridement today and there is healthy granulation tissue. The left big toe has some superficial abrasions on the dorsum and we will keep this under observation. Electronic Signature(s) Signed: 03/14/2017 11:23:41 AM By: Christin Fudge MD, FACS Entered By: Christin Fudge on 03/14/2017 11:23:40 Brooke Hopkins, Brooke Hopkins ([id number], Brooke Hopkins ([id number]) -------------------------------------------------------------------------------- Physician Orders Details Patient Name: Brooke Hopkins, Brooke Hopkins. Date of Service: 03/14/2017 10:30 AM Medical  Record Number: [id number] Patient Account Number: [id number] Date of Birth/Sex: [birth year] (66 y.o. 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 - clinic use o Cleanse wound with mild soap and water - HHRN please scrub wounds with mild soap and water o May Shower, gently pat wound dry prior to applying new dressing. o May shower with protection. Wound #2 Right,Posterior Lower Leg o Clean wound with Normal Saline. - clinic use o Cleanse wound with mild soap and water - HHRN please scrub wounds with mild soap and water o May Shower, gently pat wound dry prior to applying new dressing. o May shower with protection. Wound #3 Right Calcaneus o Clean wound with Normal Saline. - clinic use o Cleanse wound with mild soap and water - HHRN please scrub wounds with mild soap and water o May Shower, gently pat wound dry prior to applying new dressing. o May shower with protection. Wound #4 Left,Anterior Lower Leg o Clean wound with Normal Saline. - clinic use o Cleanse wound with mild soap and water - HHRN please scrub wounds with mild soap and water o May Shower, gently pat wound dry prior to applying new dressing. o May shower with protection. Wound #5 Right,Anterior Lower Leg o Clean wound with Normal Saline. - clinic  use Brooke Hopkins, Brooke Hopkins. ([id number]) o Cleanse wound with mild soap and water - HHRN please scrub wounds with mild soap and water o May Shower, gently pat wound dry prior to applying new dressing. o May shower with protection. Wound #6  Left Toe Great o Clean wound with Normal Saline. - clinic use o Cleanse wound with mild soap and water - HHRN please scrub wounds with mild soap and water o May Shower, gently pat wound dry prior to applying new dressing. o May shower with protection. Anesthetic Wound #1 Left,Posterior Lower Leg o Topical Lidocaine 4% cream applied to wound bed prior to debridement - clinic use Wound #2 Right,Posterior Lower Leg o Topical Lidocaine 4% cream applied to wound bed prior to debridement - clinic use Wound #3 Right Calcaneus o Topical Lidocaine 4% cream applied to wound bed prior to debridement - clinic use Wound #4 Left,Anterior Lower Leg o Topical Lidocaine 4% cream applied to wound bed prior to debridement - clinic use Wound #5 Right,Anterior Lower Leg o Topical Lidocaine 4% cream applied to wound bed prior to debridement - clinic use Wound #6 Left Toe Great o Topical Lidocaine 4% cream applied to wound bed prior to debridement - clinic use Skin Barriers/Peri-Wound Care Wound #1 Left,Posterior Lower Leg o Moisturizing lotion - PLEASE PUT LOTION ON THE PTS LEGS (NOT ON WOUNDS) Wound #2 Right,Posterior Lower Leg o Moisturizing lotion - PLEASE PUT LOTION ON THE PTS LEGS (NOT ON WOUNDS) Wound #3 Right Calcaneus o Moisturizing lotion - PLEASE PUT LOTION ON THE PTS LEGS (NOT ON WOUNDS) Wound #4 Left,Anterior Lower Leg o Moisturizing lotion - PLEASE PUT LOTION ON THE PTS LEGS (NOT ON WOUNDS) Wound #5 Right,Anterior Lower Leg o Moisturizing lotion - PLEASE PUT LOTION ON THE PTS LEGS (NOT ON WOUNDS) Wound #6 Left Toe Great o Moisturizing lotion - PLEASE PUT LOTION ON THE PTS LEGS (NOT ON WOUNDS) Brooke Hopkins, Brooke Hopkins  ([id number]) Primary Wound Dressing Wound #1 Left,Posterior Lower Leg o Prisma Ag - moisten with saline Wound #2 Right,Posterior Lower Leg o Prisma Ag - moisten with saline Wound #3 Right Calcaneus o Prisma Ag - moisten with saline Wound #4 Left,Anterior Lower Leg o Prisma Ag - moisten with saline Wound #5 Right,Anterior Lower Leg o Prisma Ag - moisten with saline Wound #6 Left Toe Great o Aquacel Ag Secondary Dressing Wound #1 Left,Posterior Lower Leg o ABD pad Wound #2 Right,Posterior Lower Leg o ABD pad Wound #3 Right Calcaneus o ABD pad Wound #4 Left,Anterior Lower Leg o ABD pad Wound #5 Right,Anterior Lower Leg o ABD pad Wound #6 Left Toe Great o Conform/Kerlix Dressing Change Frequency Wound #1 Left,Posterior Lower Leg o Change Dressing Monday, Wednesday, Friday - HHRN to change wraps Monday and Wednesday Wound #2 Right,Posterior Lower Leg o Change Dressing Monday, Wednesday, Friday - HHRN to change wraps Monday and Wednesday Wound #3 Right Calcaneus o Change Dressing Monday, Wednesday, Friday - HHRN to change wraps Monday and Wednesday Wound #4 Left,Anterior Lower Leg EMONII, WIENKE ([id number]) o Change Dressing Monday, Wednesday, Friday - HHRN to change wraps Monday and Wednesday Wound #5 Right,Anterior Lower Leg o Change Dressing Monday, Wednesday, Friday - HHRN to change wraps Monday and Wednesday Wound #6 Left Toe Great o Change Dressing Monday, Wednesday, Friday - HHRN to change wraps Monday and Wednesday Follow-up Appointments Wound #1 Left,Posterior Lower Leg o Return Appointment in 1 week. Wound #2 Right,Posterior Lower Leg o Return Appointment in 1 week. Wound #3 Right Calcaneus o Return Appointment in 1 week. Wound #4 Left,Anterior Lower Leg o Return Appointment in 1 week. Wound #5 Right,Anterior Lower Leg o Return Appointment in 1 week. Wound #6 Left Toe Great o Return Appointment in 1  week. Edema Control Wound #1 Left,Posterior Lower  Leg o 3 Layer Compression System - Bilateral - NO UNNA o Elevate legs to the level of the heart and pump ankles as often as possible o Compression Pump: Use compression pump on left lower extremity for 30 minutes, twice daily. o Compression Pump: Use compression pump on right lower extremity for 30 minutes, twice daily. o Support Garment 20-30 mm/Hg pressure to: - HHRN TO ORDER COMPRESSION STOCKINGS- JUXTALITE VELCRO COMPRESSION WRAPS MEASUREMENTS ARE- RIGHT ANKLE- 27CM RIGHT CALF-46CM RIGHT HEEL TO KNEE-47.8CM LEFT ANKLE-27CM LEFT CALF-48CM LEFT HEEL TO KNEE-47.8CM Wound #2 Right,Posterior Lower Leg o 3 Layer Compression System - Bilateral - NO UNNA o Elevate legs to the level of the heart and pump ankles as often as possible Brooke Hopkins, Brooke Hopkins. ([id number]) o Compression Pump: Use compression pump on left lower extremity for 30 minutes, twice daily. o Compression Pump: Use compression pump on right lower extremity for 30 minutes, twice daily. o Support Garment 20-30 mm/Hg pressure to: - HHRN TO ORDER COMPRESSION STOCKINGS- JUXTALITE VELCRO COMPRESSION WRAPS MEASUREMENTS ARE- RIGHT ANKLE- 27CM RIGHT CALF-46CM RIGHT HEEL TO KNEE-47.8CM LEFT ANKLE-27CM LEFT CALF-48CM LEFT HEEL TO KNEE-47.8CM Wound #3 Right Calcaneus o 3 Layer Compression System - Bilateral - NO UNNA o Elevate legs to the level of the heart and pump ankles as often as possible o Compression Pump: Use compression pump on left lower extremity for 30 minutes, twice daily. o Compression Pump: Use compression pump on right lower extremity for 30 minutes, twice daily. o Support Garment 20-30 mm/Hg pressure to: - HHRN TO ORDER COMPRESSION STOCKINGS- JUXTALITE VELCRO COMPRESSION WRAPS MEASUREMENTS ARE- RIGHT ANKLE- 27CM RIGHT CALF-46CM RIGHT HEEL TO KNEE-47.8CM LEFT ANKLE-27CM LEFT CALF-48CM LEFT HEEL TO KNEE-47.8CM Wound #4  Left,Anterior Lower Leg o 3 Layer Compression System - Bilateral - NO UNNA o Elevate legs to the level of the heart and pump ankles as often as possible o Compression Pump: Use compression pump on left lower extremity for 30 minutes, twice daily. o Compression Pump: Use compression pump on right lower extremity for 30 minutes, twice daily. o Support Garment 20-30 mm/Hg pressure to: - HHRN TO ORDER COMPRESSION STOCKINGS- JUXTALITE VELCRO COMPRESSION WRAPS MEASUREMENTS ARE- RIGHT ANKLE- 27CM RIGHT CALF-46CM RIGHT HEEL TO KNEE-47.8CM LEFT ANKLE-27CM LEFT CALF-48CM LEFT HEEL TO KNEE-47.8CM Wound #5 Right,Anterior Lower Leg o 3 Layer Compression System - Bilateral - NO Brooke Hopkins, Brooke Hopkins. ([id number]) o Elevate legs to the level of the heart and pump ankles as often as possible o Compression Pump: Use compression pump on left lower extremity for 30 minutes, twice daily. o Compression Pump: Use compression pump on right lower extremity for 30 minutes, twice daily. o Support Garment 20-30 mm/Hg pressure to: - HHRN TO ORDER COMPRESSION STOCKINGS- JUXTALITE VELCRO COMPRESSION WRAPS MEASUREMENTS ARE- RIGHT ANKLE- 27CM RIGHT CALF-46CM RIGHT HEEL TO KNEE-47.8CM LEFT ANKLE-27CM LEFT CALF-48CM LEFT HEEL TO KNEE-47.8CM Wound #6 Left Toe Great o 3 Layer Compression System - Bilateral - NO UNNA o Elevate legs to the level of the heart and pump ankles as often as possible o Compression Pump: Use compression pump on left lower extremity for 30 minutes, twice daily. o Compression Pump: Use compression pump on right lower extremity for 30 minutes, twice daily. o Support Garment 20-30 mm/Hg pressure to: - HHRN TO ORDER COMPRESSION STOCKINGS- JUXTALITE VELCRO COMPRESSION WRAPS MEASUREMENTS ARE- RIGHT ANKLE- 27CM RIGHT CALF-46CM RIGHT HEEL TO KNEE-47.8CM LEFT ANKLE-27CM LEFT CALF-48CM LEFT HEEL TO KNEE-47.8CM Additional Orders / Instructions Wound #1  Left,Posterior Lower Leg o Increase protein intake.  Wound #2 Right,Posterior Lower Leg o Increase protein intake. Wound #3 Right Calcaneus o Increase protein intake. Wound #4 Left,Anterior Lower Leg o Increase protein intake. Wound #5 Right,Anterior Lower Leg o Increase protein intake. Wound #6 Left [address] [place]. ([id number]) o Increase protein intake. Home Health Wound #1 [place] Visits - [place] Nurse may visit PRN to address patientos wound care needs. o FACE TO FACE ENCOUNTER: MEDICARE and MEDICAID PATIENTS: I certify that this patient is under my care and that I had a face-to-face encounter that meets the physician face-to-face encounter requirements with this patient on this date. The encounter with the patient was in whole or in part for the following MEDICAL CONDITION: (primary reason for [place]) MEDICAL NECESSITY: I certify, that based on my findings, NURSING services are a medically necessary home health service. HOME BOUND STATUS: I certify that my clinical findings support that this patient is homebound (i.e., Due to illness or injury, pt requires aid of supportive devices such as crutches, cane, wheelchairs, walkers, the use of special transportation or the assistance of another person to leave their place of residence. There is a normal inability to leave the home and doing so requires considerable and taxing effort. Other absences are for medical reasons / religious services and are infrequent or of short duration when for other reasons). o Please direct any NON-WOUND related issues/requests for orders to patient's Primary Care Physician Wound #2 [place] Visits - [place] Nurse may visit PRN to address patientos wound care needs. o FACE TO FACE ENCOUNTER: MEDICARE and MEDICAID PATIENTS: I certify that this  patient is under my care and that I had a face-to-face encounter that meets the physician face-to-face encounter requirements with this patient on this date. The encounter with the patient was in whole or in part for the following MEDICAL CONDITION: (primary reason for [place]) MEDICAL NECESSITY: I certify, that based on my findings, NURSING services are a medically necessary home health service. HOME BOUND STATUS: I certify that my clinical findings support that this patient is homebound (i.e., Due to illness or injury, pt requires aid of supportive devices such as crutches, cane, wheelchairs, walkers, the use of special transportation or the assistance of another person to leave their place of residence. There is a normal inability to leave the home and doing so requires considerable and taxing effort. Other absences are for medical reasons / religious services and are infrequent or of short duration when for other reasons). o Please direct any NON-WOUND related issues/requests for orders to patient's Primary Care Physician Wound #3 Right Calcaneus o [place] Visits - [place] Nurse may visit PRN to address patientos wound care needs. o FACE TO FACE ENCOUNTER: MEDICARE and MEDICAID PATIENTS: I certify that this patient is under my care and that I had a face-to-face encounter that meets the physician face-to-face encounter requirements with this patient on this date. The encounter with the patient was in whole or in part for the following MEDICAL CONDITION: (primary reason for [place]) MEDICAL NECESSITY: I certify, that based on my findings, NURSING services are a medically necessary home health service. HOME BOUND STATUS: I certify that my clinical findings support that this patient is homebound (i.e., Due to illness or injury, pt requires aid of supportive devices such as crutches, cane, wheelchairs, walkers, the use of special RAISHA, BRABEND[place]. ([id number])  transportation or the assistance of another person to leave their place of residence. There is a normal inability to leave the home and doing so requires considerable and taxing effort. Other absences are for medical reasons / religious services and are infrequent or of short duration when for other reasons). o Please direct any NON-WOUND related issues/requests for orders to patient's Primary Care Physician Wound #4 [place] Visits - [place] Nurse may visit PRN to address patientos wound care needs. o FACE TO FACE ENCOUNTER: MEDICARE and MEDICAID PATIENTS: I certify that this patient is under my care and that I had a face-to-face encounter that meets the physician face-to-face encounter requirements with this patient on this date. The encounter with the patient was in whole or in part for the following MEDICAL CONDITION: (primary reason for [place]) MEDICAL NECESSITY: I certify, that based on my findings, NURSING services are a medically necessary home health service. HOME BOUND STATUS: I certify that my clinical findings support that this patient is homebound (i.e., Due to illness or injury, pt requires aid of supportive devices such as crutches, cane, wheelchairs, walkers, the use of special transportation or the assistance of another person to leave their place of residence. There is a normal inability to leave the home and doing so requires considerable and taxing effort. Other absences are for medical reasons / religious services and are infrequent or of short duration when for other reasons). o Please direct any NON-WOUND related issues/requests for orders to patient's Primary Care Physician Wound #5 [place] Visits - [place] Nurse may visit PRN to address patientos wound care needs. o FACE TO FACE ENCOUNTER: MEDICARE and MEDICAID  PATIENTS: I certify that this patient is under my care and that I had a face-to-face encounter that meets the physician face-to-face encounter requirements with this patient on this date. The encounter with the patient was in whole or in part for the following MEDICAL CONDITION: (primary reason for [place]) MEDICAL NECESSITY: I certify, that based on my findings, NURSING services are a medically necessary home health service. HOME BOUND STATUS: I certify that my clinical findings support that this patient is homebound (i.e., Due to illness or injury, pt requires aid of supportive devices such as crutches, cane, wheelchairs, walkers, the use of special transportation or the assistance of another person to leave their place of residence. There is a normal inability to leave the home and doing so requires considerable and taxing effort. Other absences are for medical reasons / religious services and are infrequent or of short duration when for other reasons). o Please direct any NON-WOUND related issues/requests for orders to patient's Primary Care Physician Wound #6 Left Toe [place] Visits - [place] Nurse may visit PRN to address patientos wound care needs. o FACE TO FACE ENCOUNTER: MEDICARE and MEDICAID PATIENTS: I certify that this patient is under my care and that I had a face-to-face encounter that meets the physician face-to-face encounter requirements with this patient on this date. The encounter with the patient was in whole or in part for the following MEDICAL CONDITION: (primary reason for [place]) IVAN, MASKELL ([id number]) MEDICAL NECESSITY: I certify, that based on my findings, NURSING services are a medically necessary home health service. HOME BOUND STATUS: I certify that my clinical findings support that this patient is homebound (i.e., Due to illness or injury, pt requires  aid of supportive devices such as  crutches, cane, wheelchairs, walkers, the use of special transportation or the assistance of another person to leave their place of residence. There is a normal inability to leave the home and doing so requires considerable and taxing effort. Other absences are for medical reasons / religious services and are infrequent or of short duration when for other reasons). o Please direct any NON-WOUND related issues/requests for orders to patient's Primary Care Physician Electronic Signature(s) Signed: 03/14/2017 1:47:27 PM By: Christin Fudge MD, FACS Signed: 03/14/2017 4:59:14 PM By: Gretta Cool RN, BSN, Kim RN, BSN Entered By: Gretta Cool, RN, BSN, Kim on 03/14/2017 11:42:39 Brooke Hopkins ([id number]) -------------------------------------------------------------------------------- Problem List Details Patient Name: SHONDA, MANDARINO. Date of Service: 03/14/2017 10:30 AM Medical Record Number: [id number] Patient Account Number: [id number] Date of Birth/Sex: [birth year] (66 y.o. 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 Date of Service: 03/14/2017 10:30 AM Medical Record Number: [id number] Patient Account Number: [id number] Date of Birth/Sex: [birth year] (66 y.o. Female) Treating RN: [name] Barman Primary Care Provider: FITZGERALD, DAVID Other Clinician: Referring Provider: FITZGERALD, DAVID Treating Provider/Extender: Frann Rider in Treatment: 35 Subjective Chief Complaint Information obtained from Patient Mrs. Spath returns for follow-up to her bilateral lower extremity ulcers History of Present Illness (HPI) The following HPI elements were documented for the patient's wound: Location: massive swelling of both lower extremities and ulceration both lower extremities Quality: Patient reports experiencing a dull pain to affected area(s). Severity: Patient states wound are getting worse. Duration: Patient has had the wound for >2 years prior to seeking treatment at the wound center Timing: Pain in wound is constant (hurts all the time) Context: The wound appeared gradually over time Modifying Factors: Other treatment(s) tried include:she has a lymphedema pump but uses it seldom and she's had several course of antibiotics Associated Signs and Symptoms: Patient reports having difficulty standing for long periods. 66 year old patient seen by Dr. Ola Spurr of infectious disease who has been following up for left lower extremity cellulitis and ulcer with recurrent bilateral lower extremity problems for several months. Recently she had a large right lower extremity bullae which opened out and has been ulcerated. She has seen the vascular group and has been getting Unna's wraps and has a lymphedema pump used in the past. Her prior cultures were positive for Pseudomonas, Proteus and was treated with amoxicillin. He has also been treated with 2 weeks course  of ciprofloxacin and amoxicillin.. Increase of Lasix dose helped with the edema and echo showed no systolic CHF but may be diastolic problems. Past medical history significant for diabetes mellitus type 2, venous stasis ulcer, obesity, diabetic peripheral neuropathy, status post back surgery, cholecystectomy, hysterectomy, arthroscopy of the knee. He is a former smoker and quit smoking in 1984. The patient  has seen Dr. Delana Meyer who did not recommend any arterial or venous duplex studies and has been using Unna's wraps and also recommended a lymphedema pump. she has been very noncompliant with using these. 06/28/2016 -- the patient is still on antibiotics as prescribed by Dr. Ola Spurr and he is asked her to take it for 3 weeks. The patient also says she has a lot of redness and pain in the folds of her thigh and lower extremity and this is very painful. 07/26/2016 -- the patient is off antibiotics and has been getting dressing changes 3 times a week. 08/09/2016 -- is been on Cipro and is taking potassium supplements along with her Lasix and is going to be seeing Dr. Ola Spurr for a consult return visit only in November 2017. REED, EIFERT ([id number]) 08/23/2016 -- she was admitted to the hospital last week and I have reviewed these reports in detail. She was seen by Dr. Ola Spurr who noted a Pseudomonas infection which was resistant to ciprofloxacin and discharged her on Ceftazidime 1 g IV every 12 hourly anyone days. The antibiotic was to be stopped on September 11 and he would see her back in the clinic. 08/30/2016 -- had a communication from Dr. Ola Spurr that he would extend her antibiotics by a week if she continued to look like cellulitis was persisting. 09/13/2016 -- the PICC line is out and antibiotics have stopped. 10/04/16: returns today for f/u. reports utilizes compression pump twice daily. she is compliant with her compression therapy. she reports a new blister on the right  proximal posterior calf. no systemic s/s of infection. 10/11/16: returns today for f/u. she reports adherence to her compression pumps, but there is no improvement regarding her BLE edema. there is weeping of fluid. denies fever, chills, body aches or malaise. 10/18/2016 -- she has a significant cellulitis of her right lower extremity which was not there last week. I believe at this stage she will need the expertise of Dr. Ola Spurr to decide on antibiotic coverage. 10/25/2016 -- the patient has had cipro and ampicillin been started by Dr. Ola Spurr and he is awaiting further cultures. Overall she's been feeling better. 11/08/2016 -- Dr. Ola Spurr this week who has continued with ciprofloxacin and amoxicillin and is awaiting further cultures before deciding to place her on a PICC line. 11/15/2016 -- Dr. Blane Ohara note was reviewed and her cultures growing Pseudomonas in both legs which is sensitive to Cipro and the other is resistant to Cipro and she would get a PICC line and start IV antibiotics -- Ceftazidime 1 g q 12 for 3 weeks. 11-29-16 Mrs. Hardt presents today in follow-up of her bilateral lower exterminators ulcers. She remains on IV antibiotic therapy via a PICC line per Dr. Ola Spurr; she has a follow-up appointment with him on Monday, 12/02/2016. She anticipates that the about therapy will be discontinued at that appointment. She denies any complications of nausea vomiting and/or diarrhea accompanied by this antibiotic therapy. She admits to using her lymphedema pumps twice daily with the exception of yesterday. She denies any concerns or complications with the current treatment plan. 12/06/2016 -- Dr. Ola Spurr saw her recently on 12/02/2016 and due to the fact she has had significant problems with recurrent ulceration and Pseudomonas infection he recommended 3 more weeks of antibiotics and extended it until December 25. The PICC line will be in place. Last hemoglobin A1c  on November 30 was 9% 12/27/2016 -- because of the holidays the patient's diet has been higher in salt, her dressings have  not been done as required and she is not used to lymphedema pumps appropriately. This has led to her lymphedema increasing markedly. 01/24/2017 -- the patient's hospital bed is malfunctioning and hence her legs have been flat in bed and her lymphedema is increased markedly. During my examination I also noted a unstageable pressure injury to her right heel 02/07/2017 -- she has returned after 2 weeks as she was done with influenza and has been in bed a lot, with a new bed which has helped her reduce the edema due to proper elevation. Will she has a full-fledged large pressure injury to the right heel. 02/28/2017 -- hilar dressing was being taken down with scissors she had a sharp injury to the left medial calf area which is fairly superficial. ZEDA, GANGWER. ([id number]) Objective Constitutional Pulse regular. Respirations normal and unlabored. Afebrile. Vitals Time Taken: 10:40 AM, Height: 67 in, Weight: 300 lbs, BMI: 47, Temperature: 98.0 F, Pulse: 84 bpm, Respiratory Rate: 18 breaths/min, Blood Pressure: 195/58 mmHg. Eyes Nonicteric. Reactive to light. Ears, Nose, Mouth, and Throat Lips, teeth, and gums WNL.Marland Kitchen Moist mucosa without lesions. Neck supple and nontender. No palpable supraclavicular or cervical adenopathy. Normal sized without goiter. Respiratory WNL. No retractions.. Cardiovascular Pedal Pulses WNL. No clubbing, cyanosis or edema. Chest Breasts symmetical and no nipple discharge.. Breast tissue WNL, no masses, lumps, or tenderness.. Gastrointestinal (GI) Abdomen without masses or tenderness.. No liver or spleen enlargement or tenderness.. Lymphatic No adneopathy. No adenopathy. No adenopathy. Musculoskeletal Adexa without tenderness or enlargement.. Digits and nails w/o clubbing, cyanosis, infection, petechiae, ischemia, or inflammatory  conditions.Marland Kitchen Psychiatric Judgement and insight Intact.. No evidence of depression, anxiety, or agitation.. General Notes: both lower extremities are looking much better and the wounds are clean. The lymphedema has gone down significantly. The right heel did not need any debridement today and there is healthy granulation tissue. The left big toe has some superficial abrasions on the dorsum and we will keep this under observation. Integumentary (Hair, Skin) GUIDA, ASMAN. ([id number]) No suspicious lesions. No crepitus or fluctuance. No peri-wound warmth or erythema. No masses.. Wound #1 status is Open. Original cause of wound was Gradually Appeared. The wound is located on the Left,Posterior Lower Leg. The wound measures 0.2cm length x 0.3cm width x 0.1cm depth; 0.047cm^2 area and 0.005cm^3 volume. The wound is limited to skin breakdown. There is no tunneling noted. There is a large amount of serous drainage noted. The wound margin is flat and intact. There is no granulation within the wound bed. There is a small (1-33%) amount of necrotic tissue within the wound bed including Eschar. The periwound skin appearance exhibited: Ecchymosis, Rubor. The periwound skin appearance did not exhibit: Callus, Crepitus, Excoriation, Induration, Rash, Scarring, Dry/Scaly, Maceration, Atrophie Blanche, Cyanosis, Hemosiderin Staining, Mottled, Pallor, Erythema. Periwound temperature was noted as No Abnormality. The periwound has tenderness on palpation. Wound #2 status is Open. Original cause of wound was Gradually Appeared. The wound is located on the Right,Posterior Lower Leg. The wound measures 0.2cm length x 0.2cm width x 0.1cm depth; 0.031cm^2 area and 0.003cm^3 volume. The wound is limited to skin breakdown. There is no tunneling or undermining noted. There is a none present amount of drainage noted. The wound margin is indistinct and nonvisible. There is no granulation within the wound bed. There is  a small (1-33%) amount of necrotic tissue within the wound bed including Eschar. The periwound skin appearance exhibited: Dry/Scaly. The periwound skin appearance did not exhibit: Callus, Crepitus, Excoriation, Induration, Rash, Scarring,  Maceration, Atrophie Blanche, Cyanosis, Ecchymosis, Hemosiderin Staining, Mottled, Pallor, Rubor, Erythema. Periwound temperature was noted as No Abnormality. The periwound has tenderness on palpation. Wound #3 status is Open. Original cause of wound was Pressure Injury. The wound is located on the Right Calcaneus. The wound measures 0.1cm length x 0.1cm width x 0.1cm depth; 0.008cm^2 area and 0.001cm^3 volume. There is no tunneling or undermining noted. There is a large amount of serosanguineous drainage noted. The wound margin is distinct with the outline attached to the wound base. There is no granulation within the wound bed. There is no necrotic tissue within the wound bed. The periwound skin appearance exhibited: Maceration. Periwound temperature was noted as No Abnormality. The periwound has tenderness on palpation. Wound #4 status is Open. Original cause of wound was Trauma. The wound is located on the Left,Anterior Lower Leg. The wound measures 0.3cm length x 0.4cm width x 0.1cm depth; 0.094cm^2 area and 0.009cm^3 volume. The wound is limited to skin breakdown. There is no tunneling or undermining noted. There is a large amount of serosanguineous drainage noted. The wound margin is distinct with the outline attached to the wound base. There is no granulation within the wound bed. There is no necrotic tissue within the wound bed. The periwound skin appearance exhibited: Dry/Scaly. Periwound temperature was noted as No Abnormality. The periwound has tenderness on palpation. Wound #5 status is Open. Original cause of wound was Trauma. The wound is located on the Right,Anterior Lower Leg. The wound measures 0.1cm length x 0.1cm width x 0.1cm depth;  0.008cm^2 area and 0.001cm^3 volume. The wound is limited to skin breakdown. There is no tunneling or undermining noted. There is a medium amount of serosanguineous drainage noted. The wound margin is distinct with the outline attached to the wound base. There is large (67-100%) red granulation within the wound bed. There is no necrotic tissue within the wound bed. Periwound temperature was noted as No Abnormality. The periwound has tenderness on palpation. Wound #6 status is Open. Original cause of wound was Gradually Appeared. The wound is located on the Left Toe Great. The wound measures 1cm length x 2cm width x 0.1cm depth; 1.571cm^2 area and 0.157cm^3 volume. The wound is limited to skin breakdown. There is no tunneling or undermining noted. There is a small amount of serous drainage noted. The wound margin is indistinct and nonvisible. There is small (1-33%) red, friable granulation within the wound bed. There is a small (1-33%) amount of necrotic MACAELA, PRESAS. ([id number]) tissue within the wound bed including Eschar. The periwound skin appearance exhibited: Rubor. 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 - clinic use Cleanse wound with mild soap and water - HHRN please scrub wounds with mild soap and water May Shower, gently pat wound dry prior to applying new dressing. May shower with protection. Wound #2 Right,Posterior Lower Leg: Clean wound with Normal Saline. - clinic use Cleanse wound with mild soap and water - HHRN please scrub wounds with  mild soap and water May Shower, gently pat wound dry prior to applying new dressing. May shower with protection. Wound #3 Right  Calcaneus: Clean wound with Normal Saline. - clinic use Cleanse wound with mild soap and water - HHRN please scrub wounds with mild soap and water May Shower, gently pat wound dry prior to applying new dressing. May shower with protection. Wound #4 Left,Anterior Lower Leg: Clean wound with Normal Saline. - clinic use Cleanse wound with mild soap and water - HHRN please scrub wounds with mild soap and water May Shower, gently pat wound dry prior to applying new dressing. May shower with protection. Wound #5 Right,Anterior Lower Leg: Clean wound with Normal Saline. - clinic use Cleanse wound with mild soap and water - HHRN please scrub wounds with mild soap and water May Shower, gently pat wound dry prior to applying new dressing. May shower with protection. PEACHES, VANOVERBEKE ([id number]) Wound #6 Left Toe Great: Clean wound with Normal Saline. - clinic use Cleanse wound with mild soap and water - HHRN please scrub wounds with mild soap and water May Shower, gently pat wound dry prior to applying new dressing. May shower with protection. Anesthetic: Wound #1 Left,Posterior Lower Leg: Topical Lidocaine 4% cream applied to wound bed prior to debridement - clinic use Wound #2 Right,Posterior Lower Leg: Topical Lidocaine 4% cream applied to wound bed prior to debridement - clinic use Wound #3 Right Calcaneus: Topical Lidocaine 4% cream applied to wound bed prior to debridement - clinic use Wound #4 Left,Anterior Lower Leg: Topical Lidocaine 4% cream applied to wound bed prior to debridement - clinic use Wound #5 Right,Anterior Lower Leg: Topical Lidocaine 4% cream applied to wound bed prior to debridement - clinic use Wound #6 Left Toe Great: Topical Lidocaine 4% cream applied to wound bed prior to debridement - clinic use Skin Barriers/Peri-Wound  Care: Wound #1 Left,Posterior Lower Leg: Moisturizing lotion - PLEASE PUT LOTION ON THE PTS LEGS (NOT ON WOUNDS) Wound #2 Right,Posterior Lower Leg: Moisturizing lotion - PLEASE PUT LOTION ON THE PTS LEGS (NOT ON WOUNDS) Wound #3 Right Calcaneus: Moisturizing lotion - PLEASE PUT LOTION ON THE PTS LEGS (NOT ON WOUNDS) Wound #4 Left,Anterior Lower Leg: Moisturizing lotion - PLEASE PUT LOTION ON THE PTS LEGS (NOT ON WOUNDS) Wound #5 Right,Anterior Lower Leg: Moisturizing lotion - PLEASE PUT LOTION ON THE PTS LEGS (NOT ON WOUNDS) Wound #6 Left Toe Great: Moisturizing lotion - PLEASE PUT LOTION ON THE PTS LEGS (NOT ON WOUNDS) Primary Wound Dressing: Wound #1 Left,Posterior Lower Leg: Prisma Ag - moisten with saline Wound #2 Right,Posterior Lower Leg: Prisma Ag - moisten with saline Wound #3 Right Calcaneus: Prisma Ag - moisten with saline Wound #4 Left,Anterior Lower Leg: Prisma Ag - moisten with saline Wound #5 Right,Anterior Lower Leg: Prisma Ag - moisten with saline Wound #6 Left Toe Great: Aquacel Ag Secondary Dressing: Wound #1 Left,Posterior Lower Leg: ABD pad Wound #2 Right,Posterior Lower Leg: ABD pad Wound #3 Right Calcaneus: ABD pad GEORGIANNE, GRITZ ([id number]) Wound #4 Left,Anterior Lower Leg: ABD pad Wound #5 Right,Anterior Lower Leg: ABD pad Wound #6 Left Toe Great: Conform/Kerlix Dressing Change Frequency: Wound #1 Left,Posterior Lower Leg: Change Dressing Monday, Wednesday, Friday - HHRN to change wraps Monday and Wednesday Wound #2 Right,Posterior Lower Leg: Change Dressing Monday, Wednesday, Friday - HHRN to change wraps Monday and Wednesday Wound #3 Right Calcaneus: Change Dressing Monday, Wednesday, Friday - HHRN to change wraps Monday and Wednesday Wound #4 Left,Anterior Lower Leg: Change Dressing Monday, Wednesday, Friday - HHRN to change wraps Monday and Wednesday Wound #5 Right,Anterior Lower Leg: Change Dressing Monday, Wednesday, Friday -  HHRN  to change wraps Monday and Wednesday Wound #6 Left Toe Great: Change Dressing Monday, Wednesday, Friday - HHRN to change wraps Monday and Wednesday Follow-up Appointments: Wound #1 Left,Posterior Lower Leg: Return Appointment in 1 week. Wound #2 Right,Posterior Lower Leg: Return Appointment in 1 week. Wound #3 Right Calcaneus: Return Appointment in 1 week. Wound #4 Left,Anterior Lower Leg: Return Appointment in 1 week. Wound #5 Right,Anterior Lower Leg: Return Appointment in 1 week. Wound #6 Left Toe Great: Return Appointment in 1 week. Edema Control: Wound #1 Left,Posterior Lower Leg: 3 Layer Compression System - Bilateral - NO UNNA Elevate legs to the level of the heart and pump ankles as often as possible Compression Pump: Use compression pump on left lower extremity for 30 minutes, twice daily. Compression Pump: Use compression pump on right lower extremity for 30 minutes, twice daily. Support Garment 20-30 mm/Hg pressure to: - HHRN TO ORDER COMPRESSION STOCKINGS- JUXTALITE VELCRO COMPRESSION WRAPS MEASUREMENTS ARE- RIGHT ANKLE- 27CM RIGHT CALF-46CM RIGHT HEEL TO KNEE-47.8CM LEFT ANKLE-27CM LEFT CALF-48CM LEFT HEEL TO KNEE-47.8CM Wound #2 Right,Posterior Lower Leg: 3 Layer Compression System - Bilateral - NO UNNA Elevate legs to the level of the heart and pump ankles as often as possible Compression Pump: Use compression pump on left lower extremity for 30 minutes, twice daily. Compression Pump: Use compression pump on right lower extremity for 30 minutes, twice daily. Support Garment 20-30 mm/Hg pressure to: - HHRN TO ORDER COMPRESSION STOCKINGS- JUXTALITE VELCRO COMPRESSION WRAPS MEASUREMENTS ARE- RIGHT ANKLE- 27CM RIGHT CALF-46CM RIGHT HEEL TO KNEE-47.8CM LEFT ANKLE-27CM LEFT CALF-48CM LEFT HEEL TO KNEE-47.8CM PAYETON, GERMANI. ([id number]) Wound #3 Right Calcaneus: 3 Layer Compression System - Bilateral - NO UNNA Elevate legs to the level of the heart and pump ankles as  often as possible Compression Pump: Use compression pump on left lower extremity for 30 minutes, twice daily. Compression Pump: Use compression pump on right lower extremity for 30 minutes, twice daily. Support Garment 20-30 mm/Hg pressure to: - HHRN TO ORDER COMPRESSION STOCKINGS- JUXTALITE VELCRO COMPRESSION WRAPS MEASUREMENTS ARE- RIGHT ANKLE- 27CM RIGHT CALF-46CM RIGHT HEEL TO KNEE-47.8CM LEFT ANKLE-27CM LEFT CALF-48CM LEFT HEEL TO KNEE-47.8CM Wound #4 Left,Anterior Lower Leg: 3 Layer Compression System - Bilateral - NO UNNA Elevate legs to the level of the heart and pump ankles as often as possible Compression Pump: Use compression pump on left lower extremity for 30 minutes, twice daily. Compression Pump: Use compression pump on right lower extremity for 30 minutes, twice daily. Support Garment 20-30 mm/Hg pressure to: - HHRN TO ORDER COMPRESSION STOCKINGS- JUXTALITE VELCRO COMPRESSION WRAPS MEASUREMENTS ARE- RIGHT ANKLE- 27CM RIGHT CALF-46CM RIGHT HEEL TO KNEE-47.8CM LEFT ANKLE-27CM LEFT CALF-48CM LEFT HEEL TO KNEE-47.8CM Wound #5 Right,Anterior Lower Leg: 3 Layer Compression System - Bilateral - NO UNNA Elevate legs to the level of the heart and pump ankles as often as possible Compression Pump: Use compression pump on left lower extremity for 30 minutes, twice daily. Compression Pump: Use compression pump on right lower extremity for 30 minutes, twice daily. Support Garment 20-30 mm/Hg pressure to: - HHRN TO ORDER COMPRESSION STOCKINGS- JUXTALITE VELCRO COMPRESSION WRAPS MEASUREMENTS ARE- RIGHT ANKLE- 27CM RIGHT CALF-46CM RIGHT HEEL TO KNEE-47.8CM LEFT ANKLE-27CM LEFT CALF-48CM LEFT HEEL TO KNEE-47.8CM Wound #6 Left Toe Great: 3 Layer Compression System - Bilateral - NO UNNA Elevate legs to the level of the heart and pump ankles as often as possible Compression Pump: Use compression pump on left lower extremity for 30 minutes, twice daily. Compression Pump: Use compression  pump  on right lower extremity for 30 minutes, twice daily. Support Garment 20-30 mm/Hg pressure to: - HHRN TO ORDER COMPRESSION STOCKINGS- JUXTALITE VELCRO COMPRESSION WRAPS MEASUREMENTS ARE- RIGHT ANKLE- 27CM RIGHT CALF-46CM RIGHT HEEL TO KNEE-47.8CM LEFT ANKLE-27CM LEFT CALF-48CM LEFT HEEL TO KNEE-47.8CM Additional Orders / Instructions: Wound #1 Left,Posterior Lower Leg: Increase protein intake. Wound #2 Right,Posterior Lower Leg: Increase protein intake. Wound #3 Right Calcaneus: Increase protein intake. Wound #4 Left,Anterior Lower Leg: Increase protein intake. Wound #5 Right,Anterior Lower Leg: Increase protein intake. Wound #6 Left Toe Great: Increase protein intake. Home Health: Wound #1 Left,Posterior Lower Leg: CHYRL, ELWELL ([id number]) [place] [facility] - [place] Nurse may visit PRN to address patient s wound care needs. FACE TO FACE ENCOUNTER: MEDICARE and MEDICAID PATIENTS: I certify that this patient is under my care and that I had a face-to-face encounter that meets the physician face-to-face encounter requirements with this patient on this date. The encounter with the patient was in whole or in part for the following MEDICAL CONDITION: (primary reason for [place]) MEDICAL NECESSITY: I certify, that based on my findings, NURSING services are a medically necessary home health service. HOME BOUND STATUS: I certify that my clinical findings support that this patient is homebound (i.e., Due to illness or injury, pt requires aid of supportive devices such as crutches, cane, wheelchairs, walkers, the use of special transportation or the assistance of another person to leave their place of residence. There is a normal inability to leave the home and doing so requires considerable and taxing effort. Other absences are for medical reasons / religious services and are infrequent or of short duration when for other reasons). Please direct any  NON-WOUND related issues/requests for orders to patient's Primary Care Physician Wound #2 Right,Posterior Lower Leg: [place] [facility] - [place] Nurse may visit PRN to address patient s wound care needs. FACE TO FACE ENCOUNTER: MEDICARE and MEDICAID PATIENTS: I certify that this patient is under my care and that I had a face-to-face encounter that meets the physician face-to-face encounter requirements with this patient on this date. The encounter with the patient was in whole or in part for the following MEDICAL CONDITION: (primary reason for [place]) MEDICAL NECESSITY: I certify, that based on my findings, NURSING services are a medically necessary home health service. HOME BOUND STATUS: I certify that my clinical findings support that this patient is homebound (i.e., Due to illness or injury, pt requires aid of supportive devices such as crutches, cane, wheelchairs, walkers, the use of special transportation or the assistance of another person to leave their place of residence. There is a normal inability to leave the home and doing so requires considerable and taxing effort. Other absences are for medical reasons / religious services and are infrequent or of short duration when for other reasons). Please direct any NON-WOUND related issues/requests for orders to patient's Primary Care Physician Wound #3 Right Calcaneus: [place] [facility] - [place] Nurse may visit PRN to address patient s wound care needs. FACE TO FACE ENCOUNTER: MEDICARE and MEDICAID PATIENTS: I certify that this patient is under my care and that I had a face-to-face encounter that meets the physician face-to-face encounter requirements with this patient on this date. The encounter with the patient was in whole or in part for the following MEDICAL CONDITION: (primary reason for [place]) MEDICAL NECESSITY: I certify, that based on my findings, NURSING services are  a medically  necessary home health service. HOME BOUND STATUS: I certify that my clinical findings support that this patient is homebound (i.e., Due to illness or injury, pt requires aid of supportive devices such as crutches, cane, wheelchairs, walkers, the use of special transportation or the assistance of another person to leave their place of residence. There is a normal inability to leave the home and doing so requires considerable and taxing effort. Other absences are for medical reasons / religious services and are infrequent or of short duration when for other reasons). Please direct any NON-WOUND related issues/requests for orders to patient's Primary Care Physician Wound #4 Left,Anterior Lower Leg: [place] [facility] - [place] Nurse may visit PRN to address patient s wound care needs. FACE TO FACE ENCOUNTER: MEDICARE and MEDICAID PATIENTS: I certify that this patient is under my care and that I had a face-to-face encounter that meets the physician face-to-face encounter requirements with this patient on this date. The encounter with the patient was in whole or in part for the following MEDICAL CONDITION: (primary reason for [place]) MEDICAL NECESSITY: I certify, that based on my findings, NURSING services are a medically necessary home health service. HOME BOUND STATUS: I certify that my clinical findings support that this patient is homebound (i.e., Due to illness or injury, pt requires aid of supportive devices such as crutches, cane, wheelchairs, walkers, the use MASSIAH, LONGANECKER. ([id number]) of special transportation or the assistance of another person to leave their place of residence. There is a normal inability to leave the home and doing so requires considerable and taxing effort. Other absences are for medical reasons / religious services and are infrequent or of short duration when for other reasons). Please direct any NON-WOUND related  issues/requests for orders to patient's Primary Care Physician Wound #5 Right,Anterior Lower Leg: [place] [facility] - [place] Nurse may visit PRN to address patient s wound care needs. FACE TO FACE ENCOUNTER: MEDICARE and MEDICAID PATIENTS: I certify that this patient is under my care and that I had a face-to-face encounter that meets the physician face-to-face encounter requirements with this patient on this date. The encounter with the patient was in whole or in part for the following MEDICAL CONDITION: (primary reason for [place]) MEDICAL NECESSITY: I certify, that based on my findings, NURSING services are a medically necessary home health service. HOME BOUND STATUS: I certify that my clinical findings support that this patient is homebound (i.e., Due to illness or injury, pt requires aid of supportive devices such as crutches, cane, wheelchairs, walkers, the use of special transportation or the assistance of another person to leave their place of residence. There is a normal inability to leave the home and doing so requires considerable and taxing effort. Other absences are for medical reasons / religious services and are infrequent or of short duration when for other reasons). Please direct any NON-WOUND related issues/requests for orders to patient's Primary Care Physician Wound #6 Left Toe Great: [place] [facility] - [place] Nurse may visit PRN to address patient s wound care needs. FACE TO FACE ENCOUNTER: MEDICARE and MEDICAID PATIENTS: I certify that this patient is under my care and that I had a face-to-face encounter that meets the physician face-to-face encounter requirements with this patient on this date. The encounter with the patient was in whole or in part for the following MEDICAL CONDITION: (primary reason for [place]) MEDICAL NECESSITY: I certify, that based on my findings,  NURSING services are a medically  necessary home health service. HOME BOUND STATUS: I certify that my clinical findings support that this patient is homebound (i.e., Due to illness or injury, pt requires aid of supportive devices such as crutches, cane, wheelchairs, walkers, the use of special transportation or the assistance of another person to leave their place of residence. There is a normal inability to leave the home and doing so requires considerable and taxing effort. Other absences are for medical reasons / religious services and are infrequent or of short duration when for other reasons). Please direct any NON-WOUND related issues/requests for orders to patient's Primary Care Physician I have recommended: 1. silver collagen and drawtex and a 3 layer Profore to both lower extremities. 2. She will continue with elevation and exercise and her lymphedema pumps -- I had a discussion with her regarding the importance of this 3. changing her dressings twice a week. 4. right heel will need silver collagen, be protected with a heel cup and again offloading techniques have been discussed in great detail 5. I have had a detailed discussion with them, asked to be compliant with the appropriate dietary restriction, elevation and use of her lymphedema pumps. I have commended her on being very good with her diet and elevation of the leg LARRI, BREWTON ([id number]) Electronic Signature(s) Signed: 03/14/2017 1:49:10 PM By: Christin Fudge MD, FACS Previous Signature: 03/14/2017 11:24:27 AM Version By: Christin Fudge MD, FACS Entered By: Christin Fudge on 03/14/2017 13:49:10 Brooke Hopkins ([id number]) -------------------------------------------------------------------------------- SuperBill Details Patient Name: ELLEIGH, CASSETTA. Date of Service: 03/14/2017 Medical Record Number: [id number] Patient Account Number: [id number] Date of Birth/Sex: 1951-05-03 (66 y.o. 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 E66.01 Morbid (severe) obesity due to excess calories L89.612 Pressure ulcer of right heel, stage 2 Physician Procedures CPT4 Code: [id number] Description: 33383 - WC PHYS LEVEL 3 - EST PT ICD-10 Description Diagnosis E11.622 Type 2 diabetes mellitus with other skin ulcer I89.0 Lymphedema, not elsewhere classified 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

## 2017-03-21 ENCOUNTER — Encounter: Payer: 59 | Admitting: Surgery

## 2017-03-21 DIAGNOSIS — L97529 Non-pressure chronic ulcer of other part of left foot with unspecified severity: Secondary | ICD-10-CM | POA: Diagnosis not present

## 2017-03-21 DIAGNOSIS — L97219 Non-pressure chronic ulcer of right calf with unspecified severity: Secondary | ICD-10-CM | POA: Diagnosis not present

## 2017-03-21 DIAGNOSIS — L97212 Non-pressure chronic ulcer of right calf with fat layer exposed: Secondary | ICD-10-CM | POA: Diagnosis not present

## 2017-03-21 DIAGNOSIS — Z87891 Personal history of nicotine dependence: Secondary | ICD-10-CM | POA: Diagnosis not present

## 2017-03-21 DIAGNOSIS — I890 Lymphedema, not elsewhere classified: Secondary | ICD-10-CM | POA: Diagnosis not present

## 2017-03-21 DIAGNOSIS — L97819 Non-pressure chronic ulcer of other part of right lower leg with unspecified severity: Secondary | ICD-10-CM | POA: Diagnosis not present

## 2017-03-21 DIAGNOSIS — L97222 Non-pressure chronic ulcer of left calf with fat layer exposed: Secondary | ICD-10-CM | POA: Diagnosis not present

## 2017-03-21 DIAGNOSIS — L97419 Non-pressure chronic ulcer of right heel and midfoot with unspecified severity: Secondary | ICD-10-CM | POA: Diagnosis not present

## 2017-03-21 DIAGNOSIS — E11622 Type 2 diabetes mellitus with other skin ulcer: Secondary | ICD-10-CM | POA: Diagnosis not present

## 2017-03-21 DIAGNOSIS — L97829 Non-pressure chronic ulcer of other part of left lower leg with unspecified severity: Secondary | ICD-10-CM | POA: Diagnosis not present

## 2017-03-21 DIAGNOSIS — L97221 Non-pressure chronic ulcer of left calf limited to breakdown of skin: Secondary | ICD-10-CM | POA: Diagnosis not present

## 2017-03-22 NOTE — Progress Notes (Signed)
AIGNER, HORSEMAN ([id number]) Visit Report for 03/21/2017 Chief Complaint Document Details Patient Name: Brooke Hopkins, Brooke Hopkins. Date of Service: 03/21/2017 8:45 AM Medical Record Number: [id number] Patient Account Number: [id number] Date of Birth/Sex: 19-Aug-1951 (66 y.o. Female) Treating RN: Ahmed Prima Primary Care Provider: FITZGERALD, DAVID Other Clinician: Referring Provider: FITZGERALD, DAVID Treating Provider/Extender: Frann Rider in Treatment: 30 Information Obtained from: Patient Chief Complaint Brooke Hopkins returns for follow-up to her bilateral lower extremity ulcers Electronic Signature(s) Signed: 03/21/2017 9:14:04 AM By: Christin Fudge MD, FACS Entered By: Christin Fudge on 03/21/2017 09:14:04 Brooke Hopkins ([id number]) -------------------------------------------------------------------------------- HPI Details Patient Name: Brooke Hopkins, Brooke Hopkins. Date of Service: 03/21/2017 8:45 AM Medical Record Number: [id number] Patient Account Number: [id number] Date of Birth/Sex: 1951/04/02 (66 y.o. Female) Treating RN: Ahmed Prima Primary Care Provider: FITZGERALD, DAVID Other Clinician: Referring Provider: FITZGERALD, DAVID Treating Provider/Extender: Frann Rider in Treatment: 38 History of Present Illness Location: massive swelling of both lower extremities and ulceration both lower extremities Quality: Patient reports experiencing a dull pain to affected area(s). Severity: Patient states wound are getting worse. Duration: Patient has had the wound for >2 years prior to seeking treatment at the wound center Timing: Pain in wound is constant (hurts all the time) Context: The wound appeared gradually over time Modifying Factors: Other treatment(s) tried include:she has a lymphedema pump but uses it seldom and she's had several course of antibiotics Associated Signs and Symptoms: Patient reports having difficulty standing for long periods. HPI Description:  66 year old patient seen by Dr. Ola Spurr of infectious disease who has been following up for left lower extremity cellulitis and ulcer with recurrent bilateral lower extremity problems for several months. Recently she had a large right lower extremity bullae which opened out and has been ulcerated. She has seen the vascular group and has been getting Unna's wraps and has a lymphedema pump used in the past. Her prior cultures were positive for Pseudomonas, Proteus and was treated with amoxicillin. He has also been treated with 2 weeks course of ciprofloxacin and amoxicillin.. Increase of Lasix dose helped with the edema and echo showed no systolic CHF but may be diastolic problems. Past medical history significant for diabetes mellitus type 2, venous stasis ulcer, obesity, diabetic peripheral neuropathy, status post back surgery, cholecystectomy, hysterectomy, arthroscopy of the knee. He is a former smoker and quit smoking in 1984. The patient has seen Dr. Delana Meyer who did not recommend any arterial or venous duplex studies and has been using Unna's wraps and also recommended a lymphedema pump. she has been very noncompliant with using these. 06/28/2016 -- the patient is still on antibiotics as prescribed by Dr. Ola Spurr and he is asked her to take it for 3 weeks. The patient also says she has a lot of redness and pain in the folds of her thigh and lower extremity and this is very painful. 07/26/2016 -- the patient is off antibiotics and has been getting dressing changes 3 times a week. 08/09/2016 -- is been on Cipro and is taking potassium supplements along with her Lasix and is going to be seeing Dr. Ola Spurr for a consult return visit only in November 2017. 08/23/2016 -- she was admitted to the hospital last week and I have reviewed these reports in detail. She was seen by Dr. Ola Spurr who noted a Pseudomonas infection which was resistant to ciprofloxacin and discharged her on  Ceftazidime 1 g IV every 12 hourly anyone days. The antibiotic was to be stopped on September 11 and he would see her  back in the clinic. 08/30/2016 -- had a communication from Dr. Ola Spurr that he would extend her antibiotics by a week if she continued to look like cellulitis was persisting. Brooke Hopkins, Brooke Hopkins ([id number]) 09/13/2016 -- the PICC line is out and antibiotics have stopped. 10/04/16: returns today for f/u. reports utilizes compression pump twice daily. she is compliant with her compression therapy. she reports a new blister on the right proximal posterior calf. no systemic s/s of infection. 10/11/16: returns today for f/u. she reports adherence to her compression pumps, but there is no improvement regarding her BLE edema. there is weeping of fluid. denies fever, chills, body aches or malaise. 10/18/2016 -- she has a significant cellulitis of her right lower extremity which was not there last week. I believe at this stage she will need the expertise of Dr. Ola Spurr to decide on antibiotic coverage. 10/25/2016 -- the patient has had cipro and ampicillin been started by Dr. Ola Spurr and he is awaiting further cultures. Overall she's been feeling better. 11/08/2016 -- Dr. Ola Spurr this week who has continued with ciprofloxacin and amoxicillin and is awaiting further cultures before deciding to place her on a PICC line. 11/15/2016 -- Dr. Blane Ohara note was reviewed and her cultures growing Pseudomonas in both legs which is sensitive to Cipro and the other is resistant to Cipro and she would get a PICC line and start IV antibiotics -- Ceftazidime 1 g q 12 for 3 weeks. 11-29-16 Brooke Hopkins presents today in follow-up of her bilateral lower exterminators ulcers. She remains on IV antibiotic therapy via a PICC line per Dr. Ola Spurr; she has a follow-up appointment with him on Monday, 12/02/2016. She anticipates that the about therapy will be discontinued at that appointment.  She denies any complications of nausea vomiting and/or diarrhea accompanied by this antibiotic therapy. She admits to using her lymphedema pumps twice daily with the exception of yesterday. She denies any concerns or complications with the current treatment plan. 12/06/2016 -- Dr. Ola Spurr saw her recently on 12/02/2016 and due to the fact she has had significant problems with recurrent ulceration and Pseudomonas infection he recommended 3 more weeks of antibiotics and extended it until December 25. The PICC line will be in place. Last hemoglobin A1c on November 30 was 9% 12/27/2016 -- because of the holidays the patient's diet has been higher in salt, her dressings have not been done as required and she is not used to lymphedema pumps appropriately. This has led to her lymphedema increasing markedly. 01/24/2017 -- the patient's hospital bed is malfunctioning and hence her legs have been flat in bed and her lymphedema is increased markedly. During my examination I also noted a unstageable pressure injury to her right heel 02/07/2017 -- she has returned after 2 weeks as she was done with influenza and has been in bed a lot, with a new bed which has helped her reduce the edema due to proper elevation. Will she has a full-fledged large pressure injury to the right heel. 02/28/2017 -- hilar dressing was being taken down with scissors she had a sharp injury to the left medial calf area which is fairly superficial. Electronic Signature(s) Signed: 03/21/2017 9:14:11 AM By: Christin Fudge MD, FACS Entered By: Christin Fudge on 03/21/2017 09:14:11 Brooke Hopkins ([id number]) -------------------------------------------------------------------------------- Physical Exam Details Patient Name: Brooke Hopkins, Brooke Hopkins. Date of Service: 03/21/2017 8:45 AM Medical Record Number: [id number] Patient Account Number: [id number] Date of Birth/Sex: 10-Jun-1951 (66 y.o. Female) Treating RN: Ahmed Prima Primary  Care Provider: FITZGERALD, DAVID Other  Clinician: Referring Provider: FITZGERALD, DAVID Treating Provider/Extender: Frann Rider in Treatment: 39 Constitutional . Pulse regular. Respirations normal and unlabored. Afebrile. . Eyes Nonicteric. Reactive to light. Ears, Nose, Mouth, and Throat Lips, teeth, and gums WNL.Marland Kitchen Moist mucosa without lesions. Neck supple and nontender. No palpable supraclavicular or cervical adenopathy. Normal sized without goiter. Respiratory WNL. No retractions.. Breath sounds WNL, No rubs, rales, rhonchi, or wheeze.. Cardiovascular Heart rhythm and rate regular, no murmur or gallop.. Pedal Pulses WNL. No clubbing, cyanosis or edema. Chest Breasts symmetical and no nipple discharge.. Breast tissue WNL, no masses, lumps, or tenderness.. Lymphatic No adneopathy. No adenopathy. No adenopathy. Musculoskeletal Adexa without tenderness or enlargement.. Digits and nails w/o clubbing, cyanosis, infection, petechiae, ischemia, or inflammatory conditions.. Integumentary (Hair, Skin) No suspicious lesions. No crepitus or fluctuance. No peri-wound warmth or erythema. No masses.Marland Kitchen Psychiatric Judgement and insight Intact.. No evidence of depression, anxiety, or agitation.. Notes both lower extremity have well-controlled lymphedema and the wounds are all looking very clean and no sharp debridement was required today. Electronic Signature(s) Signed: 03/21/2017 9:14:34 AM By: Christin Fudge MD, FACS Entered By: Christin Fudge on 03/21/2017 09:14:34 Brooke Hopkins ([id number]) -------------------------------------------------------------------------------- Physician Orders Details Patient Name: Brooke Hopkins, Brooke Hopkins. Date of Service: 03/21/2017 8:45 AM Medical Record Number: [id number] Patient Account Number: [id number] Date of Birth/Sex: 23-Aug-1951 (66 y.o. Female) Treating RN: Ahmed Prima Primary Care Provider: FITZGERALD, DAVID Other Clinician: Referring  Provider: FITZGERALD, DAVID Treating Provider/Extender: Frann Rider in Treatment: 73 Verbal / Phone Orders: Yes Clinician: Carolyne Fiscal, Debi Read Back and Verified: Yes Diagnosis Coding Wound Cleansing Wound #1 Left,Posterior Lower Leg o Clean wound with Normal Saline. - clinic use o Cleanse wound with mild soap and water - HHRN please scrub wounds with mild soap and water o May Shower, gently pat wound dry prior to applying new dressing. o May shower with protection. Wound #3 Right Calcaneus o Clean wound with Normal Saline. - clinic use o Cleanse wound with mild soap and water - HHRN please scrub wounds with mild soap and water o May Shower, gently pat wound dry prior to applying new dressing. o May shower with protection. Anesthetic Wound #1 Left,Posterior Lower Leg o Topical Lidocaine 4% cream applied to wound bed prior to debridement - clinic use Wound #3 Right Calcaneus o Topical Lidocaine 4% cream applied to wound bed prior to debridement - clinic use Skin Barriers/Peri-Wound Care Wound #1 Left,Posterior Lower Leg o Moisturizing lotion - PLEASE PUT LOTION ON THE PTS LEGS (NOT ON WOUNDS) Wound #3 Right Calcaneus o Moisturizing lotion - PLEASE PUT LOTION ON THE PTS LEGS (NOT ON WOUNDS) Primary Wound Dressing Wound #1 Left,Posterior Lower Leg o Prisma Ag - moisten with saline Wound #3 Right Calcaneus o Prisma Ag - moisten with saline Secondary Dressing Wound #1 Left,Posterior Lower Leg Brooke Hopkins, BAINS. ([id number]) o ABD pad Wound #3 Right Calcaneus o Dry Gauze o Other - heel cup Dressing Change Frequency Wound #1 Left,Posterior Lower Leg o Other: - Twice a week. HHRN to change wraps on Tuesdays Wound #3 Right Calcaneus o Other: - Twice a week. HHRN to change wraps on Tuesdays Follow-up Appointments Wound #1 Left,Posterior Lower Leg o Return Appointment in 1 week. Wound #3 Right Calcaneus o Return Appointment in  1 week. Edema Control Wound #1 Left,Posterior Lower Leg o 3 Layer Compression System - Bilateral - unna to foot only o Elevate legs to the level of the heart and pump ankles as often as possible o Compression Pump: Use compression  pump on left lower extremity for 30 minutes, twice daily. o Compression Pump: Use compression pump on right lower extremity for 30 minutes, twice daily. o Support Garment 20-30 mm/Hg pressure to: - HHRN TO ORDER COMPRESSION STOCKINGS- JUXTALITE VELCRO COMPRESSION WRAPS MEASUREMENTS ARE- RIGHT ANKLE- 27CM RIGHT CALF-46CM RIGHT HEEL TO KNEE-47.8CM LEFT ANKLE-27CM LEFT CALF-48CM LEFT HEEL TO KNEE-47.8CM Wound #3 Right Calcaneus o 3 Layer Compression System - Bilateral - unna to foot only o Elevate legs to the level of the heart and pump ankles as often as possible o Compression Pump: Use compression pump on left lower extremity for 30 minutes, twice daily. o Compression Pump: Use compression pump on right lower extremity for 30 minutes, twice daily. Brooke Hopkins, MACALUSO. ([id number]) o Support Garment 20-30 mm/Hg pressure to: - HHRN TO ORDER COMPRESSION STOCKINGS- JUXTALITE VELCRO COMPRESSION WRAPS MEASUREMENTS ARE- RIGHT ANKLE- 27CM RIGHT CALF-46CM RIGHT HEEL TO KNEE-47.8CM LEFT ANKLE-27CM LEFT CALF-48CM LEFT HEEL TO KNEE-47.8CM Additional Orders / Instructions Wound #1 Left,Posterior Lower Leg o Increase protein intake. Wound #3 Right Calcaneus o Increase protein intake. Home Health Wound #1 [facility] [place] Nurse may visit PRN to address patientos wound care needs. o FACE TO FACE ENCOUNTER: MEDICARE and MEDICAID PATIENTS: I certify that this patient is under my care and that I had a face-to-face encounter that meets the physician face-to-face encounter requirements with this patient on this date. The encounter with the patient was in whole or in part for  the following MEDICAL CONDITION: (primary reason for [place]) MEDICAL NECESSITY: I certify, that based on my findings, NURSING services are a medically necessary home health service. HOME BOUND STATUS: I certify that my clinical findings support that this patient is homebound (i.e., Due to illness or injury, pt requires aid of supportive devices such as crutches, cane, wheelchairs, walkers, the use of special transportation or the assistance of another person to leave their place of residence. There is a normal inability to leave the home and doing so requires considerable and taxing effort. Other absences are for medical reasons / religious services and are infrequent or of short duration when for other reasons). o Please direct any NON-WOUND related issues/requests for orders to patient's Primary Care Physician Wound #3 Right Calcaneus o [facility] [place] Nurse may visit PRN to address patientos wound care needs. o FACE TO FACE ENCOUNTER: MEDICARE and MEDICAID PATIENTS: I certify that this patient is under my care and that I had a face-to-face encounter that meets the physician face-to-face encounter requirements with this patient on this date. The encounter with the patient was in whole or in part for the following MEDICAL CONDITION: (primary reason for [place]) MEDICAL NECESSITY: I certify, that based on my findings, NURSING services are a medically necessary home health service. HOME BOUND STATUS: I certify that my clinical findings support that this patient is homebound (i.e., Due to illness or injury, pt requires aid of supportive devices such as crutches, cane, wheelchairs, walkers, the use of special transportation or the assistance of another person to leave their place of residence. There is a normal inability to leave the home and doing so requires considerable and taxing effort. ZYAIRAH, WACHA  ([id number]) absences are for medical reasons / religious services and are infrequent or of short duration when for other reasons). o Please direct any NON-WOUND related issues/requests for orders to patient's Primary Care Physician Electronic Signature(s) Signed:  03/21/2017 4:18:08 PM By: Christin Fudge MD, FACS Signed: 03/21/2017 4:34:11 PM By: Alric Quan Entered By: Alric Quan on 03/21/2017 09:32:31 Brooke Hopkins ([id number]) -------------------------------------------------------------------------------- Problem List Details Patient Name: Brooke Hopkins, DARIS. Date of Service: 03/21/2017 8:45 AM Medical Record Number: [id number] Patient Account Number: [id number] Date of Birth/Sex: Jul 23, 1951 (66 y.o. Female) Treating RN: Ahmed Prima Primary Care Provider: FITZGERALD, DAVID Other Clinician: Referring Provider: FITZGERALD, DAVID Treating Provider/Extender: Frann Rider in Treatment: 48 Active Problems ICD-10 Encounter Code Description Active Date Diagnosis E11.622 Type 2 diabetes mellitus with other skin ulcer 10/25/2016 Yes I89.0 Lymphedema, not elsewhere classified 10/25/2016 Yes L97.222 Non-pressure chronic ulcer of left calf with fat layer 10/25/2016 Yes exposed L97.212 Non-pressure chronic ulcer of right calf with fat layer 10/25/2016 Yes exposed E66.01 Morbid (severe) obesity due to excess calories 10/25/2016 Yes L89.612 Pressure ulcer of right heel, stage 2 02/07/2017 Yes Inactive Problems Resolved Problems Electronic Signature(s) Signed: 03/21/2017 9:13:52 AM By: Christin Fudge MD, FACS Entered By: Christin Fudge on 03/21/2017 09:13:51 Brooke Hopkins ([id number]) -------------------------------------------------------------------------------- Progress Note Details Patient Name: Brooke Hopkins, LAKER. Date of Service: 03/21/2017 8:45 AM Medical Record Number: [id number] Patient Account Number: [id number] Date of Birth/Sex: 1951-05-05 (66 y.o.  Female) Treating RN: Ahmed Prima Primary Care Provider: FITZGERALD, DAVID Other Clinician: Referring Provider: FITZGERALD, DAVID Treating Provider/Extender: Frann Rider in Treatment: 66 Subjective Chief Complaint Information obtained from Patient Mrs. Rahimi returns for follow-up to her bilateral lower extremity ulcers History of Present Illness (HPI) The following HPI elements were documented for the patient's wound: Location: massive swelling of both lower extremities and ulceration both lower extremities Quality: Patient reports experiencing a dull pain to affected area(s). Severity: Patient states wound are getting worse. Duration: Patient has had the wound for >2 years prior to seeking treatment at the wound center Timing: Pain in wound is constant (hurts all the time) Context: The wound appeared gradually over time Modifying Factors: Other treatment(s) tried include:she has a lymphedema pump but uses it seldom and she's had several course of antibiotics Associated Signs and Symptoms: Patient reports having difficulty standing for long periods. 66 year old patient seen by Dr. Ola Spurr of infectious disease who has been following up for left lower extremity cellulitis and ulcer with recurrent bilateral lower extremity problems for several months. Recently she had a large right lower extremity bullae which opened out and has been ulcerated. She has seen the vascular group and has been getting Unna's wraps and has a lymphedema pump used in the past. Her prior cultures were positive for Pseudomonas, Proteus and was treated with amoxicillin. He has also been treated with 2 weeks course of ciprofloxacin and amoxicillin.. Increase of Lasix dose helped with the edema and echo showed no systolic CHF but may be diastolic problems. Past medical history significant for diabetes mellitus type 2, venous stasis ulcer, obesity, diabetic peripheral neuropathy, status post back  surgery, cholecystectomy, hysterectomy, arthroscopy of the knee. He is a former smoker and quit smoking in 1984. The patient has seen Dr. Delana Meyer who did not recommend any arterial or venous duplex studies and has been using Unna's wraps and also recommended a lymphedema pump. she has been very noncompliant with using these. 06/28/2016 -- the patient is still on antibiotics as prescribed by Dr. Ola Spurr and he is asked her to take it for 3 weeks. The patient also says she has a lot of redness and pain in the folds of her thigh and lower extremity and this is very painful. 07/26/2016 -- the patient  is off antibiotics and has been getting dressing changes 3 times a week. 08/09/2016 -- is been on Cipro and is taking potassium supplements along with her Lasix and is going to be seeing Dr. Ola Spurr for a consult return visit only in November 2017. Brooke Hopkins, Brooke Hopkins ([id number]) 08/23/2016 -- she was admitted to the hospital last week and I have reviewed these reports in detail. She was seen by Dr. Ola Spurr who noted a Pseudomonas infection which was resistant to ciprofloxacin and discharged her on Ceftazidime 1 g IV every 12 hourly anyone days. The antibiotic was to be stopped on September 11 and he would see her back in the clinic. 08/30/2016 -- had a communication from Dr. Ola Spurr that he would extend her antibiotics by a week if she continued to look like cellulitis was persisting. 09/13/2016 -- the PICC line is out and antibiotics have stopped. 10/04/16: returns today for f/u. reports utilizes compression pump twice daily. she is compliant with her compression therapy. she reports a new blister on the right proximal posterior calf. no systemic s/s of infection. 10/11/16: returns today for f/u. she reports adherence to her compression pumps, but there is no improvement regarding her BLE edema. there is weeping of fluid. denies fever, chills, body aches or malaise. 10/18/2016 -- she has  a significant cellulitis of her right lower extremity which was not there last week. I believe at this stage she will need the expertise of Dr. Ola Spurr to decide on antibiotic coverage. 10/25/2016 -- the patient has had cipro and ampicillin been started by Dr. Ola Spurr and he is awaiting further cultures. Overall she's been feeling better. 11/08/2016 -- Dr. Ola Spurr this week who has continued with ciprofloxacin and amoxicillin and is awaiting further cultures before deciding to place her on a PICC line. 11/15/2016 -- Dr. Blane Ohara note was reviewed and her cultures growing Pseudomonas in both legs which is sensitive to Cipro and the other is resistant to Cipro and she would get a PICC line and start IV antibiotics -- Ceftazidime 1 g q 12 for 3 weeks. 11-29-16 Mrs. Matkins presents today in follow-up of her bilateral lower exterminators ulcers. She remains on IV antibiotic therapy via a PICC line per Dr. Ola Spurr; she has a follow-up appointment with him on Monday, 12/02/2016. She anticipates that the about therapy will be discontinued at that appointment. She denies any complications of nausea vomiting and/or diarrhea accompanied by this antibiotic therapy. She admits to using her lymphedema pumps twice daily with the exception of yesterday. She denies any concerns or complications with the current treatment plan. 12/06/2016 -- Dr. Ola Spurr saw her recently on 12/02/2016 and due to the fact she has had significant problems with recurrent ulceration and Pseudomonas infection he recommended 3 more weeks of antibiotics and extended it until December 25. The PICC line will be in place. Last hemoglobin A1c on November 30 was 9% 12/27/2016 -- because of the holidays the patient's diet has been higher in salt, her dressings have not been done as required and she is not used to lymphedema pumps appropriately. This has led to her lymphedema increasing markedly. 01/24/2017 -- the  patient's hospital bed is malfunctioning and hence her legs have been flat in bed and her lymphedema is increased markedly. During my examination I also noted a unstageable pressure injury to her right heel 02/07/2017 -- she has returned after 2 weeks as she was done with influenza and has been in bed a lot, with a new bed which has helped her  reduce the edema due to proper elevation. Will she has a full-fledged large pressure injury to the right heel. 02/28/2017 -- hilar dressing was being taken down with scissors she had a sharp injury to the left medial calf area which is fairly superficial. Brooke Hopkins, VARMA. ([id number]) Objective Constitutional Pulse regular. Respirations normal and unlabored. Afebrile. Vitals Time Taken: 8:39 AM, Height: 67 in, Weight: 300 lbs, BMI: 47, Temperature: 97.7 F, Pulse: 83 bpm, Respiratory Rate: 18 breaths/min, Blood Pressure: 185/75 mmHg. Eyes Nonicteric. Reactive to light. Ears, Nose, Mouth, and Throat Lips, teeth, and gums WNL.Marland Kitchen Moist mucosa without lesions. Neck supple and nontender. No palpable supraclavicular or cervical adenopathy. Normal sized without goiter. Respiratory WNL. No retractions.. Breath sounds WNL, No rubs, rales, rhonchi, or wheeze.. Cardiovascular Heart rhythm and rate regular, no murmur or gallop.. Pedal Pulses WNL. No clubbing, cyanosis or edema. Chest Breasts symmetical and no nipple discharge.. Breast tissue WNL, no masses, lumps, or tenderness.. Lymphatic No adneopathy. No adenopathy. No adenopathy. Musculoskeletal Adexa without tenderness or enlargement.. Digits and nails w/o clubbing, cyanosis, infection, petechiae, ischemia, or inflammatory conditions.Marland Kitchen Psychiatric Judgement and insight Intact.. No evidence of depression, anxiety, or agitation.. General Notes: both lower extremity have well-controlled lymphedema and the wounds are all looking very clean and no sharp debridement was required today. Integumentary  (Hair, Skin) No suspicious lesions. No crepitus or fluctuance. No peri-wound warmth or erythema. No masses.. Wound #1 status is Open. Original cause of wound was Gradually Appeared. The wound is located on the Left,Posterior Lower Leg. The wound measures 0.1cm length x 0.1cm width x 0.1cm depth; 0.008cm^2 area and 0.001cm^3 volume. The wound is limited to skin breakdown. There is no tunneling or undermining Brooke Hopkins, HARTWELL. ([id number]) noted. There is a none present amount of drainage noted. The wound margin is flat and intact. There is large (67-100%) red granulation within the wound bed. There is a small (1-33%) amount of necrotic tissue within the wound bed including Adherent Slough. The periwound skin appearance exhibited: Ecchymosis, Rubor. The periwound skin appearance did not exhibit: Callus, Crepitus, Excoriation, Induration, Rash, Scarring, Dry/Scaly, Maceration, Atrophie Blanche, Cyanosis, Hemosiderin Staining, Mottled, Pallor, Erythema. Periwound temperature was noted as No Abnormality. The periwound has tenderness on palpation. Wound #2 status is Healed - Epithelialized. Original cause of wound was Gradually Appeared. The wound is located on the Right,Posterior Lower Leg. The wound measures 0cm length x 0cm width x 0cm depth; 0cm^2 area and 0cm^3 volume. The wound is limited to skin breakdown. There is no tunneling or undermining noted. There is a none present amount of drainage noted. The wound margin is indistinct and nonvisible. There is no granulation within the wound bed. There is no necrotic tissue within the wound bed. The periwound skin appearance exhibited: Dry/Scaly. The periwound skin appearance did not exhibit: Callus, Crepitus, Excoriation, Induration, Rash, Scarring, Maceration, Atrophie Blanche, Cyanosis, Ecchymosis, Hemosiderin Staining, Mottled, Pallor, Rubor, Erythema. Periwound temperature was noted as No Abnormality. The periwound has tenderness on  palpation. Wound #3 status is Open. Original cause of wound was Pressure Injury. The wound is located on the Right Calcaneus. The wound measures 0.1cm length x 0.1cm width x 0.1cm depth; 0.008cm^2 area and 0.001cm^3 volume. There is no tunneling or undermining noted. There is a large amount of serosanguineous drainage noted. The wound margin is distinct with the outline attached to the wound base. There is no granulation within the wound bed. There is a large (67-100%) amount of necrotic tissue within the wound bed including Adherent Slough.  The periwound skin appearance exhibited: Maceration. Periwound temperature was noted as No Abnormality. The periwound has tenderness on palpation. Wound #4 status is Healed - Epithelialized. Original cause of wound was Trauma. The wound is located on the Left,Anterior Lower Leg. The wound measures 0cm length x 0cm width x 0cm depth; 0cm^2 area and 0cm^3 volume. Wound #5 status is Healed - Epithelialized. Original cause of wound was Trauma. The wound is located on the Right,Anterior Lower Leg. The wound measures 0cm length x 0cm width x 0cm depth; 0cm^2 area and 0cm^3 volume. The wound is limited to skin breakdown. There is no tunneling or undermining noted. There is a none present amount of drainage noted. The wound margin is distinct with the outline attached to the wound base. There is no granulation within the wound bed. There is no necrotic tissue within the wound bed. Periwound temperature was noted as No Abnormality. The periwound has tenderness on palpation. Wound #6 status is Healed - Epithelialized. Original cause of wound was Gradually Appeared. The wound is located on the Left Toe Great. The wound measures 0cm length x 0cm width x 0cm depth; 0cm^2 area and 0cm^3 volume. The wound is limited to skin breakdown. There is no tunneling or undermining noted. There is a none present amount of drainage noted. The wound margin is indistinct and nonvisible.  There is no granulation within the wound bed. There is no necrotic tissue within the wound bed. The periwound skin appearance exhibited: Rubor. 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 - clinic use Cleanse wound with mild soap and water - HHRN please scrub wounds with mild soap and water May Shower, gently pat wound dry prior to applying new dressing. May shower with protection. Wound #3 Right Calcaneus: Clean wound with Normal Saline. - clinic use Cleanse wound with mild soap and water - HHRN please scrub wounds with mild soap and water May Shower, gently pat wound dry prior to applying new dressing. May shower with protection. Anesthetic: Wound #1 Left,Posterior Lower Leg: Topical Lidocaine 4% cream applied to wound bed prior to debridement - clinic use Wound #3 Right Calcaneus: Topical Lidocaine 4% cream applied to wound bed prior to debridement - clinic use Skin Barriers/Peri-Wound Care: Wound #1 Left,Posterior Lower Leg: Moisturizing lotion - PLEASE PUT LOTION ON THE PTS LEGS (NOT ON WOUNDS) Wound #3 Right Calcaneus: Moisturizing lotion - PLEASE PUT LOTION ON THE PTS LEGS (NOT ON WOUNDS) Primary Wound Dressing: Wound #1 Left,Posterior Lower Leg: Prisma Ag - moisten with saline Wound #3 Right Calcaneus: Prisma Ag - moisten with saline Secondary Dressing: Wound #1 Left,Posterior Lower Leg: ABD pad Wound #3 Right Calcaneus: Dry Gauze Other - heel cup Dressing Change FrequencyMAJESTA, LEICHTER  ([id number]) Wound #1 Left,Posterior Lower Leg: Other: - Twice a week. HHRN to change wraps on Tuesdays Wound #3 Right Calcaneus: Other: - Twice a week. HHRN to change wraps on Tuesdays Follow-up Appointments: Wound #1 Left,Posterior Lower Leg: Return Appointment in 1 week. Wound #3 Right Calcaneus: Return Appointment in 1 week. Edema Control: Wound #1 Left,Posterior Lower Leg: 3 Layer  Compression System - Bilateral - unna to foot only Elevate legs to the level of the heart and pump ankles as often as possible Compression Pump: Use compression pump on left lower extremity for 30 minutes, twice daily. Compression Pump: Use compression pump on right lower extremity for 30 minutes, twice daily. Support Garment 20-30 mm/Hg pressure to: - HHRN TO ORDER COMPRESSION STOCKINGS- JUXTALITE VELCRO COMPRESSION WRAPS MEASUREMENTS ARE- RIGHT ANKLE- 27CM RIGHT CALF-46CM RIGHT HEEL TO KNEE-47.8CM LEFT ANKLE-27CM LEFT CALF-48CM LEFT HEEL TO KNEE-47.8CM Wound #3 Right Calcaneus: 3 Layer Compression System - Bilateral - unna to foot only Elevate legs to the level of the heart and pump ankles as often as possible Compression Pump: Use compression pump on left lower extremity for 30 minutes, twice daily. Compression Pump: Use compression pump on right lower extremity for 30 minutes, twice daily. Support Garment 20-30 mm/Hg pressure to: - HHRN TO ORDER COMPRESSION STOCKINGS- JUXTALITE VELCRO COMPRESSION WRAPS MEASUREMENTS ARE- RIGHT ANKLE- 27CM RIGHT CALF-46CM RIGHT HEEL TO KNEE-47.8CM LEFT ANKLE-27CM LEFT CALF-48CM LEFT HEEL TO KNEE-47.8CM Additional Orders / Instructions: Wound #1 Left,Posterior Lower Leg: Increase protein intake. Wound #3 Right Calcaneus: Increase protein intake. Home Health: Wound #1 Left,Posterior Lower Leg: Continue Home Health Visits - [place] Nurse may visit PRN to address patient s wound care needs. FACE TO FACE ENCOUNTER: MEDICARE and MEDICAID PATIENTS: I  certify that this patient is under my care and that I had a face-to-face encounter that meets the physician face-to-face encounter requirements with this patient on this date. The encounter with the patient was in whole or in part for the following MEDICAL CONDITION: (primary reason for [place]) MEDICAL NECESSITY: I certify, that based on my findings, NURSING services are a medically necessary home health service. HOME BOUND STATUS: I certify that my clinical findings support that this patient is homebound (i.e., Due to illness or injury, pt requires aid of supportive devices such as crutches, cane, wheelchairs, walkers, the use of special transportation or the assistance of another person to leave their place of residence. There is a normal inability to leave the home and doing so requires considerable and taxing effort. Other absences are for medical reasons / religious services and are infrequent or of short duration when for other reasons). Please direct any NON-WOUND related issues/requests for orders to patient's Primary Care Physician Wound #3 Right Calcaneus: [place] Visits - [place] Nurse may visit PRN to address patient s wound care needs. SHANOAH, ASBILL. ([id number]) FACE TO FACE ENCOUNTER: MEDICARE and MEDICAID PATIENTS: I certify that this patient is under my care and that I had a face-to-face encounter that meets the physician face-to-face encounter requirements with this patient on this date. The encounter with the patient was in whole or in part for the following MEDICAL CONDITION: (primary reason for [place]) MEDICAL NECESSITY: I certify, that based on my findings, NURSING services are a medically necessary home health service. HOME BOUND STATUS: I certify that my clinical findings support that this patient is homebound (i.e., Due to illness or injury, pt requires aid of supportive devices such as crutches, cane, wheelchairs, walkers,  the use of special transportation or the assistance of another person to leave their place of residence. There is a normal inability to leave the home and doing so requires considerable and taxing effort. Other absences are for medical reasons / religious services and are infrequent or of short duration when for other reasons). Please direct any NON-WOUND related issues/requests for  orders to patient's Primary Care Physician I have recommended: 1. silver collagen and drawtex and a 3 layer Profore to both lower extremities. 2. She will continue with elevation and exercise and her lymphedema pumps -- I had a discussion with her regarding the importance of this 3. changing her dressings twice a week. 4. right heel will need silver collagen, be protected with a heel cup and again offloading techniques have been discussed in great detail 5. I have had a detailed discussion with them, asked to be compliant with the appropriate dietary restriction, elevation and use of her lymphedema pumps. I have commended her on being very good with her diet and elevation of the leg 6. next week I have asked her to bring her compression stockings and possibly juxta lites if they have been ordered by a home health. Electronic Signature(s) Signed: 03/21/2017 1:39:27 PM By: Christin Fudge MD, FACS Previous Signature: 03/21/2017 9:15:42 AM Version By: Christin Fudge MD, FACS Entered By: Christin Fudge on 03/21/2017 13:39:27 Brooke Hopkins ([id number]) -------------------------------------------------------------------------------- SuperBill Details Patient Name: DESTINEY, SANABIA. Date of Service: 03/21/2017 Medical Record Number: [id number] Patient Account Number: [id number] Date of Birth/Sex: 1951-09-29 (66 y.o. 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 Physician Procedures CPT4 Code: [id number] Description: 49675 - WC PHYS LEVEL 3 - EST PT ICD-10 Description Diagnosis E11.622 Type 2 diabetes mellitus with other skin ulcer I89.0 Lymphedema, not elsewhere classified L97.222 Non-pressure chronic ulcer of left calf with fat 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 Modifier: layer exposed layer exposed Quantity: 1 Electronic Signature(s) Signed: 03/21/2017 4:18:08 PM By: Christin Fudge MD, FACS Signed: 03/21/2017 4:34:11 PM By: Alric Quan Previous Signature: 03/21/2017 9:15:55 AM Version By: Christin Fudge MD, FACS Entered By: Alric Quan on 03/21/2017 09:33:06

## 2017-03-22 NOTE — Progress Notes (Signed)
Brooke, Hopkins ([id number]) Visit Report for 03/21/2017 Arrival Information Details Patient Name: Brooke Hopkins, Brooke Hopkins. Date of Service: 03/21/2017 8:45 AM Medical Record Number: [id number] Patient Account Number: [id number] Date of Birth/Sex: Mar 05, 1951 (66 y.o. Female) Treating RN: Ahmed Prima Primary Care Daionna Crossland: FITZGERALD, DAVID Other Clinician: Referring Seniah Lawrence: FITZGERALD, DAVID Treating Charmion Hapke/Extender: Frann Rider in Treatment: 43 Visit Information History Since Last Visit All ordered tests and consults were completed: No Patient Arrived: Wheel Chair Added or deleted any medications: No Arrival Time: 08:38 Any new allergies or adverse reactions: No Accompanied By: son Had a fall or experienced change in No activities of daily living that may affect Transfer Assistance: None risk of falls: Patient Identification Verified: Yes Signs or symptoms of abuse/neglect since last No Secondary Verification Process Yes visito Completed: Hospitalized since last visit: No Patient Requires Transmission-Based No Has Dressing in Place as Prescribed: Yes Precautions: Has Compression in Place as Prescribed: Yes Patient Has Alerts: No Pain Present Now: No Electronic Signature(s) Signed: 03/21/2017 4:34:11 PM By: Alric Quan Entered By: Alric Quan on 03/21/2017 08:39:21 Brooke Hopkins ([id number]) -------------------------------------------------------------------------------- Encounter Discharge Information Details Patient Name: Brooke, Hopkins. Date of Service: 03/21/2017 8:45 AM Medical Record Number: [id number] Patient Account Number: [id number] Date of Birth/Sex: 29-Aug-1951 (66 y.o. Female) Treating RN: Ahmed Prima Primary Care Jomayra Novitsky: FITZGERALD, DAVID Other Clinician: Referring Jesselyn Rask: FITZGERALD, DAVID Treating Joycelynn Fritsche/Extender: Frann Rider in Treatment: 50 Encounter Discharge Information Items Discharge Pain Level:  0 Discharge Condition: Stable Ambulatory Status: Wheelchair Discharge Destination: Home Transportation: Private Auto Accompanied By: son Schedule Follow-up Appointment: Yes Medication Reconciliation completed and provided to Patient/Care No Elda Dunkerson: Provided on Clinical Summary of Care: 03/21/2017 Form Type Recipient Paper Patient LB Electronic Signature(s) Signed: 03/21/2017 9:28:34 AM By: Ruthine Dose Entered By: Ruthine Dose on 03/21/2017 09:28:34 Brooke Hopkins ([id number]) -------------------------------------------------------------------------------- Lower Extremity Assessment Details Patient Name: Brooke, Hopkins. Date of Service: 03/21/2017 8:45 AM Medical Record Number: [id number] Patient Account Number: [id number] Date of Birth/Sex: [birth year] (66 y.o. Female) Treating RN: Ahmed Prima Primary Care Leondro Coryell: FITZGERALD, DAVID Other Clinician: Referring Maryjane Benedict: FITZGERALD, DAVID Treating Kinzlee Selvy/Extender: Frann Rider in Treatment: 39 Edema Assessment Assessed: [Left: No] [Right: No] E[Left: dema] [Right: :] Calf Left: Right: Point of Measurement: 32 cm From Medial Instep 47.6 cm 48.5 cm Ankle Left: Right: Point of Measurement: 11 cm From Medial Instep 30 cm 28.2 cm Vascular Assessment Pulses: Dorsalis Pedis Palpable: [Left:Yes] [Right:Yes] Posterior Tibial Extremity colors, hair growth, and conditions: Extremity Color: [Left:Red] [Right:Red] Temperature of Extremity: [Left:Warm] [Right:Warm] Capillary Refill: [Left:< 3 seconds] [Right:< 3 seconds] Electronic Signature(s) Signed: 03/21/2017 4:34:11 PM By: Alric Quan Entered By: Alric Quan on 03/21/2017 08:59:05 Brooke Hopkins ([id number]) -------------------------------------------------------------------------------- Multi Wound Chart Details Patient Name: Brooke Hopkins. Date of Service: 03/21/2017 8:45 AM Medical Record Number: [id number] Patient Account Number:  [id number] Date of Birth/Sex: 07-06-1951 (66 y.o. Female) Treating RN: Ahmed Prima Primary Care Damesha Lawler: FITZGERALD, DAVID Other Clinician: Referring Norvell Ureste: FITZGERALD, DAVID Treating Kristain Filo/Extender: Frann Rider in Treatment: 39 Vital Signs Height(in): 67 Pulse(bpm): 83 Weight(lbs): 300 Blood Pressure 185/75 (mmHg): Body Mass Index(BMI): 47 Temperature(F): 97.7 Respiratory Rate 18 (breaths/min): Photos: [1:No Photos] [2:No Photos] [3:No Photos] Wound Location: [1:Left Lower Leg - Posterior Right Lower Leg -] [2:Posterior] [3:Right Calcaneus] Wounding Event: [1:Gradually Appeared] [2:Gradually Appeared] [3:Pressure Injury] Primary Etiology: [1:Lymphedema] [2:Lymphedema] [3:Pressure Ulcer] Date Acquired: [1:12/31/2015] [2:12/31/2015] [3:01/24/2017] Weeks of Treatment: [1:39] [2:39] [3:6] Wound Status: [1:Open] [2:Healed - Epithelialized] [3:Open] Clustered Wound: [1:No] [2:Yes] [3:No] Clustered Quantity: [1:N/A] [2:3] [3:N/A]  Measurements L x W x D 0.1x0.1x0.1 [2:0x0x0] [3:0.1x0.1x0.1] (cm) Area (cm) : [1:0.008] [2:0] [3:0.008] Volume (cm) : [1:0.001] [2:0] [3:0.001] % Reduction in Area: [1:100.00%] [2:100.00%] [3:99.90%] % Reduction in Volume: 100.00% [2:100.00%] [3:99.90%] Classification: [1:Partial Thickness] [2:Partial Thickness] [3:Category/Stage II] Exudate Amount: [1:None Present] [2:None Present] [3:Large] Exudate Type: [1:N/A] [2:N/A] [3:Serosanguineous] Exudate Color: [1:N/A] [2:N/A] [3:red, brown] Foul Odor After [1:No] [2:Yes] [3:No] Cleansing: Odor Anticipated Due to N/A [2:No] [3:N/A] Product Use: Wound Margin: [1:Flat and Intact] [2:Indistinct, nonvisible] [3:Distinct, outline attached] Granulation Amount: [1:Large (67-100%)] [2:None Present (0%)] [3:None Present (0%)] Granulation Quality: [1:Red] [2:N/A] [3:N/A] Necrotic Amount: [1:Small (1-33%)] [2:None Present (0%)] [3:Large (67-100%)] Exposed Structures: SEENA, RITACCO  ([id number]) Fascia: No Fascia: No Fascia: No Fat Layer (Subcutaneous Fat Layer (Subcutaneous Fat Layer (Subcutaneous Tissue) Exposed: No Tissue) Exposed: No Tissue) Exposed: No Tendon: No Tendon: No Muscle: No Muscle: No Joint: No Joint: No Bone: No Bone: No Limited to Skin Limited to Skin Breakdown Breakdown Epithelialization: Large (67-100%) Large (67-100%) Large (67-100%) Periwound Skin Texture: Excoriation: No Excoriation: No No Abnormalities Noted Induration: No Induration: No Callus: No Callus: No Crepitus: No Crepitus: No Rash: No Rash: No Scarring: No Scarring: No Periwound Skin Maceration: No Dry/Scaly: Yes Maceration: Yes Moisture: Dry/Scaly: No Maceration: No Periwound Skin Color: Ecchymosis: Yes Atrophie Blanche: No No Abnormalities Noted Rubor: Yes Cyanosis: No Atrophie Blanche: No Ecchymosis: No Cyanosis: No Erythema: No Erythema: No Hemosiderin Staining: No Hemosiderin Staining: No Mottled: No Mottled: No Pallor: No Pallor: No Rubor: No Temperature: No Abnormality No Abnormality No Abnormality Tenderness on Yes Yes Yes Palpation: Wound Preparation: Ulcer Cleansing: Other: Ulcer Cleansing: Other: Ulcer Cleansing: soap and water soap and water Rinsed/Irrigated with Saline Topical Anesthetic Topical Anesthetic Applied: None Applied: None Topical Anesthetic Applied: None Wound Number: [id number]  Photos: No Photos No Photos No Photos Wound Location: Left, Anterior Lower Leg Right Lower Leg - Anterior Left Toe Great Wounding Event: Trauma Trauma Gradually Appeared Primary Etiology: Trauma, Other Trauma, Other Pressure Ulcer Date Acquired: 02/24/2017 03/07/2017 02/28/2017 Weeks of Treatment: 3 2 1  Wound Status: Healed - Epithelialized Healed - Epithelialized Healed - Epithelialized Clustered Wound: No No No Clustered Quantity: N/A N/A N/A Measurements L x W x D 0x0x0 0x0x0 0x0x0 (cm) Area (cm) : 0 0 0 Volume (cm) : 0 0 0 Felty, Sherhonda  M. ([id number]) % Reduction in Area: 100.00% 100.00% 100.00% % Reduction in Volume: 100.00% 100.00% 100.00% Classification: Partial Thickness Partial Thickness Category/Stage II Exudate Amount: N/A None Present None Present Exudate Type: N/A N/A N/A Exudate Color: N/A N/A N/A Foul Odor After N/A No No Cleansing: Odor Anticipated Due to N/A N/A N/A Product Use: Wound Margin: N/A Distinct, outline attached Indistinct, nonvisible Granulation Amount: N/A None Present (0%) None Present (0%) Granulation Quality: N/A N/A N/A Necrotic Amount: N/A None Present (0%) None Present (0%) Exposed Structures: N/A Fascia: No Fascia: No Fat Layer (Subcutaneous Fat Layer (Subcutaneous Tissue) Exposed: No Tissue) Exposed: No Tendon: No Tendon: No Muscle: No Muscle: No Joint: No Joint: No Bone: No Bone: No Limited to Skin Limited to Skin Breakdown Breakdown Epithelialization: N/A Large (67-100%) Large (67-100%) Periwound Skin Texture: No Abnormalities Noted No Abnormalities Noted No Abnormalities Noted Periwound Skin No Abnormalities Noted No Abnormalities Noted No Abnormalities Noted Moisture: Periwound Skin Color: No Abnormalities Noted No Abnormalities Noted Rubor: Yes Temperature: N/A No Abnormality N/A Tenderness on No Yes No Palpation: Wound Preparation: N/A Ulcer Cleansing: Ulcer Cleansing: Not Rinsed/Irrigated with Cleansed Saline, Other: soap and water Topical Anesthetic Applied:  None Topical Anesthetic Applied: None Treatment Notes Electronic Signature(s) Signed: 03/21/2017 9:13:57 AM By: Christin Fudge MD, FACS Entered By: Christin Fudge on 03/21/2017 09:13:56 Brooke Hopkins ([id number]) -------------------------------------------------------------------------------- [place] Details Patient Name: AVELYN, TOUCH. Date of Service: 03/21/2017 8:45 AM Medical Record Number: [id number] Patient Account Number: [id number] Date of Birth/Sex: 25-May-1951  (66 y.o. Female) Treating RN: Ahmed Prima Primary Care Catherina Pates: FITZGERALD, DAVID Other Clinician: Referring Neftaly Swiss: FITZGERALD, DAVID Treating Latrice Storlie/Extender: Frann Rider in Treatment: 44 Active Inactive ` Orientation to the Wound Care Program Nursing Diagnoses: Knowledge deficit related to the wound healing center program Goals: Patient/caregiver will verbalize understanding of the [place] Date Initiated: 06/21/2016 Target Resolution Date: 03/29/2017 Goal Status: Active Interventions: Provide education on orientation to the wound center Notes: ` Wound/Skin Impairment Nursing Diagnoses: Impaired tissue integrity Goals: Patient/caregiver will verbalize understanding of skin care regimen Date Initiated: 06/21/2016 Target Resolution Date: 03/29/2017 Goal Status: Active Ulcer/skin breakdown will have a volume reduction of 30% by week 4 Date Initiated: 06/21/2016 Target Resolution Date: 03/29/2017 Goal Status: Active Ulcer/skin breakdown will have a volume reduction of 50% by week 8 Date Initiated: 06/21/2016 Target Resolution Date: 03/29/2017 Goal Status: Active Ulcer/skin breakdown will have a volume reduction of 80% by week 12 Date Initiated: 06/21/2016 Target Resolution Date: 03/29/2017 Goal Status: Active Ulcer/skin breakdown will heal within 14 weeks Date Initiated: 06/21/2016 Target Resolution Date: 03/29/2017 LAKEYTA, VANDENHEUVEL ([id number]) Goal Status: Active Interventions: Assess patient/caregiver ability to obtain necessary supplies Assess patient/caregiver ability to perform ulcer/skin care regimen upon admission and as needed Assess ulceration(s) every visit Provide education on ulcer and skin care Notes: Electronic Signature(s) Signed: 03/21/2017 4:34:11 PM By: Alric Quan Entered By: Alric Quan on 03/21/2017 09:01:30 Brooke Hopkins  ([id number]) -------------------------------------------------------------------------------- Pain Assessment Details Patient Name: Brooke Hopkins. Date of Service: 03/21/2017 8:45 AM Medical Record Number: [id number] Patient Account Number: [id number] Date of Birth/Sex: [birth year] (66 y.o. Female) Treating RN: Ahmed Prima Primary Care Teshawn Moan: FITZGERALD, DAVID Other Clinician: Referring Wilmarie Sparlin: FITZGERALD, DAVID Treating Joany Khatib/Extender: Frann Rider in Treatment: 29 Active Problems Location of Pain Severity and Description of Pain Patient Has Paino No Site Locations With Dressing Change: No Pain Management and Medication Current Pain Management: Electronic Signature(s) Signed: 03/21/2017 4:34:11 PM By: Alric Quan Entered By: Alric Quan on 03/21/2017 08:39:30 Brooke Hopkins ([id number]) -------------------------------------------------------------------------------- Patient/Caregiver Education Details Patient Name: JOHNITA, PALLESCHI. Date of Service: 03/21/2017 8:45 AM Medical Record Number: [id number] Patient Account Number: [id number] Date of Birth/Gender: 1951/06/09 (66 y.o. Female) Treating RN: Ahmed Prima Primary Care Physician: FITZGERALD, DAVID Other Clinician: Referring Physician: FITZGERALD, DAVID Treating Physician/Extender: Frann Rider in Treatment: 65 Education Assessment Education Provided To: Patient Education Topics Provided Wound/Skin Impairment: Handouts: Other: change dressing as ordered and keep wraps dry and clean Methods: Demonstration, Explain/Verbal Responses: State content correctly Electronic Signature(s) Signed: 03/21/2017 4:34:11 PM By: Alric Quan Entered By: Alric Quan on 03/21/2017 09:01:23 Brooke Hopkins ([id number]) -------------------------------------------------------------------------------- Wound Assessment Details Patient Name: RAEL, TILLY. Date of Service:  03/21/2017 8:45 AM Medical Record Number: [id number] Patient Account Number: [id number] Date of Birth/Sex: 11-06-1951 (66 y.o. Female) Treating RN: Ahmed Prima Primary Care Aailyah Dunbar: FITZGERALD, DAVID Other Clinician: Referring Kingson Lohmeyer: FITZGERALD, DAVID Treating Leia Coletti/Extender: Frann Rider in Treatment: 39 Wound Status Wound Number: 1 Primary Etiology: Lymphedema Wound Location: Left Lower Leg - Posterior Wound Status: Open Wounding Event: Gradually Appeared Date Acquired: 12/31/2015 Weeks Of Treatment: 39 Clustered Wound: No Photos Photo Uploaded By: Alric Quan on 03/21/2017  10:32:20 Wound Measurements Length: (cm) 0.1 Width: (cm) 0.1 Depth: (cm) 0.1 Area: (cm) 0.008 Volume: (cm) 0.001 % Reduction in Area: 100% % Reduction in Volume: 100% Epithelialization: Large (67-100%) Tunneling: No Undermining: No Wound Description Classification: Partial Thickness Wound Margin: Flat and Intact Exudate Amount: None Present Foul Odor After Cleansing: No Slough/Fibrino No Wound Bed Granulation Amount: Large (67-100%) Exposed Structure Granulation Quality: Red Fascia Exposed: No Necrotic Amount: Small (1-33%) Fat Layer (Subcutaneous Tissue) Exposed: No Necrotic Quality: Adherent Slough Tendon Exposed: No Muscle Exposed: No Joint Exposed: No ARLIENE, ROSENOW ([id number]) Bone Exposed: No Limited to Skin Breakdown Periwound Skin Texture Texture Color No Abnormalities Noted: No No Abnormalities Noted: No Callus: No Atrophie Blanche: No Crepitus: No Cyanosis: No Excoriation: No Ecchymosis: Yes Induration: No Erythema: No Rash: No Hemosiderin Staining: No Scarring: No Mottled: No Pallor: No Moisture Rubor: Yes No Abnormalities Noted: No Dry / Scaly: No Temperature / Pain Maceration: No Temperature: No Abnormality Tenderness on Palpation: Yes Wound Preparation Ulcer Cleansing: Other: soap and water, Topical Anesthetic Applied:  None Treatment Notes Wound #1 (Left, Posterior Lower Leg) 1. Cleansed with: Clean wound with Normal Saline Cleanse wound with antibacterial soap and water 2. Anesthetic Topical Lidocaine 4% cream to wound bed prior to debridement 3. Peri-wound Care: Moisturizing lotion 4. Dressing Applied: Prisma Ag 5. Secondary Dressing Applied ABD Pad 7. Secured with Tape 3 Layer Compression System - Bilateral Electronic Signature(s) Signed: 03/21/2017 4:34:11 PM By: Alric Quan Entered By: Alric Quan on 03/21/2017 08:54:07 Brooke Hopkins ([id number]) -------------------------------------------------------------------------------- Wound Assessment Details Patient Name: VICKYE, ASTORINO. Date of Service: 03/21/2017 8:45 AM Medical Record Number: [id number] Patient Account Number: [id number] Date of Birth/Sex: [birth year] (66 y.o. Female) Treating RN: Ahmed Prima Primary Care Annakate Soulier: FITZGERALD, DAVID Other Clinician: Referring Metzli Pollick: FITZGERALD, DAVID Treating Zailyn Thoennes/Extender: Frann Rider in Treatment: 39 Wound Status Wound Number: 2 Primary Etiology: Lymphedema Wound Location: Right Lower Leg - Posterior Wound Status: Healed - Epithelialized Wounding Event: Gradually Appeared Date Acquired: 12/31/2015 Weeks Of Treatment: 39 Clustered Wound: Yes Photos Photo Uploaded By: Alric Quan on 03/21/2017 10:32:20 Wound Measurements Length: (cm) 0 % Reduction Width: (cm) 0 % Reduction Depth: (cm) 0 Epitheliali Clustered Quantity: 3 Tunneling: Area: (cm) 0 Underminin Volume: (cm) 0 in Area: 100% in Volume: 100% zation: Large (67-100%) No g: No Wound Description Classification: Partial Thickness Wound Margin: Indistinct, nonvisible Exudate Amount: None Present Foul Odor After Cleansing: Yes Due to Product Use: No Slough/Fibrino Yes Wound Bed Granulation Amount: None Present (0%) Exposed Structure Necrotic Amount: None Present (0%) Fascia  Exposed: No Fat Layer (Subcutaneous Tissue) Exposed: No Tendon Exposed: No GLENNDA, WEATHERHOLTZ ([id number]) Muscle Exposed: No Joint Exposed: No Bone Exposed: No Limited to Skin Breakdown Periwound Skin Texture Texture Color No Abnormalities Noted: No No Abnormalities Noted: No Callus: No Atrophie Blanche: No Crepitus: No Cyanosis: No Excoriation: No Ecchymosis: No Induration: No Erythema: No Rash: No Hemosiderin Staining: No Scarring: No Mottled: No Pallor: No Moisture Rubor: No No Abnormalities Noted: No Dry / Scaly: Yes Temperature / Pain Maceration: No Temperature: No Abnormality Tenderness on Palpation: Yes Wound Preparation Ulcer Cleansing: Other: soap and water, Topical Anesthetic Applied: None Electronic Signature(s) Signed: 03/21/2017 4:34:11 PM By: Alric Quan Entered By: Alric Quan on 03/21/2017 09:07:53 Brooke Hopkins ([id number]) -------------------------------------------------------------------------------- Wound Assessment Details Patient Name: TIESHA, MARICH. Date of Service: 03/21/2017 8:45 AM Medical Record Number: [id number] Patient Account Number: [id number] Date of Birth/Sex: May 29, 1951 (66 y.o. Female) Treating RN: Ahmed Prima Primary Care Zriyah Kopplin: FITZGERALD,  DAVID Other Clinician: Referring Devlynn Knoff: FITZGERALD, DAVID Treating Evens Meno/Extender: Frann Rider in Treatment: 39 Wound Status Wound Number: 3 Primary Etiology: Pressure Ulcer Wound Location: Right Calcaneus Wound Status: Open Wounding Event: Pressure Injury Date Acquired: 01/24/2017 Weeks Of Treatment: 6 Clustered Wound: No Photos Photo Uploaded By: Alric Quan on 03/21/2017 10:32:53 Wound Measurements Length: (cm) 0.1 Width: (cm) 0.1 Depth: (cm) 0.1 Area: (cm) 0.008 Volume: (cm) 0.001 % Reduction in Area: 99.9% % Reduction in Volume: 99.9% Epithelialization: Large (67-100%) Tunneling: No Undermining: No Wound  Description Classification: Category/Stage II Wound Margin: Distinct, outline attached Exudate Amount: Large Exudate Type: Serosanguineous Exudate Color: red, brown Foul Odor After Cleansing: No Slough/Fibrino No Wound Bed Granulation Amount: None Present (0%) Exposed Structure Necrotic Amount: Large (67-100%) Fascia Exposed: No Necrotic Quality: Adherent Slough Fat Layer (Subcutaneous Tissue) Exposed: No Periwound Skin Texture ANESHA, HACKERT ([id number]) Texture Color No Abnormalities Noted: No No Abnormalities Noted: No Moisture Temperature / Pain No Abnormalities Noted: No Temperature: No Abnormality Maceration: Yes Tenderness on Palpation: Yes Wound Preparation Ulcer Cleansing: Rinsed/Irrigated with Saline Topical Anesthetic Applied: None Treatment Notes Wound #3 (Right Calcaneus) 1. Cleansed with: Clean wound with Normal Saline Cleanse wound with antibacterial soap and water 2. Anesthetic Topical Lidocaine 4% cream to wound bed prior to debridement 3. Peri-wound Care: Moisturizing lotion 4. Dressing Applied: Prisma Ag 5. Secondary Dressing Applied Dry Gauze 7. Secured with Tape 3 Layer Compression System - Bilateral Notes heel cup Electronic Signature(s) Signed: 03/21/2017 4:34:11 PM By: Alric Quan Entered By: Alric Quan on 03/21/2017 08:55:35 Brooke Hopkins ([id number]) -------------------------------------------------------------------------------- Wound Assessment Details Patient Name: BRIANNIE, GUTIERREZ. Date of Service: 03/21/2017 8:45 AM Medical Record Number: [id number] Patient Account Number: [id number] Date of Birth/Sex: 07-04-1951 (66 y.o. Female) Treating RN: Ahmed Prima Primary Care Tamera Pingley: FITZGERALD, DAVID Other Clinician: Referring Ronnita Paz: FITZGERALD, DAVID Treating Loreal Schuessler/Extender: Frann Rider in Treatment: 39 Wound Status Wound Number: 4 Primary Etiology: Trauma, Other Wound Location: Left,  Anterior Lower Leg Wound Status: Healed - Epithelialized Wounding Event: Trauma Date Acquired: 02/24/2017 Weeks Of Treatment: 3 Clustered Wound: No Photos Photo Uploaded By: Alric Quan on 03/21/2017 10:32:54 Wound Measurements Length: (cm) 0 % Reduction i Width: (cm) 0 % Reduction i Depth: (cm) 0 Area: (cm) 0 Volume: (cm) 0 n Area: 100% n Volume: 100% Wound Description Classification: Partial Thickness Periwound Skin Texture Texture Color No Abnormalities Noted: No No Abnormalities Noted: No Moisture No Abnormalities Noted: No Electronic Signature(s) Signed: 03/21/2017 4:34:11 PM By: Tedra Coupe ([id number]) Entered By: Alric Quan on 03/21/2017 08:55:48 Brooke Hopkins ([id number]) -------------------------------------------------------------------------------- Wound Assessment Details Patient Name: KENNETHA, PEARMAN. Date of Service: 03/21/2017 8:45 AM Medical Record Number: [id number] Patient Account Number: [id number] Date of Birth/Sex: November 02, 1951 (66 y.o. Female) Treating RN: Ahmed Prima Primary Care Allyson Tineo: FITZGERALD, DAVID Other Clinician: Referring Tachina Spoonemore: FITZGERALD, DAVID Treating Tifani Dack/Extender: Frann Rider in Treatment: 39 Wound Status Wound Number: 5 Primary Etiology: Trauma, Other Wound Location: Right Lower Leg - Anterior Wound Status: Healed - Epithelialized Wounding Event: Trauma Date Acquired: 03/07/2017 Weeks Of Treatment: 2 Clustered Wound: No Photos Photo Uploaded By: Alric Quan on 03/21/2017 10:33:23 Wound Measurements Length: (cm) Width: (cm) Depth: (cm) Area: (cm) Volume: (cm) 0 % Reduction in Area: 100% 0 % Reduction in Volume: 100% 0 Epithelialization: Large (67-100%) 0 Tunneling: No 0 Undermining: No Wound Description Classification: Partial Thickness Wound Margin: Distinct, outline attached Exudate Amount: None Present Foul Odor After Cleansing:  No Slough/Fibrino No Wound Bed Granulation Amount: None Present (0%)  Exposed Structure Necrotic Amount: None Present (0%) Fascia Exposed: No Fat Layer (Subcutaneous Tissue) Exposed: No Tendon Exposed: No Muscle Exposed: No Joint Exposed: No MANDI, MATTIOLI ([id number]) Bone Exposed: No Limited to Skin Breakdown Periwound Skin Texture Texture Color No Abnormalities Noted: No No Abnormalities Noted: No Moisture Temperature / Pain No Abnormalities Noted: No Temperature: No Abnormality Tenderness on Palpation: Yes Wound Preparation Ulcer Cleansing: Rinsed/Irrigated with Saline, Other: soap and water, Topical Anesthetic Applied: None Electronic Signature(s) Signed: 03/21/2017 4:34:11 PM By: Alric Quan Entered By: Alric Quan on 03/21/2017 09:09:15 Brooke Hopkins ([id number]) -------------------------------------------------------------------------------- Wound Assessment Details Patient Name: LILO, WALLINGTON. Date of Service: 03/21/2017 8:45 AM Medical Record Number: [id number] Patient Account Number: [id number] Date of Birth/Sex: 29-Apr-1951 (66 y.o. Female) Treating RN: Ahmed Prima Primary Care Sylvester Salonga: FITZGERALD, DAVID Other Clinician: Referring Pride Gonzales: FITZGERALD, DAVID Treating Fredrich Cory/Extender: Frann Rider in Treatment: 39 Wound Status Wound Number: 6 Primary Etiology: Pressure Ulcer Wound Location: Left Toe Great Wound Status: Healed - Epithelialized Wounding Event: Gradually Appeared Date Acquired: 02/28/2017 Weeks Of Treatment: 1 Clustered Wound: No Photos Photo Uploaded By: Alric Quan on 03/21/2017 10:33:24 Wound Measurements Length: (cm) 0 % Reduction i Width: (cm) 0 % Reduction i Depth: (cm) 0 Epithelializa Area: (cm) 0 Tunneling: Volume: (cm) 0 Undermining: n Area: 100% n Volume: 100% tion: Large (67-100%) No No Wound Description Classification: Category/Stage II Foul Odor Aft Wound Margin: Indistinct,  nonvisible Slough/Fibrin Exudate Amount: None Present er Cleansing: No o No Wound Bed Granulation Amount: None Present (0%) Exposed Structure Necrotic Amount: None Present (0%) Fascia Exposed: No Fat Layer (Subcutaneous Tissue) Exposed: No Tendon Exposed: No Muscle Exposed: No Joint Exposed: No LATAISHA, COLAN ([id number]) Bone Exposed: No Limited to Skin Breakdown Periwound Skin Texture Texture Color No Abnormalities Noted: No No Abnormalities Noted: No Rubor: Yes Moisture No Abnormalities Noted: No Wound Preparation Ulcer Cleansing: Not Cleansed Topical Anesthetic Applied: None Electronic Signature(s) Signed: 03/21/2017 4:34:11 PM By: Alric Quan Entered By: Alric Quan on 03/21/2017 09:08:39 Brooke Hopkins ([id number]) -------------------------------------------------------------------------------- Vitals Details Patient Name: TANETTE, CHAUCA. Date of Service: 03/21/2017 8:45 AM Medical Record Number: [id number] Patient Account Number: [id number] Date of Birth/Sex: [birth year] (66 y.o. Female) Treating RN: Ahmed Prima Primary Care Taila Basinski: FITZGERALD, DAVID Other Clinician: Referring Kimbra Marcelino: FITZGERALD, DAVID Treating Daisia Slomski/Extender: Frann Rider in Treatment: 70 Vital Signs Time Taken: 08:39 Temperature (F): 97.7 Height (in): 67 Pulse (bpm): 83 Weight (lbs): 300 Respiratory Rate (breaths/min): 18 Body Mass Index (BMI): 47 Blood Pressure (mmHg): 185/75 Reference Range: 80 - 120 mg / dl Electronic Signature(s) Signed: 03/21/2017 4:34:11 PM By: Alric Quan Entered By: Alric Quan on 03/21/2017 08:42:48

## 2017-03-28 ENCOUNTER — Encounter: Payer: 59 | Admitting: Surgery

## 2017-03-28 DIAGNOSIS — L97222 Non-pressure chronic ulcer of left calf with fat layer exposed: Secondary | ICD-10-CM | POA: Diagnosis not present

## 2017-03-28 DIAGNOSIS — L97419 Non-pressure chronic ulcer of right heel and midfoot with unspecified severity: Secondary | ICD-10-CM | POA: Diagnosis not present

## 2017-03-28 DIAGNOSIS — I890 Lymphedema, not elsewhere classified: Secondary | ICD-10-CM | POA: Diagnosis not present

## 2017-03-28 DIAGNOSIS — L97229 Non-pressure chronic ulcer of left calf with unspecified severity: Secondary | ICD-10-CM | POA: Diagnosis not present

## 2017-03-28 DIAGNOSIS — L97212 Non-pressure chronic ulcer of right calf with fat layer exposed: Secondary | ICD-10-CM | POA: Diagnosis not present

## 2017-03-28 DIAGNOSIS — Z87891 Personal history of nicotine dependence: Secondary | ICD-10-CM | POA: Diagnosis not present

## 2017-03-28 DIAGNOSIS — E11622 Type 2 diabetes mellitus with other skin ulcer: Secondary | ICD-10-CM | POA: Diagnosis not present

## 2017-03-31 NOTE — Progress Notes (Signed)
Brooke Hopkins ([id number]) Visit Report for 03/28/2017 Chief Complaint Document Details Patient Name: Brooke Hopkins, Brooke Hopkins. Date of Service: 03/28/2017 8:45 AM Medical Record Number: [id number] Patient Account Number: [id number] Date of Birth/Sex: 12-01-1951 (66 y.o. Female) Treating RN: Ahmed Prima Primary Care Provider: FITZGERALD, DAVID Other Clinician: Referring Provider: FITZGERALD, DAVID Treating Provider/Extender: Frann Rider in Treatment: 35 Information Obtained from: Patient Chief Complaint Brooke Hopkins returns for follow-up to her bilateral lower extremity ulcers Electronic Signature(s) Signed: 03/28/2017 9:12:28 AM By: Christin Fudge MD, FACS Entered By: Christin Fudge on 03/28/2017 09:12:28 Brooke Hopkins ([id number]) -------------------------------------------------------------------------------- HPI Details Patient Name: Brooke Hopkins, Brooke Hopkins. Date of Service: 03/28/2017 8:45 AM Medical Record Number: [id number] Patient Account Number: [id number] Date of Birth/Sex: 1951/05/31 (66 y.o. Female) Treating RN: Ahmed Prima Primary Care Provider: FITZGERALD, DAVID Other Clinician: Referring Provider: FITZGERALD, DAVID Treating Provider/Extender: Frann Rider in Treatment: 40 History of Present Illness Location: massive swelling of both lower extremities and ulceration both lower extremities Quality: Patient reports experiencing a dull pain to affected area(s). Severity: Patient states wound are getting worse. Duration: Patient has had the wound for >2 years prior to seeking treatment at the wound center Timing: Pain in wound is constant (hurts all the time) Context: The wound appeared gradually over time Modifying Factors: Other treatment(s) tried include:she has a lymphedema pump but uses it seldom and she's had several course of antibiotics Associated Signs and Symptoms: Patient reports having difficulty standing for long periods. HPI Description:  66 year old patient seen by Dr. Ola Spurr of infectious disease who has been following up for left lower extremity cellulitis and ulcer with recurrent bilateral lower extremity problems for several months. Recently she had a large right lower extremity bullae which opened out and has been ulcerated. She has seen the vascular group and has been getting Unna's wraps and has a lymphedema pump used in the past. Her prior cultures were positive for Pseudomonas, Proteus and was treated with amoxicillin. He has also been treated with 2 weeks course of ciprofloxacin and amoxicillin.. Increase of Lasix dose helped with the edema and echo showed no systolic CHF but may be diastolic problems. Past medical history significant for diabetes mellitus type 2, venous stasis ulcer, obesity, diabetic peripheral neuropathy, status post back surgery, cholecystectomy, hysterectomy, arthroscopy of the knee. He is a former smoker and quit smoking in 1984. The patient has seen Dr. Delana Meyer who did not recommend any arterial or venous duplex studies and has been using Unna's wraps and also recommended a lymphedema pump. she has been very noncompliant with using these. 06/28/2016 -- the patient is still on antibiotics as prescribed by Dr. Ola Spurr and he is asked her to take it for 3 weeks. The patient also says she has a lot of redness and pain in the folds of her thigh and lower extremity and this is very painful. 07/26/2016 -- the patient is off antibiotics and has been getting dressing changes 3 times a week. 08/09/2016 -- is been on Cipro and is taking potassium supplements along with her Lasix and is going to be seeing Dr. Ola Spurr for a consult return visit only in November 2017. 08/23/2016 -- she was admitted to the hospital last week and I have reviewed these reports in detail. She was seen by Dr. Ola Spurr who noted a Pseudomonas infection which was resistant to ciprofloxacin and discharged her on  Ceftazidime 1 g IV every 12 hourly anyone days. The antibiotic was to be stopped on September 11 and he would see her  back in the clinic. 08/30/2016 -- had a communication from Dr. Ola Spurr that he would extend her antibiotics by a week if she continued to look like cellulitis was persisting. Brooke Hopkins, Brooke Hopkins ([id number]) 09/13/2016 -- the PICC line is out and antibiotics have stopped. 10/04/16: returns today for f/u. reports utilizes compression pump twice daily. she is compliant with her compression therapy. she reports a new blister on the right proximal posterior calf. no systemic s/s of infection. 10/11/16: returns today for f/u. she reports adherence to her compression pumps, but there is no improvement regarding her BLE edema. there is weeping of fluid. denies fever, chills, body aches or malaise. 10/18/2016 -- she has a significant cellulitis of her right lower extremity which was not there last week. I believe at this stage she will need the expertise of Dr. Ola Spurr to decide on antibiotic coverage. 10/25/2016 -- the patient has had cipro and ampicillin been started by Dr. Ola Spurr and he is awaiting further cultures. Overall she's been feeling better. 11/08/2016 -- Dr. Ola Spurr this week who has continued with ciprofloxacin and amoxicillin and is awaiting further cultures before deciding to place her on a PICC line. 11/15/2016 -- Dr. Blane Ohara note was reviewed and her cultures growing Pseudomonas in both legs which is sensitive to Cipro and the other is resistant to Cipro and she would get a PICC line and start IV antibiotics -- Ceftazidime 1 g q 12 for 3 weeks. 11-29-16 Brooke Hopkins presents today in follow-up of her bilateral lower exterminators ulcers. She remains on IV antibiotic therapy via a PICC line per Dr. Ola Spurr; she has a follow-up appointment with him on Monday, 12/02/2016. She anticipates that the about therapy will be discontinued at that appointment.  She denies any complications of nausea vomiting and/or diarrhea accompanied by this antibiotic therapy. She admits to using her lymphedema pumps twice daily with the exception of yesterday. She denies any concerns or complications with the current treatment plan. 12/06/2016 -- Dr. Ola Spurr saw her recently on 12/02/2016 and due to the fact she has had significant problems with recurrent ulceration and Pseudomonas infection he recommended 3 more weeks of antibiotics and extended it until December 25. The PICC line will be in place. Last hemoglobin A1c on November 30 was 9% 12/27/2016 -- because of the holidays the patient's diet has been higher in salt, her dressings have not been done as required and she is not used to lymphedema pumps appropriately. This has led to her lymphedema increasing markedly. 01/24/2017 -- the patient's hospital bed is malfunctioning and hence her legs have been flat in bed and her lymphedema is increased markedly. During my examination I also noted a unstageable pressure injury to her right heel 02/07/2017 -- she has returned after 2 weeks as she was done with influenza and has been in bed a lot, with a new bed which has helped her reduce the edema due to proper elevation. Will she has a full-fledged large pressure injury to the right heel. 02/28/2017 -- hilar dressing was being taken down with scissors she had a sharp injury to the left medial calf area which is fairly superficial. Electronic Signature(s) Signed: 03/28/2017 9:12:32 AM By: Christin Fudge MD, FACS Entered By: Christin Fudge on 03/28/2017 09:12:32 Brooke Hopkins ([id number]) -------------------------------------------------------------------------------- Physical Exam Details Patient Name: Brooke Hopkins, Brooke Hopkins. Date of Service: 03/28/2017 8:45 AM Medical Record Number: [id number] Patient Account Number: [id number] Date of Birth/Sex: Dec 05, 1951 (66 y.o. Female) Treating RN: Ahmed Prima Primary  Care Provider: FITZGERALD, DAVID Other  Clinician: Referring Provider: FITZGERALD, DAVID Treating Provider/Extender: Frann Rider in Treatment: 40 Constitutional . Pulse regular. Respirations normal and unlabored. Afebrile. . Eyes Nonicteric. Reactive to light. Ears, Nose, Mouth, and Throat Lips, teeth, and gums WNL.Marland Kitchen Moist mucosa without lesions. Neck supple and nontender. No palpable supraclavicular or cervical adenopathy. Normal sized without goiter. Respiratory WNL. No retractions.. Breath sounds WNL, No rubs, rales, rhonchi, or wheeze.. Cardiovascular Heart rhythm and rate regular, no murmur or gallop.. Pedal Pulses WNL. No clubbing, cyanosis or edema. Chest Breasts symmetical and no nipple discharge.. Breast tissue WNL, no masses, lumps, or tenderness.. Gastrointestinal (GI) Abdomen without masses or tenderness.. No liver or spleen enlargement or tenderness.. Genitourinary (GU) No hydrocele, spermatocele, tenderness of the cord, or testicular mass.Marland Kitchen Penis without lesions.Lowella Fairy without lesions. No cystocele, or rectocele. Pelvic support intact, no discharge.Marland Kitchen Urethra without masses, tenderness or scarring.Marland Kitchen Lymphatic No adneopathy. No adenopathy. No adenopathy. Musculoskeletal Adexa without tenderness or enlargement.. Digits and nails w/o clubbing, cyanosis, infection, petechiae, ischemia, or inflammatory conditions.. Integumentary (Hair, Skin) No suspicious lesions. No crepitus or fluctuance. No peri-wound warmth or erythema. No masses.Marland Kitchen Psychiatric Judgement and insight Intact.. No evidence of depression, anxiety, or agitation.. Notes on review today the wounds on both lower extremities have healed and there is no evidence of excessive lymphedema or weeping. The right heel wound is also healed. Electronic Signature(s) VERLAINE, EMBRY ([id number]) Signed: 03/28/2017 9:12:56 AM By: Christin Fudge MD, FACS Entered By: Christin Fudge on 03/28/2017  09:12:56 RHILYN, BATTLE ([id number]) -------------------------------------------------------------------------------- Physician Orders Details Patient Name: ANAGHA, LOSEKE. Date of Service: 03/28/2017 8:45 AM Medical Record Number: [id number] Patient Account Number: [id number] Date of Birth/Sex: 1951/10/21 (66 y.o. Female) Treating RN: Ahmed Prima Primary Care Provider: FITZGERALD, DAVID Other Clinician: Referring Provider: FITZGERALD, DAVID Treating Provider/Extender: Frann Rider in Treatment: 19 Verbal / Phone Orders: Yes Clinician: Carolyne Fiscal, Debi Read Back and Verified: Yes Diagnosis Coding Wound Cleansing o Cleanse wound with mild soap and water o May Shower, gently pat wound dry prior to applying new dressing. Follow-up Appointments o Return Appointment in 1 week. Edema Control o Other: - pt to wear own compression wraps first thing in the morning and take off before bed and before shower. [place] Visits - right now we are monitoring pt with her compression wraps please be on stand by until we discharge her we are going to see her again next Friday please see her one day this week and monitor her for drainage and swelling. o Home Health Nurse may visit PRN to address patientos wound care needs. o FACE TO FACE ENCOUNTER: MEDICARE and MEDICAID PATIENTS: I certify that this patient is under my care and that I had a face-to-face encounter that meets the physician face-to-face encounter requirements with this patient on this date. The encounter with the patient was in whole or in part for the following MEDICAL CONDITION: (primary reason for [place]) MEDICAL NECESSITY: I certify, that based on my findings, NURSING services are a medically necessary home health service. HOME BOUND STATUS: I certify that my clinical findings support that this patient is homebound (i.e., Due to illness or injury, pt requires aid  of supportive devices such as crutches, cane, wheelchairs, walkers, the use of special transportation or the assistance of another person to leave their place of residence. There is a normal inability to leave the home and doing so requires considerable and taxing effort. Other absences are for medical reasons / religious services and are  infrequent or of short duration when for other reasons). o If current dressing causes regression in wound condition, may D/C ordered dressing product/s and apply Normal Saline Moist Dressing daily until next [place] / Other MD appointment. [place] of regression in wound condition at [phone]. o Please direct any NON-WOUND related issues/requests for orders to patient's Primary Care Physician Electronic Signature(s) Signed: 03/28/2017 4:07:16 PM By: Christin Fudge MD, FACS Signed: 03/31/2017 4:13:19 PM By: Alric Quan Entered By: Alric Quan on 03/28/2017 09:01:55 Brooke Hopkins, EBRAHIM ([id number], Brooke Hopkins ([id number]) -------------------------------------------------------------------------------- Problem List Details Patient Name: Brooke Hopkins, Brooke Hopkins. Date of Service: 03/28/2017 8:45 AM Medical Record Number: [id number] Patient Account Number: [id number] Date of Birth/Sex: 04/07/1951 (66 y.o. 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 Date of Service: 03/28/2017 8:45 AM Medical Record Number: [id number] Patient Account Number: [id number] Date of Birth/Sex: 1951/02/21 (66 y.o. Female) Treating RN: Ahmed Prima Primary Care Provider: FITZGERALD, DAVID Other Clinician: Referring Provider: FITZGERALD, DAVID Treating Provider/Extender: Frann Rider in Treatment: 40 Subjective Chief Complaint Information obtained from Patient Mrs. Games returns for follow-up to her bilateral lower extremity ulcers History of Present Illness (HPI) The following HPI elements were documented for the patient's wound: Location: massive swelling of both lower extremities and ulceration both lower extremities Quality: Patient reports experiencing a dull pain to affected area(s). Severity: Patient states wound are getting worse. Duration: Patient has had the wound for >2 years prior to seeking treatment at the wound center Timing: Pain in wound is constant (hurts all the time) Context: The wound appeared gradually over time Modifying Factors: Other treatment(s) tried include:she has a lymphedema pump but uses it seldom and she's had several course of antibiotics Associated Signs and Symptoms: Patient reports having difficulty standing for long periods. 66 year old patient seen by Dr. Ola Spurr of infectious disease who has been following up for left lower extremity cellulitis and ulcer with recurrent bilateral lower extremity problems for several months. Recently she had a large right lower extremity bullae which  opened out and has been ulcerated. She has seen the vascular group and has been getting Unna's wraps and has a lymphedema pump used in the past. Her prior cultures were positive for Pseudomonas, Proteus and was treated with amoxicillin. He has also been treated with 2 weeks course of ciprofloxacin and amoxicillin.. Increase of Lasix dose helped with the edema and echo showed no systolic CHF but may be diastolic problems. Past medical history significant for diabetes mellitus type 2, venous stasis ulcer, obesity, diabetic peripheral neuropathy, status post back surgery, cholecystectomy, hysterectomy, arthroscopy of the knee. He is a former smoker and quit smoking in 1984. The patient has seen Dr. Delana Meyer who did not recommend any arterial or venous duplex studies and has been  using Unna's wraps and also recommended a lymphedema pump. she has been very noncompliant with using these. 06/28/2016 -- the patient is still on antibiotics as prescribed by Dr. Ola Spurr and he is asked her to take it for 3 weeks. The patient also says she has a lot of redness and pain in the folds of her thigh and lower extremity and this is very painful. 07/26/2016 -- the patient is off antibiotics and has been getting dressing changes 3 times a week. 08/09/2016 -- is been on Cipro and is taking potassium supplements along with her Lasix and is going to be seeing Dr. Ola Spurr for a consult return visit only in November 2017. Brooke Hopkins, Brooke Hopkins ([id number]) 08/23/2016 -- she was admitted to the hospital last week and I have reviewed these reports in detail. She was seen by Dr. Ola Spurr who noted a Pseudomonas infection which was resistant to ciprofloxacin and discharged her on Ceftazidime 1 g IV every 12 hourly anyone days. The antibiotic was to be stopped on September 11 and he would see her back in the clinic. 08/30/2016 -- had a communication from Dr. Ola Spurr that he would extend her antibiotics by a week if  she continued to look like cellulitis was persisting. 09/13/2016 -- the PICC line is out and antibiotics have stopped. 10/04/16: returns today for f/u. reports utilizes compression pump twice daily. she is compliant with her compression therapy. she reports a new blister on the right proximal posterior calf. no systemic s/s of infection. 10/11/16: returns today for f/u. she reports adherence to her compression pumps, but there is no improvement regarding her BLE edema. there is weeping of fluid. denies fever, chills, body aches or malaise. 10/18/2016 -- she has a significant cellulitis of her right lower extremity which was not there last week. I believe at this stage she will need the expertise of Dr. Ola Spurr to decide on antibiotic coverage. 10/25/2016 -- the patient has had cipro and ampicillin been started by Dr. Ola Spurr and he is awaiting further cultures. Overall she's been feeling better. 11/08/2016 -- Dr. Ola Spurr this week who has continued with ciprofloxacin and amoxicillin and is awaiting further cultures before deciding to place her on a PICC line. 11/15/2016 -- Dr. Blane Ohara note was reviewed and her cultures growing Pseudomonas in both legs which is sensitive to Cipro and the other is resistant to Cipro and she would get a PICC line and start IV antibiotics -- Ceftazidime 1 g q 12 for 3 weeks. 11-29-16 Mrs. Michaelson presents today in follow-up of her bilateral lower exterminators ulcers. She remains on IV antibiotic therapy via a PICC line per Dr. Ola Spurr; she has a follow-up appointment with him on Monday, 12/02/2016. She anticipates that the about therapy will be discontinued at that appointment. She denies any complications of nausea vomiting and/or diarrhea accompanied by this antibiotic therapy. She admits to using her lymphedema pumps twice daily with the exception of yesterday. She denies any concerns or complications with the current treatment  plan. 12/06/2016 -- Dr. Ola Spurr saw her recently on 12/02/2016 and due to the fact she has had significant problems with recurrent ulceration and Pseudomonas infection he recommended 3 more weeks of antibiotics and extended it until December 25. The PICC line will be in place. Last hemoglobin A1c on November 30 was 9% 12/27/2016 -- because of the holidays the patient's diet has been higher in salt, her dressings have not been done as required and she is not used to lymphedema pumps appropriately. This has led  to her lymphedema increasing markedly. 01/24/2017 -- the patient's hospital bed is malfunctioning and hence her legs have been flat in bed and her lymphedema is increased markedly. During my examination I also noted a unstageable pressure injury to her right heel 02/07/2017 -- she has returned after 2 weeks as she was done with influenza and has been in bed a lot, with a new bed which has helped her reduce the edema due to proper elevation. Will she has a full-fledged large pressure injury to the right heel. 02/28/2017 -- hilar dressing was being taken down with scissors she had a sharp injury to the left medial calf area which is fairly superficial. Brooke Hopkins, Brooke Hopkins. ([id number]) Objective Constitutional Pulse regular. Respirations normal and unlabored. Afebrile. Vitals Time Taken: 8:48 AM, Height: 67 in, Weight: 300 lbs, BMI: 47, Temperature: 97.8 F, Pulse: 77 bpm, Respiratory Rate: 18 breaths/min, Blood Pressure: 171/70 mmHg. Eyes Nonicteric. Reactive to light. Ears, Nose, Mouth, and Throat Lips, teeth, and gums WNL.Marland Kitchen Moist mucosa without lesions. Neck supple and nontender. No palpable supraclavicular or cervical adenopathy. Normal sized without goiter. Respiratory WNL. No retractions.. Breath sounds WNL, No rubs, rales, rhonchi, or wheeze.. Cardiovascular Heart rhythm and rate regular, no murmur or gallop.. Pedal Pulses WNL. No clubbing, cyanosis or  edema. Chest Breasts symmetical and no nipple discharge.. Breast tissue WNL, no masses, lumps, or tenderness.. Gastrointestinal (GI) Abdomen without masses or tenderness.. No liver or spleen enlargement or tenderness.. Genitourinary (GU) No hydrocele, spermatocele, tenderness of the cord, or testicular mass.Marland Kitchen Penis without lesions.Lowella Fairy without lesions. No cystocele, or rectocele. Pelvic support intact, no discharge.Marland Kitchen Urethra without masses, tenderness or scarring.Marland Kitchen Lymphatic No adneopathy. No adenopathy. No adenopathy. Musculoskeletal Adexa without tenderness or enlargement.. Digits and nails w/o clubbing, cyanosis, infection, petechiae, ischemia, or inflammatory conditions.Marland Kitchen Psychiatric Judgement and insight Intact.. No evidence of depression, anxiety, or agitation.. General Notes: on review today the wounds on both lower extremities have healed and there is no evidence Brooke Hopkins, Brooke Hopkins. ([id number]) of excessive lymphedema or weeping. The right heel wound is also healed. Integumentary (Hair, Skin) No suspicious lesions. No crepitus or fluctuance. No peri-wound warmth or erythema. No masses.. Wound #1 status is Healed - Epithelialized. Original cause of wound was Gradually Appeared. The wound is located on the Left,Posterior Lower Leg. The wound measures 0cm length x 0cm width x 0cm depth; 0cm^2 area and 0cm^3 volume. There is no tunneling or undermining noted. There is a none present amount of drainage noted. There is no granulation within the wound bed. There is no necrotic tissue within the wound bed. Wound #3 status is Healed - Epithelialized. Original cause of wound was Pressure Injury. The wound is located on the Right Calcaneus. The wound measures 0cm length x 0cm width x 0cm depth; 0cm^2 area and 0cm^3 volume. There is no tunneling or undermining noted. There is a none present amount of drainage noted. There is no granulation within the wound bed. There is no necrotic  tissue within the wound bed. 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 I would like to  transition her to juxta lites and have discussed at great lengths that she should be wearing this the entire day, starting first thing in the morning, up until the time she is in bed. Elevation in the bed has also been discussed and elevation and exercise in general has been discussed. She will also continue using her lymphedema pumps. I have asked her to monitor the situation closely including watching her diet, salt intake, trying to lose weight and appropriate use of her lymphedema pumps. If during this coming week she begins excessive weeping I'll follow her extremities or increase in her lymphedema she should immediately call as are her home health nurse to reapply a compression wrap. She and her son both understand the treatment plan NAKEESHA, Brooke Hopkins. ([id number]) Plan Wound Cleansing: Cleanse wound with mild soap and water May Shower, gently pat wound dry prior to applying new dressing. Follow-up Appointments: Return Appointment in 1 week. Edema Control: Other: - pt to wear own compression wraps first thing in the morning and take off before bed and before shower. Home Health: [place] Visits - right now we are monitoring pt with her compression wraps please be on stand by until we discharge her we are going to see her again next Friday please see her one day this week and monitor her for drainage and swelling. Home Health  Nurse may visit PRN to address patient s wound care needs. FACE TO FACE ENCOUNTER: MEDICARE and MEDICAID PATIENTS: I certify that this patient is under my care and that I had a face-to-face encounter that meets the physician face-to-face encounter requirements with this patient on this date. The encounter with the patient was in whole or in part for the following MEDICAL CONDITION: (primary reason for [place]) MEDICAL NECESSITY: I certify, that based on my findings, NURSING services are a medically necessary home health service. HOME BOUND STATUS: I certify that my clinical findings support that this patient is homebound (i.e., Due to illness or injury, pt requires aid of supportive devices such as crutches, cane, wheelchairs, walkers, the use of special transportation or the assistance of another person to leave their place of residence. There is a normal inability to leave the home and doing so requires considerable and taxing effort. Other absences are for medical reasons / religious services and are infrequent or of short duration when for other reasons). If current dressing causes regression in wound condition, may D/C ordered dressing product/s and apply Normal Saline Moist Dressing daily until next [place] / Other MD appointment. [place] of regression in wound condition at [phone]. Please direct any NON-WOUND related issues/requests for orders to patient's Primary Care Physician At this stage we have had a long discussion regarding maintenance of her progress and good control of her lymphedema. I would like to transition her to juxta lites and have discussed at great lengths that she should be wearing this the entire day, starting first thing in the morning, up until the time she is in bed. Elevation in the bed has also been discussed and elevation and exercise in general has been discussed. She will also continue using her lymphedema pumps. I  have asked her to monitor the situation closely including watching her diet, salt intake, trying to lose weight and appropriate use of her lymphedema pumps. If during this coming week she begins excessive weeping I'll follow her extremities or increase in her lymphedema she should immediately call as are her home health nurse to  reapply a compression wrap. She and her son both understand the treatment plan TALEIGH, GERO ([id number]) Electronic Signature(s) Signed: 03/28/2017 9:15:41 AM By: Christin Fudge MD, FACS Entered By: Christin Fudge on 03/28/2017 09:15:41 LACREASHA, HINDS ([id number]) -------------------------------------------------------------------------------- SuperBill Details Patient Name: MILESSA, HOGAN. Date of Service: 03/28/2017 Medical Record Number: [id number] Patient Account Number: [id number] Date of Birth/Sex: 1951/08/24 (66 y.o. 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 E66.01 Morbid (severe) obesity due to excess calories L89.612 Pressure ulcer of right heel, stage 2 Facility Procedures CPT4 Code: 86825749 Description: 99213 - WOUND CARE VISIT-LEV 3 EST PT Modifier: Quantity: 1 Physician Procedures CPT4 Code: 3552174 Description: 99213 - WC PHYS LEVEL 3 - EST PT ICD-10 Description Diagnosis E11.622 Type 2 diabetes mellitus with other skin ulcer I89.0 Lymphedema, not elsewhere classified L97.222 Non-pressure chronic ulcer of left calf with fat 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 Modifier:  layer exposed layer exposed Quantity: 1 Electronic Signature(s) Signed: 03/28/2017 4:07:16 PM By: Christin Fudge MD, FACS Signed: 03/31/2017 4:13:19 PM By: Alric Quan Previous Signature: 03/28/2017 9:15:55 AM Version By: Christin Fudge MD, FACS Entered By: Alric Quan on 03/28/2017 09:22:00

## 2017-03-31 NOTE — Progress Notes (Signed)
Brooke Hopkins, Brooke Hopkins ([id number]) Visit Report for 03/28/2017 Arrival Information Details Patient Name: Brooke Hopkins, Brooke Hopkins. Date of Service: 03/28/2017 8:45 AM Medical Record Number: [id number] Patient Account Number: [id number] Date of Birth/Sex: May 05, 1951 (66 y.o. Female) Treating RN: Brooke Hopkins Primary Care Brooke Hopkins, DAVID Other Clinician: Referring Brooke Hopkins, DAVID Treating Brooke Hopkins in Treatment: 46 Visit Information History Since Last Visit All ordered tests and consults were completed: No Patient Arrived: Wheel Chair Added or deleted any medications: No Arrival Time: 08:37 Any new allergies or adverse reactions: No Accompanied By: son Had a fall or experienced change in No Transfer Assistance: [employer] activities of daily living that may affect Patient Lift risk of falls: Patient Identification Verified: Yes Signs or symptoms of abuse/neglect since last No Secondary Verification Process Yes visito Completed: Hospitalized since last visit: No Patient Requires Transmission- No Has Dressing in Place as Prescribed: Yes Based Precautions: Has Compression in Place as Prescribed: Yes Patient Has Alerts: No Pain Present Now: No Electronic Signature(s) Signed: 03/31/2017 4:13:19 PM By: Brooke Hopkins Entered By: Brooke Hopkins on 03/28/2017 08:40:06 Brooke Hopkins ([id number]) -------------------------------------------------------------------------------- Clinic Level of Care Assessment Details Patient Name: Brooke Hopkins. Date of Service: 03/28/2017 8:45 AM Medical Record Number: [id number] Patient Account Number: [id number] Date of Birth/Sex: 10-06-1951 (66 y.o. Female) Treating RN: Brooke Hopkins Primary Care Alter Moss: Hopkins, DAVID Other Clinician: Referring Brooke Hopkins, DAVID Treating [name] Mowers/Extender: Brooke Hopkins in Treatment: 40 Clinic Level of Care Assessment Items TOOL 4  Quantity Score X - Use when only an EandM is performed on FOLLOW-UP visit 1 0 ASSESSMENTS - Nursing Assessment / Reassessment X - Reassessment of Co-morbidities (includes updates in patient status) 1 10 X - Reassessment of Adherence to Treatment Plan 1 5 ASSESSMENTS - Wound and Skin Assessment / Reassessment []  - Simple Wound Assessment / Reassessment - one wound 0 []  - Complex Wound Assessment / Reassessment - multiple wounds 0 []  - Dermatologic / Skin Assessment (not related to wound area) 0 ASSESSMENTS - Focused Assessment X - Circumferential Edema Measurements - multi extremities 2 5 []  - Nutritional Assessment / Counseling / Intervention 0 []  - Lower Extremity Assessment (monofilament, tuning fork, pulses) 0 []  - Peripheral Arterial Disease Assessment (using hand held doppler) 0 ASSESSMENTS - Ostomy and/or Continence Assessment and Care []  - Incontinence Assessment and Management 0 []  - Ostomy Care Assessment and Management (repouching, etc.) 0 PROCESS - Coordination of Care []  - Simple Patient / Family Education for ongoing care 0 X - Complex (extensive) Patient / Family Education for ongoing care 1 20 X - Staff obtains Programmer, systems, Records, Test Results / Process Orders 1 10 X - Staff telephones HHA, Nursing Homes / Clarify orders / etc 1 10 []  - Routine Transfer to another Facility (non-emergent condition) 0 Brooke Hopkins, BRACY. ([id number]) []  - Routine Hospital Admission (non-emergent condition) 0 []  - New Admissions / Biomedical engineer / Ordering NPWT, Apligraf, etc. 0 []  - Emergency Hospital Admission (emergent condition) 0 X - Simple Discharge Coordination 1 10 []  - Complex (extensive) Discharge Coordination 0 PROCESS - Special Needs []  - Pediatric / Minor Patient Management 0 []  - Isolation Patient Management 0 []  - Hearing / Language / Visual special needs 0 []  - Assessment of Community assistance (transportation, D/C planning, etc.) 0 []  - Additional assistance /  Altered mentation 0 []  - Support Surface(s) Assessment (bed, cushion, seat, etc.) 0 INTERVENTIONS - Wound Cleansing / Measurement []  - Simple Wound Cleansing - one wound 0 []  - Complex  Wound Cleansing - multiple wounds 0 X - Wound Imaging (photographs - any number of wounds) 1 5 []  - Wound Tracing (instead of photographs) 0 []  - Simple Wound Measurement - one wound 0 []  - Complex Wound Measurement - multiple wounds 0 INTERVENTIONS - Wound Dressings []  - Small Wound Dressing one or multiple wounds 0 []  - Medium Wound Dressing one or multiple wounds 0 []  - Large Wound Dressing one or multiple wounds 0 []  - Application of Medications - topical 0 []  - Application of Medications - injection 0 INTERVENTIONS - Miscellaneous []  - External ear exam 0 Brooke Hopkins, SEEFELD. ([id number]) []  - Specimen Collection (cultures, biopsies, blood, body fluids, etc.) 0 []  - Specimen(s) / Culture(s) sent or taken to Lab for analysis 0 []  - Patient Transfer (multiple staff / Harrel Lemon Lift / Similar devices) 0 []  - Simple Staple / Suture removal (25 or less) 0 []  - Complex Staple / Suture removal (26 or more) 0 []  - Hypo / Hyperglycemic Management (close monitor of Blood Glucose) 0 []  - Ankle / Brachial Index (ABI) - do not check if billed separately 0 X - Vital Signs 1 5 Has the patient been seen at the hospital within the last three years: Yes Total Score: 85 Level Of Care: New/Established - Level 3 Electronic Signature(s) Signed: 03/31/2017 4:13:19 PM By: Brooke Hopkins Entered By: Brooke Hopkins on 03/28/2017 09:21:44 Brooke Hopkins ([id number]) -------------------------------------------------------------------------------- Encounter Discharge Information Details Patient Name: [name], [name] Date of Service: 03/28/2017 8:45 AM Medical Record Number: [id number] Patient Account Number: [id number] Date of Birth/Sex: 26-Oct-1951 (66 y.o. Female) Treating RN: Brooke Hopkins Primary Care Tamon Parkerson:  Hopkins, DAVID Other Clinician: Referring Mihailo Sage: Hopkins, DAVID Treating Cristyn Crossno/Extender: Brooke Hopkins in Treatment: 25 Encounter Discharge Information Items Discharge Pain Level: 0 Discharge Condition: Stable Ambulatory Status: Wheelchair Discharge Destination: Home Transportation: Private Auto Accompanied By: son Schedule Follow-up Appointment: Yes Medication Reconciliation completed and provided to Patient/Care No Adyson Vanburen: Provided on Clinical Summary of Care: 03/28/2017 Form Type Recipient Paper Patient LB Electronic Signature(s) Signed: 03/28/2017 9:12:40 AM By: Ruthine Dose Entered By: Ruthine Dose on 03/28/2017 09:12:40 Brooke Hopkins ([id number]) -------------------------------------------------------------------------------- Lower Extremity Assessment Details Patient Name: GISELLE, BRUTUS. Date of Service: 03/28/2017 8:45 AM Medical Record Number: [id number] Patient Account Number: [id number] Date of Birth/Sex: 06/11/1951 (66 y.o. Female) Treating RN: Brooke Hopkins Primary Care Bently Wyss: Hopkins, DAVID Other Clinician: Referring Virlan Kempker: Hopkins, DAVID Treating Emnet Monk/Extender: Brooke Hopkins in Treatment: 40 Edema Assessment Assessed: [Left: No] [Right: No] E[Left: dema] [Right: :] Calf Left: Right: Point of Measurement: 32 cm From Medial Instep 47.6 cm 48.5 cm Ankle Left: Right: Point of Measurement: 11 cm From Medial Instep 30 cm 28.2 cm Vascular Assessment Pulses: Dorsalis Pedis Palpable: [Left:Yes] [Right:Yes] Posterior Tibial Extremity colors, hair growth, and conditions: Extremity Color: [Left:Red] [Right:Red] Temperature of Extremity: [Left:Warm] [Right:Warm] Capillary Refill: [Left:< 3 seconds] [Right:< 3 seconds] Electronic Signature(s) Signed: 03/31/2017 4:13:19 PM By: Brooke Hopkins Entered By: Brooke Hopkins on 03/28/2017 08:56:51 Jiles, Maryclare M.  ([id number]) -------------------------------------------------------------------------------- Multi Wound Chart Details Patient Name: MARYUM, BATTERSON. Date of Service: 03/28/2017 8:45 AM Medical Record Number: [id number] Patient Account Number: [id number] Date of Birth/Sex: 01/13/1951 (66 y.o. Female) Treating RN: Brooke Hopkins Primary Care Lynanne Delgreco: Hopkins, DAVID Other Clinician: Referring Kannen Moxey: Hopkins, DAVID Treating Tymel Conely/Extender: Brooke Hopkins in Treatment: 40 Vital Signs Height(in): 67 Pulse(bpm): 77 Weight(lbs): 300 Blood Pressure 171/70 (mmHg): Body Mass Index(BMI): 47 Temperature(F): 97.8 Respiratory Rate 18 (breaths/min): Photos: [1:No Photos] [3:No Photos] [N/A:N/A] Wound Location: [  1:Left Lower Leg - Posterior] [3:Right Calcaneus] [N/A:N/A] Wounding Event: [1:Gradually Appeared] [3:Pressure Injury] [N/A:N/A] Primary Etiology: [1:Lymphedema] [3:Pressure Ulcer] [N/A:N/A] Date Acquired: [1:12/31/2015] [3:01/24/2017] [N/A:N/A] Weeks of Treatment: [1:40] [3:7] [N/A:N/A] Wound Status: [1:Healed - Epithelialized] [3:Healed - Epithelialized] [N/A:N/A] Measurements L x W x D 0x0x0 [3:0x0x0] [N/A:N/A] (cm) Area (cm) : [1:0] [3:0] [N/A:N/A] Volume (cm) : [1:0] [3:0] [N/A:N/A] % Reduction in Area: [1:100.00%] [3:100.00%] [N/A:N/A] % Reduction in Volume: 100.00% [3:100.00%] [N/A:N/A] Classification: [1:Partial Thickness] [3:Category/Stage II] [N/A:N/A] Exudate Amount: [1:None Present] [3:None Present] [N/A:N/A] Granulation Amount: [1:None Present (0%)] [3:None Present (0%)] [N/A:N/A] Necrotic Amount: [1:None Present (0%)] [3:None Present (0%)] [N/A:N/A] Epithelialization: [1:Large (67-100%)] [3:None] [N/A:N/A] Periwound Skin Texture: No Abnormalities Noted [3:No Abnormalities Noted] [N/A:N/A] Periwound Skin [1:No Abnormalities Noted] [3:No Abnormalities Noted] [N/A:N/A] Moisture: Periwound Skin Color: No Abnormalities Noted [3:No Abnormalities  Noted] [N/A:N/A] Tenderness on [1:No] [3:No] [N/A:N/A] Palpation: Wound Preparation: [1:Ulcer Cleansing: Rinsed/Irrigated with Saline] [3:Ulcer Cleansing: Rinsed/Irrigated with Saline] [N/A:N/A] Topical Anesthetic Topical Anesthetic Applied: None Applied: None Brooke Hopkins, Brooke Hopkins ([id number]) Treatment Notes Electronic Signature(s) Signed: 03/28/2017 9:12:22 AM By: Christin Fudge MD, FACS Entered By: Christin Fudge on 03/28/2017 09:12:22 Brooke Hopkins ([id number]) -------------------------------------------------------------------------------- [place] Details Patient Name: Brooke Hopkins, Brooke Hopkins. Date of Service: 03/28/2017 8:45 AM Medical Record Number: [id number] Patient Account Number: [id number] Date of Birth/Sex: March 17, 1951 (66 y.o. Female) Treating RN: Brooke Hopkins Primary Care Bristal Steffy: Hopkins, DAVID Other Clinician: Referring Layanna Charo: Hopkins, DAVID Treating Jaze Rodino/Extender: Brooke Hopkins in Treatment: 40 Active Inactive Electronic Signature(s) Signed: 03/31/2017 4:13:19 PM By: Brooke Hopkins Entered By: Brooke Hopkins on 03/28/2017 08:57:23 Brooke Hopkins ([id number]) -------------------------------------------------------------------------------- Pain Assessment Details Patient Name: Brooke Hopkins, Brooke Hopkins. Date of Service: 03/28/2017 8:45 AM Medical Record Number: [id number] Patient Account Number: [id number] Date of Birth/Sex: [birth year] (66 y.o. Female) Treating RN: Brooke Hopkins Primary Care Gregorey Nabor: Hopkins, DAVID Other Clinician: Referring Delfin Squillace: Hopkins, DAVID Treating Asencion Guisinger/Extender: Brooke Hopkins in Treatment: 40 Active Problems Location of Pain Severity and Description of Pain Patient Has Paino No Site Locations With Dressing Change: No Pain Management and Medication Current Pain Management: Electronic Signature(s) Signed: 03/31/2017 4:13:19 PM By: Brooke Hopkins Entered By: Brooke Hopkins  on 03/28/2017 08:40:13 Brooke Hopkins ([id number]) -------------------------------------------------------------------------------- Patient/Caregiver Education Details Patient Name: Brooke Hopkins, Brooke Hopkins. Date of Service: 03/28/2017 8:45 AM Medical Record Number: [id number] Patient Account Number: [id number] Date of Birth/Gender: November 16, 1951 (66 y.o. Female) Treating RN: Brooke Hopkins Primary Care Physician: Hopkins, DAVID Other Clinician: Referring Physician: FITZGERALD, DAVID Treating Physician/Extender: Brooke Hopkins in Treatment: 56 Education Assessment Education Provided To: Patient Education Topics Provided Wound/Skin Impairment: Handouts: Other: wear your compression wraps Methods: Demonstration, Explain/Verbal Responses: State content correctly Electronic Signature(s) Signed: 03/31/2017 4:13:19 PM By: Brooke Hopkins Entered By: Brooke Hopkins on 03/28/2017 09:02:39 Brooke Hopkins ([id number]) -------------------------------------------------------------------------------- Wound Assessment Details Patient Name: Brooke Hopkins, Brooke Hopkins. Date of Service: 03/28/2017 8:45 AM Medical Record Number: [id number] Patient Account Number: [id number] Date of Birth/Sex: July 07, 1951 (66 y.o. Female) Treating RN: Brooke Hopkins Primary Care Skyley Grandmaison: Hopkins, DAVID Other Clinician: Referring Matthe Sloane: Hopkins, DAVID Treating Ellaina Schuler/Extender: Brooke Hopkins in Treatment: 40 Wound Status Wound Number: 1 Primary Etiology: Lymphedema Wound Location: Left Lower Leg - Posterior Wound Status: Healed - Epithelialized Wounding Event: Gradually Appeared Date Acquired: 12/31/2015 Weeks Of Treatment: 40 Clustered Wound: No Photos Photo Uploaded By: Brooke Hopkins on 03/28/2017 11:12:53 Wound Measurements Length: (cm) 0 % Reduction Width: (cm) 0 % Reduction Depth: (cm) 0 Epithelializ Area: (cm) 0 Tunneling: Volume: (cm) 0 Undermining in Area: 100% in  Volume: 100%  ation: Large (67-100%) No : No Wound Description Classification: Partial Thickness Exudate Amount: None Present Foul Odor After Cleansing: No Slough/Fibrino No Wound Bed Granulation Amount: None Present (0%) Necrotic Amount: None Present (0%) Periwound Skin Texture Texture Color No Abnormalities Noted: No No Abnormalities Noted: No Moisture Brooke Hopkins, RADY. ([id number]) No Abnormalities Noted: No Wound Preparation Ulcer Cleansing: Rinsed/Irrigated with Saline Topical Anesthetic Applied: None Electronic Signature(s) Signed: 03/31/2017 4:13:19 PM By: Brooke Hopkins Entered By: Brooke Hopkins on 03/28/2017 08:55:45 Harbin, Eupha M. ([id number]) -------------------------------------------------------------------------------- Wound Assessment Details Patient Name: Brooke Hopkins, Brooke Hopkins. Date of Service: 03/28/2017 8:45 AM Medical Record Number: [id number] Patient Account Number: [id number] Date of Birth/Sex: Jan 22, 1951 (66 y.o. Female) Treating RN: Brooke Hopkins Primary Care Maame Dack: Hopkins, DAVID Other Clinician: Referring Verbie Babic: Hopkins, DAVID Treating Genesee Nase/Extender: Brooke Hopkins in Treatment: 40 Wound Status Wound Number: 3 Primary Etiology: Pressure Ulcer Wound Location: Right Calcaneus Wound Status: Healed - Epithelialized Wounding Event: Pressure Injury Date Acquired: 01/24/2017 Weeks Of Treatment: 7 Clustered Wound: No Photos Photo Uploaded By: Brooke Hopkins on 03/28/2017 11:12:53 Wound Measurements Length: (cm) 0 % Reduction i Width: (cm) 0 % Reduction i Depth: (cm) 0 Epithelializa Area: (cm) 0 Tunneling: Volume: (cm) 0 Undermining: n Area: 100% n Volume: 100% tion: None No No Wound Description Classification: Category/Stage II Foul Odor Aft Exudate Amount: None Present Slough/Fibrin er Cleansing: No o No Wound Bed Granulation Amount: None Present (0%) Necrotic Amount: None Present (0%) Periwound Skin  Texture Texture Color No Abnormalities Noted: No No Abnormalities Noted: No Moisture Brooke Hopkins, ROWLANDS. ([id number]) No Abnormalities Noted: No Wound Preparation Ulcer Cleansing: Rinsed/Irrigated with Saline Topical Anesthetic Applied: None Electronic Signature(s) Signed: 03/31/2017 4:13:19 PM By: Brooke Hopkins Entered By: Brooke Hopkins on 03/28/2017 08:56:42 Brooke Hopkins ([id number]) -------------------------------------------------------------------------------- Vitals Details Patient Name: SENETRA, DILLIN. Date of Service: 03/28/2017 8:45 AM Medical Record Number: [id number] Patient Account Number: [id number] Date of Birth/Sex: 1951/12/13 (66 y.o. Female) Treating RN: Brooke Hopkins Primary Care Florabel Faulks: Hopkins, DAVID Other Clinician: Referring Simya Tercero: Hopkins, DAVID Treating Sandralee Tarkington/Extender: Brooke Hopkins in Treatment: 40 Vital Signs Time Taken: 08:48 Temperature (F): 97.8 Height (in): 67 Pulse (bpm): 77 Weight (lbs): 300 Respiratory Rate (breaths/min): 18 Body Mass Index (BMI): 47 Blood Pressure (mmHg): 171/70 Reference Range: 80 - 120 mg / dl Electronic Signature(s) Signed: 03/31/2017 4:13:19 PM By: Brooke Hopkins Entered By: Brooke Hopkins on 03/28/2017 08:48:33

## 2017-04-02 DIAGNOSIS — R809 Proteinuria, unspecified: Secondary | ICD-10-CM | POA: Diagnosis not present

## 2017-04-02 DIAGNOSIS — E1129 Type 2 diabetes mellitus with other diabetic kidney complication: Secondary | ICD-10-CM | POA: Diagnosis not present

## 2017-04-02 DIAGNOSIS — E785 Hyperlipidemia, unspecified: Secondary | ICD-10-CM | POA: Diagnosis not present

## 2017-04-02 DIAGNOSIS — I83023 Varicose veins of left lower extremity with ulcer of ankle: Secondary | ICD-10-CM | POA: Diagnosis not present

## 2017-04-02 DIAGNOSIS — R600 Localized edema: Secondary | ICD-10-CM | POA: Diagnosis not present

## 2017-04-02 DIAGNOSIS — Z794 Long term (current) use of insulin: Secondary | ICD-10-CM | POA: Diagnosis not present

## 2017-04-02 DIAGNOSIS — L97329 Non-pressure chronic ulcer of left ankle with unspecified severity: Secondary | ICD-10-CM | POA: Diagnosis not present

## 2017-04-02 DIAGNOSIS — I10 Essential (primary) hypertension: Secondary | ICD-10-CM | POA: Diagnosis not present

## 2017-04-04 ENCOUNTER — Encounter: Payer: 59 | Attending: Surgery | Admitting: Surgery

## 2017-04-04 DIAGNOSIS — L97212 Non-pressure chronic ulcer of right calf with fat layer exposed: Secondary | ICD-10-CM | POA: Insufficient documentation

## 2017-04-04 DIAGNOSIS — E1140 Type 2 diabetes mellitus with diabetic neuropathy, unspecified: Secondary | ICD-10-CM | POA: Insufficient documentation

## 2017-04-04 DIAGNOSIS — E11622 Type 2 diabetes mellitus with other skin ulcer: Secondary | ICD-10-CM | POA: Diagnosis not present

## 2017-04-04 DIAGNOSIS — I10 Essential (primary) hypertension: Secondary | ICD-10-CM | POA: Insufficient documentation

## 2017-04-04 DIAGNOSIS — I890 Lymphedema, not elsewhere classified: Secondary | ICD-10-CM | POA: Diagnosis not present

## 2017-04-04 DIAGNOSIS — L89612 Pressure ulcer of right heel, stage 2: Secondary | ICD-10-CM | POA: Insufficient documentation

## 2017-04-04 DIAGNOSIS — Z6841 Body Mass Index (BMI) 40.0 and over, adult: Secondary | ICD-10-CM | POA: Diagnosis not present

## 2017-04-04 DIAGNOSIS — Z87891 Personal history of nicotine dependence: Secondary | ICD-10-CM | POA: Insufficient documentation

## 2017-04-04 DIAGNOSIS — L97222 Non-pressure chronic ulcer of left calf with fat layer exposed: Secondary | ICD-10-CM | POA: Insufficient documentation

## 2017-04-04 DIAGNOSIS — L97219 Non-pressure chronic ulcer of right calf with unspecified severity: Secondary | ICD-10-CM | POA: Diagnosis not present

## 2017-04-04 DIAGNOSIS — L97229 Non-pressure chronic ulcer of left calf with unspecified severity: Secondary | ICD-10-CM | POA: Diagnosis not present

## 2017-04-05 NOTE — Progress Notes (Signed)
Brooke Hopkins ([id number]) Visit Report for 04/04/2017 Chief Complaint Document Details Patient Name: Brooke Hopkins, Brooke Hopkins. Date of Service: 04/04/2017 8:45 AM Medical Record Number: [id number] Patient Account Number: [id number] Date of Birth/Sex: [birth year] (66 y.o. Female) Treating RN: Brooke Hopkins Primary Care Provider: FITZGERALD, Hopkins Other Clinician: Referring Provider: FITZGERALD, Hopkins Treating Provider/Extender: Brooke Hopkins in Treatment: 90 Information Obtained from: Patient Chief Complaint Brooke Hopkins returns for follow-up to her bilateral lower extremity ulcers Electronic Signature(s) Signed: 04/04/2017 9:08:19 AM By: Brooke Fudge MD, FACS Entered By: Brooke Hopkins on 04/04/2017 09:08:19 Brooke Hopkins ([id number]) -------------------------------------------------------------------------------- HPI Details Patient Name: Brooke Hopkins, Brooke Hopkins. Date of Service: 04/04/2017 8:45 AM Medical Record Number: [id number] Patient Account Number: [id number] Date of Birth/Sex: 04/12/1951 (66 y.o. Female) Treating RN: Brooke Hopkins Primary Care Provider: FITZGERALD, Hopkins Other Clinician: Referring Provider: FITZGERALD, Hopkins Treating Provider/Extender: Brooke Hopkins in Treatment: 21 History of Present Illness Location: massive swelling of both lower extremities and ulceration both lower extremities Quality: Patient reports experiencing a dull pain to affected area(s). Severity: Patient states wound are getting worse. Duration: Patient has had the wound for >2 years prior to seeking treatment at the wound center Timing: Pain in wound is constant (hurts all the time) Context: The wound appeared gradually over time Modifying Factors: Other treatment(s) tried include:she has a lymphedema pump but uses it seldom and she's had several course of antibiotics Associated Signs and Symptoms: Patient reports having difficulty standing for long periods. HPI Description:  66 year old patient seen by Dr. Ola Hopkins of infectious disease who has been following up for left lower extremity cellulitis and ulcer with recurrent bilateral lower extremity problems for several months. Recently she had a large right lower extremity bullae which opened out and has been ulcerated. She has seen the vascular group and has been getting Unna's wraps and has a lymphedema pump used in the past. Her prior cultures were positive for Pseudomonas, Proteus and was treated with amoxicillin. He has also been treated with 2 weeks course of ciprofloxacin and amoxicillin.. Increase of Lasix dose helped with the edema and echo showed no systolic CHF but may be diastolic problems. Past medical history significant for diabetes mellitus type 2, venous stasis ulcer, obesity, diabetic peripheral neuropathy, status post back surgery, cholecystectomy, hysterectomy, arthroscopy of the knee. He is a former smoker and quit smoking in 1984. The patient has seen Dr. Delana Hopkins who did not recommend any arterial or venous duplex studies and has been using Unna's wraps and also recommended a lymphedema pump. she has been very noncompliant with using these. 06/28/2016 -- the patient is still on antibiotics as prescribed by Dr. Ola Hopkins and he is asked her to take it for 3 weeks. The patient also says she has a lot of redness and pain in the folds of her thigh and lower extremity and this is very painful. 07/26/2016 -- the patient is off antibiotics and has been getting dressing changes 3 times a week. 08/09/2016 -- is been on Cipro and is taking potassium supplements along with her Lasix and is going to be seeing Dr. Ola Hopkins for a consult return visit only in November 2017. 08/23/2016 -- she was admitted to the hospital last week and I have reviewed these reports in detail. She was seen by Dr. Ola Hopkins who noted a Pseudomonas infection which was resistant to ciprofloxacin and discharged her on  Ceftazidime 1 g IV every 12 hourly anyone days. The antibiotic was to be stopped on September 11 and he would see her  back in the clinic. 08/30/2016 -- had a communication from Dr. Ola Hopkins that he would extend her antibiotics by a week if she continued to look like cellulitis was persisting. Brooke Hopkins, Brooke Hopkins ([id number]) 09/13/2016 -- the PICC line is out and antibiotics have stopped. 10/04/16: returns today for f/u. reports utilizes compression pump twice daily. she is compliant with her compression therapy. she reports a new blister on the right proximal posterior calf. no systemic s/s of infection. 10/11/16: returns today for f/u. she reports adherence to her compression pumps, but there is no improvement regarding her BLE edema. there is weeping of fluid. denies fever, chills, body aches or malaise. 10/18/2016 -- she has a significant cellulitis of her right lower extremity which was not there last week. I believe at this stage she will need the expertise of Dr. Ola Hopkins to decide on antibiotic coverage. 10/25/2016 -- the patient has had cipro and ampicillin been started by Dr. Ola Hopkins and he is awaiting further cultures. Overall she's been feeling better. 11/08/2016 -- Dr. Ola Hopkins this week who has continued with ciprofloxacin and amoxicillin and is awaiting further cultures before deciding to place her on a PICC line. 11/15/2016 -- Brooke Hopkins note was reviewed and her cultures growing Pseudomonas in both legs which is sensitive to Cipro and the other is resistant to Cipro and she would get a PICC line and start IV antibiotics -- Ceftazidime 1 g q 12 for 3 weeks. 11-29-16 Brooke Hopkins presents today in follow-up of her bilateral lower exterminators ulcers. She remains on IV antibiotic therapy via a PICC line per Dr. Ola Hopkins; she has a follow-up appointment with him on Monday, 12/02/2016. She anticipates that the about therapy will be discontinued at that appointment.  She denies any complications of nausea vomiting and/or diarrhea accompanied by this antibiotic therapy. She admits to using her lymphedema pumps twice daily with the exception of yesterday. She denies any concerns or complications with the current treatment plan. 12/06/2016 -- Dr. Ola Hopkins saw her recently on 12/02/2016 and due to the fact she has had significant problems with recurrent ulceration and Pseudomonas infection he recommended 3 more weeks of antibiotics and extended it until December 25. The PICC line will be in place. Last hemoglobin A1c on November 30 was 9% 12/27/2016 -- because of the holidays the patient's diet has been higher in salt, her dressings have not been done as required and she is not used to lymphedema pumps appropriately. This has led to her lymphedema increasing markedly. 01/24/2017 -- the patient's hospital bed is malfunctioning and hence her legs have been flat in bed and her lymphedema is increased markedly. During my examination I also noted a unstageable pressure injury to her right heel 02/07/2017 -- she has returned after 2 weeks as she was done with influenza and has been in bed a lot, with a new bed which has helped her reduce the edema due to proper elevation. Will she has a full-fledged large pressure injury to the right heel. 02/28/2017 -- her dressing was being taken down with scissors she had a sharp injury to the left medial calf area which is fairly superficial. Electronic Signature(s) Signed: 04/04/2017 9:08:38 AM By: Brooke Fudge MD, FACS Entered By: Brooke Hopkins on 04/04/2017 09:08:38 Brooke Hopkins ([id number]) -------------------------------------------------------------------------------- Physical Exam Details Patient Name: Brooke Hopkins, Brooke Hopkins. Date of Service: 04/04/2017 8:45 AM Medical Record Number: [id number] Patient Account Number: [id number] Date of Birth/Sex: [birth year] (66 y.o. Female) Treating RN: Brooke Hopkins Primary Care  Provider: FITZGERALD, Hopkins Other  Clinician: Referring Provider: FITZGERALD, Hopkins Treating Provider/Extender: Brooke Hopkins in Treatment: 41 Constitutional . Pulse regular. Respirations normal and unlabored. Afebrile. . Eyes Nonicteric. Reactive to light. Ears, Nose, Mouth, and Throat Lips, teeth, and gums WNL.Marland Kitchen Moist mucosa without lesions. Neck supple and nontender. No palpable supraclavicular or cervical adenopathy. Normal sized without goiter. Respiratory WNL. No retractions.. Breath sounds WNL, No rubs, rales, rhonchi, or wheeze.. Cardiovascular Heart rhythm and rate regular, no murmur or gallop.. Pedal Pulses WNL. No clubbing, cyanosis or edema. Chest Breasts symmetical and no nipple discharge.. Breast tissue WNL, no masses, lumps, or tenderness.. Lymphatic No adneopathy. No adenopathy. No adenopathy. Musculoskeletal Adexa without tenderness or enlargement.. Digits and nails w/o clubbing, cyanosis, infection, petechiae, ischemia, or inflammatory conditions.. Integumentary (Hair, Skin) No suspicious lesions. No crepitus or fluctuance. No peri-wound warmth or erythema. No masses.Marland Kitchen Psychiatric Judgement and insight Intact.. No evidence of depression, anxiety, or agitation.. Notes the wounds have all healed her lymphedema is down completely and there is no weeping. Electronic Signature(s) Signed: 04/04/2017 9:09:00 AM By: Brooke Fudge MD, FACS Entered By: Brooke Hopkins on 04/04/2017 09:09:00 Brooke Hopkins ([id number]) -------------------------------------------------------------------------------- Physician Orders Details Patient Name: Brooke Hopkins, Brooke Hopkins. Date of Service: 04/04/2017 8:45 AM Medical Record Number: [id number] Patient Account Number: [id number] Date of Birth/Sex: [birth year] (66 y.o. Female) Treating RN: Brooke Hopkins Primary Care Provider: FITZGERALD, Hopkins Other Clinician: Referring Provider: FITZGERALD, Hopkins Treating Provider/Extender: Brooke Hopkins in Treatment: 83 Verbal / Phone Orders: Yes Clinician: Carolyne Fiscal, Debi Read Back and Verified: Yes Diagnosis Coding Discharge From [facility] Services o Discharge from [facility] your compression wraps everyday, and wear your lymp pumps as ordered. Please call our office if you have any questions or concerns. Electronic Signature(s) Signed: 04/04/2017 4:19:22 PM By: Brooke Fudge MD, FACS Signed: 04/04/2017 4:32:27 PM By: Alric Quan Entered By: Alric Quan on 04/04/2017 09:05:42 Brooke Hopkins ([id number]) -------------------------------------------------------------------------------- Problem List Details Patient Name: Brooke Hopkins, Brooke Hopkins. Date of Service: 04/04/2017 8:45 AM Medical Record Number: [id number] Patient Account Number: [id number] Date of Birth/Sex: 10-24-1951 (66 y.o. 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 Date of Service: 04/04/2017 8:45 AM Medical Record  Number: [id number] Patient Account Number: [id number] Date of Birth/Sex: 1951-04-02 (66 y.o. Female) Treating RN: Brooke Hopkins Primary Care Provider: FITZGERALD, Hopkins Other Clinician: Referring Provider: FITZGERALD, Hopkins Treating Provider/Extender: Brooke Hopkins in Treatment: 64 Subjective Chief Complaint Information obtained from Patient Mrs. Donlon returns for follow-up to her bilateral lower extremity ulcers History of Present Illness (HPI) The following HPI elements were documented for the patient's wound: Location: massive swelling of both lower extremities and ulceration both lower extremities Quality: Patient reports experiencing a dull pain to affected area(s). Severity: Patient states wound are getting worse. Duration: Patient has had the wound  for >2 years prior to seeking treatment at the wound center Timing: Pain in wound is constant (hurts all the time) Context: The wound appeared gradually over time Modifying Factors: Other treatment(s) tried include:she has a lymphedema pump but uses it seldom and she's had several course of antibiotics Associated Signs and Symptoms: Patient reports having difficulty standing for long periods. 66 year old patient seen by Dr. Ola Hopkins of infectious disease who has been following up for left lower extremity cellulitis and ulcer with recurrent bilateral lower extremity problems for several months. Recently she had a large right lower extremity bullae which opened out and has been ulcerated. She has seen the vascular group and has been getting Unna's wraps and has a lymphedema pump used in the past. Her prior cultures were positive for Pseudomonas, Proteus and was treated with amoxicillin. He has also been treated with 2 weeks course of ciprofloxacin and amoxicillin.. Increase of Lasix dose helped with the edema and echo showed no systolic CHF but may be diastolic problems. Past medical history significant for diabetes mellitus  type 2, venous stasis ulcer, obesity, diabetic peripheral neuropathy, status post back surgery, cholecystectomy, hysterectomy, arthroscopy of the knee. He is a former smoker and quit smoking in 1984. The patient has seen Dr. Delana Hopkins who did not recommend any arterial or venous duplex studies and has been using Unna's wraps and also recommended a lymphedema pump. she has been very noncompliant with using these. 06/28/2016 -- the patient is still on antibiotics as prescribed by Dr. Ola Hopkins and he is asked her to take it for 3 weeks. The patient also says she has a lot of redness and pain in the folds of her thigh and lower extremity and this is very painful. 07/26/2016 -- the patient is off antibiotics and has been getting dressing changes 3 times a week. 08/09/2016 -- is been on Cipro and is taking potassium supplements along with her Lasix and is going to be seeing Dr. Ola Hopkins for a consult return visit only in November 2017. Brooke Hopkins, Brooke Hopkins ([id number]) 08/23/2016 -- she was admitted to the hospital last week and I have reviewed these reports in detail. She was seen by Dr. Ola Hopkins who noted a Pseudomonas infection which was resistant to ciprofloxacin and discharged her on Ceftazidime 1 g IV every 12 hourly anyone days. The antibiotic was to be stopped on September 11 and he would see her back in the clinic. 08/30/2016 -- had a communication from Dr. Ola Hopkins that he would extend her antibiotics by a week if she continued to look like cellulitis was persisting. 09/13/2016 -- the PICC line is out and antibiotics have stopped. 10/04/16: returns today for f/u. reports utilizes compression pump twice daily. she is compliant with her compression therapy. she reports a new blister on the right proximal posterior calf. no systemic s/s of infection. 10/11/16: returns today for f/u. she reports adherence to her compression pumps, but there is no improvement regarding her BLE edema. there  is weeping of fluid. denies fever, chills, body aches or malaise. 10/18/2016 -- she has a significant cellulitis of her right lower extremity which was not there last week. I believe at this stage she will need the expertise of Dr. Ola Hopkins to decide on antibiotic coverage. 10/25/2016 -- the patient has had cipro and ampicillin been started by Dr. Ola Hopkins and he is awaiting further cultures. Overall she's been feeling better. 11/08/2016 -- Dr. Ola Hopkins this week who has continued with ciprofloxacin and amoxicillin and is awaiting further cultures before deciding  to place her on a PICC line. 11/15/2016 -- Brooke Hopkins note was reviewed and her cultures growing Pseudomonas in both legs which is sensitive to Cipro and the other is resistant to Cipro and she would get a PICC line and start IV antibiotics -- Ceftazidime 1 g q 12 for 3 weeks. 11-29-16 Brooke Hopkins presents today in follow-up of her bilateral lower exterminators ulcers. She remains on IV antibiotic therapy via a PICC line per Dr. Ola Hopkins; she has a follow-up appointment with him on Monday, 12/02/2016. She anticipates that the about therapy will be discontinued at that appointment. She denies any complications of nausea vomiting and/or diarrhea accompanied by this antibiotic therapy. She admits to using her lymphedema pumps twice daily with the exception of yesterday. She denies any concerns or complications with the current treatment plan. 12/06/2016 -- Dr. Ola Hopkins saw her recently on 12/02/2016 and due to the fact she has had significant problems with recurrent ulceration and Pseudomonas infection he recommended 3 more weeks of antibiotics and extended it until December 25. The PICC line will be in place. Last hemoglobin A1c on November 30 was 9% 12/27/2016 -- because of the holidays the patient's diet has been higher in salt, her dressings have not been done as required and she is not used to lymphedema pumps  appropriately. This has led to her lymphedema increasing markedly. 01/24/2017 -- the patient's hospital bed is malfunctioning and hence her legs have been flat in bed and her lymphedema is increased markedly. During my examination I also noted a unstageable pressure injury to her right heel 02/07/2017 -- she has returned after 2 weeks as she was done with influenza and has been in bed a lot, with a new bed which has helped her reduce the edema due to proper elevation. Will she has a full-fledged large pressure injury to the right heel. 02/28/2017 -- her dressing was being taken down with scissors she had a sharp injury to the left medial calf area which is fairly superficial. Brooke Hopkins, Brooke Hopkins. ([id number]) Objective Constitutional Pulse regular. Respirations normal and unlabored. Afebrile. Vitals Time Taken: 8:49 AM, Height: 67 in, Weight: 300 lbs, BMI: 47, Temperature: 98.1 F, Pulse: 83 bpm, Respiratory Rate: 18 breaths/min, Blood Pressure: 149/70 mmHg. Eyes Nonicteric. Reactive to light. Ears, Nose, Mouth, and Throat Lips, teeth, and gums WNL.Marland Kitchen Moist mucosa without lesions. Neck supple and nontender. No palpable supraclavicular or cervical adenopathy. Normal sized without goiter. Respiratory WNL. No retractions.. Breath sounds WNL, No rubs, rales, rhonchi, or wheeze.. Cardiovascular Heart rhythm and rate regular, no murmur or gallop.. Pedal Pulses WNL. No clubbing, cyanosis or edema. Chest Breasts symmetical and no nipple discharge.. Breast tissue WNL, no masses, lumps, or tenderness.. Lymphatic No adneopathy. No adenopathy. No adenopathy. Musculoskeletal Adexa without tenderness or enlargement.. Digits and nails w/o clubbing, cyanosis, infection, petechiae, ischemia, or inflammatory conditions.Marland Kitchen Psychiatric Judgement and insight Intact.. No evidence of depression, anxiety, or agitation.. General Notes: the wounds have all healed her lymphedema is down completely and there is  no weeping. Integumentary (Hair, Skin) No suspicious lesions. No crepitus or fluctuance. No peri-wound warmth or erythema. 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 Please call our office if you have any questions or concerns. the patient is discharged from the wound care services today with an excellent result and we have had a long discussion regarding maintaining this progress and good control of her lymphedema. 1. I would like to transition her to juxta lites and have discussed at great lengths that she should be wearing this the entire day, starting first thing in the morning, up until the time she is in bed. 2. Elevation in the bed has also been discussed and elevation and exercise in general has been discussed. 3. She will also continue using her lymphedema pumps. 4. I have asked her to monitor the situation closely including watching her diet, salt intake, trying to lose weight and appropriate use of her lymphedema pumps. If during this coming week she begins excessive weeping, her extremities ulcerate or increase in her lymphedema she should immediately call us or her home health nurse to reapply a compression wrap. She and her husband both understand the treatment plan Electronic Signature(s) Signed: 04/04/2017 9:13:12 AM By: Brooke Fudge MD, FACS Entered By: Brooke Hopkins on 04/04/2017 09:13:12 ARRIE, BORRELLI ([id number], PRYCE  ([id number]) -------------------------------------------------------------------------------- SuperBill Details Patient Name: TAZIAH, DIFATTA. Date of Service: 04/04/2017 Medical Record Number: [id number] Patient Account Number: [id number] Date of Birth/Sex: Aug 24, 1951 (66 y.o. 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 E66.01 Morbid (severe) obesity due to excess calories L89.612 Pressure ulcer of right heel, stage 2 Physician Procedures CPT4 Code: [id number] Description: 97948 - WC PHYS LEVEL 3 - EST PT ICD-10 Description Diagnosis E11.622 Type 2 diabetes mellitus with other skin ulcer I89.0 Lymphedema, not elsewhere classified L97.222 Non-pressure chronic ulcer of left calf with fat 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 Modifier: layer exposed layer exposed Quantity: 1 Electronic Signature(s) Signed: 04/04/2017 9:13:33 AM By: Brooke Fudge MD, FACS Entered By: Brooke Hopkins on 04/04/2017 09:13:33

## 2017-04-05 NOTE — Progress Notes (Signed)
ETHELL, BLATCHFORD ([id number]) Visit Report for 04/04/2017 Arrival Information Details Patient Name: Brooke Hopkins, Brooke Hopkins. Date of Service: 04/04/2017 8:45 AM Medical Record Number: [id number] Patient Account Number: [id number] Date of Birth/Sex: 11-Mar-1951 (66 y.o. Female) Treating RN: Ahmed Prima Primary Care Laqueena Hinchey: FITZGERALD, DAVID Other Clinician: Referring Renzo Vincelette: FITZGERALD, DAVID Treating Makynleigh Breslin/Extender: Frann Rider in Treatment: 61 Visit Information History Since Last Visit All ordered tests and consults were completed: No Patient Arrived: Wheel Chair Added or deleted any medications: No Arrival Time: 08:44 Any new allergies or adverse reactions: No Accompanied By: husband Had a fall or experienced change in No Transfer Assistance: EasyPivot activities of daily living that may affect Patient Lift risk of falls: Patient Identification Verified: Yes Signs or symptoms of abuse/neglect since last No Secondary Verification Process Yes visito Completed: Hospitalized since last visit: No Patient Requires Transmission- No Has Compression in Place as Prescribed: Yes Based Precautions: Pain Present Now: No Patient Has Alerts: No Electronic Signature(s) Signed: 04/04/2017 4:32:27 PM By: Alric Quan Entered By: Alric Quan on 04/04/2017 08:44:30 Brooke Hopkins ([id number]) -------------------------------------------------------------------------------- Encounter Discharge Information Details Patient Name: Brooke Hopkins, Brooke Hopkins. Date of Service: 04/04/2017 8:45 AM Medical Record Number: [id number] Patient Account Number: [id number] Date of Birth/Sex: [birth year] (66 y.o. Female) Treating RN: Ahmed Prima Primary Care Jinelle Butchko: FITZGERALD, DAVID Other Clinician: Referring Amauri Medellin: FITZGERALD, DAVID Treating Maddi Collar/Extender: Frann Rider in Treatment: 64 Encounter Discharge Information Items Discharge Pain Level: 0 Discharge Condition:  Stable Ambulatory Status: Wheelchair Discharge Destination: Home Transportation: Private Auto Accompanied By: husband Schedule Follow-up Appointment: No Medication Reconciliation completed No and provided to Patient/Care Atiba Kimberlin: Provided on Clinical Summary of Care: 04/04/2017 Form Type Recipient Paper Patient LB Electronic Signature(s) Signed: 04/04/2017 9:15:40 AM By: Ruthine Dose Entered By: Ruthine Dose on 04/04/2017 09:15:40 Brooke Hopkins ([id number]) -------------------------------------------------------------------------------- Lower Extremity Assessment Details Patient Name: Brooke Hopkins, Brooke Hopkins. Date of Service: 04/04/2017 8:45 AM Medical Record Number: [id number] Patient Account Number: [id number] Date of Birth/Sex: [birth year] (66 y.o. Female) Treating RN: Ahmed Prima Primary Care Loel Betancur: FITZGERALD, DAVID Other Clinician: Referring Damaris Geers: FITZGERALD, DAVID Treating Welford Christmas/Extender: Frann Rider in Treatment: 41 Edema Assessment Assessed: [Left: No] [Right: No] E[Left: dema] [Right: :] Calf Left: Right: Point of Measurement: 32 cm From Medial Instep 43.2 cm 44 cm Ankle Left: Right: Point of Measurement: 11 cm From Medial Instep 25.1 cm 25.5 cm Vascular Assessment Pulses: Dorsalis Pedis Palpable: [Left:Yes] [Right:Yes] Posterior Tibial Extremity colors, hair growth, and conditions: Extremity Color: [Left:Normal] [Right:Normal] Temperature of Extremity: [Left:Warm] [Right:Warm] Capillary Refill: [Left:< 3 seconds] [Right:< 3 seconds] Electronic Signature(s) Signed: 04/04/2017 4:32:27 PM By: Alric Quan Entered By: Alric Quan on 04/04/2017 08:52:16 Brooke Hopkins ([id number]) -------------------------------------------------------------------------------- Multi Wound Chart Details Patient Name: Brooke Hopkins. Date of Service: 04/04/2017 8:45 AM Medical Record Number: [id number] Patient Account Number: [id number] Date of  Birth/Sex: 1951-02-10 (66 y.o. Female) Treating RN: Ahmed Prima Primary Care Adaleen Hulgan: FITZGERALD, DAVID Other Clinician: Referring Alvar Malinoski: FITZGERALD, DAVID Treating Dewayne Jurek/Extender: Frann Rider in Treatment: 41 Vital Signs Height(in): 67 Pulse(bpm): 83 Weight(lbs): 300 Blood Pressure 149/70 (mmHg): Body Mass Index(BMI): 47 Temperature(F): 98.1 Respiratory Rate 18 (breaths/min): Wound Assessments Treatment Notes Electronic Signature(s) Signed: 04/04/2017 9:08:11 AM By: Christin Fudge MD, FACS Entered By: Christin Fudge on 04/04/2017 09:08:11 Brooke Hopkins ([id number]) -------------------------------------------------------------------------------- [place] Details Patient Name: Brooke Hopkins, Brooke Hopkins. Date of Service: 04/04/2017 8:45 AM Medical Record Number: [id number] Patient Account Number: [id number] Date of Birth/Sex: 10-21-1951 (66 y.o. Female) Treating RN: Ahmed Prima Primary Care Fonda Rochon: FITZGERALD, DAVID Other  Clinician: Referring Laasia Arcos: FITZGERALD, DAVID Treating Gaetana Kawahara/Extender: Frann Rider in Treatment: 6 Active Inactive Electronic Signature(s) Signed: 04/04/2017 4:32:27 PM By: Alric Quan Entered By: Alric Quan on 04/04/2017 09:04:12 Brooke Hopkins ([id number]) -------------------------------------------------------------------------------- Pain Assessment Details Patient Name: Brooke Hopkins, Brooke Hopkins. Date of Service: 04/04/2017 8:45 AM Medical Record Number: [id number] Patient Account Number: [id number] Date of Birth/Sex: 06-13-1951 (66 y.o. Female) Treating RN: Ahmed Prima Primary Care Jaimya Feliciano: FITZGERALD, DAVID Other Clinician: Referring Falon Huesca: FITZGERALD, DAVID Treating Kaliana Albino/Extender: Frann Rider in Treatment: 65 Active Problems Location of Pain Severity and Description of Pain Patient Has Paino No Site Locations With Dressing Change: No Pain Management and  Medication Current Pain Management: Electronic Signature(s) Signed: 04/04/2017 4:32:27 PM By: Alric Quan Entered By: Alric Quan on 04/04/2017 08:44:54 Brooke Hopkins ([id number]) -------------------------------------------------------------------------------- Patient/Caregiver Education Details Patient Name: Brooke Hopkins, Brooke Hopkins. Date of Service: 04/04/2017 8:45 AM Medical Record Number: [id number] Patient Account Number: [id number] Date of Birth/Gender: 06/25/1951 (66 y.o. Female) Treating RN: Ahmed Prima Primary Care Physician: FITZGERALD, DAVID Other Clinician: Referring Physician: FITZGERALD, DAVID Treating Physician/Extender: Frann Rider in Treatment: 72 Education Assessment Education Provided To: Patient Education Topics Provided Wound/Skin Impairment: Handouts: Other: Wear your compression wraps everyday, and wear your lymp pumps as ordered. Methods: Demonstration, Explain/Verbal Responses: State content correctly Electronic Signature(s) Signed: 04/04/2017 4:32:27 PM By: Alric Quan Entered By: Alric Quan on 04/04/2017 09:06:46 Brooke Hopkins ([id number]) -------------------------------------------------------------------------------- [place] Details Patient Name: Brooke Hopkins, Brooke Hopkins. Date of Service: 04/04/2017 8:45 AM Medical Record Number: [id number] Patient Account Number: [id number] Date of Birth/Sex: 04/30/1951 (66 y.o. Female) Treating RN: Ahmed Prima Primary Care Seairra Otani: FITZGERALD, DAVID Other Clinician: Referring Bostyn Kunkler: FITZGERALD, DAVID Treating Indy Kuck/Extender: Frann Rider in Treatment: 55 Vital Signs Time Taken: 08:49 Temperature (F): 98.1 Height (in): 67 Pulse (bpm): 83 Weight (lbs): 300 Respiratory Rate (breaths/min): 18 Body Mass Index (BMI): 47 Blood Pressure (mmHg): 149/70 Reference Range: 80 - 120 mg / dl Electronic Signature(s) Signed: 04/04/2017 4:32:27 PM By: Alric Quan Entered By:  Alric Quan on 04/04/2017 08:49:50

## 2017-04-30 ENCOUNTER — Emergency Department
Admission: EM | Admit: 2017-04-30 | Discharge: 2017-04-30 | Disposition: A | Discharge status: Home / Self Care | Payer: 59 | Attending: Emergency Medicine | Admitting: Emergency Medicine

## 2017-04-30 ENCOUNTER — Emergency Department: Payer: 59

## 2017-04-30 DIAGNOSIS — I129 Hypertensive chronic kidney disease with stage 1 through stage 4 chronic kidney disease, or unspecified chronic kidney disease: Secondary | ICD-10-CM | POA: Diagnosis not present

## 2017-04-30 DIAGNOSIS — Y999 Unspecified external cause status: Secondary | ICD-10-CM | POA: Diagnosis not present

## 2017-04-30 DIAGNOSIS — Y92002 Bathroom of unspecified non-institutional (private) residence single-family (private) house as the place of occurrence of the external cause: Secondary | ICD-10-CM | POA: Diagnosis not present

## 2017-04-30 DIAGNOSIS — N189 Chronic kidney disease, unspecified: Secondary | ICD-10-CM | POA: Insufficient documentation

## 2017-04-30 DIAGNOSIS — E1122 Type 2 diabetes mellitus with diabetic chronic kidney disease: Secondary | ICD-10-CM | POA: Diagnosis not present

## 2017-04-30 DIAGNOSIS — Z7982 Long term (current) use of aspirin: Secondary | ICD-10-CM | POA: Insufficient documentation

## 2017-04-30 DIAGNOSIS — N3000 Acute cystitis without hematuria: Secondary | ICD-10-CM

## 2017-04-30 DIAGNOSIS — W19XXXA Unspecified fall, initial encounter: Secondary | ICD-10-CM

## 2017-04-30 DIAGNOSIS — W1839XA Other fall on same level, initial encounter: Secondary | ICD-10-CM | POA: Diagnosis not present

## 2017-04-30 DIAGNOSIS — M25561 Pain in right knee: Secondary | ICD-10-CM | POA: Insufficient documentation

## 2017-04-30 DIAGNOSIS — Y9389 Activity, other specified: Secondary | ICD-10-CM | POA: Diagnosis not present

## 2017-04-30 DIAGNOSIS — Z794 Long term (current) use of insulin: Secondary | ICD-10-CM | POA: Diagnosis not present

## 2017-04-30 DIAGNOSIS — S8991XA Unspecified injury of right lower leg, initial encounter: Secondary | ICD-10-CM | POA: Diagnosis present

## 2017-04-30 LAB — CBC
HCT: 35.9 % (ref 35.0–47.0)
Hemoglobin: 12 g/dL (ref 12.0–16.0)
MCH: 26 pg (ref 26.0–34.0)
MCHC: 33.3 g/dL (ref 32.0–36.0)
MCV: 78.2 fL — ABNORMAL LOW (ref 80.0–100.0)
Platelets: 321 10*3/uL (ref 150–440)
RBC: 4.59 MIL/uL (ref 3.80–5.20)
RDW: 16.3 % — ABNORMAL HIGH (ref 11.5–14.5)
WBC: 9.2 10*3/uL (ref 3.6–11.0)

## 2017-04-30 LAB — URINALYSIS, COMPLETE (UACMP) WITH MICROSCOPIC
Bilirubin Urine: NEGATIVE
Glucose, UA: >=500 mg/dL — AB
Hgb urine dipstick: NEGATIVE
Ketones, ur: NEGATIVE mg/dL
Leukocytes, UA: NEGATIVE
Nitrite: NEGATIVE
Protein, ur: 100 mg/dL — AB
Specific Gravity, Urine: 1.022 (ref 1.005–1.030)
pH: 6 (ref 5.0–8.0)

## 2017-04-30 LAB — BASIC METABOLIC PANEL
Anion gap: 9 (ref 5–15)
BUN: 20 mg/dL (ref 6–20)
CO2: 27 mmol/L (ref 22–32)
Calcium: 8.7 mg/dL — ABNORMAL LOW (ref 8.9–10.3)
Chloride: 98 mmol/L — ABNORMAL LOW (ref 101–111)
Creatinine, Ser: 1.3 mg/dL — ABNORMAL HIGH (ref 0.44–1.00)
GFR calc Af Amer: 48 mL/min — ABNORMAL LOW (ref >60)
GFR calc non Af Amer: 41 mL/min — ABNORMAL LOW (ref >60)
Glucose, Bld: 396 mg/dL — ABNORMAL HIGH (ref 65–99)
Potassium: 4.3 mmol/L (ref 3.5–5.1)
Sodium: 134 mmol/L — ABNORMAL LOW (ref 135–145)

## 2017-04-30 LAB — GLUCOSE, CAPILLARY: Glucose-Capillary: 204 mg/dL — ABNORMAL HIGH (ref 65–99)

## 2017-04-30 MED ORDER — ACETAMINOPHEN 500 MG PO TABS: 1000.0000 mg | ORAL_TABLET | Freq: Three times a day (TID) | ORAL | Status: DC

## 2017-04-30 MED ORDER — INSULIN ASPART 100 UNIT/ML ~~LOC~~ SOLN
0.0000 [IU] | Freq: Three times a day (TID) | SUBCUTANEOUS | Status: DC
  Administered 2017-04-30: 3 [IU] via SUBCUTANEOUS
  Filled 2017-04-30: qty 3

## 2017-04-30 MED ORDER — CEPHALEXIN 500 MG PO CAPS
500.0000 mg | ORAL_CAPSULE | Freq: Once | ORAL | Status: AC
  Administered 2017-04-30: 500 mg via ORAL
  Filled 2017-04-30: qty 1

## 2017-04-30 MED ORDER — OXYCODONE HCL 5 MG PO TABS
5.0000 mg | ORAL_TABLET | Freq: Once | ORAL | Status: AC
  Administered 2017-04-30: 5 mg via ORAL
  Filled 2017-04-30: qty 1

## 2017-04-30 MED ORDER — LISINOPRIL 10 MG PO TABS
20.0000 mg | ORAL_TABLET | Freq: Every day | ORAL | Status: DC
  Administered 2017-04-30: 20 mg via ORAL
  Filled 2017-04-30: qty 2

## 2017-04-30 MED ORDER — ACETAMINOPHEN 500 MG PO TABS
1000.0000 mg | ORAL_TABLET | Freq: Once | ORAL | Status: AC
  Administered 2017-04-30: 1000 mg via ORAL
  Filled 2017-04-30: qty 2

## 2017-04-30 MED ORDER — SENNA 8.6 MG PO TABS: 1.0000 | ORAL_TABLET | Freq: Every day | ORAL | Status: DC

## 2017-04-30 MED ORDER — CEPHALEXIN 500 MG PO CAPS
500.0000 mg | ORAL_CAPSULE | Freq: Three times a day (TID) | ORAL | Status: DC
  Administered 2017-04-30: 500 mg via ORAL

## 2017-04-30 MED ORDER — DOCUSATE SODIUM 100 MG PO CAPS
100.0000 mg | ORAL_CAPSULE | Freq: Every day | ORAL | Status: DC
  Administered 2017-04-30: 100 mg via ORAL
  Filled 2017-04-30: qty 1

## 2017-04-30 MED ORDER — OXYCODONE HCL 5 MG PO TABS: 5.0000 mg | ORAL_TABLET | Freq: Four times a day (QID) | ORAL | Status: DC | PRN

## 2017-04-30 MED ORDER — FENTANYL CITRATE (PF) 100 MCG/2ML IJ SOLN
50.0000 ug | Freq: Once | INTRAMUSCULAR | Status: AC
  Administered 2017-04-30: 50 ug via INTRAVENOUS
  Filled 2017-04-30: qty 2

## 2017-04-30 MED ORDER — OXYCODONE HCL 5 MG PO TABS: 5.0000 mg | ORAL_TABLET | Freq: Three times a day (TID) | ORAL | 0 refills | Status: DC | PRN

## 2017-04-30 MED ORDER — INSULIN ASPART 100 UNIT/ML ~~LOC~~ SOLN: 0.0000 [IU] | Freq: Every day | SUBCUTANEOUS | Status: DC

## 2017-04-30 MED ORDER — SODIUM CHLORIDE 0.9 % IV BOLUS (SEPSIS)
1000.0000 mL | Freq: Once | INTRAVENOUS | Status: AC
  Administered 2017-04-30: 1000 mL via INTRAVENOUS

## 2017-04-30 NOTE — ED Provider Notes (Signed)
[facility] Emergency Department Provider Note  ____________________________________________  Time seen: Approximately 12:22 PM  I have reviewed the triage vital signs and the nursing notes.   HISTORY  Chief Complaint Fall   HPI Brooke Hopkins is a 66 y.o. female with a history of chronic kidney disease, diabetes, hypertension, and stroke who presents for evaluation of right knee pain. Patient reports that she had a fall 3 days ago. She was in her bathroom with a walker and got stuck. She was trying to back out of the bathroom when her walker got caught and she fell onto her right knee. She reports severe pain that is worse with ambulation or minimal movement of the right knee that has been constant since the fall, located in the inner aspect of her knee and anterior aspect and nonradiating. Since that fall patient has had 2 other falls because that knee keeps giving out every time she tries to ambulate. Every single fall she fell onto the R knee. She has been in bed for the last 24 hours unable to get up. Her husband goes to work during the day and she stays home by herself. She has not taken her medications, she has not been able to go to the bathroom. When EMS arrived today patient will soiled on her bed. Patient denies head trauma or LOC, she is not on blood thinners, she denies hip pain, neck pain, back pain, chest pain, abdominal pain, any other extremity pain.  Past Medical History:  Diagnosis Date  . Chronic kidney disease   . Diabetes mellitus   . Hypertension   . Stroke [facility])     Patient Active Problem List   Diagnosis Date Noted  . Pure hypercholesterolemia 08/17/2016  . Lymphedema 08/17/2016  . Wound infection 08/16/2016  . 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 Pressure ulcer 10/17/2015  . Cellulitis 10/13/2015    Past Surgical History:  Procedure Laterality Date  . ABDOMINAL HYSTERECTOMY    .  CHOLECYSTECTOMY    . HAMMER TOE SURGERY    . KNEE ARTHROSCOPY    . LUMBAR DISC SURGERY  11/07/11  . LUMBAR WOUND DEBRIDEMENT  11/27/2011   Procedure: LUMBAR WOUND DEBRIDEMENT;  Surgeon: Eustace Moore;  Location: [place] NEURO ORS;  Service: Neurosurgery;  Laterality: N/A;  . TONSILLECTOMY      Prior to Admission medications   Medication Sig Start Date End Date Taking? Authorizing Provider  insulin NPH-regular Human (NOVOLIN 70/30) (70-30) 100 UNIT/ML injection Inject 35 Units into the skin 2 (two) times daily. Patient taking differently: Inject 40 Units into the skin 2 (two) times daily.  08/19/16  Yes Fritzi Mandes, MD  aspirin EC 81 MG tablet Take 81 mg by mouth daily.    Historical Provider, MD  atorvastatin (LIPITOR) 80 MG tablet Take 1 tablet (80 mg total) by mouth daily at 6 PM. Patient not taking: Reported on 04/30/2017 10/17/15   Nicholes Mango, MD  cefTAZidime 1 g in dextrose 5 % 50 mL Inject 1 g into the vein every 8 (eight) hours. Patient not taking: Reported on 04/30/2017 08/19/16   Fritzi Mandes, MD  cloNIDine (CATAPRES - DOSED IN MG/24 HR) 0.2 mg/24hr patch Place 1 patch (0.2 mg total) onto the skin once a week. Patient not taking: Reported on 04/30/2017 10/17/15   Nicholes Mango, MD  clopidogrel (PLAVIX) 75 MG tablet Take 75 mg by mouth daily.    Historical Provider, MD  collagenase Annitta Needs)  ointment Apply topically daily. Patient not taking: Reported on 04/30/2017 10/17/15   Nicholes Mango, MD  furosemide (LASIX) 40 MG tablet Take 40 mg by mouth daily.     Historical Provider, MD  hydrALAZINE (APRESOLINE) 25 MG tablet Take 1 tablet (25 mg total) by mouth every 8 (eight) hours. Patient not taking: Reported on 04/30/2017 10/17/15   Nicholes Mango, MD  lisinopril (PRINIVIL,ZESTRIL) 40 MG tablet Take 40 mg by mouth daily.    Historical Provider, MD  metFORMIN (GLUCOPHAGE-XR) 500 MG 24 hr tablet Take 1,000 mg by mouth daily with supper.     Historical Provider, MD  metoprolol succinate (TOPROL-XL) 100 MG 24 hr  tablet Take 100 mg by mouth daily.    Historical Provider, MD  nystatin cream (MYCOSTATIN) Apply topically 2 (two) times daily. Patient not taking: Reported on 04/30/2017 10/17/15   Nicholes Mango, MD  oxyCODONE (OXY IR/ROXICODONE) 5 MG immediate release tablet Take 1 tablet (5 mg total) by mouth every 4 (four) hours as needed for moderate pain. Patient not taking: Reported on 08/16/2016 10/17/15   Nicholes Mango, MD  pantoprazole (PROTONIX) 40 MG tablet Take 40 mg by mouth daily.    Historical Provider, MD  potassium chloride SA (K-DUR,KLOR-CON) 20 MEQ tablet Take 20 mEq by mouth daily.    Historical Provider, MD  promethazine (PHENERGAN) 25 MG tablet Take 25 mg by mouth every 8 (eight) hours as needed for nausea or vomiting.    Historical Provider, MD  traMADol (ULTRAM) 50 MG tablet Take 1 tablet (50 mg total) by mouth every 6 (six) hours as needed for moderate pain. Patient not taking: Reported on 11/11/2016 08/19/16   Fritzi Mandes, MD  triamcinolone ointment (KENALOG) 0.5 % Apply 1 application topically 2 (two) times daily.    Historical Provider, MD    Allergies Patient has no known allergies.  Family History  Problem Relation Age of Onset  . CAD Mother   . Hypertension Mother   . COPD Father   . Diabetes Sister   . Hypertension Sister     Social History Social History  Substance Use Topics  . Smoking status: Never Smoker  . Smokeless tobacco: Never Used  . Alcohol use No    Constitutional: Negative for fever. Eyes: Negative for visual changes. ENT: Negative for facial injury or neck injury Cardiovascular: Negative for chest injury. Respiratory: Negative for shortness of breath. Negative for chest wall injury. Gastrointestinal: Negative for abdominal pain or injury. Genitourinary: Negative for dysuria. Musculoskeletal: Negative for back injury, negative for arm. + R knee pain Skin: Negative for laceration/abrasions. Neurological: Negative for head  injury.   ____________________________________________   PHYSICAL EXAM:  VITAL SIGNS: ED Triage Vitals  Enc Vitals Group     BP 04/30/17 1200 (!) 230/90     Pulse Rate 04/30/17 1200 87     Resp 04/30/17 1200 16     Temp 04/30/17 1200 97.3 F (36.3 C)     Temp Source 04/30/17 1200 Oral     SpO2 04/30/17 1200 99 %     Weight 04/30/17 1156 300 lb (136.1 kg)     Height 04/30/17 1156 5\' 4"  (1.626 m)     Head Circumference --      Peak Flow --      Pain Score 04/30/17 1156 8     Pain Loc --      Pain Edu? --      Excl. in [place]? --    Constitutional: Alert and oriented. No acute  distress. Does not appear intoxicated. HEENT Head: Normocephalic and atraumatic. Face: No facial bony tenderness. Stable midface Ears: No hemotympanum bilaterally. No Battle sign Eyes: No eye injury. PERRL. No raccoon eyes Nose: Nontender. No epistaxis. No rhinorrhea Mouth/Throat: Mucous membranes are moist. No oropharyngeal blood. No dental injury. Airway patent without stridor. Normal voice. Neck: no C-collar in place. No midline c-spine tenderness.  Cardiovascular: Normal rate, regular rhythm. Normal and symmetric distal pulses are present in all extremities. Pulmonary/Chest: Chest wall is stable and nontender to palpation/compression. Normal respiratory effort. Breath sounds are normal. No crepitus.  Abdominal: Soft, nontender, non distended. Musculoskeletal: R knee has ttp over the anterior proximal tibia, no bruising, or hematoma, no swelling although that is limited due to patient's body habitus, decreased ROM due to pain. Nontender with normal full range of motion in all other extremities. No deformities. No thoracic or lumbar midline spinal tenderness. Pelvis is stable. Skin: Skin is warm, dry and intact. No abrasions or contutions. Psychiatric: Speech and behavior are appropriate. Neurological: Normal speech and language. Moves all extremities to command. No gross focal neurologic deficits are  appreciated.  Glascow Coma Score: 4 - Opens eyes on own 6 - Follows simple motor commands 5 - Alert and oriented GCS: 15   ____________________________________________   LABS (all labs ordered are listed, but only abnormal results are displayed)  Labs Reviewed  CBC - Abnormal; Notable for the following:       Result Value   MCV 78.2 (*)    RDW 16.3 (*)    All other components within normal limits  BASIC METABOLIC PANEL - Abnormal; Notable for the following:    Sodium 134 (*)    Chloride 98 (*)    Glucose, Bld 396 (*)    Creatinine, Ser 1.30 (*)    Calcium 8.7 (*)    GFR calc non Af Amer 41 (*)    GFR calc Af Amer 48 (*)    All other components within normal limits  URINALYSIS, COMPLETE (UACMP) WITH MICROSCOPIC - Abnormal; Notable for the following:    Color, Urine YELLOW (*)    APPearance CLOUDY (*)    Glucose, UA >=500 (*)    Protein, ur 100 (*)    Bacteria, UA FEW (*)    Squamous Epithelial / LPF 0-5 (*)    All other components within normal limits  URINE CULTURE   ____________________________________________  EKG  none  ____________________________________________  RADIOLOGY  XR R knee: 1. Severe tricompartment degenerative change and osteopenia. No acute bony abnormality.  2. Peripheral vascular disease. ____________________________________________   PROCEDURES  Procedure(s) performed: None Procedures Critical Care performed:  None ____________________________________________   INITIAL IMPRESSION / ASSESSMENT AND PLAN / ED COURSE  66 y.o. female with a history of chronic kidney disease, diabetes, hypertension, and stroke who presents for evaluation of right knee pain s/p Mechanical fall. Patient has significant tenderness in the anterior proximal tibia and limited range of motion due to pain. No other injuries based on history and physical exam.Patient is a diabeticand has not taken her medications for the last 24 hours therefore will check blood  work to rule out DKA. We'll get an x-ray of her knee and give her fentanyl for the pain.  Clinical Course as of Apr 30 1624  Wed Apr 30, 2017  1501 XR with no acute fracture or dislocation. Patient able to stand up but unable to ambulate due to pain after multiple rounds of pain meds. Will consult physical therapy and social  worker to help find placement out of the ED for patient.   [CV]  0354 Patient's BP have been persistent elevated. Patient was on multiple antihypertensive medications however she stopped them a few months ago. Will restart her on lisinopril for now and reassess. Patient may need other antihypertensive medications as well.   [CV]    Clinical Course User Index [CV] Rudene Re, MD    Pertinent labs & imaging results that were available during my care of the patient were reviewed by me and considered in my medical decision making (see chart for details).    ____________________________________________   FINAL CLINICAL IMPRESSION(S) / ED DIAGNOSES  Final diagnoses:  Fall, initial encounter  Acute pain of right knee  Acute cystitis without hematuria      NEW MEDICATIONS STARTED DURING THIS VISIT:  New Prescriptions   No medications on file     Note:  This document was prepared using Dragon voice recognition software and may include unintentional dictation errors.    Rudene Re, MD 04/30/17 [phone]

## 2017-04-30 NOTE — Clinical Social Work Placement (Signed)
   CLINICAL SOCIAL WORK PLACEMENT  NOTE  Date:  04/30/2017  Patient Details  Name: Brooke Hopkins MRN: [id number] Date of Birth: 1951-06-10  Clinical Social Work is seeking post-discharge placement for this patient at the [place] level of care (*CSW will initial, date and re-position this form in  chart as items are completed):  Yes   Patient/family provided with [facility] Work Department's list of facilities offering this level of care within the geographic area requested by the patient (or if unable, by the patient's family).  Yes   Patient/family informed of their freedom to choose among providers that offer the needed level of care, that participate in Medicare, Medicaid or managed care program needed by the patient, have an available bed and are willing to accept the patient.  Yes   Patient/family informed of [facility]'s ownership interest in [facility] and [facility], as well as of the fact that they are under no obligation to receive care at these facilities.  PASRR submitted to EDS on       PASRR number received on       Existing PASRR number confirmed on 04/30/17     FL2 transmitted to all facilities in geographic area requested by pt/family on 04/30/17     FL2 transmitted to all facilities within larger geographic area on       Patient informed that his/her managed care company has contracts with or will negotiate with certain facilities, including the following:        Yes   Patient/family informed of bed offers received.  Patient chooses bed at  [facility] (SNF) )     Physician recommends and patient chooses bed at      Patient to be transferred to  [facility] (SNF) ) on 04/30/17.  Patient to be transferred to facility by  [facility] )     Patient family notified on 04/30/17 of transfer.  Name of family member notified:   (Patient's [name] is at bedside and aware of D/C today. )     PHYSICIAN      Additional Comment:    _______________________________________________ Parveen Freehling, Veronia Beets, LCSW 04/30/2017, 4:59 PM

## 2017-04-30 NOTE — Evaluation (Signed)
Physical Therapy Evaluation Patient Details Name: Brooke Hopkins MRN: [id number] DOB: [birth year] Today's Date: 04/30/2017   History of Present Illness  Patient is a 66 y/o female that presents after sustaining fall on RLE, she has had multiple buckling/giving way episodes since that fall last week. Has been in her w/c for multiple days since her last buckle. X-ray was negative for osseus changes.   Clinical Impression  Patient evaluated after falling on her RLE, multiple episodes of buckling since that time. 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 She is now having difficulty with standing/bearing weight on her RLE. Unable to perform valgus/varus stress tests or Lachman's for soft tissue damage given her habitus. Her pain is primarily over tibial tubercle, especially with knee flexion. She is able to momentarily stand with max A and RW, however unable to ambulate as she has been most recently. Would recommend transition to STR for discharge when she is medically appropriate to increase her independence with mobility.     Follow Up Recommendations SNF    Equipment Recommendations  Rolling walker with 5" wheels    Recommendations for Other Services       Precautions / Restrictions Precautions Precautions: Fall Restrictions Weight Bearing Restrictions: No      Mobility  Bed Mobility Overal bed mobility: Needs Assistance Bed Mobility: Supine to Sit;Sit to Supine     Supine to sit: Min assist Sit to supine: Min assist   General bed mobility comments: Patient requires assistance to manage trunk and LEs in and out of bed.   Transfers Overall transfer level: Needs assistance Equipment used: Rolling walker (2 wheeled) Transfers: Sit to/from Stand Sit to Stand: Max assist         General transfer comment: PT made 3 max A attempts, on last attempt patient was able to stand momentarily, but unable to tolerate for  long due to pain.   Ambulation/Gait                Stairs            Wheelchair Mobility    Modified Rankin (Stroke Patients Only)       Balance Overall balance assessment: Needs assistance Sitting-balance support: Feet unsupported;Bilateral upper extremity supported Sitting balance-Leahy Scale: Fair     Standing balance support: Bilateral upper extremity supported Standing balance-Leahy Scale: Zero                               Pertinent Vitals/Pain Pain Assessment:  (Patient does not have any pain at rest, very tender to palpation at the tibial tubercle. )    Home Living Family/patient expects to be discharged to:: Private residence Living Arrangements: Spouse/significant other Available Help at Discharge: Family;Available 24 hours/day Type of Home: House Home Access: Ramped entrance       Home Equipment:  (RW, w/c, Stirrups to help lift her feet in bed, lift-chair)      Prior Function Level of Independence: Independent with assistive device(s)         Comments: Patient has most recently been ambulating 22' with RW.      Hand Dominance        Extremity/Trunk Assessment   Upper Extremity Assessment Upper Extremity Assessment: Overall WFL for tasks assessed    Lower Extremity Assessment Lower Extremity Assessment: RLE deficits/detail RLE Deficits / Details: Pain with knee flexion, tender  to palpation on R tibial tubercle. Could not adequately perform soft tissue evaluation given her body habitus.        Communication   Communication: No difficulties  Cognition Arousal/Alertness: Awake/alert Behavior During Therapy: WFL for tasks assessed/performed Overall Cognitive Status: Within Functional Limits for tasks assessed                                        General Comments General comments (skin integrity, edema, etc.): Notable swelling in RLE compared to LLE     Exercises     Assessment/Plan    PT  Assessment Patient needs continued PT services  PT Problem List Decreased range of motion;Decreased balance;Decreased mobility;Decreased knowledge of use of DME;Decreased activity tolerance;Decreased strength;Pain       PT Treatment Interventions DME instruction;Therapeutic exercise;Balance training;Stair training;Gait training;Neuromuscular re-education;Therapeutic activities    PT Goals (Current goals can be found in the Care Plan section)  Acute Rehab PT Goals Patient Stated Goal: To get stronger and increase her mobility.  PT Goal Formulation: With patient/family Time For Goal Achievement: 05/14/17 Potential to Achieve Goals: Good    Frequency Min 2X/week   Barriers to discharge        Co-evaluation               AM-PAC PT "6 Clicks" Daily Activity  Outcome Measure Difficulty turning over in bed (including adjusting bedclothes, sheets and blankets)?: A Little Difficulty moving from lying on back to sitting on the side of the bed? : A Little Difficulty sitting down on and standing up from a chair with arms (e.g., wheelchair, bedside commode, etc,.)?: Total Help needed moving to and from a bed to chair (including a wheelchair)?: Total Help needed walking in hospital room?: Total Help needed climbing 3-5 steps with a railing? : Total 6 Click Score: 10    End of Session Equipment Utilized During Treatment: Gait belt Activity Tolerance: Patient limited by pain Patient left: in bed;with family/visitor present   PT Visit Diagnosis: Muscle weakness (generalized) (M62.81);Repeated falls (R29.6);Difficulty in walking, not elsewhere classified (R26.2)    Time: 1975-8832 PT Time Calculation (min) (ACUTE ONLY): 15 min   Charges:   PT Evaluation $PT Eval Moderate Complexity: 1 Procedure     PT G Codes:   PT G-Codes **NOT FOR INPATIENT CLASS** Functional Assessment Tool Used: AM-PAC 6 Clicks Basic Mobility Functional Limitation: Mobility: Walking and moving  around Mobility: Walking and Moving Around Current Status (P4982): At least 80 percent but less than 100 percent impaired, limited or restricted Mobility: Walking and Moving Around Goal Status [phone]): At least 80 percent but less than 100 percent impaired, limited or restricted   Royce Macadamia PT, DPT, CSCS     04/30/2017, 5:21 PM

## 2017-04-30 NOTE — ED Notes (Signed)
Attempted to ambulate pt with a walker - pt was able to partially stand but stayed bent at the waist and was unable to take a step or bear weight on her right knee - Dr Alfred Levins notified - pt was wet with urine - changed pt and bed linen - Musician assisted

## 2017-04-30 NOTE — ED Notes (Signed)
Pt assisted on to bedpan to void

## 2017-04-30 NOTE — ED Triage Notes (Signed)
Pt arrived via ems for c/o multiple falls since Sunday - pt fell and hit her right knee on Sunday and since then she has been unable to ambulate and has sat in a wheelchair or laid in the bed since Sunday

## 2017-04-30 NOTE — NC FL2 (Signed)
[place] LEVEL OF CARE SCREENING TOOL     IDENTIFICATION  Patient Name: Brooke Hopkins Birthdate: 26-Dec-1951 Sex: female Admission Date (Current Location): 04/30/2017  [place] and [place] Number:  Engineering geologist and Address:  [facility], [address], [place], [place] [address]      Provider Number: [id number]  Attending Physician Name and Address:  Rudene Re, MD  Relative Name and Phone Number:       Current Level of Care: Hospital Recommended Level of Care: [place] Prior Approval Number:    Date Approved/Denied:   PASRR Number:  ([id number] )  Discharge Plan: SNF    Current Diagnoses: Patient Active Problem List   Diagnosis Date Noted  . Pure hypercholesterolemia 08/17/2016  . Lymphedema 08/17/2016  . Wound infection 08/16/2016  . 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 Pressure ulcer 10/17/2015  . Cellulitis 10/13/2015    Orientation RESPIRATION BLADDER Height & Weight     Self, Time, Situation, Place  Normal Continent Weight: 300 lb (136.1 kg) Height:  5\' 4"  (162.6 cm)  BEHAVIORAL SYMPTOMS/MOOD NEUROLOGICAL BOWEL NUTRITION STATUS   (none)  (none) Continent Diet (Diet: Carb Modified )  AMBULATORY STATUS COMMUNICATION OF NEEDS Skin   Extensive Assist Verbally Normal                       Personal Care Assistance Level of Assistance  Bathing, Feeding, Dressing Bathing Assistance: Limited assistance Feeding assistance: Independent Dressing Assistance: Limited assistance     Functional Limitations Info  Sight, Hearing, Speech Sight Info: Adequate Hearing Info: Adequate Speech Info: Adequate    SPECIAL CARE FACTORS FREQUENCY  PT (By licensed PT), OT (By licensed OT)     PT Frequency:  (5) OT Frequency:  (5)            Contractures      Additional Factors Info  Code Status, Allergies, Insulin Sliding Scale Code Status  Info:  (Full Code. ) Allergies Info:  (No Known Allergies. )   Insulin Sliding Scale Info:  (NovoLog Insulin Injections. )       Current Medications (04/30/2017):  This is the current hospital active medication list Current Facility-Administered Medications  Medication Dose Route Frequency Provider Last Rate Last Dose  . acetaminophen (TYLENOL) tablet 1,000 mg  1,000 mg Oral TID Rudene Re, MD      . cephALEXin [facility]) capsule 500 mg  500 mg Oral TID Rudene Re, MD      . docusate sodium (COLACE) capsule 100 mg  100 mg Oral Daily Rudene Re, MD      . insulin aspart (novoLOG) injection 0-5 Units  0-5 Units Subcutaneous QHS Rudene Re, MD      . insulin aspart (novoLOG) injection 0-9 Units  0-9 Units Subcutaneous TID [facility] Rudene Re, MD      . lisinopril (PRINIVIL,ZESTRIL) tablet 20 mg  20 mg Oral Daily Rudene Re, MD      . oxyCODONE (Oxy IR/ROXICODONE) immediate release tablet 5 mg  5 mg Oral Q6H PRN Rudene Re, MD      . senna [facility]) tablet 8.6 mg  1 tablet Oral QHS Rudene Re, MD       Current Outpatient Prescriptions  Medication Sig Dispense Refill  . insulin NPH-regular Human (NOVOLIN 70/30) (70-30) 100 UNIT/ML injection Inject 35 Units into the skin 2 (two) times daily. (Patient taking differently: Inject 40 Units into the skin  2 (two) times daily. ) 10 mL 11  . aspirin EC 81 MG tablet Take 81 mg by mouth daily.    Marland Kitchen atorvastatin (LIPITOR) 80 MG tablet Take 1 tablet (80 mg total) by mouth daily at 6 PM. (Patient not taking: Reported on 04/30/2017) 30 tablet 0  . cefTAZidime 1 g in dextrose 5 % 50 mL Inject 1 g into the vein every 8 (eight) hours. (Patient not taking: Reported on 04/30/2017) 42 ampule 0  . cloNIDine (CATAPRES - DOSED IN MG/24 HR) 0.2 mg/24hr patch Place 1 patch (0.2 mg total) onto the skin once a week. (Patient not taking: Reported on 04/30/2017) 4 patch 0  . clopidogrel (PLAVIX) 75 MG tablet Take 75 mg by mouth daily.     . collagenase (SANTYL) ointment Apply topically daily. (Patient not taking: Reported on 04/30/2017) 30 g 0  . furosemide (LASIX) 40 MG tablet Take 40 mg by mouth daily.     . hydrALAZINE (APRESOLINE) 25 MG tablet Take 1 tablet (25 mg total) by mouth every 8 (eight) hours. (Patient not taking: Reported on 04/30/2017) 90 tablet 0  . lisinopril (PRINIVIL,ZESTRIL) 40 MG tablet Take 40 mg by mouth daily.    . metFORMIN (GLUCOPHAGE-XR) 500 MG 24 hr tablet Take 1,000 mg by mouth daily with supper.     . metoprolol succinate (TOPROL-XL) 100 MG 24 hr tablet Take 100 mg by mouth daily.    Marland Kitchen nystatin cream (MYCOSTATIN) Apply topically 2 (two) times daily. (Patient not taking: Reported on 04/30/2017) 30 g 0  . oxyCODONE (OXY IR/ROXICODONE) 5 MG immediate release tablet Take 1 tablet (5 mg total) by mouth every 4 (four) hours as needed for moderate pain. (Patient not taking: Reported on 08/16/2016) 30 tablet 0  . pantoprazole (PROTONIX) 40 MG tablet Take 40 mg by mouth daily.    . potassium chloride SA (K-DUR,KLOR-CON) 20 MEQ tablet Take 20 mEq by mouth daily.    . promethazine (PHENERGAN) 25 MG tablet Take 25 mg by mouth every 8 (eight) hours as needed for nausea or vomiting.    . traMADol (ULTRAM) 50 MG tablet Take 1 tablet (50 mg total) by mouth every 6 (six) hours as needed for moderate pain. (Patient not taking: Reported on 11/11/2016) 30 tablet 0  . triamcinolone ointment (KENALOG) 0.5 % Apply 1 application topically 2 (two) times daily.       Discharge Medications: Please see discharge summary for a list of discharge medications.  Relevant Imaging Results:  Relevant Lab Results:   Additional Information  (SSN: [id number])  Jaquin Coy, Veronia Beets, LCSW

## 2017-04-30 NOTE — Clinical Social Work Note (Signed)
Clinical Social Work Assessment  Patient Details  Name: Brooke Hopkins MRN: [id number] Date of Birth: 03/06/1951  Date of referral:  04/30/17               Reason for consult:  Facility Placement                Permission sought to share information with:  Chartered certified accountant granted to share information::  Yes, Verbal Permission Granted  Name::      Brooke Hopkins::   [name]   Relationship::     Contact Information:     Housing/Transportation Living arrangements for the past 2 months:  [name] of Information:  Patient, Spouse Patient Interpreter Needed:  None Criminal Activity/Legal Involvement Pertinent to Current Situation/Hospitalization:  No - Comment as needed Significant Relationships:  Spouse Lives with:  Spouse Do you feel safe going back to the place where you live?  Yes Need for family participation in patient care:  Yes (Comment)  Care giving concerns:  Patient lives in [place] with her husband Brooke Hopkins.    Social Worker assessment / plan:  Holiday representative (CSW) received SNF consult from the ED. Per MD patient does not meet criteria for admission to [facility] however she can't walk, which is not her baseline. PT is recommending SNF. CSW met with patient and her husband Brooke Hopkins was at bedside. Patient was alert and oriented X4. CSW introduced self and explained role of CSW department. Patient reported that her [employer] is her primary insurance because her husband still works and her medicare is secondary. [place] office verified that patient has SNF benefits through [facility]. Per patient she went to [facility] in 2016 and has not been to rehab since that time. Patient and husband are agreeable to rehab placement from the ED. FL2 complete and faxed out. CSW presented bed offer, patient had only 1 offer from [facility]. Patient and husband accepted bed offer from [facility].   Patient is medically  stable for D/C to [facility] today from the ED. Per [facility] admissions coordinator at [facility] patient can come today to room E-14. RN will call report and arrange EMS for transport. CSW sent signed FL2 to [facility] via [place]. Please reconsult if future social work needs arise. CSW signing off.   Employment status:  Retired Health visitor, Managed Care PT Recommendations:  [place] / Referral to community resources:  [place]  Patient/Family's Response to care: Patient and her husband are agreeable for patient to go to [place] [facility] today.   Patient/Family's Understanding of and Emotional Response to Diagnosis, Current Treatment, and Prognosis:  Patient and husband were very pleasant and thanked CSW for assistance.   Emotional Assessment Appearance:  Appears stated age Attitude/Demeanor/Rapport:    Affect (typically observed):  Accepting, Adaptable, Pleasant Orientation:  Oriented to Self, Oriented to Place, Oriented to  Time, Oriented to Situation Alcohol / Substance use:  Not Applicable Psych involvement (Current and /or in the community):  No (Comment)  Discharge Needs  Concerns to be addressed:  Discharge Planning Concerns Readmission within the last 30 days:  No Current discharge risk:  None Barriers to Discharge:  No Barriers Identified   Brooke Hopkins, Brooke Beets, LCSW 04/30/2017, 5:00 PM

## 2017-04-30 NOTE — ED Notes (Signed)
Pt arrived to er soaked in urine from fall Sunday and inability of pt to get to and from the bathroom at home - pt changed at this time - Dr Alfred Levins in room assessing pt

## 2017-04-30 NOTE — Discharge Instructions (Signed)
Pain control: Take tylenol 1000mg every 8 hours. Take 5mg of oxycodone every 6 hours for breakthrough pain. If you need the oxycodone make sure to take one senokot as well to prevent constipation.  Do not drink alcohol, drive or participate in any other potentially dangerous activities while taking this medication as it may make you sleepy. Do not take this medication with any other sedating medications, either prescription or over-the-counter.  

## 2017-04-30 NOTE — ED Notes (Signed)
Pt in and out cath without difficulty - pt tolerated well - assisted by Aldona Bar RN - pt was noted to have thick amounts of white discharge that was cleaned from vaginal area and noted to have irritated skin in the vaginal areas and skin folds of legs

## 2017-05-01 LAB — URINE CULTURE

## 2017-05-29 DIAGNOSIS — I10 Essential (primary) hypertension: Secondary | ICD-10-CM | POA: Diagnosis not present

## 2017-05-29 DIAGNOSIS — Z794 Long term (current) use of insulin: Secondary | ICD-10-CM | POA: Diagnosis not present

## 2017-05-29 DIAGNOSIS — E1142 Type 2 diabetes mellitus with diabetic polyneuropathy: Secondary | ICD-10-CM | POA: Diagnosis not present

## 2017-05-29 DIAGNOSIS — R809 Proteinuria, unspecified: Secondary | ICD-10-CM | POA: Diagnosis not present

## 2017-05-29 DIAGNOSIS — Z9114 Patient's other noncompliance with medication regimen: Secondary | ICD-10-CM | POA: Diagnosis not present

## 2017-05-29 DIAGNOSIS — E1129 Type 2 diabetes mellitus with other diabetic kidney complication: Secondary | ICD-10-CM | POA: Diagnosis not present

## 2017-05-29 DIAGNOSIS — E113313 Type 2 diabetes mellitus with moderate nonproliferative diabetic retinopathy with macular edema, bilateral: Secondary | ICD-10-CM | POA: Diagnosis not present

## 2017-05-29 DIAGNOSIS — M1711 Unilateral primary osteoarthritis, right knee: Secondary | ICD-10-CM | POA: Diagnosis not present

## 2017-05-29 DIAGNOSIS — M79604 Pain in right leg: Secondary | ICD-10-CM | POA: Diagnosis not present

## 2017-05-29 DIAGNOSIS — E1165 Type 2 diabetes mellitus with hyperglycemia: Secondary | ICD-10-CM | POA: Diagnosis not present

## 2017-05-29 DIAGNOSIS — E1121 Type 2 diabetes mellitus with diabetic nephropathy: Secondary | ICD-10-CM | POA: Diagnosis not present

## 2017-05-29 DIAGNOSIS — M25561 Pain in right knee: Secondary | ICD-10-CM | POA: Diagnosis not present

## 2017-06-16 ENCOUNTER — Inpatient Hospital Stay
Admission: EM | Admit: 2017-06-16 | Discharge: 2017-06-20 | DRG: 304 | Disposition: A | Discharge status: Skilled Nursing Facility | Payer: 59 | Attending: Internal Medicine | Admitting: Internal Medicine

## 2017-06-16 ENCOUNTER — Encounter: Payer: Self-pay | Admitting: Emergency Medicine

## 2017-06-16 ENCOUNTER — Emergency Department: Payer: 59

## 2017-06-16 DIAGNOSIS — Z7982 Long term (current) use of aspirin: Secondary | ICD-10-CM | POA: Diagnosis not present

## 2017-06-16 DIAGNOSIS — N390 Urinary tract infection, site not specified: Secondary | ICD-10-CM | POA: Diagnosis present

## 2017-06-16 DIAGNOSIS — I639 Cerebral infarction, unspecified: Secondary | ICD-10-CM | POA: Diagnosis not present

## 2017-06-16 DIAGNOSIS — N3000 Acute cystitis without hematuria: Secondary | ICD-10-CM

## 2017-06-16 DIAGNOSIS — I160 Hypertensive urgency: Secondary | ICD-10-CM | POA: Diagnosis not present

## 2017-06-16 DIAGNOSIS — E1151 Type 2 diabetes mellitus with diabetic peripheral angiopathy without gangrene: Secondary | ICD-10-CM | POA: Diagnosis present

## 2017-06-16 DIAGNOSIS — E7800 Pure hypercholesterolemia, unspecified: Secondary | ICD-10-CM | POA: Diagnosis present

## 2017-06-16 DIAGNOSIS — Z9049 Acquired absence of other specified parts of digestive tract: Secondary | ICD-10-CM

## 2017-06-16 DIAGNOSIS — B961 Klebsiella pneumoniae [K. pneumoniae] as the cause of diseases classified elsewhere: Secondary | ICD-10-CM | POA: Diagnosis present

## 2017-06-16 DIAGNOSIS — Z90710 Acquired absence of both cervix and uterus: Secondary | ICD-10-CM

## 2017-06-16 DIAGNOSIS — Z833 Family history of diabetes mellitus: Secondary | ICD-10-CM

## 2017-06-16 DIAGNOSIS — Z825 Family history of asthma and other chronic lower respiratory diseases: Secondary | ICD-10-CM | POA: Diagnosis not present

## 2017-06-16 DIAGNOSIS — I6350 Cerebral infarction due to unspecified occlusion or stenosis of unspecified cerebral artery: Secondary | ICD-10-CM | POA: Diagnosis not present

## 2017-06-16 DIAGNOSIS — Z993 Dependence on wheelchair: Secondary | ICD-10-CM

## 2017-06-16 DIAGNOSIS — N183 Chronic kidney disease, stage 3 (moderate): Secondary | ICD-10-CM | POA: Diagnosis present

## 2017-06-16 DIAGNOSIS — I129 Hypertensive chronic kidney disease with stage 1 through stage 4 chronic kidney disease, or unspecified chronic kidney disease: Secondary | ICD-10-CM | POA: Diagnosis present

## 2017-06-16 DIAGNOSIS — R29707 NIHSS score 7: Secondary | ICD-10-CM | POA: Diagnosis not present

## 2017-06-16 DIAGNOSIS — Z8249 Family history of ischemic heart disease and other diseases of the circulatory system: Secondary | ICD-10-CM

## 2017-06-16 DIAGNOSIS — R200 Anesthesia of skin: Secondary | ICD-10-CM | POA: Diagnosis not present

## 2017-06-16 DIAGNOSIS — R262 Difficulty in walking, not elsewhere classified: Secondary | ICD-10-CM

## 2017-06-16 DIAGNOSIS — Z7902 Long term (current) use of antithrombotics/antiplatelets: Secondary | ICD-10-CM

## 2017-06-16 DIAGNOSIS — E1122 Type 2 diabetes mellitus with diabetic chronic kidney disease: Secondary | ICD-10-CM | POA: Diagnosis present

## 2017-06-16 DIAGNOSIS — Z794 Long term (current) use of insulin: Secondary | ICD-10-CM

## 2017-06-16 DIAGNOSIS — E860 Dehydration: Secondary | ICD-10-CM | POA: Diagnosis present

## 2017-06-16 DIAGNOSIS — R29898 Other symptoms and signs involving the musculoskeletal system: Secondary | ICD-10-CM

## 2017-06-16 DIAGNOSIS — G8194 Hemiplegia, unspecified affecting left nondominant side: Secondary | ICD-10-CM | POA: Diagnosis present

## 2017-06-16 LAB — CBC
HCT: 29.9 % — ABNORMAL LOW (ref 35.0–47.0)
Hemoglobin: 9.9 g/dL — ABNORMAL LOW (ref 12.0–16.0)
MCH: 26.6 pg (ref 26.0–34.0)
MCHC: 33.1 g/dL (ref 32.0–36.0)
MCV: 80.5 fL (ref 80.0–100.0)
Platelets: 300 10*3/uL (ref 150–440)
RBC: 3.71 MIL/uL — ABNORMAL LOW (ref 3.80–5.20)
RDW: 15.4 % — ABNORMAL HIGH (ref 11.5–14.5)
WBC: 7.6 10*3/uL (ref 3.6–11.0)

## 2017-06-16 LAB — GASTROINTESTINAL PANEL BY PCR, STOOL (REPLACES STOOL CULTURE)

## 2017-06-16 LAB — HEPATIC FUNCTION PANEL
ALT: 13 U/L — ABNORMAL LOW (ref 14–54)
AST: 15 U/L (ref 15–41)
Albumin: 3.2 g/dL — ABNORMAL LOW (ref 3.5–5.0)
Alkaline Phosphatase: 68 U/L (ref 38–126)
Bilirubin, Direct: <0.1 mg/dL — ABNORMAL LOW (ref 0.1–0.5)
Total Bilirubin: 0.5 mg/dL (ref 0.3–1.2)
Total Protein: 6.7 g/dL (ref 6.5–8.1)

## 2017-06-16 LAB — URINALYSIS, COMPLETE (UACMP) WITH MICROSCOPIC
Bilirubin Urine: NEGATIVE
Glucose, UA: NEGATIVE mg/dL
Ketones, ur: NEGATIVE mg/dL
Nitrite: NEGATIVE
Protein, ur: 100 mg/dL — AB
Specific Gravity, Urine: 1.017 (ref 1.005–1.030)
pH: 5 (ref 5.0–8.0)

## 2017-06-16 LAB — TROPONIN I: Troponin I: <0.03 ng/mL (ref <0.03)

## 2017-06-16 LAB — BASIC METABOLIC PANEL
Anion gap: 5 (ref 5–15)
BUN: 28 mg/dL — ABNORMAL HIGH (ref 6–20)
CO2: 27 mmol/L (ref 22–32)
Calcium: 8.6 mg/dL — ABNORMAL LOW (ref 8.9–10.3)
Chloride: 106 mmol/L (ref 101–111)
Creatinine, Ser: 1.14 mg/dL — ABNORMAL HIGH (ref 0.44–1.00)
GFR calc Af Amer: 56 mL/min — ABNORMAL LOW (ref >60)
GFR calc non Af Amer: 49 mL/min — ABNORMAL LOW (ref >60)
Glucose, Bld: 164 mg/dL — ABNORMAL HIGH (ref 65–99)
Potassium: 4.4 mmol/L (ref 3.5–5.1)
Sodium: 138 mmol/L (ref 135–145)

## 2017-06-16 LAB — GLUCOSE, CAPILLARY
Glucose-Capillary: 114 mg/dL — ABNORMAL HIGH (ref 65–99)
Glucose-Capillary: 156 mg/dL — ABNORMAL HIGH (ref 65–99)

## 2017-06-16 LAB — C DIFFICILE QUICK SCREEN W PCR REFLEX
C Diff antigen: NEGATIVE
C Diff interpretation: NOT DETECTED
C Diff toxin: NEGATIVE

## 2017-06-16 MED ORDER — ACETAMINOPHEN 650 MG RE SUPP: 650.0000 mg | Freq: Four times a day (QID) | RECTAL | Status: DC | PRN

## 2017-06-16 MED ORDER — ASPIRIN EC 81 MG PO TBEC
81.0000 mg | DELAYED_RELEASE_TABLET | Freq: Every day | ORAL | Status: DC
  Administered 2017-06-16 – 2017-06-20 (×5): 81 mg via ORAL
  Filled 2017-06-16 (×5): qty 1

## 2017-06-16 MED ORDER — SODIUM CHLORIDE 0.9 % IV BOLUS (SEPSIS)
1000.0000 mL | Freq: Once | INTRAVENOUS | Status: AC
  Administered 2017-06-16: 1000 mL via INTRAVENOUS

## 2017-06-16 MED ORDER — CLOPIDOGREL BISULFATE 75 MG PO TABS
75.0000 mg | ORAL_TABLET | Freq: Every day | ORAL | Status: DC
  Administered 2017-06-16 – 2017-06-20 (×5): 75 mg via ORAL
  Filled 2017-06-16 (×5): qty 1

## 2017-06-16 MED ORDER — HYDRALAZINE HCL 20 MG/ML IJ SOLN
10.0000 mg | INTRAMUSCULAR | Status: DC | PRN
Start: 2017-06-16 — End: 2017-06-20
  Administered 2017-06-16 – 2017-06-17 (×3): 10 mg via INTRAVENOUS
  Filled 2017-06-16 (×3): qty 1

## 2017-06-16 MED ORDER — ENOXAPARIN SODIUM 40 MG/0.4ML ~~LOC~~ SOLN
40.0000 mg | Freq: Two times a day (BID) | SUBCUTANEOUS | Status: DC
  Administered 2017-06-16 – 2017-06-20 (×8): 40 mg via SUBCUTANEOUS
  Filled 2017-06-16 (×8): qty 0.4

## 2017-06-16 MED ORDER — INSULIN ASPART 100 UNIT/ML ~~LOC~~ SOLN
0.0000 [IU] | Freq: Three times a day (TID) | SUBCUTANEOUS | Status: DC
Start: 2017-06-17 — End: 2017-06-20
  Administered 2017-06-17: 2 [IU] via SUBCUTANEOUS
  Administered 2017-06-17: 3 [IU] via SUBCUTANEOUS
  Administered 2017-06-17: 2 [IU] via SUBCUTANEOUS
  Administered 2017-06-18: 3 [IU] via SUBCUTANEOUS
  Administered 2017-06-18: 8 [IU] via SUBCUTANEOUS
  Administered 2017-06-18: 3 [IU] via SUBCUTANEOUS
  Administered 2017-06-19: 5 [IU] via SUBCUTANEOUS
  Administered 2017-06-19 – 2017-06-20 (×3): 3 [IU] via SUBCUTANEOUS
  Filled 2017-06-16 (×10): qty 1

## 2017-06-16 MED ORDER — ACETAMINOPHEN 325 MG PO TABS: 650.0000 mg | ORAL_TABLET | Freq: Four times a day (QID) | ORAL | Status: DC | PRN

## 2017-06-16 MED ORDER — CLONIDINE HCL 0.2 MG/24HR TD PTWK: 0.2000 mg | MEDICATED_PATCH | TRANSDERMAL | Status: DC

## 2017-06-16 MED ORDER — DEXTROSE 5 % IV SOLN
1.0000 g | INTRAVENOUS | Status: DC
  Administered 2017-06-17 – 2017-06-18 (×2): 1 g via INTRAVENOUS
  Filled 2017-06-16 (×2): qty 10

## 2017-06-16 MED ORDER — ONDANSETRON HCL 4 MG PO TABS: 4.0000 mg | ORAL_TABLET | Freq: Four times a day (QID) | ORAL | Status: DC | PRN

## 2017-06-16 MED ORDER — INSULIN ASPART 100 UNIT/ML ~~LOC~~ SOLN
0.0000 [IU] | Freq: Every day | SUBCUTANEOUS | Status: DC
  Administered 2017-06-17: 2 [IU] via SUBCUTANEOUS
  Administered 2017-06-19: 3 [IU] via SUBCUTANEOUS
  Filled 2017-06-16 (×2): qty 1

## 2017-06-16 MED ORDER — LABETALOL HCL 5 MG/ML IV SOLN
INTRAVENOUS | Status: AC
  Filled 2017-06-16: qty 4

## 2017-06-16 MED ORDER — DEXTROSE 5 % IV SOLN
1.0000 g | Freq: Once | INTRAVENOUS | Status: AC
  Administered 2017-06-16: 1 g via INTRAVENOUS
  Filled 2017-06-16: qty 10

## 2017-06-16 MED ORDER — HYDRALAZINE HCL 25 MG PO TABS
25.0000 mg | ORAL_TABLET | Freq: Three times a day (TID) | ORAL | Status: DC
  Administered 2017-06-16 – 2017-06-19 (×7): 25 mg via ORAL
  Filled 2017-06-16 (×8): qty 1

## 2017-06-16 MED ORDER — ONDANSETRON HCL 4 MG/2ML IJ SOLN
4.0000 mg | Freq: Four times a day (QID) | INTRAMUSCULAR | Status: DC | PRN
  Administered 2017-06-17: 4 mg via INTRAVENOUS
  Filled 2017-06-16: qty 2

## 2017-06-16 MED ORDER — OXYBUTYNIN CHLORIDE 5 MG PO TABS
5.0000 mg | ORAL_TABLET | Freq: Two times a day (BID) | ORAL | Status: DC
  Administered 2017-06-16 – 2017-06-20 (×8): 5 mg via ORAL
  Filled 2017-06-16 (×8): qty 1

## 2017-06-16 MED ORDER — OXYCODONE HCL 5 MG PO TABS
5.0000 mg | ORAL_TABLET | Freq: Three times a day (TID) | ORAL | Status: DC | PRN
  Administered 2017-06-17: 5 mg via ORAL
  Filled 2017-06-16 (×2): qty 1

## 2017-06-16 MED ORDER — SODIUM CHLORIDE 0.9 % IV SOLN
INTRAVENOUS | Status: DC
  Administered 2017-06-16 – 2017-06-17 (×2): via INTRAVENOUS

## 2017-06-16 MED ORDER — SENNOSIDES-DOCUSATE SODIUM 8.6-50 MG PO TABS: 1.0000 | ORAL_TABLET | Freq: Every evening | ORAL | Status: DC | PRN

## 2017-06-16 NOTE — Progress Notes (Signed)
Anticoagulation monitoring(Lovenox):  66 yo female ordered Lovenox 40 mg Q24h  Filed Weights   06/16/17 1318 06/16/17 1856  Weight: 300 lb (136.1 kg) 280 lb 14.4 oz (127.4 kg)   BMI 47.1   Lab Results  Component Value Date   CREATININE 1.14 (H) 06/16/2017   CREATININE 1.30 (H) 04/30/2017   CREATININE 1.17 (H) 08/17/2016   Estimated Creatinine Clearance: 66.4 mL/min (A) (by C-G formula based on SCr of 1.14 mg/dL (H)). Hemoglobin & Hematocrit     Component Value Date/Time   HGB 9.9 (L) 06/16/2017 1321   HCT 29.9 (L) 06/16/2017 1321     Per Protocol for Patient with estCrcl > 30 ml/min and BMI > 40, will transition to Lovenox 40 mg Q12h.

## 2017-06-16 NOTE — ED Notes (Signed)
Pt reports that her pcp told her she should stop taking metoprolol and lisinopril in April because bp readings were normal. She has been checking it regularly and it has been normal until yesterday.

## 2017-06-16 NOTE — ED Triage Notes (Signed)
Pt via ems from home with increasing weakness since a fall on Friday; as of last night, she is unable to bear weight (she normally can help her husband with pivoting and transfer to and from wheelchair). Pt states she has been incontinent of bladder for a while, and now her bowels are incontinent as well. Pt states that she can't feel when she has gone. NAD noted.

## 2017-06-16 NOTE — ED Notes (Signed)
Pt states she has been without her BP meds for a while. Currently has Clonidine patch in place

## 2017-06-16 NOTE — ED Provider Notes (Signed)
[facility] Emergency Department Provider Note  ____________________________________________  Time seen: Approximately 1:32 PM  I have reviewed the triage vital signs and the nursing notes.   HISTORY  Chief Complaint Weakness    HPI Brooke Hopkins is a 66 y.o. 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 In the beginning of April, the patient was discharged from the hospital to rehabilitation facility due to inability to walk after a fall. 2 weeks ago, she returned home and has been doing fairly well since then, using a wheelchair but able to stand and pivot. On Friday, the patient was unable to pivot due to left leg weakness and numbness worse than usual, and fell forward into her wheelchair but did not injure herself or loose consciousness. In addition, she had an episode of fecal incontinence, but denies any saddle anesthesia. Since last night, the patient has had generalized weakness and has been unable to stand or pivot, even with assistance.She has had multiple episodes of loose watery stool without abdominal pain, nausea or vomiting, or fever. She is not had any dysuria. No chest pain, shortness of breath, palpitations, lightheadedness or syncope.   Past Medical History:  Diagnosis Date  . Chronic kidney disease   . Diabetes mellitus   . Hypertension   . Stroke [facility])     Patient Active Problem List   Diagnosis Date Noted  . Pure hypercholesterolemia 08/17/2016  . Lymphedema 08/17/2016  . Wound infection 08/16/2016  . 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 Pressure ulcer 10/17/2015  . Cellulitis 10/13/2015    Past Surgical History:  Procedure Laterality Date  . ABDOMINAL HYSTERECTOMY    . CHOLECYSTECTOMY    . HAMMER TOE SURGERY    . KNEE ARTHROSCOPY    . LUMBAR DISC SURGERY  11/07/11  . LUMBAR WOUND DEBRIDEMENT  11/27/2011    Procedure: LUMBAR WOUND DEBRIDEMENT;  Surgeon: Eustace Moore;  Location: [place] NEURO ORS;  Service: Neurosurgery;  Laterality: N/A;  . TONSILLECTOMY      Current Outpatient Rx  . Order #: [id number] Class: Historical Med  . Order #: [id number] Class: Print  . Order #: [id number] Class: Normal  . Order #: [id number] Class: Print  . Order #: [id number] Class: Historical Med  . Order #: [id number] Class: Print  . Order #: [id number] Class: Historical Med  . Order #: [id number] Class: Print  . Order #: [id number] Class: Normal  . Order #: [id number] Class: Historical Med  . Order #: [id number] Class: Historical Med  . Order #: [id number] Class: Historical Med  . Order #: [id number] Class: Print  . Order #: [id number] Class: Print  . Order #: [id number] Class: Historical Med  . Order #: [id number] Class: Historical Med  . Order #: [id number] Class: Historical Med  . Order #: [id number] Class: Normal  . Order #: [id number] Class: Historical Med    Allergies Patient has no known allergies.  Family History  Problem Relation Age of Onset  . CAD Mother   . Hypertension Mother   . COPD Father   . Diabetes Sister   . Hypertension Sister     Social History Social History  Substance Use Topics  . Smoking status: Never Smoker  . Smokeless tobacco: Never Used  . Alcohol use No    Review of Systems Constitutional: No fever/chills.No lightheadedness or syncope. Positive mechanical fall. No injury. No loss of consciousness. Eyes: No visual changes. No blurred or double vision. ENT: No sore throat. No congestion or rhinorrhea. Cardiovascular: Denies  chest pain. Denies palpitations. Respiratory: Denies shortness of breath.  No cough. Gastrointestinal: No abdominal pain.  No nausea, no vomiting.  Positive diarrhea.  No constipation. Genitourinary: Negative for dysuria. Musculoskeletal: Negative for back pain. Skin: Negative for rash. Neurological: Negative for headaches. Acute on chronic left lower 70 weakness and numbness. No  saddle anesthesia.    ____________________________________________   PHYSICAL EXAM:  VITAL SIGNS: ED Triage Vitals  Enc Vitals Group     BP 06/16/17 1317 (!) 197/63     Pulse Rate 06/16/17 1317 79     Resp 06/16/17 1317 16     Temp 06/16/17 1317 98.4 F (36.9 C)     Temp Source 06/16/17 1317 Oral     SpO2 06/16/17 1317 98 %     Weight 06/16/17 1318 300 lb (136.1 kg)     Height 06/16/17 1318 5\' 7"  (1.702 m)     Head Circumference --      Peak Flow --      Pain Score 06/16/17 1316 5     Pain Loc --      Pain Edu? --      Excl. in [place]? --     Constitutional: Alert and oriented. Chronically ill appearing and in no acute distress. Answers questions appropriately. Eyes: Conjunctivae are normal.  EOMI. No scleral icterus. Head: Atraumatic. Nose: No congestion/rhinnorhea. Mouth/Throat: Mucous membranes are moist.  Neck: No stridor.  Supple.  No meningismus. Cardiovascular: Normal rate, regular rhythm. No murmurs, rubs or gallops.  Respiratory: Normal respiratory effort.  No accessory muscle use or retractions. Lungs CTAB.  No wheezes, rales or ronchi. Gastrointestinal: Soft, nontender and nondistended.  No guarding or rebound.  No peritoneal signs. Musculoskeletal: Bilateral symmetric lower extremity edema. Neurologic:  A&Ox3.  Speech is clear.  Face and smile are symmetric.  EOMI.  4+ out of 5 bilateral hip flexors, 5 out of 5 dorsiflexion and plantar flexion. Decreased sensation to light touch in the left lower extremity. Normal sensation in the right lower extremity, bilateral arms and face. 5 out of 5 grip strength bilaterally. Skin:  Skin is warm, dry and intact. No rash noted. Psychiatric: Mood and affect are normal. Speech and behavior are normal.  Normal judgement.  ____________________________________________   LABS (all labs ordered are listed, but only abnormal results are displayed)  Labs Reviewed  BASIC METABOLIC PANEL - Abnormal; Notable for the following:        Result Value   Glucose, Bld 164 (*)    BUN 28 (*)    Creatinine, Ser 1.14 (*)    Calcium 8.6 (*)    GFR calc non Af Amer 49 (*)    GFR calc Af Amer 56 (*)    All other components within normal limits  CBC - Abnormal; Notable for the following:    RBC 3.71 (*)    Hemoglobin 9.9 (*)    HCT 29.9 (*)    RDW 15.4 (*)    All other components within normal limits  URINALYSIS, COMPLETE (UACMP) WITH MICROSCOPIC - Abnormal; Notable for the following:    Color, Urine YELLOW (*)    APPearance CLOUDY (*)    Hgb urine dipstick SMALL (*)    Protein, ur 100 (*)    Leukocytes, UA LARGE (*)    Bacteria, UA MANY (*)    Squamous Epithelial / LPF 6-30 (*)    All other components within normal limits  HEPATIC FUNCTION PANEL - Abnormal; Notable for the following:    Albumin  3.2 (*)    ALT 13 (*)    Bilirubin, Direct <0.1 (*)    All other components within normal limits  CULTURE, BLOOD (ROUTINE X 2)  CULTURE, BLOOD (ROUTINE X 2)  URINE CULTURE  C DIFFICILE QUICK SCREEN W PCR REFLEX  GASTROINTESTINAL PANEL BY PCR, STOOL (REPLACES STOOL CULTURE)  TROPONIN I  CBG MONITORING, ED   ____________________________________________  EKG  ED ECG REPORT I, Eula Listen, the attending physician, personally viewed and interpreted this ECG.   Date: 06/16/2017  EKG Time: 1319  Rate: 79  Rhythm: normal sinus rhythm  Axis: normal  Intervals:none  ST&T Change: No STEMI  ____________________________________________  RADIOLOGY  Ct Head Wo Contrast  Result Date: 06/16/2017 CLINICAL DATA:  Stroke in 2016 and residual weakness on left side. Increased weakness since Friday. EXAM: CT HEAD WITHOUT CONTRAST TECHNIQUE: Contiguous axial images were obtained from the base of the skull through the vertex without intravenous contrast. COMPARISON:  02/18/2017 FINDINGS: Brain: No evidence for acute hemorrhage, mass lesion, midline shift, hydrocephalus or large infarct. Subtle low-density in the white matter  is suggestive for chronic changes. Old infarct involving the left basal ganglia, left external capsule and possibly part of the left caudate. Subtle low-density in the right thalamus compatible with old infarct. Vascular: No hyperdense vessel or unexpected calcification. Skull: Normal. Negative for fracture or focal lesion. Sinuses/Orbits: No acute finding. Other: None. IMPRESSION: No acute intracranial abnormality. Evidence for old ischemic changes or insults. Electronically Signed   By: Markus Daft M.D.   On: 06/16/2017 14:01    ____________________________________________   PROCEDURES  Procedure(s) performed: None  Procedures  Critical Care performed: No ____________________________________________   INITIAL IMPRESSION / ASSESSMENT AND PLAN / ED COURSE  Pertinent labs & imaging results that were available during my care of the patient were reviewed by me and considered in my medical decision making (see chart for details).  66 y.o. female with multiple chronic comorbidities, history of stroke, presenting with generalized weakness, but also acute on chronic left lower extremity numbness and weakness worse than baseline; patient also has diarrhea and recent rehabilitation stay. Overall, the patient is hypertensive, but afebrile. There are multiple possible etiologies for his symptoms including C. difficile, UTI, or new stroke although her symptoms started on Friday. She is not a candidate for TPA given the onset of symptoms 4 days ago.  Plan admission for full evaluation and treatment.  ----------------------------------------- 3:23 PM on 06/16/2017 -----------------------------------------  The patient has no acute findings on her CT scan. She does have a UTI. I'm still waiting for her stool studies to come back. At this time, the patient is ready for admission to the hospital for further evaluation and treatment.  ____________________________________________  FINAL CLINICAL  IMPRESSION(S) / ED DIAGNOSES  Final diagnoses:  Left leg weakness  Acute cystitis without hematuria  Left leg numbness  Inability to walk         NEW MEDICATIONS STARTED DURING THIS VISIT:  New Prescriptions   No medications on file      Eula Listen, MD 06/16/17 1523

## 2017-06-16 NOTE — H&P (Signed)
[facility] NAME: Brooke Hopkins    MR#:  [id number]  DATE OF BIRTH:  [birth year]  DATE OF ADMISSION:  06/16/2017  PRIMARY CARE PHYSICIAN: Leonel Ramsay, MD   REQUESTING/REFERRING PHYSICIAN:   CHIEF COMPLAINT:   Chief Complaint  Patient presents with  . Weakness    HISTORY OF PRESENT ILLNESS: Brooke Hopkins  is a 66 y.o. female with a known history of Chronic kidney disease, type 2 diabetes mellitus, hypertension, CVA presented to the emergency room with generalized weakness. Patient complains of dysuria and foul-smelling urine. She was at a rehabilitation center for more than 2 weeks and has been discharged to home. On friday her legs gave away and she couldn't balance and landed on the floor. During the workup in the emergency room her urine showed infection and she received IV antibiotics. She has a walker, cane and wheelchair at home. No history of any head injury. No complaints of any chest pain. No numbness and tingling in any part of the body. In the emergency room her blood pressure has been elevated systolic more than 546 mm of hg. She was worked up with CT head which showed no acute intracranial abnormality. Hospitalist service was consulted for further care of the patient.  PAST MEDICAL HISTORY:   Past Medical History:  Diagnosis Date  . Chronic kidney disease   . Diabetes mellitus   . Hypertension   . Stroke [facility])     PAST SURGICAL HISTORY: Past Surgical History:  Procedure Laterality Date  . ABDOMINAL HYSTERECTOMY    . CHOLECYSTECTOMY    . HAMMER TOE SURGERY    . KNEE ARTHROSCOPY    . LUMBAR DISC SURGERY  11/07/11  . LUMBAR WOUND DEBRIDEMENT  11/27/2011   Procedure: LUMBAR WOUND DEBRIDEMENT;  Surgeon: Eustace Moore;  Location: [place] NEURO ORS;  Service: Neurosurgery;  Laterality: N/A;  . TONSILLECTOMY      SOCIAL HISTORY:  Social History  Substance Use Topics  . Smoking status: Never Smoker  .  Smokeless tobacco: Never Used  . Alcohol use No    FAMILY HISTORY:  Family History  Problem Relation Age of Onset  . CAD Mother   . Hypertension Mother   . COPD Father   . Diabetes Sister   . Hypertension Sister     DRUG ALLERGIES: No Known Allergies  REVIEW OF SYSTEMS:   CONSTITUTIONAL: No fever, has weakness.  EYES: No blurred or double vision.  EARS, NOSE, AND THROAT: No tinnitus or ear pain.  RESPIRATORY: No cough, shortness of breath, wheezing or hemoptysis.  CARDIOVASCULAR: No chest pain, orthopnea, edema.  GASTROINTESTINAL: No nausea, vomiting, diarrhea or abdominal pain.  GENITOURINARY: Has dysuria, no hematuria.  ENDOCRINE: No polyuria, nocturia,  HEMATOLOGY: No anemia, easy bruising or bleeding SKIN: No rash or lesion. MUSCULOSKELETAL: No joint pain or arthritis.   NEUROLOGIC: No tingling, numbness, weakness.  PSYCHIATRY: No anxiety or depression.   MEDICATIONS AT HOME:  Prior to Admission medications   Medication Sig Start Date End Date Taking? Authorizing Provider  aspirin EC 81 MG tablet Take 81 mg by mouth daily.   Yes [provider]  cloNIDine (CATAPRES - DOSED IN MG/24 HR) 0.2 mg/24hr patch Place 1 patch (0.2 mg total) onto the skin once a week. 10/17/15  Yes Gouru, Illene Silver, MD  clopidogrel (PLAVIX) 75 MG tablet Take 75 mg by mouth daily.   Yes [provider]  insulin NPH-regular Human (NOVOLIN 70/30) (70-30)  100 UNIT/ML injection Inject 35 Units into the skin 2 (two) times daily. Patient taking differently: Inject 40 Units into the skin 2 (two) times daily.  08/19/16  Yes Fritzi Mandes, MD  oxybutynin (DITROPAN) 5 MG tablet Take 5 mg by mouth 2 (two) times daily.   Yes [provider]  atorvastatin (LIPITOR) 80 MG tablet Take 1 tablet (80 mg total) by mouth daily at 6 PM. Patient not taking: Reported on 04/30/2017 10/17/15   Nicholes Mango, MD  cefTAZidime 1 g in dextrose 5 % 50 mL Inject 1 g into the vein every 8 (eight) hours. Patient  not taking: Reported on 04/30/2017 08/19/16   Fritzi Mandes, MD  collagenase (SANTYL) ointment Apply topically daily. Patient not taking: Reported on 04/30/2017 10/17/15   Nicholes Mango, MD  hydrALAZINE (APRESOLINE) 25 MG tablet Take 1 tablet (25 mg total) by mouth every 8 (eight) hours. Patient not taking: Reported on 04/30/2017 10/17/15   Nicholes Mango, MD  nystatin cream (MYCOSTATIN) Apply topically 2 (two) times daily. Patient not taking: Reported on 04/30/2017 10/17/15   Nicholes Mango, MD  oxyCODONE (ROXICODONE) 5 MG immediate release tablet Take 1 tablet (5 mg total) by mouth every 8 (eight) hours as needed. Patient not taking: Reported on 06/16/2017 04/30/17 04/30/18  Rudene Re, MD  traMADol (ULTRAM) 50 MG tablet Take 1 tablet (50 mg total) by mouth every 6 (six) hours as needed for moderate pain. Patient not taking: Reported on 11/11/2016 08/19/16   Fritzi Mandes, MD      PHYSICAL EXAMINATION:   VITAL SIGNS: Blood pressure (!) 218/91, pulse 75, temperature 98.4 F (36.9 C), temperature source Oral, resp. rate 18, height 5\' 7"  (1.702 m), weight 136.1 kg (300 lb), SpO2 98 %.  GENERAL:  66 y.o.-year-old patient lying in the bed with no acute distress.  EYES: Pupils equal, round, reactive to light and accommodation. No scleral icterus. Extraocular muscles intact.  HEENT: Head atraumatic, normocephalic. Oropharynx dry and nasopharynx clear.  NECK:  Supple, no jugular venous distention. No thyroid enlargement, no tenderness.  LUNGS: Normal breath sounds bilaterally, no wheezing, rales,rhonchi or crepitation. No use of accessory muscles of respiration.  CARDIOVASCULAR: S1, S2 normal. No murmurs, rubs, or gallops.  ABDOMEN: Soft, nontender, nondistended. Bowel sounds present. No organomegaly or mass.  EXTREMITIES: No pedal edema, cyanosis, or clubbing.  NEUROLOGIC: Cranial nerves II through XII are intact. Muscle strength 5/5 in all extremities. Sensation intact. Gait not checked.  PSYCHIATRIC: The  patient is alert and oriented x 3.  SKIN: No obvious rash, lesion, or ulcer.   LABORATORY PANEL:   CBC  Recent Labs Lab 06/16/17 1321  WBC 7.6  HGB 9.9*  HCT 29.9*  PLT 300  MCV 80.5  MCH 26.6  MCHC 33.1  RDW 15.4*   ------------------------------------------------------------------------------------------------------------------  Chemistries   Recent Labs Lab 06/16/17 1321  NA 138  K 4.4  CL 106  CO2 27  GLUCOSE 164*  BUN 28*  CREATININE 1.14*  CALCIUM 8.6*  AST 15  ALT 13*  ALKPHOS 68  BILITOT 0.5   ------------------------------------------------------------------------------------------------------------------ estimated creatinine clearance is 69.1 mL/min (A) (by C-G formula based on SCr of 1.14 mg/dL (H)). ------------------------------------------------------------------------------------------------------------------ No results for input(s): TSH, T4TOTAL, T3FREE, THYROIDAB in the last 72 hours.  Invalid input(s): FREET3   Coagulation profile No results for input(s): INR, PROTIME in the last 168 hours. ------------------------------------------------------------------------------------------------------------------- No results for input(s): DDIMER in the last 72 hours. -------------------------------------------------------------------------------------------------------------------  Cardiac Enzymes  Recent Labs Lab 06/16/17 1321  TROPONINI <0.03   ------------------------------------------------------------------------------------------------------------------  Invalid input(s): POCBNP  ---------------------------------------------------------------------------------------------------------------  Urinalysis    Component Value Date/Time   COLORURINE YELLOW (A) 06/16/2017 1429   APPEARANCEUR CLOUDY (A) 06/16/2017 1429   LABSPEC 1.017 06/16/2017 1429   PHURINE 5.0 06/16/2017 1429   GLUCOSEU NEGATIVE 06/16/2017 1429   HGBUR SMALL (A)  06/16/2017 1429   BILIRUBINUR NEGATIVE 06/16/2017 1429   KETONESUR NEGATIVE 06/16/2017 1429   PROTEINUR 100 (A) 06/16/2017 1429   NITRITE NEGATIVE 06/16/2017 1429   LEUKOCYTESUR LARGE (A) 06/16/2017 1429     RADIOLOGY: Ct Head Wo Contrast  Result Date: 06/16/2017 CLINICAL DATA:  Stroke in 2016 and residual weakness on left side. Increased weakness since Friday. EXAM: CT HEAD WITHOUT CONTRAST TECHNIQUE: Contiguous axial images were obtained from the base of the skull through the vertex without intravenous contrast. COMPARISON:  02/18/2017 FINDINGS: Brain: No evidence for acute hemorrhage, mass lesion, midline shift, hydrocephalus or large infarct. Subtle low-density in the white matter is suggestive for chronic changes. Old infarct involving the left basal ganglia, left external capsule and possibly part of the left caudate. Subtle low-density in the right thalamus compatible with old infarct. Vascular: No hyperdense vessel or unexpected calcification. Skull: Normal. Negative for fracture or focal lesion. Sinuses/Orbits: No acute finding. Other: None. IMPRESSION: No acute intracranial abnormality. Evidence for old ischemic changes or insults. Electronically Signed   By: Markus Daft M.D.   On: 06/16/2017 14:01    EKG: Orders placed or performed during the hospital encounter of 06/16/17  . ED EKG  . ED EKG  . EKG 12-Lead  . EKG 12-Lead    IMPRESSION AND PLAN: 66 year old female patient with history of diabetes mellitus, chronic kidney disease, hypertension presented to the emergency room with elevated blood pressure, dysuria and weakness. Admitting diagnosis 1. Hypertensive urgency 2. Dehydration 3. Urinary tract infection 4. Diabetes mellitus 5. Chronic kidney disease Treatment plan Admit patient to medical floor Control blood pressure with though clonidine, beta blocker and IV hydralazine as needed IV Rocephin antibiotic 1 g daily Medical management of diabetes mellitus Sequential  compression devices to lower extremities Supportive care.  All the records are reviewed and case discussed with ED provider. Management plans discussed with the patient, family and they are in agreement.  CODE STATUS: FULL CODE Code Status History    Date Active Date Inactive Code Status Order ID Comments User Context   08/16/2016  8:29 PM 08/20/2016  2:25 AM Full Code [id number]  Demetrios Loll, MD Inpatient   10/13/2015 10:10 PM 10/17/2015  6:31 PM Full Code [id number]  Aldean Jewett, MD Inpatient       TOTAL TIME TAKING CARE OF THIS PATIENT: 50 minutes.    Saundra Shelling M.D on 06/16/2017 at 5:19 PM  Between 7am to 6pm - Pager - [phone]  After 6pm go to [email or web address] - password [email or web address] [facility]  [place] [facility]  Office  [phone]  CC: Primary care physician; Leonel Ramsay, MD

## 2017-06-16 NOTE — Progress Notes (Signed)
Pharmacy Antibiotic Note  Brooke Hopkins is a 66 y.o. female admitted on 06/16/2017 with UTI.  Pharmacy has been consulted for Ceftriaxone dosing.  Plan: Patient to receive Ceftriaxone 1gram IV x 1 in ER. Will continue with Ceftriaxone 1 gram IV q24h.  Height: 5\' 7"  (170.2 cm) Weight: 300 lb (136.1 kg) IBW/kg (Calculated) : 61.6  Temp (24hrs), Avg:98.4 F (36.9 C), Min:98.4 F (36.9 C), Max:98.4 F (36.9 C)   Recent Labs Lab 06/16/17 1321  WBC 7.6  CREATININE 1.14*    Estimated Creatinine Clearance: 69.1 mL/min (A) (by C-G formula based on SCr of 1.14 mg/dL (H)).    No Known Allergies  Antimicrobials this admission: CTX 6/18 >>       >>    Dose adjustments this admission:    Microbiology results: 6/18 BCx: pending 6/18 UCx: pending    Sputum:      MRSA PCR:    Thank you for allowing pharmacy to be a part of this patient's care.  Dontravious Camille A 06/16/2017 4:52 PM

## 2017-06-16 NOTE — ED Notes (Signed)
Pt has residual weakness in her left side from stroke in 2016; states weakness has increased since Friday.

## 2017-06-17 LAB — CBC
HCT: 28.7 % — ABNORMAL LOW (ref 35.0–47.0)
Hemoglobin: 9.6 g/dL — ABNORMAL LOW (ref 12.0–16.0)
MCH: 26.9 pg (ref 26.0–34.0)
MCHC: 33.4 g/dL (ref 32.0–36.0)
MCV: 80.4 fL (ref 80.0–100.0)
Platelets: 261 10*3/uL (ref 150–440)
RBC: 3.56 MIL/uL — ABNORMAL LOW (ref 3.80–5.20)
RDW: 15.3 % — ABNORMAL HIGH (ref 11.5–14.5)
WBC: 7.1 10*3/uL (ref 3.6–11.0)

## 2017-06-17 LAB — BASIC METABOLIC PANEL
Anion gap: 7 (ref 5–15)
BUN: 21 mg/dL — ABNORMAL HIGH (ref 6–20)
CO2: 24 mmol/L (ref 22–32)
Calcium: 8.2 mg/dL — ABNORMAL LOW (ref 8.9–10.3)
Chloride: 107 mmol/L (ref 101–111)
Creatinine, Ser: 0.89 mg/dL (ref 0.44–1.00)
GFR calc Af Amer: >60 mL/min (ref >60)
GFR calc non Af Amer: >60 mL/min (ref >60)
Glucose, Bld: 163 mg/dL — ABNORMAL HIGH (ref 65–99)
Potassium: 4.1 mmol/L (ref 3.5–5.1)
Sodium: 138 mmol/L (ref 135–145)

## 2017-06-17 LAB — GLUCOSE, CAPILLARY
Glucose-Capillary: 151 mg/dL — ABNORMAL HIGH (ref 65–99)
Glucose-Capillary: 181 mg/dL — ABNORMAL HIGH (ref 65–99)
Glucose-Capillary: 185 mg/dL — ABNORMAL HIGH (ref 65–99)
Glucose-Capillary: 244 mg/dL — ABNORMAL HIGH (ref 65–99)

## 2017-06-17 MED ORDER — AMLODIPINE BESYLATE 5 MG PO TABS
5.0000 mg | ORAL_TABLET | Freq: Every day | ORAL | Status: DC
  Administered 2017-06-17 – 2017-06-19 (×3): 5 mg via ORAL
  Filled 2017-06-17 (×3): qty 1

## 2017-06-17 NOTE — Progress Notes (Signed)
[facility] NAME: Brooke Hopkins    MR#:  [id number]  DATE OF BIRTH:  1951/02/27  SUBJECTIVE:  CHIEF COMPLAINT:   Chief Complaint  Patient presents with  . Weakness   Patient was recently treated with UTI and sent to rehabilitation for 2-3 weeks where she recovered some and sent home. For last few days at home she is getting gradually weaker and now to the point that cannot get up and walk without getting help from her husband, who also cannot help much. In ER noted to have a UTI again so given to hospitalist team. REVIEW OF SYSTEMS:  CONSTITUTIONAL: No fever,Positive for fatigue or weakness.  EYES: No blurred or double vision.  EARS, NOSE, AND THROAT: No tinnitus or ear pain.  RESPIRATORY: No cough, shortness of breath, wheezing or hemoptysis.  CARDIOVASCULAR: No chest pain, orthopnea, edema.  GASTROINTESTINAL: No nausea, vomiting, diarrhea or abdominal pain.  GENITOURINARY: No dysuria, hematuria.  ENDOCRINE: No polyuria, nocturia,  HEMATOLOGY: No anemia, easy bruising or bleeding SKIN: No rash or lesion. MUSCULOSKELETAL: No joint pain or arthritis.   NEUROLOGIC: No tingling, numbness, weakness.  PSYCHIATRY: No anxiety or depression.   ROS  DRUG ALLERGIES:  No Known Allergies  VITALS:  Blood pressure (!) 194/57, pulse 79, temperature 98.4 F (36.9 C), temperature source Oral, resp. rate 19, height 5\' 7"  (1.702 m), weight 127.4 kg (280 lb 14.4 oz), SpO2 98 %.  PHYSICAL EXAMINATION:  GENERAL:  66 y.o.-year-old patient lying in the bed with no acute distress.  EYES: Pupils equal, round, reactive to light and accommodation. No scleral icterus. Extraocular muscles intact.  HEENT: Head atraumatic, normocephalic. Oropharynx and nasopharynx clear.  NECK:  Supple, no jugular venous distention. No thyroid enlargement, no tenderness.  LUNGS: Normal breath sounds bilaterally, no wheezing, rales,rhonchi or crepitation. No use of  accessory muscles of respiration.  CARDIOVASCULAR: S1, S2 normal. No murmurs, rubs, or gallops.  ABDOMEN: Soft, nontender, nondistended. Bowel sounds present. No organomegaly or mass.  EXTREMITIES: No pedal edema, cyanosis, or clubbing.  NEUROLOGIC: Cranial nerves II through XII are intact. Muscle strength 3-4/5 in all extremities. Sensation intact. Gait not checked.  PSYCHIATRIC: The patient is alert and oriented x 3.  SKIN: No obvious rash, lesion, or ulcer.   Physical Exam LABORATORY PANEL:   CBC  Recent Labs Lab 06/17/17 0626  WBC 7.1  HGB 9.6*  HCT 28.7*  PLT 261   ------------------------------------------------------------------------------------------------------------------  Chemistries   Recent Labs Lab 06/16/17 1321 06/17/17 0626  NA 138 138  K 4.4 4.1  CL 106 107  CO2 27 24  GLUCOSE 164* 163*  BUN 28* 21*  CREATININE 1.14* 0.89  CALCIUM 8.6* 8.2*  AST 15  --   ALT 13*  --   ALKPHOS 68  --   BILITOT 0.5  --    ------------------------------------------------------------------------------------------------------------------  Cardiac Enzymes  Recent Labs Lab 06/16/17 1321  TROPONINI <0.03   ------------------------------------------------------------------------------------------------------------------  RADIOLOGY:  Ct Head Wo Contrast  Result Date: 06/16/2017 CLINICAL DATA:  Stroke in 2016 and residual weakness on left side. Increased weakness since Friday. EXAM: CT HEAD WITHOUT CONTRAST TECHNIQUE: Contiguous axial images were obtained from the base of the skull through the vertex without intravenous contrast. COMPARISON:  02/18/2017 FINDINGS: Brain: No evidence for acute hemorrhage, mass lesion, midline shift, hydrocephalus or large infarct. Subtle low-density in the white matter is suggestive for chronic changes. Old infarct involving the left basal ganglia, left external capsule and possibly part  of the left caudate. Subtle low-density in the right  thalamus compatible with old infarct. Vascular: No hyperdense vessel or unexpected calcification. Skull: Normal. Negative for fracture or focal lesion. Sinuses/Orbits: No acute finding. Other: None. IMPRESSION: No acute intracranial abnormality. Evidence for old ischemic changes or insults. Electronically Signed   By: Markus Daft M.D.   On: 06/16/2017 14:01    ASSESSMENT AND PLAN:   Active Problems:   UTI (urinary tract infection)   Hypertensive urgency  66 year old female patient with history of diabetes mellitus, chronic kidney disease, hypertension presented to the emergency room with elevated blood pressure, dysuria and weakness.  1. Hypertensive urgency   Restarted oral medications as home with clonidine, hydralazine.  added amlodipine for better control, keep on hydralazine IV as needed basis.  2. Dehydration- improved with IV fluid.  3. Urinary tract infection- urine culture sent, continue ceftriaxone for now.  4. Diabetes mellitus- keep on sliding scale coverage.  5. Chronic kidney disease stage 3- stable , monitor.  PT eval.   All the records are reviewed and case discussed with Care Management/Social Workerr. Management plans discussed with the patient, family and they are in agreement.  CODE STATUS: full.  TOTAL TIME TAKING CARE OF THIS PATIENT: 35 minutes.     POSSIBLE D/C IN *1-2 DAYS, DEPENDING ON CLINICAL CONDITION.   Vaughan Basta M.D on 06/17/2017   Between 7am to 6pm - Pager - [phone]  After 6pm go to [email or web address] - password [email or web address] [facility]  Office  [phone]  CC: Primary care physician; Leonel Ramsay, MD  Note: This dictation was prepared with Dragon dictation along with smaller phrase technology. Any transcriptional errors that result from this process are unintentional.

## 2017-06-17 NOTE — Progress Notes (Signed)
Patient presently resting in the bed, remains alert and oriented, denies any pain at this time, family at bedside patient waiting for pt eval at this time

## 2017-06-17 NOTE — Care Management (Addendum)
Admitted to [facility] with diagnosis of urinary tract infection. Now under observation status. Lives with husband, Simona Huh, [phone]).  in the home. Followed by Dr. Ola Spurr as primary care physician. Discharged from the emergency room to [facility] 04/30/17. Tony 2016. PICC line placed 11/11/16,  Followed by [facility] at present. Cane, rolling walker, and wheelchair in the home. Takes care of all basic activities of daily living herself.  Family will transport. Shelbie Ammons RN MSN CCM Care Management [phone]

## 2017-06-17 NOTE — Progress Notes (Signed)
Patient vomited once, zofran given with relief , pain med administer for back pain ,

## 2017-06-18 LAB — URINE CULTURE: Culture: >=100000 — AB

## 2017-06-18 LAB — GLUCOSE, CAPILLARY
Glucose-Capillary: 163 mg/dL — ABNORMAL HIGH (ref 65–99)
Glucose-Capillary: 200 mg/dL — ABNORMAL HIGH (ref 65–99)
Glucose-Capillary: 254 mg/dL — ABNORMAL HIGH (ref 65–99)
Glucose-Capillary: 163 mg/dL — ABNORMAL HIGH (ref 65–99)

## 2017-06-18 MED ORDER — SODIUM CHLORIDE 0.9% FLUSH
3.0000 mL | Freq: Two times a day (BID) | INTRAVENOUS | Status: DC
  Administered 2017-06-18 – 2017-06-20 (×3): 3 mL via INTRAVENOUS

## 2017-06-18 MED ORDER — CIPROFLOXACIN HCL 500 MG PO TABS
500.0000 mg | ORAL_TABLET | Freq: Two times a day (BID) | ORAL | Status: DC
  Administered 2017-06-18 – 2017-06-19 (×3): 500 mg via ORAL
  Filled 2017-06-18 (×4): qty 1

## 2017-06-18 NOTE — Evaluation (Signed)
Physical Therapy Evaluation Patient Details Name: Brooke Hopkins MRN: [id number] DOB: Jun 09, 1951 Today's Date: 06/18/2017   History of Present Illness  presented to ER secondary to generalized weakness status post fall, dysuria and foul urine; admitted under observation with hypertensive urgency and UTI.  Clinical Impression  Upon evaluation, patient alert and oriented; follows all commands and demonstrates fair/good insight and safety awareness.  R UE/LE grossly WFL; L UE/LE generally weak (3-/5) due to history of previous CVA (approx 2 years prior).  Does endorse worsening weakness in L hemi-body with this acute illness/episode.  Patient currently requiring extensive physical assist for all mobility efforts: max assist +2 for sup/sit, sit/stand.  Poor/absent standing balance with marked inability to achieve full stance and postural extension.  Does tend to list to L in both supported sitting and unsupported sitting/standing with fatigue.  Unsafe/unable to attempt OOB at this time; will continue to progress mobility as able.  May benefit from education in lateral/scoot pivot transfers in subsequent sessions to promote optimal safety/indep with functional transfers. Given extensive assist required for all activities, patient unsafe for return home at this time.  Very high risk for falls/injury without appropriate levels of assist (max +2). Would benefit from skilled PT to address above deficits and promote optimal return to PLOF; recommend transition to STR upon discharge from acute hospitalization.     Follow Up Recommendations SNF    Equipment Recommendations       Recommendations for Other Services       Precautions / Restrictions Precautions Precautions: Fall Restrictions Weight Bearing Restrictions: No      Mobility  Bed Mobility Overal bed mobility: Needs Assistance Bed Mobility: Supine to Sit;Sit to Supine     Supine to sit: Mod assist;+2 for physical assistance Sit to  supine: Mod assist;+2 for physical assistance   General bed mobility comments: asssist for LE management and truncal elevation.  Significant difficulty with functional use of L hemi-body due to weakness  Transfers Overall transfer level: Needs assistance Equipment used: Rolling walker (2 wheeled) Transfers: Sit to/from Stand Sit to Stand: Max assist;+2 physical assistance         General transfer comment: unable to achieve full, erect stance despite max assist +2 and elevated bed surface due to profound LE weakness; poor/absent standing balance.  Very high fall risk.  Ambulation/Gait             General Gait Details: unsafe/unable due to LE weakness and poor standing tolerance/balance  Stairs            Wheelchair Mobility    Modified Rankin (Stroke Patients Only)       Balance Overall balance assessment: Needs assistance Sitting-balance support: No upper extremity supported;Feet supported Sitting balance-Leahy Scale: Good     Standing balance support: Bilateral upper extremity supported Standing balance-Leahy Scale: Zero                               Pertinent Vitals/Pain Pain Assessment: No/denies pain    Home Living Family/patient expects to be discharged to:: Private residence Living Arrangements: Spouse/significant other Available Help at Discharge: Family;Available 24 hours/day Type of Home: House Home Access: Ramped entrance     Home Layout: One level Home Equipment: Walker - 2 wheels;Wheelchair - manual;Shower seat      Prior Function Level of Independence: Independent with assistive device(s)         Comments: Patient WC level as primary mobility,  performing SPT without assist device (holding armrests) for transfers to/from.  Husband works outside of home during daytime hours; patient typically able to attend to ADLs and functional mobility without assist from [facility] level     Hand Dominance        Extremity/Trunk  Assessment   Upper Extremity Assessment Upper Extremity Assessment:  (L UE grossly 3-/5 (previous CVA) with associated sensory deficits.  Generally edematous (educated in positioning/elevation)Does feel slightly weaker than 'norm'.  R UE grossly WFL)    Lower Extremity Assessment Lower Extremity Assessment:  (L LE grossly 3-/5 throughout (history of CVA) with associated sensory deficits.  Does feel slightly weaker than 'norm'.  R LE grossly WFL.)    Cervical / Trunk Assessment Cervical / Trunk Assessment:  (tends to list to L with fatigue)  Communication   Communication: No difficulties (mild slurring, L facial droop)  Cognition Arousal/Alertness: Awake/alert Behavior During Therapy: WFL for tasks assessed/performed Overall Cognitive Status: Within Functional Limits for tasks assessed                                        General Comments      Exercises Other Exercises Other Exercises: Multiple sit/stand efforts with RW (x4), max assist +2.  Difficulty with bilat knee flexion due to knee pain, requiring increased anterior weight shift to complete lift off. Unable to achieve full erect stance due to hip/knee weakness. Other Exercises: Lateral scooting edge of bed, close sup; relies heavily on UE support to copmlete (limited active use of LEs). Unable to fully unweight buttocks from seating surface, but does generate some degree of lateral movement   Assessment/Plan    PT Assessment Patient needs continued PT services  PT Problem List Decreased activity tolerance;Decreased range of motion;Decreased strength;Decreased balance;Decreased mobility;Decreased coordination;Decreased cognition;Decreased knowledge of use of DME;Decreased safety awareness;Decreased knowledge of precautions;Cardiopulmonary status limiting activity;Obesity       PT Treatment Interventions DME instruction;Functional mobility training;Therapeutic activities;Therapeutic exercise;Balance  training;Patient/family education    PT Goals (Current goals can be found in the Care Plan section)  Acute Rehab PT Goals Patient Stated Goal: to get my strength back and be safe at home PT Goal Formulation: With patient/family Time For Goal Achievement: 07/02/17 Potential to Achieve Goals: Fair    Frequency Min 2X/week   Barriers to discharge Decreased caregiver support      Co-evaluation               AM-PAC PT "6 Clicks" Daily Activity  Outcome Measure Difficulty turning over in bed (including adjusting bedclothes, sheets and blankets)?: Total Difficulty moving from lying on back to sitting on the side of the bed? : Total Difficulty sitting down on and standing up from a chair with arms (e.g., wheelchair, bedside commode, etc,.)?: Total Help needed moving to and from a bed to chair (including a wheelchair)?: Total Help needed walking in hospital room?: Total Help needed climbing 3-5 steps with a railing? : Total 6 Click Score: 6    End of Session Equipment Utilized During Treatment: Gait belt Activity Tolerance: Patient tolerated treatment well Patient left: in bed;with call bell/phone within reach;with bed alarm set;with family/visitor present Nurse Communication: Mobility status PT Visit Diagnosis: Muscle weakness (generalized) (M62.81);Difficulty in walking, not elsewhere classified (R26.2);History of falling (Z91.81)    Time: 0160-1093 PT Time Calculation (min) (ACUTE ONLY): 30 min   Charges:   PT Evaluation $PT Eval  Low Complexity: 1 Procedure PT Treatments $Therapeutic Activity: 8-22 mins   PT G Codes:       Delcenia Inman H. Owens Shark, PT, DPT, NCS 06/18/17, 10:12 AM [phone]

## 2017-06-18 NOTE — Clinical Social Work Note (Addendum)
CSW presented bed offers to patient and she chose [facility].  CSW contacted [facility] and they can accept patient tomorrow if she is medically ready for discharge and orders have been received.  Jones Broom. [place], MSW, [place]  06/18/2017 12:53 PM

## 2017-06-18 NOTE — Clinical Social Work Note (Signed)
Clinical Social Work Assessment  Patient Details  Name: Brooke Hopkins MRN: [id number] Date of Birth: 1951/05/19  Date of referral:  06/18/17               Reason for consult:  Facility Placement                Permission sought to share information with:  Facility Sport and exercise psychologist, Family Supports Permission granted to share information::  Yes, Verbal Permission Granted  Name::     Brooke Hopkins [id number]  [phone]   Agency::  SNF admissions  Relationship::     Contact Information:     Housing/Transportation Living arrangements for the past 2 months:  Single Family Home Source of Information:  Patient, Spouse Patient Interpreter Needed:  None Criminal Activity/Legal Involvement Pertinent to Current Situation/Hospitalization:  No - Comment as needed Significant Relationships:  Adult Children, Spouse Lives with:  Spouse Do you feel safe going back to the place where you live?  No Need for family participation in patient care:  No (Coment)  Care giving concerns: Patient feels like she needs SNF again for short term rehab before returning back home.   Social Worker assessment / plan:  Patient is a 66 year old female who is alert and oriented x4 and lives with her husband.  Patient stated she was just recently discharged from [facility], and would like to return back to the SNF.  Patient said she has been receiving services from [facility] for home health, and would prefer to use them again once she is ready for discharge from SNF.  Patient was informed about how insurance will pay for her stay and she is familiar with what to expect at SNF.  Patient did not express any other questions or concerns.  CSW was given permission to begin bed search in [place].    Employment status:  Retired Advertising copywriter, [employer] PT Recommendations:  [place] / Referral to community resources:  [place]  Patient/Family's Response to care:  Patient and family are in agreement to going to SNF for short term rehab.  Patient/Family's Understanding of and Emotional Response to Diagnosis, Current Treatment, and Prognosis:  Patient expressed that she is disappointed she has to go to SNF again, but is okay with it.  Patient is hopeful she will not have to be at SNF very long.  Emotional Assessment Appearance:  Appears stated age Attitude/Demeanor/Rapport:    Affect (typically observed):  Pleasant, Calm, Stable Orientation:  Oriented to Self, Oriented to Place, Oriented to  Time, Oriented to Situation Alcohol / Substance use:  Not Applicable Psych involvement (Current and /or in the community):  No (Comment)  Discharge Needs  Concerns to be addressed:  Lack of Support Readmission within the last 30 days:  No Current discharge risk:  Lack of support system Barriers to Discharge:  Ship broker, Continued Medical Work up   [name] 06/18/2017, 12:42 PM

## 2017-06-18 NOTE — Progress Notes (Signed)
Patient presently resting in the bed, remains alert and oriented, family at bedside, Pt eval completed, rounded with MD at bedside, family and patient updated about POC.

## 2017-06-18 NOTE — Evaluation (Signed)
Occupational Therapy Evaluation Patient Details Name: Brooke Hopkins MRN: [id number] DOB: 04-16-1951 Today's Date: 06/18/2017    History of Present Illness presented to ER secondary to generalized weakness status post fall, dysuria and foul urine; admitted under observation with hypertensive urgency and UTI.   Clinical Impression   Pt seen for OT evaluation this date. Pt was generally able to perform self care tasks independently from wheelchair level prior to this hospitalization. Spouse would assist for shower transfers and compression stocking management. Pt home alone during the day, as spouse works. Pt presents with significant deficits in all aspects of functional mobility and ability to perform self care tasks, requiring extensive assistance due to decreased strength and activity tolerance. Pt unsafe/unable to attempt OOB at this time.  Pt will benefit from skilled OT services to address noted impairments and functional deficits including education/training in AE for self care, energy conservation strategies, falls prevention education, and functional transfer training in order to maximize return to PLOF and minimize risk of future falls/injury/rehospitalization/transition to higher level of care. Pt is a good candidate for STR following hospitalization.    Follow Up Recommendations  SNF    Equipment Recommendations  Other (comment) (consider Stedy lift pending pt's progress with rehab for use at home)    Recommendations for Other Services       Precautions / Restrictions Precautions Precautions: Fall Restrictions Weight Bearing Restrictions: No      Mobility Bed Mobility Overal bed mobility: Needs Assistance Bed Mobility: Supine to Sit;Sit to Supine     Supine to sit: Mod assist;+2 for physical assistance Sit to supine: Mod assist;+2 for physical assistance   General bed mobility comments: asssist for LE management and truncal elevation.  Significant difficulty with  functional use of L hemi-body due to weakness  Transfers Overall transfer level: Needs assistance         General transfer comment: deferred due to safety/fatigue after physical therapy evaluation; please see PT evaluation for additional detail    Balance Overall balance assessment: Needs assistance Sitting-balance support: No upper extremity supported;Feet supported Sitting balance-Leahy Scale: Good                                 ADL either performed or assessed with clinical judgement   ADL Overall ADL's : Needs assistance/impaired Eating/Feeding: Set up;Bed level Eating/Feeding Details (indicate cue type and reason): HOB elevated Grooming: Set up;Bed level Grooming Details (indicate cue type and reason): HOB elevated Upper Body Bathing: Minimal assistance;Bed level   Lower Body Bathing: Maximal assistance;Bed level;Moderate assistance   Upper Body Dressing : Set up;Bed level;Minimal assistance   Lower Body Dressing: Maximal assistance;Moderate assistance;Bed level     Toilet Transfer Details (indicate cue type and reason): unsafe to attempt         Functional mobility during ADLs:  (unsafe to attempt) General ADL Comments: pt generally mod-max assist for LB ADL     Vision Baseline Vision/History: Wears glasses Wears Glasses: Reading only Patient Visual Report: No change from baseline Vision Assessment?: No apparent visual deficits     Perception     Praxis      Pertinent Vitals/Pain Pain Assessment: No/denies pain     Hand Dominance Right   Extremity/Trunk Assessment Upper Extremity Assessment Upper Extremity Assessment:  (LUE grossly 3-/5 with sensory deficits (previous CVA), AROM 0-90, AAROM WFL; generally edematous; pt endorses feeling weaker than baseline; RUE WFL)   Lower Extremity  Assessment Lower Extremity Assessment: Defer to PT evaluation (LLE grossly 3-/5 with sensory deficits (previous CVA), pt endorses feeling weaker than  baseline; RLE [facility])   Cervical / Trunk Assessment Cervical / Trunk Assessment:  (tends to list to L side)   Communication Communication Communication: No difficulties (mild slurred speech, mild L facial droop)   Cognition Arousal/Alertness: Awake/alert Behavior During Therapy: WFL for tasks assessed/performed Overall Cognitive Status: Within Functional Limits for tasks assessed                                     General Comments  external catheter in place    Exercises Other Exercises Other Exercises: pt/spouse educated in incontinence mgt strategies including review of toileting schedule, products, using BSC beside bed overnight to maximize safety, and using a toileting aide to assist with toileting hygiene and minimize risk of UTIs Other Exercises: pt/spouse educated in lift equipment Denna Haggard lift) for functional transfers as a possible option based on pt's strength   Shoulder Instructions      Home Living Family/patient expects to be discharged to:: Private residence Living Arrangements: Spouse/significant other Available Help at Discharge: Family;Available 24 hours/day Type of Home: House Home Access: Ramped entrance     Home Layout: One level     Bathroom Shower/Tub: Occupational psychologist: Handicapped height (BSC over toilet) Bathroom Accessibility: Yes How Accessible: Accessible via wheelchair;Accessible via walker Home Equipment: [place] - 2 wheels;Wheelchair - manual;Shower seat;Hand held shower head;Bedside commode          Prior Functioning/Environment Level of Independence: Needs assistance  Gait / Transfers Assistance Needed: Patient WC level as primary mobility, performing SPT without assist device (holding armrests) for transfers to/from.   ADL's / Homemaking Assistance Needed: patient typically able to attend to ADLs and functional mobility without assist from [facility] level; spouse does provide min guard for shower transfers and  assists with compression stockings; pt manages incontinence with products and medication, has tried toileting schedule which was not effective in the past Communication / Swallowing Assistance Needed: no difficulties at baseline Comments: Husband works outside of home during daytime hours; pt endorses 3 falls in past 12 months stating, "my legs just gave out on me"        OT Problem List: Decreased strength;Decreased activity tolerance;Impaired balance (sitting and/or standing);Decreased knowledge of use of DME or AE      OT Treatment/Interventions: Self-care/ADL training;Therapeutic exercise;Therapeutic activities;Energy conservation;DME and/or AE instruction;Patient/family education    OT Goals(Current goals can be found in the care plan section) Acute Rehab OT Goals Patient Stated Goal: to get my strength back and be safe at home OT Goal Formulation: With patient/family Time For Goal Achievement: 07/02/17 Potential to Achieve Goals: Good  OT Frequency: Min 2X/week   Barriers to D/C:            Co-evaluation              AM-PAC PT "6 Clicks" Daily Activity     Outcome Measure Help from another person eating meals?: None Help from another person taking care of personal grooming?: None Help from another person toileting, which includes using toliet, bedpan, or urinal?: A Lot Help from another person bathing (including washing, rinsing, drying)?: A Lot Help from another person to put on and taking off regular upper body clothing?: A Little Help from another person to put on and taking off regular lower  body clothing?: A Lot 6 Click Score: 17   End of Session    Activity Tolerance: Patient tolerated treatment well Patient left: in bed;with call bell/phone within reach;with bed alarm set;with family/visitor present  OT Visit Diagnosis: Muscle weakness (generalized) (M62.81);History of falling (Z91.81);Other abnormalities of gait and mobility (R26.89)                Time:  0981-1914 OT Time Calculation (min): 28 min Charges:  OT General Charges $OT Visit: 1 Procedure OT Evaluation $OT Eval Low Complexity: 1 Procedure G-Codes: OT G-codes **NOT FOR INPATIENT CLASS** Functional Assessment Tool Used: Clinical judgement;AM-PAC 6 Clicks Daily Activity Functional Limitation: Self care Self Care Current Status (N8295): At least 40 percent but less than 60 percent impaired, limited or restricted Self Care Goal Status (A2130): At least 40 percent but less than 60 percent impaired, limited or restricted   Jeni Salles, MPH, MS, OTR/L ascom [phone] 06/18/17, 1:30 PM

## 2017-06-18 NOTE — Progress Notes (Signed)
[place] [facility] [place] NAME: Brooke Hopkins    MR#:  [id number]  DATE OF BIRTH:  Oct 26, 1951  SUBJECTIVE:  CHIEF COMPLAINT:   Chief Complaint  Patient presents with  . Weakness   Admitted for weakness and UTI.  Has Chronic left-sided weakness from prior stroke. Also left knee pain from recent fall.  Work with physical therapy earlier today. Could only stand at the site of the bed. REVIEW OF SYSTEMS:  CONSTITUTIONAL: No fever,Positive for fatigue or weakness.  EYES: No blurred or double vision.  EARS, NOSE, AND THROAT: No tinnitus or ear pain.  RESPIRATORY: No cough, shortness of breath, wheezing or hemoptysis.  CARDIOVASCULAR: No chest pain, orthopnea, edema.  GASTROINTESTINAL: No nausea, vomiting, diarrhea or abdominal pain.  GENITOURINARY: No dysuria, hematuria.  ENDOCRINE: No polyuria, nocturia,  HEMATOLOGY: No anemia, easy bruising or bleeding SKIN: No rash or lesion. MUSCULOSKELETAL: left knee pain NEUROLOGIC: No tingling, numbness, weakness.  PSYCHIATRY: No anxiety or depression.   ROS  DRUG ALLERGIES:  No Known Allergies  VITALS:  Blood pressure (!) 157/51, pulse 82, temperature 98.5 F (36.9 C), temperature source Oral, resp. rate 14, height 5\' 7"  (1.702 m), weight 127.4 kg (280 lb 14.4 oz), SpO2 95 %.  PHYSICAL EXAMINATION:  GENERAL:  66 y.o.-year-old patient lying in the bed with no acute distress.  EYES: Pupils equal, round, reactive to light and accommodation. No scleral icterus. Extraocular muscles intact.  HEENT: Head atraumatic, normocephalic. Oropharynx and nasopharynx clear.  NECK:  Supple, no jugular venous distention. No thyroid enlargement, no tenderness.  LUNGS: Normal breath sounds bilaterally, no wheezing, rales,rhonchi or crepitation. No use of accessory muscles of respiration.  CARDIOVASCULAR: S1, S2 normal. No murmurs, rubs, or gallops.  ABDOMEN: Soft, nontender, nondistended. Bowel sounds present. No  organomegaly or mass.  EXTREMITIES: No pedal edema, cyanosis, or clubbing.  NEUROLOGIC: Cranial nerves II through XII are intact. Muscle strength 5-/5 on right and 4+/5 on left. Sensation intact. Gait not checked.  PSYCHIATRIC: The patient is alert and oriented x 3.  SKIN: No obvious rash, lesion, or ulcer.   Physical Exam LABORATORY PANEL:   CBC  Recent Labs Lab 06/17/17 0626  WBC 7.1  HGB 9.6*  HCT 28.7*  PLT 261   ------------------------------------------------------------------------------------------------------------------  Chemistries   Recent Labs Lab 06/16/17 1321 06/17/17 0626  NA 138 138  K 4.4 4.1  CL 106 107  CO2 27 24  GLUCOSE 164* 163*  BUN 28* 21*  CREATININE 1.14* 0.89  CALCIUM 8.6* 8.2*  AST 15  --   ALT 13*  --   ALKPHOS 68  --   BILITOT 0.5  --    ------------------------------------------------------------------------------------------------------------------  Cardiac Enzymes  Recent Labs Lab 06/16/17 1321  TROPONINI <0.03   ------------------------------------------------------------------------------------------------------------------  RADIOLOGY:  Ct Head Wo Contrast  Result Date: 06/16/2017 CLINICAL DATA:  Stroke in 2016 and residual weakness on left side. Increased weakness since Friday. EXAM: CT HEAD WITHOUT CONTRAST TECHNIQUE: Contiguous axial images were obtained from the base of the skull through the vertex without intravenous contrast. COMPARISON:  02/18/2017 FINDINGS: Brain: No evidence for acute hemorrhage, mass lesion, midline shift, hydrocephalus or large infarct. Subtle low-density in the white matter is suggestive for chronic changes. Old infarct involving the left basal ganglia, left external capsule and possibly part of the left caudate. Subtle low-density in the right thalamus compatible with old infarct. Vascular: No hyperdense vessel or unexpected calcification. Skull: Normal. Negative for fracture or focal lesion.  Sinuses/Orbits:  No acute finding. Other: None. IMPRESSION: No acute intracranial abnormality. Evidence for old ischemic changes or insults. Electronically Signed   By: Markus Daft M.D.   On: 06/16/2017 14:01    ASSESSMENT AND PLAN:   Active Problems:   UTI (urinary tract infection)   Hypertensive urgency  66 year old female patient with history of diabetes mellitus, chronic kidney disease, hypertension presented to the emergency room with elevated blood pressure, dysuria and weakness.  1. Hypertensive urgency Clonidine and hydralazine from home restarted. Amlodipine added. Blood pressure improved. IV when necessary blood pressure pressure medications available.  2. Dehydration- improved with IV fluid. Stop IV fluids today  3.  klebsiella urinary tract infection. Stop ceftriaxone. Start oral ciprofloxacin.  4. Diabetes mellitus-  sliding scale coverage.  5. Chronic kidney disease stage 3  Needs skilled nursing facility after discharge.   All the records are reviewed and case discussed with Care Management/Social Worker Management plans discussed with the patient, family and they are in agreement.  CODE STATUS: full.  TOTAL TIME TAKING CARE OF THIS PATIENT:30 minutes.   POSSIBLE D/C IN 1-2 DAYS, DEPENDING ON CLINICAL CONDITION.  Hillary Bow R M.D on 06/18/2017   Between 7am to 6pm - Pager - [phone]  After 6pm go to [email or web address] - password [email or web address] [facility]  Office  [phone]  CC: Primary care physician; Leonel Ramsay, MD  Note: This dictation was prepared with Dragon dictation along with smaller phrase technology. Any transcriptional errors that result from this process are unintentional.

## 2017-06-18 NOTE — Clinical Social Work Placement (Addendum)
   CLINICAL SOCIAL WORK PLACEMENT  NOTE  Date:  06/18/2017  Patient Details  Name: Brooke Hopkins MRN: [id number] Date of Birth: Jul 27, 1951  Clinical Social Work is seeking post-discharge placement for this patient at the [place] level of care (*CSW will initial, date and re-position this form in  chart as items are completed):  Yes   Patient/family provided with [place] Work Department's list of facilities offering this level of care within the geographic area requested by the patient (or if unable, by the patient's family).  Yes   Patient/family informed of their freedom to choose among providers that offer the needed level of care, that participate in Medicare, Medicaid or managed care program needed by the patient, have an available bed and are willing to accept the patient.  Yes   Patient/family informed of [facility]'s ownership interest in [facility] and [facility], as well as of the fact that they are under no obligation to receive care at these facilities.  PASRR submitted to EDS on 06/18/17     PASRR number received on       Existing PASRR number confirmed on 06/18/17     FL2 transmitted to all facilities in geographic area requested by pt/family on 06/18/17     FL2 transmitted to all facilities within larger geographic area on       Patient informed that his/her managed care company has contracts with or will negotiate with certain facilities, including the following:        Yes   Patient/family informed of bed offers received.  Patient chooses bed at Other - please specify in the comment section below: ([facility])     Physician recommends and patient chooses bed at      Patient to be transferred to  ([facility]) on  06-20-17 (06-20-17 updated Evette Cristal, MSW, [place]) .  Patient to be transferred to facility by  [facility] (06-20-17 updated Evette Cristal, MSW,  LCSWA)     Patient family notified on  06-20-17 of transfer. (06-20-17 updated Evette Cristal, MSW, [facility])   Name of family member notified:   Patient will notify her husband (06-20-17 updated Evette Cristal, MSW, [place])      PHYSICIAN Please sign FL2     Additional Comment:    _______________________________________________ Ross Ludwig, LCSWA 06/18/2017, 12:51 PM  (06-20-17 updated Evette Cristal, MSW, [facility])

## 2017-06-18 NOTE — NC FL2 (Signed)
[place] LEVEL OF CARE SCREENING TOOL     IDENTIFICATION  Patient Name: Brooke Hopkins Birthdate: 04/12/1951 Sex: female Admission Date (Current Location): 06/16/2017  [place] and [place] Number:  Engineering geologist and Address:  [facility], [address], [place], [place] [address]      Provider Number: [id number]  Attending Physician Name and Address:  Hillary Bow, MD  Relative Name and Phone Number:  Terrah, [place] [phone] or Eliott Nine [phone]     Current Level of Care: Hospital Recommended Level of Care: [place] Prior Approval Number:    Date Approved/Denied:   PASRR Number: [id number]  Discharge Plan: SNF    Current Diagnoses: Patient Active Problem List   Diagnosis Date Noted  . UTI (urinary tract infection) 06/16/2017  . Hypertensive urgency 06/16/2017  . Pure hypercholesterolemia 08/17/2016  . Lymphedema 08/17/2016  . Wound infection 08/16/2016  . 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 Pressure ulcer 10/17/2015  . Cellulitis 10/13/2015    Orientation RESPIRATION BLADDER Height & Weight     Self, Time, Situation, Place  Normal Continent Weight: 280 lb 14.4 oz (127.4 kg) Height:  5\' 7"  (170.2 cm)  BEHAVIORAL SYMPTOMS/MOOD NEUROLOGICAL BOWEL NUTRITION STATUS      Continent Diet (Low sodium diet)  AMBULATORY STATUS COMMUNICATION OF NEEDS Skin   Extensive Assist Verbally Normal                       Personal Care Assistance Level of Assistance  Bathing, Feeding, Dressing Bathing Assistance: Limited assistance Feeding assistance: Independent Dressing Assistance: Limited assistance     Functional Limitations Info  Sight, Hearing, Speech Sight Info: Adequate Hearing Info: Adequate Speech Info: Adequate    SPECIAL CARE FACTORS FREQUENCY  PT (By licensed PT), OT (By licensed OT)     PT Frequency: 5x  a  week OT Frequency: 5x a week            Contractures Contractures Info: Not present    Additional Factors Info  Insulin Sliding Scale Code Status Info: Full Code Allergies Info: NKA   Insulin Sliding Scale Info: insulin aspart (novoLOG) injection 0-15 Units 3x a day with meals       Current Medications (06/18/2017):  This is the current hospital active medication list Current Facility-Administered Medications  Medication Dose Route Frequency Provider Last Rate Last Dose  . acetaminophen (TYLENOL) tablet 650 mg  650 mg Oral Q6H PRN Saundra Shelling, MD       Or  . acetaminophen (TYLENOL) suppository 650 mg  650 mg Rectal Q6H PRN Pyreddy, Pavan, MD      . amLODipine (NORVASC) tablet 5 mg  5 mg Oral Daily Vaughan Basta, MD   5 mg at 06/18/17 1042  . aspirin EC tablet 81 mg  81 mg Oral Daily Pyreddy, Reatha Harps, MD   81 mg at 06/18/17 1042  . cefTRIAXone (ROCEPHIN) 1 g in dextrose 5 % 50 mL IVPB  1 g Intravenous Q24H Pyreddy, Pavan, MD 120 mL/hr at 06/18/17 1045 1 g at 06/18/17 1045  . [START ON 06/21/2017] cloNIDine (CATAPRES - Dosed in mg/24 hr) patch 0.2 mg  0.2 mg Transdermal Weekly Pyreddy, Pavan, MD      . clopidogrel (PLAVIX) tablet 75 mg  75 mg Oral Daily Pyreddy, Pavan, MD   75 mg at 06/18/17 1042  . enoxaparin (LOVENOX) injection 40 mg  40 mg Subcutaneous Q12H Pyreddy, Pavan,  MD   40 mg at 06/18/17 1043  . hydrALAZINE (APRESOLINE) injection 10 mg  10 mg Intravenous Q4H PRN Saundra Shelling, MD   10 mg at 06/17/17 1354  . hydrALAZINE (APRESOLINE) tablet 25 mg  25 mg Oral Q8H Pyreddy, Reatha Harps, MD   25 mg at 06/18/17 5732  . insulin aspart (novoLOG) injection 0-15 Units  0-15 Units Subcutaneous TID WC Saundra Shelling, MD   3 Units at 06/18/17 0815  . insulin aspart (novoLOG) injection 0-5 Units  0-5 Units Subcutaneous QHS Saundra Shelling, MD   2 Units at 06/17/17 2106  . ondansetron (ZOFRAN) tablet 4 mg  4 mg Oral Q6H PRN Saundra Shelling, MD       Or  . ondansetron (ZOFRAN) injection  4 mg  4 mg Intravenous Q6H PRN Saundra Shelling, MD   4 mg at 06/17/17 1550  . oxybutynin (DITROPAN) tablet 5 mg  5 mg Oral BID Saundra Shelling, MD   5 mg at 06/18/17 1043  . oxyCODONE (Oxy IR/ROXICODONE) immediate release tablet 5 mg  5 mg Oral Q8H PRN Saundra Shelling, MD   5 mg at 06/17/17 1619  . senna-docusate (Senokot-S) tablet 1 tablet  1 tablet Oral QHS PRN Saundra Shelling, MD         Discharge Medications: Please see discharge summary for a list of discharge medications.  Relevant Imaging Results:  Relevant Lab Results:   Additional Information SSN [id number]  Ross Ludwig, [place]

## 2017-06-19 ENCOUNTER — Inpatient Hospital Stay (HOSPITAL_BASED_OUTPATIENT_CLINIC_OR_DEPARTMENT_OTHER)
Admit: 2017-06-19 | Discharge: 2017-06-19 | Disposition: A | Discharge status: Home / Self Care | Payer: 59 | Attending: Internal Medicine | Admitting: Internal Medicine

## 2017-06-19 ENCOUNTER — Inpatient Hospital Stay: Payer: 59

## 2017-06-19 DIAGNOSIS — I639 Cerebral infarction, unspecified: Secondary | ICD-10-CM

## 2017-06-19 DIAGNOSIS — I6350 Cerebral infarction due to unspecified occlusion or stenosis of unspecified cerebral artery: Secondary | ICD-10-CM

## 2017-06-19 LAB — GLUCOSE, CAPILLARY
Glucose-Capillary: 179 mg/dL — ABNORMAL HIGH (ref 65–99)
Glucose-Capillary: 222 mg/dL — ABNORMAL HIGH (ref 65–99)
Glucose-Capillary: 229 mg/dL — ABNORMAL HIGH (ref 65–99)
Glucose-Capillary: 260 mg/dL — ABNORMAL HIGH (ref 65–99)

## 2017-06-19 MED ORDER — CEPHALEXIN 500 MG PO CAPS
500.0000 mg | ORAL_CAPSULE | Freq: Two times a day (BID) | ORAL | Status: DC
  Administered 2017-06-19 – 2017-06-20 (×3): 500 mg via ORAL
  Filled 2017-06-19 (×3): qty 1

## 2017-06-19 NOTE — Clinical Social Work Note (Signed)
CSW was informed that patient is not ready for discharge today per MD.  CSW updated [place].  CSW continuing to follow patient's progress throughout discharge planning.  Jones Broom. [place], MSW, [place]  06/19/2017 2:53 PM

## 2017-06-19 NOTE — Progress Notes (Signed)
MRI positive.  Dr. Margaretmary Eddy paged.  She will look at scan and put in new orders.

## 2017-06-19 NOTE — Progress Notes (Signed)
Bedside swallow evalution done.  Patient passed.

## 2017-06-19 NOTE — Progress Notes (Signed)
*  PRELIMINARY RESULTS* Echocardiogram 2D Echocardiogram has been performed.  Sherrie Sport 06/19/2017, 3:42 PM

## 2017-06-19 NOTE — Progress Notes (Signed)
Inpatient Diabetes Program Recommendations  AACE/ADA: New Consensus Statement on Inpatient Glycemic Control (2015)  Target Ranges:  Prepandial:   less than 140 mg/dL      Peak postprandial:   less than 180 mg/dL (1-2 hours)      Critically ill patients:  140 - 180 mg/dL   Lab Results  Component Value Date   GLUCAP 179 (H) 06/19/2017   HGBA1C 6.3 (H) 10/13/2015    Review of Glycemic ControlResults for SHANTRICE, RODENBERG (MRN [id number]) as of 06/19/2017 12:11  Ref. Range 06/18/2017 11:36 06/18/2017 16:26 06/18/2017 21:11 06/19/2017 07:47  Glucose-Capillary Latest Ref Range: 65 - 99 mg/dL 200 (H) 254 (H) 163 (H) 179 (H)    Diabetes history: Type 2 diabetes Outpatient Diabetes medications: Novolin 70/30 35 units bid Current orders for Inpatient glycemic control:  Novolog moderate tid with meals and HS  Inpatient Diabetes Program Recommendations:    Note that patient is on 70/30 insulin at home.  While in the hospital, consider adding basal insulin such as Levemir 25 units daily.    Thanks, Adah Perl, RN, BC-ADM Inpatient Diabetes Coordinator Pager [phone] (8a-5p)

## 2017-06-19 NOTE — Progress Notes (Signed)
[facility] [place] NAME: Annastyn Silvey    MR#:  [id number]  DATE OF BIRTH:  September 01, 1951  SUBJECTIVE:  CHIEF COMPLAINT:   Chief Complaint  Patient presents with  . Weakness   Admitted for weakness and UTI. Patient is reporting worsening of her chronic left-sided weakness. Her grip is weak when compared to her normal  Has Chronic left-sided weakness from prior stroke. Also left knee pain from recent fall.   REVIEW OF SYSTEMS:  CONSTITUTIONAL: No fever,Positive for fatigue or weakness.  EYES: No blurred or double vision.  EARS, NOSE, AND THROAT: No tinnitus or ear pain.  RESPIRATORY: No cough, shortness of breath, wheezing or hemoptysis.  CARDIOVASCULAR: No chest pain, orthopnea, edema.  GASTROINTESTINAL: No nausea, vomiting, diarrhea or abdominal pain.  GENITOURINARY: No dysuria, hematuria.  ENDOCRINE: No polyuria, nocturia,  HEMATOLOGY: No anemia, easy bruising or bleeding SKIN: No rash or lesion. MUSCULOSKELETAL: left knee pain NEUROLOGIC: Reporting worsening of left-sided weakness No tingling, numbness, PSYCHIATRY: No anxiety or depression.   ROS  DRUG ALLERGIES:  No Known Allergies  VITALS:  Blood pressure (!) 155/65, pulse 73, temperature 97.8 F (36.6 C), temperature source Oral, resp. rate 16, height 5\' 7"  (1.702 m), weight 127.4 kg (280 lb 14.4 oz), SpO2 97 %.  PHYSICAL EXAMINATION:  GENERAL:  66 y.o.-year-old patient lying in the bed with no acute distress.  EYES: Pupils equal, round, reactive to light and accommodation. No scleral icterus. Extraocular muscles intact.  HEENT: Head atraumatic, normocephalic. Oropharynx and nasopharynx clear.  NECK:  Supple, no jugular venous distention. No thyroid enlargement, no tenderness.  LUNGS: Normal breath sounds bilaterally, no wheezing, rales,rhonchi or crepitation. No use of accessory muscles of respiration.  CARDIOVASCULAR: S1, S2 normal. No murmurs, rubs, or gallops.   ABDOMEN: Soft, nontender, nondistended. Bowel sounds present. No organomegaly or mass.  EXTREMITIES: No pedal edema, cyanosis, or clubbing.  NEUROLOGIC: Cranial nerves II through XII are intact. Muscle strength 5-/5 on right and 3+/5 on left. Sensation intact. Gait not checked.  PSYCHIATRIC: The patient is alert and oriented x 3.  SKIN: No obvious rash, lesion, or ulcer.   Physical Exam LABORATORY PANEL:   CBC  Recent Labs Lab 06/17/17 0626  WBC 7.1  HGB 9.6*  HCT 28.7*  PLT 261   ------------------------------------------------------------------------------------------------------------------  Chemistries   Recent Labs Lab 06/16/17 1321 06/17/17 0626  NA 138 138  K 4.4 4.1  CL 106 107  CO2 27 24  GLUCOSE 164* 163*  BUN 28* 21*  CREATININE 1.14* 0.89  CALCIUM 8.6* 8.2*  AST 15  --   ALT 13*  --   ALKPHOS 68  --   BILITOT 0.5  --    ------------------------------------------------------------------------------------------------------------------  Cardiac Enzymes  Recent Labs Lab 06/16/17 1321  TROPONINI <0.03   ------------------------------------------------------------------------------------------------------------------  RADIOLOGY:  Mr Brain Wo Contrast  Result Date: 06/19/2017 CLINICAL DATA:  Left arm weakness.  Stroke EXAM: MRI HEAD WITHOUT CONTRAST TECHNIQUE: Multiplanar, multiecho pulse sequences of the brain and surrounding structures were obtained without intravenous contrast. COMPARISON:  CT head 06/16/2017 FINDINGS: Brain: Acute infarct in the right pons measuring approximately 8 x 12 mm. No other acute infarct. Moderate chronic microvascular ischemic changes in the white matter and pons and bilateral thalamus. 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 Negative for mass lesion. Ventricle size normal. No shift of the midline structures. Vascular: Normal arterial flow voids Skull and upper cervical spine: Negative  Sinuses/Orbits: Mild mucosal edema  in the mastoid sinus bilaterally and in the ethmoid sinuses bilaterally. No orbital mass. Other: None IMPRESSION: Acute infarct right pons.  Moderate chronic ischemic change. Electronically Signed   By: Franchot Gallo M.D.   On: 06/19/2017 12:47    ASSESSMENT AND PLAN:   Active Problems:   UTI (urinary tract infection)   Hypertensive urgency  66 year old female patient with history of diabetes mellitus, chronic kidney disease, hypertension presented to the emergency room with elevated blood pressure, dysuria and weakness.  # worsening left sided weakness- new acute CVA Mri brain -acute infarct rt Pons Echo -ordered Carotid Dopplers were done in February with no significant stenosis Continue aspirin, Plavix Neuro evaluation Patient is swallowing fine according to the nurse Speech therapy evaluation for slurry speech Check fasting lipid panel, TSH and A1c   . Hypertensive urgency will allow permissive hypertension with new acute CVA Hold antihypertensives  hydralazine 25 g every 8 hours and amlodipine 5 mg Continue clonidine  2. Dehydration- improved with IV fluid. Stop IV fluids today  3.  klebsiella urinary tract infection. Stop ceftriaxone. Start oral ciprofloxacin.  4. Diabetes mellitus-  sliding scale coverage.  5. Chronic kidney disease stage 3  Needs skilled nursing facility after discharge.   All the records are reviewed and case discussed with Care Management/Social Worker Management plans discussed with the patient, family and they are in agreement.  CODE STATUS: full.  TOTAL TIME TAKING CARE OF THIS PATIENT:36 minutes.   POSSIBLE D/C IN 1-2 DAYS, DEPENDING ON CLINICAL CONDITION.  Nicholes Mango M.D on 06/19/2017   Between 7am to 6pm - Pager - [phone]   After 6pm go to [email or web address] - password [email or web address] [facility]  Office  [phone]  CC: Primary care physician; Leonel Ramsay, MD  Note:  This dictation was prepared with Dragon dictation along with smaller phrase technology. Any transcriptional errors that result from this process are unintentional.

## 2017-06-19 NOTE — Consult Note (Signed)
Referring Physician: Gouru    Chief Complaint: Left sided weakness  HPI: Brooke Hopkins is an 66 y.o. female with a history of stroke and left residual deficits who reports that on Friday she was at baseline which is wheelchair bound but able to make transfers.  She found herself unable to make a transfer during the evening and fell.  Since that time she has noticed worse left sided weakness and numbness.  With no improvement in her symptoms patient presented for evaluation.  Initial NIHSS of 7.    Date last known well: Date: 06/13/2017 Time last known well: Time: 21:00 tPA Given: No: Outside treatment window  Past Medical History:  Diagnosis Date  . Chronic kidney disease   . Diabetes mellitus   . Hypertension   . Stroke [facility]     Past Surgical History:  Procedure Laterality Date  . ABDOMINAL HYSTERECTOMY    . CHOLECYSTECTOMY    . HAMMER TOE SURGERY    . KNEE ARTHROSCOPY    . LUMBAR DISC SURGERY  11/07/11  . LUMBAR WOUND DEBRIDEMENT  11/27/2011   Procedure: LUMBAR WOUND DEBRIDEMENT;  Surgeon: Eustace Moore;  Location: [facility];  Service: Neurosurgery;  Laterality: N/A;  . TONSILLECTOMY      Family History  Problem Relation Age of Onset  . CAD Mother   . Hypertension Mother   . COPD Father   . Diabetes Sister   . Hypertension Sister    Social History:  reports that she has never smoked. She has never used smokeless tobacco. She reports that she does not drink alcohol or use drugs.  Allergies: No Known Allergies  Medications:  I have reviewed the patient's current medications. Prior to Admission:  Prescriptions Prior to Admission  Medication Sig Dispense Refill Last Dose  . aspirin EC 81 MG tablet Take 81 mg by mouth daily.   06/15/2017 at 0800  . cloNIDine (CATAPRES - DOSED IN MG/24 HR) 0.2 mg/24hr patch Place 1 patch (0.2 mg total) onto the skin once a week. 4 patch 0 06/14/2017 at 0800  . clopidogrel (PLAVIX) 75 MG tablet Take 75 mg by mouth daily.   Past Month  at Unknown time  . insulin NPH-regular Human (NOVOLIN 70/30) (70-30) 100 UNIT/ML injection Inject 35 Units into the skin 2 (two) times daily. (Patient taking differently: Inject 40 Units into the skin 2 (two) times daily. ) 10 mL 11 06/15/2017 at 1800  . oxybutynin (DITROPAN) 5 MG tablet Take 5 mg by mouth 2 (two) times daily.   06/15/2017 at 2000  . atorvastatin (LIPITOR) 80 MG tablet Take 1 tablet (80 mg total) by mouth daily at 6 PM. (Patient not taking: Reported on 04/30/2017) 30 tablet 0 Not Taking at Unknown time  . cefTAZidime 1 g in dextrose 5 % 50 mL Inject 1 g into the vein every 8 (eight) hours. (Patient not taking: Reported on 04/30/2017) 42 ampule 0 Not Taking at Unknown time  . collagenase (SANTYL) ointment Apply topically daily. (Patient not taking: Reported on 04/30/2017) 30 g 0 Not Taking at Unknown time  . hydrALAZINE (APRESOLINE) 25 MG tablet Take 1 tablet (25 mg total) by mouth every 8 (eight) hours. (Patient not taking: Reported on 04/30/2017) 90 tablet 0 Not Taking at Unknown time  . nystatin cream (MYCOSTATIN) Apply topically 2 (two) times daily. (Patient not taking: Reported on 04/30/2017) 30 g 0 Not Taking at Unknown time  . oxyCODONE (ROXICODONE) 5 MG immediate release tablet Take 1 tablet (5  mg total) by mouth every 8 (eight) hours as needed. (Patient not taking: Reported on 06/16/2017) 20 tablet 0 Completed Course at Unknown time  . traMADol (ULTRAM) 50 MG tablet Take 1 tablet (50 mg total) by mouth every 6 (six) hours as needed for moderate pain. (Patient not taking: Reported on 11/11/2016) 30 tablet 0 Not Taking at Unknown time   Scheduled: . aspirin EC  81 mg Oral Daily  . cephALEXin  500 mg Oral Q12H  . [START ON 06/21/2017] cloNIDine  0.2 mg Transdermal Weekly  . clopidogrel  75 mg Oral Daily  . enoxaparin (LOVENOX) injection  40 mg Subcutaneous Q12H  . insulin aspart  0-15 Units Subcutaneous TID WC  . insulin aspart  0-5 Units Subcutaneous QHS  . oxybutynin  5 mg Oral BID  .  sodium chloride flush  3 mL Intravenous Q12H    ROS: History obtained from the patient  General ROS: negative for - chills, fatigue, fever, night sweats, weight gain or weight loss Psychological ROS: negative for - behavioral disorder, hallucinations, memory difficulties, mood swings or suicidal ideation Ophthalmic ROS: negative for - blurry vision, double vision, eye pain or loss of vision ENT ROS: negative for - epistaxis, nasal discharge, oral lesions, sore throat, tinnitus or vertigo Allergy and Immunology ROS: negative for - hives or itchy/watery eyes Hematological and Lymphatic ROS: negative for - bleeding problems, bruising or swollen lymph nodes Endocrine ROS: negative for - galactorrhea, hair pattern changes, polydipsia/polyuria or temperature intolerance Respiratory ROS: negative for - cough, hemoptysis, shortness of breath or wheezing Cardiovascular ROS: LE edema Gastrointestinal ROS: negative for - abdominal pain, diarrhea, hematemesis, nausea/vomiting or stool incontinence Genito-Urinary ROS: negative for - dysuria, hematuria, incontinence or urinary frequency/urgency Musculoskeletal ROS: neck pain Neurological ROS: as noted in HPI Dermatological ROS: negative for rash and skin lesion changes  Physical Examination: Blood pressure (!) 165/56, pulse 83, temperature 98.1 F (36.7 C), temperature source Oral, resp. rate 16, height 5\' 7"  (1.702 m), weight 127.4 kg (280 lb 14.4 oz), SpO2 99 %.  Gen: NAD HEENT-  Normocephalic, no lesions, without obvious abnormality.  Normal external eye and conjunctiva.  Normal TM's bilaterally.  Normal auditory canals and external ears. Normal external nose, mucus membranes and septum.  Normal pharynx. Cardiovascular- S1, S2 normal, pulses palpable throughout   Lungs- chest clear, no wheezing, rales, normal symmetric air entry Abdomen- soft, non-tender; bowel sounds normal; no masses,  no organomegaly Extremities- BLE edema Lymph-no adenopathy  palpable Musculoskeletal-no joint tenderness, deformity or swelling Skin-warm and dry, no hyperpigmentation, vitiligo, or suspicious lesions  Neurological Examination   Mental Status: Alert, oriented, thought content appropriate.  Speech fluent without evidence of aphasia.  Able to follow 3 step commands without difficulty. Cranial Nerves: II: Discs flat bilaterally; Visual fields grossly normal, pupils equal, round, reactive to light and accommodation III,IV, VI: ptosis not present, extra-ocular motions intact bilaterally V,VII: smile symmetric, facial light touch sensation decreased on the left VIII: hearing normal bilaterally IX,X: gag reflex present XI: bilateral shoulder shrug XII: midline tongue extension Motor: Right : Upper extremity   5/5    Left:     Upper extremity   4+/5. LUE drift  Lower extremity   5/5     Lower extremity   4+/5.  External rotation of the LLE Tone and bulk:normal tone throughout; no atrophy noted Sensory: Pinprick and light touch decreased on the left upper and lower extremities Deep Tendon Reflexes: 2+ in the upper extremities and absent in the lower  extremities Plantars: Right: upgoing   Left: left Cerebellar: normal finger-to-nose and normal heel-to-shin testing bilaterally Gait: not tested due to safety concerns    Laboratory Studies:  Basic Metabolic Panel:  Recent Labs Lab 06/16/17 1321 06/17/17 0626  NA 138 138  K 4.4 4.1  CL 106 107  CO2 27 24  GLUCOSE 164* 163*  BUN 28* 21*  CREATININE 1.14* 0.89  CALCIUM 8.6* 8.2*    Liver Function Tests:  Recent Labs Lab 06/16/17 1321  AST 15  ALT 13*  ALKPHOS 68  BILITOT 0.5  PROT 6.7  ALBUMIN 3.2*   No results for input(s): LIPASE, AMYLASE in the last 168 hours. No results for input(s): AMMONIA in the last 168 hours.  CBC:  Recent Labs Lab 06/16/17 1321 06/17/17 0626  WBC 7.6 7.1  HGB 9.9* 9.6*  HCT 29.9* 28.7*  MCV 80.5 80.4  PLT 300 261    Cardiac Enzymes:  Recent  Labs Lab 06/16/17 1321  TROPONINI <0.03    BNP: Invalid input(s): POCBNP  CBG:  Recent Labs Lab 06/18/17 2111 06/19/17 0747 06/19/17 1327 06/19/17 1638 06/19/17 2034  GLUCAP 163* 179* 222* 229* 260*    Microbiology: Results for orders placed or performed during the hospital encounter of 06/16/17  Urine culture     Status: Abnormal   Collection Time: 06/16/17  2:29 PM  Result Value Ref Range Status   Specimen Description URINE, CATHETERIZED  Final   Special Requests NONE  Final   Culture >=100,000 COLONIES/mL KLEBSIELLA PNEUMONIAE (A)  Final   Report Status 06/18/2017 FINAL  Final   Organism ID, Bacteria KLEBSIELLA PNEUMONIAE (A)  Final      Susceptibility   Klebsiella pneumoniae - MIC*    AMPICILLIN RESISTANT Resistant     CEFAZOLIN <=4 SENSITIVE Sensitive     CEFTRIAXONE <=1 SENSITIVE Sensitive     CIPROFLOXACIN <=0.25 SENSITIVE Sensitive     GENTAMICIN <=1 SENSITIVE Sensitive     IMIPENEM <=0.25 SENSITIVE Sensitive     NITROFURANTOIN 32 SENSITIVE Sensitive     TRIMETH/SULFA <=20 SENSITIVE Sensitive     AMPICILLIN/SULBACTAM <=2 SENSITIVE Sensitive     PIP/TAZO <=4 SENSITIVE Sensitive     Extended ESBL NEGATIVE Sensitive     * >=100,000 COLONIES/mL KLEBSIELLA PNEUMONIAE  Blood culture (routine x 2)     Status: None (Preliminary result)   Collection Time: 06/16/17  2:42 PM  Result Value Ref Range Status   Specimen Description BLOOD RIGHT AC  Final   Special Requests   Final    BOTTLES DRAWN AEROBIC AND ANAEROBIC Blood Culture adequate volume   Culture NO GROWTH 3 DAYS  Final   Report Status PENDING  Incomplete  Blood culture (routine x 2)     Status: None (Preliminary result)   Collection Time: 06/16/17  2:42 PM  Result Value Ref Range Status   Specimen Description BLOOD LEFT FA  Final   Special Requests   Final    BOTTLES DRAWN AEROBIC AND ANAEROBIC Blood Culture adequate volume   Culture NO GROWTH 3 DAYS  Final   Report Status PENDING  Incomplete  C  difficile quick scan w PCR reflex     Status: None   Collection Time: 06/16/17  2:42 PM  Result Value Ref Range Status   C Diff antigen NEGATIVE NEGATIVE Final   C Diff toxin NEGATIVE NEGATIVE Final   C Diff interpretation No C. difficile detected.  Final  Gastrointestinal Panel by PCR , Stool  Status: None   Collection Time: 06/16/17  2:42 PM  Result Value Ref Range Status   Campylobacter species NOT DETECTED NOT DETECTED Final   Plesimonas shigelloides NOT DETECTED NOT DETECTED Final   Salmonella species NOT DETECTED NOT DETECTED Final   Yersinia enterocolitica NOT DETECTED NOT DETECTED Final   Vibrio species NOT DETECTED NOT DETECTED Final   Vibrio cholerae NOT DETECTED NOT DETECTED Final   Enteroaggregative E coli (EAEC) NOT DETECTED NOT DETECTED Final   Enteropathogenic E coli (EPEC) NOT DETECTED NOT DETECTED Final   Enterotoxigenic E coli (ETEC) NOT DETECTED NOT DETECTED Final   Shiga like toxin producing E coli (STEC) NOT DETECTED NOT DETECTED Final   Shigella/Enteroinvasive E coli (EIEC) NOT DETECTED NOT DETECTED Final   Cryptosporidium NOT DETECTED NOT DETECTED Final   Cyclospora cayetanensis NOT DETECTED NOT DETECTED Final   Entamoeba histolytica NOT DETECTED NOT DETECTED Final   Giardia lamblia NOT DETECTED NOT DETECTED Final   Adenovirus F40/41 NOT DETECTED NOT DETECTED Final   Astrovirus NOT DETECTED NOT DETECTED Final   Norovirus GI/GII NOT DETECTED NOT DETECTED Final   Rotavirus A NOT DETECTED NOT DETECTED Final   Sapovirus (I, II, IV, and V) NOT DETECTED NOT DETECTED Final    Coagulation Studies: No results for input(s): LABPROT, INR in the last 72 hours.  Urinalysis:  Recent Labs Lab 06/16/17 1429  COLORURINE YELLOW*  LABSPEC 1.017  PHURINE 5.0  GLUCOSEU NEGATIVE  HGBUR SMALL*  BILIRUBINUR NEGATIVE  KETONESUR NEGATIVE  PROTEINUR 100*  NITRITE NEGATIVE  LEUKOCYTESUR LARGE*    Lipid Panel: No results found for: CHOL, TRIG, HDL, CHOLHDL, VLDL,  LDLCALC  HgbA1C:  Lab Results  Component Value Date   HGBA1C 6.3 (H) 10/13/2015    Urine Drug Screen:  No results found for: LABOPIA, COCAINSCRNUR, LABBENZ, AMPHETMU, THCU, LABBARB  Alcohol Level: No results for input(s): ETH in the last 168 hours.  Other results: EKG: sinus rhythm at 79 bpm.  Imaging: Mr Brain Wo Contrast  Result Date: 06/19/2017 CLINICAL DATA:  Left arm weakness.  Stroke EXAM: MRI HEAD WITHOUT CONTRAST TECHNIQUE: Multiplanar, multiecho pulse sequences of the brain and surrounding structures were obtained without intravenous contrast. COMPARISON:  CT head 06/16/2017 FINDINGS: Brain: Acute infarct in the right pons measuring approximately 8 x 12 mm. No other acute infarct. Moderate chronic microvascular ischemic changes in the white matter and pons and bilateral thalamus. 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 Negative for mass lesion. Ventricle size normal. No shift of the midline structures. Vascular: Normal arterial flow voids Skull and upper cervical spine: Negative Sinuses/Orbits: Mild mucosal edema in the mastoid sinus bilaterally and in the ethmoid sinuses bilaterally. No orbital mass. Other: None IMPRESSION: Acute infarct right pons.  Moderate chronic ischemic change. Electronically Signed   By: Franchot Gallo M.D.   On: 06/19/2017 12:47    Assessment: 66 y.o. female presenting with weakness, worse on the left.  MRI of the brain reviewed and shows an acute right pontine infarct.  Likely secondary to small vessel disease.  Previous infarct affected the posterior circulation as well.  Patient on ASA and Plavix at home.   Carotid doppler shows moderate plaque bilaterally.  Remaining stroke work up pending.    Stroke Risk Factors - diabetes mellitus and hypertension  Plan: 1. PT consult, OT consult, Speech consult 2. Prophylactic therapy-Continue ASA and Plavix 3. Telemetry monitoring 4. Frequent neuro checks 5. BP  control     Alexis Goodell, MD Neurology [phone]  06/19/2017, 9:31 PM

## 2017-06-19 NOTE — Progress Notes (Signed)
Physical Therapy Treatment Patient Details Name: Brooke Hopkins MRN: [id number] DOB: 08-27-1951 Today's Date: 06/19/2017    History of Present Illness presented to ER secondary to generalized weakness status post fall, dysuria and foul urine; admitted under observation with hypertensive urgency and UTI.    PT Comments    Participated in exercises as described below.  To edge of bed with min assist only to bring hips fully to edge.  HOB raised and rails used.  Once sitting she was able to hold position without support.  Stood x 3 with walker and max a x 2.  While she was able to clear her hips off bed, she was unable to stand fully and was unsafe to attempt transfers or gait.  Overall improved mobility this session but pt voices continued frustration over inability to stand/transfer and L side weakness and edema in LUE.  Pt awaiting MRI.   Follow Up Recommendations  SNF     Equipment Recommendations       Recommendations for Other Services       Precautions / Restrictions Precautions Precautions: Fall Restrictions Weight Bearing Restrictions: No    Mobility  Bed Mobility Overal bed mobility: Needs Assistance Bed Mobility: Supine to Sit;Sit to Supine     Supine to sit: Min assist Sit to supine: Mod assist;+2 for physical assistance   General bed mobility comments: Overall improved to edge of bed with only slight assist to bring hips fully to edge.   Transfers Overall transfer level: Needs assistance Equipment used: Rolling walker (2 wheeled) Transfers: Sit to/from Stand Sit to Stand: Max assist;+2 physical assistance         General transfer comment: stood x 3 but unsafe to attempt transfer to chair due to weakness and not standing fully.  She was however able to clear hips off bed on each attempt which is improved from yesterday.  Ambulation/Gait             General Gait Details: unsafe/unable due to LE weakness and poor standing  tolerance/balance   Stairs            Wheelchair Mobility    Modified Rankin (Stroke Patients Only)       Balance Overall balance assessment: Needs assistance Sitting-balance support: No upper extremity supported;Feet supported Sitting balance-Leahy Scale: Good     Standing balance support: Bilateral upper extremity supported Standing balance-Leahy Scale: Zero                              Cognition Arousal/Alertness: Awake/alert Behavior During Therapy: WFL for tasks assessed/performed Overall Cognitive Status: Within Functional Limits for tasks assessed                                        Exercises Other Exercises Other Exercises: supine x 10 AAROM for ankle pumps, heel slides, ab/add and SLR.  Requires increased assist on left vs right    General Comments        Pertinent Vitals/Pain Pain Assessment: No/denies pain    Home Living                      Prior Function            PT Goals (current goals can now be found in the care plan section) Progress towards PT goals: Progressing toward  goals    Frequency    Min 2X/week      PT Plan      Co-evaluation              AM-PAC PT "6 Clicks" Daily Activity  Outcome Measure  Difficulty turning over in bed (including adjusting bedclothes, sheets and blankets)?: Total Difficulty moving from lying on back to sitting on the side of the bed? : Total Difficulty sitting down on and standing up from a chair with arms (e.g., wheelchair, bedside commode, etc,.)?: Total Help needed moving to and from a bed to chair (including a wheelchair)?: Total Help needed walking in hospital room?: Total Help needed climbing 3-5 steps with a railing? : Total 6 Click Score: 6    End of Session Equipment Utilized During Treatment: Gait belt Activity Tolerance: Patient tolerated treatment well Patient left: in bed;with call bell/phone within reach;with bed alarm set;with  family/visitor present         Time: 8288-3374 PT Time Calculation (min) (ACUTE ONLY): 23 min  Charges:  $Therapeutic Exercise: 8-22 mins $Therapeutic Activity: 8-22 mins                    G Codes:       Chesley Noon, PTA 06/19/17, 12:06 PM

## 2017-06-20 LAB — GLUCOSE, CAPILLARY
Glucose-Capillary: 164 mg/dL — ABNORMAL HIGH (ref 65–99)
Glucose-Capillary: 190 mg/dL — ABNORMAL HIGH (ref 65–99)
Glucose-Capillary: 249 mg/dL — ABNORMAL HIGH (ref 65–99)

## 2017-06-20 LAB — ECHOCARDIOGRAM COMPLETE
Height: 67 in
Weight: 4494.4 oz

## 2017-06-20 LAB — TSH: TSH: 2.53 u[IU]/mL (ref 0.350–4.500)

## 2017-06-20 LAB — LIPID PANEL
Cholesterol: 171 mg/dL (ref 0–200)
HDL: 35 mg/dL — ABNORMAL LOW (ref >40)
LDL Cholesterol: 105 mg/dL — ABNORMAL HIGH (ref 0–99)
Total CHOL/HDL Ratio: 4.9 RATIO
Triglycerides: 155 mg/dL — ABNORMAL HIGH (ref <150)
VLDL: 31 mg/dL (ref 0–40)

## 2017-06-20 MED ORDER — ATORVASTATIN CALCIUM 20 MG PO TABS: 40.0000 mg | ORAL_TABLET | Freq: Every day | ORAL | Status: DC

## 2017-06-20 MED ORDER — HYDRALAZINE HCL 25 MG PO TABS: 25.0000 mg | ORAL_TABLET | Freq: Three times a day (TID) | ORAL | 0 refills | Status: DC

## 2017-06-20 MED ORDER — CEPHALEXIN 500 MG PO CAPS: 500.0000 mg | ORAL_CAPSULE | Freq: Two times a day (BID) | ORAL | 0 refills | Status: DC

## 2017-06-20 MED ORDER — INSULIN ASPART 100 UNIT/ML ~~LOC~~ SOLN: 0.0000 [IU] | Freq: Three times a day (TID) | SUBCUTANEOUS | 11 refills | Status: DC

## 2017-06-20 MED ORDER — ONDANSETRON HCL 4 MG PO TABS: 4.0000 mg | ORAL_TABLET | Freq: Four times a day (QID) | ORAL | 0 refills | Status: DC | PRN

## 2017-06-20 MED ORDER — INSULIN LISPRO 100 UNIT/ML ~~LOC~~ SOLN: 0.0000 [IU] | Freq: Every day | SUBCUTANEOUS | 11 refills | Status: DC

## 2017-06-20 MED ORDER — ACETAMINOPHEN 325 MG PO TABS: 650.0000 mg | ORAL_TABLET | Freq: Four times a day (QID) | ORAL | Status: DC | PRN

## 2017-06-20 MED ORDER — OXYCODONE HCL 5 MG PO TABS: 5.0000 mg | ORAL_TABLET | Freq: Three times a day (TID) | ORAL | 0 refills | Status: DC | PRN

## 2017-06-20 NOTE — Progress Notes (Signed)
Subjective: Patient without further neurological symptoms.    Objective: Current vital signs: BP (!) 188/56 (BP Location: Left Arm)   Pulse 82   Temp 98.7 F (37.1 C) (Oral)   Resp 15   Ht 5\' 7"  (1.702 m)   Wt 127.4 kg (280 lb 14.4 oz)   SpO2 96%   BMI 44.00 kg/m  Vital signs in last 24 hours: Temp:  [97.8 F (36.6 C)-98.7 F (37.1 C)] 98.7 F (37.1 C) (06/22 0752) Pulse Rate:  [73-83] 82 (06/22 0755) Resp:  [15-18] 15 (06/22 0752) BP: (155-195)/(36-65) 188/56 (06/22 0755) SpO2:  [93 %-99 %] 96 % (06/22 0752)  Intake/Output from previous day: 06/21 0701 - 06/22 0700 In: 720 [P.O.:720] Out: 1950 [Urine:1950] Intake/Output this shift: Total I/O In: 480 [P.O.:480] Out: -  Nutritional status: Diet Heart Room service appropriate? Yes; Fluid consistency: Thin  Neurologic Exam: Mental Status: Alert, oriented, thought content appropriate.  Speech fluent without evidence of aphasia.  Able to follow 3 step commands without difficulty. Cranial Nerves: II: Discs flat bilaterally; Visual fields grossly normal, pupils equal, round, reactive to light and accommodation III,IV, VI: ptosis not present, extra-ocular motions intact bilaterally V,VII: smile symmetric, facial light touch sensation decreased on the left VIII: hearing normal bilaterally IX,X: gag reflex present XI: bilateral shoulder shrug XII: midline tongue extension Motor: Right :  Upper extremity   5/5                                      Left:     Upper extremity   4+/5. LUE drift             Lower extremity   5/5                                                  Lower extremity   4+/5.  External rotation of the LLE Tone and bulk:normal tone throughout; no atrophy noted Sensory: Pinprick and light touch decreased on the left upper and lower extremities    Lab Results: Basic Metabolic Panel:  Recent Labs Lab 06/16/17 1321 06/17/17 0626  NA 138 138  K 4.4 4.1  CL 106 107  CO2 27 24  GLUCOSE 164* 163*  BUN  28* 21*  CREATININE 1.14* 0.89  CALCIUM 8.6* 8.2*    Liver Function Tests:  Recent Labs Lab 06/16/17 1321  AST 15  ALT 13*  ALKPHOS 68  BILITOT 0.5  PROT 6.7  ALBUMIN 3.2*   No results for input(s): LIPASE, AMYLASE in the last 168 hours. No results for input(s): AMMONIA in the last 168 hours.  CBC:  Recent Labs Lab 06/16/17 1321 06/17/17 0626  WBC 7.6 7.1  HGB 9.9* 9.6*  HCT 29.9* 28.7*  MCV 80.5 80.4  PLT 300 261    Cardiac Enzymes:  Recent Labs Lab 06/16/17 1321  TROPONINI <0.03    Lipid Panel:  Recent Labs Lab 06/20/17 0554  CHOL 171  TRIG 155*  HDL 35*  CHOLHDL 4.9  VLDL 31  LDLCALC 105*    CBG:  Recent Labs Lab 06/19/17 0747 06/19/17 1327 06/19/17 1638 06/19/17 2034 06/20/17 0733  GLUCAP 179* 222* 229* 260* 164*    Microbiology: Results for orders placed or performed during the hospital encounter of 06/16/17  Urine culture     Status: Abnormal   Collection Time: 06/16/17  2:29 PM  Result Value Ref Range Status   Specimen Description URINE, CATHETERIZED  Final   Special Requests NONE  Final   Culture >=100,000 COLONIES/mL KLEBSIELLA PNEUMONIAE (A)  Final   Report Status 06/18/2017 FINAL  Final   Organism ID, Bacteria KLEBSIELLA PNEUMONIAE (A)  Final      Susceptibility   Klebsiella pneumoniae - MIC*    AMPICILLIN RESISTANT Resistant     CEFAZOLIN <=4 SENSITIVE Sensitive     CEFTRIAXONE <=1 SENSITIVE Sensitive     CIPROFLOXACIN <=0.25 SENSITIVE Sensitive     GENTAMICIN <=1 SENSITIVE Sensitive     IMIPENEM <=0.25 SENSITIVE Sensitive     NITROFURANTOIN 32 SENSITIVE Sensitive     TRIMETH/SULFA <=20 SENSITIVE Sensitive     AMPICILLIN/SULBACTAM <=2 SENSITIVE Sensitive     PIP/TAZO <=4 SENSITIVE Sensitive     Extended ESBL NEGATIVE Sensitive     * >=100,000 COLONIES/mL KLEBSIELLA PNEUMONIAE  Blood culture (routine x 2)     Status: None (Preliminary result)   Collection Time: 06/16/17  2:42 PM  Result Value Ref Range Status    Specimen Description BLOOD RIGHT AC  Final   Special Requests   Final    BOTTLES DRAWN AEROBIC AND ANAEROBIC Blood Culture adequate volume   Culture NO GROWTH 4 DAYS  Final   Report Status PENDING  Incomplete  Blood culture (routine x 2)     Status: None (Preliminary result)   Collection Time: 06/16/17  2:42 PM  Result Value Ref Range Status   Specimen Description BLOOD LEFT FA  Final   Special Requests   Final    BOTTLES DRAWN AEROBIC AND ANAEROBIC Blood Culture adequate volume   Culture NO GROWTH 4 DAYS  Final   Report Status PENDING  Incomplete  C difficile quick scan w PCR reflex     Status: None   Collection Time: 06/16/17  2:42 PM  Result Value Ref Range Status   C Diff antigen NEGATIVE NEGATIVE Final   C Diff toxin NEGATIVE NEGATIVE Final   C Diff interpretation No C. difficile detected.  Final  Gastrointestinal Panel by PCR , Stool     Status: None   Collection Time: 06/16/17  2:42 PM  Result Value Ref Range Status   Campylobacter species NOT DETECTED NOT DETECTED Final   Plesimonas shigelloides NOT DETECTED NOT DETECTED Final   Salmonella species NOT DETECTED NOT DETECTED Final   Yersinia enterocolitica NOT DETECTED NOT DETECTED Final   Vibrio species NOT DETECTED NOT DETECTED Final   Vibrio cholerae NOT DETECTED NOT DETECTED Final   Enteroaggregative E coli (EAEC) NOT DETECTED NOT DETECTED Final   Enteropathogenic E coli (EPEC) NOT DETECTED NOT DETECTED Final   Enterotoxigenic E coli (ETEC) NOT DETECTED NOT DETECTED Final   Shiga like toxin producing E coli (STEC) NOT DETECTED NOT DETECTED Final   Shigella/Enteroinvasive E coli (EIEC) NOT DETECTED NOT DETECTED Final   Cryptosporidium NOT DETECTED NOT DETECTED Final   Cyclospora cayetanensis NOT DETECTED NOT DETECTED Final   Entamoeba histolytica NOT DETECTED NOT DETECTED Final   Giardia lamblia NOT DETECTED NOT DETECTED Final   Adenovirus F40/41 NOT DETECTED NOT DETECTED Final   Astrovirus NOT DETECTED NOT DETECTED  Final   Norovirus GI/GII NOT DETECTED NOT DETECTED Final   Rotavirus A NOT DETECTED NOT DETECTED Final   Sapovirus (I, II, IV, and V) NOT DETECTED NOT DETECTED Final    Coagulation Studies:  No results for input(s): LABPROT, INR in the last 72 hours.  Imaging: Mr Brain Wo Contrast  Result Date: 06/19/2017 CLINICAL DATA:  Left arm weakness.  Stroke EXAM: MRI HEAD WITHOUT CONTRAST TECHNIQUE: Multiplanar, multiecho pulse sequences of the brain and surrounding structures were obtained without intravenous contrast. COMPARISON:  CT head 06/16/2017 FINDINGS: Brain: Acute infarct in the right pons measuring approximately 8 x 12 mm. No other acute infarct. Moderate chronic microvascular ischemic changes in the white matter and pons and bilateral thalamus. 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 Negative for mass lesion. Ventricle size normal. No shift of the midline structures. Vascular: Normal arterial flow voids Skull and upper cervical spine: Negative Sinuses/Orbits: Mild mucosal edema in the mastoid sinus bilaterally and in the ethmoid sinuses bilaterally. No orbital mass. Other: None IMPRESSION: Acute infarct right pons.  Moderate chronic ischemic change. Electronically Signed   By: Franchot Gallo M.D.   On: 06/19/2017 12:47    Medications:  I have reviewed the patient's current medications. Scheduled: . aspirin EC  81 mg Oral Daily  . cephALEXin  500 mg Oral Q12H  . [START ON 06/21/2017] cloNIDine  0.2 mg Transdermal Weekly  . clopidogrel  75 mg Oral Daily  . enoxaparin (LOVENOX) injection  40 mg Subcutaneous Q12H  . insulin aspart  0-15 Units Subcutaneous TID WC  . insulin aspart  0-5 Units Subcutaneous QHS  . oxybutynin  5 mg Oral BID  . sodium chloride flush  3 mL Intravenous Q12H    Assessment/Plan: No new neurological complaints.  Continues to work with therapy.  LDL 105.  Recommendations: 1.  Statin for lipid management with target LDL<70. 2.   Continue ASA and Plavix 3.  Continue therapy   LOS: 3 days   Alexis Goodell, MD Neurology [phone] 06/20/2017  10:16 AM

## 2017-06-20 NOTE — Discharge Planning (Addendum)
Patient IV and tele removed.  Insulin orders clarified per facility request and sliding scale faxed to [place], [place].  Discharge instructions printed, explained, educated and placed in facility packet.  Informed of suggested FU appts and needed scripts provided.  EMS contacted to transport and family aware of discharge plans. RN assessment and VAS revealed stability for DC.

## 2017-06-20 NOTE — Progress Notes (Signed)
Occupational Therapy Treatment Patient Details Name: Brooke Hopkins MRN: [id number] DOB: 03-28-1951 Today's Date: 06/20/2017    History of present illness Pt. is a 66 y.o. female who was admitted to [facility] with weakness s/p a fall, hypertensive Emergency, and UTI.   OT comments  Pt. continues to present with weakness, and limited AROM in the LUE. Pt. Performed AROM/AAROM tasks with the LUE. Pt. Education was provided about engaging her LUE in ADL, and IADL tasks. Pt. is planning to go to [facility] today for rehab. Pt. continues to present with limited ADL functioning, requiring extensive assist for LE ADL tasks, and functional mobility. Pt. would benefit from follow-up OT services.   Follow Up Recommendations  SNF    Equipment Recommendations  Other (comment)    Recommendations for Other Services      Precautions / Restrictions Precautions Precautions: Fall                                                     ADL either performed or assessed with clinical judgement   ADL Overall ADL's : Needs assistance/impaired Eating/Feeding: Set up   Grooming: Set up;Bed level Grooming Details (indicate cue type and reason): HOB elevated Upper Body Bathing: Minimal assistance;Bed level   Lower Body Bathing: Maximal assistance;Bed level   Upper Body Dressing : Set up;Minimal assistance   Lower Body Dressing: Maximal assistance;Moderate assistance;Bed level               Functional mobility during ADLs: Maximal assistance;+2 for physical assistance General ADL Comments: Pt. education was provided about engaging her LUE during ADL tasks, positioning, and A/E.     Vision Baseline Vision/History: Wears glasses Wears Glasses: Reading only Patient Visual Report: No change from baseline     Perception     Praxis      Cognition Arousal/Alertness: Awake/alert Behavior During Therapy: WFL for tasks assessed/performed Overall Cognitive Status: Within  Functional Limits for tasks assessed                                          Exercises     Shoulder Instructions       General Comments      Pertinent Vitals/ Pain          Home Living                                          Prior Functioning/Environment              Frequency  Min 2X/week        Progress Toward Goals  OT Goals(current goals can now be found in the care plan section)  Progress towards OT goals: Progressing toward goals  Acute Rehab OT Goals Patient Stated Goal: to get my strength back and be safe at home OT Goal Formulation: With patient/family Potential to Achieve Goals: Good  Plan      Co-evaluation                 AM-PAC PT "6 Clicks" Daily Activity     Outcome Measure   Help from another person eating meals?: None  Help from another person taking care of personal grooming?: None Help from another person toileting, which includes using toliet, bedpan, or urinal?: A Lot Help from another person bathing (including washing, rinsing, drying)?: A Lot Help from another person to put on and taking off regular upper body clothing?: A Little   6 Click Score: 15    End of Session    OT Visit Diagnosis: Muscle weakness (generalized) (M62.81)   Activity Tolerance Patient tolerated treatment well   Patient Left in bed;with call bell/phone within reach;with bed alarm set;with family/visitor present   Nurse Communication      Functional Assessment Tool Used: AM-PAC 6 Clicks Daily Activity Functional Limitation: Self care Self Care Current Status (S6015): At least 40 percent but less than 60 percent impaired, limited or restricted Self Care Goal Status (I1537): At least 40 percent but less than 60 percent impaired, limited or restricted   Time: 1330-1350 OT Time Calculation (min): 20 min  Charges: OT G-codes **NOT FOR INPATIENT CLASS** Functional Assessment Tool Used: AM-PAC 6 Clicks Daily  Activity Functional Limitation: Self care Self Care Current Status (H4327): At least 40 percent but less than 60 percent impaired, limited or restricted Self Care Goal Status (M1470): At least 40 percent but less than 60 percent impaired, limited or restricted OT General Charges $OT Visit: 1 Procedure OT Treatments $Self Care/Home Management : 8-22 mins  Harrel Carina, MS, OTR/L  Harrel Carina, MS, OTR/L 06/20/2017, 2:15 PM

## 2017-06-20 NOTE — Consult Note (Signed)
Referring Physician: Gouru    Chief Complaint: Left sided weakness  HPI: Brooke Hopkins is an 66 y.o. female with a history of stroke and left residual deficits who reports that on Friday she was at baseline which is wheelchair bound but able to make transfers.  She found herself unable to make a transfer during the evening and fell.  Since that time she has noticed worse left sided weakness and numbness.  With no improvement in her symptoms patient presented for evaluation.  Initial NIHSS of 7.    Date last known well: Date: 06/13/2017 Time last known well: Time: 21:00 tPA Given: No: Outside treatment window  Past Medical History:  Diagnosis Date  . Chronic kidney disease   . Diabetes mellitus   . Hypertension   . Stroke [facility])     Past Surgical History:  Procedure Laterality Date  . ABDOMINAL HYSTERECTOMY    . CHOLECYSTECTOMY    . HAMMER TOE SURGERY    . KNEE ARTHROSCOPY    . LUMBAR DISC SURGERY  11/07/11  . LUMBAR WOUND DEBRIDEMENT  11/27/2011   Procedure: LUMBAR WOUND DEBRIDEMENT;  Surgeon: Eustace Moore;  Location: [facility];  Service: Neurosurgery;  Laterality: N/A;  . TONSILLECTOMY      Family History  Problem Relation Age of Onset  . CAD Mother   . Hypertension Mother   . COPD Father   . Diabetes Sister   . Hypertension Sister    Social History:  reports that she has never smoked. She has never used smokeless tobacco. She reports that she does not drink alcohol or use drugs.  Allergies: No Known Allergies  Medications:  I have reviewed the patient's current medications. Prior to Admission:  Prescriptions Prior to Admission  Medication Sig Dispense Refill Last Dose  . aspirin EC 81 MG tablet Take 81 mg by mouth daily.   06/15/2017 at 0800  . cloNIDine (CATAPRES - DOSED IN MG/24 HR) 0.2 mg/24hr patch Place 1 patch (0.2 mg total) onto the skin once a week. 4 patch 0 06/14/2017 at 0800  . clopidogrel (PLAVIX) 75 MG tablet Take 75 mg by mouth daily.   Past Month  at Unknown time  . insulin NPH-regular Human (NOVOLIN 70/30) (70-30) 100 UNIT/ML injection Inject 35 Units into the skin 2 (two) times daily. (Patient taking differently: Inject 40 Units into the skin 2 (two) times daily. ) 10 mL 11 06/15/2017 at 1800  . oxybutynin (DITROPAN) 5 MG tablet Take 5 mg by mouth 2 (two) times daily.   06/15/2017 at 2000  . atorvastatin (LIPITOR) 80 MG tablet Take 1 tablet (80 mg total) by mouth daily at 6 PM. (Patient not taking: Reported on 04/30/2017) 30 tablet 0 Not Taking at Unknown time  . cefTAZidime 1 g in dextrose 5 % 50 mL Inject 1 g into the vein every 8 (eight) hours. (Patient not taking: Reported on 04/30/2017) 42 ampule 0 Not Taking at Unknown time  . collagenase (SANTYL) ointment Apply topically daily. (Patient not taking: Reported on 04/30/2017) 30 g 0 Not Taking at Unknown time  . hydrALAZINE (APRESOLINE) 25 MG tablet Take 1 tablet (25 mg total) by mouth every 8 (eight) hours. (Patient not taking: Reported on 04/30/2017) 90 tablet 0 Not Taking at Unknown time  . nystatin cream (MYCOSTATIN) Apply topically 2 (two) times daily. (Patient not taking: Reported on 04/30/2017) 30 g 0 Not Taking at Unknown time  . oxyCODONE (ROXICODONE) 5 MG immediate release tablet Take 1 tablet (5  mg total) by mouth every 8 (eight) hours as needed. (Patient not taking: Reported on 06/16/2017) 20 tablet 0 Completed Course at Unknown time  . traMADol (ULTRAM) 50 MG tablet Take 1 tablet (50 mg total) by mouth every 6 (six) hours as needed for moderate pain. (Patient not taking: Reported on 11/11/2016) 30 tablet 0 Not Taking at Unknown time   Scheduled: . aspirin EC  81 mg Oral Daily  . cephALEXin  500 mg Oral Q12H  . [START ON 06/21/2017] cloNIDine  0.2 mg Transdermal Weekly  . clopidogrel  75 mg Oral Daily  . enoxaparin (LOVENOX) injection  40 mg Subcutaneous Q12H  . insulin aspart  0-15 Units Subcutaneous TID WC  . insulin aspart  0-5 Units Subcutaneous QHS  . oxybutynin  5 mg Oral BID  .  sodium chloride flush  3 mL Intravenous Q12H    ROS: History obtained from the patient  General ROS: negative for - chills, fatigue, fever, night sweats, weight gain or weight loss Psychological ROS: negative for - behavioral disorder, hallucinations, memory difficulties, mood swings or suicidal ideation Ophthalmic ROS: negative for - blurry vision, double vision, eye pain or loss of vision ENT ROS: negative for - epistaxis, nasal discharge, oral lesions, sore throat, tinnitus or vertigo Allergy and Immunology ROS: negative for - hives or itchy/watery eyes Hematological and Lymphatic ROS: negative for - bleeding problems, bruising or swollen lymph nodes Endocrine ROS: negative for - galactorrhea, hair pattern changes, polydipsia/polyuria or temperature intolerance Respiratory ROS: negative for - cough, hemoptysis, shortness of breath or wheezing Cardiovascular ROS: LE edema Gastrointestinal ROS: negative for - abdominal pain, diarrhea, hematemesis, nausea/vomiting or stool incontinence Genito-Urinary ROS: negative for - dysuria, hematuria, incontinence or urinary frequency/urgency Musculoskeletal ROS: neck pain Neurological ROS: as noted in HPI Dermatological ROS: negative for rash and skin lesion changes  Physical Examination: Blood pressure (!) 165/56, pulse 83, temperature 98.1 F (36.7 C), temperature source Oral, resp. rate 16, height 5\' 7"  (1.702 m), weight 127.4 kg (280 lb 14.4 oz), SpO2 99 %.  Gen: NAD HEENT-  Normocephalic, no lesions, without obvious abnormality.  Normal external eye and conjunctiva.  Normal TM's bilaterally.  Normal auditory canals and external ears. Normal external nose, mucus membranes and septum.  Normal pharynx. Cardiovascular- S1, S2 normal, pulses palpable throughout   Lungs- chest clear, no wheezing, rales, normal symmetric air entry Abdomen- soft, non-tender; bowel sounds normal; no masses,  no organomegaly Extremities- BLE edema Lymph-no adenopathy  palpable Musculoskeletal-no joint tenderness, deformity or swelling Skin-warm and dry, no hyperpigmentation, vitiligo, or suspicious lesions  Neurological Examination   Mental Status: Alert, oriented, thought content appropriate.  Speech fluent without evidence of aphasia.  Able to follow 3 step commands without difficulty. Cranial Nerves: II: Discs flat bilaterally; Visual fields grossly normal, pupils equal, round, reactive to light and accommodation III,IV, VI: ptosis not present, extra-ocular motions intact bilaterally V,VII: smile symmetric, facial light touch sensation decreased on the left VIII: hearing normal bilaterally IX,X: gag reflex present XI: bilateral shoulder shrug XII: midline tongue extension Motor: Right : Upper extremity   5/5    Left:     Upper extremity   4+/5. LUE drift  Lower extremity   5/5     Lower extremity   4+/5.  External rotation of the LLE Tone and bulk:normal tone throughout; no atrophy noted Sensory: Pinprick and light touch decreased on the left upper and lower extremities Deep Tendon Reflexes: 2+ in the upper extremities and absent in the lower  extremities Plantars: Right: upgoing   Left: left Cerebellar: normal finger-to-nose and normal heel-to-shin testing bilaterally Gait: not tested due to safety concerns    Laboratory Studies:  Basic Metabolic Panel:  Recent Labs Lab 06/16/17 1321 06/17/17 0626  NA 138 138  K 4.4 4.1  CL 106 107  CO2 27 24  GLUCOSE 164* 163*  BUN 28* 21*  CREATININE 1.14* 0.89  CALCIUM 8.6* 8.2*    Liver Function Tests:  Recent Labs Lab 06/16/17 1321  AST 15  ALT 13*  ALKPHOS 68  BILITOT 0.5  PROT 6.7  ALBUMIN 3.2*   No results for input(s): LIPASE, AMYLASE in the last 168 hours. No results for input(s): AMMONIA in the last 168 hours.  CBC:  Recent Labs Lab 06/16/17 1321 06/17/17 0626  WBC 7.6 7.1  HGB 9.9* 9.6*  HCT 29.9* 28.7*  MCV 80.5 80.4  PLT 300 261    Cardiac Enzymes:  Recent  Labs Lab 06/16/17 1321  TROPONINI <0.03    BNP: Invalid input(s): POCBNP  CBG:  Recent Labs Lab 06/18/17 2111 06/19/17 0747 06/19/17 1327 06/19/17 1638 06/19/17 2034  GLUCAP 163* 179* 222* 229* 260*    Microbiology: Results for orders placed or performed during the hospital encounter of 06/16/17  Urine culture     Status: Abnormal   Collection Time: 06/16/17  2:29 PM  Result Value Ref Range Status   Specimen Description URINE, CATHETERIZED  Final   Special Requests NONE  Final   Culture >=100,000 COLONIES/mL KLEBSIELLA PNEUMONIAE (A)  Final   Report Status 06/18/2017 FINAL  Final   Organism ID, Bacteria KLEBSIELLA PNEUMONIAE (A)  Final      Susceptibility   Klebsiella pneumoniae - MIC*    AMPICILLIN RESISTANT Resistant     CEFAZOLIN <=4 SENSITIVE Sensitive     CEFTRIAXONE <=1 SENSITIVE Sensitive     CIPROFLOXACIN <=0.25 SENSITIVE Sensitive     GENTAMICIN <=1 SENSITIVE Sensitive     IMIPENEM <=0.25 SENSITIVE Sensitive     NITROFURANTOIN 32 SENSITIVE Sensitive     TRIMETH/SULFA <=20 SENSITIVE Sensitive     AMPICILLIN/SULBACTAM <=2 SENSITIVE Sensitive     PIP/TAZO <=4 SENSITIVE Sensitive     Extended ESBL NEGATIVE Sensitive     * >=100,000 COLONIES/mL KLEBSIELLA PNEUMONIAE  Blood culture (routine x 2)     Status: None (Preliminary result)   Collection Time: 06/16/17  2:42 PM  Result Value Ref Range Status   Specimen Description BLOOD RIGHT AC  Final   Special Requests   Final    BOTTLES DRAWN AEROBIC AND ANAEROBIC Blood Culture adequate volume   Culture NO GROWTH 3 DAYS  Final   Report Status PENDING  Incomplete  Blood culture (routine x 2)     Status: None (Preliminary result)   Collection Time: 06/16/17  2:42 PM  Result Value Ref Range Status   Specimen Description BLOOD LEFT FA  Final   Special Requests   Final    BOTTLES DRAWN AEROBIC AND ANAEROBIC Blood Culture adequate volume   Culture NO GROWTH 3 DAYS  Final   Report Status PENDING  Incomplete  C  difficile quick scan w PCR reflex     Status: None   Collection Time: 06/16/17  2:42 PM  Result Value Ref Range Status   C Diff antigen NEGATIVE NEGATIVE Final   C Diff toxin NEGATIVE NEGATIVE Final   C Diff interpretation No C. difficile detected.  Final  Gastrointestinal Panel by PCR , Stool  Status: None   Collection Time: 06/16/17  2:42 PM  Result Value Ref Range Status   Campylobacter species NOT DETECTED NOT DETECTED Final   Plesimonas shigelloides NOT DETECTED NOT DETECTED Final   Salmonella species NOT DETECTED NOT DETECTED Final   Yersinia enterocolitica NOT DETECTED NOT DETECTED Final   Vibrio species NOT DETECTED NOT DETECTED Final   Vibrio cholerae NOT DETECTED NOT DETECTED Final   Enteroaggregative E coli (EAEC) NOT DETECTED NOT DETECTED Final   Enteropathogenic E coli (EPEC) NOT DETECTED NOT DETECTED Final   Enterotoxigenic E coli (ETEC) NOT DETECTED NOT DETECTED Final   Shiga like toxin producing E coli (STEC) NOT DETECTED NOT DETECTED Final   Shigella/Enteroinvasive E coli (EIEC) NOT DETECTED NOT DETECTED Final   Cryptosporidium NOT DETECTED NOT DETECTED Final   Cyclospora cayetanensis NOT DETECTED NOT DETECTED Final   Entamoeba histolytica NOT DETECTED NOT DETECTED Final   Giardia lamblia NOT DETECTED NOT DETECTED Final   Adenovirus F40/41 NOT DETECTED NOT DETECTED Final   Astrovirus NOT DETECTED NOT DETECTED Final   Norovirus GI/GII NOT DETECTED NOT DETECTED Final   Rotavirus A NOT DETECTED NOT DETECTED Final   Sapovirus (I, II, IV, and V) NOT DETECTED NOT DETECTED Final    Coagulation Studies: No results for input(s): LABPROT, INR in the last 72 hours.  Urinalysis:  Recent Labs Lab 06/16/17 1429  COLORURINE YELLOW*  LABSPEC 1.017  PHURINE 5.0  GLUCOSEU NEGATIVE  HGBUR SMALL*  BILIRUBINUR NEGATIVE  KETONESUR NEGATIVE  PROTEINUR 100*  NITRITE NEGATIVE  LEUKOCYTESUR LARGE*    Lipid Panel: No results found for: CHOL, TRIG, HDL, CHOLHDL, VLDL,  LDLCALC  HgbA1C:  Lab Results  Component Value Date   HGBA1C 6.3 (H) 10/13/2015    Urine Drug Screen:  No results found for: LABOPIA, COCAINSCRNUR, LABBENZ, AMPHETMU, THCU, LABBARB  Alcohol Level: No results for input(s): ETH in the last 168 hours.  Other results: EKG: normal sinus rhythm  Imaging: Mr Brain Wo Contrast  Result Date: 06/19/2017 CLINICAL DATA:  Left arm weakness.  Stroke EXAM: MRI HEAD WITHOUT CONTRAST TECHNIQUE: Multiplanar, multiecho pulse sequences of the brain and surrounding structures were obtained without intravenous contrast. COMPARISON:  CT head 06/16/2017 FINDINGS: Brain: Acute infarct in the right pons measuring approximately 8 x 12 mm. No other acute infarct. Moderate chronic microvascular ischemic changes in the white matter and pons and bilateral thalamus. 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 Negative for mass lesion. Ventricle size normal. No shift of the midline structures. Vascular: Normal arterial flow voids Skull and upper cervical spine: Negative Sinuses/Orbits: Mild mucosal edema in the mastoid sinus bilaterally and in the ethmoid sinuses bilaterally. No orbital mass. Other: None IMPRESSION: Acute infarct right pons.  Moderate chronic ischemic change. Electronically Signed   By: Franchot Gallo M.D.   On: 06/19/2017 12:47    Assessment: 66 y.o. female presenting with weakness, worse on the left.  MRI of the brain reviewed and shows an acute right pontine infarct.  Likely secondary to small vessel disease.  Previous infarct affected the posterior circulation as well.  Patient on ASA and Plavix at home.   Carotid doppler shows moderate plaque bilaterally.  Remaining stroke work up pending.    Stroke Risk Factors - diabetes mellitus and hypertension  Plan: 1. PT consult, OT consult, Speech consult 2. Prophylactic therapy-Continue ASA and Plavix 3. Telemetry monitoring 4. Frequent neuro checks 5. BP  control     Alexis Goodell, MD Neurology [phone] 06/19/2017, 9:31  PM

## 2017-06-20 NOTE — Discharge Instructions (Signed)
Follow-up with neurology Dr. Melrose Nakayama in 2 weeks Follow-up with primary care physician at the facility in 3-5 days

## 2017-06-20 NOTE — Progress Notes (Signed)
Physical Therapy Treatment Patient Details Name: Brooke Hopkins MRN: [id number] DOB: Aug 17, 1951 Today's Date: 06/20/2017    History of Present Illness Pt. is a 66 y.o. female who was admitted to [facility] with weakness s/p a fall, hypertensive Emergency, and UTI.    PT Comments    Per chart review, patient now with documented acute R pontine infarct.  Orders for continued therapy received.  L UE/LE strength generally comparable to initial eval, with slight improvement in function and overall control.   Continues to require extensive assist +2 for sit/stand efforts, but able to achieve full stance position this date.  Constant cuing/facilitation for postural extension, as patient very fearful of forward weight shift and unable to initiate/complete full postural extension due to persistent weakness throughout hips and LEs. Very motivated to participate, progress as able; anticipate slow, but steady, progress towards mobility goals with post-acute rehab services.    Follow Up Recommendations  SNF     Equipment Recommendations       Recommendations for Other Services       Precautions / Restrictions Precautions Precautions: Fall Restrictions Weight Bearing Restrictions: No    Mobility  Bed Mobility Overal bed mobility: Needs Assistance Bed Mobility: Supine to Sit;Sit to Supine     Supine to sit: Mod assist Sit to supine: Mod assist;+2 for physical assistance      Transfers Overall transfer level: Needs assistance Equipment used: Rolling walker (2 wheeled) Transfers: Sit to/from Stand Sit to Stand: Mod assist;+2 physical assistance         General transfer comment: required elevated bed surface, multiple attempts and +2 assist to complete.  Able to achieve full stance this date, but requires constant assist for full postural extension and standing balance.  Ambulation/Gait             General Gait Details: unsafe/unable due to LE weakness and poor standing  tolerance/balance   Stairs            Wheelchair Mobility    Modified Rankin (Stroke Patients Only)       Balance Overall balance assessment: Needs assistance Sitting-balance support: No upper extremity supported;Feet supported Sitting balance-Leahy Scale: Good     Standing balance support: Bilateral upper extremity supported Standing balance-Leahy Scale: Zero                              Cognition Arousal/Alertness: Awake/alert Behavior During Therapy: WFL for tasks assessed/performed Overall Cognitive Status: Within Functional Limits for tasks assessed                                        Exercises Other Exercises Other Exercises:  Pre sit/stand edge of bed, close sup to initiate co-contraction of bilat quads/hams.  Forward weight shift and overall lift off improving with repetition and comfort with functional movement pattern. Sit/stand x5 with RW, mod assist +2 for lift off, standing balance and overall postural extension; sit/stand x2 with bilat HHA, mod assist +2.  Unable to achieve full knee ext on either side; very fearful of anterior weight shift.  Extensive discussion about importance of forced use of L UE with ADLs, functional activities and isolated therex to promote return of functional abilities.  Reviewed examples of therex/activities to incorporate as able.  Patient voiced understanding/agreement.    General Comments  Pertinent Vitals/Pain Pain Assessment: No/denies pain    Home Living                      Prior Function            PT Goals (current goals can now be found in the care plan section) Acute Rehab PT Goals Patient Stated Goal: to get my strength back and be safe at home PT Goal Formulation: With patient/family Time For Goal Achievement: 07/02/17 Potential to Achieve Goals: Fair Progress towards PT goals: Progressing toward goals    Frequency    Min 2X/week      PT Plan  Current plan remains appropriate    Co-evaluation              AM-PAC PT "6 Clicks" Daily Activity  Outcome Measure  Difficulty turning over in bed (including adjusting bedclothes, sheets and blankets)?: Total Difficulty moving from lying on back to sitting on the side of the bed? : Total Difficulty sitting down on and standing up from a chair with arms (e.g., wheelchair, bedside commode, etc,.)?: Total Help needed moving to and from a bed to chair (including a wheelchair)?: Total Help needed walking in hospital room?: Total Help needed climbing 3-5 steps with a railing? : Total 6 Click Score: 6    End of Session Equipment Utilized During Treatment: Gait belt Activity Tolerance: Patient tolerated treatment well Patient left: in bed;with call bell/phone within reach;with bed alarm set Nurse Communication: Mobility status PT Visit Diagnosis: Muscle weakness (generalized) (M62.81);Difficulty in walking, not elsewhere classified (R26.2);History of falling (Z91.81)     Time: 8811-0315 PT Time Calculation (min) (ACUTE ONLY): 32 min  Charges:  $Therapeutic Activity: 23-37 mins                    G Codes:       Krystena Reitter H. Owens Shark, PT, DPT, NCS 06/20/17, 3:55 PM [phone]

## 2017-06-20 NOTE — Clinical Social Work Note (Signed)
Patient to be d/c'ed today to [facility].  Patient and family agreeable to plans will transport via ems RN to call report.  Patient to notify her husband about discharge for today.  Evette Cristal, MSW, [place]

## 2017-06-20 NOTE — Progress Notes (Signed)
Report called to [facility], EMS called for transportation. Patient prepared for transport. No acute distress, awaiting EMS.

## 2017-06-20 NOTE — Discharge Summary (Signed)
[facility] NAME: Brooke Hopkins    MR#:  [id number]  DATE OF BIRTH:  06-Jan-1951  DATE OF ADMISSION:  06/16/2017 ADMITTING PHYSICIAN: Saundra Shelling, MD  DATE OF DISCHARGE: 06/20/17 PRIMARY CARE PHYSICIAN: Leonel Ramsay, MD    ADMISSION DIAGNOSIS:  Left leg numbness [R20.0] Left leg weakness [R29.898] Inability to walk [R26.2] Acute cystitis without hematuria [N30.00] Hypertensive urgency [I16.0]  DISCHARGE DIAGNOSIS:  Active Problems:   UTI (urinary tract infection)   Hypertensive urgency new acute CVA  SECONDARY DIAGNOSIS:   Past Medical History:  Diagnosis Date  . Chronic kidney disease   . Diabetes mellitus   . Hypertension   . Stroke [facility])     HOSPITAL COURSE:  HISTORY OF PRESENT ILLNESS: Brooke Hopkins  is a 66 y.o. female with a known history of Chronic kidney disease, type 2 diabetes mellitus, hypertension, CVA presented to the emergency room with generalized weakness. Patient complains of dysuria and foul-smelling urine. She was at a rehabilitation center for more than 2 weeks and has been discharged to home. On friday her legs gave away and she couldn't balance and landed on the floor. During the workup in the emergency room her urine showed infection and she received IV antibiotics. She has a walker, cane and wheelchair at home. No history of any head injury. No complaints of any chest pain. No numbness and tingling in any part of the body. In the emergency room her blood pressure has been elevated systolic more than 376 mm of hg. She was worked up with CT head which showed no acute intracranial abnormality. Hospitalist service was consulted for further care of the patient.  # worsening left sided weakness- new acute CVA Mri brain -acute infarct rt Pons Echo -ejection fraction 60-65% no cardiac source of emboli Carotid Dopplers were done in February with no significant stenosis Continue aspirin,  Plavix Appreciate neurology recommendations Patient is swallowing fine according to the nurse Speech therapy evaluation for slurry speech-recommending speech therapy follow-up for speech evaluation at nursing home LDL 105-Lipitor 40 started TSH 2.53 normal  physical therapy recommends skilled nursing facility -    # Hypertensive urgency will allow permissive hypertension with new acute CVA Hold antihypertensives amlodipine  . Resume hydralazine home medication Continue clonidine  # Dehydration- improved with IV fluid. Stop IV fluids today  # . klebsiella urinary tract infection. Stop ceftriaxone. D/C HOME  with by mouth Keflex  # .Diabetes mellitus-  sliding scale coverage. Holding long-acting insulin at this time  # Chronic kidney disease stage 3  Needs skilled nursing facility after discharge.       DISCHARGE CONDITIONS:   FAIR  CONSULTS OBTAINED:  Treatment Team:  Alexis Goodell, MD   PROCEDURES  ECHO   DRUG ALLERGIES:  No Known Allergies  DISCHARGE MEDICATIONS:   Current Discharge Medication List    START taking these medications   Details  acetaminophen (TYLENOL) 325 MG tablet Take 2 tablets (650 mg total) by mouth every 6 (six) hours as needed for mild pain (or Fever >/= 101).    cephALEXin (KEFLEX) 500 MG capsule Take 1 capsule (500 mg total) by mouth 2 (two) times daily. Qty: 10 capsule, Refills: 0    insulin aspart (NOVOLOG) 100 UNIT/ML injection Inject 0-15 Units into the skin 3 (three) times daily with meals. Qty: 10 mL, Refills: 11    insulin lispro (HUMALOG) 100 UNIT/ML injection Inject 0-0.05 mLs (0-5 Units total) into the skin  at bedtime. Qty: 10 mL, Refills: 11    ondansetron (ZOFRAN) 4 MG tablet Take 1 tablet (4 mg total) by mouth every 6 (six) hours as needed for nausea. Qty: 20 tablet, Refills: 0      CONTINUE these medications which have CHANGED   Details  hydrALAZINE (APRESOLINE) 25 MG tablet Take 1 tablet (25 mg  total) by mouth every 8 (eight) hours. Qty: 90 tablet, Refills: 0    oxyCODONE (ROXICODONE) 5 MG immediate release tablet Take 1 tablet (5 mg total) by mouth every 8 (eight) hours as needed for moderate pain. Qty: 20 tablet, Refills: 0      CONTINUE these medications which have NOT CHANGED   Details  aspirin EC 81 MG tablet Take 81 mg by mouth daily.    cloNIDine (CATAPRES - DOSED IN MG/24 HR) 0.2 mg/24hr patch Place 1 patch (0.2 mg total) onto the skin once a week. Qty: 4 patch, Refills: 0    clopidogrel (PLAVIX) 75 MG tablet Take 75 mg by mouth daily.    oxybutynin (DITROPAN) 5 MG tablet Take 5 mg by mouth 2 (two) times daily.    atorvastatin (LIPITOR) 80 MG tablet Take 1 tablet (80 mg total) by mouth daily at 6 PM. Qty: 30 tablet, Refills: 0    collagenase (SANTYL) ointment Apply topically daily. Qty: 30 g, Refills: 0      STOP taking these medications     insulin NPH-regular Human (NOVOLIN 70/30) (70-30) 100 UNIT/ML injection      cefTAZidime 1 g in dextrose 5 % 50 mL      nystatin cream (MYCOSTATIN)      traMADol (ULTRAM) 50 MG tablet          DISCHARGE INSTRUCTIONS:   Follow-up with neurology Dr. Melrose Nakayama in 2 weeks Follow-up with primary care physician at the facility in 3-5 days Needs speech therapy follow-up at the nursing home facility for speech therapy     DIET:  Cardiac diet and Diabetic diet  DISCHARGE CONDITION:  Stable  ACTIVITY:  Activity as tolerated PER pt  OXYGEN:  Home Oxygen: No.   Oxygen Delivery: room air  DISCHARGE LOCATION:  nursing home   If you experience worsening of your admission symptoms, develop shortness of breath, life threatening emergency, suicidal or homicidal thoughts you must seek medical attention immediately by calling 911 or calling your MD immediately  if symptoms less severe.  You Must read complete instructions/literature along with all the possible adverse reactions/side effects for all the Medicines you take  and that have been prescribed to you. Take any new Medicines after you have completely understood and accpet all the possible adverse reactions/side effects.   Please note  You were cared for by a hospitalist during your hospital stay. If you have any questions about your discharge medications or the care you received while you were in the hospital after you are discharged, you can call the unit and asked to speak with the hospitalist on call if the hospitalist that took care of you is not available. Once you are discharged, your primary care physician will handle any further medical issues. Please note that NO REFILLS for any discharge medications will be authorized once you are discharged, as it is imperative that you return to your primary care physician (or establish a relationship with a primary care physician if you do not have one) for your aftercare needs so that they can reassess your need for medications and monitor your lab values.  Today  Chief Complaint  Patient presents with  . Weakness   Patient's speech is better today and left-sided weakness is slightly better but not at her baseline  ROS:  CONSTITUTIONAL: Denies fevers, chills. Denies any fatigue, weakness.  EYES: Denies blurry vision, double vision, eye pain. EARS, NOSE, THROAT: Denies tinnitus, ear pain, hearing loss. RESPIRATORY: Denies cough, wheeze, shortness of breath.  CARDIOVASCULAR: Denies chest pain, palpitations, edema.  GASTROINTESTINAL: Denies nausea, vomiting, diarrhea, abdominal pain. Denies bright red blood per rectum. GENITOURINARY: Denies dysuria, hematuria. ENDOCRINE: Denies nocturia or thyroid problems. HEMATOLOGIC AND LYMPHATIC: Denies easy bruising or bleeding. SKIN: Denies rash or lesion. MUSCULOSKELETAL: Denies pain in neck, back, shoulder, knees, hips or arthritic symptoms.  NEUROLOGIC: Reporting left-sided weakness and slightly improved slurry speech PSYCHIATRIC: Denies anxiety or depressive  symptoms.   VITAL SIGNS:  Blood pressure (!) 153/65, pulse 78, temperature 98.7 F (37.1 C), temperature source Oral, resp. rate 18, height 5\' 7"  (1.702 m), weight 127.4 kg (280 lb 14.4 oz), SpO2 96 %.  I/O:    Intake/Output Summary (Last 24 hours) at 06/20/17 1444 Last data filed at 06/20/17 1359  Gross per 24 hour  Intake             1080 ml  Output             1200 ml  Net             -120 ml    PHYSICAL EXAMINATION:  GENERAL:  66 y.o.-year-old patient lying in the bed with no acute distress.  EYES: Pupils equal, round, reactive to light and accommodation. No scleral icterus. Extraocular muscles intact.  HEENT: Head atraumatic, normocephalic. Oropharynx and nasopharynx clear.  NECK:  Supple, no jugular venous distention. No thyroid enlargement, no tenderness.  LUNGS: Normal breath sounds bilaterally, no wheezing, rales,rhonchi or crepitation. No use of accessory muscles of respiration.  CARDIOVASCULAR: S1, S2 normal. No murmurs, rubs, or gallops.  ABDOMEN: Soft, non-tender, non-distended. Bowel sounds present. No organomegaly or mass.  EXTREMITIES: No pedal edema, cyanosis, or clubbing.  NEUROLOGIC: Left-sided weakness, right upper extremity and lower extent is intact PSYCHIATRIC: The patient is alert and oriented x 3.  SKIN: No obvious rash, lesion, or ulcer.   DATA REVIEW:   CBC  Recent Labs Lab 06/17/17 0626  WBC 7.1  HGB 9.6*  HCT 28.7*  PLT 261    Chemistries   Recent Labs Lab 06/16/17 1321 06/17/17 0626  NA 138 138  K 4.4 4.1  CL 106 107  CO2 27 24  GLUCOSE 164* 163*  BUN 28* 21*  CREATININE 1.14* 0.89  CALCIUM 8.6* 8.2*  AST 15  --   ALT 13*  --   ALKPHOS 68  --   BILITOT 0.5  --     Cardiac Enzymes  Recent Labs Lab 06/16/17 1321  [place] <0.03    Microbiology Results  Results for orders placed or performed during the hospital encounter of 06/16/17  Urine culture     Status: Abnormal   Collection Time: 06/16/17  2:29 PM  Result  Value Ref Range Status   Specimen Description URINE, CATHETERIZED  Final   Special Requests NONE  Final   Culture >=100,000 COLONIES/mL KLEBSIELLA PNEUMONIAE (A)  Final   Report Status 06/18/2017 FINAL  Final   Organism ID, Bacteria KLEBSIELLA PNEUMONIAE (A)  Final      Susceptibility   Klebsiella pneumoniae - MIC*    AMPICILLIN RESISTANT Resistant     CEFAZOLIN <=4 SENSITIVE Sensitive  CEFTRIAXONE <=1 SENSITIVE Sensitive     CIPROFLOXACIN <=0.25 SENSITIVE Sensitive     GENTAMICIN <=1 SENSITIVE Sensitive     IMIPENEM <=0.25 SENSITIVE Sensitive     NITROFURANTOIN 32 SENSITIVE Sensitive     TRIMETH/SULFA <=20 SENSITIVE Sensitive     AMPICILLIN/SULBACTAM <=2 SENSITIVE Sensitive     PIP/TAZO <=4 SENSITIVE Sensitive     Extended ESBL NEGATIVE Sensitive     * >=100,000 COLONIES/mL KLEBSIELLA PNEUMONIAE  Blood culture (routine x 2)     Status: None (Preliminary result)   Collection Time: 06/16/17  2:42 PM  Result Value Ref Range Status   Specimen Description BLOOD RIGHT AC  Final   Special Requests   Final    BOTTLES DRAWN AEROBIC AND ANAEROBIC Blood Culture adequate volume   Culture NO GROWTH 4 DAYS  Final   Report Status PENDING  Incomplete  Blood culture (routine x 2)     Status: None (Preliminary result)   Collection Time: 06/16/17  2:42 PM  Result Value Ref Range Status   Specimen Description BLOOD LEFT FA  Final   Special Requests   Final    BOTTLES DRAWN AEROBIC AND ANAEROBIC Blood Culture adequate volume   Culture NO GROWTH 4 DAYS  Final   Report Status PENDING  Incomplete  C difficile quick scan w PCR reflex     Status: None   Collection Time: 06/16/17  2:42 PM  Result Value Ref Range Status   C Diff antigen NEGATIVE NEGATIVE Final   C Diff toxin NEGATIVE NEGATIVE Final   C Diff interpretation No C. difficile detected.  Final  Gastrointestinal Panel by PCR , Stool     Status: None   Collection Time: 06/16/17  2:42 PM  Result Value Ref Range Status   Campylobacter  species NOT DETECTED NOT DETECTED Final   Plesimonas shigelloides NOT DETECTED NOT DETECTED Final   Salmonella species NOT DETECTED NOT DETECTED Final   Yersinia enterocolitica NOT DETECTED NOT DETECTED Final   Vibrio species NOT DETECTED NOT DETECTED Final   Vibrio cholerae NOT DETECTED NOT DETECTED Final   Enteroaggregative E coli (EAEC) NOT DETECTED NOT DETECTED Final   Enteropathogenic E coli (EPEC) NOT DETECTED NOT DETECTED Final   Enterotoxigenic E coli (ETEC) NOT DETECTED NOT DETECTED Final   Shiga like toxin producing E coli (STEC) NOT DETECTED NOT DETECTED Final   Shigella/Enteroinvasive E coli (EIEC) NOT DETECTED NOT DETECTED Final   Cryptosporidium NOT DETECTED NOT DETECTED Final   Cyclospora cayetanensis NOT DETECTED NOT DETECTED Final   Entamoeba histolytica NOT DETECTED NOT DETECTED Final   Giardia lamblia NOT DETECTED NOT DETECTED Final   Adenovirus F40/41 NOT DETECTED NOT DETECTED Final   Astrovirus NOT DETECTED NOT DETECTED Final   Norovirus GI/GII NOT DETECTED NOT DETECTED Final   Rotavirus A NOT DETECTED NOT DETECTED Final   Sapovirus (I, II, IV, and V) NOT DETECTED NOT DETECTED Final    RADIOLOGY:  Mr Brain Wo Contrast  Result Date: 06/19/2017 CLINICAL DATA:  Left arm weakness.  Stroke EXAM: MRI HEAD WITHOUT CONTRAST TECHNIQUE: Multiplanar, multiecho pulse sequences of the brain and surrounding structures were obtained without intravenous contrast. COMPARISON:  CT head 06/16/2017 FINDINGS: Brain: Acute infarct in the right pons measuring approximately 8 x 12 mm. No other acute infarct. Moderate chronic microvascular ischemic changes in the white matter and pons and bilateral thalamus. 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 Negative for mass lesion. Ventricle size normal. No shift of the midline  structures. Vascular: Normal arterial flow voids Skull and upper cervical spine: Negative Sinuses/Orbits: Mild mucosal edema in the mastoid sinus  bilaterally and in the ethmoid sinuses bilaterally. No orbital mass. Other: None IMPRESSION: Acute infarct right pons.  Moderate chronic ischemic change. Electronically Signed   By: Franchot Gallo M.D.   On: 06/19/2017 12:47    EKG:   Orders placed or performed during the hospital encounter of 06/16/17  . ED EKG  . ED EKG  . EKG 12-Lead  . EKG 12-Lead      Management plans discussed with the patient, family and they are in agreement.  CODE STATUS:     Code Status Orders        Start     Ordered   06/16/17 1853  Full code  Continuous     06/16/17 1852    Code Status History    Date Active Date Inactive Code Status Order ID Comments User Context   08/16/2016  8:29 PM 08/20/2016  2:25 AM Full Code [id number]  Demetrios Loll, MD Inpatient   10/13/2015 10:10 PM 10/17/2015  6:31 PM Full Code [id number]  Aldean Jewett, MD Inpatient      TOTAL TIME TAKING CARE OF THIS PATIENT: 45 minutes.   Note: This dictation was prepared with Dragon dictation along with smaller phrase technology. Any transcriptional errors that result from this process are unintentional.   @MEC @  on 06/20/2017 at 2:44 PM  Between 7am to 6pm - Pager - [phone]  After 6pm go to [email or web address] - password [email or web address] [facility]  [place] [facility]  Office  [phone]  CC: Primary care physician; Leonel Ramsay, MD

## 2017-06-20 NOTE — Evaluation (Signed)
Clinical/Bedside Swallow Evaluation Patient Details  Name: Brooke Hopkins MRN: [id number] Date of Birth: [birth year]  Today's Date: 06/20/2017 Time: SLP Start Time (ACUTE ONLY): 1250 SLP Stop Time (ACUTE ONLY): 1340 SLP Time Calculation (min) (ACUTE ONLY): 50 min  Past Medical History:  Past Medical History:  Diagnosis Date  . Chronic kidney disease   . Diabetes mellitus   . Hypertension   . Stroke [facility])    Past Surgical History:  Past Surgical History:  Procedure Laterality Date  . ABDOMINAL HYSTERECTOMY    . CHOLECYSTECTOMY    . HAMMER TOE SURGERY    . KNEE ARTHROSCOPY    . LUMBAR DISC SURGERY  11/07/11  . LUMBAR WOUND DEBRIDEMENT  11/27/2011   Procedure: LUMBAR WOUND DEBRIDEMENT;  Surgeon: Eustace Moore;  Location: [facility];  Service: Neurosurgery;  Laterality: N/A;  . TONSILLECTOMY     HPI:  Pt is a 66 y.o. female with a known history of Chronic kidney disease, type 2 diabetes mellitus, hypertension, old CVA presented to the emergency room with generalized weakness. Patient complains of dysuria and foul-smelling urine. She was at a rehabilitation center for more than 2 weeks and has been discharged to home. On friday her legs gave away and she couldn't balance and landed on the floor. During the workup in the emergency room her urine showed infection and she received IV antibiotics. She has a walker, cane and wheelchair at home. Currently, MRI revealed Acute infarct right pons; Moderate chronic ischemic changes. Pt is verbally conversive, following commands, A/O x3. Noted certain speech sounds were less precise than other particularly the "S" sound - pt stated this is "different" from her baseline. Unsure if this is new or baseline such as a slight lisp.    Assessment / Plan / Recommendation Clinical Impression  Pt appears to present w/ an adequate oropharyngeal swallow function w/ no overt oropharyngeal phase deficits noted during oral intake of liquids and solids. Pt  consumed trials of each w/ no overt s/s of aspiration (thin liquids via straw); pt denied any deficits (oral) w/ solids even eating salads/raw veggies w/out full, bottom dentition (this is baseline for her). Pt is able to feed herself; min positioning gave as pt was eating in bed. Recommend continue w/ current regular diet w/ general aspiration precautions; Pills in Puree if easier for swallowing. Of note, pt's speech was 100% intelligible as pt communicated at conversational level w/ SLP and family on phone. There is a slight decrease in precision of certain speech sounds including the "S" sound but unsure if this is completely new or related to a slight lisp baseline. Recommend f/u w/ ST services at Rehab to explore this further if pt so wishes. MD and NSG updated. No further skilled ST services indicated for swallowing at this time. Pt agreed. SLP Visit Diagnosis: Dysphagia, unspecified (R13.10)    Aspiration Risk  No limitations    Diet Recommendation  Regular diet w/ cut, moist foods/meats; Thin liquids. General aspiration precautions  Medication Administration: Whole meds with liquid (or w/ a Puree if easier for swallowing)    Other  Recommendations Recommended Consults:  (n/a) Oral Care Recommendations: Oral care BID;Patient independent with oral care   Follow up Recommendations None (for swallowing)      Frequency and Duration            Prognosis Prognosis for Safe Diet Advancement: Good Barriers to Reach Goals:  (n/a)      Swallow Study   General Date  of Onset: 06/16/17 HPI: Pt is a 66 y.o. female with a known history of Chronic kidney disease, type 2 diabetes mellitus, hypertension, old CVA presented to the emergency room with generalized weakness. Patient complains of dysuria and foul-smelling urine. She was at a rehabilitation center for more than 2 weeks and has been discharged to home. On friday her legs gave away and she couldn't balance and landed on the floor. During the  workup in the emergency room her urine showed infection and she received IV antibiotics. She has a walker, cane and wheelchair at home. Currently, MRI revealed Acute infarct right pons; Moderate chronic ischemic changes. Pt is verbally conversive, following commands, A/O x3. Noted certain speech sounds were less precise than other particularly the "S" sound - pt stated this is "different" from her baseline. Unsure if this is new or baseline such as a slight lisp.  Type of Study: Bedside Swallow Evaluation (requested by MD) Previous Swallow Assessment: none Diet Prior to this Study: Regular;Thin liquids Temperature Spikes Noted: No (wbc 7.1) Respiratory Status: Room air History of Recent Intubation: No Behavior/Cognition: Alert;Cooperative;Pleasant mood Oral Cavity Assessment: Within Functional Limits Oral Care Completed by SLP: Recent completion by staff Oral Cavity - Dentition: Dentures, top;Missing dentition (on bottom - has 2-3 on bottom) Vision: Functional for self-feeding Self-Feeding Abilities: Able to feed self Patient Positioning: Upright in bed (w/ some support w/ pillows) Baseline Vocal Quality: Normal (speech 100% intelligible) Volitional Cough: Strong Volitional Swallow: Able to elicit    Oral/Motor/Sensory Function Overall Oral Motor/Sensory Function: Within functional limits   Ice Chips Ice chips: Not tested   Thin Liquid Thin Liquid: Within functional limits Presentation: Self Fed;Straw (~3-4 ozs total) Other Comments: pt has denied any deficits    Nectar Thick Nectar Thick Liquid: Not tested   Honey Thick Honey Thick Liquid: Not tested   Puree Puree: Within functional limits Presentation: Self Fed;Spoon (4 ozs )   Solid   GO   Solid: Within functional limits Presentation: Self Fed;Spoon (5 trials) Other Comments: pt has denied any deficits even eating salads w/out full bottom dentition (this is baseline for her)          Orinda Kenner, MS,  CCC-SLP Everitt Wenner 06/20/2017,1:52 PM

## 2017-06-21 LAB — HEMOGLOBIN A1C
Hgb A1c MFr Bld: 6.5 % — ABNORMAL HIGH (ref 4.8–5.6)
Mean Plasma Glucose: 140 mg/dL

## 2017-06-21 LAB — CULTURE, BLOOD (ROUTINE X 2)
Culture: NO GROWTH
Culture: NO GROWTH
Special Requests: ADEQUATE
Special Requests: ADEQUATE

## 2020-08-29 ENCOUNTER — Inpatient Hospital Stay
Admission: EM | Admit: 2020-08-29 | Discharge: 2020-09-05 | DRG: 252 | Disposition: A | Discharge status: Home Health | Payer: Medicare HMO | Attending: Family Medicine | Admitting: Family Medicine

## 2020-08-29 ENCOUNTER — Emergency Department: Payer: Medicare HMO

## 2020-08-29 ENCOUNTER — Other Ambulatory Visit: Payer: Self-pay

## 2020-08-29 DIAGNOSIS — I160 Hypertensive urgency: Secondary | ICD-10-CM

## 2020-08-29 DIAGNOSIS — L089 Local infection of the skin and subcutaneous tissue, unspecified: Secondary | ICD-10-CM | POA: Diagnosis present

## 2020-08-29 DIAGNOSIS — L97229 Non-pressure chronic ulcer of left calf with unspecified severity: Secondary | ICD-10-CM | POA: Diagnosis present

## 2020-08-29 DIAGNOSIS — L8990 Pressure ulcer of unspecified site, unspecified stage: Secondary | ICD-10-CM | POA: Diagnosis present

## 2020-08-29 DIAGNOSIS — N179 Acute kidney failure, unspecified: Secondary | ICD-10-CM | POA: Diagnosis present

## 2020-08-29 DIAGNOSIS — Z9119 Patient's noncompliance with other medical treatment and regimen: Secondary | ICD-10-CM

## 2020-08-29 DIAGNOSIS — I70262 Atherosclerosis of native arteries of extremities with gangrene, left leg: Secondary | ICD-10-CM | POA: Diagnosis present

## 2020-08-29 DIAGNOSIS — R778 Other specified abnormalities of plasma proteins: Secondary | ICD-10-CM | POA: Diagnosis present

## 2020-08-29 DIAGNOSIS — R5381 Other malaise: Secondary | ICD-10-CM | POA: Diagnosis present

## 2020-08-29 DIAGNOSIS — E11621 Type 2 diabetes mellitus with foot ulcer: Secondary | ICD-10-CM

## 2020-08-29 DIAGNOSIS — Z794 Long term (current) use of insulin: Secondary | ICD-10-CM

## 2020-08-29 DIAGNOSIS — Z825 Family history of asthma and other chronic lower respiratory diseases: Secondary | ICD-10-CM

## 2020-08-29 DIAGNOSIS — Z833 Family history of diabetes mellitus: Secondary | ICD-10-CM

## 2020-08-29 DIAGNOSIS — Z6835 Body mass index (BMI) 35.0-35.9, adult: Secondary | ICD-10-CM | POA: Diagnosis not present

## 2020-08-29 DIAGNOSIS — I87013 Postthrombotic syndrome with ulcer of bilateral lower extremity: Secondary | ICD-10-CM | POA: Diagnosis present

## 2020-08-29 DIAGNOSIS — I959 Hypotension, unspecified: Secondary | ICD-10-CM | POA: Diagnosis not present

## 2020-08-29 DIAGNOSIS — L8993 Pressure ulcer of unspecified site, stage 3: Secondary | ICD-10-CM | POA: Diagnosis not present

## 2020-08-29 DIAGNOSIS — N189 Chronic kidney disease, unspecified: Secondary | ICD-10-CM | POA: Diagnosis present

## 2020-08-29 DIAGNOSIS — I87012 Postthrombotic syndrome with ulcer of left lower extremity: Secondary | ICD-10-CM | POA: Diagnosis present

## 2020-08-29 DIAGNOSIS — E7800 Pure hypercholesterolemia, unspecified: Secondary | ICD-10-CM | POA: Diagnosis not present

## 2020-08-29 DIAGNOSIS — Z20822 Contact with and (suspected) exposure to covid-19: Secondary | ICD-10-CM | POA: Diagnosis present

## 2020-08-29 DIAGNOSIS — E1152 Type 2 diabetes mellitus with diabetic peripheral angiopathy with gangrene: Principal | ICD-10-CM | POA: Diagnosis present

## 2020-08-29 DIAGNOSIS — E1122 Type 2 diabetes mellitus with diabetic chronic kidney disease: Secondary | ICD-10-CM | POA: Diagnosis present

## 2020-08-29 DIAGNOSIS — Z8673 Personal history of transient ischemic attack (TIA), and cerebral infarction without residual deficits: Secondary | ICD-10-CM

## 2020-08-29 DIAGNOSIS — E876 Hypokalemia: Secondary | ICD-10-CM | POA: Diagnosis present

## 2020-08-29 DIAGNOSIS — L89896 Pressure-induced deep tissue damage of other site: Secondary | ICD-10-CM

## 2020-08-29 DIAGNOSIS — Z79899 Other long term (current) drug therapy: Secondary | ICD-10-CM

## 2020-08-29 DIAGNOSIS — Z7902 Long term (current) use of antithrombotics/antiplatelets: Secondary | ICD-10-CM

## 2020-08-29 DIAGNOSIS — I129 Hypertensive chronic kidney disease with stage 1 through stage 4 chronic kidney disease, or unspecified chronic kidney disease: Secondary | ICD-10-CM | POA: Diagnosis present

## 2020-08-29 DIAGNOSIS — Z7401 Bed confinement status: Secondary | ICD-10-CM | POA: Diagnosis not present

## 2020-08-29 DIAGNOSIS — L03116 Cellulitis of left lower limb: Secondary | ICD-10-CM | POA: Diagnosis present

## 2020-08-29 DIAGNOSIS — L89893 Pressure ulcer of other site, stage 3: Secondary | ICD-10-CM | POA: Diagnosis present

## 2020-08-29 DIAGNOSIS — I10 Essential (primary) hypertension: Secondary | ICD-10-CM

## 2020-08-29 DIAGNOSIS — Z90710 Acquired absence of both cervix and uterus: Secondary | ICD-10-CM | POA: Diagnosis not present

## 2020-08-29 DIAGNOSIS — Z9049 Acquired absence of other specified parts of digestive tract: Secondary | ICD-10-CM

## 2020-08-29 DIAGNOSIS — Z8249 Family history of ischemic heart disease and other diseases of the circulatory system: Secondary | ICD-10-CM

## 2020-08-29 DIAGNOSIS — L97509 Non-pressure chronic ulcer of other part of unspecified foot with unspecified severity: Secondary | ICD-10-CM

## 2020-08-29 DIAGNOSIS — E785 Hyperlipidemia, unspecified: Secondary | ICD-10-CM | POA: Diagnosis present

## 2020-08-29 DIAGNOSIS — T148XXA Other injury of unspecified body region, initial encounter: Secondary | ICD-10-CM

## 2020-08-29 DIAGNOSIS — L89899 Pressure ulcer of other site, unspecified stage: Secondary | ICD-10-CM | POA: Diagnosis not present

## 2020-08-29 DIAGNOSIS — L89529 Pressure ulcer of left ankle, unspecified stage: Secondary | ICD-10-CM | POA: Diagnosis not present

## 2020-08-29 LAB — CBC WITH DIFFERENTIAL/PLATELET
Abs Immature Granulocytes: 0.03 10*3/uL (ref 0.00–0.07)
Basophils Absolute: 0.1 10*3/uL (ref 0.0–0.1)
Basophils Relative: 1 %
Eosinophils Absolute: 0.1 10*3/uL (ref 0.0–0.5)
Eosinophils Relative: 1 %
HCT: 37.1 % (ref 36.0–46.0)
Hemoglobin: 12.4 g/dL (ref 12.0–15.0)
Immature Granulocytes: 0 %
Lymphocytes Relative: 21 %
Lymphs Abs: 2 10*3/uL (ref 0.7–4.0)
MCH: 27.5 pg (ref 26.0–34.0)
MCHC: 33.4 g/dL (ref 30.0–36.0)
MCV: 82.3 fL (ref 80.0–100.0)
Monocytes Absolute: 0.4 10*3/uL (ref 0.1–1.0)
Monocytes Relative: 4 %
Neutro Abs: 6.7 10*3/uL (ref 1.7–7.7)
Neutrophils Relative %: 73 %
Platelets: 385 10*3/uL (ref 150–400)
RBC: 4.51 MIL/uL (ref 3.87–5.11)
RDW: 13.4 % (ref 11.5–15.5)
WBC: 9.3 10*3/uL (ref 4.0–10.5)
nRBC: 0 % (ref 0.0–0.2)

## 2020-08-29 LAB — COMPREHENSIVE METABOLIC PANEL
ALT: 16 U/L (ref 0–44)
AST: 17 U/L (ref 15–41)
Albumin: 3.3 g/dL — ABNORMAL LOW (ref 3.5–5.0)
Alkaline Phosphatase: 63 U/L (ref 38–126)
Anion gap: 13 (ref 5–15)
BUN: 18 mg/dL (ref 8–23)
CO2: 25 mmol/L (ref 22–32)
Calcium: 8.8 mg/dL — ABNORMAL LOW (ref 8.9–10.3)
Chloride: 105 mmol/L (ref 98–111)
Creatinine, Ser: 1.21 mg/dL — ABNORMAL HIGH (ref 0.44–1.00)
GFR calc Af Amer: 52 mL/min — ABNORMAL LOW (ref >60)
GFR calc non Af Amer: 45 mL/min — ABNORMAL LOW (ref >60)
Glucose, Bld: 112 mg/dL — ABNORMAL HIGH (ref 70–99)
Potassium: 3.8 mmol/L (ref 3.5–5.1)
Sodium: 143 mmol/L (ref 135–145)
Total Bilirubin: 0.8 mg/dL (ref 0.3–1.2)
Total Protein: 7.4 g/dL (ref 6.5–8.1)

## 2020-08-29 LAB — GLUCOSE, CAPILLARY: Glucose-Capillary: 102 mg/dL — ABNORMAL HIGH (ref 70–99)

## 2020-08-29 LAB — CK: Total CK: 28 U/L — ABNORMAL LOW (ref 38–234)

## 2020-08-29 LAB — TROPONIN I (HIGH SENSITIVITY)
Troponin I (High Sensitivity): 75 ng/L — ABNORMAL HIGH (ref <18)
Troponin I (High Sensitivity): 62 ng/L — ABNORMAL HIGH (ref <18)

## 2020-08-29 LAB — LACTIC ACID, PLASMA: Lactic Acid, Venous: 1.8 mmol/L (ref 0.5–1.9)

## 2020-08-29 LAB — SARS CORONAVIRUS 2 BY RT PCR (HOSPITAL ORDER, PERFORMED IN ~~LOC~~ HOSPITAL LAB): SARS Coronavirus 2: NEGATIVE

## 2020-08-29 LAB — SEDIMENTATION RATE: Sed Rate: 47 mm/hr — ABNORMAL HIGH (ref 0–30)

## 2020-08-29 MED ORDER — BISACODYL 5 MG PO TBEC: 5.0000 mg | DELAYED_RELEASE_TABLET | Freq: Every day | ORAL | Status: DC | PRN

## 2020-08-29 MED ORDER — VANCOMYCIN HCL 2000 MG/400ML IV SOLN
2000.0000 mg | Freq: Once | INTRAVENOUS | Status: AC
  Administered 2020-08-29: 2000 mg via INTRAVENOUS
  Filled 2020-08-29: qty 400

## 2020-08-29 MED ORDER — HYDROCODONE-ACETAMINOPHEN 5-325 MG PO TABS
1.0000 | ORAL_TABLET | ORAL | Status: DC | PRN
  Administered 2020-08-29: 1 via ORAL
  Administered 2020-08-30: 2 via ORAL
  Administered 2020-08-31 (×2): 1 via ORAL
  Administered 2020-09-02 – 2020-09-05 (×6): 2 via ORAL
  Filled 2020-08-29: qty 1
  Filled 2020-08-29 (×7): qty 2
  Filled 2020-08-29 (×2): qty 1

## 2020-08-29 MED ORDER — LISINOPRIL 20 MG PO TABS
40.0000 mg | ORAL_TABLET | Freq: Every day | ORAL | Status: DC
  Administered 2020-08-30 – 2020-08-31 (×2): 40 mg via ORAL
  Filled 2020-08-29: qty 4
  Filled 2020-08-29: qty 2

## 2020-08-29 MED ORDER — VANCOMYCIN HCL IN DEXTROSE 1-5 GM/200ML-% IV SOLN
1000.0000 mg | INTRAVENOUS | Status: DC
  Filled 2020-08-29: qty 200

## 2020-08-29 MED ORDER — HYDRALAZINE HCL 50 MG PO TABS
25.0000 mg | ORAL_TABLET | Freq: Once | ORAL | Status: AC
  Administered 2020-08-29: 25 mg via ORAL
  Filled 2020-08-29: qty 1

## 2020-08-29 MED ORDER — CLONIDINE HCL 0.2 MG/24HR TD PTWK
0.2000 mg | MEDICATED_PATCH | TRANSDERMAL | Status: DC
  Administered 2020-08-29: 0.2 mg via TRANSDERMAL
  Filled 2020-08-29: qty 1

## 2020-08-29 MED ORDER — ONDANSETRON HCL 4 MG PO TABS: 4.0000 mg | ORAL_TABLET | Freq: Four times a day (QID) | ORAL | Status: DC | PRN

## 2020-08-29 MED ORDER — ONDANSETRON HCL 4 MG/2ML IJ SOLN
4.0000 mg | Freq: Once | INTRAMUSCULAR | Status: AC
  Administered 2020-08-29: 4 mg via INTRAVENOUS
  Filled 2020-08-29: qty 2

## 2020-08-29 MED ORDER — PIPERACILLIN-TAZOBACTAM 3.375 G IVPB
3.3750 g | Freq: Three times a day (TID) | INTRAVENOUS | Status: AC
  Administered 2020-08-29 – 2020-09-01 (×9): 3.375 g via INTRAVENOUS
  Filled 2020-08-29 (×10): qty 50

## 2020-08-29 MED ORDER — HYDRALAZINE HCL 10 MG PO TABS: 10.0000 mg | ORAL_TABLET | Freq: Once | ORAL | Status: DC

## 2020-08-29 MED ORDER — ATORVASTATIN CALCIUM 20 MG PO TABS
80.0000 mg | ORAL_TABLET | Freq: Every day | ORAL | Status: DC
  Administered 2020-08-29 – 2020-09-04 (×6): 80 mg via ORAL
  Filled 2020-08-29 (×6): qty 4

## 2020-08-29 MED ORDER — MORPHINE SULFATE (PF) 4 MG/ML IV SOLN
4.0000 mg | Freq: Once | INTRAVENOUS | Status: AC
  Administered 2020-08-29: 4 mg via INTRAVENOUS
  Filled 2020-08-29: qty 1

## 2020-08-29 MED ORDER — INSULIN ASPART 100 UNIT/ML ~~LOC~~ SOLN
0.0000 [IU] | Freq: Three times a day (TID) | SUBCUTANEOUS | Status: DC
  Administered 2020-08-30 – 2020-08-31 (×2): 3 [IU] via SUBCUTANEOUS
  Administered 2020-08-31: 4 [IU] via SUBCUTANEOUS
  Administered 2020-08-31 – 2020-09-01 (×2): 3 [IU] via SUBCUTANEOUS
  Administered 2020-09-02 (×2): 4 [IU] via SUBCUTANEOUS
  Administered 2020-09-02 – 2020-09-03 (×2): 3 [IU] via SUBCUTANEOUS
  Administered 2020-09-03: 4 [IU] via SUBCUTANEOUS
  Administered 2020-09-03: 3 [IU] via SUBCUTANEOUS
  Administered 2020-09-04: 4 [IU] via SUBCUTANEOUS
  Administered 2020-09-04 – 2020-09-05 (×3): 3 [IU] via SUBCUTANEOUS
  Administered 2020-09-05: 4 [IU] via SUBCUTANEOUS
  Filled 2020-08-29 (×16): qty 1

## 2020-08-29 MED ORDER — ENOXAPARIN SODIUM 40 MG/0.4ML ~~LOC~~ SOLN
40.0000 mg | SUBCUTANEOUS | Status: DC
  Administered 2020-08-30 – 2020-09-04 (×7): 40 mg via SUBCUTANEOUS
  Filled 2020-08-29 (×7): qty 0.4

## 2020-08-29 MED ORDER — SODIUM CHLORIDE 0.9 % IV SOLN
250.0000 mL | INTRAVENOUS | Status: DC | PRN
  Administered 2020-08-30: 250 mL via INTRAVENOUS

## 2020-08-29 MED ORDER — POLYETHYLENE GLYCOL 3350 17 G PO PACK
17.0000 g | PACK | Freq: Every day | ORAL | Status: DC | PRN
  Administered 2020-09-01: 17 g via ORAL
  Filled 2020-08-29: qty 1

## 2020-08-29 MED ORDER — LISINOPRIL 10 MG PO TABS
40.0000 mg | ORAL_TABLET | Freq: Once | ORAL | Status: AC
  Administered 2020-08-29: 40 mg via ORAL
  Filled 2020-08-29: qty 4

## 2020-08-29 MED ORDER — SODIUM CHLORIDE 0.9% FLUSH
3.0000 mL | Freq: Two times a day (BID) | INTRAVENOUS | Status: DC
  Administered 2020-08-30 – 2020-09-03 (×3): 3 mL via INTRAVENOUS

## 2020-08-29 MED ORDER — ONDANSETRON HCL 4 MG/2ML IJ SOLN
4.0000 mg | Freq: Four times a day (QID) | INTRAMUSCULAR | Status: DC | PRN
  Administered 2020-08-31 – 2020-09-04 (×2): 4 mg via INTRAVENOUS
  Filled 2020-08-29 (×2): qty 2

## 2020-08-29 MED ORDER — HYDRALAZINE HCL 25 MG PO TABS
25.0000 mg | ORAL_TABLET | Freq: Three times a day (TID) | ORAL | Status: DC
  Administered 2020-08-30 – 2020-09-02 (×10): 25 mg via ORAL
  Filled 2020-08-29 (×10): qty 1

## 2020-08-29 MED ORDER — CLOPIDOGREL BISULFATE 75 MG PO TABS
75.0000 mg | ORAL_TABLET | Freq: Every day | ORAL | Status: DC
  Administered 2020-08-29 – 2020-09-05 (×7): 75 mg via ORAL
  Filled 2020-08-29 (×7): qty 1

## 2020-08-29 MED ORDER — SODIUM CHLORIDE 0.9% FLUSH: 3.0000 mL | INTRAVENOUS | Status: DC | PRN

## 2020-08-29 NOTE — H&P (Addendum)
History and Physical:    Brooke Hopkins   PJA:[id number] DOB: 07-22-1951 DOA: 08/29/2020  Referring MD/provider: Dr. Rip Harbour PCP: Leonel Ramsay, MD   Patient coming from: Home  Chief Complaint: Actively expanding ulcer on left leg.  History of Present Illness:   Brooke Hopkins is an 69 y.o. female with PMH significant for HTN, DM 2, cerebrovascular disease status post CVA and CKD who states she was in her usual state of relatively poor health, she is bedbound and dependent on [employer] lift since her stroke, until 2 weeks ago when she developed a quarter shaped ulcer in the back of her left calf.  Patient was seen by her PCP who prescribed DuoDERM patches which her husband has been applying.  Patient and husband both note that the ulcer has grown daily and now is so large that it encompasses most of her calf.  They note the ulcer has been growing extremely rapidly.  It is now foul-smelling in a way that it had not been before.  Patient herself denies any fevers or chills or malaise.  Other than the rapidly growing ulcer and the pain associated with the ulcer, she otherwise feels her usual self.  As noted above she is bedbound and is unable to move her leg since her stroke.  Patient admits she has not taken her blood pressure medications in a while because of changing insurance.  Notes that her blood pressure is usually well controlled in the 130s as long as she takes her medications.  Admits she has not taken her BP meds in a few days.  ED Course:  The patient was to be afebrile.  She was extremely hypertensive at 256/111.  She was noted to have a large foul-smelling ulcer.  Laboratory data were notable for no leukocytosis with a white count of 9.3 and a lactic acid of 1.8, anion gap 13.  Blood sugar was reasonably controlled at 112.  Plain film showed no evidence of osteomyelitis and sed rate was 47.  General surgery was consulted who noted she would need debridement however  they were concerned that she may not have enough vascular flow to heal an extensive debridement.  Full consult is to follow.  ROS:   ROS   Review of Systems: General: Denies fever, chills, malaise,  Respiratory: Denies cough, SOB at rest or hemoptysis Cardiovascular: Denies chest pain or palpitations GI: Denies nausea, vomiting, diarrhea or constipation GU: Denies dysuria, frequency or hematuria   Past Medical History:   Past Medical History:  Diagnosis Date  . Chronic kidney disease   . Diabetes mellitus   . Hypertension   . Stroke [facility])     Past Surgical History:   Past Surgical History:  Procedure Laterality Date  . ABDOMINAL HYSTERECTOMY    . CHOLECYSTECTOMY    . HAMMER TOE SURGERY    . KNEE ARTHROSCOPY    . LUMBAR DISC SURGERY  11/07/11  . LUMBAR WOUND DEBRIDEMENT  11/27/2011   Procedure: LUMBAR WOUND DEBRIDEMENT;  Surgeon: Eustace Moore;  Location: [facility];  Service: Neurosurgery;  Laterality: N/A;  . TONSILLECTOMY      Social History:   Social History   Socioeconomic History  . Marital status: Married    Spouse name: Not on file  . Number of children: Not on file  . Years of education: Not on file  . Highest education level: Not on file  Occupational History  . Not on file  Tobacco Use  .  Smoking status: Never Smoker  . Smokeless tobacco: Never Used  Vaping Use  . Vaping Use: Never used  Substance and Sexual Activity  . Alcohol use: No  . Drug use: No  . Sexual activity: Yes    Birth control/protection: Post-menopausal  Other Topics Concern  . Not on file  Social History Narrative  . Not on file   Social Determinants of Health   Financial Resource Strain:   . Difficulty of Paying Living Expenses: Not on file  Food Insecurity:   . Worried About Charity fundraiser in the Last Year: Not on file  . Ran Out of Food in the Last Year: Not on file  Transportation Needs:   . Lack of Transportation (Medical): Not on file  . Lack of  Transportation (Non-Medical): Not on file  Physical Activity:   . Days of Exercise per Week: Not on file  . Minutes of Exercise per Session: Not on file  Stress:   . Feeling of Stress : Not on file  Social Connections:   . Frequency of Communication with Friends and Family: Not on file  . Frequency of Social Gatherings with Friends and Family: Not on file  . Attends Religious Services: Not on file  . Active Member of Clubs or Organizations: Not on file  . Attends Archivist Meetings: Not on file  . Marital Status: Not on file  Intimate Partner Violence:   . Fear of Current or Ex-Partner: Not on file  . Emotionally Abused: Not on file  . Physically Abused: Not on file  . Sexually Abused: Not on file    Allergies   Patient has no known allergies.  Family history:   Family History  Problem Relation Age of Onset  . CAD Mother   . Hypertension Mother   . COPD Father   . Diabetes Sister   . Hypertension Sister     Current Medications:   Prior to Admission medications   Medication Sig Start Date End Date Taking? Authorizing Provider  atorvastatin (LIPITOR) 80 MG tablet Take 1 tablet (80 mg total) by mouth daily at 6 PM. 10/17/15  Yes Gouru, Aruna, MD  cloNIDine (CATAPRES - DOSED IN MG/24 HR) 0.2 mg/24hr patch Place 1 patch (0.2 mg total) onto the skin once a week. 10/17/15  Yes Gouru, Illene Silver, MD  clopidogrel (PLAVIX) 75 MG tablet Take 1 tablet by mouth daily. 12/25/17  Yes [provider]  hydrALAZINE (APRESOLINE) 25 MG tablet Take 1 tablet (25 mg total) by mouth every 8 (eight) hours. 06/21/17  Yes Gouru, Illene Silver, MD  ibuprofen (ADVIL) 200 MG tablet Take 600 mg by mouth every 6 (six) hours as needed.   Yes [provider]  insulin isophane & regular human (HUMULIN 70/30 KWIKPEN) (70-30) 100 UNIT/ML KwikPen Inject 40 Units into the skin 2 (two) times daily. 11/18/17  Yes [provider]  lisinopril (ZESTRIL) 40 MG tablet Take 1 tablet by mouth  daily. 11/05/17  Yes [provider]  acetaminophen (TYLENOL) 325 MG tablet Take 2 tablets (650 mg total) by mouth every 6 (six) hours as needed for mild pain (or Fever >/= 101). Patient not taking: Reported on 08/29/2020 06/20/17   Nicholes Mango, MD  cephALEXin (KEFLEX) 500 MG capsule Take 1 capsule (500 mg total) by mouth 2 (two) times daily. Patient not taking: Reported on 08/29/2020 06/20/17   Nicholes Mango, MD  collagenase (SANTYL) ointment Apply topically daily. Patient not taking: Reported on 04/30/2017 10/17/15   Gouru,  Illene Silver, MD  insulin aspart (NOVOLOG) 100 UNIT/ML injection Inject 0-15 Units into the skin 3 (three) times daily with meals. Patient not taking: Reported on 08/29/2020 06/20/17   Nicholes Mango, MD  insulin lispro (HUMALOG) 100 UNIT/ML injection Inject 0-0.05 mLs (0-5 Units total) into the skin at bedtime. Patient not taking: Reported on 08/29/2020 06/20/17   Nicholes Mango, MD  ondansetron (ZOFRAN) 4 MG tablet Take 1 tablet (4 mg total) by mouth every 6 (six) hours as needed for nausea. Patient not taking: Reported on 08/29/2020 06/20/17   Nicholes Mango, MD  oxyCODONE (ROXICODONE) 5 MG immediate release tablet Take 1 tablet (5 mg total) by mouth every 8 (eight) hours as needed for moderate pain. Patient not taking: Reported on 08/29/2020 06/20/17   Nicholes Mango, MD    Physical Exam:   Vitals:   08/29/20 1500 08/29/20 1530 08/29/20 1545 08/29/20 1600  BP: (!) 202/68 (!) 201/61  (!) 175/72  Pulse: 79 77 79 76  Resp: (!) 24 (!) 24 18 17   Temp:      TempSrc:      SpO2: 95% 93% 96% 100%  Weight:      Height:         Physical Exam: Blood pressure (!) 175/72, pulse 76, temperature 98.3 F (36.8 C), temperature source Oral, resp. rate 17, height 5\' 6"  (1.676 m), weight 99.8 kg, SpO2 100 %. Gen: Obese female lying flat in bed with left leg propped up on pillow. Eyes: sclera anicteric, conjuctiva mildly injected bilaterally CVS: S1-S2, regulary, no gallops Respiratory:   decreased air entry likely secondary to decreased inspiratory effort GI: NABS, soft, NT  LE: Patient with large grade 3 ulcer extending along left calf.  Photograph can be seen on the general surgery note.  It is black and seems to have superficial necrosis.  There is some surrounding erythema but no streaking suggestive of full-fledged cellulitis.  She does have tenderness to touch distally at her feet which of 3-4+ edema. Neuro: A/O x 3, Moving all extremities equally with normal strength, CN 3-12 intact, grossly nonfocal.  Psych: patient is logical and coherent, judgement and insight appear normal, mood and affect appropriate to situation.   Data Review:    Labs: Basic Metabolic Panel: Recent Labs  Lab 08/29/20 1125  NA 143  K 3.8  CL 105  CO2 25  GLUCOSE 112*  BUN 18  CREATININE 1.21*  CALCIUM 8.8*   Liver Function Tests: Recent Labs  Lab 08/29/20 1125  AST 17  ALT 16  ALKPHOS 63  BILITOT 0.8  PROT 7.4  ALBUMIN 3.3*   No results for input(s): LIPASE, AMYLASE in the last 168 hours. No results for input(s): AMMONIA in the last 168 hours. CBC: Recent Labs  Lab 08/29/20 1125  WBC 9.3  NEUTROABS 6.7  HGB 12.4  HCT 37.1  MCV 82.3  PLT 385   Cardiac Enzymes: Recent Labs  Lab 08/29/20 1125  CKTOTAL 28*    BNP (last 3 results) No results for input(s): PROBNP in the last 8760 hours. CBG: No results for input(s): GLUCAP in the last 168 hours.  Urinalysis    Component Value Date/Time   COLORURINE YELLOW (A) 06/16/2017 1429   APPEARANCEUR CLOUDY (A) 06/16/2017 1429   LABSPEC 1.017 06/16/2017 1429   PHURINE 5.0 06/16/2017 1429   GLUCOSEU NEGATIVE 06/16/2017 1429   HGBUR SMALL (A) 06/16/2017 1429   BILIRUBINUR NEGATIVE 06/16/2017 1429   KETONESUR NEGATIVE 06/16/2017 1429   PROTEINUR 100 (A) 06/16/2017 1429  NITRITE NEGATIVE 06/16/2017 1429   LEUKOCYTESUR LARGE (A) 06/16/2017 1429      Radiographic Studies: DG Tibia/Fibula Left Port  Result Date:  08/29/2020 CLINICAL DATA:  Foot wound infection of the anterior distal tibia-fibula, left heel, and left calf EXAM: LEFT FOOT - 2 VIEW; PORTABLE LEFT TIBIA AND FIBULA - 2 VIEW COMPARISON:  X-ray 10/13/2015 FINDINGS: Bones are markedly demineralized. There is a chronic appearing fracture deformity of the distal tibial metaphysis with varus angulation. 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 No knee joint effusion. No acute fracture is identified. No evidence of cortical destruction or periostitis to suggest osteomyelitis. Soft tissue ulcerations at the posterolateral aspect of the calf and plantar heel. Marked soft tissue swelling of the foot. Severe extensive atherosclerotic calcifications. IMPRESSION: 1. Soft tissue ulcerations of the calf and plantar heel. No radiographic evidence of osteomyelitis. 2. 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 3. Marked soft tissue swelling of the foot. 4. Chronic appearing fracture deformity of the distal tibial metaphysis with varus angulation. Electronically Signed   By: Davina Poke D.O.   On: 08/29/2020 13:16   DG Foot 2 Views Left  Result Date: 08/29/2020 CLINICAL DATA:  Foot wound infection of the anterior distal tibia-fibula, left heel, and left calf EXAM: LEFT FOOT - 2 VIEW; PORTABLE LEFT TIBIA AND FIBULA - 2 VIEW COMPARISON:  X-ray 10/13/2015 FINDINGS: Bones are markedly demineralized. There is a chronic appearing fracture deformity of the distal tibial metaphysis with varus angulation. 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 No knee joint effusion. No acute fracture is identified. No evidence of cortical destruction or periostitis to suggest osteomyelitis. Soft tissue ulcerations at the posterolateral aspect of the calf and plantar heel. Marked soft tissue swelling of the foot. Severe extensive atherosclerotic calcifications. IMPRESSION: 1. Soft tissue ulcerations of the calf and plantar heel. No  radiographic evidence of osteomyelitis. 2. 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 3. Marked soft tissue swelling of the foot. 4. Chronic appearing fracture deformity of the distal tibial metaphysis with varus angulation. Electronically Signed   By: Davina Poke D.O.   On: 08/29/2020 13:16    EKG: Independently reviewed.  Poor baseline.  NSR at 90.  I CVD nonspecific.  No acute ST-T wave changes.   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 Await full general surgery consult regarding plans for debridement vs need for vascular consult to assess blood flow  Erythema surrounding decubitus ulcer Patient does have erythema surrounding the ulcer and there is a foul smell although no obvious purulence Given afebrile state and no real leukocytosis, I am not sure whether this is superinfected or not Will provide for short course of empiric therapy however would consider early cessation if warranted. Will start vancomycin and Zosyn in this diabetic given likely polymicrobial infection, assuming she does have an infection.  Elevated troponin Unclear etiology, possible muscle necrosis in her calf EKG without ischemic changes, patient denies any chest pain Will repeat troponin in the morning, telemetry  can be discontinued at that time if stable or trending down  Pain control Patient states she has been using ibuprofen for pain control Will start prn hydrocodone, have warned patient about  constipation and to pay attention to her bowel regimen  HTN Patient admitted with hypertensive urgency without evidence of endorgan damage She was treated with reinstatement of her usual oral medications including hydralazine and lisinopril Blood pressure has decreased but is still elevated Continue lisinopril, hydralazine and reapply Catapres patch  DM2 Carb controlled diet with resistant dose SSI  ACHS    Other information:   DVT prophylaxis: Enoxaparin ordered. Code Status: Full Family Communication: Patient's husband was at bedside Disposition Plan: TBD Consults called: General surgery Admission status: Inpatient  Jeronica Stlouis Tublu Divya Munshi [facility]  If 7PM-7AM, please contact night-coverage [email or web address] Password [facility] 08/29/2020, 4:30 PM

## 2020-08-29 NOTE — Consult Note (Signed)
Pharmacy Antibiotic Note  Brooke Hopkins is a 69 y.o. female with medical history including HTN, T2DM, hx CVA, and CKD admitted on 08/29/2020 with expanding ulcer on back of left calf.  Pharmacy has been consulted for vancomycin + Zosyn dosing. Per consult instructions, duration of therapy is defined at 3 days.  Plan: Zosyn 3.375g IV q8h (4 hour infusion).   Vancomycin 2 g IV LD x 1 followed by maintenance regimen of 1000 mg q24h  Daily Scr per protocol. Monitor closely for nephrotoxicity with vancomycin + Zosyn combination. Do not anticipate checking levels unless planned duration of therapy is extended.  Height: 5\' 6"  (167.6 cm) Weight: 99.8 kg (220 lb) IBW/kg (Calculated) : 59.3  Temp (24hrs), Avg:98.3 F (36.8 C), Min:98.3 F (36.8 C), Max:98.3 F (36.8 C)  Recent Labs  Lab 08/29/20 1125  WBC 9.3  CREATININE 1.21*  LATICACIDVEN 1.8    Estimated Creatinine Clearance: 51.6 mL/min (A) (by C-G formula based on SCr of 1.21 mg/dL (H)).    No Known Allergies  Antimicrobials this admission: Zosyn 8/31 >>  Vancomycin 8/31 >>   Dose adjustments this admission: n/a  Microbiology results: 8/31 BCx: pending  Thank you for allowing pharmacy to be a part of this patient's care.  Benita Gutter 08/29/2020 5:04 PM

## 2020-08-29 NOTE — ED Notes (Signed)
2 sets of blood cultures sent to lab.

## 2020-08-29 NOTE — ED Notes (Signed)
Pt given food tray.

## 2020-08-29 NOTE — ED Triage Notes (Signed)
Pt to ED via [facility] from. Pt stating 2wks ago she noticed a quarter size wound on her LLE. Wound has progressed and pt was sent over by primary care for wound eval. Husband has been caring for wound at home but was not on any antibiotics. Pt bed bound with lift assist at home. Pt has not had morning medication.   Pt with hx DM, HTN and stroke

## 2020-08-29 NOTE — Progress Notes (Signed)
Brief Progress Note In brief, patient with left lower extremity wound which have been worsening over the last few weeks. She had been trying neosporin and pressure offloading at home but these have continued to worsen. On exam, she has a chronic appearing large necrotic wound to the lateral aspect as well as chronic venous stasis changes and difficulty palpating DP/PT pulses. XR concerning for ulceration and significant atherosclerotic disease.   Left Lateral Leg Wound:    Plan:  -- Agree with medicine admission -- IV Abx -- I do think she will warrant debridement. My concern would be underlying vascular/aterial disease that is exacerbating this process and would interfere. I would recommend evaluation from vascular surgery for the above. If they do not feel she has any underlying disease, we will be happy to debride this. No evidence of necrotizing infection at the moment -- Pain control prn  Full consult to follow  -- Edison Simon, PA-C [facility] 08/29/2020, 3:56 PM [phone] M-F: 7am - 4pm

## 2020-08-29 NOTE — ED Notes (Signed)
Pt blood sugar was 102

## 2020-08-29 NOTE — Consult Note (Addendum)
[facility] SURGICAL CONSULTATION NOTE (initial) - cpt: 64403 (Outpatient/ED)   HISTORY OF PRESENT ILLNESS (HPI):  69 y.o. female presented to [facility] ED today for evaluation of left lower extremity wounds. Patient reports a two week history of worsening LLE wound to her lateral left lower leg and on the dorsal aspect of her foot. These started out as small lesion and her and her husband were trying neosporin and superficial dressings without any improvement. Patient is non-mobile secondary to a history of stroke. These wounds have now become severe and foul smelling which prompted her visit. She denied any associated fever, chills, CP, SOB, nausea, emesis, urinary changes, or bowel changes. No trauma or injury prior to the onset of these wounds. No history of similar prior to this. Work up in the ED revealed a normal CBC without leukocytosis, CMP showed a mild AKI with sCr - 1.21, ESR with expected mild elevation, and BCx are still in process. Imaging was again concerning for these left lower extremity ulcerations without evidence of osteomyelitis however she did have appreciable atherosclerotic disease on XR.   Surgery is consulted by emergency medicine physician Dr. Conni Slipper, MD in this context for evaluation and management of left lower extremity wounds.  PAST MEDICAL HISTORY (PMH):  Past Medical History:  Diagnosis Date  . Chronic kidney disease   . Diabetes mellitus   . Hypertension   . Stroke [facility])      PAST SURGICAL HISTORY ([place]):  Past Surgical History:  Procedure Laterality Date  . ABDOMINAL HYSTERECTOMY    . CHOLECYSTECTOMY    . HAMMER TOE SURGERY    . KNEE ARTHROSCOPY    . LUMBAR DISC SURGERY  11/07/11  . LUMBAR WOUND DEBRIDEMENT  11/27/2011   Procedure: LUMBAR WOUND DEBRIDEMENT;  Surgeon: Eustace Moore;  Location: [place] [facility];  Service: Neurosurgery;  Laterality: N/A;  . TONSILLECTOMY       MEDICATIONS:  Prior to Admission medications   Medication Sig  Start Date End Date Taking? Authorizing Provider  atorvastatin (LIPITOR) 80 MG tablet Take 1 tablet (80 mg total) by mouth daily at 6 PM. 10/17/15  Yes Gouru, Aruna, MD  cloNIDine (CATAPRES - DOSED IN MG/24 HR) 0.2 mg/24hr patch Place 1 patch (0.2 mg total) onto the skin once a week. 10/17/15  Yes Gouru, Illene Silver, MD  clopidogrel (PLAVIX) 75 MG tablet Take 1 tablet by mouth daily. 12/25/17  Yes [provider]  hydrALAZINE (APRESOLINE) 25 MG tablet Take 1 tablet (25 mg total) by mouth every 8 (eight) hours. 06/21/17  Yes Gouru, Illene Silver, MD  ibuprofen (ADVIL) 200 MG tablet Take 600 mg by mouth every 6 (six) hours as needed.   Yes [provider]  insulin isophane & regular human (HUMULIN 70/30 KWIKPEN) (70-30) 100 UNIT/ML KwikPen Inject 40 Units into the skin 2 (two) times daily. 11/18/17  Yes [provider]  lisinopril (ZESTRIL) 40 MG tablet Take 1 tablet by mouth daily. 11/05/17  Yes [provider]  acetaminophen (TYLENOL) 325 MG tablet Take 2 tablets (650 mg total) by mouth every 6 (six) hours as needed for mild pain (or Fever >/= 101). Patient not taking: Reported on 08/29/2020 06/20/17   Nicholes Mango, MD  cephALEXin (KEFLEX) 500 MG capsule Take 1 capsule (500 mg total) by mouth 2 (two) times daily. Patient not taking: Reported on 08/29/2020 06/20/17   Nicholes Mango, MD  collagenase (SANTYL) ointment Apply topically daily. Patient not taking: Reported on 04/30/2017 10/17/15   Nicholes Mango, MD  insulin aspart (NOVOLOG) 100 UNIT/ML injection Inject 0-15 Units into the skin 3 (three) times daily with meals. Patient not taking: Reported on 08/29/2020 06/20/17   Nicholes Mango, MD  insulin lispro (HUMALOG) 100 UNIT/ML injection Inject 0-0.05 mLs (0-5 Units total) into the skin at bedtime. Patient not taking: Reported on 08/29/2020 06/20/17   Nicholes Mango, MD  ondansetron (ZOFRAN) 4 MG tablet Take 1 tablet (4 mg total) by mouth every 6 (six) hours as needed for nausea. Patient not  taking: Reported on 08/29/2020 06/20/17   Nicholes Mango, MD  oxyCODONE (ROXICODONE) 5 MG immediate release tablet Take 1 tablet (5 mg total) by mouth every 8 (eight) hours as needed for moderate pain. Patient not taking: Reported on 08/29/2020 06/20/17   Nicholes Mango, MD     ALLERGIES:  No Known Allergies   SOCIAL HISTORY:  Social History   Socioeconomic History  . Marital status: Married    Spouse name: Not on file  . Number of children: Not on file  . Years of education: Not on file  . Highest education level: Not on file  Occupational History  . Not on file  Tobacco Use  . Smoking status: Never Smoker  . Smokeless tobacco: Never Used  Vaping Use  . Vaping Use: Never used  Substance and Sexual Activity  . Alcohol use: No  . Drug use: No  . Sexual activity: Yes    Birth control/protection: Post-menopausal  Other Topics Concern  . Not on file  Social History Narrative  . Not on file   Social Determinants of Health   Financial Resource Strain:   . Difficulty of Paying Living Expenses: Not on file  Food Insecurity:   . Worried About Charity fundraiser in the Last Year: Not on file  . Ran Out of Food in the Last Year: Not on file  Transportation Needs:   . Lack of Transportation (Medical): Not on file  . Lack of Transportation (Non-Medical): Not on file  Physical Activity:   . Days of Exercise per Week: Not on file  . Minutes of Exercise per Session: Not on file  Stress:   . Feeling of Stress : Not on file  Social Connections:   . Frequency of Communication with Friends and Family: Not on file  . Frequency of Social Gatherings with Friends and Family: Not on file  . Attends Religious Services: Not on file  . Active Member of Clubs or Organizations: Not on file  . Attends Archivist Meetings: Not on file  . Marital Status: Not on file  Intimate Partner Violence:   . Fear of Current or Ex-Partner: Not on file  . Emotionally Abused: Not on file  . Physically  Abused: Not on file  . Sexually Abused: Not on file     FAMILY HISTORY:  Family History  Problem Relation Age of Onset  . CAD Mother   . Hypertension Mother   . COPD Father   . Diabetes Sister   . Hypertension Sister       REVIEW OF SYSTEMS:  Review of Systems  Constitutional: Negative for chills and fever.  Respiratory: Negative for cough and shortness of breath.   Cardiovascular: Negative for chest pain and palpitations.  Gastrointestinal: Negative for abdominal pain, nausea and vomiting.  Skin:       + LLE wounds  Neurological: Positive for weakness (Chronic 2/2 stroke).  All other systems reviewed and are negative.   VITAL SIGNS:  Temp:  [98.3  F (36.8 C)] 98.3 F (36.8 C) (08/31 1117) Pulse Rate:  [79-99] 79 (08/31 1500) Resp:  [16-24] 24 (08/31 1500) BP: (173-256)/(68-111) 202/68 (08/31 1500) SpO2:  [95 %-100 %] 95 % (08/31 1500) Weight:  [99.8 kg] 99.8 kg (08/31 1118)     Height: 5' 6"  (167.6 cm) Weight: 99.8 kg BMI (Calculated): 35.53   INTAKE/OUTPUT:  No intake/output data recorded.  PHYSICAL EXAM:  Physical Exam Vitals and nursing note reviewed. Exam conducted with a chaperone present.  Constitutional:      General: She is not in acute distress.    Appearance: Normal appearance. She is obese. She is not ill-appearing.  HENT:     Head: Normocephalic and atraumatic.     Mouth/Throat:     Mouth: Mucous membranes are moist.     Pharynx: Oropharynx is clear.  Eyes:     Conjunctiva/sclera: Conjunctivae normal.     Pupils: Pupils are equal, round, and reactive to light.  Cardiovascular:     Rate and Rhythm: Normal rate and regular rhythm.     Pulses: Normal pulses.     Heart sounds: No murmur heard.   Pulmonary:     Effort: Pulmonary effort is normal. No respiratory distress.     Breath sounds: Normal breath sounds.  Abdominal:     General: There is no distension.     Palpations: Abdomen is soft.     Tenderness: There is no abdominal tenderness.      Comments: Obese  Genitourinary:    Comments: Deferred Musculoskeletal:     Right lower leg: Pitting Edema present.     Left lower leg: Pitting Edema present.       Legs:     Comments: She does appear to have chronic venous stasis changes to her bilateral lower extremities, there is pitting edema bilaterally, I am unable to palpate distal pulses in either extremity however I suspect the examination is limited given her body habitus.   Skin:    General: Skin is warm and dry.     Findings: Erythema and wound (left lateral leg) present.  Neurological:     General: No focal deficit present.     Mental Status: She is alert and oriented to person, place, and time.  Psychiatric:        Mood and Affect: Mood normal.        Behavior: Behavior normal.      Left Lower Leg wound (08/29/2020):      Labs:  CBC Latest Ref Rng & Units 08/29/2020 06/17/2017 06/16/2017  WBC 4.0 - 10.5 K/uL 9.3 7.1 7.6  Hemoglobin 12.0 - 15.0 g/dL 12.4 9.6(L) 9.9(L)  Hematocrit 36 - 46 % 37.1 28.7(L) 29.9(L)  Platelets 150 - 400 K/uL 385 261 300   CMP Latest Ref Rng & Units 08/29/2020 06/17/2017 06/16/2017  Glucose 70 - 99 mg/dL 112(H) 163(H) 164(H)  BUN 8 - 23 mg/dL 18 21(H) 28(H)  Creatinine 0.44 - 1.00 mg/dL 1.21(H) 0.89 1.14(H)  Sodium 135 - 145 mmol/L 143 138 138  Potassium 3.5 - 5.1 mmol/L 3.8 4.1 4.4  Chloride 98 - 111 mmol/L 105 107 106  CO2 22 - 32 mmol/L 25 24 27   Calcium 8.9 - 10.3 mg/dL 8.8(L) 8.2(L) 8.6(L)  Total Protein 6.5 - 8.1 g/dL 7.4 - 6.7  Total Bilirubin 0.3 - 1.2 mg/dL 0.8 - 0.5  Alkaline Phos 38 - 126 U/L 63 - 68  AST 15 - 41 U/L 17 - 15  ALT 0 - 44 U/L  16 - 13(L)     Imaging studies:   XR Left Foot/Tibia/Fibula (08/29/2020) personally reviewed showing left lateral leg ulcerative wound, no underlying bony changes consistent with osteomyelitis, she does have noticeable atherosclerosis present, and radiologist report reviewed:  IMPRESSION: 1. Soft tissue ulcerations of the calf and  plantar heel. No radiographic evidence of osteomyelitis. 2. 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 3. Marked soft tissue swelling of the foot. 4. Chronic appearing fracture deformity of the distal tibial metaphysis with varus angulation.   Assessment/Plan: (ICD-10's: T14.8XXA) 69 y.o. female with worsening of left lower extremity wound which I suspect is attributable to some degree of underlying vascular diease, complicated by pertinent comorbidities including morbid obesity, immobility secondary to CVA, uncontrolled HTN, and DM.   - I do believe she will benefit from debridement of this left lateral leg wound. My worry is that she has some degree of vascular insufficiency in this leg given her edema, venous stasis changes, and difficulty palpating arterial pulses which may be, at a minimum, contributing to the severity of this lesion. I suspect she will not heal well from large debridement. I think it would be reasonable to discuss this with vascular surgery whether or not they are in agreement. We will be happy to debride this.    - In the interim, okay for diet tonight, recommend NPO at midnight  - Agree with IV Abx  - Local wound care with wet-to-dry dressing, pressure offload, elevate extremity with pillow  - pain control prn  - monitor labs  - further management per primary service; we will follow    All of the above findings and recommendations were discussed with the patient and her husband at bedside, and all of their questions were answered to their expressed satisfaction.  Thank you for the opportunity to participate in this patient's care.   -- Edison Simon, PA-C [facility] 08/29/2020, 3:57 PM [phone] M-F: 7am - 4pm

## 2020-08-29 NOTE — ED Provider Notes (Signed)
[facility] Emergency Department Provider Note   ____________________________________________   First MD Initiated Contact with Patient 08/29/20 1151     (approximate)  I have reviewed the triage vital signs and the nursing notes.   HISTORY  Chief Complaint Wound Infection   HPI Brooke Hopkins is a 69 y.o. female who comes in complaining of a increasingly larger decubitus on her left lower leg.  She has a small decubitus on her heel and a larger one that started as a quarter size on the back of her calf.  Husband is try to take care of the wound at home.  Patient is essentially bedbound and cannot get out of bed.  The ulcer has been getting larger and is now foul-smelling.  Patient has a history of hypertension has not had her medicines today she thinks she forgot them yesterday as well.        Past Medical History:  Diagnosis Date  . Chronic kidney disease   . Diabetes mellitus   . Hypertension   . Stroke [facility])     Patient Active Problem List   Diagnosis Date Noted  . UTI (urinary tract infection) 06/16/2017  . Hypertensive urgency 06/16/2017  . Pure hypercholesterolemia 08/17/2016  . Lymphedema 08/17/2016  . Wound infection 08/16/2016  . 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 Pressure ulcer 10/17/2015  . Cellulitis 10/13/2015    Past Surgical History:  Procedure Laterality Date  . ABDOMINAL HYSTERECTOMY    . CHOLECYSTECTOMY    . HAMMER TOE SURGERY    . KNEE ARTHROSCOPY    . LUMBAR DISC SURGERY  11/07/11  . LUMBAR WOUND DEBRIDEMENT  11/27/2011   Procedure: LUMBAR WOUND DEBRIDEMENT;  Surgeon: Eustace Moore;  Location: [place] NEUR[facility] ORS;  Service: Neurosurgery;  Laterality: N/A;  . TONSILLECTOMY      Prior to Admission medications   Medication Sig Start Date End Date Taking? Authorizing Provider  acetaminophen (TYLENOL) 325 MG tablet Take 2 tablets (650 mg total) by mouth every 6 (six) hours as  needed for mild pain (or Fever >/= 101). 06/20/17   Gouru, Aruna, MD  aspirin EC 81 MG tablet Take 81 mg by mouth daily.    [provider]  atorvastatin (LIPITOR) 80 MG tablet Take 1 tablet (80 mg total) by mouth daily at 6 PM. Patient not taking: Reported on 04/30/2017 10/17/15   Nicholes Mango, MD  cephALEXin (KEFLEX) 500 MG capsule Take 1 capsule (500 mg total) by mouth 2 (two) times daily. 06/20/17   Nicholes Mango, MD  cloNIDine (CATAPRES - DOSED IN MG/24 HR) 0.2 mg/24hr patch Place 1 patch (0.2 mg total) onto the skin once a week. 10/17/15   Nicholes Mango, MD  clopidogrel (PLAVIX) 75 MG tablet Take 75 mg by mouth daily.    [provider]  collagenase (SANTYL) ointment Apply topically daily. Patient not taking: Reported on 04/30/2017 10/17/15   Nicholes Mango, MD  hydrALAZINE (APRESOLINE) 25 MG tablet Take 1 tablet (25 mg total) by mouth every 8 (eight) hours. 06/21/17   Gouru, Illene Silver, MD  insulin aspart (NOVOLOG) 100 UNIT/ML injection Inject 0-15 Units into the skin 3 (three) times daily with meals. 06/20/17   Gouru, Illene Silver, MD  insulin lispro (HUMALOG) 100 UNIT/ML injection Inject 0-0.05 mLs (0-5 Units total) into the skin at bedtime. 06/20/17   Nicholes Mango, MD  lisinopril (ZESTRIL) 40 MG tablet  07/26/20   [provider]  ondansetron (ZOFRAN) 4 MG  tablet Take 1 tablet (4 mg total) by mouth every 6 (six) hours as needed for nausea. 06/20/17   Gouru, Illene Silver, MD  oxybutynin (DITROPAN) 5 MG tablet Take 5 mg by mouth 2 (two) times daily.    [provider]  oxyCODONE (ROXICODONE) 5 MG immediate release tablet Take 1 tablet (5 mg total) by mouth every 8 (eight) hours as needed for moderate pain. 06/20/17   Nicholes Mango, MD    Allergies Patient has no known allergies.  Family History  Problem Relation Age of Onset  . CAD Mother   . Hypertension Mother   . COPD Father   . Diabetes Sister   . Hypertension Sister     Social History Social History   Tobacco Use  .  Smoking status: Never Smoker  . Smokeless tobacco: Never Used  Vaping Use  . Vaping Use: Never used  Substance Use Topics  . Alcohol use: No  . Drug use: No    Review of Systems  Constitutional: No fever/chills Eyes: No visual changes. ENT: No sore throat. Cardiovascular: Denies chest pain. Respiratory: Denies shortness of breath. Gastrointestinal: No abdominal pain.  No nausea, no vomiting.  No diarrhea.  No constipation. Genitourinary: Negative for dysuria. Musculoskeletal: Negative for back pain. Skin: Negative for rash. Neurological: Negative for headaches, new focal weakness   ____________________________________________   PHYSICAL EXAM:  VITAL SIGNS: ED Triage Vitals  Enc Vitals Group     BP 08/29/20 1115 (!) 256/101     Pulse Rate 08/29/20 1117 90     Resp 08/29/20 1117 16     Temp 08/29/20 1117 98.3 F (36.8 C)     Temp Source 08/29/20 1117 Oral     SpO2 08/29/20 1117 99 %     Weight 08/29/20 1118 220 lb (99.8 kg)     Height 08/29/20 1118 5\' 6"  (1.676 m)     Head Circumference --      Peak Flow --      Pain Score 08/29/20 1121 8     Pain Loc --      Pain Edu? --      Excl. in [place]? --     Constitutional: Alert and oriented.  Uncomfortable Eyes: Conjunctivae are normal.  Head: Atraumatic. Nose: No congestion/rhinnorhea. Mouth/Throat: Mucous membranes are moist.  Oropharynx non-erythematous. Neck: No stridor.  Cardiovascular: Normal rate, regular rhythm. Grossly normal heart sounds.  Good peripheral circulation. Respiratory: Normal respiratory effort.  No retractions. Lungs CTAB. Gastrointestinal: Soft and nontender. No distention. No abdominal bruits. No CVA tenderness. Musculoskeletal: Left leg is chronically swollen decubiti as described in HPI Neurologic:  Normal speech and language. No new gross focal neurologic deficits are appreciated.  Skin:  Skin is warm, dry and intact. No rash noted. Psychiatric: Mood and affect are normal. Speech and behavior  are normal.  ____________________________________________   LABS (all labs ordered are listed, but only abnormal results are displayed)  Labs Reviewed  COMPREHENSIVE METABOLIC PANEL - Abnormal; Notable for the following components:      Result Value   Glucose, Bld 112 (*)    Creatinine, Ser 1.21 (*)    Calcium 8.8 (*)    Albumin 3.3 (*)    GFR calc non Af Amer 45 (*)    GFR calc Af Amer 52 (*)    All other components within normal limits  CK - Abnormal; Notable for the following components:   Total CK 28 (*)    All other components within normal limits  SEDIMENTATION RATE - Abnormal; Notable for the following components:   Sed Rate 47 (*)    All other components within normal limits  TROPONIN I (HIGH SENSITIVITY) - Abnormal; Notable for the following components:   Troponin I (High Sensitivity) 75 (*)    All other components within normal limits  LACTIC ACID, PLASMA  CBC WITH DIFFERENTIAL/PLATELET  LACTIC ACID, PLASMA  TROPONIN I (HIGH SENSITIVITY)   ____________________________________________  EKG   ____________________________________________  RADIOLOGY  ED MD interpretation: X-rays read by radiology reviewed by me show no obvious osteomyelitis  Official radiology report(s): DG Tibia/Fibula Left Port  Result Date: 08/29/2020 CLINICAL DATA:  Foot wound infection of the anterior distal tibia-fibula, left heel, and left calf EXAM: LEFT FOOT - 2 VIEW; PORTABLE LEFT TIBIA AND FIBULA - 2 VIEW COMPARISON:  X-ray 10/13/2015 FINDINGS: Bones are markedly demineralized. There is a chronic appearing fracture deformity of the distal tibial metaphysis with varus angulation. 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 No knee joint effusion. No acute fracture is identified. No evidence of cortical destruction or periostitis to suggest osteomyelitis. Soft tissue ulcerations at the posterolateral aspect of the calf and plantar heel. Marked soft tissue swelling of the  foot. Severe extensive atherosclerotic calcifications. IMPRESSION: 1. Soft tissue ulcerations of the calf and plantar heel. No radiographic evidence of osteomyelitis. 2. 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 3. Marked soft tissue swelling of the foot. 4. Chronic appearing fracture deformity of the distal tibial metaphysis with varus angulation. Electronically Signed   By: Davina Poke D.O.   On: 08/29/2020 13:16   DG Foot 2 Views Left  Result Date: 08/29/2020 CLINICAL DATA:  Foot wound infection of the anterior distal tibia-fibula, left heel, and left calf EXAM: LEFT FOOT - 2 VIEW; PORTABLE LEFT TIBIA AND FIBULA - 2 VIEW COMPARISON:  X-ray 10/13/2015 FINDINGS: Bones are markedly demineralized. There is a chronic appearing fracture deformity of the distal tibial metaphysis with varus angulation. 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 No knee joint effusion. No acute fracture is identified. No evidence of cortical destruction or periostitis to suggest osteomyelitis. Soft tissue ulcerations at the posterolateral aspect of the calf and plantar heel. Marked soft tissue swelling of the foot. Severe extensive atherosclerotic calcifications. IMPRESSION: 1. Soft tissue ulcerations of the calf and plantar heel. No radiographic evidence of osteomyelitis. 2. 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 3. Marked soft tissue swelling of the foot. 4. Chronic appearing fracture deformity of the distal tibial metaphysis with varus angulation. Electronically Signed   By: Davina Poke D.O.   On: 08/29/2020 13:16    ____________________________________________   PROCEDURES  Procedure(s) performed (including Critical Care):  Procedures   ____________________________________________   INITIAL IMPRESSION / ASSESSMENT AND PLAN / ED COURSE Patient with foul-smelling decubiti with necrotic base says and pain.  She will need to come in the  hospital for antibiotics and debridement.  She will probably also need some social work to get better equipment to care for her at home.  They do have a Hoyer lift at home but I am not sure if they have the proper bed for her to be on.              ____________________________________________   FINAL CLINICAL IMPRESSION(S) / ED DIAGNOSES  Final diagnoses:  Infected decubitus ulcer, stage III [facility])     ED Discharge Orders    None       Note:  This document was  prepared using Systems analyst and may include unintentional dictation errors.    Nena Polio, MD 08/29/20 1407

## 2020-08-30 ENCOUNTER — Encounter: Payer: Self-pay | Admitting: Internal Medicine

## 2020-08-30 DIAGNOSIS — L89899 Pressure ulcer of other site, unspecified stage: Secondary | ICD-10-CM

## 2020-08-30 DIAGNOSIS — L97509 Non-pressure chronic ulcer of other part of unspecified foot with unspecified severity: Secondary | ICD-10-CM

## 2020-08-30 DIAGNOSIS — L89529 Pressure ulcer of left ankle, unspecified stage: Secondary | ICD-10-CM

## 2020-08-30 DIAGNOSIS — I87013 Postthrombotic syndrome with ulcer of bilateral lower extremity: Secondary | ICD-10-CM

## 2020-08-30 DIAGNOSIS — L089 Local infection of the skin and subcutaneous tissue, unspecified: Secondary | ICD-10-CM

## 2020-08-30 DIAGNOSIS — T148XXA Other injury of unspecified body region, initial encounter: Secondary | ICD-10-CM

## 2020-08-30 DIAGNOSIS — E11621 Type 2 diabetes mellitus with foot ulcer: Secondary | ICD-10-CM

## 2020-08-30 DIAGNOSIS — Z794 Long term (current) use of insulin: Secondary | ICD-10-CM

## 2020-08-30 DIAGNOSIS — I10 Essential (primary) hypertension: Secondary | ICD-10-CM

## 2020-08-30 DIAGNOSIS — L8993 Pressure ulcer of unspecified site, stage 3: Secondary | ICD-10-CM

## 2020-08-30 LAB — BASIC METABOLIC PANEL
Anion gap: 10 (ref 5–15)
BUN: 25 mg/dL — ABNORMAL HIGH (ref 8–23)
CO2: 26 mmol/L (ref 22–32)
Calcium: 8.5 mg/dL — ABNORMAL LOW (ref 8.9–10.3)
Chloride: 103 mmol/L (ref 98–111)
Creatinine, Ser: 1.78 mg/dL — ABNORMAL HIGH (ref 0.44–1.00)
GFR calc Af Amer: 33 mL/min — ABNORMAL LOW (ref >60)
GFR calc non Af Amer: 28 mL/min — ABNORMAL LOW (ref >60)
Glucose, Bld: 143 mg/dL — ABNORMAL HIGH (ref 70–99)
Potassium: 3.8 mmol/L (ref 3.5–5.1)
Sodium: 139 mmol/L (ref 135–145)

## 2020-08-30 LAB — CBC
HCT: 34.4 % — ABNORMAL LOW (ref 36.0–46.0)
Hemoglobin: 11 g/dL — ABNORMAL LOW (ref 12.0–15.0)
MCH: 27.6 pg (ref 26.0–34.0)
MCHC: 32 g/dL (ref 30.0–36.0)
MCV: 86.4 fL (ref 80.0–100.0)
Platelets: 312 10*3/uL (ref 150–400)
RBC: 3.98 MIL/uL (ref 3.87–5.11)
RDW: 13.5 % (ref 11.5–15.5)
WBC: 9.4 10*3/uL (ref 4.0–10.5)
nRBC: 0 % (ref 0.0–0.2)

## 2020-08-30 LAB — HEMOGLOBIN A1C
Hgb A1c MFr Bld: 5.2 % (ref 4.8–5.6)
Mean Plasma Glucose: 102.54 mg/dL

## 2020-08-30 LAB — HIV ANTIBODY (ROUTINE TESTING W REFLEX): HIV Screen 4th Generation wRfx: NONREACTIVE

## 2020-08-30 LAB — TROPONIN I (HIGH SENSITIVITY): Troponin I (High Sensitivity): 54 ng/L — ABNORMAL HIGH (ref <18)

## 2020-08-30 LAB — GLUCOSE, CAPILLARY
Glucose-Capillary: 131 mg/dL — ABNORMAL HIGH (ref 70–99)
Glucose-Capillary: 129 mg/dL — ABNORMAL HIGH (ref 70–99)
Glucose-Capillary: 107 mg/dL — ABNORMAL HIGH (ref 70–99)
Glucose-Capillary: 126 mg/dL — ABNORMAL HIGH (ref 70–99)

## 2020-08-30 MED ORDER — LINEZOLID 600 MG/300ML IV SOLN
600.0000 mg | Freq: Two times a day (BID) | INTRAVENOUS | Status: DC
  Administered 2020-08-31 – 2020-09-04 (×9): 600 mg via INTRAVENOUS
  Filled 2020-08-30 (×11): qty 300

## 2020-08-30 MED ORDER — VANCOMYCIN HCL IN DEXTROSE 1-5 GM/200ML-% IV SOLN
1000.0000 mg | INTRAVENOUS | Status: DC
  Filled 2020-08-30: qty 200

## 2020-08-30 NOTE — Progress Notes (Signed)
PROGRESS NOTE    Patient: Brooke Hopkins                            PCP: Leonel Ramsay, MD                    DOB: April 24, 1951            DOA: 08/29/2020 [id number]             DOS: 08/30/2020, 12:26 PM   LOS: 1 day   Date of Service: The patient was seen and examined on 08/30/2020  Subjective:   The patient was seen and examined this Am. Hemodynamically stable.  NOP    Brief Narrative:   MAREN WIESEN is an 69 y.o. female with PMH significant for HTN, DM 2, cerebrovascular disease status post CVA and CKD who states she was in her usual state of relatively poor health, she is bedbound and dependent on Hoyer lift since her stroke, until 2 weeks ago when she developed a quarter shaped ulcer in the back of her left calf... Which has been progressively getting worse, foul-smelling, draining.   As noted above she is bedbound and is unable to move her leg since her stroke.    ED Course:   The patient was to be afebrile.  She was extremely hypertensive at 256/111.  She was noted to have a large foul-smelling ulcer.  Laboratory data were notable for no leukocytosis with a white count of 9.3 and a lactic acid of 1.8, anion gap 13.  Blood sugar was reasonably controlled at 112.  Plain film showed no evidence of osteomyelitis and sed rate was 47.  General surgery was consulted who noted she would need debridement however they were concerned that she may not have enough vascular flow to heal an extensive debridement.  Full consult is to follow.  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  Erythema surrounding decubitus ulcer/cellulitis -Management as above, continue empiric antibiotics of vancomycin and Zosyn -Follow cultures -Continue wound care   Elevated troponin -Flat, denies any chest pain -No acute changes in EKG, -Monitoring on telemetry closely   Pain control -We will continue with pain management, titrating meds accordingly Will start prn hydrocodone, have warned patient about constipation and to pay attention to her bowel regimen  HTN -Accelerated hypertension, home medication resumed: Hydralazine, lisinopril, clonidine patch As needed hydralazine IV,    DM2 -Diabetic diet -Currently n.p.o. -We will check blood sugar q. ACH S while n.p.o. every 6 hours with SSI coverage    Other information:   DVT prophylaxis: Enoxaparin ordered. Code Status: Full Family Communication: Patient's husband was at bedside.. Plan of care was discussed Disposition Plan: TBD Consults called: General surgery Admission status: Inpatient     Admission status:    Status is: Inpatient  Remains inpatient appropriate because:Inpatient level of care appropriate due to severity of illness   Dispo: The patient is from: Home  Anticipated d/c is to: Home likely with home health              Anticipated d/c date is: 3 days              Patient currently is not medically stable to d/c.        Procedures:   No admission procedures for hospital encounter.     Antimicrobials:  Anti-infectives (From admission, onward)   Start     Dose/Rate Route Frequency Ordered Stop   08/30/20 1800  vancomycin (VANCOCIN) IVPB 1000 mg/200 mL premix        1,000 mg 200 mL/hr over 60 Minutes Intravenous Every 24 hours 08/29/20 1725     08/29/20 1800  piperacillin-tazobactam (ZOSYN) IVPB  3.375 g        3.375 g 12.5 mL/hr over 240 Minutes Intravenous Every 8 hours 08/29/20 1717 09/01/20 2159   08/29/20 1800  vancomycin (VANCOREADY) IVPB 2000 mg/400 mL        2,000 mg 200 mL/hr over 120 Minutes Intravenous  Once 08/29/20 1718 08/29/20 2020       Medication:  . atorvastatin  80 mg Oral q1800  . cloNIDine  0.2 mg Transdermal Weekly  . clopidogrel  75 mg Oral Daily  . enoxaparin (LOVENOX) injection  40 mg Subcutaneous Q24H  . hydrALAZINE  25 mg Oral Q8H  . insulin aspart  0-20 Units Subcutaneous TID WC  . lisinopril  40 mg Oral Daily  . sodium chloride flush  3 mL Intravenous Q12H    sodium chloride, bisacodyl, HYDROcodone-acetaminophen, ondansetron **OR** ondansetron (ZOFRAN) IV, polyethylene glycol, sodium chloride flush   Objective:   Vitals:   08/30/20 0500 08/30/20 0703 08/30/20 1200 08/30/20 1209  BP: (!) 159/52 (!) 149/50 (!) 159/52 (!) 159/52  Pulse: 64 79  77  Resp: 14 15  17   Temp:      TempSrc:      SpO2: 96% 98%  95%  Weight:      Height:       No intake or output data in the 24 hours ending 08/30/20 1226 Filed Weights   08/29/20 1118  Weight: 99.8 kg     Examination:   Physical Exam  Constitution:  Alert, cooperative, no distress,  Appears calm and comfortable  Psychiatric: Normal and stable mood and affect, cognition intact,   HEENT: Normocephalic, PERRL, otherwise with in Normal limits  Chest:Chest symmetric Cardio vascular:  S1/S2, RRR, No murmure, No Rubs or Gallops  pulmonary: Clear to auscultation bilaterally, respirations unlabored, negative wheezes / crackles Abdomen: Soft, non-tender, non-distended, bowel sounds,no masses, no organomegaly Muscular skeletal: Limited exam - in bed, able to move all 4 extremities, Normal strength,  Neuro: CNII-XII intact. , normal motor and sensation, reflexes intact  Extremities: No pitting edema lower extremities, +2 pulses  Skin: Dry, warm to touch, negative for any Rashes, LLL wounds Wounds:  as noted:     Left Lower Leg wound (08/29/2020):    ------------------------------------------------------------------------------------------------------------------------------------------    LABs:  CBC Latest Ref Rng & Units 08/30/2020 08/29/2020 06/17/2017  WBC 4.0 - 10.5 K/uL 9.4 9.3 7.1  Hemoglobin 12.0 - 15.0 g/dL 11.0(L) 12.4 9.6(L)  Hematocrit 36 - 46 % 34.4(L) 37.1 28.7(L)  Platelets 150 - 400 K/uL 312 385 261   CMP Latest Ref Rng & Units 08/30/2020 08/29/2020 06/17/2017  Glucose 70 - 99 mg/dL 143(H) 112(H) 163(H)  BUN 8 - 23 mg/dL 25(H) 18 21(H)  Creatinine 0.44 - 1.00 mg/dL 1.78(H) 1.21(H) 0.89  Sodium 135 - 145 mmol/L 139 143 138  Potassium 3.5 - 5.1 mmol/L 3.8 3.8 4.1  Chloride 98 - 111 mmol/L 103 105 107  CO2 22 - 32 mmol/L 26 25 24   Calcium 8.9 - 10.3 mg/dL 8.5(L) 8.8(L) 8.2(L)  Total Protein 6.5 - 8.1 g/dL - 7.4 -  Total Bilirubin 0.3 - 1.2 mg/dL - 0.8 -  Alkaline Phos 38 - 126 U/L - 63 -  AST 15 - 41 U/L - 17 -  ALT 0 - 44 U/L - 16 -       Micro Results Recent Results (from the past 240 hour(s))  SARS Coronavirus 2 by RT PCR (hospital order, performed in [facility] hospital lab) Nasopharyngeal Nasopharyngeal Swab     Status: None   Collection Time: 08/29/20 11:25 AM   Specimen: Nasopharyngeal Swab  Result Value Ref Range Status   SARS Coronavirus 2 NEGATIVE NEGATIVE Final    Comment: (NOTE) SARS-CoV-2 target nucleic acids are NOT DETECTED.  The SARS-CoV-2 RNA is generally detectable in upper and lower respiratory specimens during the acute phase of infection. The lowest concentration of SARS-CoV-2 viral copies this assay can detect is 250 copies / mL. A negative result does not preclude SARS-CoV-2 infection and should not be used as the sole basis for treatment or other patient management decisions.  A negative result may occur with improper specimen collection / handling, submission of specimen other than nasopharyngeal swab, presence of viral  mutation(s) within the areas targeted by this assay, and inadequate number of viral copies (<250 copies / mL). A negative result must be combined with clinical observations, patient history, and epidemiological information.  Fact Sheet for Patients:   StrictlyIdeas.no  Fact Sheet for Healthcare Providers: BankingDealers.co.za  This test is not yet approved or  cleared by the [place] FDA and has been authorized for detection and/or diagnosis of SARS-CoV-2 by FDA under an Emergency Use Authorization (EUA).  This EUA will remain in effect (meaning this test can be used) for the duration of the COVID-19 declaration under Section 564(b)(1) of the Act, 21 U.S.C. section 360bbb-3(b)(1), unless the authorization is terminated or revoked sooner.  Performed at [facility], [address] [place], [place] [address]   Blood culture (routine x 2)     Status: None (Preliminary result)   Collection Time: 08/29/20 11:25 AM   Specimen: BLOOD  Result Value Ref Range Status   Specimen Description BLOOD RIGHT ANTECUBITAL  Final   Special Requests   Final    BOTTLES DRAWN AEROBIC AND ANAEROBIC Blood Culture adequate volume   Culture   Final    NO GROWTH < 12 HOURS Performed at [facility], [address], [place], [place] [address]    Report Status PENDING  Incomplete  Blood culture (routine x 2)     Status: None (Preliminary result)   Collection Time: 08/29/20 11:25 AM   Specimen: BLOOD  Result Value Ref Range Status   Specimen Description BLOOD BLOOD LEFT FOREARM  Final   Special Requests   Final    BOTTLES DRAWN AEROBIC AND ANAEROBIC Blood Culture adequate volume   Culture   Final    NO GROWTH < 12 HOURS Performed at [facility], [address], [place], [place] [address]    Report Status PENDING  Incomplete    Radiology Reports DG Tibia/Fibula Left Port  Result Date: 08/29/2020 CLINICAL DATA:  Foot  wound infection of the anterior distal tibia-fibula, left heel, and left calf  EXAM: LEFT FOOT - 2 VIEW; PORTABLE LEFT TIBIA AND FIBULA - 2 VIEW COMPARISON:  X-ray 10/13/2015 FINDINGS: Bones are markedly demineralized. There is a chronic appearing fracture deformity of the distal tibial metaphysis with varus angulation. 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 No knee joint effusion. No acute fracture is identified. No evidence of cortical destruction or periostitis to suggest osteomyelitis. Soft tissue ulcerations at the posterolateral aspect of the calf and plantar heel. Marked soft tissue swelling of the foot. Severe extensive atherosclerotic calcifications. IMPRESSION: 1. Soft tissue ulcerations of the calf and plantar heel. No radiographic evidence of osteomyelitis. 2. 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 3. Marked soft tissue swelling of the foot. 4. Chronic appearing fracture deformity of the distal tibial metaphysis with varus angulation. Electronically Signed   By: Davina Poke D.O.   On: 08/29/2020 13:16   DG Foot 2 Views Left  Result Date: 08/29/2020 CLINICAL DATA:  Foot wound infection of the anterior distal tibia-fibula, left heel, and left calf EXAM: LEFT FOOT - 2 VIEW; PORTABLE LEFT TIBIA AND FIBULA - 2 VIEW COMPARISON:  X-ray 10/13/2015 FINDINGS: Bones are markedly demineralized. There is a chronic appearing fracture deformity of the distal tibial metaphysis with varus angulation. 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 No knee joint effusion. No acute fracture is identified. No evidence of cortical destruction or periostitis to suggest osteomyelitis. Soft tissue ulcerations at the posterolateral aspect of the calf and plantar heel. Marked soft tissue swelling of the foot. Severe extensive atherosclerotic calcifications. IMPRESSION: 1. Soft tissue ulcerations of the calf and plantar heel. No radiographic evidence of  osteomyelitis. 2. 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 3. Marked soft tissue swelling of the foot. 4. Chronic appearing fracture deformity of the distal tibial metaphysis with varus angulation. Electronically Signed   By: Davina Poke D.O.   On: 08/29/2020 13:16    SIGNED: Deatra James, MD, FACP, FHM. [facility],  Pager (please use [email or web address] to [email or web address])  If 7PM-7AM, please contact night-coverage [email or web address] 08/30/2020, 12:26 PM

## 2020-08-30 NOTE — Consult Note (Signed)
Pharmacy Antibiotic Note  Brooke Hopkins is a 69 y.o. female with medical history including HTN, T2DM, hx CVA, and CKD admitted on 08/29/2020 with expanding ulcer on back of left calf.  Pharmacy has been consulted for vancomycin + Zosyn dosing. Per consult instructions, duration of therapy is defined at 3 days.  Worsening renal function today( Scr 1.21 >> 1.78).  Plan: Zosyn 3.375g IV q8h (4 hour infusion).   Vancomycin adjusted from 1000 mg q24h to 1000 mg Q36H (expected Cssmin 12.4).  Daily Scr per protocol. Monitor closely for nephrotoxicity with vancomycin + Zosyn combination. Do not anticipate checking levels unless planned duration of therapy is extended.  Height: 5\' 6"  (167.6 cm) Weight: 99.8 kg (220 lb) IBW/kg (Calculated) : 59.3  No data recorded.  Recent Labs  Lab 08/29/20 1125 08/30/20 0504  WBC 9.3 9.4  CREATININE 1.21* 1.78*  LATICACIDVEN 1.8  --     Estimated Creatinine Clearance: 35.1 mL/min (A) (by C-G formula based on SCr of 1.78 mg/dL (H)).    No Known Allergies  Antimicrobials this admission: Zosyn 8/31 >>  Vancomycin 8/31 >>   Dose adjustments this admission: n/a  Microbiology results: 8/31 BCx: pending  Thank you for allowing pharmacy to be a part of this patient's care.  Rowland Lathe 08/30/2020 3:56 PM

## 2020-08-30 NOTE — ED Notes (Signed)
Food tray was given with juice. 

## 2020-08-30 NOTE — Consult Note (Signed)
[facility] Consult Note  MRN : [id number]  Brooke Hopkins is a 69 y.o. ([birth year]) female who presents with chief complaint of  Chief Complaint  Patient presents with  . Wound Infection   History of Present Illness: The patient is a 69 year old female with a PMH significant for HTN, DM 2, cerebrovascular disease status post CVA and CKD who states she was in her usual state of relatively poor health, she is bedbound and dependent on Hoyer lift since her stroke, who presented to [facility] ED for evaluation of left lower extremity wounds. Patient reports a two week history of worsening LLE wound to her lateral left lower leg and on the dorsal aspect of her foot. These started out as small lesion and her and her husband were trying neosporin and superficial dressings without any improvement. These wounds have now become severe and foul smelling which prompted her visit. She denied any associated fever, chills, CP, SOB, nausea, emesis, urinary changes, or bowel changes. No trauma or injury prior to the onset of these wounds.  Patient was seen in our clinic in 2017 for lymphedema.  At that time, the patient was encouraged to wear medical grade 1 compression socks and use her lymphedema pump at least twice a day.  Unfortunately, the patient has not been compliant with these therapies.  Vascular surgery was consulted by Edison Simon PA-C for possible peripheral artery disease.  Current Facility-Administered Medications  Medication Dose Route Frequency Provider Last Rate Last Admin  . 0.9 %  sodium chloride infusion  250 mL Intravenous PRN Bonnell Public Tublu, MD      . atorvastatin (LIPITOR) tablet 80 mg  80 mg Oral q1800 Bonnell Public Tublu, MD   80 mg at 08/29/20 1733  . bisacodyl (DULCOLAX) EC tablet 5 mg  5 mg Oral Daily PRN Bonnell Public Tublu, MD      . cloNIDine (CATAPRES - Dosed in mg/24 hr) patch 0.2 mg  0.2 mg Transdermal Weekly  Bonnell Public Tublu, MD   0.2 mg at 08/29/20 1733  . clopidogrel (PLAVIX) tablet 75 mg  75 mg Oral Daily Bonnell Public Tublu, MD   75 mg at 08/30/20 1201  . enoxaparin (LOVENOX) injection 40 mg  40 mg Subcutaneous Q24H Bonnell Public Tublu, MD   40 mg at 08/30/20 0102  . hydrALAZINE (APRESOLINE) tablet 25 mg  25 mg Oral Q8H Bonnell Public Tublu, MD   25 mg at 08/30/20 0705  . HYDROcodone-acetaminophen (NORCO/VICODIN) 5-325 MG per tablet 1-2 tablet  1-2 tablet Oral Q4H PRN Vashti Hey, MD   2 tablet at 08/30/20 0847  . insulin aspart (novoLOG) injection 0-20 Units  0-20 Units Subcutaneous TID WC Vashti Hey, MD   3 Units at 08/30/20 1206  . lisinopril (ZESTRIL) tablet 40 mg  40 mg Oral Daily Bonnell Public Tublu, MD   40 mg at 08/30/20 1200  . ondansetron (ZOFRAN) tablet 4 mg  4 mg Oral Q6H PRN Vashti Hey, MD       Or  . ondansetron [facility]) injection 4 mg  4 mg Intravenous Q6H PRN Bonnell Public Tublu, MD      . piperacillin-tazobactam (ZOSYN) IVPB 3.375 g  3.375 g Intravenous Q8H Benita Gutter, RPH   Stopped at 08/30/20 1126  . polyethylene glycol (MIRALAX / GLYCOLAX) packet 17 g  17 g Oral Daily PRN Bonnell Public Tublu, MD      . sodium chloride flush (NS) 0.9 % injection 3 mL  3 mL Intravenous Q12H Bonnell Public Tublu, MD   3 mL at 08/30/20 1202  . sodium chloride flush (NS) 0.9 % injection 3 mL  3 mL Intravenous PRN Bonnell Public Tublu, MD      . Derrill Memo ON 08/31/2020] vancomycin (VANCOCIN) IVPB 1000 mg/200 mL premix  1,000 mg Intravenous Q36H [facility]       Past Medical History:  Diagnosis Date  . Chronic kidney disease   . Diabetes mellitus   . Hypertension   . Stroke [facility])    Past Surgical History:  Procedure Laterality Date  . ABDOMINAL HYSTERECTOMY    . CHOLECYSTECTOMY    . HAMMER TOE SURGERY    . KNEE ARTHROSCOPY    . LUMBAR DISC SURGERY  11/07/11  . LUMBAR WOUND  DEBRIDEMENT  11/27/2011   Procedure: LUMBAR WOUND DEBRIDEMENT;  Surgeon: Eustace Moore;  Location: [facility];  Service: Neurosurgery;  Laterality: N/A;  . TONSILLECTOMY     Social History Social History   Tobacco Use  . Smoking status: Never Smoker  . Smokeless tobacco: Never Used  Vaping Use  . Vaping Use: Never used  Substance Use Topics  . Alcohol use: No  . Drug use: No   Family History Family History  Problem Relation Age of Onset  . CAD Mother   . Hypertension Mother   . COPD Father   . Diabetes Sister   . Hypertension Sister   Denies family history of peripheral artery disease, venous disease or renal disease.  No Known Allergies  REVIEW OF SYSTEMS (Negative unless checked)  Constitutional: [] Weight loss  [] Fever  [] Chills Cardiac: [] Chest pain   [] Chest pressure   [] Palpitations   [] Shortness of breath when laying flat   [] Shortness of breath at rest   [] Shortness of breath with exertion. Vascular:  [] Pain in legs with walking   [] Pain in legs at rest   [] Pain in legs when laying flat   [] Claudication   [] Pain in feet when walking  [] Pain in feet at rest  [] Pain in feet when laying flat   [] History of DVT   [] Phlebitis   [x] Swelling in legs   [] Varicose veins   [x] Non-healing ulcers Pulmonary:   [] Uses home oxygen   [] Productive cough   [] Hemoptysis   [] Wheeze  [] COPD   [] Asthma Neurologic:  [] Dizziness  [] Blackouts   [] Seizures   [x] History of stroke   [] History of TIA  [] Aphasia   [] Temporary blindness   [] Dysphagia   [] Weakness or numbness in arms   [] Weakness or numbness in legs Musculoskeletal:  [] Arthritis   [] Joint swelling   [] Joint pain   [] Low back pain Hematologic:  [] Easy bruising  [] Easy bleeding   [] Hypercoagulable state   [] Anemic  [] Hepatitis Gastrointestinal:  [] Blood in stool   [] Vomiting blood  [] Gastroesophageal reflux/heartburn   [] Difficulty swallowing. Genitourinary:  [x] Chronic kidney disease   [] Difficult urination  [] Frequent urination   [] Burning with urination   [] Blood in urine Skin:  [] Rashes   [x] Ulcers   [x] Wounds Psychological:  [] History of anxiety   []  History of major depression.  Physical Examination  Vitals:   08/30/20 0500 08/30/20 0703 08/30/20 1200 08/30/20 1209  BP: (!) 159/52 (!) 149/50 (!) 159/52 (!) 159/52  Pulse: 64 79  77  Resp: 14 15  17   Temp:      TempSrc:      SpO2: 96% 98%  95%  Weight:      Height:  Body mass index is 35.51 kg/m. Gen:  WD/WN, NAD Head: [place]/AT, No temporalis wasting. Prominent temp pulse not noted. Ear/Nose/Throat: Hearing grossly intact, nares w/o erythema or drainage, oropharynx w/o Erythema/Exudate Eyes: Sclera non-icteric, conjunctiva clear Neck: Trachea midline.  No JVD.  Pulmonary:  Good air movement, respirations not labored, equal bilaterally.  Cardiac: RRR, normal S1, S2. Vascular:  Vessel Right Left  Radial Palpable Palpable  Ulnar Palpable Palpable  Brachial Palpable Palpable  Carotid Palpable, without bruit Palpable, without bruit  Aorta Not palpable N/A  Femoral Palpable Palpable  Popliteal Palpable Palpable  PT Non-Palpable Non-Palpable  DP Non-Palpable Non-Palpable   Left lower extremity: Thigh is soft.  Calf soft.  Extremity is warm distally to toes.  Moderate pitting edema.  Venous stasis changes noted.  Hard to palpate pedal pulses on exam.  Wound noted to the left lateral leg.  Necrotic with surrounding erythema.  Right lower extremity: Thigh is soft.  Calf soft.  Extremity is warm distally to toes.  Moderate pitting edema.  Venous stasis changes noted.  Hard to palpate pedal pulses on exam.  Gastrointestinal: soft, non-tender/non-distended. No guarding/reflex.  Musculoskeletal: M/S 5/5 throughout.  Extremities without ischemic changes.  No deformity or atrophy. No edema. Neurologic: Sensation grossly intact in extremities.  Symmetrical.  Speech is fluent. Motor exam as listed above. Psychiatric: Judgment intact, Mood & affect appropriate  for pt's clinical situation. Dermatologic: As above Lymph : No Cervical, Axillary, or Inguinal lymphadenopathy  CBC Lab Results  Component Value Date   WBC 9.4 08/30/2020   HGB 11.0 (L) 08/30/2020   HCT 34.4 (L) 08/30/2020   MCV 86.4 08/30/2020   PLT 312 08/30/2020   BMET    Component Value Date/Time   NA 139 08/30/2020 0504   K 3.8 08/30/2020 0504   CL 103 08/30/2020 0504   CO2 26 08/30/2020 0504   GLUCOSE 143 (H) 08/30/2020 0504   BUN 25 (H) 08/30/2020 0504   CREATININE 1.78 (H) 08/30/2020 0504   CALCIUM 8.5 (L) 08/30/2020 0504   GFRNONAA 28 (L) 08/30/2020 0504   GFRAA 33 (L) 08/30/2020 0504   Estimated Creatinine Clearance: 35.1 mL/min (A) (by C-G formula based on SCr of 1.78 mg/dL (H)).  COAG No results found for: INR, PROTIME  Radiology DG Tibia/Fibula Left Port  Result Date: 08/29/2020 CLINICAL DATA:  Foot wound infection of the anterior distal tibia-fibula, left heel, and left calf EXAM: LEFT FOOT - 2 VIEW; PORTABLE LEFT TIBIA AND FIBULA - 2 VIEW COMPARISON:  X-ray 10/13/2015 FINDINGS: Bones are markedly demineralized. There is a chronic appearing fracture deformity of the distal tibial metaphysis with varus angulation. 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 No knee joint effusion. No acute fracture is identified. No evidence of cortical destruction or periostitis to suggest osteomyelitis. Soft tissue ulcerations at the posterolateral aspect of the calf and plantar heel. Marked soft tissue swelling of the foot. Severe extensive atherosclerotic calcifications. IMPRESSION: 1. Soft tissue ulcerations of the calf and plantar heel. No radiographic evidence of osteomyelitis. 2. 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 3. Marked soft tissue swelling of the foot. 4. Chronic appearing fracture deformity of the distal tibial metaphysis with varus angulation. Electronically Signed   By: Davina Poke D.O.   On: 08/29/2020 13:16   DG  Foot 2 Views Left  Result Date: 08/29/2020 CLINICAL DATA:  Foot wound infection of the anterior distal tibia-fibula, left heel, and left calf EXAM: LEFT FOOT - 2 VIEW; PORTABLE LEFT TIBIA AND  FIBULA - 2 VIEW COMPARISON:  X-ray 10/13/2015 FINDINGS: Bones are markedly demineralized. There is a chronic appearing fracture deformity of the distal tibial metaphysis with varus angulation. 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 No knee joint effusion. No acute fracture is identified. No evidence of cortical destruction or periostitis to suggest osteomyelitis. Soft tissue ulcerations at the posterolateral aspect of the calf and plantar heel. Marked soft tissue swelling of the foot. Severe extensive atherosclerotic calcifications. IMPRESSION: 1. Soft tissue ulcerations of the calf and plantar heel. No radiographic evidence of osteomyelitis. 2. 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 3. Marked soft tissue swelling of the foot. 4. Chronic appearing fracture deformity of the distal tibial metaphysis with varus angulation. Electronically Signed   By: Davina Poke D.O.   On: 08/29/2020 13:16   Assessment/Plan The patient is a 69 year old female with a PMH significant for HTN, DM 2, cerebrovascular disease status post CVA and CKD who states she was in her usual state of relatively poor health, she is bedbound and dependent on Hoyer lift since her stroke, who presented to [facility] ED for evaluation of left lower extremity wounds.  1.  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  Chronic nonhealing wound with gangrenous changes to the lateral aspect of the extremity.  Unable to palpate pedal pulses on exam.  Recommend undergoing a left lower extremity angiogram with possible intervention and attempt assess the patient's anatomy and contributing degree of atherosclerotic disease.   If appropriate, an attempt to revascularize leg made at that time.  Procedure, risks and benefits explained to the patient and her husband who is at the bedside.  All questions were answered.  We will plan on Friday with Dr. Delana Meyer.  2.  Lymphedema vs Venous Stasis Disease: Patient last seen in our office in 2017.  At the time, she was encouraged to wear medical grade 1 compression socks, engage in elevation of lower extremity and use her lymphedema pump at least twice a day.  Unfortunately, the patient has not been compliant with these therapies.  We will see the patient in our clinic after discharge for an updated venous reflux duplex, possible Unna boots and to review compression and elevation and use of her lymphedema pump.  Patient expresses her understanding.  3.  Chronic kidney disease: Please gently hydrate patient as we will need to use contrast to complete her angiogram on Friday.  4.  Hyperlipidemia: Patient is on Plavix and statin as an outpatient. Would add aspirin 81 mg p.o. daily medical management.  Discussed with Dr. Francene Castle, PA-C  08/30/2020 4:03 PM  This note was created with Dragon medical transcription system.  Any error is purely unintentional

## 2020-08-30 NOTE — Progress Notes (Signed)
[facility] SURGICAL PROGRESS NOTE (cpt [phone])  Hospital Day(s): 1.   Interval History: Patient seen and examined, no acute events or new complaints overnight. Patient reports she still has pain in the left lower extremity around the wound, denies fever, chills. She remains without leukocytosis this morning with WBC of 9.4K. She had a worsening of her renal function with sCr - 1.78. No significant electrolyte derangements. She has continued on Vancomycin and Zosyn. I made her NPO this morning as a precaution.   Review of Systems:  Constitutional: denies fever, chills  HEENT: denies cough or congestion  Respiratory: denies any shortness of breath  Cardiovascular: denies chest pain or palpitations  Gastrointestinal: denies abdominal pain, N/V/D Genitourinary: denies burning with urination or urinary frequency Musculoskeletal: + Weakness, bilateral LE, chronic Integumentary: + LLE Wound  Vital signs in last 24 hours: [min-max] current  Temp:  [98.3 F (36.8 C)] 98.3 F (36.8 C) (08/31 1117) Pulse Rate:  [64-99] 79 (09/01 0703) Resp:  [14-30] 15 (09/01 0703) BP: (142-256)/(50-111) 149/50 (09/01 0703) SpO2:  [91 %-100 %] 98 % (09/01 0703) Weight:  [99.8 kg] 99.8 kg (08/31 1118)     Height: 5\' 6"  (167.6 cm) Weight: 99.8 kg BMI (Calculated): 35.53   Intake/Output last 2 shifts:  No intake/output data recorded.   Physical Exam:  Constitutional: alert, cooperative and no distress  HENT: normocephalic without obvious abnormality  Eyes: PERRL, EOM's grossly intact and symmetric  Respiratory: breathing non-labored at rest  Cardiovascular: regular rate and sinus rhythm  Musculoskeletal: She has 2+ pitting edema to bilateral lower extremities, venous stasis changes to the bilateral lower extremities, difficulty assess arterial pulses bilateral likely secondary to habitus Integumentary: She has a large wound to the left lateral calf, wound bed is combination of liquefactive  necrosis and eschar, this is foul smelling, no significant erythema or drainage, no crepitus. She also have a smaller wound to the dorsal left foot at ankle joint.    Labs:  CBC Latest Ref Rng & Units 08/30/2020 08/29/2020 06/17/2017  WBC 4.0 - 10.5 K/uL 9.4 9.3 7.1  Hemoglobin 12.0 - 15.0 g/dL 11.0(L) 12.4 9.6(L)  Hematocrit 36 - 46 % 34.4(L) 37.1 28.7(L)  Platelets 150 - 400 K/uL 312 385 261   CMP Latest Ref Rng & Units 08/30/2020 08/29/2020 06/17/2017  Glucose 70 - 99 mg/dL 143(H) 112(H) 163(H)  BUN 8 - 23 mg/dL 25(H) 18 21(H)  Creatinine 0.44 - 1.00 mg/dL 1.78(H) 1.21(H) 0.89  Sodium 135 - 145 mmol/L 139 143 138  Potassium 3.5 - 5.1 mmol/L 3.8 3.8 4.1  Chloride 98 - 111 mmol/L 103 105 107  CO2 22 - 32 mmol/L 26 25 24   Calcium 8.9 - 10.3 mg/dL 8.5(L) 8.8(L) 8.2(L)  Total Protein 6.5 - 8.1 g/dL - 7.4 -  Total Bilirubin 0.3 - 1.2 mg/dL - 0.8 -  Alkaline Phos 38 - 126 U/L - 63 -  AST 15 - 41 U/L - 17 -  ALT 0 - 44 U/L - 16 -     Imaging studies: No new pertinent imaging studies   Assessment/Plan: (ICD-10's: T14.8XXA) 69 y.o. female with worsening of left lower extremity wound which I suspect is attributable to some degree of underlying vascular diease, complicated by pertinent comorbidities including morbid obesity, immobility secondary to CVA, uncontrolled HTN, and DM   -  I do believe she will benefit from debridement of this left lateral leg wound. My worry is that she has some degree of vascular insufficiency in this  leg given her edema, venous stasis changes, and difficulty palpating arterial pulses which may be, at a minimum, contributing to the severity and progression of this lesion. I suspect she will not heal well from large debridement. --> I did reach out to vascular surgery this morning, they will evaluate as well and I greatly appreciate their assistance.     - I do NOT suspect any necrotizing infection, which would warrant emergent intervention.   - NPO for now; pending  vascular evlauation   - Agree with IV Abx             - Local wound care with wet-to-dry dressing, pressure offload, elevate extremity with pillow             - pain control prn             - monitor labs             - further management per primary service; we will follow    All of the above findings and recommendations were discussed with the patient, patient's family (husband at bedside), and the medical team, and all of patient's and family's questions were answered to their expressed satisfaction.  -- Edison Simon, PA-C [facility] 08/30/2020, 7:41 AM [phone] M-F: 7am - 4pm

## 2020-08-30 NOTE — Progress Notes (Signed)
Report given to [facility] RN. Will transport patient to room.

## 2020-08-30 NOTE — ED Notes (Signed)
Pt requested an additional pillow under knee, with assistance from family this EDT placed another pillow under knee to make pt more comfortable. Additional blanket provide per pt request

## 2020-08-31 ENCOUNTER — Other Ambulatory Visit (INDEPENDENT_AMBULATORY_CARE_PROVIDER_SITE_OTHER): Payer: Self-pay | Admitting: Vascular Surgery

## 2020-08-31 LAB — CBC WITH DIFFERENTIAL/PLATELET
Abs Immature Granulocytes: 0.05 10*3/uL (ref 0.00–0.07)
Basophils Absolute: 0.1 10*3/uL (ref 0.0–0.1)
Basophils Relative: 1 %
Eosinophils Absolute: 0.2 10*3/uL (ref 0.0–0.5)
Eosinophils Relative: 2 %
HCT: 30.7 % — ABNORMAL LOW (ref 36.0–46.0)
Hemoglobin: 10.2 g/dL — ABNORMAL LOW (ref 12.0–15.0)
Immature Granulocytes: 1 %
Lymphocytes Relative: 22 %
Lymphs Abs: 1.8 10*3/uL (ref 0.7–4.0)
MCH: 27.3 pg (ref 26.0–34.0)
MCHC: 33.2 g/dL (ref 30.0–36.0)
MCV: 82.3 fL (ref 80.0–100.0)
Monocytes Absolute: 0.5 10*3/uL (ref 0.1–1.0)
Monocytes Relative: 6 %
Neutro Abs: 5.6 10*3/uL (ref 1.7–7.7)
Neutrophils Relative %: 68 %
Platelets: 285 10*3/uL (ref 150–400)
RBC: 3.73 MIL/uL — ABNORMAL LOW (ref 3.87–5.11)
RDW: 13.4 % (ref 11.5–15.5)
WBC: 8.3 10*3/uL (ref 4.0–10.5)
nRBC: 0 % (ref 0.0–0.2)

## 2020-08-31 LAB — BASIC METABOLIC PANEL
Anion gap: 9 (ref 5–15)
BUN: 27 mg/dL — ABNORMAL HIGH (ref 8–23)
CO2: 23 mmol/L (ref 22–32)
Calcium: 8.1 mg/dL — ABNORMAL LOW (ref 8.9–10.3)
Chloride: 107 mmol/L (ref 98–111)
Creatinine, Ser: 1.5 mg/dL — ABNORMAL HIGH (ref 0.44–1.00)
GFR calc Af Amer: 40 mL/min — ABNORMAL LOW (ref >60)
GFR calc non Af Amer: 35 mL/min — ABNORMAL LOW (ref >60)
Glucose, Bld: 115 mg/dL — ABNORMAL HIGH (ref 70–99)
Potassium: 3.5 mmol/L (ref 3.5–5.1)
Sodium: 139 mmol/L (ref 135–145)

## 2020-08-31 LAB — GLUCOSE, CAPILLARY
Glucose-Capillary: 129 mg/dL — ABNORMAL HIGH (ref 70–99)
Glucose-Capillary: 128 mg/dL — ABNORMAL HIGH (ref 70–99)
Glucose-Capillary: 150 mg/dL — ABNORMAL HIGH (ref 70–99)
Glucose-Capillary: 158 mg/dL — ABNORMAL HIGH (ref 70–99)
Glucose-Capillary: 158 mg/dL — ABNORMAL HIGH (ref 70–99)

## 2020-08-31 MED ORDER — SODIUM CHLORIDE 0.9 % IV SOLN: INTRAVENOUS | Status: DC

## 2020-08-31 NOTE — Progress Notes (Signed)
PROGRESS NOTE    Patient: Brooke Hopkins                            PCP: Leonel Ramsay, MD                    DOB: 1951/07/25            DOA: 08/29/2020 OIB:[id number]             DOS: 08/31/2020, 1:28 PM   LOS: 2 days   Date of Service: The patient was seen and examined on 08/31/2020  Subjective:   The patient was seen and examined this morning, remained stable, still complain of lower extremity pain, Complaining of severe debility No debridement was pursued yesterday, patient is now eating Vascular was consulted  Anticipating cardiovascular evaluation, possible work-up by vascular team today     Brief Narrative:   HALIE GASS is an 69 y.o. female with PMH significant for HTN, DM 2, cerebrovascular disease status post CVA and CKD who states she was in her usual state of relatively poor health, she is bedbound and dependent on [place] lift since her stroke, until 2 weeks ago when she developed a quarter shaped ulcer in the back of her left calf... Which has been progressively getting worse, foul-smelling, draining.   As noted above she is bedbound and is unable to move her leg since her stroke.    ED Course:   The patient was to be afebrile.  She was extremely hypertensive at 256/111.  She was noted to have a large foul-smelling ulcer.  Laboratory data were notable for no leukocytosis with a white count of 9.3 and a lactic acid of 1.8, anion gap 13.  Blood sugar was reasonably controlled at 112.  Plain film showed no evidence of osteomyelitis and sed rate was 47.  General surgery was consulted who noted she would need debridement however they were concerned that she may not have enough vascular flow to heal an extensive debridement.  Full consult is to follow.   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  Erythema surrounding decubitus ulcer/cellulitis -Management as above, continue empiric antibiotics of vancomycin and Zosyn -Follow cultures >>> no growth to date -Continue wound care -Appreciate general surgery and vascular follow-up regarding further debridement   Elevated troponin -Troponin remained flat, no chest pain -No acute changes in EKG, -Monitoring on telemetry closely   Pain control -We will continue to titrate medication for better pain control Will start prn hydrocodone, have warned patient about constipation and to pay attention to her bowel regimen  HTN -Accelerated hypertension, home medication resumed: Hydralazine, lisinopril, clonidine patch As needed hydralazine IV,    DM2 -Diabetic diet -Currently n.p.o. -We will check blood sugar q. ACH S while n.p.o. every 6 hours with SSI coverage  Severe debility/bedbound -in the setting of previous stroke -Continue antiplatelet therapy, statins -PT/OT for evaluation -Husband is willing to take patient back home but will appreciate if home  health can be set up   Other information:   DVT prophylaxis: Enoxaparin ordered. Code Status: Full Family Communication: Patient's husband was at bedside.. Plan of care was discussed Disposition Plan: TBD Consults called: General surgery/vascular Admission status: Inpatient     Admission status:    Status is: Inpatient  Remains inpatient appropriate because:Inpatient level of care appropriate due to severity of illness   Dispo: The patient is from:  Home              Anticipated d/c is to: Home likely with home health              Anticipated d/c date is: 3 days              Patient currently is not medically stable to d/c.        Procedures:   No admission procedures for hospital encounter.     Antimicrobials:  Anti-infectives (From admission, onward)   Start     Dose/Rate Route Frequency Ordered Stop   08/31/20 0600  vancomycin (VANCOCIN) IVPB 1000 mg/200 mL premix  Status:  Discontinued        1,000 mg 200 mL/hr over 60 Minutes Intravenous Every 36 hours 08/30/20 1558 08/30/20 1627   08/30/20 1800  vancomycin (VANCOCIN) IVPB 1000 mg/200 mL premix  Status:  Discontinued        1,000 mg 200 mL/hr over 60 Minutes Intravenous Every 24 hours 08/29/20 1725 08/30/20 1558   08/30/20 1800  linezolid (ZYVOX) IVPB 600 mg        600 mg 300 mL/hr over 60 Minutes Intravenous Every 12 hours 08/30/20 1627     08/29/20 1800  piperacillin-tazobactam (ZOSYN) IVPB 3.375 g        3.375 g 12.5 mL/hr over 240 Minutes Intravenous Every 8 hours 08/29/20 1717 09/01/20 2159   08/29/20 1800  vancomycin (VANCOREADY) IVPB 2000 mg/400 mL        2,000 mg 200 mL/hr over 120 Minutes Intravenous  Once 08/29/20 1718 08/29/20 2020       Medication:  . atorvastatin  80 mg Oral q1800  . cloNIDine  0.2 mg Transdermal Weekly  . clopidogrel  75 mg Oral Daily  . enoxaparin (LOVENOX) injection  40 mg Subcutaneous Q24H  . hydrALAZINE  25 mg Oral Q8H  . insulin aspart  0-20 Units Subcutaneous TID WC  . lisinopril  40 mg Oral Daily  . sodium chloride flush  3 mL Intravenous Q12H    sodium chloride, bisacodyl, HYDROcodone-acetaminophen, ondansetron **OR** ondansetron (ZOFRAN) IV, polyethylene glycol, sodium chloride flush   Objective:   Vitals:   08/31/20 0038 08/31/20 0610 08/31/20 1123 08/31/20 1154  BP: (!) 160/52 (!) 176/58 (!) 173/55 (!) 161/58  Pulse: 84 85 84 78  Resp: 20 20 20    Temp: 99.6 F (37.6 C) 98.9 F (37.2 C) 98.9 F (37.2 C)  99.4 F (37.4 C)  TempSrc: Oral Oral Oral Oral  SpO2: 96% 96% 94% 97%  Weight:      Height:        Intake/Output Summary (Last 24 hours) at 08/31/2020 1328 Last data filed at 08/31/2020 0900 Gross per 24 hour  Intake 331.87 ml  Output --  Net 331.87 ml   Filed Weights   08/29/20 1118  Weight: 99.8 kg     Examination:      Physical Exam:   General:  Alert, oriented, cooperative, no distress;   HEENT:  Normocephalic, PERRL, otherwise with in Normal limits  Neuro:  CNII-XII intact. , normal motor and sensation, reflexes intact   Lungs:   Clear to auscultation BL, Respirations unlabored, no wheezes / crackles  Cardio:    S1/S2, RRR, No murmure, No Rubs or Gallops   Abdomen:   Soft, non-tender, bowel sounds active all four quadrants,  no guarding or peritoneal signs.  Muscular skeletal:  Limited exam - in bed, able to move all 4 extremities, Normal strength,  LL Ext1+ pulses,  symmetric, No pitting edema  Skin:  Dry, warm to touch, negative for any Rashes, No open wounds  Wounds: Please see nursing documentation            Left Lower Leg wound (08/29/2020):    ------------------------------------------------------------------------------------------------------------------------------------------    LABs:  CBC Latest Ref Rng & Units 08/31/2020 08/30/2020 08/29/2020  WBC 4.0 - 10.5 K/uL 8.3 9.4 9.3  Hemoglobin 12.0 - 15.0 g/dL 10.2(L) 11.0(L) 12.4  Hematocrit 36 - 46 % 30.7(L) 34.4(L) 37.1  Platelets 150 - 400 K/uL 285 312 385   CMP Latest Ref Rng & Units 08/31/2020 08/30/2020 08/29/2020  Glucose 70 - 99 mg/dL 115(H) 143(H) 112(H)  BUN 8 - 23 mg/dL 27(H) 25(H) 18  Creatinine 0.44 - 1.00 mg/dL 1.50(H) 1.78(H) 1.21(H)  Sodium 135 - 145 mmol/L 139 139 143  Potassium 3.5 - 5.1 mmol/L 3.5 3.8 3.8  Chloride 98 - 111 mmol/L 107 103 105  CO2 22 - 32 mmol/L 23 26 25   Calcium 8.9 - 10.3 mg/dL 8.1(L) 8.5(L) 8.8(L)  Total Protein 6.5 - 8.1 g/dL - - 7.4  Total Bilirubin 0.3 - 1.2  mg/dL - - 0.8  Alkaline Phos 38 - 126 U/L - - 63  AST 15 - 41 U/L - - 17  ALT 0 - 44 U/L - - 16       Micro Results Recent Results (from the past 240 hour(s))  SARS Coronavirus 2 by RT PCR (hospital order, performed in [facility] hospital lab) Nasopharyngeal Nasopharyngeal Swab     Status: None   Collection Time: 08/29/20 11:25 AM   Specimen: Nasopharyngeal Swab  Result Value Ref Range Status   SARS Coronavirus 2 NEGATIVE NEGATIVE Final    Comment: (NOTE) SARS-CoV-2 target nucleic acids are NOT DETECTED.  The SARS-CoV-2 RNA is generally detectable in upper and lower respiratory specimens during the acute phase of infection. The lowest concentration of SARS-CoV-2 viral copies this assay can detect is 250 copies / mL. A negative result does not preclude SARS-CoV-2 infection and should not be used as the sole basis for treatment or other patient management decisions.  A negative result may occur with improper specimen collection / handling, submission of specimen other than nasopharyngeal swab, presence of viral mutation(s) within the areas targeted by this assay, and inadequate number of viral copies (<250 copies / mL). A negative result must be combined with clinical observations, patient history, and epidemiological information.  Fact Sheet for Patients:   StrictlyIdeas.no  Fact Sheet for Healthcare Providers: BankingDealers.co.za  This test is not yet approved or  cleared by the [place] FDA and has been authorized for detection and/or diagnosis of SARS-CoV-2 by FDA under an Emergency Use Authorization (EUA).  This EUA will remain in effect (meaning this test can be used) for the duration of the COVID-19 declaration under Section 564(b)(1) of the Act, 21 U.S.C. section 360bbb-3(b)(1), unless the authorization is terminated or revoked sooner.  Performed at [facility] [place], [address], [place], [place]  [address]   Blood culture (  routine x 2)     Status: None (Preliminary result)   Collection Time: 08/29/20 11:25 AM   Specimen: BLOOD  Result Value Ref Range Status   Specimen Description BLOOD RIGHT ANTECUBITAL  Final   Special Requests   Final    BOTTLES DRAWN AEROBIC AND ANAEROBIC Blood Culture adequate volume   Culture   Final    NO GROWTH 2 DAYS Performed at [facility], [address], [place], [place] [address]    Report Status PENDING  Incomplete  Blood culture (routine x 2)     Status: None (Preliminary result)   Collection Time: 08/29/20 11:25 AM   Specimen: BLOOD  Result Value Ref Range Status   Specimen Description BLOOD BLOOD LEFT FOREARM  Final   Special Requests   Final    BOTTLES DRAWN AEROBIC AND ANAEROBIC Blood Culture adequate volume   Culture   Final    NO GROWTH 2 DAYS Performed at [facility], [address] [place], [place] [address]    Report Status PENDING  Incomplete    Radiology Reports DG Tibia/Fibula Left Port  Result Date: 08/29/2020 CLINICAL DATA:  Foot wound infection of the anterior distal tibia-fibula, left heel, and left calf EXAM: LEFT FOOT - 2 VIEW; PORTABLE LEFT TIBIA AND FIBULA - 2 VIEW COMPARISON:  X-ray 10/13/2015 FINDINGS: Bones are markedly demineralized. There is a chronic appearing fracture deformity of the distal tibial metaphysis with varus angulation. 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 No knee joint effusion. No acute fracture is identified. No evidence of cortical destruction or periostitis to suggest osteomyelitis. Soft tissue ulcerations at the posterolateral aspect of the calf and plantar heel. Marked soft tissue swelling of the foot. Severe extensive atherosclerotic calcifications. IMPRESSION: 1. Soft tissue ulcerations of the calf and plantar heel. No radiographic evidence of osteomyelitis. 2. 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 3. Marked soft tissue swelling  of the foot. 4. Chronic appearing fracture deformity of the distal tibial metaphysis with varus angulation. Electronically Signed   By: Davina Poke D.O.   On: 08/29/2020 13:16   DG Foot 2 Views Left  Result Date: 08/29/2020 CLINICAL DATA:  Foot wound infection of the anterior distal tibia-fibula, left heel, and left calf EXAM: LEFT FOOT - 2 VIEW; PORTABLE LEFT TIBIA AND FIBULA - 2 VIEW COMPARISON:  X-ray 10/13/2015 FINDINGS: Bones are markedly demineralized. There is a chronic appearing fracture deformity of the distal tibial metaphysis with varus angulation. 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 No knee joint effusion. No acute fracture is identified. No evidence of cortical destruction or periostitis to suggest osteomyelitis. Soft tissue ulcerations at the posterolateral aspect of the calf and plantar heel. Marked soft tissue swelling of the foot. Severe extensive atherosclerotic calcifications. IMPRESSION: 1. Soft tissue ulcerations of the calf and plantar heel. No radiographic evidence of osteomyelitis. 2. 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 3. Marked soft tissue swelling of the foot. 4. Chronic appearing fracture deformity of the distal tibial metaphysis with varus angulation. Electronically Signed   By: Davina Poke D.O.   On: 08/29/2020 13:16    SIGNED: Deatra James, MD, FACP, FHM. [facility],  Pager (please use [email or web address] to page/text)  If 7PM-7AM, please contact night-coverage [email or web address], Password [facility] 08/31/2020, 1:28 PM

## 2020-08-31 NOTE — Progress Notes (Signed)
[facility] Daily Progress Note   Subjective: No issues overnight.  Without any significant change in symptoms.  Objective: Vitals:   08/31/20 0610 08/31/20 1123 08/31/20 1154 08/31/20 1408  BP: (!) 176/58 (!) 173/55 (!) 161/58 (!) 157/48  Pulse: 85 84 78 83  Resp: 20 20    Temp: 98.9 F (37.2 C) 98.9 F (37.2 C) 99.4 F (37.4 C) 98.6 F (37 C)  TempSrc: Oral Oral Oral Oral  SpO2: 96% 94% 97% 98%  Weight:      Height:        Intake/Output Summary (Last 24 hours) at 08/31/2020 1521 Last data filed at 08/31/2020 0900 Gross per 24 hour  Intake 331.87 ml  Output --  Net 331.87 ml   Physical Exam: A&Ox3, NAD CV: RRR Pulmonary: CTA Bilaterally Abdomen: Soft, Nontender, Nondistended Left lower extremity: Thigh is soft.  Calf soft.  Extremity is warm distally to toes.  Moderate pitting edema.  Venous stasis changes noted.  Hard to palpate pedal pulses on exam.  Wound noted to the left lateral leg.  Necrotic with surrounding erythema.   Laboratory: CBC    Component Value Date/Time   WBC 8.3 08/31/2020 0410   HGB 10.2 (L) 08/31/2020 0410   HCT 30.7 (L) 08/31/2020 0410   PLT 285 08/31/2020 0410   BMET    Component Value Date/Time   NA 139 08/31/2020 0410   K 3.5 08/31/2020 0410   CL 107 08/31/2020 0410   CO2 23 08/31/2020 0410   GLUCOSE 115 (H) 08/31/2020 0410   BUN 27 (H) 08/31/2020 0410   CREATININE 1.50 (H) 08/31/2020 0410   CALCIUM 8.1 (L) 08/31/2020 0410   GFRNONAA 35 (L) 08/31/2020 0410   GFRAA 40 (L) 08/31/2020 0410   Assessment/Planning: 1.  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 Chronic nonhealing wound with gangrenous changes to the lateral aspect of the extremity.  Unable to palpate pedal pulses on exam. Recommend undergoing a left lower extremity angiogram with possible intervention and attempt assess the patient's anatomy and  contributing degree of atherosclerotic disease.  If appropriate, an attempt to revascularize leg made at that time.  Procedure, risks and benefits explained to the patient and her husband who is at the bedside.  All questions were answered.  We will plan on Friday with Dr. Delana Meyer.  2.  Chronic kidney disease: Gently hydrating patient as we will need to use contrast to complete her angiogram on Friday.  Discussed with Dr. Eber Hong Madelynne Lasker PA-C 08/31/2020 3:21 PM

## 2020-08-31 NOTE — H&P (View-Only) (Signed)
[facility] Daily Progress Note   Subjective: No issues overnight.  Without any significant change in symptoms.  Objective: Vitals:   08/31/20 0610 08/31/20 1123 08/31/20 1154 08/31/20 1408  BP: (!) 176/58 (!) 173/55 (!) 161/58 (!) 157/48  Pulse: 85 84 78 83  Resp: 20 20    Temp: 98.9 F (37.2 C) 98.9 F (37.2 C) 99.4 F (37.4 C) 98.6 F (37 C)  TempSrc: Oral Oral Oral Oral  SpO2: 96% 94% 97% 98%  Weight:      Height:        Intake/Output Summary (Last 24 hours) at 08/31/2020 1521 Last data filed at 08/31/2020 0900 Gross per 24 hour  Intake 331.87 ml  Output --  Net 331.87 ml   Physical Exam: A&Ox3, NAD CV: RRR Pulmonary: CTA Bilaterally Abdomen: Soft, Nontender, Nondistended Left lower extremity: Thigh is soft.  Calf soft.  Extremity is warm distally to toes.  Moderate pitting edema.  Venous stasis changes noted.  Hard to palpate pedal pulses on exam.  Wound noted to the left lateral leg.  Necrotic with surrounding erythema.   Laboratory: CBC    Component Value Date/Time   WBC 8.3 08/31/2020 0410   HGB 10.2 (L) 08/31/2020 0410   HCT 30.7 (L) 08/31/2020 0410   PLT 285 08/31/2020 0410   BMET    Component Value Date/Time   NA 139 08/31/2020 0410   K 3.5 08/31/2020 0410   CL 107 08/31/2020 0410   CO2 23 08/31/2020 0410   GLUCOSE 115 (H) 08/31/2020 0410   BUN 27 (H) 08/31/2020 0410   CREATININE 1.50 (H) 08/31/2020 0410   CALCIUM 8.1 (L) 08/31/2020 0410   GFRNONAA 35 (L) 08/31/2020 0410   GFRAA 40 (L) 08/31/2020 0410   Assessment/Planning: 1.  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 Chronic nonhealing wound with gangrenous changes to the lateral aspect of the extremity.  Unable to palpate pedal pulses on exam. Recommend undergoing a left lower extremity angiogram with possible intervention and attempt assess the patient's anatomy and  contributing degree of atherosclerotic disease.  If appropriate, an attempt to revascularize leg made at that time.  Procedure, risks and benefits explained to the patient and her husband who is at the bedside.  All questions were answered.  We will plan on Friday with Dr. Delana Meyer.  2.  Chronic kidney disease: Gently hydrating patient as we will need to use contrast to complete her angiogram on Friday.  Discussed with Dr. Eber Hong Cleophas Yoak PA-C 08/31/2020 3:21 PM

## 2020-09-01 ENCOUNTER — Encounter
Admission: EM | Disposition: A | Discharge status: Home Health | Payer: Self-pay | Source: Home / Self Care | Attending: Family Medicine

## 2020-09-01 ENCOUNTER — Encounter: Payer: Self-pay | Admitting: Vascular Surgery

## 2020-09-01 DIAGNOSIS — I70262 Atherosclerosis of native arteries of extremities with gangrene, left leg: Secondary | ICD-10-CM

## 2020-09-01 HISTORY — PX: LOWER EXTREMITY ANGIOGRAPHY: CATH118251

## 2020-09-01 LAB — CBC WITH DIFFERENTIAL/PLATELET
Abs Immature Granulocytes: 0.04 10*3/uL (ref 0.00–0.07)
Basophils Absolute: 0.1 10*3/uL (ref 0.0–0.1)
Basophils Relative: 1 %
Eosinophils Absolute: 0.5 10*3/uL (ref 0.0–0.5)
Eosinophils Relative: 5 %
HCT: 29.6 % — ABNORMAL LOW (ref 36.0–46.0)
Hemoglobin: 9.7 g/dL — ABNORMAL LOW (ref 12.0–15.0)
Immature Granulocytes: 0 %
Lymphocytes Relative: 18 %
Lymphs Abs: 1.7 10*3/uL (ref 0.7–4.0)
MCH: 27.1 pg (ref 26.0–34.0)
MCHC: 32.8 g/dL (ref 30.0–36.0)
MCV: 82.7 fL (ref 80.0–100.0)
Monocytes Absolute: 0.6 10*3/uL (ref 0.1–1.0)
Monocytes Relative: 6 %
Neutro Abs: 6.7 10*3/uL (ref 1.7–7.7)
Neutrophils Relative %: 70 %
Platelets: 282 10*3/uL (ref 150–400)
RBC: 3.58 MIL/uL — ABNORMAL LOW (ref 3.87–5.11)
RDW: 13.4 % (ref 11.5–15.5)
WBC: 9.6 10*3/uL (ref 4.0–10.5)
nRBC: 0 % (ref 0.0–0.2)

## 2020-09-01 LAB — BASIC METABOLIC PANEL
Anion gap: 3 — ABNORMAL LOW (ref 5–15)
BUN: 25 mg/dL — ABNORMAL HIGH (ref 8–23)
CO2: 28 mmol/L (ref 22–32)
Calcium: 7.7 mg/dL — ABNORMAL LOW (ref 8.9–10.3)
Chloride: 106 mmol/L (ref 98–111)
Creatinine, Ser: 1.48 mg/dL — ABNORMAL HIGH (ref 0.44–1.00)
GFR calc Af Amer: 41 mL/min — ABNORMAL LOW (ref >60)
GFR calc non Af Amer: 36 mL/min — ABNORMAL LOW (ref >60)
Glucose, Bld: 128 mg/dL — ABNORMAL HIGH (ref 70–99)
Potassium: 3.7 mmol/L (ref 3.5–5.1)
Sodium: 137 mmol/L (ref 135–145)

## 2020-09-01 LAB — GLUCOSE, CAPILLARY
Glucose-Capillary: 116 mg/dL — ABNORMAL HIGH (ref 70–99)
Glucose-Capillary: 122 mg/dL — ABNORMAL HIGH (ref 70–99)
Glucose-Capillary: 125 mg/dL — ABNORMAL HIGH (ref 70–99)
Glucose-Capillary: 131 mg/dL — ABNORMAL HIGH (ref 70–99)
Glucose-Capillary: 138 mg/dL — ABNORMAL HIGH (ref 70–99)
Glucose-Capillary: 200 mg/dL — ABNORMAL HIGH (ref 70–99)

## 2020-09-01 SURGERY — LOWER EXTREMITY ANGIOGRAPHY
Anesthesia: Moderate Sedation | Laterality: Left

## 2020-09-01 MED ORDER — MIDAZOLAM HCL 2 MG/ML PO SYRP: 8.0000 mg | ORAL_SOLUTION | Freq: Once | ORAL | Status: DC | PRN

## 2020-09-01 MED ORDER — IODIXANOL 320 MG/ML IV SOLN
INTRAVENOUS | Status: DC | PRN
  Administered 2020-09-01: 75 mL

## 2020-09-01 MED ORDER — HEPARIN SODIUM (PORCINE) 1000 UNIT/ML IJ SOLN
INTRAMUSCULAR | Status: AC
  Filled 2020-09-01: qty 1

## 2020-09-01 MED ORDER — DIPHENHYDRAMINE HCL 50 MG/ML IJ SOLN: 50.0000 mg | Freq: Once | INTRAMUSCULAR | Status: DC | PRN

## 2020-09-01 MED ORDER — SODIUM CHLORIDE 0.9% FLUSH: 3.0000 mL | INTRAVENOUS | Status: DC | PRN

## 2020-09-01 MED ORDER — BACITRACIN-NEOMYCIN-POLYMYXIN 400-5-5000 EX OINT
TOPICAL_OINTMENT | CUTANEOUS | Status: AC
  Filled 2020-09-01: qty 1

## 2020-09-01 MED ORDER — FAMOTIDINE 20 MG PO TABS: 40.0000 mg | ORAL_TABLET | Freq: Once | ORAL | Status: DC | PRN

## 2020-09-01 MED ORDER — FENTANYL CITRATE (PF) 100 MCG/2ML IJ SOLN
INTRAMUSCULAR | Status: AC
  Filled 2020-09-01: qty 2

## 2020-09-01 MED ORDER — ONDANSETRON HCL 4 MG/2ML IJ SOLN
4.0000 mg | Freq: Four times a day (QID) | INTRAMUSCULAR | Status: DC | PRN
  Administered 2020-09-04: 4 mg via INTRAVENOUS
  Filled 2020-09-01: qty 2

## 2020-09-01 MED ORDER — HEPARIN SODIUM (PORCINE) 1000 UNIT/ML IJ SOLN
INTRAMUSCULAR | Status: DC | PRN
  Administered 2020-09-01: 5000 [IU] via INTRAVENOUS

## 2020-09-01 MED ORDER — MIDAZOLAM HCL 2 MG/2ML IJ SOLN
INTRAMUSCULAR | Status: DC | PRN
  Administered 2020-09-01: 1 mg
  Administered 2020-09-01: 2 mg via INTRAVENOUS
  Administered 2020-09-01: 2 mg
  Administered 2020-09-01: 1 mg

## 2020-09-01 MED ORDER — METHYLPREDNISOLONE SODIUM SUCC 125 MG IJ SOLR: 125.0000 mg | Freq: Once | INTRAMUSCULAR | Status: DC | PRN

## 2020-09-01 MED ORDER — ASPIRIN EC 81 MG PO TBEC
81.0000 mg | DELAYED_RELEASE_TABLET | Freq: Every day | ORAL | Status: DC
  Administered 2020-09-02 – 2020-09-05 (×4): 81 mg via ORAL
  Filled 2020-09-01 (×4): qty 1

## 2020-09-01 MED ORDER — SODIUM CHLORIDE 0.9% FLUSH
3.0000 mL | Freq: Two times a day (BID) | INTRAVENOUS | Status: DC
  Administered 2020-09-01 – 2020-09-05 (×5): 3 mL via INTRAVENOUS

## 2020-09-01 MED ORDER — MORPHINE SULFATE (PF) 4 MG/ML IV SOLN: 2.0000 mg | INTRAVENOUS | Status: DC | PRN

## 2020-09-01 MED ORDER — HYDROMORPHONE HCL 1 MG/ML IJ SOLN: 1.0000 mg | Freq: Once | INTRAMUSCULAR | Status: DC | PRN

## 2020-09-01 MED ORDER — SODIUM CHLORIDE 0.9 % IV SOLN: 250.0000 mL | INTRAVENOUS | Status: DC | PRN

## 2020-09-01 MED ORDER — CEFAZOLIN SODIUM-DEXTROSE 1-4 GM/50ML-% IV SOLN
INTRAVENOUS | Status: AC
  Administered 2020-09-01: 1 g via INTRAVENOUS
  Filled 2020-09-01: qty 50

## 2020-09-01 MED ORDER — SODIUM CHLORIDE 0.9 % IV SOLN: INTRAVENOUS | Status: DC

## 2020-09-01 MED ORDER — FENTANYL CITRATE (PF) 100 MCG/2ML IJ SOLN
INTRAMUSCULAR | Status: DC | PRN
  Administered 2020-09-01: 25 ug
  Administered 2020-09-01: 50 ug via INTRAVENOUS
  Administered 2020-09-01 (×2): 50 ug

## 2020-09-01 MED ORDER — CEFAZOLIN SODIUM-DEXTROSE 1-4 GM/50ML-% IV SOLN: 1.0000 g | Freq: Once | INTRAVENOUS | Status: AC

## 2020-09-01 MED ORDER — MIDAZOLAM HCL 5 MG/5ML IJ SOLN
INTRAMUSCULAR | Status: AC
  Filled 2020-09-01: qty 5

## 2020-09-01 MED ORDER — ONDANSETRON HCL 4 MG/2ML IJ SOLN: 4.0000 mg | Freq: Four times a day (QID) | INTRAMUSCULAR | Status: DC | PRN

## 2020-09-01 MED ORDER — SODIUM CHLORIDE 0.9 % IV SOLN: INTRAVENOUS | Status: AC

## 2020-09-01 MED ORDER — COLLAGENASE 250 UNIT/GM EX OINT
TOPICAL_OINTMENT | Freq: Every day | CUTANEOUS | Status: DC
  Filled 2020-09-01 (×2): qty 30

## 2020-09-01 SURGICAL SUPPLY — 25 items
BALLN LUTONIX 018 4X80X130 (BALLOONS) ×3
BALLN LUTONIX 018 5X40X130 (BALLOONS) ×6
BALLOON LUTONIX 018 4X80X130 (BALLOONS) IMPLANT
BALLOON LUTONIX 018 5X40X130 (BALLOONS) IMPLANT
CATH ANGIO 5F PIGTAIL 65CM (CATHETERS) ×2 IMPLANT
CATH VERT 5FR 125CM (CATHETERS) ×2 IMPLANT
CATH VS15FR (CATHETERS) ×2 IMPLANT
DEVICE PRESTO INFLATION (MISCELLANEOUS) ×2 IMPLANT
DEVICE STARCLOSE SE CLOSURE (Vascular Products) ×2 IMPLANT
DEVICE TORQUE .025-.038 (MISCELLANEOUS) ×2 IMPLANT
GLIDECATH 4FR STR (CATHETERS) ×2 IMPLANT
GLIDEWIRE ADV .035X260CM (WIRE) ×2 IMPLANT
GLIDEWIRE ANGLED SS 035X260CM (WIRE) ×2 IMPLANT
GUIDEWIRE ANGLED .035X260CM (WIRE) ×2 IMPLANT
NDL ENTRY 21GA 7CM ECHOTIP (NEEDLE) IMPLANT
NEEDLE ENTRY 21GA 7CM ECHOTIP (NEEDLE) ×3 IMPLANT
PACK ANGIOGRAPHY (CUSTOM PROCEDURE TRAY) ×3 IMPLANT
SET INTRO CAPELLA COAXIAL (SET/KITS/TRAYS/PACK) ×2 IMPLANT
SHEATH BRITE TIP 5FRX11 (SHEATH) ×2 IMPLANT
SHEATH HIGHFLEX ANSEL 6FRX55 (SHEATH) ×2 IMPLANT
STENT LIFESTENT 5F 5X40X135 (Permanent Stent) ×2 IMPLANT
SYR MEDRAD MARK 7 150ML (SYRINGE) ×2 IMPLANT
TUBING CONTRAST HIGH PRESS 72 (TUBING) ×2 IMPLANT
WIRE G V18X300CM (WIRE) ×2 IMPLANT
WIRE J 3MM .035X145CM (WIRE) ×2 IMPLANT

## 2020-09-01 NOTE — Interval H&P Note (Signed)
History and Physical Interval Note:  09/01/2020 9:25 AM  Brooke Hopkins  has presented today for surgery, with the diagnosis of Atherosclerotic Disease With Ulceration.  The various methods of treatment have been discussed with the patient and family. After consideration of risks, benefits and other options for treatment, the patient has consented to  Procedure(s): Lower Extremity Angiography (Left) as a surgical intervention.  The patient's history has been reviewed, patient examined, no change in status, stable for surgery.  I have reviewed the patient's chart and labs.  Questions were answered to the patient's satisfaction.     Hortencia Pilar

## 2020-09-01 NOTE — Progress Notes (Signed)
PROGRESS NOTE    Patient: Brooke Hopkins                            PCP: Leonel Ramsay, MD                    DOB: 10/19/1951            DOA: 08/29/2020 [id number]             DOS: 09/01/2020, 11:45 AM   LOS: 3 days   Date of Service: The patient was seen and examined on 09/01/2020  Subjective:   The patient was seen and examined this morning, stable no acute distress Afebrile normotensive no issues overnight  Status post angiogram -angioplasty and stent placement on her left popliteal artery, angioplasty of left anterior tibial artery, and revascularization by vascular team  Tolerated procedure well -surgery to follow for further debridement and wound care  Husband present at bedside findings were discussed in detail, agreeable to home health patient stable    Brief Narrative:   Brooke Hopkins is an 69 y.o. female with PMH significant for HTN, DM 2, cerebrovascular disease status post CVA and CKD who states she was in her usual state of relatively poor health, she is bedbound and dependent on [place] lift since her stroke, until 2 weeks ago when she developed a quarter shaped ulcer in the back of her left calf... Which has been progressively getting worse, foul-smelling, draining.   As noted above she is bedbound and is unable to move her leg since her stroke.    ED Course:   The patient was to be afebrile.  She was extremely hypertensive at 256/111.  She was noted to have a large foul-smelling ulcer.  Laboratory data were notable for no leukocytosis with a white count of 9.3 and a lactic acid of 1.8, anion gap 13.  Blood sugar was reasonably controlled at 112.  Plain film showed no evidence of osteomyelitis and sed rate was 47.  General surgery was consulted who noted she would need debridement however they were concerned that she may not have enough vascular flow to heal an extensive debridement.  Full consult is to follow.   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 ([place])   Wound infection   Essential hypertension   Type 2 diabetes mellitus with foot ulcer ([place])    Rapidly progressive in left lower extremity /peripheral vascular disease -Progressively worsening wound in 2 weeks, draining, foul-smelling -Vascular consulted 09/01/2020 status post left lower extremity angioplasty and stent placement in left popliteal artery-angioplasty of left anterior tibial artery -Vascular initiated aspirin Plavix statins  General surgery consulted, cleared by vascular for further debridement and wound care  -Follow with cultures, anticipating intraoperative wound cultures.  Erythema surrounding decubitus ulcer/cellulitis -We will continue IV antibiotics of vancomycin and Zosyn -Follow cultures >>> no growth to date -Continue wound care -Appreciate general surgery and vascular follow-up regarding further debridement   Elevated troponin -Remained flat, no chest pain or EKG changes -Monitoring on telemetry closely   Pain control -We will continue to titrate medication for better pain control -Stable on as needed hydrocodone   HTN -Accelerated hypertension, home medication resumed: Hydralazine, lisinopril, clonidine patch As needed hydralazine IV,  -Remained stable   DM2 -Diabetic diet -Has been n.p.o. -We will check blood sugar  q. ACH S while n.p.o. every 6 hours with SSI coverage  Severe debility/bedbound -in the setting of previous stroke -Continue antiplatelet therapy, statins -PT/OT for evaluation -Husband is willing to take patient back home but will appreciate if home health can be set up   Other information:   DVT prophylaxis: Enoxaparin ordered. Code Status: Full Family Communication: Patient's husband was at bedside.. Plan of care was discussed Disposition Plan: TBD Consults called: General  surgery/vascular Admission status: Inpatient     Admission status:    Status is: Inpatient  Remains inpatient appropriate because:Inpatient level of care appropriate due to severity of illness   Dispo: The patient is from: Home              Anticipated d/c is to: Home likely with home health              Anticipated d/c date is: 3 days              Patient currently is not medically stable to d/c.        Procedures:   - Arteriogram and revascularization 09/01/2020 status post left lower extremity angioplasty and stent placement in left popliteal artery-angioplasty of left anterior tibial artery  Antimicrobials:  Anti-infectives (From admission, onward)   Start     Dose/Rate Route Frequency Ordered Stop   09/01/20 0815  ceFAZolin (ANCEF) IVPB 1 g/50 mL premix       Note to Pharmacy: To be given in specials   1 g 100 mL/hr over 30 Minutes Intravenous  Once 09/01/20 0805 09/01/20 1124   08/31/20 0600  vancomycin (VANCOCIN) IVPB 1000 mg/200 mL premix  Status:  Discontinued        1,000 mg 200 mL/hr over 60 Minutes Intravenous Every 36 hours 08/30/20 1558 08/30/20 1627   08/30/20 1800  vancomycin (VANCOCIN) IVPB 1000 mg/200 mL premix  Status:  Discontinued        1,000 mg 200 mL/hr over 60 Minutes Intravenous Every 24 hours 08/29/20 1725 08/30/20 1558   08/30/20 1800  [MAR Hold]  linezolid (ZYVOX) IVPB 600 mg        (MAR Hold since Fri 09/01/2020 at [facility].Hold Reason: Transfer to a Procedural area.)   600 mg 300 mL/hr over 60 Minutes Intravenous Every 12 hours 08/30/20 1627     08/29/20 1800  [MAR Hold]  piperacillin-tazobactam (ZOSYN) IVPB 3.375 g        (MAR Hold since Fri 09/01/2020 at 0803.Hold Reason: Transfer to a Procedural area.)   3.375 g 12.5 mL/hr over 240 Minutes Intravenous Every 8 hours 08/29/20 1717 09/01/20 2159   08/29/20 1800  vancomycin (VANCOREADY) IVPB 2000 mg/400 mL        2,000 mg 200 mL/hr over 120 Minutes Intravenous  Once 08/29/20 1718 08/29/20 2020        Medication:  . aspirin EC  81 mg Oral Daily  . [MAR Hold] atorvastatin  80 mg Oral q1800  . [MAR Hold] cloNIDine  0.2 mg Transdermal Weekly  . [MAR Hold] clopidogrel  75 mg Oral Daily  . collagenase   Topical Daily  . [MAR Hold] enoxaparin (LOVENOX) injection  40 mg Subcutaneous Q24H  . fentaNYL      . fentaNYL      . heparin sodium (porcine)      . [MAR Hold] hydrALAZINE  25 mg Oral Q8H  . [MAR Hold] insulin aspart  0-20 Units Subcutaneous TID WC  . [MAR Hold] lisinopril  40 mg Oral Daily  .  midazolam      . neomycin-bacitracin-polymyxin      . [MAR Hold] sodium chloride flush  3 mL Intravenous Q12H  . sodium chloride flush  3 mL Intravenous Q12H    [MAR Hold] sodium chloride, sodium chloride, [MAR Hold] bisacodyl, [MAR Hold] HYDROcodone-acetaminophen, HYDROmorphone (DILAUDID) injection, morphine injection, [MAR Hold] ondansetron **OR** [MAR Hold] ondansetron (ZOFRAN) IV, ondansetron (ZOFRAN) IV, ondansetron (ZOFRAN) IV, [MAR Hold] polyethylene glycol, [MAR Hold] sodium chloride flush, sodium chloride flush   Objective:   Vitals:   09/01/20 1103 09/01/20 1115 09/01/20 1130 09/01/20 1140  BP: (!) 174/59 (!) 166/56 (!) 191/67 (!) 182/65  Pulse: 73 71 74 75  Resp: 20 19 20 19   Temp:      TempSrc:      SpO2: 94% 94% 95% 95%  Weight:      Height:        Intake/Output Summary (Last 24 hours) at 09/01/2020 1145 Last data filed at 09/01/2020 0529 Gross per 24 hour  Intake 1702.25 ml  Output 350 ml  Net 1352.25 ml   Filed Weights   08/29/20 1118  Weight: 99.8 kg     Examination:     Physical Exam:   General:  Alert, oriented, cooperative, no distress; bedbound at baseline  HEENT:  Normocephalic, PERRL, otherwise with in Normal limits   Neuro:  CNII-XII intact. , normal motor and sensation, reflexes intact   Lungs:   Clear to auscultation BL, Respirations unlabored, no wheezes / crackles  Cardio:    S1/S2, RRR, No murmure, No Rubs or Gallops   Abdomen:   Soft,  non-tender, bowel sounds active all four quadrants,  no guarding or peritoneal signs.  Muscular skeletal:   Not ambulating at baseline, generalized weaknesses Limited exam - in bed, able to move all 4 extremities, Normal strength,  +1 left lower extremity pulses,  symmetric, +2 pitting edema  Skin:  Dry, warm to touch, negative for any Rashes, left lower extremity open wounds  Wounds: Left lower extremity open wounds for picture below eschar, erythema-improving  please see nursing documentation              Left Lower Leg wound (08/29/2020):    ------------------------------------------------------------------------------------------------------------------------------------------    LABs:  CBC Latest Ref Rng & Units 09/01/2020 08/31/2020 08/30/2020  WBC 4.0 - 10.5 K/uL 9.6 8.3 9.4  Hemoglobin 12.0 - 15.0 g/dL 9.7(L) 10.2(L) 11.0(L)  Hematocrit 36 - 46 % 29.6(L) 30.7(L) 34.4(L)  Platelets 150 - 400 K/uL 282 285 312   CMP Latest Ref Rng & Units 09/01/2020 08/31/2020 08/30/2020  Glucose 70 - 99 mg/dL 128(H) 115(H) 143(H)  BUN 8 - 23 mg/dL 25(H) 27(H) 25(H)  Creatinine 0.44 - 1.00 mg/dL 1.48(H) 1.50(H) 1.78(H)  Sodium 135 - 145 mmol/L 137 139 139  Potassium 3.5 - 5.1 mmol/L 3.7 3.5 3.8  Chloride 98 - 111 mmol/L 106 107 103  CO2 22 - 32 mmol/L 28 23 26   Calcium 8.9 - 10.3 mg/dL 7.7(L) 8.1(L) 8.5(L)  Total Protein 6.5 - 8.1 g/dL - - -  Total Bilirubin 0.3 - 1.2 mg/dL - - -  Alkaline Phos 38 - 126 U/L - - -  AST 15 - 41 U/L - - -  ALT 0 - 44 U/L - - -       Micro Results Recent Results (from the past 240 hour(s))  SARS Coronavirus 2 by RT PCR (hospital order, performed in [facility] hospital lab) Nasopharyngeal Nasopharyngeal Swab     Status: None  Collection Time: 08/29/20 11:25 AM   Specimen: Nasopharyngeal Swab  Result Value Ref Range Status   SARS Coronavirus 2 NEGATIVE NEGATIVE Final    Comment: (NOTE) SARS-CoV-2 target nucleic acids are NOT DETECTED.  The SARS-CoV-2 RNA  is generally detectable in upper and lower respiratory specimens during the acute phase of infection. The lowest concentration of SARS-CoV-2 viral copies this assay can detect is 250 copies / mL. A negative result does not preclude SARS-CoV-2 infection and should not be used as the sole basis for treatment or other patient management decisions.  A negative result may occur with improper specimen collection / handling, submission of specimen other than nasopharyngeal swab, presence of viral mutation(s) within the areas targeted by this assay, and inadequate number of viral copies (<250 copies / mL). A negative result must be combined with clinical observations, patient history, and epidemiological information.  Fact Sheet for Patients:   StrictlyIdeas.no  Fact Sheet for Healthcare Providers: BankingDealers.co.za  This test is not yet approved or  cleared by the [place] FDA and has been authorized for detection and/or diagnosis of SARS-CoV-2 by FDA under an Emergency Use Authorization (EUA).  This EUA will remain in effect (meaning this test can be used) for the duration of the COVID-19 declaration under Section 564(b)(1) of the Act, 21 U.S.C. section 360bbb-3(b)(1), unless the authorization is terminated or revoked sooner.  Performed at [facility], [address] [place], [place] [address]   Blood culture (routine x 2)     Status: None (Preliminary result)   Collection Time: 08/29/20 11:25 AM   Specimen: BLOOD  Result Value Ref Range Status   Specimen Description BLOOD RIGHT ANTECUBITAL  Final   Special Requests   Final    BOTTLES DRAWN AEROBIC AND ANAEROBIC Blood Culture adequate volume   Culture   Final    NO GROWTH 3 DAYS Performed at [facility], [address] [place], [place] [address]    Report Status PENDING  Incomplete  Blood culture (routine x 2)     Status: None (Preliminary result)    Collection Time: 08/29/20 11:25 AM   Specimen: BLOOD  Result Value Ref Range Status   Specimen Description BLOOD BLOOD LEFT FOREARM  Final   Special Requests   Final    BOTTLES DRAWN AEROBIC AND ANAEROBIC Blood Culture adequate volume   Culture   Final    NO GROWTH 3 DAYS Performed at [facility], [address], [place], [place] [address]    Report Status PENDING  Incomplete    Radiology Reports PERIPHERAL VASCULAR CATHETERIZATION  Result Date: 09/01/2020 See Op note  DG Tibia/Fibula Left Port  Result Date: 08/29/2020 CLINICAL DATA:  Foot wound infection of the anterior distal tibia-fibula, left heel, and left calf EXAM: LEFT FOOT - 2 VIEW; PORTABLE LEFT TIBIA AND FIBULA - 2 VIEW COMPARISON:  X-ray 10/13/2015 FINDINGS: Bones are markedly demineralized. There is a chronic appearing fracture deformity of the distal tibial metaphysis with varus angulation. 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 No knee joint effusion. No acute fracture is identified. No evidence of cortical destruction or periostitis to suggest osteomyelitis. Soft tissue ulcerations at the posterolateral aspect of the calf and plantar heel. Marked soft tissue swelling of the foot. Severe extensive atherosclerotic calcifications. IMPRESSION: 1. Soft tissue ulcerations of the calf and plantar heel. No radiographic evidence of osteomyelitis. 2. 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 3. Marked soft tissue swelling of the foot. 4. Chronic  appearing fracture deformity of the distal tibial metaphysis with varus angulation. Electronically Signed   By: Davina Poke D.O.   On: 08/29/2020 13:16   DG Foot 2 Views Left  Result Date: 08/29/2020 CLINICAL DATA:  Foot wound infection of the anterior distal tibia-fibula, left heel, and left calf EXAM: LEFT FOOT - 2 VIEW; PORTABLE LEFT TIBIA AND FIBULA - 2 VIEW COMPARISON:  X-ray 10/13/2015 FINDINGS: Bones are markedly demineralized.  There is a chronic appearing fracture deformity of the distal tibial metaphysis with varus angulation. 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 No knee joint effusion. No acute fracture is identified. No evidence of cortical destruction or periostitis to suggest osteomyelitis. Soft tissue ulcerations at the posterolateral aspect of the calf and plantar heel. Marked soft tissue swelling of the foot. Severe extensive atherosclerotic calcifications. IMPRESSION: 1. Soft tissue ulcerations of the calf and plantar heel. No radiographic evidence of osteomyelitis. 2. 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 3. Marked soft tissue swelling of the foot. 4. Chronic appearing fracture deformity of the distal tibial metaphysis with varus angulation. Electronically Signed   By: Davina Poke D.O.   On: 08/29/2020 13:16    SIGNED: Deatra James, MD, FACP, FHM. [facility],  Pager (please use [email or web address] to page/text)  If 7PM-7AM, please contact night-coverage [email or web address], Password [facility] 09/01/2020, 11:45 AM

## 2020-09-01 NOTE — Op Note (Signed)
[facility] Percutaneous Study/Intervention Procedural Note   Date of Surgery: 09/01/2020  Surgeon:  Katha Cabal, MD.  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 Introduction catheter into left lower extremity 3rd order catheter placement  2. Contrast injection left lower extremity for distal runoff   3. Percutaneous transluminal angioplasty and stent placement left popliteal artery 4. Percutaneous transluminal angioplasty of the left anterior tibial artery.             5.  Star close closure right common femoral arteriotomy  Anesthesia: Conscious sedation was administered under my direct supervision by the interventional radiology RN. IV Versed plus fentanyl were utilized. Continuous ECG, pulse oximetry and blood pressure was monitored throughout the entire procedure.  Conscious sedation was for a total of 69 minutes.  Sheath: 6 [place] Ansell high flex right common femoral retrograde  Contrast: 75 cc  Fluoroscopy Time: 13.1 minutes  Indications: Brooke Hopkins presents with ulceration and gangrene of the left lower extremity.  She has nonpalpable pulses.  She is therefore undergoing angiography with the hope for intervention for limb salvage.  The risks and benefits are reviewed all questions answered patient agrees to proceed.  Procedure: Brooke Hopkins is a 69 y.o. y.o. female who was identified and appropriate procedural time out was performed. The patient was then placed supine on the table and prepped and draped in the usual sterile fashion.   Ultrasound was placed in the sterile sleeve and the right groin was evaluated the right common femoral artery was echolucent and pulsatile indicating patency.  Image was recorded for the  permanent record and under real-time visualization a microneedle was inserted into the common femoral artery microwire followed by a micro-sheath.  A J-wire was then advanced through the micro-sheath and a  5 [place] sheath was then inserted over a J-wire. J-wire was then advanced and a 5 French pigtail catheter was positioned at the level of T12. AP projection of the aorta was then obtained. Pigtail catheter was repositioned to above the bifurcation and a RAO view of the pelvis was obtained.  Subsequently a VS 1 catheter with the floppy angle Glidewire was used to cross the aortic bifurcation the catheter wire were advanced down into the profunda femoris.  Next the floppy glide was exchanged for an advantage wire and the glide catheter removed a 6 [place] Ansell high flex is then advanced up and over the bifurcation position with the tip in the proximal common femoral.  LAO view of the femoral bifurcation was then obtained and subsequently the wire was reintroduced and the Kumpe catheter negotiated into the SFA representing third order catheter placement. Distal runoff was then performed.  Diagnostic interpretation: The abdominal aorta is opacified with a bolus injection contrast.  The abdominal aorta is widely patent with minimal atherosclerotic changes.  The common internal and external iliac arteries are widely patent.  The left common femoral profunda femoris superficial femoral are all diffusely diseased but there are no hemodynamically significant stenosis.  The popliteal artery occludes abruptly at the level of the tibial plateau and the trifurcation is completely occluded.  There is reconstitution of the proximal anterior tibial which appears to be the best tibial artery.  The peroneal does show up more distally posterior tibial is nonvisualized.  5000 units of heparin was then given and allowed to circulate and advanced up and over the  bifurcation and positioned in the femoral artery  KMP  catheter  and stiff angle Glidewire were then negotiated down into the distal popliteal.  Magnified imaging was obtained of the occlusion.  A Kumpe catheter and the advantage wire were then used to cross the occlusion and the wire negotiated into the anterior tibial followed by the Kumpe catheter.  Hand-injection contrast verified intraluminal positioning.  The wire was then exchanged for a V 18 wire.  A 4 mm x 100 mm Lutonix was then used to angioplasty the anterior tibial extending into the popliteal.  Inflation was to 8 atm for 1 minute.  Follow-up imaging demonstrated recanalization of the occlusion there is greater than 50% residual stenosis within the distal popliteal however the anterior tibial appears to be widely patent with less than 5% residual stenosis throughout its proximal course.  I then advanced a 5 mm x 40 mm Lutonix drug-eluting balloon across the lesion in the popliteal and inflated this to 6 atm for 1 minute.  Follow-up imaging demonstrated the stenosis was persistent and therefore a 5 mm x 40 mm life stent was deployed and postdilated with a 5 mm x 40 Lutonix drug-eluting balloon.  Follow-up imaging now demonstrated wide patency of the popliteal with less than 5% residual stenosis and preservation of the anterior tibial runoff anterior tibial in the treated area appears to have less than 5% residual stenosis more more distally the anterior tibial is patent there appears to be a 20% stenosis in its midportion but this does not appear to be hemodynamically significant.  There is filling of the dorsalis pedis.  There is reconstitution of the peroneal as well.    After review of these images the sheath is pulled into the right external iliac oblique of the common femoral is obtained and a Star close device deployed. There no immediate complications.   Findings:  The abdominal aorta is opacified with a bolus injection contrast.  The abdominal aorta is widely patent with minimal atherosclerotic changes.   The common internal and external iliac arteries are widely patent.  The left common femoral profunda femoris superficial femoral are all diffusely diseased but there are no hemodynamically significant stenosis.  The left popliteal artery occludes abruptly at the level of the tibial plateau and the trifurcation is completely occluded.  There is reconstitution of the proximal left anterior tibial which appears to be the best tibial artery.  The peroneal does show up more distally posterior tibial is nonvisualized.  Following angioplasty left anterior tibial now is in-line flow and looks quite nice with less than 5% residual stenosis. Angioplasty of the left popliteal artery distally there is a greater than 50% residual stenosis and therefore a life stent is deployed and postdilated to 5 mm.  Following this there is less than 5% residual stenosis   Summary: Successful recanalization left lower extremity for limb salvage    Disposition: Patient was taken to the recovery room in stable condition having tolerated the procedure well.  Alhassan Everingham, Dolores Lory 09/01/2020,11:04 AM

## 2020-09-01 NOTE — Consult Note (Signed)
Pharmacy Antibiotic Note  Brooke Hopkins is a 69 y.o. female with medical history including HTN, T2DM, hx CVA, and CKD admitted on 08/29/2020 with expanding ulcer on back of left calf.  Pharmacy has been consulted for Zosyn dosing. Per consult instructions, duration of therapy is defined at 3 days.  -Vancomycin changed to Linezolid 9/2  Plan: Zosyn 3.375g IV q8h (4 hour infusion).     Height: 5\' 6"  (167.6 cm) Weight: 99.8 kg (220 lb) IBW/kg (Calculated) : 59.3  Temp (24hrs), Avg:98.7 F (37.1 C), Min:98.4 F (36.9 C), Max:98.9 F (37.2 C)  Recent Labs  Lab 08/29/20 1125 08/30/20 0504 08/31/20 0410 09/01/20 0444  WBC 9.3 9.4 8.3 9.6  CREATININE 1.21* 1.78* 1.50* 1.48*  LATICACIDVEN 1.8  --   --   --     Estimated Creatinine Clearance: 42.2 mL/min (A) (by C-G formula based on SCr of 1.48 mg/dL (H)).    No Known Allergies  Antimicrobials this admission: Zosyn 8/31 >>  Vancomycin 8/31 >> 9/1 Zyvox 9/2 >>  Dose adjustments this admission: n/a  Microbiology results: 8/31 BCx: pending  Thank you for allowing pharmacy to be a part of this patient's care.  Darry Kelnhofer A 09/01/2020 12:48 PM

## 2020-09-02 LAB — CBC WITH DIFFERENTIAL/PLATELET
Abs Immature Granulocytes: 0.04 10*3/uL (ref 0.00–0.07)
Basophils Absolute: 0.1 10*3/uL (ref 0.0–0.1)
Basophils Relative: 1 %
Eosinophils Absolute: 0.6 10*3/uL — ABNORMAL HIGH (ref 0.0–0.5)
Eosinophils Relative: 8 %
HCT: 29.1 % — ABNORMAL LOW (ref 36.0–46.0)
Hemoglobin: 9.7 g/dL — ABNORMAL LOW (ref 12.0–15.0)
Immature Granulocytes: 1 %
Lymphocytes Relative: 20 %
Lymphs Abs: 1.7 10*3/uL (ref 0.7–4.0)
MCH: 27.3 pg (ref 26.0–34.0)
MCHC: 33.3 g/dL (ref 30.0–36.0)
MCV: 82 fL (ref 80.0–100.0)
Monocytes Absolute: 0.6 10*3/uL (ref 0.1–1.0)
Monocytes Relative: 7 %
Neutro Abs: 5.5 10*3/uL (ref 1.7–7.7)
Neutrophils Relative %: 63 %
Platelets: 256 10*3/uL (ref 150–400)
RBC: 3.55 MIL/uL — ABNORMAL LOW (ref 3.87–5.11)
RDW: 13.4 % (ref 11.5–15.5)
WBC: 8.6 10*3/uL (ref 4.0–10.5)
nRBC: 0 % (ref 0.0–0.2)

## 2020-09-02 LAB — GLUCOSE, CAPILLARY
Glucose-Capillary: 122 mg/dL — ABNORMAL HIGH (ref 70–99)
Glucose-Capillary: 155 mg/dL — ABNORMAL HIGH (ref 70–99)
Glucose-Capillary: 157 mg/dL — ABNORMAL HIGH (ref 70–99)
Glucose-Capillary: 147 mg/dL — ABNORMAL HIGH (ref 70–99)

## 2020-09-02 LAB — BASIC METABOLIC PANEL
Anion gap: 8 (ref 5–15)
BUN: 24 mg/dL — ABNORMAL HIGH (ref 8–23)
CO2: 23 mmol/L (ref 22–32)
Calcium: 7.8 mg/dL — ABNORMAL LOW (ref 8.9–10.3)
Chloride: 105 mmol/L (ref 98–111)
Creatinine, Ser: 1.49 mg/dL — ABNORMAL HIGH (ref 0.44–1.00)
GFR calc Af Amer: 41 mL/min — ABNORMAL LOW (ref >60)
GFR calc non Af Amer: 35 mL/min — ABNORMAL LOW (ref >60)
Glucose, Bld: 137 mg/dL — ABNORMAL HIGH (ref 70–99)
Potassium: 3.5 mmol/L (ref 3.5–5.1)
Sodium: 136 mmol/L (ref 135–145)

## 2020-09-02 MED ORDER — LISINOPRIL 2.5 MG PO TABS
2.5000 mg | ORAL_TABLET | Freq: Every day | ORAL | Status: DC
  Administered 2020-09-02 – 2020-09-03 (×2): 2.5 mg via ORAL
  Filled 2020-09-02 (×3): qty 1

## 2020-09-02 MED ORDER — HYDRALAZINE HCL 50 MG PO TABS
50.0000 mg | ORAL_TABLET | Freq: Three times a day (TID) | ORAL | Status: DC
  Administered 2020-09-02 – 2020-09-05 (×10): 50 mg via ORAL
  Filled 2020-09-02 (×10): qty 1

## 2020-09-02 NOTE — Progress Notes (Signed)
PROGRESS NOTE    Patient: Brooke Hopkins                            PCP: Leonel Ramsay, MD                    DOB: [birth year]            DOA: 08/29/2020 [id number]:[id number]             DOS: 09/02/2020, 11:59 AM   LOS: 4 days   Date of Service: The patient was seen and examined on 09/02/2020  Subjective:   The patient was seen and examined this morning, stable no acute distress.  Hemodynamically stable.  No issues overnight.  Postop day #1 Status post angiogram -angioplasty and stent placement on her left popliteal artery, angioplasty of left anterior tibial artery, and revascularization by vascular team  Tolerated procedure well -surgery to follow for further debridement and wound care  Husband present at bedside findings were discussed in detail, agreeable to home health patient stable    Brief Narrative:   Brooke Hopkins is an 69 y.o. female with PMH significant for HTN, DM 2, cerebrovascular disease status post CVA and CKD who states she was in her usual state of relatively poor health, she is bedbound and dependent on [place] lift since her stroke, until 2 weeks ago when she developed a quarter shaped ulcer in the back of her left calf... Which has been progressively getting worse, foul-smelling, draining.   As noted above she is bedbound and is unable to move her leg since her stroke.    ED Course:   The patient was to be afebrile.  She was extremely hypertensive at 256/111.  She was noted to have a large foul-smelling ulcer.  Laboratory data were notable for no leukocytosis with a white count of 9.3 and a lactic acid of 1.8, anion gap 13.  Blood sugar was reasonably controlled at 112.  Plain film showed no evidence of osteomyelitis and sed rate was 47.  General surgery was consulted who noted she would need debridement however they were concerned that she may not have enough vascular flow to heal an extensive debridement.  Full consult is to follow.   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 ([place])   Wound infection   Essential hypertension   Type 2 diabetes mellitus with foot ulcer ([place])    Rapidly progressive in left lower extremity /peripheral vascular disease -Continue with wound care per wound care recommendations -Status post revascularization-patient tolerated well  -Progressively worsening wound in 2 weeks, draining, foul-smelling   Postop day #1 09/01/2020 status post left lower extremity angioplasty and stent placement in left popliteal artery-angioplasty of left anterior tibial artery -Vascular initiated aspirin Plavix statins  General surgery consulted, cleared by vascular for further debridement and wound care  -Follow with cultures, anticipating intraoperative wound cultures.  Erythema surrounding decubitus ulcer/cellulitis -We will continue IV antibiotics of vancomycin and Zosyn >>> has been switched to Zyvox, -Follow cultures >>> no growth to date -Continue wound care -Appreciate general surgery and vascular follow-up regarding further debridement   Elevated troponin -Remained flat, no chest pain or EKG changes -Monitoring on telemetry closely   Pain control -We will continue to titrate medication for better pain control -Stable on as needed hydrocodone   HTN -Accelerated  hypertension, home medication resumed: Hydralazine, lisinopril, clonidine patch As needed hydralazine IV,  Patient remained stable--- due to elevated creatinine, will reduce lisinopril dose   DM2 -Diabetic diet -Monitor blood sugars closely -We will check blood sugar q. ACH S while n.p.o. every 6 hours with SSI coverage  Severe debility/bedbound -in the setting of previous stroke -Continue antiplatelet therapy, statins -PT/OT for evaluation -Husband is willing to take patient back home but will appreciate if home health can be set  up   Other information:   DVT prophylaxis: Enoxaparin ordered. Code Status: Full Family Communication: Patient's husband was at bedside.. Plan of care was discussed Disposition Plan: Likely to be discharged home with home health 09/04/2020 Consults called: General surgery/vascular Admission status: Inpatient     Admission status:    Status is: Inpatient  Remains inpatient appropriate because:Inpatient level of care appropriate due to severity of illness   Dispo: The patient is from: Home              Anticipated d/c is to: Home likely with home health              Anticipated d/c date is: 1-2 days               Patient currently is not medically stable to d/c.        Procedures:   - Arteriogram and revascularization 09/01/2020 status post left lower extremity angioplasty and stent placement in left popliteal artery-angioplasty of left anterior tibial artery  Antimicrobials:  Anti-infectives (From admission, onward)   Start     Dose/Rate Route Frequency Ordered Stop   09/01/20 0815  ceFAZolin (ANCEF) IVPB 1 g/50 mL premix       Note to Pharmacy: To be given in specials   1 g 100 mL/hr over 30 Minutes Intravenous  Once 09/01/20 0805 09/01/20 1124   08/31/20 0600  vancomycin (VANCOCIN) IVPB 1000 mg/200 mL premix  Status:  Discontinued        1,000 mg 200 mL/hr over 60 Minutes Intravenous Every 36 hours 08/30/20 1558 08/30/20 1627   08/30/20 1800  vancomycin (VANCOCIN) IVPB 1000 mg/200 mL premix  Status:  Discontinued        1,000 mg 200 mL/hr over 60 Minutes Intravenous Every 24 hours 08/29/20 1725 08/30/20 1558   08/30/20 1800  linezolid (ZYVOX) IVPB 600 mg        600 mg 300 mL/hr over 60 Minutes Intravenous Every 12 hours 08/30/20 1627     08/29/20 1800  piperacillin-tazobactam (ZOSYN) IVPB 3.375 g        3.375 g 12.5 mL/hr over 240 Minutes Intravenous Every 8 hours 08/29/20 1717 09/02/20 0039   08/29/20 1800  vancomycin (VANCOREADY) IVPB 2000 mg/400 mL         2,000 mg 200 mL/hr over 120 Minutes Intravenous  Once 08/29/20 1718 08/29/20 2020       Medication:  . aspirin EC  81 mg Oral Daily  . atorvastatin  80 mg Oral q1800  . cloNIDine  0.2 mg Transdermal Weekly  . clopidogrel  75 mg Oral Daily  . collagenase   Topical Daily  . enoxaparin (LOVENOX) injection  40 mg Subcutaneous Q24H  . hydrALAZINE  25 mg Oral Q8H  . insulin aspart  0-20 Units Subcutaneous TID WC  . lisinopril  2.5 mg Oral Daily  . sodium chloride flush  3 mL Intravenous Q12H  . sodium chloride flush  3 mL Intravenous Q12H    sodium chloride, sodium  chloride, bisacodyl, HYDROcodone-acetaminophen, HYDROmorphone (DILAUDID) injection, morphine injection, ondansetron **OR** ondansetron (ZOFRAN) IV, ondansetron (ZOFRAN) IV, polyethylene glycol, sodium chloride flush, sodium chloride flush   Objective:   Vitals:   09/02/20 0521 09/02/20 0844 09/02/20 1141 09/02/20 1143  BP: (!) 151/45 (!) 181/59 (!) 171/50 (!) 156/56  Pulse: 70  76 77  Resp: 20  18   Temp: 98.2 F (36.8 C)  98.6 F (37 C)   TempSrc: Oral  Oral   SpO2: 97%  96%   Weight:      Height:        Intake/Output Summary (Last 24 hours) at 09/02/2020 1159 Last data filed at 09/02/2020 0900 Gross per 24 hour  Intake 120 ml  Output 325 ml  Net -205 ml   Filed Weights   08/29/20 1118  Weight: 99.8 kg     Examination:      Physical Exam:   General:  Alert, oriented, cooperative, no distress;   HEENT:  Normocephalic, PERRL, otherwise with in Normal limits   Neuro:  CNII-XII intact. , normal motor and sensation, reflexes intact   Lungs:   Clear to auscultation BL, Respirations unlabored, no wheezes / crackles  Cardio:    S1/S2, RRR, No murmure, No Rubs or Gallops   Abdomen:   Soft, non-tender, bowel sounds active all four quadrants,  no guarding or peritoneal signs.  Muscular skeletal:   Severe generalized weaknesses, bedbound, unable to move lower extremity or ambulate due to previous strokes,  debility  Limited exam - in bed, able to move all 2 extremities, reduced strength lower extremity  2+ pulses,  symmetric, +2 pitting edema  Skin:  Dry, warm to touch, negative for any Rashes, No open wounds      Wounds: Left lower extremity open wounds for picture below eschar, erythema-improving  please see nursing documentation              Left Lower Leg wound (08/29/2020):    ------------------------------------------------------------------------------------------------------------------------------------------    LABs:  CBC Latest Ref Rng & Units 09/02/2020 09/01/2020 08/31/2020  WBC 4.0 - 10.5 K/uL 8.6 9.6 8.3  Hemoglobin 12.0 - 15.0 g/dL 9.7(L) 9.7(L) 10.2(L)  Hematocrit 36 - 46 % 29.1(L) 29.6(L) 30.7(L)  Platelets 150 - 400 K/uL 256 282 285   CMP Latest Ref Rng & Units 09/02/2020 09/01/2020 08/31/2020  Glucose 70 - 99 mg/dL 137(H) 128(H) 115(H)  BUN 8 - 23 mg/dL 24(H) 25(H) 27(H)  Creatinine 0.44 - 1.00 mg/dL 1.49(H) 1.48(H) 1.50(H)  Sodium 135 - 145 mmol/L 136 137 139  Potassium 3.5 - 5.1 mmol/L 3.5 3.7 3.5  Chloride 98 - 111 mmol/L 105 106 107  CO2 22 - 32 mmol/L 23 28 23   Calcium 8.9 - 10.3 mg/dL 7.8(L) 7.7(L) 8.1(L)  Total Protein 6.5 - 8.1 g/dL - - -  Total Bilirubin 0.3 - 1.2 mg/dL - - -  Alkaline Phos 38 - 126 U/L - - -  AST 15 - 41 U/L - - -  ALT 0 - 44 U/L - - -       Micro Results Recent Results (from the past 240 hour(s))  SARS Coronavirus 2 by RT PCR (hospital order, performed in [facility] hospital lab) Nasopharyngeal Nasopharyngeal Swab     Status: None   Collection Time: 08/29/20 11:25 AM   Specimen: Nasopharyngeal Swab  Result Value Ref Range Status   SARS Coronavirus 2 NEGATIVE NEGATIVE Final    Comment: (NOTE) SARS-CoV-2 target nucleic acids are NOT DETECTED.  The SARS-CoV-2 RNA  is generally detectable in upper and lower respiratory specimens during the acute phase of infection. The lowest concentration of SARS-CoV-2 viral copies this assay  can detect is 250 copies / mL. A negative result does not preclude SARS-CoV-2 infection and should not be used as the sole basis for treatment or other patient management decisions.  A negative result may occur with improper specimen collection / handling, submission of specimen other than nasopharyngeal swab, presence of viral mutation(s) within the areas targeted by this assay, and inadequate number of viral copies (<250 copies / mL). A negative result must be combined with clinical observations, patient history, and epidemiological information.  Fact Sheet for Patients:   StrictlyIdeas.no  Fact Sheet for Healthcare Providers: BankingDealers.co.za  This test is not yet approved or  cleared by the [place] FDA and has been authorized for detection and/or diagnosis of SARS-CoV-2 by FDA under an Emergency Use Authorization (EUA).  This EUA will remain in effect (meaning this test can be used) for the duration of the COVID-19 declaration under Section 564(b)(1) of the Act, 21 U.S.C. section 360bbb-3(b)(1), unless the authorization is terminated or revoked sooner.  Performed at [facility], [address] [place], [place] [address]   Blood culture (routine x 2)     Status: None (Preliminary result)   Collection Time: 08/29/20 11:25 AM   Specimen: BLOOD  Result Value Ref Range Status   Specimen Description BLOOD RIGHT ANTECUBITAL  Final   Special Requests   Final    BOTTLES DRAWN AEROBIC AND ANAEROBIC Blood Culture adequate volume   Culture   Final    NO GROWTH 4 DAYS Performed at [facility], [address] [place], [place] [address]    Report Status PENDING  Incomplete  Blood culture (routine x 2)     Status: None (Preliminary result)   Collection Time: 08/29/20 11:25 AM   Specimen: BLOOD  Result Value Ref Range Status   Specimen Description BLOOD BLOOD LEFT FOREARM  Final   Special Requests   Final     BOTTLES DRAWN AEROBIC AND ANAEROBIC Blood Culture adequate volume   Culture   Final    NO GROWTH 4 DAYS Performed at [facility], [address] [place], [place] [address]    Report Status PENDING  Incomplete    Radiology Reports PERIPHERAL VASCULAR CATHETERIZATION  Result Date: 09/01/2020 See Op note  DG Tibia/Fibula Left Port  Result Date: 08/29/2020 CLINICAL DATA:  Foot wound infection of the anterior distal tibia-fibula, left heel, and left calf EXAM: LEFT FOOT - 2 VIEW; PORTABLE LEFT TIBIA AND FIBULA - 2 VIEW COMPARISON:  X-ray 10/13/2015 FINDINGS: Bones are markedly demineralized. There is a chronic appearing fracture deformity of the distal tibial metaphysis with varus angulation. 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 No knee joint effusion. No acute fracture is identified. No evidence of cortical destruction or periostitis to suggest osteomyelitis. Soft tissue ulcerations at the posterolateral aspect of the calf and plantar heel. Marked soft tissue swelling of the foot. Severe extensive atherosclerotic calcifications. IMPRESSION: 1. Soft tissue ulcerations of the calf and plantar heel. No radiographic evidence of osteomyelitis. 2. 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 3. Marked soft tissue swelling of the foot. 4. Chronic appearing fracture deformity of the distal tibial metaphysis with varus angulation. Electronically Signed   By: Davina Poke D.O.   On: 08/29/2020 13:16   DG Foot 2 Views Left  Result Date: 08/29/2020 CLINICAL DATA:  Foot  wound infection of the anterior distal tibia-fibula, left heel, and left calf EXAM: LEFT FOOT - 2 VIEW; PORTABLE LEFT TIBIA AND FIBULA - 2 VIEW COMPARISON:  X-ray 10/13/2015 FINDINGS: Bones are markedly demineralized. There is a chronic appearing fracture deformity of the distal tibial metaphysis with varus angulation. 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 No  knee joint effusion. No acute fracture is identified. No evidence of cortical destruction or periostitis to suggest osteomyelitis. Soft tissue ulcerations at the posterolateral aspect of the calf and plantar heel. Marked soft tissue swelling of the foot. Severe extensive atherosclerotic calcifications. IMPRESSION: 1. Soft tissue ulcerations of the calf and plantar heel. No radiographic evidence of osteomyelitis. 2. 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 3. Marked soft tissue swelling of the foot. 4. Chronic appearing fracture deformity of the distal tibial metaphysis with varus angulation. Electronically Signed   By: Davina Poke D.O.   On: 08/29/2020 13:16    SIGNED: Deatra James, MD, FACP, FHM. [facility],  Pager (please use [email or web address] to page/text)  If 7PM-7AM, please contact night-coverage [email or web address], Password [facility] 09/02/2020, 11:59 AM And vascular

## 2020-09-03 LAB — GLUCOSE, CAPILLARY
Glucose-Capillary: 150 mg/dL — ABNORMAL HIGH (ref 70–99)
Glucose-Capillary: 166 mg/dL — ABNORMAL HIGH (ref 70–99)
Glucose-Capillary: 140 mg/dL — ABNORMAL HIGH (ref 70–99)
Glucose-Capillary: 142 mg/dL — ABNORMAL HIGH (ref 70–99)

## 2020-09-03 LAB — BASIC METABOLIC PANEL
Anion gap: 7 (ref 5–15)
BUN: 22 mg/dL (ref 8–23)
CO2: 22 mmol/L (ref 22–32)
Calcium: 7.7 mg/dL — ABNORMAL LOW (ref 8.9–10.3)
Chloride: 108 mmol/L (ref 98–111)
Creatinine, Ser: 1.27 mg/dL — ABNORMAL HIGH (ref 0.44–1.00)
GFR calc Af Amer: 50 mL/min — ABNORMAL LOW (ref >60)
GFR calc non Af Amer: 43 mL/min — ABNORMAL LOW (ref >60)
Glucose, Bld: 137 mg/dL — ABNORMAL HIGH (ref 70–99)
Potassium: 3.1 mmol/L — ABNORMAL LOW (ref 3.5–5.1)
Sodium: 137 mmol/L (ref 135–145)

## 2020-09-03 LAB — CULTURE, BLOOD (ROUTINE X 2)
Culture: NO GROWTH
Culture: NO GROWTH
Special Requests: ADEQUATE
Special Requests: ADEQUATE

## 2020-09-03 LAB — MAGNESIUM: Magnesium: 1.5 mg/dL — ABNORMAL LOW (ref 1.7–2.4)

## 2020-09-03 MED ORDER — POTASSIUM CHLORIDE CRYS ER 20 MEQ PO TBCR
40.0000 meq | EXTENDED_RELEASE_TABLET | Freq: Once | ORAL | Status: AC
  Administered 2020-09-03: 40 meq via ORAL
  Filled 2020-09-03: qty 2

## 2020-09-03 NOTE — Progress Notes (Signed)
[facility] SURGICAL PROGRESS NOTE (cpt [phone])  Hospital Day(s): 5.   Interval History: Patient seen and examined, no acute events or new complaints overnight. Patient reports she still has some tenderness in the left lower extremity around the wound, denies fever, chills.  Current dressing being changed, with application of Santyl.  There is no evidence of infection present no wound, no surrounding erythema, no active or purulent drainage.  The eschar is soft and moist, there is further demarcation taking place in the wounds.  Review of Systems:  Constitutional: denies fever, chills  HEENT: denies cough or congestion  Respiratory: denies any shortness of breath  Cardiovascular: denies chest pain or palpitations  Gastrointestinal: denies abdominal pain, N/V/D Genitourinary: denies burning with urination or urinary frequency Musculoskeletal: + Weakness, bilateral LE, chronic Integumentary: + LLE Wound  Vital signs in last 24 hours: [min-max] current  Temp:  [98.5 F (36.9 C)-99.1 F (37.3 C)] 98.5 F (36.9 C) (09/05 1150) Pulse Rate:  [74-87] 79 (09/05 1150) Resp:  [16-20] 16 (09/05 1150) BP: (152-184)/(40-63) 178/63 (09/05 1150) SpO2:  [97 %-99 %] 99 % (09/05 1150)     Height: 5\' 6"  (167.6 cm) Weight: 99.8 kg BMI (Calculated): 35.53   Intake/Output last 2 shifts:  09/04 0701 - 09/05 0700 In: 120 [P.O.:120] Out: -    Physical Exam:  Constitutional: alert, cooperative and no distress  HENT: normocephalic without obvious abnormality  Eyes: PERRL, EOM's grossly intact and symmetric  Respiratory: breathing non-labored at rest  Cardiovascular: regular rate and sinus rhythm  Musculoskeletal: She has 2+ pitting edema to bilateral lower extremities, venous stasis changes to the bilateral lower extremities Integumentary: She has a large wound to the left lateral calf, wound bed is combination of eschar, in various stages of progression, no significant erythema or  drainage, no crepitus. She also have a smaller wound to the dorsal left foot at ankle joint, in a similar state.    Labs:  CBC Latest Ref Rng & Units 09/02/2020 09/01/2020 08/31/2020  WBC 4.0 - 10.5 K/uL 8.6 9.6 8.3  Hemoglobin 12.0 - 15.0 g/dL 9.7(L) 9.7(L) 10.2(L)  Hematocrit 36 - 46 % 29.1(L) 29.6(L) 30.7(L)  Platelets 150 - 400 K/uL 256 282 285   CMP Latest Ref Rng & Units 09/03/2020 09/02/2020 09/01/2020  Glucose 70 - 99 mg/dL 137(H) 137(H) 128(H)  BUN 8 - 23 mg/dL 22 24(H) 25(H)  Creatinine 0.44 - 1.00 mg/dL 1.27(H) 1.49(H) 1.48(H)  Sodium 135 - 145 mmol/L 137 136 137  Potassium 3.5 - 5.1 mmol/L 3.1(L) 3.5 3.7  Chloride 98 - 111 mmol/L 108 105 106  CO2 22 - 32 mmol/L 22 23 28   Calcium 8.9 - 10.3 mg/dL 7.7(L) 7.8(L) 7.7(L)  Total Protein 6.5 - 8.1 g/dL - - -  Total Bilirubin 0.3 - 1.2 mg/dL - - -  Alkaline Phos 38 - 126 U/L - - -  AST 15 - 41 U/L - - -  ALT 0 - 44 U/L - - -     Imaging studies: No new pertinent imaging studies   Assessment/Plan: (ICD-10's: T14.8XXA) 69 y.o. female with worsening of left lower extremity wound which I suspect is attributable to some degree of underlying vascular diease, complicated by pertinent comorbidities including morbid obesity, immobility secondary to CVA, uncontrolled HTN, and DM   -  I do believe she will benefit from continued enzymatic debridement of this left lateral leg wound.   - I do not appreciate any infection, which would warrant urgent excisional debridement  intervention.              - Local wound care with Santyl and wet-to-moist normal saline solution dressing, pressure offload, elevate extremity with pillow             - pain control prn          -Referral to wound care center.             - further management per primary service; we will sign off    All of the above findings and recommendations were discussed with the patient, patient's family (husband at bedside), and the medical team, and all of patient's and family's questions  were answered to their expressed satisfaction.  -- Ronny Bacon, M.D.  [facility] 09/03/2020, 12:55 PM

## 2020-09-03 NOTE — Progress Notes (Signed)
PROGRESS NOTE    Patient: Brooke Hopkins                            PCP: Leonel Ramsay, MD                    DOB: 07/06/1951            DOA: 08/29/2020 HYQ:[id number]             DOS: 09/03/2020, 12:35 PM   LOS: 5 days   Date of Service: The patient was seen and examined on 09/03/2020  Subjective:    Postop day #2 Status post angiogram -angioplasty and stent placement on her left popliteal artery, angioplasty of left anterior tibial artery, and revascularization by vascular team  The patient was seen and examined, remained stable. Blood pressure elevated this morning, 170/63, denies any headaches denies any chest pain or shortness of breath...  Satting 99% on room air  Husband concerned due to severe debility he is still willing to assist at home, open to home health. The patient and her husband has refused SNF.     Brief Narrative:   Brooke Hopkins is an 69 y.o. female with PMH significant for HTN, DM 2, cerebrovascular disease status post CVA and CKD who states she was in her usual state of relatively poor health, she is bedbound and dependent on Hoyer lift since her stroke, until 2 weeks ago when she developed a quarter shaped ulcer in the back of her left calf... Which has been progressively getting worse, foul-smelling, draining.   As noted above she is bedbound and is unable to move her leg since her stroke.    ED Course:   The patient was to be afebrile.  She was extremely hypertensive at 256/111.  She was noted to have a large foul-smelling ulcer.  Laboratory data were notable for no leukocytosis with a white count of 9.3 and a lactic acid of 1.8, anion gap 13.  Blood sugar was reasonably controlled at 112.  Plain film showed no evidence of osteomyelitis and sed rate was 47.  General surgery was consulted who noted she would need debridement however they were concerned that she may not have enough vascular flow to heal an extensive debridement.  Full  consult is to follow.   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 ([place])   Wound infection   Essential hypertension   Type 2 diabetes mellitus with foot ulcer ([place])    Rapidly progressive in left lower extremity /peripheral vascular disease -Patient remained stable post revascularization, continuing wound care   Postop day #2 09/01/2020 status post left lower extremity angioplasty and stent placement in left popliteal artery-angioplasty of left anterior tibial artery -Vascular initiated aspirin Plavix statins  General surgery, wound care team follow  -Blood cultures negative to date  Erythema surrounding decubitus ulcer/cellulitis -We will continue IV antibiotics of vancomycin and Zosyn >>> has been switched to Zyvox, Anticipating tapering down antibiotics -Follow cultures >>> no growth to date -Continue wound care -Appreciate general surgery and vascular follow-up regarding further debridement   Elevated troponin -Remained flat, no chest pain or EKG changes -Monitoring on telemetry closely   Pain control --Continue to titrate oral pain medication for optimal pain control -Stable on as needed hydrocodone   HTN -Remained hypertensive home medication resumed: Hydralazine, lisinopril,  clonidine patch As needed hydralazine IV,  Due to elevated creatinine, lisinopril dose was reduced Will increase hydralazine dose   DM2 -Diabetic diet -Monitor blood sugars closely -We will check blood sugar q. ACH S while n.p.o. every 6 hours with SSI coverage  Severe debility/bedbound -in the setting of previous stroke -Continue antiplatelet therapy, statins -PT/OT for evaluation -Husband is willing to take patient back home but will appreciate if home health can be set up  Hypokalemia -Monitoring we will replete orally today    Other information:   DVT prophylaxis:  Enoxaparin ordered. Code Status: Full Family Communication: Patient's husband was at bedside.. Plan of care was discussed Disposition Plan: Likely to be discharged home with home health 09/04/2020 Consults called: General surgery/vascular Admission status: Inpatient     Admission status:    Status is: Inpatient  Remains inpatient appropriate because:Inpatient level of care appropriate due to severity of illness   Dispo: The patient is from: Home              Anticipated d/c is to: Home likely with home health              Anticipated d/c date is: 1-2 days               Patient currently is not medically stable to d/c.  Will continue with IV antibiotics, in anticipation to switch to p.o. if her cultures remain negative next 24 hours also titrating blood pressure medications The patient and her husband refused SNF, open to home health        Procedures:   - Arteriogram and revascularization 09/01/2020 status post left lower extremity angioplasty and stent placement in left popliteal artery-angioplasty of left anterior tibial artery  Antimicrobials:  Anti-infectives (From admission, onward)   Start     Dose/Rate Route Frequency Ordered Stop   09/01/20 0815  ceFAZolin (ANCEF) IVPB 1 g/50 mL premix       Note to Pharmacy: To be given in specials   1 g 100 mL/hr over 30 Minutes Intravenous  Once 09/01/20 0805 09/01/20 1124   08/31/20 0600  vancomycin (VANCOCIN) IVPB 1000 mg/200 mL premix  Status:  Discontinued        1,000 mg 200 mL/hr over 60 Minutes Intravenous Every 36 hours 08/30/20 1558 08/30/20 1627   08/30/20 1800  vancomycin (VANCOCIN) IVPB 1000 mg/200 mL premix  Status:  Discontinued        1,000 mg 200 mL/hr over 60 Minutes Intravenous Every 24 hours 08/29/20 1725 08/30/20 1558   08/30/20 1800  linezolid (ZYVOX) IVPB 600 mg        600 mg 300 mL/hr over 60 Minutes Intravenous Every 12 hours 08/30/20 1627     08/29/20 1800  piperacillin-tazobactam (ZOSYN) IVPB 3.375 g         3.375 g 12.5 mL/hr over 240 Minutes Intravenous Every 8 hours 08/29/20 1717 09/02/20 0039   08/29/20 1800  vancomycin (VANCOREADY) IVPB 2000 mg/400 mL        2,000 mg 200 mL/hr over 120 Minutes Intravenous  Once 08/29/20 1718 08/29/20 2020       Medication:  . aspirin EC  81 mg Oral Daily  . atorvastatin  80 mg Oral q1800  . cloNIDine  0.2 mg Transdermal Weekly  . clopidogrel  75 mg Oral Daily  . collagenase   Topical Daily  . enoxaparin (LOVENOX) injection  40 mg Subcutaneous Q24H  . hydrALAZINE  50 mg Oral Q8H  . insulin aspart  0-20 Units Subcutaneous TID WC  . lisinopril  2.5 mg Oral Daily  . sodium chloride flush  3 mL Intravenous Q12H  . sodium chloride flush  3 mL Intravenous Q12H    sodium chloride, sodium chloride, bisacodyl, HYDROcodone-acetaminophen, HYDROmorphone (DILAUDID) injection, morphine injection, ondansetron **OR** ondansetron (ZOFRAN) IV, ondansetron (ZOFRAN) IV, polyethylene glycol, sodium chloride flush, sodium chloride flush   Objective:   Vitals:   09/03/20 0413 09/03/20 0902 09/03/20 0904 09/03/20 1150  BP: (!) 152/51 (!) 157/42 (!) 161/40 (!) 178/63  Pulse: 74 87  79  Resp: 20 20  16   Temp: 99.1 F (37.3 C) 98.7 F (37.1 C)  98.5 F (36.9 C)  TempSrc:  Oral  Oral  SpO2: 99% 98%  99%  Weight:      Height:        Intake/Output Summary (Last 24 hours) at 09/03/2020 1235 Last data filed at 09/03/2020 0900 Gross per 24 hour  Intake 240 ml  Output --  Net 240 ml   Filed Weights   08/29/20 1118  Weight: 99.8 kg     Examination:        Physical Exam:   General:  Alert, oriented, cooperative, no distress;   HEENT:  Normocephalic, PERRL, otherwise with in Normal limits   Neuro:  CNII-XII intact. , normal motor and sensation, reflexes intact   Lungs:   Clear to auscultation BL, Respirations unlabored, no wheezes / crackles  Cardio:    S1/S2, RRR, No murmure, No Rubs or Gallops   Abdomen:   Soft, non-tender, bowel sounds active  all four quadrants,  no guarding or peritoneal signs.  Muscular skeletal:   Severe generalized weaknesses, bedbound, able to move extremities in bed, unable to ambulate due to previous strokes  Limited exam - in bed, able to move all 4 extremities,  2+ pulses,  symmetric, +2 pitting edema  Skin:  Dry, warm to touch, negative for any Rashes, No open wounds  Wounds: Please see nursing documentation  Wounds: Left lower extremity open wounds for picture below eschar, erythema-improving                Left Lower Leg wound (08/29/2020):    ------------------------------------------------------------------------------------------------------------------------------------------    LABs:  CBC Latest Ref Rng & Units 09/02/2020 09/01/2020 08/31/2020  WBC 4.0 - 10.5 K/uL 8.6 9.6 8.3  Hemoglobin 12.0 - 15.0 g/dL 9.7(L) 9.7(L) 10.2(L)  Hematocrit 36 - 46 % 29.1(L) 29.6(L) 30.7(L)  Platelets 150 - 400 K/uL 256 282 285   CMP Latest Ref Rng & Units 09/03/2020 09/02/2020 09/01/2020  Glucose 70 - 99 mg/dL 137(H) 137(H) 128(H)  BUN 8 - 23 mg/dL 22 24(H) 25(H)  Creatinine 0.44 - 1.00 mg/dL 1.27(H) 1.49(H) 1.48(H)  Sodium 135 - 145 mmol/L 137 136 137  Potassium 3.5 - 5.1 mmol/L 3.1(L) 3.5 3.7  Chloride 98 - 111 mmol/L 108 105 106  CO2 22 - 32 mmol/L 22 23 28   Calcium 8.9 - 10.3 mg/dL 7.7(L) 7.8(L) 7.7(L)  Total Protein 6.5 - 8.1 g/dL - - -  Total Bilirubin 0.3 - 1.2 mg/dL - - -  Alkaline Phos 38 - 126 U/L - - -  AST 15 - 41 U/L - - -  ALT 0 - 44 U/L - - -       Micro Results Recent Results (from the past 240 hour(s))  SARS Coronavirus 2 by RT PCR (hospital order, performed in [facility] hospital lab) Nasopharyngeal Nasopharyngeal Swab     Status: None   Collection  Time: 08/29/20 11:25 AM   Specimen: Nasopharyngeal Swab  Result Value Ref Range Status   SARS Coronavirus 2 NEGATIVE NEGATIVE Final    Comment: (NOTE) SARS-CoV-2 target nucleic acids are NOT DETECTED.  The SARS-CoV-2 RNA is  generally detectable in upper and lower respiratory specimens during the acute phase of infection. The lowest concentration of SARS-CoV-2 viral copies this assay can detect is 250 copies / mL. A negative result does not preclude SARS-CoV-2 infection and should not be used as the sole basis for treatment or other patient management decisions.  A negative result may occur with improper specimen collection / handling, submission of specimen other than nasopharyngeal swab, presence of viral mutation(s) within the areas targeted by this assay, and inadequate number of viral copies (<250 copies / mL). A negative result must be combined with clinical observations, patient history, and epidemiological information.  Fact Sheet for Patients:   StrictlyIdeas.no  Fact Sheet for Healthcare Providers: BankingDealers.co.za  This test is not yet approved or  cleared by the [place] FDA and has been authorized for detection and/or diagnosis of SARS-CoV-2 by FDA under an Emergency Use Authorization (EUA).  This EUA will remain in effect (meaning this test can be used) for the duration of the COVID-19 declaration under Section 564(b)(1) of the Act, 21 U.S.C. section 360bbb-3(b)(1), unless the authorization is terminated or revoked sooner.  Performed at [facility], [address] [place], [place] [address]   Blood culture (routine x 2)     Status: None   Collection Time: 08/29/20 11:25 AM   Specimen: BLOOD  Result Value Ref Range Status   Specimen Description BLOOD RIGHT ANTECUBITAL  Final   Special Requests   Final    BOTTLES DRAWN AEROBIC AND ANAEROBIC Blood Culture adequate volume   Culture   Final    NO GROWTH 5 DAYS Performed at [facility], [address] [place], [place] [address]    Report Status 09/03/2020 FINAL  Final  Blood culture (routine x 2)     Status: None   Collection Time: 08/29/20 11:25 AM    Specimen: BLOOD  Result Value Ref Range Status   Specimen Description BLOOD BLOOD LEFT FOREARM  Final   Special Requests   Final    BOTTLES DRAWN AEROBIC AND ANAEROBIC Blood Culture adequate volume   Culture   Final    NO GROWTH 5 DAYS Performed at [facility], [address] [place], [place] [address]    Report Status 09/03/2020 FINAL  Final    Radiology Reports PERIPHERAL VASCULAR CATHETERIZATION  Result Date: 09/01/2020 See Op note  DG Tibia/Fibula Left Port  Result Date: 08/29/2020 CLINICAL DATA:  Foot wound infection of the anterior distal tibia-fibula, left heel, and left calf EXAM: LEFT FOOT - 2 VIEW; PORTABLE LEFT TIBIA AND FIBULA - 2 VIEW COMPARISON:  X-ray 10/13/2015 FINDINGS: Bones are markedly demineralized. There is a chronic appearing fracture deformity of the distal tibial metaphysis with varus angulation. 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 No knee joint effusion. No acute fracture is identified. No evidence of cortical destruction or periostitis to suggest osteomyelitis. Soft tissue ulcerations at the posterolateral aspect of the calf and plantar heel. Marked soft tissue swelling of the foot. Severe extensive atherosclerotic calcifications. IMPRESSION: 1. Soft tissue ulcerations of the calf and plantar heel. No radiographic evidence of osteomyelitis. 2. 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 3. Marked soft tissue swelling of the foot. 4. Chronic appearing fracture deformity  of the distal tibial metaphysis with varus angulation. Electronically Signed   By: Davina Poke D.O.   On: 08/29/2020 13:16   DG Foot 2 Views Left  Result Date: 08/29/2020 CLINICAL DATA:  Foot wound infection of the anterior distal tibia-fibula, left heel, and left calf EXAM: LEFT FOOT - 2 VIEW; PORTABLE LEFT TIBIA AND FIBULA - 2 VIEW COMPARISON:  X-ray 10/13/2015 FINDINGS: Bones are markedly demineralized. There is a chronic appearing  fracture deformity of the distal tibial metaphysis with varus angulation. 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 No knee joint effusion. No acute fracture is identified. No evidence of cortical destruction or periostitis to suggest osteomyelitis. Soft tissue ulcerations at the posterolateral aspect of the calf and plantar heel. Marked soft tissue swelling of the foot. Severe extensive atherosclerotic calcifications. IMPRESSION: 1. Soft tissue ulcerations of the calf and plantar heel. No radiographic evidence of osteomyelitis. 2. 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 3. Marked soft tissue swelling of the foot. 4. Chronic appearing fracture deformity of the distal tibial metaphysis with varus angulation. Electronically Signed   By: Davina Poke D.O.   On: 08/29/2020 13:16    SIGNED: Deatra James, MD, FACP, FHM. [facility],  Pager (please use [email or web address] to page/text)  If 7PM-7AM, please contact night-coverage [email or web address] 09/03/2020, 12:35 PM And vascular

## 2020-09-04 LAB — GLUCOSE, CAPILLARY
Glucose-Capillary: 137 mg/dL — ABNORMAL HIGH (ref 70–99)
Glucose-Capillary: 162 mg/dL — ABNORMAL HIGH (ref 70–99)
Glucose-Capillary: 130 mg/dL — ABNORMAL HIGH (ref 70–99)
Glucose-Capillary: 146 mg/dL — ABNORMAL HIGH (ref 70–99)

## 2020-09-04 LAB — BASIC METABOLIC PANEL
Anion gap: 7 (ref 5–15)
BUN: 18 mg/dL (ref 8–23)
CO2: 22 mmol/L (ref 22–32)
Calcium: 7.7 mg/dL — ABNORMAL LOW (ref 8.9–10.3)
Chloride: 110 mmol/L (ref 98–111)
Creatinine, Ser: 1.1 mg/dL — ABNORMAL HIGH (ref 0.44–1.00)
GFR calc Af Amer: 59 mL/min — ABNORMAL LOW (ref >60)
GFR calc non Af Amer: 51 mL/min — ABNORMAL LOW (ref >60)
Glucose, Bld: 140 mg/dL — ABNORMAL HIGH (ref 70–99)
Potassium: 3.5 mmol/L (ref 3.5–5.1)
Sodium: 139 mmol/L (ref 135–145)

## 2020-09-04 MED ORDER — LABETALOL HCL 100 MG PO TABS
100.0000 mg | ORAL_TABLET | Freq: Two times a day (BID) | ORAL | Status: DC
  Administered 2020-09-04 – 2020-09-05 (×3): 100 mg via ORAL
  Filled 2020-09-04 (×4): qty 1

## 2020-09-04 MED ORDER — CLINDAMYCIN HCL 300 MG PO CAPS: 300.0000 mg | ORAL_CAPSULE | Freq: Three times a day (TID) | ORAL | 0 refills | Status: AC

## 2020-09-04 MED ORDER — CALCIUM GLUCONATE-NACL 2-0.675 GM/100ML-% IV SOLN
2.0000 g | Freq: Once | INTRAVENOUS | Status: AC
  Administered 2020-09-04: 2000 mg via INTRAVENOUS
  Filled 2020-09-04: qty 100

## 2020-09-04 MED ORDER — BACID PO TABS: 2.0000 | ORAL_TABLET | Freq: Three times a day (TID) | ORAL | 0 refills | Status: AC

## 2020-09-04 MED ORDER — ASPIRIN 81 MG PO TBEC
81.0000 mg | DELAYED_RELEASE_TABLET | Freq: Every day | ORAL | 11 refills | Status: DC
Start: 2020-09-05 — End: 2020-12-07

## 2020-09-04 MED ORDER — CLINDAMYCIN HCL 150 MG PO CAPS
300.0000 mg | ORAL_CAPSULE | Freq: Three times a day (TID) | ORAL | Status: DC
  Administered 2020-09-04 – 2020-09-05 (×5): 300 mg via ORAL
  Filled 2020-09-04 (×7): qty 2

## 2020-09-04 MED ORDER — CLONIDINE HCL 0.2 MG/24HR TD PTWK
0.2000 mg | MEDICATED_PATCH | TRANSDERMAL | Status: DC
  Administered 2020-09-04: 0.2 mg via TRANSDERMAL
  Filled 2020-09-04: qty 1

## 2020-09-04 MED ORDER — CLOPIDOGREL BISULFATE 75 MG PO TABS: 75.0000 mg | ORAL_TABLET | Freq: Every day | ORAL | 2 refills | Status: AC

## 2020-09-04 MED ORDER — BACID PO TABS
2.0000 | ORAL_TABLET | Freq: Three times a day (TID) | ORAL | Status: DC
  Administered 2020-09-04: 2 via ORAL
  Filled 2020-09-04 (×6): qty 2

## 2020-09-04 MED ORDER — LISINOPRIL 5 MG PO TABS: 5.0000 mg | ORAL_TABLET | Freq: Every day | ORAL | 2 refills | Status: DC

## 2020-09-04 MED ORDER — LABETALOL HCL 100 MG PO TABS: 100.0000 mg | ORAL_TABLET | Freq: Two times a day (BID) | ORAL | 2 refills | Status: DC

## 2020-09-04 MED ORDER — LISINOPRIL 10 MG PO TABS
5.0000 mg | ORAL_TABLET | Freq: Every day | ORAL | Status: DC
  Administered 2020-09-04 – 2020-09-05 (×2): 5 mg via ORAL
  Filled 2020-09-04 (×2): qty 1

## 2020-09-04 MED ORDER — COLLAGENASE 250 UNIT/GM EX OINT: TOPICAL_OINTMENT | Freq: Every day | CUTANEOUS | 0 refills | Status: DC

## 2020-09-04 NOTE — Progress Notes (Signed)
PROGRESS NOTE    Patient: Brooke Hopkins                            PCP: Leonel Ramsay, MD                    DOB: 10-May-1951            DOA: 08/29/2020 [id number]:[id number]             DOS: 09/04/2020, 11:50 AM   LOS: 6 days   Date of Service: The patient was seen and examined on 09/04/2020  Subjective:    Postop day #3 Status post angiogram -angioplasty and stent placement on her left popliteal artery, angioplasty of left anterior tibial artery, and revascularization by vascular team  The patient was seen and examined this morning, stable no acute distress complaining some nausea but no vomiting. Hypotensive this a.m.... Denies any headaches, shortness of breath or chest pain  Husband concerned due to severe debility he is still willing to assist at home, open to home health. The patient and her husband has refused SNF.     Brief Narrative:   Brooke Hopkins is an 69 y.o. female with PMH significant for HTN, DM 2, cerebrovascular disease status post CVA and CKD who states she was in her usual state of relatively poor health, she is bedbound and dependent on Hoyer lift since her stroke, until 2 weeks ago when she developed a quarter shaped ulcer in the back of her left calf... Which has been progressively getting worse, foul-smelling, draining.   As noted above she is bedbound and is unable to move her leg since her stroke.    ED Course:   The patient was to be afebrile.  She was extremely hypertensive at 256/111.  She was noted to have a large foul-smelling ulcer.  Laboratory data were notable for no leukocytosis with a white count of 9.3 and a lactic acid of 1.8, anion gap 13.  Blood sugar was reasonably controlled at 112.  Plain film showed no evidence of osteomyelitis and sed rate was 47.  General surgery was consulted who noted she would need debridement however they were concerned that she may not have enough vascular flow to heal an extensive debridement.  Full  consult is to follow.   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 clindamycin  Erythema surrounding decubitus ulcer/cellulitis -We will continue IV antibiotics of vancomycin and Zosyn >>> has been switched to Zyvox, D/C9 621 Initiating p.o. antibiotics of clindamycin  -Follow cultures >>> no growth to date -Continue wound care -per surgery recommend no further debridement    Elevated troponin -Remained flat, no chest pain or EKG changes -Monitoring on telemetry closely   Pain control -Improved pain -Stable on hydrocodone  Accelerated hypertension  -Modified home medication continue hydralazine, lisinopril dose reduced due to  elevated creatinine, resume clonidine patch, anticipating adding labetalol -As needed hydralazine     DM2 -Diabetic diet, blood sugar stable  -We will check blood sugar q. ACH S while n.p.o. every 6 hours with SSI coverage  Severe debility/bedbound -in the setting of previous stroke -Continue antiplatelet therapy, statins -PT/OT for evaluation -Husband is willing to take patient back home but will appreciate if home health can be set up -Agreeable to take the patient home likely in a.m.   Hypokalemia -Monitoring we will replete orally today    Other information:    DVT prophylaxis: Enoxaparin ordered. Code Status: Full Family Communication: Patient's husband was at bedside.. Plan of care was discussed Disposition Plan: Likely to be discharged home with home health 09/05/2020 Consults called: General surgery/vascular Admission status: Inpatient -continue to discuss antibiotic coverage, wound care management, blood pressure medication adjustment     Admission status:    Status is: Inpatient  Remains inpatient appropriate because:Inpatient level of care appropriate due to severity of illness   Dispo: The patient is from: Home              Anticipated d/c is to: Home likely with home health              Anticipated d/c date is: In a.m. 09/05/20               Patient currently is not medically stable to d/c.  Will continue with IV antibiotics, in anticipation to switch to p.o. if her cultures remain negative next 24 hours also titrating blood pressure medications The patient and her husband refused SNF, open to home health        Procedures:   - Arteriogram and revascularization 09/01/2020 status post left lower extremity angioplasty and stent placement in left popliteal artery-angioplasty of left anterior tibial artery  Antimicrobials:  Anti-infectives (From admission, onward)   Start     Dose/Rate Route Frequency Ordered Stop   09/04/20 0930  clindamycin (CLEOCIN) capsule 300 mg        300 mg Oral Every 8 hours 09/04/20 0809     09/01/20 0815  ceFAZolin (ANCEF) IVPB 1 g/50 mL premix       Note to Pharmacy: To be given in specials   1 g 100 mL/hr over 30 Minutes Intravenous  Once 09/01/20 0805 09/01/20 1124   08/31/20 0600  vancomycin (VANCOCIN) IVPB 1000 mg/200 mL premix  Status:  Discontinued        1,000 mg 200 mL/hr over 60 Minutes Intravenous Every 36 hours 08/30/20 1558 08/30/20 1627   08/30/20 1800  vancomycin (VANCOCIN) IVPB 1000 mg/200 mL premix  Status:  Discontinued        1,000 mg 200 mL/hr over 60 Minutes Intravenous  Every 24 hours 08/29/20 1725 08/30/20 1558   08/30/20 1800  linezolid (ZYVOX) IVPB 600 mg  Status:  Discontinued        600 mg 300 mL/hr over 60 Minutes Intravenous Every 12 hours 08/30/20 1627 09/04/20 0809   08/29/20 1800  piperacillin-tazobactam (ZOSYN) IVPB 3.375 g        3.375 g 12.5 mL/hr over 240 Minutes Intravenous Every 8 hours 08/29/20 1717 09/02/20 0039   08/29/20 1800  vancomycin (VANCOREADY) IVPB 2000 mg/400 mL        2,000 mg 200 mL/hr over 120 Minutes Intravenous  Once 08/29/20 1718 08/29/20 2020       Medication:  . aspirin EC  81 mg Oral Daily  . atorvastatin  80  mg Oral q1800  . clindamycin  300 mg Oral Q8H  . cloNIDine  0.2 mg Transdermal Weekly  . clopidogrel  75 mg Oral Daily  . collagenase   Topical Daily  . enoxaparin (LOVENOX) injection  40 mg Subcutaneous Q24H  . hydrALAZINE  50 mg Oral Q8H  . insulin aspart  0-20 Units Subcutaneous TID WC  . labetalol  100 mg Oral BID  . lactobacillus acidophilus  2 tablet Oral TID  . lisinopril  5 mg Oral Daily  . sodium chloride flush  3 mL Intravenous Q12H    sodium chloride, sodium chloride, bisacodyl, HYDROcodone-acetaminophen, HYDROmorphone (DILAUDID) injection, morphine injection, ondansetron **OR** ondansetron (ZOFRAN) IV, ondansetron (ZOFRAN) IV, polyethylene glycol   Objective:   Vitals:   09/04/20 0522 09/04/20 1008 09/04/20 1012 09/04/20 1016  BP: (!) 186/47 (!) 176/42 (!) 171/44 (!) 172/60  Pulse: 87 89 87   Resp: 18 18    Temp: 99.3 F (37.4 C) 98.3 F (36.8 C)    TempSrc: Oral Oral    SpO2: 95% 98%    Weight:      Height:        Intake/Output Summary (Last 24 hours) at 09/04/2020 1150 Last data filed at 09/04/2020 0525 Gross per 24 hour  Intake 240 ml  Output 900 ml  Net -660 ml   Filed Weights   08/29/20 1118  Weight: 99.8 kg     Examination:         Physical Exam:   General:  Alert, oriented, cooperative, no distress;   HEENT:  Normocephalic, PERRL, otherwise with in Normal  limits   Neuro:  CNII-XII intact. , normal motor and sensation, reflexes intact   Lungs:   Clear to auscultation BL, Respirations unlabored, no wheezes / crackles  Cardio:    S1/S2, RRR, No murmure, No Rubs or Gallops   Abdomen:   Soft, non-tender, bowel sounds active all four quadrants,  no guarding or peritoneal signs.  Muscular skeletal:   Severe generalized weaknesses, severe lower extremity weakness with +2 edema --chronic post stroke unable to ambulate Limited exam - in bed, able to move all 4 extremities, Normal strength,  2+ pulses,  Symmetric, +2 pitting edema  Skin:  Dry, warm to touch, negative for any Rashes, left lower extremity wound open wounds  Wounds: Please see nursing documentation   Wounds: Left lower extremity open wounds for picture below eschar, erythema-improving                Left Lower Leg wound (08/29/2020):    ------------------------------------------------------------------------------------------------------------------------------------------    LABs:  CBC Latest Ref Rng & Units 09/02/2020 09/01/2020 08/31/2020  WBC 4.0 - 10.5 K/uL 8.6 9.6 8.3  Hemoglobin 12.0 - 15.0 g/dL 9.7(L) 9.7(L) 10.2(L)  Hematocrit 36 - 46 % 29.1(L) 29.6(L) 30.7(L)  Platelets 150 - 400 K/uL 256 282 285   CMP Latest Ref Rng & Units 09/04/2020 09/03/2020 09/02/2020  Glucose 70 - 99 mg/dL 140(H) 137(H) 137(H)  BUN 8 - 23 mg/dL 18 22 24(H)  Creatinine 0.44 - 1.00 mg/dL 1.10(H) 1.27(H) 1.49(H)  Sodium 135 - 145 mmol/L 139 137 136  Potassium 3.5 - 5.1 mmol/L 3.5 3.1(L) 3.5  Chloride 98 - 111 mmol/L 110 108 105  CO2 22 - 32 mmol/L 22 22 23   Calcium 8.9 - 10.3 mg/dL 7.7(L) 7.7(L) 7.8(L)  Total Protein 6.5 - 8.1 g/dL - - -  Total Bilirubin 0.3 - 1.2 mg/dL - - -  Alkaline Phos 38 - 126 U/L - - -  AST 15 - 41 U/L - - -  ALT 0 - 44 U/L - - -       Micro Results Recent Results (from the past 240 hour(s))  SARS Coronavirus 2 by RT PCR (hospital order, performed in [facility]  hospital lab) Nasopharyngeal Nasopharyngeal Swab     Status: None   Collection Time: 08/29/20 11:25 AM   Specimen: Nasopharyngeal Swab  Result Value Ref Range Status   SARS Coronavirus 2 NEGATIVE NEGATIVE Final    Comment: (NOTE) SARS-CoV-2 target nucleic acids are NOT DETECTED.  The SARS-CoV-2 RNA is generally detectable in upper and lower respiratory specimens during the acute phase of infection. The lowest concentration of SARS-CoV-2 viral copies this assay can detect is 250 copies / mL. A negative result does not preclude SARS-CoV-2 infection and should not be used as the sole basis for treatment or other patient management decisions.  A negative result may occur with improper specimen collection / handling, submission of specimen other than nasopharyngeal swab, presence of viral mutation(s) within the areas targeted by this assay, and inadequate number of viral copies (<250 copies / mL). A negative result must be combined with clinical observations, patient history, and epidemiological information.  Fact Sheet for Patients:   StrictlyIdeas.no  Fact Sheet for Healthcare Providers: BankingDealers.co.za  This test is not yet approved or  cleared by the [place] FDA and has been authorized for detection and/or diagnosis of SARS-CoV-2 by FDA under an Emergency Use Authorization (EUA).  This EUA will remain in effect (meaning this test can be used) for the duration of the COVID-19 declaration under Section 564(b)(1) of the Act, 21 U.S.C. section 360bbb-3(b)(1), unless the authorization is terminated or revoked sooner.  Performed at [facility], [address] [place], [place] [address]   Blood culture (routine x 2)     Status: None   Collection Time: 08/29/20 11:25 AM   Specimen: BLOOD  Result Value Ref Range Status   Specimen Description BLOOD RIGHT ANTECUBITAL  Final   Special Requests   Final    BOTTLES DRAWN  AEROBIC AND ANAEROBIC Blood Culture adequate volume   Culture   Final    NO GROWTH 5 DAYS Performed at [facility], [address], [place], [place] [address]    Report Status 09/03/2020 FINAL  Final  Blood culture (routine x 2)     Status: None   Collection Time: 08/29/20 11:25 AM   Specimen: BLOOD  Result Value Ref Range Status   Specimen Description BLOOD BLOOD LEFT FOREARM  Final   Special Requests   Final    BOTTLES DRAWN AEROBIC AND ANAEROBIC Blood Culture adequate volume   Culture   Final    NO GROWTH 5 DAYS Performed at [facility], [address], [place], [place] [address]    Report Status 09/03/2020 FINAL  Final    Radiology Reports PERIPHERAL VASCULAR CATHETERIZATION  Result Date: 09/01/2020 See Op note  DG Tibia/Fibula Left Port  Result Date: 08/29/2020 CLINICAL DATA:  Foot wound infection of the anterior distal tibia-fibula, left heel, and left calf EXAM: LEFT FOOT - 2 VIEW; PORTABLE LEFT TIBIA AND FIBULA - 2 VIEW COMPARISON:  X-ray 10/13/2015 FINDINGS: Bones are markedly demineralized. There is a chronic appearing fracture deformity of the distal tibial metaphysis with varus angulation. 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 No knee joint effusion. No acute fracture is identified. No evidence of cortical destruction or periostitis to suggest osteomyelitis. Soft tissue ulcerations  at the posterolateral aspect of the calf and plantar heel. Marked soft tissue swelling of the foot. Severe extensive atherosclerotic calcifications. IMPRESSION: 1. Soft tissue ulcerations of the calf and plantar heel. No radiographic evidence of osteomyelitis. 2. 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 3. Marked soft tissue swelling of the foot. 4. Chronic appearing fracture deformity of the distal tibial metaphysis with varus angulation. Electronically Signed   By: Davina Poke D.O.   On: 08/29/2020 13:16   DG Foot 2 Views  Left  Result Date: 08/29/2020 CLINICAL DATA:  Foot wound infection of the anterior distal tibia-fibula, left heel, and left calf EXAM: LEFT FOOT - 2 VIEW; PORTABLE LEFT TIBIA AND FIBULA - 2 VIEW COMPARISON:  X-ray 10/13/2015 FINDINGS: Bones are markedly demineralized. There is a chronic appearing fracture deformity of the distal tibial metaphysis with varus angulation. 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 No knee joint effusion. No acute fracture is identified. No evidence of cortical destruction or periostitis to suggest osteomyelitis. Soft tissue ulcerations at the posterolateral aspect of the calf and plantar heel. Marked soft tissue swelling of the foot. Severe extensive atherosclerotic calcifications. IMPRESSION: 1. Soft tissue ulcerations of the calf and plantar heel. No radiographic evidence of osteomyelitis. 2. 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 3. Marked soft tissue swelling of the foot. 4. Chronic appearing fracture deformity of the distal tibial metaphysis with varus angulation. Electronically Signed   By: Davina Poke D.O.   On: 08/29/2020 13:16    SIGNED: Deatra James, MD, FACP, FHM. [facility],  Pager (please use [email or web address] to page/text)  If 7PM-7AM, please contact night-coverage [email or web address] 09/04/2020, 11:50 AM And vascular

## 2020-09-04 NOTE — Evaluation (Signed)
Physical Therapy Evaluation Patient Details Name: Brooke Hopkins MRN: [id number] DOB: 1951/05/09 Today's Date: 09/04/2020   History of Present Illness  Patient is a 69 y.o. female with PMH significant for HTN, DM 2, cerebrovascular disease status post CVA and CKD , mostly bed bound at baseline after stroke requiring hoyer lift at home with recently developed left calf ulcer. Now status post angiogram -angioplasty and stent placement on her left popliteal artery, angioplasty of left anterior tibial artery, and revascularization by vascular team.   Clinical Impression  PT evaluation completed. Patient is mostly bed bound at home and reports getting OOB to wheelchair several times per week with use of hoyer lift. Patient is usually able to assist spouse by rolling in bed for hoyer pad placement and for routine care in the home setting. Patient currently needs maximal assistance for rolling to left and right in bed and is likely deconditioned with generalized weakness compared to her baseline following recent surgical procedure. Patient is able to participate in BLE strengthening therapeutic exercises in bed. Recommend PT to maximize function and decrease caregiver burden on spouse at discharge. Patient has appropriate DME at home and wants to be discharged to home environment with caregiver support when medically ready. PT will continue to follow to address functional limitations listed below.     Follow Up Recommendations Supervision for mobility/OOB;Home health PT    Equipment Recommendations  None recommended by PT    Recommendations for Other Services       Precautions / Restrictions Precautions Precautions: Fall Restrictions Weight Bearing Restrictions: No      Mobility  Bed Mobility Overal bed mobility: Needs Assistance Bed Mobility: Rolling Rolling: Max assist         General bed mobility comments: Max A required for rolling to left and right in bed with use of bed rails for  support. verbal cues for technique and sequencing. encourage patient to reposition often on the bed for skin integrity. of note, patient does have a specialty mattress which makes independent mobility more difficult   Transfers                 General transfer comment: not assessed. patient uses a hoyer lift and is a dependent transfer at baseline.   Ambulation/Gait             General Gait Details: patient is non ambulatory at baseline   Stairs            Wheelchair Mobility    Modified Rankin (Stroke Patients Only)       Balance Overall balance assessment: History of Falls                                           Pertinent Vitals/Pain Pain Assessment: No/denies pain    Home Living Family/patient expects to be discharged to:: Private residence Living Arrangements: Spouse/significant other Available Help at Discharge: Available 24 hours/day Type of Home: House Home Access: Level entry     Home Layout: Able to live on main level with bedroom/bathroom Home Equipment: Bedside commode;Electric scooter;Wheelchair - manual;Hospital bed (hoyer lift )      Prior Function Level of Independence: Needs assistance   Gait / Transfers Assistance Needed: unable to ambulate at baseline. patient reports dependent transfers with hoyer lift at home. patient is usually able to assist with rolling in bed for hoyer pad placement.  spouse is primary caregiver. patient is able to propel manual wheelchair herself but spouse will assit when needed   ADL's / Homemaking Assistance Needed: patient needs assistance from spouse and is able to use the wheelchair in shower         Hand Dominance   Dominant Hand: Right    Extremity/Trunk Assessment   Upper Extremity Assessment Upper Extremity Assessment: Generalized weakness (patient reports chronic LUE weakness from previous CVA )    Lower Extremity Assessment Lower Extremity Assessment: RLE  deficits/detail;LLE deficits/detail RLE Deficits / Details: patient is able to activate hip/knee/ankle movement in supine position. patient has generalized weakness throughout  RLE Sensation: WNL LLE Deficits / Details: chronic weakness from previous CVA. patient unable to ambulate at baseline. active dorsiflexion to approximately neutral position. patient is able to activate hip and knee movement in gravity elimitated position  LLE Sensation: WNL       Communication   Communication: No difficulties  Cognition Arousal/Alertness: Awake/alert Behavior During Therapy: WFL for tasks assessed/performed Overall Cognitive Status: Within Functional Limits for tasks assessed                                        General Comments General comments (skin integrity, edema, etc.): left calf wrapped with bandage     Exercises General Exercises - Lower Extremity Ankle Circles/Pumps: AROM;Strengthening;Both;10 reps;Supine Heel Slides: AAROM;Strengthening;Both;10 reps;Supine Hip ABduction/ADduction: AAROM;Strengthening;Both;10 reps;Supine Other Exercises Other Exercises: verbal cues for exercise technique. encouraged AROM of UE for strengthening   Assessment/Plan    PT Assessment Patient needs continued PT services  PT Problem List Decreased strength;Decreased activity tolerance;Decreased balance;Decreased mobility;Obesity       PT Treatment Interventions DME instruction;Functional mobility training;Therapeutic activities;Therapeutic exercise;Balance training;Patient/family education    PT Goals (Current goals can be found in the Care Plan section)  Acute Rehab PT Goals Patient Stated Goal: to help with rolling in bed to decrease burden on spouse  PT Goal Formulation: With patient Time For Goal Achievement: 09/18/20 Potential to Achieve Goals: Good    Frequency Min 2X/week   Barriers to discharge        Co-evaluation               AM-PAC PT "6 Clicks" Mobility   Outcome Measure Help needed turning from your back to your side while in a flat bed without using bedrails?: A Lot Help needed moving from lying on your back to sitting on the side of a flat bed without using bedrails?: Total Help needed moving to and from a bed to a chair (including a wheelchair)?: Total Help needed standing up from a chair using your arms (e.g., wheelchair or bedside chair)?: Total Help needed to walk in hospital room?: Total Help needed climbing 3-5 steps with a railing? : Total 6 Click Score: 7    End of Session   Activity Tolerance: Patient limited by fatigue Patient left: in bed;with call bell/phone within reach Nurse Communication: Mobility status PT Visit Diagnosis: Muscle weakness (generalized) (M62.81);Other abnormalities of gait and mobility (R26.89)    Time: 0786-7544 PT Time Calculation (min) (ACUTE ONLY): 28 min   Charges:   PT Evaluation $PT Eval Moderate Complexity: 1 Mod PT Treatments $Therapeutic Exercise: 8-22 mins        Minna Merritts, PT, MPT   Percell Locus 09/04/2020, 3:44 PM

## 2020-09-04 NOTE — Care Management Important Message (Signed)
Important Message  Patient Details  Name: Brooke Hopkins MRN: [id number] Date of Birth: June 13, 1951   Medicare Important Message Given:  Yes     Dannette Barbara 09/04/2020, 1:08 PM

## 2020-09-04 NOTE — Progress Notes (Signed)
Wound care education to be performed with the patient's husband when available. BP has been checked manually.

## 2020-09-05 DIAGNOSIS — L03116 Cellulitis of left lower limb: Secondary | ICD-10-CM

## 2020-09-05 DIAGNOSIS — E7800 Pure hypercholesterolemia, unspecified: Secondary | ICD-10-CM

## 2020-09-05 DIAGNOSIS — I160 Hypertensive urgency: Secondary | ICD-10-CM

## 2020-09-05 LAB — CREATININE, SERUM
Creatinine, Ser: 1.27 mg/dL — ABNORMAL HIGH (ref 0.44–1.00)
GFR calc Af Amer: 50 mL/min — ABNORMAL LOW (ref >60)
GFR calc non Af Amer: 43 mL/min — ABNORMAL LOW (ref >60)

## 2020-09-05 LAB — GLUCOSE, CAPILLARY
Glucose-Capillary: 122 mg/dL — ABNORMAL HIGH (ref 70–99)
Glucose-Capillary: 157 mg/dL — ABNORMAL HIGH (ref 70–99)

## 2020-09-05 MED ORDER — FLUCONAZOLE 150 MG PO TABS: 150.0000 mg | ORAL_TABLET | Freq: Every day | ORAL | 2 refills | Status: DC

## 2020-09-05 NOTE — Plan of Care (Signed)
The patient has been discharged. IV has been removed. No falls. The patient husband was educated on performing dressing change to wounds on left lower leg and heel. Leg elevated. Problem: Education: Goal: Knowledge of General Education information will improve Description: Including pain rating scale, medication(s)/side effects and non-pharmacologic comfort measures Outcome: Completed/Met   Problem: Health Behavior/Discharge Planning: Goal: Ability to manage health-related needs will improve Outcome: Completed/Met   Problem: Clinical Measurements: Goal: Ability to maintain clinical measurements within normal limits will improve Outcome: Completed/Met Goal: Will remain free from infection Outcome: Completed/Met Goal: Diagnostic test results will improve Outcome: Completed/Met Goal: Respiratory complications will improve Outcome: Completed/Met Goal: Cardiovascular complication will be avoided Outcome: Completed/Met   Problem: Activity: Goal: Risk for activity intolerance will decrease Outcome: Completed/Met   Problem: Nutrition: Goal: Adequate nutrition will be maintained Outcome: Completed/Met   Problem: Coping: Goal: Level of anxiety will decrease Outcome: Completed/Met   Problem: Elimination: Goal: Will not experience complications related to bowel motility Outcome: Completed/Met Goal: Will not experience complications related to urinary retention Outcome: Completed/Met   Problem: Pain Managment: Goal: General experience of comfort will improve Outcome: Completed/Met   Problem: Safety: Goal: Ability to remain free from injury will improve Outcome: Completed/Met   Problem: Skin Integrity: Goal: Risk for impaired skin integrity will decrease Outcome: Completed/Met   Problem: Acute Rehab PT Goals(only PT should resolve) Goal: Pt will Roll Supine to Side Outcome: Completed/Met Goal: Pt Will Go Supine/Side To Sit Outcome: Completed/Met Goal: Patient Will Perform  Sitting Balance Outcome: Completed/Met Goal: Pt/caregiver will Perform Home Exercise Program Outcome: Completed/Met   Problem: Acute Rehab OT Goals (only OT should resolve) Goal: OT Additional ADL Goal #1 Outcome: Completed/Met Goal: OT Additional ADL Goal #2 Outcome: Completed/Met

## 2020-09-05 NOTE — Discharge Summary (Signed)
Physician Discharge Summary [facility]    Patient: Brooke Hopkins                   Admit date: 08/29/2020   DOB: [birth year]             Discharge date:09/05/2020/8:35 AM [id number]                          PCP: Leonel Ramsay, MD  Disposition: HOME w Home health   Recommendations for Outpatient Follow-up:   . 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 Which has been progressively getting worse, foul-smelling, draining.  As noted above she is bedbound and is unable to move her leg since her stroke.    ED Course: The patient was to be afebrile. She was extremely hypertensive at 256/111. She was noted to have a large foul-smelling ulcer. Laboratory data were notable for no leukocytosis with a white count of 9.3 and a lactic acid of 1.8,anion gap 13.Blood sugar was reasonably controlled at 112.  Plain film showed no evidence of osteomyelitis and sed rate was 47.  General surgery was consulted who noted she would need debridement however they were concerned that she may not have enough vascular flow to heal an extensive debridement. Full consult is to follow.      Rapidly progressive in left lower extremity  /peripheral vascular disease -Patient remains post revascularization -General surgery following recommend no further debridement-continue wound care  Postop day #4 09/01/2020 status post left lower extremity angioplasty and stent placement in left popliteal artery-angioplasty of left anterior tibial artery -Vascular initiated aspirin Plavix statins -Blood cultures negative to date -IV antibiotic Zyvox DC'd 09/04/2020 started on p.o. clindamycin  Erythema surrounding decubitus ulcer/cellulitis -We will continue IV antibiotics of vancomycin and Zosyn >>> has been switched to Zyvox, D/C9 621 Initiating p.o. antibiotics of clindamycin  -Follow cultures >>> no growth to date -Continue wound care -per surgery recommend no further debridement    Elevated troponin -Remained flat, no chest pain or EKG changes    Pain control -Improved pain -Stable on hydrocodone  Accelerated hypertension  -Modified home medication continue hydralazine, lisinopril dose reduced due to elevated creatinine, resume clonidine patch, anticipating adding labetalol -As needed hydralazine    DM2 -Diabetic diet, blood sugar stable Resume home regimen  Severe debility/bedbound -in the setting of previous stroke -Continue antiplatelet therapy, statins -PT/OT for evaluation---Home health has been arranged -Husband is willing to take patient back home but will appreciate if home health can be set up -Agreeable to take the patient home    Hypokalemia -was repleted    Other information:    Code Status:Full Family Communication:Patient's husband  was at bedside.. Plan of care was discussed Disposition Plan: Likely to be discharged home with home health 09/05/2020 Consults called:General surgery/vascular Admission status:Inpatient -continue to discuss antibiotic coverage, wound care management, blood pressure medication adjustment    Dispo: The patient is from: Home   Anticipated d/c is to: Home likely with home health  Anticipated d/c date is:  09/05/20           Discharge Instructions:   Discharge Instructions    Activity as tolerated - No restrictions   Complete by: As directed    Activity as tolerated - No restrictions   Complete by: As directed    Call MD for:  difficulty breathing, headache or visual disturbances   Complete by: As directed    Call MD for:  persistant nausea and vomiting   Complete by: As directed    Call MD for:  redness, tenderness, or signs of infection (pain, swelling, redness, odor or green/yellow discharge around incision site)   Complete by: As directed    Call MD for:  severe uncontrolled pain   Complete by: As directed    Call MD for:  temperature >100.4   Complete by: As directed    Diet Carb Modified   Complete by: As directed    Discharge instructions   Complete by: As directed    F/up with Vascular surg. In 1-2 weeks  Continue wound care .Marland Kitchen   Discharge wound care:   Complete by: As directed    Per wound care instructions   Discharge wound care:   Complete by: As directed    Per wound care .....   Increase activity slowly   Complete by: As directed    Increase activity slowly   Complete by: As directed        Medication List    STOP taking these medications   cephALEXin 500 MG capsule Commonly known as: KEFLEX   ibuprofen 200 MG tablet Commonly known as: ADVIL     TAKE these medications   acetaminophen 325 MG tablet Commonly known as: TYLENOL Take 2 tablets (650 mg total) by mouth every 6 (six) hours as needed for mild pain (or Fever >/= 101).   aspirin 81 MG EC tablet Take 1 tablet (81 mg total) by mouth daily. Swallow whole.   atorvastatin 80 MG tablet Commonly known as: LIPITOR Take 1 tablet (80 mg total) by mouth daily at 6 PM.   clindamycin 300 MG capsule Commonly known as: CLEOCIN Take 1 capsule (300 mg total) by mouth 3 (three) times daily for 7  days.   cloNIDine 0.2 mg/24hr patch Commonly known as: CATAPRES - Dosed in mg/24 hr Place 1 patch (0.2 mg total) onto the skin once a week.   clopidogrel 75 MG tablet Commonly known as: PLAVIX Take 1 tablet (75 mg total) by mouth daily.   collagenase ointment Commonly known as: SANTYL Apply topically daily.   HumuLIN 70/30 KwikPen (70-30) 100 UNIT/ML KwikPen Generic drug: insulin isophane & regular human Inject 40 Units into the skin 2 (two) times daily.   hydrALAZINE 25 MG tablet Commonly known as: APRESOLINE Take 1 tablet (25 mg total) by mouth every 8 (eight) hours.   insulin aspart 100 UNIT/ML injection Commonly known as: novoLOG Inject 0-15 Units into the skin 3 (three) times daily with meals.   insulin lispro 100 UNIT/ML injection Commonly known as: HumaLOG Inject 0-0.05 mLs (0-5 Units total) into the skin at bedtime.   labetalol 100 MG tablet Commonly known  as: NORMODYNE Take 1 tablet (100 mg total) by mouth 2 (two) times daily.   lactobacillus acidophilus Tabs tablet Take 2 tablets by mouth 3 (three) times daily for 10 days.   lisinopril 5 MG tablet Commonly known as: ZESTRIL Take 1 tablet (5 mg total) by mouth daily. What changed:   medication strength  how much to take   ondansetron 4 MG tablet Commonly known as: ZOFRAN Take 1 tablet (4 mg total) by mouth every 6 (six) hours as needed for nausea.   oxyCODONE 5 MG immediate release tablet Commonly known as: Roxicodone Take 1 tablet (5 mg total) by mouth every 8 (eight) hours as needed for moderate pain.       Follow-up Information    Schnier, Dolores Lory, MD Follow up.   Specialties: Vascular Surgery, Cardiology, Radiology, Vascular Surgery Why: Seen as consult. Can see Civil engineer, contracting or Arna Medici. Will need ABI and venous reflux with visit.  Contact information: [place] [address] [phone]              No Known Allergies   Procedures /Studies:   PERIPHERAL VASCULAR  CATHETERIZATION  Result Date: 09/01/2020 See Op note  DG Tibia/Fibula Left Port  Result Date: 08/29/2020 CLINICAL DATA:  Foot wound infection of the anterior distal tibia-fibula, left heel, and left calf EXAM: LEFT FOOT - 2 VIEW; PORTABLE LEFT TIBIA AND FIBULA - 2 VIEW COMPARISON:  X-ray 10/13/2015 FINDINGS: Bones are markedly demineralized. There is a chronic appearing fracture deformity of the distal tibial metaphysis with varus angulation. 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 No knee joint effusion. No acute fracture is identified. No evidence of cortical destruction or periostitis to suggest osteomyelitis. Soft tissue ulcerations at the posterolateral aspect of the calf and plantar heel. Marked soft tissue swelling of the foot. Severe extensive atherosclerotic calcifications. IMPRESSION: 1. Soft tissue ulcerations of the calf and plantar heel. No radiographic evidence of osteomyelitis. 2. 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 3. Marked soft tissue swelling of the foot. 4. Chronic appearing fracture deformity of the distal tibial metaphysis with varus angulation. Electronically Signed   By: Davina Poke D.O.   On: 08/29/2020 13:16   DG Foot 2 Views Left  Result Date: 08/29/2020 CLINICAL DATA:  Foot wound infection of the anterior distal tibia-fibula, left heel, and left calf EXAM: LEFT FOOT - 2 VIEW; PORTABLE LEFT TIBIA AND FIBULA - 2 VIEW COMPARISON:  X-ray 10/13/2015 FINDINGS: Bones are markedly demineralized. There is a chronic appearing fracture deformity of the distal tibial metaphysis with varus angulation. 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 No knee joint effusion. No acute fracture is identified. No evidence of cortical destruction or periostitis to suggest osteomyelitis. Soft tissue ulcerations at the posterolateral aspect of the calf and plantar heel. Marked soft tissue swelling of the foot. Severe extensive  atherosclerotic calcifications. IMPRESSION: 1. Soft tissue ulcerations of the calf and plantar heel. No radiographic evidence of osteomyelitis. 2. 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 3. Marked soft tissue swelling of the foot. 4. Chronic appearing fracture deformity of the distal tibial metaphysis with varus angulation. Electronically Signed   By: Davina Poke D.O.   On: 08/29/2020 13:16     Subjective:   Patient was seen and examined 09/05/2020, 8:35 AM Patient stable today. No acute distress.  No issues overnight Stable for discharge.  Discharge Exam:    Vitals:   09/04/20 1645 09/04/20 1650 09/04/20 1946 09/05/20 5009  BP: (!) 142/52 140/60 (!) 144/48 (!) 146/56  Pulse:   80 77  Resp:   18   Temp:   98.6 F (37 C) 97.6 F (36.4 C)  TempSrc:   Oral Oral  SpO2:   99% 99%  Weight:      Height:        General: Pt lying comfortably in bed & appears in no obvious distress. Cardiovascular: S1 & S2 heard, RRR, S1/S2 +. No murmurs, rubs, gallops or clicks. No JVD or pedal edema. Respiratory: Clear to auscultation without wheezing, rhonchi or crackles. No increased work of breathing. Abdominal:  Non-distended, non-tender & soft. No organomegaly or masses appreciated. Normal bowel sounds heard. CNS: Alert and oriented. No focal deficits. Extremities: no edema, no cyanosis    The results of significant diagnostics from this hospitalization (including imaging, microbiology, ancillary and laboratory) are listed below for reference.      Microbiology:   Recent Results (from the past 240 hour(s))  SARS Coronavirus 2 by RT PCR (hospital order, performed in [facility] hospital lab) Nasopharyngeal Nasopharyngeal Swab     Status: None   Collection Time: 08/29/20 11:25 AM   Specimen: Nasopharyngeal Swab  Result Value Ref Range Status   SARS Coronavirus 2 NEGATIVE NEGATIVE Final    Comment: (NOTE) SARS-CoV-2 target nucleic acids are NOT DETECTED.  The  SARS-CoV-2 RNA is generally detectable in upper and lower respiratory specimens during the acute phase of infection. The lowest concentration of SARS-CoV-2 viral copies this assay can detect is 250 copies / mL. A negative result does not preclude SARS-CoV-2 infection and should not be used as the sole basis for treatment or other patient management decisions.  A negative result may occur with improper specimen collection / handling, submission of specimen other than nasopharyngeal swab, presence of viral mutation(s) within the areas targeted by this assay, and inadequate number of viral copies (<250 copies / mL). A negative result must be combined with clinical observations, patient history, and epidemiological information.  Fact Sheet for Patients:   StrictlyIdeas.no  Fact Sheet for Healthcare Providers: BankingDealers.co.za  This test is not yet approved or  cleared by the [place] FDA and has been authorized for detection and/or diagnosis of SARS-CoV-2 by FDA under an Emergency Use Authorization (EUA).  This EUA will remain in effect (meaning this test can be used) for the duration of the COVID-19 declaration under Section 564(b)(1) of the Act, 21 U.S.C. section 360bbb-3(b)(1), unless the authorization is terminated or revoked sooner.  Performed at [facility], [address] [place], [place] [address]   Blood culture (routine x 2)     Status: None   Collection Time: 08/29/20 11:25 AM   Specimen: BLOOD  Result Value Ref Range Status   Specimen Description BLOOD RIGHT ANTECUBITAL  Final   Special Requests   Final    BOTTLES DRAWN AEROBIC AND ANAEROBIC Blood Culture adequate volume   Culture   Final    NO GROWTH 5 DAYS Performed at [facility], [address] [place], [place] [address]    Report Status 09/03/2020 FINAL  Final  Blood culture (routine x 2)     Status: None   Collection Time: 08/29/20  11:25 AM   Specimen: BLOOD  Result Value Ref Range Status   Specimen Description BLOOD BLOOD LEFT FOREARM  Final   Special Requests   Final    BOTTLES DRAWN AEROBIC AND ANAEROBIC Blood Culture adequate volume   Culture   Final  NO GROWTH 5 DAYS Performed at [facility], [address] [place], [place] [address]    Report Status 09/03/2020 FINAL  Final     Labs:   CBC: Recent Labs  Lab 08/29/20 1125 08/30/20 0504 08/31/20 0410 09/01/20 0444 09/02/20 0330  WBC 9.3 9.4 8.3 9.6 8.6  NEUTROABS 6.7  --  5.6 6.7 5.5  HGB 12.4 11.0* 10.2* 9.7* 9.7*  HCT 37.1 34.4* 30.7* 29.6* 29.1*  MCV 82.3 86.4 82.3 82.7 82.0  PLT 385 312 285 282 388   Basic Metabolic Panel: Recent Labs  Lab 08/31/20 0410 08/31/20 0410 09/01/20 0444 09/02/20 0330 09/03/20 0539 09/04/20 0712 09/05/20 0447  NA 139  --  137 136 137 139  --   K 3.5  --  3.7 3.5 3.1* 3.5  --   CL 107  --  106 105 108 110  --   CO2 23  --  28 23 22 22   --   GLUCOSE 115*  --  128* 137* 137* 140*  --   BUN 27*  --  25* 24* 22 18  --   CREATININE 1.50*   < > 1.48* 1.49* 1.27* 1.10* 1.27*  CALCIUM 8.1*  --  7.7* 7.8* 7.7* 7.7*  --   MG  --   --   --   --  1.5*  --   --    < > = values in this interval not displayed.   Liver Function Tests: Recent Labs  Lab 08/29/20 1125  AST 17  ALT 16  ALKPHOS 63  BILITOT 0.8  PROT 7.4  ALBUMIN 3.3*   BNP (last 3 results) No results for input(s): BNP in the last 8760 hours. Cardiac Enzymes: Recent Labs  Lab 08/29/20 1125  CKTOTAL 28*   CBG: Recent Labs  Lab 09/04/20 0745 09/04/20 1227 09/04/20 1628 09/04/20 2136 09/05/20 0752  GLUCAP 137* 162* 130* 146* 122*  Urinalysis    Component Value Date/Time   COLORURINE YELLOW (A) 06/16/2017 1429   APPEARANCEUR CLOUDY (A) 06/16/2017 1429   LABSPEC 1.017 06/16/2017 1429   PHURINE 5.0 06/16/2017 1429   GLUCOSEU NEGATIVE 06/16/2017 1429   HGBUR SMALL (A) 06/16/2017 1429   BILIRUBINUR NEGATIVE 06/16/2017 1429     KETONESUR NEGATIVE 06/16/2017 1429   PROTEINUR 100 (A) 06/16/2017 1429   NITRITE NEGATIVE 06/16/2017 1429   LEUKOCYTESUR LARGE (A) 06/16/2017 1429         Time coordinating discharge: Over 45 minutes  SIGNED: Deatra James, MD, FACP, FHM. [facility],  Please use [email or web address] to Page If 7PM-7AM, please contact night-coverage [email or web address] 09/05/2020, 8:35 AM

## 2020-09-05 NOTE — Evaluation (Signed)
Occupational Therapy Evaluation Patient Details Name: Brooke Hopkins MRN: [id number] DOB: 02/08/1951 Today's Date: 09/05/2020    History of Present Illness Patient is a 69 y.o. female with PMH significant for HTN, DM 2, cerebrovascular disease status post CVA and CKD , mostly bed bound at baseline after stroke requiring hoyer lift at home with recently developed left calf ulcer. Now status post angiogram -angioplasty and stent placement on her left popliteal artery, angioplasty of left anterior tibial artery, and revascularization by vascular team.    Clinical Impression   Pt was seen for OT evaluation this date. Prior to hospital admission, pt was primarily bed bound, using hoyer lift for transfers to/from w/c with spouse assist. Pt requires assist for LB bathing from w/c level in shower, assist to complete dressing (able to don gown overhead but needs assist to pull down over trunk/hips from bed level). Spouse assists with med mgt, meal prep, housekeeping, transportation (ramp for [place]). Currently pt demonstrates impairments as described below (See OT problem list) which functionally limit her ability to perform ADL/self-care tasks at baseline assist level. Pt likely requiring increased assist 2/2 weakness and s/p surgical procedure.  Pt would benefit from skilled OT to address noted impairments and functional limitations (see below for any additional details) in order to maximize safety and independence while minimizing falls risk and caregiver burden. Upon hospital discharge, recommend HHOT to maximize pt safety and return to functional baseline, minimize falls risk, minimize caregiver burden, and risk of readmission or need for higher level of care.      Follow Up Recommendations  Home health OT    Equipment Recommendations  None recommended by OT    Recommendations for Other Services       Precautions / Restrictions Precautions Precautions: Fall Precaution Comments: L calf  wound Restrictions Weight Bearing Restrictions: No      Mobility Bed Mobility Overal bed mobility: Needs Assistance             General bed mobility comments: Max x2 for scooting up in bed, Max A x1 for rolling  Transfers                 General transfer comment: not assessed. patient uses a hoyer lift and is a dependent transfer at baseline.     Balance Overall balance assessment: History of Falls                                         ADL either performed or assessed with clinical judgement   ADL Overall ADL's : Needs assistance/impaired                                       General ADL Comments: Pt requires set up for self feeding and grooming, at least Mod A to complete dressing and LB bathing, Max A for toileting at bed level     Vision Patient Visual Report: No change from baseline       Perception     Praxis      Pertinent Vitals/Pain Pain Assessment: No/denies pain     Hand Dominance Right   Extremity/Trunk Assessment Upper Extremity Assessment Upper Extremity Assessment: Generalized weakness (patient reports chronic LUE weakness from previous CVA)   Lower Extremity Assessment Lower Extremity Assessment: RLE deficits/detail;LLE deficits/detail RLE Deficits /  Details: patient is able to activate hip/knee/ankle movement in supine position. patient has generalized weakness throughout  RLE Sensation: WNL LLE Deficits / Details: chronic weakness from previous CVA. patient unable to ambulate at baseline. active dorsiflexion to approximately neutral position. patient is able to activate hip and knee movement in gravity elimitated position  LLE Sensation: WNL       Communication Communication Communication: No difficulties   Cognition Arousal/Alertness: Awake/alert Behavior During Therapy: WFL for tasks assessed/performed Overall Cognitive Status: Within Functional Limits for tasks assessed                                      General Comments  L calf bandaged and pillow repositioned    Exercises Other Exercises Other Exercises: bed mobility with nurse tech to improve positioning for self feeding to minimize risk of aspiration/reflux, +2 assist to scoot up in bed   Shoulder Instructions      Home Living Family/patient expects to be discharged to:: Private residence Living Arrangements: Spouse/significant other Available Help at Discharge: Available 24 hours/day Type of Home: House Home Access: Level entry     Home Layout: Able to live on main level with bedroom/bathroom     Bathroom Shower/Tub: Astronomer Accessibility: Yes   Home Equipment: Bedside commode;Electric scooter;Wheelchair - manual;Hospital bed;Hand held shower head ([employer] fast streamer )   Additional Comments: [employer] fast streamer, ramp for [employer]      Prior Functioning/Environment Level of Independence: Needs assistance  Gait / Transfers Assistance Needed: unable to ambulate at baseline. patient reports dependent transfers with hoyer lift at home. patient is usually able to assist with rolling in bed for hoyer pad placement. spouse is primary caregiver. patient is able to propel manual wheelchair herself but spouse will assit when needed  ADL's / Homemaking Assistance Needed: patient needs assistance from spouse for LB bathing, to complete dressing, and set up for grooming and self feeding tasks; pt is in w/c for bath in shower   Comments: pt endorses 1 fall in past 12 months 2/2 stand lift and weakness        OT Problem List: Decreased strength;Decreased activity tolerance;Obesity;Impaired balance (sitting and/or standing);Impaired UE functional use      OT Treatment/Interventions: Self-care/ADL training;Therapeutic exercise;Therapeutic activities;DME and/or AE instruction;Patient/family education;Balance training    OT Goals(Current goals can be found in the care plan section) Acute Rehab OT  Goals Patient Stated Goal: to help with rolling in bed to decrease burden on spouse  OT Goal Formulation: With patient/family Time For Goal Achievement: 09/19/20 Potential to Achieve Goals: Good ADL Goals Additional ADL Goal #1: Pt will perform bed mobility with caregiver independent in assisting for bed level ADL tasks. Additional ADL Goal #2: Pt will perform bathing tasks from w/c level with caregiver providing assist for LB bathing.  OT Frequency: Min 1X/week   Barriers to D/C:            Co-evaluation              AM-PAC OT "6 Clicks" Daily Activity     Outcome Measure Help from another person eating meals?: None Help from another person taking care of personal grooming?: None Help from another person toileting, which includes using toliet, bedpan, or urinal?: A Lot Help from another person bathing (including washing, rinsing, drying)?: A Lot Help from another person to put on and taking off regular upper  body clothing?: A Lot Help from another person to put on and taking off regular lower body clothing?: A Lot 6 Click Score: 16   End of Session    Activity Tolerance: Patient tolerated treatment well Patient left: in bed;with call bell/phone within reach;with bed alarm set;with family/visitor present  OT Visit Diagnosis: Other abnormalities of gait and mobility (R26.89);Muscle weakness (generalized) (M62.81);History of falling (Z91.81)                Time: 2694-8546 OT Time Calculation (min): 18 min Charges:  OT General Charges $OT Visit: 1 Visit OT Evaluation $OT Eval Moderate Complexity: 1 Mod  Jeni Salles, MPH, MS, OTR/L ascom [phone] 09/05/20, 9:21 AM

## 2020-09-06 LAB — CALCIUM, IONIZED: Calcium, Ionized, Serum: 4.6 mg/dL (ref 4.5–5.6)

## 2020-09-12 ENCOUNTER — Other Ambulatory Visit: Payer: Self-pay

## 2020-09-12 ENCOUNTER — Inpatient Hospital Stay
Admission: EM | Admit: 2020-09-12 | Discharge: 2020-09-14 | DRG: 064 | Disposition: A | Discharge status: Home Health | Payer: Medicare HMO | Attending: Internal Medicine | Admitting: Internal Medicine

## 2020-09-12 ENCOUNTER — Emergency Department: Payer: Medicare HMO

## 2020-09-12 DIAGNOSIS — G459 Transient cerebral ischemic attack, unspecified: Secondary | ICD-10-CM

## 2020-09-12 DIAGNOSIS — Z7982 Long term (current) use of aspirin: Secondary | ICD-10-CM

## 2020-09-12 DIAGNOSIS — I890 Lymphedema, not elsewhere classified: Secondary | ICD-10-CM

## 2020-09-12 DIAGNOSIS — Z20822 Contact with and (suspected) exposure to covid-19: Secondary | ICD-10-CM | POA: Diagnosis present

## 2020-09-12 DIAGNOSIS — L97509 Non-pressure chronic ulcer of other part of unspecified foot with unspecified severity: Secondary | ICD-10-CM | POA: Diagnosis present

## 2020-09-12 DIAGNOSIS — I771 Stricture of artery: Secondary | ICD-10-CM | POA: Diagnosis present

## 2020-09-12 DIAGNOSIS — D649 Anemia, unspecified: Secondary | ICD-10-CM | POA: Diagnosis present

## 2020-09-12 DIAGNOSIS — E1151 Type 2 diabetes mellitus with diabetic peripheral angiopathy without gangrene: Secondary | ICD-10-CM | POA: Diagnosis present

## 2020-09-12 DIAGNOSIS — Z7902 Long term (current) use of antithrombotics/antiplatelets: Secondary | ICD-10-CM

## 2020-09-12 DIAGNOSIS — I69354 Hemiplegia and hemiparesis following cerebral infarction affecting left non-dominant side: Secondary | ICD-10-CM

## 2020-09-12 DIAGNOSIS — Z6841 Body Mass Index (BMI) 40.0 and over, adult: Secondary | ICD-10-CM

## 2020-09-12 DIAGNOSIS — E11621 Type 2 diabetes mellitus with foot ulcer: Secondary | ICD-10-CM | POA: Diagnosis present

## 2020-09-12 DIAGNOSIS — I87013 Postthrombotic syndrome with ulcer of bilateral lower extremity: Secondary | ICD-10-CM | POA: Diagnosis present

## 2020-09-12 DIAGNOSIS — Z8249 Family history of ischemic heart disease and other diseases of the circulatory system: Secondary | ICD-10-CM

## 2020-09-12 DIAGNOSIS — I639 Cerebral infarction, unspecified: Principal | ICD-10-CM | POA: Diagnosis present

## 2020-09-12 DIAGNOSIS — Z825 Family history of asthma and other chronic lower respiratory diseases: Secondary | ICD-10-CM

## 2020-09-12 DIAGNOSIS — I87003 Postthrombotic syndrome without complications of bilateral lower extremity: Secondary | ICD-10-CM | POA: Diagnosis present

## 2020-09-12 DIAGNOSIS — G9341 Metabolic encephalopathy: Secondary | ICD-10-CM | POA: Diagnosis present

## 2020-09-12 DIAGNOSIS — Z833 Family history of diabetes mellitus: Secondary | ICD-10-CM

## 2020-09-12 DIAGNOSIS — I10 Essential (primary) hypertension: Secondary | ICD-10-CM | POA: Diagnosis present

## 2020-09-12 DIAGNOSIS — R29700 NIHSS score 0: Secondary | ICD-10-CM | POA: Diagnosis present

## 2020-09-12 DIAGNOSIS — I160 Hypertensive urgency: Secondary | ICD-10-CM | POA: Diagnosis present

## 2020-09-12 DIAGNOSIS — L97229 Non-pressure chronic ulcer of left calf with unspecified severity: Secondary | ICD-10-CM | POA: Diagnosis present

## 2020-09-12 DIAGNOSIS — Z823 Family history of stroke: Secondary | ICD-10-CM

## 2020-09-12 DIAGNOSIS — E162 Hypoglycemia, unspecified: Secondary | ICD-10-CM | POA: Diagnosis present

## 2020-09-12 DIAGNOSIS — Z794 Long term (current) use of insulin: Secondary | ICD-10-CM

## 2020-09-12 DIAGNOSIS — L97429 Non-pressure chronic ulcer of left heel and midfoot with unspecified severity: Secondary | ICD-10-CM | POA: Diagnosis present

## 2020-09-12 DIAGNOSIS — Z79899 Other long term (current) drug therapy: Secondary | ICD-10-CM

## 2020-09-12 DIAGNOSIS — E11649 Type 2 diabetes mellitus with hypoglycemia without coma: Secondary | ICD-10-CM | POA: Diagnosis present

## 2020-09-12 DIAGNOSIS — E785 Hyperlipidemia, unspecified: Secondary | ICD-10-CM | POA: Diagnosis present

## 2020-09-12 LAB — APTT: aPTT: 28 seconds (ref 24–36)

## 2020-09-12 LAB — DIFFERENTIAL
Abs Immature Granulocytes: 0.07 10*3/uL (ref 0.00–0.07)
Basophils Absolute: 0.1 10*3/uL (ref 0.0–0.1)
Basophils Relative: 1 %
Eosinophils Absolute: 0.1 10*3/uL (ref 0.0–0.5)
Eosinophils Relative: 1 %
Immature Granulocytes: 1 %
Lymphocytes Relative: 11 %
Lymphs Abs: 1.1 10*3/uL (ref 0.7–4.0)
Monocytes Absolute: 0.5 10*3/uL (ref 0.1–1.0)
Monocytes Relative: 6 %
Neutro Abs: 7.5 10*3/uL (ref 1.7–7.7)
Neutrophils Relative %: 80 %

## 2020-09-12 LAB — COMPREHENSIVE METABOLIC PANEL
ALT: 11 U/L (ref 0–44)
AST: 12 U/L — ABNORMAL LOW (ref 15–41)
Albumin: 2.7 g/dL — ABNORMAL LOW (ref 3.5–5.0)
Alkaline Phosphatase: 59 U/L (ref 38–126)
Anion gap: 10 (ref 5–15)
BUN: 18 mg/dL (ref 8–23)
CO2: 26 mmol/L (ref 22–32)
Calcium: 8.4 mg/dL — ABNORMAL LOW (ref 8.9–10.3)
Chloride: 105 mmol/L (ref 98–111)
Creatinine, Ser: 1.04 mg/dL — ABNORMAL HIGH (ref 0.44–1.00)
GFR calc Af Amer: >60 mL/min (ref >60)
GFR calc non Af Amer: 54 mL/min — ABNORMAL LOW (ref >60)
Glucose, Bld: 85 mg/dL (ref 70–99)
Potassium: 4.1 mmol/L (ref 3.5–5.1)
Sodium: 141 mmol/L (ref 135–145)
Total Bilirubin: 0.4 mg/dL (ref 0.3–1.2)
Total Protein: 6.4 g/dL — ABNORMAL LOW (ref 6.5–8.1)

## 2020-09-12 LAB — CBC
HCT: 33.4 % — ABNORMAL LOW (ref 36.0–46.0)
Hemoglobin: 10.8 g/dL — ABNORMAL LOW (ref 12.0–15.0)
MCH: 27.1 pg (ref 26.0–34.0)
MCHC: 32.3 g/dL (ref 30.0–36.0)
MCV: 83.7 fL (ref 80.0–100.0)
Platelets: 375 10*3/uL (ref 150–400)
RBC: 3.99 MIL/uL (ref 3.87–5.11)
RDW: 14.9 % (ref 11.5–15.5)
WBC: 9.4 10*3/uL (ref 4.0–10.5)
nRBC: 0 % (ref 0.0–0.2)

## 2020-09-12 LAB — GLUCOSE, CAPILLARY
Glucose-Capillary: 58 mg/dL — ABNORMAL LOW (ref 70–99)
Glucose-Capillary: 81 mg/dL (ref 70–99)
Glucose-Capillary: 90 mg/dL (ref 70–99)
Glucose-Capillary: 90 mg/dL (ref 70–99)
Glucose-Capillary: 92 mg/dL (ref 70–99)

## 2020-09-12 LAB — PROTIME-INR
INR: 0.9 (ref 0.8–1.2)
Prothrombin Time: 11.8 seconds (ref 11.4–15.2)

## 2020-09-12 LAB — LACTIC ACID, PLASMA: Lactic Acid, Venous: 0.9 mmol/L (ref 0.5–1.9)

## 2020-09-12 NOTE — ED Triage Notes (Addendum)
PT to ED via EMS from home. Husband called because she was hard to arouse and slow to respond and has slurred speach. CBG for ems 39, gave D10 and came up to 141. CBG currently 81. PT alert and oriented. Was just released on Monday for infection in her leg, had some stents placed. States while in hospital, her BP top number was very high and bottom was very low. PT has been keeping up with medications. HX of stroke with L side deficit

## 2020-09-12 NOTE — ED Notes (Signed)
Sent rainbow with gray on ice to lab.

## 2020-09-12 NOTE — ED Notes (Signed)
Pt CBG recheck. CBG 56 at this time. Delsa Sale, RN made aware. Pt was given a [place] sandwich tray and two orange juices. Will recheck glucose.

## 2020-09-12 NOTE — ED Provider Notes (Signed)
[facility] Emergency Department Provider Note  ____________________________________________   First MD Initiated Contact with Patient 09/12/20 2357     (approximate)  I have reviewed the triage vital signs and the nursing notes.   HISTORY  Chief Complaint Hypoglycemia    HPI Brooke Hopkins is a 69 y.o. female   with history of hypertension diabetes CVA recent angioplasty left lower extremity with chronic wound presents to the emergency department secondary to episode of minimal responsiveness noted by her husband this morning.  Patient states that her husband brought her lunch and that she ate maybe a half of the sandwich "and that is all I remember".  On EMS arrival patient's blood sugar was noted to be 39.  Patient was given D10 with resultant glucose 141.  Patient states that she feels "fine at present without any complaints.  Patient denies any nausea vomiting diarrhea.  Patient denies any complaints.  Glucose thus far in the emergency department 58 90 and 92.  Patient does admit to poor p.o. intake.    Past Medical History:  Diagnosis Date  . Chronic kidney disease   . Diabetes mellitus   . Hypertension   . Stroke [facility])     Patient Active Problem List   Diagnosis Date Noted  . Decubitus ulcer 08/29/2020  . Essential hypertension   . Type 2 diabetes mellitus with foot ulcer ([place])   . UTI (urinary tract infection) 06/16/2017  . Hypertensive urgency 06/16/2017  . Pure hypercholesterolemia 08/17/2016  . Lymphedema 08/17/2016  . Wound infection 08/16/2016  . 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 Pressure ulcer 10/17/2015  . Cellulitis 10/13/2015    Past Surgical History:  Procedure Laterality Date  . ABDOMINAL HYSTERECTOMY    . CHOLECYSTECTOMY    . HAMMER TOE SURGERY    . KNEE ARTHROSCOPY    . LOWER EXTREMITY ANGIOGRAPHY Left 09/01/2020   Procedure: Lower Extremity Angiography;  Surgeon:  Katha Cabal, MD;  Location: [place] CV LAB;  Service: Cardiovascular;  Laterality: Left;  . LUMBAR DISC SURGERY  11/07/11  . LUMBAR WOUND DEBRIDEMENT  11/27/2011   Procedure: LUMBAR WOUND DEBRIDEMENT;  Surgeon: Eustace Moore;  Location: [place] NEURO ORS;  Service: Neurosurgery;  Laterality: N/A;  . TONSILLECTOMY      Prior to Admission medications   Medication Sig Start Date End Date Taking? Authorizing Provider  acetaminophen (TYLENOL) 325 MG tablet Take 2 tablets (650 mg total) by mouth every 6 (six) hours as needed for mild pain (or Fever >/= 101). Patient not taking: Reported on 08/29/2020 06/20/17   Nicholes Mango, MD  aspirin EC 81 MG EC tablet Take 1 tablet (81 mg total) by mouth daily. Swallow whole. 09/05/20   Shahmehdi, Valeria Batman, MD  atorvastatin (LIPITOR) 80 MG tablet Take 1 tablet (80 mg total) by mouth daily at 6 PM. 10/17/15   Gouru, Aruna, MD  cloNIDine (CATAPRES - DOSED IN MG/24 HR) 0.2 mg/24hr patch Place 1 patch (0.2 mg total) onto the skin once a week. 10/17/15   Nicholes Mango, MD  clopidogrel (PLAVIX) 75 MG tablet Take 1 tablet (75 mg total) by mouth daily. 09/05/20 10/05/20  Deatra James, MD  collagenase (SANTYL) ointment Apply topically daily. 09/05/20   Shahmehdi, Valeria Batman, MD  fluconazole (DIFLUCAN) 150 MG tablet Take 1 tablet (150 mg total) by mouth daily. 09/05/20   Shahmehdi, Valeria Batman, MD  hydrALAZINE (APRESOLINE) 25 MG tablet Take 1 tablet (25 mg  total) by mouth every 8 (eight) hours. 06/21/17   Gouru, Illene Silver, MD  insulin aspart (NOVOLOG) 100 UNIT/ML injection Inject 0-15 Units into the skin 3 (three) times daily with meals. Patient not taking: Reported on 08/29/2020 06/20/17   Nicholes Mango, MD  insulin isophane & regular human (HUMULIN 70/30 KWIKPEN) (70-30) 100 UNIT/ML KwikPen Inject 40 Units into the skin 2 (two) times daily. 11/18/17   [provider]  insulin lispro (HUMALOG) 100 UNIT/ML injection Inject 0-0.05 mLs (0-5 Units total) into the skin at  bedtime. Patient not taking: Reported on 08/29/2020 06/20/17   Nicholes Mango, MD  labetalol (NORMODYNE) 100 MG tablet Take 1 tablet (100 mg total) by mouth 2 (two) times daily. 09/04/20 10/04/20  Deatra James, MD  lactobacillus acidophilus (BACID) TABS tablet Take 2 tablets by mouth 3 (three) times daily for 10 days. 09/04/20 09/14/20  Shahmehdi, Valeria Batman, MD  lisinopril (ZESTRIL) 5 MG tablet Take 1 tablet (5 mg total) by mouth daily. 09/05/20 10/05/20  Shahmehdi, Valeria Batman, MD  ondansetron (ZOFRAN) 4 MG tablet Take 1 tablet (4 mg total) by mouth every 6 (six) hours as needed for nausea. Patient not taking: Reported on 08/29/2020 06/20/17   Nicholes Mango, MD  oxyCODONE (ROXICODONE) 5 MG immediate release tablet Take 1 tablet (5 mg total) by mouth every 8 (eight) hours as needed for moderate pain. Patient not taking: Reported on 08/29/2020 06/20/17   Nicholes Mango, MD    Allergies Patient has no known allergies.  Family History  Problem Relation Age of Onset  . CAD Mother   . Hypertension Mother   . COPD Father   . Diabetes Sister   . Hypertension Sister     Social History Social History   Tobacco Use  . Smoking status: Never Smoker  . Smokeless tobacco: Never Used  Vaping Use  . Vaping Use: Never used  Substance Use Topics  . Alcohol use: No  . Drug use: No    Review of Systems Constitutional: No fever/chills Eyes: No visual changes. ENT: No sore throat. Cardiovascular: Denies chest pain. Respiratory: Denies shortness of breath. Gastrointestinal: No abdominal pain.  No nausea, no vomiting.  No diarrhea.  No constipation. Genitourinary: Negative for dysuria. Musculoskeletal: Negative for neck pain.  Negative for back pain. Integumentary: Negative for rash. Neurological: Negative for headaches, focal weakness or numbness. Endocrine:  Positive for hypoglycemia   ____________________________________________   PHYSICAL EXAM:  VITAL SIGNS: ED Triage Vitals  Enc Vitals Group      BP 09/12/20 1637 (!) 189/82     Pulse Rate 09/12/20 1637 72     Resp 09/12/20 1637 20     Temp 09/12/20 1637 97.8 F (36.6 C)     Temp Source 09/12/20 1637 Oral     SpO2 09/12/20 1637 98 %     Weight 09/12/20 1641 127 kg (280 lb)     Height 09/12/20 1641 1.702 m (5\' 7" )     Head Circumference --      Peak Flow --      Pain Score 09/12/20 1641 0     Pain Loc --      Pain Edu? --      Excl. in [place]? --     Constitutional: Alert and oriented.  Eyes: Conjunctivae are normal.  Head: Atraumatic. Mouth/Throat: Patient is wearing a mask. Neck: No stridor.  No meningeal signs.   Cardiovascular: Normal rate, regular rhythm. Good peripheral circulation. Grossly normal heart sounds. Respiratory: Normal respiratory effort.  No retractions. Gastrointestinal: Soft and nontender. No distention.  Musculoskeletal: No lower extremity tenderness nor edema. No gross deformities of extremities. Neurologic:  Normal speech and language. No gross focal neurologic deficits are appreciated.  Skin:  Skin is warm, dry and intact. Psychiatric: Mood and affect are normal. Speech and behavior are normal.  ____________________________________________   LABS (all labs ordered are listed, but only abnormal results are displayed)  Labs Reviewed  CBC - Abnormal; Notable for the following components:      Result Value   Hemoglobin 10.8 (*)    HCT 33.4 (*)    All other components within normal limits  COMPREHENSIVE METABOLIC PANEL - Abnormal; Notable for the following components:   Creatinine, Ser 1.04 (*)    Calcium 8.4 (*)    Total Protein 6.4 (*)    Albumin 2.7 (*)    AST 12 (*)    GFR calc non Af Amer 54 (*)    All other components within normal limits  GLUCOSE, CAPILLARY - Abnormal; Notable for the following components:   Glucose-Capillary 58 (*)    All other components within normal limits  GLUCOSE, CAPILLARY  PROTIME-INR  APTT  DIFFERENTIAL  LACTIC ACID, PLASMA  GLUCOSE, CAPILLARY  GLUCOSE,  CAPILLARY  URINALYSIS, COMPLETE (UACMP) WITH MICROSCOPIC  GLUCOSE, CAPILLARY  CBG MONITORING, ED   ____________________________________________  EKG  ED ECG REPORT I, [place] N Lilyana Lippman, the attending physician, personally viewed and interpreted this ECG.   Date: 09/13/2020  EKG Time: 4:49 PM  Rate: 73  Rhythm: Normal sinus rhythm  Axis: Normal  Interval: Normal  ST&T Change: None  ____________________________________________  RADIOLOGY I, [place] Airon Sahni, personally viewed and evaluated these images (plain radiographs) as part of my medical decision making, as well as reviewing the written report by the radiologist.  ED MD interpretation:   Official radiology report(s): CT HEAD WO CONTRAST  Result Date: 09/12/2020 CLINICAL DATA:  Transient ischemic attack (TIA). EXAM: CT HEAD WITHOUT CONTRAST TECHNIQUE: Contiguous axial images were obtained from the base of the skull through the vertex without intravenous contrast. COMPARISON:  Brain MRI 06/19/2017, head CT 06/16/2017. FINDINGS: Brain: Stable, moderate generalized parenchymal atrophy. Redemonstrated chronic infarct within the left external capsule. Redemonstrated chronic lacunar infarcts within the bilateral thalami. Chronic lacunar infarcts within the pons were better appreciated on the MRI of 06/19/2017. Stable moderate ill-defined hypoattenuation within the cerebral white matter which is nonspecific, but consistent with chronic small vessel ischemic disease. There is no acute intracranial hemorrhage. No demarcated cortical infarct. No extra-axial fluid collection. No evidence of intracranial mass. No midline shift. Vascular: No hyperdense vessel.  Atherosclerotic calcifications. Skull: Normal. Negative for fracture or focal lesion. Sinuses/Orbits: Visualized orbits show no acute finding. Mild ethmoid sinus mucosal thickening. No significant mastoid effusion. IMPRESSION: No CT evidence of acute intracranial abnormality. Known chronic  infarcts within the left external capsule, thalami and pons. Stable background moderate generalized parenchymal atrophy and chronic small vessel ischemic disease. Mild ethmoid sinus mucosal thickening. Electronically Signed   By: Kellie Simmering DO   On: 09/12/2020 17:20    ____________________________________________   PROCEDURES   Procedure(s) performed (including Critical Care):  Procedures   ____________________________________________   INITIAL IMPRESSION / MDM / [place] / ED COURSE  As part of my medical decision making, I reviewed the following data within the [place]   57-year-old female with above-stated history and physical exam secondary to hypoglycemia.  Despite eating and receiving D10 patient with recurrent hypoglycemia and  such plan for admission for further evaluation management.  Patient currently on D10  ____________________________________________  FINAL CLINICAL IMPRESSION(S) / ED DIAGNOSES  Final diagnoses:  Hypoglycemia     MEDICATIONS GIVEN DURING THIS VISIT:  Medications - No data to display   ED Discharge Orders    None      *Please note:  Brooke Hopkins was evaluated in Emergency Department on 09/12/2020 for the symptoms described in the history of present illness. She was evaluated in the context of the global COVID-19 pandemic, which necessitated consideration that the patient might be at risk for infection with the SARS-CoV-2 virus that causes COVID-19. Institutional protocols and algorithms that pertain to the evaluation of patients at risk for COVID-19 are in a state of rapid change based on information released by regulatory bodies including the CDC and federal and state organizations. These policies and algorithms were followed during the patient's care in the ED.  Some ED evaluations and interventions may be delayed as a result of limited staffing during and after the pandemic.*  Note:  This document was  prepared using Dragon voice recognition software and may include unintentional dictation errors.   Gregor Hams, MD 09/13/20 [phone]

## 2020-09-13 ENCOUNTER — Observation Stay: Payer: Medicare HMO

## 2020-09-13 ENCOUNTER — Observation Stay (HOSPITAL_COMMUNITY)
Admit: 2020-09-13 | Discharge: 2020-09-13 | Disposition: A | Discharge status: Home / Self Care | Payer: Medicare HMO | Attending: Internal Medicine | Admitting: Internal Medicine

## 2020-09-13 ENCOUNTER — Inpatient Hospital Stay: Payer: Medicare HMO

## 2020-09-13 DIAGNOSIS — I6389 Other cerebral infarction: Secondary | ICD-10-CM | POA: Diagnosis not present

## 2020-09-13 DIAGNOSIS — I87003 Postthrombotic syndrome without complications of bilateral lower extremity: Secondary | ICD-10-CM | POA: Diagnosis present

## 2020-09-13 DIAGNOSIS — E162 Hypoglycemia, unspecified: Secondary | ICD-10-CM | POA: Diagnosis present

## 2020-09-13 DIAGNOSIS — E785 Hyperlipidemia, unspecified: Secondary | ICD-10-CM | POA: Diagnosis not present

## 2020-09-13 DIAGNOSIS — E11621 Type 2 diabetes mellitus with foot ulcer: Secondary | ICD-10-CM | POA: Diagnosis present

## 2020-09-13 DIAGNOSIS — I890 Lymphedema, not elsewhere classified: Secondary | ICD-10-CM | POA: Diagnosis present

## 2020-09-13 DIAGNOSIS — I69354 Hemiplegia and hemiparesis following cerebral infarction affecting left non-dominant side: Secondary | ICD-10-CM | POA: Diagnosis not present

## 2020-09-13 DIAGNOSIS — Z825 Family history of asthma and other chronic lower respiratory diseases: Secondary | ICD-10-CM | POA: Diagnosis not present

## 2020-09-13 DIAGNOSIS — Z8249 Family history of ischemic heart disease and other diseases of the circulatory system: Secondary | ICD-10-CM | POA: Diagnosis not present

## 2020-09-13 DIAGNOSIS — G9341 Metabolic encephalopathy: Secondary | ICD-10-CM | POA: Diagnosis present

## 2020-09-13 DIAGNOSIS — I639 Cerebral infarction, unspecified: Principal | ICD-10-CM

## 2020-09-13 DIAGNOSIS — I10 Essential (primary) hypertension: Secondary | ICD-10-CM | POA: Diagnosis not present

## 2020-09-13 DIAGNOSIS — Z6841 Body Mass Index (BMI) 40.0 and over, adult: Secondary | ICD-10-CM | POA: Diagnosis not present

## 2020-09-13 DIAGNOSIS — Z794 Long term (current) use of insulin: Secondary | ICD-10-CM | POA: Diagnosis not present

## 2020-09-13 DIAGNOSIS — E11649 Type 2 diabetes mellitus with hypoglycemia without coma: Secondary | ICD-10-CM

## 2020-09-13 DIAGNOSIS — L97429 Non-pressure chronic ulcer of left heel and midfoot with unspecified severity: Secondary | ICD-10-CM | POA: Diagnosis present

## 2020-09-13 DIAGNOSIS — Z7902 Long term (current) use of antithrombotics/antiplatelets: Secondary | ICD-10-CM | POA: Diagnosis not present

## 2020-09-13 DIAGNOSIS — Z833 Family history of diabetes mellitus: Secondary | ICD-10-CM | POA: Diagnosis not present

## 2020-09-13 DIAGNOSIS — L97229 Non-pressure chronic ulcer of left calf with unspecified severity: Secondary | ICD-10-CM | POA: Diagnosis present

## 2020-09-13 DIAGNOSIS — Z8673 Personal history of transient ischemic attack (TIA), and cerebral infarction without residual deficits: Secondary | ICD-10-CM | POA: Diagnosis not present

## 2020-09-13 DIAGNOSIS — I771 Stricture of artery: Secondary | ICD-10-CM | POA: Diagnosis present

## 2020-09-13 DIAGNOSIS — Z823 Family history of stroke: Secondary | ICD-10-CM | POA: Diagnosis not present

## 2020-09-13 DIAGNOSIS — E1151 Type 2 diabetes mellitus with diabetic peripheral angiopathy without gangrene: Secondary | ICD-10-CM | POA: Diagnosis present

## 2020-09-13 DIAGNOSIS — I160 Hypertensive urgency: Secondary | ICD-10-CM

## 2020-09-13 DIAGNOSIS — Z20822 Contact with and (suspected) exposure to covid-19: Secondary | ICD-10-CM | POA: Diagnosis present

## 2020-09-13 DIAGNOSIS — D649 Anemia, unspecified: Secondary | ICD-10-CM | POA: Diagnosis present

## 2020-09-13 DIAGNOSIS — Z79899 Other long term (current) drug therapy: Secondary | ICD-10-CM | POA: Diagnosis not present

## 2020-09-13 DIAGNOSIS — Z7982 Long term (current) use of aspirin: Secondary | ICD-10-CM | POA: Diagnosis not present

## 2020-09-13 LAB — BASIC METABOLIC PANEL
Anion gap: 9 (ref 5–15)
BUN: 18 mg/dL (ref 8–23)
CO2: 27 mmol/L (ref 22–32)
Calcium: 8.3 mg/dL — ABNORMAL LOW (ref 8.9–10.3)
Chloride: 104 mmol/L (ref 98–111)
Creatinine, Ser: 1.03 mg/dL — ABNORMAL HIGH (ref 0.44–1.00)
GFR calc Af Amer: >60 mL/min (ref >60)
GFR calc non Af Amer: 55 mL/min — ABNORMAL LOW (ref >60)
Glucose, Bld: 152 mg/dL — ABNORMAL HIGH (ref 70–99)
Potassium: 4.1 mmol/L (ref 3.5–5.1)
Sodium: 140 mmol/L (ref 135–145)

## 2020-09-13 LAB — URINALYSIS, COMPLETE (UACMP) WITH MICROSCOPIC
Bilirubin Urine: NEGATIVE
Glucose, UA: NEGATIVE mg/dL
Ketones, ur: NEGATIVE mg/dL
Nitrite: POSITIVE — AB
Protein, ur: 100 mg/dL — AB
Specific Gravity, Urine: 1.018 (ref 1.005–1.030)
pH: 5 (ref 5.0–8.0)

## 2020-09-13 LAB — ECHOCARDIOGRAM COMPLETE
Height: 67 in
S' Lateral: 3.11 cm
Weight: 4480 oz

## 2020-09-13 LAB — BRAIN NATRIURETIC PEPTIDE: B Natriuretic Peptide: 233.8 pg/mL — ABNORMAL HIGH (ref 0.0–100.0)

## 2020-09-13 LAB — CBC
HCT: 31.6 % — ABNORMAL LOW (ref 36.0–46.0)
Hemoglobin: 10.1 g/dL — ABNORMAL LOW (ref 12.0–15.0)
MCH: 27.4 pg (ref 26.0–34.0)
MCHC: 32 g/dL (ref 30.0–36.0)
MCV: 85.6 fL (ref 80.0–100.0)
Platelets: 342 10*3/uL (ref 150–400)
RBC: 3.69 MIL/uL — ABNORMAL LOW (ref 3.87–5.11)
RDW: 14.8 % (ref 11.5–15.5)
WBC: 9 10*3/uL (ref 4.0–10.5)
nRBC: 0 % (ref 0.0–0.2)

## 2020-09-13 LAB — GLUCOSE, CAPILLARY
Glucose-Capillary: 80 mg/dL (ref 70–99)
Glucose-Capillary: 131 mg/dL — ABNORMAL HIGH (ref 70–99)
Glucose-Capillary: 154 mg/dL — ABNORMAL HIGH (ref 70–99)
Glucose-Capillary: 206 mg/dL — ABNORMAL HIGH (ref 70–99)
Glucose-Capillary: 142 mg/dL — ABNORMAL HIGH (ref 70–99)
Glucose-Capillary: 184 mg/dL — ABNORMAL HIGH (ref 70–99)

## 2020-09-13 LAB — RESP PANEL BY RT PCR (RSV, FLU A&B, COVID)
Influenza A by PCR: NEGATIVE
Influenza B by PCR: NEGATIVE
Respiratory Syncytial Virus by PCR: NEGATIVE
SARS Coronavirus 2 by RT PCR: NEGATIVE

## 2020-09-13 LAB — PROCALCITONIN: Procalcitonin: <0.1 ng/mL

## 2020-09-13 MED ORDER — ONDANSETRON HCL 4 MG PO TABS: 4.0000 mg | ORAL_TABLET | Freq: Four times a day (QID) | ORAL | Status: DC | PRN

## 2020-09-13 MED ORDER — POTASSIUM CHLORIDE 2 MEQ/ML IV SOLN
INTRAVENOUS | Status: DC
  Filled 2020-09-13 (×2): qty 1000

## 2020-09-13 MED ORDER — INSULIN ASPART 100 UNIT/ML ~~LOC~~ SOLN
0.0000 [IU] | Freq: Three times a day (TID) | SUBCUTANEOUS | Status: DC
  Administered 2020-09-13: 1 [IU] via SUBCUTANEOUS
  Administered 2020-09-13: 3 [IU] via SUBCUTANEOUS
  Filled 2020-09-13: qty 1

## 2020-09-13 MED ORDER — RISAQUAD PO CAPS
1.0000 | ORAL_CAPSULE | Freq: Two times a day (BID) | ORAL | Status: DC
  Administered 2020-09-13 (×2): 1 via ORAL
  Filled 2020-09-13 (×4): qty 1

## 2020-09-13 MED ORDER — DEXTROSE 10 % IV SOLN: INTRAVENOUS | Status: DC

## 2020-09-13 MED ORDER — HYDRALAZINE HCL 50 MG PO TABS
100.0000 mg | ORAL_TABLET | Freq: Three times a day (TID) | ORAL | Status: DC
  Administered 2020-09-13 – 2020-09-14 (×3): 100 mg via ORAL
  Filled 2020-09-13 (×3): qty 2

## 2020-09-13 MED ORDER — ASPIRIN EC 81 MG PO TBEC
81.0000 mg | DELAYED_RELEASE_TABLET | Freq: Every day | ORAL | Status: DC
  Administered 2020-09-13: 81 mg via ORAL
  Filled 2020-09-13: qty 1

## 2020-09-13 MED ORDER — CLOPIDOGREL BISULFATE 75 MG PO TABS
75.0000 mg | ORAL_TABLET | Freq: Every day | ORAL | Status: DC
  Administered 2020-09-13: 75 mg via ORAL
  Filled 2020-09-13: qty 1

## 2020-09-13 MED ORDER — LISINOPRIL 10 MG PO TABS
5.0000 mg | ORAL_TABLET | Freq: Every day | ORAL | Status: DC
  Administered 2020-09-13: 5 mg via ORAL
  Filled 2020-09-13: qty 1

## 2020-09-13 MED ORDER — FUROSEMIDE 10 MG/ML IJ SOLN
40.0000 mg | Freq: Once | INTRAMUSCULAR | Status: AC
  Administered 2020-09-13: 40 mg via INTRAVENOUS
  Filled 2020-09-13: qty 4

## 2020-09-13 MED ORDER — MAGNESIUM HYDROXIDE 400 MG/5ML PO SUSP: 30.0000 mL | Freq: Every day | ORAL | Status: DC | PRN

## 2020-09-13 MED ORDER — ONDANSETRON HCL 4 MG/2ML IJ SOLN: 4.0000 mg | Freq: Four times a day (QID) | INTRAMUSCULAR | Status: DC | PRN

## 2020-09-13 MED ORDER — ENOXAPARIN SODIUM 40 MG/0.4ML ~~LOC~~ SOLN
40.0000 mg | Freq: Two times a day (BID) | SUBCUTANEOUS | Status: DC
  Administered 2020-09-13 (×2): 40 mg via SUBCUTANEOUS
  Filled 2020-09-13 (×2): qty 0.4

## 2020-09-13 MED ORDER — HYDRALAZINE HCL 50 MG PO TABS
75.0000 mg | ORAL_TABLET | Freq: Once | ORAL | Status: AC
  Administered 2020-09-13: 75 mg via ORAL
  Filled 2020-09-13: qty 2

## 2020-09-13 MED ORDER — TRAZODONE HCL 50 MG PO TABS: 25.0000 mg | ORAL_TABLET | Freq: Every evening | ORAL | Status: DC | PRN

## 2020-09-13 MED ORDER — ATORVASTATIN CALCIUM 20 MG PO TABS
80.0000 mg | ORAL_TABLET | Freq: Every day | ORAL | Status: DC
  Administered 2020-09-13: 80 mg via ORAL
  Filled 2020-09-13: qty 4

## 2020-09-13 MED ORDER — COLLAGENASE 250 UNIT/GM EX OINT
TOPICAL_OINTMENT | Freq: Every day | CUTANEOUS | Status: DC
  Filled 2020-09-13: qty 30

## 2020-09-13 MED ORDER — NITROGLYCERIN 2 % TD OINT
1.0000 [in_us] | TOPICAL_OINTMENT | Freq: Four times a day (QID) | TRANSDERMAL | Status: DC
  Administered 2020-09-13 (×2): 1 [in_us] via TOPICAL
  Filled 2020-09-13 (×2): qty 1

## 2020-09-13 MED ORDER — HYDRALAZINE HCL 50 MG PO TABS
25.0000 mg | ORAL_TABLET | Freq: Three times a day (TID) | ORAL | Status: DC
  Administered 2020-09-13: 25 mg via ORAL
  Filled 2020-09-13: qty 1

## 2020-09-13 MED ORDER — LABETALOL HCL 100 MG PO TABS
100.0000 mg | ORAL_TABLET | Freq: Two times a day (BID) | ORAL | Status: DC
  Administered 2020-09-13 (×2): 100 mg via ORAL
  Filled 2020-09-13 (×4): qty 1

## 2020-09-13 MED ORDER — CLONIDINE HCL 0.2 MG/24HR TD PTWK: 0.2000 mg | MEDICATED_PATCH | TRANSDERMAL | Status: DC

## 2020-09-13 MED ORDER — LIVING WELL WITH DIABETES BOOK
Freq: Once | Status: AC
  Filled 2020-09-13: qty 1

## 2020-09-13 MED ORDER — ACETAMINOPHEN 650 MG RE SUPP: 650.0000 mg | Freq: Four times a day (QID) | RECTAL | Status: DC | PRN

## 2020-09-13 MED ORDER — STROKE: EARLY STAGES OF RECOVERY BOOK: Freq: Once | Status: AC

## 2020-09-13 MED ORDER — ACETAMINOPHEN 325 MG PO TABS: 650.0000 mg | ORAL_TABLET | Freq: Four times a day (QID) | ORAL | Status: DC | PRN

## 2020-09-13 NOTE — Evaluation (Signed)
Occupational Therapy Evaluation Patient Details Name: Brooke Hopkins MRN: [id number] DOB: June 02, 1951 Today's Date: 09/13/2020    History of Present Illness 69 year old female with past medical history of CVA on aspirin and Plavix, diabetes, hypertension, morbid obesity and lymphedema who presented to the emergency room on the evening of 9/14 from her assisted living facility after being brought in for confusion and found to have hypoglycemia.  Brought into the hospitalist service.  Hypoglycemia was mild and quickly resolved.  Patient was more alert and oriented this morning.  In talking to her, she states that she does not remember being confused, but she does remember that her husband will talk to her and she was unable to form the words to talk back.  This seems suspicious for TIA underwent stroke evaluation.  MRI of the brain confirmed subcentimeter acute infarct within the right temporal occipital subcortical white matter and additional subcentimeter acute/subacute infarct within the right parietal lobe white matter as well as signs of moderate to advanced chronic small vessel ischemic disease and old infarct seen on the left side.  Neurology consulted who will see patient.  Patient herself with no complaints   Clinical Impression   Patient is bed level at home with use of hoyer lift for functional transfers. Pt and caregiver reporting change in B UE strength and grip strength making is harder for caregiver to provide assistance. Pt having greater difficulty assisting caregiver with rolling in bed and recently went from sit <>stand lift to hoyer lift which impacts has pt can get onto toilet and into shower secondary to lift size. Patient currently functioning close to baseline other than strength concerns at this time.. Patient will benefit from acute OT to decrease caregiver burden. OT will provide pt with B UE strengthening HEP for home use at next session.     Follow Up Recommendations  Home  health OT    Equipment Recommendations  None recommended by OT       Precautions / Restrictions Precautions Precautions: Fall Precaution Comments: L calf wound      Mobility Bed Mobility Overal bed mobility: Needs Assistance Bed Mobility: Rolling Rolling: Max assist         General bed mobility comments: max A for rolling. Assist of 4 people to transfer pt from stretcher to hospital bed  Transfers        General transfer comment: dependent transfer at home    Balance Overall balance assessment: History of Falls          ADL either performed or assessed with clinical judgement   ADL Overall ADL's : Needs assistance/impaired      General ADL Comments: Set up A for self feeding. Pt gets into wheelchair a few times each week. Total A for LB dressing. Pt and caregiver report using a sit <>stander in the bathroom for ADLs but that her UEs have been getting weaker and recently been using hoyer lift     Vision Patient Visual Report: No change from baseline              Pertinent Vitals/Pain Pain Assessment: 0-10 Pain Score: 6  Pain Location: buttocks Pain Descriptors / Indicators: Discomfort Pain Intervention(s): Limited activity within patient's tolerance;Repositioned     Hand Dominance Right   Extremity/Trunk Assessment Upper Extremity Assessment Upper Extremity Assessment: Generalized weakness   Lower Extremity Assessment RLE Deficits / Details: patient is able to activate hip/knee/ankle movement in supine position. patient has generalized weakness throughout  LLE Deficits / Details:  chronic weakness from previous CVA. patient unable to ambulate at baseline. active dorsiflexion to approximately neutral position. patient is able to activate hip and knee movement in gravity elimitated position  LLE Sensation: WNL       Communication Communication Communication: No difficulties   Cognition Arousal/Alertness: Awake/alert Behavior During Therapy: WFL for  tasks assessed/performed Overall Cognitive Status: Within Functional Limits for tasks assessed                     Home Living Family/patient expects to be discharged to:: Private residence Living Arrangements: Spouse/significant other Available Help at Discharge: Available 24 hours/day Type of Home: House Home Access: Level entry     Home Layout: Able to live on main level with bedroom/bathroom     Bathroom Shower/Tub: Astronomer Accessibility: Yes   Home Equipment: Bedside commode;Electric scooter;Wheelchair - manual;Hospital bed;Hand held shower head   Additional Comments: [employer] fast streamer, ramp for [employer]      Prior Functioning/Environment Level of Independence: Needs assistance  Gait / Transfers Assistance Needed: unable to ambulate at baseline. patient reports dependent transfers with hoyer lift at home. patient is usually able to assist with rolling in bed for hoyer pad placement. spouse is primary caregiver. patient is able to propel manual wheelchair herself but spouse will assit when needed  ADL's / Homemaking Assistance Needed: patient needs assistance from spouse for LB bathing, to complete dressing, and set up for grooming and self feeding tasks; pt is in w/c for bath in shower            OT Problem List: Decreased strength;Decreased activity tolerance;Obesity;Impaired balance (sitting and/or standing);Impaired UE functional use      OT Treatment/Interventions: Self-care/ADL training;Therapeutic exercise;Therapeutic activities;DME and/or AE instruction;Patient/family education;Balance training    OT Goals(Current goals can be found in the care plan section) Acute Rehab OT Goals Patient Stated Goal: to help with grip/UE strength to decrease burden on spouse OT Goal Formulation: With patient/family Time For Goal Achievement: 09/19/20 Potential to Achieve Goals: Good ADL Goals Pt/caregiver will Perform Home Exercise Program: Increased  strength;Both right and left upper extremity;With written HEP provided;With Supervision  OT Frequency: Min 1X/week    AM-PAC OT "6 Clicks" Daily Activity     Outcome Measure Help from another person eating meals?: None Help from another person taking care of personal grooming?: None Help from another person toileting, which includes using toliet, bedpan, or urinal?: A Lot Help from another person bathing (including washing, rinsing, drying)?: A Lot Help from another person to put on and taking off regular upper body clothing?: A Lot Help from another person to put on and taking off regular lower body clothing?: A Lot 6 Click Score: 16   End of Session Nurse Communication: Mobility status  Activity Tolerance: Patient tolerated treatment well Patient left: in bed;with call bell/phone within reach;with bed alarm set;with family/visitor present  OT Visit Diagnosis: Other abnormalities of gait and mobility (R26.89);Muscle weakness (generalized) (M62.81);History of falling (Z91.81)                Time: 0630-1601 OT Time Calculation (min): 10 min Charges:  OT General Charges $OT Visit: 1 Visit OT Evaluation $OT Eval Moderate Complexity: 1 [address] [place], OTR/L , CBIS ascom [phone]  09/13/20, 4:13 PM

## 2020-09-13 NOTE — Progress Notes (Signed)
*  PRELIMINARY RESULTS* Echocardiogram 2D Echocardiogram has been performed.  Brooke Hopkins 09/13/2020, 2:21 PM

## 2020-09-13 NOTE — ED Notes (Addendum)
Pt moved to hospital bed. Pt cleaned and changed of urine. Purewick intermittently working, so pt given brief and chux. Pt states she is much more comfortable in that bed. Husband remains at bedside.

## 2020-09-13 NOTE — Progress Notes (Signed)
   Follow Up Note  HPI: Patient is a 69 year old female with past medical history of CVA on aspirin and Plavix, diabetes, hypertension, morbid obesity and lymphedema who presented to the emergency room on the evening of 9/14 from her assisted living facility after being brought in for confusion and found to have hypoglycemia.  Brought into the hospitalist service.  Hypoglycemia was mild and quickly resolved.  Patient was more alert and oriented this morning.  In talking to her, she states that she does not remember being confused, but she does remember that her husband will talk to her and she was unable to form the words to talk back.  This seems suspicious for TIA underwent stroke evaluation.  MRI of the brain confirmed subcentimeter acute infarct within the right temporal occipital subcortical white matter and additional subcentimeter acute/subacute infarct within the right parietal lobe white matter as well as signs of moderate to advanced chronic small vessel ischemic disease and old infarct seen on the left side.  Neurology consulted who will see patient.  Patient herself with no complaints  Exam: CV: Regular rate and rhythm, S1-S2 Lungs: Clear to auscultation bilaterally Abd: Soft, nontender, nondistended, positive bowel sounds Ext: Chronic lower extremity lymphedema  Principal Problem:   Acute CVA (cerebrovascular accident) [facility]): Stroke work-up pending.  Echocardiogram results and carotid Dopplers pending.  Neurology to see.  Already on aspirin and Plavix.  Suspect perhaps small vessel disease.  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  We will try to get it down, around 211 systolic   Type 2 diabetes mellitus with foot ulcer ([place]): CBG stable    Hypoglycemia: Looks to have resolved   Acute metabolic encephalopathy: Resolved, likely in the setting of acute CVA or from hypoglycemia   Morbid obesity ([place]): Meets criteria BMI greater than 40   Disposition: Anticipate return back to assisted living in the next 1 to 2 days

## 2020-09-13 NOTE — ED Notes (Signed)
Pt alert, oriented, resting. Introduced self to pt. Pt denies further needs at this time.

## 2020-09-13 NOTE — Consult Note (Signed)
NEUROLOGY CONSULTATION NOTE   Date of service: September 13, 2020 Patient Name: Brooke Hopkins MRN:  [id number] DOB:  10-Apr-1951 Reason for consult: "Strokes on MRI"  History of Present Illness  Brooke Hopkins is a 69 y.o. female with PMH significant for CKD, prior strokes, DM2, HTN who presents with acute onset AMS and found to have hypoglycemia and hypertensive urgency.  She had MRI Brain without contrast which demonstrated small acute infarct in the R parietal white matter and another punctate infarct in the R temporal occipital subcortical white matter. 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  Patient is non ambulatory at baseline 2/2 ulcers in BL legs. Her husband gave her lunch at 81:30-noon yesterday, an she took her insulin. A while later, husband noted that patient was not responding. Husband immediately called 26. Patient does have faint memory of what was going on and was aware of husband asking her questions. She does not remember if she was able to respond to him. The next thing she remembers is waking up in the ambulance and the EMT told her that blood glucose was down to 39 and blood pressure was high to systolic of 315X.  Her blood pressure runs high at home. Typically runs 458-592 systolics and they check it daily. Patient also reports that her husband lost their insurance coverage about 2 months ago and since then they have had to use 70/31 and its not as intuitive for them to understand how much to use and its more of a guessing game. Blood sugars have been wildly fluctuating since.  Patient endorses 2 prior stroke with residual mild left sided weakness. Mother died of a stroke. No prior diagnosis of Afibb. Does not smoke, does not drink alcohol. 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   ROS   Constitutional Denies weight  loss, fever and chills.  HEENT + changes in vision from cataracts, no problems with hearing.  Respiratory Denies SOB and cough.  CV Denies palpitations and CP  GI Denies abdominal pain, nausea, vomiting and diarrhea.  GU Denies dysuria and urinary frequency.  MSK Denies myalgia and joint pain.  Skin Denies rash and pruritus.  Neurological + headache, no syncope.  Psychiatric Denies recent changes in mood. Denies anxiety but does endorse depression.   Past History   Past Medical History:  Diagnosis Date  . Chronic kidney disease   . Diabetes mellitus   . Hypertension   . Stroke [facility])    Past Surgical History:  Procedure Laterality Date  . ABDOMINAL HYSTERECTOMY    . CHOLECYSTECTOMY    . HAMMER TOE SURGERY    . KNEE ARTHROSCOPY    . LOWER EXTREMITY ANGIOGRAPHY Left 09/01/2020   Procedure: Lower Extremity Angiography;  Surgeon: Katha Cabal, MD;  Location: [facility];  Service: Cardiovascular;  Laterality: Left;  . LUMBAR DISC SURGERY  11/07/11  . LUMBAR WOUND DEBRIDEMENT  11/27/2011   Procedure: LUMBAR WOUND DEBRIDEMENT;  Surgeon: Eustace Moore;  Location: [place] [facility] NEURO ORS;  Service: Neurosurgery;  Laterality: N/A;  . TONSILLECTOMY     Family History  Problem Relation Age of Onset  . CAD Mother   . Hypertension Mother   . COPD Father   . Diabetes Sister   . Hypertension Sister    Social History   Socioeconomic History  . Marital status: Married  Spouse name: Not on file  . Number of children: Not on file  . Years of education: Not on file  . Highest education level: Not on file  Occupational History  . Not on file  Tobacco Use  . Smoking status: Never Smoker  . Smokeless tobacco: Never Used  Vaping Use  . Vaping Use: Never used  Substance and Sexual Activity  . Alcohol use: No  . Drug use: No  . Sexual activity: Yes    Birth control/protection: Post-menopausal  Other Topics Concern  . Not on file  Social History Narrative  . Not on file    Social Determinants of Health   Financial Resource Strain:   . Difficulty of Paying Living Expenses: Not on file  Food Insecurity:   . Worried About Charity fundraiser in the Last Year: Not on file  . Ran Out of Food in the Last Year: Not on file  Transportation Needs:   . Lack of Transportation (Medical): Not on file  . Lack of Transportation (Non-Medical): Not on file  Physical Activity:   . Days of Exercise per Week: Not on file  . Minutes of Exercise per Session: Not on file  Stress:   . Feeling of Stress : Not on file  Social Connections:   . Frequency of Communication with Friends and Family: Not on file  . Frequency of Social Gatherings with Friends and Family: Not on file  . Attends Religious Services: Not on file  . Active Member of Clubs or Organizations: Not on file  . Attends Archivist Meetings: Not on file  . Marital Status: Not on file   No Known Allergies  Medications  (Not in a hospital admission)    Vitals  Temp:  [97.8 F (36.6 C)-98.6 F (37 C)] 98.6 F (37 C) (09/15 0900) Pulse Rate:  [68-80] 78 (09/15 1420) Resp:  [18-25] 18 (09/15 1420) BP: (167-207)/(55-82) 174/55 (09/15 1420) SpO2:  [98 %-99 %] 99 % (09/15 1420) Weight:  [379 kg] 127 kg (09/14 1641)  Body mass index is 43.85 kg/m.  Physical Exam   General: Laying comfortably in bed; in no acute distress.  HENT: Normal oropharynx and mucosa. Normal external appearance of ears and nose. Neck: Supple, no pain or tenderness CV: No JVD. + peripheral edema. Pulmonary: Symmetric Chest rise. Normal respiratory effort. Abdomen: Soft to touch, non-tender Ext: No cyanosis, + edema in BL lower extremities, or deformity  Skin: scaling of skin on BL lower extremities most prominent on shins BL. Normal palpation of skin.   Musculoskeletal: Normal digits and nails by inspection. No clubbing.  Neurologic Examination  Mental status/Cognition: Alert, oriented to self, place, month and year,  good attention. Speech/language: Fluent, comprehension intact, object naming intact, repetition intact. Cranial nerves:   CN II Pupils equal and reactive to light, no VF deficits   CN III,IV,VI EOM intact, no gaze preference or deviation, no nystagmus   CN V normal sensation in V1, V2, and V3 segments bilaterally   CN VII no asymmetry, no nasolabial fold flattening   CN VIII normal hearing to speech   CN IX & X normal palatal elevation, no uvular deviation   CN XI 5/5 head turn and 5/5 shoulder shrug bilaterally   CN XII midline tongue protrusion   Motor:  Muscle bulk: poor, tone milfly increased, pronator drift left upper extremity pronator drift noted. Mvmt Root Nerve  Muscle Right Left Comments  SA C5/6 Ax Deltoid 4+ 4+  EF C5/6 Mc Biceps 5 4+   EE C6/7/8 Rad Triceps 4+ 4   WF C6/7 Med FCR 5 4+   WE C7/8 PIN ECU 5 4+   F Ab C8/T1 U ADM/FDI 5 5   HF L1/2/3 Fem Illopsoas 2 2   KE L2/3/4 Fem Quad 1 1   DF L4/5 D Peron Tib Ant 3 3   PF S1/2 Tibial Grc/Sol 3 3    Reflexes:  Right Left Comments  Pectoralis      Biceps (C5/6) 2 2   Brachioradialis (C5/6) 1 1    Triceps (C6/7) 2 2    Patellar (L3/4) 2 2    Achilles (S1) 0 0    Hoffman      Plantar     Jaw jerk    Sensation:  Light touch Mildly dereased in RUE and RLE and also in BL lower extremities in stocking dirtribution upto below knee.   Pin prick    Temperature    Vibration   Proprioception    Coordination/Complex Motor:  - Finger to Nose intact BL - Heel to shin unable to assess 2/2 weakness. - Rapid alternating movement are slowed in LUE - Gait: Uses wheelchair at baseline.  Labs   Lab Results  Component Value Date   NA 140 09/13/2020   K 4.1 09/13/2020   CL 104 09/13/2020   CO2 27 09/13/2020   GLUCOSE 152 (H) 09/13/2020   BUN 18 09/13/2020   CREATININE 1.03 (H) 09/13/2020   CALCIUM 8.3 (L) 09/13/2020   ALBUMIN 2.7 (L) 09/12/2020   AST 12 (L) 09/12/2020   ALT 11 09/12/2020   ALKPHOS 59 09/12/2020    BILITOT 0.4 09/12/2020   GFRNONAA 55 (L) 09/13/2020   GFRAA >60 09/13/2020     Imaging and Diagnostic studies  MRI Brain without contrast: Mildly motion degraded examination. Subcentimeter acute infarct within the right temporal occipital subcortical white matter. Additional subcentimeter acute or subacute infarct within the right parietal lobe white matter. Moderate/advanced cerebral white matter chronic small vessel ischemic disease, progressed as compared to the MRI of 06/19/2017. Known chronic infarcts within the left corona radiata/external capsule, thalami and pons. Stable, mild to moderate generalized parenchymal atrophy. 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   Impression   Brooke Hopkins is a 69 y.o. female with PMH significant for for CKD, prior strokes, DM2, HTN who presents with acute onset AMS and found to have hypoglycemia and hypertensive urgency. I do not think that the noted tiny strokes on MRI potentially explain the symptoms and are likely an incidental finding. The appearance of the strokes and their location in the white matter space is more consistent with small vessel disease rather than emboli. Findings on the MRI with microhemorrhage suggsets poor long term control of comorbidities including hypertension and diabetes.  Recommendations  - Brain imaging- MRI Brain with 2 subcentimeter white matter strokes in R temporo occipital and R parietal region - Vascular imaging- MRA and Carotid duplex is pending - TTE - pending - Lipid panel - pending - Statin - continue Atorvastatin 80mg  daily - A1C - pending - Antithrombotic - on Aspirin and plavix 2/2 3 stents in her leg and concern for Peripheral artery disease. - SBP goal - Recommend gradual normotension. - Telemetry monitoring for arrythmia - Swallow screen - Stroke  education - PT/OT/SLP consult - I ordered diabetes education referral outpatient.  ______________________________________________________________________  Thank you for the opportunity to take part in the care of this patient. If you have any further questions, please contact the neurology consultation attending.  Signed,  [place] Pager Number [phone]

## 2020-09-13 NOTE — ED Notes (Signed)
Pt covid swabbed and walked to lab.

## 2020-09-13 NOTE — Progress Notes (Signed)
PT Screening Note  Patient Details Name: Brooke Hopkins MRN: [id number] DOB: 12/02/1951   Cancelled Treatment:    Reason Eval/Treat Not Completed: PT screened, no needs identified, will sign off (Chart reviewed, RN consulted. Pt familiar to our services from prior admission.) Author visited with pt and husband at bedside. Pt has been nonambulatory >2 years due to chronic CVA. Family provides care in home, use of hoyer life OOB to [facility] several times each week. Pt/family deny and acute PT needs at this time, but would like to continue with HHPT services at [place]. Will sign off at this time. Recommend use of hoyer lift when mobilizing OOB to chair with nursing.   11:29 AM, 09/13/20 Brooke Hopkins, PT, DPT Physical Therapist - [facility]  [phone] ([place])    Brooke Hopkins 09/13/2020, 11:26 AM

## 2020-09-13 NOTE — Progress Notes (Signed)
PHARMACIST - PHYSICIAN COMMUNICATION  CONCERNING:  Enoxaparin (Lovenox) for DVT Prophylaxis    RECOMMENDATION: Patient was prescribed enoxaprin 40mg  q24 hours for VTE prophylaxis.   Filed Weights   09/12/20 1641  Weight: 127 kg (280 lb)    Body mass index is 43.85 kg/m.  Estimated Creatinine Clearance: 69.8 mL/min (A) (by C-G formula based on SCr of 1.04 mg/dL (H)).   Based on [place] patient is candidate for enoxaparin 40mg  every 12 hours due to BMI being >40.  DESCRIPTION: Pharmacy has adjusted enoxaparin dose per [facility] policy.  Patient is now receiving enoxaparin __40__mg every _12__ hours    Ena Dawley, PharmD Clinical Pharmacist  09/13/2020 3:45 AM

## 2020-09-13 NOTE — H&P (Signed)
[place]   PATIENT NAME: Brooke Hopkins    MR#:  [id number]  DATE OF BIRTH:  1951-07-11  DATE OF ADMISSION:  09/12/2020  PRIMARY CARE PHYSICIAN: Lynnell Jude, MD   REQUESTING/REFERRING PHYSICIAN: Marjean Donna, MD  CHIEF COMPLAINT:   Chief Complaint  Patient presents with  . Hypoglycemia  Decreased responsiveness  HISTORY OF PRESENT ILLNESS:  Brooke Hopkins  is a 69 y.o. obese Caucasian female with a known history of type 2 diabetes mellitus, hypertension, and CVA, presented to the emergency room with acute onset of decreased responsiveness with associated hypoglycemia with a blood glucose of 39 when checked by EMS after being called by her husband.  She states she has been feeling cold but did not check her temperature.  She denied any cough or wheezing or dyspnea.  No dysuria, oliguria or hematuria, urgency or frequency or flank pain.  No headache or dizziness or blurred vision.  She was slightly jittery a year.  She had a recent left leg angioplasty for peripheral vascular disease.  Upon presentation to the emergency room blood pressure was 189/82 and later 198/68 with otherwise normal vital signs labs revealed unremarkable CMP.  CBC showed anemia better than previous levels and lactic acid 0.9.  Blood glucose was 85 and it dropped again.   EKG showed normal sinus rhythm with rate of 73.  The patient was given IV D10W at 100 mill per hour.  She will be admitted to an observation medically monitored bed for further evaluation and management. PAST MEDICAL HISTORY:   Past Medical History:  Diagnosis Date  . Chronic kidney disease   . Diabetes mellitus   . Hypertension   . Stroke [facility])     PAST SURGICAL HISTORY:   Past Surgical History:  Procedure Laterality Date  . ABDOMINAL HYSTERECTOMY    . CHOLECYSTECTOMY    . HAMMER TOE SURGERY    . KNEE ARTHROSCOPY    . LOWER EXTREMITY ANGIOGRAPHY Left 09/01/2020   Procedure: Lower Extremity Angiography;  Surgeon:  Katha Cabal, MD;  Location: [place] [facility];  Service: Cardiovascular;  Laterality: Left;  . LUMBAR DISC SURGERY  11/07/11  . LUMBAR WOUND DEBRIDEMENT  11/27/2011   Procedure: LUMBAR WOUND DEBRIDEMENT;  Surgeon: Eustace Moore;  Location: [place] [facility];  Service: Neurosurgery;  Laterality: N/A;  . TONSILLECTOMY      SOCIAL HISTORY:   Social History   Tobacco Use  . Smoking status: Never Smoker  . Smokeless tobacco: Never Used  Substance Use Topics  . Alcohol use: No    FAMILY HISTORY:   Family History  Problem Relation Age of Onset  . CAD Mother   . Hypertension Mother   . COPD Father   . Diabetes Sister   . Hypertension Sister     DRUG ALLERGIES:  No Known Allergies  REVIEW OF SYSTEMS:   ROS As per history of present illness. All pertinent systems were reviewed above. Constitutional, HEENT, cardiovascular, respiratory, GI, GU, musculoskeletal, neuro, psychiatric, endocrine, integumentary and hematologic systems were reviewed and are otherwise negative/unremarkable except for positive findings mentioned above in the HPI.   MEDICATIONS AT HOME:   Prior to Admission medications   Medication Sig Start Date End Date Taking? Authorizing Provider  aspirin EC 81 MG EC tablet Take 1 tablet (81 mg total) by mouth daily. Swallow whole. 09/05/20  Yes Shahmehdi, Seyed A, MD  atorvastatin (LIPITOR) 80 MG tablet Take 1 tablet (80 mg total) by mouth  daily at 6 PM. 10/17/15  Yes Gouru, Aruna, MD  cloNIDine (CATAPRES - DOSED IN MG/24 HR) 0.2 mg/24hr patch Place 1 patch (0.2 mg total) onto the skin once a week. 10/17/15  Yes Gouru, Illene Silver, MD  clopidogrel (PLAVIX) 75 MG tablet Take 1 tablet (75 mg total) by mouth daily. 09/05/20 10/05/20 Yes Shahmehdi, Valeria Batman, MD  collagenase (SANTYL) ointment Apply topically daily. 09/05/20  Yes Shahmehdi, Seyed A, MD  hydrALAZINE (APRESOLINE) 25 MG tablet Take 1 tablet (25 mg total) by mouth every 8 (eight) hours. 06/21/17  Yes Gouru, Illene Silver, MD    insulin isophane & regular human (HUMULIN 70/30 KWIKPEN) (70-30) 100 UNIT/ML KwikPen Inject 40 Units into the skin 2 (two) times daily. 11/18/17  Yes [provider]  labetalol (NORMODYNE) 100 MG tablet Take 1 tablet (100 mg total) by mouth 2 (two) times daily. 09/04/20 10/04/20 Yes Shahmehdi, Valeria Batman, MD  lactobacillus acidophilus (BACID) TABS tablet Take 2 tablets by mouth 3 (three) times daily for 10 days. 09/04/20 09/14/20 Yes Shahmehdi, Seyed A, MD  lisinopril (ZESTRIL) 5 MG tablet Take 1 tablet (5 mg total) by mouth daily. 09/05/20 10/05/20 Yes Shahmehdi, Valeria Batman, MD  acetaminophen (TYLENOL) 325 MG tablet Take 2 tablets (650 mg total) by mouth every 6 (six) hours as needed for mild pain (or Fever >/= 101). Patient not taking: Reported on 08/29/2020 06/20/17   Nicholes Mango, MD  fluconazole (DIFLUCAN) 150 MG tablet Take 1 tablet (150 mg total) by mouth daily. Patient not taking: Reported on 09/13/2020 09/05/20   Deatra James, MD  insulin aspart (NOVOLOG) 100 UNIT/ML injection Inject 0-15 Units into the skin 3 (three) times daily with meals. Patient not taking: Reported on 08/29/2020 06/20/17   Nicholes Mango, MD  insulin lispro (HUMALOG) 100 UNIT/ML injection Inject 0-0.05 mLs (0-5 Units total) into the skin at bedtime. Patient not taking: Reported on 08/29/2020 06/20/17   Nicholes Mango, MD  ondansetron (ZOFRAN) 4 MG tablet Take 1 tablet (4 mg total) by mouth every 6 (six) hours as needed for nausea. Patient not taking: Reported on 08/29/2020 06/20/17   Nicholes Mango, MD  oxyCODONE (ROXICODONE) 5 MG immediate release tablet Take 1 tablet (5 mg total) by mouth every 8 (eight) hours as needed for moderate pain. Patient not taking: Reported on 08/29/2020 06/20/17   Nicholes Mango, MD      VITAL SIGNS:  Blood pressure (!) 198/68, pulse 68, temperature 97.8 F (36.6 C), temperature source Oral, resp. rate 20, height 5\' 7"  (1.702 m), weight 127 kg, SpO2 98 %.  PHYSICAL EXAMINATION:  Physical  Exam  GENERAL:  69 y.o.-year-old obese Caucasian female patient lying in the bed with no acute distress.  EYES: Pupils equal, round, reactive to light and accommodation. No scleral icterus. Extraocular muscles intact.  HEENT: Head atraumatic, normocephalic. Oropharynx and nasopharynx clear.  NECK:  Supple, no jugular venous distention. No thyroid enlargement, no tenderness.  LUNGS: Normal breath sounds bilaterally, no wheezing, rales,rhonchi or crepitation. No use of accessory muscles of respiration.  CARDIOVASCULAR: Regular rate and rhythm, S1, S2 normal. No murmurs, rubs, or gallops.  ABDOMEN: Soft, nondistended, nontender. Bowel sounds present. No organomegaly or mass.  EXTREMITIES/skin: Her left leg was wrapped around a calf wound without tenderness.  Both lower extremities and 1+ pitting edema to mid calves. NEUROLOGIC: Cranial nerves II through XII are intact. Muscle strength 5/5 in all extremities. Sensation intact. Gait not checked.  PSYCHIATRIC: The patient is alert and oriented x 3.  Normal affect and  good eye contact. SKIN: No obvious other rash, lesion, or ulcer.   LABORATORY PANEL:   CBC Recent Labs  Lab 09/12/20 1653  WBC 9.4  HGB 10.8*  HCT 33.4*  PLT 375   ------------------------------------------------------------------------------------------------------------------  Chemistries  Recent Labs  Lab 09/12/20 1653  NA 141  K 4.1  CL 105  CO2 26  GLUCOSE 85  BUN 18  CREATININE 1.04*  CALCIUM 8.4*  AST 12*  ALT 11  ALKPHOS 59  BILITOT 0.4   ------------------------------------------------------------------------------------------------------------------  Cardiac Enzymes No results for input(s): TROPONINI in the last 168 hours. ------------------------------------------------------------------------------------------------------------------  RADIOLOGY:  CT HEAD WO CONTRAST  Result Date: 09/12/2020 CLINICAL DATA:  Transient ischemic attack (TIA). EXAM:  CT HEAD WITHOUT CONTRAST TECHNIQUE: Contiguous axial images were obtained from the base of the skull through the vertex without intravenous contrast. COMPARISON:  Brain MRI 06/19/2017, head CT 06/16/2017. FINDINGS: Brain: Stable, moderate generalized parenchymal atrophy. Redemonstrated chronic infarct within the left external capsule. Redemonstrated chronic lacunar infarcts within the bilateral thalami. Chronic lacunar infarcts within the pons were better appreciated on the MRI of 06/19/2017. Stable moderate ill-defined hypoattenuation within the cerebral white matter which is nonspecific, but consistent with chronic small vessel ischemic disease. There is no acute intracranial hemorrhage. No demarcated cortical infarct. No extra-axial fluid collection. No evidence of intracranial mass. No midline shift. Vascular: No hyperdense vessel.  Atherosclerotic calcifications. Skull: Normal. Negative for fracture or focal lesion. Sinuses/Orbits: Visualized orbits show no acute finding. Mild ethmoid sinus mucosal thickening. No significant mastoid effusion. IMPRESSION: No CT evidence of acute intracranial abnormality. Known chronic infarcts within the left external capsule, thalami and pons. Stable background moderate generalized parenchymal atrophy and chronic small vessel ischemic disease. Mild ethmoid sinus mucosal thickening. Electronically Signed   By: Kellie Simmering DO   On: 09/12/2020 17:20      IMPRESSION AND PLAN:   1.  Unresponsiveness secondary to hypoglycemia with type 2 diabetes mellitus -The patient will be admitted to the medical monitored bed. -We will hold off Humulin 70/30 and will place the patient on supplemental coverage with NovoLog. -We will check her hemoglobin A1c.  2.  Hypertensive urgency. -We will continue patient hydralazine, labetalol, lisinopril, Catapres patch and place her as needed IV labetalol.  3.  Dyslipidemia -Statin therapy will be resumed.  4.  History of CVA. -We will  continue aspirin Plavix as well as statin therapy.  5.  DVT prophylaxis. -Subcutaneous Lovenox.   All the records are reviewed and case discussed with ED provider. The plan of care was discussed in details with the patient (and family). I answered all questions. The patient agreed to proceed with the above mentioned plan. Further management will depend upon hospital course.   CODE STATUS: Full code  Status is: Observation  The patient remains OBS appropriate and will d/c before 2 midnights.  Dispo: The patient is from: Home              Anticipated d/c is to: Home              Anticipated d/c date is: 1 day              Patient currently is not medically stable to d/c.   TOTAL TIME TAKING CARE OF THIS PATIENT: 55 minutes.    Christel Mormon M.D on 09/13/2020 at 5:14 AM  [facility]   From 7 PM-7 AM, contact night-coverage [email or web address]  CC: Primary care physician; Lynnell Jude, MD   Note: This  dictation was prepared with Dragon dictation along with smaller phrase technology. Any transcriptional typo errors that result from this process are unintentional.

## 2020-09-13 NOTE — ED Notes (Addendum)
Husband taking pt belongings home to be washed (pillows, clothes, etc).

## 2020-09-14 DIAGNOSIS — G9341 Metabolic encephalopathy: Secondary | ICD-10-CM

## 2020-09-14 DIAGNOSIS — I890 Lymphedema, not elsewhere classified: Secondary | ICD-10-CM

## 2020-09-14 DIAGNOSIS — E162 Hypoglycemia, unspecified: Secondary | ICD-10-CM

## 2020-09-14 DIAGNOSIS — I10 Essential (primary) hypertension: Secondary | ICD-10-CM

## 2020-09-14 LAB — LIPID PANEL
Cholesterol: 112 mg/dL (ref 0–200)
HDL: 31 mg/dL — ABNORMAL LOW (ref >40)
LDL Cholesterol: 62 mg/dL (ref 0–99)
Total CHOL/HDL Ratio: 3.6 RATIO
Triglycerides: 93 mg/dL (ref <150)
VLDL: 19 mg/dL (ref 0–40)

## 2020-09-14 LAB — GLUCOSE, CAPILLARY: Glucose-Capillary: 122 mg/dL — ABNORMAL HIGH (ref 70–99)

## 2020-09-14 LAB — HEMOGLOBIN A1C
Hgb A1c MFr Bld: 5.3 % (ref 4.8–5.6)
Mean Plasma Glucose: 105.41 mg/dL

## 2020-09-14 MED ORDER — FUROSEMIDE 10 MG/ML IJ SOLN: 20.0000 mg | Freq: Once | INTRAMUSCULAR | Status: DC

## 2020-09-14 MED ORDER — NOVOLIN 70/30 (70-30) 100 UNIT/ML ~~LOC~~ SUSP: 10.0000 [IU] | Freq: Two times a day (BID) | SUBCUTANEOUS | 1 refills | Status: DC

## 2020-09-14 MED ORDER — FUROSEMIDE 20 MG PO TABS: 20.0000 mg | ORAL_TABLET | Freq: Every day | ORAL | 11 refills | Status: DC

## 2020-09-14 MED ORDER — HYDRALAZINE HCL 50 MG PO TABS: 50.0000 mg | ORAL_TABLET | Freq: Three times a day (TID) | ORAL | Status: DC

## 2020-09-14 NOTE — Discharge Summary (Signed)
Discharge Summary  Brooke Hopkins CWC:[id number] DOB: March 09, 1951  PCP: Lynnell Jude, MD  Admit date: 09/12/2020 Discharge date: 09/14/2020  Time spent: 25 minutes  Recommendations for Outpatient Follow-up:  1. New medication: Lasix 20 mg p.o. daily 2. Medication change: Insulin 70/30 decreased to 10 units twice a day (previously was 40 units twice a day) 3. Patient will follow up with her PCP in approximately a month.  At that time, it is recommended that a follow-up A1c is checked and insulin adjustments can be made  Discharge Diagnoses:  Active Hospital Problems   Diagnosis Date Noted  . Acute CVA (cerebrovascular accident) ([place]) 09/13/2020  . Hypoglycemia 09/13/2020  . Acute metabolic encephalopathy 76/16/0737  . Morbid obesity ([place]) 09/13/2020  . Type 2 diabetes mellitus with foot ulcer ([place])   . Essential hypertension   . Lymphedema 08/17/2016  . 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    Discharge Condition: Improved, being discharged home  Diet recommendation: Carb modified  Vitals:   09/14/20 0638 09/14/20 0735  BP: (!) 137/48 (!) 154/47  Pulse: 77 73  Resp: 18 15  Temp: 98.6 F (37 C) 98.6 F (37 C)  SpO2: 97% 98%    History of present illness:  Patient is a 69 year old female with past medical history of CVA on aspirin and Plavix, diabetes, hypertension, morbid obesity and lymphedema who presented to the emergency room on the evening of 9/14 from her assisted living facility after being brought in for confusion and found to have hypoglycemia.    Noted to have markedly elevated blood pressure.  Brought into the hospitalist service.    Hospital Course:  Principal Problem:   Acute CVA (cerebrovascular accident) [facility]): In talking the patient, she does not remember being confused, but did remember that her husband will talk to her and she was unable to form  the words to talk back.  This had since resolved.  This seems suspicious for TIA so she underwent stroke evaluation.  MRI of the brain confirmed subcentimeter acute infarcts within the right temporal occipital subcortical white matter and additional subcentimeter acute/subacute infarct within the right parietal lobe white matter as well as noted previous old CVAs.  Neurology consulted.  Stroke work-up initiated.  No signs of significant stenosis on Dopplers.  Echocardiogram unrevealing.  A1c actually low, see below.  Lipid panel noted LDL of 62.  Stroke stem cells felt to be very tiny and not the cause of patient's confusion.  See below.  In regards to better stroke prevention, patient already on aspirin and Plavix.  She likely would benefit from better blood pressure control which she says her blood pressures are high.  See below. 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  This is likely from underlying acute CVA, but also component of elevated blood pressure in general.  Her echocardiogram did not comment on diastolic dysfunction but I suspect likely she had some mild underlying diastolic disease.  BNP slightly elevated at 277.  Patient's blood pressure improved after receiving a dose of Lasix.  Have added Lasix to her blood pressure regimen which should help.    Type 2 diabetes mellitus with foot ulcer (HCC) with hyperglycemia causing acute metabolic encephalopathy: Hypoglycemia following admission quickly resolved and patient's mentation normalized after few hours.  Patient states that she has been on 40 units of insulin 70/30 for some time now.  She says that she gets low blood sugars occasionally.  Her A1c at 5.3, which I suspect she has quite a lot of hypoglycemic episodes.  Her 70/30 insulin was held during her hospitalization and while she was allowed a carb  modified diet, CBGs were stable, staying under 200 with only 1 blood sugar 205.  Moving forward on a less restrictive diet at home, have recommended the patient decrease her 70/30 insulin from 40 units twice a day down to 10 units twice a day.  She can follow-up with her PCP and make adjustments accordingly.  This should hopefully prevent any future hypoglycemic episodes.    Morbid obesity [facility]): Patient is criteria BMI greater than 40   Procedures:  Echocardiogram done 9/15: Preserved ejection fraction, no evidence of diastolic dysfunction  Bilateral carotid Dopplers: No signs of significant stenosis  Consultations:  Neurology  Discharge Exam: BP (!) 154/47 (BP Location: Right Arm)   Pulse 73   Temp 98.6 F (37 C) (Oral)   Resp 15   Ht 5\' 7"  (1.702 m)   Wt 127 kg   SpO2 98%   BMI 43.85 kg/m   General: Alert and oriented x3, no acute distress Cardiovascular: Regular rate and rhythm, S1-S2 Respiratory: Clear to auscultation bilaterally  Discharge Instructions You were cared for by a hospitalist during your hospital stay. If you have any questions about your discharge medications or the care you received while you were in the hospital after you are discharged, you can call the unit and asked to speak with the hospitalist on call if the hospitalist that took care of you is not available. Once you are discharged, your primary care physician will handle any further medical issues. Please note that NO REFILLS for any discharge medications will be authorized once you are discharged, as it is imperative that you return to your primary care physician (or establish a relationship with a primary care physician if you do not have one) for your aftercare needs so that they can reassess your need for medications and monitor your lab values.  Discharge Instructions    Ambulatory referral to Nutrition and Diabetic Education   Complete by: As directed    Diet - low sodium heart healthy   Complete  by: As directed    Increase activity slowly   Complete by: As directed      Allergies as of 09/14/2020   No Known Allergies     Medication List    STOP taking these medications   HumuLIN 70/30 KwikPen (70-30) 100 UNIT/ML KwikPen Generic drug: insulin isophane & regular human Replaced by: NovoLIN 70/30 (70-30) 100 UNIT/ML injection     TAKE these medications   aspirin 81 MG EC tablet Take 1 tablet (81 mg total) by mouth daily. Swallow whole.   atorvastatin 80 MG tablet Commonly known as: LIPITOR Take 1 tablet (80 mg total) by mouth daily at 6 PM.   cloNIDine 0.2 mg/24hr patch Commonly known as: CATAPRES - Dosed in mg/24 hr Place 1 patch (0.2 mg total) onto the skin once a week.   clopidogrel 75 MG tablet Commonly known as: PLAVIX Take 1 tablet (75 mg total) by mouth daily.   collagenase ointment Commonly known as: SANTYL Apply topically daily.   furosemide 20 MG tablet Commonly known as: Lasix Take 1 tablet (20 mg total) by mouth daily.   hydrALAZINE 25 MG tablet Commonly known  as: APRESOLINE Take 1 tablet (25 mg total) by mouth every 8 (eight) hours.   labetalol 100 MG tablet Commonly known as: NORMODYNE Take 1 tablet (100 mg total) by mouth 2 (two) times daily.   lactobacillus acidophilus Tabs tablet Take 2 tablets by mouth 3 (three) times daily for 10 days.   lisinopril 5 MG tablet Commonly known as: ZESTRIL Take 1 tablet (5 mg total) by mouth daily.   NovoLIN 70/30 (70-30) 100 UNIT/ML injection Generic drug: insulin NPH-regular Human Inject 10 Units into the skin 2 (two) times daily with a meal. Replaces: HumuLIN 70/30 KwikPen (70-30) 100 UNIT/ML KwikPen      No Known Allergies    The results of significant diagnostics from this hospitalization (including imaging, microbiology, ancillary and laboratory) are listed below for reference.    Significant Diagnostic Studies: CT HEAD WO CONTRAST  Result Date: 09/12/2020 CLINICAL DATA:  Transient  ischemic attack (TIA). EXAM: CT HEAD WITHOUT CONTRAST TECHNIQUE: Contiguous axial images were obtained from the base of the skull through the vertex without intravenous contrast. COMPARISON:  Brain MRI 06/19/2017, head CT 06/16/2017. FINDINGS: Brain: Stable, moderate generalized parenchymal atrophy. Redemonstrated chronic infarct within the left external capsule. Redemonstrated chronic lacunar infarcts within the bilateral thalami. Chronic lacunar infarcts within the pons were better appreciated on the MRI of 06/19/2017. Stable moderate ill-defined hypoattenuation within the cerebral white matter which is nonspecific, but consistent with chronic small vessel ischemic disease. There is no acute intracranial hemorrhage. No demarcated cortical infarct. No extra-axial fluid collection. No evidence of intracranial mass. No midline shift. Vascular: No hyperdense vessel.  Atherosclerotic calcifications. Skull: Normal. Negative for fracture or focal lesion. Sinuses/Orbits: Visualized orbits show no acute finding. Mild ethmoid sinus mucosal thickening. No significant mastoid effusion. IMPRESSION: No CT evidence of acute intracranial abnormality. Known chronic infarcts within the left external capsule, thalami and pons. Stable background moderate generalized parenchymal atrophy and chronic small vessel ischemic disease. Mild ethmoid sinus mucosal thickening. Electronically Signed   By: Kellie Simmering DO   On: 09/12/2020 17:20   MR ANGIO HEAD WO CONTRAST  Result Date: 09/13/2020 CLINICAL DATA:  69 year old female with TIA symptoms and small acute right hemisphere infarcts on brain MRI earlier today. EXAM: MRA HEAD WITHOUT CONTRAST TECHNIQUE: Angiographic images of the Circle of Willis were obtained using MRA technique without intravenous contrast. COMPARISON:  Brain MRI 1216 hours today. FINDINGS: No intracranial mass effect or ventriculomegaly. Antegrade flow in the posterior circulation with dominant left vertebral artery.  Patent PICA origins and vertebrobasilar junction. No distal vertebral stenosis. Patent basilar artery without stenosis. Patent SCA and PCA origins. Tortuous right P1 segment with mild to moderate stenosis (series 1039, image 11). Small posterior communicating arteries suspected. There is also evidence of a moderate right P3 segment stenosis (image 19). Right PCA branches are otherwise within normal limits. Contralateral mild to moderate left P2 and P3 stenoses (image 4). Antegrade flow in both ICA siphons. Siphon irregularity in keeping with atherosclerosis but only mild bilateral distal siphon stenosis. Patent carotid termini. Ophthalmic and posterior communicating artery origins appear normal. Severe stenosis right ACA origin. Mild stenosis left MCA origin. Anterior communicating artery and visible ACA branches are within normal limits. Mild left M1 segment stenosis. Left MCA bifurcation is patent. Right MCA M1 segment and bifurcation are patent without stenosis. Some MCA branch detail is degraded by motion. Mild bilateral MCA branch stenosis suspected. IMPRESSION: 1. Negative for large vessel occlusion. 2. Evidence of multifocal intracranial atherosclerosis with subsequent: - severe stenosis  right ACA origin. - moderate stenosis bilateral PCAs. - mild stenosis left MCA M1 and bilateral MCA branches. Electronically Signed   By: Genevie Ann M.D.   On: 09/13/2020 17:00   MR BRAIN WO CONTRAST  Result Date: 09/13/2020 CLINICAL DATA:  Transient ischemic attack (TIA). Additional history provided: Acute onset of decreased responsiveness with associated hypoglycemia. EXAM: MRI HEAD WITHOUT CONTRAST TECHNIQUE: Multiplanar, multiecho pulse sequences of the brain and surrounding structures were obtained without intravenous contrast. COMPARISON:  Head CT 09/12/2020.  Brain MRI 06/19/2017. FINDINGS: Brain: Mild intermittent motion degradation. Stable mild-to-moderate generalized parenchymal atrophy. There is a subcentimeter  focus of restricted diffusion consistent with acute infarction within the subcortical white matter of the right temporal occipital lobe (series 5, image 27). Additional subcentimeter focus of mild restricted diffusion within the right parietal white matter consistent with acute or subacute infarct (series 5, image 32). Moderate/advanced multifocal T2/FLAIR hyperintensity within the cerebral white matter, nonspecific but consistent with chronic small vessel ischemic disease. These findings have progressed from the prior MRI of 06/19/2017. Known chronic infarcts within the left external capsule, thalami and pons. There are a few scattered supratentorial and infratentorial chronic microhemorrhages, more numerous as compared to the prior MRI. No evidence of intracranial mass. No extra-axial fluid collection. No midline shift. Vascular: Expected proximal arterial flow voids. Skull and upper cervical spine: No focal marrow lesion. Sinuses/Orbits: Visualized orbits show no acute finding. Trace ethmoid sinus mucosal thickening. Small bilateral mastoid effusions. IMPRESSION: Mildly motion degraded examination. Subcentimeter acute infarct within the right temporal occipital subcortical white matter. Additional subcentimeter acute or subacute infarct within the right parietal lobe white matter. Moderate/advanced cerebral white matter chronic small vessel ischemic disease, progressed as compared to the MRI of 06/19/2017. Known chronic infarcts within the left corona radiata/external capsule, thalami and pons. Stable, mild to moderate generalized parenchymal atrophy. 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 Electronically Signed   By: Kellie Simmering DO   On: 09/13/2020 12:59   US Carotid Bilateral (at [facility] and [facility] only)  Result Date: 09/13/2020 CLINICAL DATA:  69 year old female with a history of  TIA EXAM: BILATERAL CAROTID DUPLEX ULTRASOUND TECHNIQUE: Pearline Cables scale imaging, color Doppler and duplex ultrasound were performed of bilateral carotid and vertebral arteries in the neck. COMPARISON:  None. FINDINGS: Criteria: Quantification of carotid stenosis is based on velocity parameters that correlate the residual internal carotid diameter with NASCET-based stenosis levels, using the diameter of the distal internal carotid lumen as the denominator for stenosis measurement. The following velocity measurements were obtained: RIGHT ICA:  Systolic 628 cm/sec, Diastolic 15 cm/sec CCA:  68 cm/sec SYSTOLIC ICA/CCA RATIO:  1.7 ECA:  202 cm/sec LEFT ICA:  Systolic 366 cm/sec, Diastolic 19 cm/sec CCA:  87 cm/sec SYSTOLIC ICA/CCA RATIO:  1.7 ECA:  295 cm/sec Right Brachial SBP: Not acquired Left Brachial SBP: Not acquired RIGHT CAROTID ARTERY: No significant calcifications of the right common carotid artery. Intermediate waveform maintained. Moderate heterogeneous and partially calcified plaque at the right carotid bifurcation. Shadowing present. Low resistance waveform of the right ICA. No significant tortuosity. RIGHT VERTEBRAL ARTERY: Antegrade flow with low resistance waveform. LEFT CAROTID ARTERY: No significant calcifications of the left common carotid artery. Intermediate waveform maintained. Moderate heterogeneous and partially calcified plaque at the left carotid bifurcation. Shadowing present. Low resistance waveform of the left ICA. No significant tortuosity. LEFT VERTEBRAL ARTERY:  Antegrade flow with low resistance waveform. IMPRESSION: Right: Color  duplex indicates moderate heterogeneous and calcified plaque, with no hemodynamically significant stenosis by duplex criteria in the extracranial cerebrovascular circulation. Left: Heterogeneous and partially calcified plaque at the left carotid bifurcation, with discordant results regarding degree of stenosis by established duplex criteria. Peak velocity suggests  50%-69% stenosis, with the ICA/ CCA ratio suggesting a lesser degree of stenosis. If establishing a more accurate degree of stenosis is required, cerebral angiogram should be considered, or as a second best test, CTA. Signed, Dulcy Fanny. Dellia Nims, RPVI [facility] Electronically Signed   By: Corrie Mckusick D.O.   On: 09/13/2020 14:15   PERIPHERAL VASCULAR CATHETERIZATION  Result Date: 09/01/2020 See Op note  DG Tibia/Fibula Left Port  Result Date: 08/29/2020 CLINICAL DATA:  Foot wound infection of the anterior distal tibia-fibula, left heel, and left calf EXAM: LEFT FOOT - 2 VIEW; PORTABLE LEFT TIBIA AND FIBULA - 2 VIEW COMPARISON:  X-ray 10/13/2015 FINDINGS: Bones are markedly demineralized. There is a chronic appearing fracture deformity of the distal tibial metaphysis with varus angulation. 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 No knee joint effusion. No acute fracture is identified. No evidence of cortical destruction or periostitis to suggest osteomyelitis. Soft tissue ulcerations at the posterolateral aspect of the calf and plantar heel. Marked soft tissue swelling of the foot. Severe extensive atherosclerotic calcifications. IMPRESSION: 1. Soft tissue ulcerations of the calf and plantar heel. No radiographic evidence of osteomyelitis. 2. 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 3. Marked soft tissue swelling of the foot. 4. Chronic appearing fracture deformity of the distal tibial metaphysis with varus angulation. Electronically Signed   By: Davina Poke D.O.   On: 08/29/2020 13:16   DG Foot 2 Views Left  Result Date: 08/29/2020 CLINICAL DATA:  Foot wound infection of the anterior distal tibia-fibula, left heel, and left calf EXAM: LEFT FOOT - 2 VIEW; PORTABLE LEFT TIBIA AND FIBULA - 2 VIEW COMPARISON:  X-ray 10/13/2015 FINDINGS: Bones are markedly demineralized. There is a chronic appearing  fracture deformity of the distal tibial metaphysis with varus angulation. 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 No knee joint effusion. No acute fracture is identified. No evidence of cortical destruction or periostitis to suggest osteomyelitis. Soft tissue ulcerations at the posterolateral aspect of the calf and plantar heel. Marked soft tissue swelling of the foot. Severe extensive atherosclerotic calcifications. IMPRESSION: 1. Soft tissue ulcerations of the calf and plantar heel. No radiographic evidence of osteomyelitis. 2. 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 3. Marked soft tissue swelling of the foot. 4. Chronic appearing fracture deformity of the distal tibial metaphysis with varus angulation. Electronically Signed   By: Davina Poke D.O.   On: 08/29/2020 13:16   ECHOCARDIOGRAM COMPLETE  Result Date: 09/13/2020    ECHOCARDIOGRAM REPORT   Patient Name:   Brooke Hopkins Date of Exam: 09/13/2020 Medical Rec #:  [id number]         Height:       67.0 in Accession #:    [id number]        Weight:       280.0 lb Date of Birth:  Apr 06, 1951         BSA:          2.333 m Patient Age:    28 years          BP:           167/56 mmHg Patient Gender: F  HR:           74 bpm. Exam Location:  [facility] Procedure: 2D Echo, Cardiac Doppler and Color Doppler Indications:     TIA 435.9  History:         Patient has prior history of Echocardiogram examinations, most                  recent 06/19/2017. Stroke; Risk Factors:Hypertension and                  Diabetes.  Sonographer:     Sherrie Sport RDCS (AE) Referring Phys:  [id number] [place] [facility] Diagnosing Phys: Kate Sable MD  Sonographer Comments: Technically challenging study due to limited acoustic windows, no subcostal window and no apical window. IMPRESSIONS  1. Left ventricular ejection fraction, by estimation, is 60 to 65%. The left ventricle has normal function. The left ventricle has no regional wall  motion abnormalities. Left ventricular diastolic function could not be evaluated.  2. Right ventricular systolic function is normal. The right ventricular size is normal.  3. The mitral valve is grossly normal. No evidence of mitral valve regurgitation.  4. Tricuspid valve regurgitation not assessed.  5. The aortic valve was not well visualized. Aortic valve regurgitation is not visualized. FINDINGS  Left Ventricle: Left ventricular ejection fraction, by estimation, is 60 to 65%. The left ventricle has normal function. The left ventricle has no regional wall motion abnormalities. The left ventricular internal cavity size was normal in size. There is  no left ventricular hypertrophy. Left ventricular diastolic function could not be evaluated. Right Ventricle: The right ventricular size is normal. No increase in right ventricular wall thickness. Right ventricular systolic function is normal. Left Atrium: Left atrial size was normal in size. Right Atrium: Right atrial size was not well visualized. Pericardium: There is no evidence of pericardial effusion. Mitral Valve: The mitral valve is grossly normal. No evidence of mitral valve regurgitation. Tricuspid Valve: The tricuspid valve is not well visualized. Tricuspid valve regurgitation not assessed. Aortic Valve: The aortic valve was not well visualized. Aortic valve regurgitation is not visualized. Pulmonic Valve: The pulmonic valve was not well visualized. Pulmonic valve regurgitation is not visualized. Aorta: The aortic root was not well visualized. Venous: The inferior vena cava was not well visualized. IAS/Shunts: The interatrial septum was not assessed.  LEFT VENTRICLE PLAX 2D LVIDd:         4.34 cm LVIDs:         3.11 cm LV PW:         1.98 cm LV IVS:        1.50 cm LVOT diam:     2.00 cm LVOT Area:     3.14 cm  LEFT ATRIUM         Index LA diam:    3.70 cm 1.59 cm/m                        PULMONIC VALVE AORTA                 PV Vmax:        1.21 m/s Ao Root  diam: 2.80 cm PV Peak grad:   5.9 mmHg                       RVOT Peak grad: 7 mmHg   SHUNTS Systemic Diam: 2.00 cm Kate Sable MD Electronically signed by Kate Sable MD Signature Date/Time: 09/13/2020/4:05:24  PM    Final     Microbiology: Recent Results (from the past 240 hour(s))  Resp Panel by RT PCR (RSV, Flu A&B, Covid) - Nasopharyngeal Swab     Status: None   Collection Time: 09/13/20  5:39 AM   Specimen: Nasopharyngeal Swab  Result Value Ref Range Status   SARS Coronavirus 2 by RT PCR NEGATIVE NEGATIVE Final    Comment: (NOTE) SARS-CoV-2 target nucleic acids are NOT DETECTED.  The SARS-CoV-2 RNA is generally detectable in upper respiratoy specimens during the acute phase of infection. The lowest concentration of SARS-CoV-2 viral copies this assay can detect is 131 copies/mL. A negative result does not preclude SARS-Cov-2 infection and should not be used as the sole basis for treatment or other patient management decisions. A negative result may occur with  improper specimen collection/handling, submission of specimen other than nasopharyngeal swab, presence of viral mutation(s) within the areas targeted by this assay, and inadequate number of viral copies (<131 copies/mL). A negative result must be combined with clinical observations, patient history, and epidemiological information. The expected result is Negative.  Fact Sheet for Patients:  PinkCheek.be  Fact Sheet for Healthcare Providers:  GravelBags.it  This test is no t yet approved or cleared by the [place] FDA and  has been authorized for detection and/or diagnosis of SARS-CoV-2 by FDA under an Emergency Use Authorization (EUA). This EUA will remain  in effect (meaning this test can be used) for the duration of the COVID-19 declaration under Section 564(b)(1) of the Act, 21 U.S.C. section 360bbb-3(b)(1), unless the authorization is  terminated or revoked sooner.     Influenza A by PCR NEGATIVE NEGATIVE Final   Influenza B by PCR NEGATIVE NEGATIVE Final    Comment: (NOTE) The Xpert Xpress SARS-CoV-2/FLU/RSV assay is intended as an aid in  the diagnosis of influenza from Nasopharyngeal swab specimens and  should not be used as a sole basis for treatment. Nasal washings and  aspirates are unacceptable for Xpert Xpress SARS-CoV-2/FLU/RSV  testing.  Fact Sheet for Patients: PinkCheek.be  Fact Sheet for Healthcare Providers: GravelBags.it  This test is not yet approved or cleared by the [place] FDA and  has been authorized for detection and/or diagnosis of SARS-CoV-2 by  FDA under an Emergency Use Authorization (EUA). This EUA will remain  in effect (meaning this test can be used) for the duration of the  Covid-19 declaration under Section 564(b)(1) of the Act, 21  U.S.C. section 360bbb-3(b)(1), unless the authorization is  terminated or revoked.    Respiratory Syncytial Virus by PCR NEGATIVE NEGATIVE Final    Comment: (NOTE) Fact Sheet for Patients: PinkCheek.be  Fact Sheet for Healthcare Providers: GravelBags.it  This test is not yet approved or cleared by the [place] FDA and  has been authorized for detection and/or diagnosis of SARS-CoV-2 by  FDA under an Emergency Use Authorization (EUA). This EUA will remain  in effect (meaning this test can be used) for the duration of the  COVID-19 declaration under Section 564(b)(1) of the Act, 21 U.S.C.  section 360bbb-3(b)(1), unless the authorization is terminated or  revoked. Performed at [facility], [address] [place], [place] [address]      Labs: Basic Metabolic Panel: Recent Labs  Lab 09/12/20 1653 09/13/20 0539  NA 141 140  K 4.1 4.1  CL 105 104  CO2 26 27  GLUCOSE 85 152*  BUN 18 18  CREATININE 1.04*  1.03*  CALCIUM 8.4* 8.3*   Liver  Function Tests: Recent Labs  Lab 09/12/20 1653  AST 12*  ALT 11  ALKPHOS 59  BILITOT 0.4  PROT 6.4*  ALBUMIN 2.7*   No results for input(s): LIPASE, AMYLASE in the last 168 hours. No results for input(s): AMMONIA in the last 168 hours. CBC: Recent Labs  Lab 09/12/20 1653 09/13/20 0539  WBC 9.4 9.0  NEUTROABS 7.5  --   HGB 10.8* 10.1*  HCT 33.4* 31.6*  MCV 83.7 85.6  PLT 375 342   Cardiac Enzymes: No results for input(s): CKTOTAL, CKMB, CKMBINDEX, TROPONINI in the last 168 hours. BNP: BNP (last 3 results) Recent Labs    09/13/20 1046  BNP 233.8*    ProBNP (last 3 results) No results for input(s): PROBNP in the last 8760 hours.  CBG: Recent Labs  Lab 09/13/20 0858 09/13/20 1205 09/13/20 1639 09/13/20 1955 09/14/20 0723  GLUCAP 154* 206* 142* 184* 122*       Signed:  Annita Brod, MD [facility] 09/14/2020, 10:30 AM

## 2020-09-14 NOTE — ED Notes (Signed)
Pt  Eating breakfast with husband at bedside

## 2020-09-14 NOTE — ED Notes (Signed)
Pt discharged home after verbalizing understanding of discharge instructions; nad noted. 

## 2020-09-14 NOTE — ED Notes (Signed)
This nurse called to room by SO saying pt is "choking." Pt vomiting upon arrival to room. Pt states this happens "sometimes" when she first starts eating.  pt advised to wait a bit and try again.

## 2020-09-14 NOTE — TOC Initial Note (Signed)
Transition of Care [facility]) - Initial/Assessment Note    Patient Details  Name: Brooke Hopkins MRN: [id number] Date of Birth: August 05, 1951  [facility] CM/SW Contact:    Anselm Pancoast, RN Phone Number: 09/14/2020, 10:12 AM  Clinical Narrative:                 Spoke to patient and spouse at bedside. Patient lives at home with spouse who is primary caregiver 24/7. Patient reports being bedbound for the last 3 months due to increased weakness. Has handicap Lucianne Lei that she is able to use as well as [facility] transportation to MD appointments. Patient is active with [facility] for RN, PT and OT. Spouse reports needing a BSC. Spouse reports he is able to get groceries and medications without difficulty.   RN CM notified [place] with [facility] of patient being in emergency department.  RN CM notified Mardene Celeste with [employer] of need for [facility]. Family requests equipment be sent to home address.    Expected Discharge Plan: [place] Barriers to Discharge: No Barriers Identified   Patient Goals and CMS Choice Patient states their goals for this hospitalization and ongoing recovery are:: Get home      Expected Discharge Plan and Services Expected Discharge Plan: [place] Choice: [place] arrangements for the past 2 months: Single Family Home Expected Discharge Date: 09/14/20               DME Arranged: Bedside commode DME Agency: [facility] Date DME Agency Contacted: 09/14/20   Representative spoke with at DME Agency: Mardene Celeste HH Arranged: PT, OT, RN [facility] Agency: [facility] Date [place]: 09/14/20 Time HH Agency Contacted: 1010 Representative spoke with at [place]: [place]  Prior Living Arrangements/Services Living arrangements for the past 2 months: [place] with:: Spouse Patient language and need for interpreter reviewed:: Yes Do you feel safe going back to the place where  you live?: Yes      Need for Family Participation in Patient Care: Yes (Comment) Care giver support system in place?: Yes (comment) Current home services: Home OT, Home PT, Home RN, DME Criminal Activity/Legal Involvement Pertinent to Current Situation/Hospitalization: No - Comment as needed  Activities of Daily Living      Permission Sought/Granted                  Emotional Assessment Appearance:: Appears stated age Attitude/Demeanor/Rapport: Engaged Affect (typically observed): Accepting Orientation: : Oriented to Self, Oriented to Place, Oriented to  Time, Oriented to Situation Alcohol / Substance Use: Not Applicable Psych Involvement: No (comment)  Admission diagnosis:  Hypoglycemia [E16.2] Acute CVA (cerebrovascular accident) [facility]) [I63.9] Patient Active Problem List   Diagnosis Date Noted  . Hypoglycemia 09/13/2020  . Acute metabolic encephalopathy 62/83/6629  . Acute CVA (cerebrovascular accident) ([place]) 09/13/2020  . Morbid obesity ([place]) 09/13/2020  . Decubitus ulcer 08/29/2020  . Essential hypertension   . Type 2 diabetes mellitus with foot ulcer ([place])   . UTI (urinary tract infection) 06/16/2017  . Hypertensive urgency 06/16/2017  . Pure hypercholesterolemia 08/17/2016  . Lymphedema 08/17/2016  . Wound infection 08/16/2016  . 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 Pressure ulcer 10/17/2015  . Cellulitis 10/13/2015   PCP:  Lynnell Jude, MD Pharmacy:   [id number] [place], [place] [address] [place]  [place] Phone: [phone] Fax: [phone]     Social Determinants of Health (SDOH) Interventions    Readmission Risk Interventions No flowsheet data found.

## 2020-09-15 ENCOUNTER — Encounter (INDEPENDENT_AMBULATORY_CARE_PROVIDER_SITE_OTHER): Payer: Medicare HMO | Admitting: Nurse Practitioner

## 2020-09-15 ENCOUNTER — Encounter (INDEPENDENT_AMBULATORY_CARE_PROVIDER_SITE_OTHER): Payer: Medicare HMO

## 2020-09-26 ENCOUNTER — Other Ambulatory Visit (INDEPENDENT_AMBULATORY_CARE_PROVIDER_SITE_OTHER): Payer: Self-pay | Admitting: Vascular Surgery

## 2020-09-26 DIAGNOSIS — Z95820 Peripheral vascular angioplasty status with implants and grafts: Secondary | ICD-10-CM

## 2020-09-27 ENCOUNTER — Ambulatory Visit (INDEPENDENT_AMBULATORY_CARE_PROVIDER_SITE_OTHER): Payer: Medicare HMO | Admitting: Nurse Practitioner

## 2020-09-27 ENCOUNTER — Other Ambulatory Visit: Payer: Self-pay

## 2020-09-27 ENCOUNTER — Telehealth (INDEPENDENT_AMBULATORY_CARE_PROVIDER_SITE_OTHER): Payer: Self-pay

## 2020-09-27 ENCOUNTER — Other Ambulatory Visit (INDEPENDENT_AMBULATORY_CARE_PROVIDER_SITE_OTHER): Payer: Self-pay | Admitting: Vascular Surgery

## 2020-09-27 ENCOUNTER — Encounter (INDEPENDENT_AMBULATORY_CARE_PROVIDER_SITE_OTHER): Payer: Self-pay | Admitting: Nurse Practitioner

## 2020-09-27 ENCOUNTER — Ambulatory Visit (INDEPENDENT_AMBULATORY_CARE_PROVIDER_SITE_OTHER): Payer: Medicare HMO

## 2020-09-27 VITALS — BP 84/52 | HR 150 | Ht 67.0 in | Wt 280.0 lb

## 2020-09-27 DIAGNOSIS — Z95820 Peripheral vascular angioplasty status with implants and grafts: Secondary | ICD-10-CM

## 2020-09-27 DIAGNOSIS — N200 Calculus of kidney: Secondary | ICD-10-CM | POA: Insufficient documentation

## 2020-09-27 DIAGNOSIS — E785 Hyperlipidemia, unspecified: Secondary | ICD-10-CM

## 2020-09-27 DIAGNOSIS — L97429 Non-pressure chronic ulcer of left heel and midfoot with unspecified severity: Secondary | ICD-10-CM

## 2020-09-27 DIAGNOSIS — L89896 Pressure-induced deep tissue damage of other site: Secondary | ICD-10-CM

## 2020-09-27 DIAGNOSIS — R32 Unspecified urinary incontinence: Secondary | ICD-10-CM | POA: Insufficient documentation

## 2020-09-27 DIAGNOSIS — I739 Peripheral vascular disease, unspecified: Secondary | ICD-10-CM

## 2020-09-27 DIAGNOSIS — E11311 Type 2 diabetes mellitus with unspecified diabetic retinopathy with macular edema: Secondary | ICD-10-CM | POA: Insufficient documentation

## 2020-09-27 DIAGNOSIS — I878 Other specified disorders of veins: Secondary | ICD-10-CM

## 2020-09-27 DIAGNOSIS — I10 Essential (primary) hypertension: Secondary | ICD-10-CM

## 2020-09-27 DIAGNOSIS — E6609 Other obesity due to excess calories: Secondary | ICD-10-CM | POA: Insufficient documentation

## 2020-09-27 MED ORDER — CIPROFLOXACIN HCL 750 MG PO TABS: 750.0000 mg | ORAL_TABLET | Freq: Two times a day (BID) | ORAL | 0 refills | Status: DC

## 2020-09-27 MED ORDER — COLLAGENASE 250 UNIT/GM EX OINT: TOPICAL_OINTMENT | Freq: Every day | CUTANEOUS | 0 refills | Status: AC

## 2020-09-27 NOTE — Telephone Encounter (Signed)
Brooke Hopkins called an left a message on the nurses line the call was returned and she wanted to know did the pt come to her appointment today and could we fax over the office note and the U/S results. The office notes were faxed over I called and made claudia aware that the U/S results would have to wait until 9/30 so the Provider can sign off on them.

## 2020-09-27 NOTE — Progress Notes (Signed)
Subjective:    [name], female    DOB: [birth year], 69 y.o.   MRN: [id number] Chief Complaint  Patient presents with  . Follow-up    [facility] FU U/S     The patient presents today after recent discharge from [facility] due to lower extremity wounds and subsequent infection.  The patient has a previous history of CVA and was in her usual state of health when it was noticed that her left lower extremity wound began to have drainage and was foul-smelling in nature.  Patient is typically bedbound and dependent following her CVA.  Initially the patient had what she noted was a small lesion and they tried Neosporin and other superficial dressings without any improvement to the wound.  These wounds have now grown greatly in size and become severe.  There is a wound on the left lateral portion of her lower extremity.  In addition there is a wound on her left heel.  The patient underwent angiogram on 09/01/2020 and subsequently had angioplasty and stent placement to the left popliteal artery with angioplasty to the left anterior tibial artery.  Following angiogram it is noted that the patient has a one-vessel runoff.  The patient has been getting home health but has noted that she has not been able to obtain all the necessary wound care supplies.  She also notes that she has been having some continued foul-smelling drainage from her heel wound.  She denies any systemic signs of infection such as fever, chills, nausea, vomiting or diarrhea.  They note that they have been utilizing Santyl at her wound bed with dry dressings and no other dressings at the time.  The today noninvasive studies show noncompressible ABIs.  The right lower extremity has a toe pressure of 103 while the left has a toe pressure of 93.  The patient has biphasic waveforms detected in the anterior tibial artery with the posterior tibial and peroneal arteries not detected.  The right lower extremity has  biphasic waveforms also in the anterior tibial artery with posterior tibial level occlusive disease.  There are dampened toe waveforms bilaterally.  The patient also underwent a venous reflux study which showed no evidence of DVT or superficial venous thrombosis bilaterally.  The left lower extremity does have evidence of reflux in the left common femoral vein in addition to the great saphenous vein and the distal thigh and knee areas.      Review of Systems  Cardiovascular: Positive for leg swelling.  Skin: Positive for wound.  Neurological: Positive for weakness.       Objective:   Physical Exam HENT:     Head: Normocephalic.  Feet:     Left foot:     Skin integrity: Ulcer present.  Skin:    Comments: Ulceration lower left extremity see attached pictures  Neurological:     Mental Status: She is alert and oriented to person, place, and time.     Motor: Weakness present.     Gait: Gait abnormal.  Psychiatric:        Mood and Affect: Mood normal.        Behavior: Behavior normal.        Thought Content: Thought content normal.        Judgment: Judgment normal.     Left lateral wound    Heel wound   BP (!) 84/52   Pulse (!) 150   Ht 5\' 7"  (1.702 m)   Wt 280  lb (127 kg)   BMI 43.85 kg/m   Past Medical History:  Diagnosis Date  . Chronic kidney disease   . Diabetes mellitus   . Hypertension   . Stroke [facility])     Social History   Socioeconomic History  . Marital status: Married    Spouse name: Not on file  . Number of children: Not on file  . Years of education: Not on file  . Highest education level: Not on file  Occupational History  . Not on file  Tobacco Use  . Smoking status: Never Smoker  . Smokeless tobacco: Never Used  Vaping Use  . Vaping Use: Never used  Substance and Sexual Activity  . Alcohol use: No  . Drug use: No  . Sexual activity: Yes    Birth control/protection: Post-menopausal  Other Topics Concern  . Not on file  Social  History Narrative  . Not on file   Social Determinants of Health   Financial Resource Strain:   . Difficulty of Paying Living Expenses: Not on file  Food Insecurity:   . Worried About Charity fundraiser in the Last Year: Not on file  . Ran Out of Food in the Last Year: Not on file  Transportation Needs:   . Lack of Transportation (Medical): Not on file  . Lack of Transportation (Non-Medical): Not on file  Physical Activity:   . Days of Exercise per Week: Not on file  . Minutes of Exercise per Session: Not on file  Stress:   . Feeling of Stress : Not on file  Social Connections:   . Frequency of Communication with Friends and Family: Not on file  . Frequency of Social Gatherings with Friends and Family: Not on file  . Attends Religious Services: Not on file  . Active Member of Clubs or Organizations: Not on file  . Attends Archivist Meetings: Not on file  . Marital Status: Not on file  Intimate Partner Violence:   . Fear of Current or Ex-Partner: Not on file  . Emotionally Abused: Not on file  . Physically Abused: Not on file  . Sexually Abused: Not on file    Past Surgical History:  Procedure Laterality Date  . ABDOMINAL HYSTERECTOMY    . CHOLECYSTECTOMY    . HAMMER TOE SURGERY    . KNEE ARTHROSCOPY    . LOWER EXTREMITY ANGIOGRAPHY Left 09/01/2020   Procedure: Lower Extremity Angiography;  Surgeon: Katha Cabal, MD;  Location: [facility];  Service: Cardiovascular;  Laterality: Left;  . LUMBAR DISC SURGERY  11/07/11  . LUMBAR WOUND DEBRIDEMENT  11/27/2011   Procedure: LUMBAR WOUND DEBRIDEMENT;  Surgeon: Eustace Moore;  Location: [facility];  Service: Neurosurgery;  Laterality: N/A;  . TONSILLECTOMY      Family History  Problem Relation Age of Onset  . CAD Mother   . Hypertension Mother   . COPD Father   . Diabetes Sister   . Hypertension Sister     No Known Allergies     Assessment & Plan:   1. PAD (peripheral artery disease)  ([place]) Based on noninvasive studies the patient likely has marginal wound healing based on the patient's perfusion.  Following angiogram the patient does have one-vessel runoff in her left lower extremity.  We will maintain close follow-up to see if the wound is continuing to heal or if further revascularization will be necessary.  2. Essential hypertension Continue antihypertensive medications as already ordered, these medications  have been reviewed and there are no changes at this time.   3. Hyperlipidemia, unspecified hyperlipidemia type Continue statin as ordered and reviewed, no changes at this time   4. Pressure injury of deep tissue of left calf The patient does have a large wound of the left calf.  We will try to contact her home health agency to give instruction to apply Santyl ointment daily with a calcium alginate dressing (alternatively a Aquacel Ag dressing) to see how this helps with wound healing.  There is an area of eschar and slough in the medial portion if at follow-up this is not showing improvement we will likely need to consider surgical debridement.  If it is showing signs symptoms of improvement we may also discuss possible wound care referral. - collagenase (SANTYL) ointment; Apply topically daily.  Dispense: 15 g; Refill: 0  5. Heel ulceration, left, with unspecified severity ([place]) Patient does have a relatively large heel ulceration with signs and symptoms of eschar present.  The patient does have drainage which is concerning for possible pseudomonal infection given the color of the discharge in addition to the smell.  We will place the patient on ciprofloxacin to see if this helps to clear the infection.  Based on the level of eschar the patient will likely need debridement of her heel as well.  Therefore we will place referral to podiatry for further work-up of her heel wound. - ciprofloxacin (CIPRO) 750 MG tablet; Take 1 tablet (750 mg total) by mouth 2 (two) times  daily.  Dispense: 28 tablet; Refill: 0 - Ambulatory referral to Podiatry    Current Outpatient Medications on File Prior to Visit  Medication Sig Dispense Refill  . aspirin EC 81 MG EC tablet Take 1 tablet (81 mg total) by mouth daily. Swallow whole. 30 tablet 11  . atorvastatin (LIPITOR) 80 MG tablet Take 1 tablet (80 mg total) by mouth daily at 6 PM. 30 tablet 0  . cloNIDine (CATAPRES - DOSED IN MG/24 HR) 0.2 mg/24hr patch Place 1 patch (0.2 mg total) onto the skin once a week. 4 patch 0  . clopidogrel (PLAVIX) 75 MG tablet Take 1 tablet (75 mg total) by mouth daily. 30 tablet 2  . collagenase (SANTYL) ointment Apply topically daily. 15 g 0  . furosemide (LASIX) 20 MG tablet Take 1 tablet (20 mg total) by mouth daily. 30 tablet 11  . hydrALAZINE (APRESOLINE) 25 MG tablet Take 1 tablet (25 mg total) by mouth every 8 (eight) hours. 90 tablet 0  . insulin NPH-regular Human (NOVOLIN 70/30) (70-30) 100 UNIT/ML injection Inject 10 Units into the skin 2 (two) times daily with a meal. 10 mL 1  . labetalol (NORMODYNE) 100 MG tablet Take 1 tablet (100 mg total) by mouth 2 (two) times daily. 60 tablet 2  . lisinopril (ZESTRIL) 5 MG tablet Take 1 tablet (5 mg total) by mouth daily. 30 tablet 2   No current facility-administered medications on file prior to visit.    There are no Patient Instructions on file for this visit. No follow-ups on file.   Kris Hartmann, NP

## 2020-09-27 NOTE — Telephone Encounter (Signed)
Brooke Hopkins called the nurses line and left a message saying that the pt was given a Rx for Aquacel and she would like to continue with santyl  An add microbial wound cleaner

## 2020-09-27 NOTE — Telephone Encounter (Deleted)
Mee Hives called and left a VM on the nurse's line

## 2020-09-28 ENCOUNTER — Telehealth (INDEPENDENT_AMBULATORY_CARE_PROVIDER_SITE_OTHER): Payer: Self-pay

## 2020-09-28 NOTE — Telephone Encounter (Signed)
I called and spoke to Brooke Hopkins and made her aware that the description of the U/S is in the NP note that I faxed she also made me aware that the pt needs a refill on santyl and that she is having issue with not eating and nausea and vomiting and diarrhea and blood sugar issue's potential c diff.

## 2020-09-28 NOTE — Telephone Encounter (Signed)
I sent a refill in yesterday for the patient.  As far as the patient's BS we don't manage that, the best person to contact would be her PCP.  That may be causing the patient's n/v if they are out of control.  If she has potential Cdiff, her PCP may be able to manage that or they may advise that she return to the hospital as she could become severely dehydrated and need inpatient management.

## 2020-09-28 NOTE — Telephone Encounter (Signed)
She wants a verbal order

## 2020-09-28 NOTE — Telephone Encounter (Signed)
I called Brooke Hopkins back and made her aware and she said  that the plan is to try to reach the pts PCP to avoid a hospitalization and that she would keep [place] updated  because the pt is not eating as well.

## 2020-09-28 NOTE — Telephone Encounter (Signed)
I called Brooke Hopkins back and made her aware of the NP instructions by VM.

## 2020-09-28 NOTE — Telephone Encounter (Signed)
That is fine 

## 2020-09-28 NOTE — Telephone Encounter (Signed)
Brooke Hopkins wants to order 180 grams due to insurance

## 2020-10-25 ENCOUNTER — Ambulatory Visit (INDEPENDENT_AMBULATORY_CARE_PROVIDER_SITE_OTHER): Payer: Medicare HMO | Admitting: Nurse Practitioner

## 2020-10-25 ENCOUNTER — Other Ambulatory Visit: Payer: Self-pay

## 2020-10-25 ENCOUNTER — Encounter (INDEPENDENT_AMBULATORY_CARE_PROVIDER_SITE_OTHER): Payer: Self-pay | Admitting: Nurse Practitioner

## 2020-10-25 VITALS — BP 99/60 | HR 98 | Resp 16

## 2020-10-25 DIAGNOSIS — E11621 Type 2 diabetes mellitus with foot ulcer: Secondary | ICD-10-CM | POA: Diagnosis not present

## 2020-10-25 DIAGNOSIS — L97509 Non-pressure chronic ulcer of other part of unspecified foot with unspecified severity: Secondary | ICD-10-CM | POA: Diagnosis not present

## 2020-10-25 DIAGNOSIS — Z794 Long term (current) use of insulin: Secondary | ICD-10-CM

## 2020-10-25 DIAGNOSIS — L89896 Pressure-induced deep tissue damage of other site: Secondary | ICD-10-CM | POA: Diagnosis not present

## 2020-10-25 DIAGNOSIS — I10 Essential (primary) hypertension: Secondary | ICD-10-CM

## 2020-10-30 ENCOUNTER — Encounter (INDEPENDENT_AMBULATORY_CARE_PROVIDER_SITE_OTHER): Payer: Self-pay | Admitting: Nurse Practitioner

## 2020-10-30 NOTE — Progress Notes (Signed)
Subjective:    [name], female    DOB: 30-Mar-1951, 69 y.o.   MRN: [id number] Chief Complaint  Patient presents with  . Follow-up    4wk wound check    Patient returns today for evaluation of left lower extremity wounds.  Today evidence of infection has been resolved however the patient does continue to have weeping from his wound.  Wound care is being provided by home health.  The wound does not appear to have grown larger however the area of eschar appears to have grown somewhat.  The patient remains largely bedbound and dependent following a CVA.  She notes that previous infections have improved.  She denies any fever, chills, nausea, vomiting or diarrhea.  The patient has not yet been able to see podiatry.   Review of Systems  Cardiovascular: Positive for leg swelling.  Skin: Positive for wound.  Neurological: Positive for weakness.  All other systems reviewed and are negative.      Objective:   Physical Exam Vitals reviewed.  HENT:     Head: Normocephalic.  Cardiovascular:     Rate and Rhythm: Normal rate.     Pulses: Normal pulses.  Pulmonary:     Effort: Pulmonary effort is normal.  Musculoskeletal:        General: Signs of injury present.     Right lower leg: Edema present.     Left lower leg: Edema present.  Skin:    Capillary Refill: Capillary refill takes less than 2 seconds.  Neurological:     Mental Status: She is alert and oriented to person, place, and time.  Psychiatric:        Mood and Affect: Mood normal.        Behavior: Behavior normal.        Thought Content: Thought content normal.        Judgment: Judgment normal.     BP 99/60 (BP Location: Right Arm)   Pulse 98   Resp 16   Past Medical History:  Diagnosis Date  . Chronic kidney disease   . Diabetes mellitus   . Hypertension   . Stroke [facility])     Social History   Socioeconomic History  . Marital status: Married    Spouse name: Not on file  . Number of  children: Not on file  . Years of education: Not on file  . Highest education level: Not on file  Occupational History  . Not on file  Tobacco Use  . Smoking status: Never Smoker  . Smokeless tobacco: Never Used  Vaping Use  . Vaping Use: Never used  Substance and Sexual Activity  . Alcohol use: No  . Drug use: No  . Sexual activity: Yes    Birth control/protection: Post-menopausal  Other Topics Concern  . Not on file  Social History Narrative  . Not on file   Social Determinants of Health   Financial Resource Strain:   . Difficulty of Paying Living Expenses: Not on file  Food Insecurity:   . Worried About Charity fundraiser in the Last Year: Not on file  . Ran Out of Food in the Last Year: Not on file  Transportation Needs:   . Lack of Transportation (Medical): Not on file  . Lack of Transportation (Non-Medical): Not on file  Physical Activity:   . Days of Exercise per Week: Not on file  . Minutes of Exercise per Session: Not on file  Stress:   .  Feeling of Stress : Not on file  Social Connections:   . Frequency of Communication with Friends and Family: Not on file  . Frequency of Social Gatherings with Friends and Family: Not on file  . Attends Religious Services: Not on file  . Active Member of Clubs or Organizations: Not on file  . Attends Archivist Meetings: Not on file  . Marital Status: Not on file  Intimate Partner Violence:   . Fear of Current or Ex-Partner: Not on file  . Emotionally Abused: Not on file  . Physically Abused: Not on file  . Sexually Abused: Not on file    Past Surgical History:  Procedure Laterality Date  . ABDOMINAL HYSTERECTOMY    . CHOLECYSTECTOMY    . HAMMER TOE SURGERY    . KNEE ARTHROSCOPY    . LOWER EXTREMITY ANGIOGRAPHY Left 09/01/2020   Procedure: Lower Extremity Angiography;  Surgeon: Katha Cabal, MD;  Location: [place] [facility];  Service: Cardiovascular;  Laterality: Left;  . LUMBAR DISC SURGERY   11/07/11  . LUMBAR WOUND DEBRIDEMENT  11/27/2011   Procedure: LUMBAR WOUND DEBRIDEMENT;  Surgeon: Eustace Moore;  Location: [place] [facility];  Service: Neurosurgery;  Laterality: N/A;  . TONSILLECTOMY      Family History  Problem Relation Age of Onset  . CAD Mother   . Hypertension Mother   . COPD Father   . Diabetes Sister   . Hypertension Sister     No Known Allergies  CBC Latest Ref Rng & Units 09/13/2020 09/12/2020 09/02/2020  WBC 4.0 - 10.5 K/uL 9.0 9.4 8.6  Hemoglobin 12.0 - 15.0 g/dL 10.1(L) 10.8(L) 9.7(L)  Hematocrit 36 - 46 % 31.6(L) 33.4(L) 29.1(L)  Platelets 150 - 400 K/uL 342 375 256      CMP     Component Value Date/Time   NA 140 09/13/2020 0539   K 4.1 09/13/2020 0539   CL 104 09/13/2020 0539   CO2 27 09/13/2020 0539   GLUCOSE 152 (H) 09/13/2020 0539   BUN 18 09/13/2020 0539   CREATININE 1.03 (H) 09/13/2020 0539   CALCIUM 8.3 (L) 09/13/2020 0539   PROT 6.4 (L) 09/12/2020 1653   ALBUMIN 2.7 (L) 09/12/2020 1653   AST 12 (L) 09/12/2020 1653   ALT 11 09/12/2020 1653   ALKPHOS 59 09/12/2020 1653   BILITOT 0.4 09/12/2020 1653   GFRNONAA 55 (L) 09/13/2020 0539   GFRAA >60 09/13/2020 0539        Assessment & Plan:   1. Pressure injury of deep tissue of left calf The wound is continuing to show slow signs of healing.  Previous noninvasive studies show evidence of marginal healing ability.  We will attempt to have the patient see the wound care center for possible treatment options.  I suspect the patient may need more intensive treatment and topical wound healing agents such as sharp debridement.  While, this was not discussed with the patient at this visit, because this patient does not have mobility in his lower extremity amputation may be a viable option as well. - Ambulatory referral to Wound Clinic  2. Primary hypertension Continue antihypertensive medications as already ordered, these medications have been reviewed and there are no changes at this  time.   3. Type 2 diabetes mellitus with foot ulcer, with long-term current use of insulin [place]) Patient previously referred to podiatry.  Patient has not yet seen podiatry urged to follow-up in regards to heel ulceration.   Current Outpatient Medications on  File Prior to Visit  Medication Sig Dispense Refill  . aspirin EC 81 MG EC tablet Take 1 tablet (81 mg total) by mouth daily. Swallow whole. 30 tablet 11  . atorvastatin (LIPITOR) 80 MG tablet Take 1 tablet (80 mg total) by mouth daily at 6 PM. 30 tablet 0  . cloNIDine (CATAPRES - DOSED IN MG/24 HR) 0.2 mg/24hr patch Place 1 patch (0.2 mg total) onto the skin once a week. 4 patch 0  . collagenase (SANTYL) ointment Apply topically daily. 15 g 0  . furosemide (LASIX) 20 MG tablet Take 1 tablet (20 mg total) by mouth daily. 30 tablet 11  . hydrALAZINE (APRESOLINE) 25 MG tablet Take 1 tablet (25 mg total) by mouth every 8 (eight) hours. 90 tablet 0  . insulin NPH-regular Human (NOVOLIN 70/30) (70-30) 100 UNIT/ML injection Inject 10 Units into the skin 2 (two) times daily with a meal. 10 mL 1  . metroNIDAZOLE (FLAGYL) 500 MG tablet     . ciprofloxacin (CIPRO) 750 MG tablet Take 1 tablet (750 mg total) by mouth 2 (two) times daily. (Patient not taking: Reported on 10/25/2020) 28 tablet 0  . labetalol (NORMODYNE) 100 MG tablet Take 1 tablet (100 mg total) by mouth 2 (two) times daily. 60 tablet 2  . lisinopril (ZESTRIL) 5 MG tablet Take 1 tablet (5 mg total) by mouth daily. 30 tablet 2   No current facility-administered medications on file prior to visit.    There are no Patient Instructions on file for this visit. No follow-ups on file.   Kris Hartmann, NP

## 2020-11-15 ENCOUNTER — Other Ambulatory Visit: Payer: Self-pay

## 2020-11-15 ENCOUNTER — Encounter: Payer: Medicare HMO | Attending: Internal Medicine | Admitting: Internal Medicine

## 2020-11-15 DIAGNOSIS — Z833 Family history of diabetes mellitus: Secondary | ICD-10-CM | POA: Insufficient documentation

## 2020-11-15 DIAGNOSIS — L97428 Non-pressure chronic ulcer of left heel and midfoot with other specified severity: Secondary | ICD-10-CM | POA: Insufficient documentation

## 2020-11-15 DIAGNOSIS — Z87891 Personal history of nicotine dependence: Secondary | ICD-10-CM | POA: Diagnosis not present

## 2020-11-15 DIAGNOSIS — E1152 Type 2 diabetes mellitus with diabetic peripheral angiopathy with gangrene: Secondary | ICD-10-CM | POA: Insufficient documentation

## 2020-11-15 DIAGNOSIS — I890 Lymphedema, not elsewhere classified: Secondary | ICD-10-CM | POA: Diagnosis not present

## 2020-11-15 DIAGNOSIS — E11621 Type 2 diabetes mellitus with foot ulcer: Secondary | ICD-10-CM | POA: Diagnosis present

## 2020-11-15 DIAGNOSIS — L97228 Non-pressure chronic ulcer of left calf with other specified severity: Secondary | ICD-10-CM | POA: Diagnosis not present

## 2020-11-15 NOTE — Progress Notes (Signed)
Brooke Hopkins ([id number]) Visit Report for 11/15/2020 Abuse/Suicide Risk Screen Details Patient Name: Brooke Hopkins, Brooke Hopkins. Date of Service: 11/15/2020 9:15 AM Medical Record Number: [id number] Patient Account Number: [id number] Date of Birth/Sex: 12-23-1951 (69 y.o. F) Treating RN: Dolan Amen Primary Care Isbella Arline: Lavera Guise Other Clinician: Referring Froylan Hobby: Lavera Guise Treating Lundyn Coste/Extender: Ricard Dillon Weeks in Treatment: 0 Abuse/Suicide Risk Screen Items Answer ABUSE RISK SCREEN: Has anyone close to you tried to hurt or harm you recentlyo No Do you feel uncomfortable with anyone in your familyo No Has anyone forced you do things that you didnot want to doo No Electronic Signature(s) Signed: 11/15/2020 11:12:32 AM By: Georges Mouse, Minus Breeding Entered By: Georges Mouse, Minus Breeding on 11/15/2020 09:20:39 Brooke Hopkins ([id number]) -------------------------------------------------------------------------------- Activities of Daily Living Details Patient Name: Brooke Hopkins. Date of Service: 11/15/2020 9:15 AM Medical Record Number: [id number] Patient Account Number: [id number] Date of Birth/Sex: [birth year] (69 y.o. F) Treating RN: Dolan Amen Primary Care Meila Berke: Lavera Guise Other Clinician: Referring Lynita Groseclose: Lavera Guise Treating Ashleah Valtierra/Extender: Tito Dine in Treatment: 0 Activities of Daily Living Items Answer Activities of Daily Living (Please select one for each item) Drive Automobile Need Assistance Take Medications Need Assistance Use Telephone Need Assistance Care for Appearance Need Assistance Use Toilet Need Assistance Bath / Shower Need Assistance Dress Self Need Assistance Feed Self Need Assistance Walk Need Assistance Get In / Out Bed Need Assistance Housework Need Assistance Prepare Meals Need Assistance Handle Money Need Assistance Shop for Self Need Assistance Electronic Signature(s) Signed:  11/15/2020 11:12:32 AM By: Georges Mouse, Minus Breeding Entered By: Georges Mouse, Minus Breeding on 11/15/2020 09:21:00 Brooke Hopkins ([id number]) -------------------------------------------------------------------------------- Education Screening Details Patient Name: Brooke Hopkins. Date of Service: 11/15/2020 9:15 AM Medical Record Number: [id number] Patient Account Number: [id number] Date of Birth/Sex: 31-Mar-1951 (69 y.o. F) Treating RN: Dolan Amen Primary Care Dallin Mccorkel: Lavera Guise Other Clinician: Referring Sulamita Lafountain: Lavera Guise Treating Mackensi Mahadeo/Extender: Tito Dine in Treatment: 0 Primary Learner Assessed: Patient Learning Preferences/Education Level/Primary Language Highest Education Level: College or Above Preferred Language: English Cognitive Barrier Language Barrier: No Translator Needed: No Memory Deficit: No Emotional Barrier: No Cultural/Religious Beliefs Affecting Medical Care: No Physical Barrier Impaired Vision: Yes CATARACTS Impaired Hearing: No Decreased Hand dexterity: Yes Limitations: STROKE Knowledge/Comprehension Knowledge Level: High Comprehension Level: High Ability to understand written instructions: High Ability to understand verbal instructions: High Motivation Anxiety Level: Calm Cooperation: Cooperative Education Importance: Acknowledges Need Interest in Health Problems: Asks Questions Perception: Coherent Willingness to Engage in Self-Management High Activities: Readiness to Engage in Self-Management High Activities: Electronic Signature(s) Signed: 11/15/2020 11:12:32 AM By: Georges Mouse, Minus Breeding Entered By: Georges Mouse, Minus Breeding on 11/15/2020 09:23:51 Brooke Hopkins ([id number]) -------------------------------------------------------------------------------- Fall Risk Assessment Details Patient Name: Brooke Hopkins. Date of Service: 11/15/2020 9:15 AM Medical Record Number: [id number] Patient Account  Number: [id number] Date of Birth/Sex: 01-07-1951 (69 y.o. F) Treating RN: Dolan Amen Primary Care Flor Whitacre: Lavera Guise Other Clinician: Referring Ola Raap: Lavera Guise Treating Arlyn Buerkle/Extender: Tito Dine in Treatment: 0 Fall Risk Assessment Items Have you had 2 or more falls in the last 12 monthso 0 No Have you had any fall that resulted in injury in the last 12 monthso 0 Yes FALLS RISK SCREEN History of falling - immediate or within 3 months 0 No Secondary diagnosis (Do you have 2 or more medical diagnoseso) 15 Yes Ambulatory aid None/bed rest/wheelchair/nurse 0 Yes Crutches/cane/walker 0 No Furniture 0 No Intravenous therapy Access/Saline/Heparin Lock 0 No Gait/Transferring Normal/ bed rest/  wheelchair 0 Yes Weak (short steps with or without shuffle, stooped but able to lift head while walking, may 0 No seek support from furniture) Impaired (short steps with shuffle, may have difficulty arising from chair, head down, impaired 0 No balance) Mental Status Oriented to own ability 0 Yes Electronic Signature(s) Signed: 11/15/2020 11:12:32 AM By: Georges Mouse, Minus Breeding Entered By: Georges Mouse, Minus Breeding on 11/15/2020 09:25:47 Brooke Hopkins ([id number]) -------------------------------------------------------------------------------- Foot Assessment Details Patient Name: Brooke Hopkins. Date of Service: 11/15/2020 9:15 AM Medical Record Number: [id number] Patient Account Number: [id number] Date of Birth/Sex: 03-12-1951 (69 y.o. F) Treating RN: Dolan Amen Primary Care Jaydn Moscato: Lavera Guise Other Clinician: Referring Vernica Wachtel: Lavera Guise Treating Aliegha Paullin/Extender: Tito Dine in Treatment: 0 Foot Assessment Items Site Locations + = Sensation present, - = Sensation absent, C = Callus, U = Ulcer R = Redness, W = Warmth, M = Maceration, PU = Pre-ulcerative lesion F = Fissure, S = Swelling, D = Dryness Assessment Right: Left: Other  Deformity: No No Prior Foot Ulcer: No No Prior Amputation: No No Charcot Joint: No No Ambulatory Status: Non-ambulatory Assistance Device: Wheelchair Gait: Electronic Signature(s) Signed: 11/15/2020 11:12:32 AM By: Georges Mouse, Minus Breeding Entered By: Georges Mouse, Minus Breeding on 11/15/2020 09:26:53 Brooke Hopkins ([id number]) -------------------------------------------------------------------------------- Nutrition Risk Screening Details Patient Name: AIRYANA, SPRUNGER. Date of Service: 11/15/2020 9:15 AM Medical Record Number: [id number] Patient Account Number: [id number] Date of Birth/Sex: 1951-04-26 (69 y.o. F) Treating RN: Dolan Amen Primary Care Jedadiah Abdallah: Lavera Guise Other Clinician: Referring Amjad Fikes: Lavera Guise Treating Euna Armon/Extender: Ricard Dillon Weeks in Treatment: 0 Height (in): 67 Weight (lbs): 300 Body Mass Index (BMI): 47 Nutrition Risk Screening Items Score Screening NUTRITION RISK SCREEN: I have an illness or condition that made me change the kind and/or amount of food I eat 0 No I eat fewer than two meals per day 0 No I eat few fruits and vegetables, or milk products 0 No I have three or more drinks of beer, liquor or wine almost every day 0 No I have tooth or mouth problems that make it hard for me to eat 0 No I don't always have enough money to buy the food I need 0 No I eat alone most of the time 0 No I take three or more different prescribed or over-the-counter drugs a day 0 No Without wanting to, I have lost or gained 10 pounds in the last six months 0 No I am not always physically able to shop, cook and/or feed myself 0 No Nutrition Protocols Good Risk Protocol 0 No interventions needed Moderate Risk Protocol High Risk Proctocol Risk Level: Good Risk Score: 0 Electronic Signature(s) Signed: 11/15/2020 11:12:32 AM By: Georges Mouse, Minus Breeding Entered By: Georges Mouse, Minus Breeding on 11/15/2020 82:70:78

## 2020-11-16 ENCOUNTER — Telehealth (INDEPENDENT_AMBULATORY_CARE_PROVIDER_SITE_OTHER): Payer: Self-pay

## 2020-11-16 NOTE — Telephone Encounter (Signed)
Unfortunately because we did not do her debridement we can't provide pain medication.  I would suggest contacting wound care again to see if they would be able to possibly grant a one time exception.  Alternatively, you can reach out to your PCP to see if they may be able to provide medication.

## 2020-11-16 NOTE — Telephone Encounter (Signed)
Because it was debrided at [facility] wound care, they would need to contact that office for pain management

## 2020-11-16 NOTE — Telephone Encounter (Signed)
Patient was made aware with medical advice and verbalized understanding 

## 2020-11-16 NOTE — Telephone Encounter (Signed)
Patient stated that they have contacted pain management and was told they did not prescribe pain medication for the debridement

## 2020-11-21 NOTE — Progress Notes (Addendum)
Brooke Hopkins, Brooke Hopkins ([id number]) Visit Report for 11/15/2020 Allergy List Details Patient Name: Brooke Hopkins, Brooke Hopkins. Date of Service: 11/15/2020 9:15 AM Medical Record Number: [id number] Patient Account Number: [id number] Date of Birth/Sex: 17-Nov-1951 (69 y.o. Female) Treating RN: Dolan Amen Primary Care Brooke Hopkins: Lavera Guise Other Clinician: Referring Brooke Hopkins: Lavera Guise Treating Kayzlee Wirtanen/Extender: Ricard Dillon Weeks in Treatment: 0 Allergies Active Allergies AQUACELL AG DRESSING Reaction: BURN Allergy Notes Electronic Signature(s) Signed: 11/15/2020 11:12:32 AM By: Georges Mouse, Minus Breeding Entered By: Georges Mouse, Minus Breeding on 11/15/2020 09:15:38 Brooke Blinks ([id number]) -------------------------------------------------------------------------------- Arrival Information Details Patient Name: Brooke Hopkins, Brooke Hopkins. Date of Service: 11/15/2020 9:15 AM Medical Record Number: [id number] Patient Account Number: [id number] Date of Birth/Sex: 01/11/1951 (69 y.o. Female) Treating RN: Dolan Amen Primary Care Rosalia Mcavoy: Lavera Guise Other Clinician: Referring Brooke Hopkins: Lavera Guise Treating Brooke Hopkins/Extender: Tito Dine in Treatment: 0 Visit Information Patient Arrived: Wheel Chair Arrival Time: 08:59 Accompanied By: husband Transfer Assistance: Civil Service fast streamer Patient Identification Verified: Yes Secondary Verification Process Completed: Yes Patient Has Alerts: Yes Patient Alerts: Type II Diabetic History Since Last Visit Electronic Signature(s) Signed: 11/15/2020 10:47:56 AM By: Gretta Cool, BSN, RN, CWS, Kim RN, BSN Entered By: Gretta Cool, BSN, RN, CWS, Kim on 11/15/2020 10:47:55 Brooke Blinks ([id number]) -------------------------------------------------------------------------------- Clinic Level of Care Assessment Details Patient Name: Brooke Hopkins, Brooke Hopkins. Date of Service: 11/15/2020 9:15 AM Medical Record Number: [id number] Patient Account Number:  [id number] Date of Birth/Sex: Aug 07, 1951 (69 y.o. Female) Treating RN: Brooke Hopkins Primary Care Tearia Gibbs: Lavera Guise Other Clinician: Referring Chalyn Amescua: Lavera Guise Treating Nargis Abrams/Extender: Tito Dine in Treatment: 0 Clinic Level of Care Assessment Items TOOL 1 Quantity Score []  - Use when EandM and Procedure is performed on INITIAL visit 0 ASSESSMENTS - Nursing Assessment / Reassessment X - General Physical Exam (combine w/ comprehensive assessment (listed just below) when performed on new 1 20 pt. evals) X- 1 25 Comprehensive Assessment (HX, ROS, Risk Assessments, Wounds Hx, etc.) ASSESSMENTS - Wound and Skin Assessment / Reassessment []  - Dermatologic / Skin Assessment (not related to wound area) 0 ASSESSMENTS - Ostomy and/or Continence Assessment and Care []  - Incontinence Assessment and Management 0 []  - 0 Ostomy Care Assessment and Management (repouching, etc.) PROCESS - Coordination of Care []  - Simple Patient / Family Education for ongoing care 0 X- 1 20 Complex (extensive) Patient / Family Education for ongoing care X- 1 10 Staff obtains Consents, Records, Test Results / Process Orders []  - 0 Staff telephones HHA, Nursing Homes / Clarify orders / etc []  - 0 Routine Transfer to another Facility (non-emergent condition) []  - 0 Routine Hospital Admission (non-emergent condition) X- 1 15 New Admissions / Biomedical engineer / Ordering NPWT, Apligraf, etc. []  - 0 Emergency Hospital Admission (emergent condition) PROCESS - Special Needs []  - Pediatric / Minor Patient Management 0 []  - 0 Isolation Patient Management []  - 0 Hearing / Language / Visual special needs []  - 0 Assessment of Community assistance (transportation, D/C planning, etc.) []  - 0 Additional assistance / Altered mentation []  - 0 Support Surface(s) Assessment (bed, cushion, seat, etc.) INTERVENTIONS - Miscellaneous []  - External ear exam 0 X- 1 10 Patient Transfer (multiple  staff / Civil Service fast streamer / Similar devices) []  - 0 Simple Staple / Suture removal (25 or less) []  - 0 Complex Staple / Suture removal (26 or more) []  - 0 Hypo/Hyperglycemic Management (do not check if billed separately) []  - 0 Ankle / Brachial Index (ABI) - do not check if billed separately Has the patient  been seen at the hospital within the last three years: Yes Total Score: 100 Level Of Care: New/Established - [address] Brooke Hopkins, Brooke Hopkins ([id number]) Electronic Signature(s) Signed: 11/16/2020 5:12:38 PM By: Gretta Cool, BSN, RN, CWS, Kim RN, BSN Entered By: Gretta Cool, BSN, RN, CWS, Kim on 11/15/2020 10:15:54 Brooke Blinks ([id number]) -------------------------------------------------------------------------------- Encounter Discharge Information Details Patient Name: Brooke Hopkins. Date of Service: 11/15/2020 9:15 AM Medical Record Number: [id number] Patient Account Number: [id number] Date of Birth/Sex: 29-Sep-1951 (69 y.o. Female) Treating RN: Brooke Hopkins Primary Care Cleofas Hudgins: Lavera Guise Other Clinician: Referring Wesleigh Markovic: Lavera Guise Treating Aarin Sparkman/Extender: Tito Dine in Treatment: 0 Encounter Discharge Information Items Post Procedure Vitals Discharge Condition: Stable Unable to obtain vitals Reason: Patient preference Ambulatory Status: Wheelchair Discharge Destination: Home Transportation: Private Auto Accompanied By: husband Schedule Follow-up Appointment: Yes Clinical Summary of Care: Electronic Signature(s) Signed: 11/15/2020 11:34:19 AM By: Gretta Cool, BSN, RN, CWS, Kim RN, BSN Entered By: Gretta Cool, BSN, RN, CWS, Kim on 11/15/2020 11:34:18 Brooke Blinks ([id number]) -------------------------------------------------------------------------------- Lower Extremity Assessment Details Patient Name: Brooke Hopkins, Brooke Hopkins. Date of Service: 11/15/2020 9:15 AM Medical Record Number: [id number] Patient Account Number: [id number] Date of Birth/Sex: 1951/01/26 (69  y.o. Female) Treating RN: Dolan Amen Primary Care Treasure Ochs: Lavera Guise Other Clinician: Referring Trust Leh: Lavera Guise Treating Danyel Tobey/Extender: Ricard Dillon Weeks in Treatment: 0 Edema Assessment Assessed: [Left: Yes] [Right: No] Edema: [Left: Ye] [Right: s] Calf Left: Right: Point of Measurement: 31 cm From Medial Instep 41.2 cm Ankle Left: Right: Point of Measurement: 10 cm From Medial Instep 28.5 cm Vascular Assessment Pulses: Dorsalis Pedis Palpable: [Left:No] Doppler Audible: [Left:Yes] Posterior Tibial Palpable: [Left:No Yes] Notes 09/14/2020 Studies at [facility]. Patient non-compressible. Electronic Signature(s) Signed: 11/15/2020 11:35:54 AM By: Gretta Cool, BSN, RN, CWS, Kim RN, BSN Signed: 11/15/2020 3:22:47 PM By: Georges Mouse, Minus Breeding Previous Signature: 11/15/2020 11:12:32 AM Version By: Georges Mouse, Ron Agee By: Gretta Cool BSN, RN, CWS, Kim on 11/15/2020 11:35:53 MAYSON, STERBENZ ([id number]) -------------------------------------------------------------------------------- Multi Wound Chart Details Patient Name: Brooke Hopkins, Brooke Hopkins. Date of Service: 11/15/2020 9:15 AM Medical Record Number: [id number] Patient Account Number: [id number] Date of Birth/Sex: [birth year] (69 y.o. Female) Treating RN: Brooke Hopkins Primary Care Nonnie Pickney: Lavera Guise Other Clinician: Referring Patty Leitzke: Lavera Guise Treating Chrissi Crow/Extender: Tito Dine in Treatment: 0 Vital Signs Height(in): 67 Pulse(bpm): Weight(lbs): 270 Blood Pressure(mmHg): 99/58 Body Mass Index(BMI): 42 Temperature(F): 98 Respiratory Rate(breaths/min): 22 Photos: [7:No Photos] [8:No Photos] [N/A:N/A] Wound Location: [7:Left, Posterior Lower Leg] [8:Left Calcaneus] [N/A:N/A] Wounding Event: [7:Pressure Injury] [8:Pressure Injury] [N/A:N/A] Primary Etiology: [7:Pressure Ulcer] [8:Pressure Ulcer] [N/A:N/A] Date Acquired: [7:08/30/2020] [8:09/04/2020] [N/A:N/A] Weeks of Treatment: [7:0]  [8:0] [N/A:N/A] Wound Status: [7:Open] [8:Open] [N/A:N/A] Measurements L x W x D (cm) [7:13.4x6x1.1] [8:6x2.8x0.4] [N/A:N/A] Area (cm) : [7:63.146] [8:13.195] [N/A:N/A] Volume (cm) : [7:69.461] [8:5.278] [N/A:N/A] % Reduction in Area: [7:0.00%] [8:N/A] [N/A:N/A] % Reduction in Volume: [7:0.00%] [8:N/A] [N/A:N/A] Classification: [7:Category/Stage III] [8:Unstageable/Unclassified] [N/A:N/A] Exudate Amount: [7:Large] [8:Small] [N/A:N/A] Exudate Type: [7:Serosanguineous] [8:Serous] [N/A:N/A] Exudate Color: [7:red, brown] [8:amber] [N/A:N/A] Wound Margin: [7:N/A] [8:Flat and Intact] [N/A:N/A] Granulation Amount: [7:Small (1-33%)] [8:None Present (0%)] [N/A:N/A] Granulation Quality: [7:Red] [8:N/A] [N/A:N/A] Necrotic Amount: [7:Large (67-100%)] [8:Large (67-100%)] [N/A:N/A] Necrotic Tissue: [7:Eschar, Adherent Slough] [8:Eschar] [N/A:N/A] Exposed Structures: [7:Fat Layer (Subcutaneous Tissue): Yes Fascia: No Tendon: No Muscle: No Joint: No Bone: No] [8:Fascia: No Fat Layer (Subcutaneous Tissue): No Tendon: No Muscle: No Joint: No Bone: No] [N/A:N/A] Epithelialization: [7:Small (1-33%)] [8:None] [N/A:N/A] Debridement: [7:Debridement - Excisional] [8:N/A] [N/A:N/A] Pre-procedure Verification/Time 10:11 [8:N/A] [N/A:N/A] Out Taken: Tissue Debrided: [7:Necrotic/Eschar,  Fat, Tendon, Subcutaneous, Slough] [8:N/A] [N/A:N/A] Level: [7:Skin/Subcutaneous Tissue/Muscle] [8:N/A] [N/A:N/A] Debridement Area (sq cm): [7:80.4] [8:N/A] [N/A:N/A] Instrument: [7:Blade, Forceps] [8:N/A] [N/A:N/A] Bleeding: [7:Moderate] [8:N/A] [N/A:N/A] Hemostasis Achieved: [7:Pressure] [8:N/A] [N/A:N/A] Debridement Treatment [7:Procedure was tolerated well] [8:N/A] [N/A:N/A] Response: Post Debridement [7:13.6x1.5x1.1] [8:N/A] [N/A:N/A] Measurements L x W x D (cm) Post Debridement Volume: [7:17.624] [8:N/A] [N/A:N/A] (cm) Procedures Performed: [7:Debridement] [8:N/A] [N/A:N/A] Treatment Notes Wound #7 (Left, Lateral,  Posterior Lower Leg) [name], PASCH ([id number]) Notes santyl, abd, gauze Wound #8 (Left Calcaneus) Notes santyl, abd, gauze Electronic Signature(s) Signed: 11/15/2020 11:33:41 AM By: Gretta Cool, BSN, RN, CWS, Kim RN, BSN Entered By: Gretta Cool, BSN, RN, CWS, Kim on 11/15/2020 11:33:41 Brooke Blinks ([id number]) -------------------------------------------------------------------------------- [place] Details Patient Name: Brooke Hopkins, Brooke Hopkins. Date of Service: 11/15/2020 9:15 AM Medical Record Number: [id number] Patient Account Number: [id number] Date of Birth/Sex: March 18, 1951 (69 y.o. Female) Treating RN: Brooke Hopkins Primary Care Daichi Moris: Lavera Guise Other Clinician: Referring Artis Buechele: Lavera Guise Treating Doniqua Saxby/Extender: Tito Dine in Treatment: 0 Active Inactive Electronic Signature(s) Signed: 12/08/2020 3:01:44 PM By: Gretta Cool, BSN, RN, CWS, Kim RN, BSN Previous Signature: 11/15/2020 11:30:21 AM Version By: Gretta Cool, BSN, RN, CWS, Kim RN, BSN Entered By: Gretta Cool, BSN, RN, CWS, Kim on 12/08/2020 15:01:43 Brooke Blinks ([id number]) -------------------------------------------------------------------------------- Pain Assessment Details Patient Name: Brooke Hopkins, Brooke Hopkins. Date of Service: 11/15/2020 9:15 AM Medical Record Number: [id number] Patient Account Number: [id number] Date of Birth/Sex: 1951/04/04 (69 y.o. Female) Treating RN: Dolan Amen Primary Care Jag Lenz: Lavera Guise Other Clinician: Referring Mayme Profeta: Lavera Guise Treating Romonia Yanik/Extender: Tito Dine in Treatment: 0 Active Problems Location of Pain Severity and Description of Pain Patient Has Paino Yes Site Locations Pain Location: Generalized Pain Rate the pain. Current Pain Level: 5 Pain Management and Medication Current Pain Management: Electronic Signature(s) Signed: 11/15/2020 11:12:32 AM By: Georges Mouse, Minus Breeding Entered By: Georges Mouse, Minus Breeding on  11/15/2020 09:14:44 Brooke Blinks ([id number]) -------------------------------------------------------------------------------- Patient/Caregiver Education Details Patient Name: Brooke Hopkins, Brooke Hopkins. Date of Service: 11/15/2020 9:15 AM Medical Record Number: [id number] Patient Account Number: [id number] Date of Birth/Gender: [birth year] (69 y.o. Female) Treating RN: Brooke Hopkins Primary Care Physician: Lavera Guise Other Clinician: Referring Physician: Lavera Guise Treating Physician/Extender: Tito Dine in Treatment: 0 Education Assessment Education Provided To: Patient Education Topics Provided Wound/Skin Impairment: Handouts: Caring for Your Ulcer, Other: wound care as prescribed Methods: Demonstration, Explain/Verbal Responses: State content correctly Electronic Signature(s) Signed: 11/16/2020 5:12:38 PM By: Gretta Cool, BSN, RN, CWS, Kim RN, BSN Entered By: Gretta Cool, BSN, RN, CWS, Kim on 11/15/2020 10:16:25 Brooke Blinks ([id number]) -------------------------------------------------------------------------------- Wound Assessment Details Patient Name: Brooke Hopkins, Brooke Hopkins. Date of Service: 11/15/2020 9:15 AM Medical Record Number: [id number] Patient Account Number: [id number] Date of Birth/Sex: 09/27/1951 (69 y.o. 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, Lateral, Posterior Lower Leg Wound Status: Amputation Wounding Event: Pressure Injury Outcome Level: Above Knee Date Acquired: 08/30/2020 Weeks Of Treatment: 0 Clustered Wound: No Photos Photo Uploaded By: Georges Mouse, Minus Breeding on 11/15/2020 11:43:21 Wound Measurements Length: (cm) 13.4 Width: (cm) 6 Depth: (cm) 1.1 Area: (cm) 63.146 Volume: (cm) 69.461 % Reduction  in Area: 0% % Reduction in Volume: 0% Epithelialization: Small (1-33%) Tunneling: No Undermining: No Wound Description Classification: Category/Stage III Exudate Amount: Large Exudate Type: Serosanguineous Exudate Color: red, brown Foul Odor After Cleansing: No Slough/Fibrino Yes Wound Bed  Granulation Amount: Small (1-33%) Exposed Structure Granulation Quality: Red Fascia Exposed: No Necrotic Amount: Large (67-100%) Fat Layer (Subcutaneous Tissue) Exposed: Yes Necrotic Quality: Eschar, Adherent Slough Tendon Exposed: No Muscle Exposed: No Joint Exposed: No Bone Exposed: No Treatment Notes Wound #7 (Left, Lateral, Posterior Lower Leg) Notes santyl, abd, gauze Electronic Signature(s) Signed: 12/05/2020 5:21:12 PM By: Gretta Cool, BSN, RN, CWS, Kim RN, BSN Blagg, Zoi M. ([id number]) Previous Signature: 11/15/2020 11:12:32 AM Version By: Georges Mouse, Kenia Entered By: Gretta Cool, BSN, RN, CWS, Kim on 12/04/2020 17:37:41 Brooke Blinks ([id number]) -------------------------------------------------------------------------------- Wound Assessment Details Patient Name: GRACLYNN, VANANTWERP. Date of Service: 11/15/2020 9:15 AM Medical Record Number: [id number] Patient Account Number: [id number] Date of Birth/Sex: [birth year] (69 y.o. Female) Treating RN: Brooke Hopkins Primary Care Tye Juarez: Lavera Guise Other Clinician: Referring Irven Ingalsbe: Lavera Guise Treating Lenee Franze/Extender: Ricard Dillon Weeks in Treatment: 0 Wound Status Wound Number: 8 Primary Etiology: Pressure Ulcer Wound Location: Left Calcaneus Wound Status: Open Wounding Event: Pressure Injury Date Acquired: 09/04/2020 Weeks Of Treatment: 0 Clustered Wound: No Photos Photo Uploaded By: Gretta Cool, BSN, RN, CWS, Kim on 11/15/2020 11:39:51 Wound Measurements Length: (cm) 6 Width: (cm) 2.8 Depth: (cm) 0.4 Area: (cm) 13.195 Volume: (cm) 5.278 % Reduction in Area: % Reduction in Volume: Epithelialization:  None Tunneling: No Undermining: No Wound Description Classification: Unstageable/Unclassified Wound Margin: Flat and Intact Exudate Amount: Small Exudate Type: Serous Exudate Color: amber Foul Odor After Cleansing: No Slough/Fibrino No Wound Bed Granulation Amount: None Present (0%) Exposed Structure Necrotic Amount: Large (67-100%) Fascia Exposed: No Necrotic Quality: Eschar Fat Layer (Subcutaneous Tissue) Exposed: No Tendon Exposed: No Muscle Exposed: No Joint Exposed: No Bone Exposed: No Electronic Signature(s) Signed: 11/16/2020 5:12:38 PM By: Gretta Cool, BSN, RN, CWS, Kim RN, BSN Entered By: Gretta Cool, BSN, RN, CWS, Kim on 11/15/2020 10:02:58 Brooke Blinks ([id number]) -------------------------------------------------------------------------------- [place] Details Patient Name: Brooke Blinks. Date of Service: 11/15/2020 9:15 AM Medical Record Number: [id number] Patient Account Number: [id number] Date of Birth/Sex: [birth year] (69 y.o. Female) Treating RN: Dolan Amen Primary Care Nirvana Blanchett: Lavera Guise Other Clinician: Referring Kimanh Templeman: Lavera Guise Treating Prakriti Carignan/Extender: Tito Dine in Treatment: 0 Vital Signs Time Taken: 09:00 Temperature (F): 98 Height (in): 67 Respiratory Rate (breaths/min): 22 Weight (lbs): 270 Blood Pressure (mmHg): 99/58 Body Mass Index (BMI): 42.3 Reference Range: 80 - 120 mg / dl Electronic Signature(s) Signed: 11/15/2020 11:12:32 AM By: Georges Mouse, Minus Breeding Entered By: Georges Mouse, Minus Breeding on 11/15/2020 09:33:37

## 2020-11-21 NOTE — Progress Notes (Signed)
RAKESHA, DALPORTO ([id number]) Visit Report for 11/15/2020 Chief Complaint Document Details Patient Name: Brooke Hopkins, Brooke Hopkins. Date of Service: 11/15/2020 9:15 AM Medical Record Number: [id number] Patient Account Number: [id number] Date of Birth/Sex: Jun 25, 1951 (69 y.o. Female) Treating RN: [name] Barman Primary Care Provider: Lavera Guise Other Clinician: Referring Provider: Lavera Guise Treating Provider/Extender: Tito Dine in Treatment: 0 Information Obtained from: Patient Chief Complaint Mrs. Moret returns for follow-up to her bilateral lower extremity ulcers 11/15/2020; patient returns to clinic for review of wounds on the left posterior calf and posterior left Electronic Signature(s) Signed: 11/21/2020 6:44:03 AM By: Linton Ham MD Entered By: Linton Ham on 11/15/2020 10:25:06 [name] ([id number]) -------------------------------------------------------------------------------- Debridement Details Patient Name: Brooke Hopkins, Brooke Hopkins. Date of Service: 11/15/2020 9:15 AM Medical Record Number: [id number] Patient Account Number: [id number] Date of Birth/Sex: 1951/04/05 (69 y.o. Female) Treating RN: [name] Barman Primary Care Provider: Lavera Guise Other Clinician: Referring Provider: Lavera Guise Treating Provider/Extender: Tito Dine in Treatment: 0 Debridement Performed for Wound #7 Left,Lateral,Posterior Lower Leg Assessment: Performed By: Physician Ricard Dillon, MD Debridement Type: Debridement Severity of Tissue Pre Debridement: Other severity specified Level of Consciousness (Pre- Awake and Alert procedure): Pre-procedure Verification/Time Out Yes - 10:11 Taken: Total Area Debrided (L x W): 13.4 (cm) x 6 (cm) = 80.4 (cm) Tissue and other material Viable, Non-Viable, Eschar, Fat, Slough, Subcutaneous, Tendon, Slough debrided: Level: Skin/Subcutaneous Tissue/Muscle Debridement Description: Excisional Instrument: Blade,  Forceps Bleeding: Moderate Hemostasis Achieved: Pressure Response to Treatment: Procedure was tolerated well Level of Consciousness (Post- Awake and Alert procedure): Post Debridement Measurements of Total Wound Length: (cm) 13.6 Width: (cm) 1.5 Depth: (cm) 1.1 Volume: (cm) 17.624 Character of Wound/Ulcer Post Debridement: Stable Severity of Tissue Post Debridement: Other severity specified Post Procedure Diagnosis Same as Pre-procedure Electronic Signature(s) Signed: 11/16/2020 5:12:38 PM By: Gretta Cool, BSN, RN, CWS, Kim RN, BSN Signed: 11/21/2020 6:44:03 AM By: Linton Ham MD Entered By: Linton Ham on 11/15/2020 10:24:41 [name] ([id number]) -------------------------------------------------------------------------------- HPI Details Patient Name: Brooke Hopkins, Brooke Hopkins. Date of Service: 11/15/2020 9:15 AM Medical Record Number: [id number] Patient Account Number: [id number] Date of Birth/Sex: [birth year] (69 y.o. Female) Treating RN: [name] Barman Primary Care Provider: Lavera Guise Other Clinician: Referring Provider: Lavera Guise Treating Provider/Extender: Tito Dine in Treatment: 0 History of Present Illness Location: massive swelling of both lower extremities and ulceration both lower extremities Quality: Patient reports experiencing a dull pain to affected area(s). Severity: Patient states wound are getting worse. Duration: Patient has had the wound for >2 years prior to seeking treatment at the wound center Timing: Pain in wound is constant (hurts all the time) Context: The wound appeared gradually over time Modifying Factors: Other treatment(s) tried include:she has a lymphedema pump but uses it seldom and she's had several course of antibiotics Associated Signs and Symptoms: Patient reports having difficulty standing for long periods. HPI Description: 69 year old patient seen by Dr. Ola Spurr of infectious disease who has been following up for  left lower extremity cellulitis and ulcer with recurrent bilateral lower extremity problems for several months. Recently she had a large right lower extremity bullae which opened out and has been ulcerated. She has seen the vascular group and has been getting Unna's wraps and has a lymphedema pump used in the past. Her prior cultures were positive for Pseudomonas, Proteus and was treated with amoxicillin. He has also been treated with 2 weeks course of ciprofloxacin and amoxicillin.. Increase of Lasix dose helped with the edema and echo showed  no systolic CHF but may be diastolic problems. Past medical history significant for diabetes mellitus type 2, venous stasis ulcer, obesity, diabetic peripheral neuropathy, status post back surgery, cholecystectomy, hysterectomy, arthroscopy of the knee. He is a former smoker and quit smoking in 1984. The patient has seen Dr. Delana Meyer who did not recommend any arterial or venous duplex studies and has been using Unna's wraps and also recommended a lymphedema pump. she has been very noncompliant with using these. 06/28/2016 -- the patient is still on antibiotics as prescribed by Dr. Ola Spurr and he is asked her to take it for 3 weeks. The patient also says she has a lot of redness and pain in the folds of her thigh and lower extremity and this is very painful. 07/26/2016 -- the patient is off antibiotics and has been getting dressing changes 3 times a week. 08/09/2016 -- is been on Cipro and is taking potassium supplements along with her Lasix and is going to be seeing Dr. Ola Spurr for a consult return visit only in November 2017. 08/23/2016 -- she was admitted to the hospital last week and I have reviewed these reports in detail. She was seen by Dr. Ola Spurr who noted a Pseudomonas infection which was resistant to ciprofloxacin and discharged her on Ceftazidime 1 g IV every 12 hourly anyone days. The antibiotic was to be stopped on September 11 and he would  see her back in the clinic. 08/30/2016 -- had a communication from Dr. Ola Spurr that he would extend her antibiotics by a week if she continued to look like cellulitis was persisting. 09/13/2016 -- the PICC line is out and antibiotics have stopped. 10/04/16: returns today for f/u. reports utilizes compression pump twice daily. she is compliant with her compression therapy. she reports a new blister on the right proximal posterior calf. no systemic s/s of infection. 10/11/16: returns today for f/u. she reports adherence to her compression pumps, but there is no improvement regarding her BLE edema. there is weeping of fluid. denies fever, chills, body aches or malaise. 10/18/2016 -- she has a significant cellulitis of her right lower extremity which was not there last week. I believe at this stage she will need the expertise of Dr. Ola Spurr to decide on antibiotic coverage. 10/25/2016 -- the patient has had cipro and ampicillin been started by Dr. Ola Spurr and he is awaiting further cultures. Overall she's been feeling better. 11/08/2016 -- Dr. Ola Spurr this week who has continued with ciprofloxacin and amoxicillin and is awaiting further cultures before deciding to place her on a PICC line. 11/15/2016 -- Dr. Blane Ohara note was reviewed and her cultures growing Pseudomonas in both legs which is sensitive to Cipro and the other is resistant to Cipro and she would get a PICC line and start IV antibiotics -- Ceftazidime 1 g q 12 for 3 weeks. 11-29-16 Mrs. Breuer presents today in follow-up of her bilateral lower exterminators ulcers. She remains on IV antibiotic therapy via a PICC line per Dr. Ola Spurr; she has a follow-up appointment with him on Monday, 12/02/2016. She anticipates that the about therapy will be discontinued at that appointment. She denies any complications of nausea vomiting and/or diarrhea accompanied by this antibiotic therapy. She admits to using her lymphedema pumps  twice daily with the exception of yesterday. She denies any concerns or complications with the current treatment plan. 12/06/2016 -- Dr. Ola Spurr saw her recently on 12/02/2016 and due to the fact she has had significant problems with recurrent ulceration and Pseudomonas infection he recommended 3  more weeks of antibiotics and extended it until December 25. The PICC line will be in place. Last hemoglobin A1c on November 30 was 9% 12/27/2016 -- because of the holidays the patient's diet has been higher in salt, her dressings have not been done as required and she is not used to lymphedema pumps appropriately. This has led to her lymphedema increasing markedly. 01/24/2017 -- the patient's hospital bed is malfunctioning and hence her legs have been flat in bed and her lymphedema is increased markedly. During my examination I also noted a unstageable pressure injury to her right heel 02/07/2017 -- she has returned after 2 weeks as she was done with influenza and has been in bed a lot, with a new bed which has helped her reduce the edema due to proper elevation. Will she has a full-fledged large pressure injury to the right heel. 02/28/2017 -- her dressing was being taken down with scissors she had a sharp injury to the left medial calf area which is fairly superficial. COZETTA, SEIF ([id number]) READMISSION 11/15/2020 This is a now 69 year old woman who is a type II diabetic. She was here for a prolonged period in 2017 and 18 with bilateral lower extremity wounds complicated by cellulitis. Ultimately discharged with juxta lites and lymphedema pumps. She was felt to have chronic venous insufficiency and lymphedema. Her current problem started in August with a weeping area on the posterior left calf that rapidly expanded. She required admission to hospital from 08/29/2020 through 09/05/2020. She was seen by Dr. Delana Meyer. 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 On 9/3 she underwent an angioplasty and stenting of the left popliteal artery and an angioplasty of the left anterior tibial artery. 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 The posterior tibial artery is nonvisualized. X-ray of the left leg in the hospital was negative sedimentation rate 47 she was treated with IV Zyvox and discharged on p.o. clindamycin. She did also have a venous reflux study that showed no DVT or superficial thrombosis she did have left common femoral vein and left greater saphenous vein reflux at the thigh and knee. She was followed by vein and vascular I saw her at the end of October. They have been using Santyl. 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 She presented with altered LOC She is back at home. She is very immobile. She requires a [employer] fast streamer for transfer. She is being cared for by her husband she has [facility] home health. I think they are just applying ABD pads and Kerlix to both wound areas. Topical dressing probably Santyl Past medical history includes lymphedema, hypertension, type 2 diabetes, CVA, chronic kidney disease, post thrombotic syndrome, Noninvasive arterial studies were followed up on 9/29 ABIs were noncompressible bilaterally. TBI was 0.59 on the right and 0.53 on the left biphasic waveforms bilaterally. Electronic Signature(s) Signed: 11/21/2020 6:44:03 AM By: Linton Ham MD Entered By: Linton Ham on 11/15/2020 10:35:45 [name] ([id number]) -------------------------------------------------------------------------------- Physical Exam Details Patient Name: Brooke Hopkins, Brooke Hopkins. Date of Service: 11/15/2020 9:15 AM Medical Record Number: [id number] Patient Account Number: [id number] Date of Birth/Sex: 1951-06-02 (69 y.o. Female) Treating RN: [name] Barman Primary Care Provider: Lavera Guise Other  Clinician: Referring Provider: Lavera Guise Treating Provider/Extender: Ricard Dillon Weeks in Treatment: 0 Constitutional Patient is hypotensive. However appears stable.Marland Kitchen Heart rate irregular. Rate about 110.Marland Kitchen Respirations regular, non-labored  and within target range.. Temperature is normal and within the target range for the patient.. Very frail woman. No mobile. She is alert and answers questions.. Cardiovascular Somewhat tachycardic this may be A. fib I did not see this on her problem list. But I will need to look back further. Jugular venous pressure was not elevated. I could not feel femoral pulse or popliteal pulse. Not feel her dorsalis pedis or posterior tibial. Nonpitting edema, right greater than left suggestive of lymphedema that she was diagnosed with years ago. Psychiatric No evidence of depression, anxiety, or agitation. Calm, cooperative, and communicative. Appropriate interactions and affect.. Notes Wound exam; this patient is not easy to examine. She cannot roll over in the examining chair. Propping the leg up I was able to look at the wound on the posterior calf. This is mostly necrotic. I was able to remove black necrotic material as well as nonviable subcutaneous material with pickups and a #10 scalpel. Underneath this very deep structures I think exposed tendon distally although it is very difficult to get a look at this. oThe left heel tip black eschar over the surface. I did not attempt to debride this Electronic Signature(s) Signed: 11/21/2020 6:44:03 AM By: Linton Ham MD Entered By: Linton Ham on 11/15/2020 10:41:57 [name] ([id number]) -------------------------------------------------------------------------------- Physician Orders Details Patient Name: KATERYN, MARASIGAN. Date of Service: 11/15/2020 9:15 AM Medical Record Number: [id number] Patient Account Number: [id number] Date of Birth/Sex: [birth year] (69 y.o. Female) Treating RN:  [name] Barman Primary Care Provider: Lavera Guise Other Clinician: Referring Provider: Lavera Guise Treating Provider/Extender: Tito Dine in Treatment: 0 Verbal / Phone Orders: No Diagnosis Coding Wound Cleansing o Dial antibacterial soap, wash wounds, rinse and pat dry prior to dressing wounds Anesthetic (add to Medication List) o Topical Lidocaine 4% cream applied to wound bed prior to debridement (In Clinic Only). Primary Wound Dressing o Santyl Ointment - applied nickel thick, covered with saline moistened gauze Secondary Dressing o ABD and Kerlix/Conform Dressing Change Frequency o Change Dressing Monday, Wednesday, Friday Follow-up Appointments o Return Appointment in 2 weeks. Edema Control o Elevate legs to the level of the heart and pump ankles as often as possible o Compression Pump: Use compression pump on left lower extremity for 60 minutes, twice daily. Home Health Wound #7 [place] Visits - Jackquline [place] is currently seeing the patient. o Home Health Nurse may visit PRN to address patientos wound care needs. o FACE TO FACE ENCOUNTER: MEDICARE and MEDICAID PATIENTS: I certify that this patient is under my care and that I had a face-to-face encounter that meets the physician face-to-face encounter requirements with this patient on this date. The encounter with the patient was in whole or in part for the following MEDICAL CONDITION: (primary reason for [place]) MEDICAL NECESSITY: I certify, that based on my findings, NURSING services are a medically necessary home health service. HOME BOUND STATUS: I certify that my clinical findings support that this patient is homebound (i.e., Due to illness or injury, pt requires aid of supportive devices such as crutches, cane, wheelchairs, walkers, the use of special transportation or the assistance of another person to leave their place of residence.  There is a normal inability to leave the home and doing so requires considerable and taxing effort. Other absences are for medical reasons / religious services and are infrequent or of short duration when for other reasons). o If current dressing causes regression in wound condition, may D/C ordered  dressing product/s and apply Normal Saline Moist Dressing daily until next [place] / Other MD appointment. [place] of regression in wound condition at [phone]. o Please direct any NON-WOUND related issues/requests for orders to patient's Primary Care Physician Wound #8 Left [place] Visits - Jackquline Denmark is currently seeing the patient. o Home Health Nurse may visit PRN to address patientos wound care needs. o FACE TO FACE ENCOUNTER: MEDICARE and MEDICAID PATIENTS: I certify that this patient is under my care and that I had a face-to-face encounter that meets the physician face-to-face encounter requirements with this patient on this date. The encounter with the patient was in whole or in part for the following MEDICAL CONDITION: (primary reason for [place]) MEDICAL NECESSITY: I certify, that based on my findings, NURSING services are a medically necessary home health service. HOME BOUND STATUS: I certify that my clinical findings support that this patient is homebound (i.e., Due to illness or injury, pt requires aid of supportive devices such as crutches, cane, wheelchairs, walkers, the use of special transportation or the assistance of another person to leave their place of residence. There is a normal inability to leave the home and doing so requires considerable and taxing effort. Other absences are for medical reasons / religious services and are infrequent or of short duration when for other reasons). o If current dressing causes regression in wound condition, may D/C ordered dressing product/s and apply Normal  Saline Moist Dressing daily until next [place] / Other MD appointment. [place] of regression in wound condition at [phone]. o Please direct any NON-WOUND related issues/requests for orders to patient's Primary Care Physician Medications-please add to medication list. Wound #7 Left,Lateral,Posterior Lower Leg JOHNNI, WUNSCHEL ([id number]) o Santyl Enzymatic Ointment Wound #8 Left Calcaneus o Santyl Enzymatic Ointment Patient Medications Allergies: AQUACELL AG DRESSING Notifications Medication Indication Start End Santyl wounds left calf and 11/15/2020 left heel DOSE topical 250 unit/gram ointment - ointment topical to affected areas change daily Coloplast Paste 11/15/2020 DOSE miscellaneous paste - paste miscellaneous to affected area daily Electronic Signature(s) Signed: 11/16/2020 5:12:38 PM By: Gretta Cool, BSN, RN, CWS, Kim RN, BSN Signed: 11/21/2020 6:44:03 AM By: Linton Ham MD Previous Signature: 11/15/2020 3:09:04 PM Version By: Gretta Cool BSN, RN, CWS, Kim RN, BSN Previous Signature: 11/15/2020 10:17:23 AM Version By: Linton Ham MD Previous Signature: 11/15/2020 10:14:16 AM Version By: Linton Ham MD Entered By: Gretta Cool, BSN, RN, CWS, Kim on 11/15/2020 15:10:49 LILLY, GASSER ([id number]) -------------------------------------------------------------------------------- Problem List Details Patient Name: LEISHA, TRINKLE. Date of Service: 11/15/2020 9:15 AM Medical Record Number: [id number] Patient Account Number: [id number] Date of Birth/Sex: 1951-08-17 (69 y.o. Female) Treating RN: [name] Barman Primary Care Provider: Lavera Guise Other Clinician: Referring Provider: Lavera Guise Treating Provider/Extender: Ricard Dillon Weeks in Treatment: 0 Active Problems ICD-10 Encounter Code Description Active Date MDM Diagnosis E11.52 Type 2 diabetes mellitus with diabetic peripheral angiopathy with 11/15/2020 No  Yes gangrene L97.228 Non-pressure chronic ulcer of left calf with other specified severity 11/15/2020 No Yes L97.428 Non-pressure chronic ulcer of left heel and midfoot with other specified 11/15/2020 No Yes severity I89.0 Lymphedema, not elsewhere classified 11/15/2020 No Yes Inactive Problems Resolved Problems Electronic Signature(s) Signed: 11/21/2020 6:44:03 AM By: Linton Ham MD Entered By: Linton Ham on 11/15/2020 10:23:29 [name] ([id number]) -------------------------------------------------------------------------------- Progress Note Details Patient Name: KATHEE, TUMLIN. Date of Service: 11/15/2020 9:15 AM Medical Record Number: [id number] Patient Account Number: [id number] Date of  Birth/Sex: [birth year] (69 y.o. Female) Treating RN: [name] Barman Primary Care Provider: Lavera Guise Other Clinician: Referring Provider: Lavera Guise Treating Provider/Extender: Tito Dine in Treatment: 0 Subjective Chief Complaint Information obtained from Patient Mrs. Croy returns for follow-up to her bilateral lower extremity ulcers 11/15/2020; patient returns to clinic for review of wounds on the left posterior calf and posterior left History of Present Illness (HPI) The following HPI elements were documented for the patient's wound: Location: massive swelling of both lower extremities and ulceration both lower extremities Quality: Patient reports experiencing a dull pain to affected area(s). Severity: Patient states wound are getting worse. Duration: Patient has had the wound for >2 years prior to seeking treatment at the wound center Timing: Pain in wound is constant (hurts all the time) Context: The wound appeared gradually over time Modifying Factors: Other treatment(s) tried include:she has a lymphedema pump but uses it seldom and she's had several course of antibiotics Associated Signs and Symptoms: Patient reports having difficulty standing for  long periods. 69 year old patient seen by Dr. Ola Spurr of infectious disease who has been following up for left lower extremity cellulitis and ulcer with recurrent bilateral lower extremity problems for several months. Recently she had a large right lower extremity bullae which opened out and has been ulcerated. She has seen the vascular group and has been getting Unna's wraps and has a lymphedema pump used in the past. Her prior cultures were positive for Pseudomonas, Proteus and was treated with amoxicillin. He has also been treated with 2 weeks course of ciprofloxacin and amoxicillin.. Increase of Lasix dose helped with the edema and echo showed no systolic CHF but may be diastolic problems. Past medical history significant for diabetes mellitus type 2, venous stasis ulcer, obesity, diabetic peripheral neuropathy, status post back surgery, cholecystectomy, hysterectomy, arthroscopy of the knee. He is a former smoker and quit smoking in 1984. The patient has seen Dr. Delana Meyer who did not recommend any arterial or venous duplex studies and has been using Unna's wraps and also recommended a lymphedema pump. she has been very noncompliant with using these. 06/28/2016 -- the patient is still on antibiotics as prescribed by Dr. Ola Spurr and he is asked her to take it for 3 weeks. The patient also says she has a lot of redness and pain in the folds of her thigh and lower extremity and this is very painful. 07/26/2016 -- the patient is off antibiotics and has been getting dressing changes 3 times a week. 08/09/2016 -- is been on Cipro and is taking potassium supplements along with her Lasix and is going to be seeing Dr. Ola Spurr for a consult return visit only in November 2017. 08/23/2016 -- she was admitted to the hospital last week and I have reviewed these reports in detail. She was seen by Dr. Ola Spurr who noted a Pseudomonas infection which was resistant to ciprofloxacin and discharged her on  Ceftazidime 1 g IV every 12 hourly anyone days. The antibiotic was to be stopped on September 11 and he would see her back in the clinic. 08/30/2016 -- had a communication from Dr. Ola Spurr that he would extend her antibiotics by a week if she continued to look like cellulitis was persisting. 09/13/2016 -- the PICC line is out and antibiotics have stopped. 10/04/16: returns today for f/u. reports utilizes compression pump twice daily. she is compliant with her compression therapy. she reports a new blister on the right proximal posterior calf. no systemic s/s of infection. 10/11/16: returns today for  f/u. she reports adherence to her compression pumps, but there is no improvement regarding her BLE edema. there is weeping of fluid. denies fever, chills, body aches or malaise. 10/18/2016 -- she has a significant cellulitis of her right lower extremity which was not there last week. I believe at this stage she will need the expertise of Dr. Ola Spurr to decide on antibiotic coverage. 10/25/2016 -- the patient has had cipro and ampicillin been started by Dr. Ola Spurr and he is awaiting further cultures. Overall she's been feeling better. 11/08/2016 -- Dr. Ola Spurr this week who has continued with ciprofloxacin and amoxicillin and is awaiting further cultures before deciding to place her on a PICC line. 11/15/2016 -- Dr. Blane Ohara note was reviewed and her cultures growing Pseudomonas in both legs which is sensitive to Cipro and the other is resistant to Cipro and she would get a PICC line and start IV antibiotics -- Ceftazidime 1 g q 12 for 3 weeks. 11-29-16 Mrs. Ortlieb presents today in follow-up of her bilateral lower exterminators ulcers. She remains on IV antibiotic therapy via a PICC line per Dr. Ola Spurr; she has a follow-up appointment with him on Monday, 12/02/2016. She anticipates that the about therapy will be discontinued at that appointment. She denies any complications of  nausea vomiting and/or diarrhea accompanied by this antibiotic therapy. She admits to using her lymphedema pumps twice daily with the exception of yesterday. She denies any concerns or complications with the current treatment plan. 12/06/2016 -- Dr. Ola Spurr saw her recently on 12/02/2016 and due to the fact she has had significant problems with recurrent ulceration and Pseudomonas infection he recommended 3 more weeks of antibiotics and extended it until December 25. The PICC line will be in place. Last hemoglobin A1c on November 30 was 9% 12/27/2016 -- because of the holidays the patient's diet has been higher in salt, her dressings have not been done as required and she is not used KEIONNA, KINNAIRD. ([id number]) to lymphedema pumps appropriately. This has led to her lymphedema increasing markedly. 01/24/2017 -- the patient's hospital bed is malfunctioning and hence her legs have been flat in bed and her lymphedema is increased markedly. During my examination I also noted a unstageable pressure injury to her right heel 02/07/2017 -- she has returned after 2 weeks as she was done with influenza and has been in bed a lot, with a new bed which has helped her reduce the edema due to proper elevation. Will she has a full-fledged large pressure injury to the right heel. 02/28/2017 -- her dressing was being taken down with scissors she had a sharp injury to the left medial calf area which is fairly superficial. READMISSION 11/15/2020 This is a now 69 year old woman who is a type II diabetic. She was here for a prolonged period in 2017 and 18 with bilateral lower extremity wounds complicated by cellulitis. Ultimately discharged with juxta lites and lymphedema pumps. She was felt to have chronic venous insufficiency and lymphedema. Her current problem started in August with a weeping area on the posterior left calf that rapidly expanded. She required admission to hospital from 08/29/2020 through  09/05/2020. She was seen by Dr. Delana Meyer. 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 On 9/3 she underwent an angioplasty and stenting of the left popliteal artery and an angioplasty of the left anterior tibial artery. 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 The posterior tibial artery is nonvisualized. X-ray of the  left leg in the hospital was negative sedimentation rate 47 she was treated with IV Zyvox and discharged on p.o. clindamycin. She did also have a venous reflux study that showed no DVT or superficial thrombosis she did have left common femoral vein and left greater saphenous vein reflux at the thigh and knee. She was followed by vein and vascular I saw her at the end of October. They have been using Santyl. 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 She presented with altered LOC She is back at home. She is very immobile. She requires a [employer] fast streamer for transfer. She is being cared for by her husband she has [facility] home health. I think they are just applying ABD pads and Kerlix to both wound areas. Topical dressing probably Santyl Past medical history includes lymphedema, hypertension, type 2 diabetes, CVA, chronic kidney disease, post thrombotic syndrome, Noninvasive arterial studies were followed up on 9/29 ABIs were noncompressible bilaterally. TBI was 0.59 on the right and 0.53 on the left biphasic waveforms bilaterally. Patient History Information obtained from Patient. Allergies AQUACELL AG DRESSING (Reaction: BURN) Family History Cancer, Diabetes - Siblings, Hypertension, Kidney Disease, Stroke, No family history of Heart Disease, Hereditary Spherocytosis, Lung Disease, Seizures, Thyroid Problems, Tuberculosis. Social History Former smoker - quit 25 yrs ago, Marital Status - Married, Alcohol Use - Never, Drug Use - No  History, Caffeine Use - Daily. Objective Constitutional Patient is hypotensive. However appears stable.Marland Kitchen Heart rate irregular. Rate about 110.Marland Kitchen Respirations regular, non-labored and within target range.. Temperature is normal and within the target range for the patient.. Very frail woman. No mobile. She is alert and answers questions.. Vitals Time Taken: 9:00 AM, Height: 67 in, Weight: 270 lbs, BMI: 42.3, Temperature: 98 F, Respiratory Rate: 22 breaths/min, Blood Pressure: 99/58 mmHg. Cardiovascular Somewhat tachycardic this may be A. fib I did not see this on her problem list. But I will need to look back further. Jugular venous pressure was not elevated. I could not feel femoral pulse or popliteal pulse. Not feel her dorsalis pedis or posterior tibial. Nonpitting edema, right greater than left suggestive of lymphedema that she was diagnosed with years ago. Psychiatric No evidence of depression, anxiety, or agitation. Calm, cooperative, and communicative. Appropriate interactions and affect.. General Notes: Wound exam; this patient is not easy to examine. She cannot roll over in the examining chair. Propping the leg up I was able to look at the wound on the posterior calf. This is mostly necrotic. I was able to remove black necrotic material as well as nonviable subcutaneous JEANENNE, LICEA. ([id number]) material with pickups and a #10 scalpel. Underneath this very deep structures I think exposed tendon distally although it is very difficult to get a look at this. The left heel tip black eschar over the surface. I did not attempt to debride this Integumentary (Hair, Skin) Wound #7 status is Open. Original cause of wound was Pressure Injury. The wound is located on the Left,Lateral,Posterior Lower Leg. The wound measures 13.4cm length x 6cm width x 1.1cm depth; 63.146cm^2 area and 69.461cm^3 volume. There is Fat Layer (Subcutaneous Tissue) exposed. There is no tunneling or undermining noted.  There is a large amount of serosanguineous drainage noted. There is small (1-33%) red granulation within the wound bed. There is a large (67-100%) amount of necrotic tissue within the wound bed including Eschar and Adherent Slough. Wound #8 status is Open. Original cause of wound was  Pressure Injury. The wound is located on the Left Calcaneus. The wound measures 6cm length x 2.8cm width x 0.4cm depth; 13.195cm^2 area and 5.278cm^3 volume. There is no tunneling or undermining noted. There is a small amount of serous drainage noted. The wound margin is flat and intact. There is no granulation within the wound bed. There is a large (67-100%) amount of necrotic tissue within the wound bed including Eschar. 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 There was a Excisional Skin/Subcutaneous Tissue/Muscle Debridement with a total area of 80.4 sq cm performed by Ricard Dillon, MD. With the following instrument(s): Blade, and Forceps to remove Viable and Non-Viable tissue/material. Material removed includes Fat, Eschar, Tendon, Subcutaneous Tissue, and Slough. No specimens were taken. A time out was conducted at 10:11, prior to the start of the procedure. A Moderate amount of bleeding was controlled with Pressure. The procedure was tolerated well. Post Debridement Measurements: 13.6cm length x 1.5cm width x 1.1cm depth; 17.624cm^3 volume. Character of Wound/Ulcer Post Debridement is stable. Severity of Tissue Post Debridement is: Other severity  specified. Post procedure Diagnosis Wound #7: Same as Pre-Procedure Plan The following medication(s) was prescribed: Santyl topical 250 unit/gram ointment ointment topical to affected areas change daily for wounds left calf and left heel starting 11/15/2020 Coloplast Paste miscellaneous unspec unspec paste paste miscellaneous to affected area daily starting 11/15/2020 #1 I think these are probably arterial wounds as suggested by Dr. Delana Meyer when she was in hospital in September. He was fairly direct saying that he felt this was dry gangrene. Confounding variables include lymphedema and possibly pressure injury. 2. The area on her posterior calf is necrotic and there is necrotic subcutaneous tissue. It was difficult to see how far this goes down but I think there is exposed tendon at the base of this. For now I agree with the Santyl that was previously prescribed and I have represcribed this for them 3. The area on the left heel would also be a difficult debridement. She has good sensation very sensitive to pain. 4. She has been revascularized including the popliteal artery [stented] and angioplasty to the anterior tibial. Unfortunately these are probably supplied by the posterior tibial. I will probably end up sending her back to see vein and vascular Dr. Delana Meyer in particular to see if there is anything he felt could be done with the posterior tibial artery although by the tone of the angiogram that probably would not be the case 5. She has lymphedema. She might benefit from compression although I did not do that today. I did suggest she might try using her compression pump on the left leg and see if that is tolerable. She is already using it on the right I spent 45 minutes in review of this patient's past medical history, arterial studies angiogram, face-to-face evaluation and preparation of this record ALAYCIA, EARDLEY ([id number]) Electronic Signature(s) Signed: 11/21/2020 6:44:03 AM By:  Linton Ham MD Entered By: Linton Ham on 11/15/2020 10:47:14 [name] ([id number]) -------------------------------------------------------------------------------- ROS/PFSH Details Patient Name: Brooke Hopkins, Brooke Hopkins. Date of Service: 11/15/2020 9:15 AM Medical Record Number: [id number] Patient Account Number: [id number] Date of Birth/Sex:  1951/11/19 (69 y.o. Female) Treating RN: Dolan Amen Primary Care Provider: Lavera Guise Other Clinician: Referring Provider: Lavera Guise Treating Provider/Extender: Tito Dine in Treatment: 0 Information Obtained From Patient Immunizations Pneumococcal Vaccine: Received Pneumococcal Vaccination: Yes Implantable Devices No devices added Family and Social History Cancer: Yes; Diabetes: Yes - Siblings; Heart Disease: No; Hereditary Spherocytosis: No; Hypertension: Yes; Kidney Disease: Yes; Lung Disease: No; Seizures: No; Stroke: Yes; Thyroid Problems: No; Tuberculosis: No; Former smoker - quit 25 yrs ago; Marital Status - Married; Alcohol Use: Never; Drug Use: No History; Caffeine Use: Daily; Financial Concerns: No; Food, Clothing or Shelter Needs: No; Support System Lacking: No; Transportation Concerns: No Electronic Signature(s) Signed: 11/15/2020 11:12:32 AM By: Charlett Nose Signed: 11/21/2020 6:44:03 AM By: Linton Ham MD Entered By: Georges Mouse, Minus Breeding on 11/15/2020 09:20:26 [name] ([id number]) -------------------------------------------------------------------------------- SuperBill Details Patient Name: Brooke Hopkins, Brooke Hopkins. Date of Service: 11/15/2020 Medical Record Number: [id number] Patient Account Number: [id number] Date of Birth/Sex: March 30, 1951 (69 y.o. 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 I89.0 Lymphedema, not elsewhere classified Facility Procedures CPT4 Code: 02637858 Description: [place] VISIT-LEV 3 EST PT Modifier: Quantity: 1 CPT4 Code: 85027741 Description: 28786 - DEB MUSC/FASCIA 20 SQ CM/< Modifier: Quantity: 1 CPT4 Code: Description: ICD-10 Diagnosis Description L97.228 Non-pressure chronic ulcer of left calf with other specified severity Modifier: Quantity: CPT4 Code: 76720947 Description: 11046 - DEB MUSC/FASCIA EA ADDL 20 CM Modifier: Quantity: 4 CPT4 Code: Description: ICD-10 Diagnosis Description L97.228 Non-pressure chronic ulcer of left calf with other specified severity Modifier: Quantity: Physician Procedures CPT4 Code: 0962836 Description: 62947 - WC PHYS LEVEL 4 - NEW PT Modifier: Quantity: 1 CPT4 Code: Description: ICD-10 Diagnosis Description E11.52 Type 2 diabetes mellitus with diabetic peripheral angiopathy with gangrene L97.228 Non-pressure chronic ulcer of left calf with other specified severity L97.428 Non-pressure chronic ulcer of left heel and  midfoot with other specified s I89.0 Lymphedema, not elsewhere classified Modifier: everity Quantity: CPT4 Code: [id number] Description: 54656 - WC PHYS DEBR MUSCLE/FASCIA 20 SQ CM Modifier: Quantity: 1 CPT4 Code: Description: ICD-10 Diagnosis Description L97.228 Non-pressure chronic ulcer of left calf with other specified severity Modifier: Quantity: CPT4 Code: 8127517 Description: 11046 - WC PHYS DEB MUSC/FASC EA ADDL 20 CM Modifier: Quantity: 4 CPT4 Code: Description: ICD-10 Diagnosis Description L97.228 Non-pressure chronic ulcer of left calf with other specified severity Modifier: Quantity: Electronic  Signature(s) Signed: 11/21/2020 6:44:03 AM By: Linton Ham MD Entered By: Linton Ham on 11/15/2020 10:47:47

## 2020-11-24 ENCOUNTER — Inpatient Hospital Stay: Payer: Medicare HMO

## 2020-11-24 ENCOUNTER — Inpatient Hospital Stay
Admission: EM | Admit: 2020-11-24 | Discharge: 2020-12-07 | DRG: 239 | Disposition: A | Discharge status: Hospice | Payer: Medicare HMO | Attending: Internal Medicine | Admitting: Internal Medicine

## 2020-11-24 ENCOUNTER — Emergency Department: Payer: Medicare HMO

## 2020-11-24 ENCOUNTER — Encounter: Payer: Self-pay | Admitting: Emergency Medicine

## 2020-11-24 ENCOUNTER — Other Ambulatory Visit: Payer: Self-pay

## 2020-11-24 DIAGNOSIS — E6609 Other obesity due to excess calories: Secondary | ICD-10-CM | POA: Diagnosis present

## 2020-11-24 DIAGNOSIS — E1152 Type 2 diabetes mellitus with diabetic peripheral angiopathy with gangrene: Secondary | ICD-10-CM | POA: Diagnosis present

## 2020-11-24 DIAGNOSIS — E11621 Type 2 diabetes mellitus with foot ulcer: Secondary | ICD-10-CM | POA: Diagnosis present

## 2020-11-24 DIAGNOSIS — N1831 Chronic kidney disease, stage 3a: Secondary | ICD-10-CM | POA: Diagnosis present

## 2020-11-24 DIAGNOSIS — E1142 Type 2 diabetes mellitus with diabetic polyneuropathy: Secondary | ICD-10-CM | POA: Diagnosis present

## 2020-11-24 DIAGNOSIS — I4891 Unspecified atrial fibrillation: Secondary | ICD-10-CM

## 2020-11-24 DIAGNOSIS — R778 Other specified abnormalities of plasma proteins: Secondary | ICD-10-CM | POA: Diagnosis not present

## 2020-11-24 DIAGNOSIS — Z7982 Long term (current) use of aspirin: Secondary | ICD-10-CM

## 2020-11-24 DIAGNOSIS — I480 Paroxysmal atrial fibrillation: Secondary | ICD-10-CM

## 2020-11-24 DIAGNOSIS — M86272 Subacute osteomyelitis, left ankle and foot: Secondary | ICD-10-CM

## 2020-11-24 DIAGNOSIS — N186 End stage renal disease: Secondary | ICD-10-CM | POA: Diagnosis not present

## 2020-11-24 DIAGNOSIS — E11649 Type 2 diabetes mellitus with hypoglycemia without coma: Secondary | ICD-10-CM | POA: Diagnosis present

## 2020-11-24 DIAGNOSIS — L8990 Pressure ulcer of unspecified site, unspecified stage: Secondary | ICD-10-CM | POA: Diagnosis present

## 2020-11-24 DIAGNOSIS — I69365 Other paralytic syndrome following cerebral infarction, bilateral: Secondary | ICD-10-CM

## 2020-11-24 DIAGNOSIS — I5020 Unspecified systolic (congestive) heart failure: Secondary | ICD-10-CM | POA: Diagnosis not present

## 2020-11-24 DIAGNOSIS — L89893 Pressure ulcer of other site, stage 3: Secondary | ICD-10-CM | POA: Diagnosis not present

## 2020-11-24 DIAGNOSIS — I429 Cardiomyopathy, unspecified: Secondary | ICD-10-CM | POA: Diagnosis not present

## 2020-11-24 DIAGNOSIS — L89323 Pressure ulcer of left buttock, stage 3: Secondary | ICD-10-CM | POA: Diagnosis present

## 2020-11-24 DIAGNOSIS — Z452 Encounter for adjustment and management of vascular access device: Secondary | ICD-10-CM

## 2020-11-24 DIAGNOSIS — Z6841 Body Mass Index (BMI) 40.0 and over, adult: Secondary | ICD-10-CM | POA: Diagnosis not present

## 2020-11-24 DIAGNOSIS — M25561 Pain in right knee: Secondary | ICD-10-CM | POA: Diagnosis not present

## 2020-11-24 DIAGNOSIS — Z833 Family history of diabetes mellitus: Secondary | ICD-10-CM

## 2020-11-24 DIAGNOSIS — L97329 Non-pressure chronic ulcer of left ankle with unspecified severity: Secondary | ICD-10-CM | POA: Diagnosis not present

## 2020-11-24 DIAGNOSIS — E11311 Type 2 diabetes mellitus with unspecified diabetic retinopathy with macular edema: Secondary | ICD-10-CM | POA: Diagnosis present

## 2020-11-24 DIAGNOSIS — K7200 Acute and subacute hepatic failure without coma: Secondary | ICD-10-CM | POA: Diagnosis present

## 2020-11-24 DIAGNOSIS — I10 Essential (primary) hypertension: Secondary | ICD-10-CM | POA: Diagnosis present

## 2020-11-24 DIAGNOSIS — Z66 Do not resuscitate: Secondary | ICD-10-CM | POA: Diagnosis present

## 2020-11-24 DIAGNOSIS — D509 Iron deficiency anemia, unspecified: Secondary | ICD-10-CM | POA: Diagnosis not present

## 2020-11-24 DIAGNOSIS — Z515 Encounter for palliative care: Secondary | ICD-10-CM | POA: Diagnosis not present

## 2020-11-24 DIAGNOSIS — E1122 Type 2 diabetes mellitus with diabetic chronic kidney disease: Secondary | ICD-10-CM | POA: Diagnosis present

## 2020-11-24 DIAGNOSIS — I70262 Atherosclerosis of native arteries of extremities with gangrene, left leg: Secondary | ICD-10-CM | POA: Diagnosis present

## 2020-11-24 DIAGNOSIS — N170 Acute kidney failure with tubular necrosis: Secondary | ICD-10-CM | POA: Diagnosis present

## 2020-11-24 DIAGNOSIS — R7401 Elevation of levels of liver transaminase levels: Secondary | ICD-10-CM | POA: Diagnosis not present

## 2020-11-24 DIAGNOSIS — K7469 Other cirrhosis of liver: Secondary | ICD-10-CM

## 2020-11-24 DIAGNOSIS — M1711 Unilateral primary osteoarthritis, right knee: Secondary | ICD-10-CM | POA: Diagnosis present

## 2020-11-24 DIAGNOSIS — D151 Benign neoplasm of heart: Secondary | ICD-10-CM | POA: Diagnosis not present

## 2020-11-24 DIAGNOSIS — I130 Hypertensive heart and chronic kidney disease with heart failure and stage 1 through stage 4 chronic kidney disease, or unspecified chronic kidney disease: Secondary | ICD-10-CM | POA: Diagnosis present

## 2020-11-24 DIAGNOSIS — Z79899 Other long term (current) drug therapy: Secondary | ICD-10-CM

## 2020-11-24 DIAGNOSIS — Z7401 Bed confinement status: Secondary | ICD-10-CM

## 2020-11-24 DIAGNOSIS — I739 Peripheral vascular disease, unspecified: Secondary | ICD-10-CM | POA: Diagnosis not present

## 2020-11-24 DIAGNOSIS — I5022 Chronic systolic (congestive) heart failure: Secondary | ICD-10-CM | POA: Diagnosis present

## 2020-11-24 DIAGNOSIS — N185 Chronic kidney disease, stage 5: Secondary | ICD-10-CM | POA: Diagnosis not present

## 2020-11-24 DIAGNOSIS — I83023 Varicose veins of left lower extremity with ulcer of ankle: Secondary | ICD-10-CM | POA: Diagnosis not present

## 2020-11-24 DIAGNOSIS — Z825 Family history of asthma and other chronic lower respiratory diseases: Secondary | ICD-10-CM

## 2020-11-24 DIAGNOSIS — E785 Hyperlipidemia, unspecified: Secondary | ICD-10-CM | POA: Diagnosis present

## 2020-11-24 DIAGNOSIS — I340 Nonrheumatic mitral (valve) insufficiency: Secondary | ICD-10-CM | POA: Diagnosis not present

## 2020-11-24 DIAGNOSIS — Z20822 Contact with and (suspected) exposure to covid-19: Secondary | ICD-10-CM | POA: Diagnosis present

## 2020-11-24 DIAGNOSIS — N179 Acute kidney failure, unspecified: Secondary | ICD-10-CM | POA: Diagnosis not present

## 2020-11-24 DIAGNOSIS — E860 Dehydration: Secondary | ICD-10-CM | POA: Diagnosis present

## 2020-11-24 DIAGNOSIS — E1169 Type 2 diabetes mellitus with other specified complication: Secondary | ICD-10-CM | POA: Diagnosis present

## 2020-11-24 DIAGNOSIS — I87013 Postthrombotic syndrome with ulcer of bilateral lower extremity: Secondary | ICD-10-CM | POA: Diagnosis present

## 2020-11-24 DIAGNOSIS — L97423 Non-pressure chronic ulcer of left heel and midfoot with necrosis of muscle: Secondary | ICD-10-CM | POA: Diagnosis present

## 2020-11-24 DIAGNOSIS — M25569 Pain in unspecified knee: Secondary | ICD-10-CM

## 2020-11-24 DIAGNOSIS — I4819 Other persistent atrial fibrillation: Principal | ICD-10-CM | POA: Diagnosis present

## 2020-11-24 DIAGNOSIS — I361 Nonrheumatic tricuspid (valve) insufficiency: Secondary | ICD-10-CM | POA: Diagnosis not present

## 2020-11-24 DIAGNOSIS — M86672 Other chronic osteomyelitis, left ankle and foot: Secondary | ICD-10-CM | POA: Diagnosis not present

## 2020-11-24 DIAGNOSIS — M869 Osteomyelitis, unspecified: Secondary | ICD-10-CM

## 2020-11-24 DIAGNOSIS — Z7902 Long term (current) use of antithrombotics/antiplatelets: Secondary | ICD-10-CM

## 2020-11-24 DIAGNOSIS — Z794 Long term (current) use of insulin: Secondary | ICD-10-CM

## 2020-11-24 DIAGNOSIS — I313 Pericardial effusion (noninflammatory): Secondary | ICD-10-CM | POA: Diagnosis not present

## 2020-11-24 DIAGNOSIS — Z8249 Family history of ischemic heart disease and other diseases of the circulatory system: Secondary | ICD-10-CM

## 2020-11-24 DIAGNOSIS — Z7189 Other specified counseling: Secondary | ICD-10-CM | POA: Diagnosis not present

## 2020-11-24 DIAGNOSIS — D6869 Other thrombophilia: Secondary | ICD-10-CM | POA: Diagnosis not present

## 2020-11-24 DIAGNOSIS — E66813 Obesity, class 3: Secondary | ICD-10-CM

## 2020-11-24 DIAGNOSIS — L89610 Pressure ulcer of right heel, unstageable: Secondary | ICD-10-CM | POA: Diagnosis present

## 2020-11-24 DIAGNOSIS — I513 Intracardiac thrombosis, not elsewhere classified: Secondary | ICD-10-CM | POA: Diagnosis not present

## 2020-11-24 DIAGNOSIS — G8250 Quadriplegia, unspecified: Secondary | ICD-10-CM | POA: Diagnosis present

## 2020-11-24 DIAGNOSIS — R7989 Other specified abnormal findings of blood chemistry: Secondary | ICD-10-CM | POA: Diagnosis present

## 2020-11-24 DIAGNOSIS — I959 Hypotension, unspecified: Secondary | ICD-10-CM | POA: Diagnosis present

## 2020-11-24 LAB — CBC
HCT: 34.2 % — ABNORMAL LOW (ref 36.0–46.0)
Hemoglobin: 10 g/dL — ABNORMAL LOW (ref 12.0–15.0)
MCH: 24.7 pg — ABNORMAL LOW (ref 26.0–34.0)
MCHC: 29.2 g/dL — ABNORMAL LOW (ref 30.0–36.0)
MCV: 84.4 fL (ref 80.0–100.0)
Platelets: 277 10*3/uL (ref 150–400)
RBC: 4.05 MIL/uL (ref 3.87–5.11)
RDW: 16.8 % — ABNORMAL HIGH (ref 11.5–15.5)
WBC: 10.1 10*3/uL (ref 4.0–10.5)
nRBC: 0 % (ref 0.0–0.2)

## 2020-11-24 LAB — COMPREHENSIVE METABOLIC PANEL
ALT: 401 U/L — ABNORMAL HIGH (ref 0–44)
AST: 463 U/L — ABNORMAL HIGH (ref 15–41)
Albumin: 2.8 g/dL — ABNORMAL LOW (ref 3.5–5.0)
Alkaline Phosphatase: 105 U/L (ref 38–126)
Anion gap: 15 (ref 5–15)
BUN: 76 mg/dL — ABNORMAL HIGH (ref 8–23)
CO2: 19 mmol/L — ABNORMAL LOW (ref 22–32)
Calcium: 8 mg/dL — ABNORMAL LOW (ref 8.9–10.3)
Chloride: 107 mmol/L (ref 98–111)
Creatinine, Ser: 4.57 mg/dL — ABNORMAL HIGH (ref 0.44–1.00)
GFR, Estimated: 10 mL/min — ABNORMAL LOW (ref >60)
Glucose, Bld: 90 mg/dL (ref 70–99)
Potassium: 4.4 mmol/L (ref 3.5–5.1)
Sodium: 141 mmol/L (ref 135–145)
Total Bilirubin: 1.3 mg/dL — ABNORMAL HIGH (ref 0.3–1.2)
Total Protein: 6.3 g/dL — ABNORMAL LOW (ref 6.5–8.1)

## 2020-11-24 LAB — TROPONIN I (HIGH SENSITIVITY)
Troponin I (High Sensitivity): 194 ng/L (ref <18)
Troponin I (High Sensitivity): 190 ng/L (ref <18)
Troponin I (High Sensitivity): 205 ng/L (ref <18)
Troponin I (High Sensitivity): 212 ng/L (ref <18)

## 2020-11-24 LAB — HEPATITIS B SURFACE ANTIBODY,QUALITATIVE: Hep B S Ab: NONREACTIVE

## 2020-11-24 LAB — RESP PANEL BY RT-PCR (FLU A&B, COVID) ARPGX2
Influenza A by PCR: NEGATIVE
Influenza B by PCR: NEGATIVE
SARS Coronavirus 2 by RT PCR: NEGATIVE

## 2020-11-24 LAB — HEPATITIS B SURFACE ANTIGEN: Hepatitis B Surface Ag: NONREACTIVE

## 2020-11-24 MED ORDER — DILTIAZEM HCL-DEXTROSE 125-5 MG/125ML-% IV SOLN (PREMIX)
5.0000 mg/h | INTRAVENOUS | Status: DC
  Administered 2020-11-24: 5 mg/h via INTRAVENOUS
  Filled 2020-11-24: qty 125

## 2020-11-24 MED ORDER — CLOPIDOGREL BISULFATE 75 MG PO TABS
75.0000 mg | ORAL_TABLET | Freq: Every day | ORAL | Status: DC
  Administered 2020-11-25 – 2020-11-27 (×3): 75 mg via ORAL
  Filled 2020-11-24 (×4): qty 1

## 2020-11-24 MED ORDER — AMIODARONE HCL 200 MG PO TABS
400.0000 mg | ORAL_TABLET | Freq: Two times a day (BID) | ORAL | Status: DC
  Administered 2020-11-25: 400 mg via ORAL
  Filled 2020-11-24: qty 2

## 2020-11-24 MED ORDER — APIXABAN 5 MG PO TABS
5.0000 mg | ORAL_TABLET | Freq: Two times a day (BID) | ORAL | Status: DC
  Administered 2020-11-24 – 2020-11-28 (×8): 5 mg via ORAL
  Filled 2020-11-24: qty 2
  Filled 2020-11-24 (×2): qty 1
  Filled 2020-11-24: qty 2
  Filled 2020-11-24 (×2): qty 1
  Filled 2020-11-24: qty 2
  Filled 2020-11-24: qty 1
  Filled 2020-11-24: qty 2
  Filled 2020-11-24: qty 1

## 2020-11-24 MED ORDER — HYDROCODONE-ACETAMINOPHEN 5-325 MG PO TABS
1.0000 | ORAL_TABLET | Freq: Four times a day (QID) | ORAL | Status: DC | PRN
  Administered 2020-11-24 – 2020-12-05 (×16): 1 via ORAL
  Filled 2020-11-24 (×17): qty 1

## 2020-11-24 MED ORDER — SODIUM CHLORIDE 0.9 % IV BOLUS
1000.0000 mL | Freq: Once | INTRAVENOUS | Status: AC
  Administered 2020-11-24: 1000 mL via INTRAVENOUS

## 2020-11-24 MED ORDER — ONDANSETRON HCL 4 MG/2ML IJ SOLN: 4.0000 mg | Freq: Once | INTRAMUSCULAR | Status: AC

## 2020-11-24 MED ORDER — ATORVASTATIN CALCIUM 80 MG PO TABS: 80.0000 mg | ORAL_TABLET | Freq: Every day | ORAL | Status: DC

## 2020-11-24 MED ORDER — SODIUM CHLORIDE 0.9 % IV SOLN: INTRAVENOUS | Status: DC

## 2020-11-24 MED ORDER — ONDANSETRON HCL 4 MG/2ML IJ SOLN
4.0000 mg | Freq: Four times a day (QID) | INTRAMUSCULAR | Status: DC | PRN
  Administered 2020-11-24 – 2020-11-27 (×3): 4 mg via INTRAVENOUS
  Filled 2020-11-24 (×3): qty 2

## 2020-11-24 MED ORDER — COLLAGENASE 250 UNIT/GM EX OINT
TOPICAL_OINTMENT | Freq: Every day | CUTANEOUS | Status: DC
  Administered 2020-12-04: 1 via TOPICAL
  Filled 2020-11-24: qty 30

## 2020-11-24 MED ORDER — ETOMIDATE 2 MG/ML IV SOLN
10.0000 mg | Freq: Once | INTRAVENOUS | Status: AC
  Administered 2020-11-24: 10 mg via INTRAVENOUS
  Filled 2020-11-24: qty 10

## 2020-11-24 MED ORDER — HEPARIN SODIUM (PORCINE) 5000 UNIT/ML IJ SOLN: 5000.0000 [IU] | Freq: Three times a day (TID) | INTRAMUSCULAR | Status: DC

## 2020-11-24 MED ORDER — AMIODARONE HCL 200 MG PO TABS: 200.0000 mg | ORAL_TABLET | Freq: Two times a day (BID) | ORAL | Status: DC

## 2020-11-24 MED ORDER — ONDANSETRON HCL 4 MG/2ML IJ SOLN
INTRAMUSCULAR | Status: AC
  Administered 2020-11-24: 4 mg via INTRAVENOUS
  Filled 2020-11-24: qty 2

## 2020-11-24 MED ORDER — ASPIRIN 81 MG PO TBEC: 81.0000 mg | DELAYED_RELEASE_TABLET | Freq: Every day | ORAL | Status: DC

## 2020-11-24 MED ORDER — ACETAMINOPHEN 325 MG PO TABS
650.0000 mg | ORAL_TABLET | ORAL | Status: DC | PRN
  Administered 2020-12-01 – 2020-12-02 (×2): 650 mg via ORAL
  Filled 2020-11-24 (×4): qty 2

## 2020-11-24 NOTE — ED Notes (Signed)
Pt removed from BVM and placed back on 2L [place].

## 2020-11-24 NOTE — ED Notes (Signed)
Radiology at bedside

## 2020-11-24 NOTE — H&P (Signed)
History and Physical    Brooke Hopkins Brooke Hopkins DOB: 02-06-1951 DOA: 11/24/2020  PCP: Lynnell Jude, MD   Patient coming from: Home  I have personally briefly reviewed patient's old medical records in [place]  Chief Complaint: Rapid heart rate  HPI: Brooke Hopkins is a 69 y.o. 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  When EMS arrived patient's blood sugar was 50 and family had offered her some juice to drink, repeat blood sugar was 116.  During the EMS evaluation she was noted to be tachycardic with heart rate between 1 15-20 and appeared to be in atrial fibrillation.  Patient does not have a known history of A. fib.  She was brought into the ER for further evaluation. She complains of shortness of breath and palpitations but denies having any chest, no nausea, no vomiting, no abdominal pain, no changes in bowel habits, no diaphoresis or palpitations.  She complained about pain in her left knee which happened during transport to the ER. EMS had administered 2 doses of IV metoprolol 5 mg without any improvement in her heart rate.  She was then started on a Cardizem drip in the emergency room but remained tachycardic and hypotensive with systolic blood pressure in the 80s. Labs show sodium 141, potassium 4.4, chloride 107, bicarb 19, glucose 90, BUN 76, creatinine 4.57, calcium 8.0, alkaline phosphatase 105, albumin 2.8, AST 463, ALT 401, total protein 6.3, total bilirubin 1.3, troponin I94 >> 190, white count 10.1, hemoglobin 10.0, hematocrit 35.2, MCV 84, RDW 16.8, platelet count 277 Respiratory viral panel was negative Chest x-ray reviewed by me shows pulmonary vascular congestion Initial twelve-lead EKG showed rapid atrial  fibrillation Repeat twelve-lead EKG following cardioversion shows sinus rhythm     ED Course: Patient is a morbidly obese Caucasian female who is bedbound from a prior stroke and who was brought into the ER by EMS for evaluation of rapid heart rate.  She was found to be in A. fib which is new and did not respond to IV metoprolol.  She was started on a Cardizem drip in the emergency room and became hypotensive requiring cardioversion.  She is currently in sinus rhythm.  Her troponin is slightly elevated.  She will be admitted to the hospital for further evaluation  Review of Systems: As per HPI otherwise 10 point review of systems negative.    Past Medical History:  Diagnosis Date  . Chronic kidney disease   . Diabetes mellitus   . Hypertension   . Stroke [facility])     Past Surgical History:  Procedure Laterality Date  . ABDOMINAL HYSTERECTOMY    . CHOLECYSTECTOMY    . HAMMER TOE SURGERY    . KNEE ARTHROSCOPY    . LOWER EXTREMITY ANGIOGRAPHY Left 09/01/2020   Procedure: Lower Extremity Angiography;  Surgeon: Katha Cabal, MD;  Location: [place];  Service: Cardiovascular;  Laterality: Left;  . LUMBAR DISC SURGERY  11/07/11  . LUMBAR WOUND DEBRIDEMENT  11/27/2011   Procedure: LUMBAR WOUND DEBRIDEMENT;  Surgeon: Eustace Moore;  Location: [place] [facility];  Service: Neurosurgery;  Laterality: N/A;  . TONSILLECTOMY       reports that she has never smoked. She has never used smokeless tobacco. She reports that she  does not drink alcohol and does not use drugs.  No Known Allergies  Family History  Problem Relation Age of Onset  . CAD Mother   . Hypertension Mother   . COPD Father   . Diabetes Sister   . Hypertension Sister      Prior to Admission medications   Medication Sig Start Date End Date Taking? Authorizing Provider  labetalol (NORMODYNE) 100 MG tablet Take 1 tablet (100 mg total) by mouth 2 (two) times daily. 09/04/20 11/24/20 Yes Shahmehdi, Valeria Batman, MD   aspirin EC 81 MG EC tablet Take 1 tablet (81 mg total) by mouth daily. Swallow whole. 09/05/20   Shahmehdi, Valeria Batman, MD  atorvastatin (LIPITOR) 80 MG tablet Take 1 tablet (80 mg total) by mouth daily at 6 PM. 10/17/15   Gouru, Aruna, MD  clopidogrel (PLAVIX) 75 MG tablet Take 75 mg by mouth daily. 11/11/20   [provider]  collagenase (SANTYL) ointment Apply topically daily. 09/27/20   Kris Hartmann, NP  furosemide (LASIX) 20 MG tablet Take 1 tablet (20 mg total) by mouth daily. 09/14/20 09/14/21  Annita Brod, MD  hydrALAZINE (APRESOLINE) 25 MG tablet Take 1 tablet (25 mg total) by mouth every 8 (eight) hours. 06/21/17   Nicholes Mango, MD  insulin NPH-regular Human (NOVOLIN 70/30) (70-30) 100 UNIT/ML injection Inject 10 Units into the skin 2 (two) times daily with a meal. 09/14/20   Annita Brod, MD  lisinopril (ZESTRIL) 40 MG tablet Take 40 mg by mouth daily. 10/02/20   [provider]  metroNIDAZOLE (FLAGYL) 500 MG tablet  10/10/20   [provider]    Physical Exam: Vitals:   11/24/20 1200 11/24/20 1210 11/24/20 1220 11/24/20 1225  BP: (!) 87/46 (!) 98/54 (!) 104/59 (!) 100/44  Pulse: (!) 31  (!) 40 (!) 59  Resp: 17 16 17 18   Temp:      TempSrc:      SpO2: 100%  100% 100%  Weight:      Height:         Vitals:   11/24/20 1200 11/24/20 1210 11/24/20 1220 11/24/20 1225  BP: (!) 87/46 (!) 98/54 (!) 104/59 (!) 100/44  Pulse: (!) 31  (!) 40 (!) 59  Resp: 17 16 17 18   Temp:      TempSrc:      SpO2: 100%  100% 100%  Weight:      Height:        Constitutional: NAD, alert and oriented x 3.  Chronically ill-appearing Eyes: PERRL, lids and conjunctivae normal ENMT: Mucous membranes are moist.  Neck: normal, supple, no masses, no thyromegaly Respiratory: clear to auscultation bilaterally, no wheezing, no crackles. Normal respiratory effort. No accessory muscle use.  Cardiovascular: Irregularly irregular, tachycardic no murmurs / rubs / gallops.   Chronic lymphedema involving both lower extremities. 2+ pedal pulses. No carotid bruits.  Abdomen: no tenderness, no masses palpated. No hepatosplenomegaly. Bowel sounds positive.  Central adiposity Musculoskeletal: no clubbing / cyanosis. No joint deformity upper and lower extremities.  Skin: no rashes, lesions, ulcer involving the left heel and left calf.  Pressure injury to the sacral area Neurologic: Left-sided hemiparesis Psychiatric: Normal mood and affect.   Labs on Admission: I have personally reviewed following labs and imaging studies  CBC: Recent Labs  Lab 11/24/20 0934  WBC 10.1  HGB 10.0*  HCT 34.2*  MCV 84.4  PLT 726   Basic Metabolic Panel: Recent Labs  Lab 11/24/20 0934  NA 141  K 4.4  CL 107  CO2 19*  GLUCOSE 90  BUN 76*  CREATININE 4.57*  CALCIUM 8.0*   GFR: Estimated Creatinine Clearance: 15.6 mL/min (A) (by C-G formula based on SCr of 4.57 mg/dL (H)). Liver Function Tests: Recent Labs  Lab 11/24/20 0934  AST 463*  ALT 401*  ALKPHOS 105  BILITOT 1.3*  PROT 6.3*  ALBUMIN 2.8*   No results for input(s): LIPASE, AMYLASE in the last 168 hours. No results for input(s): AMMONIA in the last 168 hours. Coagulation Profile: No results for input(s): INR, PROTIME in the last 168 hours. Cardiac Enzymes: No results for input(s): CKTOTAL, CKMB, CKMBINDEX, TROPONINI in the last 168 hours. BNP (last 3 results) No results for input(s): PROBNP in the last 8760 hours. HbA1C: No results for input(s): HGBA1C in the last 72 hours. CBG: No results for input(s): GLUCAP in the last 168 hours. Lipid Profile: No results for input(s): CHOL, HDL, LDLCALC, TRIG, CHOLHDL, LDLDIRECT in the last 72 hours. Thyroid Function Tests: No results for input(s): TSH, T4TOTAL, FREET4, T3FREE, THYROIDAB in the last 72 hours. Anemia Panel: No results for input(s): VITAMINB12, FOLATE, FERRITIN, TIBC, IRON, RETICCTPCT in the last 72 hours. Urine analysis:    Component Value  Date/Time   COLORURINE YELLOW (A) 09/13/2020 0539   APPEARANCEUR HAZY (A) 09/13/2020 0539   LABSPEC 1.018 09/13/2020 0539   PHURINE 5.0 09/13/2020 0539   GLUCOSEU NEGATIVE 09/13/2020 0539   HGBUR MODERATE (A) 09/13/2020 0539   BILIRUBINUR NEGATIVE 09/13/2020 0539   KETONESUR NEGATIVE 09/13/2020 0539   PROTEINUR 100 (A) 09/13/2020 0539   NITRITE POSITIVE (A) 09/13/2020 0539   LEUKOCYTESUR SMALL (A) 09/13/2020 0539    Radiological Exams on Admission: DG Chest Portable 1 View  Result Date: 11/24/2020 CLINICAL DATA:  New onset atrial fibrillation.  Shortness of breath. EXAM: PORTABLE CHEST 1 VIEW COMPARISON:  Chest x-ray dated November 11, 2016. FINDINGS: The patient is rotated to the right, limiting evaluation. Stable cardiomediastinal silhouette with heart size at the upper limits of normal. Pulmonary vascular congestion. Possible small right pleural effusion. No consolidation or pneumothorax. No acute osseous abnormality. IMPRESSION: 1. Limited study due to patient rotation. Pulmonary vascular congestion and possible small right pleural effusion. Electronically Signed   By: Titus Dubin M.D.   On: 11/24/2020 09:51    EKG: Independently reviewed.  Rapid A. 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 fib with a rapid ventricular rate which is new She was treated with IV metoprolol with no response and was then started on a Cardizem drip Patient required cardioversion in the ER due to unstable atrial fibrillation She is currently  in sinus rhythm but has a CHA2DS2-VASc score 5 and has had a prior stroke.   She will benefit from long-term anticoagulation as secondary prophylaxis for another stroke We will consult cardiology Obtain 2D echocardiogram to assess LVEF    Acute kidney injury Most likely secondary to ATN from hypotension Lisinopril and furosemide Will hydrate patient and repeat  renal parameters in a.m. 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 Patient is bedbound and requires assistance with most activities of daily living Will require increased nursing care during this hospitalization She has pressure ulcers on her sacrum and lower extremity and will require frequent turning to prevent development of more ulcers    Morbid obesity (BMI 22.58) Complicates overall prognosis and care   Elevated troponin Most likely secondary to demand ischemia from tachycardia We will trend troponin level We will request cardiology consult   DVT prophylaxis: Heparin Code Status: DNR Family Communication: Greater than 50% of time was spent discussing patient's condition and plan of care with her and her husband at the bedside.  All questions and concerns were addressed.  CODE STATUS was discussed and she is a DNR Disposition Plan: Back to previous home environment Consults called: Nephrology/cardiology    Brooke Neuwirth MD [facility]     11/24/2020, 1:28 PM

## 2020-11-24 NOTE — ED Notes (Signed)
Cardioversion completed at [facility].

## 2020-11-24 NOTE — ED Provider Notes (Addendum)
[facility] Emergency Department Provider Note  Time seen: 9:34 AM  I have reviewed the triage vital signs and the nursing notes.   HISTORY  Chief Complaint Atrial Fibrillation   HPI Brooke Hopkins is a 69 y.o. female with a past medical history of CKD, diabetes, hypertension, prior CVA, bedbound, presents to the emergency department for rapid heart rate.  According to EMS they were called out to the patient's house where she lives with her family for low blood sugar.  They state blood sugar of 50 however EMS states when they arrived the patient was attempting to drink juice and her blood glucose was 116.  However during her evaluation I noted the patient to be quite tachycardic between 150 and 220, appeared to be atrial fibrillation which the patient denied a history of.  Patient was transported to the emergency department for evaluation of the emergency traffic.  Upon arrival appears chronically ill but does not appear to be in any acute distress.   Patient denies any fever or cough.  States slight shortness of breath.  No chest pain abdominal pain.  Does state right leg pain but states this is chronic.  Has lower extremity wraps in place.  Past Medical History:  Diagnosis Date  . Chronic kidney disease   . Diabetes mellitus   . Hypertension   . Stroke [facility])     Patient Active Problem List   Diagnosis Date Noted  . Bladder incontinence 09/27/2020  . Diabetic retinopathy with macular edema associated with type 2 diabetes mellitus ([place]) 09/27/2020  . Kidney stones 09/27/2020  . Hypertension 09/27/2020  . Exogenous obesity 09/27/2020  . Hypoglycemia 09/13/2020  . Acute metabolic encephalopathy 19/14/7829  . Acute CVA (cerebrovascular accident) ([place]) 09/13/2020  . Morbid obesity ([place]) 09/13/2020  . Decubitus ulcer 08/29/2020  . Essential hypertension   . Type 2 diabetes mellitus with foot ulcer ([place])   . UTI (urinary tract infection) 06/16/2017  .  Hypertensive urgency 06/16/2017  . Infection, Pseudomonas 08/28/2016  . Pure hypercholesterolemia 08/17/2016  . Lymphedema 08/17/2016  . Wound infection 08/16/2016  . 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 Cellulitis and abscess of leg 05/22/2016  . Chronic acquired lymphedema 04/03/2016  . Morbid obesity with BMI of 45.0-49.9, adult ([place]) 02/26/2016  . Pressure ulcer 10/17/2015  . Cellulitis 10/13/2015  . Venous stasis ulcer of ankle, left ([place]) 09/25/2015  . Type 2 diabetes mellitus ([place]) 07/06/2015  . Bilateral edema of lower extremity 06/12/2015  . 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 HLD (hyperlipidemia) 09/12/2014  . PN (peripheral neuropathy) 08/05/2014  . Venous stasis ulcer ([place]) 06/29/2010    Past Surgical History:  Procedure Laterality Date  . ABDOMINAL HYSTERECTOMY    . CHOLECYSTECTOMY    . HAMMER TOE SURGERY    . KNEE ARTHROSCOPY    . LOWER EXTREMITY ANGIOGRAPHY Left 09/01/2020   Procedure: Lower Extremity Angiography;  Surgeon: Katha Cabal, MD;  Location: [facility];  Service: Cardiovascular;  Laterality: Left;  . LUMBAR DISC SURGERY  11/07/11  . LUMBAR WOUND DEBRIDEMENT  11/27/2011   Procedure: LUMBAR WOUND DEBRIDEMENT;  Surgeon: Eustace Moore;  Location: [facility];  Service: Neurosurgery;  Laterality: N/A;  . TONSILLECTOMY      Prior to Admission medications   Medication Sig Start Date End Date Taking? Authorizing Provider  aspirin EC 81 MG EC tablet Take 1 tablet (81 mg total) by mouth daily. Swallow whole. 09/05/20  Deatra James, MD  atorvastatin (LIPITOR) 80 MG tablet Take 1 tablet (80 mg total) by mouth daily at 6 PM. 10/17/15   Gouru, Aruna, MD  ciprofloxacin (CIPRO) 750 MG tablet Take 1 tablet (750 mg total) by mouth 2 (two) times daily. Patient not taking: Reported on 10/25/2020 09/27/20   Kris Hartmann, NP  cloNIDine (CATAPRES - DOSED IN MG/24 HR) 0.2 mg/24hr patch Place 1 patch (0.2  mg total) onto the skin once a week. 10/17/15   Nicholes Mango, MD  collagenase (SANTYL) ointment Apply topically daily. 09/27/20   Kris Hartmann, NP  furosemide (LASIX) 20 MG tablet Take 1 tablet (20 mg total) by mouth daily. 09/14/20 09/14/21  Annita Brod, MD  hydrALAZINE (APRESOLINE) 25 MG tablet Take 1 tablet (25 mg total) by mouth every 8 (eight) hours. 06/21/17   Nicholes Mango, MD  insulin NPH-regular Human (NOVOLIN 70/30) (70-30) 100 UNIT/ML injection Inject 10 Units into the skin 2 (two) times daily with a meal. 09/14/20   Annita Brod, MD  labetalol (NORMODYNE) 100 MG tablet Take 1 tablet (100 mg total) by mouth 2 (two) times daily. 09/04/20 10/04/20  Shahmehdi, Valeria Batman, MD  lisinopril (ZESTRIL) 5 MG tablet Take 1 tablet (5 mg total) by mouth daily. 09/05/20 10/05/20  Deatra James, MD  metroNIDAZOLE (FLAGYL) 500 MG tablet  10/10/20   [provider]    No Known Allergies  Family History  Problem Relation Age of Onset  . CAD Mother   . Hypertension Mother   . COPD Father   . Diabetes Sister   . Hypertension Sister     Social History Social History   Tobacco Use  . Smoking status: Never Smoker  . Smokeless tobacco: Never Used  Vaping Use  . Vaping Use: Never used  Substance Use Topics  . Alcohol use: No  . Drug use: No    Review of Systems Constitutional: Negative for fever. Cardiovascular: Negative for chest pain. Respiratory: Mild intermittent shortness of breath Gastrointestinal: Negative for abdominal pain.  States nausea this morning with an episode of vomiting.  No diarrhea. Genitourinary: Negative for urinary compaints Musculoskeletal: Patient states worsening lower extremity edema. Neurological: Negative for headache All other ROS negative  ____________________________________________   PHYSICAL EXAM:  VITAL SIGNS: ED Triage Vitals  Enc Vitals Group     BP --      Pulse --      Resp --      Temp --      Temp src --      SpO2 --       Weight 11/24/20 0931 280 lb (127 kg)     Height 11/24/20 0931 5\' 7"  (1.702 m)     Head Circumference --      Peak Flow --      Pain Score 11/24/20 0930 8     Pain Loc --      Pain Edu? --      Excl. in [place]? --    Constitutional: Awake alert, no acute distress.  Chronically ill-appearing.  Patient states bedbound at baseline. Eyes: Normal exam ENT      Head: Normocephalic and atraumatic.      Mouth/Throat: Mucous membranes are moist. Cardiovascular: Irregular rhythm rate around 120 bpm. Respiratory: Normal respiratory effort without tachypnea nor retractions. Breath sounds are clear  Gastrointestinal: Soft and nontender. No distention.   Musculoskeletal: Mild tenderness bilaterally.  Moderate swelling bilaterally with lower extremity wraps in place below the  knee. Neurologic:  Normal speech and language. No gross focal neurologic deficits  Skin:  Skin is warm, dry and intact.  Psychiatric: Mood and affect are normal.   ____________________________________________    EKG EKG viewed and interpreted by myself shows atrial fibrillation with rapid ventricular response from 171 bpm with a narrow QRS, normal axis, normal intervals nonspecific ST changes.  ____________________________________________    RADIOLOGY  Pulmonary vascular congestion  ____________________________________________   INITIAL IMPRESSION / ASSESSMENT AND PLAN / ED COURSE  Pertinent labs & imaging results that were available during my care of the patient were reviewed by me and considered in my medical decision making (see chart for details).   Patient presents emergency department for rapid heart rate after being called out this morning for low blood glucose.  We will check labs including cardiac enzymes, chest x-ray and watch on cardiac monitoring.  We will obtain an EKG to evaluate for possible new onset atrial fibrillation.  We will continue to closely monitor while awaiting for the results.  Patient  agreeable to plan of care.  Patient did receive 5 mg of metoprolol IV by EMS prior to arrival.  Patient remains tachycardic despite 2 rounds of IV metoprolol by EMS.  Currently 1 60-1 80 A. fib RVR.  We will start the patient on diltiazem infusion.  Patient's labs have resulted showing elevated troponin of 194 highly suspect this is due to demand ischemia.  Acute renal failure with creatinine 4.5 baseline around 1.0.  Patient receiving IV fluids.  LFT elevation but reassuringly fairly normal total bilirubin.  Will require admission to the hospital service for further work-up and treatment.  Normal white blood cell count.  Chronic anemia.  Covid negative.  I reviewed patient's old EKG from 09/15/2020 that appears to be a sinus rhythm at that time.   ----------------------------------------- 12:30 PM on 11/24/2020 -----------------------------------------  Patient's blood pressure continued to drop down as low as 75/44.  Check multiple times.  Return diltiazem drip off patient's heart rate then went back to 180.  Given the atrial fibrillation with rapid ventricular response at a extremely high rate along with hypotension decision was made by myself to cardiovert.  Obtained verbal consent for emergent sedation and cardioversion.  Patient and husband agreeable.  Patient given 10 mg of etomidate and cardioverted synchronized at 50 J, without success.  Cardioverted synchronized at 100 J with success.  Diltiazem drip is off, fluids are infusing blood pressure is up to 027 systolic currently.  Patient initially quite sedated after sedation but now will awaken to voice will answer questions appropriately.   .Sedation  Date/Time: 11/24/2020 12:31 PM Performed by: Harvest Dark, MD Authorized by: Harvest Dark, MD   Consent:    Consent obtained:  Verbal and emergent situation (electronic informed consent)   Consent given by:  Patient and spouse   Risks discussed:  Dysrhythmia, inadequate  sedation, nausea, vomiting and respiratory compromise necessitating ventilatory assistance and intubation Universal protocol:    Procedure explained and questions answered to patient or proxy's satisfaction: yes     Relevant documents present and verified: yes     Test results available and properly labeled: yes     Imaging studies available: yes     Required blood products, implants, devices, and special equipment available: yes     Immediately prior to procedure a time out was called: yes     Patient identity confirmation method:  Arm band Indications:    Procedure performed:  Cardioversion   Procedure  necessitating sedation performed by:  Physician performing sedation Pre-sedation assessment:    Time since last food or drink:  Unknown   NPO status caution: urgency dictates proceeding with non-ideal NPO status     ASA classification: class 3 - patient with severe systemic disease     Neck mobility: reduced     Mallampati score:  I - soft palate, uvula, fauces, pillars visible   Pre-sedation assessments completed and reviewed: airway patency, cardiovascular function, hydration status, mental status and nausea/vomiting     Pre-sedation assessment completed:  11/24/2020 11:12 AM Immediate pre-procedure details:    Reassessment: Patient reassessed immediately prior to procedure     Reviewed: vital signs, relevant labs/tests and NPO status     Verified: bag valve mask available, emergency equipment available, intubation equipment available, IV patency confirmed, oxygen available, reversal medications available and suction available   Procedure details (see MAR for exact dosages):    Preoxygenation:  Nasal cannula   Sedation:  Etomidate   Intended level of sedation: deep   Analgesia:  None   Intra-procedure monitoring:  Blood pressure monitoring, continuous pulse oximetry, cardiac monitor, frequent vital sign checks and frequent LOC assessments   Intra-procedure events: airway compromise      Intra-procedure management:  Airway repositioning and BVM ventilation   Total Provider sedation time (minutes):  35 Post-procedure details:    Post-sedation assessment completed:  11/24/2020 12:33 PM   Attendance: Constant attendance by certified staff until patient recovered     Recovery: Patient returned to pre-procedure baseline     Post-sedation assessments completed and reviewed: airway patency, cardiovascular function, hydration status, mental status and respiratory function     Patient is stable for discharge or admission: yes     Patient tolerance:  Tolerated well, no immediate complications .Cardioversion  Date/Time: 11/24/2020 12:34 PM Performed by: Harvest Dark, MD Authorized by: Harvest Dark, MD   Consent:    Consent obtained:  Verbal and emergent situation   Consent given by:  Patient and spouse   Risks discussed:  Induced arrhythmia   Alternatives discussed:  No treatment Pre-procedure details:    Cardioversion basis:  Emergent   Rhythm:  Atrial fibrillation   Electrode placement:  Anterior-posterior Patient sedated: Yes. Refer to sedation procedure documentation for details of sedation.  Attempt one:    Cardioversion mode:  Synchronous   Waveform:  Biphasic   Shock (Joules):  50   Shock outcome:  No change in rhythm Attempt two:    Cardioversion mode:  Synchronous   Waveform:  Biphasic   Shock (Joules):  100   Shock outcome:  Conversion to normal sinus rhythm Post-procedure details:    Patient status:  Alert (after sedation )   Patient tolerance of procedure:  Tolerated well, no immediate complications    Repeat EKG viewed and interpreted by myself shows normal sinus rhythm at 52 bpm with frequent PVCs.  Narrow QRS, normal axis, normal intervals, no concerning ST changes.  Brooke Hopkins was evaluated in Emergency Department on 11/24/2020 for the symptoms described in the history of present illness. She was evaluated in the context of the  global COVID-19 pandemic, which necessitated consideration that the patient might be at risk for infection with the SARS-CoV-2 virus that causes COVID-19. Institutional protocols and algorithms that pertain to the evaluation of patients at risk for COVID-19 are in a state of rapid change based on information released by regulatory bodies including the CDC and federal and state organizations. These policies and algorithms  were followed during the patient's care in the ED.  CRITICAL CARE Performed by: Harvest Dark   Total critical care time: 45 minutes  Critical care time was exclusive of separately billable procedures and treating other patients.  Critical care was necessary to treat or prevent imminent or life-threatening deterioration.  Critical care was time spent personally by me on the following activities: development of treatment plan with patient and/or surrogate as well as nursing, discussions with consultants, evaluation of patient's response to treatment, examination of patient, obtaining history from patient or surrogate, ordering and performing treatments and interventions, ordering and review of laboratory studies, ordering and review of radiographic studies, pulse oximetry and re-evaluation of patient's condition.   ____________________________________________   FINAL CLINICAL IMPRESSION(S) / ED DIAGNOSES  Acute renal failure New onset Atrial fibrillation rapid ventricular response Elevated troponin   Harvest Dark, MD 11/24/20 1046    Harvest Dark, MD 11/24/20 1235    Harvest Dark, MD 11/24/20 1242

## 2020-11-24 NOTE — ED Notes (Signed)
Purewick placed on patient.

## 2020-11-24 NOTE — Consult Note (Signed)
[facility]  CONSULT NOTE    Date: 11/24/2020                  Patient Name:  Brooke Hopkins  MRN: [id number]  DOB: 1951-11-05  Age / Sex: 69 y.o., female         PCP: Lynnell Jude, MD                 Service Requesting Consult: Dr. Francine Graven                 Reason for Consult: Acute kidney injury            History of Present Illness: Ms. Brooke Hopkins admitted to [facility] with new onset atrial fibrillation. Patient was hypoglycemic. She was placed on diltiazem and IV metoprolol. She became hypotensive and underwent electrocardioversion. Now she is in sinus bradycardia.   She has chronic lymphedema and has been suffering from chronic lower extremity wounds. She follows with wound care.   Patient was admitted in September with CVA. She is currently bed bound and lives at home. Husband is taking care on her. He is at bedside. Patient has not been eating well and has been vomiting including vomiting up her medications.   Patient found to have bugs crawling on her.    Medications: Outpatient medications: Medications Prior to Admission  Medication Sig Dispense Refill Last Dose  . labetalol (NORMODYNE) 100 MG tablet Take 1 tablet (100 mg total) by mouth 2 (two) times daily. 60 tablet 2 11/24/2020 at Unknown time  . aspirin EC 81 MG EC tablet Take 1 tablet (81 mg total) by mouth daily. Swallow whole. 30 tablet 11   . atorvastatin (LIPITOR) 80 MG tablet Take 1 tablet (80 mg total) by mouth daily at 6 PM. 30 tablet 0   . clopidogrel (PLAVIX) 75 MG tablet Take 75 mg by mouth daily.     . collagenase (SANTYL) ointment Apply topically daily. 15 g 0   . furosemide (LASIX) 20 MG tablet Take 1 tablet (20 mg total) by mouth daily. 30 tablet 11   . hydrALAZINE (APRESOLINE) 25 MG tablet Take 1 tablet (25 mg total) by mouth every 8 (eight) hours. 90 tablet 0   . insulin NPH-regular Human (NOVOLIN 70/30) (70-30) 100 UNIT/ML injection Inject 10 Units into the skin 2 (two)  times daily with a meal. 10 mL 1   . lisinopril (ZESTRIL) 40 MG tablet Take 40 mg by mouth daily.     . metroNIDAZOLE (FLAGYL) 500 MG tablet        Current medications: Current Facility-Administered Medications  Medication Dose Route Frequency Provider Last Rate Last Admin  . acetaminophen (TYLENOL) tablet 650 mg  650 mg Oral Q4H PRN Agbata, Tochukwu, MD      . aspirin EC tablet 81 mg  81 mg Oral Daily Agbata, Tochukwu, MD      . atorvastatin (LIPITOR) tablet 80 mg  80 mg Oral q1800 Agbata, Tochukwu, MD      . clopidogrel (PLAVIX) tablet 75 mg  75 mg Oral Daily Agbata, Tochukwu, MD      . collagenase (SANTYL) ointment   Topical Daily Agbata, Tochukwu, MD      . diltiazem (CARDIZEM) 125 mg in dextrose 5% 125 mL (1 mg/mL) infusion  5-15 mg/hr Intravenous Continuous Harvest Dark, MD   Stopped at 11/24/20 1138  . heparin injection 5,000 Units  5,000 Units Subcutaneous Q8H Agbata, Tochukwu, MD      .  ondansetron (ZOFRAN) injection 4 mg  4 mg Intravenous Q6H PRN Agbata, Tochukwu, MD          Allergies: No Known Allergies    Past Medical History: Past Medical History:  Diagnosis Date  . Chronic kidney disease   . Diabetes mellitus   . Hypertension   . Stroke [facility])      Past Surgical History: Past Surgical History:  Procedure Laterality Date  . ABDOMINAL HYSTERECTOMY    . CHOLECYSTECTOMY    . HAMMER TOE SURGERY    . KNEE ARTHROSCOPY    . LOWER EXTREMITY ANGIOGRAPHY Left 09/01/2020   Procedure: Lower Extremity Angiography;  Surgeon: Katha Cabal, MD;  Location: [place] [facility];  Service: Cardiovascular;  Laterality: Left;  . LUMBAR DISC SURGERY  11/07/11  . LUMBAR WOUND DEBRIDEMENT  11/27/2011   Procedure: LUMBAR WOUND DEBRIDEMENT;  Surgeon: Eustace Moore;  Location: [place] [facility];  Service: Neurosurgery;  Laterality: N/A;  . TONSILLECTOMY       Family History: Family History  Problem Relation Age of Onset  . CAD Mother   . Hypertension Mother   . COPD  Father   . Diabetes Sister   . Hypertension Sister      Social History: Social History   Socioeconomic History  . Marital status: Married    Spouse name: Not on file  . Number of children: Not on file  . Years of education: Not on file  . Highest education level: Not on file  Occupational History  . Not on file  Tobacco Use  . Smoking status: Never Smoker  . Smokeless tobacco: Never Used  Vaping Use  . Vaping Use: Never used  Substance and Sexual Activity  . Alcohol use: No  . Drug use: No  . Sexual activity: Yes    Birth control/protection: Post-menopausal  Other Topics Concern  . Not on file  Social History Narrative  . Not on file   Social Determinants of Health   Financial Resource Strain:   . Difficulty of Paying Living Expenses: Not on file  Food Insecurity:   . Worried About Charity fundraiser in the Last Year: Not on file  . Ran Out of Food in the Last Year: Not on file  Transportation Needs:   . Lack of Transportation (Medical): Not on file  . Lack of Transportation (Non-Medical): Not on file  Physical Activity:   . Days of Exercise per Week: Not on file  . Minutes of Exercise per Session: Not on file  Stress:   . Feeling of Stress : Not on file  Social Connections:   . Frequency of Communication with Friends and Family: Not on file  . Frequency of Social Gatherings with Friends and Family: Not on file  . Attends Religious Services: Not on file  . Active Member of Clubs or Organizations: Not on file  . Attends Archivist Meetings: Not on file  . Marital Status: Not on file  Intimate Partner Violence:   . Fear of Current or Ex-Partner: Not on file  . Emotionally Abused: Not on file  . Physically Abused: Not on file  . Sexually Abused: Not on file     Review of Systems: Review of Systems  Constitutional: Negative.   HENT: Negative.   Eyes: Negative.   Respiratory: Negative for cough, hemoptysis, sputum production, shortness of  breath and wheezing.   Cardiovascular: Negative for chest pain, palpitations, orthopnea, claudication, leg swelling and PND.  Gastrointestinal: Positive  for nausea and vomiting.  Genitourinary: Negative for dysuria, flank pain, frequency, hematuria and urgency.  Musculoskeletal: Negative for back pain, falls, joint pain, myalgias and neck pain.  Skin: Negative.   Neurological: Negative.   Endo/Heme/Allergies: Negative.   Psychiatric/Behavioral: Negative.     Vital Signs: Blood pressure (!) 100/44, pulse (!) 59, temperature 98 F (36.7 C), temperature source Oral, resp. rate 18, height 5\' 7"  (1.702 m), weight 127 kg, SpO2 100 %.  Weight trends: Filed Weights   11/24/20 0931  Weight: 127 kg    Physical Exam: General: Ill appearing, disheveled.   Head: Normocephalic, atraumatic. Dry oral mucosal membranes  Eyes: Anicteric, PERRL  Neck: Supple, trachea midline  Lungs:  Clear to auscultation  Heart: bradycardia  Abdomen:  Soft, nontender, obese  Extremities:  ++ nonpitting peripheral edema and dependent pitting edema.  Neurologic: Nonfocal, moving all four extremities  Skin: No lesions        Lab results: Basic Metabolic Panel: Recent Labs  Lab 11/24/20 0934  NA 141  K 4.4  CL 107  CO2 19*  GLUCOSE 90  BUN 76*  CREATININE 4.57*  CALCIUM 8.0*    Liver Function Tests: Recent Labs  Lab 11/24/20 0934  AST 463*  ALT 401*  ALKPHOS 105  BILITOT 1.3*  PROT 6.3*  ALBUMIN 2.8*   No results for input(s): LIPASE, AMYLASE in the last 168 hours. No results for input(s): AMMONIA in the last 168 hours.  CBC: Recent Labs  Lab 11/24/20 0934  WBC 10.1  HGB 10.0*  HCT 34.2*  MCV 84.4  PLT 277    Cardiac Enzymes: No results for input(s): CKTOTAL, CKMB, CKMBINDEX, TROPONINI in the last 168 hours.  BNP: Invalid input(s): POCBNP  CBG: No results for input(s): GLUCAP in the last 168 hours.  Microbiology: Results for orders placed or performed during the  hospital encounter of 11/24/20  Resp Panel by RT-PCR (Flu A&B, Covid) Nasopharyngeal Swab     Status: None   Collection Time: 11/24/20  9:35 AM   Specimen: Nasopharyngeal Swab; Nasopharyngeal(NP) swabs in vial transport medium  Result Value Ref Range Status   SARS Coronavirus 2 by RT PCR NEGATIVE NEGATIVE Final    Comment: (NOTE) SARS-CoV-2 target nucleic acids are NOT DETECTED.  The SARS-CoV-2 RNA is generally detectable in upper respiratory specimens during the acute phase of infection. The lowest concentration of SARS-CoV-2 viral copies this assay can detect is 138 copies/mL. A negative result does not preclude SARS-Cov-2 infection and should not be used as the sole basis for treatment or other patient management decisions. A negative result may occur with  improper specimen collection/handling, submission of specimen other than nasopharyngeal swab, presence of viral mutation(s) within the areas targeted by this assay, and inadequate number of viral copies(<138 copies/mL). A negative result must be combined with clinical observations, patient history, and epidemiological information. The expected result is Negative.  Fact Sheet for Patients:  EntrepreneurPulse.com.au  Fact Sheet for Healthcare Providers:  IncredibleEmployment.be  This test is no t yet approved or cleared by the [place] FDA and  has been authorized for detection and/or diagnosis of SARS-CoV-2 by FDA under an Emergency Use Authorization (EUA). This EUA will remain  in effect (meaning this test can be used) for the duration of the COVID-19 declaration under Section 564(b)(1) of the Act, 21 U.S.C.section 360bbb-3(b)(1), unless the authorization is terminated  or revoked sooner.       Influenza A by PCR NEGATIVE NEGATIVE Final   Influenza B  by PCR NEGATIVE NEGATIVE Final    Comment: (NOTE) The Xpert Xpress SARS-CoV-2/FLU/RSV plus assay is intended as an aid in the  diagnosis of influenza from Nasopharyngeal swab specimens and should not be used as a sole basis for treatment. Nasal washings and aspirates are unacceptable for Xpert Xpress SARS-CoV-2/FLU/RSV testing.  Fact Sheet for Patients: EntrepreneurPulse.com.au  Fact Sheet for Healthcare Providers: IncredibleEmployment.be  This test is not yet approved or cleared by the [place] FDA and has been authorized for detection and/or diagnosis of SARS-CoV-2 by FDA under an Emergency Use Authorization (EUA). This EUA will remain in effect (meaning this test can be used) for the duration of the COVID-19 declaration under Section 564(b)(1) of the Act, 21 U.S.C. section 360bbb-3(b)(1), unless the authorization is terminated or revoked.  Performed at [facility], [address] [place], [place] [address]     Coagulation Studies: No results for input(s): LABPROT, INR in the last 72 hours.  Urinalysis: No results for input(s): COLORURINE, LABSPEC, PHURINE, GLUCOSEU, HGBUR, BILIRUBINUR, KETONESUR, PROTEINUR, UROBILINOGEN, NITRITE, LEUKOCYTESUR in the last 72 hours.  Invalid input(s): APPERANCEUR    Imaging: DG Chest Portable 1 View  Result Date: 11/24/2020 CLINICAL DATA:  New onset atrial fibrillation.  Shortness of breath. EXAM: PORTABLE CHEST 1 VIEW COMPARISON:  Chest x-ray dated November 11, 2016. FINDINGS: The patient is rotated to the right, limiting evaluation. Stable cardiomediastinal silhouette with heart size at the upper limits of normal. Pulmonary vascular congestion. Possible small right pleural effusion. No consolidation or pneumothorax. No acute osseous abnormality. IMPRESSION: 1. Limited study due to patient rotation. Pulmonary vascular congestion and possible small right pleural effusion. Electronically Signed   By: Titus Dubin M.D.   On: 11/24/2020 09:51      Assessment & Plan: Ms. PHOENIX RIESEN is a 69 y.o. white female  with hypertension, diabetes mellitus type II insulin dependent, peripheral arterial disease, CVA, lymphedema, chronic lower extremity wounds, bed bound who was admitted to [facility] on 11/24/2020 for New onset atrial fibrillation ([place]) [I48.91] Elevated troponin [R77.8] Atrial fibrillation with rapid ventricular response ([place]) [I48.91] Acute renal failure, unspecified acute renal failure type ([place]) [N17.9]  1. Acute renal failure with metabolic acidosis on chronic kidney disease stage IIIA: Baseline creatinine of 1.03, GFR of 55 on 09/13/2020  History of proteinuria, hematuria and glycosuria.  Acute renal failure secondary to poor PO intake, nausea, vomiting, acute cardiorenal syndrome and suspect ATN.  Chronic kidney disease secondary to diabetes and hypertension.  - No acute indication for dialysis - CKD studies - Holding furosemide and lisinopril.  - Encourage PO intake. Due to volume status do not recommend IV fluids at this time.   2. Hypotension: with new onset atrial fibrillation. Required electrocardioversion and IV diltiazem infusion.   3. Anemia with renal failure: hemoglobin 10. Normocytic  4. Diabetes mellitus type II with chronic kidney disease: insulin dependent. Hemoglobin A1c of 5.3% on 09/14/20.    LOS: 0 Shareen Capwell 11/26/20211:31 PM

## 2020-11-24 NOTE — ED Notes (Signed)
Per MD, Cardizem to be stopped at this time d/t hypotension. Attempts to get accurate BP for patient at this time.

## 2020-11-24 NOTE — ED Notes (Signed)
Advised nurse that patient has assigned bed 

## 2020-11-24 NOTE — ED Notes (Signed)
Pt made aware of emergent cardioversion that is needed to be completed.  Pt and family updated on POC and agreeable.

## 2020-11-24 NOTE — Progress Notes (Addendum)
Patient HR non-sustained at 190 then 170 with burst of SVT. CCMD notified RN. Patient asymptomatic. Nurse at bedside. No signs of acute distress. Patient instructed to use call bell. No pain, bedside table, call bell and drink within reach, patient does not wish to be repositioned and does not need to go to the bathroom.   FNP notified of patient HR. RN instructed to page FNP if HR sustains at 120< for greater than 30 minutes. Will continue to monitor patient's HR.

## 2020-11-24 NOTE — Consult Note (Signed)
Cardiology Consultation:   [name] MRN: [id number]; DOB: [birth year]  Admit date: 11/24/2020 Date of Consult: 11/24/2020  Primary Care Provider: Lynnell Jude, MD [facility] Cardiologist: new-Dr. Garen Lah rounding [facility] Electrophysiologist:  None    Patient Profile:   Brooke Hopkins is a 69 y.o. female with a hx of hypertension, CVA, diabetes who is being seen today for the evaluation of atrial fibrillation at the request of Dr. Francine Graven.  History of Present Illness:   Brooke Hopkins is a 69 year old female with history of hypertension, diabetes, CVA, bedbound, PAD s/p stent to L. Popliteal artery and anterior tibial artery, decubitus pressure ulcer, morbid obesity, CKD 3, chronic lymphedema who presents due to low blood sugars.  She was at home and blood sugar check was apparently low in the 50s.  She drank some orange juice, EMS was called, upon arrival blood sugar was in the low 100s.  Has not been feeling well of late, with poor p.o. intake.  In the ED, she was noted to be tachycardic, EKG showing A. fib heart rate is 171.  Started on Cardizem drip but developed hypotension.  She underwent emergent cardioversion initially x50 J unsuccessfully, then successfully with 200 J biphasic.  Currently maintaining sinus rhythm.  Denies any prior episodes of atrial fibrillation.  Does not ambulate much.  Takes aspirin and Plavix due to history of stents, and multiple strokes.   Past Medical History:  Diagnosis Date  . Chronic kidney disease   . Diabetes mellitus   . Hypertension   . Stroke [facility])     Past Surgical History:  Procedure Laterality Date  . ABDOMINAL HYSTERECTOMY    . CHOLECYSTECTOMY    . HAMMER TOE SURGERY    . KNEE ARTHROSCOPY    . LOWER EXTREMITY ANGIOGRAPHY Left 09/01/2020   Procedure: Lower Extremity Angiography;  Surgeon: Katha Cabal, MD;  Location: [place] [facility];  Service: Cardiovascular;  Laterality: Left;  . LUMBAR  DISC SURGERY  11/07/11  . LUMBAR WOUND DEBRIDEMENT  11/27/2011   Procedure: LUMBAR WOUND DEBRIDEMENT;  Surgeon: Eustace Moore;  Location: [place] [facility];  Service: Neurosurgery;  Laterality: N/A;  . TONSILLECTOMY       Home Medications:  Prior to Admission medications   Medication Sig Start Date End Date Taking? Authorizing Provider  labetalol (NORMODYNE) 100 MG tablet Take 1 tablet (100 mg total) by mouth 2 (two) times daily. 09/04/20 11/24/20 Yes Shahmehdi, Valeria Batman, MD  aspirin EC 81 MG EC tablet Take 1 tablet (81 mg total) by mouth daily. Swallow whole. 09/05/20   Shahmehdi, Valeria Batman, MD  atorvastatin (LIPITOR) 80 MG tablet Take 1 tablet (80 mg total) by mouth daily at 6 PM. 10/17/15   Gouru, Aruna, MD  clopidogrel (PLAVIX) 75 MG tablet Take 75 mg by mouth daily. 11/11/20   [provider]  collagenase (SANTYL) ointment Apply topically daily. 09/27/20   Kris Hartmann, NP  furosemide (LASIX) 20 MG tablet Take 1 tablet (20 mg total) by mouth daily. 09/14/20 09/14/21  Annita Brod, MD  hydrALAZINE (APRESOLINE) 25 MG tablet Take 1 tablet (25 mg total) by mouth every 8 (eight) hours. 06/21/17   Nicholes Mango, MD  insulin NPH-regular Human (NOVOLIN 70/30) (70-30) 100 UNIT/ML injection Inject 10 Units into the skin 2 (two) times daily with a meal. 09/14/20   Annita Brod, MD  lisinopril (ZESTRIL) 40 MG tablet Take 40 mg by mouth daily. 10/02/20   [provider]  metroNIDAZOLE (FLAGYL) 500 MG tablet  10/10/20   [provider]    Inpatient Medications: Scheduled Meds: . amiodarone  400 mg Oral BID  . apixaban  5 mg Oral BID  . atorvastatin  80 mg Oral Daily  . [START ON 11/25/2020] clopidogrel  75 mg Oral Daily  . [START ON 11/25/2020] collagenase   Topical Daily   Continuous Infusions: . sodium chloride    . diltiazem (CARDIZEM) infusion Stopped (11/24/20 1138)   PRN Meds: acetaminophen, HYDROcodone-acetaminophen, ondansetron (ZOFRAN) IV  Allergies:   No  Known Allergies  Social History:   Social History   Socioeconomic History  . Marital status: Married    Spouse name: Not on file  . Number of children: Not on file  . Years of education: Not on file  . Highest education level: Not on file  Occupational History  . Not on file  Tobacco Use  . Smoking status: Never Smoker  . Smokeless tobacco: Never Used  Vaping Use  . Vaping Use: Never used  Substance and Sexual Activity  . Alcohol use: No  . Drug use: No  . Sexual activity: Yes    Birth control/protection: Post-menopausal  Other Topics Concern  . Not on file  Social History Narrative  . Not on file   Social Determinants of Health   Financial Resource Strain:   . Difficulty of Paying Living Expenses: Not on file  Food Insecurity:   . Worried About Charity fundraiser in the Last Year: Not on file  . Ran Out of Food in the Last Year: Not on file  Transportation Needs:   . Lack of Transportation (Medical): Not on file  . Lack of Transportation (Non-Medical): Not on file  Physical Activity:   . Days of Exercise per Week: Not on file  . Minutes of Exercise per Session: Not on file  Stress:   . Feeling of Stress : Not on file  Social Connections:   . Frequency of Communication with Friends and Family: Not on file  . Frequency of Social Gatherings with Friends and Family: Not on file  . Attends Religious Services: Not on file  . Active Member of Clubs or Organizations: Not on file  . Attends Archivist Meetings: Not on file  . Marital Status: Not on file  Intimate Partner Violence:   . Fear of Current or Ex-Partner: Not on file  . Emotionally Abused: Not on file  . Physically Abused: Not on file  . Sexually Abused: Not on file    Family History:    Family History  Problem Relation Age of Onset  . CAD Mother   . Hypertension Mother   . COPD Father   . Diabetes Sister   . Hypertension Sister      ROS:  Please see the history of present illness.    All other ROS reviewed and negative.     Physical Exam/Data:   Vitals:   11/24/20 1210 11/24/20 1220 11/24/20 1225 11/24/20 1348  BP: (!) 98/54 (!) 104/59 (!) 100/44 139/78  Pulse:  (!) 40 (!) 59 (!) 58  Resp: 16 17 18 20   Temp:    97.8 F (36.6 C)  TempSrc:    Oral  SpO2:  100% 100% 97%  Weight:      Height:       No intake or output data in the 24 hours ending 11/24/20 1406 Last 3 Weights 11/24/2020 09/27/2020 09/12/2020  Weight (lbs) 280 lb 280 lb  280 lb  Weight (kg) 127.007 kg 127.007 kg 127.007 kg  Some encounter information is confidential and restricted. Go to Review Flowsheets activity to see all data.     Body mass index is 43.85 kg/m.  General: Obese, HEENT: normal Lymph: no adenopathy Neck: Difficult to assess Endocrine:  No thryomegaly Cardiac: Distant heart sounds, regular Lungs: Decreased breath sounds Abd: soft, nontender, distended Ext: Lymphedema present Musculoskeletal: Generalized weakness, appears worse on left Skin: Left wound dressing noted Psych:  Normal affect   EKG:  The EKG was personally reviewed and demonstrates: Initial EKG shows atrial fibrillation with rapid ventricular response, heart rate 171.  Repeat EKG shows sinus rhythm. Telemetry:  Telemetry was personally reviewed and demonstrates: Sinus rhythm, occasional PVCs  Relevant CV Studies: Echo 08/2020 1. Left ventricular ejection fraction, by estimation, is 60 to 65%. The  left ventricle has normal function. The left ventricle has no regional  wall motion abnormalities. Left ventricular diastolic function could not  be evaluated.  2. Right ventricular systolic function is normal. The right ventricular  size is normal.  3. The mitral valve is grossly normal. No evidence of mitral valve  regurgitation.  4. Tricuspid valve regurgitation not assessed.  5. The aortic valve was not well visualized. Aortic valve regurgitation  is not visualized.   Laboratory Data:  High  Sensitivity Troponin:   Recent Labs  Lab 11/24/20 0934 11/24/20 1128  TROPONINIHS 194* 190*     Chemistry Recent Labs  Lab 11/24/20 0934  NA 141  K 4.4  CL 107  CO2 19*  GLUCOSE 90  BUN 76*  CREATININE 4.57*  CALCIUM 8.0*  GFRNONAA 10*  ANIONGAP 15    Recent Labs  Lab 11/24/20 0934  PROT 6.3*  ALBUMIN 2.8*  AST 463*  ALT 401*  ALKPHOS 105  BILITOT 1.3*   Hematology Recent Labs  Lab 11/24/20 0934  WBC 10.1  RBC 4.05  HGB 10.0*  HCT 34.2*  MCV 84.4  MCH 24.7*  MCHC 29.2*  RDW 16.8*  PLT 277   BNPNo results for input(s): BNP, PROBNP in the last 168 hours.  DDimer No results for input(s): DDIMER in the last 168 hours.   Radiology/Studies:  DG Chest Portable 1 View  Result Date: 11/24/2020 CLINICAL DATA:  New onset atrial fibrillation.  Shortness of breath. EXAM: PORTABLE CHEST 1 VIEW COMPARISON:  Chest x-ray dated November 11, 2016. FINDINGS: The patient is rotated to the right, limiting evaluation. Stable cardiomediastinal silhouette with heart size at the upper limits of normal. Pulmonary vascular congestion. Possible small right pleural effusion. No consolidation or pneumothorax. No acute osseous abnormality. IMPRESSION: 1. Limited study due to patient rotation. Pulmonary vascular congestion and possible small right pleural effusion. Electronically Signed   By: Titus Dubin M.D.   On: 11/24/2020 09:51     Assessment and Plan:   1.  New onset A. fib RVR -Status post emergent DC cardioversion maintaining sinus rhythm -CHA2DS2-VASc score 7 (htn, dm, age, gender, vasc, cva) -Start amiodarone 400 mg twice daily x1 week, decrease to 200 mg twice daily after. -Eliquis 5 mg twice daily -Recent echo with preserved ejection fraction  2.  Hypertension -BP in ED was low.  Currently improved to systolic of 161W/960A -Amiodarone as above -Restart PTA hypertension meds as blood pressure permits  3.  CVA -Lipitor, Plavix, Eliquis -Holding aspirin with  starting Eliquis  4.  PAD status post peripheral stent -Lipitor, Plavix  5.  CKD -Acute on chronic renal dysfunction  -  nephrology following  Total encounter time 110 minutes  Greater than 50% was spent in counseling and coordination of care with the patient   Signed, Kate Sable, MD  11/24/2020 2:06 PM

## 2020-11-24 NOTE — ED Notes (Signed)
Date and time results received: 11/24/20 1021  Test: troponin  Critical Value: 194  Name of Provider Notified: Kerman Passey, MD  Orders Received? Or Actions Taken?: Orders Received - See Orders for details

## 2020-11-24 NOTE — Progress Notes (Addendum)
Upon admitting patient, she had a small bug crawl out from underneath her dressings on her legs. We were able to secure the bug and [facility]S came to idenitfy the bug. They are saying it is a small/baby roach but are going to send it for testing to make sure. We were told to double bag patients personal belongings and send them home with husband. She does not need to be on contact precautions. We put in a consult for transition team to help with this and wound care consults for her multiple wounds.  MD aware.

## 2020-11-24 NOTE — ED Notes (Signed)
Admission MD at bedside.  

## 2020-11-24 NOTE — ED Notes (Signed)
BVM applied to patient to assist with airway post etomidate.

## 2020-11-24 NOTE — ED Notes (Signed)
Cardioversion completed at 100J.

## 2020-11-24 NOTE — ED Triage Notes (Signed)
Pt comes into the ED via [facility] from home c/o new onset a-fib ranging 140-210.  Pt denies any fluttering feelings, but has increased lethargy.  Pt initial CBG with her home health RN was in the 50's, EMS got CBG 116.  Pt is A&Ox4, stable and in NAD with even and unlabored respirations.  Pt given 5mg  total of lopressor with no change.

## 2020-11-24 NOTE — ED Notes (Signed)
ED Provider at bedside. 

## 2020-11-25 ENCOUNTER — Inpatient Hospital Stay: Payer: Medicare HMO

## 2020-11-25 ENCOUNTER — Inpatient Hospital Stay (HOSPITAL_COMMUNITY)
Admit: 2020-11-25 | Discharge: 2020-11-25 | Disposition: A | Discharge status: Home / Self Care | Payer: Medicare HMO | Attending: Nurse Practitioner | Admitting: Nurse Practitioner

## 2020-11-25 DIAGNOSIS — I4891 Unspecified atrial fibrillation: Secondary | ICD-10-CM

## 2020-11-25 DIAGNOSIS — R7401 Elevation of levels of liver transaminase levels: Secondary | ICD-10-CM

## 2020-11-25 DIAGNOSIS — I361 Nonrheumatic tricuspid (valve) insufficiency: Secondary | ICD-10-CM | POA: Diagnosis not present

## 2020-11-25 DIAGNOSIS — I83023 Varicose veins of left lower extremity with ulcer of ankle: Secondary | ICD-10-CM

## 2020-11-25 DIAGNOSIS — R778 Other specified abnormalities of plasma proteins: Secondary | ICD-10-CM | POA: Diagnosis not present

## 2020-11-25 DIAGNOSIS — N179 Acute kidney failure, unspecified: Secondary | ICD-10-CM

## 2020-11-25 DIAGNOSIS — Z6841 Body Mass Index (BMI) 40.0 and over, adult: Secondary | ICD-10-CM

## 2020-11-25 DIAGNOSIS — L97329 Non-pressure chronic ulcer of left ankle with unspecified severity: Secondary | ICD-10-CM

## 2020-11-25 LAB — ECHOCARDIOGRAM COMPLETE
AR max vel: 0.88 cm2
AV Peak grad: 8.8 mmHg
Ao pk vel: 1.48 m/s
Area-P 1/2: 3.12 cm2
Height: 67 in
S' Lateral: 3.29 cm
Weight: 4350.4 oz

## 2020-11-25 LAB — CBC
HCT: 30.8 % — ABNORMAL LOW (ref 36.0–46.0)
Hemoglobin: 9.2 g/dL — ABNORMAL LOW (ref 12.0–15.0)
MCH: 24.9 pg — ABNORMAL LOW (ref 26.0–34.0)
MCHC: 29.9 g/dL — ABNORMAL LOW (ref 30.0–36.0)
MCV: 83.5 fL (ref 80.0–100.0)
Platelets: 230 10*3/uL (ref 150–400)
RBC: 3.69 MIL/uL — ABNORMAL LOW (ref 3.87–5.11)
RDW: 16.9 % — ABNORMAL HIGH (ref 11.5–15.5)
WBC: 9 10*3/uL (ref 4.0–10.5)
nRBC: 0 % (ref 0.0–0.2)

## 2020-11-25 LAB — HEPATITIS B CORE ANTIBODY, IGM: Hep B C IgM: NONREACTIVE

## 2020-11-25 LAB — GLUCOSE, CAPILLARY: Glucose-Capillary: 72 mg/dL (ref 70–99)

## 2020-11-25 LAB — BASIC METABOLIC PANEL
Anion gap: 13 (ref 5–15)
BUN: 79 mg/dL — ABNORMAL HIGH (ref 8–23)
CO2: 19 mmol/L — ABNORMAL LOW (ref 22–32)
Calcium: 7.9 mg/dL — ABNORMAL LOW (ref 8.9–10.3)
Chloride: 108 mmol/L (ref 98–111)
Creatinine, Ser: 4.88 mg/dL — ABNORMAL HIGH (ref 0.44–1.00)
GFR, Estimated: 9 mL/min — ABNORMAL LOW (ref >60)
Glucose, Bld: 89 mg/dL (ref 70–99)
Potassium: 4.2 mmol/L (ref 3.5–5.1)
Sodium: 140 mmol/L (ref 135–145)

## 2020-11-25 LAB — MAGNESIUM: Magnesium: 1.8 mg/dL (ref 1.7–2.4)

## 2020-11-25 LAB — HEPATITIS C ANTIBODY: HCV Ab: NONREACTIVE

## 2020-11-25 LAB — TSH: TSH: 1.854 u[IU]/mL (ref 0.350–4.500)

## 2020-11-25 MED ORDER — AMIODARONE HCL IN DEXTROSE 360-4.14 MG/200ML-% IV SOLN
60.0000 mg/h | INTRAVENOUS | Status: DC
  Administered 2020-11-25 – 2020-11-26 (×2): 30 mg/h via INTRAVENOUS
  Administered 2020-11-26 – 2020-11-28 (×9): 60 mg/h via INTRAVENOUS
  Filled 2020-11-25 (×3): qty 200
  Filled 2020-11-25: qty 2000
  Filled 2020-11-25 (×4): qty 200

## 2020-11-25 MED ORDER — AMIODARONE HCL IN DEXTROSE 360-4.14 MG/200ML-% IV SOLN
60.0000 mg/h | INTRAVENOUS | Status: DC
  Administered 2020-11-25 (×2): 60 mg/h via INTRAVENOUS
  Filled 2020-11-25 (×2): qty 200

## 2020-11-25 MED ORDER — SODIUM BICARBONATE 8.4 % IV SOLN
INTRAVENOUS | Status: AC
  Filled 2020-11-25: qty 150
  Filled 2020-11-25 (×2): qty 850

## 2020-11-25 MED ORDER — AMIODARONE LOAD VIA INFUSION
150.0000 mg | Freq: Once | INTRAVENOUS | Status: AC
  Administered 2020-11-25: 150 mg via INTRAVENOUS
  Filled 2020-11-25: qty 83.34

## 2020-11-25 NOTE — Progress Notes (Addendum)
Progress Note  Patient Name: Brooke Hopkins Date of Encounter: 11/25/2020  Primary Cardiologist: Kate Sable, MD  Subjective   Feels well this AM.  No c/p or dyspnea.  Maintaining sinus rhythm.  Still a little nauseated but much improved since admission.  Inpatient Medications    Scheduled Meds: . amiodarone  400 mg Oral BID  . apixaban  5 mg Oral BID  . clopidogrel  75 mg Oral Daily  . collagenase   Topical Daily   Continuous Infusions: . sodium chloride    . diltiazem (CARDIZEM) infusion Stopped (11/24/20 1138)   PRN Meds: acetaminophen, HYDROcodone-acetaminophen, ondansetron (ZOFRAN) IV   Vital Signs    Vitals:   11/24/20 2014 11/24/20 2017 11/25/20 0219 11/25/20 0418  BP: (!) 120/53 (!) 120/53  (!) 119/56  Pulse: (!) 59 61  61  Resp: 16     Temp: 97.8 F (36.6 C) 97.8 F (36.6 C)  (!) 97.5 F (36.4 C)  TempSrc: Oral Oral  Oral  SpO2: 100%   100%  Weight:   123.3 kg   Height:        Intake/Output Summary (Last 24 hours) at 11/25/2020 0744 Last data filed at 11/25/2020 0500 Gross per 24 hour  Intake 720 ml  Output 0 ml  Net 720 ml   Filed Weights   11/24/20 0931 11/25/20 0219  Weight: 127 kg 123.3 kg    Physical Exam   GEN: Obese, in no acute distress.  HEENT: Grossly normal.  Neck: Supple, no JVD, carotid bruits, or masses. Cardiac: RRR, no murmurs, rubs, or gallops. No clubbing, cyanosis. Bilat LE edema - 2-3 +.  Left lower leg wrapped.  Radials 2+, DP/PT 1+ and equal bilaterally.  Respiratory:  Respirations regular and unlabored, clear to auscultation bilaterally. GI: Obese, soft, nontender, nondistended, BS + x 4. MS: no deformity or atrophy. Skin: warm and dry, no rash. Neuro:  Weak - needs assistance for repositioning. Psych: AAOx3.  Normal affect.  Labs    Chemistry Recent Labs  Lab 11/24/20 0934  NA 141  K 4.4  CL 107  CO2 19*  GLUCOSE 90  BUN 76*  CREATININE 4.57*  CALCIUM 8.0*  PROT 6.3*  ALBUMIN 2.8*  AST  463*  ALT 401*  ALKPHOS 105  BILITOT 1.3*  GFRNONAA 10*  ANIONGAP 15     Hematology Recent Labs  Lab 11/24/20 0934  WBC 10.1  RBC 4.05  HGB 10.0*  HCT 34.2*  MCV 84.4  MCH 24.7*  MCHC 29.2*  RDW 16.8*  PLT 277    Cardiac Enzymes  Recent Labs  Lab 11/24/20 0934 11/24/20 1128 11/24/20 1338 11/24/20 1528  TROPONINIHS 194* 190* 205* 212*     Lipids  Lab Results  Component Value Date   CHOL 112 09/14/2020   HDL 31 (L) 09/14/2020   LDLCALC 62 09/14/2020   TRIG 93 09/14/2020   CHOLHDL 3.6 09/14/2020    HbA1c  Lab Results  Component Value Date   HGBA1C 5.3 09/14/2020    Radiology    US Abdomen Complete  Result Date: 11/24/2020 CLINICAL DATA:  Transaminitis. EXAM: ABDOMEN ULTRASOUND COMPLETE COMPARISON:  None. FINDINGS: Gallbladder: Surgically removed. Common bile duct: Diameter: 0.4 cm Liver: No focal lesion identified. Echogenicity is within normal limits. Liver contour is mildly nodular. Portal vein is patent on color Doppler imaging with normal direction of blood flow towards the liver. IVC: No abnormality visualized. Pancreas: Visualized portion unremarkable. Spleen: Size and appearance within normal limits. Right Kidney:  Length: 9.3 cm. Echogenicity within normal limits. No mass or hydronephrosis visualized. Left Kidney: Length: 9.7 cm. Echogenicity within normal limits. No mass or hydronephrosis visualized. Abdominal aorta: Mid and distal abdominal aorta cannot be visualized due to bowel gas. Other findings: Right pleural effusion. IMPRESSION: 1. Liver contour is mildly nodular and concerning for cirrhosis. No biliary dilatation. Cholecystectomy. 2. Right pleural effusion. Electronically Signed   By: Markus Daft M.D.   On: 11/24/2020 15:25   DG Chest Portable 1 View  Result Date: 11/24/2020 CLINICAL DATA:  New onset atrial fibrillation.  Shortness of breath. EXAM: PORTABLE CHEST 1 VIEW COMPARISON:  Chest x-ray dated November 11, 2016. FINDINGS: The patient is  rotated to the right, limiting evaluation. Stable cardiomediastinal silhouette with heart size at the upper limits of normal. Pulmonary vascular congestion. Possible small right pleural effusion. No consolidation or pneumothorax. No acute osseous abnormality. IMPRESSION: 1. Limited study due to patient rotation. Pulmonary vascular congestion and possible small right pleural effusion. Electronically Signed   By: Titus Dubin M.D.   On: 11/24/2020 09:51    Telemetry    RSR w/ brief runs of SVT/atrial tach, 8 beat run of NSVT, occas PVCs/couplets - Personally Reviewed  Cardiac Studies   Echo 08/2020 1. Left ventricular ejection fraction, by estimation, is 60 to 65%. The  left ventricle has normal function. The left ventricle has no regional  wall motion abnormalities. Left ventricular diastolic function could not  be evaluated.   2. Right ventricular systolic function is normal. The right ventricular  size is normal.   3. The mitral valve is grossly normal. No evidence of mitral valve  regurgitation.   4. Tricuspid valve regurgitation not assessed.   5. The aortic valve was not well visualized. Aortic valve regurgitation  is not visualized.  _____________    Patient Profile     69 y.o. female w/ a h/o HTN, DM, CVA, PAD s/ L poplitetal and AT stenting, obesity, CKD III, chronic lymphedema, and decubitus ulcers, who presented 11/26 w/ hypoglycemia, Afib w/ RVR, and hypotension req emergent DCCV.  Assessment & Plan    1.  Afib RVR:  Presented w/ hypoglycemia, hypotension, Afib RVR, AKI, and shock liver in the setting of a three day h/o persistent nausea, vomiting, and poor PO intake.  S/p DCCV in ED 2/2 hypotension.  Maintaining sinus on PO amio w/ brief runs of SVT/Atrial tach and an 8 beat run of NSVT - all asymptomatic.  RN notes that 2 doses of amio held last night due to relative bradycardia.  Resume this AM.  CHA2DS2VASc = 7  Eliquis 5 bid started 11/26.  F/u echo given mild HsTrop  elevation.  F/u TSH.  Watch LFTs closely given shock liver and amio initiation.  F/u ECG this AM to reassess QTc on amio.  As prev noted, if tolerated, will plan amio 400 bid x 1 wk then 200 bid, with eventual goal to reduce to 200mg  daily.  2.  Elevated HsTrop/Demand Ischemia:  In setting of rapid afib/AKI. Mild trop elev w/ flat trend.  Peak 212 currently.  Denies c/p currently and prior to admission. With PAD hx, highly likely that she also has CAD.  Prior echo w/ nl EF in Sept.  Repeat to assess for new wma's.  With AKI and shock liver, she is not a candidate for invasive eval @ this time.  Will consider outpt myoview following recovery, though given comorbidities and bed bound state, a more conservative approach is likely appropriate.  No ASA in setting of eliquis. No statin for now in setting of tramsaminitis.  Hold off on  blocker for now 2/2 hypotension on admission (was on labetalol @ home).  3.  AKI on CKD III: Creat 4.57 on arrival in setting of hypotension/dehydration.  F/u labs pending this AM.  Lasix/acei on hold.  S/p IVF.  Watch volume closely.  4.  Essential HTN/hypotension: Hypotensive on arrival in setting of Afib RVR, poor PO intake and AKI.  On lasix, hydralazine, labetalol, and lisinopril @ home  all on hold.  Pressures stable this AM.  5.  PAD:  S/p prior L pop/AT stenting.  Followed by vasc surgery. Cont plavix.  No asa given eliquis initiation.  Statin on hold in setting of presumed shock liver.  6.  H/o CVAs:  Was on asa/plavix/statin @ home.  Asa d/c'd in setting of eliquis initiation.  7.  HL:  LDL 62 in Sept.  Lipitor on hold in setting of abnl lft's.  8.  Transaminitis:  Likely 2/2 shock liver in setting of tachycardia/hypotension.  Statin on hold.  Follow lft's closely, esp w/ initiation of amio.  Signed, Murray Hodgkins, NP  11/25/2020, 7:44 AM    For questions or updates, please contact   Please consult [email or web address] for contact info under Cardiology/STEMI.

## 2020-11-25 NOTE — Progress Notes (Signed)
    S:    CTSP by RN due to sudden onset of tachycardia. Appears relatively regular on tele but 12 lead confirms recurrent Afib RVR. Pt asymptomatic.  O:   Vitals:   11/25/20 0418 11/25/20 0749  BP: (!) 119/56 (!) 116/49  Pulse: 61 62  Resp:  19  Temp: (!) 97.5 F (36.4 C) 98.1 F (36.7 C)  SpO2: 100% 99%   Pleasant, NAD.  AAOx3. Neuro grossly nl, CN II-XII grossly intact.  Cor Irreg, tachy, Lungs CTA.  ECG: 11/27 @ 09:26: Afib, RVR, 180, poor R progression  A/P:  1.  Afib RVR:  Pt developed recurrent afib.  Amio ordered yesterday but not given due to relatively bradycardia. She did receive PO dose this AM.  Will initiate amio gtt w/ 150 mg bolus.  Currently, pt asymptomatic.  Will have to watch BP closely as she was hypotensive on arrival.  Low threshold to pursue repeat DCCV in necessary.  Murray Hodgkins, NP

## 2020-11-25 NOTE — Progress Notes (Signed)
*  PRELIMINARY RESULTS* Echocardiogram  has been performed.  Brooke Hopkins 11/25/2020, 2:18 PM

## 2020-11-25 NOTE — Progress Notes (Signed)
[facility]  ROUNDING NOTE   Subjective:   Husband at bedside. States patient is easily falling asleep. Patient states her nausea has improved but she has not touched her breakfast tray.  Creatinine 4.88 (4.57)  Patient states she does not think she has urinated since admission.   Objective:  Vital signs in last 24 hours:  Temp:  [97.5 F (36.4 C)-98.1 F (36.7 C)] 98.1 F (36.7 C) (11/27 0749) Pulse Rate:  [31-169] 62 (11/27 0749) Resp:  [12-24] 19 (11/27 0749) BP: (77-139)/(44-100) 116/49 (11/27 0749) SpO2:  [96 %-100 %] 99 % (11/27 0749) Weight:  [123.3 kg-127 kg] 123.3 kg (11/27 0219)  Weight change:  Filed Weights   11/24/20 0931 11/25/20 0219  Weight: 127 kg 123.3 kg    Intake/Output: I/O last 3 completed shifts: In: 720 [P.O.:720] Out: 0    Intake/Output this shift:  No intake/output data recorded.  Physical Exam: General: NAD, laying in bed  Head: Normocephalic, atraumatic. Moist oral mucosal membranes  Eyes: Anicteric, PERRL  Neck: Supple, trachea midline  Lungs:  Clear to auscultation  Heart: Regular rate and rhythm  Abdomen:  Soft, nontender, obese  Extremities: + peripheral edema. Left leg in dressings  Neurologic: Nonfocal, moving all four extremities  Skin: No lesions        Basic Metabolic Panel: Recent Labs  Lab 11/24/20 0934 11/25/20 0723  NA 141 140  K 4.4 4.2  CL 107 108  CO2 19* 19*  GLUCOSE 90 89  BUN 76* 79*  CREATININE 4.57* 4.88*  CALCIUM 8.0* 7.9*  MG  --  1.8    Liver Function Tests: Recent Labs  Lab 11/24/20 0934  AST 463*  ALT 401*  ALKPHOS 105  BILITOT 1.3*  PROT 6.3*  ALBUMIN 2.8*   No results for input(s): LIPASE, AMYLASE in the last 168 hours. No results for input(s): AMMONIA in the last 168 hours.  CBC: Recent Labs  Lab 11/24/20 0934 11/25/20 0723  WBC 10.1 9.0  HGB 10.0* 9.2*  HCT 34.2* 30.8*  MCV 84.4 83.5  PLT 277 230    Cardiac Enzymes: No results for input(s): CKTOTAL,  CKMB, CKMBINDEX, TROPONINI in the last 168 hours.  BNP: Invalid input(s): POCBNP  CBG: Recent Labs  Lab 11/25/20 0417  GLUCAP 72    Microbiology: Results for orders placed or performed during the hospital encounter of 11/24/20  Resp Panel by RT-PCR (Flu A&B, Covid) Nasopharyngeal Swab     Status: None   Collection Time: 11/24/20  9:35 AM   Specimen: Nasopharyngeal Swab; Nasopharyngeal(NP) swabs in vial transport medium  Result Value Ref Range Status   SARS Coronavirus 2 by RT PCR NEGATIVE NEGATIVE Final    Comment: (NOTE) SARS-CoV-2 target nucleic acids are NOT DETECTED.  The SARS-CoV-2 RNA is generally detectable in upper respiratory specimens during the acute phase of infection. The lowest concentration of SARS-CoV-2 viral copies this assay can detect is 138 copies/mL. A negative result does not preclude SARS-Cov-2 infection and should not be used as the sole basis for treatment or other patient management decisions. A negative result may occur with  improper specimen collection/handling, submission of specimen other than nasopharyngeal swab, presence of viral mutation(s) within the areas targeted by this assay, and inadequate number of viral copies(<138 copies/mL). A negative result must be combined with clinical observations, patient history, and epidemiological information. The expected result is Negative.  Fact Sheet for Patients:  EntrepreneurPulse.com.au  Fact Sheet for Healthcare Providers:  IncredibleEmployment.be  This test is  no t yet approved or cleared by the [place] and  has been authorized for detection and/or diagnosis of SARS-CoV-2 by FDA under an Emergency Use Authorization (EUA). This EUA will remain  in effect (meaning this test can be used) for the duration of the COVID-19 declaration under Section 564(b)(1) of the Act, 21 U.S.C.section 360bbb-3(b)(1), unless the authorization is terminated  or revoked  sooner.       Influenza A by PCR NEGATIVE NEGATIVE Final   Influenza B by PCR NEGATIVE NEGATIVE Final    Comment: (NOTE) The Xpert Xpress SARS-CoV-2/FLU/RSV plus assay is intended as an aid in the diagnosis of influenza from Nasopharyngeal swab specimens and should not be used as a sole basis for treatment. Nasal washings and aspirates are unacceptable for Xpert Xpress SARS-CoV-2/FLU/RSV testing.  Fact Sheet for Patients: EntrepreneurPulse.com.au  Fact Sheet for Healthcare Providers: IncredibleEmployment.be  This test is not yet approved or cleared by the [place] FDA and has been authorized for detection and/or diagnosis of SARS-CoV-2 by FDA under an Emergency Use Authorization (EUA). This EUA will remain in effect (meaning this test can be used) for the duration of the COVID-19 declaration under Section 564(b)(1) of the Act, 21 U.S.C. section 360bbb-3(b)(1), unless the authorization is terminated or revoked.  Performed at [facility], [address] [place], [name] [address]     Coagulation Studies: No results for input(s): LABPROT, INR in the last 72 hours.  Urinalysis: No results for input(s): COLORURINE, LABSPEC, PHURINE, GLUCOSEU, HGBUR, BILIRUBINUR, KETONESUR, PROTEINUR, UROBILINOGEN, NITRITE, LEUKOCYTESUR in the last 72 hours.  Invalid input(s): APPERANCEUR    Imaging: US Abdomen Complete  Result Date: 11/24/2020 CLINICAL DATA:  Transaminitis. EXAM: ABDOMEN ULTRASOUND COMPLETE COMPARISON:  None. FINDINGS: Gallbladder: Surgically removed. Common bile duct: Diameter: 0.4 cm Liver: No focal lesion identified. Echogenicity is within normal limits. Liver contour is mildly nodular. Portal vein is patent on color Doppler imaging with normal direction of blood flow towards the liver. IVC: No abnormality visualized. Pancreas: Visualized portion unremarkable. Spleen: Size and appearance within normal limits. Right Kidney:  Length: 9.3 cm. Echogenicity within normal limits. No mass or hydronephrosis visualized. Left Kidney: Length: 9.7 cm. Echogenicity within normal limits. No mass or hydronephrosis visualized. Abdominal aorta: Mid and distal abdominal aorta cannot be visualized due to bowel gas. Other findings: Right pleural effusion. IMPRESSION: 1. Liver contour is mildly nodular and concerning for cirrhosis. No biliary dilatation. Cholecystectomy. 2. Right pleural effusion. Electronically Signed   By: Markus Daft M.D.   On: 11/24/2020 15:25   DG Chest Portable 1 View  Result Date: 11/24/2020 CLINICAL DATA:  New onset atrial fibrillation.  Shortness of breath. EXAM: PORTABLE CHEST 1 VIEW COMPARISON:  Chest x-ray dated November 11, 2016. FINDINGS: The patient is rotated to the right, limiting evaluation. Stable cardiomediastinal silhouette with heart size at the upper limits of normal. Pulmonary vascular congestion. Possible small right pleural effusion. No consolidation or pneumothorax. No acute osseous abnormality. IMPRESSION: 1. Limited study due to patient rotation. Pulmonary vascular congestion and possible small right pleural effusion. Electronically Signed   By: Titus Dubin M.D.   On: 11/24/2020 09:51     Medications:   . sodium chloride    . diltiazem (CARDIZEM) infusion Stopped (11/24/20 1138)  . sodium bicarbonate (isotonic) 150 mEq in D5W 1000 mL infusion     . amiodarone  400 mg Oral BID  . apixaban  5 mg Oral BID  . clopidogrel  75 mg Oral Daily  .  collagenase   Topical Daily   acetaminophen, HYDROcodone-acetaminophen, ondansetron (ZOFRAN) IV  Assessment/ Plan:  Brooke Hopkins is a 69 y.o. white female with hypertension, diabetes mellitus type II insulin dependent, peripheral arterial disease, CVA, lymphedema, chronic lower extremity wounds, bed bound who was admitted to [facility] on 11/24/2020 for New onset atrial fibrillation ([place]) [I48.91] Elevated troponin [R77.8] Atrial fibrillation with  rapid ventricular response ([place]) [I48.91] Acute renal failure, unspecified acute renal failure type ([place]) [N17.9]  1. Acute renal failure with metabolic acidosis on chronic kidney disease stage IIIA: Baseline creatinine of 1.03, GFR of 55 on 09/13/2020  History of proteinuria, hematuria and glycosuria.  Acute renal failure secondary to poor PO intake, nausea, vomiting, acute cardiorenal syndrome and suspect ATN.  Chronic kidney disease secondary to diabetes and hypertension.  - No acute indication for dialysis - Holding furosemide and lisinopril.  - Encourage PO intake. - start IV fluids: sodium bicarbonate at 21mL/hr  2. Hypotension: with new onset atrial fibrillation. Required electrocardioversion and IV diltiazem infusion.   3. Anemia with renal failure: hemoglobin 9.2. Normocytic  4. Diabetes mellitus type II with chronic kidney disease: insulin dependent. Hemoglobin A1c of 5.3% on 09/14/20.     LOS: 1 Myrah Strawderman 11/27/20219:14 AM

## 2020-11-25 NOTE — Progress Notes (Addendum)
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 Florentina Addison and had 13 X 6 cm wound debrided 11/15/2020  Came to ED because of low blood sugar in the 50s-endorsed multiple areas of nausea vomiting at home noted to be tachycardic in A. fib given metoprolol IV x2 with no improvement started Cardizem gtt. developed hypotension and then was cardioverted in the ED for unstable atrial fibrillation Cardiology was consulted  Also found to have AKI on admission baseline creatinine 1.0-->76/4.5 with CO2 19 anion gap 15 and nephrology consult  Because of hypotension Cardizem and beta-blockers have significantly improved Amiodarone gtt. 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 New onset atrial fibrillation CHADS2 score >7 now on Eliquis a. Hypotensive on nodal agents-currently include Cardizem gtt. saline 100 cc an hour-defer management to cardiology currently on amiodarone gtt. as no other good options b.  Eliquis started this admission-aspirin Plavix discontinued 2. Left-sided  weakness (new) in the setting of prior CVA 01/2015 08/2019 a. Claiming to have increasing weakness and paresthesias on the left side b. Given to prior strokes reasonable to rule out separate other stroke work-up noncontrast CT head when next available c. Already on Eliquis would hold given risk of bleeding aspirin and Plavix at least 3. AKI? Obstruction versus secondary to volume depletion from prior to admission nausea vomiting a. Bladder scan is pending-if above 100 and will need to be placed on a Foley catheter b. This admission discontinue lisinopril 40 c. Nephrology started sodium bicarb 150 mEq at 50 cc/H d. Follow-up SPEP, light chains, further work-up as per nephrologist-unclear if we will need biopsy 4. PVD status post multiple stents left side a. Plavix aspirin now on hold given need for Eliquis reevaluate as an outpatient may be able to resume aspirin 5. Hypoglycemia on admission a. CBGs 70-90, not on any sliding scale coverage b. Hold NPH 7030 10 twice daily for now 6. History of hypertension with hypotension currently a. Holding Lasix 20 lisinopril 40 hydralazine 25 every 8 labetalol 100 twice daily clonidine patch b. Reevaluate need for antihypertensives clonidine for 7.  cirrhosis of the liver a. Possibly secondary to fatty liver versus hypotension and heart failure b. Repeat trends in a.m. 11/28 and check INR c. Hepatitis panels etc. are pending 8. chronic sacral and lower extremity wounds followed by Dr. Asa Saunas 9. Total assist at baseline  DVT prophylaxis: Eliquis Code Status: DNR verbalized prior patient at bedside Family Communication: Discussed with husband in detail Disposition:  Status is: Inpatient  Remains inpatient appropriate because:Persistent severe electrolyte disturbances, Ongoing diagnostic testing needed not appropriate for  outpatient work up, Unsafe d/c plan and Inpatient level of care appropriate due to severity of illness   Dispo: The patient is  from: Home              Anticipated d/c is to: Home              Anticipated d/c date is: > 3 days              Patient currently is not medically stable to d/c.       Consultants:   Cardiology  Procedures: Cardioversion 11/26 Echocardiogram  Antimicrobials: None   Subjective: Awake alert states her left side with a little bit tingly and her husband reports some incoordinate movements on that side- No chest pain no fever does not feel dizzy no nausea vomiting  Objective: Vitals:   11/25/20 1102 11/25/20 1117 11/25/20 1132 11/25/20 1141  BP: 107/67 99/60 91/61  (!) 109/59  Pulse: (!) 124 93 (!) 149 (!) 128  Resp:  17    Temp:  97.8 F (36.6 C)    TempSrc:  Oral    SpO2: 98% 96% 96% 100%  Weight:      Height:        Intake/Output Summary (Last 24 hours) at 11/25/2020 1322 Last data filed at 11/25/2020 1007 Gross per 24 hour  Intake 720 ml  Output 0 ml  Net 720 ml   Filed Weights   11/24/20 0931 11/25/20 0219  Weight: 127 kg 123.3 kg    Examination:  EOMI NCAT obese flat affect neck soft supple Chest clinically clear no added sound no rales no rhonchi S1-S2 no murmur no rub no gallop irregularly irregular rate in the 150s on monitors Abdomen soft nontender no rebound no guarding Finger-nose-finger test on both left and right side seem to be intact power is 5/5 smile is symmetric do not appreciate any focal findings reflexes are slightly brisk in brachioradialis and  biceps brachii And I did not review those wounds today  Data Reviewed: I have personally reviewed following labs and imaging studies BUNs/creatinine 76/4.5-->79/4.8 CO2 19 Anion gap 13  Hemoglobin 10.0-->9.2 WBC 9.0 Platelet 230  Radiology Studies: US Abdomen Complete  Result Date: 11/24/2020 CLINICAL DATA:  Transaminitis. EXAM: ABDOMEN ULTRASOUND COMPLETE COMPARISON:  None. FINDINGS: Gallbladder: Surgically removed. Common bile duct: Diameter: 0.4 cm Liver: No focal lesion identified.  Echogenicity is within normal limits. Liver contour is mildly nodular. Portal vein is patent on color Doppler imaging with normal direction of blood flow towards the liver. IVC: No abnormality visualized. Pancreas: Visualized portion unremarkable. Spleen: Size and appearance within normal limits. Right Kidney: Length: 9.3 cm. Echogenicity within normal limits. No mass or hydronephrosis visualized. Left Kidney: Length: 9.7 cm. Echogenicity within normal limits. No mass or hydronephrosis visualized. Abdominal aorta: Mid and distal abdominal aorta cannot be visualized due to bowel gas. Other findings: Right pleural effusion. IMPRESSION: 1. Liver contour is mildly nodular and concerning for cirrhosis. No biliary dilatation. Cholecystectomy. 2. Right pleural effusion. Electronically Signed   By: Markus Daft M.D.   On: 11/24/2020 15:25   DG Chest Portable 1 View  Result Date: 11/24/2020 CLINICAL DATA:  New onset atrial fibrillation.  Shortness of breath. EXAM: PORTABLE CHEST 1 VIEW COMPARISON:  Chest x-ray dated November 11, 2016. FINDINGS: The patient is rotated to the right, limiting evaluation. Stable cardiomediastinal silhouette with heart size at the upper limits of normal. Pulmonary vascular congestion. Possible small right pleural effusion. No consolidation or pneumothorax. No acute osseous  abnormality. IMPRESSION: 1. Limited study due to patient rotation. Pulmonary vascular congestion and possible small right pleural effusion. Electronically Signed   By: Titus Dubin M.D.   On: 11/24/2020 09:51     Scheduled Meds: . apixaban  5 mg Oral BID  . clopidogrel  75 mg Oral Daily  . collagenase   Topical Daily   Continuous Infusions: . sodium chloride    . amiodarone 60 mg/hr (11/25/20 1007)   Followed by  . amiodarone    . diltiazem (CARDIZEM) infusion Stopped (11/24/20 1138)  . sodium bicarbonate (isotonic) 150 mEq in D5W 1000 mL infusion 50 mL/hr at 11/25/20 1152     LOS: 1 day    Time  spent: Victor, MD [facility] To contact the attending provider between 7A-7P or the covering provider during after hours 7P-7A, please log into the web site [email or web address] and access using universal  password for that web site. If you do not have the password, please call the hospital operator.  11/25/2020, 1:22 PM
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
## 2020-11-25 NOTE — Consult Note (Signed)
I have placed a request via Secure Chat to Dr.Samtani requesting photos of the wound areas of concern to be placed in the EMR.    Jhaniya Briski MSN,RN,CWOCN, CNS, CWON-AP [phone]  

## 2020-11-25 NOTE — Progress Notes (Signed)
   11/25/20 1011  Assess: MEWS Score  BP 103/60  Pulse Rate (!) 48  ECG Heart Rate (!) 139  SpO2 96 %  O2 Device Room Air  Assess: MEWS Score  MEWS Temp 0  MEWS Systolic 0  MEWS Pulse 3  MEWS RR 0  MEWS LOC 0  MEWS Score 3  MEWS Score Color Yellow  Assess: if the MEWS score is Yellow or Red  Were vital signs taken at a resting state? Yes  Focused Assessment Change from prior assessment (see assessment flowsheet)  Early Detection of Sepsis Score *See Row Information* Low  MEWS guidelines implemented *See Row Information* Yes  Take Vital Signs  Increase Vital Sign Frequency  Yellow: Q 2hr X 2 then Q 4hr X 2, if remains yellow, continue Q 4hrs  Escalate  MEWS: Escalate Red: discuss with charge nurse/RN and provider, consider discussing with RRT  Notify: Charge Nurse/RN  Name of Charge Nurse/RN Notified Tammy RN   Date Charge Nurse/RN Notified 11/25/20  Time Charge Nurse/RN Notified 1000  Notify: Provider  Provider Name/Title Gerald Stabs NP  Date Provider Notified 11/25/20  Time Provider Notified [phone]  Notification Type Face-to-face  Notification Reason Change in status  Response See new orders  Date of Provider Response 11/25/20  Time of Provider Response [phone]

## 2020-11-26 DIAGNOSIS — N179 Acute kidney failure, unspecified: Secondary | ICD-10-CM | POA: Diagnosis not present

## 2020-11-26 DIAGNOSIS — R778 Other specified abnormalities of plasma proteins: Secondary | ICD-10-CM | POA: Diagnosis not present

## 2020-11-26 DIAGNOSIS — I4891 Unspecified atrial fibrillation: Secondary | ICD-10-CM | POA: Diagnosis not present

## 2020-11-26 DIAGNOSIS — I87013 Postthrombotic syndrome with ulcer of bilateral lower extremity: Secondary | ICD-10-CM

## 2020-11-26 LAB — COMPREHENSIVE METABOLIC PANEL
ALT: 273 U/L — ABNORMAL HIGH (ref 0–44)
AST: 160 U/L — ABNORMAL HIGH (ref 15–41)
Albumin: 2.4 g/dL — ABNORMAL LOW (ref 3.5–5.0)
Alkaline Phosphatase: 89 U/L (ref 38–126)
Anion gap: 16 — ABNORMAL HIGH (ref 5–15)
BUN: 80 mg/dL — ABNORMAL HIGH (ref 8–23)
CO2: 19 mmol/L — ABNORMAL LOW (ref 22–32)
Calcium: 7.3 mg/dL — ABNORMAL LOW (ref 8.9–10.3)
Chloride: 105 mmol/L (ref 98–111)
Creatinine, Ser: 5.08 mg/dL — ABNORMAL HIGH (ref 0.44–1.00)
GFR, Estimated: 9 mL/min — ABNORMAL LOW (ref >60)
Glucose, Bld: 165 mg/dL — ABNORMAL HIGH (ref 70–99)
Potassium: 4.1 mmol/L (ref 3.5–5.1)
Sodium: 140 mmol/L (ref 135–145)
Total Bilirubin: 0.7 mg/dL (ref 0.3–1.2)
Total Protein: 5.6 g/dL — ABNORMAL LOW (ref 6.5–8.1)

## 2020-11-26 LAB — CBC WITH DIFFERENTIAL/PLATELET
Abs Immature Granulocytes: 0.07 10*3/uL (ref 0.00–0.07)
Basophils Absolute: 0.1 10*3/uL (ref 0.0–0.1)
Basophils Relative: 1 %
Eosinophils Absolute: 0.1 10*3/uL (ref 0.0–0.5)
Eosinophils Relative: 1 %
HCT: 31.2 % — ABNORMAL LOW (ref 36.0–46.0)
Hemoglobin: 9.5 g/dL — ABNORMAL LOW (ref 12.0–15.0)
Immature Granulocytes: 1 %
Lymphocytes Relative: 10 %
Lymphs Abs: 1.1 10*3/uL (ref 0.7–4.0)
MCH: 25.1 pg — ABNORMAL LOW (ref 26.0–34.0)
MCHC: 30.4 g/dL (ref 30.0–36.0)
MCV: 82.5 fL (ref 80.0–100.0)
Monocytes Absolute: 0.9 10*3/uL (ref 0.1–1.0)
Monocytes Relative: 8 %
Neutro Abs: 8.6 10*3/uL — ABNORMAL HIGH (ref 1.7–7.7)
Neutrophils Relative %: 79 %
Platelets: 217 10*3/uL (ref 150–400)
RBC: 3.78 MIL/uL — ABNORMAL LOW (ref 3.87–5.11)
RDW: 17 % — ABNORMAL HIGH (ref 11.5–15.5)
WBC: 10.8 10*3/uL — ABNORMAL HIGH (ref 4.0–10.5)
nRBC: 0.2 % (ref 0.0–0.2)

## 2020-11-26 MED ORDER — SODIUM BICARBONATE 8.4 % IV SOLN
INTRAVENOUS | Status: DC
  Filled 2020-11-26 (×4): qty 850
  Filled 2020-11-26 (×2): qty 150
  Filled 2020-11-26: qty 850

## 2020-11-26 MED ORDER — AMIODARONE LOAD VIA INFUSION
150.0000 mg | Freq: Once | INTRAVENOUS | Status: AC
  Administered 2020-11-26: 150 mg via INTRAVENOUS
  Filled 2020-11-26: qty 83.34

## 2020-11-26 NOTE — Progress Notes (Signed)
   11/26/20 1530  Assess: MEWS Score  Pulse Rate (!) 130   Pt pulsed was ranging between 130-145. Cardiologist made aware.

## 2020-11-26 NOTE — Progress Notes (Signed)
Progress Note  Patient Name: Brooke Hopkins Date of Encounter: 11/26/2020  [facility] Cardiologist: Kate Sable, MD   Subjective   No complaints this morning, appears more tired Husband at the bedside, patient now has air mattress Legs still sore, left leg wrapped and has been removed Telemetry reviewed, remains in atrial fibrillation with rapid rate variable 130 up to 150 Blood pressure low 80 systolic, mentating well she denies any other symptoms, denies shortness of breath  Inpatient Medications    Scheduled Meds: . apixaban  5 mg Oral BID  . clopidogrel  75 mg Oral Daily  . collagenase   Topical Daily   Continuous Infusions: . amiodarone 30 mg/hr (11/26/20 1048)  . diltiazem (CARDIZEM) infusion Stopped (11/24/20 1138)  . sodium bicarbonate (isotonic) 150 mEq in D5W 1000 mL infusion 75 mL/hr at 11/26/20 1114   PRN Meds: acetaminophen, HYDROcodone-acetaminophen, ondansetron (ZOFRAN) IV   Vital Signs    Vitals:   11/25/20 2300 11/26/20 0531 11/26/20 0807 11/26/20 1115  BP:  119/75 (!) 113/94 105/83  Pulse:  (!) 140 (!) 125 (!) 146  Resp: 19   18  Temp:  98.3 F (36.8 C) 98.5 F (36.9 C) 98.1 F (36.7 C)  TempSrc:  Oral Oral Oral  SpO2:   98% 98%  Weight:      Height:        Intake/Output Summary (Last 24 hours) at 11/26/2020 1334 Last data filed at 11/26/2020 0500 Gross per 24 hour  Intake 1620.54 ml  Output 0 ml  Net 1620.54 ml   Last 3 Weights 11/26/2020 11/25/2020 11/24/2020  Weight (lbs) (No Data) 271 lb 14.4 oz 280 lb  Weight (kg) (No Data) 123.333 kg 127.007 kg  Some encounter information is confidential and restricted. Go to Review Flowsheets activity to see all data.      Telemetry    Atrial fibrillation with RVR rate 130 up to 140 bpm- Personally Reviewed  ECG     - Personally Reviewed  Physical Exam   GEN: No acute distress.  Obese  neck:  Unable to estimate JVD Cardiac:  Rapid, irregularly irregular, no murmurs,  rubs, or gallops.  Respiratory:  Scattered Rales throughout GI: Soft, nontender, non-distended  MS:  1+ anasarca/edema; No deformity. Unable to move her legs Neuro:  Nonfocal  Psych: Normal affect   Labs    High Sensitivity Troponin:   Recent Labs  Lab 11/24/20 0934 11/24/20 1128 11/24/20 1338 11/24/20 1528  TROPONINIHS 194* 190* 205* 212*      Chemistry Recent Labs  Lab 11/24/20 0934 11/25/20 0723 11/26/20 0502  NA 141 140 140  K 4.4 4.2 4.1  CL 107 108 105  CO2 19* 19* 19*  GLUCOSE 90 89 165*  BUN 76* 79* 80*  CREATININE 4.57* 4.88* 5.08*  CALCIUM 8.0* 7.9* 7.3*  PROT 6.3*  --  5.6*  ALBUMIN 2.8*  --  2.4*  AST 463*  --  160*  ALT 401*  --  273*  ALKPHOS 105  --  89  BILITOT 1.3*  --  0.7  GFRNONAA 10* 9* 9*  ANIONGAP 15 13 16*     Hematology Recent Labs  Lab 11/24/20 0934 11/25/20 0723 11/26/20 0502  WBC 10.1 9.0 10.8*  RBC 4.05 3.69* 3.78*  HGB 10.0* 9.2* 9.5*  HCT 34.2* 30.8* 31.2*  MCV 84.4 83.5 82.5  MCH 24.7* 24.9* 25.1*  MCHC 29.2* 29.9* 30.4  RDW 16.8* 16.9* 17.0*  PLT 277 230 217  BNPNo results for input(s): BNP, PROBNP in the last 168 hours.   DDimer No results for input(s): DDIMER in the last 168 hours.   Radiology    CT HEAD WO CONTRAST  Result Date: 11/25/2020 CLINICAL DATA:  Altered mental status. EXAM: CT HEAD WITHOUT CONTRAST TECHNIQUE: Contiguous axial images were obtained from the base of the skull through the vertex without intravenous contrast. COMPARISON:  MR brain dated 09/13/2020 and CT head dated 09/12/2020. FINDINGS: Brain: No evidence of acute infarction, hemorrhage, hydrocephalus, extra-axial collection or mass lesion/mass effect. The previously seen subcentimeter acute infarct is not identified on today's exam. 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 There is mild cerebral volume loss with associated ex vacuo dilatation. Periventricular white matter  hypoattenuation likely represents chronic small vessel ischemic disease. Vascular: There are vascular calcifications in the carotid siphons. Skull: Normal. Negative for fracture or focal lesion. Sinuses/Orbits: No acute finding. Other: None. IMPRESSION: No acute intracranial process. Electronically Signed   By: Zerita Boers M.D.   On: 11/25/2020 15:57   US Abdomen Complete  Result Date: 11/24/2020 CLINICAL DATA:  Transaminitis. EXAM: ABDOMEN ULTRASOUND COMPLETE COMPARISON:  None. FINDINGS: Gallbladder: Surgically removed. Common bile duct: Diameter: 0.4 cm Liver: No focal lesion identified. Echogenicity is within normal limits. Liver contour is mildly nodular. Portal vein is patent on color Doppler imaging with normal direction of blood flow towards the liver. IVC: No abnormality visualized. Pancreas: Visualized portion unremarkable. Spleen: Size and appearance within normal limits. Right Kidney: Length: 9.3 cm. Echogenicity within normal limits. No mass or hydronephrosis visualized. Left Kidney: Length: 9.7 cm. Echogenicity within normal limits. No mass or hydronephrosis visualized. Abdominal aorta: Mid and distal abdominal aorta cannot be visualized due to bowel gas. Other findings: Right pleural effusion. IMPRESSION: 1. Liver contour is mildly nodular and concerning for cirrhosis. No biliary dilatation. Cholecystectomy. 2. Right pleural effusion. Electronically Signed   By: Markus Daft M.D.   On: 11/24/2020 15:25   ECHOCARDIOGRAM COMPLETE  Result Date: 11/25/2020    ECHOCARDIOGRAM REPORT   Patient Name:   Brooke Hopkins Date of Exam: 11/25/2020 Medical Rec #:  [id number]         Height:       67.0 in Accession #:    [id number]        Weight:       271.9 lb Date of Birth:  1951-01-08         BSA:          2.304 m Patient Age:    59 years          BP:           116/49 mmHg Patient Gender: F                 HR:           154 bpm. Exam Location:  [facility] Procedure: 2D Echo, Cardiac Doppler and Color Doppler  Indications:     Atrial Fibrillation 427.31 / I48.91  History:         Patient has prior history of Echocardiogram examinations. Risk                  Factors:Hypertension.  Sonographer:     Alyse Low Roar Referring Phys:  Cutter Diagnosing Phys: Ida Rogue MD IMPRESSIONS  1. Left ventricular ejection fraction, by estimation, is 30 to 35%. The left ventricle has moderately decreased function. The left ventricle demonstrates  global hypokinesis. Unable to exclude regional wall motion abnormality, select images with hypokinesis of the anterior and anteroseptal wall though not well visualized. Left ventricular diastolic parameters are indeterminate.  2. Right ventricular systolic function is normal. The right ventricular size is normal. There is mildly elevated pulmonary artery systolic pressure. The estimated right ventricular systolic pressure is 46.5 mmHg.  3. Left atrial size was moderately dilated.  4. Right atrial size was moderately dilated.  5. Tricuspid valve regurgitation is moderate.  6. Mild mitral valve regurgitation.  7. A small pericardial effusion is present.  8. Rhythm is atrial fibrillation with RVR FINDINGS  Left Ventricle: Left ventricular ejection fraction, by estimation, is 30 to 35%. The left ventricle has moderately decreased function. The left ventricle demonstrates global hypokinesis. The left ventricular internal cavity size was normal in size. There is no left ventricular hypertrophy. Left ventricular diastolic parameters are indeterminate. Right Ventricle: The right ventricular size is normal. No increase in right ventricular wall thickness. Right ventricular systolic function is normal. There is mildly elevated pulmonary artery systolic pressure. The tricuspid regurgitant velocity is 2.97  m/s, and with an assumed right atrial pressure of 5 mmHg, the estimated right ventricular systolic pressure is 03.5 mmHg. Left Atrium: Left atrial size was moderately dilated.  Right Atrium: Right atrial size was moderately dilated. Pericardium: A small pericardial effusion is present. Mitral Valve: The mitral valve is normal in structure. Mild mitral valve regurgitation. No evidence of mitral valve stenosis. Tricuspid Valve: The tricuspid valve is normal in structure. Tricuspid valve regurgitation is moderate . No evidence of tricuspid stenosis. Aortic Valve: The aortic valve was not well visualized. Aortic valve regurgitation is not visualized. Mild to moderate aortic valve sclerosis/calcification is present, without any evidence of aortic stenosis. Aortic valve peak gradient measures 8.8 mmHg. Pulmonic Valve: The pulmonic valve was normal in structure. Pulmonic valve regurgitation is trivial. No evidence of pulmonic stenosis. Aorta: The aortic root is normal in size and structure. Venous: The inferior vena cava is normal in size with greater than 50% respiratory variability, suggesting right atrial pressure of 3 mmHg. IAS/Shunts: No atrial level shunt detected by color flow Doppler.  LEFT VENTRICLE PLAX 2D LVIDd:         3.87 cm  Diastology LVIDs:         3.29 cm  LV e' medial:    7.72 cm/s LV PW:         1.10 cm  LV E/e' medial:  12.8 LV IVS:        1.16 cm  LV e' lateral:   9.36 cm/s LVOT diam:     1.50 cm  LV E/e' lateral: 10.5 LVOT Area:     1.77 cm  RIGHT VENTRICLE RV Mid diam:    4.01 cm LEFT ATRIUM             Index LA diam:        4.30 cm 1.87 cm/m LA Vol (A2C):   71.9 ml 31.20 ml/m LA Vol (A4C):   86.1 ml 37.37 ml/m LA Biplane Vol: 78.9 ml 34.24 ml/m  AORTIC VALVE                PULMONIC VALVE AV Area (Vmax): 0.88 cm    PV Vmax:        0.79 m/s AV Vmax:        148.00 cm/s PV Peak grad:   2.5 mmHg AV Peak Grad:   8.8 mmHg    RVOT Peak grad:  1 mmHg LVOT Vmax:      73.30 cm/s  AORTA Ao Root diam: 2.60 cm MITRAL VALVE               TRICUSPID VALVE MV Area (PHT): 3.12 cm    TR Peak grad:   35.3 mmHg MV Decel Time: 243 msec    TR Vmax:        297.00 cm/s MV E velocity: 98.50  cm/s                            SHUNTS                            Systemic Diam: 1.50 cm Ida Rogue MD Electronically signed by Ida Rogue MD Signature Date/Time: 11/25/2020/2:56:01 PM    Final     Cardiac Studies   Echocardiogram yesterday  1. Left ventricular ejection fraction, by estimation, is 30 to 35%. The  left ventricle has moderately decreased function. The left ventricle  demonstrates global hypokinesis. Unable to exclude regional wall motion  abnormality, select images with  hypokinesis of the anterior and anteroseptal wall though not well  visualized. Left ventricular diastolic parameters are indeterminate.  2. Right ventricular systolic function is normal. The right ventricular  size is normal. There is mildly elevated pulmonary artery systolic  pressure. The estimated right ventricular systolic pressure is 96.2 mmHg.  3. Left atrial size was moderately dilated.  4. Right atrial size was moderately dilated.  5. Tricuspid valve regurgitation is moderate.  6. Mild mitral valve regurgitation.  7. A small pericardial effusion is present.  8. Rhythm is atrial fibrillation with RVR   Patient Profile     69 y.o. female w/ a h/o HTN, DM, CVA, PAD s/ L poplitetal and AT stenting, obesity, CKD III, chronic lymphedema, and decubitus ulcers, who presented 11/26 w/ hypoglycemia, Afib w/ RVR, and hypotension req emergent DCCV in the emergency room, normal sinus rhythm restored, converting back to atrial fibrillation November 27,  9 AM  Assessment & Plan   Atrial fibrillation with RVR Recurrence of arrhythmia, little improvement in rate on amiodarone infusion -Unable to add diltiazem or beta blockers given hypotension Unable to use digoxin secondary to renal failure -Only options at this time is to continue amiodarone infusion and plan for cardioversion She is on Eliquis -She is mentating well despite low blood pressure, blood pressure 80 systolic on my rounds now up  to 836 systolic. Long discussion with patient and husband, we have recommended repeat cardioversion, they are in agreement, husband will sign consent for her.  She wishes for him to make decisions for her  Elevated troponin Demand ischemia in the setting of rapid atrial fibrillation Aspirin deferred secondary to Eliquis Rate continues to be fast as detailed above No plan for ischemic work-up at this time  Acute renal failure Likely ATN component, well above her baseline She has received IV fluids, Lasix and ACE inhibitor on hold -Nephrology following, numbers still trending upwards  Transaminitis Shock liver, tachycardia hypotension Statin on hold -We will monitor on amiodarone -Numbers may continue to trend higher with atrial fibrillation with RVR, concern for hepatic congestion  History of strokes Currently on Eliquis  PAD On Eliquis, previously on aspirin Plavix statin Prior intervention lower extremities Status post prior left pop/AT stenting Followed by vascular surgery Nonhealing wound left lower extremity  Difficult case, atrial fibrillation with RVR, few  options for rate or rhythm control, Discussed at length with the patient and husband at the bedside, orders placed for cardioversion, discussed with nursing  Total encounter time more than 35 minutes  Greater than 50% was spent in counseling and coordination of care with the patient  For questions or updates, please contact [place] Please consult [email or web address] for contact info under        Signed, Ida Rogue, MD  11/26/2020, 1:34 PM

## 2020-11-26 NOTE — Progress Notes (Signed)
   11/26/20 1608 11/26/20 1623  Notify: Provider  Provider Name/Title Dr. Johnny Bridge Dr. Verlon Au  Date Provider Notified 11/26/20 11/26/20  Time Provider Notified [phone]  Notification Type Page Page  Notification Reason Other (Comment) (Low BP) Other (Comment) (Low BP)  Response No new orders No new orders  Date of Provider Response 11/26/20 11/26/20  Time of Provider Response 1622 1624   Pt has a MEWS of Red, MDs notified. No new orders at the moment

## 2020-11-26 NOTE — Progress Notes (Signed)
   11/26/20 1345  Assess: MEWS Score  Pulse Rate (!) 165  Cardiologist notified via secure chat.

## 2020-11-26 NOTE — Progress Notes (Signed)
Pt pulse rage from 154-160 on the tele monitor. Dr. Verlon Au notified via secure chat.

## 2020-11-26 NOTE — Progress Notes (Signed)
   11/26/20 1345  Assess: MEWS Score  Pulse Rate (!) 165   BP 111/63  MD notified.

## 2020-11-26 NOTE — Consult Note (Addendum)
[facility] Nurse wound consult note Consultation was completed by review of records, images and assistance from the bedside nurse/clinical staff.   Reason for Consult: heel and leg wound Wound type: 1. Vascular type; linear full thickness wound: right lateral leg 2. Unstageable Pressure injury; right heel  3. Sacrum; Stage 3 Pressure injury; full thickness  Pressure Injury POA: Yes Measurement: see nursing flowsheets Wound bed: Right lateral leg: 80% yellow fibrinous/20% clean Right heel: dark black; non blanchable  Drainage (amount, consistency, odor) see nursing flow sheets  Periwound: intact  Dressing procedure/placement/frequency: Prevalon boot to offload the left heel; paint with betadine daily. Allow to air dry, protect with silicone foam.  Enzymatic debridement ointment to the left lateral leg; cover with saline moist gauze dressing. Top with dry dressing. Change daily.  Saline moist gauze topper; top with foam. Change gauze daily/change topper every 3 days.   Discussed POC with bedside nurse.  Re consult if needed, will not follow at this time. Thanks  Louellen Haldeman [employer], RN,CWOCN, CNS, [place] [phone])

## 2020-11-26 NOTE — Progress Notes (Signed)
[facility]  ROUNDING NOTE   Subjective:   Husband at bedside. Patient states she is feeling much better.   Creatinine 5.08 (4.88) No urine output recorded  Objective:  Vital signs in last 24 hours:  Temp:  [97.5 F (36.4 C)-98.5 F (36.9 C)] 98.1 F (36.7 C) (11/28 1115) Pulse Rate:  [47-146] 146 (11/28 1115) Resp:  [17-19] 18 (11/28 1115) BP: (97-119)/(59-94) 105/83 (11/28 1115) SpO2:  [94 %-100 %] 98 % (11/28 1115)  Weight change:  Filed Weights   11/24/20 0931 11/25/20 0219  Weight: 127 kg 123.3 kg    Intake/Output: I/O last 3 completed shifts: In: 1860.5 [P.O.:600; I.V.:1260.5] Out: 0    Intake/Output this shift:  No intake/output data recorded.  Physical Exam: General: NAD, laying in bed  Head: Normocephalic, atraumatic. Moist oral mucosal membranes  Eyes: Anicteric, PERRL  Neck: Supple, trachea midline  Lungs:  Clear to auscultation  Heart: Regular rate and rhythm  Abdomen:  Soft, nontender, obese  Extremities: + peripheral edema. Left leg in dressings  Neurologic: Nonfocal, moving all four extremities  Skin: No lesions        Basic Metabolic Panel: Recent Labs  Lab 11/24/20 0934 11/25/20 0723 11/26/20 0502  NA 141 140 140  K 4.4 4.2 4.1  CL 107 108 105  CO2 19* 19* 19*  GLUCOSE 90 89 165*  BUN 76* 79* 80*  CREATININE 4.57* 4.88* 5.08*  CALCIUM 8.0* 7.9* 7.3*  MG  --  1.8  --     Liver Function Tests: Recent Labs  Lab 11/24/20 0934 11/26/20 0502  AST 463* 160*  ALT 401* 273*  ALKPHOS 105 89  BILITOT 1.3* 0.7  PROT 6.3* 5.6*  ALBUMIN 2.8* 2.4*   No results for input(s): LIPASE, AMYLASE in the last 168 hours. No results for input(s): AMMONIA in the last 168 hours.  CBC: Recent Labs  Lab 11/24/20 0934 11/25/20 0723 11/26/20 0502  WBC 10.1 9.0 10.8*  NEUTROABS  --   --  8.6*  HGB 10.0* 9.2* 9.5*  HCT 34.2* 30.8* 31.2*  MCV 84.4 83.5 82.5  PLT 277 230 217    Cardiac Enzymes: No results for input(s):  CKTOTAL, CKMB, CKMBINDEX, TROPONINI in the last 168 hours.  BNP: Invalid input(s): POCBNP  CBG: Recent Labs  Lab 11/25/20 0417  GLUCAP 72    Microbiology: Results for orders placed or performed during the hospital encounter of 11/24/20  Resp Panel by RT-PCR (Flu A&B, Covid) Nasopharyngeal Swab     Status: None   Collection Time: 11/24/20  9:35 AM   Specimen: Nasopharyngeal Swab; Nasopharyngeal(NP) swabs in vial transport medium  Result Value Ref Range Status   SARS Coronavirus 2 by RT PCR NEGATIVE NEGATIVE Final    Comment: (NOTE) SARS-CoV-2 target nucleic acids are NOT DETECTED.  The SARS-CoV-2 RNA is generally detectable in upper respiratory specimens during the acute phase of infection. The lowest concentration of SARS-CoV-2 viral copies this assay can detect is 138 copies/mL. A negative result does not preclude SARS-Cov-2 infection and should not be used as the sole basis for treatment or other patient management decisions. A negative result may occur with  improper specimen collection/handling, submission of specimen other than nasopharyngeal swab, presence of viral mutation(s) within the areas targeted by this assay, and inadequate number of viral copies(<138 copies/mL). A negative result must be combined with clinical observations, patient history, and epidemiological information. The expected result is Negative.  Fact Sheet for Patients:  EntrepreneurPulse.com.au  Fact Sheet for  Healthcare Providers:  IncredibleEmployment.be  This test is no t yet approved or cleared by the [place] and  has been authorized for detection and/or diagnosis of SARS-CoV-2 by FDA under an Emergency Use Authorization (EUA). This EUA will remain  in effect (meaning this test can be used) for the duration of the COVID-19 declaration under Section 564(b)(1) of the Act, 21 U.S.C.section 360bbb-3(b)(1), unless the authorization is terminated  or  revoked sooner.       Influenza A by PCR NEGATIVE NEGATIVE Final   Influenza B by PCR NEGATIVE NEGATIVE Final    Comment: (NOTE) The Xpert Xpress SARS-CoV-2/FLU/RSV plus assay is intended as an aid in the diagnosis of influenza from Nasopharyngeal swab specimens and should not be used as a sole basis for treatment. Nasal washings and aspirates are unacceptable for Xpert Xpress SARS-CoV-2/FLU/RSV testing.  Fact Sheet for Patients: EntrepreneurPulse.com.au  Fact Sheet for Healthcare Providers: IncredibleEmployment.be  This test is not yet approved or cleared by the [place] FDA and has been authorized for detection and/or diagnosis of SARS-CoV-2 by FDA under an Emergency Use Authorization (EUA). This EUA will remain in effect (meaning this test can be used) for the duration of the COVID-19 declaration under Section 564(b)(1) of the Act, 21 U.S.C. section 360bbb-3(b)(1), unless the authorization is terminated or revoked.  Performed at [facility], [address] [place], [place] [address]     Coagulation Studies: No results for input(s): LABPROT, INR in the last 72 hours.  Urinalysis: No results for input(s): COLORURINE, LABSPEC, PHURINE, GLUCOSEU, HGBUR, BILIRUBINUR, KETONESUR, PROTEINUR, UROBILINOGEN, NITRITE, LEUKOCYTESUR in the last 72 hours.  Invalid input(s): APPERANCEUR    Imaging: CT HEAD WO CONTRAST  Result Date: 11/25/2020 CLINICAL DATA:  Altered mental status. EXAM: CT HEAD WITHOUT CONTRAST TECHNIQUE: Contiguous axial images were obtained from the base of the skull through the vertex without intravenous contrast. COMPARISON:  MR brain dated 09/13/2020 and CT head dated 09/12/2020. FINDINGS: Brain: No evidence of acute infarction, hemorrhage, hydrocephalus, extra-axial collection or mass lesion/mass effect. The previously seen subcentimeter acute infarct is not identified on today's exam. Chronic infarcts of the  left external capsule, thalamus, and pons are not significantly changed. There is mild cerebral volume loss with associated ex vacuo dilatation. Periventricular white matter hypoattenuation likely represents chronic small vessel ischemic disease. Vascular: There are vascular calcifications in the carotid siphons. Skull: Normal. Negative for fracture or focal lesion. Sinuses/Orbits: No acute finding. Other: None. IMPRESSION: No acute intracranial process. Electronically Signed   By: Zerita Boers M.D.   On: 11/25/2020 15:57   US Abdomen Complete  Result Date: 11/24/2020 CLINICAL DATA:  Transaminitis. EXAM: ABDOMEN ULTRASOUND COMPLETE COMPARISON:  None. FINDINGS: Gallbladder: Surgically removed. Common bile duct: Diameter: 0.4 cm Liver: No focal lesion identified. Echogenicity is within normal limits. Liver contour is mildly nodular. Portal vein is patent on color Doppler imaging with normal direction of blood flow towards the liver. IVC: No abnormality visualized. Pancreas: Visualized portion unremarkable. Spleen: Size and appearance within normal limits. Right Kidney: Length: 9.3 cm. Echogenicity within normal limits. No mass or hydronephrosis visualized. Left Kidney: Length: 9.7 cm. Echogenicity within normal limits. No mass or hydronephrosis visualized. Abdominal aorta: Mid and distal abdominal aorta cannot be visualized due to bowel gas. Other findings: Right pleural effusion. IMPRESSION: 1. Liver contour is mildly nodular and concerning for cirrhosis. No biliary dilatation. Cholecystectomy. 2. Right pleural effusion. Electronically Signed   By: Markus Daft M.D.   On: 11/24/2020 15:25  ECHOCARDIOGRAM COMPLETE  Result Date: 11/25/2020    ECHOCARDIOGRAM REPORT   Patient Name:   JAKEIRA SEEMAN Date of Exam: 11/25/2020 Medical Rec #:  [id number]         Height:       67.0 in Accession #:    [id number]        Weight:       271.9 lb Date of Birth:  [birth year]         BSA:          2.304 m Patient Age:    89  years          BP:           116/49 mmHg Patient Gender: F                 HR:           154 bpm. Exam Location:  [facility] Procedure: 2D Echo, Cardiac Doppler and Color Doppler Indications:     Atrial Fibrillation 427.31 / I48.91  History:         Patient has prior history of Echocardiogram examinations. Risk                  Factors:Hypertension.  Sonographer:     Alyse Low Roar Referring Phys:  [place] Diagnosing Phys: Ida Rogue MD IMPRESSIONS  1. Left ventricular ejection fraction, by estimation, is 30 to 35%. The left ventricle has moderately decreased function. The left ventricle demonstrates global hypokinesis. Unable to exclude regional wall motion abnormality, select images with hypokinesis of the anterior and anteroseptal wall though not well visualized. Left ventricular diastolic parameters are indeterminate.  2. Right ventricular systolic function is normal. The right ventricular size is normal. There is mildly elevated pulmonary artery systolic pressure. The estimated right ventricular systolic pressure is 01.0 mmHg.  3. Left atrial size was moderately dilated.  4. Right atrial size was moderately dilated.  5. Tricuspid valve regurgitation is moderate.  6. Mild mitral valve regurgitation.  7. A small pericardial effusion is present.  8. Rhythm is atrial fibrillation with RVR FINDINGS  Left Ventricle: Left ventricular ejection fraction, by estimation, is 30 to 35%. The left ventricle has moderately decreased function. The left ventricle demonstrates global hypokinesis. The left ventricular internal cavity size was normal in size. There is no left ventricular hypertrophy. Left ventricular diastolic parameters are indeterminate. Right Ventricle: The right ventricular size is normal. No increase in right ventricular wall thickness. Right ventricular systolic function is normal. There is mildly elevated pulmonary artery systolic pressure. The tricuspid regurgitant velocity is 2.97  m/s,  and with an assumed right atrial pressure of 5 mmHg, the estimated right ventricular systolic pressure is 27.2 mmHg. Left Atrium: Left atrial size was moderately dilated. Right Atrium: Right atrial size was moderately dilated. Pericardium: A small pericardial effusion is present. Mitral Valve: The mitral valve is normal in structure. Mild mitral valve regurgitation. No evidence of mitral valve stenosis. Tricuspid Valve: The tricuspid valve is normal in structure. Tricuspid valve regurgitation is moderate . No evidence of tricuspid stenosis. Aortic Valve: The aortic valve was not well visualized. Aortic valve regurgitation is not visualized. Mild to moderate aortic valve sclerosis/calcification is present, without any evidence of aortic stenosis. Aortic valve peak gradient measures 8.8 mmHg. Pulmonic Valve: The pulmonic valve was normal in structure. Pulmonic valve regurgitation is trivial. No evidence of pulmonic stenosis. Aorta: The aortic root is normal in size and structure. Venous: The inferior vena cava  is normal in size with greater than 50% respiratory variability, suggesting right atrial pressure of 3 mmHg. IAS/Shunts: No atrial level shunt detected by color flow Doppler.  LEFT VENTRICLE PLAX 2D LVIDd:         3.87 cm  Diastology LVIDs:         3.29 cm  LV e' medial:    7.72 cm/s LV PW:         1.10 cm  LV E/e' medial:  12.8 LV IVS:        1.16 cm  LV e' lateral:   9.36 cm/s LVOT diam:     1.50 cm  LV E/e' lateral: 10.5 LVOT Area:     1.77 cm  RIGHT VENTRICLE RV Mid diam:    4.01 cm LEFT ATRIUM             Index LA diam:        4.30 cm 1.87 cm/m LA Vol (A2C):   71.9 ml 31.20 ml/m LA Vol (A4C):   86.1 ml 37.37 ml/m LA Biplane Vol: 78.9 ml 34.24 ml/m  AORTIC VALVE                PULMONIC VALVE AV Area (Vmax): 0.88 cm    PV Vmax:        0.79 m/s AV Vmax:        148.00 cm/s PV Peak grad:   2.5 mmHg AV Peak Grad:   8.8 mmHg    RVOT Peak grad: 1 mmHg LVOT Vmax:      73.30 cm/s  AORTA Ao Root diam: 2.60 cm  MITRAL VALVE               TRICUSPID VALVE MV Area (PHT): 3.12 cm    TR Peak grad:   35.3 mmHg MV Decel Time: 243 msec    TR Vmax:        297.00 cm/s MV E velocity: 98.50 cm/s                            SHUNTS                            Systemic Diam: 1.50 cm Ida Rogue MD Electronically signed by Ida Rogue MD Signature Date/Time: 11/25/2020/2:56:01 PM    Final      Medications:   . amiodarone 30 mg/hr (11/26/20 1048)  . diltiazem (CARDIZEM) infusion Stopped (11/24/20 1138)  . sodium bicarbonate (isotonic) 150 mEq in D5W 1000 mL infusion 75 mL/hr at 11/26/20 1114   . apixaban  5 mg Oral BID  . clopidogrel  75 mg Oral Daily  . collagenase   Topical Daily   acetaminophen, HYDROcodone-acetaminophen, ondansetron (ZOFRAN) IV  Assessment/ Plan:  Ms. Brooke Hopkins is a 69 y.o. white female with hypertension, diabetes mellitus type II insulin dependent, peripheral arterial disease, CVA, lymphedema, chronic lower extremity wounds, bed bound who was admitted to [facility] on 11/24/2020 for New onset atrial fibrillation ([place]) [I48.91] Elevated troponin [R77.8] Atrial fibrillation with rapid ventricular response ([place]) [I48.91] Acute renal failure, unspecified acute renal failure type ([place]) [N17.9]  1. Acute renal failure with metabolic acidosis on chronic kidney disease stage IIIA: Baseline creatinine of 1.03, GFR of 55 on 09/13/2020  History of proteinuria, hematuria and glycosuria.  Acute renal failure secondary to poor PO intake, nausea, vomiting, acute cardiorenal syndrome and suspect ATN.  Chronic kidney disease secondary to  diabetes and hypertension.  Low threshold for dialysis. Discussed risks and benefits of dialysis with patient and husband.  - Holding furosemide and lisinopril.  - Encourage PO intake. - Continue IV fluids  2. Hypotension: with new onset atrial fibrillation. Required electrocardioversion and IV diltiazem infusion.  - amiodarone infusion.  - Appreciate  cardiology input  3. Anemia with renal failure: hemoglobin 9.5. Normocytic  4. Diabetes mellitus type II with chronic kidney disease: insulin dependent. Hemoglobin A1c of 5.3% on 09/14/20.     LOS: 2 Mylea Roarty 11/28/202111:38 AM

## 2020-11-26 NOTE — Progress Notes (Signed)
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 Florentina Addison and had 13 X 6 cm wound debrided 11/15/2020  Came to ED because of low blood sugar in the 50s-endorsed multiple areas of nausea vomiting at home noted to be tachycardic in A. fib given metoprolol IV x2 with no improvement started Cardizem gtt. developed hypotension and then was cardioverted in the ED for unstable atrial fibrillation Cardiology was consulted  Also found to have AKI on admission baseline creatinine 1.0-->76/4.5 with CO2 19 anion gap 15 and nephrology consult  Because of hypotension Cardizem and beta-blockers have significantly improved Amiodarone gtt. 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 New onset atrial fibrillation CHADS2 score >7 now on Eliquis a.  amiodarone gtt. At 60 cc/--has no other good options--was hypotensive with other agents b.  Eliquis started this admission-aspirin Plavix discontinued c. planning for DCCV in am 11/29 2. Left-sided weakness (new) in the setting of prior CVA  01/2015 08/2019 a. CT head neg for new CVA 11/27 b. Already on Eliquis would hold given risk of bleeding aspirin and Plavix at least 3. AKI and ATN wit oliguria a. Bladder scan neg--not obstructed b. Hold lisinopril 40/Lasix 20 c. sodium bicarb 150 mEq at 75 cc/H d. Follow-up SPEP, light chains, further work-up as per nephrologist-unclear if we will need biopsy 4. PVD status post multiple stents left side a. Plavix aspirin now on hold given need for Eliquis reevaluate as an outpatient may be able to resume aspirin 5. Hypoglycemia on admission a. CBGs 70-165, not on any sliding scale coverage b. Hold NPH 7030 10 twice daily for now 6. History of hypertension with hypotension currently a. Holdingabetalol 100 twice daily clonidine patch b. Reevaluate need based on recovery 7.  cirrhosis of the liver a. Possibly secondary to fatty liver versus hypotension and heart failure b. LFT's slightly down c. Hepatitis panels neg 8. chronic sacral and lower extremity wounds followed by Dr. Asa Saunas a. Picture of wounds performed wound nurse has made recommendations which we will try to follow she is quite tender to movement of either lower extremity specifically the right knee 9. Total assist at baseline  DVT prophylaxis: Eliquis Code Status: DNR verbalized prior patient at bedside Family Communication: Discussed with husband in detail Disposition:  Status is: Inpatient  Remains inpatient appropriate because:Persistent severe electrolyte disturbances, Ongoing diagnostic testing needed not appropriate for outpatient work up, Unsafe d/c plan and Inpatient level of care appropriate due to severity of illness   Dispo: The patient is from: Home  Anticipated d/c is to: Home              Anticipated d/c date is: > 3 days              Patient currently is not medically stable to d/c.       Consultants:   Cardiology  Procedures: Cardioversion 11/26 Echocardiogram  Antimicrobials:  None   Subjective:  Events noted--notified of hypotension and persisting tachycardia by RN She otherwise is well--no cp fever chills n/v    Objective: Vitals:   11/25/20 2300 11/26/20 0531 11/26/20 0807 11/26/20 1115  BP:  119/75 (!) 113/94 105/83  Pulse:  (!) 140 (!) 125 (!) 146  Resp: 19   18  Temp:  98.3 F (36.8 C) 98.5 F (36.9 C) 98.1 F (36.7 C)  TempSrc:  Oral Oral Oral  SpO2:   98% 98%  Weight:      Height:        Intake/Output Summary (Last 24 hours) at 11/26/2020 1510 Last data filed at 11/26/2020 0500 Gross per 24 hour  Intake 1407.91 ml  Output 0 ml  Net 1407.91 ml   Filed Weights   11/24/20 0931 11/25/20 0219  Weight: 127 kg 123.3 kg    Examination:  EOMI NCAT obese flat affect neck soft supple Chest clinically clear no added sound no rales no rhonchi S1-S2 no murmur no rub no gallop irregularly irregular rate in the 150s on monitors Abdomen soft nontender no rebound no guarding power remains good in upper extremities she does not have any recurrent numbness or other symptoms today I did not do a full neuro assessment  Wounds in her leg      Data Reviewed: I have personally reviewed following labs and imaging studies BUNs/creatinine 76/4.5-->79/4.8 CO2 19 Anion gap 13  Hemoglobin 10.0-->9.2 WBC 9.0 Platelet 230  Radiology Studies: CT HEAD WO CONTRAST  Result Date: 11/25/2020 CLINICAL DATA:  Altered mental status. EXAM: CT HEAD WITHOUT CONTRAST TECHNIQUE: Contiguous axial images were obtained from the base of the skull through the vertex without intravenous contrast. COMPARISON:  MR brain dated 09/13/2020 and CT head dated 09/12/2020. FINDINGS: Brain: No evidence of acute infarction, hemorrhage, hydrocephalus, extra-axial collection or mass lesion/mass effect. The previously seen subcentimeter acute infarct is not identified on today's exam. 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  There is mild cerebral volume loss with associated ex vacuo dilatation. Periventricular white matter hypoattenuation likely represents chronic small vessel ischemic disease. Vascular: There are vascular calcifications in the carotid siphons. Skull: Normal. Negative for fracture or focal lesion. Sinuses/Orbits: No acute finding. Other: None. IMPRESSION: No acute intracranial process. Electronically Signed   By: Zerita Boers M.D.   On: 11/25/2020 15:57   ECHOCARDIOGRAM COMPLETE  Result Date: 11/25/2020    ECHOCARDIOGRAM REPORT   Patient Name:   Brooke Hopkins Date of Exam: 11/25/2020 Medical Rec #:  [id number]         Height:       67.0 in Accession #:    [id number]        Weight:       271.9 lb Date of Birth:  [birth year]         BSA:          2.304 m Patient Age:    43 years          BP:           116/49 mmHg Patient Gender: F  HR:           154 bpm. Exam Location:  [facility] Procedure: 2D Echo, Cardiac Doppler and Color Doppler Indications:     Atrial Fibrillation 427.31 / I48.91  History:         Patient has prior history of Echocardiogram examinations. Risk                  Factors:Hypertension.  Sonographer:     Alyse Low Roar Referring Phys:  Bowers Diagnosing Phys: Ida Rogue MD IMPRESSIONS  1. Left ventricular ejection fraction, by estimation, is 30 to 35%. The left ventricle has moderately decreased function. The left ventricle demonstrates global hypokinesis. Unable to exclude regional wall motion abnormality, select images with hypokinesis of the anterior and anteroseptal wall though not well visualized. Left ventricular diastolic parameters are indeterminate.  2. Right ventricular systolic function is normal. The right ventricular size is normal. There is mildly elevated pulmonary artery systolic pressure. The estimated right ventricular systolic pressure is 09.8 mmHg.  3. Left atrial size was moderately dilated.  4. Right atrial size was moderately dilated.  5.  Tricuspid valve regurgitation is moderate.  6. Mild mitral valve regurgitation.  7. A small pericardial effusion is present.  8. Rhythm is atrial fibrillation with RVR FINDINGS  Left Ventricle: Left ventricular ejection fraction, by estimation, is 30 to 35%. The left ventricle has moderately decreased function. The left ventricle demonstrates global hypokinesis. The left ventricular internal cavity size was normal in size. There is no left ventricular hypertrophy. Left ventricular diastolic parameters are indeterminate. Right Ventricle: The right ventricular size is normal. No increase in right ventricular wall thickness. Right ventricular systolic function is normal. There is mildly elevated pulmonary artery systolic pressure. The tricuspid regurgitant velocity is 2.97  m/s, and with an assumed right atrial pressure of 5 mmHg, the estimated right ventricular systolic pressure is 11.9 mmHg. Left Atrium: Left atrial size was moderately dilated. Right Atrium: Right atrial size was moderately dilated. Pericardium: A small pericardial effusion is present. Mitral Valve: The mitral valve is normal in structure. Mild mitral valve regurgitation. No evidence of mitral valve stenosis. Tricuspid Valve: The tricuspid valve is normal in structure. Tricuspid valve regurgitation is moderate . No evidence of tricuspid stenosis. Aortic Valve: The aortic valve was not well visualized. Aortic valve regurgitation is not visualized. Mild to moderate aortic valve sclerosis/calcification is present, without any evidence of aortic stenosis. Aortic valve peak gradient measures 8.8 mmHg. Pulmonic Valve: The pulmonic valve was normal in structure. Pulmonic valve regurgitation is trivial. No evidence of pulmonic stenosis. Aorta: The aortic root is normal in size and structure. Venous: The inferior vena cava is normal in size with greater than 50% respiratory variability, suggesting right atrial pressure of 3 mmHg. IAS/Shunts: No atrial level  shunt detected by color flow Doppler.  LEFT VENTRICLE PLAX 2D LVIDd:         3.87 cm  Diastology LVIDs:         3.29 cm  LV e' medial:    7.72 cm/s LV PW:         1.10 cm  LV E/e' medial:  12.8 LV IVS:        1.16 cm  LV e' lateral:   9.36 cm/s LVOT diam:     1.50 cm  LV E/e' lateral: 10.5 LVOT Area:     1.77 cm  RIGHT VENTRICLE RV Mid diam:    4.01 cm LEFT ATRIUM  Index LA diam:        4.30 cm 1.87 cm/m LA Vol (A2C):   71.9 ml 31.20 ml/m LA Vol (A4C):   86.1 ml 37.37 ml/m LA Biplane Vol: 78.9 ml 34.24 ml/m  AORTIC VALVE                PULMONIC VALVE AV Area (Vmax): 0.88 cm    PV Vmax:        0.79 m/s AV Vmax:        148.00 cm/s PV Peak grad:   2.5 mmHg AV Peak Grad:   8.8 mmHg    RVOT Peak grad: 1 mmHg LVOT Vmax:      73.30 cm/s  AORTA Ao Root diam: 2.60 cm MITRAL VALVE               TRICUSPID VALVE MV Area (PHT): 3.12 cm    TR Peak grad:   35.3 mmHg MV Decel Time: 243 msec    TR Vmax:        297.00 cm/s MV E velocity: 98.50 cm/s                            SHUNTS                            Systemic Diam: 1.50 cm Ida Rogue MD Electronically signed by Ida Rogue MD Signature Date/Time: 11/25/2020/2:56:01 PM    Final      Scheduled Meds: . apixaban  5 mg Oral BID  . clopidogrel  75 mg Oral Daily  . collagenase   Topical Daily   Continuous Infusions: . amiodarone 30 mg/hr (11/26/20 1048)  . diltiazem (CARDIZEM) infusion Stopped (11/24/20 1138)  . sodium bicarbonate (isotonic) 150 mEq in D5W 1000 mL infusion 75 mL/hr at 11/26/20 1114     LOS: 2 days    Time spent: 50  Nita Sells, MD [facility] To contact the attending provider between 7A-7P or the covering provider during after hours 7P-7A, please log into the web site [email or web address] and access using universal [place] password for that web site. If you do not have the password, please call the hospital operator.  11/26/2020, 3:10 PM

## 2020-11-27 ENCOUNTER — Encounter
Admission: EM | Disposition: A | Discharge status: Hospice | Payer: Self-pay | Source: Home / Self Care | Attending: Internal Medicine

## 2020-11-27 ENCOUNTER — Ambulatory Visit (INDEPENDENT_AMBULATORY_CARE_PROVIDER_SITE_OTHER): Payer: Medicare HMO | Admitting: Vascular Surgery

## 2020-11-27 ENCOUNTER — Inpatient Hospital Stay: Payer: Medicare HMO | Admitting: Certified Registered Nurse Anesthetist

## 2020-11-27 ENCOUNTER — Telehealth: Payer: Self-pay | Admitting: Cardiology

## 2020-11-27 ENCOUNTER — Encounter: Payer: Self-pay | Admitting: Internal Medicine

## 2020-11-27 ENCOUNTER — Encounter (INDEPENDENT_AMBULATORY_CARE_PROVIDER_SITE_OTHER): Payer: Medicare HMO

## 2020-11-27 ENCOUNTER — Inpatient Hospital Stay: Payer: Medicare HMO

## 2020-11-27 DIAGNOSIS — I4891 Unspecified atrial fibrillation: Secondary | ICD-10-CM | POA: Diagnosis not present

## 2020-11-27 HISTORY — PX: CARDIOVERSION: SHX1299

## 2020-11-27 LAB — KAPPA/LAMBDA LIGHT CHAINS
Kappa free light chain: 895.2 mg/L — ABNORMAL HIGH (ref 3.3–19.4)
Kappa, lambda light chain ratio: 3.67 — ABNORMAL HIGH (ref 0.26–1.65)
Lambda free light chains: 244.2 mg/L — ABNORMAL HIGH (ref 5.7–26.3)

## 2020-11-27 LAB — CBC WITH DIFFERENTIAL/PLATELET
Abs Immature Granulocytes: 0.1 10*3/uL — ABNORMAL HIGH (ref 0.00–0.07)
Basophils Absolute: 0.1 10*3/uL (ref 0.0–0.1)
Basophils Relative: 0 %
Eosinophils Absolute: 0.1 10*3/uL (ref 0.0–0.5)
Eosinophils Relative: 1 %
HCT: 29.7 % — ABNORMAL LOW (ref 36.0–46.0)
Hemoglobin: 9 g/dL — ABNORMAL LOW (ref 12.0–15.0)
Immature Granulocytes: 1 %
Lymphocytes Relative: 8 %
Lymphs Abs: 1 10*3/uL (ref 0.7–4.0)
MCH: 24.8 pg — ABNORMAL LOW (ref 26.0–34.0)
MCHC: 30.3 g/dL (ref 30.0–36.0)
MCV: 81.8 fL (ref 80.0–100.0)
Monocytes Absolute: 1 10*3/uL (ref 0.1–1.0)
Monocytes Relative: 8 %
Neutro Abs: 10.3 10*3/uL — ABNORMAL HIGH (ref 1.7–7.7)
Neutrophils Relative %: 82 %
Platelets: 195 10*3/uL (ref 150–400)
RBC: 3.63 MIL/uL — ABNORMAL LOW (ref 3.87–5.11)
RDW: 16.9 % — ABNORMAL HIGH (ref 11.5–15.5)
WBC: 12.5 10*3/uL — ABNORMAL HIGH (ref 4.0–10.5)
nRBC: 0 % (ref 0.0–0.2)

## 2020-11-27 LAB — PROTEIN ELECTROPHORESIS, SERUM
A/G Ratio: 0.8 (ref 0.7–1.7)
Albumin ELP: 2.8 g/dL — ABNORMAL LOW (ref 2.9–4.4)
Alpha-1-Globulin: 0.3 g/dL (ref 0.0–0.4)
Alpha-2-Globulin: 0.7 g/dL (ref 0.4–1.0)
Beta Globulin: 1 g/dL (ref 0.7–1.3)
Gamma Globulin: 1.3 g/dL (ref 0.4–1.8)
Globulin, Total: 3.3 g/dL (ref 2.2–3.9)
Total Protein ELP: 6.1 g/dL (ref 6.0–8.5)

## 2020-11-27 LAB — COMPREHENSIVE METABOLIC PANEL
ALT: 209 U/L — ABNORMAL HIGH (ref 0–44)
AST: 89 U/L — ABNORMAL HIGH (ref 15–41)
Albumin: 2.3 g/dL — ABNORMAL LOW (ref 3.5–5.0)
Alkaline Phosphatase: 85 U/L (ref 38–126)
Anion gap: 15 (ref 5–15)
BUN: 81 mg/dL — ABNORMAL HIGH (ref 8–23)
CO2: 21 mmol/L — ABNORMAL LOW (ref 22–32)
Calcium: 7.4 mg/dL — ABNORMAL LOW (ref 8.9–10.3)
Chloride: 101 mmol/L (ref 98–111)
Creatinine, Ser: 5.4 mg/dL — ABNORMAL HIGH (ref 0.44–1.00)
GFR, Estimated: 8 mL/min — ABNORMAL LOW (ref >60)
Glucose, Bld: 198 mg/dL — ABNORMAL HIGH (ref 70–99)
Potassium: 4 mmol/L (ref 3.5–5.1)
Sodium: 137 mmol/L (ref 135–145)
Total Bilirubin: 1 mg/dL (ref 0.3–1.2)
Total Protein: 5.4 g/dL — ABNORMAL LOW (ref 6.5–8.1)

## 2020-11-27 LAB — HEMOGLOBIN A1C
Hgb A1c MFr Bld: 7.1 % — ABNORMAL HIGH (ref 4.8–5.6)
Mean Plasma Glucose: 157.07 mg/dL

## 2020-11-27 LAB — GLUCOSE, CAPILLARY
Glucose-Capillary: 199 mg/dL — ABNORMAL HIGH (ref 70–99)
Glucose-Capillary: 152 mg/dL — ABNORMAL HIGH (ref 70–99)

## 2020-11-27 SURGERY — CARDIOVERSION
Anesthesia: General

## 2020-11-27 MED ORDER — PROPOFOL 10 MG/ML IV BOLUS
INTRAVENOUS | Status: DC | PRN
  Administered 2020-11-27: 30 mg via INTRAVENOUS

## 2020-11-27 MED ORDER — INSULIN ASPART 100 UNIT/ML ~~LOC~~ SOLN
0.0000 [IU] | Freq: Three times a day (TID) | SUBCUTANEOUS | Status: DC
  Administered 2020-11-27: 1 [IU] via SUBCUTANEOUS
  Administered 2020-11-27: 2 [IU] via SUBCUTANEOUS
  Administered 2020-12-01: 3 [IU] via SUBCUTANEOUS
  Administered 2020-12-01 – 2020-12-04 (×3): 1 [IU] via SUBCUTANEOUS
  Administered 2020-12-04: 3 [IU] via SUBCUTANEOUS
  Filled 2020-11-27 (×8): qty 1

## 2020-11-27 MED ORDER — PHENYLEPHRINE HCL (PRESSORS) 10 MG/ML IV SOLN
INTRAVENOUS | Status: DC | PRN
  Administered 2020-11-27: 100 ug via INTRAVENOUS

## 2020-11-27 NOTE — Care Management Important Message (Signed)
Important Message  Patient Details  Name: Brooke Hopkins MRN: [id number] Date of Birth: 1951-04-25   Medicare Important Message Given:  Yes     Dannette Barbara 11/27/2020, 12:27 PM

## 2020-11-27 NOTE — Anesthesia Preprocedure Evaluation (Addendum)
Anesthesia Evaluation  Patient identified by MRN, date of birth, ID band Patient awake    Reviewed: Allergy & Precautions, NPO status , Patient's Chart, lab work & pertinent test results  History of Anesthesia Complications Negative for: history of anesthetic complications  Airway Mallampati: III       Dental  (+) Upper Dentures, Lower Dentures   Pulmonary neg sleep apnea, neg COPD, Not current smoker,           Cardiovascular hypertension, Pt. on medications +CHF (LVEF 35%)  (-) Past MI + dysrhythmias Atrial Fibrillation      Neuro/Psych CVA (L sided weakness), Residual Symptoms    GI/Hepatic GERD  Medicated,Elevated enzymes probably due to congestion from afib   Endo/Other  diabetes, Type 2, Insulin Dependent  Renal/GU Renal disease (stones)     Musculoskeletal   Abdominal   Peds  Hematology   Anesthesia Other Findings   Reproductive/Obstetrics                            Anesthesia Physical Anesthesia Plan  ASA: III  Anesthesia Plan: General   Post-op Pain Management:    Induction: Intravenous  PONV Risk Score and Plan: 3 and Propofol infusion and TIVA  Airway Management Planned: Nasal Cannula  Additional Equipment:   Intra-op Plan:   Post-operative Plan:   Informed Consent: I have reviewed the patients History and Physical, chart, labs and discussed the procedure including the risks, benefits and alternatives for the proposed anesthesia with the patient or authorized representative who has indicated his/her understanding and acceptance.       Plan Discussed with:   Anesthesia Plan Comments:         Anesthesia Quick Evaluation

## 2020-11-27 NOTE — Transfer of Care (Signed)
Immediate Anesthesia Transfer of Care Note  Patient: Brooke Hopkins  Procedure(s) Performed: CARDIOVERSION (N/A )  Patient Location: PACU and Cath Lab  Anesthesia Type:General  Level of Consciousness: drowsy  Airway & Oxygen Therapy: Patient Spontanous Breathing and Patient connected to face mask oxygen  Post-op Assessment: Report given to RN and Post -op Vital signs reviewed and stable  Post vital signs: Reviewed and stable  Last Vitals:  Vitals Value Taken Time  BP    Temp    Pulse 32 11/27/20 0749  Resp 25 11/27/20 0749  SpO2 99 % 11/27/20 0749  Vitals shown include unvalidated device data.  Last Pain:  Vitals:   11/27/20 0730  TempSrc: Oral  PainSc: 0-No pain         Complications: No complications documented.

## 2020-11-27 NOTE — Anesthesia Procedure Notes (Signed)
Date/Time: 11/27/2020 7:50 AM Performed by: Johnna Acosta, CRNA Pre-anesthesia Checklist: Patient identified, Emergency Drugs available, Suction available, Patient being monitored and Timeout performed Patient Re-evaluated:Patient Re-evaluated prior to induction Oxygen Delivery Method: Nasal cannula Preoxygenation: Pre-oxygenation with 100% oxygen Induction Type: IV induction

## 2020-11-27 NOTE — Progress Notes (Signed)
Progress Note  Patient Name: Brooke Hopkins Date of Encounter: 11/27/2020  [facility] Cardiologist: Kate Sable, MD   Subjective   Given persistently low blood pressure while she was in A. fib and inability to control heart rate, we proceeded with cardioversion this morning which was successful in restoring sinus rhythm.  She is feeling better overall.  Inpatient Medications    Scheduled Meds: . apixaban  5 mg Oral BID  . clopidogrel  75 mg Oral Daily  . collagenase   Topical Daily  . insulin aspart  0-6 Units Subcutaneous TID WC   Continuous Infusions: . amiodarone 60 mg/hr (11/27/20 1214)  . diltiazem (CARDIZEM) infusion Stopped (11/24/20 1138)  . sodium bicarbonate (isotonic) 150 mEq in D5W 1000 mL infusion 75 mL/hr at 11/27/20 1041   PRN Meds: acetaminophen, HYDROcodone-acetaminophen, ondansetron (ZOFRAN) IV   Vital Signs    Vitals:   11/27/20 0851 11/27/20 0853 11/27/20 1002 11/27/20 1215  BP:  112/88 (!) 125/59 (!) 116/55  Pulse: (!) 59 (!) 59 61 (!) 58  Resp:      Temp:      TempSrc:      SpO2: 99% 97% 97%   Weight:      Height:        Intake/Output Summary (Last 24 hours) at 11/27/2020 1224 Last data filed at 11/27/2020 1040 Gross per 24 hour  Intake 2590.8 ml  Output 0 ml  Net 2590.8 ml   Last 3 Weights 11/27/2020 11/26/2020 11/25/2020  Weight (lbs) 281 lb (No Data) 271 lb 14.4 oz  Weight (kg) 127.461 kg (No Data) 123.333 kg  Some encounter information is confidential and restricted. Go to Review Flowsheets activity to see all data.      Telemetry    Atrial fibrillation with RVR rate 130 up to 140 bpm-converted sinus rhythm after cardioversion personally Reviewed  ECG     - Personally Reviewed  Physical Exam   GEN: No acute distress.  Obese  neck:  Unable to estimate JVD Cardiac:  Regular rate and rhythm, no murmurs, rubs, or gallops.  Respiratory:  Scattered Rales throughout GI: Soft, nontender, non-distended  MS:  1+  anasarca/edema; No deformity. Unable to move her legs Neuro:  Nonfocal  Psych: Normal affect   Labs    High Sensitivity Troponin:   Recent Labs  Lab 11/24/20 0934 11/24/20 1128 11/24/20 1338 11/24/20 1528  TROPONINIHS 194* 190* 205* 212*      Chemistry Recent Labs  Lab 11/24/20 0934 11/24/20 0934 11/25/20 0723 11/26/20 0502 11/27/20 0629  NA 141   < > 140 140 137  K 4.4   < > 4.2 4.1 4.0  CL 107   < > 108 105 101  CO2 19*   < > 19* 19* 21*  GLUCOSE 90   < > 89 165* 198*  BUN 76*   < > 79* 80* 81*  CREATININE 4.57*   < > 4.88* 5.08* 5.40*  CALCIUM 8.0*   < > 7.9* 7.3* 7.4*  PROT 6.3*  --   --  5.6* 5.4*  ALBUMIN 2.8*  --   --  2.4* 2.3*  AST 463*  --   --  160* 89*  ALT 401*  --   --  273* 209*  ALKPHOS 105  --   --  89 85  BILITOT 1.3*  --   --  0.7 1.0  GFRNONAA 10*   < > 9* 9* 8*  ANIONGAP 15   < > 13  16* 15   < > = values in this interval not displayed.     Hematology Recent Labs  Lab 11/25/20 0723 11/26/20 0502 11/27/20 0629  WBC 9.0 10.8* 12.5*  RBC 3.69* 3.78* 3.63*  HGB 9.2* 9.5* 9.0*  HCT 30.8* 31.2* 29.7*  MCV 83.5 82.5 81.8  MCH 24.9* 25.1* 24.8*  MCHC 29.9* 30.4 30.3  RDW 16.9* 17.0* 16.9*  PLT 230 217 195    BNPNo results for input(s): BNP, PROBNP in the last 168 hours.   DDimer No results for input(s): DDIMER in the last 168 hours.   Radiology    CT HEAD WO CONTRAST  Result Date: 11/25/2020 CLINICAL DATA:  Altered mental status. EXAM: CT HEAD WITHOUT CONTRAST TECHNIQUE: Contiguous axial images were obtained from the base of the skull through the vertex without intravenous contrast. COMPARISON:  MR brain dated 09/13/2020 and CT head dated 09/12/2020. FINDINGS: Brain: No evidence of acute infarction, hemorrhage, hydrocephalus, extra-axial collection or mass lesion/mass effect. The previously seen subcentimeter acute infarct is not identified on today's exam. Chronic infarcts of the left external capsule, thalamus, and pons are not  significantly changed. There is mild cerebral volume loss with associated ex vacuo dilatation. Periventricular white matter hypoattenuation likely represents chronic small vessel ischemic disease. Vascular: There are vascular calcifications in the carotid siphons. Skull: Normal. Negative for fracture or focal lesion. Sinuses/Orbits: No acute finding. Other: None. IMPRESSION: No acute intracranial process. Electronically Signed   By: Zerita Boers M.D.   On: 11/25/2020 15:57   ECHOCARDIOGRAM COMPLETE  Result Date: 11/25/2020    ECHOCARDIOGRAM REPORT   Patient Name:   Brooke Hopkins Date of Exam: 11/25/2020 Medical Rec #:  [id number]         Height:       67.0 in Accession #:    [id number]        Weight:       271.9 lb Date of Birth:  1951-11-19         BSA:          2.304 m Patient Age:    69 years          BP:           116/49 mmHg Patient Gender: F                 HR:           154 bpm. Exam Location:  [facility] Procedure: 2D Echo, Cardiac Doppler and Color Doppler Indications:     Atrial Fibrillation 427.31 / I48.91  History:         Patient has prior history of Echocardiogram examinations. Risk                  Factors:Hypertension.  Sonographer:     Alyse Low Roar Referring Phys:  [place] Diagnosing Phys: Ida Rogue MD IMPRESSIONS  1. Left ventricular ejection fraction, by estimation, is 30 to 35%. The left ventricle has moderately decreased function. The left ventricle demonstrates global hypokinesis. Unable to exclude regional wall motion abnormality, select images with hypokinesis of the anterior and anteroseptal wall though not well visualized. Left ventricular diastolic parameters are indeterminate.  2. Right ventricular systolic function is normal. The right ventricular size is normal. There is mildly elevated pulmonary artery systolic pressure. The estimated right ventricular systolic pressure is 28.3 mmHg.  3. Left atrial size was moderately dilated.  4. Right atrial size was  moderately dilated.  5. Tricuspid valve regurgitation  is moderate.  6. Mild mitral valve regurgitation.  7. A small pericardial effusion is present.  8. Rhythm is atrial fibrillation with RVR FINDINGS  Left Ventricle: Left ventricular ejection fraction, by estimation, is 30 to 35%. The left ventricle has moderately decreased function. The left ventricle demonstrates global hypokinesis. The left ventricular internal cavity size was normal in size. There is no left ventricular hypertrophy. Left ventricular diastolic parameters are indeterminate. Right Ventricle: The right ventricular size is normal. No increase in right ventricular wall thickness. Right ventricular systolic function is normal. There is mildly elevated pulmonary artery systolic pressure. The tricuspid regurgitant velocity is 2.97  m/s, and with an assumed right atrial pressure of 5 mmHg, the estimated right ventricular systolic pressure is 16.1 mmHg. Left Atrium: Left atrial size was moderately dilated. Right Atrium: Right atrial size was moderately dilated. Pericardium: A small pericardial effusion is present. Mitral Valve: The mitral valve is normal in structure. Mild mitral valve regurgitation. No evidence of mitral valve stenosis. Tricuspid Valve: The tricuspid valve is normal in structure. Tricuspid valve regurgitation is moderate . No evidence of tricuspid stenosis. Aortic Valve: The aortic valve was not well visualized. Aortic valve regurgitation is not visualized. Mild to moderate aortic valve sclerosis/calcification is present, without any evidence of aortic stenosis. Aortic valve peak gradient measures 8.8 mmHg. Pulmonic Valve: The pulmonic valve was normal in structure. Pulmonic valve regurgitation is trivial. No evidence of pulmonic stenosis. Aorta: The aortic root is normal in size and structure. Venous: The inferior vena cava is normal in size with greater than 50% respiratory variability, suggesting right atrial pressure of 3 mmHg.  IAS/Shunts: No atrial level shunt detected by color flow Doppler.  LEFT VENTRICLE PLAX 2D LVIDd:         3.87 cm  Diastology LVIDs:         3.29 cm  LV e' medial:    7.72 cm/s LV PW:         1.10 cm  LV E/e' medial:  12.8 LV IVS:        1.16 cm  LV e' lateral:   9.36 cm/s LVOT diam:     1.50 cm  LV E/e' lateral: 10.5 LVOT Area:     1.77 cm  RIGHT VENTRICLE RV Mid diam:    4.01 cm LEFT ATRIUM             Index LA diam:        4.30 cm 1.87 cm/m LA Vol (A2C):   71.9 ml 31.20 ml/m LA Vol (A4C):   86.1 ml 37.37 ml/m LA Biplane Vol: 78.9 ml 34.24 ml/m  AORTIC VALVE                PULMONIC VALVE AV Area (Vmax): 0.88 cm    PV Vmax:        0.79 m/s AV Vmax:        148.00 cm/s PV Peak grad:   2.5 mmHg AV Peak Grad:   8.8 mmHg    RVOT Peak grad: 1 mmHg LVOT Vmax:      73.30 cm/s  AORTA Ao Root diam: 2.60 cm MITRAL VALVE               TRICUSPID VALVE MV Area (PHT): 3.12 cm    TR Peak grad:   35.3 mmHg MV Decel Time: 243 msec    TR Vmax:        297.00 cm/s MV E velocity: 98.50 cm/s  SHUNTS                            Systemic Diam: 1.50 cm Ida Rogue MD Electronically signed by Ida Rogue MD Signature Date/Time: 11/25/2020/2:56:01 PM    Final     Cardiac Studies   Echocardiogram yesterday  1. Left ventricular ejection fraction, by estimation, is 30 to 35%. The  left ventricle has moderately decreased function. The left ventricle  demonstrates global hypokinesis. Unable to exclude regional wall motion  abnormality, select images with  hypokinesis of the anterior and anteroseptal wall though not well  visualized. Left ventricular diastolic parameters are indeterminate.  2. Right ventricular systolic function is normal. The right ventricular  size is normal. There is mildly elevated pulmonary artery systolic  pressure. The estimated right ventricular systolic pressure is 50.3 mmHg.  3. Left atrial size was moderately dilated.  4. Right atrial size was moderately dilated.   5. Tricuspid valve regurgitation is moderate.  6. Mild mitral valve regurgitation.  7. A small pericardial effusion is present.  8. Rhythm is atrial fibrillation with RVR   Patient Profile     69 y.o. female w/ a h/o HTN, DM, CVA, PAD s/ L poplitetal and AT stenting, obesity, CKD III, chronic lymphedema, and decubitus ulcers, who presented 11/26 w/ hypoglycemia, Afib w/ RVR, and hypotension req emergent DCCV in the emergency room, normal sinus rhythm restored, converting back to atrial fibrillation November 27,  9 AM  Assessment & Plan   Atrial fibrillation with RVR Status post successful second cardioversion early this morning after she has been on IV amiodarone.  High risk for recurrent A. fib and thus we will keep her on IV amiodarone drip today and if she maintains sinus rhythm, will switch to oral amiodarone tomorrow 400 mg twice daily for 1 week followed by 200 mg once daily after.  Continue anticoagulation with Eliquis.  Elevated troponin Demand ischemia in the setting of rapid atrial fibrillation Aspirin deferred secondary to Eliquis No plan for ischemic work-up at this time  Acute renal failure Likely ATN component, well above her baseline -Nephrology following, numbers still trending upwards  Transaminitis Shock liver, tachycardia hypotension Statin on hold -We will monitor on amiodarone  History of strokes Currently on Eliquis  PAD On Eliquis, previously on aspirin Plavix statin Prior intervention lower extremities Status post prior left pop/AT stenting Followed by vascular surgery Nonhealing wound left lower extremity   For questions or updates, please contact [facility] Please consult [email or web address] for contact info under        Signed, Kathlyn Sacramento, MD  11/27/2020, 12:24 PM

## 2020-11-27 NOTE — CV Procedure (Signed)
Cardioversion note: A standard informed consent was obtained. Timeout was performed. The pads were placed in the anterior posterior fashion. The patient was given propofol by the anesthesia team.  Successful cardioversion was performed with a 200 J. The patient converted to sinus rhythm. Pre-and post EKGs were reviewed. The patient tolerated the procedure with no immediate complications.  Recommendations: Continue IV amiodarone drip for another day then switch to oral as long as she maintains in sinus rhythm.  Continue anticoagulation with Eliquis.

## 2020-11-27 NOTE — Progress Notes (Signed)
Pt only urinated 3 mL. Sent urine down to lab.

## 2020-11-27 NOTE — Progress Notes (Addendum)
[facility]  ROUNDING NOTE   Subjective:  Patient sitting up in bed, consuming breakfast, husband at bedside.  She denies worsening shortness of breath, reports appetite is good.  No nausea or vomiting.  Urine output is very minimal, has external urine drainage system in place.  Objective:  Vital signs in last 24 hours:  Temp:  [97.8 F (36.6 C)-99.5 F (37.5 C)] 98 F (36.7 C) (11/29 0850) Pulse Rate:  [30-156] 66 (11/29 1402) Resp:  [0-37] 19 (11/29 0850) BP: (78-132)/(48-88) 119/61 (11/29 1402) SpO2:  [75 %-100 %] 99 % (11/29 1402) Weight:  [127.5 kg] 127.5 kg (11/29 0434)  Weight change:  Filed Weights   11/24/20 0931 11/25/20 0219 11/27/20 0434  Weight: 127 kg 123.3 kg 127.5 kg    Intake/Output: I/O last 3 completed shifts: In: 2869.6 [P.O.:660; I.V.:2209.6] Out: 0    Intake/Output this shift:  Total I/O In: 1369.1 [P.O.:240; I.V.:1129.1] Out: -   Physical Exam: General:  Sitting up in bed, in no acute distress  Head: Normocephalic, atraumatic. Moist oral mucosal membranes  Eyes:  Sclerae and conjunctivae clear  Lungs:   Lungs clear, diminished at the bases, respirations symmetrical, unlabored  Heart:  S1S2, no rubs or gallops, HR in 60s  Abdomen:  Soft, nontender, obese  Extremities: 2+ peripheral edema. Left lower extremity with dressing clean dry and intact  Neurologic:  Oriented x3, speech clear  Skin: No acute lesions or rashes, left lower leg with dressing Right leg slightly erythematous        Basic Metabolic Panel: Recent Labs  Lab 11/24/20 0934 11/24/20 0934 11/25/20 0723 11/26/20 0502 11/27/20 0629  NA 141  --  140 140 137  K 4.4  --  4.2 4.1 4.0  CL 107  --  108 105 101  CO2 19*  --  19* 19* 21*  GLUCOSE 90  --  89 165* 198*  BUN 76*  --  79* 80* 81*  CREATININE 4.57*  --  4.88* 5.08* 5.40*  CALCIUM 8.0*   < > 7.9* 7.3* 7.4*  MG  --   --  1.8  --   --    < > = values in this interval not displayed.    Liver Function  Tests: Recent Labs  Lab 11/24/20 0934 11/26/20 0502 11/27/20 0629  AST 463* 160* 89*  ALT 401* 273* 209*  ALKPHOS 105 89 85  BILITOT 1.3* 0.7 1.0  PROT 6.3* 5.6* 5.4*  ALBUMIN 2.8* 2.4* 2.3*   No results for input(s): LIPASE, AMYLASE in the last 168 hours. No results for input(s): AMMONIA in the last 168 hours.  CBC: Recent Labs  Lab 11/24/20 0934 11/25/20 0723 11/26/20 0502 11/27/20 0629  WBC 10.1 9.0 10.8* 12.5*  NEUTROABS  --   --  8.6* 10.3*  HGB 10.0* 9.2* 9.5* 9.0*  HCT 34.2* 30.8* 31.2* 29.7*  MCV 84.4 83.5 82.5 81.8  PLT 277 230 217 195    Cardiac Enzymes: No results for input(s): CKTOTAL, CKMB, CKMBINDEX, TROPONINI in the last 168 hours.  BNP: Invalid input(s): POCBNP  CBG: Recent Labs  Lab 11/25/20 0417 11/27/20 1130  GLUCAP 72 199*    Microbiology: Results for orders placed or performed during the hospital encounter of 11/24/20  Resp Panel by RT-PCR (Flu A&B, Covid) Nasopharyngeal Swab     Status: None   Collection Time: 11/24/20  9:35 AM   Specimen: Nasopharyngeal Swab; Nasopharyngeal(NP) swabs in vial transport medium  Result Value Ref Range Status  SARS Coronavirus 2 by RT PCR NEGATIVE NEGATIVE Final    Comment: (NOTE) SARS-CoV-2 target nucleic acids are NOT DETECTED.  The SARS-CoV-2 RNA is generally detectable in upper respiratory specimens during the acute phase of infection. The lowest concentration of SARS-CoV-2 viral copies this assay can detect is 138 copies/mL. A negative result does not preclude SARS-Cov-2 infection and should not be used as the sole basis for treatment or other patient management decisions. A negative result may occur with  improper specimen collection/handling, submission of specimen other than nasopharyngeal swab, presence of viral mutation(s) within the areas targeted by this assay, and inadequate number of viral copies(<138 copies/mL). A negative result must be combined with clinical observations, patient  history, and epidemiological information. The expected result is Negative.  Fact Sheet for Patients:  EntrepreneurPulse.com.au  Fact Sheet for Healthcare Providers:  IncredibleEmployment.be  This test is no t yet approved or cleared by the [place] FDA and  has been authorized for detection and/or diagnosis of SARS-CoV-2 by FDA under an Emergency Use Authorization (EUA). This EUA will remain  in effect (meaning this test can be used) for the duration of the COVID-19 declaration under Section 564(b)(1) of the Act, 21 U.S.C.section 360bbb-3(b)(1), unless the authorization is terminated  or revoked sooner.       Influenza A by PCR NEGATIVE NEGATIVE Final   Influenza B by PCR NEGATIVE NEGATIVE Final    Comment: (NOTE) The Xpert Xpress SARS-CoV-2/FLU/RSV plus assay is intended as an aid in the diagnosis of influenza from Nasopharyngeal swab specimens and should not be used as a sole basis for treatment. Nasal washings and aspirates are unacceptable for Xpert Xpress SARS-CoV-2/FLU/RSV testing.  Fact Sheet for Patients: EntrepreneurPulse.com.au  Fact Sheet for Healthcare Providers: IncredibleEmployment.be  This test is not yet approved or cleared by the [place] FDA and has been authorized for detection and/or diagnosis of SARS-CoV-2 by FDA under an Emergency Use Authorization (EUA). This EUA will remain in effect (meaning this test can be used) for the duration of the COVID-19 declaration under Section 564(b)(1) of the Act, 21 U.S.C. section 360bbb-3(b)(1), unless the authorization is terminated or revoked.  Performed at [facility], [address] [place], [place] [address]     Coagulation Studies: No results for input(s): LABPROT, INR in the last 72 hours.  Urinalysis: No results for input(s): COLORURINE, LABSPEC, PHURINE, GLUCOSEU, HGBUR, BILIRUBINUR, KETONESUR, PROTEINUR,  UROBILINOGEN, NITRITE, LEUKOCYTESUR in the last 72 hours.  Invalid input(s): APPERANCEUR    Imaging: CT HEAD WO CONTRAST  Result Date: 11/25/2020 CLINICAL DATA:  Altered mental status. EXAM: CT HEAD WITHOUT CONTRAST TECHNIQUE: Contiguous axial images were obtained from the base of the skull through the vertex without intravenous contrast. COMPARISON:  MR brain dated 09/13/2020 and CT head dated 09/12/2020. FINDINGS: Brain: No evidence of acute infarction, hemorrhage, hydrocephalus, extra-axial collection or mass lesion/mass effect. The previously seen subcentimeter acute infarct is not identified on today's exam. 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 There is mild cerebral volume loss with associated ex vacuo dilatation. Periventricular white matter hypoattenuation likely represents chronic small vessel ischemic disease. Vascular: There are vascular calcifications in the carotid siphons. Skull: Normal. Negative for fracture or focal lesion. Sinuses/Orbits: No acute finding. Other: None. IMPRESSION: No acute intracranial process. Electronically Signed   By: Zerita Boers M.D.   On: 11/25/2020 15:57   DG Foot 2 Views Left  Result Date: 11/27/2020 CLINICAL DATA:  69 year old female with diabetes  and concern for osteomyelitis. EXAM: LEFT FOOT - 2 VIEW COMPARISON:  Left foot radiograph dated 08/29/2020. FINDINGS: Evaluation is very limited due to advanced osteopenia. No definite acute fracture or dislocation. There is chronic changes of the ankle joint and old healed fracture of the distal tibia. Diffuse soft tissue swelling and subcutaneous edema. No radiopaque foreign object or soft tissue gas. Dressing noted over the plantar heel. IMPRESSION: 1. No definite acute fracture or dislocation. 2. Severe osteopenia with chronic changes of the ankle joint. 3. Diffuse soft tissue swelling and subcutaneous edema. Electronically Signed   By: Anner Crete M.D.   On: 11/27/2020 15:11     Medications:   . amiodarone 60 mg/hr (11/27/20 1323)  . diltiazem (CARDIZEM) infusion Stopped (11/24/20 1138)  . sodium bicarbonate (isotonic) 150 mEq in D5W 1000 mL infusion 75 mL/hr at 11/27/20 1325   . apixaban  5 mg Oral BID  . clopidogrel  75 mg Oral Daily  . collagenase   Topical Daily  . insulin aspart  0-6 Units Subcutaneous TID WC   acetaminophen, HYDROcodone-acetaminophen, ondansetron (ZOFRAN) IV  Assessment/ Plan:  Ms. Brooke Hopkins is a 69 y.o. white female with hypertension, diabetes mellitus type II insulin dependent, peripheral arterial disease, CVA, lymphedema, chronic lower extremity wounds, bed bound who was admitted to [facility] on 11/24/2020 for New onset atrial fibrillation ([place]) [I48.91] Elevated troponin [R77.8] Atrial fibrillation with rapid ventricular response ([place]) [I48.91] Acute renal failure, unspecified acute renal failure type ([place]) [N17.9]  # Acute renal failure with metabolic acidosis on chronic kidney disease stage IIIA:  Baseline creatinine of 1.03, GFR of 55 on 09/13/2020  History of proteinuria, hematuria and glycosuria.   acute renal failure secondary to poor PO intake, nausea, vomiting, acute cardiorenal syndrome and suspect ATN.   Lab Results  Component Value Date   CREATININE 5.40 (H) 11/27/2020   CREATININE 5.08 (H) 11/26/2020   CREATININE 4.88 (H) 11/25/2020    Continue holding  furosemide and lisinopril.  Urine output very minimal Discussed possible need for dialysis,patient and husband willing to proceed if necessary No acute indication for dialysis today Will reassess her tomorrow  # Metabolic acidosis CO2 improved, 21 today Patient is on sodium bicarbonate infusion 75 ml per hour  # Hypotension # new onset atrial fibrillation Blood pressure readings within acceptable range today Required electrocardioversion and IV diltiazem infusion-discontinued.  Continues to be on amiodarone  infusion Cardiology team involved  [place] with renal failure Lab Results  Component Value Date   HGB 9.0 (L) 11/27/2020    #Diabetes mellitus type II with CKD   Lab Results  Component Value Date   HGBA1C 5.3 09/14/2020  Blood glucose readings within acceptable range Patient is on insulin aspart   LOS: 3 Princy Raju 11/29/20213:22 PM  I saw and evaluated the patient and discussed the care with Crosby Oyster, DNP.  I agree with the findings and plan as documented in the note.    Murlean Iba , MD [facility] 11/29/20214:30 PM

## 2020-11-27 NOTE — Telephone Encounter (Signed)
TCM....  Patient is being discharged ( currently admitted )     They are scheduled to see visser 12-07-20 at 930   They were seen for chf  They need to be seen within 7 days    Please call

## 2020-11-27 NOTE — Plan of Care (Signed)
  Problem: Education: Goal: Knowledge of disease or condition will improve Outcome: Progressing   Problem: Activity: Goal: Ability to tolerate increased activity will improve Outcome: Not Progressing   Problem: Cardiac: Goal: Ability to achieve and maintain adequate cardiopulmonary perfusion will improve Outcome: Not Progressing  Plan for Cardioversion today

## 2020-11-27 NOTE — Progress Notes (Signed)
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 Florentina Addison and had 13 X 6 cm wound debrided 11/15/2020  Came to ED because of low blood sugar in the 50s-endorsed multiple areas of nausea vomiting at home noted to be tachycardic in A. fib given metoprolol IV x2 with no improvement started Cardizem gtt. developed hypotension and then was cardioverted in the ED for unstable atrial fibrillation Cardiology was consulted  Also found to have AKI on admission baseline creatinine 1.0-->76/4.5 with CO2 19 anion gap 15 and nephrology consult  Because of hypotension Cardizem and beta-blockers held and patient placed on amiodarone gtt. 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 as per cardiology-cardioverted to sinus rhythm 11/29 Eliquis started this  admission-aspirin Plavix discontinued Rest of planning per cardiology  Left-sided weakness (new) in the setting of prior CVA 01/2015 08/2019 CT head neg for new CVA 11/27 Already on Eliquis would hold given risk of bleeding aspirin and Plavix at least  AKI and ATN likely from A. fib and poor renal perfusion with oliguria Bladder scan 11/27 neg--not obstructed Hold lisinopril 40/Lasix 20 sodium bicarb 150 mEq at 75 cc/H-May need to decrease rate?  Net +3 L this admission weight up from 123-1 27 SPEP light chains another urine studies ordered but she has unfortunately not passed urine May require at least intermittent dialysis-defer to nephrology decision making  chronic sacral and lower extremity wounds followed by Dr. Asa Saunas Picture of wounds performed wound nurse has made recommendations which we will try to follow she is quite tender to movement of either lower extremity specifically the right knee  Leukocytosis White count 10.8-11 > 12.5-underlying sacral decubiti but did not appear infected on evaluation-monitor expectantly if spikes fever will panculture and image her left foot PVD status post multiple stents left side Plavix aspirin now on hold given need for Eliquis reevaluate as an outpatient may be able to resume aspirin  Hypoglycemia on admission CBGs sugars 89-1 98-start sliding scale 11/29 Hold NPH 7030 10 twice daily for now given AKI  History of hypertension with hypotension currently Holding labetalol 100 twice daily clonidine patch Reevaluate need based on recovery   cirrhosis of the liver Possibly secondary to fatty liver versus hypotension and heart failure LFT's continue to trend down slowly Hepatitis panels neg  Total assist at baseline  DVT prophylaxis: Eliquis Code Status: DNR verbalized prior patient at bedside Family Communication: Discussed with  husband in detail who understands the plan understands what cardioversion entailed and hopes for maintaining  sinus-understands may need dialysis Disposition:  Status is: Inpatient  Remains inpatient appropriate because:Persistent severe electrolyte disturbances, Ongoing diagnostic testing needed not appropriate for outpatient work up, Unsafe d/c plan and Inpatient level of care appropriate due to severity of illness   Dispo: The patient is from: Home              Anticipated d/c is to: Home              Anticipated d/c date is: > 3 days              Patient currently is not medically stable to d/c.       Consultants:   Cardiology  Procedures: Cardioversion 11/26 Echocardiogram Repeat cardioversion 200 J 11/29 Dr. Velva Harman  Antimicrobials: None   Subjective:  Overall well-now in sinus rhythm after cardioversion No chest pain no fever Eating at the bedside looks comfortable-states feels better    Objective: Vitals:   11/27/20 0810 11/27/20 0815 11/27/20 0827 11/27/20 0850  BP: 113/66 (!) 108/48 (!) 115/54 116/63  Pulse: (!) 54 (!) 57  60  Resp: (!) 21 (!) 23 (!) 22 19  Temp:    98 F (36.7 C)  TempSrc:    Oral  SpO2: 100% 98%  98%  Weight:      Height:        Intake/Output Summary (Last 24 hours) at 11/27/2020 1007 Last data filed at 11/27/2020 0300 Gross per 24 hour  Intake 1461.67 ml  Output 0 ml  Net 1461.67 ml   Filed Weights   11/24/20 0931 11/25/20 0219 11/27/20 0434  Weight: 127 kg 123.3 kg 127.5 kg    Examination:  Coherent pleasant thick neck no distress CTA B limited exam but no added sound.  Chest wall S1-S2 on monitors seems to help converted to sinus, sinus bradycardia Has bruising on left upper extremity Wounds on lower extremities not evaluated today see pictures from 11/29  Data Reviewed: I have personally reviewed following labs and imaging studies BUNs/creatinine 76/4.5-->79/4.8-->81/5.4 CO2 19-->21 Anion gap 13-->15  Hemoglobin 10.0-->9.2-->9.0 WBC 9.0-->10.8-->12.5 Platelet 195  Radiology Studies: CT HEAD WO CONTRAST  Result  Date: 11/25/2020 CLINICAL DATA:  Altered mental status. EXAM: CT HEAD WITHOUT CONTRAST TECHNIQUE: Contiguous axial images were obtained from the base of the skull through the vertex without intravenous contrast. COMPARISON:  MR brain dated 09/13/2020 and CT head dated 09/12/2020. FINDINGS: Brain: No evidence of acute infarction, hemorrhage, hydrocephalus, extra-axial collection or mass lesion/mass effect. The previously seen subcentimeter acute infarct is not identified on today's exam. 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 There is mild cerebral volume loss with associated ex vacuo dilatation. Periventricular white matter hypoattenuation likely represents chronic small vessel ischemic disease. Vascular: There are vascular calcifications in the carotid siphons. Skull: Normal. Negative for fracture or focal lesion. Sinuses/Orbits: No acute finding. Other: None. IMPRESSION: No acute intracranial process. Electronically Signed   By: Zerita Boers M.D.   On: 11/25/2020 15:57   ECHOCARDIOGRAM COMPLETE  Result Date: 11/25/2020    ECHOCARDIOGRAM REPORT   Patient Name:   PALLAS WAHLERT Date of Exam: 11/25/2020 Medical Rec #:  [id number]         Height:       67.0 in Accession #:    [id number]        Weight:  271.9 lb Date of Birth:  12-18-1951         BSA:          2.304 m Patient Age:    74 years          BP:           116/49 mmHg Patient Gender: F                 HR:           154 bpm. Exam Location:  [facility] Procedure: 2D Echo, Cardiac Doppler and Color Doppler Indications:     Atrial Fibrillation 427.31 / I48.91  History:         Patient has prior history of Echocardiogram examinations. Risk                  Factors:Hypertension.  Sonographer:     Alyse Low Roar Referring Phys:  [place] Diagnosing Phys: Ida Rogue MD IMPRESSIONS  1. Left ventricular ejection fraction, by estimation, is 30 to 35%. The left ventricle has  moderately decreased function. The left ventricle demonstrates global hypokinesis. Unable to exclude regional wall motion abnormality, select images with hypokinesis of the anterior and anteroseptal wall though not well visualized. Left ventricular diastolic parameters are indeterminate.  2. Right ventricular systolic function is normal. The right ventricular size is normal. There is mildly elevated pulmonary artery systolic pressure. The estimated right ventricular systolic pressure is 54.6 mmHg.  3. Left atrial size was moderately dilated.  4. Right atrial size was moderately dilated.  5. Tricuspid valve regurgitation is moderate.  6. Mild mitral valve regurgitation.  7. A small pericardial effusion is present.  8. Rhythm is atrial fibrillation with RVR FINDINGS  Left Ventricle: Left ventricular ejection fraction, by estimation, is 30 to 35%. The left ventricle has moderately decreased function. The left ventricle demonstrates global hypokinesis. The left ventricular internal cavity size was normal in size. There is no left ventricular hypertrophy. Left ventricular diastolic parameters are indeterminate. Right Ventricle: The right ventricular size is normal. No increase in right ventricular wall thickness. Right ventricular systolic function is normal. There is mildly elevated pulmonary artery systolic pressure. The tricuspid regurgitant velocity is 2.97  m/s, and with an assumed right atrial pressure of 5 mmHg, the estimated right ventricular systolic pressure is 50.3 mmHg. Left Atrium: Left atrial size was moderately dilated. Right Atrium: Right atrial size was moderately dilated. Pericardium: A small pericardial effusion is present. Mitral Valve: The mitral valve is normal in structure. Mild mitral valve regurgitation. No evidence of mitral valve stenosis. Tricuspid Valve: The tricuspid valve is normal in structure. Tricuspid valve regurgitation is moderate . No evidence of tricuspid stenosis. Aortic Valve: The  aortic valve was not well visualized. Aortic valve regurgitation is not visualized. Mild to moderate aortic valve sclerosis/calcification is present, without any evidence of aortic stenosis. Aortic valve peak gradient measures 8.8 mmHg. Pulmonic Valve: The pulmonic valve was normal in structure. Pulmonic valve regurgitation is trivial. No evidence of pulmonic stenosis. Aorta: The aortic root is normal in size and structure. Venous: The inferior vena cava is normal in size with greater than 50% respiratory variability, suggesting right atrial pressure of 3 mmHg. IAS/Shunts: No atrial level shunt detected by color flow Doppler.  LEFT VENTRICLE PLAX 2D LVIDd:         3.87 cm  Diastology LVIDs:         3.29 cm  LV e' medial:    7.72 cm/s LV  PW:         1.10 cm  LV E/e' medial:  12.8 LV IVS:        1.16 cm  LV e' lateral:   9.36 cm/s LVOT diam:     1.50 cm  LV E/e' lateral: 10.5 LVOT Area:     1.77 cm  RIGHT VENTRICLE RV Mid diam:    4.01 cm LEFT ATRIUM             Index LA diam:        4.30 cm 1.87 cm/m LA Vol (A2C):   71.9 ml 31.20 ml/m LA Vol (A4C):   86.1 ml 37.37 ml/m LA Biplane Vol: 78.9 ml 34.24 ml/m  AORTIC VALVE                PULMONIC VALVE AV Area (Vmax): 0.88 cm    PV Vmax:        0.79 m/s AV Vmax:        148.00 cm/s PV Peak grad:   2.5 mmHg AV Peak Grad:   8.8 mmHg    RVOT Peak grad: 1 mmHg LVOT Vmax:      73.30 cm/s  AORTA Ao Root diam: 2.60 cm MITRAL VALVE               TRICUSPID VALVE MV Area (PHT): 3.12 cm    TR Peak grad:   35.3 mmHg MV Decel Time: 243 msec    TR Vmax:        297.00 cm/s MV E velocity: 98.50 cm/s                            SHUNTS                            Systemic Diam: 1.50 cm Ida Rogue MD Electronically signed by Ida Rogue MD Signature Date/Time: 11/25/2020/2:56:01 PM    Final      Scheduled Meds: . apixaban  5 mg Oral BID  . clopidogrel  75 mg Oral Daily  . collagenase   Topical Daily   Continuous Infusions: . amiodarone 60 mg/hr (11/27/20 0748)  .  diltiazem (CARDIZEM) infusion Stopped (11/24/20 1138)  . sodium bicarbonate (isotonic) 150 mEq in D5W 1000 mL infusion 75 mL/hr at 11/27/20 0053     LOS: 3 days    Time spent: 50  Nita Sells, MD [facility] To contact the attending provider between 7A-7P or the covering provider during after hours 7P-7A, please log into the web site [email or web address] and access using universal [place] password for that web site. If you do not have the password, please call the hospital operator.  11/27/2020, 10:07 AM

## 2020-11-27 NOTE — Progress Notes (Signed)
Notified Cardiology of Pt's heartrate continuing to stay elevated and the pt being on Amiodarone drip at 33.3 past 6 hours and asked did I need to turn the drip down to 16.67. The answer was to let the drip conitinue at 33.3. Will continue to monitor.

## 2020-11-27 NOTE — Anesthesia Postprocedure Evaluation (Signed)
Anesthesia Post Note  Patient: Brooke Hopkins  Procedure(s) Performed: CARDIOVERSION (N/A )  Patient location during evaluation: Specials Recovery Anesthesia Type: General Level of consciousness: awake and alert Pain management: pain level controlled Vital Signs Assessment: post-procedure vital signs reviewed and stable Respiratory status: spontaneous breathing and respiratory function stable Cardiovascular status: stable Anesthetic complications: no   No complications documented.   Last Vitals:  Vitals:   11/27/20 0810 11/27/20 0815  BP: 113/66 (!) 108/48  Pulse: (!) 54 (!) 57  Resp: (!) 21 (!) 23  Temp:    SpO2: 100% 98%    Last Pain:  Vitals:   11/27/20 0815  TempSrc:   PainSc: 0-No pain                 Autie Vasudevan K

## 2020-11-28 ENCOUNTER — Encounter
Admission: EM | Disposition: A | Discharge status: Hospice | Payer: Self-pay | Source: Home / Self Care | Attending: Internal Medicine

## 2020-11-28 ENCOUNTER — Encounter: Payer: Self-pay | Admitting: Vascular Surgery

## 2020-11-28 ENCOUNTER — Inpatient Hospital Stay: Payer: Medicare HMO

## 2020-11-28 DIAGNOSIS — R778 Other specified abnormalities of plasma proteins: Secondary | ICD-10-CM | POA: Diagnosis not present

## 2020-11-28 DIAGNOSIS — I4891 Unspecified atrial fibrillation: Secondary | ICD-10-CM | POA: Diagnosis not present

## 2020-11-28 DIAGNOSIS — N179 Acute kidney failure, unspecified: Secondary | ICD-10-CM | POA: Diagnosis not present

## 2020-11-28 DIAGNOSIS — I739 Peripheral vascular disease, unspecified: Secondary | ICD-10-CM | POA: Diagnosis not present

## 2020-11-28 DIAGNOSIS — N185 Chronic kidney disease, stage 5: Secondary | ICD-10-CM

## 2020-11-28 DIAGNOSIS — I10 Essential (primary) hypertension: Secondary | ICD-10-CM | POA: Diagnosis not present

## 2020-11-28 HISTORY — PX: TEMPORARY DIALYSIS CATHETER: CATH118312

## 2020-11-28 LAB — CBC WITH DIFFERENTIAL/PLATELET
Abs Immature Granulocytes: 0.11 10*3/uL — ABNORMAL HIGH (ref 0.00–0.07)
Basophils Absolute: 0.1 10*3/uL (ref 0.0–0.1)
Basophils Relative: 0 %
Eosinophils Absolute: 0.1 10*3/uL (ref 0.0–0.5)
Eosinophils Relative: 1 %
HCT: 27.8 % — ABNORMAL LOW (ref 36.0–46.0)
Hemoglobin: 8.6 g/dL — ABNORMAL LOW (ref 12.0–15.0)
Immature Granulocytes: 1 %
Lymphocytes Relative: 12 %
Lymphs Abs: 1.7 10*3/uL (ref 0.7–4.0)
MCH: 24.7 pg — ABNORMAL LOW (ref 26.0–34.0)
MCHC: 30.9 g/dL (ref 30.0–36.0)
MCV: 79.9 fL — ABNORMAL LOW (ref 80.0–100.0)
Monocytes Absolute: 1 10*3/uL (ref 0.1–1.0)
Monocytes Relative: 7 %
Neutro Abs: 10.8 10*3/uL — ABNORMAL HIGH (ref 1.7–7.7)
Neutrophils Relative %: 79 %
Platelets: 196 10*3/uL (ref 150–400)
RBC: 3.48 MIL/uL — ABNORMAL LOW (ref 3.87–5.11)
RDW: 16.8 % — ABNORMAL HIGH (ref 11.5–15.5)
WBC: 13.8 10*3/uL — ABNORMAL HIGH (ref 4.0–10.5)
nRBC: 0 % (ref 0.0–0.2)

## 2020-11-28 LAB — GLUCOSE, CAPILLARY
Glucose-Capillary: 123 mg/dL — ABNORMAL HIGH (ref 70–99)
Glucose-Capillary: 117 mg/dL — ABNORMAL HIGH (ref 70–99)
Glucose-Capillary: 93 mg/dL (ref 70–99)

## 2020-11-28 LAB — COMPREHENSIVE METABOLIC PANEL
ALT: 161 U/L — ABNORMAL HIGH (ref 0–44)
AST: 56 U/L — ABNORMAL HIGH (ref 15–41)
Albumin: 2.1 g/dL — ABNORMAL LOW (ref 3.5–5.0)
Alkaline Phosphatase: 79 U/L (ref 38–126)
Anion gap: 15 (ref 5–15)
BUN: 83 mg/dL — ABNORMAL HIGH (ref 8–23)
CO2: 23 mmol/L (ref 22–32)
Calcium: 7.4 mg/dL — ABNORMAL LOW (ref 8.9–10.3)
Chloride: 98 mmol/L (ref 98–111)
Creatinine, Ser: 5.61 mg/dL — ABNORMAL HIGH (ref 0.44–1.00)
GFR, Estimated: 8 mL/min — ABNORMAL LOW (ref >60)
Glucose, Bld: 125 mg/dL — ABNORMAL HIGH (ref 70–99)
Potassium: 3.9 mmol/L (ref 3.5–5.1)
Sodium: 136 mmol/L (ref 135–145)
Total Bilirubin: 0.6 mg/dL (ref 0.3–1.2)
Total Protein: 5.5 g/dL — ABNORMAL LOW (ref 6.5–8.1)

## 2020-11-28 SURGERY — TEMPORARY DIALYSIS CATHETER
Anesthesia: LOCAL

## 2020-11-28 MED ORDER — CHLORHEXIDINE GLUCONATE CLOTH 2 % EX PADS
6.0000 | MEDICATED_PAD | Freq: Every day | CUTANEOUS | Status: DC
  Administered 2020-11-29 – 2020-12-06 (×8): 6 via TOPICAL

## 2020-11-28 MED ORDER — AMIODARONE HCL 200 MG PO TABS
400.0000 mg | ORAL_TABLET | Freq: Two times a day (BID) | ORAL | Status: DC
  Administered 2020-11-28 – 2020-11-30 (×6): 400 mg via ORAL
  Filled 2020-11-28 (×6): qty 2

## 2020-11-28 SURGICAL SUPPLY — 3 items
COVER PROBE U/S 5X48 (MISCELLANEOUS) ×2 IMPLANT
KIT CATH DIALYSIS TRI 20X13 (CATHETERS) IMPLANT
KIT CATH DIALYSIS TRI 24X13 (CATHETERS) ×2 IMPLANT

## 2020-11-28 NOTE — Consult Note (Signed)
[facility] Vascular Consult Note  MRN : [id number]  Brooke Hopkins is a 69 y.o. (1951/01/22) female who presents with chief complaint of  Chief Complaint  Patient presents with  . Atrial Fibrillation   History of Present Illness:  Patient is admitted to the hospital with tachycardia due to atrial fibrillation with RVR and has developed acute on chronic renal failure.    The nephrology service has decided to initiate dialysis at this time, and we are asked to place a temporary dialysis catheter for immediate dialysis use.   Vascular surgery was consulted by Dr. Candiss Norse for placement of a temporary dialysis catheter.  Current Facility-Administered Medications  Medication Dose Route Frequency Provider Last Rate Last Admin  . acetaminophen (TYLENOL) tablet 650 mg  650 mg Oral Q4H PRN Agbata, Tochukwu, MD      . amiodarone (PACERONE) tablet 400 mg  400 mg Oral BID Kate Sable, MD   400 mg at 11/28/20 0917  . apixaban (ELIQUIS) tablet 5 mg  5 mg Oral BID Kate Sable, MD   5 mg at 11/27/20 2216  . clopidogrel (PLAVIX) tablet 75 mg  75 mg Oral Daily Agbata, Tochukwu, MD   75 mg at 11/27/20 0849  . collagenase (SANTYL) ointment   Topical Daily Nita Sells, MD   Given at 11/28/20 [phone]  . HYDROcodone-acetaminophen (NORCO/VICODIN) 5-325 MG per tablet 1 tablet  1 tablet Oral Q6H PRN Agbata, Tochukwu, MD   1 tablet at 11/27/20 2217  . insulin aspart (novoLOG) injection 0-6 Units  0-6 Units Subcutaneous TID WC Nita Sells, MD   1 Units at 11/27/20 1735  . ondansetron (ZOFRAN) injection 4 mg  4 mg Intravenous Q6H PRN Agbata, Tochukwu, MD   4 mg at 11/27/20 1612   Past Medical History:  Diagnosis Date  . Chronic kidney disease   . Diabetes mellitus   . Hypertension   . Stroke [facility])    Past Surgical History:  Procedure Laterality Date  . ABDOMINAL HYSTERECTOMY    . CARDIOVERSION N/A 11/27/2020   Procedure: CARDIOVERSION;  Surgeon: Wellington Hampshire, MD;  Location: [facility] ORS;  Service: Cardiovascular;  Laterality: N/A;  . CHOLECYSTECTOMY    . HAMMER TOE SURGERY    . KNEE ARTHROSCOPY    . LOWER EXTREMITY ANGIOGRAPHY Left 09/01/2020   Procedure: Lower Extremity Angiography;  Surgeon: Katha Cabal, MD;  Location: [facility];  Service: Cardiovascular;  Laterality: Left;  . LUMBAR DISC SURGERY  11/07/11  . LUMBAR WOUND DEBRIDEMENT  11/27/2011   Procedure: LUMBAR WOUND DEBRIDEMENT;  Surgeon: Eustace Moore;  Location: [facility];  Service: Neurosurgery;  Laterality: N/A;  . TONSILLECTOMY     Social History Social History   Tobacco Use  . Smoking status: Never Smoker  . Smokeless tobacco: Never Used  Vaping Use  . Vaping Use: Never used  Substance Use Topics  . Alcohol use: No  . Drug use: No   Family History Family History  Problem Relation Age of Onset  . CAD Mother   . Hypertension Mother   . COPD Father   . Diabetes Sister   . Hypertension Sister   Denies family history of peripheral artery disease, venous disease or renal disease.  No Known Allergies  REVIEW OF SYSTEMS (Negative unless checked)  Constitutional: [] Weight loss  [] Fever  [] Chills Cardiac: [] Chest pain   [] Chest pressure   [] Palpitations   [] Shortness of breath when laying flat   [] Shortness of breath at  rest   [] Shortness of breath with exertion. Vascular:  [] Pain in legs with walking   [] Pain in legs at rest   [] Pain in legs when laying flat   [] Claudication   [] Pain in feet when walking  [] Pain in feet at rest  [] Pain in feet when laying flat   [] History of DVT   [] Phlebitis   [] Swelling in legs   [] Varicose veins   [] Non-healing ulcers Pulmonary:   [] Uses home oxygen   [] Productive cough   [] Hemoptysis   [] Wheeze  [] COPD   [] Asthma Neurologic:  [] Dizziness  [] Blackouts   [] Seizures   [] History of stroke   [] History of TIA  [] Aphasia   [] Temporary blindness   [] Dysphagia   [] Weakness or numbness in arms   [] Weakness or numbness in  legs Musculoskeletal:  [] Arthritis   [] Joint swelling   [] Joint pain   [] Low back pain Hematologic:  [] Easy bruising  [] Easy bleeding   [] Hypercoagulable state   [] Anemic  [] Hepatitis Gastrointestinal:  [] Blood in stool   [] Vomiting blood  [] Gastroesophageal reflux/heartburn   [] Difficulty swallowing. Genitourinary:  [x] Chronic kidney disease   [] Difficult urination  [] Frequent urination  [] Burning with urination   [] Blood in urine Skin:  [] Rashes   [x] Ulcers   [x] Wounds Psychological:  [] History of anxiety   []  History of major depression.  Physical Examination  Vitals:   11/28/20 0435 11/28/20 0835 11/28/20 1118 11/28/20 1204  BP:  (!) 142/76 (!) 153/66 (!) 165/77  Pulse:  (!) 58 (!) 59 66  Resp:  18 18 19   Temp:  98 F (36.7 C) 98 F (36.7 C) 98.4 F (36.9 C)  TempSrc:  Oral Oral Oral  SpO2:  98% 100% 97%  Weight: 132 kg     Height:       Body mass index is 45.58 kg/m. Gen: WD/WN Head: [place]/AT, No temporalis wasting Ear/Nose/Throat: Hearing grossly intact, nares w/o erythema or drainage Eyes: Sclera non-icteric, conjunctiva clear Neck: Supple, no nuchal rigidity.  No JVD.  Pulmonary:  Good air movement, clear to auscultation bilaterally.  Cardiac: Irregularly irregular, no Murmurs, rubs or gallops. Vascular: Hard to palpate pedal pulses due to edema morbid obesity however extremities are warm. Gastrointestinal: soft, non-tender/non-distended. No guarding/reflex.  Musculoskeletal: M/S 5/5 throughout.  Extremities without ischemic changes.  No deformity or atrophy. Mild edema in the lower extremities bilaterally Neurologic:  Psychiatric: Difficult to assess due to the severity of patient's illness. Dermatologic: Lower extremity and sacral Lymph : No Cervical, Axillary, or Inguinal lymphadenopathy.  CBC Lab Results  Component Value Date   WBC 13.8 (H) 11/28/2020   HGB 8.6 (L) 11/28/2020   HCT 27.8 (L) 11/28/2020   MCV 79.9 (L) 11/28/2020   PLT 196 11/28/2020   BMET     Component Value Date/Time   NA 136 11/28/2020 0559   K 3.9 11/28/2020 0559   CL 98 11/28/2020 0559   CO2 23 11/28/2020 0559   GLUCOSE 125 (H) 11/28/2020 0559   BUN 83 (H) 11/28/2020 0559   CREATININE 5.61 (H) 11/28/2020 0559   CALCIUM 7.4 (L) 11/28/2020 0559   GFRNONAA 8 (L) 11/28/2020 0559   GFRAA >60 09/13/2020 0539   Estimated Creatinine Clearance: 13 mL/min (A) (by C-G formula based on SCr of 5.61 mg/dL (H)).  COAG Lab Results  Component Value Date   INR 0.9 09/12/2020   Radiology CT HEAD WO CONTRAST  Result Date: 11/25/2020 CLINICAL DATA:  Altered mental status. EXAM: CT HEAD WITHOUT CONTRAST TECHNIQUE: Contiguous axial images were obtained  from the base of the skull through the vertex without intravenous contrast. COMPARISON:  MR brain dated 09/13/2020 and CT head dated 09/12/2020. FINDINGS: Brain: No evidence of acute infarction, hemorrhage, hydrocephalus, extra-axial collection or mass lesion/mass effect. The previously seen subcentimeter acute infarct is not identified on today's exam. 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 There is mild cerebral volume loss with associated ex vacuo dilatation. Periventricular white matter hypoattenuation likely represents chronic small vessel ischemic disease. Vascular: There are vascular calcifications in the carotid siphons. Skull: Normal. Negative for fracture or focal lesion. Sinuses/Orbits: No acute finding. Other: None. IMPRESSION: No acute intracranial process. Electronically Signed   By: Zerita Boers M.D.   On: 11/25/2020 15:57   US Abdomen Complete  Result Date: 11/24/2020 CLINICAL DATA:  Transaminitis. EXAM: ABDOMEN ULTRASOUND COMPLETE COMPARISON:  None. FINDINGS: Gallbladder: Surgically removed. Common bile duct: Diameter: 0.4 cm Liver: No focal lesion identified. Echogenicity is within normal limits. Liver contour is mildly nodular. Portal vein is patent on color Doppler  imaging with normal direction of blood flow towards the liver. IVC: No abnormality visualized. Pancreas: Visualized portion unremarkable. Spleen: Size and appearance within normal limits. Right Kidney: Length: 9.3 cm. Echogenicity within normal limits. No mass or hydronephrosis visualized. Left Kidney: Length: 9.7 cm. Echogenicity within normal limits. No mass or hydronephrosis visualized. Abdominal aorta: Mid and distal abdominal aorta cannot be visualized due to bowel gas. Other findings: Right pleural effusion. IMPRESSION: 1. Liver contour is mildly nodular and concerning for cirrhosis. No biliary dilatation. Cholecystectomy. 2. Right pleural effusion. Electronically Signed   By: Markus Daft M.D.   On: 11/24/2020 15:25   DG Chest Portable 1 View  Result Date: 11/24/2020 CLINICAL DATA:  New onset atrial fibrillation.  Shortness of breath. EXAM: PORTABLE CHEST 1 VIEW COMPARISON:  Chest x-ray dated November 11, 2016. FINDINGS: The patient is rotated to the right, limiting evaluation. Stable cardiomediastinal silhouette with heart size at the upper limits of normal. Pulmonary vascular congestion. Possible small right pleural effusion. No consolidation or pneumothorax. No acute osseous abnormality. IMPRESSION: 1. Limited study due to patient rotation. Pulmonary vascular congestion and possible small right pleural effusion. Electronically Signed   By: Titus Dubin M.D.   On: 11/24/2020 09:51   DG Foot 2 Views Left  Result Date: 11/27/2020 CLINICAL DATA:  69 year old female with diabetes and concern for osteomyelitis. EXAM: LEFT FOOT - 2 VIEW COMPARISON:  Left foot radiograph dated 08/29/2020. FINDINGS: Evaluation is very limited due to advanced osteopenia. No definite acute fracture or dislocation. There is chronic changes of the ankle joint and old healed fracture of the distal tibia. Diffuse soft tissue swelling and subcutaneous edema. No radiopaque foreign object or soft tissue gas. Dressing noted over  the plantar heel. IMPRESSION: 1. No definite acute fracture or dislocation. 2. Severe osteopenia with chronic changes of the ankle joint. 3. Diffuse soft tissue swelling and subcutaneous edema. Electronically Signed   By: Anner Crete M.D.   On: 11/27/2020 15:11   ECHOCARDIOGRAM COMPLETE  Result Date: 11/25/2020    ECHOCARDIOGRAM REPORT   Patient Name:   Brooke Hopkins Date of Exam: 11/25/2020 Medical Rec #:  [id number]         Height:       67.0 in Accession #:    [id number]        Weight:       271.9 lb Date of Birth:  [birth year]  BSA:          2.304 m Patient Age:    6 years          BP:           116/49 mmHg Patient Gender: F                 HR:           154 bpm. Exam Location:  [facility] Procedure: 2D Echo, Cardiac Doppler and Color Doppler Indications:     Atrial Fibrillation 427.31 / I48.91  History:         Patient has prior history of Echocardiogram examinations. Risk                  Factors:Hypertension.  Sonographer:     Alyse Low Roar Referring Phys:  [place] Diagnosing Phys: Ida Rogue MD IMPRESSIONS  1. Left ventricular ejection fraction, by estimation, is 30 to 35%. The left ventricle has moderately decreased function. The left ventricle demonstrates global hypokinesis. Unable to exclude regional wall motion abnormality, select images with hypokinesis of the anterior and anteroseptal wall though not well visualized. Left ventricular diastolic parameters are indeterminate.  2. Right ventricular systolic function is normal. The right ventricular size is normal. There is mildly elevated pulmonary artery systolic pressure. The estimated right ventricular systolic pressure is 93.2 mmHg.  3. Left atrial size was moderately dilated.  4. Right atrial size was moderately dilated.  5. Tricuspid valve regurgitation is moderate.  6. Mild mitral valve regurgitation.  7. A small pericardial effusion is present.  8. Rhythm is atrial fibrillation with RVR FINDINGS  Left  Ventricle: Left ventricular ejection fraction, by estimation, is 30 to 35%. The left ventricle has moderately decreased function. The left ventricle demonstrates global hypokinesis. The left ventricular internal cavity size was normal in size. There is no left ventricular hypertrophy. Left ventricular diastolic parameters are indeterminate. Right Ventricle: The right ventricular size is normal. No increase in right ventricular wall thickness. Right ventricular systolic function is normal. There is mildly elevated pulmonary artery systolic pressure. The tricuspid regurgitant velocity is 2.97  m/s, and with an assumed right atrial pressure of 5 mmHg, the estimated right ventricular systolic pressure is 35.5 mmHg. Left Atrium: Left atrial size was moderately dilated. Right Atrium: Right atrial size was moderately dilated. Pericardium: A small pericardial effusion is present. Mitral Valve: The mitral valve is normal in structure. Mild mitral valve regurgitation. No evidence of mitral valve stenosis. Tricuspid Valve: The tricuspid valve is normal in structure. Tricuspid valve regurgitation is moderate . No evidence of tricuspid stenosis. Aortic Valve: The aortic valve was not well visualized. Aortic valve regurgitation is not visualized. Mild to moderate aortic valve sclerosis/calcification is present, without any evidence of aortic stenosis. Aortic valve peak gradient measures 8.8 mmHg. Pulmonic Valve: The pulmonic valve was normal in structure. Pulmonic valve regurgitation is trivial. No evidence of pulmonic stenosis. Aorta: The aortic root is normal in size and structure. Venous: The inferior vena cava is normal in size with greater than 50% respiratory variability, suggesting right atrial pressure of 3 mmHg. IAS/Shunts: No atrial level shunt detected by color flow Doppler.  LEFT VENTRICLE PLAX 2D LVIDd:         3.87 cm  Diastology LVIDs:         3.29 cm  LV e' medial:    7.72 cm/s LV PW:         1.10 cm  LV E/e'  medial:  12.8 LV IVS:        1.16 cm  LV e' lateral:   9.36 cm/s LVOT diam:     1.50 cm  LV E/e' lateral: 10.5 LVOT Area:     1.77 cm  RIGHT VENTRICLE RV Mid diam:    4.01 cm LEFT ATRIUM             Index LA diam:        4.30 cm 1.87 cm/m LA Vol (A2C):   71.9 ml 31.20 ml/m LA Vol (A4C):   86.1 ml 37.37 ml/m LA Biplane Vol: 78.9 ml 34.24 ml/m  AORTIC VALVE                PULMONIC VALVE AV Area (Vmax): 0.88 cm    PV Vmax:        0.79 m/s AV Vmax:        148.00 cm/s PV Peak grad:   2.5 mmHg AV Peak Grad:   8.8 mmHg    RVOT Peak grad: 1 mmHg LVOT Vmax:      73.30 cm/s  AORTA Ao Root diam: 2.60 cm MITRAL VALVE               TRICUSPID VALVE MV Area (PHT): 3.12 cm    TR Peak grad:   35.3 mmHg MV Decel Time: 243 msec    TR Vmax:        297.00 cm/s MV E velocity: 98.50 cm/s                            SHUNTS                            Systemic Diam: 1.50 cm Ida Rogue MD Electronically signed by Ida Rogue MD Signature Date/Time: 11/25/2020/2:56:01 PM    Final    Assessment/Plan Patient is admitted to the hospital with tachycardia due to atrial fibrillation with RVR and has developed acute on chronic renal failure.    1.  Acute on chronic renal failure: We will proceed with temporary dialysis catheter placement at this time.  Risks and benefits discussed with patient and/or family, and the catheter will be placed to allow immediate initiation of dialysis.  If the patient's renal function does not improve throughout the hospital course, we will be happy to place a tunneled dialysis catheter for long term use prior to discharge.   2.  Peripheral artery disease: Last seen in our office on October 25, 2020 At that time, noninvasive studies showing marginal evidence of healing ability. Patient was referred to wound clinic for local wound care with discussion of possible amputation if wounds do not heal. On aspirin, Plavix and statin for medical management   3.  A. fib with RVR: Converted to sinus  rhythm Cardiology following  Discussed with Dr. Francene Castle, PA-C  11/28/2020 12:42 PM

## 2020-11-28 NOTE — Progress Notes (Signed)
Progress Note  Patient Name: Brooke Hopkins Date of Encounter: 11/28/2020  [facility] Cardiologist: Kate Sable, MD   Subjective   Doing okay, no acute events overnight.  Maintaining sinus rhythm on amiodarone drip.  Inpatient Medications    Scheduled Meds: . apixaban  5 mg Oral BID  . clopidogrel  75 mg Oral Daily  . collagenase   Topical Daily  . insulin aspart  0-6 Units Subcutaneous TID WC   Continuous Infusions: . amiodarone 60 mg/hr (11/28/20 0843)  . diltiazem (CARDIZEM) infusion Stopped (11/24/20 1138)   PRN Meds: acetaminophen, HYDROcodone-acetaminophen, ondansetron (ZOFRAN) IV   Vital Signs    Vitals:   11/27/20 1956 11/28/20 0402 11/28/20 0435 11/28/20 0835  BP: 139/70 (!) 125/58  (!) 142/76  Pulse: 60 (!) 57  (!) 58  Resp: 19 18  18   Temp: 98.4 F (36.9 C) 98.3 F (36.8 C)  98 F (36.7 C)  TempSrc: Oral Oral  Oral  SpO2: 98% 96%  98%  Weight:   132 kg   Height:        Intake/Output Summary (Last 24 hours) at 11/28/2020 0858 Last data filed at 11/27/2020 2159 Gross per 24 hour  Intake 1729.13 ml  Output 0 ml  Net 1729.13 ml   Last 3 Weights 11/28/2020 11/27/2020 11/26/2020  Weight (lbs) 291 lb 281 lb (No Data)  Weight (kg) 131.997 kg 127.461 kg (No Data)  Some encounter information is confidential and restricted. Go to Review Flowsheets activity to see all data.      Telemetry    Sinus bradycardia, heart rate 53- Personally Reviewed  ECG    No new tracing- Personally Reviewed  Physical Exam   GEN: No acute distress, morbidly obese Neck: No JVD Cardiac:  Distant heart sounds, regular Respiratory:  Points with Tory effort, decreased breath sounds at bases GI: Soft, nontender, distended  MS:  Lymphedema, left leg wound dressing noted. Neuro:  Nonfocal , generalized weakness Psych: Normal affect   Labs    High Sensitivity Troponin:   Recent Labs  Lab 11/24/20 0934 11/24/20 1128 11/24/20 1338 11/24/20 1528   TROPONINIHS 194* 190* 205* 212*      Chemistry Recent Labs  Lab 11/26/20 0502 11/27/20 0629 11/28/20 0559  NA 140 137 136  K 4.1 4.0 3.9  CL 105 101 98  CO2 19* 21* 23  GLUCOSE 165* 198* 125*  BUN 80* 81* 83*  CREATININE 5.08* 5.40* 5.61*  CALCIUM 7.3* 7.4* 7.4*  PROT 5.6* 5.4* 5.5*  ALBUMIN 2.4* 2.3* 2.1*  AST 160* 89* 56*  ALT 273* 209* 161*  ALKPHOS 89 85 79  BILITOT 0.7 1.0 0.6  GFRNONAA 9* 8* 8*  ANIONGAP 16* 15 15     Hematology Recent Labs  Lab 11/26/20 0502 11/27/20 0629 11/28/20 0559  WBC 10.8* 12.5* 13.8*  RBC 3.78* 3.63* 3.48*  HGB 9.5* 9.0* 8.6*  HCT 31.2* 29.7* 27.8*  MCV 82.5 81.8 79.9*  MCH 25.1* 24.8* 24.7*  MCHC 30.4 30.3 30.9  RDW 17.0* 16.9* 16.8*  PLT 217 195 196    BNPNo results for input(s): BNP, PROBNP in the last 168 hours.   DDimer No results for input(s): DDIMER in the last 168 hours.   Radiology    DG Foot 2 Views Left  Result Date: 11/27/2020 CLINICAL DATA:  69 year old female with diabetes and concern for osteomyelitis. EXAM: LEFT FOOT - 2 VIEW COMPARISON:  Left foot radiograph dated 08/29/2020. FINDINGS: Evaluation is very limited due to advanced  osteopenia. No definite acute fracture or dislocation. There is chronic changes of the ankle joint and old healed fracture of the distal tibia. Diffuse soft tissue swelling and subcutaneous edema. No radiopaque foreign object or soft tissue gas. Dressing noted over the plantar heel. IMPRESSION: 1. No definite acute fracture or dislocation. 2. Severe osteopenia with chronic changes of the ankle joint. 3. Diffuse soft tissue swelling and subcutaneous edema. Electronically Signed   By: Anner Crete M.D.   On: 11/27/2020 15:11    Cardiac Studies   Echo 08/2020 1. Left ventricular ejection fraction, by estimation, is 60 to 65%. The  left ventricle has normal function. The left ventricle has no regional  wall motion abnormalities. Left ventricular diastolic function could not  be  evaluated.  2. Right ventricular systolic function is normal. The right ventricular  size is normal.  3. The mitral valve is grossly normal. No evidence of mitral valve  regurgitation.  4. Tricuspid valve regurgitation not assessed.  5. The aortic valve was not well visualized. Aortic valve regurgitation  is not visualized.  Patient Profile     69 y.o. female history of hypertension, CVA, PAD, CKD, morbid obesity, bedbound presenting with low blood sugars, found to have atrial fibrillation with rapid ventricular response.  Status post DC cardioversion x2, maintaining sinus rhythm on amiodarone drip.  Assessment & Plan    1.  Paroxysmal atrial fibrillation -CHA2DS2-VASc of 7 -Maintaining sinus rhythm on amiodarone drip -Start p.o. amiodarone 400 mg twice daily -Continue Eliquis 5 mg twice daily  2.  Hypertension -BP controlled on amiodarone -PTA meds for now.  If blood pressure becomes elevated, can restart home medications.   3.  CVA -Lipitor, Plavix, Eliquis  4.  PAD status post peripheral stent -Lipitor, Plavix  5.  CKD -Acute on chronic renal dysfunction  -nephrology following    Signed, Kate Sable, MD  11/28/2020, 8:58 AM

## 2020-11-28 NOTE — Progress Notes (Signed)
[facility]  ROUNDING NOTE   Subjective:  Patient has worsening renal function with very minimal urine output. We are planning for initiating dialysis treatments.Patient and husband in agreement with the plan.Vascular team consulted for catheter placement.  Objective:  Vital signs in last 24 hours:  Temp:  [98 F (36.7 C)-98.5 F (36.9 C)] 98.5 F (36.9 C) (11/30 1600) Pulse Rate:  [57-69] 67 (11/30 1730) Resp:  [17-24] 19 (11/30 1730) BP: (125-165)/(51-81) 140/60 (11/30 1730) SpO2:  [93 %-100 %] 96 % (11/30 1500) Weight:  [132 kg] 132 kg (11/30 0435)  Weight change: 4.536 kg Filed Weights   11/25/20 0219 11/27/20 0434 11/28/20 0435  Weight: 123.3 kg 127.5 kg 132 kg    Intake/Output: I/O last 3 completed shifts: In: 2519 [P.O.:600; I.V.:1919] Out: 0    Intake/Output this shift:  No intake/output data recorded.  Physical Exam: General:  Resting in bed, appears lethargic  Head:  Moist oral mucosal membranes  Eyes:  Sclerae and conjunctivae clear  Lungs:    respirations symmetrical, unlabored,lungs diminished at the bases  Heart:  S1S2,no rubs or gallops  Abdomen:  Soft, nontender, obese  Extremities: 2+ peripheral edema. Left lower extremity with dressing clean dry and intact  Neurologic:  Awake,alert,oriented,flat affect  Skin: No acute lesions or rashes, left lower leg with dressing Right leg swollen and erythematous        Basic Metabolic Panel: Recent Labs  Lab 11/24/20 0934 11/24/20 0934 11/25/20 0723 11/25/20 0723 11/26/20 0502 11/27/20 0629 11/28/20 0559  NA 141  --  140  --  140 137 136  K 4.4  --  4.2  --  4.1 4.0 3.9  CL 107  --  108  --  105 101 98  CO2 19*  --  19*  --  19* 21* 23  GLUCOSE 90  --  89  --  165* 198* 125*  BUN 76*  --  79*  --  80* 81* 83*  CREATININE 4.57*  --  4.88*  --  5.08* 5.40* 5.61*  CALCIUM 8.0*   < > 7.9*   < > 7.3* 7.4* 7.4*  MG  --   --  1.8  --   --   --   --    < > = values in this interval not  displayed.    Liver Function Tests: Recent Labs  Lab 11/24/20 0934 11/26/20 0502 11/27/20 0629 11/28/20 0559  AST 463* 160* 89* 56*  ALT 401* 273* 209* 161*  ALKPHOS 105 89 85 79  BILITOT 1.3* 0.7 1.0 0.6  PROT 6.3* 5.6* 5.4* 5.5*  ALBUMIN 2.8* 2.4* 2.3* 2.1*   No results for input(s): LIPASE, AMYLASE in the last 168 hours. No results for input(s): AMMONIA in the last 168 hours.  CBC: Recent Labs  Lab 11/24/20 0934 11/25/20 0723 11/26/20 0502 11/27/20 0629 11/28/20 0559  WBC 10.1 9.0 10.8* 12.5* 13.8*  NEUTROABS  --   --  8.6* 10.3* 10.8*  HGB 10.0* 9.2* 9.5* 9.0* 8.6*  HCT 34.2* 30.8* 31.2* 29.7* 27.8*  MCV 84.4 83.5 82.5 81.8 79.9*  PLT 277 230 217 195 196    Cardiac Enzymes: No results for input(s): CKTOTAL, CKMB, CKMBINDEX, TROPONINI in the last 168 hours.  BNP: Invalid input(s): POCBNP  CBG: Recent Labs  Lab 11/25/20 0417 11/27/20 1130 11/27/20 1730 11/28/20 0840 11/28/20 1116  GLUCAP 72 199* 152* 123* 117*    Microbiology: Results for orders placed or performed during the hospital encounter of  11/24/20  Resp Panel by RT-PCR (Flu A&B, Covid) Nasopharyngeal Swab     Status: None   Collection Time: 11/24/20  9:35 AM   Specimen: Nasopharyngeal Swab; Nasopharyngeal(NP) swabs in vial transport medium  Result Value Ref Range Status   SARS Coronavirus 2 by RT PCR NEGATIVE NEGATIVE Final    Comment: (NOTE) SARS-CoV-2 target nucleic acids are NOT DETECTED.  The SARS-CoV-2 RNA is generally detectable in upper respiratory specimens during the acute phase of infection. The lowest concentration of SARS-CoV-2 viral copies this assay can detect is 138 copies/mL. A negative result does not preclude SARS-Cov-2 infection and should not be used as the sole basis for treatment or other patient management decisions. A negative result may occur with  improper specimen collection/handling, submission of specimen other than nasopharyngeal swab, presence of viral  mutation(s) within the areas targeted by this assay, and inadequate number of viral copies(<138 copies/mL). A negative result must be combined with clinical observations, patient history, and epidemiological information. The expected result is Negative.  Fact Sheet for Patients:  EntrepreneurPulse.com.au  Fact Sheet for Healthcare Providers:  IncredibleEmployment.be  This test is no t yet approved or cleared by the [place] FDA and  has been authorized for detection and/or diagnosis of SARS-CoV-2 by FDA under an Emergency Use Authorization (EUA). This EUA will remain  in effect (meaning this test can be used) for the duration of the COVID-19 declaration under Section 564(b)(1) of the Act, 21 U.S.C.section 360bbb-3(b)(1), unless the authorization is terminated  or revoked sooner.       Influenza A by PCR NEGATIVE NEGATIVE Final   Influenza B by PCR NEGATIVE NEGATIVE Final    Comment: (NOTE) The Xpert Xpress SARS-CoV-2/FLU/RSV plus assay is intended as an aid in the diagnosis of influenza from Nasopharyngeal swab specimens and should not be used as a sole basis for treatment. Nasal washings and aspirates are unacceptable for Xpert Xpress SARS-CoV-2/FLU/RSV testing.  Fact Sheet for Patients: EntrepreneurPulse.com.au  Fact Sheet for Healthcare Providers: IncredibleEmployment.be  This test is not yet approved or cleared by the [place] FDA and has been authorized for detection and/or diagnosis of SARS-CoV-2 by FDA under an Emergency Use Authorization (EUA). This EUA will remain in effect (meaning this test can be used) for the duration of the COVID-19 declaration under Section 564(b)(1) of the Act, 21 U.S.C. section 360bbb-3(b)(1), unless the authorization is terminated or revoked.  Performed at [facility], [address] [place], [place] [address]     Coagulation Studies: No  results for input(s): LABPROT, INR in the last 72 hours.  Urinalysis: No results for input(s): COLORURINE, LABSPEC, PHURINE, GLUCOSEU, HGBUR, BILIRUBINUR, KETONESUR, PROTEINUR, UROBILINOGEN, NITRITE, LEUKOCYTESUR in the last 72 hours.  Invalid input(s): APPERANCEUR    Imaging: PERIPHERAL VASCULAR CATHETERIZATION  Result Date: 11/28/2020 See Op Note  DG Chest Port 1 View  Result Date: 11/28/2020 CLINICAL DATA:  Central line placement. EXAM: PORTABLE CHEST 1 VIEW COMPARISON:  November 24, 2020. FINDINGS: Stable cardiomegaly. Interval placement of left internal jugular catheter with distal tip in expected position of the right atrium. No pneumothorax is noted. Mild bibasilar subsegmental atelectasis is noted. Small right pleural effusion may be present. Bony thorax is unremarkable. IMPRESSION: Interval placement of left internal jugular catheter with distal tip in expected position of the right atrium. No pneumothorax is noted. Mild bibasilar subsegmental atelectasis is noted. Electronically Signed   By: Marijo Conception M.D.   On: 11/28/2020 14:55   DG Foot 2 Views Left  Result Date: 11/27/2020 CLINICAL DATA:  68 year old female with diabetes and concern for osteomyelitis. EXAM: LEFT FOOT - 2 VIEW COMPARISON:  Left foot radiograph dated 08/29/2020. FINDINGS: Evaluation is very limited due to advanced osteopenia. No definite acute fracture or dislocation. There is chronic changes of the ankle joint and old healed fracture of the distal tibia. Diffuse soft tissue swelling and subcutaneous edema. No radiopaque foreign object or soft tissue gas. Dressing noted over the plantar heel. IMPRESSION: 1. No definite acute fracture or dislocation. 2. Severe osteopenia with chronic changes of the ankle joint. 3. Diffuse soft tissue swelling and subcutaneous edema. Electronically Signed   By: Anner Crete M.D.   On: 11/27/2020 15:11     Medications:    . amiodarone  400 mg Oral BID  . apixaban  5 mg  Oral BID  . [START ON 11/29/2020] Chlorhexidine Gluconate Cloth  6 each Topical Q0600  . clopidogrel  75 mg Oral Daily  . collagenase   Topical Daily  . insulin aspart  0-6 Units Subcutaneous TID WC   acetaminophen, HYDROcodone-acetaminophen, ondansetron (ZOFRAN) IV  Assessment/ Plan:  Ms. Brooke Hopkins is a 69 y.o. white female with hypertension, diabetes mellitus type II insulin dependent, peripheral arterial disease, CVA, lymphedema, chronic lower extremity wounds, bed bound who was admitted to [facility] on 11/24/2020 for New onset atrial fibrillation ([place]) [I48.91] Elevated troponin [R77.8] Atrial fibrillation with rapid ventricular response ([place]) [I48.91] Acute renal failure, unspecified acute renal failure type ([place]) [N17.9]  # Acute renal failure with metabolic acidosis on chronic kidney disease stage IIIA:  Baseline creatinine of 1.03, GFR of 55 on 09/13/2020  History of proteinuria, hematuria and glycosuria.   acute renal failure secondary to poor PO intake, nausea, vomiting, acute cardiorenal syndrome and suspect ATN.   Lab Results  Component Value Date   CREATININE 5.61 (H) 11/28/2020   CREATININE 5.40 (H) 11/27/2020   CREATININE 5.08 (H) 11/26/2020    Renal function gradually worsening  Anuric Will plan for dialysis today Vascular team consulted for temporary dialysis access placement Concern about heel infection. Defer permcath placement until clear from infection standpoint  #Anemia with renal failure Lab Results  Component Value Date   HGB 8.6 (L) 11/28/2020  Will consider Epogen with dialysis treatments  #Diabetes mellitus type II with CKD   Lab Results  Component Value Date   HGBA1C 7.1 (H) 11/27/2020  Continue current antihyperglycemic management per primary team recommendations  #Atrial fibrillation  Patient is on Amiodarone Cardiology team involved in the care   LOS: [place] 11/30/20215:38 PM   Patient was seen and evaluated with Crosby Oyster, NP. She assisted with transcription of this note.

## 2020-11-28 NOTE — Progress Notes (Signed)
[facility]  ROUNDING NOTE   Subjective:  Patient has worsening renal function with very minimal urine output. We are planning for initiating dialysis treatments.Patient and husband in agreement with the plan.Vascular team consulted for catheter placement.  Objective:  Vital signs in last 24 hours:  Temp:  [98 F (36.7 C)-98.4 F (36.9 C)] 98.4 F (36.9 C) (11/30 1204) Pulse Rate:  [57-69] 67 (11/30 1500) Resp:  [18-21] 18 (11/30 1500) BP: (125-165)/(55-81) 137/81 (11/30 1500) SpO2:  [93 %-100 %] 96 % (11/30 1500) Weight:  [132 kg] 132 kg (11/30 0435)  Weight change: 4.536 kg Filed Weights   11/25/20 0219 11/27/20 0434 11/28/20 0435  Weight: 123.3 kg 127.5 kg 132 kg    Intake/Output: I/O last 3 completed shifts: In: 2519 [P.O.:600; I.V.:1919] Out: 0    Intake/Output this shift:  No intake/output data recorded.  Physical Exam: General:  Resting in bed, appears lethargic  Head:  Moist oral mucosal membranes  Eyes:  Sclerae and conjunctivae clear  Lungs:    respirations symmetrical, unlabored,lungs diminished at the bases  Heart:  S1S2,no rubs or gallops  Abdomen:  Soft, nontender, obese  Extremities: 2+ peripheral edema. Left lower extremity with dressing clean dry and intact  Neurologic:  Awake,alert,oriented,flat affect  Skin: No acute lesions or rashes, left lower leg with dressing Right leg swollen and erythematous        Basic Metabolic Panel: Recent Labs  Lab 11/24/20 0934 11/24/20 0934 11/25/20 0723 11/25/20 0723 11/26/20 0502 11/27/20 0629 11/28/20 0559  NA 141  --  140  --  140 137 136  K 4.4  --  4.2  --  4.1 4.0 3.9  CL 107  --  108  --  105 101 98  CO2 19*  --  19*  --  19* 21* 23  GLUCOSE 90  --  89  --  165* 198* 125*  BUN 76*  --  79*  --  80* 81* 83*  CREATININE 4.57*  --  4.88*  --  5.08* 5.40* 5.61*  CALCIUM 8.0*   < > 7.9*   < > 7.3* 7.4* 7.4*  MG  --   --  1.8  --   --   --   --    < > = values in this interval not  displayed.    Liver Function Tests: Recent Labs  Lab 11/24/20 0934 11/26/20 0502 11/27/20 0629 11/28/20 0559  AST 463* 160* 89* 56*  ALT 401* 273* 209* 161*  ALKPHOS 105 89 85 79  BILITOT 1.3* 0.7 1.0 0.6  PROT 6.3* 5.6* 5.4* 5.5*  ALBUMIN 2.8* 2.4* 2.3* 2.1*   No results for input(s): LIPASE, AMYLASE in the last 168 hours. No results for input(s): AMMONIA in the last 168 hours.  CBC: Recent Labs  Lab 11/24/20 0934 11/25/20 0723 11/26/20 0502 11/27/20 0629 11/28/20 0559  WBC 10.1 9.0 10.8* 12.5* 13.8*  NEUTROABS  --   --  8.6* 10.3* 10.8*  HGB 10.0* 9.2* 9.5* 9.0* 8.6*  HCT 34.2* 30.8* 31.2* 29.7* 27.8*  MCV 84.4 83.5 82.5 81.8 79.9*  PLT 277 230 217 195 196    Cardiac Enzymes: No results for input(s): CKTOTAL, CKMB, CKMBINDEX, TROPONINI in the last 168 hours.  BNP: Invalid input(s): POCBNP  CBG: Recent Labs  Lab 11/25/20 0417 11/27/20 1130 11/27/20 1730 11/28/20 0840 11/28/20 1116  GLUCAP 72 199* 152* 123* 117*    Microbiology: Results for orders placed or performed during the hospital encounter of  11/24/20  Resp Panel by RT-PCR (Flu A&B, Covid) Nasopharyngeal Swab     Status: None   Collection Time: 11/24/20  9:35 AM   Specimen: Nasopharyngeal Swab; Nasopharyngeal(NP) swabs in vial transport medium  Result Value Ref Range Status   SARS Coronavirus 2 by RT PCR NEGATIVE NEGATIVE Final    Comment: (NOTE) SARS-CoV-2 target nucleic acids are NOT DETECTED.  The SARS-CoV-2 RNA is generally detectable in upper respiratory specimens during the acute phase of infection. The lowest concentration of SARS-CoV-2 viral copies this assay can detect is 138 copies/mL. A negative result does not preclude SARS-Cov-2 infection and should not be used as the sole basis for treatment or other patient management decisions. A negative result may occur with  improper specimen collection/handling, submission of specimen other than nasopharyngeal swab, presence of viral  mutation(s) within the areas targeted by this assay, and inadequate number of viral copies(<138 copies/mL). A negative result must be combined with clinical observations, patient history, and epidemiological information. The expected result is Negative.  Fact Sheet for Patients:  EntrepreneurPulse.com.au  Fact Sheet for Healthcare Providers:  IncredibleEmployment.be  This test is no t yet approved or cleared by the [place] FDA and  has been authorized for detection and/or diagnosis of SARS-CoV-2 by FDA under an Emergency Use Authorization (EUA). This EUA will remain  in effect (meaning this test can be used) for the duration of the COVID-19 declaration under Section 564(b)(1) of the Act, 21 U.S.C.section 360bbb-3(b)(1), unless the authorization is terminated  or revoked sooner.       Influenza A by PCR NEGATIVE NEGATIVE Final   Influenza B by PCR NEGATIVE NEGATIVE Final    Comment: (NOTE) The Xpert Xpress SARS-CoV-2/FLU/RSV plus assay is intended as an aid in the diagnosis of influenza from Nasopharyngeal swab specimens and should not be used as a sole basis for treatment. Nasal washings and aspirates are unacceptable for Xpert Xpress SARS-CoV-2/FLU/RSV testing.  Fact Sheet for Patients: EntrepreneurPulse.com.au  Fact Sheet for Healthcare Providers: IncredibleEmployment.be  This test is not yet approved or cleared by the [place] FDA and has been authorized for detection and/or diagnosis of SARS-CoV-2 by FDA under an Emergency Use Authorization (EUA). This EUA will remain in effect (meaning this test can be used) for the duration of the COVID-19 declaration under Section 564(b)(1) of the Act, 21 U.S.C. section 360bbb-3(b)(1), unless the authorization is terminated or revoked.  Performed at [facility], [address] [place], [place] [address]     Coagulation Studies: No  results for input(s): LABPROT, INR in the last 72 hours.  Urinalysis: No results for input(s): COLORURINE, LABSPEC, PHURINE, GLUCOSEU, HGBUR, BILIRUBINUR, KETONESUR, PROTEINUR, UROBILINOGEN, NITRITE, LEUKOCYTESUR in the last 72 hours.  Invalid input(s): APPERANCEUR    Imaging: PERIPHERAL VASCULAR CATHETERIZATION  Result Date: 11/28/2020 See Op Note  DG Chest Port 1 View  Result Date: 11/28/2020 CLINICAL DATA:  Central line placement. EXAM: PORTABLE CHEST 1 VIEW COMPARISON:  November 24, 2020. FINDINGS: Stable cardiomegaly. Interval placement of left internal jugular catheter with distal tip in expected position of the right atrium. No pneumothorax is noted. Mild bibasilar subsegmental atelectasis is noted. Small right pleural effusion may be present. Bony thorax is unremarkable. IMPRESSION: Interval placement of left internal jugular catheter with distal tip in expected position of the right atrium. No pneumothorax is noted. Mild bibasilar subsegmental atelectasis is noted. Electronically Signed   By: Marijo Conception M.D.   On: 11/28/2020 14:55   DG Foot 2 Views Left  Result Date: 11/27/2020 CLINICAL DATA:  69 year old female with diabetes and concern for osteomyelitis. EXAM: LEFT FOOT - 2 VIEW COMPARISON:  Left foot radiograph dated 08/29/2020. FINDINGS: Evaluation is very limited due to advanced osteopenia. No definite acute fracture or dislocation. There is chronic changes of the ankle joint and old healed fracture of the distal tibia. Diffuse soft tissue swelling and subcutaneous edema. No radiopaque foreign object or soft tissue gas. Dressing noted over the plantar heel. IMPRESSION: 1. No definite acute fracture or dislocation. 2. Severe osteopenia with chronic changes of the ankle joint. 3. Diffuse soft tissue swelling and subcutaneous edema. Electronically Signed   By: Anner Crete M.D.   On: 11/27/2020 15:11     Medications:    . [MAR Hold] amiodarone  400 mg Oral BID  .  [MAR Hold] apixaban  5 mg Oral BID  . [START ON 11/29/2020] Chlorhexidine Gluconate Cloth  6 each Topical Q0600  . [MAR Hold] clopidogrel  75 mg Oral Daily  . [MAR Hold] collagenase   Topical Daily  . [MAR Hold] insulin aspart  0-6 Units Subcutaneous TID WC   [MAR Hold] acetaminophen, [MAR Hold] HYDROcodone-acetaminophen, [MAR Hold] ondansetron (ZOFRAN) IV  Assessment/ Plan:  Ms. Brooke Hopkins is a 69 y.o. white female with hypertension, diabetes mellitus type II insulin dependent, peripheral arterial disease, CVA, lymphedema, chronic lower extremity wounds, bed bound who was admitted to [facility] on 11/24/2020 for New onset atrial fibrillation ([place]) [I48.91] Elevated troponin [R77.8] Atrial fibrillation with rapid ventricular response ([place]) [I48.91] Acute renal failure, unspecified acute renal failure type ([place]) [N17.9]  # Acute renal failure with metabolic acidosis on chronic kidney disease stage IIIA:  Baseline creatinine of 1.03, GFR of 55 on 09/13/2020  History of proteinuria, hematuria and glycosuria.   acute renal failure secondary to poor PO intake, nausea, vomiting, acute cardiorenal syndrome and suspect ATN.   Lab Results  Component Value Date   CREATININE 5.61 (H) 11/28/2020   CREATININE 5.40 (H) 11/27/2020   CREATININE 5.08 (H) 11/26/2020    Renal function gradually worsening  Anuric Will plan for dialysis today Vascular team consulted for temporary dialysis access placement  #Anemia with renal failure Lab Results  Component Value Date   HGB 8.6 (L) 11/28/2020  Will consider Epogen with dialysis treatments  #Diabetes mellitus type II with CKD   Lab Results  Component Value Date   HGBA1C 7.1 (H) 11/27/2020  Continue current antihyperglycemic management per primary team recommendations  #Atrial fibrillation  Patient is on Amiodarone Cardiology team involved in the care   LOS: [place] 11/30/20213:46 PM

## 2020-11-28 NOTE — TOC Initial Note (Signed)
Transition of Care [facility]) - Initial/Assessment Note    Patient Details  Name: Brooke Hopkins MRN: [id number] Date of Birth: 06-10-1951  Transition of Care [facility]) CM/SW Contact:    Kerin Salen, RN Phone Number: 11/28/2020, 2:40 PM  Clinical Narrative:   Patient alert and oriented X4, husband Brooke Hopkins at bedside. 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 Patient lives at home with husband. Home health services provided by [facility], PT/OT/RN. Husband states RN provides dressing changes to left foot wound twice a week. Husband states he is retired for year now and takes care of wife/patient. [employer] to deliver medications. Husband provides transportation to PCP visits and assist with ADL's, toileting, cooking and shopping. Case Manager to continue to monitor.             Expected Discharge Plan: [place] Barriers to Discharge: Continued Medical Work up   Patient Goals and CMS Choice Patient states their goals for this hospitalization and ongoing recovery are:: Home with Husband   Choice offered to / list presented to : NA  Expected Discharge Plan and Services Expected Discharge Plan: [place] In-house Referral: NA Discharge Planning Services: Other - See comment (Continue HH services RN/PT/OTJackquline Hopkins) Post Acute Care Choice: Home Health Living arrangements for the past 2 months: [place]                 DME Arranged: Hospital bed, Shower stool DME Agency: [facility] [place]: [facility] Date [place]: 11/28/20 Time [place]: [id number] Representative spoke with at [place]: [place] Arrangements/Services Living arrangements for the past 2 months: [place] with:: Friends Patient language and need for interpreter reviewed:: Yes Do you feel safe going back to the place where you live?: Yes      Need for Family Participation in Patient Care:  Yes (Comment) Care giver support system in place?: Yes (comment) (Husband Brooke Hopkins) Current home services: Home OT, Home PT, Home RN Criminal Activity/Legal Involvement Pertinent to Current Situation/Hospitalization: No - Comment as needed  Activities of Daily Living Home Assistive Devices/Equipment: None ADL Screening (condition at time of admission) Patient's cognitive ability adequate to safely complete daily activities?: Yes Is the patient deaf or have difficulty hearing?: No Does the patient have difficulty seeing, even when wearing glasses/contacts?: No Does the patient have difficulty concentrating, remembering, or making decisions?: No Patient able to express need for assistance with ADLs?: Yes Does the patient have difficulty dressing or bathing?: Yes Independently performs ADLs?: No Communication: Independent Dressing (OT): Dependent Is this a change from baseline?: Pre-admission baseline Grooming: Dependent Is this a change from baseline?: Pre-admission baseline Feeding: Needs assistance Is this a change from baseline?: Pre-admission baseline Bathing: Dependent Is this a change from baseline?: Pre-admission baseline Toileting: Dependent Is this a change from baseline?: Pre-admission baseline In/Out Bed: Dependent Is this a change from baseline?: Pre-admission baseline Walks in Home: Dependent Is this a change from baseline?: Pre-admission baseline Does the patient have difficulty walking or climbing stairs?: No Weakness of Legs: Both Weakness of Arms/Hands: Both  Permission Sought/Granted Permission sought to share information with : Case Manager                Emotional Assessment Appearance:: Appears stated age Attitude/Demeanor/Rapport: Engaged Affect (typically observed): Accepting, Appropriate Orientation: : Oriented to Self, Oriented to Place, Oriented to  Time, Oriented to Situation Alcohol / Substance Use: Not Applicable Psych Involvement: No  (comment)  Admission diagnosis:  New onset atrial fibrillation (HCC) [I48.91] Elevated troponin [R77.8] Atrial fibrillation with rapid ventricular response (HCC) [I48.91] Acute renal failure, unspecified acute renal failure type [facility]) [N17.9] Patient Active Problem List   Diagnosis Date Noted  . Atrial fibrillation with rapid ventricular response ([place]) 11/24/2020  . AKI (acute kidney injury) ([place]) 11/24/2020  . Transaminitis 11/24/2020  . Elevated troponin 11/24/2020  . PAD (peripheral artery disease) ([place])   . Bladder incontinence 09/27/2020  . Diabetic retinopathy with macular edema associated with type 2 diabetes mellitus ([place]) 09/27/2020  . Kidney stones 09/27/2020  . Hypertension 09/27/2020  . Exogenous obesity 09/27/2020  . Hypoglycemia 09/13/2020  . Acute metabolic encephalopathy 66/05/3015  . Acute CVA (cerebrovascular accident) ([place]) 09/13/2020  . Morbid obesity ([place]) 09/13/2020  . Decubitus ulcer 08/29/2020  . Essential hypertension   . Type 2 diabetes mellitus with foot ulcer ([place])   . UTI (urinary tract infection) 06/16/2017  . Hypertensive urgency 06/16/2017  . Infection, Pseudomonas 08/28/2016  . Pure hypercholesterolemia 08/17/2016  . Lymphedema 08/17/2016  . Wound infection 08/16/2016  . 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 Cellulitis and abscess of leg 05/22/2016  . Chronic acquired lymphedema 04/03/2016  . Morbid obesity with BMI of 45.0-49.9, adult ([place]) 02/26/2016  . Pressure ulcer 10/17/2015  . Cellulitis 10/13/2015  . Venous stasis ulcer of ankle, left ([place]) 09/25/2015  . Type 2 diabetes mellitus ([place]) 07/06/2015  . Bilateral edema of lower extremity 06/12/2015  . 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 HLD (hyperlipidemia) 09/12/2014  . PN (peripheral neuropathy) 08/05/2014  . Venous stasis ulcer ([place]) 06/29/2010   PCP:  Lynnell Jude, MD Pharmacy:   [id number] [place], [place] [place] [address] Phone: [phone] Fax: [phone]     Social Determinants of Health (SDOH) Interventions    Readmission Risk Interventions Readmission Risk Prevention Plan 11/28/2020  Transportation Screening Complete  PCP or Specialist Appt within 5-7 Days Not Complete  Not Complete comments To be scheduled by unit secretary when discharged  [place] Screening Complete  Medication Review (RN CM) Complete  Some recent data might be hidden

## 2020-11-28 NOTE — Consult Note (Signed)
Reason for Consult: Diabetic ulceration left heel. Referring Physician: Alliene Klugh is an 69 y.o. female.  HPI: This is a 69 year old diabetic female with neuropathy and peripheral vascular disease with chronic history of ulcerations on her left heel and lower extremity.  Patient has recently been managed by the wound care center outpatient.  Recently admitted to the hospital with signs of sepsis.  Currently resides at a skilled nursing facility as she is no longer ambulatory.  Past Medical History:  Diagnosis Date  . Chronic kidney disease   . Diabetes mellitus   . Hypertension   . Stroke [facility])     Past Surgical History:  Procedure Laterality Date  . ABDOMINAL HYSTERECTOMY    . CARDIOVERSION N/A 11/27/2020   Procedure: CARDIOVERSION;  Surgeon: Wellington Hampshire, MD;  Location: [facility] ORS;  Service: Cardiovascular;  Laterality: N/A;  . CHOLECYSTECTOMY    . HAMMER TOE SURGERY    . KNEE ARTHROSCOPY    . LOWER EXTREMITY ANGIOGRAPHY Left 09/01/2020   Procedure: Lower Extremity Angiography;  Surgeon: Katha Cabal, MD;  Location: [facility] CV LAB;  Service: Cardiovascular;  Laterality: Left;  . LUMBAR DISC SURGERY  11/07/11  . LUMBAR WOUND DEBRIDEMENT  11/27/2011   Procedure: LUMBAR WOUND DEBRIDEMENT;  Surgeon: Eustace Moore;  Location: [facility] NEURO ORS;  Service: Neurosurgery;  Laterality: N/A;  . TONSILLECTOMY      Family History  Problem Relation Age of Onset  . CAD Mother   . Hypertension Mother   . COPD Father   . Diabetes Sister   . Hypertension Sister     Social History:  reports that she has never smoked. She has never used smokeless tobacco. She reports that she does not drink alcohol and does not use drugs.  Allergies: No Known Allergies  Medications:  Scheduled: . [MAR Hold] amiodarone  400 mg Oral BID  . [MAR Hold] apixaban  5 mg Oral BID  . [START ON 11/29/2020] Chlorhexidine Gluconate Cloth  6 each Topical Q0600  . [MAR Hold] clopidogrel  75 mg  Oral Daily  . [MAR Hold] collagenase   Topical Daily  . [MAR Hold] insulin aspart  0-6 Units Subcutaneous TID WC    Results for orders placed or performed during the hospital encounter of 11/24/20 (from the past 48 hour(s))  CBC with Differential/Platelet     Status: Abnormal   Collection Time: 11/27/20  6:29 AM  Result Value Ref Range   WBC 12.5 (H) 4.0 - 10.5 K/uL   RBC 3.63 (L) 3.87 - 5.11 MIL/uL   Hemoglobin 9.0 (L) 12.0 - 15.0 g/dL   HCT 29.7 (L) 36 - 46 %   MCV 81.8 80.0 - 100.0 fL   MCH 24.8 (L) 26.0 - 34.0 pg   MCHC 30.3 30.0 - 36.0 g/dL   RDW 16.9 (H) 11.5 - 15.5 %   Platelets 195 150 - 400 K/uL   nRBC 0.0 0.0 - 0.2 %   Neutrophils Relative % 82 %   Neutro Abs 10.3 (H) 1.7 - 7.7 K/uL   Lymphocytes Relative 8 %   Lymphs Abs 1.0 0.7 - 4.0 K/uL   Monocytes Relative 8 %   Monocytes Absolute 1.0 0.1 - 1.0 K/uL   Eosinophils Relative 1 %   Eosinophils Absolute 0.1 0.0 - 0.5 K/uL   Basophils Relative 0 %   Basophils Absolute 0.1 0.0 - 0.1 K/uL   Immature Granulocytes 1 %   Abs Immature Granulocytes 0.10 (H) 0.00 -  0.07 K/uL    Comment: Performed at [facility], [address] [place], [place] [address]  Comprehensive metabolic panel     Status: Abnormal   Collection Time: 11/27/20  6:29 AM  Result Value Ref Range   Sodium 137 135 - 145 mmol/L   Potassium 4.0 3.5 - 5.1 mmol/L   Chloride 101 98 - 111 mmol/L   CO2 21 (L) 22 - 32 mmol/L   Glucose, Bld 198 (H) 70 - 99 mg/dL    Comment: Glucose reference range applies only to samples taken after fasting for at least 8 hours.   BUN 81 (H) 8 - 23 mg/dL   Creatinine, Ser 5.40 (H) 0.44 - 1.00 mg/dL   Calcium 7.4 (L) 8.9 - 10.3 mg/dL   Total Protein 5.4 (L) 6.5 - 8.1 g/dL   Albumin 2.3 (L) 3.5 - 5.0 g/dL   AST 89 (H) 15 - 41 U/L   ALT 209 (H) 0 - 44 U/L   Alkaline Phosphatase 85 38 - 126 U/L   Total Bilirubin 1.0 0.3 - 1.2 mg/dL   GFR, Estimated 8 (L) >60 mL/min    Comment: (NOTE) Calculated using the CKD-EPI  Creatinine Equation (2021)    Anion gap 15 5 - 15    Comment: Performed at [facility], [address] [place],  [address]  Hemoglobin A1c     Status: Abnormal   Collection Time: 11/27/20  6:29 AM  Result Value Ref Range   Hgb A1c MFr Bld 7.1 (H) 4.8 - 5.6 %    Comment: (NOTE) Pre diabetes:          5.7%-6.4%  Diabetes:              >6.4%  Glycemic control for   <7.0% adults with diabetes    Mean Plasma Glucose 157.07 mg/dL    Comment: Performed at [facility], [address], [place], [place] [address]  Glucose, capillary     Status: Abnormal   Collection Time: 11/27/20 11:30 AM  Result Value Ref Range   Glucose-Capillary 199 (H) 70 - 99 mg/dL    Comment: Glucose reference range applies only to samples taken after fasting for at least 8 hours.  Glucose, capillary     Status: Abnormal   Collection Time: 11/27/20  5:30 PM  Result Value Ref Range   Glucose-Capillary 152 (H) 70 - 99 mg/dL    Comment: Glucose reference range applies only to samples taken after fasting for at least 8 hours.  CBC with Differential/Platelet     Status: Abnormal   Collection Time: 11/28/20  5:59 AM  Result Value Ref Range   WBC 13.8 (H) 4.0 - 10.5 K/uL   RBC 3.48 (L) 3.87 - 5.11 MIL/uL   Hemoglobin 8.6 (L) 12.0 - 15.0 g/dL   HCT 27.8 (L) 36 - 46 %   MCV 79.9 (L) 80.0 - 100.0 fL   MCH 24.7 (L) 26.0 - 34.0 pg   MCHC 30.9 30.0 - 36.0 g/dL   RDW 16.8 (H) 11.5 - 15.5 %   Platelets 196 150 - 400 K/uL   nRBC 0.0 0.0 - 0.2 %   Neutrophils Relative % 79 %   Neutro Abs 10.8 (H) 1.7 - 7.7 K/uL   Lymphocytes Relative 12 %   Lymphs Abs 1.7 0.7 - 4.0 K/uL   Monocytes Relative 7 %   Monocytes Absolute 1.0 0.1 - 1.0 K/uL   Eosinophils Relative 1 %   Eosinophils Absolute  0.1 0.0 - 0.5 K/uL   Basophils Relative 0 %   Basophils Absolute 0.1 0.0 - 0.1 K/uL   Immature Granulocytes 1 %   Abs Immature Granulocytes 0.11 (H) 0.00 - 0.07 K/uL    Comment: Performed at [facility], [address] [place], [place] [address]  Comprehensive metabolic panel     Status: Abnormal   Collection Time: 11/28/20  5:59 AM  Result Value Ref Range   Sodium 136 135 - 145 mmol/L   Potassium 3.9 3.5 - 5.1 mmol/L   Chloride 98 98 - 111 mmol/L   CO2 23 22 - 32 mmol/L   Glucose, Bld 125 (H) 70 - 99 mg/dL    Comment: Glucose reference range applies only to samples taken after fasting for at least 8 hours.   BUN 83 (H) 8 - 23 mg/dL   Creatinine, Ser 5.61 (H) 0.44 - 1.00 mg/dL   Calcium 7.4 (L) 8.9 - 10.3 mg/dL   Total Protein 5.5 (L) 6.5 - 8.1 g/dL   Albumin 2.1 (L) 3.5 - 5.0 g/dL   AST 56 (H) 15 - 41 U/L   ALT 161 (H) 0 - 44 U/L   Alkaline Phosphatase 79 38 - 126 U/L   Total Bilirubin 0.6 0.3 - 1.2 mg/dL   GFR, Estimated 8 (L) >60 mL/min    Comment: (NOTE) Calculated using the CKD-EPI Creatinine Equation (2021)    Anion gap 15 5 - 15    Comment: Performed at [facility], [address] [place], [place] [address]  Glucose, capillary     Status: Abnormal   Collection Time: 11/28/20  8:40 AM  Result Value Ref Range   Glucose-Capillary 123 (H) 70 - 99 mg/dL    Comment: Glucose reference range applies only to samples taken after fasting for at least 8 hours.  Glucose, capillary     Status: Abnormal   Collection Time: 11/28/20 11:16 AM  Result Value Ref Range   Glucose-Capillary 117 (H) 70 - 99 mg/dL    Comment: Glucose reference range applies only to samples taken after fasting for at least 8 hours.    PERIPHERAL VASCULAR CATHETERIZATION  Result Date: 11/28/2020 See Op Note  DG Chest Port 1 View  Result Date: 11/28/2020 CLINICAL DATA:  Central line placement. EXAM: PORTABLE CHEST 1 VIEW COMPARISON:  November 24, 2020. FINDINGS: Stable cardiomegaly. Interval placement of left internal jugular catheter with distal tip in expected position of the right atrium. No pneumothorax is noted. Mild bibasilar subsegmental atelectasis is noted. Small right pleural  effusion may be present. Bony thorax is unremarkable. IMPRESSION: Interval placement of left internal jugular catheter with distal tip in expected position of the right atrium. No pneumothorax is noted. Mild bibasilar subsegmental atelectasis is noted. Electronically Signed   By: Marijo Conception M.D.   On: 11/28/2020 14:55   DG Foot 2 Views Left  Result Date: 11/27/2020 CLINICAL DATA:  70 year old female with diabetes and concern for osteomyelitis. EXAM: LEFT FOOT - 2 VIEW COMPARISON:  Left foot radiograph dated 08/29/2020. FINDINGS: Evaluation is very limited due to advanced osteopenia. No definite acute fracture or dislocation. There is chronic changes of the ankle joint and old healed fracture of the distal tibia. Diffuse soft tissue swelling and subcutaneous edema. No radiopaque foreign object or soft tissue gas. Dressing noted over the plantar heel. IMPRESSION: 1. No definite acute fracture or dislocation. 2. Severe osteopenia with chronic changes of the ankle joint. 3. Diffuse soft tissue swelling and  subcutaneous edema. Electronically Signed   By: Anner Crete M.D.   On: 11/27/2020 15:11    Review of Systems  Constitutional: Negative for chills and fever.  HENT: Negative for sinus pain and sore throat.   Respiratory: Negative for cough and shortness of breath.   Cardiovascular: Negative for chest pain and palpitations.  Gastrointestinal: Negative for nausea and vomiting.  Endocrine: Negative for polydipsia and polyuria.  Genitourinary: Negative for frequency and urgency.  Musculoskeletal:       Patient is paraplegic.  Skin:       Relates chronic wounds on her left heel and leg for the past several months.  Neurological:       Patient does relate some neuropathy associated with her diabetes.  Psychiatric/Behavioral: Negative for confusion. The patient is not nervous/anxious.    Blood pressure 137/81, pulse 67, temperature 98.4 F (36.9 C), temperature source Oral, resp. rate 18,  height 5\' 7"  (1.702 m), weight 132 kg, SpO2 96 %. Physical Exam Cardiovascular:     Comments: DP and PT pulses could not be palpated. Musculoskeletal:     Comments: Plantarflexion of the feet bilateral.  Unable to move lower extremities.  Skin:    Comments: The skin is warm dry and somewhat atrophic.  Significant bilateral edema.  A full-thickness necrotic ulceration on the posterior and plantar aspect of the left heel with some surrounding erythema.  Some purulent drainage and malodor.  Bone is easily palpated through the base of the ulceration  Neurological:     Comments: Complete loss of protective threshold with a monofilament wire in the feet and toes.  Proprioception absent.       Assessment/Plan: Assessment: 1.  Diabetes with neuropathy. 2.  Peripheral vascular disease. 3.  Osteomyelitis left heel. 4.  Full-thickness ulceration with necrosis of muscle.  Plan: Saline wet-to-dry gauze applied to the ulceration on the left heel.  Clinically the ulceration probes directly down to the level of bone on the left heel with radiographic evidence of cortical erosion at the posterior aspect of the calcaneus as compared to previous films.  Discussed with the patient as well as her husband that after reviewing previous vascular surgery notes and talking to Dr. Delana Meyer that her healing potential is very limited and I think she would most likely fair better with an amputation of the lower extremity to control her infection and give her the best chance for healing.  At this point podiatry will sign off and defer back to vascular surgery for definitive treatment.  Durward Fortes 11/28/2020, 3:36 PM

## 2020-11-28 NOTE — Progress Notes (Signed)
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 Florentina Addison and had 13 X 6 cm wound debrided 11/15/2020  Came to ED because of low blood sugar in the 50s-endorsed multiple areas of nausea vomiting at home noted to be tachycardic in A. fib given metoprolol IV x2 with no improvement started Cardizem gtt. developed hypotension and then was cardioverted in the ED for unstable atrial fibrillation Cardiology was consulted  Also found to have AKI on admission baseline creatinine 1.0-->76/4.5 with CO2 19 anion gap 15 and nephrology consulted  Because of hypotension Cardizem and beta-blockers held and patient placed on amiodarone gtt. 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 as per cardiology-cardioverted to sinus  rhythm 11/29 and currently amiodarone 400 twice daily p.o. Eliquis started this admission-aspirin Plavix discontinued Rest of planning per cardiology  Left-sided weakness (new) in the setting of prior CVA 01/2015 08/2019 CT head neg for new CVA 11/27 Already on Eliquis would hold given risk of bleeding aspirin and Plavix at least  AKI and ATN likely from A. fib and poor renal perfusion with oliguria Bladder scan 11/27 neg--not obstructed Hold lisinopril 40/Lasix 20 fluids held--- SPEP light chains another urine studies ordered-labs sent 11/29 Going for temporary access placement 11/30 and first time dialysis today  chronic sacral and lower extremity wounds followed by Dr. Asa Saunas Picture of wounds performed wound nurse has made recommendations which we will try to follow she is quite tender to movement of either lower extremity specifically the right knee  Leukocytosis White count 10.8-11 > 12.5-underlying sacral decubiti but did not appear infected on evaluation-monitor expectantly if spikes fever will panculture and image her left foot X-ray of left foot does not confirm osteomyelitis but shows bony destruction-Dr. Caryl Comes of podiatry consulted for opinion --may need debridement versus amputation? Obtain ESR, CRP and start Ancef PVD status post multiple stents left side Plavix aspirin now on hold given need for Eliquis reevaluate as an outpatient may be able to resume aspirin  Hypoglycemia on admission CBGs sugars 117-125 -currently on only sensitive sliding scale 11/29 given renal insufficiency Hold NPH 7030 10 twice daily for now given AKI  History of hypertension with hypotension currently Holding labetalol 100 twice daily clonidine patch Reevaluate need based on recovery   cirrhosis of the liver Possibly secondary to fatty liver versus hypotension and heart failure LFT's continue to trend  down slowly Hepatitis panels neg  Total assist at baseline  DVT prophylaxis:  Eliquis Code Status: DNR verbalized prior patient at bedside Family Communication: Discussed with husband in detail at the bedside plan of care over past several days, disucsse dwiht paitent na dDrCline and Dr. Candiss Norse today coordinating overall care Disposition:  Status is: Inpatient  Remains inpatient appropriate because:Persistent severe electrolyte disturbances, Ongoing diagnostic testing needed not appropriate for outpatient work up, Unsafe d/c plan and Inpatient level of care appropriate due to severity of illness   Dispo: The patient is from: Home              Anticipated d/c is to: Home              Anticipated d/c date is: > 3 days              Patient currently is not medically stable to d/c.       Consultants:   Cardiology  Procedures: Cardioversion 11/26 Echocardiogram Repeat cardioversion 200 J 11/29 Dr. Fletcher Anon  Antimicrobials: None   Subjective: Awake coherent remains in NSR Seen earlier today and then on HD unit Understands vaguely overall big picture--aware may need foot surgery  Objective: Vitals:   11/28/20 0402 11/28/20 0435 11/28/20 0835 11/28/20 1118  BP: (!) 125/58  (!) 142/76 (!) 153/66  Pulse: (!) 57  (!) 58 (!) 59  Resp: 18  18 18   Temp: 98.3 F (36.8 C)  98 F (36.7 C) 98 F (36.7 C)  TempSrc: Oral  Oral Oral  SpO2: 96%  98% 100%  Weight:  132 kg    Height:        Intake/Output Summary (Last 24 hours) at 11/28/2020 1147 Last data filed at 11/28/2020 1012 Gross per 24 hour  Intake 600 ml  Output 0 ml  Net 600 ml   Filed Weights   11/25/20 0219 11/27/20 0434 11/28/20 0435  Weight: 123.3 kg 127.5 kg 132 kg    Examination:  Pleasant thick neck no distress CTA B limited exam but no added sound S1-S2 on monitorssinus/sinus brady on monitors Has bruising on left upper extremity Wounds on lower extremities not evaluated today see pictures from 11/29  Data Reviewed: I have personally reviewed following labs and imaging  studies BUNs/creatinine 76/4.5-->79/4.8-->81/5.6 CO2 19-->21--23 Anion gap 13-->15  Hemoglobin 10.0-->9.2-->9.0-->8.6 WBC 9.0-->10.8-->12.5--13.8 Platelet 196  Radiology Studies: DG Foot 2 Views Left  Result Date: 11/27/2020 CLINICAL DATA:  69 year old female with diabetes and concern for osteomyelitis. EXAM: LEFT FOOT - 2 VIEW COMPARISON:  Left foot radiograph dated 08/29/2020. FINDINGS: Evaluation is very limited due to advanced osteopenia. No definite acute fracture or dislocation. There is chronic changes of the ankle joint and old healed fracture of the distal tibia. Diffuse soft tissue swelling and subcutaneous edema. No radiopaque foreign object or soft tissue gas. Dressing noted over the plantar heel. IMPRESSION: 1. No definite acute fracture or dislocation. 2. Severe osteopenia with chronic changes of the ankle joint. 3. Diffuse soft tissue swelling and subcutaneous edema. Electronically Signed   By: Anner Crete M.D.   On: 11/27/2020 15:11     Scheduled Meds: . amiodarone  400 mg Oral BID  . apixaban  5 mg Oral BID  . clopidogrel  75 mg Oral Daily  . collagenase   Topical Daily  . insulin aspart  0-6 Units Subcutaneous TID WC   Continuous Infusions:    LOS: 4 days    Time spent: Numa, MD [facility]  To contact the attending provider between 7A-7P or the covering provider during after hours 7P-7A, please log into the web site [email or web address] and access using universal [place] password for that web site. If you do not have the password, please call the hospital operator.  11/28/2020, 11:47 AM

## 2020-11-28 NOTE — Progress Notes (Signed)
Pt is off the unit to specials.

## 2020-11-28 NOTE — Op Note (Signed)
  OPERATIVE NOTE   PROCEDURE: 1. Insertion of temporary dialysis catheter catheter left IJ approach.  PRE-OPERATIVE DIAGNOSIS: Acute on chronic renal disease  POST-OPERATIVE DIAGNOSIS: Same  SURGEON: Katha Cabal M.D.  ANESTHESIA: 1% lidocaine local infiltration  ESTIMATED BLOOD LOSS: Minimal cc  INDICATIONS:   Brooke Hopkins is a 69 y.o. female who presents with worsening of renal function likely secondary to sepsis.  She is undergoing placement of a temporary catheter so that dialysis can be initiated.  Risks and benefits of been reviewed patient has agreed to proceed..  DESCRIPTION: After obtaining full informed written consent, the patient was positioned supine. The left neck and chest wall was prepped and draped in a sterile fashion. Ultrasound was placed in a sterile sleeve. Ultrasound was utilized to identify the left internal jugular vein which is noted to be echolucent and compressible indicating patency. Images recorded for the permanent record. Under real-time visualization a Seldinger needle is inserted into the vein and the guidewires advanced without difficulty. Small counterincision was made at the wire insertion site. Dilator is passed over the wire and the temporary dialysis catheter catheter is fed over the wire without difficulty.  All lumens aspirate and flush easily and are packed with heparin saline. Catheter secured to the skin of the left chest wall with 2-0 silk. A sterile dressing is applied with Biopatch.  COMPLICATIONS: None  CONDITION: Unchanged  Hortencia Pilar Office:  [phone] 11/28/2020, 2:13 PM

## 2020-11-28 NOTE — Progress Notes (Deleted)
Pt is moving to room 105A. Pt spouse made aware. Report given to [facility]. Requested swat to transfer the patient.

## 2020-11-28 NOTE — H&P (View-Only) (Signed)
[facility] Vascular Consult Note  MRN : [id number]  Brooke Hopkins is a 69 y.o. (10/28/1951) female who presents with chief complaint of  Chief Complaint  Patient presents with  . Atrial Fibrillation   History of Present Illness:  Patient is admitted to the hospital with tachycardia due to atrial fibrillation with RVR and has developed acute on chronic renal failure.    The nephrology service has decided to initiate dialysis at this time, and we are asked to place a temporary dialysis catheter for immediate dialysis use.   Vascular surgery was consulted by Dr. Candiss Norse for placement of a temporary dialysis catheter.  Current Facility-Administered Medications  Medication Dose Route Frequency Provider Last Rate Last Admin  . acetaminophen (TYLENOL) tablet 650 mg  650 mg Oral Q4H PRN Agbata, Tochukwu, MD      . amiodarone (PACERONE) tablet 400 mg  400 mg Oral BID Kate Sable, MD   400 mg at 11/28/20 0917  . apixaban (ELIQUIS) tablet 5 mg  5 mg Oral BID Kate Sable, MD   5 mg at 11/27/20 2216  . clopidogrel (PLAVIX) tablet 75 mg  75 mg Oral Daily Agbata, Tochukwu, MD   75 mg at 11/27/20 0849  . collagenase (SANTYL) ointment   Topical Daily Nita Sells, MD   Given at 11/28/20 [phone]  . HYDROcodone-acetaminophen (NORCO/VICODIN) 5-325 MG per tablet 1 tablet  1 tablet Oral Q6H PRN Agbata, Tochukwu, MD   1 tablet at 11/27/20 2217  . insulin aspart (novoLOG) injection 0-6 Units  0-6 Units Subcutaneous TID WC Nita Sells, MD   1 Units at 11/27/20 1735  . ondansetron (ZOFRAN) injection 4 mg  4 mg Intravenous Q6H PRN Agbata, Tochukwu, MD   4 mg at 11/27/20 1612   Past Medical History:  Diagnosis Date  . Chronic kidney disease   . Diabetes mellitus   . Hypertension   . Stroke [facility])    Past Surgical History:  Procedure Laterality Date  . ABDOMINAL HYSTERECTOMY    . CARDIOVERSION N/A 11/27/2020   Procedure: CARDIOVERSION;  Surgeon: Wellington Hampshire, MD;  Location: [facility] ORS;  Service: Cardiovascular;  Laterality: N/A;  . CHOLECYSTECTOMY    . HAMMER TOE SURGERY    . KNEE ARTHROSCOPY    . LOWER EXTREMITY ANGIOGRAPHY Left 09/01/2020   Procedure: Lower Extremity Angiography;  Surgeon: Katha Cabal, MD;  Location: [place] [facility];  Service: Cardiovascular;  Laterality: Left;  . LUMBAR DISC SURGERY  11/07/11  . LUMBAR WOUND DEBRIDEMENT  11/27/2011   Procedure: LUMBAR WOUND DEBRIDEMENT;  Surgeon: Eustace Moore;  Location: [place] NEURO ORS;  Service: Neurosurgery;  Laterality: N/A;  . TONSILLECTOMY     Social History Social History   Tobacco Use  . Smoking status: Never Smoker  . Smokeless tobacco: Never Used  Vaping Use  . Vaping Use: Never used  Substance Use Topics  . Alcohol use: No  . Drug use: No   Family History Family History  Problem Relation Age of Onset  . CAD Mother   . Hypertension Mother   . COPD Father   . Diabetes Sister   . Hypertension Sister   Denies family history of peripheral artery disease, venous disease or renal disease.  No Known Allergies  REVIEW OF SYSTEMS (Negative unless checked)  Constitutional: [] Weight loss  [] Fever  [] Chills Cardiac: [] Chest pain   [] Chest pressure   [] Palpitations   [] Shortness of breath when laying flat   [] Shortness of breath at  rest   [] Shortness of breath with exertion. Vascular:  [] Pain in legs with walking   [] Pain in legs at rest   [] Pain in legs when laying flat   [] Claudication   [] Pain in feet when walking  [] Pain in feet at rest  [] Pain in feet when laying flat   [] History of DVT   [] Phlebitis   [] Swelling in legs   [] Varicose veins   [] Non-healing ulcers Pulmonary:   [] Uses home oxygen   [] Productive cough   [] Hemoptysis   [] Wheeze  [] COPD   [] Asthma Neurologic:  [] Dizziness  [] Blackouts   [] Seizures   [] History of stroke   [] History of TIA  [] Aphasia   [] Temporary blindness   [] Dysphagia   [] Weakness or numbness in arms   [] Weakness or numbness in  legs Musculoskeletal:  [] Arthritis   [] Joint swelling   [] Joint pain   [] Low back pain Hematologic:  [] Easy bruising  [] Easy bleeding   [] Hypercoagulable state   [] Anemic  [] Hepatitis Gastrointestinal:  [] Blood in stool   [] Vomiting blood  [] Gastroesophageal reflux/heartburn   [] Difficulty swallowing. Genitourinary:  [x] Chronic kidney disease   [] Difficult urination  [] Frequent urination  [] Burning with urination   [] Blood in urine Skin:  [] Rashes   [x] Ulcers   [x] Wounds Psychological:  [] History of anxiety   []  History of major depression.  Physical Examination  Vitals:   11/28/20 0435 11/28/20 0835 11/28/20 1118 11/28/20 1204  BP:  (!) 142/76 (!) 153/66 (!) 165/77  Pulse:  (!) 58 (!) 59 66  Resp:  18 18 19   Temp:  98 F (36.7 C) 98 F (36.7 C) 98.4 F (36.9 C)  TempSrc:  Oral Oral Oral  SpO2:  98% 100% 97%  Weight: 132 kg     Height:       Body mass index is 45.58 kg/m. Gen: WD/WN Head: [place]/AT, No temporalis wasting Ear/Nose/Throat: Hearing grossly intact, nares w/o erythema or drainage Eyes: Sclera non-icteric, conjunctiva clear Neck: Supple, no nuchal rigidity.  No JVD.  Pulmonary:  Good air movement, clear to auscultation bilaterally.  Cardiac: Irregularly irregular, no Murmurs, rubs or gallops. Vascular: Hard to palpate pedal pulses due to edema morbid obesity however extremities are warm. Gastrointestinal: soft, non-tender/non-distended. No guarding/reflex.  Musculoskeletal: M/S 5/5 throughout.  Extremities without ischemic changes.  No deformity or atrophy. Mild edema in the lower extremities bilaterally Neurologic:  Psychiatric: Difficult to assess due to the severity of patient's illness. Dermatologic: Lower extremity and sacral Lymph : No Cervical, Axillary, or Inguinal lymphadenopathy.  CBC Lab Results  Component Value Date   WBC 13.8 (H) 11/28/2020   HGB 8.6 (L) 11/28/2020   HCT 27.8 (L) 11/28/2020   MCV 79.9 (L) 11/28/2020   PLT 196 11/28/2020   BMET     Component Value Date/Time   NA 136 11/28/2020 0559   K 3.9 11/28/2020 0559   CL 98 11/28/2020 0559   CO2 23 11/28/2020 0559   GLUCOSE 125 (H) 11/28/2020 0559   BUN 83 (H) 11/28/2020 0559   CREATININE 5.61 (H) 11/28/2020 0559   CALCIUM 7.4 (L) 11/28/2020 0559   GFRNONAA 8 (L) 11/28/2020 0559   GFRAA >60 09/13/2020 0539   Estimated Creatinine Clearance: 13 mL/min (A) (by C-G formula based on SCr of 5.61 mg/dL (H)).  COAG Lab Results  Component Value Date   INR 0.9 09/12/2020   Radiology CT HEAD WO CONTRAST  Result Date: 11/25/2020 CLINICAL DATA:  Altered mental status. EXAM: CT HEAD WITHOUT CONTRAST TECHNIQUE: Contiguous axial images were obtained  from the base of the skull through the vertex without intravenous contrast. COMPARISON:  MR brain dated 09/13/2020 and CT head dated 09/12/2020. FINDINGS: Brain: No evidence of acute infarction, hemorrhage, hydrocephalus, extra-axial collection or mass lesion/mass effect. The previously seen subcentimeter acute infarct is not identified on today's exam. 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 There is mild cerebral volume loss with associated ex vacuo dilatation. Periventricular white matter hypoattenuation likely represents chronic small vessel ischemic disease. Vascular: There are vascular calcifications in the carotid siphons. Skull: Normal. Negative for fracture or focal lesion. Sinuses/Orbits: No acute finding. Other: None. IMPRESSION: No acute intracranial process. Electronically Signed   By: Zerita Boers M.D.   On: 11/25/2020 15:57   US Abdomen Complete  Result Date: 11/24/2020 CLINICAL DATA:  Transaminitis. EXAM: ABDOMEN ULTRASOUND COMPLETE COMPARISON:  None. FINDINGS: Gallbladder: Surgically removed. Common bile duct: Diameter: 0.4 cm Liver: No focal lesion identified. Echogenicity is within normal limits. Liver contour is mildly nodular. Portal vein is patent on color Doppler  imaging with normal direction of blood flow towards the liver. IVC: No abnormality visualized. Pancreas: Visualized portion unremarkable. Spleen: Size and appearance within normal limits. Right Kidney: Length: 9.3 cm. Echogenicity within normal limits. No mass or hydronephrosis visualized. Left Kidney: Length: 9.7 cm. Echogenicity within normal limits. No mass or hydronephrosis visualized. Abdominal aorta: Mid and distal abdominal aorta cannot be visualized due to bowel gas. Other findings: Right pleural effusion. IMPRESSION: 1. Liver contour is mildly nodular and concerning for cirrhosis. No biliary dilatation. Cholecystectomy. 2. Right pleural effusion. Electronically Signed   By: Markus Daft M.D.   On: 11/24/2020 15:25   DG Chest Portable 1 View  Result Date: 11/24/2020 CLINICAL DATA:  New onset atrial fibrillation.  Shortness of breath. EXAM: PORTABLE CHEST 1 VIEW COMPARISON:  Chest x-ray dated November 11, 2016. FINDINGS: The patient is rotated to the right, limiting evaluation. Stable cardiomediastinal silhouette with heart size at the upper limits of normal. Pulmonary vascular congestion. Possible small right pleural effusion. No consolidation or pneumothorax. No acute osseous abnormality. IMPRESSION: 1. Limited study due to patient rotation. Pulmonary vascular congestion and possible small right pleural effusion. Electronically Signed   By: Titus Dubin M.D.   On: 11/24/2020 09:51   DG Foot 2 Views Left  Result Date: 11/27/2020 CLINICAL DATA:  69 year old female with diabetes and concern for osteomyelitis. EXAM: LEFT FOOT - 2 VIEW COMPARISON:  Left foot radiograph dated 08/29/2020. FINDINGS: Evaluation is very limited due to advanced osteopenia. No definite acute fracture or dislocation. There is chronic changes of the ankle joint and old healed fracture of the distal tibia. Diffuse soft tissue swelling and subcutaneous edema. No radiopaque foreign object or soft tissue gas. Dressing noted over  the plantar heel. IMPRESSION: 1. No definite acute fracture or dislocation. 2. Severe osteopenia with chronic changes of the ankle joint. 3. Diffuse soft tissue swelling and subcutaneous edema. Electronically Signed   By: Anner Crete M.D.   On: 11/27/2020 15:11   ECHOCARDIOGRAM COMPLETE  Result Date: 11/25/2020    ECHOCARDIOGRAM REPORT   Patient Name:   Brooke Hopkins Date of Exam: 11/25/2020 Medical Rec #:  [id number]         Height:       67.0 in Accession #:    [id number]        Weight:       271.9 lb Date of Birth:  04-09-1951  BSA:          2.304 m Patient Age:    71 years          BP:           116/49 mmHg Patient Gender: F                 HR:           154 bpm. Exam Location:  [facility] Procedure: 2D Echo, Cardiac Doppler and Color Doppler Indications:     Atrial Fibrillation 427.31 / I48.91  History:         Patient has prior history of Echocardiogram examinations. Risk                  Factors:Hypertension.  Sonographer:     Alyse Low Roar Referring Phys:  [place] Diagnosing Phys: Ida Rogue MD IMPRESSIONS  1. Left ventricular ejection fraction, by estimation, is 30 to 35%. The left ventricle has moderately decreased function. The left ventricle demonstrates global hypokinesis. Unable to exclude regional wall motion abnormality, select images with hypokinesis of the anterior and anteroseptal wall though not well visualized. Left ventricular diastolic parameters are indeterminate.  2. Right ventricular systolic function is normal. The right ventricular size is normal. There is mildly elevated pulmonary artery systolic pressure. The estimated right ventricular systolic pressure is 94.8 mmHg.  3. Left atrial size was moderately dilated.  4. Right atrial size was moderately dilated.  5. Tricuspid valve regurgitation is moderate.  6. Mild mitral valve regurgitation.  7. A small pericardial effusion is present.  8. Rhythm is atrial fibrillation with RVR FINDINGS  Left  Ventricle: Left ventricular ejection fraction, by estimation, is 30 to 35%. The left ventricle has moderately decreased function. The left ventricle demonstrates global hypokinesis. The left ventricular internal cavity size was normal in size. There is no left ventricular hypertrophy. Left ventricular diastolic parameters are indeterminate. Right Ventricle: The right ventricular size is normal. No increase in right ventricular wall thickness. Right ventricular systolic function is normal. There is mildly elevated pulmonary artery systolic pressure. The tricuspid regurgitant velocity is 2.97  m/s, and with an assumed right atrial pressure of 5 mmHg, the estimated right ventricular systolic pressure is 54.6 mmHg. Left Atrium: Left atrial size was moderately dilated. Right Atrium: Right atrial size was moderately dilated. Pericardium: A small pericardial effusion is present. Mitral Valve: The mitral valve is normal in structure. Mild mitral valve regurgitation. No evidence of mitral valve stenosis. Tricuspid Valve: The tricuspid valve is normal in structure. Tricuspid valve regurgitation is moderate . No evidence of tricuspid stenosis. Aortic Valve: The aortic valve was not well visualized. Aortic valve regurgitation is not visualized. Mild to moderate aortic valve sclerosis/calcification is present, without any evidence of aortic stenosis. Aortic valve peak gradient measures 8.8 mmHg. Pulmonic Valve: The pulmonic valve was normal in structure. Pulmonic valve regurgitation is trivial. No evidence of pulmonic stenosis. Aorta: The aortic root is normal in size and structure. Venous: The inferior vena cava is normal in size with greater than 50% respiratory variability, suggesting right atrial pressure of 3 mmHg. IAS/Shunts: No atrial level shunt detected by color flow Doppler.  LEFT VENTRICLE PLAX 2D LVIDd:         3.87 cm  Diastology LVIDs:         3.29 cm  LV e' medial:    7.72 cm/s LV PW:         1.10 cm  LV E/e'  medial:  12.8 LV IVS:        1.16 cm  LV e' lateral:   9.36 cm/s LVOT diam:     1.50 cm  LV E/e' lateral: 10.5 LVOT Area:     1.77 cm  RIGHT VENTRICLE RV Mid diam:    4.01 cm LEFT ATRIUM             Index LA diam:        4.30 cm 1.87 cm/m LA Vol (A2C):   71.9 ml 31.20 ml/m LA Vol (A4C):   86.1 ml 37.37 ml/m LA Biplane Vol: 78.9 ml 34.24 ml/m  AORTIC VALVE                PULMONIC VALVE AV Area (Vmax): 0.88 cm    PV Vmax:        0.79 m/s AV Vmax:        148.00 cm/s PV Peak grad:   2.5 mmHg AV Peak Grad:   8.8 mmHg    RVOT Peak grad: 1 mmHg LVOT Vmax:      73.30 cm/s  AORTA Ao Root diam: 2.60 cm MITRAL VALVE               TRICUSPID VALVE MV Area (PHT): 3.12 cm    TR Peak grad:   35.3 mmHg MV Decel Time: 243 msec    TR Vmax:        297.00 cm/s MV E velocity: 98.50 cm/s                            SHUNTS                            Systemic Diam: 1.50 cm Ida Rogue MD Electronically signed by Ida Rogue MD Signature Date/Time: 11/25/2020/2:56:01 PM    Final    Assessment/Plan Patient is admitted to the hospital with tachycardia due to atrial fibrillation with RVR and has developed acute on chronic renal failure.    1.  Acute on chronic renal failure: We will proceed with temporary dialysis catheter placement at this time.  Risks and benefits discussed with patient and/or family, and the catheter will be placed to allow immediate initiation of dialysis.  If the patient's renal function does not improve throughout the hospital course, we will be happy to place a tunneled dialysis catheter for long term use prior to discharge.   2.  Peripheral artery disease: Last seen in our office on October 25, 2020 At that time, noninvasive studies showing marginal evidence of healing ability. Patient was referred to wound clinic for local wound care with discussion of possible amputation if wounds do not heal. On aspirin, Plavix and statin for medical management   3.  A. fib with RVR: Converted to sinus  rhythm Cardiology following  Discussed with Dr. Francene Castle, PA-C  11/28/2020 12:42 PM

## 2020-11-29 ENCOUNTER — Other Ambulatory Visit (INDEPENDENT_AMBULATORY_CARE_PROVIDER_SITE_OTHER): Payer: Self-pay | Admitting: Vascular Surgery

## 2020-11-29 ENCOUNTER — Inpatient Hospital Stay: Payer: Medicare HMO

## 2020-11-29 ENCOUNTER — Ambulatory Visit: Payer: Medicare HMO | Admitting: Internal Medicine

## 2020-11-29 DIAGNOSIS — M86272 Subacute osteomyelitis, left ankle and foot: Secondary | ICD-10-CM | POA: Diagnosis not present

## 2020-11-29 DIAGNOSIS — Z7401 Bed confinement status: Secondary | ICD-10-CM | POA: Diagnosis not present

## 2020-11-29 DIAGNOSIS — I480 Paroxysmal atrial fibrillation: Secondary | ICD-10-CM

## 2020-11-29 DIAGNOSIS — I739 Peripheral vascular disease, unspecified: Secondary | ICD-10-CM | POA: Diagnosis not present

## 2020-11-29 DIAGNOSIS — L89323 Pressure ulcer of left buttock, stage 3: Secondary | ICD-10-CM

## 2020-11-29 DIAGNOSIS — N179 Acute kidney failure, unspecified: Secondary | ICD-10-CM | POA: Diagnosis not present

## 2020-11-29 DIAGNOSIS — I10 Essential (primary) hypertension: Secondary | ICD-10-CM | POA: Diagnosis not present

## 2020-11-29 DIAGNOSIS — I4891 Unspecified atrial fibrillation: Secondary | ICD-10-CM | POA: Diagnosis not present

## 2020-11-29 DIAGNOSIS — M25561 Pain in right knee: Secondary | ICD-10-CM

## 2020-11-29 LAB — GLUCOSE, CAPILLARY
Glucose-Capillary: 86 mg/dL (ref 70–99)
Glucose-Capillary: 115 mg/dL — ABNORMAL HIGH (ref 70–99)
Glucose-Capillary: 135 mg/dL — ABNORMAL HIGH (ref 70–99)
Glucose-Capillary: 116 mg/dL — ABNORMAL HIGH (ref 70–99)

## 2020-11-29 MED ORDER — CHLORHEXIDINE GLUCONATE CLOTH 2 % EX PADS: 6.0000 | MEDICATED_PAD | Freq: Once | CUTANEOUS | Status: DC

## 2020-11-29 MED ORDER — CEFAZOLIN SODIUM-DEXTROSE 2-4 GM/100ML-% IV SOLN
2.0000 g | INTRAVENOUS | Status: AC
  Administered 2020-11-30: 1 g via INTRAVENOUS
  Filled 2020-11-29: qty 100

## 2020-11-29 MED ORDER — SODIUM CHLORIDE 0.9 % IV SOLN
INTRAVENOUS | Status: DC | PRN
  Administered 2020-11-29 – 2020-12-01 (×3): 250 mL via INTRAVENOUS

## 2020-11-29 MED ORDER — CHLORHEXIDINE GLUCONATE CLOTH 2 % EX PADS
6.0000 | MEDICATED_PAD | Freq: Once | CUTANEOUS | Status: AC
  Administered 2020-11-30: 6 via TOPICAL

## 2020-11-29 MED ORDER — NEPRO/CARBSTEADY PO LIQD
237.0000 mL | Freq: Two times a day (BID) | ORAL | Status: DC
  Administered 2020-11-29 – 2020-12-04 (×8): 237 mL via ORAL

## 2020-11-29 MED ORDER — CEFAZOLIN SODIUM-DEXTROSE 1-4 GM/50ML-% IV SOLN
1.0000 g | INTRAVENOUS | Status: DC
  Administered 2020-11-29 – 2020-12-01 (×3): 1 g via INTRAVENOUS
  Filled 2020-11-29 (×4): qty 50

## 2020-11-29 NOTE — Consult Note (Signed)
Pharmacy Antibiotic Note  Brooke Hopkins is a 69 y.o. female admitted on 11/24/2020 with osteomyelitis .  Pharmacy has been consulted for cefazolin dosing.  No documented beta-lactam allergies. Patient with acute renal failure, receiving HD.   Plan: Cefazolin 1 g IV q 24 hours. Give after HD on dialysis days  Height: 5\' 7"  (170.2 cm) Weight: 122.9 kg (271 lb) IBW/kg (Calculated) : 61.6  Temp (24hrs), Avg:98.6 F (37 C), Min:98.2 F (36.8 C), Max:99.2 F (37.3 C)  Recent Labs  Lab 11/24/20 0934 11/25/20 0723 11/26/20 0502 11/27/20 0629 11/28/20 0559  WBC 10.1 9.0 10.8* 12.5* 13.8*  CREATININE 4.57* 4.88* 5.08* 5.40* 5.61*    Estimated Creatinine Clearance: 12.5 mL/min (A) (by C-G formula based on SCr of 5.61 mg/dL (H)).    No Known Allergies   Thank you for allowing pharmacy to be a part of this patient's care.  Dorothe Pea, PharmD, BCPS 11/29/2020 2:50 PM

## 2020-11-29 NOTE — Progress Notes (Signed)
Progress Note  Patient Name: Brooke Hopkins Date of Encounter: 11/29/2020  [facility] Cardiologist: Kate Sable, MD   Subjective   Doing okay, temporary dialysis catheter left IJ placed yesterday.  Maintaining sinus rhythm on p.o. amiodarone  Inpatient Medications    Scheduled Meds: . amiodarone  400 mg Oral BID  . apixaban  5 mg Oral BID  . Chlorhexidine Gluconate Cloth  6 each Topical Q0600  . clopidogrel  75 mg Oral Daily  . collagenase   Topical Daily  . feeding supplement (NEPRO CARB STEADY)  237 mL Oral BID BM  . insulin aspart  0-6 Units Subcutaneous TID WC   Continuous Infusions:  PRN Meds: acetaminophen, HYDROcodone-acetaminophen, ondansetron (ZOFRAN) IV   Vital Signs    Vitals:   11/28/20 1847 11/29/20 0327 11/29/20 0759 11/29/20 0857  BP: 136/61 112/77 (!) 137/36 (!) 127/40  Pulse: 72 68 71 72  Resp: 18 18 14 19   Temp: 98.7 F (37.1 C) 98.7 F (37.1 C) 99.2 F (37.3 C) 98.2 F (36.8 C)  TempSrc: Oral Oral Oral Oral  SpO2: 98% 100% 94% 98%  Weight:  122.9 kg    Height:        Intake/Output Summary (Last 24 hours) at 11/29/2020 0949 Last data filed at 11/28/2020 2140 Gross per 24 hour  Intake 240 ml  Output 497 ml  Net -257 ml   Last 3 Weights 11/29/2020 11/28/2020 11/27/2020  Weight (lbs) 271 lb 291 lb 281 lb  Weight (kg) 122.925 kg 131.997 kg 127.461 kg  Some encounter information is confidential and restricted. Go to Review Flowsheets activity to see all data.      Telemetry    Sinus rhythm, heart rate 66- Personally Reviewed  ECG    No new tracing- Personally Reviewed  Physical Exam   GEN: No acute distress, morbidly obese Neck: No JVD Cardiac:  Distant heart sounds, regular Respiratory:  Poor inspiratory, decreased breath sounds at bases GI: Soft, nontender, distended  MS:  Lymphedema, left leg wound dressing noted. Neuro:  Nonfocal , generalized weakness Psych: Normal affect   Labs    High Sensitivity  Troponin:   Recent Labs  Lab 11/24/20 0934 11/24/20 1128 11/24/20 1338 11/24/20 1528  TROPONINIHS 194* 190* 205* 212*      Chemistry Recent Labs  Lab 11/26/20 0502 11/27/20 0629 11/28/20 0559  NA 140 137 136  K 4.1 4.0 3.9  CL 105 101 98  CO2 19* 21* 23  GLUCOSE 165* 198* 125*  BUN 80* 81* 83*  CREATININE 5.08* 5.40* 5.61*  CALCIUM 7.3* 7.4* 7.4*  PROT 5.6* 5.4* 5.5*  ALBUMIN 2.4* 2.3* 2.1*  AST 160* 89* 56*  ALT 273* 209* 161*  ALKPHOS 89 85 79  BILITOT 0.7 1.0 0.6  GFRNONAA 9* 8* 8*  ANIONGAP 16* 15 15     Hematology Recent Labs  Lab 11/26/20 0502 11/27/20 0629 11/28/20 0559  WBC 10.8* 12.5* 13.8*  RBC 3.78* 3.63* 3.48*  HGB 9.5* 9.0* 8.6*  HCT 31.2* 29.7* 27.8*  MCV 82.5 81.8 79.9*  MCH 25.1* 24.8* 24.7*  MCHC 30.4 30.3 30.9  RDW 17.0* 16.9* 16.8*  PLT 217 195 196    BNPNo results for input(s): BNP, PROBNP in the last 168 hours.   DDimer No results for input(s): DDIMER in the last 168 hours.   Radiology    PERIPHERAL VASCULAR CATHETERIZATION  Result Date: 11/28/2020 See Op Note  DG Chest Port 1 View  Result Date: 11/28/2020 CLINICAL DATA:  Central line placement. EXAM: PORTABLE CHEST 1 VIEW COMPARISON:  November 24, 2020. FINDINGS: Stable cardiomegaly. Interval placement of left internal jugular catheter with distal tip in expected position of the right atrium. No pneumothorax is noted. Mild bibasilar subsegmental atelectasis is noted. Small right pleural effusion may be present. Bony thorax is unremarkable. IMPRESSION: Interval placement of left internal jugular catheter with distal tip in expected position of the right atrium. No pneumothorax is noted. Mild bibasilar subsegmental atelectasis is noted. Electronically Signed   By: Marijo Conception M.D.   On: 11/28/2020 14:55   DG Foot 2 Views Left  Result Date: 11/27/2020 CLINICAL DATA:  69 year old female with diabetes and concern for osteomyelitis. EXAM: LEFT FOOT - 2 VIEW COMPARISON:  Left  foot radiograph dated 08/29/2020. FINDINGS: Evaluation is very limited due to advanced osteopenia. No definite acute fracture or dislocation. There is chronic changes of the ankle joint and old healed fracture of the distal tibia. Diffuse soft tissue swelling and subcutaneous edema. No radiopaque foreign object or soft tissue gas. Dressing noted over the plantar heel. IMPRESSION: 1. No definite acute fracture or dislocation. 2. Severe osteopenia with chronic changes of the ankle joint. 3. Diffuse soft tissue swelling and subcutaneous edema. Electronically Signed   By: Anner Crete M.D.   On: 11/27/2020 15:11    Cardiac Studies   Echo 08/2020 1. Left ventricular ejection fraction, by estimation, is 60 to 65%. The  left ventricle has normal function. The left ventricle has no regional  wall motion abnormalities. Left ventricular diastolic function could not  be evaluated.  2. Right ventricular systolic function is normal. The right ventricular  size is normal.  3. The mitral valve is grossly normal. No evidence of mitral valve  regurgitation.  4. Tricuspid valve regurgitation not assessed.  5. The aortic valve was not well visualized. Aortic valve regurgitation  is not visualized.  Patient Profile     69 y.o. female history of hypertension, CVA, PAD, CKD, morbid obesity, bedbound presenting with low blood sugars, found to have atrial fibrillation with rapid ventricular response.  Status post DC cardioversion x2, maintaining sinus rhythm on po amiodarone.  Assessment & Plan    1.  Paroxysmal atrial fibrillation -CHA2DS2-VASc of 7 -Currently maintaining sinus rhythm -Continue p.o. amiodarone 400 mg twice daily x1 week.  Can decrease to 200 mg twice daily after 1 week. -Continue Eliquis 5 mg twice daily  2.  Hypertension -BP controlled on amiodarone -PTA meds for now.  If blood pressure becomes elevated, can restart home medications.   3.  CVA -Lipitor, Plavix, Eliquis  4.   PAD status post peripheral stent -Lipitor, Plavix  5.  CKD -Acute on chronic renal dysfunction  -Dialysis being planned by renal team -nephrology following    6.  Microcytic anemia -Monitor H&H -If keeps trending down, may consider stopping Plavix  Signed, Kate Sable, MD  11/29/2020, 9:49 AM

## 2020-11-29 NOTE — Progress Notes (Signed)
[name], female   DOB: [birth year], 69 y.o.   MRN: [id number] [facility] PROGRESS NOTE  Brooke Hopkins ZJQ:[id number] DOB: 1951-04-10 DOA: 11/24/2020 PCP: Lynnell Jude, MD  HPI/Subjective: Patient feels okay.  She has some right knee pain from being dropped by transport a few weeks back as per patient.  Patient has a left heel ulcer and poor circulation.  No chest pain or shortness of breath.  Admitted 11/24/2020 with new onset atrial fibrillation and acute kidney injury  Objective: Vitals:   11/29/20 1230 11/29/20 1303  BP: (!) 157/63 (!) 146/58  Pulse: 82 78  Resp: 20 18  Temp:  98.8 F (37.1 C)  SpO2:  96%    Intake/Output Summary (Last 24 hours) at 11/29/2020 1420 Last data filed at 11/29/2020 1230 Gross per 24 hour  Intake 240 ml  Output 997 ml  Net -757 ml   Filed Weights   11/27/20 0434 11/28/20 0435 11/29/20 0327  Weight: 127.5 kg 132 kg 122.9 kg    ROS: Review of Systems  Respiratory: Negative for cough and shortness of breath.   Cardiovascular: Negative for chest pain.  Gastrointestinal: Negative for abdominal pain, nausea and vomiting.  Musculoskeletal: Positive for joint pain.   Exam: Physical Exam HENT:     Head: Normocephalic.     Mouth/Throat:     Pharynx: No oropharyngeal exudate.  Eyes:     General: Lids are normal.     Conjunctiva/sclera: Conjunctivae normal.     Pupils: Pupils are equal, round, and reactive to light.  Cardiovascular:     Rate and Rhythm: Normal rate. Rhythm irregularly irregular.     Heart sounds: Normal heart sounds, S1 normal and S2 normal.  Pulmonary:     Breath sounds: Examination of the right-lower field reveals decreased breath sounds. Examination of the left-lower field reveals decreased breath sounds. Decreased breath sounds present. No wheezing, rhonchi or rales.  Abdominal:     Palpations: Abdomen is soft.     Tenderness: There is no abdominal tenderness.  Musculoskeletal:     Right  lower leg: Swelling present.     Left lower leg: Swelling present.  Skin:    General: Skin is warm.     Comments: Left foot covered  Neurological:     Mental Status: She is alert.     Comments: Answers all questions appropriately.       Data Reviewed: Basic Metabolic Panel: Recent Labs  Lab 11/24/20 0934 11/25/20 0723 11/26/20 0502 11/27/20 0629 11/28/20 0559  NA 141 140 140 137 136  K 4.4 4.2 4.1 4.0 3.9  CL 107 108 105 101 98  CO2 19* 19* 19* 21* 23  GLUCOSE 90 89 165* 198* 125*  BUN 76* 79* 80* 81* 83*  CREATININE 4.57* 4.88* 5.08* 5.40* 5.61*  CALCIUM 8.0* 7.9* 7.3* 7.4* 7.4*  MG  --  1.8  --   --   --    Liver Function Tests: Recent Labs  Lab 11/24/20 0934 11/26/20 0502 11/27/20 0629 11/28/20 0559  AST 463* 160* 89* 56*  ALT 401* 273* 209* 161*  ALKPHOS 105 89 85 79  BILITOT 1.3* 0.7 1.0 0.6  PROT 6.3* 5.6* 5.4* 5.5*  ALBUMIN 2.8* 2.4* 2.3* 2.1*   CBC: Recent Labs  Lab 11/24/20 0934 11/25/20 0723 11/26/20 0502 11/27/20 0629 11/28/20 0559  WBC 10.1 9.0 10.8* 12.5* 13.8*  NEUTROABS  --   --  8.6* 10.3* 10.8*  HGB 10.0* 9.2* 9.5* 9.0* 8.6*  HCT 34.2* 30.8* 31.2* 29.7* 27.8*  MCV 84.4 83.5 82.5 81.8 79.9*  PLT 277 230 217 195 196   BNP (last 3 results) Recent Labs    09/13/20 1046  BNP 233.8*     CBG: Recent Labs  Lab 11/28/20 0840 11/28/20 1116 11/28/20 2030 11/29/20 0756 11/29/20 1300  GLUCAP 123* 117* 93 86 115*    Recent Results (from the past 240 hour(s))  Resp Panel by RT-PCR (Flu A&B, Covid) Nasopharyngeal Swab     Status: None   Collection Time: 11/24/20  9:35 AM   Specimen: Nasopharyngeal Swab; Nasopharyngeal(NP) swabs in vial transport medium  Result Value Ref Range Status   SARS Coronavirus 2 by RT PCR NEGATIVE NEGATIVE Final    Comment: (NOTE) SARS-CoV-2 target nucleic acids are NOT DETECTED.  The SARS-CoV-2 RNA is generally detectable in upper respiratory specimens during the acute phase of infection. The  lowest concentration of SARS-CoV-2 viral copies this assay can detect is 138 copies/mL. A negative result does not preclude SARS-Cov-2 infection and should not be used as the sole basis for treatment or other patient management decisions. A negative result may occur with  improper specimen collection/handling, submission of specimen other than nasopharyngeal swab, presence of viral mutation(s) within the areas targeted by this assay, and inadequate number of viral copies(<138 copies/mL). A negative result must be combined with clinical observations, patient history, and epidemiological information. The expected result is Negative.  Fact Sheet for Patients:  EntrepreneurPulse.com.au  Fact Sheet for Healthcare Providers:  IncredibleEmployment.be  This test is no t yet approved or cleared by the [place] FDA and  has been authorized for detection and/or diagnosis of SARS-CoV-2 by FDA under an Emergency Use Authorization (EUA). This EUA will remain  in effect (meaning this test can be used) for the duration of the COVID-19 declaration under Section 564(b)(1) of the Act, 21 U.S.C.section 360bbb-3(b)(1), unless the authorization is terminated  or revoked sooner.       Influenza A by PCR NEGATIVE NEGATIVE Final   Influenza B by PCR NEGATIVE NEGATIVE Final    Comment: (NOTE) The Xpert Xpress SARS-CoV-2/FLU/RSV plus assay is intended as an aid in the diagnosis of influenza from Nasopharyngeal swab specimens and should not be used as a sole basis for treatment. Nasal washings and aspirates are unacceptable for Xpert Xpress SARS-CoV-2/FLU/RSV testing.  Fact Sheet for Patients: EntrepreneurPulse.com.au  Fact Sheet for Healthcare Providers: IncredibleEmployment.be  This test is not yet approved or cleared by the [place] FDA and has been authorized for detection and/or diagnosis of SARS-CoV-2 by FDA under  an Emergency Use Authorization (EUA). This EUA will remain in effect (meaning this test can be used) for the duration of the COVID-19 declaration under Section 564(b)(1) of the Act, 21 U.S.C. section 360bbb-3(b)(1), unless the authorization is terminated or revoked.  Performed at [facility], [address], [place], [place] [address]      Studies: DG Knee 1-2 Views Right  Result Date: 11/29/2020 CLINICAL DATA:  Fall. EXAM: RIGHT KNEE - 1-2 VIEW COMPARISON:  04/30/2017 FINDINGS: Suboptimal study due to technique and positioning. Generalized osteopenia.  No fracture identified Tricompartmental degenerative change with joint space narrowing and spurring most severe in the patellofemoral joint. Chondrocalcinosis. Arterial calcification IMPRESSION: Negative for fracture.  Tricompartmental degenerative change. Electronically Signed   By: Franchot Gallo M.D.   On: 11/29/2020 10:28   PERIPHERAL VASCULAR CATHETERIZATION  Result Date: 11/28/2020 See Op Note  DG Chest Port 1 View  Result Date:  11/28/2020 CLINICAL DATA:  Central line placement. EXAM: PORTABLE CHEST 1 VIEW COMPARISON:  November 24, 2020. FINDINGS: Stable cardiomegaly. Interval placement of left internal jugular catheter with distal tip in expected position of the right atrium. No pneumothorax is noted. Mild bibasilar subsegmental atelectasis is noted. Small right pleural effusion may be present. Bony thorax is unremarkable. IMPRESSION: Interval placement of left internal jugular catheter with distal tip in expected position of the right atrium. No pneumothorax is noted. Mild bibasilar subsegmental atelectasis is noted. Electronically Signed   By: Marijo Conception M.D.   On: 11/28/2020 14:55   DG Foot 2 Views Left  Result Date: 11/27/2020 CLINICAL DATA:  69 year old female with diabetes and concern for osteomyelitis. EXAM: LEFT FOOT - 2 VIEW COMPARISON:  Left foot radiograph dated 08/29/2020. FINDINGS: Evaluation is very  limited due to advanced osteopenia. No definite acute fracture or dislocation. There is chronic changes of the ankle joint and old healed fracture of the distal tibia. Diffuse soft tissue swelling and subcutaneous edema. No radiopaque foreign object or soft tissue gas. Dressing noted over the plantar heel. IMPRESSION: 1. No definite acute fracture or dislocation. 2. Severe osteopenia with chronic changes of the ankle joint. 3. Diffuse soft tissue swelling and subcutaneous edema. Electronically Signed   By: Anner Crete M.D.   On: 11/27/2020 15:11    Scheduled Meds: . amiodarone  400 mg Oral BID  . Chlorhexidine Gluconate Cloth  6 each Topical Q0600  . collagenase   Topical Daily  . feeding supplement (NEPRO CARB STEADY)  237 mL Oral BID BM  . insulin aspart  0-6 Units Subcutaneous TID WC    Assessment/Plan:  1. Left heel osteomyelitis full-thickness ulceration with necrosis of muscle, leukocytosis.  Dr. Cleda Mccreedy podiatry recommended amputation.  Vascular surgery to take to the operating room tomorrow.  Holding Eliquis and Plavix today.  Empiric Ancef. 2. Acute renal failure on chronic kidney disease stage IIIa..  Creatinine kept on increasing to 5.61 on 11/28/2020, baseline creatinine 1.03.  (greater than 1.5 times baseline within 7 days).  Dialysis yesterday and today.  Temporary catheter placed. 3. Paroxysmal atrial fibrillation.  Holding Eliquis for procedure tomorrow will restart after.  On oral amiodarone. 4. Bedbound status 5. Stage III buttock decubitus I continue to monitor, present on admission see description below. 6. Cirrhosis of the liver seen on imaging study with elevated liver function test.  Continue to monitor. 7. Right knee pain x-ray showing try compartmental degenerative changes.  Pressure Injury 11/26/20 Buttocks Left Stage 3 -  Full thickness tissue loss. Subcutaneous fat may be visible but bone, tendon or muscle are NOT exposed. (Active)  11/26/20 1313  Location:  Buttocks  Location Orientation: Left  Staging: Stage 3 -  Full thickness tissue loss. Subcutaneous fat may be visible but bone, tendon or muscle are NOT exposed.  Wound Description (Comments):   Present on Admission:      Pressure Injury 11/26/20 Heel Right Unstageable - Full thickness tissue loss in which the base of the injury is covered by slough (yellow, tan, gray, green or brown) and/or eschar (tan, brown or black) in the wound bed. (Active)  11/26/20 1314  Location: Heel  Location Orientation: Right  Staging: Unstageable - Full thickness tissue loss in which the base of the injury is covered by slough (yellow, tan, gray, green or brown) and/or eschar (tan, brown or black) in the wound bed.  Wound Description (Comments):   Present on Admission:  Code Status:     Code Status Orders  (From admission, onward)         Start     Ordered   11/24/20 1310  Do not attempt resuscitation (DNR)  Continuous       Question Answer Comment  In the event of cardiac or respiratory ARREST Do not call a "code blue"   In the event of cardiac or respiratory ARREST Do not perform Intubation, CPR, defibrillation or ACLS   In the event of cardiac or respiratory ARREST Use medication by any route, position, wound care, and other measures to relive pain and suffering. May use oxygen, suction and manual treatment of airway obstruction as needed for comfort.   Comments Code status was discussed with patient and she is a DNR      11/24/20 1313        Code Status History    Date Active Date Inactive Code Status Order ID Comments User Context   09/13/2020 0336 09/14/2020 1648 Full Code [id number]  Sidney Ace Arvella Merles, MD ED   08/29/2020 1654 09/05/2020 2107 Full Code [id number]  Vashti Hey, MD ED   06/16/2017 1852 06/20/2017 2044 Full Code [id number]  Saundra Shelling, MD Inpatient   08/16/2016 2029 08/20/2016 0225 Full Code [id number]  Demetrios Loll, MD Inpatient   10/13/2015 2210 10/17/2015 1831 Full  Code [id number]  Aldean Jewett, MD Inpatient   Advance Care Planning Activity     Family Communication: Husband at the bedside Disposition Plan: Status is: Inpatient  Dispo: The patient is from: Home              Anticipated d/c is to: Rehab              Anticipated d/c date is: Likely early next week with recuperation after amputation here in the hospital.              Patient currently going to the OR for left leg amputation tomorrow.  Consultants:  Vascular surgery  Nephrology  Podiatry  Wound care nurse  Cardiology  Time spent: 28 minutes  [place]

## 2020-11-29 NOTE — Progress Notes (Addendum)
[facility]  ROUNDING NOTE   Subjective:  Patient seen in dialysis today, receiving second Dialysis treatment and tolerating well.   HEMODIALYSIS FLOWSHEET:  Blood Flow Rate (mL/min): 100 mL/min Arterial Pressure (mmHg): -40 mmHg Venous Pressure (mmHg): 50 mmHg Transmembrane Pressure (mmHg): 10 mmHg Ultrafiltration Rate (mL/min): 70 mL/min Dialysate Flow Rate (mL/min): 300 ml/min Conductivity: Machine : 14 Conductivity: Machine : 14 Dialysis Fluid Bolus: Normal Saline Bolus Amount (mL): 250 mL  Objective:  Vital signs in last 24 hours:  Temp:  [98.2 F (36.8 C)-99.2 F (37.3 C)] 98.8 F (37.1 C) (12/01 1303) Pulse Rate:  [63-82] 78 (12/01 1303) Resp:  [8-24] 18 (12/01 1303) BP: (106-157)/(35-88) 146/58 (12/01 1303) SpO2:  [94 %-100 %] 96 % (12/01 1303) Weight:  [122.9 kg] 122.9 kg (12/01 0327)  Weight change: -9.072 kg Filed Weights   11/27/20 0434 11/28/20 0435 11/29/20 0327  Weight: 127.5 kg 132 kg 122.9 kg    Intake/Output: I/O last 3 completed shifts: In: 600 [P.O.:600] Out: 497 [Other:497]   Intake/Output this shift:  Total I/O In: -  Out: 500 [Other:500]  Physical Exam: General:  Lying in bed, receiving dialysis treatment  Head:  Normocephalic, atraumatic  Eyes:  Sclerae and conjunctivae clear  Lungs:    lungs diminished at the bases  Heart:  S1S2, HR in 70s  Abdomen:  Soft, nontender, obese  Extremities: 1+ peripheral edema. Left lower extremity with dressing clean dry and intact  Neurologic:  Oriented x 3, speech clear and appropriate  Skin: No acute lesions or rashes, left lower leg with dressing Right leg swollen and erythematous   Access Lt IJ Temporary catheter    Basic Metabolic Panel: Recent Labs  Lab 11/24/20 0934 11/24/20 0934 11/25/20 0723 11/25/20 0723 11/26/20 0502 11/27/20 0629 11/28/20 0559  NA 141  --  140  --  140 137 136  K 4.4  --  4.2  --  4.1 4.0 3.9  CL 107  --  108  --  105 101 98  CO2 19*  --  19*   --  19* 21* 23  GLUCOSE 90  --  89  --  165* 198* 125*  BUN 76*  --  79*  --  80* 81* 83*  CREATININE 4.57*  --  4.88*  --  5.08* 5.40* 5.61*  CALCIUM 8.0*   < > 7.9*   < > 7.3* 7.4* 7.4*  MG  --   --  1.8  --   --   --   --    < > = values in this interval not displayed.    Liver Function Tests: Recent Labs  Lab 11/24/20 0934 11/26/20 0502 11/27/20 0629 11/28/20 0559  AST 463* 160* 89* 56*  ALT 401* 273* 209* 161*  ALKPHOS 105 89 85 79  BILITOT 1.3* 0.7 1.0 0.6  PROT 6.3* 5.6* 5.4* 5.5*  ALBUMIN 2.8* 2.4* 2.3* 2.1*   No results for input(s): LIPASE, AMYLASE in the last 168 hours. No results for input(s): AMMONIA in the last 168 hours.  CBC: Recent Labs  Lab 11/24/20 0934 11/25/20 0723 11/26/20 0502 11/27/20 0629 11/28/20 0559  WBC 10.1 9.0 10.8* 12.5* 13.8*  NEUTROABS  --   --  8.6* 10.3* 10.8*  HGB 10.0* 9.2* 9.5* 9.0* 8.6*  HCT 34.2* 30.8* 31.2* 29.7* 27.8*  MCV 84.4 83.5 82.5 81.8 79.9*  PLT 277 230 217 195 196    Cardiac Enzymes: No results for input(s): CKTOTAL, CKMB, CKMBINDEX, TROPONINI in the  last 168 hours.  BNP: Invalid input(s): POCBNP  CBG: Recent Labs  Lab 11/28/20 0840 11/28/20 1116 11/28/20 2030 11/29/20 0756 11/29/20 1300  GLUCAP 123* 117* 93 86 115*    Microbiology: Results for orders placed or performed during the hospital encounter of 11/24/20  Resp Panel by RT-PCR (Flu A&B, Covid) Nasopharyngeal Swab     Status: None   Collection Time: 11/24/20  9:35 AM   Specimen: Nasopharyngeal Swab; Nasopharyngeal(NP) swabs in vial transport medium  Result Value Ref Range Status   SARS Coronavirus 2 by RT PCR NEGATIVE NEGATIVE Final    Comment: (NOTE) SARS-CoV-2 target nucleic acids are NOT DETECTED.  The SARS-CoV-2 RNA is generally detectable in upper respiratory specimens during the acute phase of infection. The lowest concentration of SARS-CoV-2 viral copies this assay can detect is 138 copies/mL. A negative result does not preclude  SARS-Cov-2 infection and should not be used as the sole basis for treatment or other patient management decisions. A negative result may occur with  improper specimen collection/handling, submission of specimen other than nasopharyngeal swab, presence of viral mutation(s) within the areas targeted by this assay, and inadequate number of viral copies(<138 copies/mL). A negative result must be combined with clinical observations, patient history, and epidemiological information. The expected result is Negative.  Fact Sheet for Patients:  EntrepreneurPulse.com.au  Fact Sheet for Healthcare Providers:  IncredibleEmployment.be  This test is no t yet approved or cleared by the [place] FDA and  has been authorized for detection and/or diagnosis of SARS-CoV-2 by FDA under an Emergency Use Authorization (EUA). This EUA will remain  in effect (meaning this test can be used) for the duration of the COVID-19 declaration under Section 564(b)(1) of the Act, 21 U.S.C.section 360bbb-3(b)(1), unless the authorization is terminated  or revoked sooner.       Influenza A by PCR NEGATIVE NEGATIVE Final   Influenza B by PCR NEGATIVE NEGATIVE Final    Comment: (NOTE) The Xpert Xpress SARS-CoV-2/FLU/RSV plus assay is intended as an aid in the diagnosis of influenza from Nasopharyngeal swab specimens and should not be used as a sole basis for treatment. Nasal washings and aspirates are unacceptable for Xpert Xpress SARS-CoV-2/FLU/RSV testing.  Fact Sheet for Patients: EntrepreneurPulse.com.au  Fact Sheet for Healthcare Providers: IncredibleEmployment.be  This test is not yet approved or cleared by the [place] FDA and has been authorized for detection and/or diagnosis of SARS-CoV-2 by FDA under an Emergency Use Authorization (EUA). This EUA will remain in effect (meaning this test can be used) for the duration of  the COVID-19 declaration under Section 564(b)(1) of the Act, 21 U.S.C. section 360bbb-3(b)(1), unless the authorization is terminated or revoked.  Performed at [facility], [address] [place], [place] [address]     Coagulation Studies: No results for input(s): LABPROT, INR in the last 72 hours.  Urinalysis: No results for input(s): COLORURINE, LABSPEC, PHURINE, GLUCOSEU, HGBUR, BILIRUBINUR, KETONESUR, PROTEINUR, UROBILINOGEN, NITRITE, LEUKOCYTESUR in the last 72 hours.  Invalid input(s): APPERANCEUR    Imaging: DG Knee 1-2 Views Right  Result Date: 11/29/2020 CLINICAL DATA:  Fall. EXAM: RIGHT KNEE - 1-2 VIEW COMPARISON:  04/30/2017 FINDINGS: Suboptimal study due to technique and positioning. Generalized osteopenia.  No fracture identified Tricompartmental degenerative change with joint space narrowing and spurring most severe in the patellofemoral joint. Chondrocalcinosis. Arterial calcification IMPRESSION: Negative for fracture.  Tricompartmental degenerative change. Electronically Signed   By: Franchot Gallo M.D.   On: 11/29/2020 10:28   PERIPHERAL VASCULAR CATHETERIZATION  Result Date: 11/28/2020 See Op Note  DG Chest Port 1 View  Result Date: 11/28/2020 CLINICAL DATA:  Central line placement. EXAM: PORTABLE CHEST 1 VIEW COMPARISON:  November 24, 2020. FINDINGS: Stable cardiomegaly. Interval placement of left internal jugular catheter with distal tip in expected position of the right atrium. No pneumothorax is noted. Mild bibasilar subsegmental atelectasis is noted. Small right pleural effusion may be present. Bony thorax is unremarkable. IMPRESSION: Interval placement of left internal jugular catheter with distal tip in expected position of the right atrium. No pneumothorax is noted. Mild bibasilar subsegmental atelectasis is noted. Electronically Signed   By: Marijo Conception M.D.   On: 11/28/2020 14:55     Medications:   .  ceFAZolin (ANCEF) IV     .  amiodarone  400 mg Oral BID  . Chlorhexidine Gluconate Cloth  6 each Topical Q0600  . collagenase   Topical Daily  . feeding supplement (NEPRO CARB STEADY)  237 mL Oral BID BM  . insulin aspart  0-6 Units Subcutaneous TID WC   acetaminophen, HYDROcodone-acetaminophen, ondansetron (ZOFRAN) IV  Assessment/ Plan:  Brooke Hopkins is a 69 y.o. white female with hypertension, diabetes mellitus type II insulin dependent, peripheral arterial disease, CVA, lymphedema, chronic lower extremity wounds, bed bound who was admitted to [facility] on 11/24/2020 for New onset atrial fibrillation ([place]) [I48.91] Elevated troponin [R77.8] Atrial fibrillation with rapid ventricular response ([place]) [I48.91] Acute renal failure, unspecified acute renal failure type ([place]) [N17.9]  # Acute renal failure with metabolic acidosis on chronic kidney disease stage IIIA:  Baseline creatinine of 1.03, GFR of 55 on 09/13/2020  History of proteinuria, hematuria and glycosuria.  Acute renal failure secondary to poor PO intake, nausea, vomiting, acute cardiorenal syndrome and suspect ATN.    Patient received her first dialysis treatment yesterday after establishing temporary catheter on her left IJ Receiving second treatment today and tolerating well We will continue to assess daily   #Anemia with renal failure Lab Results  Component Value Date   HGB 8.6 (L) 11/28/2020  Will consider Epogen with dialysis treatments  #Diabetes mellitus type II with CKD   Lab Results  Component Value Date   HGBA1C 7.1 (H) 11/27/2020  Blood glucose readings within acceptable range Patient is on insulin aspart  #Atrial fibrillation Maintaining sinus rhythm now Patient is on Amiodarone p.o.    LOS: 5 Brooke Hopkins 12/1/20213:03 PM

## 2020-11-29 NOTE — Progress Notes (Signed)
[facility] Daily Progress Note   Subjective: 11/28/20: 1. Insertion of temporary dialysis catheter catheter left IJ approach.  Patient without complaint.  Husband at bedside.  Objective: Vitals:   11/28/20 1847 11/29/20 0327 11/29/20 0759 11/29/20 0857  BP: 136/61 112/77 (!) 137/36 (!) 127/40  Pulse: 72 68 71 72  Resp: 18 18 14 19   Temp: 98.7 F (37.1 C) 98.7 F (37.1 C) 99.2 F (37.3 C) 98.2 F (36.8 C)  TempSrc: Oral Oral Oral Oral  SpO2: 98% 100% 94% 98%  Weight:  122.9 kg    Height:        Intake/Output Summary (Last 24 hours) at 11/29/2020 1105 Last data filed at 11/28/2020 2140 Gross per 24 hour  Intake 240 ml  Output 497 ml  Net -257 ml   Physical Exam: A&Ox2, NAD Neck:  Temporary dialysis catheter intact.  Clean and dry CV: RRR Pulmonary: CTA Bilaterally Abdomen: Soft, Nontender, Nondistended Vascular:  Left lower extremity: Thigh soft.  Calf soft.  Wound noted to heal.  Unable to palpate pedal pulses.  Quadriplegic.   Laboratory: CBC    Component Value Date/Time   WBC 13.8 (H) 11/28/2020 0559   HGB 8.6 (L) 11/28/2020 0559   HCT 27.8 (L) 11/28/2020 0559   PLT 196 11/28/2020 0559   BMET    Component Value Date/Time   NA 136 11/28/2020 0559   K 3.9 11/28/2020 0559   CL 98 11/28/2020 0559   CO2 23 11/28/2020 0559   GLUCOSE 125 (H) 11/28/2020 0559   BUN 83 (H) 11/28/2020 0559   CREATININE 5.61 (H) 11/28/2020 0559   CALCIUM 7.4 (L) 11/28/2020 0559   GFRNONAA 8 (L) 11/28/2020 0559   GFRAA >60 09/13/2020 0539   Assessment/Planning: The patient is a 69 year old female recently consulted on to place a temporary dialysis catheter, postop day #1 reconsulted by podiatry for possible amputation  1) Nonviable left foot: Patient with known history of atherosclerotic disease requiring endovascular intervention.  Seen by podiatry yesterday and noted to have nonviable left foot in the setting of osteomyelitis to left heel,  full-thickness ulceration to the heel with necrosis of muscle.  Podiatry feels the patient's left foot is nonviable at this point and is requesting consultation for amputation.  Due to the patient's paraplegia and bedbound status she is not a candidate for a below the knee amputation and would recommend above-the-knee to avoid contracture.  Procedure, risks and benefits were explained to the patient and her husband who is at the bedside.  All questions were answered.  Patient wishes to proceed.  2) Acute on chronic renal failure: Now has temporary dialysis catheter We will place PermCath when clear of infection  Discussed with Dr. [facility] PA-C 11/29/2020 11:05 AM

## 2020-11-30 ENCOUNTER — Inpatient Hospital Stay: Payer: Medicare HMO | Admitting: Certified Registered Nurse Anesthetist

## 2020-11-30 ENCOUNTER — Encounter: Payer: Self-pay | Admitting: Internal Medicine

## 2020-11-30 ENCOUNTER — Encounter
Admission: EM | Disposition: A | Discharge status: Hospice | Payer: Self-pay | Source: Home / Self Care | Attending: Internal Medicine

## 2020-11-30 DIAGNOSIS — K7469 Other cirrhosis of liver: Secondary | ICD-10-CM

## 2020-11-30 DIAGNOSIS — M86672 Other chronic osteomyelitis, left ankle and foot: Secondary | ICD-10-CM

## 2020-11-30 DIAGNOSIS — M86272 Subacute osteomyelitis, left ankle and foot: Secondary | ICD-10-CM | POA: Diagnosis not present

## 2020-11-30 DIAGNOSIS — I480 Paroxysmal atrial fibrillation: Secondary | ICD-10-CM

## 2020-11-30 DIAGNOSIS — N179 Acute kidney failure, unspecified: Secondary | ICD-10-CM | POA: Diagnosis not present

## 2020-11-30 DIAGNOSIS — D6869 Other thrombophilia: Secondary | ICD-10-CM | POA: Diagnosis not present

## 2020-11-30 DIAGNOSIS — N186 End stage renal disease: Secondary | ICD-10-CM

## 2020-11-30 HISTORY — PX: APPLICATION OF WOUND VAC: SHX5189

## 2020-11-30 HISTORY — PX: AMPUTATION: SHX166

## 2020-11-30 LAB — BASIC METABOLIC PANEL
Anion gap: 11 (ref 5–15)
BUN: 55 mg/dL — ABNORMAL HIGH (ref 8–23)
CO2: 26 mmol/L (ref 22–32)
Calcium: 7.5 mg/dL — ABNORMAL LOW (ref 8.9–10.3)
Chloride: 100 mmol/L (ref 98–111)
Creatinine, Ser: 4.27 mg/dL — ABNORMAL HIGH (ref 0.44–1.00)
GFR, Estimated: 11 mL/min — ABNORMAL LOW (ref >60)
Glucose, Bld: 92 mg/dL (ref 70–99)
Potassium: 3.8 mmol/L (ref 3.5–5.1)
Sodium: 137 mmol/L (ref 135–145)

## 2020-11-30 LAB — GLUCOSE, CAPILLARY
Glucose-Capillary: 88 mg/dL (ref 70–99)
Glucose-Capillary: 100 mg/dL — ABNORMAL HIGH (ref 70–99)
Glucose-Capillary: 134 mg/dL — ABNORMAL HIGH (ref 70–99)
Glucose-Capillary: 130 mg/dL — ABNORMAL HIGH (ref 70–99)

## 2020-11-30 LAB — CBC
HCT: 29 % — ABNORMAL LOW (ref 36.0–46.0)
Hemoglobin: 8.9 g/dL — ABNORMAL LOW (ref 12.0–15.0)
MCH: 24.3 pg — ABNORMAL LOW (ref 26.0–34.0)
MCHC: 30.7 g/dL (ref 30.0–36.0)
MCV: 79 fL — ABNORMAL LOW (ref 80.0–100.0)
Platelets: 191 10*3/uL (ref 150–400)
RBC: 3.67 MIL/uL — ABNORMAL LOW (ref 3.87–5.11)
RDW: 17 % — ABNORMAL HIGH (ref 11.5–15.5)
WBC: 10.4 10*3/uL (ref 4.0–10.5)
nRBC: 0 % (ref 0.0–0.2)

## 2020-11-30 LAB — APTT: aPTT: 51 seconds — ABNORMAL HIGH (ref 24–36)

## 2020-11-30 LAB — PROTIME-INR
INR: 2.1 — ABNORMAL HIGH (ref 0.8–1.2)
Prothrombin Time: 23.1 seconds — ABNORMAL HIGH (ref 11.4–15.2)

## 2020-11-30 LAB — ABO/RH: ABO/RH(D): B NEG

## 2020-11-30 LAB — MAGNESIUM: Magnesium: 1.5 mg/dL — ABNORMAL LOW (ref 1.7–2.4)

## 2020-11-30 SURGERY — AMPUTATION, ABOVE KNEE
Anesthesia: General | Site: Leg Upper | Laterality: Left

## 2020-11-30 MED ORDER — PROPOFOL 10 MG/ML IV BOLUS
INTRAVENOUS | Status: AC
  Filled 2020-11-30: qty 20

## 2020-11-30 MED ORDER — HYDROCODONE-ACETAMINOPHEN 7.5-325 MG PO TABS: 1.0000 | ORAL_TABLET | Freq: Once | ORAL | Status: DC | PRN

## 2020-11-30 MED ORDER — ACETAMINOPHEN 325 MG PO TABS: 325.0000 mg | ORAL_TABLET | ORAL | Status: DC | PRN

## 2020-11-30 MED ORDER — SODIUM CHLORIDE 0.9% IV SOLUTION: Freq: Once | INTRAVENOUS | Status: AC

## 2020-11-30 MED ORDER — DROPERIDOL 2.5 MG/ML IJ SOLN
0.6250 mg | Freq: Once | INTRAMUSCULAR | Status: DC | PRN
  Filled 2020-11-30: qty 2

## 2020-11-30 MED ORDER — PROMETHAZINE HCL 25 MG/ML IJ SOLN: 6.2500 mg | INTRAMUSCULAR | Status: DC | PRN

## 2020-11-30 MED ORDER — ACETAMINOPHEN 325 MG PO TABS
650.0000 mg | ORAL_TABLET | Freq: Once | ORAL | Status: AC
  Administered 2020-11-30: 650 mg via ORAL

## 2020-11-30 MED ORDER — LIDOCAINE HCL (PF) 2 % IJ SOLN
INTRAMUSCULAR | Status: AC
  Filled 2020-11-30: qty 5

## 2020-11-30 MED ORDER — DIPHENHYDRAMINE HCL 50 MG/ML IJ SOLN
25.0000 mg | Freq: Once | INTRAMUSCULAR | Status: AC
  Administered 2020-11-30: 25 mg via INTRAVENOUS

## 2020-11-30 MED ORDER — FENTANYL CITRATE (PF) 100 MCG/2ML IJ SOLN
INTRAMUSCULAR | Status: AC
  Filled 2020-11-30: qty 2

## 2020-11-30 MED ORDER — DEXAMETHASONE SODIUM PHOSPHATE 10 MG/ML IJ SOLN
INTRAMUSCULAR | Status: DC | PRN
  Administered 2020-11-30: 4 mg via INTRAVENOUS

## 2020-11-30 MED ORDER — ROCURONIUM BROMIDE 100 MG/10ML IV SOLN
INTRAVENOUS | Status: DC | PRN
  Administered 2020-11-30: 40 mg via INTRAVENOUS

## 2020-11-30 MED ORDER — PROPOFOL 10 MG/ML IV BOLUS
INTRAVENOUS | Status: DC | PRN
  Administered 2020-11-30: 80 mg via INTRAVENOUS

## 2020-11-30 MED ORDER — DEXAMETHASONE SODIUM PHOSPHATE 10 MG/ML IJ SOLN
INTRAMUSCULAR | Status: AC
  Filled 2020-11-30: qty 1

## 2020-11-30 MED ORDER — MAGNESIUM SULFATE 2 GM/50ML IV SOLN
2.0000 g | Freq: Once | INTRAVENOUS | Status: AC
  Administered 2020-11-30: 2 g via INTRAVENOUS
  Filled 2020-11-30: qty 50

## 2020-11-30 MED ORDER — LIDOCAINE HCL (CARDIAC) PF 100 MG/5ML IV SOSY
PREFILLED_SYRINGE | INTRAVENOUS | Status: DC | PRN
  Administered 2020-11-30: 100 mg via INTRAVENOUS

## 2020-11-30 MED ORDER — ONDANSETRON HCL 4 MG/2ML IJ SOLN
INTRAMUSCULAR | Status: DC | PRN
  Administered 2020-11-30: 4 mg via INTRAVENOUS

## 2020-11-30 MED ORDER — ALBUMIN HUMAN 25 % IV SOLN
25.0000 g | Freq: Once | INTRAVENOUS | Status: AC
  Administered 2020-11-30: 25 g via INTRAVENOUS
  Filled 2020-11-30: qty 100

## 2020-11-30 MED ORDER — PHENYLEPHRINE HCL (PRESSORS) 10 MG/ML IV SOLN
INTRAVENOUS | Status: DC | PRN
  Administered 2020-11-30: 100 ug via INTRAVENOUS
  Administered 2020-11-30 (×2): 200 ug via INTRAVENOUS
  Administered 2020-11-30: 100 ug via INTRAVENOUS

## 2020-11-30 MED ORDER — FENTANYL CITRATE (PF) 100 MCG/2ML IJ SOLN
25.0000 ug | INTRAMUSCULAR | Status: DC | PRN
  Administered 2020-11-30: 50 ug via INTRAVENOUS
  Administered 2020-11-30 (×2): 25 ug via INTRAVENOUS

## 2020-11-30 MED ORDER — ROCURONIUM BROMIDE 10 MG/ML (PF) SYRINGE
PREFILLED_SYRINGE | INTRAVENOUS | Status: AC
  Filled 2020-11-30: qty 10

## 2020-11-30 MED ORDER — ONDANSETRON HCL 4 MG/2ML IJ SOLN
INTRAMUSCULAR | Status: AC
  Filled 2020-11-30: qty 2

## 2020-11-30 MED ORDER — FENTANYL CITRATE (PF) 100 MCG/2ML IJ SOLN
INTRAMUSCULAR | Status: DC | PRN
  Administered 2020-11-30 (×2): 50 ug via INTRAVENOUS

## 2020-11-30 MED ORDER — ACETAMINOPHEN 160 MG/5ML PO SOLN
325.0000 mg | ORAL | Status: DC | PRN
  Filled 2020-11-30: qty 20.3

## 2020-11-30 MED ORDER — SUGAMMADEX SODIUM 200 MG/2ML IV SOLN
INTRAVENOUS | Status: DC | PRN
  Administered 2020-11-30: 300 mg via INTRAVENOUS

## 2020-11-30 SURGICAL SUPPLY — 43 items
BLADE SAGITTAL WIDE XTHICK NO (BLADE) ×4 IMPLANT
BNDG COHESIVE 4X5 TAN STRL (GAUZE/BANDAGES/DRESSINGS) ×4 IMPLANT
BNDG ELASTIC 6X5.8 VLCR NS LF (GAUZE/BANDAGES/DRESSINGS) ×4 IMPLANT
BNDG ELASTIC 6X5.8 VLCR STR LF (GAUZE/BANDAGES/DRESSINGS) ×2 IMPLANT
BNDG GAUZE 4.5X4.1 6PLY STRL (MISCELLANEOUS) ×8 IMPLANT
BNDG STRETCH 4X75 STRL LF (GAUZE/BANDAGES/DRESSINGS) ×2 IMPLANT
BRUSH SCRUB EZ  4% CHG (MISCELLANEOUS) ×2
BRUSH SCRUB EZ 4% CHG (MISCELLANEOUS) ×2 IMPLANT
CANISTER SUCT 1200ML W/VALVE (MISCELLANEOUS) ×4 IMPLANT
CANISTER WOUND CARE 500ML ATS (WOUND CARE) ×2 IMPLANT
CHLORAPREP W/TINT 26ML (MISCELLANEOUS) ×6 IMPLANT
COVER WAND RF STERILE (DRAPES) ×4 IMPLANT
DRAPE INCISE IOBAN 66X45 STRL (DRAPES) ×4 IMPLANT
DRAPE INCISE IOBAN 66X60 STRL (DRAPES) ×4 IMPLANT
ELECT CAUTERY BLADE 6.4 (BLADE) ×4 IMPLANT
ELECT REM PT RETURN 9FT ADLT (ELECTROSURGICAL) ×4
ELECTRODE REM PT RTRN 9FT ADLT (ELECTROSURGICAL) ×2 IMPLANT
GAUZE XEROFORM 1X8 LF (GAUZE/BANDAGES/DRESSINGS) ×6 IMPLANT
GLOVE BIO SURGEON STRL SZ7 (GLOVE) ×8 IMPLANT
GLOVE INDICATOR 7.5 STRL GRN (GLOVE) ×4 IMPLANT
GOWN STRL REUS W/ TWL LRG LVL3 (GOWN DISPOSABLE) ×2 IMPLANT
GOWN STRL REUS W/ TWL XL LVL3 (GOWN DISPOSABLE) ×4 IMPLANT
GOWN STRL REUS W/TWL LRG LVL3 (GOWN DISPOSABLE) ×2
GOWN STRL REUS W/TWL XL LVL3 (GOWN DISPOSABLE) ×4
HANDLE YANKAUER SUCT BULB TIP (MISCELLANEOUS) ×4 IMPLANT
KIT PREVENA INCISION MGT20CM45 (CANNISTER) ×2 IMPLANT
KIT TURNOVER KIT A (KITS) ×4 IMPLANT
LABEL OR SOLS (LABEL) ×4 IMPLANT
MANIFOLD NEPTUNE II (INSTRUMENTS) ×4 IMPLANT
NS IRRIG 1000ML POUR BTL (IV SOLUTION) ×4 IMPLANT
PACK EXTREMITY (MISCELLANEOUS) ×4 IMPLANT
PAD ABD DERMACEA PRESS 5X9 (GAUZE/BANDAGES/DRESSINGS) ×6 IMPLANT
PAD PREP 24X41 OB/GYN DISP (PERSONAL CARE ITEMS) ×4 IMPLANT
SPONGE LAP 18X18 RF (DISPOSABLE) ×10 IMPLANT
STAPLER SKIN PROX 35W (STAPLE) ×4 IMPLANT
STOCKINETTE M/LG 89821 (MISCELLANEOUS) ×4 IMPLANT
SUT ETHILON 3-0 FS-10 30 BLK (SUTURE) ×4
SUT SILK 2 0 (SUTURE) ×2
SUT SILK 2 0 SH (SUTURE) ×8 IMPLANT
SUT SILK 2-0 18XBRD TIE 12 (SUTURE) ×2 IMPLANT
SUT VIC AB 0 CT1 36 (SUTURE) ×10 IMPLANT
SUT VIC AB 2-0 CT1 (SUTURE) ×8 IMPLANT
SUTURE EHLN 3-0 FS-10 30 BLK (SUTURE) IMPLANT

## 2020-11-30 NOTE — Progress Notes (Signed)
[name], female   DOB: [birth year], 69 y.o.   MRN: [id number] [facility] PROGRESS NOTE  Brooke Hopkins ZYS:[id number] DOB: Feb 20, 1951 DOA: 11/24/2020 PCP: Lynnell Jude, MD  HPI/Subjective: Patient answers some yes or no questions. Does not elaborate. States she feels okay. Still has some right knee pain. Going to the OR today for amputation. Admitted 11/24/2020 with new onset atrial fibrillation and acute kidney injury.  Objective: Vitals:   11/30/20 0753 11/30/20 1027  BP: (!) 135/59 (!) 151/60  Pulse: 75 79  Resp: 16 16  Temp: 98.4 F (36.9 C) 98.4 F (36.9 C)  SpO2: 96% 97%    Intake/Output Summary (Last 24 hours) at 11/30/2020 1056 Last data filed at 11/29/2020 1933 Gross per 24 hour  Intake --  Output 500 ml  Net -500 ml   Filed Weights   11/28/20 0435 11/29/20 0327 11/30/20 0309  Weight: 132 kg 122.9 kg 117.9 kg    ROS: Review of Systems  Respiratory: Negative for cough and shortness of breath.   Cardiovascular: Negative for chest pain.  Gastrointestinal: Negative for abdominal pain, nausea and vomiting.   Exam: Physical Exam HENT:     Head: Normocephalic.     Mouth/Throat:     Pharynx: No oropharyngeal exudate.  Eyes:     General: Lids are normal.     Conjunctiva/sclera: Conjunctivae normal.     Pupils: Pupils are equal, round, and reactive to light.  Cardiovascular:     Rate and Rhythm: Normal rate and regular rhythm.     Heart sounds: Normal heart sounds, S1 normal and S2 normal.  Pulmonary:     Breath sounds: No decreased breath sounds, wheezing or rhonchi.  Abdominal:     Palpations: Abdomen is soft.     Tenderness: There is no abdominal tenderness.  Musculoskeletal:     Right lower leg: Swelling present.     Left lower leg: Swelling present.  Skin:    General: Skin is warm.     Comments: Left leg covered.  Neurological:     Mental Status: She is alert.     Comments: Answers some yes or no questions.        Data Reviewed: Basic Metabolic Panel: Recent Labs  Lab 11/25/20 0723 11/26/20 0502 11/27/20 0629 11/28/20 0559 11/30/20 0605  NA 140 140 137 136 137  K 4.2 4.1 4.0 3.9 3.8  CL 108 105 101 98 100  CO2 19* 19* 21* 23 26  GLUCOSE 89 165* 198* 125* 92  BUN 79* 80* 81* 83* 55*  CREATININE 4.88* 5.08* 5.40* 5.61* 4.27*  CALCIUM 7.9* 7.3* 7.4* 7.4* 7.5*  MG 1.8  --   --   --  1.5*   Liver Function Tests: Recent Labs  Lab 11/24/20 0934 11/26/20 0502 11/27/20 0629 11/28/20 0559  AST 463* 160* 89* 56*  ALT 401* 273* 209* 161*  ALKPHOS 105 89 85 79  BILITOT 1.3* 0.7 1.0 0.6  PROT 6.3* 5.6* 5.4* 5.5*  ALBUMIN 2.8* 2.4* 2.3* 2.1*   CBC: Recent Labs  Lab 11/25/20 0723 11/26/20 0502 11/27/20 0629 11/28/20 0559 11/30/20 0605  WBC 9.0 10.8* 12.5* 13.8* 10.4  NEUTROABS  --  8.6* 10.3* 10.8*  --   HGB 9.2* 9.5* 9.0* 8.6* 8.9*  HCT 30.8* 31.2* 29.7* 27.8* 29.0*  MCV 83.5 82.5 81.8 79.9* 79.0*  PLT 230 217 195 196 191   BNP (last 3 results) Recent Labs    09/13/20 1046  BNP 233.8*  CBG: Recent Labs  Lab 11/29/20 0756 11/29/20 1300 11/29/20 1624 11/29/20 2023 11/30/20 0754  GLUCAP 86 115* 135* 116* 88    Recent Results (from the past 240 hour(s))  Resp Panel by RT-PCR (Flu A&B, Covid) Nasopharyngeal Swab     Status: None   Collection Time: 11/24/20  9:35 AM   Specimen: Nasopharyngeal Swab; Nasopharyngeal(NP) swabs in vial transport medium  Result Value Ref Range Status   SARS Coronavirus 2 by RT PCR NEGATIVE NEGATIVE Final    Comment: (NOTE) SARS-CoV-2 target nucleic acids are NOT DETECTED.  The SARS-CoV-2 RNA is generally detectable in upper respiratory specimens during the acute phase of infection. The lowest concentration of SARS-CoV-2 viral copies this assay can detect is 138 copies/mL. A negative result does not preclude SARS-Cov-2 infection and should not be used as the sole basis for treatment or other patient management decisions. A  negative result may occur with  improper specimen collection/handling, submission of specimen other than nasopharyngeal swab, presence of viral mutation(s) within the areas targeted by this assay, and inadequate number of viral copies(<138 copies/mL). A negative result must be combined with clinical observations, patient history, and epidemiological information. The expected result is Negative.  Fact Sheet for Patients:  EntrepreneurPulse.com.au  Fact Sheet for Healthcare Providers:  IncredibleEmployment.be  This test is no t yet approved or cleared by the [place] FDA and  has been authorized for detection and/or diagnosis of SARS-CoV-2 by FDA under an Emergency Use Authorization (EUA). This EUA will remain  in effect (meaning this test can be used) for the duration of the COVID-19 declaration under Section 564(b)(1) of the Act, 21 U.S.C.section 360bbb-3(b)(1), unless the authorization is terminated  or revoked sooner.       Influenza A by PCR NEGATIVE NEGATIVE Final   Influenza B by PCR NEGATIVE NEGATIVE Final    Comment: (NOTE) The Xpert Xpress SARS-CoV-2/FLU/RSV plus assay is intended as an aid in the diagnosis of influenza from Nasopharyngeal swab specimens and should not be used as a sole basis for treatment. Nasal washings and aspirates are unacceptable for Xpert Xpress SARS-CoV-2/FLU/RSV testing.  Fact Sheet for Patients: EntrepreneurPulse.com.au  Fact Sheet for Healthcare Providers: IncredibleEmployment.be  This test is not yet approved or cleared by the [place] FDA and has been authorized for detection and/or diagnosis of SARS-CoV-2 by FDA under an Emergency Use Authorization (EUA). This EUA will remain in effect (meaning this test can be used) for the duration of the COVID-19 declaration under Section 564(b)(1) of the Act, 21 U.S.C. section 360bbb-3(b)(1), unless the authorization  is terminated or revoked.  Performed at [facility], [address] [place], [place] [address]      Studies: DG Knee 1-2 Views Right  Result Date: 11/29/2020 CLINICAL DATA:  Fall. EXAM: RIGHT KNEE - 1-2 VIEW COMPARISON:  04/30/2017 FINDINGS: Suboptimal study due to technique and positioning. Generalized osteopenia.  No fracture identified Tricompartmental degenerative change with joint space narrowing and spurring most severe in the patellofemoral joint. Chondrocalcinosis. Arterial calcification IMPRESSION: Negative for fracture.  Tricompartmental degenerative change. Electronically Signed   By: Franchot Gallo M.D.   On: 11/29/2020 10:28   PERIPHERAL VASCULAR CATHETERIZATION  Result Date: 11/28/2020 See Op Note  DG Chest Port 1 View  Result Date: 11/28/2020 CLINICAL DATA:  Central line placement. EXAM: PORTABLE CHEST 1 VIEW COMPARISON:  November 24, 2020. FINDINGS: Stable cardiomegaly. Interval placement of left internal jugular catheter with distal tip in expected position of the right atrium. No pneumothorax is  noted. Mild bibasilar subsegmental atelectasis is noted. Small right pleural effusion may be present. Bony thorax is unremarkable. IMPRESSION: Interval placement of left internal jugular catheter with distal tip in expected position of the right atrium. No pneumothorax is noted. Mild bibasilar subsegmental atelectasis is noted. Electronically Signed   By: Marijo Conception M.D.   On: 11/28/2020 14:55    Scheduled Meds: . acetaminophen  650 mg Oral Once  . amiodarone  400 mg Oral BID  . Chlorhexidine Gluconate Cloth  6 each Topical Q0600  . Chlorhexidine Gluconate Cloth  6 each Topical Once  . collagenase   Topical Daily  . diphenhydrAMINE  25 mg Intravenous Once  . feeding supplement (NEPRO CARB STEADY)  237 mL Oral BID BM  . insulin aspart  0-6 Units Subcutaneous TID WC   Continuous Infusions: . sodium chloride 250 mL (11/29/20 1556)  .  ceFAZolin (ANCEF) IV 1  g (11/29/20 1559)  .  ceFAZolin (ANCEF) IV      Assessment/Plan:  1. Left heel osteomyelitis with full-thickness ulceration with necrosis of muscle and leukocytosis.  Vascular surgery to take to the operating room today.  Holding Eliquis and Plavix.  Empiric Ancef.  Vascular team ordered adnexa and FFP prior to the procedure.  No contraindications to procedure today. 2. Acute renal failure on chronic kidney disease stage IIIa.  Creatinine Increasing on a Daily Basis and on 1130 Was As High As 5.61.  Baseline Creatinine 1.03.  (Creatinine Increased Greater Than 1.5 Times Baseline within 7 Days).  Patient Had 2 Dialysis Sessions.  Temporary Catheter Placed by Vascular Surgery. 3. Paroxysmal atrial fibrillation.  Holding Eliquis for procedure today.  On oral amiodarone. 4. Acquired thrombophilia.  Stroke risk higher with paroxysmal atrial fibrillation.  Eliquis will be restarted after procedure to reduce stroke risk. 5. Stage III decubitus ulcer on buttock.  Present on admission.  See description below. 6. Cirrhosis of liver seen on imaging study with elevated liver function test.  Continue to monitor closely while on amiodarone. 7. Bedbound status 8. Right knee pain.  X-ray showing tricompartmental degenerative changes. 9. Morbid obesity with a BMI of 40.72  Pressure Injury 11/26/20 Buttocks Left Stage 3 -  Full thickness tissue loss. Subcutaneous fat may be visible but bone, tendon or muscle are NOT exposed. (Active)  11/26/20 1313  Location: Buttocks  Location Orientation: Left  Staging: Stage 3 -  Full thickness tissue loss. Subcutaneous fat may be visible but bone, tendon or muscle are NOT exposed.  Wound Description (Comments):   Present on Admission:      Pressure Injury 11/26/20 Heel Right Unstageable - Full thickness tissue loss in which the base of the injury is covered by slough (yellow, tan, gray, green or brown) and/or eschar (tan, brown or black) in the wound bed. (Active)   11/26/20 1314  Location: Heel  Location Orientation: Right  Staging: Unstageable - Full thickness tissue loss in which the base of the injury is covered by slough (yellow, tan, gray, green or brown) and/or eschar (tan, brown or black) in the wound bed.  Wound Description (Comments):   Present on Admission:        Code Status:     Code Status Orders  (From admission, onward)         Start     Ordered   11/24/20 1310  Do not attempt resuscitation (DNR)  Continuous       Question Answer Comment  In the event of cardiac or respiratory  ARREST Do not call a "code blue"   In the event of cardiac or respiratory ARREST Do not perform Intubation, CPR, defibrillation or ACLS   In the event of cardiac or respiratory ARREST Use medication by any route, position, wound care, and other measures to relive pain and suffering. May use oxygen, suction and manual treatment of airway obstruction as needed for comfort.   Comments Code status was discussed with patient and she is a DNR      11/24/20 1313        Code Status History    Date Active Date Inactive Code Status Order ID Comments User Context   09/13/2020 0336 09/14/2020 1648 Full Code [id number]  Sidney Ace Arvella Merles, MD ED   08/29/2020 1654 09/05/2020 2107 Full Code [id number]  Vashti Hey, MD ED   06/16/2017 1852 06/20/2017 2044 Full Code [id number]  Saundra Shelling, MD Inpatient   08/16/2016 2029 08/20/2016 0225 Full Code [id number]  Demetrios Loll, MD Inpatient   10/13/2015 2210 10/17/2015 1831 Full Code [id number]  Aldean Jewett, MD Inpatient   Advance Care Planning Activity     Family Communication: Husband at bedside Disposition Plan: Status is: Inpatient  Dispo: The patient is from: Home              Anticipated d/c is to: Likely will need rehab              Anticipated d/c date is: Likely early next week.  We will need to recover from amputation surgery today.  Likely will need rehab and an outpatient dialysis slot.               Patient currently not medically stable.  Consultants:  Vascular surgery  Nephrology  Cardiology  Podiatry  Antibiotics:  Ancef  Time spent: 27 minutes  [place]

## 2020-11-30 NOTE — Op Note (Signed)
[facility]   OPERATIVE NOTE   PROCEDURE:  Left above-the-knee amputation  PRE-OPERATIVE DIAGNOSIS: Left foot osteomyelitis, stroke, bedbound  POST-OPERATIVE DIAGNOSIS: same as above  SURGEON:  Leotis Pain, MD  ASSISTANT(S): Hezzie Bump, PA-C  ANESTHESIA: general  ESTIMATED BLOOD LOSS: 50 cc  FINDING(S): A huge amount of subcutaneous and deep tissue edema was present.  For this reason, we elected to place a Prevena VAC on the incision at the end of the procedure.  SPECIMEN(S):  Left above-the-knee amputation  INDICATIONS:   Brooke Hopkins is a 69 y.o. female who presents with left foot osteomyelitis and nonhealing wounds.  The patient is bedbound with no ambulatory status.  The patient is scheduled for a left above-the-knee amputation.  I discussed in depth with the patient the risks, benefits, and alternatives to this procedure.  The patient is aware that the risk of this operation included but are not limited to:  bleeding, infection, myocardial infarction, stroke, death, failure to heal amputation wound, and possible need for more proximal amputation.  The patient is aware of the risks and agrees proceed forward with the procedure.  DESCRIPTION: After full informed written consent was obtained from the patient, the patient was taken to the operating room, and placed supine upon the operating table.  Prior to induction, the patient received IV antibiotics.  The patient was then prepped and draped in the standard fashion for a left above-the-knee amputation.  After obtaining adequate anesthesia, the patient was prepped and draped in the standard fashion for a above-the-knee amputation.  I marked out the anterior and posterior flaps for a fish-mouth type of amputation.  I made the incisions for these flaps, and then dissected through the subcutaneous tissue, fascia, and muscles circumferentially.  I elevated  the periosteal tissue 4-5 cm more proximal than  the anterior skin flap.  I then transected the femur with a power saw at this level.  Then I smoothed out the rough edges of the bone.  At this point, the specimen was passed off the field as the above-the-knee amputation.  At this point, I clamped all visibly bleeding arteries and veins using a combination of suture ligation with silk suture and electrocautery.   Bleeding continued to be controlled with electrocautery and suture ligature.  The stump was washed off with sterile normal saline and no further active bleeding was noted.  I reapproximated the anterior and posterior fascia  with interrupted stitches of 0 Vicryl.  This was completed along the entire length of anterior and posterior fascia until there were no more loose space in the fascial line.  The large amount of fluid created somewhat of a cavity particularly medially.  The skin incision was leveled out to be able to close this with a lesser amount of pocket.  A large amount of unhealthy adipose tissue was removed as well.  The subcutaneous tissue was then approximated with 2-0 vicryl sutures. The skin was then  reapproximated with staples as well as nylon sutures.  The stump was washed off and dried.  An incisional VAC was then placed over the wound.  The incision was dressed with Xeroform and ABD pads, and  then fluffs were applied.  Kerlix was wrapped around the leg and then gently an ACE wrap was applied.  A large Ioban was then placed over the ACE wrap to secure the dressing. The patient was then awakened and take to the recovery room in stable condition.   COMPLICATIONS: none  CONDITION: stable  Leotis Pain  11/30/2020, 2:18 PM   This note was created with Dragon Medical transcription system. Any errors in dictation are purely unintentional.

## 2020-11-30 NOTE — Transfer of Care (Signed)
Immediate Anesthesia Transfer of Care Note  Patient: Brooke Hopkins  Procedure(s) Performed: AMPUTATION ABOVE KNEE (Left Knee) APPLICATION OF WOUND VAC (Left Leg Upper)  Patient Location: PACU  Anesthesia Type:General  Level of Consciousness: awake, alert  and oriented  Airway & Oxygen Therapy: Patient Spontanous Breathing and Patient connected to face mask oxygen  Post-op Assessment: Report given to RN and Post -op Vital signs reviewed and stable  Post vital signs: Reviewed and stable  Last Vitals:  Vitals Value Taken Time  BP 143/75 11/30/20 1447  Temp    Pulse 75 11/30/20 1451  Resp 23 11/30/20 1451  SpO2 100 % 11/30/20 1451  Vitals shown include unvalidated device data.  Last Pain:  Vitals:   11/30/20 1256  TempSrc: Oral  PainSc:          Complications: No complications documented.

## 2020-11-30 NOTE — Anesthesia Preprocedure Evaluation (Addendum)
Anesthesia Evaluation  Patient identified by MRN, date of birth, ID band Patient awake    Reviewed: Allergy & Precautions, NPO status , Patient's Chart, lab work & pertinent test results  History of Anesthesia Complications Negative for: history of anesthetic complications  Airway Mallampati: III  TM Distance: <3 FB     Dental  (+) Upper Dentures, Lower Dentures   Pulmonary neg pulmonary ROS, neg sleep apnea, neg COPD, Not current smoker,           Cardiovascular hypertension, Pt. on medications + Peripheral Vascular Disease and +CHF (LVEF 35%)  (-) Past MI + dysrhythmias Atrial Fibrillation      Neuro/Psych  Neuromuscular disease CVA (L sided weakness), Residual Symptoms negative psych ROS   GI/Hepatic GERD  Medicated,Elevated enzymes probably due to congestion from afib   Endo/Other  diabetes, Type 2, Insulin Dependent  Renal/GU Renal disease (stones)  negative genitourinary   Musculoskeletal   Abdominal   Peds negative pediatric ROS (+)  Hematology negative hematology ROS (+)   Anesthesia Other Findings Past Medical History: No date: Chronic kidney disease No date: Diabetes mellitus No date: Hypertension No date: Stroke [facility])  Reproductive/Obstetrics                             Anesthesia Physical  Anesthesia Plan  ASA: IV  Anesthesia Plan: General   Post-op Pain Management:    Induction: Intravenous  PONV Risk Score and Plan: 3  Airway Management Planned: Oral ETT  Additional Equipment:   Intra-op Plan:   Post-operative Plan: Extubation in OR and Possible Post-op intubation/ventilation  Informed Consent: I have reviewed the patients History and Physical, chart, labs and discussed the procedure including the risks, benefits and alternatives for the proposed anesthesia with the patient or authorized representative who has indicated his/her understanding and acceptance.        Plan Discussed with: CRNA and Surgeon  Anesthesia Plan Comments:        Anesthesia Quick Evaluation

## 2020-11-30 NOTE — Progress Notes (Deleted)
Office Visit    Patient Name: Brooke Hopkins Date of Encounter: 11/30/2020  Primary Care Provider:  Lynnell Jude, MD Primary Cardiologist:  Kate Sable, MD  Chief Complaint    No chief complaint on file.   69 y.o. female with history of hypertension, DM2, CVA, PAD s/p left popliteal and AT stenting, obesity, CKD 3, chronic lymphedema, decubitus ulcers, and who is being seen today after admission 11/26 with hyperglycemia, atrial fibrillation with RVR, and hypotension requiring emergent DCCV in the emergency room with restoration of NSR but converting back to atrial fibrillation 11/27 at 9 AM with second DCCV 11/29 and LLE amputation 11/30/20.  Past Medical History    Past Medical History:  Diagnosis Date  . Chronic kidney disease   . Diabetes mellitus   . Hypertension   . Stroke [facility])    Past Surgical History:  Procedure Laterality Date  . ABDOMINAL HYSTERECTOMY    . CARDIOVERSION N/A 11/27/2020   Procedure: CARDIOVERSION;  Surgeon: Wellington Hampshire, MD;  Location: [facility] ORS;  Service: Cardiovascular;  Laterality: N/A;  . CHOLECYSTECTOMY    . HAMMER TOE SURGERY    . KNEE ARTHROSCOPY    . LOWER EXTREMITY ANGIOGRAPHY Left 09/01/2020   Procedure: Lower Extremity Angiography;  Surgeon: Katha Cabal, MD;  Location: [place] [facility];  Service: Cardiovascular;  Laterality: Left;  . LUMBAR DISC SURGERY  11/07/11  . LUMBAR WOUND DEBRIDEMENT  11/27/2011   Procedure: LUMBAR WOUND DEBRIDEMENT;  Surgeon: Eustace Moore;  Location: [place] [facility] OR[facility];  Service: Neurosurgery;  Laterality: N/A;  . TEMPORARY DIALYSIS CATHETER N/A 11/28/2020   Procedure: TEMPORARY DIALYSIS CATHETER;  Surgeon: Katha Cabal, MD;  Location: [place] [facility];  Service: Cardiovascular;  Laterality: N/A;  . TONSILLECTOMY      Allergies  No Known Allergies  History of Present Illness    Brooke Hopkins is a 69 y.o. female with PMH as above.   She was recently admitted to [facility]  11/24/2020 and found to be in new onset atrial fibrillation with RVR up to 171 bpm.  She was started on a Cardizem drip but developed hypotension.  She then underwent cardioversion in the emergency department with restoration of NSR; however, she was back in atrial fibrillation 11/25/2020 with need for second cardioversion.  She was started on amiodarone, given she was at high risk for recurrent atrial fibrillation.  Due to elevated liver enzymes, her statin was held with plan for restart once LFTs returned to normal.  L side IJ was inserted for temporary HD. She was evaluated by podiatry due to left foot ulcer with recommendation for above-the-knee amputation, performed 11/30/2020.  In this setting, anticoagulation and antiplatelet therapy were held.    ----- Atrial fibrillation with RVR --Denies any racing heart rate or palpitations.  S/p DCCV 11/27/2020 and maintaining NSR on amiodarone.  As previously noted, she is at high risk for recurrent atrial fibrillation.   --Continue po amiodarone 400 mg twice daily for 1 week followed by 200 mg once daily thereafter.  Monitor LFTs on amiodarone given transaminitis.  --CHA2DS2VASc score of at least 7. Anticoagulation with Eliquis is currently on hold in preparation for her upcoming above-the-knee left side amputation today per the [facility].  --Restart of anticoagulation once felt safe to do so per [facility] after her procedure/surgery. Daily CBC.  Elevated troponin --No chest pain. HS Tn elevated and suspected to be supply demand ischemia in the setting of atrial fibrillation with rapid ventricular  rate. Updated echo, however, as above with reduced EF from previous and will need further workup once pt has recovered from her amputation.  --No ASA given Eliquis and Plavix, which are currently held due to upcoming L sided amputation as above with restart as soon as felt safe to do so by VVS.   --Statin on held due to elevated LFTs and can be restarted once LFTs  wnl. --No plan for ischemic work-up at this time. Recommend further workup of reduced EF once pt is able to be back on antiplatelet therapy and recovered from her surgery.  Acute renal failure --Suspected likely 2/2 ATN component.  Followed by nephrology.  Left sided IJ catheter placed for temporary HD.  History of strokes -Plavix and Eliquis currently on hold 2/2 upcoming amputation.  As above, recommend restart once felt safe to do so per [facility]. Previously on Lipitor, held due to elevated LFTs.   PAD -Prior intervention of lower extremities, including left popliteal and AT stenting.  Followed by vascular surgery.  Upcoming left lower extremity amputation today as above. Eliquis and Plavix currently on hold 2/2 upcoming amputation.  As above, recommend restart once felt safe to do so per [facility].   Statin held due to LFTs.    Home Medications    Current Facility-Administered Medications on File Prior to Visit  Medication Dose Route Frequency Provider Last Rate Last Admin  . [MAR Hold] 0.9 %  sodium chloride infusion   Intravenous PRN Loletha Grayer, MD 5 mL/hr at 11/30/20 1300 Restarted at 11/30/20 1417  . acetaminophen (TYLENOL) tablet 325-650 mg  325-650 mg Oral Q4H PRN Alphonsus Sias, MD       Or  . acetaminophen (TYLENOL) 160 MG/5ML solution 325-650 mg  325-650 mg Oral Q4H PRN Alphonsus Sias, MD      . Doug Sou Hold] acetaminophen (TYLENOL) tablet 650 mg  650 mg Oral Q4H PRN Agbata, Tochukwu, MD      . Doug Sou Hold] amiodarone (PACERONE) tablet 400 mg  400 mg Oral BID Kate Sable, MD   400 mg at 11/30/20 0944  . [MAR Hold] ceFAZolin (ANCEF) IVPB 1 g/50 mL premix  1 g Intravenous Q24H Dorothe Pea, RPH 100 mL/hr at 11/29/20 1559 1 g at 11/29/20 1559  . [MAR Hold] Chlorhexidine Gluconate Cloth 2 % PADS 6 each  6 each Topical Q0600 Murlean Iba, MD   6 each at 11/30/20 0526  . Chlorhexidine Gluconate Cloth 2 % PADS 6 each  6 each Topical Once Stegmayer, Kimberly A, PA-C      . [MAR  Hold] collagenase (SANTYL) ointment   Topical Daily Nita Sells, MD   Given at 11/29/20 1316  . droperidol (INAPSINE) 2.5 MG/ML injection 0.625 mg  0.625 mg Intravenous Once PRN Arlyss Repress T, MD      . Doug Sou Hold] feeding supplement (NEPRO CARB STEADY) liquid 237 mL  237 mL Oral BID BM Loletha Grayer, MD   237 mL at 11/29/20 1348  . fentaNYL (SUBLIMAZE) 100 MCG/2ML injection           . fentaNYL (SUBLIMAZE) injection 25-50 mcg  25-50 mcg Intravenous Q5 min PRN Alphonsus Sias, MD   50 mcg at 11/30/20 1519  . HYDROcodone-acetaminophen (NORCO) 7.5-325 MG per tablet 1 tablet  1 tablet Oral Once PRN Alphonsus Sias, MD      . Doug Sou Hold] HYDROcodone-acetaminophen (NORCO/VICODIN) 5-325 MG per tablet 1 tablet  1 tablet Oral Q6H PRN Agbata, Tochukwu, MD   1 tablet  at 11/27/20 2217  . [MAR Hold] insulin aspart (novoLOG) injection 0-6 Units  0-6 Units Subcutaneous TID WC Nita Sells, MD   1 Units at 11/27/20 1735  . [MAR Hold] ondansetron (ZOFRAN) injection 4 mg  4 mg Intravenous Q6H PRN Agbata, Tochukwu, MD   4 mg at 11/27/20 1612  . promethazine (PHENERGAN) injection 6.25-12.5 mg  6.25-12.5 mg Intravenous Q15 min PRN Alphonsus Sias, MD       Current Outpatient Medications on File Prior to Visit  Medication Sig Dispense Refill  . aspirin EC 81 MG EC tablet Take 1 tablet (81 mg total) by mouth daily. Swallow whole. 30 tablet 11  . atorvastatin (LIPITOR) 80 MG tablet Take 1 tablet (80 mg total) by mouth daily at 6 PM. 30 tablet 0  . cloNIDine (CATAPRES - DOSED IN MG/24 HR) 0.2 mg/24hr patch Place 0.2 mg onto the skin once a week.    . clopidogrel (PLAVIX) 75 MG tablet Take 75 mg by mouth daily.    . collagenase (SANTYL) ointment Apply topically daily. 15 g 0  . furosemide (LASIX) 20 MG tablet Take 1 tablet (20 mg total) by mouth daily. 30 tablet 11  . hydrALAZINE (APRESOLINE) 25 MG tablet Take 1 tablet (25 mg total) by mouth every 8 (eight) hours. 90 tablet 0  . insulin NPH-regular  Human (NOVOLIN 70/30) (70-30) 100 UNIT/ML injection Inject 10 Units into the skin 2 (two) times daily with a meal. 10 mL 1  . labetalol (NORMODYNE) 100 MG tablet Take 1 tablet (100 mg total) by mouth 2 (two) times daily. 60 tablet 2  . lisinopril (ZESTRIL) 40 MG tablet Take 40 mg by mouth daily.    . metroNIDAZOLE (FLAGYL) 500 MG tablet  (Patient not taking: Reported on 11/24/2020)    . oxyCODONE (OXY IR/ROXICODONE) 5 MG immediate release tablet Take 5 mg by mouth 4 (four) times daily as needed for pain.      Review of Systems    ***.   All other systems reviewed and are otherwise negative except as noted above.  Physical Exam    VS:  There were no vitals taken for this visit. , BMI There is no height or weight on file to calculate BMI. GEN: Well nourished, well developed, in no acute distress. HEENT: normal. Neck: Supple, no JVD, carotid bruits, or masses. Cardiac: RRR, no murmurs, rubs, or gallops. No clubbing, cyanosis, edema.  Radials/DP/PT 2+ and equal bilaterally.  Respiratory:  Respirations regular and unlabored, clear to auscultation bilaterally. GI: Soft, nontender, nondistended, BS + x 4. MS: no deformity or atrophy. Skin: warm and dry, no rash. Neuro:  Strength and sensation are intact. Psych: Normal affect.  Accessory Clinical Findings    ECG personally reviewed by me today - *** - no acute changes.  VITALS Reviewed today   Temp Readings from Last 3 Encounters:  11/30/20 97.7 F (36.5 C)  09/14/20 99.2 F (37.3 C) (Oral)  09/05/20 98.6 F (37 C) (Oral)   BP Readings from Last 3 Encounters:  11/30/20 (!) 161/65  10/25/20 99/60  09/27/20 (!) 84/52   Pulse Readings from Last 3 Encounters:  11/30/20 75  10/25/20 98  09/27/20 (!) 150    Wt Readings from Last 3 Encounters:  11/30/20 260 lb (117.9 kg)  09/27/20 280 lb (127 kg)  09/12/20 280 lb (127 kg)     LABS  reviewed today   Lab Results  Component Value Date   WBC 10.4 11/30/2020   HGB 8.9 (  L)  11/30/2020   HCT 29.0 (L) 11/30/2020   MCV 79.0 (L) 11/30/2020   PLT 191 11/30/2020   Lab Results  Component Value Date   CREATININE 4.27 (H) 11/30/2020   BUN 55 (H) 11/30/2020   NA 137 11/30/2020   K 3.8 11/30/2020   CL 100 11/30/2020   CO2 26 11/30/2020   Lab Results  Component Value Date   ALT 161 (H) 11/28/2020   AST 56 (H) 11/28/2020   ALKPHOS 79 11/28/2020   BILITOT 0.6 11/28/2020   Lab Results  Component Value Date   CHOL 112 09/14/2020   HDL 31 (L) 09/14/2020   LDLCALC 62 09/14/2020   TRIG 93 09/14/2020   CHOLHDL 3.6 09/14/2020    Lab Results  Component Value Date   HGBA1C 7.1 (H) 11/27/2020   Lab Results  Component Value Date   TSH 1.854 11/25/2020     STUDIES/PROCEDURES reviewed today   *** Echo 11/25/20 1. Left ventricular ejection fraction, by estimation, is 30 to 35%. The  left ventricle has moderately decreased function. The left ventricle  demonstrates global hypokinesis. Unable to exclude regional wall motion  abnormality, select images with  hypokinesis of the anterior and anteroseptal wall though not well  visualized. Left ventricular diastolic parameters are indeterminate.  2. Right ventricular systolic function is normal. The right ventricular  size is normal. There is mildly elevated pulmonary artery systolic  pressure. The estimated right ventricular systolic pressure is 24.2 mmHg.  3. Left atrial size was moderately dilated.  4. Right atrial size was moderately dilated.  5. Tricuspid valve regurgitation is moderate.  6. Mild mitral valve regurgitation.  7. A small pericardial effusion is present.  8. Rhythm is atrial fibrillation with RVR  LE [place] Summary:  Bilateral:  - No evidence of deep vein thrombosis seen in the lower extremities,  bilaterally, from the common femoral through the popliteal veins.  Right:  - There is no evidence of venous reflux seen in the right lower extremity.  - No evidence of superficial venous  reflux seen in the right greater  saphenous vein.  Left:  - Venous reflux is noted in the left common femoral vein.  - Venous reflux is noted in the left greater saphenous vein in the thigh.    Carotid US 09/13/20 Right: Color duplex indicates moderate heterogeneous and calcified plaque, with no hemodynamically significant stenosis by duplex criteria in the extracranial cerebrovascular circulation. Left: Heterogeneous and partially calcified plaque at the left carotid bifurcation, with discordant results regarding degree of stenosis by established duplex criteria. Peak velocity suggests 50%-69% stenosis, with the ICA/ CCA ratio suggesting a lesser degree of stenosis. If establishing a more accurate degree of stenosis is required, cerebral angiogram should be considered, or as a second best test, CTA.  ABI 08/2020 Summary:  Right: Resting right ankle-brachial index indicates noncompressible right  lower extremity arteries.  Strong biphasic anterior tibial artery waveform with evidence of posterior  tibial level occlusive disease. Abnormal toe-brachial index.  Left: Resting left ankle-brachial index indicates noncompressible left  lower extremity arteries.  Strong biphasic anterior tibial artery waveform with known posterior  tibial and peroneal artery occlusive disease. Abnormal toe-brachial index.   Assessment & Plan    ***  Medication changes: *** Labs ordered: *** Studies / Imaging ordered: *** Future considerations: *** Disposition: ***  Total time spent with patient today *** minutes. This includes reviewing records, evaluating the patient, and coordinating care. Face-to-face time >50%.    Malachi Bonds D  Mickle Plumb, PA-C 11/30/2020

## 2020-11-30 NOTE — Progress Notes (Signed)
[facility]  ROUNDING NOTE   Subjective:   Seen earlier today.  Plan for left above-the-knee amputation Serum creatinine today lower at 4.27 1000 cc of fluid was removed with dialysis yesterday   Objective:  Vital signs in last 24 hours:  Temp:  [98.4 F (36.9 C)-99.5 F (37.5 C)] 98.4 F (36.9 C) (12/02 0753) Pulse Rate:  [68-82] 75 (12/02 0753) Resp:  [8-23] 16 (12/02 0753) BP: (106-157)/(35-88) 135/59 (12/02 0753) SpO2:  [94 %-99 %] 96 % (12/02 0753) Weight:  [117.9 kg] 117.9 kg (12/02 0309)  Weight change: -4.99 kg Filed Weights   11/28/20 0435 11/29/20 0327 11/30/20 0309  Weight: 132 kg 122.9 kg 117.9 kg    Intake/Output: I/O last 3 completed shifts: In: 240 [P.O.:240] Out: 500 [Other:500]   Intake/Output this shift:  No intake/output data recorded.  Physical Exam: General:  Lying in bed, receiving dialysis treatment  Head:  Normocephalic, atraumatic  Eyes:  Sclerae and conjunctivae clear  Lungs:    lungs diminished at the bases  Heart:  S1S2, HR in 70s  Abdomen:  Soft, nontender, obese  Extremities: 1+ peripheral edema. Left lower extremity with dressing clean dry and intact  Neurologic:  Oriented x 3, speech clear and appropriate  Skin: No acute lesions or rashes, left lower leg with dressing Right leg swollen and erythematous   Access Lt IJ Temporary catheter    Basic Metabolic Panel: Recent Labs  Lab 11/25/20 0723 11/25/20 0723 11/26/20 0502 11/26/20 0502 11/27/20 0629 11/28/20 0559 11/30/20 0605  NA 140  --  140  --  137 136 137  K 4.2  --  4.1  --  4.0 3.9 3.8  CL 108  --  105  --  101 98 100  CO2 19*  --  19*  --  21* 23 26  GLUCOSE 89  --  165*  --  198* 125* 92  BUN 79*  --  80*  --  81* 83* 55*  CREATININE 4.88*  --  5.08*  --  5.40* 5.61* 4.27*  CALCIUM 7.9*   < > 7.3*   < > 7.4* 7.4* 7.5*  MG 1.8  --   --   --   --   --  1.5*   < > = values in this interval not displayed.    Liver Function Tests: Recent Labs  Lab  11/24/20 0934 11/26/20 0502 11/27/20 0629 11/28/20 0559  AST 463* 160* 89* 56*  ALT 401* 273* 209* 161*  ALKPHOS 105 89 85 79  BILITOT 1.3* 0.7 1.0 0.6  PROT 6.3* 5.6* 5.4* 5.5*  ALBUMIN 2.8* 2.4* 2.3* 2.1*   No results for input(s): LIPASE, AMYLASE in the last 168 hours. No results for input(s): AMMONIA in the last 168 hours.  CBC: Recent Labs  Lab 11/25/20 0723 11/26/20 0502 11/27/20 0629 11/28/20 0559 11/30/20 0605  WBC 9.0 10.8* 12.5* 13.8* 10.4  NEUTROABS  --  8.6* 10.3* 10.8*  --   HGB 9.2* 9.5* 9.0* 8.6* 8.9*  HCT 30.8* 31.2* 29.7* 27.8* 29.0*  MCV 83.5 82.5 81.8 79.9* 79.0*  PLT 230 217 195 196 191    Cardiac Enzymes: No results for input(s): CKTOTAL, CKMB, CKMBINDEX, TROPONINI in the last 168 hours.  BNP: Invalid input(s): POCBNP  CBG: Recent Labs  Lab 11/29/20 0756 11/29/20 1300 11/29/20 1624 11/29/20 2023 11/30/20 0754  GLUCAP 86 115* 135* 116* 57    Microbiology: Results for orders placed or performed during the hospital encounter of 11/24/20  Resp Panel by RT-PCR (Flu A&B, Covid) Nasopharyngeal Swab     Status: None   Collection Time: 11/24/20  9:35 AM   Specimen: Nasopharyngeal Swab; Nasopharyngeal(NP) swabs in vial transport medium  Result Value Ref Range Status   SARS Coronavirus 2 by RT PCR NEGATIVE NEGATIVE Final    Comment: (NOTE) SARS-CoV-2 target nucleic acids are NOT DETECTED.  The SARS-CoV-2 RNA is generally detectable in upper respiratory specimens during the acute phase of infection. The lowest concentration of SARS-CoV-2 viral copies this assay can detect is 138 copies/mL. A negative result does not preclude SARS-Cov-2 infection and should not be used as the sole basis for treatment or other patient management decisions. A negative result may occur with  improper specimen collection/handling, submission of specimen other than nasopharyngeal swab, presence of viral mutation(s) within the areas targeted by this assay, and  inadequate number of viral copies(<138 copies/mL). A negative result must be combined with clinical observations, patient history, and epidemiological information. The expected result is Negative.  Fact Sheet for Patients:  EntrepreneurPulse.com.au  Fact Sheet for Healthcare Providers:  IncredibleEmployment.be  This test is no t yet approved or cleared by the [place] FDA and  has been authorized for detection and/or diagnosis of SARS-CoV-2 by FDA under an Emergency Use Authorization (EUA). This EUA will remain  in effect (meaning this test can be used) for the duration of the COVID-19 declaration under Section 564(b)(1) of the Act, 21 U.S.C.section 360bbb-3(b)(1), unless the authorization is terminated  or revoked sooner.       Influenza A by PCR NEGATIVE NEGATIVE Final   Influenza B by PCR NEGATIVE NEGATIVE Final    Comment: (NOTE) The Xpert Xpress SARS-CoV-2/FLU/RSV plus assay is intended as an aid in the diagnosis of influenza from Nasopharyngeal swab specimens and should not be used as a sole basis for treatment. Nasal washings and aspirates are unacceptable for Xpert Xpress SARS-CoV-2/FLU/RSV testing.  Fact Sheet for Patients: EntrepreneurPulse.com.au  Fact Sheet for Healthcare Providers: IncredibleEmployment.be  This test is not yet approved or cleared by the [place] FDA and has been authorized for detection and/or diagnosis of SARS-CoV-2 by FDA under an Emergency Use Authorization (EUA). This EUA will remain in effect (meaning this test can be used) for the duration of the COVID-19 declaration under Section 564(b)(1) of the Act, 21 U.S.C. section 360bbb-3(b)(1), unless the authorization is terminated or revoked.  Performed at [facility], [address] [place], [place] [address]     Coagulation Studies: Recent Labs    11/30/20 0605  LABPROT 23.1*  INR 2.1*     Urinalysis: No results for input(s): COLORURINE, LABSPEC, PHURINE, GLUCOSEU, HGBUR, BILIRUBINUR, KETONESUR, PROTEINUR, UROBILINOGEN, NITRITE, LEUKOCYTESUR in the last 72 hours.  Invalid input(s): APPERANCEUR    Imaging: DG Knee 1-2 Views Right  Result Date: 11/29/2020 CLINICAL DATA:  Fall. EXAM: RIGHT KNEE - 1-2 VIEW COMPARISON:  04/30/2017 FINDINGS: Suboptimal study due to technique and positioning. Generalized osteopenia.  No fracture identified Tricompartmental degenerative change with joint space narrowing and spurring most severe in the patellofemoral joint. Chondrocalcinosis. Arterial calcification IMPRESSION: Negative for fracture.  Tricompartmental degenerative change. Electronically Signed   By: Franchot Gallo M.D.   On: 11/29/2020 10:28   PERIPHERAL VASCULAR CATHETERIZATION  Result Date: 11/28/2020 See Op Note  DG Chest Port 1 View  Result Date: 11/28/2020 CLINICAL DATA:  Central line placement. EXAM: PORTABLE CHEST 1 VIEW COMPARISON:  November 24, 2020. FINDINGS: Stable cardiomegaly. Interval placement of left internal jugular catheter with  distal tip in expected position of the right atrium. No pneumothorax is noted. Mild bibasilar subsegmental atelectasis is noted. Small right pleural effusion may be present. Bony thorax is unremarkable. IMPRESSION: Interval placement of left internal jugular catheter with distal tip in expected position of the right atrium. No pneumothorax is noted. Mild bibasilar subsegmental atelectasis is noted. Electronically Signed   By: Marijo Conception M.D.   On: 11/28/2020 14:55     Medications:   . sodium chloride 250 mL (11/29/20 1556)  .  ceFAZolin (ANCEF) IV 1 g (11/29/20 1559)  .  ceFAZolin (ANCEF) IV     . sodium chloride   Intravenous Once  . acetaminophen  650 mg Oral Once  . amiodarone  400 mg Oral BID  . Chlorhexidine Gluconate Cloth  6 each Topical Q0600  . Chlorhexidine Gluconate Cloth  6 each Topical Once  . collagenase    Topical Daily  . diphenhydrAMINE  25 mg Intravenous Once  . feeding supplement (NEPRO CARB STEADY)  237 mL Oral BID BM  . insulin aspart  0-6 Units Subcutaneous TID WC   sodium chloride, acetaminophen, HYDROcodone-acetaminophen, ondansetron (ZOFRAN) IV  Assessment/ Plan:  Ms. Brooke Hopkins is a 69 y.o. white female with hypertension, diabetes mellitus type II insulin dependent, peripheral arterial disease, CVA, lymphedema, chronic lower extremity wounds, bed bound who was admitted to [facility] on 11/24/2020 for New onset atrial fibrillation ([place]) [I48.91] Elevated troponin [R77.8] Atrial fibrillation with rapid ventricular response ([place]) [I48.91] Acute renal failure, unspecified acute renal failure type ([place]) [N17.9]  # Acute renal failure with metabolic acidosis on chronic kidney disease stage IIIA:  Baseline creatinine of 1.03, GFR of 55 on 09/13/2020  History of proteinuria, hematuria and glycosuria.  Acute renal failure secondary to poor PO intake, nausea, vomiting, acute cardiorenal syndrome and suspect ATN.    Patient received her first dialysis treatment on November 30 after establishing temporary catheter on her left IJ We will continue to assess daily Next hemodialysis planned for Friday  #Anemia with renal failure Lab Results  Component Value Date   HGB 8.9 (L) 11/30/2020  Will consider Epogen with dialysis treatments  #Diabetes mellitus type II with CKD   Lab Results  Component Value Date   HGBA1C 7.1 (H) 11/27/2020  Blood glucose readings within acceptable range Patient is on insulin aspart Left above-the-knee amputation done on December 2, for nonhealing left foot osteomyelitis, atherosclerosis and gangrene.  #Atrial fibrillation Maintaining sinus rhythm now Patient is on Amiodarone p.o.    LOS: 6 Christpoher Sievers 12/2/202110:16 AM

## 2020-11-30 NOTE — Anesthesia Procedure Notes (Signed)
Procedure Name: Intubation Date/Time: 11/30/2020 1:17 PM Performed by: Tollie Eth, CRNA Pre-anesthesia Checklist: Patient identified, Patient being monitored, Timeout performed, Emergency Drugs available and Suction available Patient Re-evaluated:Patient Re-evaluated prior to induction Oxygen Delivery Method: Circle system utilized Preoxygenation: Pre-oxygenation with 100% oxygen Induction Type: IV induction Ventilation: Mask ventilation without difficulty Laryngoscope Size: 3 and McGraph Grade View: Grade I Tube type: Oral Tube size: 7.0 mm Number of attempts: 1 Airway Equipment and Method: Stylet Placement Confirmation: ETT inserted through vocal cords under direct vision,  positive ETCO2 and breath sounds checked- equal and bilateral Secured at: 21 cm Tube secured with: Tape Dental Injury: Teeth and Oropharynx as per pre-operative assessment

## 2020-11-30 NOTE — Progress Notes (Signed)
Progress Note  Patient Name: Brooke Hopkins Date of Encounter: 11/30/2020  [facility] Cardiologist: Kate Sable, MD   Subjective   No chest pain, SOB, or racing HR.  Joined today by family / husband.  S/p L IJ by [facility].  Consenting for plasma in preparation for her L side above the knee amputation today with Dr. Lucky Cowboy of [facility].  Inpatient Medications    Scheduled Meds: . sodium chloride   Intravenous Once  . acetaminophen  650 mg Oral Once  . amiodarone  400 mg Oral BID  . Chlorhexidine Gluconate Cloth  6 each Topical Q0600  . Chlorhexidine Gluconate Cloth  6 each Topical Once  . collagenase   Topical Daily  . diphenhydrAMINE  25 mg Intravenous Once  . feeding supplement (NEPRO CARB STEADY)  237 mL Oral BID BM  . insulin aspart  0-6 Units Subcutaneous TID WC   Continuous Infusions: . sodium chloride 250 mL (11/29/20 1556)  .  ceFAZolin (ANCEF) IV 1 g (11/29/20 1559)  .  ceFAZolin (ANCEF) IV     PRN Meds: sodium chloride, acetaminophen, HYDROcodone-acetaminophen, ondansetron (ZOFRAN) IV   Vital Signs    Vitals:   11/29/20 1611 11/29/20 1933 11/30/20 0309 11/30/20 0753  BP: (!) 124/35 (!) 124/53 (!) 130/58 (!) 135/59  Pulse: 79 80 76 75  Resp: 18 17 17 16   Temp: 98.6 F (37 C) 99.5 F (37.5 C) 98.9 F (37.2 C) 98.4 F (36.9 C)  TempSrc: Oral Oral Oral Oral  SpO2: 99% 96% 94% 96%  Weight:   117.9 kg   Height:        Intake/Output Summary (Last 24 hours) at 11/30/2020 1017 Last data filed at 11/29/2020 1933 Gross per 24 hour  Intake --  Output 500 ml  Net -500 ml   Last 3 Weights 11/30/2020 11/29/2020 11/28/2020  Weight (lbs) 260 lb 271 lb 291 lb  Weight (kg) 117.935 kg 122.925 kg 131.997 kg  Some encounter information is confidential and restricted. Go to Review Flowsheets activity to see all data.      Telemetry    NSR, PACs - Personally Reviewed  ECG    No new tracings- Personally Reviewed  Physical Exam   GEN: No acute distress.    Neck: Not able to assess JVD due to body habitus and position of the patient.  L neck dressing clean, dry, and intact. Cardiac: RRR with infrequent extrasystole, 1/6 systolic murmur. No rubs or gallops.  Respiratory: Bilateral distant breath sounds, anterior auscultation only. GI: Soft, nontender, non-distended  MS: 1-2+ RLEE, LLE wrapped with 1+ edema noted above dressing at level of knee  Neuro:  Nonfocal  Psych: Depressed affect   Labs    High Sensitivity Troponin:   Recent Labs  Lab 11/24/20 0934 11/24/20 1128 11/24/20 1338 11/24/20 1528  TROPONINIHS 194* 190* 205* 212*      Chemistry Recent Labs  Lab 11/26/20 0502 11/26/20 0502 11/27/20 0629 11/28/20 0559 11/30/20 0605  NA 140   < > 137 136 137  K 4.1   < > 4.0 3.9 3.8  CL 105   < > 101 98 100  CO2 19*   < > 21* 23 26  GLUCOSE 165*   < > 198* 125* 92  BUN 80*   < > 81* 83* 55*  CREATININE 5.08*   < > 5.40* 5.61* 4.27*  CALCIUM 7.3*   < > 7.4* 7.4* 7.5*  PROT 5.6*  --  5.4* 5.5*  --  ALBUMIN 2.4*  --  2.3* 2.1*  --   AST 160*  --  89* 56*  --   ALT 273*  --  209* 161*  --   ALKPHOS 89  --  85 79  --   BILITOT 0.7  --  1.0 0.6  --   GFRNONAA 9*   < > 8* 8* 11*  ANIONGAP 16*   < > 15 15 11    < > = values in this interval not displayed.     Hematology Recent Labs  Lab 11/27/20 0629 11/28/20 0559 11/30/20 0605  WBC 12.5* 13.8* 10.4  RBC 3.63* 3.48* 3.67*  HGB 9.0* 8.6* 8.9*  HCT 29.7* 27.8* 29.0*  MCV 81.8 79.9* 79.0*  MCH 24.8* 24.7* 24.3*  MCHC 30.3 30.9 30.7  RDW 16.9* 16.8* 17.0*  PLT 195 196 191    BNPNo results for input(s): BNP, PROBNP in the last 168 hours.   DDimer No results for input(s): DDIMER in the last 168 hours.   Radiology    DG Knee 1-2 Views Right  Result Date: 11/29/2020 CLINICAL DATA:  Fall. EXAM: RIGHT KNEE - 1-2 VIEW COMPARISON:  04/30/2017 FINDINGS: Suboptimal study due to technique and positioning. Generalized osteopenia.  No fracture identified Tricompartmental  degenerative change with joint space narrowing and spurring most severe in the patellofemoral joint. Chondrocalcinosis. Arterial calcification IMPRESSION: Negative for fracture.  Tricompartmental degenerative change. Electronically Signed   By: Franchot Gallo M.D.   On: 11/29/2020 10:28   PERIPHERAL VASCULAR CATHETERIZATION  Result Date: 11/28/2020 See Op Note  DG Chest Port 1 View  Result Date: 11/28/2020 CLINICAL DATA:  Central line placement. EXAM: PORTABLE CHEST 1 VIEW COMPARISON:  November 24, 2020. FINDINGS: Stable cardiomegaly. Interval placement of left internal jugular catheter with distal tip in expected position of the right atrium. No pneumothorax is noted. Mild bibasilar subsegmental atelectasis is noted. Small right pleural effusion may be present. Bony thorax is unremarkable. IMPRESSION: Interval placement of left internal jugular catheter with distal tip in expected position of the right atrium. No pneumothorax is noted. Mild bibasilar subsegmental atelectasis is noted. Electronically Signed   By: Marijo Conception M.D.   On: 11/28/2020 14:55    Cardiac Studies   Echo  11/25/20 1. Left ventricular ejection fraction, by estimation, is 30 to 35%. The  left ventricle has moderately decreased function. The left ventricle  demonstrates global hypokinesis. Unable to exclude regional wall motion  abnormality, select images with  hypokinesis of the anterior and anteroseptal wall though not well  visualized. Left ventricular diastolic parameters are indeterminate.  2. Right ventricular systolic function is normal. The right ventricular  size is normal. There is mildly elevated pulmonary artery systolic  pressure. The estimated right ventricular systolic pressure is 01.0 mmHg.  3. Left atrial size was moderately dilated.  4. Right atrial size was moderately dilated.  5. Tricuspid valve regurgitation is moderate.  6. Mild mitral valve regurgitation.  7. A small pericardial  effusion is present.  8. Rhythm is atrial fibrillation with RVR   Patient Profile     69 y.o. female with history of hypertension, DM2, CVA, PAD s/p left popliteal and AT stenting, obesity, CKD 3, chronic lymphedema, decubitus ulcers, and who is being seen today after admission 11/26 with hyperglycemia, atrial fibrillation with RVR, and hypotension requiring emergent DCCV in the emergency room with restoration of NSR but converting back to atrial fibrillation 11/27 at 9 AM with second DCCV 11/29 and maintaining NSR on amiodarone.  Assessment & Plan    Atrial fibrillation with RVR --Denies any racing heart rate or palpitations.  S/p DCCV 11/27/2020 and maintaining NSR on amiodarone.  As previously noted, she is at high risk for recurrent atrial fibrillation.   --Continue po amiodarone 400 mg twice daily for 1 week followed by 200 mg once daily thereafter.  Monitor LFTs on amiodarone given transaminitis.  --CHA2DS2VASc score of at least 7. Anticoagulation with Eliquis is currently on hold in preparation for her upcoming above-the-knee left side amputation today per the [facility].  --Restart of anticoagulation once felt safe to do so per [facility] after her procedure/surgery. Daily CBC.  Elevated troponin --No chest pain. HS Tn elevated and suspected to be supply demand ischemia in the setting of atrial fibrillation with rapid ventricular rate. Updated echo, however, as above with reduced EF from previous and will need further workup once pt has recovered from her amputation.  --No ASA given Eliquis and Plavix, which are currently held due to upcoming L sided amputation as above with restart as soon as felt safe to do so by [facility].   --Statin on held due to elevated LFTs and can be restarted once LFTs wnl. --No plan for ischemic work-up at this time. Recommend further workup of reduced EF once pt is able to be back on antiplatelet therapy and recovered from her surgery.  Acute renal failure --Suspected likely  2/2 ATN component.  Followed by nephrology.  Left sided IJ catheter placed for temporary HD.  History of strokes -Plavix and Eliquis currently on hold 2/2 upcoming amputation.  As above, recommend restart once felt safe to do so per [facility]. Previously on Lipitor, held due to elevated LFTs.   PAD -Prior intervention of lower extremities, including left popliteal and AT stenting.  Followed by vascular surgery.  Upcoming left lower extremity amputation today as above. Eliquis and Plavix currently on hold 2/2 upcoming amputation.  As above, recommend restart once felt safe to do so per [facility].   Statin held due to LFTs.    For questions or updates, please contact [place] Please consult [email or web address] for contact info under        Signed, Arvil Chaco, PA-C  11/30/2020, 10:17 AM

## 2020-11-30 NOTE — Interval H&P Note (Signed)
History and Physical Interval Note:  11/30/2020 12:32 PM  Brooke Hopkins  has presented today for surgery, with the diagnosis of Atherosclerotic disease with gangrene.  The various methods of treatment have been discussed with the patient and family. After consideration of risks, benefits and other options for treatment, the patient has consented to  Procedure(s): AMPUTATION ABOVE KNEE (Left) as a surgical intervention.  The patient's history has been reviewed, patient examined, no change in status, stable for surgery.  I have reviewed the patient's chart and labs.  Questions were answered to the patient's satisfaction.     Leotis Pain

## 2020-11-30 NOTE — Progress Notes (Signed)
CBG 100 

## 2020-12-01 ENCOUNTER — Encounter: Payer: Self-pay | Admitting: Vascular Surgery

## 2020-12-01 DIAGNOSIS — M86272 Subacute osteomyelitis, left ankle and foot: Secondary | ICD-10-CM | POA: Diagnosis not present

## 2020-12-01 DIAGNOSIS — K7469 Other cirrhosis of liver: Secondary | ICD-10-CM | POA: Diagnosis not present

## 2020-12-01 DIAGNOSIS — I739 Peripheral vascular disease, unspecified: Secondary | ICD-10-CM | POA: Diagnosis not present

## 2020-12-01 DIAGNOSIS — N179 Acute kidney failure, unspecified: Secondary | ICD-10-CM | POA: Diagnosis not present

## 2020-12-01 DIAGNOSIS — I4891 Unspecified atrial fibrillation: Secondary | ICD-10-CM | POA: Diagnosis not present

## 2020-12-01 DIAGNOSIS — D6869 Other thrombophilia: Secondary | ICD-10-CM | POA: Diagnosis not present

## 2020-12-01 DIAGNOSIS — N1831 Chronic kidney disease, stage 3a: Secondary | ICD-10-CM

## 2020-12-01 LAB — MAGNESIUM: Magnesium: 1.9 mg/dL (ref 1.7–2.4)

## 2020-12-01 LAB — CBC
HCT: 28.4 % — ABNORMAL LOW (ref 36.0–46.0)
HCT: 30.3 % — ABNORMAL LOW (ref 36.0–46.0)
Hemoglobin: 8.8 g/dL — ABNORMAL LOW (ref 12.0–15.0)
Hemoglobin: 9.5 g/dL — ABNORMAL LOW (ref 12.0–15.0)
MCH: 24.6 pg — ABNORMAL LOW (ref 26.0–34.0)
MCH: 24.9 pg — ABNORMAL LOW (ref 26.0–34.0)
MCHC: 31 g/dL (ref 30.0–36.0)
MCHC: 31.4 g/dL (ref 30.0–36.0)
MCV: 79.5 fL — ABNORMAL LOW (ref 80.0–100.0)
MCV: 79.6 fL — ABNORMAL LOW (ref 80.0–100.0)
Platelets: 202 10*3/uL (ref 150–400)
Platelets: 203 10*3/uL (ref 150–400)
RBC: 3.57 MIL/uL — ABNORMAL LOW (ref 3.87–5.11)
RBC: 3.81 MIL/uL — ABNORMAL LOW (ref 3.87–5.11)
RDW: 17 % — ABNORMAL HIGH (ref 11.5–15.5)
RDW: 17 % — ABNORMAL HIGH (ref 11.5–15.5)
WBC: 11.5 10*3/uL — ABNORMAL HIGH (ref 4.0–10.5)
WBC: 9.3 10*3/uL (ref 4.0–10.5)
nRBC: 0 % (ref 0.0–0.2)
nRBC: 0 % (ref 0.0–0.2)

## 2020-12-01 LAB — PREPARE FRESH FROZEN PLASMA
Unit division: 0
Unit division: 0

## 2020-12-01 LAB — BASIC METABOLIC PANEL
Anion gap: 12 (ref 5–15)
BUN: 56 mg/dL — ABNORMAL HIGH (ref 8–23)
CO2: 24 mmol/L (ref 22–32)
Calcium: 8 mg/dL — ABNORMAL LOW (ref 8.9–10.3)
Chloride: 101 mmol/L (ref 98–111)
Creatinine, Ser: 4.42 mg/dL — ABNORMAL HIGH (ref 0.44–1.00)
GFR, Estimated: 10 mL/min — ABNORMAL LOW (ref >60)
Glucose, Bld: 136 mg/dL — ABNORMAL HIGH (ref 70–99)
Potassium: 4.2 mmol/L (ref 3.5–5.1)
Sodium: 137 mmol/L (ref 135–145)

## 2020-12-01 LAB — BPAM FFP
Blood Product Expiration Date: 202112072359
Blood Product Expiration Date: 202112072359
ISSUE DATE / TIME: 202112021034
ISSUE DATE / TIME: 202112021156
Unit Type and Rh: 1700
Unit Type and Rh: 7300

## 2020-12-01 LAB — GLUCOSE, CAPILLARY
Glucose-Capillary: 193 mg/dL — ABNORMAL HIGH (ref 70–99)
Glucose-Capillary: 253 mg/dL — ABNORMAL HIGH (ref 70–99)
Glucose-Capillary: 180 mg/dL — ABNORMAL HIGH (ref 70–99)

## 2020-12-01 MED ORDER — AMIODARONE LOAD VIA INFUSION
150.0000 mg | Freq: Once | INTRAVENOUS | Status: AC
  Administered 2020-12-01: 150 mg via INTRAVENOUS
  Filled 2020-12-01: qty 83.34

## 2020-12-01 MED ORDER — METOPROLOL TARTRATE 25 MG PO TABS
25.0000 mg | ORAL_TABLET | Freq: Two times a day (BID) | ORAL | Status: DC
  Administered 2020-12-01: 25 mg via ORAL
  Filled 2020-12-01: qty 1

## 2020-12-01 MED ORDER — AMIODARONE HCL IN DEXTROSE 360-4.14 MG/200ML-% IV SOLN
60.0000 mg/h | INTRAVENOUS | Status: DC
  Administered 2020-12-01: 30 mg/h via INTRAVENOUS
  Administered 2020-12-02 – 2020-12-05 (×11): 60 mg/h via INTRAVENOUS
  Filled 2020-12-01 (×15): qty 200

## 2020-12-01 MED ORDER — ALBUMIN HUMAN 25 % IV SOLN
25.0000 g | Freq: Once | INTRAVENOUS | Status: AC
  Administered 2020-12-01: 25 g via INTRAVENOUS
  Filled 2020-12-01 (×2): qty 100

## 2020-12-01 MED ORDER — CLOPIDOGREL BISULFATE 75 MG PO TABS
75.0000 mg | ORAL_TABLET | Freq: Every day | ORAL | Status: DC
  Administered 2020-12-01 – 2020-12-07 (×6): 75 mg via ORAL
  Filled 2020-12-01 (×7): qty 1

## 2020-12-01 MED ORDER — METOPROLOL TARTRATE 5 MG/5ML IV SOLN
5.0000 mg | Freq: Once | INTRAVENOUS | Status: AC
  Administered 2020-12-01: 5 mg via INTRAVENOUS
  Filled 2020-12-01: qty 5

## 2020-12-01 MED ORDER — AMIODARONE HCL IN DEXTROSE 360-4.14 MG/200ML-% IV SOLN
60.0000 mg/h | INTRAVENOUS | Status: AC
  Administered 2020-12-01 (×2): 60 mg/h via INTRAVENOUS
  Filled 2020-12-01 (×2): qty 200

## 2020-12-01 MED ORDER — METOPROLOL TARTRATE 5 MG/5ML IV SOLN
5.0000 mg | INTRAVENOUS | Status: AC
  Administered 2020-12-01: 5 mg via INTRAVENOUS
  Filled 2020-12-01: qty 5

## 2020-12-01 MED ORDER — APIXABAN 5 MG PO TABS
5.0000 mg | ORAL_TABLET | Freq: Two times a day (BID) | ORAL | Status: DC
  Administered 2020-12-01 – 2020-12-07 (×12): 5 mg via ORAL
  Filled 2020-12-01 (×13): qty 1

## 2020-12-01 NOTE — Progress Notes (Signed)
   11/30/20 1027 11/30/20 1108 11/30/20 1136  Vitals  Temp 98.4 F (36.9 C) 98.6 F (37 C) 98.2 F (36.8 C)  Temp Source Oral Oral Oral  BP (!) 151/60 (!) 149/64 (!) 144/73  MAP (mmHg) 88 89  --   BP Location Right Wrist Right Wrist  --   BP Method Automatic Automatic  --   Patient Position (if appropriate) Lying Lying  --   Pulse Rate 79 79 80  Pulse Rate Source  --   --   --   ECG Heart Rate  --   --   --   Resp 16 16 14     11/30/20 1159 11/30/20 1215 11/30/20 1256  Vitals  Temp 97.8 F (36.6 C) 98.7 F (37.1 C) 98.4 F (36.9 C)  Temp Source Oral Oral Oral  BP (!) 145/57 (!) 132/59 138/69  MAP (mmHg)  --   --   --   BP Location  --   --   --   BP Method  --   --   --   Patient Position (if appropriate)  --   --   --   Pulse Rate 79 75 78  Pulse Rate Source  --   --   --   ECG Heart Rate  --   --   --   Resp 14 14 14     11/30/20 1453 11/30/20 1509  Vitals  Temp 97.7 F (36.5 C)  --   Temp Source  --   --   BP (!) 143/75  --   MAP (mmHg) 94  --   BP Location Right Arm  --   BP Method Automatic  --   Patient Position (if appropriate) Lying  --   Pulse Rate 79 77  Pulse Rate Source Monitor  --   ECG Heart Rate 76 81  Resp (!) 23 (!) 22

## 2020-12-01 NOTE — Progress Notes (Signed)
   11/30/20 1514 11/30/20 1519 11/30/20 1520  Vitals  BP  --   --  (!) 161/65  MAP (mmHg)  --   --  85  Pulse Rate 77 75 75  ECG Heart Rate 80 78 80  Resp 19 16 17

## 2020-12-01 NOTE — Progress Notes (Signed)
[facility] Daily Progress Note   Subjective: 11/28/20: Insertion of temporary dialysis catheter catheter left IJ approach.  11/30/20:  Left above-the-knee amputation  Patient without complaint this AM.  Pain seems to be controlled.  Objective: Vitals:   12/01/20 0002 12/01/20 0227 12/01/20 0415 12/01/20 0821  BP: (!) 156/86 (!) 159/93 113/74 117/61  Pulse: (!) 134 (!) 144 (!) 145 (!) 117  Resp: 17 18 18    Temp: 97.9 F (36.6 C) 98.8 F (37.1 C) 98 F (36.7 C) 98.4 F (36.9 C)  TempSrc: Oral Oral Oral   SpO2: 100% 100% 100% 100%  Weight:   127.5 kg   Height:        Intake/Output Summary (Last 24 hours) at 12/01/2020 1055 Last data filed at 11/30/2020 1830 Gross per 24 hour  Intake 1426.5 ml  Output 1050 ml  Net 376.5 ml   Physical Exam: A&Ox3, NAD CV: Irregularly irregular Pulmonary: CTA Bilaterally Abdomen: Soft, Nontender, Nondistended Vascular:  Left lower extremity: Thigh soft.  Or dressing is intact clean and dry.  Prevena VAC is intact and to suction.  Watery output noted in container.   Laboratory: CBC    Component Value Date/Time   WBC 9.3 12/01/2020 0520   HGB 8.8 (L) 12/01/2020 0520   HCT 28.4 (L) 12/01/2020 0520   PLT 203 12/01/2020 0520   BMET    Component Value Date/Time   NA 137 11/30/2020 2350   K 4.2 11/30/2020 2350   CL 101 11/30/2020 2350   CO2 24 11/30/2020 2350   GLUCOSE 136 (H) 11/30/2020 2350   BUN 56 (H) 11/30/2020 2350   CREATININE 4.42 (H) 11/30/2020 2350   CALCIUM 8.0 (L) 11/30/2020 2350   GFRNONAA 10 (L) 11/30/2020 2350   GFRAA >60 09/13/2020 0539   Assessment/Planning: The patient is a 69 year old female with multiple health issues including known peripheral artery disease with chronic wounds to left lower extremity status post nonsalvageable foot as per podiatry status post left above-the-knee amputation - POD#1  Will restart the patient's Eliquis and Plavix today (AF).  Less than a gram drop in  hemoglobin s/p surgery.  Output in VAC mostly watery as expected due to the massive amount of fluid in the leg.  We will plan on removing dressing Sunday (POD#3).  AM. labs ordered to H&H.  Would maximize nutrition for healing.  From vascular standpoint can be discharged after OR dressing is removed on Sunday.  Discussed with Dr. Ellis Parents Darin Redmann PA-C 12/01/2020 10:55 AM

## 2020-12-01 NOTE — Progress Notes (Signed)
   11/29/20 1215 11/29/20 1230  Vitals  BP (!) 141/60 (!) 157/63  MAP (mmHg) 82 89  Pulse Rate 81 82  ECG Heart Rate 82 83  Resp 19 20  During Hemodialysis Assessment  Blood Flow Rate (mL/min) 250 mL/min 100 mL/min  Arterial Pressure (mmHg) -80 mmHg -40 mmHg  Venous Pressure (mmHg) 100 mmHg 50 mmHg  Transmembrane Pressure (mmHg) 30 mmHg 10 mmHg  Ultrafiltration Rate (mL/min) 500 mL/min 70 mL/min  Dialysate Flow Rate (mL/min) 300 ml/min 300 ml/min  Conductivity: Machine  14 14  HD Safety Checks Performed Yes Yes  Dialysis Fluid Bolus  --  Normal Saline  Bolus Amount (mL)  --  250 mL  Intra-Hemodialysis Comments Progressing as prescribed Tx completed  Post-Hemodialysis Assessment  Rinseback Volume (mL)  --  250 mL  KECN  --  29 V  Dialyzer Clearance  --  Lightly streaked  Duration of HD Treatment -hour(s)  --  2 hour(s)  Hemodialysis Intake (mL)  --  500 mL  UF Total -Machine (mL)  --  1000 mL  Net UF (mL)  --  500 mL  Tolerated HD Treatment  --  Yes

## 2020-12-01 NOTE — Consult Note (Signed)
Pharmacy Antibiotic Note  Brooke Hopkins is a 69 y.o. 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  Pharmacy has been consulted for Ancef dosing.  Plan: Day 3 of abx. S/p amputation 12/2.  Continue ancef 1g q24H. Pt received a total of 2 g on 12/2. Plan for HD on 12/3. S/p insertion of temporary dialysis catheter. F/u with HD plan and renal adjustments if needed. F/u with duration of abx.   Height: 5\' 7"  (170.2 cm) Weight: 127.5 kg (281 lb) (specialty bed) IBW/kg (Calculated) : 61.6  Temp (24hrs), Avg:98.2 F (36.8 C), Min:97.5 F (36.4 C), Max:98.8 F (37.1 C)  Recent Labs  Lab 11/26/20 0502 11/26/20 0502 11/27/20 0629 11/28/20 0559 11/30/20 0605 11/30/20 2350 12/01/20 0520  WBC 10.8*   < > 12.5* 13.8* 10.4 11.5* 9.3  CREATININE 5.08*  --  5.40* 5.61* 4.27* 4.42*  --    < > = values in this interval not displayed.    Estimated Creatinine Clearance: 16.2 mL/min (A) (by C-G formula based on SCr of 4.42 mg/dL (H)).    No Known Allergies  Antimicrobials this admission: 12/1 ancef >>   Dose adjustments this admission: None  Microbiology results: None   Thank you for allowing pharmacy to be a part of this patient's care.  Oswald Hillock, PharmD, BCPS 12/01/2020 9:44 AM

## 2020-12-01 NOTE — Progress Notes (Addendum)
Dialysis RN called to report pt HR in 140's, requesting medications. Called cardiology, per Dr.Lambert, orders for  lopressor 5mg  IV push now, once. As long as BP stable, and HR slightly elevated, ok to continue dialysis treatment. Will continue to monitor.

## 2020-12-01 NOTE — Progress Notes (Signed)
Progress Note  Patient Name: Brooke Hopkins Date of Encounter: 12/01/2020  Primary Cardiologist: Garen Lah  Subjective   Status post left AKA on 12/2. She redeveloped Afib with RVR on 12/2 at 19:15 with ventricular rates in the 120s to 140s bpm. No chest pain or dyspnea this morning. Potassium 4.2, magnesium 1.9. Post operative HGB 8.8.   Inpatient Medications    Scheduled Meds: . Chlorhexidine Gluconate Cloth  6 each Topical Q0600  . Chlorhexidine Gluconate Cloth  6 each Topical Once  . collagenase   Topical Daily  . feeding supplement (NEPRO CARB STEADY)  237 mL Oral BID BM  . insulin aspart  0-6 Units Subcutaneous TID WC   Continuous Infusions: . sodium chloride 250 mL (11/30/20 2018)  . amiodarone 60 mg/hr (12/01/20 0844)   Followed by  . amiodarone    .  ceFAZolin (ANCEF) IV 1 g (11/30/20 2137)   PRN Meds: sodium chloride, acetaminophen, HYDROcodone-acetaminophen, ondansetron (ZOFRAN) IV   Vital Signs    Vitals:   12/01/20 0002 12/01/20 0227 12/01/20 0415 12/01/20 0821  BP: (!) 156/86 (!) 159/93 113/74 117/61  Pulse: (!) 134 (!) 144 (!) 145 (!) 117  Resp: 17 18 18    Temp: 97.9 F (36.6 C) 98.8 F (37.1 C) 98 F (36.7 C) 98.4 F (36.9 C)  TempSrc: Oral Oral Oral   SpO2: 100% 100% 100% 100%  Weight:   127.5 kg   Height:        Intake/Output Summary (Last 24 hours) at 12/01/2020 0853 Last data filed at 11/30/2020 1830 Gross per 24 hour  Intake 1426.5 ml  Output 1050 ml  Net 376.5 ml   Filed Weights   11/29/20 0327 11/30/20 0309 12/01/20 0415  Weight: 122.9 kg 117.9 kg 127.5 kg    Telemetry    Afib with RVR redeveloped at 19:15 on 12/2 with ventricular rates in the 120s to 140s bpm - Personally Reviewed  ECG    Afib with RVR, 149 bpm, nonspecific st/t changes - Personally Reviewed  Physical Exam   GEN: No acute distress.   Neck: JVD difficult to assess secondary to body habitus. Cardiac: Tachycardic, IRIR, I/VI systolic murmur, no rubs,  or gallops.  Respiratory: Diminished breath sounds to anterior exam.  GI: Soft, nontender, non-distended.   MS: Status post left AKA. Neuro:  Alert and oriented x 3; Nonfocal.  Psych: Normal affect.  Labs    Chemistry Recent Labs  Lab 11/26/20 0502 11/26/20 0502 11/27/20 3500 11/27/20 0629 11/28/20 0559 11/30/20 0605 11/30/20 2350  NA 140   < > 137   < > 136 137 137  K 4.1   < > 4.0   < > 3.9 3.8 4.2  CL 105   < > 101   < > 98 100 101  CO2 19*   < > 21*   < > 23 26 24   GLUCOSE 165*   < > 198*   < > 125* 92 136*  BUN 80*   < > 81*   < > 83* 55* 56*  CREATININE 5.08*   < > 5.40*   < > 5.61* 4.27* 4.42*  CALCIUM 7.3*   < > 7.4*   < > 7.4* 7.5* 8.0*  PROT 5.6*  --  5.4*  --  5.5*  --   --   ALBUMIN 2.4*  --  2.3*  --  2.1*  --   --   AST 160*  --  89*  --  56*  --   --  ALT 273*  --  209*  --  161*  --   --   ALKPHOS 89  --  85  --  79  --   --   BILITOT 0.7  --  1.0  --  0.6  --   --   GFRNONAA 9*   < > 8*   < > 8* 11* 10*  ANIONGAP 16*   < > 15   < > 15 11 12    < > = values in this interval not displayed.     Hematology Recent Labs  Lab 11/30/20 0605 11/30/20 2350 12/01/20 0520  WBC 10.4 11.5* 9.3  RBC 3.67* 3.81* 3.57*  HGB 8.9* 9.5* 8.8*  HCT 29.0* 30.3* 28.4*  MCV 79.0* 79.5* 79.6*  MCH 24.3* 24.9* 24.6*  MCHC 30.7 31.4 31.0  RDW 17.0* 17.0* 17.0*  PLT 191 202 203    Cardiac EnzymesNo results for input(s): TROPONINI in the last 168 hours. No results for input(s): TROPIPOC in the last 168 hours.   BNPNo results for input(s): BNP, PROBNP in the last 168 hours.   DDimer No results for input(s): DDIMER in the last 168 hours.   Radiology    DG Knee 1-2 Views Right  Result Date: 11/29/2020 IMPRESSION: Negative for fracture.  Tricompartmental degenerative change. Electronically Signed   By: Franchot Gallo M.D.   On: 11/29/2020 10:28    Cardiac Studies   2D echo 11/25/2020: 1. Left ventricular ejection fraction, by estimation, is 30 to 35%. The  left  ventricle has moderately decreased function. The left ventricle  demonstrates global hypokinesis. Unable to exclude regional wall motion  abnormality, select images with  hypokinesis of the anterior and anteroseptal wall though not well  visualized. Left ventricular diastolic parameters are indeterminate.  2. Right ventricular systolic function is normal. The right ventricular  size is normal. There is mildly elevated pulmonary artery systolic  pressure. The estimated right ventricular systolic pressure is 13.2 mmHg.  3. Left atrial size was moderately dilated.  4. Right atrial size was moderately dilated.  5. Tricuspid valve regurgitation is moderate.  6. Mild mitral valve regurgitation.  7. A small pericardial effusion is present.  8. Rhythm is atrial fibrillation with RVR  Patient Profile     69 y.o. female with history of CVA, PAD s/p left popliteal and AT stenting, CKD stage III, chronic lymphedema, decubitus ulcers, DM2, HTN, and obesity who we are seeing of Afib with RVR and hypotension requiring emergent DCCV.  Assessment & Plan    1. Afib with RVR: -She presented on 11/26 and required emergent DCCV in the ED secondary to hypotension. She redeveloped Afib with RVR and was placed on amiodarone gtt with subsequent a repeat DCCV on 11/29 -Throughout this admission, medication options have been limited secondary to hypotension -She redeveloped Afib with RVR on 12/2 at 19:15 with ventricular rates in the 120s to 140s bpm -Add Lopressor 25 mg bid with hold parameters -Continue amiodarone gtt for loading -If she remains in Afib on 12/6, plan for repeat DCCV at that time -CHADS2VASc at least 7 -Not currently on anticoagulation given recent left AKA on 12/2 and anemia -Recommend starting heparin gtt when able   2. Elevated troponin: -She denies any chest pain -Mildly elevated and felt to be in the setting of Afib with RVR -Echo this admission with an EF of 35%, possibly  stress or tachy-mediated -Outpatient follow up  3. Cardiomyopathy: -Uncertain etiology -Cannot exclude stress vs tachy-mediated vs ischemic -  Denies active angina -Will need outpatient ischemic evaluation -Start Lopressor for now as above with consideration to transition to Toprol XL or Coreg if possible -Not currently on ACEi/ARB/Entresto/MRA secondary to AKI and hypotension   4. Osteomyelitis: -Status post left AKA 12/2 -Postoperative stress likely contributing to recurrence of Afib  5. Acute on CKD stage III: -Per IM  6. Anemia: -Likely mixed in etiology with chronic disease and ABL from surgery -Per IM  7. PAD: -Followed by [facility]  For questions or updates, please contact [place] Please consult [email or web address] for contact info under Cardiology/STEMI.    Signed, Christell Faith, PA-C [place] Pager: [phone] 12/01/2020, 8:53 AM

## 2020-12-01 NOTE — Progress Notes (Signed)
Per Dr.Gollan, cardio, continue to administer amio gtt at 60mg /hr at rate of 33.63ml/hr. HR 110-130s. Will continue to monitor.

## 2020-12-01 NOTE — Progress Notes (Signed)
[name], female   DOB: 1951/02/24, 69 y.o.   MRN: [id number] [facility] PROGRESS NOTE  Brooke Hopkins YIR:[id number] DOB: May 22, 1951 DOA: 11/24/2020 PCP: Lynnell Jude, MD  HPI/Subjective: Patient has very few complaints. This morning when I saw her she did not have any pain in her left leg. Still has pain in her right knee. Appetite not great. No complaints of chest pain or shortness of breath. Overnight went into rapid atrial fibrillation. Admitted initially with new onset atrial fibrillation and acute kidney injury.  Objective: Vitals:   12/01/20 0821 12/01/20 1137  BP: 117/61 98/65  Pulse: (!) 117 88  Resp:  16  Temp: 98.4 F (36.9 C) 98.2 F (36.8 C)  SpO2: 100% 100%    Intake/Output Summary (Last 24 hours) at 12/01/2020 1400 Last data filed at 11/30/2020 1830 Gross per 24 hour  Intake 790 ml  Output 1000 ml  Net -210 ml   Filed Weights   11/29/20 0327 11/30/20 0309 12/01/20 0415  Weight: 122.9 kg 117.9 kg 127.5 kg    ROS: Review of Systems  Respiratory: Negative for shortness of breath.   Cardiovascular: Negative for chest pain.  Gastrointestinal: Negative for abdominal pain, nausea and vomiting.  Musculoskeletal: Positive for joint pain.   Exam: Physical Exam HENT:     Head: Normocephalic.     Mouth/Throat:     Pharynx: No oropharyngeal exudate.  Eyes:     General: Lids are normal.     Conjunctiva/sclera: Conjunctivae normal.     Pupils: Pupils are equal, round, and reactive to light.  Cardiovascular:     Rate and Rhythm: Normal rate and regular rhythm.     Heart sounds: Normal heart sounds, S1 normal and S2 normal.  Pulmonary:     Breath sounds: Examination of the right-lower field reveals decreased breath sounds. Examination of the left-lower field reveals decreased breath sounds. Decreased breath sounds present. No wheezing, rhonchi or rales.  Abdominal:     Palpations: Abdomen is soft.     Tenderness: There is no  abdominal tenderness.  Musculoskeletal:     Left upper leg: Swelling present.     Right lower leg: No swelling.  Skin:    General: Skin is warm.     Comments: Left leg bandaged  Neurological:     Mental Status: She is alert.     Comments: Answers some yes or no questions peer       Data Reviewed: Basic Metabolic Panel: Recent Labs  Lab 11/25/20 0723 11/25/20 0723 11/26/20 0502 11/27/20 0629 11/28/20 0559 11/30/20 0605 11/30/20 2350 12/01/20 0520  NA 140   < > 140 137 136 137 137  --   K 4.2   < > 4.1 4.0 3.9 3.8 4.2  --   CL 108   < > 105 101 98 100 101  --   CO2 19*   < > 19* 21* 23 26 24   --   GLUCOSE 89   < > 165* 198* 125* 92 136*  --   BUN 79*   < > 80* 81* 83* 55* 56*  --   CREATININE 4.88*   < > 5.08* 5.40* 5.61* 4.27* 4.42*  --   CALCIUM 7.9*   < > 7.3* 7.4* 7.4* 7.5* 8.0*  --   MG 1.8  --   --   --   --  1.5*  --  1.9   < > = values in this interval not displayed.  Liver Function Tests: Recent Labs  Lab 11/26/20 0502 11/27/20 0629 11/28/20 0559  AST 160* 89* 56*  ALT 273* 209* 161*  ALKPHOS 89 85 79  BILITOT 0.7 1.0 0.6  PROT 5.6* 5.4* 5.5*  ALBUMIN 2.4* 2.3* 2.1*   CBC: Recent Labs  Lab 11/26/20 0502 11/26/20 0502 11/27/20 0629 11/28/20 0559 11/30/20 0605 11/30/20 2350 12/01/20 0520  WBC 10.8*   < > 12.5* 13.8* 10.4 11.5* 9.3  NEUTROABS 8.6*  --  10.3* 10.8*  --   --   --   HGB 9.5*   < > 9.0* 8.6* 8.9* 9.5* 8.8*  HCT 31.2*   < > 29.7* 27.8* 29.0* 30.3* 28.4*  MCV 82.5   < > 81.8 79.9* 79.0* 79.5* 79.6*  PLT 217   < > 195 196 191 202 203   < > = values in this interval not displayed.   BNP (last 3 results) Recent Labs    09/13/20 1046  BNP 233.8*    CBG: Recent Labs  Lab 11/30/20 1454 11/30/20 1659 11/30/20 2103 12/01/20 0823 12/01/20 1131  GLUCAP 100* 134* 130* 193* 253*    Recent Results (from the past 240 hour(s))  Resp Panel by RT-PCR (Flu A&B, Covid) Nasopharyngeal Swab     Status: None   Collection Time:  11/24/20  9:35 AM   Specimen: Nasopharyngeal Swab; Nasopharyngeal(NP) swabs in vial transport medium  Result Value Ref Range Status   SARS Coronavirus 2 by RT PCR NEGATIVE NEGATIVE Final    Comment: (NOTE) SARS-CoV-2 target nucleic acids are NOT DETECTED.  The SARS-CoV-2 RNA is generally detectable in upper respiratory specimens during the acute phase of infection. The lowest concentration of SARS-CoV-2 viral copies this assay can detect is 138 copies/mL. A negative result does not preclude SARS-Cov-2 infection and should not be used as the sole basis for treatment or other patient management decisions. A negative result may occur with  improper specimen collection/handling, submission of specimen other than nasopharyngeal swab, presence of viral mutation(s) within the areas targeted by this assay, and inadequate number of viral copies(<138 copies/mL). A negative result must be combined with clinical observations, patient history, and epidemiological information. The expected result is Negative.  Fact Sheet for Patients:  EntrepreneurPulse.com.au  Fact Sheet for Healthcare Providers:  IncredibleEmployment.be  This test is no t yet approved or cleared by the [place] FDA and  has been authorized for detection and/or diagnosis of SARS-CoV-2 by FDA under an Emergency Use Authorization (EUA). This EUA will remain  in effect (meaning this test can be used) for the duration of the COVID-19 declaration under Section 564(b)(1) of the Act, 21 U.S.C.section 360bbb-3(b)(1), unless the authorization is terminated  or revoked sooner.       Influenza A by PCR NEGATIVE NEGATIVE Final   Influenza B by PCR NEGATIVE NEGATIVE Final    Comment: (NOTE) The Xpert Xpress SARS-CoV-2/FLU/RSV plus assay is intended as an aid in the diagnosis of influenza from Nasopharyngeal swab specimens and should not be used as a sole basis for treatment. Nasal washings  and aspirates are unacceptable for Xpert Xpress SARS-CoV-2/FLU/RSV testing.  Fact Sheet for Patients: EntrepreneurPulse.com.au  Fact Sheet for Healthcare Providers: IncredibleEmployment.be  This test is not yet approved or cleared by the [place] FDA and has been authorized for detection and/or diagnosis of SARS-CoV-2 by FDA under an Emergency Use Authorization (EUA). This EUA will remain in effect (meaning this test can be used) for the duration of the COVID-19 declaration under Section  564(b)(1) of the Act, 21 U.S.C. section 360bbb-3(b)(1), unless the authorization is terminated or revoked.  Performed at [facility], [address] [place], [place] [address]       Scheduled Meds: . apixaban  5 mg Oral BID  . Chlorhexidine Gluconate Cloth  6 each Topical Q0600  . Chlorhexidine Gluconate Cloth  6 each Topical Once  . clopidogrel  75 mg Oral Daily  . collagenase   Topical Daily  . feeding supplement (NEPRO CARB STEADY)  237 mL Oral BID BM  . insulin aspart  0-6 Units Subcutaneous TID WC   Continuous Infusions: . sodium chloride 250 mL (11/30/20 2018)  . amiodarone    .  ceFAZolin (ANCEF) IV 1 g (11/30/20 2137)    Assessment/Plan:  1. Atrial fibrillation with rapid ventricular response. Atrial fibrillation is paroxysmal in nature. Patient back on Eliquis for anticoagulation. Patient needed to be placed back on IV amiodarone last night. Low-dose metoprolol added by cardiology. Case discussed with cardiology team and if not back in normal sinus rhythm they may cardiovert on Monday. 2. Left heel osteomyelitis with full-thickness ulceration with necrosis of muscle and leukocytosis. 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 Empiric Ancef would likely can get rid of antibiotics tomorrow. 3. Acute renal failure on chronic kidney disease stage IIIa. Creatinine Increasing on a Daily Basis to a Peak of 5.61 on 11/28/2020. Baseline Creatinine 1.03  (creatinine increase greater than 1.5 times from baseline within 7 days). Patient will have third dialysis session today and will need a PermCath and outpatient dialysis slot. 4. Acquired thrombophilia secondary to atrial fibrillation. Eliquis to reduce stroke risk. 5. Stage III decubitus ulcer on buttock, present on admission. See description below 6. Cirrhosis of the liver seen on previous imaging study with elevated liver function test. Continue to monitor closely while on amiodarone and anticoagulation. 7. Bedbound status 8. Right knee pain showing degenerative changes 9. Morbid obesity. Not sure if the weights are accurate or not.  Pressure Injury 11/26/20 Buttocks Left Stage 3 -  Full thickness tissue loss. Subcutaneous fat may be visible but bone, tendon or muscle are NOT exposed. (Active)  11/26/20 1313  Location: Buttocks  Location Orientation: Left  Staging: Stage 3 -  Full thickness tissue loss. Subcutaneous fat may be visible but bone, tendon or muscle are NOT exposed.  Wound Description (Comments):   Present on Admission:      Pressure Injury 11/26/20 Heel Right Unstageable - Full thickness tissue loss in which the base of the injury is covered by slough (yellow, tan, gray, green or brown) and/or eschar (tan, brown or black) in the wound bed. (Active)  11/26/20 1314  Location: Heel  Location Orientation: Right  Staging: Unstageable - Full thickness tissue loss in which the base of the injury is covered by slough (yellow, tan, gray, green or brown) and/or eschar (tan, brown or black) in the wound bed.  Wound Description (Comments):   Present on Admission:        Code Status:     Code Status Orders  (From admission, onward)         Start     Ordered   11/24/20 1310  Do not attempt resuscitation (DNR)  Continuous       Question Answer Comment  In the event of cardiac or respiratory ARREST Do not call a "code blue"   In the event of cardiac or respiratory ARREST Do not  perform Intubation, CPR, defibrillation or ACLS   In the event of  cardiac or respiratory ARREST Use medication by any route, position, wound care, and other measures to relive pain and suffering. May use oxygen, suction and manual treatment of airway obstruction as needed for comfort.   Comments Code status was discussed with patient and she is a DNR      11/24/20 1313        Code Status History    Date Active Date Inactive Code Status Order ID Comments User Context   09/13/2020 0336 09/14/2020 1648 Full Code [id number]  Sidney Ace Arvella Merles, MD ED   08/29/2020 1654 09/05/2020 2107 Full Code [id number]  Vashti Hey, MD ED   06/16/2017 1852 06/20/2017 2044 Full Code [id number]  Saundra Shelling, MD Inpatient   08/16/2016 2029 08/20/2016 0225 Full Code [id number]  Demetrios Loll, MD Inpatient   10/13/2015 2210 10/17/2015 1831 Full Code [id number]  Aldean Jewett, MD Inpatient   Advance Care Planning Activity     Family Communication: Husband at the bedside Disposition Plan: Status is: Inpatient  Dispo: The patient is from: Home              Anticipated d/c is to: Home versus rehab              Anticipated d/c date is: Early next week potentially 12/05/2020. Will need PermCath and outpatient dialysis slot prior to disposition.              Patient currently postop day 1 left AKA.  Consultants:  Cardiology  Nephrology  Vascular surgery  Procedures:  Left AKA  Antibiotics:  Ancef today  Time spent: 27 minute  Brooke Hopkins

## 2020-12-01 NOTE — Progress Notes (Signed)
[facility]  ROUNDING NOTE   Subjective:   Patient found lying in bed, husband at bedside.  She denies worsening shortness of breath nausea or vomiting.  She underwent left above-the-knee amputation yesterday .  Surgical site with dressing intact. We will plan for dialysis later today.   Objective:  Vital signs in last 24 hours:  Temp:  [97.5 F (36.4 C)-98.8 F (37.1 C)] 98.2 F (36.8 C) (12/03 1137) Pulse Rate:  [75-147] 88 (12/03 1137) Resp:  [16-23] 16 (12/03 1137) BP: (98-161)/(56-96) 98/65 (12/03 1137) SpO2:  [85 %-100 %] 100 % (12/03 1137) Weight:  [127.5 kg] 127.5 kg (12/03 0415)  Weight change: 9.526 kg Filed Weights   11/29/20 0327 11/30/20 0309 12/01/20 0415  Weight: 122.9 kg 117.9 kg 127.5 kg    Intake/Output: I/O last 3 completed shifts: In: 1426.5 [P.O.:240; I.V.:500; Blood:636.5; IV Piggyback:50] Out: 1050 [Other:1000; Blood:50]   Intake/Output this shift:  Total I/O In: 240 [P.O.:240] Out: -   Physical Exam: General:  Resting in bed, in no acute distress, flat affect  Head:  Normocephalic, atraumatic  Eyes:  Anicteric  Lungs:   Respirations even and unlabored, lungs diminished  Heart:  Regular  Abdomen:  Soft, nontender, obese  Extremities: 2+ peripheral edema. Left AKA site with clear dressing  Neurologic:  Awake, alert, oriented  Skin:  Surgical site left AKA with dressing   Access Lt IJ Temporary catheter    Basic Metabolic Panel: Recent Labs  Lab 11/25/20 0723 11/25/20 0723 11/26/20 0502 11/26/20 0502 11/27/20 4580 11/27/20 9983 11/28/20 0559 11/30/20 0605 11/30/20 2350 12/01/20 0520  NA 140   < > 140  --  137  --  136 137 137  --   K 4.2   < > 4.1  --  4.0  --  3.9 3.8 4.2  --   CL 108   < > 105  --  101  --  98 100 101  --   CO2 19*   < > 19*  --  21*  --  23 26 24   --   GLUCOSE 89   < > 165*  --  198*  --  125* 92 136*  --   BUN 79*   < > 80*  --  81*  --  83* 55* 56*  --   CREATININE 4.88*   < > 5.08*  --   5.40*  --  5.61* 4.27* 4.42*  --   CALCIUM 7.9*   < > 7.3*   < > 7.4*   < > 7.4* 7.5* 8.0*  --   MG 1.8  --   --   --   --   --   --  1.5*  --  1.9   < > = values in this interval not displayed.    Liver Function Tests: Recent Labs  Lab 11/26/20 0502 11/27/20 0629 11/28/20 0559  AST 160* 89* 56*  ALT 273* 209* 161*  ALKPHOS 89 85 79  BILITOT 0.7 1.0 0.6  PROT 5.6* 5.4* 5.5*  ALBUMIN 2.4* 2.3* 2.1*   No results for input(s): LIPASE, AMYLASE in the last 168 hours. No results for input(s): AMMONIA in the last 168 hours.  CBC: Recent Labs  Lab 11/26/20 0502 11/26/20 0502 11/27/20 0629 11/28/20 0559 11/30/20 0605 11/30/20 2350 12/01/20 0520  WBC 10.8*   < > 12.5* 13.8* 10.4 11.5* 9.3  NEUTROABS 8.6*  --  10.3* 10.8*  --   --   --  HGB 9.5*   < > 9.0* 8.6* 8.9* 9.5* 8.8*  HCT 31.2*   < > 29.7* 27.8* 29.0* 30.3* 28.4*  MCV 82.5   < > 81.8 79.9* 79.0* 79.5* 79.6*  PLT 217   < > 195 196 191 202 203   < > = values in this interval not displayed.    Cardiac Enzymes: No results for input(s): CKTOTAL, CKMB, CKMBINDEX, TROPONINI in the last 168 hours.  BNP: Invalid input(s): POCBNP  CBG: Recent Labs  Lab 11/30/20 1454 11/30/20 1659 11/30/20 2103 12/01/20 0823 12/01/20 1131  GLUCAP 100* 134* 130* 193* 253*    Microbiology: Results for orders placed or performed during the hospital encounter of 11/24/20  Resp Panel by RT-PCR (Flu A&B, Covid) Nasopharyngeal Swab     Status: None   Collection Time: 11/24/20  9:35 AM   Specimen: Nasopharyngeal Swab; Nasopharyngeal(NP) swabs in vial transport medium  Result Value Ref Range Status   SARS Coronavirus 2 by RT PCR NEGATIVE NEGATIVE Final    Comment: (NOTE) SARS-CoV-2 target nucleic acids are NOT DETECTED.  The SARS-CoV-2 RNA is generally detectable in upper respiratory specimens during the acute phase of infection. The lowest concentration of SARS-CoV-2 viral copies this assay can detect is 138 copies/mL. A negative  result does not preclude SARS-Cov-2 infection and should not be used as the sole basis for treatment or other patient management decisions. A negative result may occur with  improper specimen collection/handling, submission of specimen other than nasopharyngeal swab, presence of viral mutation(s) within the areas targeted by this assay, and inadequate number of viral copies(<138 copies/mL). A negative result must be combined with clinical observations, patient history, and epidemiological information. The expected result is Negative.  Fact Sheet for Patients:  EntrepreneurPulse.com.au  Fact Sheet for Healthcare Providers:  IncredibleEmployment.be  This test is no t yet approved or cleared by the [place] FDA and  has been authorized for detection and/or diagnosis of SARS-CoV-2 by FDA under an Emergency Use Authorization (EUA). This EUA will remain  in effect (meaning this test can be used) for the duration of the COVID-19 declaration under Section 564(b)(1) of the Act, 21 U.S.C.section 360bbb-3(b)(1), unless the authorization is terminated  or revoked sooner.       Influenza A by PCR NEGATIVE NEGATIVE Final   Influenza B by PCR NEGATIVE NEGATIVE Final    Comment: (NOTE) The Xpert Xpress SARS-CoV-2/FLU/RSV plus assay is intended as an aid in the diagnosis of influenza from Nasopharyngeal swab specimens and should not be used as a sole basis for treatment. Nasal washings and aspirates are unacceptable for Xpert Xpress SARS-CoV-2/FLU/RSV testing.  Fact Sheet for Patients: EntrepreneurPulse.com.au  Fact Sheet for Healthcare Providers: IncredibleEmployment.be  This test is not yet approved or cleared by the [place] FDA and has been authorized for detection and/or diagnosis of SARS-CoV-2 by FDA under an Emergency Use Authorization (EUA). This EUA will remain in effect (meaning this test can be used)  for the duration of the COVID-19 declaration under Section 564(b)(1) of the Act, 21 U.S.C. section 360bbb-3(b)(1), unless the authorization is terminated or revoked.  Performed at [facility], [address] [place], [place] [address]     Coagulation Studies: Recent Labs    11/30/20 0605  LABPROT 23.1*  INR 2.1*    Urinalysis: No results for input(s): COLORURINE, LABSPEC, PHURINE, GLUCOSEU, HGBUR, BILIRUBINUR, KETONESUR, PROTEINUR, UROBILINOGEN, NITRITE, LEUKOCYTESUR in the last 72 hours.  Invalid input(s): APPERANCEUR    Imaging: No results found.  Medications:   . sodium chloride 250 mL (11/30/20 2018)  . amiodarone    .  ceFAZolin (ANCEF) IV 1 g (11/30/20 2137)   . apixaban  5 mg Oral BID  . Chlorhexidine Gluconate Cloth  6 each Topical Q0600  . Chlorhexidine Gluconate Cloth  6 each Topical Once  . clopidogrel  75 mg Oral Daily  . collagenase   Topical Daily  . feeding supplement (NEPRO CARB STEADY)  237 mL Oral BID BM  . insulin aspart  0-6 Units Subcutaneous TID WC   sodium chloride, acetaminophen, HYDROcodone-acetaminophen, ondansetron (ZOFRAN) IV  Assessment/ Plan:  Ms. Brooke Hopkins is a 69 y.o. white female with hypertension, diabetes mellitus type II insulin dependent, peripheral arterial disease, CVA, lymphedema, chronic lower extremity wounds, bed bound who was admitted to [facility] on 11/24/2020 for New onset atrial fibrillation ([place]) [I48.91] Elevated troponin [R77.8] Atrial fibrillation with rapid ventricular response ([place]) [I48.91] Acute renal failure, unspecified acute renal failure type ([place]) [N17.9]  # Acute renal failure with metabolic acidosis on chronic kidney disease stage IIIA:  Baseline creatinine of 1.03, GFR of 55 on 09/13/2020  History of proteinuria, hematuria and glycosuria.  Acute renal failure secondary to poor PO intake, nausea, vomiting, acute cardiorenal syndrome and suspect ATN.    Patient received her first  dialysis treatment on November 30 after establishing temporary catheter on her left IJ Planning for dialysis again today PermCath placement, next week Discussed with husband and patient regarding peritoneal dialysis interest   #Anemia with renal failure Lab Results  Component Value Date   HGB 8.8 (L) 12/01/2020  Will consider Epogen with dialysis treatments  #Diabetes mellitus type II with CKD   Lab Results  Component Value Date   HGBA1C 7.1 (H) 11/27/2020  Patient is on insulin aspart  #Atrial fibrillation Maintaining sinus rhythm now Patient is on Amiodarone p.o.    LOS: 7 Karlos Scadden 12/3/20212:38 PM

## 2020-12-01 NOTE — Progress Notes (Signed)
   11/30/20 1929  Assess: MEWS Score  Temp 98.1 F (36.7 C)  BP (!) 134/96  Pulse Rate (!) 147  Resp 19  SpO2 100 %  O2 Device Nasal Cannula  O2 Flow Rate (L/min) 2 L/min  Assess: MEWS Score  MEWS Temp 0  MEWS Systolic 0  MEWS Pulse 3  MEWS RR 0  MEWS LOC 0  MEWS Score 3  MEWS Score Color Yellow  Assess: if the MEWS score is Yellow or Red  Were vital signs taken at a resting state? Yes  Focused Assessment No change from prior assessment  Early Detection of Sepsis Score *See Row Information* Low  MEWS guidelines implemented *See Row Information* Yes  Treat  MEWS Interventions Escalated (See documentation below)  Pain Scale 0-10  Pain Score 6  Pain Type Surgical pain  Take Vital Signs  Increase Vital Sign Frequency  Yellow: Q 2hr X 2 then Q 4hr X 2, if remains yellow, continue Q 4hrs  Escalate  MEWS: Escalate Yellow: discuss with charge nurse/RN and consider discussing with provider and RRT  Notify: Charge Nurse/RN  Name of Charge Nurse/RN Notified Kristine Garbe  Date Charge Nurse/RN Notified 11/30/20  Time Charge Nurse/RN Notified 2100  Notify: Provider  Provider Name/Title [facility]  Date Provider Notified 11/30/20  Time Provider Notified 1945  Notification Type Page  Notification Reason Change in status  Response See new orders  Date of Provider Response 11/30/20  Time of Provider Response 1950  Document  Patient Outcome Not stable and remains on department  copy and pasted for Fara Chute RN

## 2020-12-01 NOTE — Progress Notes (Addendum)
     BRIEF OVERNIGHT PROGRESS REPORT  SUBJECTIVE: Patient with persistent afib with RVR refractory to current antiarrhythmics. HR is persistently between mid 130's-150's asymptomatic. Patient remains hemodynamically stable at the moment.  OBJECTIVE: On assessment, she was afebrile with most recent  blood pressure 113/74 mm Hg and pulse rate 145 beats/min. There were no new focal neurological deficits; patient has left hemiparesis from previous CVA otherwise she  alert and oriented to self.  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 Now with persistent Afib with RVR on Amiodarone 400 mg PO  New onset Afib+RVR   - EKG+Telemetry - Troponins trending down - TSH within normal limits - CXR on admission showed Pulmonary vascular congestion and possible small right pleural effusion - TTEcho LVEF 30-35% - Metoprolol 5mg  IV x 1 with no improvement - Continue Thromboembolism Risk Management with Eliquis - Will restart pharmacologic Cardioversion with Amiodarone 150mg  IV bolus, then 1mg /min x6 hrs, then 0.5mg /min for 18 hours. Discontinue  Amiodarone 400mg  PO bid  - Keep NPO for option of TEEcho + DCCardioversion if appropriate - Cardiology following  for other regimens, ablation,  Etc. - Discussed plan of care with on call cardiologist Dr. Altamese Cabal, BSN, MSN, DNP, CCRN, FNP-C  [facility] Nurse Practitioner  [facility]

## 2020-12-01 NOTE — Progress Notes (Signed)
Aware of patient needing outpatient hemodialysis center set up.

## 2020-12-01 NOTE — Progress Notes (Signed)
11/29/20 1026 11/29/20 1030 11/29/20 1045  Vitals  Temp 98.4 F (36.9 C)  --   --   Temp Source Oral  --   --   BP (!) 128/51 (!) 128/51 (!) 121/42  MAP (mmHg) 76 71 65  BP Location Right Wrist  --   --   Patient Position (if appropriate) Lying  --   --   Pulse Rate 78 73 70  Pulse Rate Source Monitor  --   --   ECG Heart Rate 76 73 72  Resp 20 14 (!) 23  During Hemodialysis Assessment  Blood Flow Rate (mL/min) 250 mL/min (250) 250 mL/min 250 mL/min  Arterial Pressure (mmHg) -80 mmHg -80 mmHg -90 mmHg  Venous Pressure (mmHg) 90 mmHg 90 mmHg 100 mmHg  Transmembrane Pressure (mmHg) 30 mmHg (30) 30 mmHg 30 mmHg  Ultrafiltration Rate (mL/min) 500 mL/min 500 mL/min 500 mL/min  Dialysate Flow Rate (mL/min) 300 ml/min (300) 300 ml/min 300 ml/min  Conductivity: Machine  14 14 14   HD Safety Checks Performed Yes Yes Yes  Dialysis Fluid Bolus Normal Saline  --   --   Bolus Amount (mL) 250 mL (250)  --   --   Intra-Hemodialysis Comments Tx initiated Progressing as prescribed Progressing as prescribed    11/29/20 1100 11/29/20 1115 11/29/20 1130  Vitals  Temp  --   --   --   Temp Source  --   --   --   BP (!) 106/35 (!) 118/53 (!) 135/51  MAP (mmHg) (!) 56 73 76  BP Location  --   --   --   Patient Position (if appropriate)  --   --   --   Pulse Rate 68 70 72  Pulse Rate Source  --   --   --   ECG Heart Rate 70 72 75  Resp (!) 23 18 (!) 21  During Hemodialysis Assessment  Blood Flow Rate (mL/min) 250 mL/min 250 mL/min 250 mL/min  Arterial Pressure (mmHg) -80 mmHg -80 mmHg -80 mmHg  Venous Pressure (mmHg) 90 mmHg 90 mmHg 100 mmHg  Transmembrane Pressure (mmHg) 30 mmHg 30 mmHg 30 mmHg  Ultrafiltration Rate (mL/min) 500 mL/min 500 mL/min 500 mL/min  Dialysate Flow Rate (mL/min) 300 ml/min 300 ml/min 300 ml/min  Conductivity: Machine  14 14 14   HD Safety Checks Performed Yes Yes Yes  Dialysis Fluid Bolus  --   --   --   Bolus Amount (mL)  --   --   --   Intra-Hemodialysis  Comments Progressing as prescribed Progressing as prescribed Progressing as prescribed    11/29/20 1145 11/29/20 1200  Vitals  Temp  --   --   Temp Source  --   --   BP (!) 141/88 (!) 147/53  MAP (mmHg) 103 81  BP Location  --   --   Patient Position (if appropriate)  --   --   Pulse Rate 78 78  Pulse Rate Source  --   --   ECG Heart Rate 80 80  Resp (!) 22 (!) 8  During Hemodialysis Assessment  Blood Flow Rate (mL/min) 250 mL/min 250 mL/min  Arterial Pressure (mmHg) -80 mmHg -80 mmHg  Venous Pressure (mmHg) 100 mmHg 100 mmHg  Transmembrane Pressure (mmHg) 30 mmHg 30 mmHg  Ultrafiltration Rate (mL/min) 500 mL/min 500 mL/min  Dialysate Flow Rate (mL/min) 300 ml/min 300 ml/min  Conductivity: Machine  14 14  HD Safety Checks Performed Yes Yes  Dialysis Fluid  Bolus  --   --   Bolus Amount (mL)  --   --   Intra-Hemodialysis Comments Progressing as prescribed Progressing as prescribed

## 2020-12-02 DIAGNOSIS — I4891 Unspecified atrial fibrillation: Secondary | ICD-10-CM | POA: Diagnosis not present

## 2020-12-02 DIAGNOSIS — I10 Essential (primary) hypertension: Secondary | ICD-10-CM | POA: Diagnosis not present

## 2020-12-02 DIAGNOSIS — D6869 Other thrombophilia: Secondary | ICD-10-CM | POA: Diagnosis not present

## 2020-12-02 DIAGNOSIS — N179 Acute kidney failure, unspecified: Secondary | ICD-10-CM | POA: Diagnosis not present

## 2020-12-02 DIAGNOSIS — I739 Peripheral vascular disease, unspecified: Secondary | ICD-10-CM | POA: Diagnosis not present

## 2020-12-02 DIAGNOSIS — M86272 Subacute osteomyelitis, left ankle and foot: Secondary | ICD-10-CM | POA: Diagnosis not present

## 2020-12-02 LAB — BASIC METABOLIC PANEL
Anion gap: 11 (ref 5–15)
BUN: 38 mg/dL — ABNORMAL HIGH (ref 8–23)
CO2: 27 mmol/L (ref 22–32)
Calcium: 7.8 mg/dL — ABNORMAL LOW (ref 8.9–10.3)
Chloride: 96 mmol/L — ABNORMAL LOW (ref 98–111)
Creatinine, Ser: 3.14 mg/dL — ABNORMAL HIGH (ref 0.44–1.00)
GFR, Estimated: 15 mL/min — ABNORMAL LOW (ref >60)
Glucose, Bld: 140 mg/dL — ABNORMAL HIGH (ref 70–99)
Potassium: 3.9 mmol/L (ref 3.5–5.1)
Sodium: 134 mmol/L — ABNORMAL LOW (ref 135–145)

## 2020-12-02 LAB — CBC
HCT: 26.3 % — ABNORMAL LOW (ref 36.0–46.0)
Hemoglobin: 8.2 g/dL — ABNORMAL LOW (ref 12.0–15.0)
MCH: 25 pg — ABNORMAL LOW (ref 26.0–34.0)
MCHC: 31.2 g/dL (ref 30.0–36.0)
MCV: 80.2 fL (ref 80.0–100.0)
Platelets: 209 10*3/uL (ref 150–400)
RBC: 3.28 MIL/uL — ABNORMAL LOW (ref 3.87–5.11)
RDW: 17.2 % — ABNORMAL HIGH (ref 11.5–15.5)
WBC: 10.1 10*3/uL (ref 4.0–10.5)
nRBC: 0 % (ref 0.0–0.2)

## 2020-12-02 LAB — GLUCOSE, CAPILLARY
Glucose-Capillary: 146 mg/dL — ABNORMAL HIGH (ref 70–99)
Glucose-Capillary: 139 mg/dL — ABNORMAL HIGH (ref 70–99)
Glucose-Capillary: 155 mg/dL — ABNORMAL HIGH (ref 70–99)
Glucose-Capillary: 131 mg/dL — ABNORMAL HIGH (ref 70–99)

## 2020-12-02 LAB — MAGNESIUM: Magnesium: 1.8 mg/dL (ref 1.7–2.4)

## 2020-12-02 LAB — PREPARE RBC (CROSSMATCH)

## 2020-12-02 MED ORDER — METOPROLOL TARTRATE 25 MG PO TABS
12.5000 mg | ORAL_TABLET | Freq: Two times a day (BID) | ORAL | Status: DC
  Administered 2020-12-02 – 2020-12-03 (×3): 12.5 mg via ORAL
  Filled 2020-12-02 (×3): qty 1

## 2020-12-02 MED ORDER — POLYETHYLENE GLYCOL 3350 17 G PO PACK
17.0000 g | PACK | Freq: Every day | ORAL | Status: DC
  Administered 2020-12-02 – 2020-12-04 (×3): 17 g via ORAL
  Filled 2020-12-02 (×4): qty 1

## 2020-12-02 NOTE — Progress Notes (Signed)
[facility] Consultation       MRN : [id number]  ADELAIDA REINDEL is a 69 y.o. (1951-07-29) female who presents with chief complaint of  Chief Complaint  Patient presents with  . Atrial Fibrillation  .  History of Present Illness: 69 year old female admitted for left heel osteomyelitis is now status post left above-knee amputation.  She is doing well other than some pain.  She was having drainage therefore a Praveena was placed.  Patient is eating comfortably and tolerating her pain.  Current Facility-Administered Medications  Medication Dose Route Frequency Provider Last Rate Last Admin  . 0.9 %  sodium chloride infusion   Intravenous PRN Algernon Huxley, MD 10 mL/hr at 12/01/20 2213 250 mL at 12/01/20 2213  . acetaminophen (TYLENOL) tablet 650 mg  650 mg Oral Q4H PRN Algernon Huxley, MD   650 mg at 12/02/20 1058  . amiodarone (NEXTERONE PREMIX) 360-4.14 MG/200ML-% (1.8 mg/mL) IV infusion  60 mg/hr Intravenous Continuous Christell Faith M, PA-C 33.3 mL/hr at 12/02/20 1058 60 mg/hr at 12/02/20 1058  . apixaban (ELIQUIS) tablet 5 mg  5 mg Oral BID Stegmayer, Kimberly A, PA-C   5 mg at 12/02/20 1059  . Chlorhexidine Gluconate Cloth 2 % PADS 6 each  6 each Topical Q0600 Algernon Huxley, MD   6 each at 12/02/20 0603  . Chlorhexidine Gluconate Cloth 2 % PADS 6 each  6 each Topical Once Algernon Huxley, MD      . clopidogrel (PLAVIX) tablet 75 mg  75 mg Oral Daily Stegmayer, Kimberly A, PA-C   75 mg at 12/02/20 1059  . collagenase (SANTYL) ointment   Topical Daily Algernon Huxley, MD   Given at 12/02/20 1000  . feeding supplement (NEPRO CARB STEADY) liquid 237 mL  237 mL Oral BID BM Algernon Huxley, MD   237 mL at 12/02/20 1000  . HYDROcodone-acetaminophen (NORCO/VICODIN) 5-325 MG per tablet 1 tablet  1 tablet Oral Q6H PRN Algernon Huxley, MD   1 tablet at 12/02/20 1058  . insulin aspart (novoLOG) injection 0-6 Units  0-6 Units Subcutaneous TID WC Algernon Huxley, MD   3 Units at 12/01/20 1220  .  metoprolol tartrate (LOPRESSOR) tablet 12.5 mg  12.5 mg Oral BID Christell Faith M, PA-C   12.5 mg at 12/02/20 1059  . ondansetron (ZOFRAN) injection 4 mg  4 mg Intravenous Q6H PRN Algernon Huxley, MD   4 mg at 11/27/20 1612  . polyethylene glycol (MIRALAX / GLYCOLAX) packet 17 g  17 g Oral Daily Loletha Grayer, MD   17 g at 12/02/20 1231    Past Medical History:  Diagnosis Date  . Chronic kidney disease   . Diabetes mellitus   . Hypertension   . Stroke [facility])     Past Surgical History:  Procedure Laterality Date  . ABDOMINAL HYSTERECTOMY    . AMPUTATION Left 11/30/2020   Procedure: AMPUTATION ABOVE KNEE;  Surgeon: Algernon Huxley, MD;  Location: [facility] ORS;  Service: General;  Laterality: Left;  . APPLICATION OF WOUND VAC Left 11/30/2020   Procedure: APPLICATION OF WOUND VAC;  Surgeon: Algernon Huxley, MD;  Location: [facility] ORS;  Service: General;  Laterality: Left;  GAAC [id number]  . CARDIOVERSION N/A 11/27/2020   Procedure: CARDIOVERSION;  Surgeon: Wellington Hampshire, MD;  Location: [facility] ORS;  Service: Cardiovascular;  Laterality: N/A;  . CHOLECYSTECTOMY    . HAMMER TOE SURGERY    . KNEE ARTHROSCOPY    .  LOWER EXTREMITY ANGIOGRAPHY Left 09/01/2020   Procedure: Lower Extremity Angiography;  Surgeon: Katha Cabal, MD;  Location: [place] CV LAB;  Service: Cardiovascular;  Laterality: Left;  . LUMBAR DISC SURGERY  11/07/11  . LUMBAR WOUND DEBRIDEMENT  11/27/2011   Procedure: LUMBAR WOUND DEBRIDEMENT;  Surgeon: Eustace Moore;  Location: [place] NE[facility] ORS;  Service: Neurosurgery;  Laterality: N/A;  . TEMPORARY DIALYSIS CATHETER N/A 11/28/2020   Procedure: TEMPORARY DIALYSIS CATHETER;  Surgeon: Katha Cabal, MD;  Location: [place] CV LAB;  Service: Cardiovascular;  Laterality: N/A;  . TONSILLECTOMY      Social History Social History   Tobacco Use  . Smoking status: Never Smoker  . Smokeless tobacco: Never Used  Vaping Use  . Vaping Use: Never used  Substance Use Topics  . Alcohol  use: No  . Drug use: No    Family History Family History  Problem Relation Age of Onset  . CAD Mother   . Hypertension Mother   . COPD Father   . Diabetes Sister   . Hypertension Sister     No Known Allergies  Physical Examination  Vitals:   12/02/20 0036 12/02/20 0423 12/02/20 0850 12/02/20 1214  BP: 95/64 107/61 111/77 121/78  Pulse: (!) 125 (!) 110 92 (!) 131  Resp: 20 18 18 18   Temp: 98.3 F (36.8 C) 97.7 F (36.5 C) 98.3 F (36.8 C) 97.9 F (36.6 C)  TempSrc: Oral Oral Oral Oral  SpO2: 97% 99% 100% 98%  Weight:  133.8 kg    Height:       Body mass index is 46.2 kg/m.  Head: [place]/AT Ear/Nose/Throat: Hearing grossly intact, nares w/o erythema or drainage, oropharynx w/o Erythema/Exudate Eyes: PERRLA, EOMI.  Neck: Supple, no nuchal rigidity.  No JVD.  Pulmonary:  Good air movement, clear to auscultation bilaterally.  Cardiac: RRR, normal S1, S2, no Murmurs, rubs or gallops.  Gastrointestinal: soft, non-tender/non-distended. No guarding/reflex. No masses, surgical incisions, or scars. Musculoskeletal: M/S 5/5 throughout.  Extremities without ischemic changes.  No deformity or atrophy. No edema. Neurologic: CN 2-12 intact. Pain and light touch intact in extremities.  Symmetrical.  Speech is fluent. Motor exam as listed above. Psychiatric: Judgment intact, Mood & affect appropriate for pt's clinical situation. Dermatologic: No rashes or ulcers noted.  No cellulitis or open wounds. Lymph : No Cervical, Axillary, or Inguinal lymphadenopathy.  Left above-knee amputation is soft.  Vacuum dressing is in place.  Serous drainage only.  CBC Lab Results  Component Value Date   WBC 10.1 12/02/2020   HGB 8.2 (L) 12/02/2020   HCT 26.3 (L) 12/02/2020   MCV 80.2 12/02/2020   PLT 209 12/02/2020    BMET    Component Value Date/Time   NA 134 (L) 12/02/2020 0607   K 3.9 12/02/2020 0607   CL 96 (L) 12/02/2020 0607   CO2 27 12/02/2020 0607   GLUCOSE 140 (H) 12/02/2020  0607   BUN 38 (H) 12/02/2020 0607   CREATININE 3.14 (H) 12/02/2020 0607   CALCIUM 7.8 (L) 12/02/2020 0607   GFRNONAA 15 (L) 12/02/2020 0607   GFRAA >60 09/13/2020 0539   Estimated Creatinine Clearance: 23.5 mL/min (A) (by C-G formula based on SCr of 3.14 mg/dL (H)).  COAG Lab Results  Component Value Date   INR 2.1 (H) 11/30/2020   INR 0.9 09/12/2020    Assessment/Plan 69 year old who presented with a heel ulcer and osteomyelitis underwent a left above-knee amputation.  She had significant amount of serous drainage  therefore VAC dressing was placed.  She is doing well today very comfortable.   Shaune Spittle, MD Vascular and Endovascular Surgery  12/02/2020 1:45 PM

## 2020-12-02 NOTE — Progress Notes (Addendum)
Patient ID: LEENAH SEIDNER, female   DOB: [birth year], 69 y.o.   MRN: [id number] [facility] PROGRESS NOTE  JOURNI MOFFA LDJ:[id number] DOB: [birth year] DOA: 11/24/2020 PCP: Lynnell Jude, MD  HPI/Subjective: Patient states she has felt better.  Did not elaborate much on how she feels.  Answers no to most questions.  Does not feel her heart beating fast.  No chest pain or shortness of breath.  No pain in her left leg.  Objective: Vitals:   12/02/20 0850 12/02/20 1214  BP: 111/77 121/78  Pulse: 92 (!) 131  Resp: 18 18  Temp: 98.3 F (36.8 C) 97.9 F (36.6 C)  SpO2: 100% 98%    Intake/Output Summary (Last 24 hours) at 12/02/2020 1520 Last data filed at 12/01/2020 1920 Gross per 24 hour  Intake --  Output -400 ml  Net 400 ml   Filed Weights   11/30/20 0309 12/01/20 0415 12/02/20 0423  Weight: 117.9 kg 127.5 kg 133.8 kg    ROS: Review of Systems  Respiratory: Negative for cough and shortness of breath.   Cardiovascular: Negative for chest pain.  Gastrointestinal: Negative for abdominal pain, nausea and vomiting.   Exam: Physical Exam HENT:     Head: Normocephalic.     Mouth/Throat:     Pharynx: No oropharyngeal exudate.  Eyes:     General: Lids are normal.     Conjunctiva/sclera: Conjunctivae normal.     Pupils: Pupils are equal, round, and reactive to light.  Cardiovascular:     Rate and Rhythm: Tachycardia present. Rhythm irregularly irregular.     Heart sounds: Normal heart sounds, S1 normal and S2 normal.  Pulmonary:     Breath sounds: No decreased breath sounds, wheezing, rhonchi or rales.  Abdominal:     Palpations: Abdomen is soft.     Tenderness: There is no abdominal tenderness.  Musculoskeletal:     Left upper leg: Swelling present.     Right ankle: No swelling.  Skin:    General: Skin is warm.     Comments: Left AKA covered  Neurological:     Mental Status: She is alert and oriented to person, place, and time.       Data  Reviewed: Basic Metabolic Panel: Recent Labs  Lab 11/27/20 0629 11/28/20 0559 11/30/20 0605 11/30/20 2350 12/01/20 0520 12/02/20 0607  NA 137 136 137 137  --  134*  K 4.0 3.9 3.8 4.2  --  3.9  CL 101 98 100 101  --  96*  CO2 21* 23 26 24   --  27  GLUCOSE 198* 125* 92 136*  --  140*  BUN 81* 83* 55* 56*  --  38*  CREATININE 5.40* 5.61* 4.27* 4.42*  --  3.14*  CALCIUM 7.4* 7.4* 7.5* 8.0*  --  7.8*  MG  --   --  1.5*  --  1.9 1.8   Liver Function Tests: Recent Labs  Lab 11/26/20 0502 11/27/20 0629 11/28/20 0559  AST 160* 89* 56*  ALT 273* 209* 161*  ALKPHOS 89 85 79  BILITOT 0.7 1.0 0.6  PROT 5.6* 5.4* 5.5*  ALBUMIN 2.4* 2.3* 2.1*   No results for input(s): LIPASE, AMYLASE in the last 168 hours. No results for input(s): AMMONIA in the last 168 hours. CBC: Recent Labs  Lab 11/26/20 0502 11/26/20 0502 11/27/20 0300 11/27/20 0629 11/28/20 0559 11/30/20 0605 11/30/20 2350 12/01/20 0520 12/02/20 0607  WBC 10.8*   < > 12.5*   < > 13.8* 10.4  11.5* 9.3 10.1  NEUTROABS 8.6*  --  10.3*  --  10.8*  --   --   --   --   HGB 9.5*   < > 9.0*   < > 8.6* 8.9* 9.5* 8.8* 8.2*  HCT 31.2*   < > 29.7*   < > 27.8* 29.0* 30.3* 28.4* 26.3*  MCV 82.5   < > 81.8   < > 79.9* 79.0* 79.5* 79.6* 80.2  PLT 217   < > 195   < > 196 191 202 203 209   < > = values in this interval not displayed.   BNP (last 3 results) Recent Labs    09/13/20 1046  BNP 233.8*    CBG: Recent Labs  Lab 12/01/20 0823 12/01/20 1131 12/01/20 2041 12/02/20 0819 12/02/20 1214  GLUCAP 193* 253* 180* 146* 139*    Recent Results (from the past 240 hour(s))  Resp Panel by RT-PCR (Flu A&B, Covid) Nasopharyngeal Swab     Status: None   Collection Time: 11/24/20  9:35 AM   Specimen: Nasopharyngeal Swab; Nasopharyngeal(NP) swabs in vial transport medium  Result Value Ref Range Status   SARS Coronavirus 2 by RT PCR NEGATIVE NEGATIVE Final    Comment: (NOTE) SARS-CoV-2 target nucleic acids are NOT  DETECTED.  The SARS-CoV-2 RNA is generally detectable in upper respiratory specimens during the acute phase of infection. The lowest concentration of SARS-CoV-2 viral copies this assay can detect is 138 copies/mL. A negative result does not preclude SARS-Cov-2 infection and should not be used as the sole basis for treatment or other patient management decisions. A negative result may occur with  improper specimen collection/handling, submission of specimen other than nasopharyngeal swab, presence of viral mutation(s) within the areas targeted by this assay, and inadequate number of viral copies(<138 copies/mL). A negative result must be combined with clinical observations, patient history, and epidemiological information. The expected result is Negative.  Fact Sheet for Patients:  EntrepreneurPulse.com.au  Fact Sheet for Healthcare Providers:  IncredibleEmployment.be  This test is no t yet approved or cleared by the [place] FDA and  has been authorized for detection and/or diagnosis of SARS-CoV-2 by FDA under an Emergency Use Authorization (EUA). This EUA will remain  in effect (meaning this test can be used) for the duration of the COVID-19 declaration under Section 564(b)(1) of the Act, 21 U.S.C.section 360bbb-3(b)(1), unless the authorization is terminated  or revoked sooner.       Influenza A by PCR NEGATIVE NEGATIVE Final   Influenza B by PCR NEGATIVE NEGATIVE Final    Comment: (NOTE) The Xpert Xpress SARS-CoV-2/FLU/RSV plus assay is intended as an aid in the diagnosis of influenza from Nasopharyngeal swab specimens and should not be used as a sole basis for treatment. Nasal washings and aspirates are unacceptable for Xpert Xpress SARS-CoV-2/FLU/RSV testing.  Fact Sheet for Patients: EntrepreneurPulse.com.au  Fact Sheet for Healthcare Providers: IncredibleEmployment.be  This test is not yet  approved or cleared by the [place] FDA and has been authorized for detection and/or diagnosis of SARS-CoV-2 by FDA under an Emergency Use Authorization (EUA). This EUA will remain in effect (meaning this test can be used) for the duration of the COVID-19 declaration under Section 564(b)(1) of the Act, 21 U.S.C. section 360bbb-3(b)(1), unless the authorization is terminated or revoked.  Performed at [facility], [address] [place], [place] [address]      Studies: No results found.  Scheduled Meds: . apixaban  5 mg Oral BID  .  Chlorhexidine Gluconate Cloth  6 each Topical Q0600  . Chlorhexidine Gluconate Cloth  6 each Topical Once  . clopidogrel  75 mg Oral Daily  . collagenase   Topical Daily  . feeding supplement (NEPRO CARB STEADY)  237 mL Oral BID BM  . insulin aspart  0-6 Units Subcutaneous TID WC  . metoprolol tartrate  12.5 mg Oral BID  . polyethylene glycol  17 g Oral Daily   Continuous Infusions: . sodium chloride 250 mL (12/01/20 2213)  . amiodarone 60 mg/hr (12/02/20 1058)    Assessment/Plan:  1. Atrial fibrillation with rapid ventricular response.  Atrial fibrillation is paroxysmal in nature.  Patient on Eliquis for anticoagulation.  Patient was placed back on amiodarone drip after left AKA.  Continue low-dose metoprolol.  Will likely need a cardioversion on Monday morning. 2. Left heel osteomyelitis with full-thickness ulceration and necrosis of muscle and leukocytosis.  Left AKA done on 11/30/2020.  Discontinue antibiotics. 3. Acute renal failure on chronic kidney disease stage IIIa.  Creatinine increased on a daily basis with a peak of 5.61 on 11/28/2020.  Baseline creatinine 1.03.  Patient had 3 dialysis sessions here last being yesterday.  Will need a PermCath in an outpatient dialysis slot prior to disposition. 4. Acquired thrombophilia secondary to atrial fibrillation.  Eliquis to reduce stroke risk. 5. Stage III decubitus ulcer on buttock.   Present on admission.  See description below 6. Cirrhosis of liver seen on previous imaging study with elevated liver function test.  Continue to monitor closely while on amiodarone and anticoagulation 7. Bedbound status 8. Right knee pain.  X-ray showing degenerative changes 9. Morbid obesity.  Not sure if bed weight accurate after AKA because weight increased. 10. Anemia.  Hemoglobin dipped down to 8.2  Pressure Injury 11/26/20 Buttocks Left Stage 3 -  Full thickness tissue loss. Subcutaneous fat may be visible but bone, tendon or muscle are NOT exposed. (Active)  11/26/20 1313  Location: Buttocks  Location Orientation: Left  Staging: Stage 3 -  Full thickness tissue loss. Subcutaneous fat may be visible but bone, tendon or muscle are NOT exposed.  Wound Description (Comments):   Present on Admission:      Pressure Injury 11/26/20 Heel Right Unstageable - Full thickness tissue loss in which the base of the injury is covered by slough (yellow, tan, gray, green or brown) and/or eschar (tan, brown or black) in the wound bed. (Active)  11/26/20 1314  Location: Heel  Location Orientation: Right  Staging: Unstageable - Full thickness tissue loss in which the base of the injury is covered by slough (yellow, tan, gray, green or brown) and/or eschar (tan, brown or black) in the wound bed.  Wound Description (Comments):   Present on Admission:        Code Status:     Code Status Orders  (From admission, onward)         Start     Ordered   11/24/20 1310  Do not attempt resuscitation (DNR)  Continuous       Question Answer Comment  In the event of cardiac or respiratory ARREST Do not call a "code blue"   In the event of cardiac or respiratory ARREST Do not perform Intubation, CPR, defibrillation or ACLS   In the event of cardiac or respiratory ARREST Use medication by any route, position, wound care, and other measures to relive pain and suffering. May use oxygen, suction and manual  treatment of airway obstruction as needed for comfort.  Comments Code status was discussed with patient and she is a DNR      11/24/20 1313        Code Status History    Date Active Date Inactive Code Status Order ID Comments User Context   09/13/2020 0336 09/14/2020 1648 Full Code [id number]  Sidney Ace Arvella Merles, MD ED   08/29/2020 1654 09/05/2020 2107 Full Code [id number]  Vashti Hey, MD ED   06/16/2017 1852 06/20/2017 2044 Full Code [id number]  Saundra Shelling, MD Inpatient   08/16/2016 2029 08/20/2016 0225 Full Code [id number]  Demetrios Loll, MD Inpatient   10/13/2015 2210 10/17/2015 1831 Full Code [id number]  Aldean Jewett, MD Inpatient   Advance Care Planning Activity     Family Communication: Spoke with husband at the bedside Disposition Plan: Status is: Inpatient  Dispo: The patient is from: Home              Anticipated d/c is to: Home              Anticipated d/c date is: May be admitted with next week will have to have a cardioversion and PermCath dialysis catheter placed in an outpatient dialysis slot.              Patient currently not medically stable for disposition.  Consultants:  Cardiology  Nephrology  Vascular surgery  Procedures:  Left AKA  Time spent: 28 minutes, case discussed with cardiology  [place]

## 2020-12-02 NOTE — TOC Progression Note (Signed)
Transition of Care [facility]) - Progression Note    Patient Details  Name: Brooke Hopkins MRN: [id number] Date of Birth: 1951/02/15  Transition of [facility]) CM/SW Contact  [name], RN Phone Number: 12/02/2020, 3:29 PM  Clinical Narrative:  12/4 1530 Pt continues with elevated HR per cardiology [phone] 12/4 notes. Telemetry still shows Afib RVR. Osteomyelitis from Left AKA. Prior weakness from CVA. DC plan is to home (husband takes care of pt) per attending with admission next week for cardioversion and PermaCath placement. NMS today per attending at 1520. Simmie Davies RN CM    Expected Discharge Plan: [place] Barriers to Discharge: Continued Medical Work up  Expected Discharge Plan and Services Expected Discharge Plan: [place] In-house Referral: NA Discharge Planning Services: Other - See comment (Continue HH services RN/PT/OTJackquline [place]) Post Acute Care Choice: Home Health Living arrangements for the past 2 months: [place]                 DME Arranged: Hospital bed, Shower stool DME Agency: [facility] [place]: [facility] Date [place]: 11/28/20 Time [place]: [phone] Representative spoke with at [place]: [place] ([place]) Interventions    Readmission Risk Interventions Readmission Risk Prevention Plan 11/28/2020  Transportation Screening Complete  PCP or Specialist Appt within 5-7 Days Not Complete  Not Complete comments To be scheduled by unit secretary when discharged  [place] Screening Complete  Medication Review (RN CM) Complete  Some recent data might be hidden

## 2020-12-02 NOTE — Progress Notes (Signed)
[facility]  ROUNDING NOTE   Subjective:   Patient appears comfortable in bed, no shortness of breath, nausea or vomiting .she received her third dialysis treatment yesterday.     Objective:  Vital signs in last 24 hours:  Temp:  [97.7 F (36.5 C)-98.3 F (36.8 C)] 97.9 F (36.6 C) (12/04 1214) Pulse Rate:  [92-131] 131 (12/04 1214) Resp:  [17-24] 18 (12/04 1214) BP: (90-127)/(56-81) 121/78 (12/04 1214) SpO2:  [97 %-100 %] 98 % (12/04 1214) Weight:  [133.8 kg] 133.8 kg (12/04 0423)  Weight change: 6.35 kg Filed Weights   11/30/20 0309 12/01/20 0415 12/02/20 0423  Weight: 117.9 kg 127.5 kg 133.8 kg    Intake/Output: I/O last 3 completed shifts: In: 240 [P.O.:240] Out: -400    Intake/Output this shift:  No intake/output data recorded.  Physical Exam: General:  In no acute distress  Head:  Oral mucous membranes moist  Eyes:  Sclerae and conjunctivae clear  Lungs:   Lungs clear, but diminished at the bases, respirations symmetrical and unlabored  Heart:  S1S2, no rubs or gallops  Abdomen:  Soft, nontender, obese  Extremities: 2+ peripheral edema. Left AKA   Neurologic:  Speech clear and appropriate  Skin:  left AKA surgical site with dressing clean dry and intact   Access Lt IJ Temporary catheter    Basic Metabolic Panel: Recent Labs  Lab 11/27/20 0629 11/27/20 0629 11/28/20 0559 11/28/20 0559 11/30/20 0605 11/30/20 2350 12/01/20 0520 12/02/20 0607  NA 137  --  136  --  137 137  --  134*  K 4.0  --  3.9  --  3.8 4.2  --  3.9  CL 101  --  98  --  100 101  --  96*  CO2 21*  --  23  --  26 24  --  27  GLUCOSE 198*  --  125*  --  92 136*  --  140*  BUN 81*  --  83*  --  55* 56*  --  38*  CREATININE 5.40*  --  5.61*  --  4.27* 4.42*  --  3.14*  CALCIUM 7.4*   < > 7.4*   < > 7.5* 8.0*  --  7.8*  MG  --   --   --   --  1.5*  --  1.9 1.8   < > = values in this interval not displayed.    Liver Function Tests: Recent Labs  Lab 11/26/20 0502  11/27/20 0629 11/28/20 0559  AST 160* 89* 56*  ALT 273* 209* 161*  ALKPHOS 89 85 79  BILITOT 0.7 1.0 0.6  PROT 5.6* 5.4* 5.5*  ALBUMIN 2.4* 2.3* 2.1*   No results for input(s): LIPASE, AMYLASE in the last 168 hours. No results for input(s): AMMONIA in the last 168 hours.  CBC: Recent Labs  Lab 11/26/20 0502 11/26/20 0502 11/27/20 1017 11/27/20 5102 11/28/20 0559 11/30/20 5852 11/30/20 2350 12/01/20 0520 12/02/20 0607  WBC 10.8*   < > 12.5*   < > 13.8* 10.4 11.5* 9.3 10.1  NEUTROABS 8.6*  --  10.3*  --  10.8*  --   --   --   --   HGB 9.5*   < > 9.0*   < > 8.6* 8.9* 9.5* 8.8* 8.2*  HCT 31.2*   < > 29.7*   < > 27.8* 29.0* 30.3* 28.4* 26.3*  MCV 82.5   < > 81.8   < > 79.9* 79.0* 79.5* 79.6* 80.2  PLT 217   < > 195   < > 196 191 202 203 209   < > = values in this interval not displayed.    Cardiac Enzymes: No results for input(s): CKTOTAL, CKMB, CKMBINDEX, TROPONINI in the last 168 hours.  BNP: Invalid input(s): POCBNP  CBG: Recent Labs  Lab 12/01/20 0823 12/01/20 1131 12/01/20 2041 12/02/20 0819 12/02/20 1214  GLUCAP 193* 253* 180* 146* 139*    Microbiology: Results for orders placed or performed during the hospital encounter of 11/24/20  Resp Panel by RT-PCR (Flu A&B, Covid) Nasopharyngeal Swab     Status: None   Collection Time: 11/24/20  9:35 AM   Specimen: Nasopharyngeal Swab; Nasopharyngeal(NP) swabs in vial transport medium  Result Value Ref Range Status   SARS Coronavirus 2 by RT PCR NEGATIVE NEGATIVE Final    Comment: (NOTE) SARS-CoV-2 target nucleic acids are NOT DETECTED.  The SARS-CoV-2 RNA is generally detectable in upper respiratory specimens during the acute phase of infection. The lowest concentration of SARS-CoV-2 viral copies this assay can detect is 138 copies/mL. A negative result does not preclude SARS-Cov-2 infection and should not be used as the sole basis for treatment or other patient management decisions. A negative result may  occur with  improper specimen collection/handling, submission of specimen other than nasopharyngeal swab, presence of viral mutation(s) within the areas targeted by this assay, and inadequate number of viral copies(<138 copies/mL). A negative result must be combined with clinical observations, patient history, and epidemiological information. The expected result is Negative.  Fact Sheet for Patients:  EntrepreneurPulse.com.au  Fact Sheet for Healthcare Providers:  IncredibleEmployment.be  This test is no t yet approved or cleared by the [place] FDA and  has been authorized for detection and/or diagnosis of SARS-CoV-2 by FDA under an Emergency Use Authorization (EUA). This EUA will remain  in effect (meaning this test can be used) for the duration of the COVID-19 declaration under Section 564(b)(1) of the Act, 21 U.S.C.section 360bbb-3(b)(1), unless the authorization is terminated  or revoked sooner.       Influenza A by PCR NEGATIVE NEGATIVE Final   Influenza B by PCR NEGATIVE NEGATIVE Final    Comment: (NOTE) The Xpert Xpress SARS-CoV-2/FLU/RSV plus assay is intended as an aid in the diagnosis of influenza from Nasopharyngeal swab specimens and should not be used as a sole basis for treatment. Nasal washings and aspirates are unacceptable for Xpert Xpress SARS-CoV-2/FLU/RSV testing.  Fact Sheet for Patients: EntrepreneurPulse.com.au  Fact Sheet for Healthcare Providers: IncredibleEmployment.be  This test is not yet approved or cleared by the [place] FDA and has been authorized for detection and/or diagnosis of SARS-CoV-2 by FDA under an Emergency Use Authorization (EUA). This EUA will remain in effect (meaning this test can be used) for the duration of the COVID-19 declaration under Section 564(b)(1) of the Act, 21 U.S.C. section 360bbb-3(b)(1), unless the authorization is terminated  or revoked.  Performed at [facility], [address] [place], [place] [address]     Coagulation Studies: Recent Labs    11/30/20 0605  LABPROT 23.1*  INR 2.1*    Urinalysis: No results for input(s): COLORURINE, LABSPEC, PHURINE, GLUCOSEU, HGBUR, BILIRUBINUR, KETONESUR, PROTEINUR, UROBILINOGEN, NITRITE, LEUKOCYTESUR in the last 72 hours.  Invalid input(s): APPERANCEUR    Imaging: No results found.   Medications:   . sodium chloride 250 mL (12/01/20 2213)  . amiodarone 60 mg/hr (12/02/20 1058)   . apixaban  5 mg Oral BID  . Chlorhexidine Gluconate  Cloth  6 each Topical [id number]  . Chlorhexidine Gluconate Cloth  6 each Topical Once  . clopidogrel  75 mg Oral Daily  . collagenase   Topical Daily  . feeding supplement (NEPRO CARB STEADY)  237 mL Oral BID BM  . insulin aspart  0-6 Units Subcutaneous TID WC  . metoprolol tartrate  12.5 mg Oral BID  . polyethylene glycol  17 g Oral Daily   sodium chloride, acetaminophen, HYDROcodone-acetaminophen, ondansetron (ZOFRAN) IV  Assessment/ Plan:  Brooke Hopkins is a 69 y.o. white female with hypertension, diabetes mellitus type II insulin dependent, peripheral arterial disease, CVA, lymphedema, chronic lower extremity wounds, bed bound who was admitted to [facility] on 11/24/2020 for New onset atrial fibrillation ([place]) [I48.91] Elevated troponin [R77.8] Atrial fibrillation with rapid ventricular response ([place]) [I48.91] Acute renal failure, unspecified acute renal failure type ([place]) [N17.9]  # Acute renal failure with metabolic acidosis on chronic kidney disease stage IIIA:  Baseline creatinine of 1.03, GFR of 55 on 09/13/2020  History of proteinuria, hematuria and glycosuria.  Acute renal failure secondary to poor PO intake, nausea, vomiting, acute cardiorenal syndrome and suspect ATN.   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

## 2020-12-02 NOTE — Progress Notes (Signed)
Progress Note  Patient Name: Brooke Hopkins Date of Encounter: 12/02/2020  Primary Cardiologist: Garen Lah  Subjective   Status post left AKA on 12/2. She redeveloped Afib with RVR on 12/2 at 19:15 with ventricular rates in the 120s to 140s bpm and remains in A. fib with RVR with ventricular rates in the 1 teens to 140s this morning.  No chest pain, dyspnea, palpitations, dizziness, or presyncope.  She did receive 5 mg of IV Lopressor yesterday evening.  Inpatient Medications    Scheduled Meds: . apixaban  5 mg Oral BID  . Chlorhexidine Gluconate Cloth  6 each Topical Q0600  . Chlorhexidine Gluconate Cloth  6 each Topical Once  . clopidogrel  75 mg Oral Daily  . collagenase   Topical Daily  . feeding supplement (NEPRO CARB STEADY)  237 mL Oral BID BM  . insulin aspart  0-6 Units Subcutaneous TID WC   Continuous Infusions: . sodium chloride 250 mL (12/01/20 2213)  . amiodarone 30 mg/hr (12/01/20 2221)   PRN Meds: sodium chloride, acetaminophen, HYDROcodone-acetaminophen, ondansetron (ZOFRAN) IV   Vital Signs    Vitals:   12/01/20 1920 12/01/20 2020 12/02/20 0036 12/02/20 0423  BP: 119/79 99/60 95/64  107/61  Pulse: (!) 123 (!) 129 (!) 125 (!) 110  Resp:  18 20 18   Temp:  98.2 F (36.8 C) 98.3 F (36.8 C) 97.7 F (36.5 C)  TempSrc:  Oral Oral Oral  SpO2:  100% 97% 99%  Weight:    133.8 kg  Height:        Intake/Output Summary (Last 24 hours) at 12/02/2020 0749 Last data filed at 12/01/2020 1920 Gross per 24 hour  Intake 240 ml  Output -400 ml  Net 640 ml   Filed Weights   11/30/20 0309 12/01/20 0415 12/02/20 0423  Weight: 117.9 kg 127.5 kg 133.8 kg    Telemetry    Afib with RVR redeveloped at 19:15 on 12/2 with ventricular rates in the 110s to 140s bpm - Personally Reviewed  ECG    Afib with RVR, 149 bpm, nonspecific st/t changes - Personally Reviewed  Physical Exam   GEN: No acute distress.   Neck: JVD difficult to assess secondary to body  habitus. Cardiac: Tachycardic, IRIR, I/VI systolic murmur, no rubs, or gallops.  Respiratory: Diminished breath sounds to anterior exam.  GI: Soft, nontender, non-distended.   MS: Status post left AKA. Neuro:  Alert and oriented x 3; Nonfocal.  Psych: Normal affect.  Labs    Chemistry Recent Labs  Lab 11/26/20 0502 11/26/20 0502 11/27/20 8295 11/27/20 6213 11/28/20 0559 11/28/20 0559 11/30/20 0605 11/30/20 2350 12/02/20 0607  NA 140   < > 137   < > 136   < > 137 137 134*  K 4.1   < > 4.0   < > 3.9   < > 3.8 4.2 3.9  CL 105   < > 101   < > 98   < > 100 101 96*  CO2 19*   < > 21*   < > 23   < > 26 24 27   GLUCOSE 165*   < > 198*   < > 125*   < > 92 136* 140*  BUN 80*   < > 81*   < > 83*   < > 55* 56* 38*  CREATININE 5.08*   < > 5.40*   < > 5.61*   < > 4.27* 4.42* 3.14*  CALCIUM 7.3*   < >  7.4*   < > 7.4*   < > 7.5* 8.0* 7.8*  PROT 5.6*  --  5.4*  --  5.5*  --   --   --   --   ALBUMIN 2.4*  --  2.3*  --  2.1*  --   --   --   --   AST 160*  --  89*  --  56*  --   --   --   --   ALT 273*  --  209*  --  161*  --   --   --   --   ALKPHOS 89  --  85  --  79  --   --   --   --   BILITOT 0.7  --  1.0  --  0.6  --   --   --   --   GFRNONAA 9*   < > 8*   < > 8*   < > 11* 10* 15*  ANIONGAP 16*   < > 15   < > 15   < > 11 12 11    < > = values in this interval not displayed.     Hematology Recent Labs  Lab 11/30/20 2350 12/01/20 0520 12/02/20 0607  WBC 11.5* 9.3 10.1  RBC 3.81* 3.57* 3.28*  HGB 9.5* 8.8* 8.2*  HCT 30.3* 28.4* 26.3*  MCV 79.5* 79.6* 80.2  MCH 24.9* 24.6* 25.0*  MCHC 31.4 31.0 31.2  RDW 17.0* 17.0* 17.2*  PLT 202 203 209    Cardiac EnzymesNo results for input(s): TROPONINI in the last 168 hours. No results for input(s): TROPIPOC in the last 168 hours.   BNPNo results for input(s): BNP, PROBNP in the last 168 hours.   DDimer No results for input(s): DDIMER in the last 168 hours.   Radiology    DG Knee 1-2 Views Right  Result Date:  11/29/2020 IMPRESSION: Negative for fracture.  Tricompartmental degenerative change. Electronically Signed   By: Franchot Gallo M.D.   On: 11/29/2020 10:28    Cardiac Studies   2D echo 11/25/2020: 1. Left ventricular ejection fraction, by estimation, is 30 to 35%. The  left ventricle has moderately decreased function. The left ventricle  demonstrates global hypokinesis. Unable to exclude regional wall motion  abnormality, select images with  hypokinesis of the anterior and anteroseptal wall though not well  visualized. Left ventricular diastolic parameters are indeterminate.  2. Right ventricular systolic function is normal. The right ventricular  size is normal. There is mildly elevated pulmonary artery systolic  pressure. The estimated right ventricular systolic pressure is 16.1 mmHg.  3. Left atrial size was moderately dilated.  4. Right atrial size was moderately dilated.  5. Tricuspid valve regurgitation is moderate.  6. Mild mitral valve regurgitation.  7. A small pericardial effusion is present.  8. Rhythm is atrial fibrillation with RVR  Patient Profile     69 y.o. female with history of CVA, PAD s/p left popliteal and AT stenting, CKD stage III, chronic lymphedema, decubitus ulcers, DM2, HTN, and obesity who we are seeing of Afib with RVR and hypotension requiring emergent DCCV.  Assessment & Plan    1. Afib with RVR: -She presented on 11/26 and required emergent DCCV in the ED secondary to hypotension. She redeveloped Afib with RVR and was placed on amiodarone gtt with subsequent a repeat DCCV on 11/29 -Throughout this admission, medication options have been limited secondary to hypotension -She redeveloped Afib with RVR on 12/2  at 19:15 with ventricular rates in the 120s to 140s bpm and remains in Afib with RVR at this time with ventricular rates in the 1 teens to 140s bpm bpm -Overall, this is a difficult situation given recurrence of Afib and limited rate  controlling options with relative hypotension precluding beta blocker or calcium channel blocker as well as ESRD precluding digoxin -Lopressor 25 mg bid added 12/2 with holding parameters, stopped by vascular surgery -Restart Lopressor at 12.5 mg bid with holding parameters for rate control -Continue amiodarone gtt for rate control, will increase back up to full dose -If she remains in Afib on 12/6, plan for repeat DCCV at that time -CHADS2VASc at least 7 -Resumed on Eliquis 12/2 by vascular surgery    2. Elevated troponin: -She denies any chest pain -Mildly elevated and felt to be in the setting of Afib with RVR -Echo this admission with an EF of 35%, possibly stress or tachy-mediated -Outpatient follow up  3. Cardiomyopathy: -Uncertain etiology -Cannot exclude stress vs tachy-mediated vs ischemic -Denies active angina -Will need outpatient ischemic evaluation -Restart Lopressor for now as above with consideration to transition to Toprol XL or Coreg if possible -Not currently on ACEi/ARB/Entresto/MRA secondary to AKI and hypotension   4. Osteomyelitis: -Status post left AKA 12/2 -Postoperative stress likely contributing to recurrence of Afib -Per primary and vascular surgery   5. Acute on CKD stage III: -Per IM  6. Anemia: -Likely mixed in etiology with chronic disease and ABL from surgery -Low thought stable, monitor on OAC/Plavix -Per IM  7. PAD: -Followed by VVS -Plavix resumed by vascular surgery   For questions or updates, please contact [facility] Please consult [email or web address] for contact info under Cardiology/STEMI.    Signed, Christell Faith, PA-C [place] Pager: [phone] 12/02/2020, 7:49 AM

## 2020-12-03 DIAGNOSIS — I5020 Unspecified systolic (congestive) heart failure: Secondary | ICD-10-CM | POA: Diagnosis not present

## 2020-12-03 DIAGNOSIS — I4891 Unspecified atrial fibrillation: Secondary | ICD-10-CM | POA: Diagnosis not present

## 2020-12-03 DIAGNOSIS — I10 Essential (primary) hypertension: Secondary | ICD-10-CM | POA: Diagnosis not present

## 2020-12-03 DIAGNOSIS — M869 Osteomyelitis, unspecified: Secondary | ICD-10-CM

## 2020-12-03 DIAGNOSIS — I739 Peripheral vascular disease, unspecified: Secondary | ICD-10-CM | POA: Diagnosis not present

## 2020-12-03 LAB — BPAM RBC
Blood Product Expiration Date: 202112172359
Blood Product Expiration Date: 202112262359
Blood Product Expiration Date: 202201032359
Blood Product Expiration Date: 202201032359
Blood Product Expiration Date: 202201082359
Blood Product Expiration Date: 202201082359
Blood Product Expiration Date: 202201082359
Blood Product Expiration Date: 202201082359
Unit Type and Rh: 1700
Unit Type and Rh: 5100
Unit Type and Rh: 5100
Unit Type and Rh: 5100
Unit Type and Rh: 5100
Unit Type and Rh: 9500
Unit Type and Rh: 9500
Unit Type and Rh: 9500

## 2020-12-03 LAB — CBC
HCT: 28.8 % — ABNORMAL LOW (ref 36.0–46.0)
Hemoglobin: 8.7 g/dL — ABNORMAL LOW (ref 12.0–15.0)
MCH: 24.5 pg — ABNORMAL LOW (ref 26.0–34.0)
MCHC: 30.2 g/dL (ref 30.0–36.0)
MCV: 81.1 fL (ref 80.0–100.0)
Platelets: 221 10*3/uL (ref 150–400)
RBC: 3.55 MIL/uL — ABNORMAL LOW (ref 3.87–5.11)
RDW: 16.9 % — ABNORMAL HIGH (ref 11.5–15.5)
WBC: 8.6 10*3/uL (ref 4.0–10.5)
nRBC: 0 % (ref 0.0–0.2)

## 2020-12-03 LAB — GLUCOSE, CAPILLARY
Glucose-Capillary: 125 mg/dL — ABNORMAL HIGH (ref 70–99)
Glucose-Capillary: 121 mg/dL — ABNORMAL HIGH (ref 70–99)
Glucose-Capillary: 124 mg/dL — ABNORMAL HIGH (ref 70–99)
Glucose-Capillary: 194 mg/dL — ABNORMAL HIGH (ref 70–99)

## 2020-12-03 LAB — BASIC METABOLIC PANEL
Anion gap: 12 (ref 5–15)
BUN: 45 mg/dL — ABNORMAL HIGH (ref 8–23)
CO2: 25 mmol/L (ref 22–32)
Calcium: 7.8 mg/dL — ABNORMAL LOW (ref 8.9–10.3)
Chloride: 96 mmol/L — ABNORMAL LOW (ref 98–111)
Creatinine, Ser: 3.55 mg/dL — ABNORMAL HIGH (ref 0.44–1.00)
GFR, Estimated: 13 mL/min — ABNORMAL LOW (ref >60)
Glucose, Bld: 139 mg/dL — ABNORMAL HIGH (ref 70–99)
Potassium: 4.2 mmol/L (ref 3.5–5.1)
Sodium: 133 mmol/L — ABNORMAL LOW (ref 135–145)

## 2020-12-03 LAB — TYPE AND SCREEN
ABO/RH(D): B NEG
Antibody Screen: NEGATIVE
Unit division: 0
Unit division: 0
Unit division: 0
Unit division: 0
Unit division: 0
Unit division: 0
Unit division: 0
Unit division: 0

## 2020-12-03 MED ORDER — METOPROLOL TARTRATE 25 MG PO TABS
25.0000 mg | ORAL_TABLET | Freq: Two times a day (BID) | ORAL | Status: DC
  Administered 2020-12-03 – 2020-12-05 (×4): 25 mg via ORAL
  Filled 2020-12-03 (×4): qty 1

## 2020-12-03 NOTE — Progress Notes (Signed)
[facility] Consultation       MRN : [id number]  Brooke Hopkins is a 69 y.o. (May 18, 1951) female  History of Present Illness: Status post left above-knee amputation and permacath placement.  No complaints.  Comfortable resting in bed.  Awake and alert  Current Facility-Administered Medications  Medication Dose Route Frequency Provider Last Rate Last Admin  . 0.9 %  sodium chloride infusion   Intravenous PRN Algernon Huxley, MD 10 mL/hr at 12/01/20 2213 250 mL at 12/01/20 2213  . acetaminophen (TYLENOL) tablet 650 mg  650 mg Oral Q4H PRN Algernon Huxley, MD   650 mg at 12/02/20 1058  . amiodarone (NEXTERONE PREMIX) 360-4.14 MG/200ML-% (1.8 mg/mL) IV infusion  60 mg/hr Intravenous Continuous Rise Mu, PA-C 33.3 mL/hr at 12/03/20 0843 60 mg/hr at 12/03/20 0843  . apixaban (ELIQUIS) tablet 5 mg  5 mg Oral BID Stegmayer, Kimberly A, PA-C   5 mg at 12/03/20 0944  . Chlorhexidine Gluconate Cloth 2 % PADS 6 each  6 each Topical Q0600 Algernon Huxley, MD   6 each at 12/03/20 [phone]  . Chlorhexidine Gluconate Cloth 2 % PADS 6 each  6 each Topical Once Algernon Huxley, MD      . clopidogrel (PLAVIX) tablet 75 mg  75 mg Oral Daily Stegmayer, Kimberly A, PA-C   75 mg at 12/03/20 0943  . collagenase (SANTYL) ointment   Topical Daily Algernon Huxley, MD   Given at 12/02/20 1000  . feeding supplement (NEPRO CARB STEADY) liquid 237 mL  237 mL Oral BID BM Algernon Huxley, MD   237 mL at 12/03/20 0944  . HYDROcodone-acetaminophen (NORCO/VICODIN) 5-325 MG per tablet 1 tablet  1 tablet Oral Q6H PRN Algernon Huxley, MD   1 tablet at 12/02/20 1058  . insulin aspart (novoLOG) injection 0-6 Units  0-6 Units Subcutaneous TID WC Algernon Huxley, MD   1 Units at 12/02/20 1652  . metoprolol tartrate (LOPRESSOR) tablet 12.5 mg  12.5 mg Oral BID Christell Faith M, PA-C   12.5 mg at 12/03/20 5329  . ondansetron (ZOFRAN) injection 4 mg  4 mg Intravenous Q6H PRN Algernon Huxley, MD   4 mg at 11/27/20 1612  . polyethylene  glycol (MIRALAX / GLYCOLAX) packet 17 g  17 g Oral Daily Loletha Grayer, MD   17 g at 12/03/20 9242    Past Medical History:  Diagnosis Date  . Chronic kidney disease   . Diabetes mellitus   . Hypertension   . Stroke [facility])     Past Surgical History:  Procedure Laterality Date  . ABDOMINAL HYSTERECTOMY    . AMPUTATION Left 11/30/2020   Procedure: AMPUTATION ABOVE KNEE;  Surgeon: Algernon Huxley, MD;  Location: [facility] ORS;  Service: General;  Laterality: Left;  . APPLICATION OF WOUND VAC Left 11/30/2020   Procedure: APPLICATION OF WOUND VAC;  Surgeon: Algernon Huxley, MD;  Location: [facility] ORS;  Service: General;  Laterality: Left;  GAAC [id number]  . CARDIOVERSION N/A 11/27/2020   Procedure: CARDIOVERSION;  Surgeon: Wellington Hampshire, MD;  Location: [facility] ORS;  Service: Cardiovascular;  Laterality: N/A;  . CHOLECYSTECTOMY    . HAMMER TOE SURGERY    . KNEE ARTHROSCOPY    . LOWER EXTREMITY ANGIOGRAPHY Left 09/01/2020   Procedure: Lower Extremity Angiography;  Surgeon: Katha Cabal, MD;  Location: [facility] LAB;  Service: Cardiovascular;  Laterality: Left;  . LUMBAR DISC SURGERY  11/07/11  .  LUMBAR WOUND DEBRIDEMENT  11/27/2011   Procedure: LUMBAR WOUND DEBRIDEMENT;  Surgeon: Eustace Moore;  Location: [facility] ORS;  Service: Neurosurgery;  Laterality: N/A;  . TEMPORARY DIALYSIS CATHETER N/A 11/28/2020   Procedure: TEMPORARY DIALYSIS CATHETER;  Surgeon: Katha Cabal, MD;  Location: [facility];  Service: Cardiovascular;  Laterality: N/A;  . TONSILLECTOMY       No Known Allergies   Physical Examination  Vitals:   12/02/20 1646 12/02/20 2053 12/03/20 0318 12/03/20 0730  BP: 105/62 121/78 118/76 119/73  Pulse: (!) 124 (!) 130 (!) 103 (!) 110  Resp: 18   18  Temp: 98.7 F (37.1 C) 98.4 F (36.9 C) 98 F (36.7 C) 98.1 F (36.7 C)  TempSrc: Oral Oral Oral Oral  SpO2: 98% 100% 97% 100%  Weight:   123.8 kg   Height:       Body mass index is 42.76  kg/m.  Pulmonary:  Good air movement, clear to auscultation bilaterally.  Cardiac: RRR, normal S1, S2, no Murmurs, rubs or gallops.  Vascular: Wounds are healing nicely.  No untoward events.  No hematoma.  Gastrointestinal: soft, non-tender/non-distended. No guarding/reflex. No masses, surgical incisions, or scars. Neurologic: CN 2-12 intact. Pain and light touch intact in extremities.  Symmetrical.  Speech is fluent. Motor exam as listed above.  CBC Lab Results  Component Value Date   WBC 8.6 12/03/2020   HGB 8.7 (L) 12/03/2020   HCT 28.8 (L) 12/03/2020   MCV 81.1 12/03/2020   PLT 221 12/03/2020    BMET    Component Value Date/Time   NA 133 (L) 12/03/2020 0543   K 4.2 12/03/2020 0543   CL 96 (L) 12/03/2020 0543   CO2 25 12/03/2020 0543   GLUCOSE 139 (H) 12/03/2020 0543   BUN 45 (H) 12/03/2020 0543   CREATININE 3.55 (H) 12/03/2020 0543   CALCIUM 7.8 (L) 12/03/2020 0543   GFRNONAA 13 (L) 12/03/2020 0543   GFRAA >60 09/13/2020 0539   Estimated Creatinine Clearance: 19.8 mL/min (A) (by C-G formula based on SCr of 3.55 mg/dL (H)).  COAG Lab Results  Component Value Date   INR 2.1 (H) 11/30/2020   INR 0.9 09/12/2020     Assessment/Plan 69 year old status post permacath and left above-knee amputation.  Routine postoperative care.   Shaune Spittle, MD Vascular and Endovascular Surgery  12/03/2020 11:03 AM

## 2020-12-03 NOTE — Progress Notes (Signed)
Progress Note  Patient Name: Brooke Hopkins Date of Encounter: 12/03/2020  [facility] Cardiologist: Kate Sable, MD   Subjective   No acute events overnight.  Heart rate still elevated, although patient denies palpitations, chest pain or shortness of breath.  Denies pain.  Inpatient Medications    Scheduled Meds: . apixaban  5 mg Oral BID  . Chlorhexidine Gluconate Cloth  6 each Topical Q0600  . Chlorhexidine Gluconate Cloth  6 each Topical Once  . clopidogrel  75 mg Oral Daily  . collagenase   Topical Daily  . feeding supplement (NEPRO CARB STEADY)  237 mL Oral BID BM  . insulin aspart  0-6 Units Subcutaneous TID WC  . metoprolol tartrate  12.5 mg Oral BID  . polyethylene glycol  17 g Oral Daily   Continuous Infusions: . sodium chloride 250 mL (12/01/20 2213)  . amiodarone 60 mg/hr (12/03/20 0843)   PRN Meds: sodium chloride, acetaminophen, HYDROcodone-acetaminophen, ondansetron (ZOFRAN) IV   Vital Signs    Vitals:   12/02/20 2053 12/03/20 0318 12/03/20 0730 12/03/20 1139  BP: 121/78 118/76 119/73 123/78  Pulse: (!) 130 (!) 103 (!) 110 (!) 126  Resp:   18 18  Temp: 98.4 F (36.9 C) 98 F (36.7 C) 98.1 F (36.7 C) 98.6 F (37 C)  TempSrc: Oral Oral Oral Oral  SpO2: 100% 97% 100% 99%  Weight:  123.8 kg    Height:        Intake/Output Summary (Last 24 hours) at 12/03/2020 1254 Last data filed at 12/02/2020 1700 Gross per 24 hour  Intake --  Output 50 ml  Net -50 ml   Last 3 Weights 12/03/2020 12/02/2020 12/01/2020  Weight (lbs) 273 lb 295 lb 281 lb  Weight (kg) 123.832 kg 133.811 kg 127.461 kg  Some encounter information is confidential and restricted. Go to Review Flowsheets activity to see all data.      Telemetry    A. fib, heart rate 118-132- Personally Reviewed  ECG    Normal sinus rhythm- Personally Reviewed  Physical Exam   Physical exam GEN:No acute distress, morbidly obese Neck:No JVD Cardiac:Distant heart sounds,  irregularly irregular Respiratory:Poor inspiratory, decreased breath sounds at bases IN:OMVE, nontender, distended  HM:[id number] on right, left AKA noted Neuro:Nonfocal , generalized weakness Psych: Normal affect   Labs    High Sensitivity Troponin:   Recent Labs  Lab 11/24/20 0934 11/24/20 1128 11/24/20 1338 11/24/20 1528  TROPONINIHS 194* 190* 205* 212*      Chemistry Recent Labs  Lab 11/27/20 0629 11/27/20 0629 11/28/20 0559 11/30/20 0605 11/30/20 2350 12/02/20 0607 12/03/20 0543  NA 137   < > 136   < > 137 134* 133*  K 4.0   < > 3.9   < > 4.2 3.9 4.2  CL 101   < > 98   < > 101 96* 96*  CO2 21*   < > 23   < > 24 27 25   GLUCOSE 198*   < > 125*   < > 136* 140* 139*  BUN 81*   < > 83*   < > 56* 38* 45*  CREATININE 5.40*   < > 5.61*   < > 4.42* 3.14* 3.55*  CALCIUM 7.4*   < > 7.4*   < > 8.0* 7.8* 7.8*  PROT 5.4*  --  5.5*  --   --   --   --   ALBUMIN 2.3*  --  2.1*  --   --   --   --  AST 89*  --  56*  --   --   --   --   ALT 209*  --  161*  --   --   --   --   ALKPHOS 85  --  79  --   --   --   --   BILITOT 1.0  --  0.6  --   --   --   --   GFRNONAA 8*   < > 8*   < > 10* 15* 13*  ANIONGAP 15   < > 15   < > 12 11 12    < > = values in this interval not displayed.     Hematology Recent Labs  Lab 12/01/20 0520 12/02/20 0607 12/03/20 0543  WBC 9.3 10.1 8.6  RBC 3.57* 3.28* 3.55*  HGB 8.8* 8.2* 8.7*  HCT 28.4* 26.3* 28.8*  MCV 79.6* 80.2 81.1  MCH 24.6* 25.0* 24.5*  MCHC 31.0 31.2 30.2  RDW 17.0* 17.2* 16.9*  PLT 203 209 221    BNPNo results for input(s): BNP, PROBNP in the last 168 hours.   DDimer No results for input(s): DDIMER in the last 168 hours.   Radiology    No results found.  Cardiac Studies   echo 11/25/2020: 1. Left ventricular ejection fraction, by estimation, is 30 to 35%. The  left ventricle has moderately decreased function. The left ventricle  demonstrates global hypokinesis. Unable to exclude regional wall motion   abnormality, select images with  hypokinesis of the anterior and anteroseptal wall though not well  visualized. Left ventricular diastolic parameters are indeterminate.  2. Right ventricular systolic function is normal. The right ventricular  size is normal. There is mildly elevated pulmonary artery systolic  pressure. The estimated right ventricular systolic pressure is 29.4 mmHg.  3. Left atrial size was moderately dilated.  4. Right atrial size was moderately dilated.  5. Tricuspid valve regurgitation is moderate.  6. Mild mitral valve regurgitation.  7. A small pericardial effusion is present.  8. Rhythm is atrial fibrillation with RVR  Patient Profile     69 y.o. female withhistory of hypertension, HFrEF EF 30%CVA, PAD, CKD, morbid obesity, bedbound presenting with low blood sugars, found to have atrial fibrillation with rapid ventricular response. Status post DC cardioversion x2, now back in A. Fib.  Now placed on IV amiodarone, heart rate still elevated  Assessment & Plan    A/P: 1.  Paroxysmal atrial fibrillation -CHA2DS2-VASc: 7 - IV amiodarone -Plan for repeat DC cardioversion next week if patient stays in A. fib -Lopressor 12.5 mg twice daily - Eliquis 5 mg twice daily  2. HFrEF <30% -etiology unclear -Invasive/ischemic work-up not planned at this time.  Continue medical management. -Lopressor for now, consolidate to Toprol-XL when heart rates better -No ACE/ARB/Arni due to acute on CKD  3.  Hypertension -BP controlled  -Beta-blocker, amiodarone for now  4.CVA -Lipitor, Plavix, Eliquis  5.PAD, left osteomyelitis status post left AKA -Lipitor, Plavix -Management per vascular surgery  6.CKD -Acute on chronic renal dysfunction -Management as per renal  Greater than 50% was spent in counseling and coordination of care with patient Total encounter time 35 minutes        Signed, Kate Sable, MD  12/03/2020, 12:54 PM

## 2020-12-03 NOTE — Progress Notes (Signed)
[facility] NAME: Brooke Hopkins    MR#:  [id number]  DATE OF BIRTH:  June 21, 1951  SUBJECTIVE:   Son in the room. Patient denies any complaints. Trying to eat breakfast earlier. No other issues per RN. Continues to remain on amiodarone with heart rate jumping around 110 to 120s REVIEW OF SYSTEMS:   Review of Systems  Constitutional: Negative for chills, fever and weight loss.  HENT: Negative for ear discharge, ear pain and nosebleeds.   Eyes: Negative for blurred vision, pain and discharge.  Respiratory: Negative for sputum production, shortness of breath, wheezing and stridor.   Cardiovascular: Negative for chest pain, palpitations, orthopnea and PND.  Gastrointestinal: Negative for abdominal pain, diarrhea, nausea and vomiting.  Genitourinary: Negative for frequency and urgency.  Musculoskeletal: Negative for back pain and joint pain.  Neurological: Negative for sensory change, speech change, focal weakness and weakness.  Psychiatric/Behavioral: Negative for depression and hallucinations. The patient is not nervous/anxious.    Tolerating Diet:yesTolerating PT:bed bound status POA   DRUG ALLERGIES:  No Known Allergies  VITALS:  Blood pressure 122/76, pulse (!) 126, temperature 98.7 F (37.1 C), temperature source Oral, resp. rate 19, height 5\' 7"  (1.702 m), weight 123.8 kg, SpO2 98 %.  PHYSICAL EXAMINATION:   Physical Exam  GENERAL:  69 y.o.-year-old patient lying in the bed with no acute distress. Morbid obesity HEENT: Head atraumatic, normocephalic. Oropharynx and nasopharynx clear.  NECK:  Supple, no jugular venous distention. No thyroid enlargement, no tenderness.  LUNGS: Normal breath sounds bilaterally, no wheezing, rales, rhonchi. No use of accessory muscles of respiration.  CARDIOVASCULAR: S1, S2 normal. No murmurs, rubs, or gallops. Tachycardia irregularly irregular rhythm ABDOMEN: Soft, nontender, nondistended.  Bowel sounds present. No organomegaly or mass.  EXTREMITIES: left aka. covered with surgical dressing NEUROLOGIC: Cranial nerves II through XII are intact. No focal Motor or sensory deficits b/l.   PSYCHIATRIC:  patient is alert and oriented x 3.  SKIN: chronic decubitus  as below.   LABORATORY PANEL:  CBC Recent Labs  Lab 12/03/20 0543  WBC 8.6  HGB 8.7*  HCT 28.8*  PLT 221    Chemistries  Recent Labs  Lab 11/28/20 0559 11/30/20 0605 12/02/20 0607 12/02/20 0607 12/03/20 0543  NA 136   < > 134*   < > 133*  K 3.9   < > 3.9   < > 4.2  CL 98   < > 96*   < > 96*  CO2 23   < > 27   < > 25  GLUCOSE 125*   < > 140*   < > 139*  BUN 83*   < > 38*   < > 45*  CREATININE 5.61*   < > 3.14*   < > 3.55*  CALCIUM 7.4*   < > 7.8*   < > 7.8*  MG  --    < > 1.8  --   --   AST 56*  --   --   --   --   ALT 161*  --   --   --   --   ALKPHOS 79  --   --   --   --   BILITOT 0.6  --   --   --   --    < > = values in this interval not displayed.   Cardiac Enzymes No results for input(s): TROPONINI in the last 168 hours. RADIOLOGY:  No results  found. ASSESSMENT AND PLAN:  Brooke Hopkins is a 69 y.o. 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  Atrial fibrillation with rapid ventricular response.   Atrial fibrillation is paroxysmal in nature. --  Patient on Eliquis for anticoagulation.  -- Patient was placed back on amiodarone drip after left AKA.   --Continue low-dose metoprolol.   --Will likely need a cardioversion on Monday morning.d/w dr Garen Lah  Left heel osteomyelitis with full-thickness ulceration and necrosis of muscle and leukocytosis.  --s/p  Left AKA done on 11/30/2020.  Discontinue antibiotics.  Acute renal failure on chronic  kidney disease stage IIIa.  -- Creatinine increased on a daily basis with a peak of 5.61 on 11/28/2020. --Baseline creatinine 1.03. --  Patient had 3 dialysis sessions here last being yesterday.   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  -- Eliquis to reduce stroke risk.  Stage III decubitus ulcer on buttock.  Present on admission.  See description below  Cirrhosis of liver seen on previous imaging study with elevated liver function test.  -- Continue to monitor closely while on amiodarone and anticoagulation  Bedbound status  Right knee pain.  X-ray showing degenerative changes  Morbid obesity.  Not sure if bed weight accurate after AKA because weight increased.  Anemia.  Hemoglobin dipped down to 8.2  Pressure Injury 11/26/20 Buttocks Left Stage 3 -  Full thickness tissue loss. Subcutaneous fat may be visible but bone, tendon or muscle are NOT exposed. (Active)  11/26/20 1313  Location: Buttocks  Location Orientation: Left  Staging: Stage 3 -  Full thickness tissue loss. Subcutaneous fat may be visible but bone, tendon or muscle are NOT exposed.  Wound Description (Comments):   Present on Admission:      Pressure Injury 11/26/20 Heel Right Unstageable - Full thickness tissue loss in which the base of the injury is covered by slough (yellow, tan, gray, green or brown) and/or eschar (tan, brown or black) in the wound bed. (Active)  11/26/20 1314  Location: Heel  Location Orientation: Right  Staging: Unstageable - Full thickness tissue loss in which the base of the injury is covered by slough (yellow, tan, gray, green or brown) and/or eschar (tan, brown or black) in the wound bed.  Wound Description (Comments):   Present on Admission:    palliative care consultation placed. Patient has multiple comorbidities. Long-term  has poor prognosis     Procedures: IJ temporary catheter, status post amputation left a.k.a. Family communication : son in the room Consults : cardiology, nephrology CODE STATUS: DNR prior to admission DVT Prophylaxis : eliquis  Status is: Inpatient  Remains inpatient appropriate because:IV treatments appropriate due to intensity of illness or inability to take PO   Dispo: The patient is from: Home              Anticipated d/c is to: Home              Anticipated d/c date is: 3 days              Patient currently is not medically stable to d/c.  Continues to remain on amiodarone drip. Heart rate is still  in the 110s. Plans for cardioversion likely tomorrow. She will need permanent dialysis catheter which likely is to be placed on Tuesday.      TOTAL TIME TAKING CARE OF THIS PATIENT: 25 minutes.  >50% time spent on counselling and coordination of care  Note: This dictation was prepared with Dragon dictation along with smaller phrase technology. Any transcriptional errors that result from this process are unintentional.  Fritzi Mandes M.D    [facility]   CC: Primary care physician; Lynnell Jude, MDPatient ID: Brooke Hopkins, female   DOB: 19-Feb-1951, 69 y.o.   MRN: [id number]

## 2020-12-03 NOTE — Progress Notes (Signed)
Cross Cover Patient on amiodarone for atrial fib RVR, also on oral metoprolol.  Rate control partially, down to 120 and pressures are stable.  There is concern with continued amiodarone infusion and borderline prolonged QT.  Metoprolol dose increased. Monitor QT

## 2020-12-03 NOTE — Progress Notes (Signed)
[facility]  ROUNDING NOTE   Subjective:   Patient resting in bed, denies worsening SOB,nausea or vomiting.   Objective:  Vital signs in last 24 hours:  Temp:  [98 F (36.7 C)-98.7 F (37.1 C)] 98.7 F (37.1 C) (12/05 1518) Pulse Rate:  [103-130] 126 (12/05 1518) Resp:  [18-19] 19 (12/05 1518) BP: (118-123)/(73-78) 122/76 (12/05 1518) SpO2:  [97 %-100 %] 98 % (12/05 1518) Weight:  [123.8 kg] 123.8 kg (12/05 0318)  Weight change: -9.979 kg Filed Weights   12/01/20 0415 12/02/20 0423 12/03/20 0318  Weight: 127.5 kg 133.8 kg 123.8 kg    Intake/Output: I/O last 3 completed shifts: In: -  Out: -350    Intake/Output this shift:  No intake/output data recorded.  Physical Exam: General:  Resting in bed, appears in no acute distress  Head:  Oral mucous membranes moist  Eyes:  Anicteric  Lungs:   Lungs diminished at the bases, respiration unlabored  Heart:  Irregular, tachycardic  Abdomen:  Soft, nontender, obese  Extremities: 2+ peripheral edema. Left AKA   Neurologic:  Awake,alert, oriented  Skin:  left AKA surgical site with dressing clean dry and intact   Access Lt IJ Temporary catheter    Basic Metabolic Panel: Recent Labs  Lab 11/28/20 0559 11/28/20 0559 11/30/20 0605 11/30/20 0605 11/30/20 2350 12/01/20 0520 12/02/20 0607 12/03/20 0543  NA 136  --  137  --  137  --  134* 133*  K 3.9  --  3.8  --  4.2  --  3.9 4.2  CL 98  --  100  --  101  --  96* 96*  CO2 23  --  26  --  24  --  27 25  GLUCOSE 125*  --  92  --  136*  --  140* 139*  BUN 83*  --  55*  --  56*  --  38* 45*  CREATININE 5.61*  --  4.27*  --  4.42*  --  3.14* 3.55*  CALCIUM 7.4*   < > 7.5*   < > 8.0*  --  7.8* 7.8*  MG  --   --  1.5*  --   --  1.9 1.8  --    < > = values in this interval not displayed.    Liver Function Tests: Recent Labs  Lab 11/27/20 0629 11/28/20 0559  AST 89* 56*  ALT 209* 161*  ALKPHOS 85 79  BILITOT 1.0 0.6  PROT 5.4* 5.5*  ALBUMIN 2.3* 2.1*    No results for input(s): LIPASE, AMYLASE in the last 168 hours. No results for input(s): AMMONIA in the last 168 hours.  CBC: Recent Labs  Lab 11/27/20 0629 11/27/20 0629 11/28/20 0559 11/28/20 0559 11/30/20 0605 11/30/20 2350 12/01/20 0520 12/02/20 0607 12/03/20 0543  WBC 12.5*   < > 13.8*   < > 10.4 11.5* 9.3 10.1 8.6  NEUTROABS 10.3*  --  10.8*  --   --   --   --   --   --   HGB 9.0*   < > 8.6*   < > 8.9* 9.5* 8.8* 8.2* 8.7*  HCT 29.7*   < > 27.8*   < > 29.0* 30.3* 28.4* 26.3* 28.8*  MCV 81.8   < > 79.9*   < > 79.0* 79.5* 79.6* 80.2 81.1  PLT 195   < > 196   < > 191 202 203 209 221   < > = values in this interval  not displayed.    Cardiac Enzymes: No results for input(s): CKTOTAL, CKMB, CKMBINDEX, TROPONINI in the last 168 hours.  BNP: Invalid input(s): POCBNP  CBG: Recent Labs  Lab 12/02/20 1646 12/02/20 2054 12/03/20 0736 12/03/20 1140 12/03/20 1648  GLUCAP 155* 131* 125* 121* 124*    Microbiology: Results for orders placed or performed during the hospital encounter of 11/24/20  Resp Panel by RT-PCR (Flu A&B, Covid) Nasopharyngeal Swab     Status: None   Collection Time: 11/24/20  9:35 AM   Specimen: Nasopharyngeal Swab; Nasopharyngeal(NP) swabs in vial transport medium  Result Value Ref Range Status   SARS Coronavirus 2 by RT PCR NEGATIVE NEGATIVE Final    Comment: (NOTE) SARS-CoV-2 target nucleic acids are NOT DETECTED.  The SARS-CoV-2 RNA is generally detectable in upper respiratory specimens during the acute phase of infection. The lowest concentration of SARS-CoV-2 viral copies this assay can detect is 138 copies/mL. A negative result does not preclude SARS-Cov-2 infection and should not be used as the sole basis for treatment or other patient management decisions. A negative result may occur with  improper specimen collection/handling, submission of specimen other than nasopharyngeal swab, presence of viral mutation(s) within the areas targeted  by this assay, and inadequate number of viral copies(<138 copies/mL). A negative result must be combined with clinical observations, patient history, and epidemiological information. The expected result is Negative.  Fact Sheet for Patients:  EntrepreneurPulse.com.au  Fact Sheet for Healthcare Providers:  IncredibleEmployment.be  This test is no t yet approved or cleared by the [place] FDA and  has been authorized for detection and/or diagnosis of SARS-CoV-2 by FDA under an Emergency Use Authorization (EUA). This EUA will remain  in effect (meaning this test can be used) for the duration of the COVID-19 declaration under Section 564(b)(1) of the Act, 21 U.S.C.section 360bbb-3(b)(1), unless the authorization is terminated  or revoked sooner.       Influenza A by PCR NEGATIVE NEGATIVE Final   Influenza B by PCR NEGATIVE NEGATIVE Final    Comment: (NOTE) The Xpert Xpress SARS-CoV-2/FLU/RSV plus assay is intended as an aid in the diagnosis of influenza from Nasopharyngeal swab specimens and should not be used as a sole basis for treatment. Nasal washings and aspirates are unacceptable for Xpert Xpress SARS-CoV-2/FLU/RSV testing.  Fact Sheet for Patients: EntrepreneurPulse.com.au  Fact Sheet for Healthcare Providers: IncredibleEmployment.be  This test is not yet approved or cleared by the [place] FDA and has been authorized for detection and/or diagnosis of SARS-CoV-2 by FDA under an Emergency Use Authorization (EUA). This EUA will remain in effect (meaning this test can be used) for the duration of the COVID-19 declaration under Section 564(b)(1) of the Act, 21 U.S.C. section 360bbb-3(b)(1), unless the authorization is terminated or revoked.  Performed at [facility], [address] [place], [place] [address]     Coagulation Studies: No results for input(s): LABPROT, INR in the  last 72 hours.  Urinalysis: No results for input(s): COLORURINE, LABSPEC, PHURINE, GLUCOSEU, HGBUR, BILIRUBINUR, KETONESUR, PROTEINUR, UROBILINOGEN, NITRITE, LEUKOCYTESUR in the last 72 hours.  Invalid input(s): APPERANCEUR    Imaging: No results found.   Medications:   . sodium chloride 250 mL (12/01/20 2213)  . amiodarone 60 mg/hr (12/03/20 1454)   . apixaban  5 mg Oral BID  . Chlorhexidine Gluconate Cloth  6 each Topical Q0600  . Chlorhexidine Gluconate Cloth  6 each Topical Once  . clopidogrel  75 mg Oral Daily  . collagenase  Topical Daily  . feeding supplement (NEPRO CARB STEADY)  237 mL Oral BID BM  . insulin aspart  0-6 Units Subcutaneous TID WC  . metoprolol tartrate  12.5 mg Oral BID  . polyethylene glycol  17 g Oral Daily   sodium chloride, acetaminophen, HYDROcodone-acetaminophen, ondansetron (ZOFRAN) IV  Assessment/ Plan:  Ms. Brooke Hopkins is a 69 y.o. white female with hypertension, diabetes mellitus type II insulin dependent, peripheral arterial disease, CVA, lymphedema, chronic lower extremity wounds, bed bound who was admitted to [facility] on 11/24/2020 for New onset atrial fibrillation ([place]) [I48.91] Elevated troponin [R77.8] Atrial fibrillation with rapid ventricular response ([place]) [I48.91] Acute renal failure, unspecified acute renal failure type ([place]) [N17.9]  # Acute renal failure with metabolic acidosis on chronic kidney disease stage IIIA:  Baseline creatinine of 1.03, GFR of 55 on 09/13/2020  History of proteinuria, hematuria and glycosuria.  Acute renal failure secondary to poor PO intake, nausea, vomiting, acute cardiorenal syndrome and suspect ATN.   We will assess patient tomorrow for dialysis requirement Planning for Permcath placement next week, possibly Tuesday Outpatient dialysis arrangements in process, not established yet  #Anemia with renal failure Lab Results  Component Value Date   HGB 8.7 (L) 12/03/2020    #Diabetes mellitus  type II with CKD   Lab Results  Component Value Date   HGBA1C 7.1 (H) 11/27/2020   Continue Insulin Aspart  #Left heel osteomyelitis Left AKA on 11/30/2020 Pain controlled, dressing on surgical site is clean, dry and intact Management per vascular surgery team  # Atrial Fibrillation with RVR Patient required Amiodarone infusion during this admission Now on Metoprolol Tartrate 12.5 mg BID Cardiology team planning for cardioversion tomorrow    LOS: 9 Princy Raju 12/5/20215:13 PM

## 2020-12-04 DIAGNOSIS — I4891 Unspecified atrial fibrillation: Secondary | ICD-10-CM | POA: Diagnosis not present

## 2020-12-04 LAB — GLUCOSE, CAPILLARY
Glucose-Capillary: 93 mg/dL (ref 70–99)
Glucose-Capillary: 142 mg/dL — ABNORMAL HIGH (ref 70–99)
Glucose-Capillary: 190 mg/dL — ABNORMAL HIGH (ref 70–99)
Glucose-Capillary: 256 mg/dL — ABNORMAL HIGH (ref 70–99)

## 2020-12-04 LAB — PHOSPHORUS: Phosphorus: 4 mg/dL (ref 2.5–4.6)

## 2020-12-04 LAB — MAGNESIUM: Magnesium: 1.8 mg/dL (ref 1.7–2.4)

## 2020-12-04 MED ORDER — ASCORBIC ACID 500 MG PO TABS
500.0000 mg | ORAL_TABLET | Freq: Two times a day (BID) | ORAL | Status: DC
  Administered 2020-12-05 – 2020-12-06 (×4): 500 mg via ORAL
  Filled 2020-12-04 (×4): qty 1

## 2020-12-04 MED ORDER — EPOETIN ALFA 10000 UNIT/ML IJ SOLN
4000.0000 [IU] | Freq: Once | INTRAMUSCULAR | Status: AC
  Administered 2020-12-04: 4000 [IU] via INTRAVENOUS

## 2020-12-04 MED ORDER — ADULT MULTIVITAMIN W/MINERALS CH
1.0000 | ORAL_TABLET | Freq: Every day | ORAL | Status: DC
  Administered 2020-12-06: 1 via ORAL
  Filled 2020-12-04: qty 1

## 2020-12-04 MED ORDER — RENA-VITE PO TABS
1.0000 | ORAL_TABLET | Freq: Every day | ORAL | Status: DC
  Administered 2020-12-05 (×2): 1 via ORAL
  Filled 2020-12-04 (×3): qty 1

## 2020-12-04 MED ORDER — NEPRO/CARBSTEADY PO LIQD
237.0000 mL | Freq: Three times a day (TID) | ORAL | Status: DC
  Administered 2020-12-04 – 2020-12-07 (×4): 237 mL via ORAL

## 2020-12-04 NOTE — Plan of Care (Addendum)
Consult noted. In to see patient. She is complaining of being uncomfortable and is asking for help. Her IV pump is beeping. Patient is distracted. Per staff waiting for a new bed as her current one is broken. Will reattempt GOC tomorrow.

## 2020-12-04 NOTE — Progress Notes (Signed)
Dr. Holley Raring made aware of decreased blood pressure at this time. BP to be repeated after cuff adjustment. UF off at this time, UF goal reduced to 1013ml at this time. Will continue to montor.

## 2020-12-04 NOTE — Progress Notes (Signed)
Initial Nutrition Assessment  DOCUMENTATION CODES:   Morbid obesity  INTERVENTION:   Nepro Shake po TID, each supplement provides 425 kcal and 19 grams protein  Vitamin C 537m po BID  MVI po daily   Rena-vit po daily  Carbohydrate modified diet   NUTRITION DIAGNOSIS:   Increased nutrient needs related to wound healing as evidenced by estimated needs.  GOAL:   Patient will meet greater than or equal to 90% of their needs  MONITOR:   PO intake, Supplement acceptance, Weight trends, Labs, Skin, I & O's  REASON FOR ASSESSMENT:   LOS    ASSESSMENT:   69y.o. 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 Pt s/p new HD 11/30.   Met with pt in room today. Pt reports poor appetite and oral intake in hospital but reports that she was eating fairly well at home pta. Pt documented to be eating only sips and bites of meals; pt reports eating a few bites of eggs for breakfast this morning but reports that she did drink some Nepro. RD discussed with pt the importance of adequate nutrition needed for wound healing. Pt reports that she is willing to drink Nepro supplements in hospital. RD will add supplements and vitamins to support wound healing and replace losses from HD. VAC in place. It is difficult to determine if pt has had any significant weight changes as pt is bedbound and bed weights appear incorrect.   Medications reviewed and include: plavix, insulin, miralax  Labs reviewed: Na 133(L), BUN 45(H), creat 3.55(H), P 4.0 wnl, Mg 1.8 wnl Hgb 8.7(L), Hct 28.8(L) cbgs- 142, 190 x 24 hrs AIC 7.1(H)- 11/29  NUTRITION - FOCUSED PHYSICAL EXAM:    Most Recent Value  Orbital Region No depletion  Upper Arm Region No depletion  Thoracic and  Lumbar Region No depletion  Buccal Region No depletion  Temple Region No depletion  Clavicle Bone Region No depletion  Clavicle and Acromion Bone Region No depletion  Scapular Bone Region No depletion  Dorsal Hand No depletion  Patellar Region Mild depletion  Anterior Thigh Region Mild depletion  Posterior Calf Region Mild depletion  Edema (RD Assessment) Mild  Hair Reviewed  Eyes Reviewed  Mouth Reviewed  Skin Reviewed  Nails Reviewed     Diet Order:   Diet Order            Diet NPO time specified Except for: Sips with Meds  Diet effective midnight           Diet Heart Room service appropriate? Yes; Fluid consistency: Thin  Diet effective now                EDUCATION NEEDS:   Education needs have been addressed  Skin:  Skin Assessment: Reviewed RN Assessment (Stage III buttocks, UPI R heel, incision thigh s/p L AKA)  Last BM:  12/6- type 7  Height:   Ht Readings from Last 1 Encounters:  11/24/20 _0  (1.702 m)    Weight:   Wt Readings from Last 1 Encounters:  12/04/20 123.8 kg    Ideal Body Weight:  61.36 kg  BMI:  Body mass index is 42.76 kg/m.  Estimated Nutritional Needs:   Kcal:  2300-2600kcal/day  Protein:  >115g/day  Fluid:  1.8L/day  CKoleen DistanceMS,  RD, LDN Please refer to [facility] for RD and/or RD on-call/weekend/after hours pager

## 2020-12-04 NOTE — Progress Notes (Signed)
[facility]  Daily Progress Note   Subjective  -   Doing well.  Pain control is fairly good.  No major events overnight.  Objective Vitals:   12/03/20 1955 12/04/20 0340 12/04/20 0437 12/04/20 0734  BP: 117/76 117/83 129/80 112/60  Pulse: (!) 123 (!) 116 (!) 130 (!) 110  Resp:      Temp: 98.4 F (36.9 C) 98.5 F (36.9 C) 98.2 F (36.8 C) 98.2 F (36.8 C)  TempSrc: Oral Oral Oral Axillary  SpO2: 97% 100% 100% 100%  Weight:   123.8 kg   Height:        Intake/Output Summary (Last 24 hours) at 12/04/2020 0912 Last data filed at 12/04/2020 0138 Gross per 24 hour  Intake 0 ml  Output 0 ml  Net 0 ml    PULM  CTAB CV  tachycardic VASC  stump is clean, dry, and intact with incisional VAC in place.  Laboratory CBC    Component Value Date/Time   WBC 8.6 12/03/2020 0543   HGB 8.7 (L) 12/03/2020 0543   HCT 28.8 (L) 12/03/2020 0543   PLT 221 12/03/2020 0543    BMET    Component Value Date/Time   NA 133 (L) 12/03/2020 0543   K 4.2 12/03/2020 0543   CL 96 (L) 12/03/2020 0543   CO2 25 12/03/2020 0543   GLUCOSE 139 (H) 12/03/2020 0543   BUN 45 (H) 12/03/2020 0543   CREATININE 3.55 (H) 12/03/2020 0543   CALCIUM 7.8 (L) 12/03/2020 0543   GFRNONAA 13 (L) 12/03/2020 0543   GFRAA >60 09/13/2020 0539    Assessment/Planning: POD #4 s/p left AKA   Doing fairly well  Incisional VAC can be removed today or tomorrow and then replaced with a Xeroform and gauze dressing.  If this has significant drainage, an incisional VAC can be replaced.  No other recs from vascular standpoint at this time    Leotis Pain  12/04/2020, 9:12 AM

## 2020-12-04 NOTE — Progress Notes (Signed)
HR noted to be continuously elevated with patient stating she tends to run higher. Dr. Holley Raring made aware with no new orders at this time. Patient asymptomatic. Will continue to monitor.

## 2020-12-04 NOTE — Progress Notes (Signed)
Progress Note  Patient Name: Brooke Hopkins Date of Encounter: 12/04/2020  Primary Cardiologist: Garen Lah  Subjective   Status post left AKA on 12/2. She redeveloped Afib with RVR on 12/2 at 19:15 and remains in Afib with RVR this morning with ventricular rates in the 120-130 bpm range.  She remains on IV amiodarone and Lopressor for rate control. Relative hypotension has precluded escalation of beta blocker. No chest pain, dyspnea, palpitations, dizziness, or presyncope.    Inpatient Medications    Scheduled Meds: . apixaban  5 mg Oral BID  . Chlorhexidine Gluconate Cloth  6 each Topical Q0600  . Chlorhexidine Gluconate Cloth  6 each Topical Once  . clopidogrel  75 mg Oral Daily  . collagenase   Topical Daily  . feeding supplement (NEPRO CARB STEADY)  237 mL Oral BID BM  . insulin aspart  0-6 Units Subcutaneous TID WC  . metoprolol tartrate  25 mg Oral BID  . polyethylene glycol  17 g Oral Daily   Continuous Infusions: . sodium chloride 250 mL (12/01/20 2213)  . amiodarone 60 mg/hr (12/04/20 0940)   PRN Meds: sodium chloride, acetaminophen, HYDROcodone-acetaminophen, ondansetron (ZOFRAN) IV   Vital Signs    Vitals:   12/03/20 1955 12/04/20 0340 12/04/20 0437 12/04/20 0734  BP: 117/76 117/83 129/80 112/60  Pulse: (!) 123 (!) 116 (!) 130 (!) 110  Resp:      Temp: 98.4 F (36.9 C) 98.5 F (36.9 C) 98.2 F (36.8 C) 98.2 F (36.8 C)  TempSrc: Oral Oral Oral Axillary  SpO2: 97% 100% 100% 100%  Weight:   123.8 kg   Height:        Intake/Output Summary (Last 24 hours) at 12/04/2020 1023 Last data filed at 12/04/2020 0138 Gross per 24 hour  Intake 0 ml  Output 0 ml  Net 0 ml   Filed Weights   12/02/20 0423 12/03/20 0318 12/04/20 0437  Weight: 133.8 kg 123.8 kg 123.8 kg    Telemetry    Afib with RVR redeveloped at 19:15 on 12/2 with ventricular rates in the 120s to 130s bpm - Personally Reviewed  ECG    Afib with RVR, 149 bpm, nonspecific st/t  changes - Personally Reviewed  Physical Exam   GEN: No acute distress.   Neck: JVD difficult to assess secondary to body habitus. Cardiac: Tachycardic, IRIR, I/VI systolic murmur, no rubs, or gallops.  Respiratory: Diminished breath sounds to anterior exam.  GI: Soft, nontender, non-distended.   MS: Status post left AKA. Neuro:  Alert and oriented x 3; Nonfocal.  Psych: Normal affect.  Labs    Chemistry Recent Labs  Lab 11/28/20 0559 11/30/20 0605 11/30/20 2350 12/02/20 0607 12/03/20 0543  NA 136   < > 137 134* 133*  K 3.9   < > 4.2 3.9 4.2  CL 98   < > 101 96* 96*  CO2 23   < > 24 27 25   GLUCOSE 125*   < > 136* 140* 139*  BUN 83*   < > 56* 38* 45*  CREATININE 5.61*   < > 4.42* 3.14* 3.55*  CALCIUM 7.4*   < > 8.0* 7.8* 7.8*  PROT 5.5*  --   --   --   --   ALBUMIN 2.1*  --   --   --   --   AST 56*  --   --   --   --   ALT 161*  --   --   --   --  ALKPHOS 79  --   --   --   --   BILITOT 0.6  --   --   --   --   GFRNONAA 8*   < > 10* 15* 13*  ANIONGAP 15   < > 12 11 12    < > = values in this interval not displayed.     Hematology Recent Labs  Lab 12/01/20 0520 12/02/20 0607 12/03/20 0543  WBC 9.3 10.1 8.6  RBC 3.57* 3.28* 3.55*  HGB 8.8* 8.2* 8.7*  HCT 28.4* 26.3* 28.8*  MCV 79.6* 80.2 81.1  MCH 24.6* 25.0* 24.5*  MCHC 31.0 31.2 30.2  RDW 17.0* 17.2* 16.9*  PLT 203 209 221    Cardiac EnzymesNo results for input(s): TROPONINI in the last 168 hours. No results for input(s): TROPIPOC in the last 168 hours.   BNPNo results for input(s): BNP, PROBNP in the last 168 hours.   DDimer No results for input(s): DDIMER in the last 168 hours.   Radiology    DG Knee 1-2 Views Right  Result Date: 11/29/2020 IMPRESSION: Negative for fracture.  Tricompartmental degenerative change. Electronically Signed   By: Franchot Gallo M.D.   On: 11/29/2020 10:28    Cardiac Studies   2D echo 11/25/2020: 1. Left ventricular ejection fraction, by estimation, is 30 to 35%.  The  left ventricle has moderately decreased function. The left ventricle  demonstrates global hypokinesis. Unable to exclude regional wall motion  abnormality, select images with  hypokinesis of the anterior and anteroseptal wall though not well  visualized. Left ventricular diastolic parameters are indeterminate.  2. Right ventricular systolic function is normal. The right ventricular  size is normal. There is mildly elevated pulmonary artery systolic  pressure. The estimated right ventricular systolic pressure is 01.0 mmHg.  3. Left atrial size was moderately dilated.  4. Right atrial size was moderately dilated.  5. Tricuspid valve regurgitation is moderate.  6. Mild mitral valve regurgitation.  7. A small pericardial effusion is present.  8. Rhythm is atrial fibrillation with RVR  Patient Profile     69 y.o. female with history of CVA, PAD s/p left popliteal and AT stenting, CKD stage III, chronic lymphedema, decubitus ulcers, DM2, HTN, and obesity who we are seeing of Afib with RVR and hypotension requiring emergent DCCV.  Assessment & Plan    1. Afib with RVR: -She presented on 11/26 and required emergent DCCV in the ED secondary to hypotension. She redeveloped Afib with RVR and was placed on amiodarone gtt with subsequent a repeat DCCV on 11/29 -Throughout this admission, medication options have been limited secondary to hypotension -She redeveloped Afib with RVR on 12/2 at 19:15 with ventricular rates in the 120s to 140s bpm and remains in Afib with RVR at this time with ventricular rates in the 120-130 bpm range -Overall, this is a difficult situation given recurrence of Afib and limited rate controlling options with relative hypotension precluding beta blocker or calcium channel blocker as well as ESRD precluding digoxin -Continue Lopressor at 25 mg bid with holding parameters for rate control -Continue amiodarone gtt for rate control -She did eat breakfast this  morning, consisting of some eggs and protein shake around 0930 -NPO at midnight -Plan for DCCV 12/7 with Dr. Saunders Revel -CHADS2VASc at least 7 -Resumed on Eliquis 12/2 by vascular surgery    2. Elevated troponin: -She denies any chest pain -Mildly elevated and felt to be in the setting of Afib with RVR -Echo this admission with  an EF of 35%, possibly stress or tachy-mediated -Outpatient follow up  3. Cardiomyopathy: -Uncertain etiology -Cannot exclude stress vs tachy-mediated vs ischemic -Denies active angina -Will need outpatient ischemic evaluation -Continue Lopressor for now as above for rate control with consideration to transition to Toprol XL or Coreg if possible -Not currently on ACEi/ARB/Entresto/MRA secondary to AKI and hypotension   4. Osteomyelitis: -Status post left AKA 12/2 -Postoperative stress likely contributing to recurrence of Afib -Per primary and vascular surgery   5. Acute on CKD stage III: -Per IM -Perm-Cath can be placed 12/7 following DCCV if needed  6. Anemia: -Likely mixed in etiology with chronic disease and ABL from surgery -Low thought stable, monitor on OAC/Plavix -Per IM  7. PAD: -Followed by VVS -Plavix resumed by vascular surgery   For questions or updates, please contact [facility] Please consult [email or web address] for contact info under Cardiology/STEMI.    Signed, Christell Faith, PA-C [place] Pager: [phone] 12/04/2020, 10:23 AM

## 2020-12-04 NOTE — Progress Notes (Signed)
Pt taken via bed to hemodialysis at this time.

## 2020-12-04 NOTE — Progress Notes (Signed)
Primary RN, Linus Orn, made aware of patient pain at this time and request for pain medication. RN to the dialysis unit and pain medication given. Pain rated at 8 out of 10. Will continue to montor.

## 2020-12-04 NOTE — Progress Notes (Signed)
[facility]  ROUNDING NOTE   Subjective:   Patient lying in bed, denies SOB, palpitations or any distress.Patient is on cardiac monitor, Afib with RVR, with HR in 110's t 120's. She is on Amiodarone infusion.   We are planning to do dialysis treatment later today.   Objective:  Vital signs in last 24 hours:  Temp:  [98.2 F (36.8 C)-98.5 F (36.9 C)] 98.2 F (36.8 C) (12/06 1646) Pulse Rate:  [110-130] 110 (12/06 1646) Resp:  [20] 20 (12/06 1646) BP: (100-133)/(59-98) 117/76 (12/06 1646) SpO2:  [97 %-100 %] 98 % (12/06 1646) Weight:  [123.8 kg] 123.8 kg (12/06 0437)  Weight change: 0 kg Filed Weights   12/02/20 0423 12/03/20 0318 12/04/20 0437  Weight: 133.8 kg 123.8 kg 123.8 kg    Intake/Output: No intake/output data recorded.   Intake/Output this shift:  No intake/output data recorded.  Physical Exam: General:  In no acute distress  Head:  Normocephalic,atraumatic  Eyes:  Sclerae and conjunctivae clear  Lungs:    Respirations even, unlabored, lungs diminished at the bases  Heart:  Irregular, tachycardic in 110's to 120's   Abdomen:  Soft, nontender, obese  Extremities: 2+ peripheral edema. Left AKA   Neurologic:  oriented, speech clear and appropriate  Skin:  left AKA surgical site with dressing clean dry and intact   Access Lt IJ Temporary catheter    Basic Metabolic Panel: Recent Labs  Lab 11/28/20 0559 11/28/20 0559 11/30/20 0605 11/30/20 0605 11/30/20 2350 12/01/20 0520 12/02/20 0607 12/03/20 0543 12/04/20 0638 12/04/20 0918  NA 136  --  137  --  137  --  134* 133*  --   --   K 3.9  --  3.8  --  4.2  --  3.9 4.2  --   --   CL 98  --  100  --  101  --  96* 96*  --   --   CO2 23  --  26  --  24  --  27 25  --   --   GLUCOSE 125*  --  92  --  136*  --  140* 139*  --   --   BUN 83*  --  55*  --  56*  --  38* 45*  --   --   CREATININE 5.61*  --  4.27*  --  4.42*  --  3.14* 3.55*  --   --   CALCIUM 7.4*   < > 7.5*   < > 8.0*  --  7.8*  7.8*  --   --   MG  --   --  1.5*  --   --  1.9 1.8  --  1.8  --   PHOS  --   --   --   --   --   --   --   --   --  4.0   < > = values in this interval not displayed.    Liver Function Tests: Recent Labs  Lab 11/28/20 0559  AST 56*  ALT 161*  ALKPHOS 79  BILITOT 0.6  PROT 5.5*  ALBUMIN 2.1*   No results for input(s): LIPASE, AMYLASE in the last 168 hours. No results for input(s): AMMONIA in the last 168 hours.  CBC: Recent Labs  Lab 11/28/20 0559 11/28/20 0559 11/30/20 0605 11/30/20 2350 12/01/20 0520 12/02/20 0607 12/03/20 0543  WBC 13.8*   < > 10.4 11.5* 9.3 10.1 8.6  NEUTROABS 10.8*  --   --   --   --   --   --  HGB 8.6*   < > 8.9* 9.5* 8.8* 8.2* 8.7*  HCT 27.8*   < > 29.0* 30.3* 28.4* 26.3* 28.8*  MCV 79.9*   < > 79.0* 79.5* 79.6* 80.2 81.1  PLT 196   < > 191 202 203 209 221   < > = values in this interval not displayed.    Cardiac Enzymes: No results for input(s): CKTOTAL, CKMB, CKMBINDEX, TROPONINI in the last 168 hours.  BNP: Invalid input(s): POCBNP  CBG: Recent Labs  Lab 12/03/20 1648 12/03/20 2111 12/04/20 0734 12/04/20 1215 12/04/20 1653  GLUCAP 124* 194* 142* 190* 256*    Microbiology: Results for orders placed or performed during the hospital encounter of 11/24/20  Resp Panel by RT-PCR (Flu A&B, Covid) Nasopharyngeal Swab     Status: None   Collection Time: 11/24/20  9:35 AM   Specimen: Nasopharyngeal Swab; Nasopharyngeal(NP) swabs in vial transport medium  Result Value Ref Range Status   SARS Coronavirus 2 by RT PCR NEGATIVE NEGATIVE Final    Comment: (NOTE) SARS-CoV-2 target nucleic acids are NOT DETECTED.  The SARS-CoV-2 RNA is generally detectable in upper respiratory specimens during the acute phase of infection. The lowest concentration of SARS-CoV-2 viral copies this assay can detect is 138 copies/mL. A negative result does not preclude SARS-Cov-2 infection and should not be used as the sole basis for treatment or other  patient management decisions. A negative result may occur with  improper specimen collection/handling, submission of specimen other than nasopharyngeal swab, presence of viral mutation(s) within the areas targeted by this assay, and inadequate number of viral copies(<138 copies/mL). A negative result must be combined with clinical observations, patient history, and epidemiological information. The expected result is Negative.  Fact Sheet for Patients:  EntrepreneurPulse.com.au  Fact Sheet for Healthcare Providers:  IncredibleEmployment.be  This test is no t yet approved or cleared by the [place] FDA and  has been authorized for detection and/or diagnosis of SARS-CoV-2 by FDA under an Emergency Use Authorization (EUA). This EUA will remain  in effect (meaning this test can be used) for the duration of the COVID-19 declaration under Section 564(b)(1) of the Act, 21 U.S.C.section 360bbb-3(b)(1), unless the authorization is terminated  or revoked sooner.       Influenza A by PCR NEGATIVE NEGATIVE Final   Influenza B by PCR NEGATIVE NEGATIVE Final    Comment: (NOTE) The Xpert Xpress SARS-CoV-2/FLU/RSV plus assay is intended as an aid in the diagnosis of influenza from Nasopharyngeal swab specimens and should not be used as a sole basis for treatment. Nasal washings and aspirates are unacceptable for Xpert Xpress SARS-CoV-2/FLU/RSV testing.  Fact Sheet for Patients: EntrepreneurPulse.com.au  Fact Sheet for Healthcare Providers: IncredibleEmployment.be  This test is not yet approved or cleared by the [place] FDA and has been authorized for detection and/or diagnosis of SARS-CoV-2 by FDA under an Emergency Use Authorization (EUA). This EUA will remain in effect (meaning this test can be used) for the duration of the COVID-19 declaration under Section 564(b)(1) of the Act, 21 U.S.C. section  360bbb-3(b)(1), unless the authorization is terminated or revoked.  Performed at [facility], [address] [place], [place] [address]     Coagulation Studies: No results for input(s): LABPROT, INR in the last 72 hours.  Urinalysis: No results for input(s): COLORURINE, LABSPEC, PHURINE, GLUCOSEU, HGBUR, BILIRUBINUR, KETONESUR, PROTEINUR, UROBILINOGEN, NITRITE, LEUKOCYTESUR in the last 72 hours.  Invalid input(s): APPERANCEUR    Imaging: No results found.   Medications:   .  sodium chloride 250 mL (12/01/20 2213)  . amiodarone 60 mg/hr (12/04/20 1613)   . apixaban  5 mg Oral BID  . vitamin C  500 mg Oral BID  . Chlorhexidine Gluconate Cloth  6 each Topical Q0600  . Chlorhexidine Gluconate Cloth  6 each Topical Once  . clopidogrel  75 mg Oral Daily  . collagenase   Topical Daily  . feeding supplement (NEPRO CARB STEADY)  237 mL Oral TID BM  . insulin aspart  0-6 Units Subcutaneous TID WC  . metoprolol tartrate  25 mg Oral BID  . multivitamin  1 tablet Oral QHS  . [START ON 12/05/2020] multivitamin with minerals  1 tablet Oral Daily  . polyethylene glycol  17 g Oral Daily   sodium chloride, acetaminophen, HYDROcodone-acetaminophen, ondansetron (ZOFRAN) IV  Assessment/ Plan:  Brooke Hopkins is a 69 y.o. white female with hypertension, diabetes mellitus type II insulin dependent, peripheral arterial disease, CVA, lymphedema, chronic lower extremity wounds, bed bound who was admitted to [facility] on 11/24/2020 for New onset atrial fibrillation ([place]) [I48.91] Elevated troponin [R77.8] Atrial fibrillation with rapid ventricular response ([place]) [I48.91] Acute renal failure, unspecified acute renal failure type ([place]) [N17.9]  # Acute renal failure with metabolic acidosis on chronic kidney disease stage IIIA:  Baseline creatinine of 1.03, GFR of 55 on 09/13/2020  History of proteinuria, hematuria and glycosuria.  Acute renal failure secondary to poor PO intake,  nausea, vomiting, acute cardiorenal syndrome and suspect ATN.   We are planning dialysis treatment later today Permcath placement possibly tomorrow Will consult vascular team NPO post midnight tonight Outpatient dialysis arrangements at [facility] when patient medically stable and able to sit in a chair  #Anemia with renal failure Lab Results  Component Value Date   HGB 8.7 (L) 12/03/2020  Will consider Epogen with dialysis treatments   #Diabetes mellitus type II with CKD   Lab Results  Component Value Date   HGBA1C 7.1 (H) 11/27/2020   Blood glucose readings within acceptable range  #Left heel osteomyelitis Left AKA on 11/30/2020 Pain controlled, dressing on surgical site is clean, dry and intact Management per vascular surgery team  # Atrial Fibrillation with RVR On Amiodarone infusion  HR 110's to 120's Management per Cardiology team    LOS: [place] 12/6/20214:56 PM

## 2020-12-04 NOTE — Progress Notes (Signed)
Patient accepted at [facility] 6:15am. Patient still needs to sit in a chair for dialysis. Please contact me with patient is medically ready for discharge so that a start date can be coordinated with clinic.  Elvera Bicker Dialysis Coordinator [phone]

## 2020-12-04 NOTE — Progress Notes (Signed)
[facility] [place] NAME: Brooke Hopkins    MR#:  [id number]  DATE OF BIRTH:  [birth year]  SUBJECTIVE:   Son in the room. Patient denies any complaints. Ate  breakfast earlier. No other issues per RN. Continues to remain on amiodarone with heart rate jumping around 110 to 120s  Plans for CV cancelled since orders were not placed by cardiology yday and pt ate BF REVIEW OF SYSTEMS:   Review of Systems  Constitutional: Negative for chills, fever and weight loss.  HENT: Negative for ear discharge, ear pain and nosebleeds.   Eyes: Negative for blurred vision, pain and discharge.  Respiratory: Negative for sputum production, shortness of breath, wheezing and stridor.   Cardiovascular: Negative for chest pain, palpitations, orthopnea and PND.  Gastrointestinal: Negative for abdominal pain, diarrhea, nausea and vomiting.  Genitourinary: Negative for frequency and urgency.  Musculoskeletal: Negative for back pain and joint pain.  Neurological: Negative for sensory change, speech change, focal weakness and weakness.  Psychiatric/Behavioral: Negative for depression and hallucinations. The patient is not nervous/anxious.    Tolerating Diet:yesTolerating PT:bed bound status POA   DRUG ALLERGIES:  No Known Allergies  VITALS:  Blood pressure 112/60, pulse (!) 110, temperature 98.2 F (36.8 C), temperature source Axillary, resp. rate 19, height 5\' 7"  (1.702 m), weight 123.8 kg, SpO2 100 %.  PHYSICAL EXAMINATION:   Physical Exam  GENERAL:  69 y.o.-year-old patient lying in the bed with no acute distress. Morbid obesity Chronically ill HEENT: Head atraumatic, normocephalic. Oropharynx and nasopharynx clear.  NECK:  Supple, no jugular venous distention. No thyroid enlargement, no tenderness.  LUNGS: Normal breath sounds bilaterally, no wheezing, rales, rhonchi. No use of accessory muscles of respiration.  CARDIOVASCULAR: S1, S2 normal. No murmurs,  rubs, or gallops. Tachycardia irregularly irregular rhythm ABDOMEN: Soft, nontender, nondistended. Bowel sounds present. No organomegaly or mass.  EXTREMITIES: left aka. covered with surgical dressing/vac+ NEUROLOGIC: weak, grossly non focal PSYCHIATRIC:  patient is alert and awake.   SKIN: chronic decubitus  as below.   LABORATORY PANEL:  CBC Recent Labs  Lab 12/03/20 0543  WBC 8.6  HGB 8.7*  HCT 28.8*  PLT 221    Chemistries  Recent Labs  Lab 11/28/20 0559 11/30/20 0605 12/02/20 0607 12/03/20 0543 12/04/20 0638  NA 136   < >  --  133*  --   K 3.9   < >  --  4.2  --   CL 98   < >  --  96*  --   CO2 23   < >  --  25  --   GLUCOSE 125*   < >  --  139*  --   BUN 83*   < >  --  45*  --   CREATININE 5.61*   < >  --  3.55*  --   CALCIUM 7.4*   < >  --  7.8*  --   MG  --    < >   < >  --  1.8  AST 56*  --   --   --   --   ALT 161*  --   --   --   --   ALKPHOS 79  --   --   --   --   BILITOT 0.6  --   --   --   --    < > = values in this interval not displayed.   Cardiac Enzymes  No results for input(s): TROPONINI in the last 168 hours. RADIOLOGY:  No results found. ASSESSMENT AND PLAN:  SAHILY Hopkins is a 69 y.o. 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  Atrial fibrillation with rapid ventricular response.   Atrial fibrillation is paroxysmal in nature. --Patient on Eliquis for anticoagulation.  --Patient was placed back on amiodarone drip after left AKA.   --Continue low-dose metoprolol.   --pt's cardioversion on Monday was cancelled due to no orders received from [facility] cardiology for the procedure and pt ate BF.  --plans for CV for tomorrow  Left heel osteomyelitis with full-thickness ulceration and necrosis of  muscle and leukocytosis.  --s/p  Left AKA done on 11/30/2020.  Discontinue antibiotics.  Acute renal failure on chronic kidney disease stage IIIa.  -- Creatinine increased on a daily basis with a peak of 5.61 on 11/28/2020. --Baseline creatinine 1.03. --  Patient had 3 dialysis sessions here last being yesterday.   --Will need a PermCath in an outpatient dialysis slot prior to disposition--likley Tuesday or wednesday -- currently patient has left IJ temporary catheter  Acquired thrombophilia secondary to atrial fibrillation.  -- Eliquis to reduce stroke risk.  Stage III decubitus ulcer on buttock.  Present on admission.  See description below  Cirrhosis of liver seen on previous imaging study with elevated liver function test.  -- monitor closely while on amiodarone and anticoagulation  Bedbound status  Right knee pain.  X-ray showing degenerative changes  Morbid obesity.   Anemia.  Hemoglobin dipped down to 8.2  Pressure Injury 11/26/20 Buttocks Left Stage 3 -  Full thickness tissue loss. Subcutaneous fat may be visible but bone, tendon or muscle are NOT exposed. (Active)  11/26/20 1313  Location: Buttocks  Location Orientation: Left  Staging: Stage 3 -  Full thickness tissue loss. Subcutaneous fat may be visible but bone, tendon or muscle are NOT exposed.  Wound Description (Comments):   Present on Admission:      Pressure Injury 11/26/20 Heel Right Unstageable - Full thickness tissue loss in which the base of the injury is covered by slough (yellow, tan, gray, green or brown) and/or eschar (tan, brown or black) in the wound bed. (Active)  11/26/20 1314  Location: Heel  Location Orientation: Right  Staging: Unstageable - Full thickness tissue loss in which the base of the injury is covered by slough (yellow, tan, gray, green or brown) and/or eschar (tan, brown or black) in the wound bed.  Wound Description (Comments):   Present on Admission:    palliative care consultation  placed. Patient has multiple comorbidities. Long-term has poor prognosis     Procedures: IJ temporary catheter, status post amputation left aka. Family communication : son in the room Consults : cardiology, nephrology CODE STATUS: DNR prior to admission DVT Prophylaxis : eliquis  Status is: Inpatient  Remains inpatient appropriate because:IV treatments appropriate due to intensity of illness or inability to take PO   Dispo: The patient is from: Home              Anticipated d/c is to: Home              Anticipated d/c date is: 3 days              Patient currently  is not medically stable to d/c.  Continues to remain on amiodarone drip. Heart rate is still in the 110s. Plans for cardioversion likely tomorrow (Tuesday) now. She will need permanent dialysis catheter which likely is to be placed on Tuesday or wednesday.      TOTAL TIME TAKING CARE OF THIS PATIENT: 25 minutes.  >50% time spent on counselling and coordination of care  Note: This dictation was prepared with Dragon dictation along with smaller phrase technology. Any transcriptional errors that result from this process are unintentional.  Fritzi Mandes M.D    [facility]   CC: Primary care physician; Lynnell Jude, MDPatient ID: [name], female   DOB: September 19, 1951, 69 y.o.   MRN: [id number]

## 2020-12-05 ENCOUNTER — Inpatient Hospital Stay: Payer: Medicare HMO | Admitting: Anesthesiology

## 2020-12-05 ENCOUNTER — Encounter
Admission: EM | Disposition: A | Discharge status: Hospice | Payer: Self-pay | Source: Home / Self Care | Attending: Internal Medicine

## 2020-12-05 ENCOUNTER — Inpatient Hospital Stay (HOSPITAL_COMMUNITY)
Admit: 2020-12-05 | Discharge: 2020-12-05 | Disposition: A | Discharge status: Home / Self Care | Payer: Medicare HMO | Attending: Internal Medicine | Admitting: Internal Medicine

## 2020-12-05 ENCOUNTER — Encounter: Payer: Self-pay | Admitting: Internal Medicine

## 2020-12-05 DIAGNOSIS — I340 Nonrheumatic mitral (valve) insufficiency: Secondary | ICD-10-CM

## 2020-12-05 DIAGNOSIS — I4891 Unspecified atrial fibrillation: Secondary | ICD-10-CM | POA: Diagnosis not present

## 2020-12-05 DIAGNOSIS — I361 Nonrheumatic tricuspid (valve) insufficiency: Secondary | ICD-10-CM

## 2020-12-05 DIAGNOSIS — I429 Cardiomyopathy, unspecified: Secondary | ICD-10-CM | POA: Diagnosis not present

## 2020-12-05 DIAGNOSIS — I313 Pericardial effusion (noninflammatory): Secondary | ICD-10-CM

## 2020-12-05 HISTORY — PX: CARDIOVERSION: SHX1299

## 2020-12-05 LAB — CBC
HCT: 27.1 % — ABNORMAL LOW (ref 36.0–46.0)
Hemoglobin: 8.3 g/dL — ABNORMAL LOW (ref 12.0–15.0)
MCH: 24.5 pg — ABNORMAL LOW (ref 26.0–34.0)
MCHC: 30.6 g/dL (ref 30.0–36.0)
MCV: 79.9 fL — ABNORMAL LOW (ref 80.0–100.0)
Platelets: 232 10*3/uL (ref 150–400)
RBC: 3.39 MIL/uL — ABNORMAL LOW (ref 3.87–5.11)
RDW: 16.6 % — ABNORMAL HIGH (ref 11.5–15.5)
WBC: 10.3 10*3/uL (ref 4.0–10.5)
nRBC: 0 % (ref 0.0–0.2)

## 2020-12-05 LAB — SURGICAL PATHOLOGY

## 2020-12-05 LAB — BASIC METABOLIC PANEL
Anion gap: 10 (ref 5–15)
BUN: 34 mg/dL — ABNORMAL HIGH (ref 8–23)
CO2: 27 mmol/L (ref 22–32)
Calcium: 7.6 mg/dL — ABNORMAL LOW (ref 8.9–10.3)
Chloride: 95 mmol/L — ABNORMAL LOW (ref 98–111)
Creatinine, Ser: 2.87 mg/dL — ABNORMAL HIGH (ref 0.44–1.00)
GFR, Estimated: 17 mL/min — ABNORMAL LOW (ref >60)
Glucose, Bld: 180 mg/dL — ABNORMAL HIGH (ref 70–99)
Potassium: 3.3 mmol/L — ABNORMAL LOW (ref 3.5–5.1)
Sodium: 132 mmol/L — ABNORMAL LOW (ref 135–145)

## 2020-12-05 LAB — PROTIME-INR
INR: 2 — ABNORMAL HIGH (ref 0.8–1.2)
Prothrombin Time: 21.9 seconds — ABNORMAL HIGH (ref 11.4–15.2)

## 2020-12-05 LAB — GLUCOSE, CAPILLARY
Glucose-Capillary: 108 mg/dL — ABNORMAL HIGH (ref 70–99)
Glucose-Capillary: 129 mg/dL — ABNORMAL HIGH (ref 70–99)
Glucose-Capillary: 142 mg/dL — ABNORMAL HIGH (ref 70–99)
Glucose-Capillary: 142 mg/dL — ABNORMAL HIGH (ref 70–99)
Glucose-Capillary: 147 mg/dL — ABNORMAL HIGH (ref 70–99)

## 2020-12-05 SURGERY — CARDIOVERSION
Anesthesia: General

## 2020-12-05 MED ORDER — ALBUMIN HUMAN 25 % IV SOLN
25.0000 g | Freq: Once | INTRAVENOUS | Status: AC
  Administered 2020-12-05: 25 g via INTRAVENOUS

## 2020-12-05 MED ORDER — METOPROLOL TARTRATE 25 MG PO TABS
37.5000 mg | ORAL_TABLET | Freq: Two times a day (BID) | ORAL | Status: DC
  Administered 2020-12-05 – 2020-12-06 (×2): 37.5 mg via ORAL
  Filled 2020-12-05 (×2): qty 2

## 2020-12-05 MED ORDER — MORPHINE SULFATE (PF) 2 MG/ML IV SOLN
2.0000 mg | Freq: Four times a day (QID) | INTRAVENOUS | Status: DC | PRN
  Administered 2020-12-05 – 2020-12-06 (×4): 2 mg via INTRAVENOUS
  Filled 2020-12-05 (×4): qty 1

## 2020-12-05 MED ORDER — PROPOFOL 10 MG/ML IV BOLUS
INTRAVENOUS | Status: AC
  Filled 2020-12-05: qty 20

## 2020-12-05 MED ORDER — PROPOFOL 10 MG/ML IV BOLUS
INTRAVENOUS | Status: DC | PRN
  Administered 2020-12-05: 20 mg via INTRAVENOUS
  Administered 2020-12-05: 10 mg via INTRAVENOUS

## 2020-12-05 MED ORDER — BUTAMBEN-TETRACAINE-BENZOCAINE 2-2-14 % EX AERO
INHALATION_SPRAY | CUTANEOUS | Status: AC
  Filled 2020-12-05: qty 5

## 2020-12-05 MED ORDER — METHOCARBAMOL 1000 MG/10ML IJ SOLN
500.0000 mg | Freq: Once | INTRAVENOUS | Status: AC
  Administered 2020-12-05: 500 mg via INTRAVENOUS
  Filled 2020-12-05: qty 5

## 2020-12-05 MED ORDER — ACETAMINOPHEN 650 MG RE SUPP
650.0000 mg | RECTAL | Status: DC | PRN
  Filled 2020-12-05: qty 1

## 2020-12-05 MED ORDER — METOPROLOL TARTRATE 5 MG/5ML IV SOLN
5.0000 mg | Freq: Once | INTRAVENOUS | Status: AC
  Administered 2020-12-06: 5 mg via INTRAVENOUS
  Filled 2020-12-05: qty 5

## 2020-12-05 MED ORDER — SODIUM CHLORIDE 0.9 % IV SOLN: INTRAVENOUS | Status: DC

## 2020-12-05 MED ORDER — SODIUM CHLORIDE FLUSH 0.9 % IV SOLN
INTRAVENOUS | Status: AC
  Filled 2020-12-05: qty 10

## 2020-12-05 MED ORDER — LIDOCAINE VISCOUS HCL 2 % MT SOLN
OROMUCOSAL | Status: AC
  Filled 2020-12-05: qty 15

## 2020-12-05 MED ORDER — PHENYLEPHRINE HCL (PRESSORS) 10 MG/ML IV SOLN
INTRAVENOUS | Status: DC | PRN
  Administered 2020-12-05: 100 ug via INTRAVENOUS
  Administered 2020-12-05: 50 ug via INTRAVENOUS
  Administered 2020-12-05: 100 ug via INTRAVENOUS

## 2020-12-05 NOTE — Transfer of Care (Signed)
Immediate Anesthesia Transfer of Care Note  Patient: Brooke Hopkins  Procedure(s) Performed: CARDIOVERSION (N/A )  Patient Location: PACU and Cath Lab  Anesthesia Type:General  Level of Consciousness: awake, alert  and oriented  Airway & Oxygen Therapy: Patient Spontanous Breathing and Patient connected to nasal cannula oxygen  Post-op Assessment: Report given to RN and Post -op Vital signs reviewed and stable  Post vital signs: Reviewed and unstable  Last Vitals:  Vitals Value Taken Time  BP 103/83 12/05/20 1418  Temp    Pulse 108 12/05/20 1421  Resp 22 12/05/20 1421  SpO2 94 % 12/05/20 1421    Last Pain:  Vitals:   12/05/20 1200  TempSrc:   PainSc: 0-No pain      Patients Stated Pain Goal: 0 (44/65/20 7619)  Complications: No complications documented.

## 2020-12-05 NOTE — Progress Notes (Signed)
Pt back to room from dialysis at this time.  HR 140's.  Amio drip noted to be empty.  New Amio bag started.  Pt denies any distress.  PM meds given.

## 2020-12-05 NOTE — Progress Notes (Signed)
Patient ID: XOEY WARMOTH, female   DOB: Mar 18, 1951, 69 y.o.   MRN: [id number]   Per Dr End-- patient was found to have a left atrial mass mobile on TEE. 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 Cardiology recommends discontinuing amiodarone drip. Cardioversion was not carried out given above findings. Blood culture has been ordered rule out endocarditis.

## 2020-12-05 NOTE — Progress Notes (Signed)
Received report from [place] in vascular. Per Jackelyn Poling the patient should be up to the unit around 1500.

## 2020-12-05 NOTE — Progress Notes (Addendum)
   Progress Note  Patient Name: Brooke Hopkins Date of Encounter: 12/05/2020  [facility] Cardiologist: Kate Sable, MD   Transesophageal echocardiogram was performed today with anticipation to proceed with cardioversion.  Mobile pedunculated mass was noted in the left atrium, concerning for thrombus versus myxoma.  Vegetation felt less likely.  Sluggish flow noted in the left atrium is a hole as well as the left atrial appendage.  Severe biventricular dysfunction noted.  Cardioversion could not be safely performed in light of the above findings.  Amiodarone has been discontinued.  Anticoagulation should be continued with apixaban, though I would have a low threshold for transitioning to warfarin with heparin bridge.  I will increase metoprolol to tartrate to 37.5 mg twice daily in an effort to improve ventricular rate control.  Unfortunately, treatment options for atrial fibrillation are limited.  AV node ablation with pacemaker placement is a theoretical option, though I do not believe that Ms. Oblinger is a good candidate for this given her comorbidities and high risk for infection.  I have discussed these findings and recommendations with Ms. Gutierres.  Multiple attempts to contact her husband to review results of TEE and plan moving forward were unsuccessful.  For questions or updates, please contact [place] Please consult [email or web address] for contact info under [facility].     Signed, Nelva Bush, MD  12/05/2020, 6:08 PM

## 2020-12-05 NOTE — Anesthesia Preprocedure Evaluation (Signed)
Anesthesia Evaluation  Patient identified by MRN, date of birth, ID band Patient awake    Reviewed: Allergy & Precautions, H&P , NPO status , Patient's Chart, lab work & pertinent test results  History of Anesthesia Complications Negative for: history of anesthetic complications  Airway Mallampati: III  TM Distance: <3 FB Neck ROM: limited    Dental  (+) Chipped, Poor Dentition, Missing   Pulmonary shortness of breath,    Pulmonary exam normal        Cardiovascular hypertension, + Peripheral Vascular Disease  + dysrhythmias Atrial Fibrillation      Neuro/Psych  Neuromuscular disease CVA, Residual Symptoms negative psych ROS   GI/Hepatic negative GI ROS, Neg liver ROS, neg GERD  ,  Endo/Other  negative endocrine ROSdiabetes, Type 2  Renal/GU DialysisRenal disease  negative genitourinary   Musculoskeletal   Abdominal   Peds  Hematology negative hematology ROS (+)   Anesthesia Other Findings Past Medical History: No date: Chronic kidney disease No date: Diabetes mellitus No date: Hypertension No date: Stroke [facility])  Past Surgical History: No date: ABDOMINAL HYSTERECTOMY 11/30/2020: AMPUTATION; Left     Comment:  Procedure: AMPUTATION ABOVE KNEE;  Surgeon: Algernon Huxley, MD;  Location: [facility] ORS;  Service: General;                Laterality: Left; 72/04/3663: APPLICATION OF WOUND VAC; Left     Comment:  Procedure: APPLICATION OF WOUND VAC;  Surgeon: Algernon Huxley, MD;  Location: [facility] ORS;  Service: General;                Laterality: Left;  GAAC 40347 11/27/2020: CARDIOVERSION; N/A     Comment:  Procedure: CARDIOVERSION;  Surgeon: Wellington Hampshire,               MD;  Location: [facility] ORS;  Service: Cardiovascular;                Laterality: N/A; No date: CHOLECYSTECTOMY No date: HAMMER TOE SURGERY No date: KNEE ARTHROSCOPY 09/01/2020: LOWER EXTREMITY ANGIOGRAPHY; Left     Comment:   Procedure: Lower Extremity Angiography;  Surgeon:               Katha Cabal, MD;  Location: [place] CV LAB;               Service: Cardiovascular;  Laterality: Left; 11/07/11: [place] SURGERY 11/27/2011: LUMBAR WOUND DEBRIDEMENT     Comment:  Procedure: LUMBAR WOUND DEBRIDEMENT;  Surgeon: Eustace Moore;  Location: [place] NEURO ORS;  Service: Neurosurgery;                Laterality: N/A; 11/28/2020: TEMPORARY DIALYSIS CATHETER; N/A     Comment:  Procedure: TEMPORARY DIALYSIS CATHETER;  Surgeon:               Katha Cabal, MD;  Location: [place] CV LAB;               Service: Cardiovascular;  Laterality: N/A; No date: TONSILLECTOMY  BMI    Body Mass Index: 43.71 kg/m      Reproductive/Obstetrics negative OB ROS  Anesthesia Physical Anesthesia Plan  ASA: IV  Anesthesia Plan: General   Post-op Pain Management:    Induction: Intravenous  PONV Risk Score and Plan: Propofol infusion and TIVA  Airway Management Planned: Natural Airway and Nasal Cannula  Additional Equipment:   Intra-op Plan:   Post-operative Plan:   Informed Consent: I have reviewed the patients History and Physical, chart, labs and discussed the procedure including the risks, benefits and alternatives for the proposed anesthesia with the patient or authorized representative who has indicated his/her understanding and acceptance.   Patient has DNR.  Discussed DNR with patient and Suspend DNR.   Dental Advisory Given  Plan Discussed with: Anesthesiologist, CRNA and Surgeon  Anesthesia Plan Comments: (Patient consented for risks of anesthesia including but not limited to:  - adverse reactions to medications - risk of airway placement if required - damage to eyes, teeth, lips or other oral mucosa - nerve damage due to positioning  - sore throat or hoarseness - Damage to heart, brain, nerves, lungs, other parts of  body or loss of life  Patient voiced understanding.)        Anesthesia Quick Evaluation

## 2020-12-05 NOTE — H&P (View-Only) (Signed)
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 She redeveloped Afib with RVR on 12/2 at 19:15 and remains in Afib with RVR this morning with ventricular rates in the 110-130 bpm range.  She remains on IV amiodarone and Lopressor for rate control. Relative hypotension has precluded escalation of beta blocker. No chest pain, dyspnea, palpitations, dizziness, or presyncope. She complains of bilateral leg pain.   Inpatient Medications    Scheduled Meds: . apixaban  5 mg Oral BID  . vitamin C  500 mg Oral BID  . Chlorhexidine Gluconate Cloth  6 each Topical Q0600  . Chlorhexidine Gluconate Cloth  6 each Topical Once  . clopidogrel  75 mg Oral Daily  . collagenase   Topical Daily  . feeding supplement (NEPRO CARB STEADY)  237 mL Oral TID BM  . insulin aspart  0-6 Units Subcutaneous TID WC  . metoprolol tartrate  25 mg Oral BID  . multivitamin  1 tablet Oral QHS  . multivitamin with minerals  1 tablet Oral Daily  . polyethylene glycol  17 g Oral Daily   Continuous Infusions: . sodium chloride 250 mL (12/01/20 2213)  . amiodarone 60 mg/hr (12/05/20 0504)   PRN Meds: sodium chloride, acetaminophen, acetaminophen, HYDROcodone-acetaminophen, ondansetron (ZOFRAN) IV   Vital Signs    Vitals:   12/04/20 2325 12/04/20 2330 12/05/20 0000 12/05/20 0509  BP:  102/66 106/77 (!) 118/98  Pulse: 94 (!) 146 (!) 149 92  Resp: 18 (!) 25 20 17   Temp:   97.8 F (36.6 C) 97.7 F (36.5 C)  TempSrc:   Axillary Oral  SpO2:  100% 100% 100%  Weight:    126.6 kg  Height:        Intake/Output Summary (Last 24 hours) at 12/05/2020 0717 Last data filed at 12/05/2020 0504 Gross per 24 hour  Intake 450 ml  Output 1000 ml  Net -550 ml   Filed Weights   12/03/20 0318 12/04/20 0437 12/05/20 0509  Weight: 123.8 kg 123.8 kg 126.6 kg    Telemetry    Afib with RVR redeveloped at 19:15 on  12/2 with ventricular rates in the 110s to 130s bpm - Personally Reviewed  ECG    Afib with RVR, 149 bpm, nonspecific st/t changes - Personally Reviewed  Physical Exam   GEN: No acute distress.   Neck: JVD difficult to assess secondary to body habitus. Cardiac: Tachycardic, IRIR, I/VI systolic murmur, no rubs, or gallops.  Respiratory: Diminished breath sounds to anterior exam.  GI: Soft, nontender, non-distended.   MS: Status post left AKA. Neuro:  Alert and oriented x 3; Nonfocal.  Psych: Normal affect.  Labs    Chemistry Recent Labs  Lab 11/30/20 2350 12/02/20 0607 12/03/20 0543  NA 137 134* 133*  K 4.2 3.9 4.2  CL 101 96* 96*  CO2 24 27 25   GLUCOSE 136* 140* 139*  BUN 56* 38* 45*  CREATININE 4.42* 3.14* 3.55*  CALCIUM 8.0* 7.8* 7.8*  GFRNONAA 10* 15* 13*  ANIONGAP 12 11 12      Hematology Recent Labs  Lab 12/02/20 0607 12/03/20 0543 12/05/20 0644  WBC 10.1 8.6 10.3  RBC 3.28* 3.55* 3.39*  HGB 8.2* 8.7* 8.3*  HCT 26.3* 28.8* 27.1*  MCV 80.2 81.1 79.9*  MCH 25.0* 24.5* 24.5*  MCHC 31.2 30.2 30.6  RDW 17.2* 16.9* 16.6*  PLT 209 221 232  Cardiac EnzymesNo results for input(s): TROPONINI in the last 168 hours. No results for input(s): TROPIPOC in the last 168 hours.   BNPNo results for input(s): BNP, PROBNP in the last 168 hours.   DDimer No results for input(s): DDIMER in the last 168 hours.   Radiology    DG Knee 1-2 Views Right  Result Date: 11/29/2020 IMPRESSION: Negative for fracture.  Tricompartmental degenerative change. Electronically Signed   By: Franchot Gallo M.D.   On: 11/29/2020 10:28    Cardiac Studies   2D echo 11/25/2020: 1. Left ventricular ejection fraction, by estimation, is 30 to 35%. The  left ventricle has moderately decreased function. The left ventricle  demonstrates global hypokinesis. Unable to exclude regional wall motion  abnormality, select images with  hypokinesis of the anterior and anteroseptal wall though not  well  visualized. Left ventricular diastolic parameters are indeterminate.  2. Right ventricular systolic function is normal. The right ventricular  size is normal. There is mildly elevated pulmonary artery systolic  pressure. The estimated right ventricular systolic pressure is 02.7 mmHg.  3. Left atrial size was moderately dilated.  4. Right atrial size was moderately dilated.  5. Tricuspid valve regurgitation is moderate.  6. Mild mitral valve regurgitation.  7. A small pericardial effusion is present.  8. Rhythm is atrial fibrillation with RVR  Patient Profile     69 y.o. female with history of CVA, PAD s/p left popliteal and AT stenting, CKD stage III, chronic lymphedema, decubitus ulcers, DM2, HTN, and obesity who we are seeing of Afib with RVR and hypotension requiring emergent DCCV.  Assessment & Plan    1. Afib with RVR: -She presented on 11/26 and required emergent DCCV in the ED secondary to hypotension. She redeveloped Afib with RVR and was placed on amiodarone gtt with subsequent a repeat DCCV on 11/29 -Throughout this admission, medication options have been limited secondary to hypotension -She redeveloped Afib with RVR on 12/2 at 19:15 with ventricular rates in the 120s to 140s bpm and remains in Afib with RVR at this time with ventricular rates in the 120-130 bpm range -Overall, this is a difficult situation given recurrence of Afib and limited rate controlling options with relative hypotension precluding beta blocker or calcium channel blocker as well as ESRD precluding digoxin -Continue Lopressor at 25 mg bid with holding parameters for rate control -Continue amiodarone gtt for rate control -NPO  -Will attempt to coordinate TEE-guided DCCV this morning given she had been off anticoagulation for 2.5 days in the perioperative time frame with her leg amputation  -If this cannot be coordinated today with cardiology, echo, and anesthesia, it will need to be deferred  until 12/8 -CHADS2VASc at least 7 -Resumed on Eliquis 12/2 by vascular surgery   -Could consider EP evaluation as well as she may ultimately require AV nodal ablation and PPM, though current timing is not optimal given recent osteomyelitis and initiation of dialysis   2. Elevated troponin: -She denies any chest pain -Mildly elevated and felt to be in the setting of Afib with RVR -Echo this admission with an EF of 35%, possibly stress or tachy-mediated -Outpatient follow up  3. Cardiomyopathy: -Uncertain etiology -Cannot exclude stress vs tachy-mediated vs ischemic -Denies active angina -Will need outpatient ischemic evaluation -Continue Lopressor for now as above for rate control with consideration to transition to Toprol XL or Coreg if possible -Not currently on ACEi/ARB/Entresto/MRA secondary to AKI and hypotension   4. Osteomyelitis: -Status post left AKA 12/2 -Postoperative  stress likely contributing to recurrence of Afib -Per primary and vascular surgery   5. Acute on CKD stage III: -Per IM -Perm-Cath can be placed 12/7 following DCCV if needed  6. Anemia: -Likely mixed in etiology with chronic disease and ABL from surgery -Low thought stable, monitor on OAC/Plavix -Per IM  7. PAD: -Followed by VVS -Plavix resumed by vascular surgery   For questions or updates, please contact [facility] Please consult [email or web address] for contact info under Cardiology/STEMI.    Signed, Christell Faith, PA-C [place] Pager: [phone] 12/05/2020, 7:17 AM

## 2020-12-05 NOTE — Progress Notes (Signed)
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 She redeveloped Afib with RVR on 12/2 at 19:15 and remains in Afib with RVR this morning with ventricular rates in the 110-130 bpm range.  She remains on IV amiodarone and Lopressor for rate control. Relative hypotension has precluded escalation of beta blocker. No chest pain, dyspnea, palpitations, dizziness, or presyncope. She complains of bilateral leg pain.   Inpatient Medications    Scheduled Meds: . apixaban  5 mg Oral BID  . vitamin C  500 mg Oral BID  . Chlorhexidine Gluconate Cloth  6 each Topical Q0600  . Chlorhexidine Gluconate Cloth  6 each Topical Once  . clopidogrel  75 mg Oral Daily  . collagenase   Topical Daily  . feeding supplement (NEPRO CARB STEADY)  237 mL Oral TID BM  . insulin aspart  0-6 Units Subcutaneous TID WC  . metoprolol tartrate  25 mg Oral BID  . multivitamin  1 tablet Oral QHS  . multivitamin with minerals  1 tablet Oral Daily  . polyethylene glycol  17 g Oral Daily   Continuous Infusions: . sodium chloride 250 mL (12/01/20 2213)  . amiodarone 60 mg/hr (12/05/20 0504)   PRN Meds: sodium chloride, acetaminophen, acetaminophen, HYDROcodone-acetaminophen, ondansetron (ZOFRAN) IV   Vital Signs    Vitals:   12/04/20 2325 12/04/20 2330 12/05/20 0000 12/05/20 0509  BP:  102/66 106/77 (!) 118/98  Pulse: 94 (!) 146 (!) 149 92  Resp: 18 (!) 25 20 17   Temp:   97.8 F (36.6 C) 97.7 F (36.5 C)  TempSrc:   Axillary Oral  SpO2:  100% 100% 100%  Weight:    126.6 kg  Height:        Intake/Output Summary (Last 24 hours) at 12/05/2020 0717 Last data filed at 12/05/2020 0504 Gross per 24 hour  Intake 450 ml  Output 1000 ml  Net -550 ml   Filed Weights   12/03/20 0318 12/04/20 0437 12/05/20 0509  Weight: 123.8 kg 123.8 kg 126.6 kg    Telemetry    Afib with RVR redeveloped at 19:15 on  12/2 with ventricular rates in the 110s to 130s bpm - Personally Reviewed  ECG    Afib with RVR, 149 bpm, nonspecific st/t changes - Personally Reviewed  Physical Exam   GEN: No acute distress.   Neck: JVD difficult to assess secondary to body habitus. Cardiac: Tachycardic, IRIR, I/VI systolic murmur, no rubs, or gallops.  Respiratory: Diminished breath sounds to anterior exam.  GI: Soft, nontender, non-distended.   MS: Status post left AKA. Neuro:  Alert and oriented x 3; Nonfocal.  Psych: Normal affect.  Labs    Chemistry Recent Labs  Lab 11/30/20 2350 12/02/20 0607 12/03/20 0543  NA 137 134* 133*  K 4.2 3.9 4.2  CL 101 96* 96*  CO2 24 27 25   GLUCOSE 136* 140* 139*  BUN 56* 38* 45*  CREATININE 4.42* 3.14* 3.55*  CALCIUM 8.0* 7.8* 7.8*  GFRNONAA 10* 15* 13*  ANIONGAP 12 11 12      Hematology Recent Labs  Lab 12/02/20 0607 12/03/20 0543 12/05/20 0644  WBC 10.1 8.6 10.3  RBC 3.28* 3.55* 3.39*  HGB 8.2* 8.7* 8.3*  HCT 26.3* 28.8* 27.1*  MCV 80.2 81.1 79.9*  MCH 25.0* 24.5* 24.5*  MCHC 31.2 30.2 30.6  RDW 17.2* 16.9* 16.6*  PLT 209 221 232  Cardiac EnzymesNo results for input(s): TROPONINI in the last 168 hours. No results for input(s): TROPIPOC in the last 168 hours.   BNPNo results for input(s): BNP, PROBNP in the last 168 hours.   DDimer No results for input(s): DDIMER in the last 168 hours.   Radiology    DG Knee 1-2 Views Right  Result Date: 11/29/2020 IMPRESSION: Negative for fracture.  Tricompartmental degenerative change. Electronically Signed   By: Franchot Gallo M.D.   On: 11/29/2020 10:28    Cardiac Studies   2D echo 11/25/2020: 1. Left ventricular ejection fraction, by estimation, is 30 to 35%. The  left ventricle has moderately decreased function. The left ventricle  demonstrates global hypokinesis. Unable to exclude regional wall motion  abnormality, select images with  hypokinesis of the anterior and anteroseptal wall though not  well  visualized. Left ventricular diastolic parameters are indeterminate.  2. Right ventricular systolic function is normal. The right ventricular  size is normal. There is mildly elevated pulmonary artery systolic  pressure. The estimated right ventricular systolic pressure is 29.9 mmHg.  3. Left atrial size was moderately dilated.  4. Right atrial size was moderately dilated.  5. Tricuspid valve regurgitation is moderate.  6. Mild mitral valve regurgitation.  7. A small pericardial effusion is present.  8. Rhythm is atrial fibrillation with RVR  Patient Profile     69 y.o. female with history of CVA, PAD s/p left popliteal and AT stenting, CKD stage III, chronic lymphedema, decubitus ulcers, DM2, HTN, and obesity who we are seeing of Afib with RVR and hypotension requiring emergent DCCV.  Assessment & Plan    1. Afib with RVR: -She presented on 11/26 and required emergent DCCV in the ED secondary to hypotension. She redeveloped Afib with RVR and was placed on amiodarone gtt with subsequent a repeat DCCV on 11/29 -Throughout this admission, medication options have been limited secondary to hypotension -She redeveloped Afib with RVR on 12/2 at 19:15 with ventricular rates in the 120s to 140s bpm and remains in Afib with RVR at this time with ventricular rates in the 120-130 bpm range -Overall, this is a difficult situation given recurrence of Afib and limited rate controlling options with relative hypotension precluding beta blocker or calcium channel blocker as well as ESRD precluding digoxin -Continue Lopressor at 25 mg bid with holding parameters for rate control -Continue amiodarone gtt for rate control -NPO  -Will attempt to coordinate TEE-guided DCCV this morning given she had been off anticoagulation for 2.5 days in the perioperative time frame with her leg amputation  -If this cannot be coordinated today with cardiology, echo, and anesthesia, it will need to be deferred  until 12/8 -CHADS2VASc at least 7 -Resumed on Eliquis 12/2 by vascular surgery   -Could consider EP evaluation as well as she may ultimately require AV nodal ablation and PPM, though current timing is not optimal given recent osteomyelitis and initiation of dialysis   2. Elevated troponin: -She denies any chest pain -Mildly elevated and felt to be in the setting of Afib with RVR -Echo this admission with an EF of 35%, possibly stress or tachy-mediated -Outpatient follow up  3. Cardiomyopathy: -Uncertain etiology -Cannot exclude stress vs tachy-mediated vs ischemic -Denies active angina -Will need outpatient ischemic evaluation -Continue Lopressor for now as above for rate control with consideration to transition to Toprol XL or Coreg if possible -Not currently on ACEi/ARB/Entresto/MRA secondary to AKI and hypotension   4. Osteomyelitis: -Status post left AKA 12/2 -Postoperative  stress likely contributing to recurrence of Afib -Per primary and vascular surgery   5. Acute on CKD stage III: -Per IM -Perm-Cath can be placed 12/7 following DCCV if needed  6. Anemia: -Likely mixed in etiology with chronic disease and ABL from surgery -Low thought stable, monitor on OAC/Plavix -Per IM  7. PAD: -Followed by VVS -Plavix resumed by vascular surgery   For questions or updates, please contact [facility] Please consult [email or web address] for contact info under Cardiology/STEMI.    Signed, Christell Faith, PA-C [place] Pager: [phone] 12/05/2020, 7:17 AM

## 2020-12-05 NOTE — Progress Notes (Signed)
Patient arrived to specials. Awake/alert  Verbalizes understanding of procedure TEE and then DCCV if no clot.  Remains on aminodarone gtt at 33.24ml/hr Remains in afib rate 114-130 at intervals, No c/o's of pain at this time.  NPO since MD

## 2020-12-05 NOTE — Progress Notes (Signed)
Patient tolerated TEE without adverse event. Anesthesia at bedside thru out procedure.  Per Dr. Saunders Revel no DCCV at this time.  Provider spoke to patient and patient's husband regarding results of TEE. Patient able to swallow ice chips without event. Remains on amiodarone gtt at 33.63ml/ hr.  Remains in afib.  Report given to [facility].

## 2020-12-05 NOTE — Progress Notes (Signed)
[place] [facility] [place] NAME: Brooke Hopkins    MR#:  [id number]  DATE OF BIRTH:  1951-09-19  SUBJECTIVE:   Husband in the room in the room.  Patient is miserable with pain in her back due to her mattress deflated and laid flat on for several hours. She is got a new mattress. She's got significant wounds on her buttock.  Patient feels very down and depressed. Has been mentioning to her husband about her quality of life and getting tired of being at the hospital for workup. Continues to remain on amiodarone with heart rate jumping around 110 to 120s. Plans for TEE with cardioversion third time    REVIEW OF SYSTEMS:   Review of Systems  Constitutional: Negative for chills, fever and weight loss.  HENT: Negative for ear discharge, ear pain and nosebleeds.   Eyes: Negative for blurred vision, pain and discharge.  Respiratory: Negative for sputum production, shortness of breath, wheezing and stridor.   Cardiovascular: Negative for chest pain, palpitations, orthopnea and PND.  Gastrointestinal: Negative for abdominal pain, diarrhea, nausea and vomiting.  Genitourinary: Negative for frequency and urgency.  Musculoskeletal: Negative for back pain and joint pain.  Neurological: Negative for sensory change, speech change, focal weakness and weakness.  Psychiatric/Behavioral: Negative for depression and hallucinations. The patient is not nervous/anxious.    Tolerating Diet:yesTolerating PT:bed bound status POA   DRUG ALLERGIES:  No Known Allergies  VITALS:  Blood pressure (!) 105/56, pulse (!) 115, temperature 97.8 F (36.6 C), temperature source Oral, resp. rate 18, height 5\' 7"  (1.702 m), weight 126.6 kg, SpO2 100 %.  PHYSICAL EXAMINATION:   Physical Exam  GENERAL:  69 y.o.-year-old patient lying in the bed with no acute distress. Morbid obesity Chronically ill HEENT: Head atraumatic, normocephalic. Oropharynx and nasopharynx clear.  NECK:   Supple, no jugular venous distention. No thyroid enlargement, no tenderness.  LUNGS: Normal breath sounds bilaterally, no wheezing, rales, rhonchi. No use of accessory muscles of respiration.  CARDIOVASCULAR: S1, S2 normal. No murmurs, rubs, or gallops. Tachycardia irregularly irregular rhythm ABDOMEN: Soft, nontender, nondistended. Bowel sounds present. No organomegaly or mass.  EXTREMITIES: left aka. covered with surgical dressing/vac+ NEUROLOGIC: weak, grossly non focal PSYCHIATRIC:  patient is alert and awake.   SKIN: chronic decubitus  as below.   LABORATORY PANEL:  CBC Recent Labs  Lab 12/05/20 0644  WBC 10.3  HGB 8.3*  HCT 27.1*  PLT 232    Chemistries  Recent Labs  Lab 12/03/20 0543 12/04/20 0638 12/05/20 0644  NA   < >  --  132*  K   < >  --  3.3*  CL   < >  --  95*  CO2   < >  --  27  GLUCOSE   < >  --  180*  BUN   < >  --  34*  CREATININE   < >  --  2.87*  CALCIUM   < >  --  7.6*  MG  --  1.8  --    < > = values in this interval not displayed.   Cardiac Enzymes No results for input(s): TROPONINI in the last 168 hours. RADIOLOGY:  No results found. ASSESSMENT AND PLAN:  Brooke Hopkins is a 69 y.o. female with medical history significant for morbid obesity, bedbound status secondary to CVA with left-sided hemiparesis, pressure injury involving the sacrum and left heel, diabetes mellitus and hypertension who was brought into  the ER by EMS after her family had called for low blood sugar.  Atrial fibrillation with rapid ventricular response.   Atrial fibrillation is paroxysmal in nature. --Patient on Eliquis for anticoagulation.  --Patient was placed back on amiodarone drip after left AKA.   --Continue low-dose metoprolol.   ----plans for TEE and cardioversion (this is the third attempt during hospitalization)  Left heel osteomyelitis with full-thickness ulceration and necrosis of muscle and leukocytosis.  --s/p  Left AKA done on 11/30/2020.  Discontinue  antibiotics.  Acute renal failure on chronic kidney disease stage IIIa-- Now on dialysis -- Creatinine increased on a daily basis with a peak of 5.61 on 11/28/2020. --Baseline creatinine 1.03. --  Patient had 3 dialysis sessions here last being yesterday.   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  -- Eliquis to reduce stroke risk.  Stage III decubitus ulcer on buttock.  Present on admission.  See description below  Cirrhosis of liver seen on previous imaging study with elevated liver function test.  -- monitor closely while on amiodarone and anticoagulation  Bedbound status  Right knee pain.  X-ray showing degenerative changes  Morbid obesity.   Anemia.  Hemoglobin dipped down to 8.2  Pressure Injury 11/26/20 Buttocks Left Stage 3 -  Full thickness tissue loss. Subcutaneous fat may be visible but bone, tendon or muscle are NOT exposed. (Active)  11/26/20 1313  Location: Buttocks  Location Orientation: Left  Staging: Stage 3 -  Full thickness tissue loss. Subcutaneous fat may be visible but bone, tendon or muscle are NOT exposed.  Wound Description (Comments):   Present on Admission:      Pressure Injury 11/26/20 Heel Right Unstageable - Full thickness tissue loss in which the base of the injury is covered by slough (yellow, tan, gray, green or brown) and/or eschar (tan, brown or black) in the wound bed. (Active)  11/26/20 1314  Location: Heel  Location Orientation: Right  Staging: Unstageable - Full thickness tissue loss in which the base of the injury is covered by slough (yellow, tan, gray, green or brown) and/or eschar (tan, brown or black) in the wound bed.  Wound Description (Comments):   Present on Admission:      Pressure Injury 12/05/20 Thigh Left;Posterior;Proximal Stage 3 -  Full thickness tissue loss. Subcutaneous fat may be  visible but bone, tendon or muscle are NOT exposed. (Active)  12/05/20 1025  Location: Thigh  Location Orientation: Left;Posterior;Proximal  Staging: Stage 3 -  Full thickness tissue loss. Subcutaneous fat may be visible but bone, tendon or muscle are NOT exposed.  Wound Description (Comments):   Present on Admission: No   palliative care consultation placed. Patient has multiple comorbidities. Long-term has poor prognosis. It has expressed to her husband about her poor quality of life, overall decline and being tired of procedures, dialysis, no overall improvemernt     Procedures: IJ temporary catheter, status post amputation left aka. Family communication : Husband in the room Consults : cardiology, nephrology CODE STATUS: DNR prior to admission DVT Prophylaxis : eliquis  Status is: Inpatient  Remains inpatient appropriate because:IV treatments appropriate due to intensity of illness or inability to take PO   Dispo: The patient is from: Home              Anticipated d/c is to: Home              Anticipated  d/c date is: 3 days              Patient currently is not medically stable to d/c.  Continues to remain on amiodarone drip. Heart rate is still in the 110s. Plans for cardioversion today . She will need permanent dialysis catheter which likely is to be placed on Tuesday or wednesday.  Palliative care to see pt    TOTAL TIME TAKING CARE OF THIS PATIENT: 25 minutes.  >50% time spent on counselling and coordination of care  Note: This dictation was prepared with Dragon dictation along with smaller phrase technology. Any transcriptional errors that result from this process are unintentional.  Fritzi Mandes M.D    [facility]   CC: Primary care physician; Lynnell Jude, MDPatient ID: [name], female   DOB: 14-Mar-1951, 69 y.o.   MRN: [id number]

## 2020-12-05 NOTE — CV Procedure (Signed)
    Transesophageal Echocardiogram Note  Brooke Hopkins [id number] 1951/02/13  Procedure: Transesophageal Echocardiogram Indications: Atrial fibrillation  Procedure Details Consent: Obtained Time Out: Verified patient identification, verified procedure, site/side was marked, verified correct patient position, special equipment/implants available, Radiology Safety Procedures followed,  medications/allergies/relevent history reviewed, required imaging and test results available.  Performed  Medications:  Monitored anesthesia was provided by anesthesia services using propofol.  Left Ventrical:  LVEF < 30%.  Mitral Valve: Normal with mild MR.  Aortic Valve: Moderately thickened without regurgitation.  Tricuspid Valve: Normal with mild to moderate regurgitation.  Pulmonic Valve: Not well visualized.  No significant regurgitation.  Left Atrium/ Left atrial appendage: No obvious LAA thrombus.  There is a pedunculated, mobile mass arising from the interatrial septum.  This could represent a thrombus, less likely vegetation or atrial myxoma.  Atrial septum: Intact by color Doppler.  Aorta: Normal caliber aortic root and ascending aorta.  Descending aorta not well visualized.  Probable left pleural effusion, incompletely evaluated.  Complications: No apparent complications Patient did tolerate procedure well.  Nelva Bush, MD 12/05/2020, 2:21 PM

## 2020-12-05 NOTE — Progress Notes (Signed)
This RN accompanied transport to take patient to special procedure. Report given to [facility] after arrival. Lupita Leash

## 2020-12-05 NOTE — Anesthesia Postprocedure Evaluation (Signed)
Anesthesia Post Note  Patient: Brooke Hopkins  Procedure(s) Performed: CARDIOVERSION (N/A )  Patient location during evaluation: PACU Anesthesia Type: General Level of consciousness: awake and alert Pain management: pain level controlled Vital Signs Assessment: post-procedure vital signs reviewed and stable Respiratory status: spontaneous breathing, nonlabored ventilation, respiratory function stable and patient connected to nasal cannula oxygen Cardiovascular status: blood pressure returned to baseline and stable Postop Assessment: no apparent nausea or vomiting Anesthetic complications: no   No complications documented.   Last Vitals:  Vitals:   12/05/20 1427 12/05/20 1430  BP: 103/67 105/88  Pulse: (!) 106 (!) 106  Resp: (!) 22 18  Temp:    SpO2: 100% 100%    Last Pain:  Vitals:   12/05/20 1430  TempSrc:   PainSc: 0-No pain                 Arita Miss

## 2020-12-05 NOTE — Consult Note (Addendum)
[place] Nurse Consult Note: Reason for Consult: Consult requested to assess wounds.  WOC performed a consult for sacrum, right heel, and right leg on 11/28.  Vascular team is following pt for post-op care of a left AKA.  I attempted to see the patient and reassess wounds as requested, but she is currently in a procedure.  I will try later this afternoon, and if she is still off the floor at that time, then I will perform the consult tomorrow.  Julien Girt MSN, RN, [place], [place], [place]

## 2020-12-05 NOTE — Plan of Care (Signed)
Patient is currently out of room and off floor. Will reattempt visit at another time.

## 2020-12-05 NOTE — Anesthesia Procedure Notes (Addendum)
Procedure Name: MAC Date/Time: 12/05/2020 1:56 PM Performed by: Andria Frames, MD Pre-anesthesia Checklist: Patient identified, Emergency Drugs available, Suction available, Patient being monitored and Timeout performed Oxygen Delivery Method: Simple face mask Induction Type: IV induction

## 2020-12-05 NOTE — Interval H&P Note (Signed)
History and Physical Interval Note:  12/05/2020 1:52 PM  Brooke Hopkins  has presented today for surgery, with the diagnosis of persistent atrial fibrillation with rapid ventricular response.  The various methods of treatment have been discussed with the patient and family. After consideration of risks, benefits and other options for treatment, the patient has consented to  Procedure(s): CARDIOVERSION (N/A) with transesophageal echocardiogram as a surgical intervention.  The patient's history has been reviewed, patient examined, no change in status, stable for surgery.  I have reviewed the patient's chart and labs.  Questions were answered to the patient's satisfaction.     Laval Cafaro

## 2020-12-05 NOTE — Progress Notes (Signed)
[facility]  ROUNDING NOTE   Subjective:   Patient received dialysis treatment yesterday, with UF of 1 L, was unable to tolerate more ultrafiltration due to hypotension.    Objective:  Vital signs in last 24 hours:  Temp:  [97.7 F (36.5 C)-98.4 F (36.9 C)] 98.3 F (36.8 C) (12/07 1529) Pulse Rate:  [30-153] 115 (12/07 1529) Resp:  [12-33] 20 (12/07 1529) BP: (77-151)/(52-113) 118/80 (12/07 1529) SpO2:  [94 %-100 %] 100 % (12/07 1529) Weight:  [126.6 kg] 126.6 kg (12/07 1154)  Weight change: 2.722 kg Filed Weights   12/04/20 0437 12/05/20 0509 12/05/20 1154  Weight: 123.8 kg 126.6 kg 126.6 kg    Intake/Output: I/O last 3 completed shifts: In: 450 [I.V.:400; IV Piggyback:50] Out: 1000 [Other:1000]   Intake/Output this shift:  Total I/O In: 818.5 [I.V.:818.5] Out: -   Physical Exam: General:  Resting in bed, appears uncomfortable  Head:  Oral mucous membranes moist  Eyes:  Anicteric  Lungs:   Lungs diminished at the bases  Heart:  HR 120s to 130s, irregular  Abdomen:  Soft, nontender, obese  Extremities: 2+ peripheral edema. Left AKA   Neurologic:  Awake, alert, speech clear and appropriate  Skin:  left AKA surgical site with dressing clean dry and intact   Access Lt IJ Temporary catheter    Basic Metabolic Panel: Recent Labs  Lab 11/30/20 0605 11/30/20 0605 11/30/20 2350 11/30/20 2350 12/01/20 0520 12/02/20 8938 12/03/20 0543 12/04/20 1017 12/04/20 0918 12/05/20 0644  NA 137  --  137  --   --  134* 133*  --   --  132*  K 3.8  --  4.2  --   --  3.9 4.2  --   --  3.3*  CL 100  --  101  --   --  96* 96*  --   --  95*  CO2 26  --  24  --   --  27 25  --   --  27  GLUCOSE 92  --  136*  --   --  140* 139*  --   --  180*  Hopkins 55*  --  56*  --   --  38* 45*  --   --  34*  CREATININE 4.27*  --  4.42*  --   --  3.14* 3.55*  --   --  2.87*  CALCIUM 7.5*   < > 8.0*   < >  --  7.8* 7.8*  --   --  7.6*  MG 1.5*  --   --   --  1.9 1.8  --  1.8  --    --   PHOS  --   --   --   --   --   --   --   --  4.0  --    < > = values in this interval not displayed.    Liver Function Tests: No results for input(s): AST, ALT, ALKPHOS, BILITOT, PROT, ALBUMIN in the last 168 hours. No results for input(s): LIPASE, AMYLASE in the last 168 hours. No results for input(s): AMMONIA in the last 168 hours.  CBC: Recent Labs  Lab 11/30/20 2350 12/01/20 0520 12/02/20 0607 12/03/20 0543 12/05/20 0644  WBC 11.5* 9.3 10.1 8.6 10.3  HGB 9.5* 8.8* 8.2* 8.7* 8.3*  HCT 30.3* 28.4* 26.3* 28.8* 27.1*  MCV 79.5* 79.6* 80.2 81.1 79.9*  PLT 202 203 209 221 232    Cardiac Enzymes: No  results for input(s): CKTOTAL, CKMB, CKMBINDEX, TROPONINI in the last 168 hours.  BNP: Invalid input(s): POCBNP  CBG: Recent Labs  Lab 12/04/20 1215 12/04/20 1653 12/05/20 0010 12/05/20 0850 12/05/20 1227  GLUCAP 190* 256* 142* 147* 142*    Microbiology: Results for orders placed or performed during the hospital encounter of 11/24/20  Resp Panel by RT-PCR (Flu A&B, Covid) Nasopharyngeal Swab     Status: None   Collection Time: 11/24/20  9:35 AM   Specimen: Nasopharyngeal Swab; Nasopharyngeal(NP) swabs in vial transport medium  Result Value Ref Range Status   SARS Coronavirus 2 by RT PCR NEGATIVE NEGATIVE Final    Comment: (NOTE) SARS-CoV-2 target nucleic acids are NOT DETECTED.  The SARS-CoV-2 RNA is generally detectable in upper respiratory specimens during the acute phase of infection. The lowest concentration of SARS-CoV-2 viral copies this assay can detect is 138 copies/mL. A negative result does not preclude SARS-Cov-2 infection and should not be used as the sole basis for treatment or other patient management decisions. A negative result may occur with  improper specimen collection/handling, submission of specimen other than nasopharyngeal swab, presence of viral mutation(s) within the areas targeted by this assay, and inadequate number of  viral copies(<138 copies/mL). A negative result must be combined with clinical observations, patient history, and epidemiological information. The expected result is Negative.  Fact Sheet for Patients:  EntrepreneurPulse.com.au  Fact Sheet for Healthcare Providers:  IncredibleEmployment.be  This test is no t yet approved or cleared by the [place] FDA and  has been authorized for detection and/or diagnosis of SARS-CoV-2 by FDA under an Emergency Use Authorization (EUA). This EUA will remain  in effect (meaning this test can be used) for the duration of the COVID-19 declaration under Section 564(b)(1) of the Act, 21 U.S.C.section 360bbb-3(b)(1), unless the authorization is terminated  or revoked sooner.       Influenza A by PCR NEGATIVE NEGATIVE Final   Influenza B by PCR NEGATIVE NEGATIVE Final    Comment: (NOTE) The Xpert Xpress SARS-CoV-2/FLU/RSV plus assay is intended as an aid in the diagnosis of influenza from Nasopharyngeal swab specimens and should not be used as a sole basis for treatment. Nasal washings and aspirates are unacceptable for Xpert Xpress SARS-CoV-2/FLU/RSV testing.  Fact Sheet for Patients: EntrepreneurPulse.com.au  Fact Sheet for Healthcare Providers: IncredibleEmployment.be  This test is not yet approved or cleared by the [place] FDA and has been authorized for detection and/or diagnosis of SARS-CoV-2 by FDA under an Emergency Use Authorization (EUA). This EUA will remain in effect (meaning this test can be used) for the duration of the COVID-19 declaration under Section 564(b)(1) of the Act, 21 U.S.C. section 360bbb-3(b)(1), unless the authorization is terminated or revoked.  Performed at [facility], [address] [place], [place] [address]     Coagulation Studies: Recent Labs    12/05/20 0719  LABPROT 21.9*  INR 2.0*    Urinalysis: No  results for input(s): COLORURINE, LABSPEC, PHURINE, GLUCOSEU, HGBUR, BILIRUBINUR, KETONESUR, PROTEINUR, UROBILINOGEN, NITRITE, LEUKOCYTESUR in the last 72 hours.  Invalid input(s): APPERANCEUR    Imaging: No results found.   Medications:   . sodium chloride Stopped (12/05/20 1532)   . apixaban  5 mg Oral BID  . vitamin C  500 mg Oral BID  . butamben-tetracaine-benzocaine      . Chlorhexidine Gluconate Cloth  6 each Topical Q0600  . Chlorhexidine Gluconate Cloth  6 each Topical Once  . clopidogrel  75 mg Oral Daily  .  feeding supplement (NEPRO CARB STEADY)  237 mL Oral TID BM  . insulin aspart  0-6 Units Subcutaneous TID WC  . lidocaine      . metoprolol tartrate  25 mg Oral BID  . multivitamin  1 tablet Oral QHS  . multivitamin with minerals  1 tablet Oral Daily  . polyethylene glycol  17 g Oral Daily  . sodium chloride flush       sodium chloride, acetaminophen, acetaminophen, HYDROcodone-acetaminophen, morphine injection, ondansetron (ZOFRAN) IV  Assessment/ Plan:  Ms. Brooke Hopkins is a 69 y.o. white female with hypertension, diabetes mellitus type II insulin dependent, peripheral arterial disease, CVA, lymphedema, chronic lower extremity wounds, bed bound who was admitted to [facility] on 11/24/2020 for New onset atrial fibrillation ([place]) [I48.91] Elevated troponin [R77.8] Atrial fibrillation with rapid ventricular response ([place]) [I48.91] Acute renal failure, unspecified acute renal failure type ([place]) [N17.9]  # Acute renal failure with metabolic acidosis on chronic kidney disease stage IIIA:  Baseline creatinine of 1.03, GFR of 55 on 09/13/2020  History of proteinuria, hematuria and glycosuria.  Acute renal failure secondary to poor PO intake, nausea, vomiting, acute cardiorenal syndrome and suspect ATN.   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

## 2020-12-05 NOTE — Anesthesia Preprocedure Evaluation (Deleted)
Anesthesia Evaluation  Patient identified by MRN, date of birth, ID band Patient awake    Reviewed: Allergy & Precautions, NPO status , Patient's Chart, lab work & pertinent test results  History of Anesthesia Complications Negative for: history of anesthetic complications  Airway Mallampati: III  TM Distance: <3 FB     Dental  (+) Upper Dentures, Lower Dentures, Dental Advidsory Given   Pulmonary neg pulmonary ROS, neg sleep apnea, neg COPD, Not current smoker,           Cardiovascular hypertension, Pt. on medications (-) angina+ Peripheral Vascular Disease and +CHF (LVEF 35%)  (-) Past MI + dysrhythmias Atrial Fibrillation      Neuro/Psych neg Seizures  Neuromuscular disease CVA (L sided weakness), Residual Symptoms negative psych ROS   GI/Hepatic GERD  Medicated,Elevated enzymes probably due to congestion from afib   Endo/Other  diabetes, Type 2, Insulin DependentMorbid obesity  Renal/GU Renal disease (stones)  negative genitourinary   Musculoskeletal   Abdominal   Peds negative pediatric ROS (+)  Hematology negative hematology ROS (+)   Anesthesia Other Findings Past Medical History: No date: Chronic kidney disease No date: Diabetes mellitus No date: Hypertension No date: Stroke [facility])  Reproductive/Obstetrics                             Anesthesia Physical  Anesthesia Plan  ASA: IV  Anesthesia Plan: General   Post-op Pain Management:    Induction: Intravenous  PONV Risk Score and Plan: 3 and Propofol infusion and TIVA  Airway Management Planned: Natural Airway and Nasal Cannula  Additional Equipment:   Intra-op Plan:   Post-operative Plan: Extubation in OR and Possible Post-op intubation/ventilation  Informed Consent: I have reviewed the patients History and Physical, chart, labs and discussed the procedure including the risks, benefits and alternatives for the  proposed anesthesia with the patient or authorized representative who has indicated his/her understanding and acceptance.       Plan Discussed with: CRNA and Surgeon  Anesthesia Plan Comments:         Anesthesia Quick Evaluation

## 2020-12-05 NOTE — Progress Notes (Signed)
*  PRELIMINARY RESULTS* Echocardiogram Echocardiogram Transesophageal has been performed.  Sherrie Sport 12/05/2020, 2:27 PM

## 2020-12-06 DIAGNOSIS — Z7189 Other specified counseling: Secondary | ICD-10-CM | POA: Diagnosis not present

## 2020-12-06 DIAGNOSIS — Z515 Encounter for palliative care: Secondary | ICD-10-CM | POA: Diagnosis not present

## 2020-12-06 DIAGNOSIS — I5020 Unspecified systolic (congestive) heart failure: Secondary | ICD-10-CM | POA: Diagnosis not present

## 2020-12-06 DIAGNOSIS — I4891 Unspecified atrial fibrillation: Secondary | ICD-10-CM | POA: Diagnosis not present

## 2020-12-06 LAB — GLUCOSE, CAPILLARY
Glucose-Capillary: 83 mg/dL (ref 70–99)
Glucose-Capillary: 119 mg/dL — ABNORMAL HIGH (ref 70–99)
Glucose-Capillary: 105 mg/dL — ABNORMAL HIGH (ref 70–99)
Glucose-Capillary: 93 mg/dL (ref 70–99)

## 2020-12-06 MED ORDER — HALOPERIDOL LACTATE 2 MG/ML PO CONC
0.5000 mg | ORAL | Status: DC | PRN
  Filled 2020-12-06: qty 0.3

## 2020-12-06 MED ORDER — MORPHINE SULFATE (PF) 2 MG/ML IV SOLN
2.0000 mg | INTRAVENOUS | Status: DC | PRN
  Administered 2020-12-06 – 2020-12-07 (×4): 2 mg via INTRAVENOUS
  Filled 2020-12-06 (×4): qty 1

## 2020-12-06 MED ORDER — POLYVINYL ALCOHOL 1.4 % OP SOLN
1.0000 [drp] | Freq: Four times a day (QID) | OPHTHALMIC | Status: DC | PRN
  Filled 2020-12-06: qty 15

## 2020-12-06 MED ORDER — BIOTENE DRY MOUTH MT LIQD: 15.0000 mL | OROMUCOSAL | Status: DC | PRN

## 2020-12-06 MED ORDER — GLYCOPYRROLATE 0.2 MG/ML IJ SOLN
0.2000 mg | INTRAMUSCULAR | Status: DC | PRN
  Filled 2020-12-06: qty 1

## 2020-12-06 MED ORDER — LORAZEPAM 2 MG/ML PO CONC: 1.0000 mg | ORAL | Status: DC | PRN

## 2020-12-06 MED ORDER — COLLAGENASE 250 UNIT/GM EX OINT
TOPICAL_OINTMENT | Freq: Every day | CUTANEOUS | Status: DC
  Filled 2020-12-06: qty 30

## 2020-12-06 MED ORDER — GLYCOPYRROLATE 1 MG PO TABS
1.0000 mg | ORAL_TABLET | ORAL | Status: DC | PRN
  Filled 2020-12-06: qty 1

## 2020-12-06 MED ORDER — OXYCODONE HCL 20 MG/ML PO CONC
5.0000 mg | ORAL | Status: DC | PRN
  Filled 2020-12-06: qty 1

## 2020-12-06 MED ORDER — METOPROLOL TARTRATE 50 MG PO TABS
50.0000 mg | ORAL_TABLET | Freq: Two times a day (BID) | ORAL | Status: DC
  Administered 2020-12-06 – 2020-12-07 (×2): 50 mg via ORAL
  Filled 2020-12-06 (×2): qty 1

## 2020-12-06 MED ORDER — HYDROCODONE-ACETAMINOPHEN 5-325 MG PO TABS
1.0000 | ORAL_TABLET | Freq: Four times a day (QID) | ORAL | Status: DC | PRN
Start: 2020-12-06 — End: 2020-12-06

## 2020-12-06 MED ORDER — LORAZEPAM 2 MG/ML IJ SOLN: 1.0000 mg | INTRAMUSCULAR | Status: DC | PRN

## 2020-12-06 MED ORDER — OXYCODONE HCL 20 MG/ML PO CONC
5.0000 mg | ORAL | Status: DC | PRN
  Administered 2020-12-07: 10 mg via ORAL
  Filled 2020-12-06: qty 1

## 2020-12-06 MED ORDER — HALOPERIDOL LACTATE 5 MG/ML IJ SOLN: 0.5000 mg | INTRAMUSCULAR | Status: DC | PRN

## 2020-12-06 NOTE — Progress Notes (Signed)
[facility]  ROUNDING NOTE   Subjective:   Patient resting in bed, appears depressed. Husband at the bedside.    Objective:  Vital signs in last 24 hours:  Temp:  [97.8 F (36.6 C)-98.7 F (37.1 C)] 97.9 F (36.6 C) (12/08 1603) Pulse Rate:  [108-128] 108 (12/08 1603) Resp:  [0-32] 14 (12/08 1603) BP: (93-118)/(53-99) 101/66 (12/08 1603) SpO2:  [96 %-100 %] 100 % (12/08 1603) Weight:  [106.1 kg] 106.1 kg (12/08 0635)  Weight change: 0.046 kg Filed Weights   12/05/20 0509 12/05/20 1154 12/06/20 0635  Weight: 126.6 kg 126.6 kg 106.1 kg    Intake/Output: I/O last 3 completed shifts: In: 1152.7 [P.O.:240; I.V.:835.7; IV Piggyback:77] Out: 300 [Drains:300]   Intake/Output this shift:  No intake/output data recorded.  Physical Exam: General:  In no acute distress, flat affect  Head:  Normocephalic, atraumatic  Eyes:  Sclerae and conjunctivae clear  Lungs:   Respirations unlabored, lungs diminished at the bases  Heart:  Irregular, tachycardic in 130s  Abdomen:  Soft, nontender, obese  Extremities: 2+ peripheral edema. Left AKA   Neurologic:  Lethargic, able to answer questions appropriately  Skin:  left AKA surgical site with dressing clean dry and intact   Access Lt IJ Temporary catheter    Basic Metabolic Panel: Recent Labs  Lab 11/30/20 0605 11/30/20 0605 11/30/20 2350 11/30/20 2350 12/01/20 0520 12/02/20 5631 12/03/20 0543 12/04/20 4970 12/04/20 0918 12/05/20 0644  NA 137  --  137  --   --  134* 133*  --   --  132*  K 3.8  --  4.2  --   --  3.9 4.2  --   --  3.3*  CL 100  --  101  --   --  96* 96*  --   --  95*  CO2 26  --  24  --   --  27 25  --   --  27  GLUCOSE 92  --  136*  --   --  140* 139*  --   --  180*  BUN 55*  --  56*  --   --  38* 45*  --   --  34*  CREATININE 4.27*  --  4.42*  --   --  3.14* 3.55*  --   --  2.87*  CALCIUM 7.5*   < > 8.0*   < >  --  7.8* 7.8*  --   --  7.6*  MG 1.5*  --   --   --  1.9 1.8  --  1.8  --   --    PHOS  --   --   --   --   --   --   --   --  4.0  --    < > = values in this interval not displayed.    Liver Function Tests: No results for input(s): AST, ALT, ALKPHOS, BILITOT, PROT, ALBUMIN in the last 168 hours. No results for input(s): LIPASE, AMYLASE in the last 168 hours. No results for input(s): AMMONIA in the last 168 hours.  CBC: Recent Labs  Lab 11/30/20 2350 12/01/20 0520 12/02/20 0607 12/03/20 0543 12/05/20 0644  WBC 11.5* 9.3 10.1 8.6 10.3  HGB 9.5* 8.8* 8.2* 8.7* 8.3*  HCT 30.3* 28.4* 26.3* 28.8* 27.1*  MCV 79.5* 79.6* 80.2 81.1 79.9*  PLT 202 203 209 221 232    Cardiac Enzymes: No results for input(s): CKTOTAL, CKMB, CKMBINDEX, TROPONINI in the  last 168 hours.  BNP: Invalid input(s): POCBNP  CBG: Recent Labs  Lab 12/05/20 1631 12/05/20 2115 12/06/20 0803 12/06/20 1131 12/06/20 1620  GLUCAP 129* 108* 83 119* 105*    Microbiology: Results for orders placed or performed during the hospital encounter of 11/24/20  Resp Panel by RT-PCR (Flu A&B, Covid) Nasopharyngeal Swab     Status: None   Collection Time: 11/24/20  9:35 AM   Specimen: Nasopharyngeal Swab; Nasopharyngeal(NP) swabs in vial transport medium  Result Value Ref Range Status   SARS Coronavirus 2 by RT PCR NEGATIVE NEGATIVE Final    Comment: (NOTE) SARS-CoV-2 target nucleic acids are NOT DETECTED.  The SARS-CoV-2 RNA is generally detectable in upper respiratory specimens during the acute phase of infection. The lowest concentration of SARS-CoV-2 viral copies this assay can detect is 138 copies/mL. A negative result does not preclude SARS-Cov-2 infection and should not be used as the sole basis for treatment or other patient management decisions. A negative result may occur with  improper specimen collection/handling, submission of specimen other than nasopharyngeal swab, presence of viral mutation(s) within the areas targeted by this assay, and inadequate number of viral copies(<138  copies/mL). A negative result must be combined with clinical observations, patient history, and epidemiological information. The expected result is Negative.  Fact Sheet for Patients:  EntrepreneurPulse.com.au  Fact Sheet for Healthcare Providers:  IncredibleEmployment.be  This test is no t yet approved or cleared by the [place] FDA and  has been authorized for detection and/or diagnosis of SARS-CoV-2 by FDA under an Emergency Use Authorization (EUA). This EUA will remain  in effect (meaning this test can be used) for the duration of the COVID-19 declaration under Section 564(b)(1) of the Act, 21 U.S.C.section 360bbb-3(b)(1), unless the authorization is terminated  or revoked sooner.       Influenza A by PCR NEGATIVE NEGATIVE Final   Influenza B by PCR NEGATIVE NEGATIVE Final    Comment: (NOTE) The Xpert Xpress SARS-CoV-2/FLU/RSV plus assay is intended as an aid in the diagnosis of influenza from Nasopharyngeal swab specimens and should not be used as a sole basis for treatment. Nasal washings and aspirates are unacceptable for Xpert Xpress SARS-CoV-2/FLU/RSV testing.  Fact Sheet for Patients: EntrepreneurPulse.com.au  Fact Sheet for Healthcare Providers: IncredibleEmployment.be  This test is not yet approved or cleared by the [place] FDA and has been authorized for detection and/or diagnosis of SARS-CoV-2 by FDA under an Emergency Use Authorization (EUA). This EUA will remain in effect (meaning this test can be used) for the duration of the COVID-19 declaration under Section 564(b)(1) of the Act, 21 U.S.C. section 360bbb-3(b)(1), unless the authorization is terminated or revoked.  Performed at [facility], [address] [place], [place] [address]   Culture, blood (single) w Reflex to ID Panel     Status: None (Preliminary result)   Collection Time: 12/05/20  4:12 PM    Specimen: BLOOD  Result Value Ref Range Status   Specimen Description BLOOD BLOOD RIGHT HAND  Final   Special Requests   Final    BOTTLES DRAWN AEROBIC ONLY Blood Culture results may not be optimal due to an inadequate volume of blood received in culture bottles   Culture   Final    NO GROWTH < 24 HOURS Performed at [facility], [address] [place], [place] [address]    Report Status PENDING  Incomplete    Coagulation Studies: Recent Labs    12/05/20 0719  LABPROT 21.9*  INR 2.0*    Urinalysis: No results for input(s): COLORURINE, LABSPEC, PHURINE, GLUCOSEU, HGBUR, BILIRUBINUR, KETONESUR, PROTEINUR, UROBILINOGEN, NITRITE, LEUKOCYTESUR in the last 72 hours.  Invalid input(s): APPERANCEUR    Imaging: ECHO TEE  Result Date: 12/05/2020    TRANSESOPHOGEAL ECHO REPORT   Patient Name:   Brooke Hopkins Date of Exam: 12/05/2020 Medical Rec #:  [id number]         Height:       67.0 in Accession #:    [id number]        Weight:       279.1 lb Date of Birth:  Jul 05, 1951         BSA:          2.330 m Patient Age:    69 years          BP:           105/56 mmHg Patient Gender: F                 HR:           115 bpm. Exam Location:  [facility] Procedure: Transesophageal Echo, Cardiac Doppler and Color Doppler Indications:     Atrial Fibrillation 427.31  History:         Patient has prior history of Echocardiogram examinations, most                  recent 11/25/2020. Stroke; Risk Factors:Hypertension and                  Diabetes.  Sonographer:     Sherrie Sport RDCS (AE) Referring Phys:  [id number] CHRISTOPHER END Diagnosing Phys: Harrell Gave End MD PROCEDURE: After discussion of the risks and benefits of a TEE, an informed consent was obtained from the patient. The transesophogeal probe was passed without difficulty through the esophogus of the patient. Local oropharyngeal anesthetic was provided with viscous lidocaine. Sedation performed by different physician. The patient was monitored while under  deep sedation. Image quality was good. The patient developed no complications during the procedure. IMPRESSIONS  1. Left ventricular ejection fraction, by estimation, is 20 to 25%. The left ventricle has severely decreased function. The left ventricle demonstrates global hypokinesis.  2. Right ventricular systolic function is severely reduced. The right ventricular size is normal.  3. There is a round, pendunculated mass arising from the interatrial septum. Considerations include thrombus, myxoma, and vegetation; given the clinical history, thrombus is most likely. No definite atrial appendage thrombus is seen, though sluggish flow and shadowing from adjacent structures may obscure a thombus. A left atrial/left atrial appendage thrombus was detected.  4. A small pericardial effusion is present. The pericardial effusion is anterior to the right ventricle.  5. The mitral valve is normal in structure. Mild mitral valve regurgitation.  6. Tricuspid valve regurgitation is mild to moderate.  7. The aortic valve is tricuspid. There is moderate calcification of the aortic valve. There is moderate thickening of the aortic valve. Aortic valve regurgitation is not visualized. FINDINGS  Left Ventricle: Left ventricular ejection fraction, by estimation, is 20 to 25%. The left ventricle has severely decreased function. The left ventricle demonstrates global hypokinesis. The left ventricular internal cavity size was normal in size. Right Ventricle: The right ventricular size is normal. Right ventricular systolic function is severely reduced. Left Atrium: There is a round, pendunculated mass arising from the interatrial septum. Considerations include thrombus, myxoma, and vegetation; given the clinical history, thrombus is most likely. No definite atrial  appendage thrombus is seen, though sluggish flow and shadowing from adjacent structures may obscure a thombus. A left atrial/left atrial appendage thrombus was detected.  Pericardium: A small pericardial effusion is present. The pericardial effusion is anterior to the right ventricle. Mitral Valve: The mitral valve is normal in structure. Mild mitral valve regurgitation. Tricuspid Valve: The tricuspid valve is normal in structure. Tricuspid valve regurgitation is mild to moderate. Aortic Valve: The aortic valve is tricuspid. There is moderate calcification of the aortic valve. There is moderate thickening of the aortic valve. Aortic valve regurgitation is not visualized. Pulmonic Valve: The pulmonic valve was not well visualized. Pulmonic valve regurgitation is not visualized. Aorta: The aortic root and ascending aorta are structurally normal, with no evidence of dilitation. IAS/Shunts: No atrial level shunt detected by color flow Doppler. Additional Comments: There is pleural effusion in the left lateral region. Nelva Bush MD Electronically signed by Nelva Bush MD Signature Date/Time: 12/05/2020/4:56:15 PM    Final      Medications:    . apixaban  5 mg Oral BID  . Chlorhexidine Gluconate Cloth  6 each Topical Q0600  . Chlorhexidine Gluconate Cloth  6 each Topical Once  . clopidogrel  75 mg Oral Daily  . collagenase   Topical Daily  . feeding supplement (NEPRO CARB STEADY)  237 mL Oral TID BM  . insulin aspart  0-6 Units Subcutaneous TID WC  . metoprolol tartrate  50 mg Oral BID  . polyethylene glycol  17 g Oral Daily   acetaminophen, acetaminophen, antiseptic oral rinse, glycopyrrolate **OR** glycopyrrolate, haloperidol **OR** haloperidol lactate, LORazepam **OR** LORazepam, morphine injection, ondansetron (ZOFRAN) IV, oxyCODONE **OR** oxyCODONE, polyvinyl alcohol  Assessment/ Plan:  Ms. Brooke Hopkins is a 69 y.o. white female with hypertension, diabetes mellitus type II insulin dependent, peripheral arterial disease, CVA, lymphedema, chronic lower extremity wounds, bed bound who was admitted to [facility] on 11/24/2020 for New onset atrial fibrillation  ([place]) [I48.91] Elevated troponin [R77.8] Atrial fibrillation with rapid ventricular response ([place]) [I48.91] Acute renal failure, unspecified acute renal failure type ([place]) [N17.9]  # Acute renal failure with metabolic acidosis on chronic kidney disease stage IIIA:  Baseline creatinine of 1.03, GFR of 55 on 09/13/2020  History of proteinuria, hematuria and glycosuria.  Acute renal failure secondary to poor PO intake, nausea, vomiting, acute cardiorenal syndrome and suspect ATN.   Patient appears depressed and lethargic Husband at the bedside Dr. 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  There is a pedunculated, mobile mass arising from the interatrial septum.  This could represent a thrombus, less likely vegetation or atrial myxoma.   LOS: 12 Estee Yohe  Renea Schoonmaker 12/8/20217:21 PM

## 2020-12-06 NOTE — Consult Note (Addendum)
Consultation Note Date: 12/06/2020   Patient Name: Brooke Hopkins  DOB: 1951/01/30  MRN: [id number]  Age / Sex: 69 y.o., female  PCP: Lynnell Jude, MD Referring Physician: Nolberto Hanlon, MD  Reason for Consultation: Establishing goals of care  HPI/Patient Profile: Brooke Hopkins is a 70 y.o. 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    Clinical Assessment and Goals of Care: Patient is resting in bed with husband at bedside. Patient's conversation is very limited as she is weak and states she is sleepy. Husband states she has been suffering for a while now with a very poor QOL, and has not had a "normal" life in 4 years. He states she suffers from pain. Patient states she does not feel she is living, but is existing.   We discussed her diagnoses, prognosis, GOC, EOL wishes disposition and options.  A detailed discussion was had today regarding advanced directives.  Concepts specific to code status, artifical feeding and hydration, IV antibiotics and rehospitalization were discussed.  The difference between an aggressive medical intervention path and a comfort care path was discussed.  Values and goals of care important to patient and family were attempted to be elicited.  Discussed limitations of medical interventions to prolong quality of life in some situations and discussed the concept of human mortality.  Husband states he will honor whatever his wife wants to do. Patient states she would like to go home and focus on comfort for what time she has left. She is not interested in returning to the hospital, or pursuing other life prolonging measures.   I  completed a MOST form today with patient and spouse. Husband signed the form as the patient was too weak to do so. The signed original was placed in the chart. A photocopy was placed in the chart to be scanned into EMR. The patient outlined their wishes for the following treatment decisions:  Cardiopulmonary Resuscitation: Do Not Attempt Resuscitation (DNR/No CPR)  Medical Interventions: Comfort Measures: Keep clean, warm, and dry. Use medication by any route, positioning, wound care, and other measures to relieve pain and suffering. Use oxygen, suction and manual treatment of airway obstruction as needed for comfort. Do not transfer to the hospital unless comfort needs cannot be met in current location.  Antibiotics: No antibiotics (use other measures to relieve symptoms)  IV Fluids: No IV fluids (provide other measures to ensure comfort)  Feeding Tube: No feeding tube      [facility] [place] with [facility] as husband's mother is in hospice facility now, and wants this company.   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 The following aspects are pertinent.  Past Medical History:  Diagnosis Date   Chronic kidney disease    Diabetes mellitus    Hypertension    Stroke [facility])    Social History   Socioeconomic History   Marital status: Married    Spouse name: Not on file   Number of children: Not on file   Years of education: Not on file   Highest education level: Not on file  Occupational History   Not on file  Tobacco Use   Smoking status: Never Smoker   Smokeless  tobacco: Never Used  Vaping Use   Vaping Use: Never used  Substance and Sexual Activity   Alcohol use: No   Drug use: No   Sexual activity: Yes    Birth control/protection: Post-menopausal  Other Topics Concern   Not on file  Social History Narrative   Not on file   Social Determinants of Health   Financial Resource Strain:    Difficulty of Paying Living Expenses: Not on file  Food Insecurity:    Worried About Panacea in the Last Year: Not on file   [employer] of Food in the Last Year: Not on file  Transportation Needs:    Lack of Transportation (Medical): Not on file   Lack of Transportation (Non-Medical): Not on file  Physical Activity:    Days of Exercise per Week: Not on file   Minutes of Exercise per Session: Not on file  Stress:    Feeling of Stress : Not on file  Social Connections:    Frequency of Communication with Friends and Family: Not on file   Frequency of Social Gatherings with Friends and Family: Not on file   Attends Religious Services: Not on file   Active Member of Clubs or Organizations: Not on file   Attends Archivist Meetings: Not on file   Marital Status: Not on file   Family History  Problem Relation Age of Onset   CAD Mother    Hypertension Mother    COPD Father    Diabetes Sister    Hypertension Sister    Scheduled Meds:  apixaban  5 mg Oral BID   vitamin C  500 mg Oral BID   Chlorhexidine Gluconate Cloth  6 each Topical Q0600   Chlorhexidine Gluconate Cloth  6 each Topical Once   clopidogrel  75 mg Oral Daily   collagenase   Topical Daily   feeding supplement (NEPRO CARB STEADY)  237 mL Oral TID BM   insulin aspart  0-6 Units Subcutaneous TID WC   metoprolol tartrate  37.5 mg Oral BID   multivitamin  1 tablet Oral QHS   multivitamin with minerals  1 tablet Oral Daily   polyethylene glycol  17 g Oral Daily   Continuous Infusions:  sodium chloride Stopped (12/05/20 1532)    PRN Meds:.sodium chloride, acetaminophen, acetaminophen, HYDROcodone-acetaminophen, morphine injection, ondansetron (ZOFRAN) IV Medications Prior to Admission:  Prior to Admission medications   Medication Sig Start Date End Date Taking? Authorizing Provider  aspirin EC 81 MG EC tablet Take 1 tablet (81 mg total) by mouth daily. Swallow whole. 09/05/20  Yes Shahmehdi, Seyed A, MD  atorvastatin (LIPITOR) 80 MG tablet Take 1 tablet (80 mg total) by mouth daily  at 6 PM. 10/17/15  Yes Gouru, Aruna, MD  cloNIDine (CATAPRES - DOSED IN MG/24 HR) 0.2 mg/24hr patch Place 0.2 mg onto the skin once a week.   Yes [provider]  clopidogrel (PLAVIX) 75 MG tablet Take 75 mg by mouth daily. 11/11/20  Yes [provider]  collagenase (SANTYL) ointment Apply topically daily. 09/27/20  Yes Kris Hartmann, NP  furosemide (LASIX) 20 MG tablet Take 1 tablet (20 mg total) by mouth daily. 09/14/20 09/14/21 Yes Annita Brod, MD  hydrALAZINE (APRESOLINE) 25 MG tablet Take 1 tablet (25 mg total) by mouth every 8 (eight) hours. 06/21/17  Yes Gouru, Illene Silver, MD  insulin NPH-regular Human (NOVOLIN 70/30) (70-30) 100 UNIT/ML injection Inject 10 Units into the skin 2 (two) times daily with a meal. 09/14/20  Yes Annita Brod, MD  labetalol (NORMODYNE) 100 MG tablet Take 1 tablet (100 mg total) by mouth 2 (two) times daily. 09/04/20 11/24/20 Yes Shahmehdi, Seyed A, MD  lisinopril (ZESTRIL) 40 MG tablet Take 40 mg by mouth daily. 10/02/20  Yes [provider]  oxyCODONE (OXY IR/ROXICODONE) 5 MG immediate release tablet Take 5 mg by mouth 4 (four) times daily as needed for pain. 11/20/20  Yes [provider]  metroNIDAZOLE (FLAGYL) 500 MG tablet  10/10/20   [provider]   No Known Allergies Review of Systems  Constitutional: Positive for activity change and fatigue.    Physical Exam Pulmonary:     Effort: Pulmonary effort is normal.  Neurological:     Mental Status: She  is alert.     Vital Signs: BP (!) 114/56 (BP Location: Right Arm)    Pulse (!) 118    Temp 98.1 F (36.7 C) (Oral)    Resp 14    Ht 5' 7"  (1.702 m)    Wt 106.1 kg    SpO2 96%    BMI 36.65 kg/m  Pain Scale: 0-10 POSS *See Group Information*: 1-Acceptable,Awake and alert Pain Score: 0-No pain   SpO2: SpO2: 96 % O2 Device:SpO2: 96 % O2 Flow Rate: .O2 Flow Rate (L/min): 2 L/min  IO: Intake/output summary:   Intake/Output Summary (Last 24 hours) at 12/06/2020 1236 Last data filed at 12/06/2020 1049 Gross per 24 hour  Intake 1032.7 ml  Output 300 ml  Net 732.7 ml    LBM: Last BM Date: 12/06/20 Baseline Weight: Weight: 127 kg Most recent weight: Weight: 106.1 kg     Palliative Assessment/Data:     Time In: 11:45 Time Out: 12:20 Time Total: 35 min Greater than 50%  of this time was spent counseling and coordinating care related to the above assessment and plan.  Signed by: Asencion Gowda, NP   Please contact Palliative Medicine Team phone at [phone] for questions and concerns.  For individual provider: See Shea Evans

## 2020-12-06 NOTE — Progress Notes (Addendum)
Progress Note  Patient Name: Brooke Hopkins Date of Encounter: 12/06/2020  Primary Cardiologist: Kate Sable, MD  Subjective   Lying flat in bed.  No c/p, palpitations, or dyspnea.  Afib rates have been 120-140.  Husband at bedside.  All questions answered.  Inpatient Medications    Scheduled Meds: . apixaban  5 mg Oral BID  . vitamin C  500 mg Oral BID  . Chlorhexidine Gluconate Cloth  6 each Topical Q0600  . Chlorhexidine Gluconate Cloth  6 each Topical Once  . clopidogrel  75 mg Oral Daily  . collagenase   Topical Daily  . feeding supplement (NEPRO CARB STEADY)  237 mL Oral TID BM  . insulin aspart  0-6 Units Subcutaneous TID WC  . metoprolol tartrate  37.5 mg Oral BID  . multivitamin  1 tablet Oral QHS  . multivitamin with minerals  1 tablet Oral Daily  . polyethylene glycol  17 g Oral Daily   Continuous Infusions: . sodium chloride Stopped (12/05/20 1532)   PRN Meds: sodium chloride, acetaminophen, acetaminophen, HYDROcodone-acetaminophen, morphine injection, ondansetron (ZOFRAN) IV   Vital Signs    Vitals:   12/06/20 0620 12/06/20 0625 12/06/20 0635 12/06/20 0759  BP:    114/80  Pulse:    (!) 121  Resp: 13 16  20   Temp:    98.3 F (36.8 C)  TempSrc:    Oral  SpO2:    98%  Weight:   106.1 kg   Height:        Intake/Output Summary (Last 24 hours) at 12/06/2020 0829 Last data filed at 12/06/2020 0500 Gross per 24 hour  Intake 912.7 ml  Output 300 ml  Net 612.7 ml   Filed Weights   12/05/20 0509 12/05/20 1154 12/06/20 0635  Weight: 126.6 kg 126.6 kg 106.1 kg    Physical Exam   GEN: Obese, in no acute distress.  HEENT: Grossly normal.  Neck: Supple, obese, difficult to gauge JVP. HD catheter L IJ.  No carotid bruits, or masses. Cardiac: IR, IR, tachy, distant, I/VI syst murmur, no rubs, or gallops. No clubbing, cyanosis.  2+ RLE pitting edema.  L AKA - no bleeding/erythema. Radials 2+.  Respiratory:  Respirations regular and unlabored,  clear to auscultation anteriorly. GI: Obese, soft, nontender, nondistended, BS + x 4. MS: no deformity or atrophy. Skin: warm and dry, no rash. Neuro:  Strength and sensation are intact. Psych: AAOx3.  Flat affect.  Labs    Chemistry Recent Labs  Lab 12/02/20 0607 12/03/20 0543 12/05/20 0644  NA 134* 133* 132*  K 3.9 4.2 3.3*  CL 96* 96* 95*  CO2 27 25 27   GLUCOSE 140* 139* 180*  BUN 38* 45* 34*  CREATININE 3.14* 3.55* 2.87*  CALCIUM 7.8* 7.8* 7.6*  GFRNONAA 15* 13* 17*  ANIONGAP 11 12 10      Hematology Recent Labs  Lab 12/02/20 0607 12/03/20 0543 12/05/20 0644  WBC 10.1 8.6 10.3  RBC 3.28* 3.55* 3.39*  HGB 8.2* 8.7* 8.3*  HCT 26.3* 28.8* 27.1*  MCV 80.2 81.1 79.9*  MCH 25.0* 24.5* 24.5*  MCHC 31.2 30.2 30.6  RDW 17.2* 16.9* 16.6*  PLT 209 221 232    Cardiac Enzymes  Recent Labs  Lab 11/24/20 0934 11/24/20 1128 11/24/20 1338 11/24/20 1528  TROPONINIHS 194* 190* 205* 212*      Lipids  Lab Results  Component Value Date   CHOL 112 09/14/2020   HDL 31 (L) 09/14/2020   LDLCALC 62 09/14/2020  TRIG 93 09/14/2020   CHOLHDL 3.6 09/14/2020    HbA1c  Lab Results  Component Value Date   HGBA1C 7.1 (H) 11/27/2020    Radiology    ---  Telemetry    Atrial fibrillation 120-140- Personally Reviewed  Cardiovascular Studies/Procedures    2D echo 11/25/2020 1. Left ventricular ejection fraction, by estimation, is 30 to 35%. The  left ventricle has moderately decreased function. The left ventricle  demonstrates global hypokinesis. Unable to exclude regional wall motion  abnormality, select images with  hypokinesis of the anterior and anteroseptal wall though not well  visualized. Left ventricular diastolic parameters are indeterminate.   2. Right ventricular systolic function is normal. The right ventricular  size is normal. There is mildly elevated pulmonary artery systolic  pressure. The estimated right ventricular systolic pressure is 83.1 mmHg.    3. Left atrial size was moderately dilated.   4. Right atrial size was moderately dilated.   5. Tricuspid valve regurgitation is moderate.   6. Mild mitral valve regurgitation.   7. A small pericardial effusion is present.   8. Rhythm is atrial fibrillation with RVR _____________  11/30/2020 PROCEDURE:  Left above-the-knee amputation  PRE-OPERATIVE DIAGNOSIS: Left foot osteomyelitis, stroke, bedbound _____________   Transesophageal Echocardiogram 12.7.2021  Left Ventrical:  LVEF < 30%. Mitral Valve: Normal with mild MR. Aortic Valve: Moderately thickened without regurgitation. Tricuspid Valve: Normal with mild to moderate regurgitation. Pulmonic Valve: Not well visualized.  No significant regurgitation. Left Atrium/ Left atrial appendage: No obvious LAA thrombus.  There is a pedunculated, mobile mass arising from the interatrial septum.  This could represent a thrombus, less likely vegetation or atrial myxoma. Atrial septum: Intact by color Doppler. Aorta: Normal caliber aortic root and ascending aorta.  Descending aorta not well visualized.   Probable left pleural effusion, incompletely evaluated.   Complications: No apparent complications Patient did tolerate procedure well.   Patient Profile     69 y.o. female with history of CVA, PAD s/p left popliteal and AT stenting and L AKA this admission, CKD stage III w/ placement of HD catheter this admission, chronic lymphedema, decubitus ulcers, DM2, HTN, and obesity who we are seeing of Afib with RVR and hypotension requiring emergent DCCV in ED on day of admission w/ recurrent AFib req amio and repeat DCCV, however TEE 12/7 showed LAA thrombus and atrial myxoma.  Assessment & Plan    1.  Atrial fibrillation with rapid ventricular response: Present November 26 required emergent cardioversion in the ED secondary to hypertension.  Recurrent A. fib with RVR requiring initiation of amiodarone infusion followed by repeat  cardioversion November 29.  Recurrent A. 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  In that setting, amiodarone was discontinued and beta-blocker therapy was titrated as tolerated.  Remains anticoagulated with Eliquis.  Rates currently 120s to 140s at rest, though she is asymptomatic.  Very difficult situation given limited options secondary to soft blood pressures and renal failure.  Discussed with patient and husband today.  Due to multiple comorbidities with history of osteomyelitis status post recent left AKA, she is a poor candidate for pacemaker placement and AV nodal ablation.  Continue current dose of metoprolol with goal of rate control.  Seen by palliative care.  2.  Left atrial appendage thrombus: Noted on TEE yesterday.  She was not on anticoagulation prior to admission and anticoagulation was held for approximately 48 hours in the  setting of left AKA.  We will continue Eliquis 5 mg twice daily.  3.  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  Conservative management.  4.  Peripheral arterial disease/osteomyelitis: Status post left AKA December 2nd.  Home dose of statin on hold in the setting of elevated LFTs on admission.  Plavix resumed postoperatively.  5.  Acute on chronic stage III kidney disease: Status post dialysis catheter placement this admission.  Creatinine was 2.87 yesterday.  No labs drawn this morning.  6.  Cardiomyopathy: EF 30-35% by echo this admission.  Etiology uncertain as she is not currently a candidate for ischemic evaluation.  Certainly ongoing tachycardia may be playing a role.  No chest pain.  Continue beta-blocker therapy.  No ACE/ARB/Entresto/MRA secondary to acute on chronic kidney disease.  7.  Hyperlipidemia: Home dose of statin on hold.  Follow-up LFTs.  8.  Transaminitis: LFTs elevated on admission prompting holding of statin therapy.   Follow-up.  9.  Microcytic anemia: Stable the past 3 days.  No labs drawn this morning.   Signed, Murray Hodgkins, NP  12/06/2020, 8:29 AM    For questions or updates, please contact   Please consult [email or web address] for contact info under Cardiology/STEMI.

## 2020-12-06 NOTE — Progress Notes (Signed)
PROGRESS NOTE    Brooke Hopkins  QMG:[id number] DOB: [birth year] DOA: 11/24/2020 PCP: Lynnell Jude, MD    Brief Narrative:  Brooke Hopkins is a 69 y.o. 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    Consultants:   Palliative care, cardiology  Procedures:   Antimicrobials:       Subjective: Husband at bedside feeding patient. Patient reports feeling miserable no other issues. Heart rate under 120's on tele.   Objective: Vitals:   12/06/20 0635 12/06/20 0759 12/06/20 1132 12/06/20 1603  BP:  114/80 (!) 114/56 101/66  Pulse:  (!) 121 (!) 118 (!) 108  Resp:  20 14 14   Temp:  98.3 F (36.8 C) 98.1 F (36.7 C) 97.9 F (36.6 C)  TempSrc:  Oral Oral   SpO2:  98% 96% 100%  Weight: 106.1 kg     Height:        Intake/Output Summary (Last 24 hours) at 12/06/2020 1653 Last data filed at 12/06/2020 1049 Gross per 24 hour  Intake 214.19 ml  Output 300 ml  Net -85.81 ml   Filed Weights   12/05/20 0509 12/05/20 1154 12/06/20 0635  Weight: 126.6 kg 126.6 kg 106.1 kg    Examination:  General exam: appears tired. Eating , husband at bedside Respiratory system: coarse bs, no wheezing Cardiovascular system: S1 & S2 irreg, tachy Gastrointestinal system: Abdomen is nondistended, soft and nontender. Normal bowel sounds heard. Central nervous system: awake. Not able to do full assessment Extremities: +edema     Data Reviewed: I have personally reviewed following labs and imaging studies  CBC: Recent Labs  Lab 11/30/20 2350 12/01/20 0520 12/02/20 0607 12/03/20 0543 12/05/20 0644  WBC 11.5* 9.3 10.1 8.6 10.3  HGB 9.5* 8.8* 8.2* 8.7* 8.3*  HCT 30.3* 28.4* 26.3* 28.8* 27.1*  MCV 79.5* 79.6* 80.2 81.1 79.9*  PLT 202  203 209 221 326   Basic Metabolic Panel: Recent Labs  Lab 11/30/20 0605 11/30/20 2350 12/01/20 0520 12/02/20 0607 12/03/20 0543 12/04/20 0638 12/04/20 0918 12/05/20 0644  NA 137 137  --  134* 133*  --   --  132*  K 3.8 4.2  --  3.9 4.2  --   --  3.3*  CL 100 101  --  96* 96*  --   --  95*  CO2 26 24  --  27 25  --   --  27  GLUCOSE 92 136*  --  140* 139*  --   --  180*  BUN 55* 56*  --  38* 45*  --   --  34*  CREATININE 4.27* 4.42*  --  3.14* 3.55*  --   --  2.87*  CALCIUM 7.5* 8.0*  --  7.8* 7.8*  --   --  7.6*  MG 1.5*  --  1.9 1.8  --  1.8  --   --   PHOS  --   --   --   --   --   --  4.0  --    GFR: Estimated Creatinine Clearance: 22.5 mL/min (A) (by C-G formula based on SCr of 2.87 mg/dL (H)). Liver Function Tests: No results for input(s): AST, ALT, ALKPHOS, BILITOT, PROT, ALBUMIN in the last 168 hours. No results for input(s): LIPASE,  AMYLASE in the last 168 hours. No results for input(s): AMMONIA in the last 168 hours. Coagulation Profile: Recent Labs  Lab 11/30/20 0605 12/05/20 0719  INR 2.1* 2.0*   Cardiac Enzymes: No results for input(s): CKTOTAL, CKMB, CKMBINDEX, TROPONINI in the last 168 hours. BNP (last 3 results) No results for input(s): PROBNP in the last 8760 hours. HbA1C: No results for input(s): HGBA1C in the last 72 hours. CBG: Recent Labs  Lab 12/05/20 1631 12/05/20 2115 12/06/20 0803 12/06/20 1131 12/06/20 1620  GLUCAP 129* 108* 83 119* 105*   Lipid Profile: No results for input(s): CHOL, HDL, LDLCALC, TRIG, CHOLHDL, LDLDIRECT in the last 72 hours. Thyroid Function Tests: No results for input(s): TSH, T4TOTAL, FREET4, T3FREE, THYROIDAB in the last 72 hours. Anemia Panel: No results for input(s): VITAMINB12, FOLATE, FERRITIN, TIBC, IRON, RETICCTPCT in the last 72 hours. Sepsis Labs: No results for input(s): PROCALCITON, LATICACIDVEN in the last 168 hours.  Recent Results (from the past 240 hour(s))  Culture, blood (single) w Reflex to  ID Panel     Status: None (Preliminary result)   Collection Time: 12/05/20  4:12 PM   Specimen: BLOOD  Result Value Ref Range Status   Specimen Description BLOOD BLOOD RIGHT HAND  Final   Special Requests   Final    BOTTLES DRAWN AEROBIC ONLY Blood Culture results may not be optimal due to an inadequate volume of blood received in culture bottles   Culture   Final    NO GROWTH < 24 HOURS Performed at [facility], [address], [place], [place] [address]    Report Status PENDING  Incomplete         Radiology Studies: ECHO TEE  Result Date: 12/05/2020    TRANSESOPHOGEAL ECHO REPORT   Patient Name:   Brooke Hopkins Date of Exam: 12/05/2020 Medical Rec #:  [id number]         Height:       67.0 in Accession #:    [id number]        Weight:       279.1 lb Date of Birth:  [birth year]         BSA:          2.330 m Patient Age:    89 years          BP:           105/56 mmHg Patient Gender: F                 HR:           115 bpm. Exam Location:  [facility] Procedure: Transesophageal Echo, Cardiac Doppler and Color Doppler Indications:     Atrial Fibrillation 427.31  History:         Patient has prior history of Echocardiogram examinations, most                  recent 11/25/2020. Stroke; Risk Factors:Hypertension and                  Diabetes.  Sonographer:     Sherrie Sport RDCS (AE) Referring Phys:  [id number] CHRISTOPHER END Diagnosing Phys: Harrell Gave End MD PROCEDURE: After discussion of the risks and benefits of a TEE, an informed consent was obtained from the patient. The transesophogeal probe was passed without difficulty through the esophogus of the patient. Local oropharyngeal anesthetic was provided with viscous lidocaine. Sedation performed by different physician. The patient was monitored while under deep sedation. Image quality  was good. The patient developed no complications during the procedure. IMPRESSIONS  1. Left ventricular ejection fraction, by estimation, is 20 to 25%. The left  ventricle has severely decreased function. The left ventricle demonstrates global hypokinesis.  2. Right ventricular systolic function is severely reduced. The right ventricular size is normal.  3. There is a round, pendunculated mass arising from the interatrial septum. Considerations include thrombus, myxoma, and vegetation; given the clinical history, thrombus is most likely. No definite atrial appendage thrombus is seen, though sluggish flow and shadowing from adjacent structures may obscure a thombus. A left atrial/left atrial appendage thrombus was detected.  4. A small pericardial effusion is present. The pericardial effusion is anterior to the right ventricle.  5. The mitral valve is normal in structure. Mild mitral valve regurgitation.  6. Tricuspid valve regurgitation is mild to moderate.  7. The aortic valve is tricuspid. There is moderate calcification of the aortic valve. There is moderate thickening of the aortic valve. Aortic valve regurgitation is not visualized. FINDINGS  Left Ventricle: Left ventricular ejection fraction, by estimation, is 20 to 25%. The left ventricle has severely decreased function. The left ventricle demonstrates global hypokinesis. The left ventricular internal cavity size was normal in size. Right Ventricle: The right ventricular size is normal. Right ventricular systolic function is severely reduced. Left Atrium: There is a round, pendunculated mass arising from the interatrial septum. Considerations include thrombus, myxoma, and vegetation; given the clinical history, thrombus is most likely. No definite atrial appendage thrombus is seen, though sluggish flow and shadowing from adjacent structures may obscure a thombus. A left atrial/left atrial appendage thrombus was detected. Pericardium: A small pericardial effusion is present. The pericardial effusion is anterior to the right ventricle. Mitral Valve: The mitral valve is normal in structure. Mild mitral valve  regurgitation. Tricuspid Valve: The tricuspid valve is normal in structure. Tricuspid valve regurgitation is mild to moderate. Aortic Valve: The aortic valve is tricuspid. There is moderate calcification of the aortic valve. There is moderate thickening of the aortic valve. Aortic valve regurgitation is not visualized. Pulmonic Valve: The pulmonic valve was not well visualized. Pulmonic valve regurgitation is not visualized. Aorta: The aortic root and ascending aorta are structurally normal, with no evidence of dilitation. IAS/Shunts: No atrial level shunt detected by color flow Doppler. Additional Comments: There is pleural effusion in the left lateral region. Nelva Bush MD Electronically signed by Nelva Bush MD Signature Date/Time: 12/05/2020/4:56:15 PM    Final         Scheduled Meds: . apixaban  5 mg Oral BID  . Chlorhexidine Gluconate Cloth  6 each Topical Q0600  . Chlorhexidine Gluconate Cloth  6 each Topical Once  . clopidogrel  75 mg Oral Daily  . collagenase   Topical Daily  . feeding supplement (NEPRO CARB STEADY)  237 mL Oral TID BM  . insulin aspart  0-6 Units Subcutaneous TID WC  . metoprolol tartrate  50 mg Oral BID  . 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)   Obesity, Class III, BMI 40-49.9 (morbid obesity) (HCC)   Venous stasis ulcer of ankle, left (HCC)   Hypertension   Exogenous obesity   Acute renal failure superimposed on stage 3a chronic kidney disease (HCC)   Transaminitis   Elevated troponin  PAD (peripheral artery disease) (HCC)   Subacute osteomyelitis of left foot (HCC)   AF (paroxysmal atrial fibrillation) (HCC)   Bedbound   Acute pain of right knee   Acquired thrombophilia ([place])   Other cirrhosis of liver (HCC)    HFrEF (heart failure with reduced ejection fraction) ([place])   Brooke Scharfenberg Blackwoodis a 69 y.o.femalewith medical history significant formorbid obesity, bedbound status secondary to CVA with left-sided hemiparesis, pressure injury involving the sacrum and left heel, diabetes mellitusandhypertension who was brought into the ER by EMS after her family had called for low blood sugar.  Atrial fibrillation with rapid ventricular response.  Atrial fibrillation is paroxysmal in nature. On eliquis TEE with LAA mass Not surgical candidate Amiodarone was d/'cd Increase beta blk to 50mg  bid Plan for comfort care, with home hospice.  Left heel osteomyelitis with full-thickness ulceration and necrosis of muscle and leukocytosis.  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   Antibiotics discontinued    Acute renal failure on chronic kidney disease stage IIIa-- Now on dialysis --Creatinine increased on a daily basis with a peak of 5.61 on 11/28/2020. --Baseline creatinine 1.03. -- Patient had 3 dialysis sessions here last being yesterday.  --Permacath on hold as patient is going home with hospice  Currently with left IJ temporary catheter    Acquired thrombophilia secondary to atrial fibrillation.  On Eliquis  Stage III decubitus ulcer on buttock. Present on admission. See description below  Cirrhosis of liver seen on previous imaging study with elevated liver function test.  -- monitor closely while on amiodarone and anticoagulation  Bedbound status  Right knee pain. X-ray showing degenerative changes  Morbid obesity.  Anemia. Hemoglobin dipped down to 8.2  DVT prophylaxis: Eliquis Code Status: DNR Family Communication: Husband at bedside.  Spoke to husband currently patient is on comfort care with home with hospice.  While patient is here husband would like to continue anticoagulation and other medications.  But can discontinue upon discharging to home.  Status is:  Inpatient  Remains inpatient appropriate because:Inpatient level of care appropriate due to severity of illness   Dispo: The patient is from: Home              Anticipated d/c is to: Home              Anticipated d/c date is: 1 day              Patient currently is not medically stable to d/c.  Home with hospice in a.m. if all set up            LOS: 12 days   Time spent: 35 min with more than 50% on [place], MD [facility] Pager [phone]  If 7PM-7AM, please contact night-coverage 12/06/2020, 4:53 PM

## 2020-12-06 NOTE — Progress Notes (Signed)
[place] Room [facility]) Hospital Liaison RN note:  Received request from Asencion Gowda, NP for hospice services at home after discharge. Chart and patient information under review by [facility] physician. Hospice eligibility is pending.  Spoke with patient and spouse in room to initiate education related to hospice philosophy, services and to answer any questions. They both verbalized understanding and all questions were answered. The plan is for discharge home by EMS tomorrow.  DME needs discussed. Patient already has a hospital bed and other DME that is needed, so no DME needs. Address and contacts are verified and correct in the chart.  Please send signed and completed DNR home with patient. Please provide prescriptions at discharge as needed to ensure ongoing symptom management.  [facility] information and contact numbers given to spouse. Above information shared with care team.  Please call with any hospice related questions or concerns.  Thank you for the opportunity to participate in this patient's care.  Zandra Abts, RN [facility] Liaison [phone]

## 2020-12-06 NOTE — Plan of Care (Signed)
°  Problem: Education: °Goal: Knowledge of disease or condition will improve °Outcome: Progressing °Goal: Understanding of medication regimen will improve °Outcome: Progressing °  °

## 2020-12-06 NOTE — Progress Notes (Signed)
Patients HR sustaining in the 140's, afib. Hassan Rowan NP notified. New orders placed for IV albumin and 5mg  IV metoprolol.

## 2020-12-06 NOTE — Consult Note (Addendum)
[facility] Nurse Consult Note: Reason for Consult: Consult requested to assess wounds.  WOC performed a consult for sacrum, left heel, and left thigh on 11/28. Pt had surgery and vascular team is following pt for post-op care of a left AKA and Vac placement; please refer to their team for further questions regarding this site.  Sacrum with previously noted stage 3 pressure injury, present on admission.  This is healing and there are patchy areas of partial thickness skin loss approx 3X3X.2cm to this location, interspersed with dark reddish purple deep tissue injuries. Small amt yellow drainage, no odor. Right lower buttock with 2 areas of deep tissue pressure injuries: evolving into unstageable yellow slough, affected area is approx 6X5cm, mod amt tan drainage, no odor or fluctuance. This location was not present on admission. Right posterior leg with dark purple hematoma; approx 6X3cm, mod amt yellow weeping drainage, no odor. Topical treatment orders provided for bedside nurses to perform as follows to assist with enzymatic debridement of nonviable tissue: Apply Santyl to right lower buttock wounds Q day, then cover with moist gauze and foam dressing.  (Change foam dressing Q 3 days or PRN soiling. Foam dressing to right posterior leg and sacrum; change Q 3 days or PRN soiling.  Prevalon boot to reduce pressure to RLE. Pt is on a low airloss mattress to reduce pressure.  [place] team will reassess right lower buttock location weekly to determine if a change in the plan of care should be made at that time.  Julien Girt MSN, RN, [place], [place], [place]

## 2020-12-07 ENCOUNTER — Ambulatory Visit: Payer: Medicare HMO | Admitting: Physician Assistant

## 2020-12-07 LAB — GLUCOSE, CAPILLARY: Glucose-Capillary: 89 mg/dL (ref 70–99)

## 2020-12-07 MED ORDER — OXYCODONE HCL 20 MG/ML PO CONC: 10.0000 mg | ORAL | 0 refills | Status: AC | PRN

## 2020-12-07 MED ORDER — PNEUMOCOCCAL VAC POLYVALENT 25 MCG/0.5ML IJ INJ: 0.5000 mL | INJECTION | INTRAMUSCULAR | Status: DC

## 2020-12-07 MED ORDER — POLYETHYLENE GLYCOL 3350 17 G PO PACK: 17.0000 g | PACK | Freq: Every day | ORAL | 0 refills | Status: AC

## 2020-12-07 MED ORDER — GLYCOPYRROLATE 1 MG PO TABS: 1.0000 mg | ORAL_TABLET | ORAL | 0 refills | Status: AC | PRN

## 2020-12-07 MED ORDER — LORAZEPAM 2 MG/ML PO CONC: 1.0000 mg | ORAL | 0 refills | Status: AC | PRN

## 2020-12-07 MED ORDER — POLYVINYL ALCOHOL 1.4 % OP SOLN: 1.0000 [drp] | Freq: Four times a day (QID) | OPHTHALMIC | 0 refills | Status: AC | PRN

## 2020-12-07 NOTE — Anesthesia Postprocedure Evaluation (Signed)
Anesthesia Post Note  Patient: Brooke Hopkins  Procedure(s) Performed: AMPUTATION ABOVE KNEE (Left Knee) APPLICATION OF WOUND VAC (Left Leg Upper)  Patient location during evaluation: PACU Anesthesia Type: General Level of consciousness: awake and alert Pain management: pain level controlled Vital Signs Assessment: post-procedure vital signs reviewed and stable Respiratory status: spontaneous breathing, nonlabored ventilation, respiratory function stable and patient connected to nasal cannula oxygen Cardiovascular status: blood pressure returned to baseline and stable Postop Assessment: no apparent nausea or vomiting Anesthetic complications: no   No complications documented.   Last Vitals:  Vitals:   12/06/20 1603 12/06/20 2027  BP: 101/66 110/61  Pulse: (!) 108 (!) 113  Resp: 14 20  Temp: 36.6 C 36.7 C  SpO2: 100% 100%    Last Pain:  Vitals:   12/07/20 0626  TempSrc:   PainSc: 10-Worst pain ever                 Alphonsus Sias

## 2020-12-07 NOTE — TOC Transition Note (Signed)
Transition of Care [facility]) - CM/SW Discharge Note   Patient Details  Name: CHERISH RUNDE MRN: [id number] Date of Birth: 28-Feb-1951  Transition of [facility]) CM/SW Contact:  Kerin Salen, RN Phone Number: 12/07/2020, 9:56 AM   Clinical Narrative:   Patient scheduled for Discharge to home today. Husband at bedside, voices no concerns or needs at this time. Given my phone number to call if needed. [place] from [place] notified of discharge, [facility] arranged.    Final next level of care: Home w Hospice Care Barriers to Discharge: Hospice Bed not available   Patient Goals and CMS Choice Patient states their goals for this hospitalization and ongoing recovery are:: Per husband patient to go home.   Choice offered to / list presented to : NA  Discharge Placement                Patient to be transferred to facility by: [place] Name of family member notified: Husband Patient and family notified of of transfer: 12/07/20  Discharge Plan and Services In-house Referral: NA Discharge Planning Services: Other - See comment (Continue HH services RN/PT/OTJackquline [place]) Post Acute Care Choice: Home Health          DME Arranged: Hospital bed DME Agency: [facility] [place] Date DME Agency Contacted: 12/07/20   Representative spoke with at DME Agency: [place]: RN,PT,OT,Nurse's [place]: [facility] Date [place]: 12/07/20 Time [place]: [phone] Representative spoke with at [place]: [place]  Social Determinants of Health ([place]) Interventions     Readmission Risk Interventions Readmission Risk Prevention Plan 11/28/2020  Transportation Screening Complete  PCP or Specialist Appt within 5-7 Days Not Complete  Not Complete comments To be scheduled by unit secretary when discharged  [place] Screening Complete  Medication Review (RN CM) Complete  Some recent data might be hidden

## 2020-12-07 NOTE — Discharge Summary (Signed)
Brooke Hopkins PZW:[id number] DOB: 1951-12-20 DOA: 12/05/2020  PCP: Lynnell Jude, MD  Admit date: 12/05/2020 Discharge date: 12/07/2020  Admitted From: home Disposition:  Home with Hospice  Recommendations for Outpatient Follow-up:  1. Follow up with PCP in 1 week 2. Please obtain BMP/CBC in one week   Home Health:hospice    Discharge Condition:Stable CODE STATUS:DNR  Diet recommendation: as tolerated, heart healthy Brief/Interim Summary: Per admission HPI: Brooke Hopkins is a 69 y.o. 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  When EMS arrived patient's blood sugar was 50 and family had offered her some juice to drink, repeat blood sugar was 116.  During the EMS evaluation she was noted to be tachycardic with heart rate between 1 15-20 and appeared to be in atrial fibrillation.  Patient does not have a known history of A. fib.  She was brought into the ER for further evaluation. She complains of shortness of breath and palpitations. Patient was found with new onset atrial fibrillation. Cardiology was consulted.  Atrial fibrillation with rapid ventricular response.  Atrial fibrillation is paroxysmal in nature. On eliquis TEE with LAA mass/thrombus Not surgical candidate Amiodarone was d/'cd Husband wanted beta blockers to be discontinued on discharge as pt is going home with home hospice/comfort care measures.This was discussed personally with him   Left heel osteomyelitiswith full-thickness ulceration and necrosis of muscle and leukocytosis.  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   Antibiotics discontinued    Acute renal failure on chronic kidney disease stage IIIa--Now on dialysis --Creatinine increased on a daily basis  with a peak of 5.61 on 11/28/2020. --Baseline creatinine 1.03. Was started on hemodialysis  --Permacath on hold as patient is going home with hospice  Left IJ cath discontinued.   Acquired thrombophilia secondary to atrial fibrillation.  D/c eliquis on discharge per pt's husband request  Stage III decubitus ulcer on buttock. Present on admission. Cirrhosis of liver seen on previous imaging study with elevated liver function test.  Right knee pain. X-ray showing degenerative changes   Medication: Per hospice- unfortunetly the medication reconciliation was not showing on discharge.  However I spoke to pt's husband this am, husband does not want any medications such as a/c, bp meds, cardiac meds, on discharge. Only wants medications that is going to make her comfortable .     No Known Allergies  Consultations:  Palliative, nephrology,  cardiology   Procedures/Studies: DG Knee 1-2 Views Right  Result Date: 11/29/2020 CLINICAL DATA:  Fall. EXAM: RIGHT KNEE - 1-2 VIEW COMPARISON:  04/30/2017 FINDINGS: Suboptimal study due to technique and positioning. Generalized osteopenia.  No fracture identified Tricompartmental degenerative change with joint space narrowing and spurring most severe in the patellofemoral joint. Chondrocalcinosis. Arterial calcification IMPRESSION: Negative for fracture.  Tricompartmental degenerative change. Electronically Signed   By: Franchot Gallo M.D.   On: 11/29/2020 10:28   CT HEAD WO CONTRAST  Result Date: 11/25/2020 CLINICAL DATA:  Altered mental status. EXAM: CT HEAD WITHOUT CONTRAST TECHNIQUE: Contiguous axial images were obtained from the base of the skull through the vertex without intravenous contrast. COMPARISON:  MR brain dated 09/13/2020 and CT head dated 09/12/2020. FINDINGS: Brain: No evidence of acute infarction, hemorrhage, hydrocephalus, extra-axial collection or mass lesion/mass effect. The previously seen subcentimeter acute infarct  is not identified on today's exam. 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 There is mild cerebral volume loss with associated ex vacuo dilatation. Periventricular white matter hypoattenuation likely represents chronic small vessel ischemic disease. Vascular: There are vascular calcifications in the carotid siphons. Skull: Normal. Negative for fracture or focal lesion. Sinuses/Orbits: No acute finding. Other: None. IMPRESSION: No acute intracranial process. Electronically Signed   By: Zerita Boers M.D.   On: 11/25/2020 15:57   US Abdomen Complete  Result Date: 11/24/2020 CLINICAL DATA:  Transaminitis. EXAM: ABDOMEN ULTRASOUND COMPLETE COMPARISON:  None. FINDINGS: Gallbladder: Surgically removed. Common bile duct: Diameter: 0.4 cm Liver: No focal lesion identified. Echogenicity is within normal limits. Liver contour is mildly nodular. Portal vein is patent on color Doppler imaging with normal direction of blood flow towards the liver. IVC: No abnormality visualized. Pancreas: Visualized portion unremarkable. Spleen: Size and appearance within normal limits. Right Kidney: Length: 9.3 cm. Echogenicity within normal limits. No mass or hydronephrosis visualized. Left Kidney: Length: 9.7 cm. Echogenicity within normal limits. No mass or hydronephrosis visualized. Abdominal aorta: Mid and distal abdominal aorta cannot be visualized due to bowel gas. Other findings: Right pleural effusion. IMPRESSION: 1. Liver contour is mildly nodular and concerning for cirrhosis. No biliary dilatation. Cholecystectomy. 2. Right pleural effusion. Electronically Signed   By: Markus Daft M.D.   On: 11/24/2020 15:25   PERIPHERAL VASCULAR CATHETERIZATION  Result Date: 11/28/2020 See Op Note  DG Chest Port 1 View  Result Date: 11/28/2020 CLINICAL DATA:  Central line placement. EXAM: PORTABLE CHEST 1 VIEW COMPARISON:  November 24, 2020. FINDINGS: Stable cardiomegaly. Interval  placement of left internal jugular catheter with distal tip in expected position of the right atrium. No pneumothorax is noted. Mild bibasilar subsegmental atelectasis is noted. Small right pleural effusion may be present. Bony thorax is unremarkable. IMPRESSION: Interval placement of left internal jugular catheter with distal tip in expected position of the right atrium. No pneumothorax is noted. Mild bibasilar subsegmental atelectasis is noted. Electronically Signed   By: Marijo Conception M.D.   On: 11/28/2020 14:55   DG Chest Portable 1 View  Result Date: 11/24/2020 CLINICAL DATA:  New onset atrial fibrillation.  Shortness of breath. EXAM: PORTABLE CHEST 1 VIEW COMPARISON:  Chest x-ray dated November 11, 2016. FINDINGS: The patient is rotated to the right, limiting evaluation. Stable cardiomediastinal silhouette with heart size at the upper limits of normal. Pulmonary vascular congestion. Possible small right pleural effusion. No consolidation or pneumothorax. No acute osseous abnormality. IMPRESSION: 1. Limited study due to patient rotation. Pulmonary vascular congestion and possible small right pleural effusion. Electronically Signed   By: Titus Dubin M.D.   On: 11/24/2020 09:51   DG Foot 2 Views Left  Result Date: 11/27/2020 CLINICAL DATA:  69 year old female with diabetes and concern for osteomyelitis. EXAM: LEFT FOOT - 2 VIEW COMPARISON:  Left foot radiograph dated 08/29/2020. FINDINGS: Evaluation is very limited due to advanced osteopenia. No definite acute fracture or dislocation. There is chronic changes of the ankle joint and old healed fracture of the distal tibia. Diffuse soft tissue swelling and subcutaneous edema. No radiopaque foreign object or soft tissue gas. Dressing noted over the plantar heel. IMPRESSION: 1. No definite acute fracture or dislocation. 2. Severe osteopenia with chronic changes of the ankle joint. 3. Diffuse soft tissue swelling and subcutaneous edema. Electronically  Signed   By: Anner Crete M.D.   On: 11/27/2020 15:11   ECHOCARDIOGRAM COMPLETE  Result Date: 11/25/2020    ECHOCARDIOGRAM REPORT   Patient Name:   TANAISHA PITTMAN Date of Exam: 11/25/2020 Medical Rec #:  [id number]         Height:       67.0 in Accession #:    [id number]        Weight:       271.9 lb Date of Birth:  28-Aug-1951         BSA:          2.304 m Patient Age:    18 years          BP:           116/49 mmHg Patient Gender: F                 HR:           154 bpm. Exam Location:  [facility] Procedure: 2D Echo, Cardiac Doppler and Color Doppler Indications:     Atrial Fibrillation 427.31 / I48.91  History:         Patient has prior history of Echocardiogram examinations. Risk                  Factors:Hypertension.  Sonographer:     Alyse Low Roar Referring Phys:  [place] Diagnosing Phys: Ida Rogue MD IMPRESSIONS  1. Left ventricular ejection fraction, by estimation, is 30 to 35%. The left ventricle has moderately decreased function. The left ventricle demonstrates global hypokinesis. Unable to exclude regional wall motion abnormality, select images with hypokinesis of the anterior and anteroseptal wall though not well visualized. Left ventricular diastolic parameters are indeterminate.  2. Right ventricular systolic function is normal. The right ventricular size is normal. There is mildly elevated pulmonary artery systolic pressure. The estimated right ventricular systolic pressure is 28.3 mmHg.  3. Left atrial size was moderately dilated.  4. Right atrial size was moderately dilated.  5. Tricuspid valve regurgitation is moderate.  6. Mild mitral valve regurgitation.  7. A small pericardial effusion is present.  8. Rhythm is atrial fibrillation with RVR FINDINGS  Left Ventricle: Left ventricular ejection fraction, by estimation, is 30 to 35%. The left ventricle has moderately decreased function. The left ventricle demonstrates global hypokinesis. The left ventricular internal  cavity size was normal in size. There is no left ventricular hypertrophy. Left ventricular diastolic parameters are indeterminate. Right Ventricle: The right ventricular size is normal. No increase in right ventricular wall thickness. Right ventricular systolic function is normal. There is mildly elevated pulmonary artery systolic pressure. The tricuspid regurgitant velocity is 2.97  m/s, and with an assumed right atrial pressure of 5 mmHg, the estimated right ventricular systolic pressure is 15.1 mmHg. Left Atrium: Left atrial size was moderately dilated. Right Atrium: Right atrial size was moderately dilated. Pericardium: A small pericardial effusion is present. Mitral Valve: The mitral valve is normal in structure. Mild mitral valve regurgitation. No evidence of mitral valve stenosis. Tricuspid Valve: The tricuspid valve is normal in structure. Tricuspid valve regurgitation is moderate . No evidence of tricuspid stenosis. Aortic Valve: The aortic valve was not well visualized. Aortic valve regurgitation is not visualized. Mild to moderate aortic valve sclerosis/calcification is present, without any evidence of aortic stenosis. Aortic valve peak gradient measures 8.8 mmHg. Pulmonic Valve: The pulmonic valve was normal in structure. Pulmonic valve regurgitation is trivial. No evidence of pulmonic stenosis. Aorta: The aortic root is normal in size and structure. Venous: The inferior vena cava is normal in size  with greater than 50% respiratory variability, suggesting right atrial pressure of 3 mmHg. IAS/Shunts: No atrial level shunt detected by color flow Doppler.  LEFT VENTRICLE PLAX 2D LVIDd:         3.87 cm  Diastology LVIDs:         3.29 cm  LV e' medial:    7.72 cm/s LV PW:         1.10 cm  LV E/e' medial:  12.8 LV IVS:        1.16 cm  LV e' lateral:   9.36 cm/s LVOT diam:     1.50 cm  LV E/e' lateral: 10.5 LVOT Area:     1.77 cm  RIGHT VENTRICLE RV Mid diam:    4.01 cm LEFT ATRIUM             Index LA diam:         4.30 cm 1.87 cm/m LA Vol (A2C):   71.9 ml 31.20 ml/m LA Vol (A4C):   86.1 ml 37.37 ml/m LA Biplane Vol: 78.9 ml 34.24 ml/m  AORTIC VALVE                PULMONIC VALVE AV Area (Vmax): 0.88 cm    PV Vmax:        0.79 m/s AV Vmax:        148.00 cm/s PV Peak grad:   2.5 mmHg AV Peak Grad:   8.8 mmHg    RVOT Peak grad: 1 mmHg LVOT Vmax:      73.30 cm/s  AORTA Ao Root diam: 2.60 cm MITRAL VALVE               TRICUSPID VALVE MV Area (PHT): 3.12 cm    TR Peak grad:   35.3 mmHg MV Decel Time: 243 msec    TR Vmax:        297.00 cm/s MV E velocity: 98.50 cm/s                            SHUNTS                            Systemic Diam: 1.50 cm Ida Rogue MD Electronically signed by Ida Rogue MD Signature Date/Time: 11/25/2020/2:56:01 PM    Final    ECHO TEE  Result Date: 12/05/2020    TRANSESOPHOGEAL ECHO REPORT   Patient Name:   MARKELA WEE Date of Exam: 12/05/2020 Medical Rec #:  [id number]         Height:       67.0 in Accession #:    [id number]        Weight:       279.1 lb Date of Birth:  09/07/1951         BSA:          2.330 m Patient Age:    89 years          BP:           105/56 mmHg Patient Gender: F                 HR:           115 bpm. Exam Location:  [facility] Procedure: Transesophageal Echo, Cardiac Doppler and Color Doppler Indications:     Atrial Fibrillation 427.31  History:         Patient has prior history of Echocardiogram examinations,  most                  recent 11/25/2020. Stroke; Risk Factors:Hypertension and                  Diabetes.  Sonographer:     Sherrie Sport RDCS (AE) Referring Phys:  [id number] CHRISTOPHER END Diagnosing Phys: Harrell Gave End MD PROCEDURE: After discussion of the risks and benefits of a TEE, an informed consent was obtained from the patient. The transesophogeal probe was passed without difficulty through the esophogus of the patient. Local oropharyngeal anesthetic was provided with viscous lidocaine. Sedation performed by different physician. The patient was  monitored while under deep sedation. Image quality was good. The patient developed no complications during the procedure. IMPRESSIONS  1. Left ventricular ejection fraction, by estimation, is 20 to 25%. The left ventricle has severely decreased function. The left ventricle demonstrates global hypokinesis.  2. Right ventricular systolic function is severely reduced. The right ventricular size is normal.  3. There is a round, pendunculated mass arising from the interatrial septum. Considerations include thrombus, myxoma, and vegetation; given the clinical history, thrombus is most likely. No definite atrial appendage thrombus is seen, though sluggish flow and shadowing from adjacent structures may obscure a thombus. A left atrial/left atrial appendage thrombus was detected.  4. A small pericardial effusion is present. The pericardial effusion is anterior to the right ventricle.  5. The mitral valve is normal in structure. Mild mitral valve regurgitation.  6. Tricuspid valve regurgitation is mild to moderate.  7. The aortic valve is tricuspid. There is moderate calcification of the aortic valve. There is moderate thickening of the aortic valve. Aortic valve regurgitation is not visualized. FINDINGS  Left Ventricle: Left ventricular ejection fraction, by estimation, is 20 to 25%. The left ventricle has severely decreased function. The left ventricle demonstrates global hypokinesis. The left ventricular internal cavity size was normal in size. Right Ventricle: The right ventricular size is normal. Right ventricular systolic function is severely reduced. Left Atrium: There is a round, pendunculated mass arising from the interatrial septum. Considerations include thrombus, myxoma, and vegetation; given the clinical history, thrombus is most likely. No definite atrial appendage thrombus is seen, though sluggish flow and shadowing from adjacent structures may obscure a thombus. A left atrial/left atrial appendage thrombus  was detected. Pericardium: A small pericardial effusion is present. The pericardial effusion is anterior to the right ventricle. Mitral Valve: The mitral valve is normal in structure. Mild mitral valve regurgitation. Tricuspid Valve: The tricuspid valve is normal in structure. Tricuspid valve regurgitation is mild to moderate. Aortic Valve: The aortic valve is tricuspid. There is moderate calcification of the aortic valve. There is moderate thickening of the aortic valve. Aortic valve regurgitation is not visualized. Pulmonic Valve: The pulmonic valve was not well visualized. Pulmonic valve regurgitation is not visualized. Aorta: The aortic root and ascending aorta are structurally normal, with no evidence of dilitation. IAS/Shunts: No atrial level shunt detected by color flow Doppler. Additional Comments: There is pleural effusion in the left lateral region. Nelva Bush MD Electronically signed by Nelva Bush MD Signature Date/Time: 12/05/2020/4:56:15 PM    Final       Subjective: Pt has no complaints.  Discharge Exam: There were no vitals filed for this visit. There were no vitals filed for this visit.  General: Pt is somewhat awake, chronically ill, nad Cardiovascular: irregular, s1/s2 Respiratory:poor respiratory effort Abdominal: Soft, NT, ND, bowel sounds + Extremities: +b/l edema    The  results of significant diagnostics from this hospitalization (including imaging, microbiology, ancillary and laboratory) are listed below for reference.     Microbiology: Recent Results (from the past 240 hour(s))  Culture, blood (single) w Reflex to ID Panel     Status: None (Preliminary result)   Collection Time: 12/05/20  4:12 PM   Specimen: BLOOD  Result Value Ref Range Status   Specimen Description BLOOD BLOOD RIGHT HAND  Final   Special Requests   Final    BOTTLES DRAWN AEROBIC ONLY Blood Culture results may not be optimal due to an inadequate volume of blood received in culture  bottles   Culture   Final    NO GROWTH 2 DAYS Performed at [facility], [address] [place], [place] [address]    Report Status PENDING  Incomplete     Labs: BNP (last 3 results) Recent Labs    09/13/20 1046  BNP 623.7*   Basic Metabolic Panel: Recent Labs  Lab 11/30/20 2350 12/01/20 0520 12/02/20 0607 12/03/20 0543 12/04/20 0638 12/04/20 0918 12/05/20 0644  NA 137  --  134* 133*  --   --  132*  K 4.2  --  3.9 4.2  --   --  3.3*  CL 101  --  96* 96*  --   --  95*  CO2 24  --  27 25  --   --  27  GLUCOSE 136*  --  140* 139*  --   --  180*  BUN 56*  --  38* 45*  --   --  34*  CREATININE 4.42*  --  3.14* 3.55*  --   --  2.87*  CALCIUM 8.0*  --  7.8* 7.8*  --   --  7.6*  MG  --  1.9 1.8  --  1.8  --   --   PHOS  --   --   --   --   --  4.0  --    Liver Function Tests: No results for input(s): AST, ALT, ALKPHOS, BILITOT, PROT, ALBUMIN in the last 168 hours. No results for input(s): LIPASE, AMYLASE in the last 168 hours. No results for input(s): AMMONIA in the last 168 hours. CBC: Recent Labs  Lab 11/30/20 2350 12/01/20 0520 12/02/20 0607 12/03/20 0543 12/05/20 0644  WBC 11.5* 9.3 10.1 8.6 10.3  HGB 9.5* 8.8* 8.2* 8.7* 8.3*  HCT 30.3* 28.4* 26.3* 28.8* 27.1*  MCV 79.5* 79.6* 80.2 81.1 79.9*  PLT 202 203 209 221 232   Cardiac Enzymes: No results for input(s): CKTOTAL, CKMB, CKMBINDEX, TROPONINI in the last 168 hours. BNP: Invalid input(s): POCBNP CBG: Recent Labs  Lab 12/06/20 0803 12/06/20 1131 12/06/20 1620 12/06/20 2106 12/07/20 0751  GLUCAP 83 119* 105* 93 89   D-Dimer No results for input(s): DDIMER in the last 72 hours. Hgb A1c No results for input(s): HGBA1C in the last 72 hours. Lipid Profile No results for input(s): CHOL, HDL, LDLCALC, TRIG, CHOLHDL, LDLDIRECT in the last 72 hours. Thyroid function studies No results for input(s): TSH, T4TOTAL, T3FREE, THYROIDAB in the last 72 hours.  Invalid input(s): FREET3 Anemia work  up No results for input(s): VITAMINB12, FOLATE, FERRITIN, TIBC, IRON, RETICCTPCT in the last 72 hours. Urinalysis    Component Value Date/Time   COLORURINE YELLOW (A) 09/13/2020 0539   APPEARANCEUR HAZY (A) 09/13/2020 0539   LABSPEC 1.018 09/13/2020 0539   PHURINE 5.0 09/13/2020 0539   GLUCOSEU NEGATIVE 09/13/2020 0539   HGBUR MODERATE (A)  09/13/2020 [place] 09/13/2020 [place] 09/13/2020 0539   PROTEINUR 100 (A) 09/13/2020 0539   NITRITE POSITIVE (A) 09/13/2020 0539   LEUKOCYTESUR SMALL (A) 09/13/2020 0539   Sepsis Labs Invalid input(s): PROCALCITONIN,  WBC,  LACTICIDVEN Microbiology Recent Results (from the past 240 hour(s))  Culture, blood (single) w Reflex to ID Panel     Status: None (Preliminary result)   Collection Time: 12/05/20  4:12 PM   Specimen: BLOOD  Result Value Ref Range Status   Specimen Description BLOOD BLOOD RIGHT HAND  Final   Special Requests   Final    BOTTLES DRAWN AEROBIC ONLY Blood Culture results may not be optimal due to an inadequate volume of blood received in culture bottles   Culture   Final    NO GROWTH 2 DAYS Performed at [facility], [address], [place], [place] [address]    Report Status PENDING  Incomplete     Time coordinating discharge: Over 30 minutes  SIGNED:   Nolberto Hanlon, MD  [facility] 12/07/2020, 5:59 PM Pager   If 7PM-7AM, please contact night-coverage

## 2020-12-07 NOTE — Discharge Summary (Deleted)
  The note originally documented on this encounter has been moved the the encounter in which it belongs.  

## 2020-12-07 NOTE — Progress Notes (Signed)
Progress Note  Patient Name: Brooke Hopkins Date of Encounter: 12/07/2020  [facility] Cardiologist: Kate Sable, MD   Subjective   No complaints today Husband at bedside, reports they are leaving, on hospice She does not want more aggressive intervention Husband tearful  Inpatient Medications    Scheduled Meds: . apixaban  5 mg Oral BID  . vitamin C  500 mg Oral BID  . Chlorhexidine Gluconate Cloth  6 each Topical Q0600  . Chlorhexidine Gluconate Cloth  6 each Topical Once  . clopidogrel  75 mg Oral Daily  . collagenase   Topical Daily  . feeding supplement (NEPRO CARB STEADY)  237 mL Oral TID BM  . insulin aspart  0-6 Units Subcutaneous TID WC  . metoprolol tartrate  37.5 mg Oral BID  . multivitamin  1 tablet Oral QHS  . multivitamin with minerals  1 tablet Oral Daily  . polyethylene glycol  17 g Oral Daily   Continuous Infusions: . sodium chloride Stopped (12/05/20 1532)   PRN Meds: sodium chloride, acetaminophen, acetaminophen, HYDROcodone-acetaminophen, morphine injection, ondansetron (ZOFRAN) IV     Vital Signs    Vitals:   12/06/20 1132 12/06/20 1603 12/06/20 2027 12/07/20 0750  BP: (!) 114/56 101/66 110/61 114/72  Pulse: (!) 118 (!) 108 (!) 113 93  Resp: 14 14 20 14   Temp: 98.1 F (36.7 C) 97.9 F (36.6 C) 98 F (36.7 C) 97.8 F (36.6 C)  TempSrc: Oral Oral Oral Oral  SpO2: 96% 100% 100% 100%  Weight:      Height:        Intake/Output Summary (Last 24 hours) at 12/07/2020 1647 Last data filed at 12/07/2020 1050 Gross per 24 hour  Intake 120 ml  Output 200 ml  Net -80 ml   Last 3 Weights 12/06/2020 12/05/2020 12/05/2020  Weight (lbs) 234 lb 279 lb 1.6 oz 279 lb  Weight (kg) 106.142 kg 126.6 kg 126.554 kg  Some encounter information is confidential and restricted. Go to Review Flowsheets activity to see all data.      Telemetry    Off telemetry- Personally Reviewed  ECG     - Personally Reviewed  Physical Exam   GEN: No  acute distress.   Neck: No JVD Cardiac: Irregularly irregular, rapid no murmurs, rubs, or gallops.  Respiratory: Clear to auscultation bilaterally. GI: Soft, nontender, non-distended  MS: No edema; No deformity. Neuro:  Nonfocal  Psych: Normal affect   Labs    High Sensitivity Troponin:   Recent Labs  Lab 11/24/20 0934 11/24/20 1128 11/24/20 1338 11/24/20 1528  TROPONINIHS 194* 190* 205* 212*      Chemistry Recent Labs  Lab 12/02/20 0607 12/03/20 0543 12/05/20 0644  NA 134* 133* 132*  K 3.9 4.2 3.3*  CL 96* 96* 95*  CO2 27 25 27   GLUCOSE 140* 139* 180*  BUN 38* 45* 34*  CREATININE 3.14* 3.55* 2.87*  CALCIUM 7.8* 7.8* 7.6*  GFRNONAA 15* 13* 17*  ANIONGAP 11 12 10      Hematology Recent Labs  Lab 12/02/20 0607 12/03/20 0543 12/05/20 0644  WBC 10.1 8.6 10.3  RBC 3.28* 3.55* 3.39*  HGB 8.2* 8.7* 8.3*  HCT 26.3* 28.8* 27.1*  MCV 80.2 81.1 79.9*  MCH 25.0* 24.5* 24.5*  MCHC 31.2 30.2 30.6  RDW 17.2* 16.9* 16.6*  PLT 209 221 232    BNPNo results for input(s): BNP, PROBNP in the last 168 hours.   DDimer No results for input(s): DDIMER in the last 168  hours.   Radiology    No results found.  Cardiac Studies     Patient Profile     69 y.o. 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, unable to exclude thrombus -Very difficult to control rate even on aggressive IV infusions including amiodarone -She has been relatively asymptomatic despite rapid rates -Other medication such as beta-blockers, diltiazem limited secondary to hypotension On Eliquis Lasix could be used as needed for any shortness of breath for comfort   Elevated troponin Demand ischemia in the setting of rapid atrial fibrillation On Eliquis  Acute renal failure Secondary to ATN  Transaminitis Shock liver, tachycardia hypotension Statin on hold Depending on hemodynamics  History of  strokes Currently on Eliquis  PAD On Eliquis, previously on aspirin Plavix statin Prior intervention lower extremities Status post prior left pop/AT stenting Followed by vascular surgery 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

## 2020-12-10 LAB — CULTURE, BLOOD (SINGLE): Culture: NO GROWTH

## 2020-12-30 DEATH — deceased

## 2021-01-16 DIAGNOSIS — M869 Osteomyelitis, unspecified: Secondary | ICD-10-CM

## 2021-10-28 IMAGING — DX DG TIBIA/FIBULA PORT 2V*L*
3 series · 3 of 3 positions shown · non-contrast
Comparison: X-ray 10/13/2015

CLINICAL DATA: Foot wound infection of the anterior distal
tibia-fibula, left heel, and left calf

EXAM:
LEFT FOOT - 2 VIEW; PORTABLE LEFT TIBIA AND FIBULA - 2 VIEW

[tibia ap (1 of 2)]
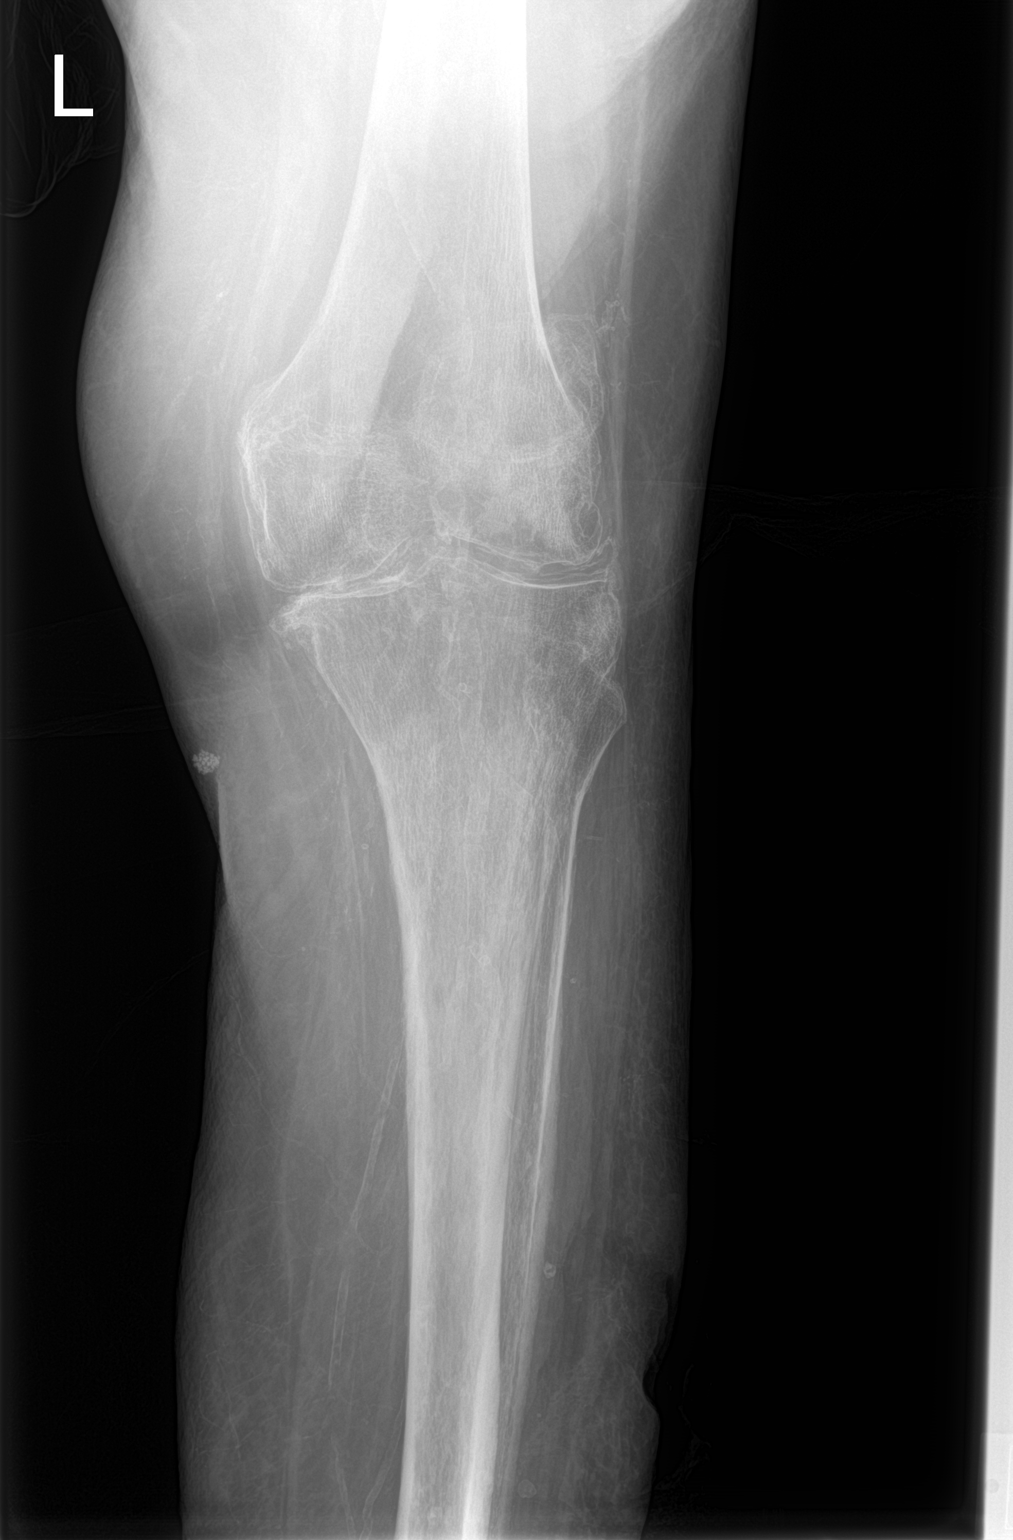

[tibia ap (2 of 2)]
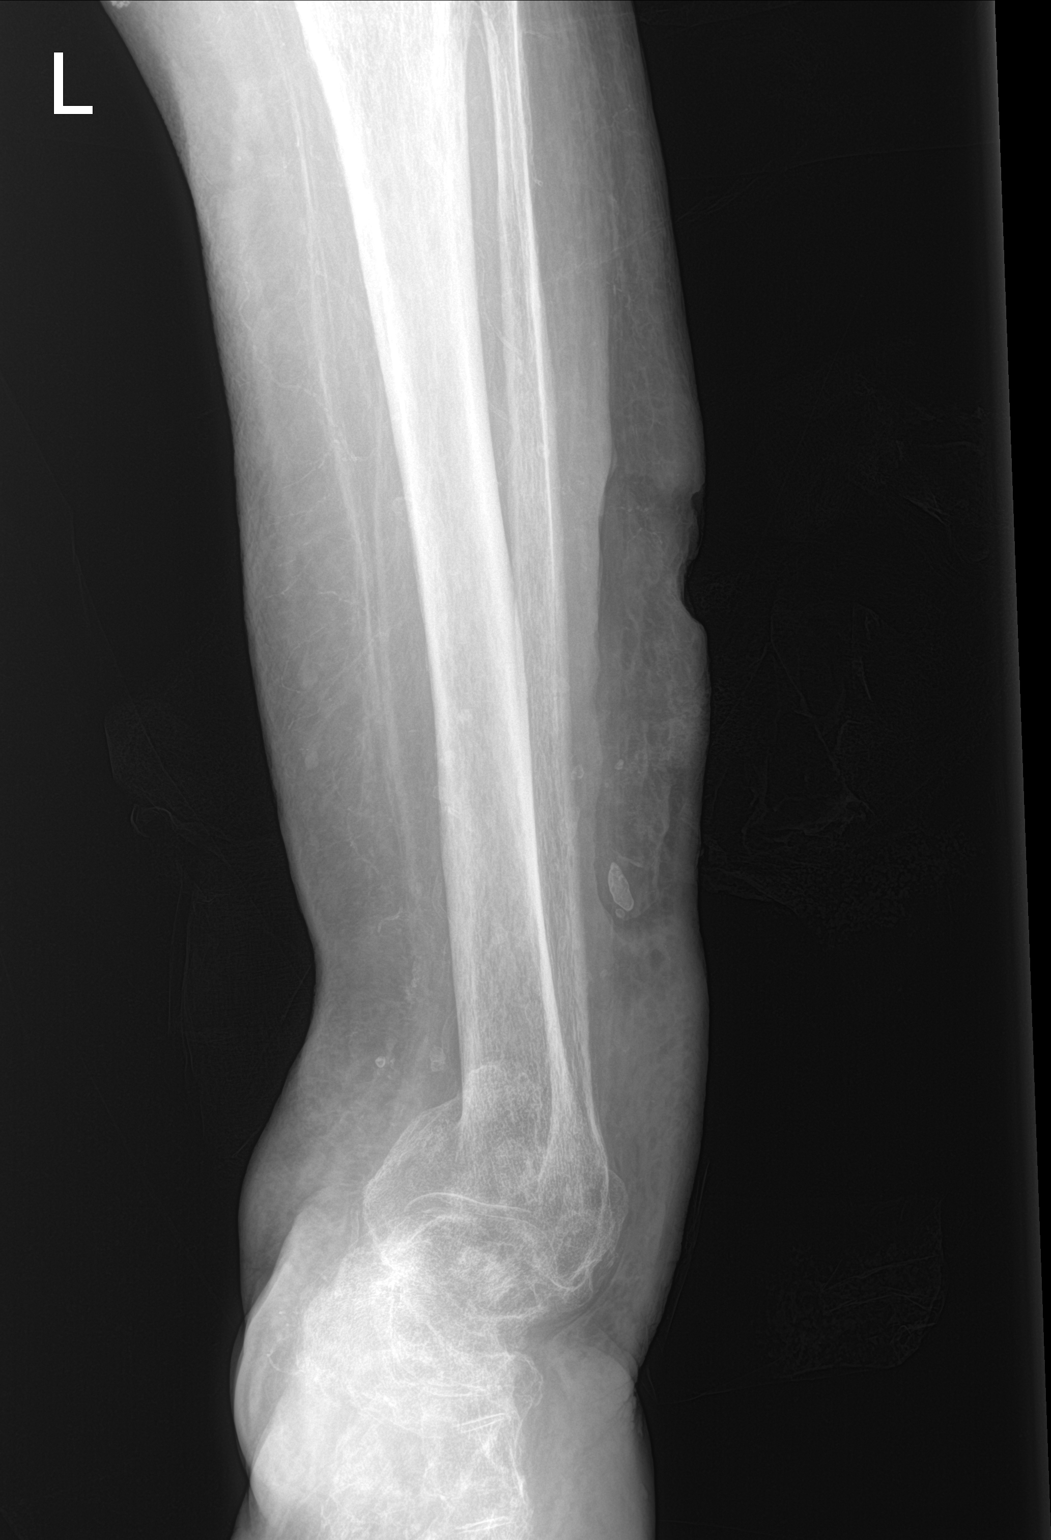

[tibia lat]
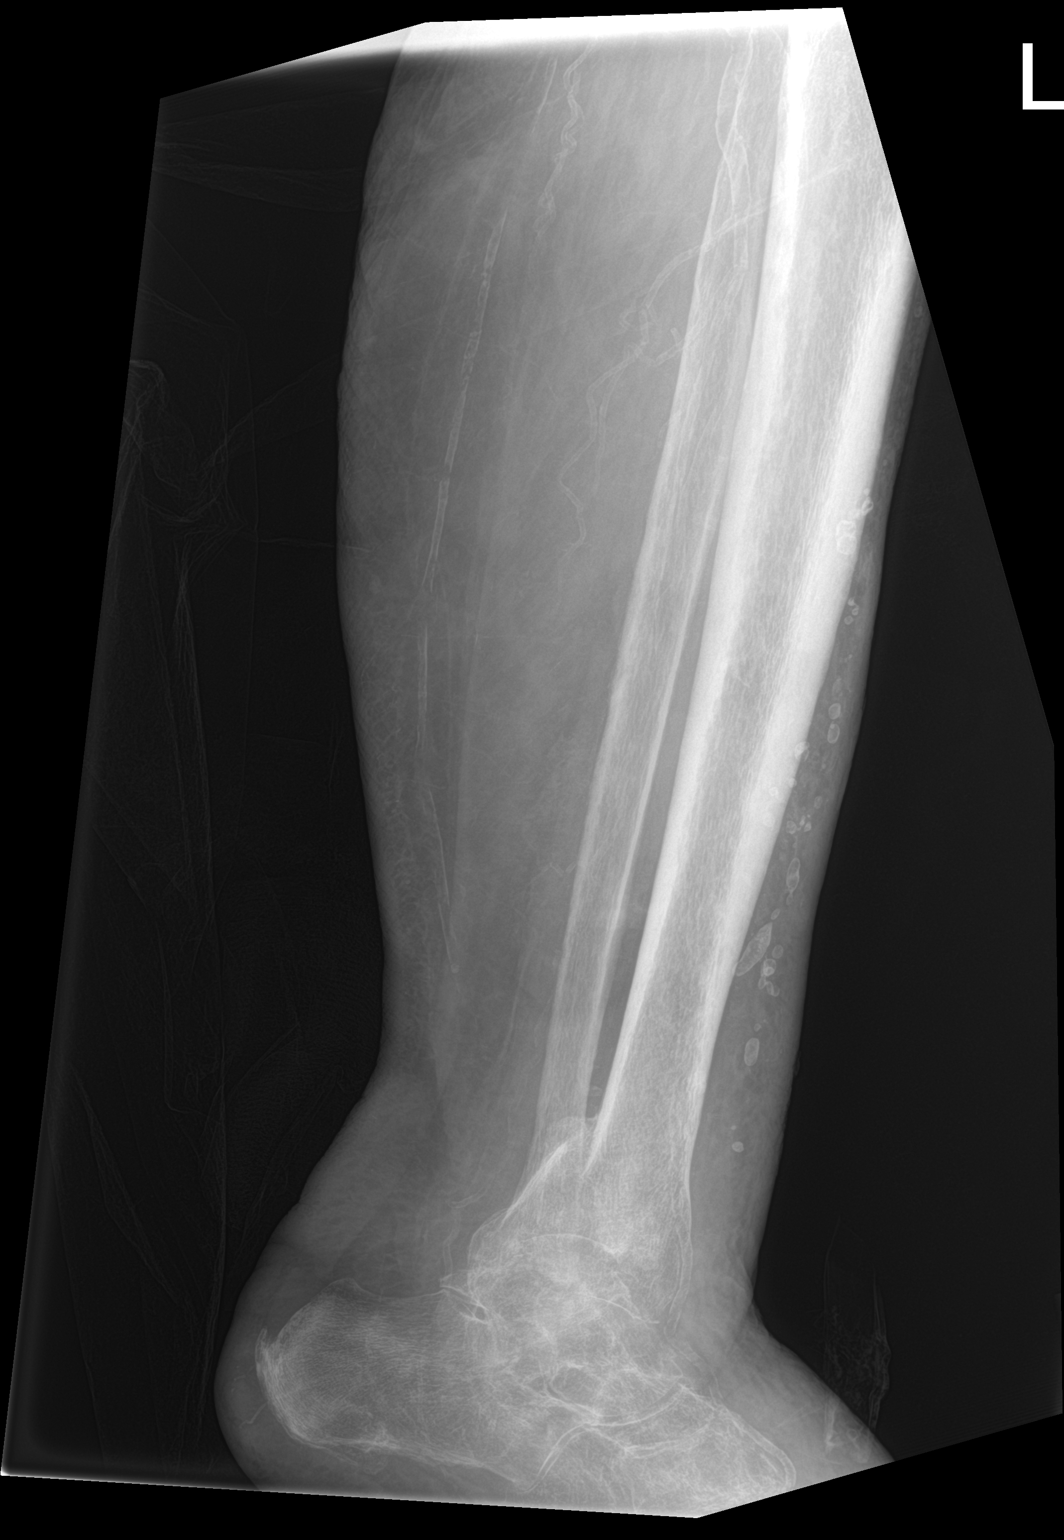

[3 of 3 positions shown; findings below may reference images not displayed]

FINDINGS: Bones are markedly demineralized. There is a chronic appearing
fracture deformity of the distal tibial metaphysis with varus
angulation. Severe tricompartmental degenerative changes of the left
knee. No knee joint effusion. No acute fracture is identified. No
evidence of cortical destruction or periostitis to suggest
osteomyelitis. Soft tissue ulcerations at the posterolateral aspect
of the calf and plantar heel. Marked soft tissue swelling of the
foot. Severe extensive atherosclerotic calcifications.
IMPRESSION: 1. Soft tissue ulcerations of the calf and plantar heel. No
radiographic evidence of osteomyelitis.
2. Severe tricompartmental degenerative changes of the left knee.
3. Marked soft tissue swelling of the foot.
4. Chronic appearing fracture deformity of the distal tibial
metaphysis with varus angulation.

## 2021-10-28 IMAGING — DX DG FOOT 2V*L*
2 series · 2 of 2 positions shown · non-contrast
Comparison: X-ray 10/13/2015

CLINICAL DATA: Foot wound infection of the anterior distal
tibia-fibula, left heel, and left calf

EXAM:
LEFT FOOT - 2 VIEW; PORTABLE LEFT TIBIA AND FIBULA - 2 VIEW

[foot ap]
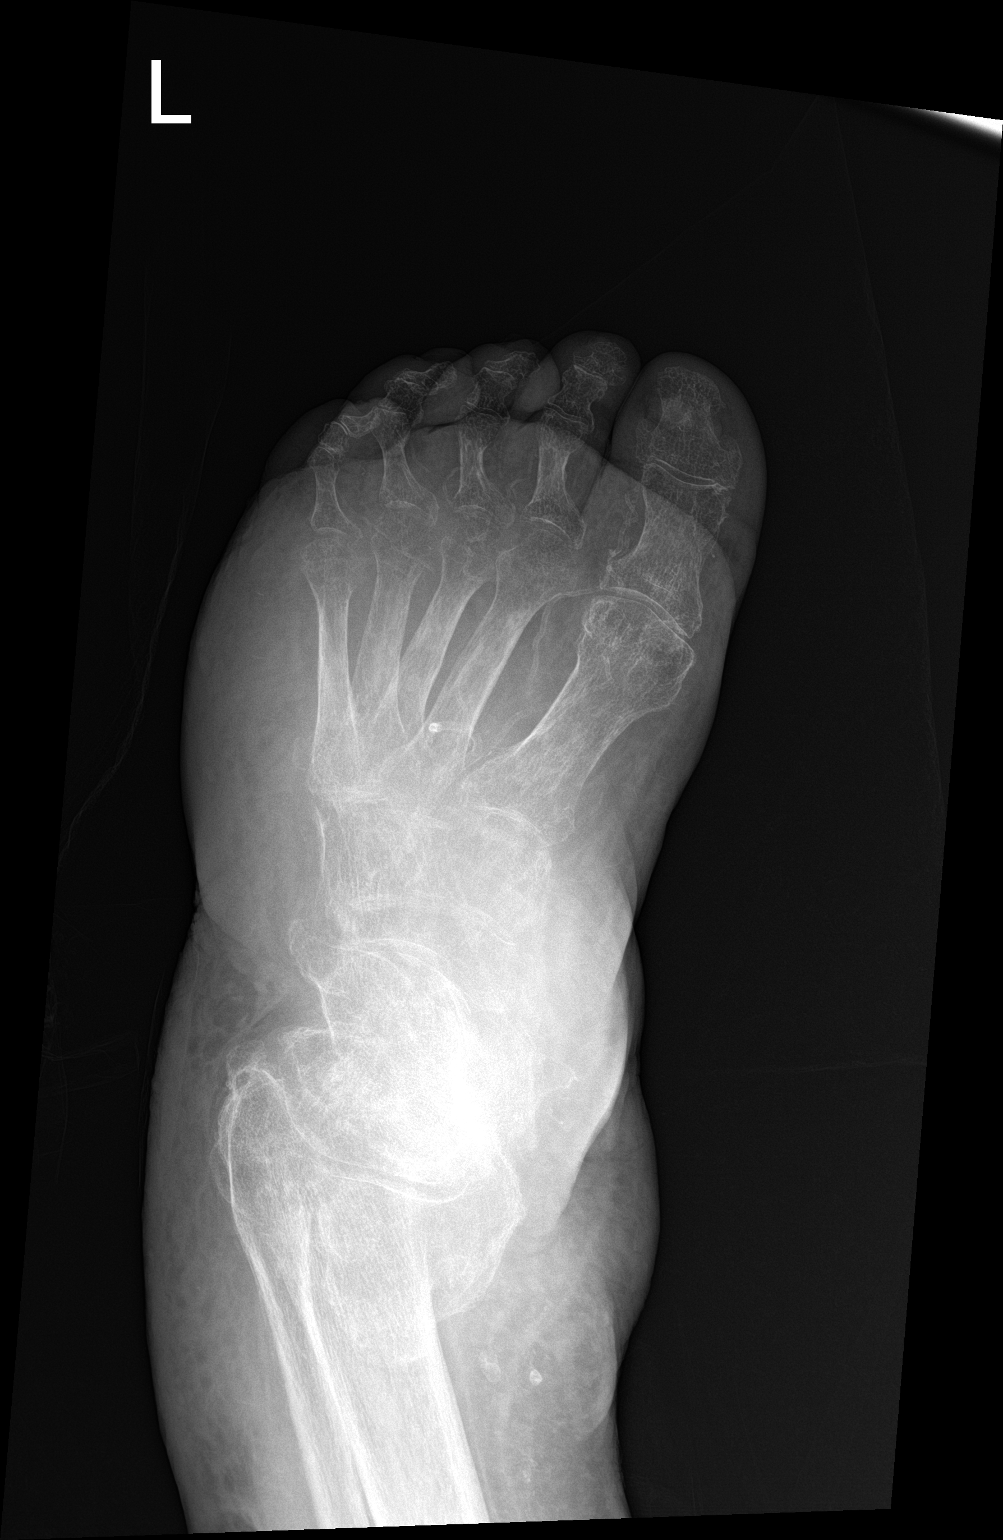

[foot lat]
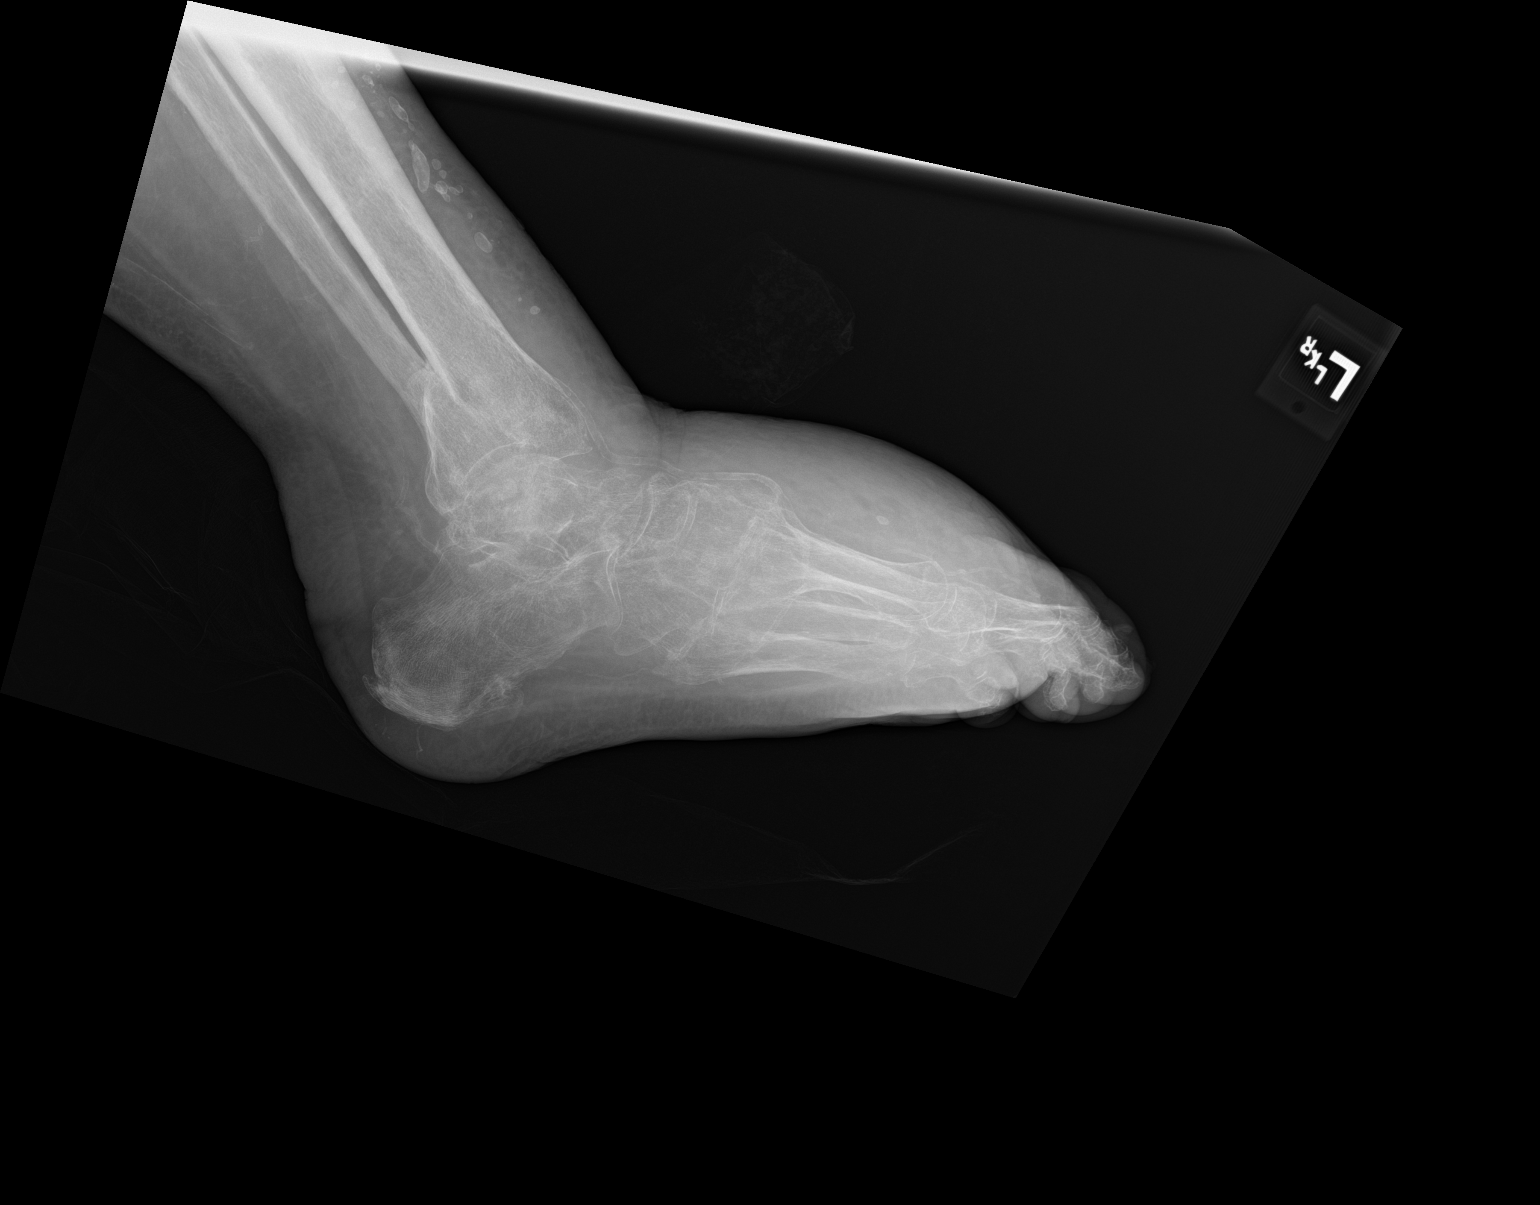

[2 of 2 positions shown; findings below may reference images not displayed]

FINDINGS: Bones are markedly demineralized. There is a chronic appearing
fracture deformity of the distal tibial metaphysis with varus
angulation. Severe tricompartmental degenerative changes of the left
knee. No knee joint effusion. No acute fracture is identified. No
evidence of cortical destruction or periostitis to suggest
osteomyelitis. Soft tissue ulcerations at the posterolateral aspect
of the calf and plantar heel. Marked soft tissue swelling of the
foot. Severe extensive atherosclerotic calcifications.
IMPRESSION: 1. Soft tissue ulcerations of the calf and plantar heel. No
radiographic evidence of osteomyelitis.
2. Severe tricompartmental degenerative changes of the left knee.
3. Marked soft tissue swelling of the foot.
4. Chronic appearing fracture deformity of the distal tibial
metaphysis with varus angulation.

## 2022-01-23 IMAGING — DX DG CHEST 1V PORT
1 series · 1 of 1 positions shown · non-contrast
Comparison: Chest x-ray dated November 11, 2016.

CLINICAL DATA: New onset atrial fibrillation.  Shortness of breath.

EXAM:
PORTABLE CHEST 1 VIEW

[chest ap]
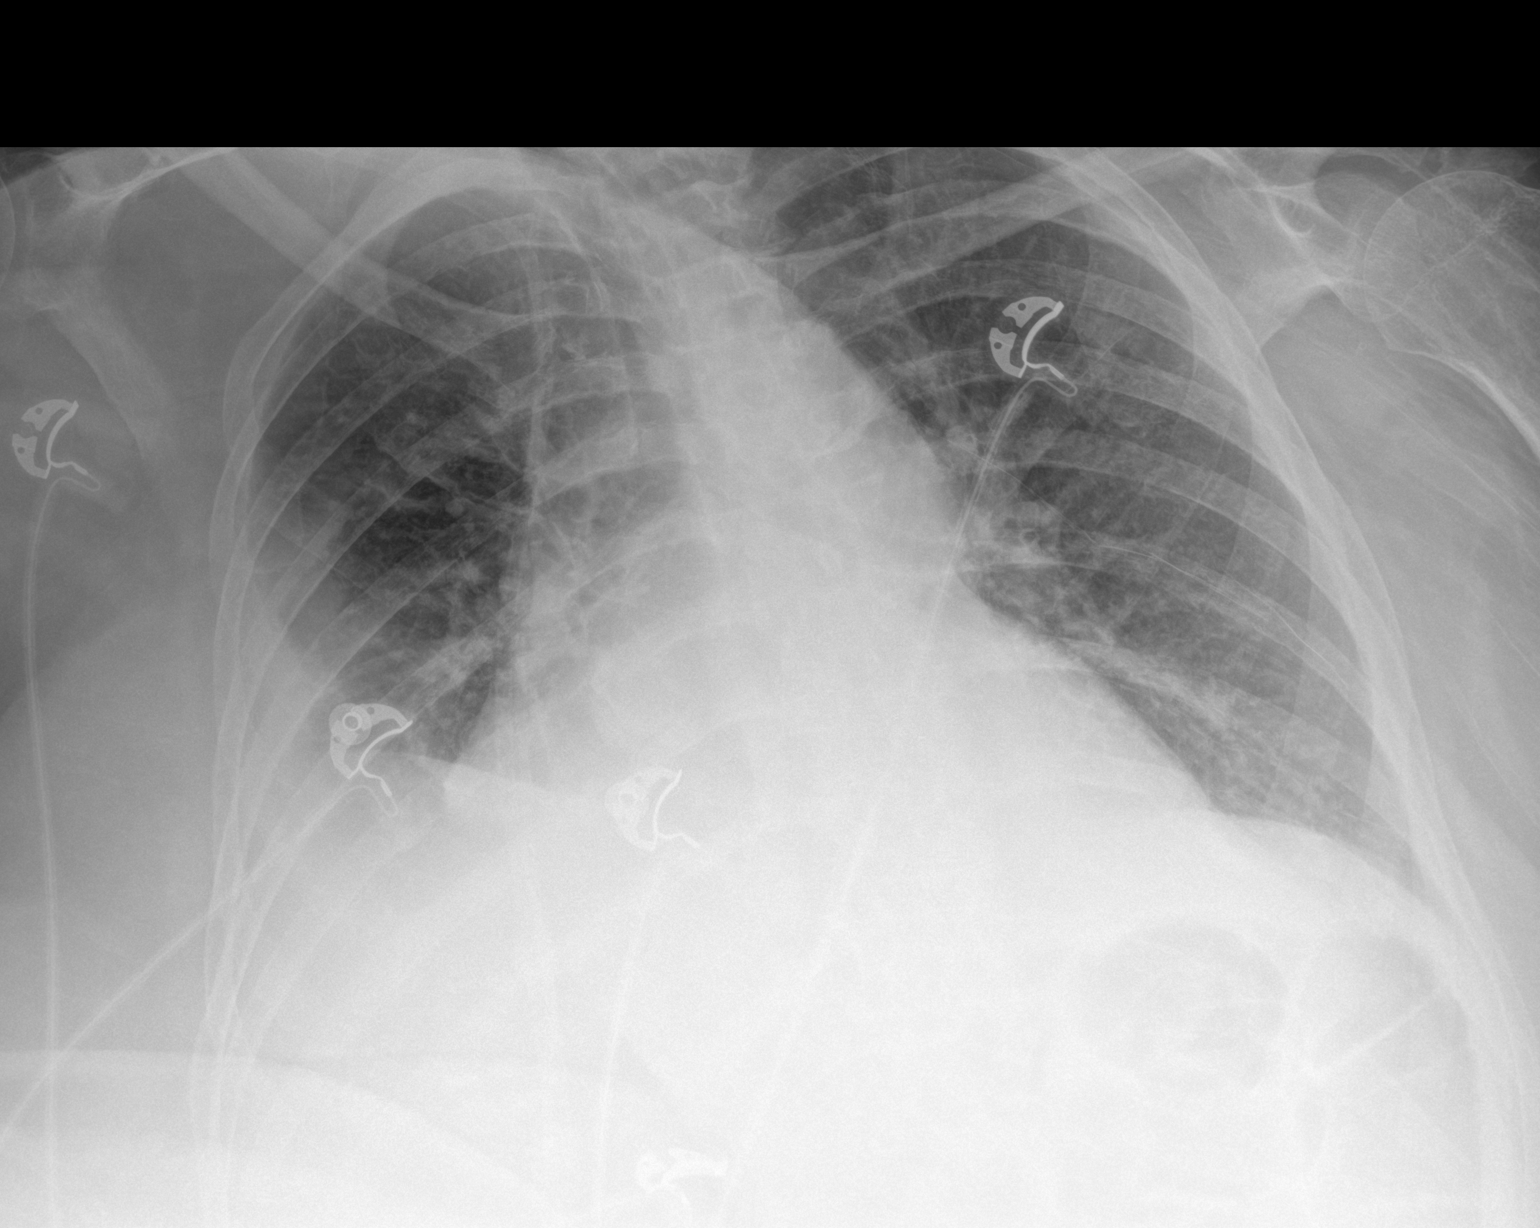

[1 of 1 positions shown; findings below may reference images not displayed]

FINDINGS: The patient is rotated to the right, limiting evaluation. Stable
cardiomediastinal silhouette with heart size at the upper limits of
normal. Pulmonary vascular congestion. Possible small right pleural
effusion. No consolidation or pneumothorax. No acute osseous
abnormality.
IMPRESSION: 1. Limited study due to patient rotation. Pulmonary vascular
congestion and possible small right pleural effusion.

## 2022-01-27 IMAGING — DX DG CHEST 1V PORT
1 series · 1 of 1 positions shown · non-contrast
Comparison: November 24, 2020.

CLINICAL DATA: Central line placement.

EXAM:
PORTABLE CHEST 1 VIEW

[chest ap]
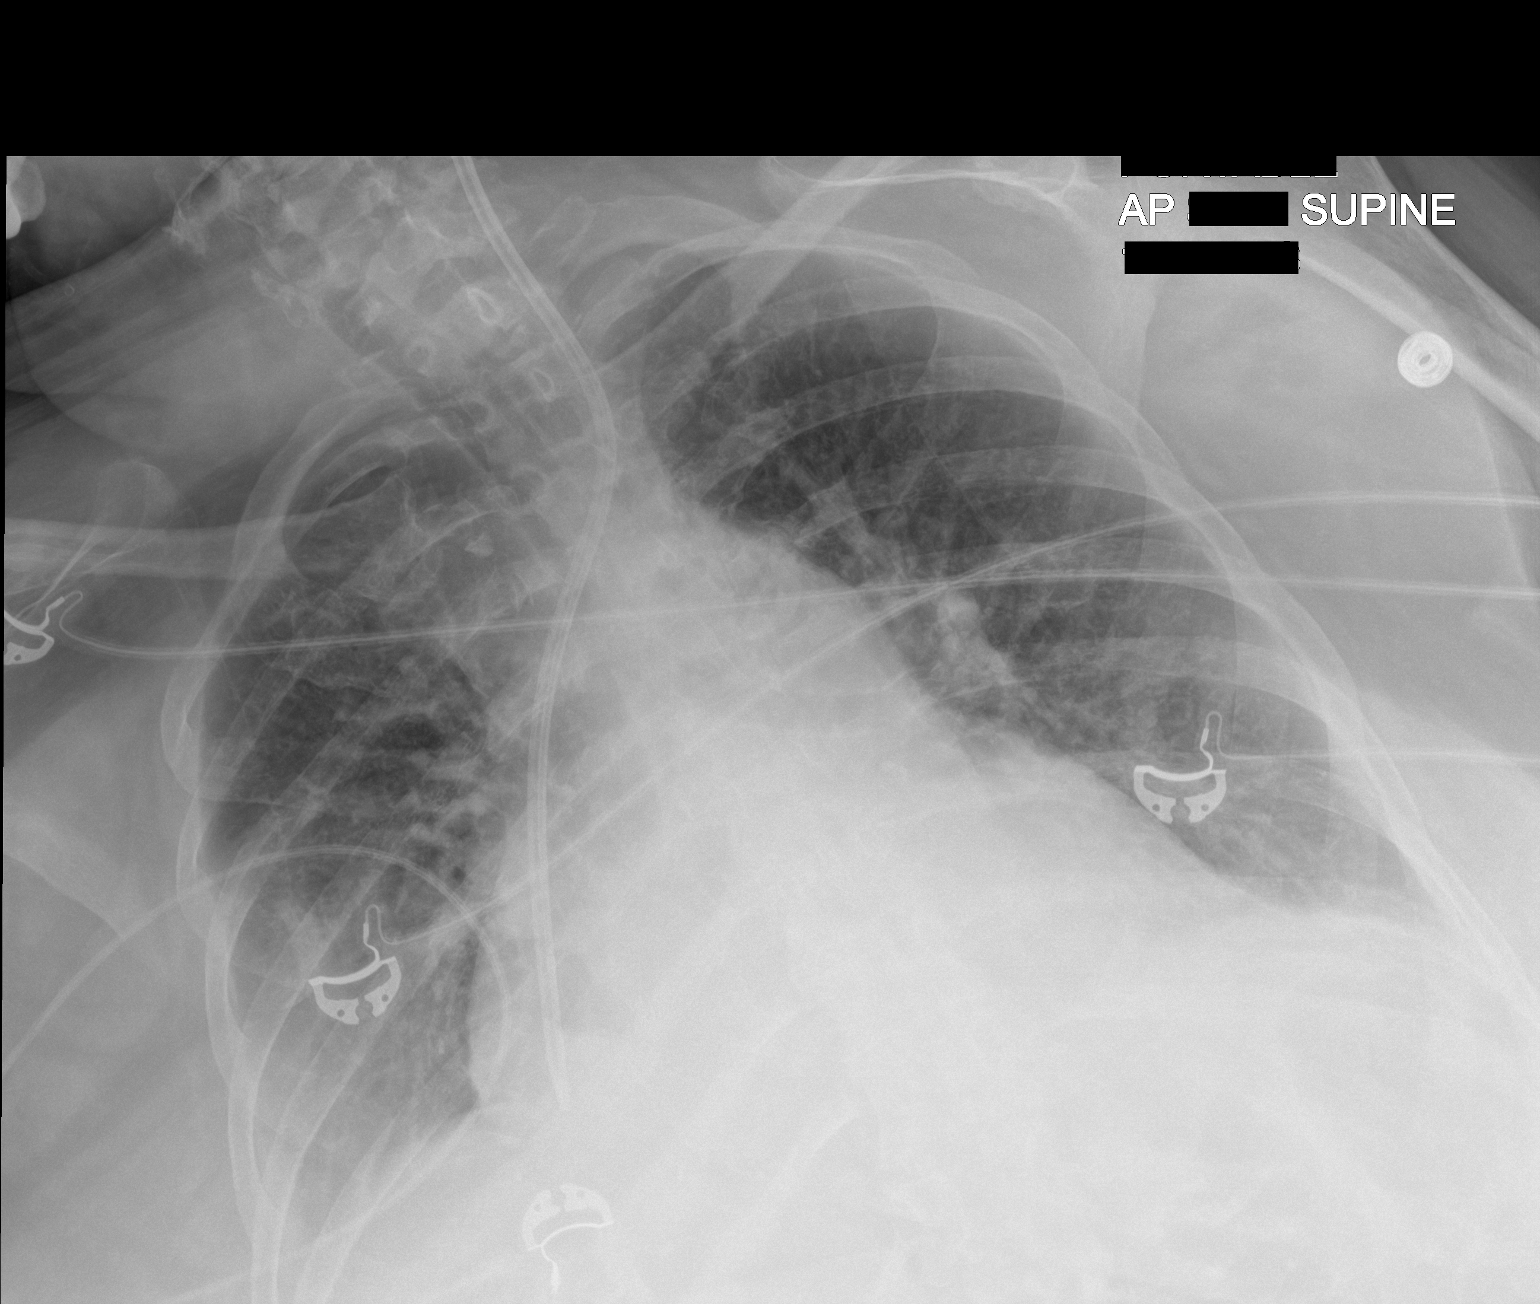

[1 of 1 positions shown; findings below may reference images not displayed]

FINDINGS: Stable cardiomegaly. Interval placement of left internal jugular
catheter with distal tip in expected position of the right atrium.
No pneumothorax is noted. Mild bibasilar subsegmental atelectasis is
noted. Small right pleural effusion may be present. Bony thorax is
unremarkable.
IMPRESSION: Interval placement of left internal jugular catheter with distal tip
in expected position of the right atrium. No pneumothorax is noted.
Mild bibasilar subsegmental atelectasis is noted.
